# Patient Record
Sex: Male | Born: 1960 | Race: Black or African American | Hispanic: No | Marital: Married | Smoking: Never smoker
Health system: Southern US, Community
[De-identification: ages and names within clinical notes are randomized; demographics above are authoritative.]

## PROBLEM LIST (undated history)

## (undated) DIAGNOSIS — M549 Dorsalgia, unspecified: Secondary | ICD-10-CM

## (undated) DIAGNOSIS — G8929 Other chronic pain: Secondary | ICD-10-CM

## (undated) DIAGNOSIS — I350 Nonrheumatic aortic (valve) stenosis: Secondary | ICD-10-CM

## (undated) DIAGNOSIS — R519 Headache, unspecified: Secondary | ICD-10-CM

## (undated) DIAGNOSIS — E7800 Pure hypercholesterolemia, unspecified: Secondary | ICD-10-CM

## (undated) DIAGNOSIS — I10 Essential (primary) hypertension: Secondary | ICD-10-CM

## (undated) DIAGNOSIS — K219 Gastro-esophageal reflux disease without esophagitis: Secondary | ICD-10-CM

## (undated) DIAGNOSIS — G4730 Sleep apnea, unspecified: Secondary | ICD-10-CM

## (undated) DIAGNOSIS — I509 Heart failure, unspecified: Secondary | ICD-10-CM

## (undated) DIAGNOSIS — I209 Angina pectoris, unspecified: Secondary | ICD-10-CM

## (undated) DIAGNOSIS — R0600 Dyspnea, unspecified: Secondary | ICD-10-CM

## (undated) DIAGNOSIS — Z9289 Personal history of other medical treatment: Secondary | ICD-10-CM

## (undated) DIAGNOSIS — M1990 Unspecified osteoarthritis, unspecified site: Secondary | ICD-10-CM

## (undated) DIAGNOSIS — R51 Headache: Secondary | ICD-10-CM

## (undated) DIAGNOSIS — R0609 Other forms of dyspnea: Secondary | ICD-10-CM

## (undated) HISTORY — PX: TONSILLECTOMY: SUR1361

---

## 2002-04-04 HISTORY — PX: KNEE ARTHROSCOPY: SUR90

## 2009-12-31 ENCOUNTER — Emergency Department (HOSPITAL_COMMUNITY): Admission: EM | Admit: 2009-12-31 | Discharge: 2010-01-01 | Payer: Self-pay | Admitting: Emergency Medicine

## 2010-06-03 HISTORY — PX: MULTIPLE TOOTH EXTRACTIONS: SHX2053

## 2010-06-17 LAB — POCT I-STAT, CHEM 8
BUN: 14 mg/dL (ref 6–23)
Calcium, Ion: 1.23 mmol/L (ref 1.12–1.32)
Chloride: 107 meq/L (ref 96–112)
Creatinine, Ser: 0.8 mg/dL (ref 0.4–1.5)
Glucose, Bld: 99 mg/dL (ref 70–99)
HCT: 46 % (ref 39.0–52.0)
Hemoglobin: 15.6 g/dL (ref 13.0–17.0)
Potassium: 3.3 mEq/L — ABNORMAL LOW (ref 3.5–5.1)
Sodium: 142 meq/L (ref 135–145)
TCO2: 25 mmol/L (ref 0–100)

## 2010-08-03 DIAGNOSIS — G4730 Sleep apnea, unspecified: Secondary | ICD-10-CM

## 2010-08-03 HISTORY — DX: Sleep apnea, unspecified: G47.30

## 2010-09-10 ENCOUNTER — Ambulatory Visit (HOSPITAL_BASED_OUTPATIENT_CLINIC_OR_DEPARTMENT_OTHER): Payer: Medicaid Other | Attending: Internal Medicine

## 2010-09-10 DIAGNOSIS — G4733 Obstructive sleep apnea (adult) (pediatric): Secondary | ICD-10-CM | POA: Insufficient documentation

## 2010-09-11 DIAGNOSIS — G4733 Obstructive sleep apnea (adult) (pediatric): Secondary | ICD-10-CM

## 2010-09-12 NOTE — Procedures (Signed)
[name], [name]                  ACCOUNT NO.:  [id number]  MEDICAL RECORD NO.:  [id number]          PATIENT TYPE:  OUT  LOCATION:  SLEEP CENTER                 FACILITY:  [facility]  PHYSICIAN:  Clinton D. Maple Hudson, MD, FCCP, FACPDATE OF BIRTH:  DATE OF STUDY:  09/10/2010                           NOCTURNAL POLYSOMNOGRAM  REFERRING PHYSICIAN:  Fleet Contras, M.D.  INDICATION FOR STUDY:  Hypersomnia with sleep apnea.  EPWORTH SLEEPINESS SCORE:  9/24, BMI 31, weight 203 pounds, height 68 inches, neck 16.5 inches.  MEDICATIONS:  Charted and reviewed.  SLEEP ARCHITECTURE:  Total sleep time 326.5 minutes with sleep efficiency 89.8%.  Stage I was 5.1%, stage II 73.8%, stage III absent, REM 21.1% of total sleep time.  Sleep latency 32 minutes, REM latency 54.5 minutes, awake after sleep onset 6 minutes, arousal index 5.9.  BEDTIME MEDICATION:  None.  RESPIRATORY DATA:  Apnea/hypopnea index (AHI) 10.8 per hour.  A total of 59 events were scored including one obstructive apneas, 6 central apneas, 52 hypopneas.  Events were not positional.  REM AHI 25.2 per hour.  RDI 12.7 per hour.  There were insufficient numbers of early events to permit application of CPAP titration by split protocol requirements on this study night.  OXYGEN DATA:  Moderately loud snoring with oxygen desaturation to a nadir of 82% and a mean oxygen saturation through the study of 92% on room air.  CARDIAC DATA:  Normal sinus rhythm.  MOVEMENT-PARASOMNIA:  No significant movement disorder.  No bathroom trips.  IMPRESSIONS-RECOMMENDATIONS: 1. Mild obstructive sleep apnea/hypopnea syndrome, apnea/hypopnea     index 10.8 per hour with non-positional events, moderately loud     snoring, and oxygen desaturation to a nadir of 82% with mean of 92%     on room air through the study. 2. There were insufficient early events to meet requirements for     initiation of continuous positive airway pressure     split protocol  titration on the study night.  Consider return for a     dedicated continuous positive airway pressure titration study or     evaluate for alternative management as clinically indicated.     Clinton D. Maple Hudson, MD, [facility], FACP Diplomate, Biomedical engineer of Sleep Medicine Electronically Signed    CDY/MEDQ  D:  09/11/2010 11:48:08  T:  09/12/2010 00:49:12  Job:  [id number]

## 2011-03-05 HISTORY — PX: COLONOSCOPY: SHX174

## 2011-03-05 HISTORY — PX: UPPER GASTROINTESTINAL ENDOSCOPY: SHX188

## 2011-04-05 DIAGNOSIS — Z9289 Personal history of other medical treatment: Secondary | ICD-10-CM

## 2011-04-05 DIAGNOSIS — I350 Nonrheumatic aortic (valve) stenosis: Secondary | ICD-10-CM

## 2011-04-05 HISTORY — DX: Personal history of other medical treatment: Z92.89

## 2011-04-05 HISTORY — DX: Nonrheumatic aortic (valve) stenosis: I35.0

## 2011-08-26 ENCOUNTER — Emergency Department (HOSPITAL_COMMUNITY): Payer: Medicaid Other

## 2011-08-26 ENCOUNTER — Observation Stay (HOSPITAL_COMMUNITY)
Admission: EM | Admit: 2011-08-26 | Discharge: 2011-08-27 | Disposition: A | Discharge status: Home / Self Care | Payer: Medicaid Other | Attending: Family Medicine | Admitting: Family Medicine

## 2011-08-26 ENCOUNTER — Encounter (HOSPITAL_COMMUNITY): Payer: Self-pay | Admitting: Physical Medicine and Rehabilitation

## 2011-08-26 DIAGNOSIS — I10 Essential (primary) hypertension: Secondary | ICD-10-CM | POA: Insufficient documentation

## 2011-08-26 DIAGNOSIS — K219 Gastro-esophageal reflux disease without esophagitis: Secondary | ICD-10-CM

## 2011-08-26 DIAGNOSIS — Z9119 Patient's noncompliance with other medical treatment and regimen: Secondary | ICD-10-CM | POA: Insufficient documentation

## 2011-08-26 DIAGNOSIS — E785 Hyperlipidemia, unspecified: Secondary | ICD-10-CM

## 2011-08-26 DIAGNOSIS — Z91199 Patient's noncompliance with other medical treatment and regimen due to unspecified reason: Secondary | ICD-10-CM | POA: Insufficient documentation

## 2011-08-26 DIAGNOSIS — M109 Gout, unspecified: Secondary | ICD-10-CM | POA: Insufficient documentation

## 2011-08-26 DIAGNOSIS — R0789 Other chest pain: Principal | ICD-10-CM | POA: Insufficient documentation

## 2011-08-26 DIAGNOSIS — R079 Chest pain, unspecified: Secondary | ICD-10-CM | POA: Diagnosis present

## 2011-08-26 HISTORY — DX: Pure hypercholesterolemia, unspecified: E78.00

## 2011-08-26 HISTORY — DX: Essential (primary) hypertension: I10

## 2011-08-26 HISTORY — DX: Dyspnea, unspecified: R06.00

## 2011-08-26 HISTORY — DX: Sleep apnea, unspecified: G47.30

## 2011-08-26 HISTORY — DX: Other forms of dyspnea: R06.09

## 2011-08-26 HISTORY — DX: Angina pectoris, unspecified: I20.9

## 2011-08-26 LAB — POCT I-STAT TROPONIN I: Troponin i, poc: 0 ng/mL (ref 0.00–0.08)

## 2011-08-26 LAB — COMPREHENSIVE METABOLIC PANEL
AST: 27 U/L (ref 0–37)
Albumin: 3.7 g/dL (ref 3.5–5.2)
BUN: 9 mg/dL (ref 6–23)
Calcium: 9.7 mg/dL (ref 8.4–10.5)
Creatinine, Ser: 0.83 mg/dL (ref 0.50–1.35)
Total Bilirubin: 0.4 mg/dL (ref 0.3–1.2)
Total Protein: 7.2 g/dL (ref 6.0–8.3)

## 2011-08-26 LAB — CBC
HCT: 42 % (ref 39.0–52.0)
Hemoglobin: 14.4 g/dL (ref 13.0–17.0)
MCH: 24.7 pg — ABNORMAL LOW (ref 26.0–34.0)
MCV: 72 fL — ABNORMAL LOW (ref 78.0–100.0)
Platelets: 185 10*3/uL (ref 150–400)
RBC: 5.83 MIL/uL — ABNORMAL HIGH (ref 4.22–5.81)
WBC: 6 10*3/uL (ref 4.0–10.5)

## 2011-08-26 MED ORDER — NITROGLYCERIN 0.4 MG SL SUBL
0.4000 mg | SUBLINGUAL_TABLET | SUBLINGUAL | Status: DC | PRN
  Administered 2011-08-26: 0.4 mg via SUBLINGUAL

## 2011-08-26 MED ORDER — MORPHINE SULFATE 4 MG/ML IJ SOLN: 4.0000 mg | Freq: Once | INTRAMUSCULAR | Status: DC

## 2011-08-26 MED ORDER — ASPIRIN 81 MG PO CHEW
324.0000 mg | CHEWABLE_TABLET | Freq: Once | ORAL | Status: AC
  Administered 2011-08-26: 324 mg via ORAL
  Filled 2011-08-26: qty 3
  Filled 2011-08-26: qty 1

## 2011-08-26 NOTE — ED Provider Notes (Signed)
Medical screening examination/treatment/procedure(s) were conducted as a shared visit with non-physician practitioner(s) and myself.  I personally evaluated the patient during the encounter On my exam this patient was feeling better. I saw the ECG and agree with the interpretation. The patient was admitted for further E/M.   Gerhard Munch, MD 08/26/11 [phone]

## 2011-08-26 NOTE — ED Notes (Addendum)
Pt. Reports SOB and chest pain x 1 day. "started at approx 1300. Pt reports N/V x1 day with 2 episodes. Pt. States chest pain feels like "pressure" with sharpness on the left side.  Pain is unchanged with palpation. Pt. Has hxt of hypertension. A.O. X 4 . Pt reports muscle spams "through whole body on Monday".

## 2011-08-26 NOTE — ED Notes (Signed)
Pt presents to department for evaluation of midsternal chest pain radiating to both arms. Onset this afternoon. 8/10 pain at the time, increases with deep breathing. Describes pain as "pressure" sensation. Also states SOB, N/V and diaphoresis. Pt is alert and oriented x4.

## 2011-08-26 NOTE — ED Provider Notes (Signed)
History     CSN: [id number]  Arrival date & time 08/26/11  1620   First MD Initiated Contact with Patient 08/26/11 1713     5:48 PM HPI Reports chest pain that began approximately one hour and 40 minutes ago. States pain woke him up from sleep at 1 PM. Describes pain as a substernal left sided chest pressure with radiation straight to his back. Associated with nausea, vomiting, shortness of breath, diaphoresis. Reports a history of hypertension. Denies history of CAD, smoking, hyperlipidemia, early family history of heart disease, blood clots, recent travel, surgery. Patient is a 51 y.o. male presenting with chest pain. The history is provided by the patient.  Chest Pain The chest pain began 3 - 5 hours ago. Chest pain occurs constantly. The chest pain is unchanged. The severity of the pain is severe. The quality of the pain is described as pressure-like. The pain radiates to the upper back. Primary symptoms include shortness of breath, nausea and vomiting. Pertinent negatives for primary symptoms include no fever, no fatigue, no syncope, no cough, no wheezing, no palpitations, no abdominal pain, no dizziness and no altered mental status.  Associated symptoms include diaphoresis.  Pertinent negatives for associated symptoms include no claudication, no lower extremity edema, no near-syncope and no numbness. He tried nothing for the symptoms. Risk factors include male gender.  His past medical history is significant for hypertension.  Pertinent negatives for past medical history include no arrhythmia, no CHF, no diabetes, no DVT, no hyperlipidemia, no MI and no PE.  Pertinent negatives for family medical history include: no early MI in family.     Past Medical History  Diagnosis Date  . Hypertension     No past surgical history on file.  History reviewed. No pertinent family history.  History  Substance Use Topics  . Smoking status: Never Smoker   . Smokeless tobacco: Not on file  .  Alcohol Use: No      Review of Systems  Constitutional: Positive for diaphoresis. Negative for fever and fatigue.  Respiratory: Positive for shortness of breath. Negative for cough and wheezing.   Cardiovascular: Positive for chest pain. Negative for palpitations, claudication, syncope and near-syncope.  Gastrointestinal: Positive for nausea and vomiting. Negative for abdominal pain.  Neurological: Negative for dizziness and numbness.  Psychiatric/Behavioral: Negative for altered mental status.  All other systems reviewed and are negative.    Allergies  Latex  Home Medications   Current Outpatient Rx  Name Route Sig Dispense Refill  . ALLOPURINOL 300 MG PO TABS Oral Take 300 mg by mouth daily.    Marland Kitchen AMLODIPINE BESYLATE 10 MG PO TABS Oral Take 10 mg by mouth daily.    Marland Kitchen LISINOPRIL-HYDROCHLOROTHIAZIDE 20-25 MG PO TABS Oral Take 1 tablet by mouth daily.    Marland Kitchen OMEPRAZOLE 20 MG PO CPDR Oral Take 40 mg by mouth daily.      BP 160/100  Pulse 72  Temp(Src) 98.1 F (36.7 C) (Oral)  Resp 16  SpO2 97%  Physical Exam  Constitutional: He is oriented to person, place, and time. He appears well-developed and well-nourished.  HENT:  Head: Normocephalic and atraumatic.  Eyes: Conjunctivae are normal. Pupils are equal, round, and reactive to light.  Neck: Normal range of motion. Neck supple.  Cardiovascular: Normal rate, regular rhythm and normal heart sounds.   Pulmonary/Chest: Effort normal and breath sounds normal.  Abdominal: Soft. Bowel sounds are normal.  Neurological: He is alert and oriented to person, place, and time.  Skin: Skin is warm and dry. No rash noted. No erythema. No pallor.  Psychiatric: He has a normal mood and affect. His behavior is normal.    ED Course  Procedures  Results for orders placed during the hospital encounter of 08/26/11  COMPREHENSIVE METABOLIC PANEL      Component Value Range   Sodium 140  135 - 145 (mEq/L)   Potassium 3.7  3.5 - 5.1 (mEq/L)    Chloride 104  96 - 112 (mEq/L)   CO2 24  19 - 32 (mEq/L)   Glucose, Bld 85  70 - 99 (mg/dL)   BUN 9  6 - 23 (mg/dL)   Creatinine, Ser 1.61  0.50 - 1.35 (mg/dL)   Calcium 9.7  8.4 - 09.6 (mg/dL)   Total Protein 7.2  6.0 - 8.3 (g/dL)   Albumin 3.7  3.5 - 5.2 (g/dL)   AST 27  0 - 37 (U/L)   ALT 37  0 - 53 (U/L)   Alkaline Phosphatase 66  39 - 117 (U/L)   Total Bilirubin 0.4  0.3 - 1.2 (mg/dL)   GFR calc non Af Amer >90  >90 (mL/min)   GFR calc Af Amer >90  >90 (mL/min)  CBC      Component Value Range   WBC 6.0  4.0 - 10.5 (K/uL)   RBC 5.83 (*) 4.22 - 5.81 (MIL/uL)   Hemoglobin 14.4  13.0 - 17.0 (g/dL)   HCT 04.5  40.9 - 81.1 (%)   MCV 72.0 (*) 78.0 - 100.0 (fL)   MCH 24.7 (*) 26.0 - 34.0 (pg)   MCHC 34.3  30.0 - 36.0 (g/dL)   RDW 91.4  78.2 - 95.6 (%)   Platelets 185  150 - 400 (K/uL)  POCT I-STAT TROPONIN I      Component Value Range   Troponin i, poc 0.00  0.00 - 0.08 (ng/mL)   Comment 3            Dg Chest 2 View  08/26/2011  *RADIOLOGY REPORT*  Clinical Data: Chest pain, shortness of breath, weakness, dry cough, hypertension  CHEST - 2 VIEW  Comparison: None.  Findings: Normal heart size, mediastinal contours, and pulmonary vascularity. Lungs clear. Bones unremarkable. No pneumothorax.  IMPRESSION: No acute abnormalities.  Original Report Authenticated By: Lollie Marrow, M.D.   Dg Chest 2v Repeat Same Day  08/26/2011  *RADIOLOGY REPORT*  Clinical Data: Chest pain, shortness of breath  CHEST - 2 VIEW SAME DAY  Comparison: 08/26/2011 at 1648 hours  Findings: Lungs are clear. No pleural effusion or pneumothorax.  Cardiomediastinal silhouette is within normal limits.  Visualized osseous structures are within normal limits.  IMPRESSION: Normal chest radiographs.  Original Report Authenticated By: Charline Bills, M.D.    ED ECG REPORT   Date: 08/26/2011  EKG Time: 10:05 PM  Rate: 80   Rhythm: normal sinus rhythm,  there are no previous tracings available for comparison  Axis:  nml  Intervals:none  ST&T Change: early repol    MDM   10:03 PM Patient's pain is now a 0/10. Headache from nitroglycerin has resolved. The patient's chest pain was concerning for cardiac chest pain,  will admit for chest pain observation due to to lack for space in chest pain protocol in the CDU. Discussed with patient and family agree with plan  11:17 PM Spoke with Dr. Joneen Roach, [facility]. She will see the patient for admission of Chest pain.        Thomasene Lot, PA-C 08/26/11  2318 

## 2011-08-26 NOTE — ED Notes (Signed)
Patient is resting comfortably. 

## 2011-08-27 ENCOUNTER — Encounter (HOSPITAL_COMMUNITY): Payer: Self-pay | Admitting: General Practice

## 2011-08-27 DIAGNOSIS — E785 Hyperlipidemia, unspecified: Secondary | ICD-10-CM

## 2011-08-27 DIAGNOSIS — K219 Gastro-esophageal reflux disease without esophagitis: Secondary | ICD-10-CM

## 2011-08-27 DIAGNOSIS — Z9119 Patient's noncompliance with other medical treatment and regimen: Secondary | ICD-10-CM

## 2011-08-27 DIAGNOSIS — M109 Gout, unspecified: Secondary | ICD-10-CM

## 2011-08-27 DIAGNOSIS — Z91199 Patient's noncompliance with other medical treatment and regimen due to unspecified reason: Secondary | ICD-10-CM

## 2011-08-27 DIAGNOSIS — R079 Chest pain, unspecified: Secondary | ICD-10-CM | POA: Diagnosis present

## 2011-08-27 DIAGNOSIS — I10 Essential (primary) hypertension: Secondary | ICD-10-CM

## 2011-08-27 LAB — BASIC METABOLIC PANEL
BUN: 10 mg/dL (ref 6–23)
CO2: 22 mEq/L (ref 19–32)
Chloride: 102 mEq/L (ref 96–112)
Creatinine, Ser: 0.8 mg/dL (ref 0.50–1.35)

## 2011-08-27 LAB — LIPID PANEL
HDL: 29 mg/dL — ABNORMAL LOW (ref >39)
LDL Cholesterol: 114 mg/dL — ABNORMAL HIGH (ref 0–99)
Triglycerides: 241 mg/dL — ABNORMAL HIGH (ref <150)

## 2011-08-27 LAB — CBC
HCT: 41.6 % (ref 39.0–52.0)
HCT: 42.2 % (ref 39.0–52.0)
MCH: 24.2 pg — ABNORMAL LOW (ref 26.0–34.0)
MCHC: 34.9 g/dL (ref 30.0–36.0)
MCHC: 34.1 g/dL (ref 30.0–36.0)
MCV: 71 fL — ABNORMAL LOW (ref 78.0–100.0)
MCV: 70.9 fL — ABNORMAL LOW (ref 78.0–100.0)
RDW: 13.4 % (ref 11.5–15.5)
RDW: 13.5 % (ref 11.5–15.5)

## 2011-08-27 LAB — CREATININE, SERUM: GFR calc non Af Amer: >90 mL/min (ref >90)

## 2011-08-27 LAB — TROPONIN I: Troponin I: <0.3 ng/mL (ref <0.30)

## 2011-08-27 MED ORDER — ONDANSETRON HCL 4 MG/2ML IJ SOLN: 4.0000 mg | Freq: Four times a day (QID) | INTRAMUSCULAR | Status: DC | PRN

## 2011-08-27 MED ORDER — SODIUM CHLORIDE 0.9 % IJ SOLN: 3.0000 mL | INTRAMUSCULAR | Status: DC | PRN

## 2011-08-27 MED ORDER — PANTOPRAZOLE SODIUM 40 MG PO TBEC
40.0000 mg | DELAYED_RELEASE_TABLET | Freq: Every day | ORAL | Status: DC
  Administered 2011-08-27: 40 mg via ORAL

## 2011-08-27 MED ORDER — LISINOPRIL 20 MG PO TABS
20.0000 mg | ORAL_TABLET | Freq: Every day | ORAL | Status: DC
  Administered 2011-08-27: 20 mg via ORAL
  Filled 2011-08-27: qty 1

## 2011-08-27 MED ORDER — ONDANSETRON HCL 4 MG PO TABS: 4.0000 mg | ORAL_TABLET | Freq: Four times a day (QID) | ORAL | Status: DC | PRN

## 2011-08-27 MED ORDER — ALUM & MAG HYDROXIDE-SIMETH 200-200-20 MG/5ML PO SUSP: 30.0000 mL | Freq: Four times a day (QID) | ORAL | Status: DC | PRN

## 2011-08-27 MED ORDER — LISINOPRIL-HYDROCHLOROTHIAZIDE 20-25 MG PO TABS: 1.0000 | ORAL_TABLET | Freq: Every day | ORAL | Status: DC

## 2011-08-27 MED ORDER — ENOXAPARIN SODIUM 40 MG/0.4ML ~~LOC~~ SOLN
40.0000 mg | SUBCUTANEOUS | Status: DC
  Filled 2011-08-27: qty 0.4

## 2011-08-27 MED ORDER — METOPROLOL TARTRATE 1 MG/ML IV SOLN: 5.0000 mg | Freq: Four times a day (QID) | INTRAVENOUS | Status: DC | PRN

## 2011-08-27 MED ORDER — PANTOPRAZOLE SODIUM 40 MG PO TBEC: 40.0000 mg | DELAYED_RELEASE_TABLET | Freq: Every day | ORAL | Status: DC

## 2011-08-27 MED ORDER — ASPIRIN 325 MG PO TABS
325.0000 mg | ORAL_TABLET | Freq: Every day | ORAL | Status: DC
  Administered 2011-08-27: 325 mg via ORAL
  Filled 2011-08-27: qty 1

## 2011-08-27 MED ORDER — CLONIDINE HCL 0.1 MG PO TABS
0.1000 mg | ORAL_TABLET | Freq: Four times a day (QID) | ORAL | Status: DC | PRN
  Filled 2011-08-27: qty 1

## 2011-08-27 MED ORDER — AMLODIPINE BESYLATE 10 MG PO TABS
10.0000 mg | ORAL_TABLET | Freq: Every day | ORAL | Status: DC
  Administered 2011-08-27: 10 mg via ORAL
  Filled 2011-08-27: qty 1

## 2011-08-27 MED ORDER — SODIUM CHLORIDE 0.9 % IJ SOLN
3.0000 mL | Freq: Two times a day (BID) | INTRAMUSCULAR | Status: DC
  Administered 2011-08-27: 3 mL via INTRAVENOUS

## 2011-08-27 MED ORDER — ALLOPURINOL 300 MG PO TABS
300.0000 mg | ORAL_TABLET | Freq: Every day | ORAL | Status: DC
  Administered 2011-08-27: 300 mg via ORAL
  Filled 2011-08-27: qty 1

## 2011-08-27 MED ORDER — CLONIDINE HCL 0.1 MG PO TABS: 0.1000 mg | ORAL_TABLET | Freq: Two times a day (BID) | ORAL | Status: DC

## 2011-08-27 MED ORDER — ACETAMINOPHEN 325 MG PO TABS: 650.0000 mg | ORAL_TABLET | Freq: Four times a day (QID) | ORAL | Status: DC | PRN

## 2011-08-27 MED ORDER — HYDROCODONE-ACETAMINOPHEN 5-325 MG PO TABS: 1.0000 | ORAL_TABLET | ORAL | Status: DC | PRN

## 2011-08-27 MED ORDER — HYDROCHLOROTHIAZIDE 25 MG PO TABS
25.0000 mg | ORAL_TABLET | Freq: Every day | ORAL | Status: DC
  Administered 2011-08-27: 25 mg via ORAL
  Filled 2011-08-27: qty 1

## 2011-08-27 MED ORDER — ACETAMINOPHEN 650 MG RE SUPP: 650.0000 mg | Freq: Four times a day (QID) | RECTAL | Status: DC | PRN

## 2011-08-27 MED ORDER — ASPIRIN 325 MG PO TABS: 325.0000 mg | ORAL_TABLET | Freq: Every day | ORAL | Status: DC

## 2011-08-27 NOTE — Progress Notes (Signed)
[facility] Progress Note  08/27/2011  Subjective: Pt reports he is feeling much better.  He says that he has been having significant acid reflux that is not well controlled  Objective:  Vital signs in last 24 hours: Filed Vitals:   08/27/11 0053 08/27/11 0110 08/27/11 0600 08/27/11 0948  BP:  137/100 162/96 158/107  Pulse:  64 55   Temp: 98.8 F (37.1 C) 98.6 F (37 C) 98.2 F (36.8 C)   TempSrc: Oral     Resp:  14 15   Height:  5\' 9"  (1.753 m)    Weight:  87.6 kg (193 lb 2 oz)    SpO2:  96% 98%    Weight change:  No intake or output data in the 24 hours ending 08/27/11 1117 No results found for this basename: HGBA1C   Lab Results  Component Value Date   LDLCALC 114* 08/27/2011   CREATININE 0.80 08/27/2011    Review of Systems As above, otherwise all reviewed and reported negative  Physical Exam General - awake, no distress, cooperative HEENT - NCAT, MMM Lungs - BBS, CTA CV - normal s1, s2 sounds Abd - soft, nondistended, no masses, nontender Ext - no C/C/E  Lab Results: Results for orders placed during the hospital encounter of 08/26/11 (from the past 24 hour(s))  COMPREHENSIVE METABOLIC PANEL     Status: Normal   Collection Time   08/26/11  5:41 PM      Component Value Range   Sodium 140  135 - 145 (mEq/L)   Potassium 3.7  3.5 - 5.1 (mEq/L)   Chloride 104  96 - 112 (mEq/L)   CO2 24  19 - 32 (mEq/L)   Glucose, Bld 85  70 - 99 (mg/dL)   BUN 9  6 - 23 (mg/dL)   Creatinine, Ser 4.09  0.50 - 1.35 (mg/dL)   Calcium 9.7  8.4 - 81.1 (mg/dL)   Total Protein 7.2  6.0 - 8.3 (g/dL)   Albumin 3.7  3.5 - 5.2 (g/dL)   AST 27  0 - 37 (U/L)   ALT 37  0 - 53 (U/L)   Alkaline Phosphatase 66  39 - 117 (U/L)   Total Bilirubin 0.4  0.3 - 1.2 (mg/dL)   GFR calc non Af Amer >90  >90 (mL/min)   GFR calc Af Amer >90  >90 (mL/min)  CBC     Status: Abnormal   Collection Time   08/26/11  5:41 PM      Component Value Range   WBC 6.0  4.0 - 10.5 (K/uL)   RBC 5.83 (*)  4.22 - 5.81 (MIL/uL)   Hemoglobin 14.4  13.0 - 17.0 (g/dL)   HCT 91.4  78.2 - 95.6 (%)   MCV 72.0 (*) 78.0 - 100.0 (fL)   MCH 24.7 (*) 26.0 - 34.0 (pg)   MCHC 34.3  30.0 - 36.0 (g/dL)   RDW 21.3  08.6 - 57.8 (%)   Platelets 185  150 - 400 (K/uL)  POCT I-STAT TROPONIN I     Status: Normal   Collection Time   08/26/11  8:06 PM      Component Value Range   Troponin i, poc 0.00  0.00 - 0.08 (ng/mL)   Comment 3           POCT I-STAT TROPONIN I     Status: Normal   Collection Time   08/26/11 11:38 PM      Component Value Range   Troponin i, poc 0.00  0.00 - 0.08 (ng/mL)   Comment 3           TROPONIN I     Status: Normal   Collection Time   08/27/11  2:40 AM      Component Value Range   Troponin I <0.30  <0.30 (ng/mL)  CBC     Status: Abnormal   Collection Time   08/27/11  2:40 AM      Component Value Range   WBC 6.4  4.0 - 10.5 (K/uL)   RBC 5.86 (*) 4.22 - 5.81 (MIL/uL)   Hemoglobin 14.5  13.0 - 17.0 (g/dL)   HCT 40.9  81.1 - 91.4 (%)   MCV 71.0 (*) 78.0 - 100.0 (fL)   MCH 24.7 (*) 26.0 - 34.0 (pg)   MCHC 34.9  30.0 - 36.0 (g/dL)   RDW 78.2  95.6 - 21.3 (%)   Platelets 188  150 - 400 (K/uL)  CREATININE, SERUM     Status: Normal   Collection Time   08/27/11  2:40 AM      Component Value Range   Creatinine, Ser 0.83  0.50 - 1.35 (mg/dL)   GFR calc non Af Amer >90  >90 (mL/min)   GFR calc Af Amer >90  >90 (mL/min)  LIPID PANEL     Status: Abnormal   Collection Time   08/27/11  5:35 AM      Component Value Range   Cholesterol 191  0 - 200 (mg/dL)   Triglycerides 086 (*) <150 (mg/dL)   HDL 29 (*) >57 (mg/dL)   Total CHOL/HDL Ratio 6.6     VLDL 48 (*) 0 - 40 (mg/dL)   LDL Cholesterol 846 (*) 0 - 99 (mg/dL)  BASIC METABOLIC PANEL     Status: Normal   Collection Time   08/27/11  5:35 AM      Component Value Range   Sodium 137  135 - 145 (mEq/L)   Potassium 3.9  3.5 - 5.1 (mEq/L)   Chloride 102  96 - 112 (mEq/L)   CO2 22  19 - 32 (mEq/L)   Glucose, Bld 85  70 - 99 (mg/dL)    BUN 10  6 - 23 (mg/dL)   Creatinine, Ser 9.62  0.50 - 1.35 (mg/dL)   Calcium 9.7  8.4 - 95.2 (mg/dL)   GFR calc non Af Amer >90  >90 (mL/min)   GFR calc Af Amer >90  >90 (mL/min)  CBC     Status: Abnormal   Collection Time   08/27/11  5:35 AM      Component Value Range   WBC 6.5  4.0 - 10.5 (K/uL)   RBC 5.95 (*) 4.22 - 5.81 (MIL/uL)   Hemoglobin 14.4  13.0 - 17.0 (g/dL)   HCT 84.1  32.4 - 40.1 (%)   MCV 70.9 (*) 78.0 - 100.0 (fL)   MCH 24.2 (*) 26.0 - 34.0 (pg)   MCHC 34.1  30.0 - 36.0 (g/dL)   RDW 02.7  25.3 - 66.4 (%)   Platelets 184  150 - 400 (K/uL)  TROPONIN I     Status: Normal   Collection Time   08/27/11  8:19 AM      Component Value Range   Troponin I <0.30  <0.30 (ng/mL)    Micro Results: No results found for this or any previous visit (from the past 240 hour(s)).  Medications:  Scheduled Meds:   . allopurinol  300 mg Oral Daily  . amLODipine  10 mg Oral  Daily  . aspirin  324 mg Oral Once  . aspirin  325 mg Oral Daily  . enoxaparin  40 mg Subcutaneous Q24H  . lisinopril  20 mg Oral Daily   And  . hydrochlorothiazide  25 mg Oral Daily  .  morphine injection  4 mg Intravenous Once  . pantoprazole  40 mg Oral Q1200  . sodium chloride  3 mL Intravenous Q12H  . DISCONTD: lisinopril-hydrochlorothiazide  1 tablet Oral Daily   Continuous Infusions:  PRN Meds:.acetaminophen, acetaminophen, alum & mag hydroxide-simeth, cloNIDine, HYDROcodone-acetaminophen, metoprolol, nitroGLYCERIN, ondansetron (ZOFRAN) IV, ondansetron, sodium chloride  Assessment/Plan: Atypical CP  - complete rule out, waiting on last set of enzymes  GERD - continue protonix  HTN - continue meds  Possible DC later today   LOS: 1 day   Jaelen Soth 08/27/2011, 11:17 AM   Cleora Fleet, MD, CDE, FAAFP [facility] [place], [place]  [phone]

## 2011-08-27 NOTE — Discharge Summary (Signed)
Physician Discharge Summary  Patient ID: [name] MRN: [id number] DOB/AGE: 06/19/1960 51 y.o.  Admit date: 08/26/2011 Discharge date: 08/27/2011  Discharge Diagnoses:   *Chest pain  History of noncompliance with medical treatment  Hypertension  Dyslipidemia  GERD (gastroesophageal reflux disease)  Gout  Discharged Condition: good  Hospital Course:  CHEST PAIN - Pt had 3 sets of cardiac enzymes and ruled out for myocardial infarction.   HYPERLIPIDEMIA - pt had not been taking his statin meds regularly, found to have LDL of 114, triglycerides 409, HDL 29 - encouraged to take statin med and follow up with PCP  HTN - suboptimally controlled, added clonidine 0.1 mg po BID, resume home BP meds, follow up with PCP.   GERD - significant symptoms, suboptimally controlled (poor compliance with omeprazole) and pt can't afford nexium which worked much better.. Recommended take omeprazole bid and call and get appointment with [facility] for follow up.  Pt had an EGD 1 year ago but he had been on nexium and symptoms much better controlled at that time.    GOUT - stable  Discharge Exam: Blood pressure 145/95, pulse 72, temperature 98.3 F (36.8 C), temperature source Oral, resp. rate 18, height 5\' 9"  (1.753 m), weight 87.6 kg (193 lb 2 oz), SpO2 97.00%.   Disposition: Home with wife  Discharge Orders    Future Orders Please Complete By Expires   Increase activity slowly        Medication List  As of 08/27/2011  3:00 PM   STOP taking these medications         omeprazole 20 MG capsule         TAKE these medications         allopurinol 300 MG tablet   Commonly known as: ZYLOPRIM   Take 300 mg by mouth daily.      amLODipine 10 MG tablet   Commonly known as: NORVASC   Take 10 mg by mouth daily.      aspirin 325 MG tablet   Take 1 tablet (325 mg total) by mouth daily.      cloNIDine 0.1 MG tablet   Commonly known as: CATAPRES   Take 1 tablet (0.1 mg total) by mouth 2 (two)  times daily.      lisinopril-hydrochlorothiazide 20-25 MG per tablet   Commonly known as: PRINZIDE,ZESTORETIC   Take 1 tablet by mouth daily.      pantoprazole 40 MG tablet   Commonly known as: PROTONIX   Take 1 tablet (40 mg total) by mouth daily at 12 noon.           Follow-up Information    Follow up with AVBUERE,EDWIN A, MD. Schedule an appointment as soon as possible for a visit in 1 week. (HOSPITAL FOLLOW UP  BP CHECK)    Contact information:   [address] [place] [address] [phone]       Follow up with [facility] PLEASE CALL AND MAKE APPOINTMENT. Schedule an appointment as soon as possible for a visit in 2 weeks. (FOLLOW UP ACID REFLUX)         35 mins spent preparing discharge, writing work note, counseling patient.   SignedStandley Dakins MD 08/27/2011, 3:00 PM PAGER [phone]

## 2011-08-27 NOTE — H&P (Signed)
PCP:   Dr. Mitzie Na   Chief Complaint:  Chest pains   HPI: This is a 51 year old gentleman who comes in with complaints of bilateral chest pains which radiated down to the bilateral arms. He reports chest tightness, associated dizziness, lightheadedness, diaphoresis, nausea, vomiting, shortness of breath and palpitations. He reports the pain is worse with movements. He also reports back pain.  Pain in air resolve with morphine. He has a 84-month-old daughter whom he lifts frequently and a very physically strenuous job. He additionally reports a cough present for the last 2 years which he believes is related to his lisinopril, he states when doses is decrease the cough resolves. He has severe GERD and had a recent EGD and colonoscopy both of which were negative. Patient has hypertension and dyslipidemia, he is noncompliant with medication. The patient does have a history of recurrent chest pains, he reports his last stress test and left heart cath was done approximately 2 and half years ago both of which were negative. He had a prior heart cath approximately 6 years ago also negative. The patient does not have a significant family history of coronary artery disease, his uncle that died of congestive heart failure in his 11s. Here in the ER patient's blood pressures is elevated. He does admit to noncompliance with medication. History were provided both by patient and his wife. Patient's history is less reliable and reluctantly given. His wife is more straightforward.   Review of Systems: Positives bolded   anorexia, fever, weight loss,, vision loss, decreased hearing, hoarseness, chest pain, syncope, dyspnea on exertion, peripheral edema, balance deficits, hemoptysis, abdominal pain, melena, hematochezia, severe indigestion/heartburn, hematuria, incontinence, genital sores, muscle weakness, suspicious skin lesions, transient blindness, difficulty walking, depression, unusual weight change, abnormal bleeding,  enlarged lymph nodes, angioedema, and breast masses.  Past Medical History: Past Medical History  Diagnosis Date  . Hypertension   . High cholesterol   . Heart murmur     "leaky valve"  . Angina   . Exertional dyspnea   . Sleep apnea 08/2010    "not required to wear mask"  . Gout     "sometimes flares up even w/Allupurinol"   Past Surgical History  Procedure Date  . Upper gastrointestinal endoscopy 03/2011  . Colonoscopy 03/2011  . Knee arthroscopy 2004    "right; w/ligament repair in kneecap"  . Multiple tooth extractions 06/2010    full mouth  . Tonsillectomy     "I was a kid"    Medications: Prior to Admission medications   Medication Sig Start Date End Date Taking? Authorizing Provider  allopurinol (ZYLOPRIM) 300 MG tablet Take 300 mg by mouth daily.   Yes Historical Provider, MD  amLODipine (NORVASC) 10 MG tablet Take 10 mg by mouth daily.   Yes Historical Provider, MD  lisinopril-hydrochlorothiazide (PRINZIDE,ZESTORETIC) 20-25 MG per tablet Take 1 tablet by mouth daily.   Yes Historical Provider, MD  omeprazole (PRILOSEC) 20 MG capsule Take 40 mg by mouth daily.   Yes Historical Provider, MD    Allergies:   Allergies  Allergen Reactions  . Latex Hives    Social History:  reports that he quit smoking about 35 years ago. He has never used smokeless tobacco. He reports that he does not drink alcohol or use illicit drugs.  Family History: Family History  Problem Relation Age of Onset  . Hypertension      Physical Exam: Filed Vitals:   08/26/11 1634 08/26/11 1735 08/26/11 1857 08/26/11 2239  BP: 172/118  160/100 173/102 178/121  Pulse:  72 70 69  Temp:  98.4 F (36.9 C)    TempSrc:  Oral    Resp:   20 18  SpO2:  97% 99% 100%    General:  Alert and oriented times three, well developed and nourished, no acute distress Eyes: PERRLA, pink conjunctiva, no scleral icterus ENT: Moist oral mucosa, neck supple, no thyromegaly Lungs: clear to ascultation, no  wheeze, no crackles, no use of accessory muscles Cardiovascular: regular rate and rhythm, no regurgitation, no gallops, no murmurs. No carotid bruits, no JVD Abdomen: soft, positive BS, non-tender, non-distended, no organomegaly, not an acute abdomen GU: not examined Neuro: CN II - XII grossly intact, sensation intact Musculoskeletal: strength 5/5 all extremities, no clubbing, cyanosis or edema, no reproducible chest wall pain elicited [but patient is reluctant to answer positively to any questions]  Skin: no rash, no subcutaneous crepitation, no decubitus Psych: appropriate patient   Labs on Admission:   [facility] 08/26/11 1741  NA 140  K 3.7  CL 104  CO2 24  GLUCOSE 85  BUN 9  CREATININE 0.83  CALCIUM 9.7  MG --  PHOS --    Basename 08/26/11 1741  AST 27  ALT 37  ALKPHOS 66  BILITOT 0.4  PROT 7.2  ALBUMIN 3.7   No results found for this basename: LIPASE:2,AMYLASE:2 in the last 72 hours  Basename 08/26/11 1741  WBC 6.0  NEUTROABS --  HGB 14.4  HCT 42.0  MCV 72.0*  PLT 185   No results found for this basename: CKTOTAL:3,CKMB:3,CKMBINDEX:3,TROPONINI:3 in the last 72 hours No components found with this basename: POCBNP:3 No results found for this basename: DDIMER:2 in the last 72 hours No results found for this basename: HGBA1C:2 in the last 72 hours No results found for this basename: CHOL:2,HDL:2,LDLCALC:2,TRIG:2,CHOLHDL:2,LDLDIRECT:2 in the last 72 hours No results found for this basename: TSH,T4TOTAL,FREET3,T3FREE,THYROIDAB in the last 72 hours No results found for this basename: VITAMINB12:2,FOLATE:2,FERRITIN:2,TIBC:2,IRON:2,RETICCTPCT:2 in the last 72 hours  Micro Results: No results found for this or any previous visit (from the past 240 hour(s)).   Radiological Exams on Admission: Dg Chest 2 View  08/26/2011  *RADIOLOGY REPORT*  Clinical Data: Chest pain, shortness of breath, weakness, dry cough, hypertension  CHEST - 2 VIEW  Comparison: None.  Findings:  Normal heart size, mediastinal contours, and pulmonary vascularity. Lungs clear. Bones unremarkable. No pneumothorax.  IMPRESSION: No acute abnormalities.  Original Report Authenticated By: Lollie Marrow, M.D.   Dg Chest 2v Repeat Same Day  08/26/2011  *RADIOLOGY REPORT*  Clinical Data: Chest pain, shortness of breath  CHEST - 2 VIEW SAME DAY  Comparison: 08/26/2011 at 1648 hours  Findings: Lungs are clear. No pleural effusion or pneumothorax.  Cardiomediastinal silhouette is within normal limits.  Visualized osseous structures are within normal limits.  IMPRESSION: Normal chest radiographs.  Original Report Authenticated By: Charline Bills, M.D.    EKG: Normal sinus rhythm, no ST segment changes  Assessment/Plan Present on Admission:  .Chest pain Admit to observation on telemetry Very atypical pain, doubt cardiac We'll cycle cardiac enzymes and obtain lipid panel in the a.m. Discussion had regarding medication noncompliance Aspirin and nitroglycerin ordered Severe GERD  Patient Prilosec as outpatient which is effective. Nexium more effective but not covered by insurance formulary Patient's reason for the recent EGD and colonoscopy which she reports as negative Protonix started Hypertension uncontrolled Resume home medications and when necessary medications started. Gout Dyslipidemia Gout Hypertension As stated in general medication noncompliance. On  medication restarted  Full code DVT prophylaxis T9/Dr. Delene Loll, Nelson Julson 08/27/2011, 12:33 AM

## 2011-08-27 NOTE — Discharge Instructions (Signed)
Chest Pain (Nonspecific) Chest pain has many causes. Your pain could be caused by something serious, such as a heart attack or a blood clot in the lungs. It could also be caused by something less serious, such as a chest bruise or a virus. Follow up with your doctor. More lab tests or other studies may be needed to find the cause of your pain. Most of the time, nonspecific chest pain will improve within 2 to 3 days of rest and mild pain medicine. HOME CARE  For chest bruises, you may put ice on the sore area for 15 to 20 minutes, 3 to 4 times a day. Do this only if it makes you or your child feel better.   Put ice in a plastic bag.   Place a towel between the skin and the bag.   Rest for the next 2 to 3 days.   Go back to work if the pain improves.   See your doctor if the pain lasts longer than 1 to 2 weeks.   Only take medicine as told by your doctor.   Quit smoking if you smoke.  GET HELP RIGHT AWAY IF:   There is more pain or pain that spreads to the arm, neck, jaw, back, or belly (abdomen).   You or your child has shortness of breath.   You or your child coughs more than usual or coughs up blood.   You or your child has very bad back or belly pain, feels sick to his or her stomach (nauseous), or throws up (vomits).   You or your child has very bad weakness.   You or your child passes out (faints).   You or your child has a temperature by mouth above 102 F (38.9 C), not controlled by medicine.  Any of these problems may be serious and may be an emergency. Do not wait to see if the problems will go away. Get medical help right away. Call your local emergency services 911 in [place] Do not drive yourself to the hospital. MAKE SURE YOU:   Understand these instructions.   Will watch this condition.   Will get help right away if you or your child is not doing well or gets worse.  Document Released: 09/07/2007 Document Revised: 03/10/2011 Document Reviewed:  09/07/2007 [facility] Patient Information 2012 [place], [place].  Gastroesophageal Reflux Disease, Adult Gastroesophageal reflux disease (GERD) happens when acid from your stomach flows up into the esophagus. When acid comes in contact with the esophagus, the acid causes soreness (inflammation) in the esophagus. Over time, GERD may create small holes (ulcers) in the lining of the esophagus. CAUSES   Increased body weight. This puts pressure on the stomach, making acid rise from the stomach into the esophagus.   Smoking. This increases acid production in the stomach.   Drinking alcohol. This causes decreased pressure in the lower esophageal sphincter (valve or ring of muscle between the esophagus and stomach), allowing acid from the stomach into the esophagus.   Late evening meals and a full stomach. This increases pressure and acid production in the stomach.   A malformed lower esophageal sphincter.  Sometimes, no cause is found. SYMPTOMS   Burning pain in the lower part of the mid-chest behind the breastbone and in the mid-stomach area. This may occur twice a week or more often.   Trouble swallowing.   Sore throat.   Dry cough.   Asthma-like symptoms including chest tightness, shortness of breath, or wheezing.  DIAGNOSIS  Your caregiver may be able to diagnose GERD based on your symptoms. In some cases, X-rays and other tests may be done to check for complications or to check the condition of your stomach and esophagus. TREATMENT  Your caregiver may recommend over-the-counter or prescription medicines to help decrease acid production. Ask your caregiver before starting or adding any new medicines.  HOME CARE INSTRUCTIONS   Change the factors that you can control. Ask your caregiver for guidance concerning weight loss, quitting smoking, and alcohol consumption.   Avoid foods and drinks that make your symptoms worse, such as:   Caffeine or alcoholic drinks.   Chocolate.    Peppermint or mint flavorings.   Garlic and onions.   Spicy foods.   Citrus fruits, such as oranges, lemons, or limes.   Tomato-based foods such as sauce, chili, salsa, and pizza.   Fried and fatty foods.   Avoid lying down for the 3 hours prior to your bedtime or prior to taking a nap.   Eat small, frequent meals instead of large meals.   Wear loose-fitting clothing. Do not wear anything tight around your waist that causes pressure on your stomach.   Raise the head of your bed 6 to 8 inches with wood blocks to help you sleep. Extra pillows will not help.   Only take over-the-counter or prescription medicines for pain, discomfort, or fever as directed by your caregiver.   Do not take aspirin, ibuprofen, or other nonsteroidal anti-inflammatory drugs (NSAIDs).  SEEK IMMEDIATE MEDICAL CARE IF:   You have pain in your arms, neck, jaw, teeth, or back.   Your pain increases or changes in intensity or duration.   You develop nausea, vomiting, or sweating (diaphoresis).   You develop shortness of breath, or you faint.   Your vomit is green, yellow, black, or looks like coffee grounds or blood.   Your stool is red, bloody, or black.  These symptoms could be signs of other problems, such as heart disease, gastric bleeding, or esophageal bleeding. MAKE SURE YOU:   Understand these instructions.   Will watch your condition.   Will get help right away if you are not doing well or get worse.  Document Released: 12/29/2004 Document Revised: 03/10/2011 Document Reviewed: 10/08/2010 [facility] Patient Information 2012 [place], [place].  Diet for GERD or PUD Nutrition therapy can help ease the discomfort of gastroesophageal reflux disease (GERD) and peptic ulcer disease (PUD).  HOME CARE INSTRUCTIONS   Eat your meals slowly, in a relaxed setting.   Eat 5 to 6 small meals per day.   If a food causes distress, stop eating it for a period of time.  FOODS TO AVOID  Coffee,  regular or decaffeinated.   Cola beverages, regular or low calorie.   Tea, regular or decaffeinated.   Pepper.   Cocoa.   High fat foods, including meats.   Butter, margarine, hydrogenated oil (trans fats).   Peppermint or spearmint (if you have GERD).   Fruits and vegetables if not tolerated.   Alcohol.   Nicotine (smoking or chewing). This is one of the most potent stimulants to acid production in the gastrointestinal tract.   Any food that seems to aggravate your condition.  If you have questions regarding your diet, ask your caregiver or a registered dietitian. TIPS  Lying flat may make symptoms worse. Keep the head of your bed raised 6 to 9 inches (15 to 23 cm) by using a foam wedge or blocks under the legs of the  bed.   Do not lay down until 3 hours after eating a meal.   Daily physical activity may help reduce symptoms.  MAKE SURE YOU:   Understand these instructions.   Will watch your condition.   Will get help right away if you are not doing well or get worse.  Document Released: 03/21/2005 Document Revised: 03/10/2011 Document Reviewed: 02/04/2011 [facility] Patient Information 2012 [place], [place].  RETURN IF SYMPTOMS RECUR, WORSEN OR NEW PROBLEMS DEVELOP.   SCHEDULE APPOINTMENT WITH [facility] FOR FOLLOW UP IN 2 WEEKS  SEE YOUR PRIMARY CARE DOCTOR IN 1 WEEK TO HAVE BP RECHECKED.

## 2011-09-01 NOTE — Progress Notes (Signed)
Utilization Review Completed.[name] T5/30/2013   

## 2011-10-04 ENCOUNTER — Encounter (HOSPITAL_COMMUNITY): Payer: Self-pay | Admitting: Emergency Medicine

## 2011-10-04 ENCOUNTER — Emergency Department (HOSPITAL_COMMUNITY)
Admission: EM | Admit: 2011-10-04 | Discharge: 2011-10-04 | Disposition: A | Discharge status: Home / Self Care | Payer: Self-pay | Attending: Emergency Medicine | Admitting: Emergency Medicine

## 2011-10-04 ENCOUNTER — Emergency Department (HOSPITAL_COMMUNITY): Payer: Self-pay

## 2011-10-04 DIAGNOSIS — E7800 Pure hypercholesterolemia, unspecified: Secondary | ICD-10-CM | POA: Insufficient documentation

## 2011-10-04 DIAGNOSIS — G4730 Sleep apnea, unspecified: Secondary | ICD-10-CM | POA: Insufficient documentation

## 2011-10-04 DIAGNOSIS — M109 Gout, unspecified: Secondary | ICD-10-CM | POA: Insufficient documentation

## 2011-10-04 DIAGNOSIS — Z87891 Personal history of nicotine dependence: Secondary | ICD-10-CM | POA: Insufficient documentation

## 2011-10-04 DIAGNOSIS — I10 Essential (primary) hypertension: Secondary | ICD-10-CM | POA: Insufficient documentation

## 2011-10-04 DIAGNOSIS — S62639A Displaced fracture of distal phalanx of unspecified finger, initial encounter for closed fracture: Secondary | ICD-10-CM | POA: Insufficient documentation

## 2011-10-04 DIAGNOSIS — W230XXA Caught, crushed, jammed, or pinched between moving objects, initial encounter: Secondary | ICD-10-CM | POA: Insufficient documentation

## 2011-10-04 MED ORDER — IBUPROFEN 800 MG PO TABS
800.0000 mg | ORAL_TABLET | Freq: Once | ORAL | Status: AC
  Administered 2011-10-04: 800 mg via ORAL
  Filled 2011-10-04: qty 1

## 2011-10-04 NOTE — ED Provider Notes (Signed)
History     CSN: [id number]  Arrival date & time 10/04/11  4098   First MD Initiated Contact with Patient 10/04/11 1007      Chief Complaint  Patient presents with  . Finger Injury    (Consider location/radiation/quality/duration/timing/severity/associated sxs/prior treatment) HPI  Patient presents to emergency department complaining of left little finger injury at 4:30 this morning when he slammed his finger in a car door. Patient states that initially this concern because he could not move his finger at the distal joint however states that over time he has gotten more movement in his finger. He complains of some mild swelling and pain at the distal joint. Patient taken nothing for pain prior to arrival. He denies break in skin or numbness the finger. He denies any additional injury. Pain is aggravated by movement and touch and improved with keeping the finger still.  Past Medical History  Diagnosis Date  . Hypertension   . High cholesterol   . Heart murmur     "leaky valve"  . Angina   . Exertional dyspnea   . Sleep apnea 08/2010    "not required to wear mask"  . Gout     "sometimes flares up even w/Allupurinol"    Past Surgical History  Procedure Date  . Upper gastrointestinal endoscopy 03/2011  . Colonoscopy 03/2011  . Knee arthroscopy 2004    "right; w/ligament repair in kneecap"  . Multiple tooth extractions 06/2010    full mouth  . Tonsillectomy     "I was a kid"    Family History  Problem Relation Age of Onset  . Hypertension      History  Substance Use Topics  . Smoking status: Former Smoker -- .1 years    Quit date: 04/04/1976  . Smokeless tobacco: Never Used  . Alcohol Use: No      Review of Systems  Musculoskeletal: Positive for joint swelling and arthralgias.  Skin: Negative for color change and wound.  Neurological: Negative for weakness and numbness.    Allergies  Latex  Home Medications   Current Outpatient Rx  Name Route Sig  Dispense Refill  . ALLOPURINOL 300 MG PO TABS Oral Take 300 mg by mouth daily.    Marland Kitchen AMLODIPINE BESYLATE 10 MG PO TABS Oral Take 10 mg by mouth daily.    . ASPIRIN 325 MG PO TABS Oral Take 1 tablet (325 mg total) by mouth daily. 30 tablet 0  . CLONIDINE HCL 0.1 MG PO TABS Oral Take 1 tablet (0.1 mg total) by mouth 2 (two) times daily. 60 tablet 0  . LISINOPRIL-HYDROCHLOROTHIAZIDE 20-25 MG PO TABS Oral Take 1 tablet by mouth daily.    Marland Kitchen PANTOPRAZOLE SODIUM 40 MG PO TBEC Oral Take 1 tablet (40 mg total) by mouth daily at 12 noon. 30 tablet 2    BP 143/91  Pulse 69  Temp 98.5 F (36.9 C) (Oral)  Resp 20  SpO2 97%  Physical Exam  Nursing note and vitals reviewed. Constitutional: He is oriented to person, place, and time. He appears well-developed.  HENT:  Head: Normocephalic and atraumatic.  Eyes: Conjunctivae are normal.  Cardiovascular: Normal rate.   Pulmonary/Chest: Effort normal.  Musculoskeletal: He exhibits edema and tenderness.       Some mild decrease in range of motion of left pinky finger DIP joint with mild tenderness to palpation and mild swelling however no breaking skin. No deformity. Good sensation of entire finger and hand with normal cap refill.  Neurological:  He is alert and oriented to person, place, and time.  Skin: Skin is warm and dry. No rash noted. No erythema. No pallor.    ED Course  Procedures (including critical care time)  PO ibuprofen and ice to finger.   Labs Reviewed - No data to display No results found.   1. Avulsion fracture of distal phalanx of finger       MDM  Small avulsion fracture of finger but finger is neurovascularly intact with no deformity no break in skin. Will buddy tape for pain relief. Gave hand  followup for further evaluation and management of ongoing pain. Spoke at length with patient about ice and need for Tylenol and ibuprofen for pain relief. He voices understanding.         [place], [place] 10/04/11 [phone]

## 2011-10-04 NOTE — ED Notes (Signed)
Patient was given ice pack to place on affected finger.

## 2011-10-04 NOTE — ED Provider Notes (Signed)
Medical screening examination/treatment/procedure(s) were performed by non-physician practitioner and as supervising physician I was immediately available for consultation/collaboration.   Jquan Egelston B. Bernette Mayers, MD 10/04/11 1112

## 2011-10-04 NOTE — Discharge Instructions (Signed)
Ice finger and may buddy tape for pain relief. tylenol and ibuprofen for additional pain relief. Follow up with hand specialist in 1-2 weeks for recheck of ongoing pain.   Finger Fracture Fractures of fingers are breaks in the bones of the fingers. There are many types of fractures. There are different ways of treating these fractures, all of which can be correct. Your caregiver will discuss the best way to treat your fracture. TREATMENT  Finger fractures can be treated with:   Non-reduction - this means the bones are in place. The finger is splinted without changing the positions of the bone pieces. The splint is usually left on for about a week to ten days. This will depend on your fracture and what your caregiver thinks.   Closed reduction - the bones are put back into position without using surgery. The finger is then splinted.   ORIF (open reduction and internal fixation) - the fracture site is opened. Then the bone pieces are fixed into place with pins or some type of hardware. This is seldom required. It depends on the severity of the fracture.  Your caregiver will discuss the type of fracture you have and the treatment that will be best for that problem. If surgery is the treatment of choice, the following is information for you to know and also let your caregiver know about prior to surgery. LET YOUR CAREGIVER KNOW ABOUT:  Allergies   Medications taken including herbs, eye drops, over the counter medications, and creams   Use of steroids (by mouth or creams)   Previous problems with anesthetics or Novocaine   Possibility of pregnancy, if this applies   History of blood clots (thrombophlebitis)   History of bleeding or blood problems   Previous surgery   Other health problems  AFTER THE PROCEDURE After surgery, you will be taken to the recovery area where a nurse will check your progress. Once you're awake, stable, and taking fluids well, barring other problems you will be  allowed to go home. Once home an ice pack applied to your operative site may help with discomfort and keep the swelling down. HOME CARE INSTRUCTIONS   Follow your caregiver's instructions as to activities, exercises, physical therapy, and driving a car.   Use your finger and exercise as directed.   Only take over-the-counter or prescription medicines for pain, discomfort, or fever as directed by your caregiver. Do not take aspirin until your caregiver OK's it, as this can increase bleeding immediately following surgery.   Stop using ibuprofen if it upsets your stomach. Let your caregiver know about it.  SEEK MEDICAL CARE IF:  You have increased bleeding (more than a small spot) from the wound or from beneath your splint.   You develop redness, swelling, or increasing pain in the wound or from beneath your splint.   There is pus coming from the wound or from beneath your splint.   An unexplained oral temperature above 102 F (38.9 C) develops, or as your caregiver suggests.   There is a foul smell coming from the wound or dressing or from beneath your splint.  SEEK IMMEDIATE MEDICAL CARE IF:   You develop a rash.   You have difficulty breathing.   You have any allergic problems.  MAKE SURE YOU:   Understand these instructions.   Will watch your condition.   Will get help right away if you are not doing well or get worse.  Document Released: 07/03/2000 Document Revised: 03/10/2011 Document Reviewed: 11/08/2007  [employer] Patient Information 2012 [place].

## 2011-10-04 NOTE — ED Notes (Signed)
Patient claims he closed his hand in the car door.  Patient claims he has injured the 5th finger on the L hand.  Patient states "i don't think it's broken, i can move it".

## 2011-10-16 ENCOUNTER — Emergency Department (HOSPITAL_COMMUNITY): Payer: No Typology Code available for payment source

## 2011-10-16 ENCOUNTER — Encounter (HOSPITAL_COMMUNITY): Payer: Self-pay | Admitting: Family Medicine

## 2011-10-16 ENCOUNTER — Emergency Department (HOSPITAL_COMMUNITY): DRG: 200 | Payer: No Typology Code available for payment source | Attending: Emergency Medicine

## 2011-10-16 ENCOUNTER — Inpatient Hospital Stay (HOSPITAL_COMMUNITY)
Admission: EM | Admit: 2011-10-16 | Discharge: 2011-10-24 | Disposition: A | Discharge status: Home / Self Care | Payer: No Typology Code available for payment source | Source: Home / Self Care

## 2011-10-16 DIAGNOSIS — S2190XA Unspecified open wound of unspecified part of thorax, initial encounter: Secondary | ICD-10-CM | POA: Diagnosis present

## 2011-10-16 DIAGNOSIS — S21119A Laceration without foreign body of unspecified front wall of thorax without penetration into thoracic cavity, initial encounter: Secondary | ICD-10-CM | POA: Diagnosis present

## 2011-10-16 DIAGNOSIS — S272XXA Traumatic hemopneumothorax, initial encounter: Secondary | ICD-10-CM | POA: Diagnosis present

## 2011-10-16 DIAGNOSIS — Y92009 Unspecified place in unspecified non-institutional (private) residence as the place of occurrence of the external cause: Secondary | ICD-10-CM

## 2011-10-16 DIAGNOSIS — D62 Acute posthemorrhagic anemia: Secondary | ICD-10-CM | POA: Diagnosis present

## 2011-10-16 DIAGNOSIS — I359 Nonrheumatic aortic valve disorder, unspecified: Secondary | ICD-10-CM | POA: Diagnosis present

## 2011-10-16 DIAGNOSIS — S21209A Unspecified open wound of unspecified back wall of thorax without penetration into thoracic cavity, initial encounter: Secondary | ICD-10-CM | POA: Diagnosis present

## 2011-10-16 DIAGNOSIS — M109 Gout, unspecified: Secondary | ICD-10-CM | POA: Diagnosis present

## 2011-10-16 DIAGNOSIS — E876 Hypokalemia: Secondary | ICD-10-CM | POA: Diagnosis present

## 2011-10-16 DIAGNOSIS — S21219A Laceration without foreign body of unspecified back wall of thorax without penetration into thoracic cavity, initial encounter: Secondary | ICD-10-CM | POA: Diagnosis present

## 2011-10-16 DIAGNOSIS — K219 Gastro-esophageal reflux disease without esophagitis: Secondary | ICD-10-CM | POA: Diagnosis present

## 2011-10-16 DIAGNOSIS — S21109A Unspecified open wound of unspecified front wall of thorax without penetration into thoracic cavity, initial encounter: Secondary | ICD-10-CM

## 2011-10-16 LAB — URINALYSIS, MICROSCOPIC ONLY
Leukocytes, UA: NEGATIVE
Nitrite: NEGATIVE
Specific Gravity, Urine: 1.017 (ref 1.005–1.030)
pH: 5 (ref 5.0–8.0)

## 2011-10-16 LAB — POCT I-STAT, CHEM 8
BUN: 10 mg/dL (ref 6–23)
Creatinine, Ser: 1.2 mg/dL (ref 0.50–1.35)
Glucose, Bld: 212 mg/dL — ABNORMAL HIGH (ref 70–99)
HCT: 38 % — ABNORMAL LOW (ref 39.0–52.0)
Potassium: 3 mEq/L — ABNORMAL LOW (ref 3.5–5.1)
TCO2: 17 mmol/L (ref 0–100)

## 2011-10-16 LAB — COMPREHENSIVE METABOLIC PANEL
BUN: 11 mg/dL (ref 6–23)
CO2: 17 mEq/L — ABNORMAL LOW (ref 19–32)
Chloride: 102 mEq/L (ref 96–112)
Creatinine, Ser: 1.08 mg/dL (ref 0.50–1.35)
GFR calc Af Amer: >90 mL/min (ref >90)
GFR calc non Af Amer: 78 mL/min — ABNORMAL LOW (ref >90)
Total Bilirubin: 0.3 mg/dL (ref 0.3–1.2)

## 2011-10-16 LAB — MRSA PCR SCREENING: MRSA by PCR: NEGATIVE

## 2011-10-16 LAB — CBC
Platelets: 166 10*3/uL (ref 150–400)
RBC: 4.73 MIL/uL (ref 4.22–5.81)
WBC: 6.6 10*3/uL (ref 4.0–10.5)

## 2011-10-16 LAB — LACTIC ACID, PLASMA: Lactic Acid, Venous: 8.4 mmol/L — ABNORMAL HIGH (ref 0.5–2.2)

## 2011-10-16 MED ORDER — ALLOPURINOL 300 MG PO TABS
300.0000 mg | ORAL_TABLET | Freq: Every day | ORAL | Status: DC
  Administered 2011-10-16 – 2011-10-24 (×9): 300 mg via ORAL
  Filled 2011-10-16 (×9): qty 1

## 2011-10-16 MED ORDER — CYCLOBENZAPRINE HCL 10 MG PO TABS
10.0000 mg | ORAL_TABLET | Freq: Every day | ORAL | Status: DC | PRN
  Administered 2011-10-16 – 2011-10-24 (×4): 10 mg via ORAL
  Filled 2011-10-16 (×4): qty 1

## 2011-10-16 MED ORDER — ONDANSETRON HCL 4 MG/2ML IJ SOLN: 4.0000 mg | Freq: Four times a day (QID) | INTRAMUSCULAR | Status: DC | PRN

## 2011-10-16 MED ORDER — IOHEXOL 300 MG/ML  SOLN: 100.0000 mL | Freq: Once | INTRAMUSCULAR | Status: DC | PRN

## 2011-10-16 MED ORDER — LIDOCAINE HCL (CARDIAC) 20 MG/ML IV SOLN
INTRAVENOUS | Status: AC
  Filled 2011-10-16: qty 5

## 2011-10-16 MED ORDER — ACETAMINOPHEN 325 MG PO TABS
650.0000 mg | ORAL_TABLET | ORAL | Status: DC | PRN
  Administered 2011-10-18 – 2011-10-22 (×2): 650 mg via ORAL
  Filled 2011-10-16 (×2): qty 2

## 2011-10-16 MED ORDER — HYDROMORPHONE HCL PF 1 MG/ML IJ SOLN
INTRAMUSCULAR | Status: AC
  Filled 2011-10-16: qty 1

## 2011-10-16 MED ORDER — LISINOPRIL 20 MG PO TABS
20.0000 mg | ORAL_TABLET | Freq: Every day | ORAL | Status: DC
  Administered 2011-10-16 – 2011-10-24 (×6): 20 mg via ORAL
  Filled 2011-10-16 (×9): qty 1

## 2011-10-16 MED ORDER — LIDOCAINE-EPINEPHRINE 1 %-1:100000 IJ SOLN
INTRAMUSCULAR | Status: AC
  Filled 2011-10-16: qty 1

## 2011-10-16 MED ORDER — HYDROMORPHONE HCL PF 1 MG/ML IJ SOLN
1.0000 mg | INTRAMUSCULAR | Status: DC | PRN
  Administered 2011-10-16: 1 mg via INTRAVENOUS

## 2011-10-16 MED ORDER — FENTANYL CITRATE 0.05 MG/ML IJ SOLN
INTRAMUSCULAR | Status: AC
  Filled 2011-10-16: qty 2

## 2011-10-16 MED ORDER — HYDROMORPHONE 0.3 MG/ML IV SOLN
INTRAVENOUS | Status: DC
  Administered 2011-10-16: 1.2 mg via INTRAVENOUS
  Administered 2011-10-16: 0.6 mg via INTRAVENOUS
  Administered 2011-10-16: 0.9 mg via INTRAVENOUS
  Administered 2011-10-16: 10:00:00 via INTRAVENOUS
  Administered 2011-10-17: 2.64 mg via INTRAVENOUS
  Administered 2011-10-17: 0.6 mg via INTRAVENOUS
  Administered 2011-10-17: 1.2 mg via INTRAVENOUS
  Administered 2011-10-17: 2.7 mg via INTRAVENOUS
  Administered 2011-10-17: 0.6 mg via INTRAVENOUS
  Administered 2011-10-17: 10:00:00 via INTRAVENOUS
  Administered 2011-10-18: 1.9 mg via INTRAVENOUS
  Administered 2011-10-18: 0.9 mg via INTRAVENOUS
  Administered 2011-10-18: 08:00:00 via INTRAVENOUS
  Filled 2011-10-16 (×3): qty 25

## 2011-10-16 MED ORDER — MIDAZOLAM HCL 2 MG/2ML IJ SOLN
INTRAMUSCULAR | Status: AC
  Administered 2011-10-16: 4 mg
  Filled 2011-10-16: qty 4

## 2011-10-16 MED ORDER — ROCURONIUM BROMIDE 50 MG/5ML IV SOLN
INTRAVENOUS | Status: AC
  Filled 2011-10-16: qty 2

## 2011-10-16 MED ORDER — ETOMIDATE 2 MG/ML IV SOLN
INTRAVENOUS | Status: AC
  Filled 2011-10-16: qty 20

## 2011-10-16 MED ORDER — PANTOPRAZOLE SODIUM 40 MG IV SOLR
40.0000 mg | Freq: Every day | INTRAVENOUS | Status: DC
  Filled 2011-10-16 (×4): qty 40

## 2011-10-16 MED ORDER — AMLODIPINE BESYLATE 10 MG PO TABS
10.0000 mg | ORAL_TABLET | Freq: Every day | ORAL | Status: DC
  Administered 2011-10-16 – 2011-10-24 (×8): 10 mg via ORAL
  Filled 2011-10-16 (×9): qty 1

## 2011-10-16 MED ORDER — OXYCODONE HCL 5 MG PO TABS: 5.0000 mg | ORAL_TABLET | ORAL | Status: DC | PRN

## 2011-10-16 MED ORDER — SODIUM CHLORIDE 0.9 % IJ SOLN: 9.0000 mL | INTRAMUSCULAR | Status: DC | PRN

## 2011-10-16 MED ORDER — HYDROMORPHONE HCL PF 1 MG/ML IJ SOLN: 2.0000 mg | Freq: Once | INTRAMUSCULAR | Status: DC

## 2011-10-16 MED ORDER — FENTANYL CITRATE 0.05 MG/ML IJ SOLN
INTRAMUSCULAR | Status: AC | PRN
  Administered 2011-10-16: 100 ug via INTRAVENOUS

## 2011-10-16 MED ORDER — DIPHENHYDRAMINE HCL 50 MG/ML IJ SOLN
12.5000 mg | Freq: Four times a day (QID) | INTRAMUSCULAR | Status: DC | PRN
  Administered 2011-10-17 – 2011-10-18 (×3): 12.5 mg via INTRAVENOUS
  Filled 2011-10-16 (×4): qty 1

## 2011-10-16 MED ORDER — FENTANYL CITRATE 0.05 MG/ML IJ SOLN
INTRAMUSCULAR | Status: AC
  Administered 2011-10-16: 100 ug
  Filled 2011-10-16: qty 2

## 2011-10-16 MED ORDER — CEFAZOLIN SODIUM-DEXTROSE 2-3 GM-% IV SOLR
2.0000 g | Freq: Once | INTRAVENOUS | Status: AC
  Administered 2011-10-16: 2 g via INTRAVENOUS
  Filled 2011-10-16: qty 50

## 2011-10-16 MED ORDER — DIPHENHYDRAMINE HCL 12.5 MG/5ML PO ELIX
12.5000 mg | ORAL_SOLUTION | Freq: Four times a day (QID) | ORAL | Status: DC | PRN
  Administered 2011-10-18: 12.5 mg via ORAL
  Filled 2011-10-16: qty 5

## 2011-10-16 MED ORDER — DEXTROSE IN LACTATED RINGERS 5 % IV SOLN
INTRAVENOUS | Status: DC
  Administered 2011-10-16: 09:00:00 via INTRAVENOUS
  Administered 2011-10-17: 100 mL/h via INTRAVENOUS

## 2011-10-16 MED ORDER — TETANUS-DIPHTH-ACELL PERTUSSIS 5-2.5-18.5 LF-MCG/0.5 IM SUSP
INTRAMUSCULAR | Status: AC
  Filled 2011-10-16: qty 0.5

## 2011-10-16 MED ORDER — HYDROCHLOROTHIAZIDE 25 MG PO TABS
25.0000 mg | ORAL_TABLET | Freq: Every day | ORAL | Status: DC
  Administered 2011-10-16 – 2011-10-24 (×9): 25 mg via ORAL
  Filled 2011-10-16 (×9): qty 1

## 2011-10-16 MED ORDER — PANTOPRAZOLE SODIUM 40 MG PO TBEC
40.0000 mg | DELAYED_RELEASE_TABLET | Freq: Every day | ORAL | Status: DC
  Administered 2011-10-16 – 2011-10-24 (×9): 40 mg via ORAL
  Filled 2011-10-16 (×7): qty 1

## 2011-10-16 MED ORDER — LISINOPRIL-HYDROCHLOROTHIAZIDE 20-25 MG PO TABS: 1.0000 | ORAL_TABLET | Freq: Every day | ORAL | Status: DC

## 2011-10-16 MED ORDER — TETANUS-DIPHTH-ACELL PERTUSSIS 5-2.5-18.5 LF-MCG/0.5 IM SUSP
0.5000 mL | Freq: Once | INTRAMUSCULAR | Status: AC
  Administered 2011-10-16: 0.5 mL via INTRAMUSCULAR

## 2011-10-16 MED ORDER — SUCCINYLCHOLINE CHLORIDE 20 MG/ML IJ SOLN
INTRAMUSCULAR | Status: AC
  Filled 2011-10-16: qty 10

## 2011-10-16 MED ORDER — ONDANSETRON HCL 4 MG PO TABS
4.0000 mg | ORAL_TABLET | Freq: Four times a day (QID) | ORAL | Status: DC | PRN
  Administered 2011-10-24: 4 mg via ORAL
  Filled 2011-10-16: qty 1

## 2011-10-16 MED ORDER — NALOXONE HCL 0.4 MG/ML IJ SOLN: 0.4000 mg | INTRAMUSCULAR | Status: DC | PRN

## 2011-10-16 MED ORDER — OXYCODONE HCL 5 MG PO TABS: 10.0000 mg | ORAL_TABLET | ORAL | Status: DC | PRN

## 2011-10-16 MED ORDER — IPRATROPIUM-ALBUTEROL 18-103 MCG/ACT IN AERO
2.0000 | INHALATION_SPRAY | Freq: Four times a day (QID) | RESPIRATORY_TRACT | Status: DC | PRN
  Filled 2011-10-16: qty 14.7

## 2011-10-16 NOTE — ED Notes (Signed)
Pt reports having decrease in pain to chest at this time. LEO at bedside

## 2011-10-16 NOTE — ED Notes (Addendum)
Pt family Kayren Eaves Spinks wife given last four digits to MRN for pt XXX status for updates. Pt wife will return for updates.

## 2011-10-16 NOTE — ED Notes (Signed)
Pt sent to CT scanner

## 2011-10-16 NOTE — ED Notes (Signed)
Pt awaken with reports of having bilateral chest pain, plan of care updated with verbal understanding. Pt will be medicated per MAR, will continue to monitor pt, VSS.

## 2011-10-16 NOTE — ED Notes (Signed)
Trauma MD and ER MD at bedside for bilateral chest tube placement, plan of care is updated with pt by MD Janee Morn, pt verbalizes understanding of procedure.
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
## 2011-10-16 NOTE — ED Notes (Signed)
MD.

## 2011-10-16 NOTE — ED Notes (Signed)
Pt returned from CT testing accompanied by RN and tech, placed back on room cardiac monitoring. Pt has no decrease in neuro status observed at this time and bleeding is controlled to wounds at this time.

## 2011-10-16 NOTE — ED Notes (Signed)
Pt

## 2011-10-16 NOTE — ED Notes (Signed)
MD Janee Morn at bedside for increase in secure of left sided chest tube. Pt continues to rest quietly with eyes closed, resp e/u and skin w/d at this time. Will continue to monitor pt.
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
## 2011-10-16 NOTE — Procedures (Signed)
Chest Tube Insertion Under conscious sedation 20 minutes Procedure Note  Indications:  Clinically significant Pneumothorax and Hemothorax  Pre-operative Diagnosis: Pneumothorax and Hemothorax left  Post-operative Diagnosis: Pneumothorax and Hemothorax left  Procedure Details  Emergency consent was obtained for the procedure, including sedation.  Risks of lung perforation, hemorrhage, arrhythmia, and adverse drug reaction were discussed.   After sterile skin prep, using standard technique, a 28 French tube was placed in the left Anterior axillary line nipple level  Findings: Rush of air and some blood  Estimated Blood Loss:  less than 100 mL         Specimens:  None              Complications:  None; patient tolerated the procedure well.         Disposition: ICU - extubated and stable.         Condition: stable  Violeta Gelinas, MD, MPH, FACS Pager: [phone]

## 2011-10-16 NOTE — Progress Notes (Signed)
This visit was in response to ED unit secretary page. Pt's wife and daughter were in waiting room.  I escorted family to conference room, alerted staff where family was located, and provided emotional support to family while they waited on pt's medical report. Bernard Slayden  [phone] oncall pager

## 2011-10-16 NOTE — ED Notes (Signed)
Pt procedure of bilateral chest tubed placed by MD's with connection of bilateral drainage systems and hooked to suction. Pt is resting with eyes closed, no s/s of any pain or distress noted. MD Janee Morn remains at bedside for suturing of left axillary laceration.
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
## 2011-10-17 ENCOUNTER — Inpatient Hospital Stay (HOSPITAL_COMMUNITY): Payer: No Typology Code available for payment source

## 2011-10-17 DIAGNOSIS — S270XXA Traumatic pneumothorax, initial encounter: Secondary | ICD-10-CM

## 2011-10-17 DIAGNOSIS — D62 Acute posthemorrhagic anemia: Secondary | ICD-10-CM

## 2011-10-17 LAB — CBC
Hemoglobin: 9.4 g/dL — ABNORMAL LOW (ref 13.0–17.0)
MCH: 24.2 pg — ABNORMAL LOW (ref 26.0–34.0)
Platelets: 129 10*3/uL — ABNORMAL LOW (ref 150–400)
RBC: 3.88 MIL/uL — ABNORMAL LOW (ref 4.22–5.81)
WBC: 7.3 10*3/uL (ref 4.0–10.5)

## 2011-10-17 LAB — BASIC METABOLIC PANEL
CO2: 27 mEq/L (ref 19–32)
Chloride: 101 mEq/L (ref 96–112)
Glucose, Bld: 113 mg/dL — ABNORMAL HIGH (ref 70–99)
Potassium: 3.1 mEq/L — ABNORMAL LOW (ref 3.5–5.1)
Sodium: 138 mEq/L (ref 135–145)

## 2011-10-17 LAB — HEMOGLOBIN AND HEMATOCRIT, BLOOD
HCT: 31.5 % — ABNORMAL LOW (ref 39.0–52.0)
Hemoglobin: 10.5 g/dL — ABNORMAL LOW (ref 13.0–17.0)

## 2011-10-17 LAB — TYPE AND SCREEN
Antibody Screen: POSITIVE
DAT, IgG: NEGATIVE
PT AG Type: NEGATIVE
Unit division: 0
Unit division: 0

## 2011-10-17 MED ORDER — POTASSIUM CHLORIDE 10 MEQ/100ML IV SOLN
10.0000 meq | INTRAVENOUS | Status: AC
  Administered 2011-10-17 (×3): 10 meq via INTRAVENOUS
  Filled 2011-10-17: qty 300

## 2011-10-17 MED FILL — Hydromorphone HCl Inj 1 MG/ML: INTRAMUSCULAR | Qty: 2 | Status: AC

## 2011-10-17 NOTE — Progress Notes (Signed)
UR complete 
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
## 2011-10-17 NOTE — Plan of Care (Signed)
Problem: Diagnosis - Type of Surgery Goal: General Surgical Patient Education (See Patient Education module for education specifics)  Outcome: Completed/Met Date Met:  10/17/11 Bil. CT placed d/t Bil pthx/ Lt hemothorax

## 2011-10-18 ENCOUNTER — Inpatient Hospital Stay (HOSPITAL_COMMUNITY): Payer: No Typology Code available for payment source

## 2011-10-18 DIAGNOSIS — E876 Hypokalemia: Secondary | ICD-10-CM

## 2011-10-18 LAB — BASIC METABOLIC PANEL
BUN: 7 mg/dL (ref 6–23)
CO2: 31 mEq/L (ref 19–32)
Chloride: 98 mEq/L (ref 96–112)
Creatinine, Ser: 0.88 mg/dL (ref 0.50–1.35)
GFR calc Af Amer: >90 mL/min (ref >90)
Potassium: 3.2 mEq/L — ABNORMAL LOW (ref 3.5–5.1)

## 2011-10-18 LAB — CBC WITH DIFFERENTIAL/PLATELET
Basophils Relative: 0 % (ref 0–1)
HCT: 28 % — ABNORMAL LOW (ref 39.0–52.0)
Hemoglobin: 9.2 g/dL — ABNORMAL LOW (ref 13.0–17.0)
Lymphocytes Relative: 34 % (ref 12–46)
MCHC: 32.9 g/dL (ref 30.0–36.0)
Monocytes Absolute: 0.6 10*3/uL (ref 0.1–1.0)
Monocytes Relative: 9 % (ref 3–12)
Neutro Abs: 3.7 10*3/uL (ref 1.7–7.7)
Neutrophils Relative %: 57 % (ref 43–77)
RBC: 3.86 MIL/uL — ABNORMAL LOW (ref 4.22–5.81)
WBC: 6.5 10*3/uL (ref 4.0–10.5)

## 2011-10-18 MED ORDER — OXYCODONE HCL 5 MG PO TABS
10.0000 mg | ORAL_TABLET | ORAL | Status: DC | PRN
  Administered 2011-10-18 (×2): 10 mg via ORAL
  Administered 2011-10-18: 5 mg via ORAL
  Administered 2011-10-19: 15 mg via ORAL
  Filled 2011-10-18: qty 1
  Filled 2011-10-18: qty 3
  Filled 2011-10-18 (×2): qty 2

## 2011-10-18 MED ORDER — MORPHINE SULFATE 2 MG/ML IJ SOLN
2.0000 mg | INTRAMUSCULAR | Status: DC | PRN
  Administered 2011-10-19: 2 mg via INTRAVENOUS
  Filled 2011-10-18: qty 1

## 2011-10-18 MED ORDER — POTASSIUM CHLORIDE CRYS ER 20 MEQ PO TBCR
40.0000 meq | EXTENDED_RELEASE_TABLET | Freq: Once | ORAL | Status: AC
  Administered 2011-10-18: 40 meq via ORAL
  Filled 2011-10-18: qty 2
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
## 2011-10-19 ENCOUNTER — Inpatient Hospital Stay (HOSPITAL_COMMUNITY): Payer: No Typology Code available for payment source

## 2011-10-19 DIAGNOSIS — K219 Gastro-esophageal reflux disease without esophagitis: Secondary | ICD-10-CM | POA: Insufficient documentation

## 2011-10-19 DIAGNOSIS — I359 Nonrheumatic aortic valve disorder, unspecified: Secondary | ICD-10-CM

## 2011-10-19 DIAGNOSIS — I350 Nonrheumatic aortic (valve) stenosis: Secondary | ICD-10-CM | POA: Insufficient documentation

## 2011-10-19 DIAGNOSIS — S21219A Laceration without foreign body of unspecified back wall of thorax without penetration into thoracic cavity, initial encounter: Secondary | ICD-10-CM | POA: Diagnosis present

## 2011-10-19 DIAGNOSIS — S21119A Laceration without foreign body of unspecified front wall of thorax without penetration into thoracic cavity, initial encounter: Secondary | ICD-10-CM | POA: Diagnosis present

## 2011-10-19 DIAGNOSIS — I10 Essential (primary) hypertension: Secondary | ICD-10-CM | POA: Insufficient documentation

## 2011-10-19 DIAGNOSIS — M109 Gout, unspecified: Secondary | ICD-10-CM | POA: Insufficient documentation

## 2011-10-19 DIAGNOSIS — E785 Hyperlipidemia, unspecified: Secondary | ICD-10-CM | POA: Insufficient documentation

## 2011-10-19 DIAGNOSIS — D62 Acute posthemorrhagic anemia: Secondary | ICD-10-CM | POA: Diagnosis not present

## 2011-10-19 DIAGNOSIS — S272XXA Traumatic hemopneumothorax, initial encounter: Secondary | ICD-10-CM | POA: Diagnosis present

## 2011-10-19 LAB — CBC
HCT: 29 % — ABNORMAL LOW (ref 39.0–52.0)
Hemoglobin: 9.6 g/dL — ABNORMAL LOW (ref 13.0–17.0)
MCV: 72.1 fL — ABNORMAL LOW (ref 78.0–100.0)
WBC: 5.5 10*3/uL (ref 4.0–10.5)

## 2011-10-19 MED ORDER — ENOXAPARIN SODIUM 40 MG/0.4ML ~~LOC~~ SOLN
40.0000 mg | SUBCUTANEOUS | Status: DC
  Administered 2011-10-19 – 2011-10-23 (×5): 40 mg via SUBCUTANEOUS
  Filled 2011-10-19 (×6): qty 0.4

## 2011-10-19 MED ORDER — OXYCODONE HCL 5 MG PO TABS
10.0000 mg | ORAL_TABLET | ORAL | Status: DC | PRN
  Administered 2011-10-19 – 2011-10-21 (×6): 15 mg via ORAL
  Administered 2011-10-22: 20 mg via ORAL
  Administered 2011-10-22: 15 mg via ORAL
  Administered 2011-10-22: 20 mg via ORAL
  Administered 2011-10-23 – 2011-10-24 (×4): 10 mg via ORAL
  Filled 2011-10-19: qty 3
  Filled 2011-10-19 (×3): qty 4
  Filled 2011-10-19: qty 2
  Filled 2011-10-19 (×2): qty 3
  Filled 2011-10-19: qty 4
  Filled 2011-10-19: qty 2
  Filled 2011-10-19 (×3): qty 3
  Filled 2011-10-19: qty 4
  Filled 2011-10-19: qty 3

## 2011-10-19 MED ORDER — POTASSIUM CHLORIDE CRYS ER 20 MEQ PO TBCR
40.0000 meq | EXTENDED_RELEASE_TABLET | Freq: Two times a day (BID) | ORAL | Status: DC
  Administered 2011-10-19 – 2011-10-24 (×10): 40 meq via ORAL
  Filled 2011-10-19 (×13): qty 2

## 2011-10-19 MED ORDER — MORPHINE SULFATE 4 MG/ML IJ SOLN: 4.0000 mg | INTRAMUSCULAR | Status: DC | PRN

## 2011-10-19 MED ORDER — BACITRACIN ZINC 500 UNIT/GM EX OINT
TOPICAL_OINTMENT | Freq: Two times a day (BID) | CUTANEOUS | Status: DC
  Administered 2011-10-19 (×2): via TOPICAL
  Administered 2011-10-20: 1 via TOPICAL
  Administered 2011-10-20 – 2011-10-23 (×6): via TOPICAL
  Filled 2011-10-19: qty 15
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
## 2011-10-20 ENCOUNTER — Inpatient Hospital Stay (HOSPITAL_COMMUNITY): Payer: No Typology Code available for payment source

## 2011-10-20 LAB — BASIC METABOLIC PANEL
BUN: 11 mg/dL (ref 6–23)
CO2: 27 mEq/L (ref 19–32)
Calcium: 9.6 mg/dL (ref 8.4–10.5)
GFR calc non Af Amer: 81 mL/min — ABNORMAL LOW (ref >90)
Glucose, Bld: 100 mg/dL — ABNORMAL HIGH (ref 70–99)
Sodium: 136 mEq/L (ref 135–145)
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
## 2011-10-20 NOTE — Progress Notes (Signed)
Patient a bit discouraged about CT back to suction.  I spoke to him and his visitor. Otherwise progressing well. Patient examined and I agree with the assessment and plan  Violeta Gelinas, MD, MPH, FACS Pager: [phone]  10/20/2011 1:22 PM

## 2011-10-20 NOTE — Progress Notes (Signed)
UR updated.  

## 2011-10-21 ENCOUNTER — Inpatient Hospital Stay (HOSPITAL_COMMUNITY): Payer: No Typology Code available for payment source

## 2011-10-21 LAB — BASIC METABOLIC PANEL
BUN: 12 mg/dL (ref 6–23)
Creatinine, Ser: 0.91 mg/dL (ref 0.50–1.35)
GFR calc Af Amer: >90 mL/min (ref >90)
GFR calc non Af Amer: >90 mL/min (ref >90)
Glucose, Bld: 124 mg/dL — ABNORMAL HIGH (ref 70–99)
Potassium: 3.7 mEq/L (ref 3.5–5.1)

## 2011-10-21 MED ORDER — DIPHENHYDRAMINE HCL 25 MG PO CAPS
25.0000 mg | ORAL_CAPSULE | Freq: Four times a day (QID) | ORAL | Status: DC | PRN
  Administered 2011-10-21 – 2011-10-22 (×3): 25 mg via ORAL
  Filled 2011-10-21 (×3): qty 1
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
## 2011-10-21 NOTE — Progress Notes (Signed)
This patient has been seen and I agree with the findings and treatment plan.  Amada Hallisey O. Levie Wages, III, MD, FACS [phone] (pager) [phone] (direct pager) Trauma Surgeon  

## 2011-10-22 ENCOUNTER — Inpatient Hospital Stay (HOSPITAL_COMMUNITY): Payer: No Typology Code available for payment source
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
## 2011-10-23 ENCOUNTER — Inpatient Hospital Stay (HOSPITAL_COMMUNITY): Payer: No Typology Code available for payment source
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
## 2011-10-24 ENCOUNTER — Inpatient Hospital Stay (HOSPITAL_COMMUNITY): Payer: No Typology Code available for payment source

## 2011-10-24 MED ORDER — OXYCODONE-ACETAMINOPHEN 5-325 MG PO TABS: 1.0000 | ORAL_TABLET | ORAL | Status: AC | PRN

## 2011-10-24 NOTE — Progress Notes (Signed)
CXR okay.  CT removed.  Home later today if CXR stable.  This patient has been seen and I agree with the findings and treatment plan.  Marta Lamas. Gae Bon, MD, FACS [phone] (pager) [phone] (direct pager) Trauma Surgeon
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
## 2011-10-24 NOTE — Discharge Summary (Signed)
Patient sent home with CXR unchanged.  We will see him in clinic soon.  This patient has been seen and I agree with the findings and treatment plan.  Marta Lamas. Gae Bon, MD, FACS [phone] (pager) [phone] (direct pager) Trauma Surgeon

## 2011-10-25 ENCOUNTER — Encounter (HOSPITAL_COMMUNITY): Payer: Self-pay | Admitting: Emergency Medicine

## 2011-10-27 ENCOUNTER — Encounter (INDEPENDENT_AMBULATORY_CARE_PROVIDER_SITE_OTHER): Payer: Self-pay

## 2011-10-27 ENCOUNTER — Ambulatory Visit (INDEPENDENT_AMBULATORY_CARE_PROVIDER_SITE_OTHER): Payer: Self-pay | Admitting: Internal Medicine

## 2011-10-27 VITALS — BP 138/76 | HR 72 | Temp 97.4°F | Resp 16 | Ht 69.0 in | Wt 181.1 lb

## 2011-10-27 DIAGNOSIS — S21209A Unspecified open wound of unspecified back wall of thorax without penetration into thoracic cavity, initial encounter: Secondary | ICD-10-CM

## 2011-10-27 DIAGNOSIS — S270XXA Traumatic pneumothorax, initial encounter: Secondary | ICD-10-CM

## 2011-10-27 DIAGNOSIS — S21219A Laceration without foreign body of unspecified back wall of thorax without penetration into thoracic cavity, initial encounter: Secondary | ICD-10-CM

## 2011-10-27 NOTE — Patient Instructions (Signed)
Neosporin and dry dressing to left chest wound twice daily May shower and use soap and water over wounds.  Dry well and then redress.  May return to work in 2 weeks.
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
## 2011-10-28 MED FILL — Oxycodone HCl Tab 5 MG: ORAL | Qty: 4 | Status: AC

## 2011-10-28 MED FILL — Potassium Chloride Microencapsulated Crys ER Tab 20 mEq: ORAL | Qty: 2 | Status: AC

## 2011-11-27 ENCOUNTER — Encounter (HOSPITAL_COMMUNITY): Payer: Self-pay | Admitting: Emergency Medicine

## 2011-11-27 ENCOUNTER — Emergency Department (HOSPITAL_COMMUNITY)
Admission: EM | Admit: 2011-11-27 | Discharge: 2011-11-27 | Disposition: A | Discharge status: Home / Self Care | Payer: Self-pay | Attending: Emergency Medicine | Admitting: Emergency Medicine

## 2011-11-27 ENCOUNTER — Emergency Department (HOSPITAL_COMMUNITY): Payer: Self-pay

## 2011-11-27 DIAGNOSIS — R079 Chest pain, unspecified: Secondary | ICD-10-CM | POA: Insufficient documentation

## 2011-11-27 DIAGNOSIS — I10 Essential (primary) hypertension: Secondary | ICD-10-CM | POA: Insufficient documentation

## 2011-11-27 DIAGNOSIS — Z79899 Other long term (current) drug therapy: Secondary | ICD-10-CM | POA: Insufficient documentation

## 2011-11-27 DIAGNOSIS — R209 Unspecified disturbances of skin sensation: Secondary | ICD-10-CM | POA: Insufficient documentation

## 2011-11-27 LAB — CBC
HCT: 42.8 % (ref 39.0–52.0)
Hemoglobin: 14.5 g/dL (ref 13.0–17.0)
MCH: 24.3 pg — ABNORMAL LOW (ref 26.0–34.0)
MCHC: 33.9 g/dL (ref 30.0–36.0)
RBC: 5.96 MIL/uL — ABNORMAL HIGH (ref 4.22–5.81)

## 2011-11-27 LAB — POCT I-STAT TROPONIN I: Troponin i, poc: 0.01 ng/mL (ref 0.00–0.08)

## 2011-11-27 LAB — BASIC METABOLIC PANEL
BUN: 7 mg/dL (ref 6–23)
CO2: 25 mEq/L (ref 19–32)
Calcium: 10.1 mg/dL (ref 8.4–10.5)
GFR calc non Af Amer: >90 mL/min (ref >90)
Glucose, Bld: 114 mg/dL — ABNORMAL HIGH (ref 70–99)
Potassium: 3.1 mEq/L — ABNORMAL LOW (ref 3.5–5.1)
Sodium: 138 mEq/L (ref 135–145)

## 2011-11-27 MED ORDER — PANTOPRAZOLE SODIUM 40 MG PO TBEC: 40.0000 mg | DELAYED_RELEASE_TABLET | Freq: Every day | ORAL | Status: DC

## 2011-11-27 MED ORDER — LISINOPRIL 20 MG PO TABS
20.0000 mg | ORAL_TABLET | Freq: Once | ORAL | Status: AC
  Administered 2011-11-27: 20 mg via ORAL
  Filled 2011-11-27 (×2): qty 1

## 2011-11-27 MED ORDER — HYDROCODONE-ACETAMINOPHEN 5-325 MG PO TABS
2.0000 | ORAL_TABLET | Freq: Once | ORAL | Status: AC
  Administered 2011-11-27: 2 via ORAL
  Filled 2011-11-27: qty 2

## 2011-11-27 MED ORDER — GI COCKTAIL ~~LOC~~
30.0000 mL | Freq: Once | ORAL | Status: AC
  Administered 2011-11-27: 30 mL via ORAL
  Filled 2011-11-27: qty 30

## 2011-11-27 MED ORDER — LISINOPRIL-HYDROCHLOROTHIAZIDE 20-12.5 MG PO TABS: 1.0000 | ORAL_TABLET | Freq: Every day | ORAL | Status: DC

## 2011-11-27 MED ORDER — AMLODIPINE BESYLATE 10 MG PO TABS
10.0000 mg | ORAL_TABLET | Freq: Once | ORAL | Status: AC
  Administered 2011-11-27: 10 mg via ORAL
  Filled 2011-11-27: qty 1

## 2011-11-27 MED ORDER — LISINOPRIL-HYDROCHLOROTHIAZIDE 20-25 MG PO TABS: 1.0000 | ORAL_TABLET | Freq: Once | ORAL | Status: DC

## 2011-11-27 MED ORDER — CLONIDINE HCL 0.1 MG PO TABS: 0.1000 mg | ORAL_TABLET | Freq: Two times a day (BID) | ORAL | Status: DC

## 2011-11-27 MED ORDER — AMLODIPINE BESYLATE 10 MG PO TABS: 10.0000 mg | ORAL_TABLET | Freq: Every day | ORAL | Status: DC

## 2011-11-27 MED ORDER — CLONIDINE HCL 0.1 MG PO TABS
0.1000 mg | ORAL_TABLET | Freq: Once | ORAL | Status: AC
  Administered 2011-11-27: 0.1 mg via ORAL
  Filled 2011-11-27: qty 1

## 2011-11-27 MED ORDER — POTASSIUM CHLORIDE CRYS ER 20 MEQ PO TBCR
40.0000 meq | EXTENDED_RELEASE_TABLET | Freq: Once | ORAL | Status: AC
  Administered 2011-11-27: 40 meq via ORAL
  Filled 2011-11-27: qty 2

## 2011-11-27 MED ORDER — HYDROCHLOROTHIAZIDE 25 MG PO TABS
25.0000 mg | ORAL_TABLET | Freq: Once | ORAL | Status: AC
  Administered 2011-11-27: 25 mg via ORAL
  Filled 2011-11-27 (×2): qty 1

## 2011-11-27 MED ORDER — POTASSIUM CHLORIDE CRYS ER 20 MEQ PO TBCR: 20.0000 meq | EXTENDED_RELEASE_TABLET | Freq: Two times a day (BID) | ORAL | Status: DC

## 2011-11-27 MED ORDER — FAMOTIDINE 20 MG PO TABS
20.0000 mg | ORAL_TABLET | Freq: Once | ORAL | Status: AC
  Administered 2011-11-27: 20 mg via ORAL
  Filled 2011-11-27: qty 1

## 2011-11-27 NOTE — ED Notes (Deleted)
Pt c/o mid sternal CP x 2 days worse with palpation; pt sts pain in right mid back and lower back; pt sts some pain in right arm

## 2011-11-27 NOTE — ED Notes (Signed)
Pt c/o increased SOB and right sided CP with radiation down right arm x 2 days

## 2011-11-27 NOTE — ED Provider Notes (Addendum)
History     CSN: [id number]  Arrival date & time 11/27/11  1054   First MD Initiated Contact with Patient 11/27/11 1321      Chief Complaint  Patient presents with  . Chest Pain  . Shortness of Breath    (Consider location/radiation/quality/duration/timing/severity/associated sxs/prior treatment) The history is provided by the patient.  pt c/o mid cp at rest for past 2 weeks, midline, lower sternal area. States symptoms constant x 2 days. Pain present at rest. No change w positional changes. No change w activity, exercise or exertion. No radiation of pain. No associated nv, diaphoresis or sob. Pain is not pleuritic. No leg pain or swelling. No hx dvt or pe. No personal or fam hx cad, reports neg cardiac cath a few years ago [place], [place]). +hx gerd. No cough. No fever/chills. No chest wall injury or strain. Non smoker. No cocaine abuse. Hx htn, states hasnt taken meds yet today.   Past Medical History  Diagnosis Date  . High cholesterol   . Heart murmur     "leaky valve"  . Angina   . Exertional dyspnea   . Sleep apnea 08/2010    "not required to wear mask"  . Gout     "sometimes flares up even w/Allupurinol"  . Hypertension     Past Surgical History  Procedure Date  . Upper gastrointestinal endoscopy 03/2011  . Colonoscopy 03/2011  . Knee arthroscopy 2004    "right; w/ligament repair in kneecap"  . Multiple tooth extractions 06/2010    full mouth  . Tonsillectomy     "I was a kid"  . No past surgeries     Family History  Problem Relation Age of Onset  . Hypertension      History  Substance Use Topics  . Smoking status: Never Smoker   . Smokeless tobacco: Never Used  . Alcohol Use: No      Review of Systems  Constitutional: Negative for fever and chills.  HENT: Negative for neck pain.   Eyes: Negative for redness.  Respiratory: Negative for cough and shortness of breath.   Cardiovascular: Positive for chest pain. Negative for palpitations and leg  swelling.  Gastrointestinal: Negative for vomiting and abdominal pain.  Genitourinary: Negative for flank pain.  Musculoskeletal: Negative for back pain.  Skin: Negative for rash.  Neurological: Negative for headaches.  Hematological: Does not bruise/bleed easily.  Psychiatric/Behavioral: Negative for confusion.    Allergies  Adhesive and Latex  Home Medications   Current Outpatient Rx  Name Route Sig Dispense Refill  . ALLOPURINOL 300 MG PO TABS Oral Take 300 mg by mouth daily.    Marland Kitchen AMLODIPINE BESYLATE 10 MG PO TABS Oral Take 10 mg by mouth daily.    . ASPIRIN 325 MG PO TABS Oral Take 325 mg by mouth daily.    Marland Kitchen CLONIDINE HCL 0.1 MG PO TABS Oral Take 0.1 mg by mouth 2 (two) times daily.    . CYCLOBENZAPRINE HCL 10 MG PO TABS Oral Take 10 mg by mouth daily as needed. Muscle relaxer    . LISINOPRIL-HYDROCHLOROTHIAZIDE 20-25 MG PO TABS Oral Take 1 tablet by mouth daily.    Marland Kitchen OMEPRAZOLE 20 MG PO CPDR Oral Take 20 mg by mouth 2 (two) times daily.      BP 191/139  Pulse 56  Temp 97.4 F (36.3 C) (Oral)  Resp 15  SpO2 99%  Physical Exam  Nursing note and vitals reviewed. Constitutional: He is oriented to person, place, and  time. He appears well-developed and well-nourished. No distress.  HENT:  Head: Atraumatic.  Eyes: Conjunctivae are normal.  Neck: Neck supple. No tracheal deviation present.  Cardiovascular: Normal rate, regular rhythm, normal heart sounds and intact distal pulses.   Pulmonary/Chest: Effort normal and breath sounds normal. No accessory muscle usage. No respiratory distress.  Abdominal: Soft. Bowel sounds are normal. He exhibits no distension. There is no tenderness.  Musculoskeletal: Normal range of motion. He exhibits no edema and no tenderness.  Neurological: He is alert and oriented to person, place, and time.  Skin: Skin is warm and dry.  Psychiatric: He has a normal mood and affect.    ED Course  Procedures (including critical care time)  Results  for orders placed during the hospital encounter of 11/27/11  CBC      Component Value Range   WBC 5.2  4.0 - 10.5 K/uL   RBC 5.96 (*) 4.22 - 5.81 MIL/uL   Hemoglobin 14.5  13.0 - 17.0 g/dL   HCT 47.8  29.5 - 62.1 %   MCV 71.8 (*) 78.0 - 100.0 fL   MCH 24.3 (*) 26.0 - 34.0 pg   MCHC 33.9  30.0 - 36.0 g/dL   RDW 30.8  65.7 - 84.6 %   Platelets 186  150 - 400 K/uL  BASIC METABOLIC PANEL      Component Value Range   Sodium 138  135 - 145 mEq/L   Potassium 3.1 (*) 3.5 - 5.1 mEq/L   Chloride 100  96 - 112 mEq/L   CO2 25  19 - 32 mEq/L   Glucose, Bld 114 (*) 70 - 99 mg/dL   BUN 7  6 - 23 mg/dL   Creatinine, Ser 9.62  0.50 - 1.35 mg/dL   Calcium 95.2  8.4 - 84.1 mg/dL   GFR calc non Af Amer >90  >90 mL/min   GFR calc Af Amer >90  >90 mL/min  POCT I-STAT TROPONIN I      Component Value Range   Troponin i, poc 0.00  0.00 - 0.08 ng/mL   Comment 3           POCT I-STAT TROPONIN I      Component Value Range   Troponin i, poc 0.01  0.00 - 0.08 ng/mL   Comment 3            Dg Chest 2 View  11/27/2011  *RADIOLOGY REPORT*  Clinical Data: Chest pain, right arm numbness  CHEST - 2 VIEW  Comparison: 08/26/2011  Findings: Lungs are clear. No pleural effusion or pneumothorax.  Cardiomediastinal silhouette is within normal limits.  Visualized osseous structures are within normal limits.  IMPRESSION: Normal chest radiographs.   Original Report Authenticated By: Charline Bills, M.D.       MDM  Labs. Ecg.  Cxr.  Pt notes hx 'bad reflux'. Pepcid, gi cocktail, vicodin po.   Date: 11/27/2011  Rate: 77  Rhythm: normal sinus rhythm  QRS Axis: right  Intervals: normal  ST/T Wave abnormalities: normal  Conduction Disutrbances:none  Narrative Interpretation:   Old EKG Reviewed: unchanged   k low, kcl po.    bp high, pt states had been compliant w meds, but hasnt taken any of his bp meds yet today. Will give pt the normal dose of his bp meds.    Pt reports 2 neg/normal cardiac caths in  past ?2-3 yrs ago, and 6-7 yrs ago. Unable to obtain results.  After constant/continual cp for 2 days, trop  x 2 negative.   cxr neg.   Pt reports hx gerd, will continue prilosec, pepcid/maalox prn.   Will refer to pcp f/u, incl for bp and low k. Will also give card f/u re possible stress testing. Gi f/u as well provided.   Reviewed bp meds w pt, and d/c summary from med service 5/13 - verified doses pts bp meds. Pt states has just /ran out' of his bp meds.  States pcp is [facility], has no local cardiologist or gi md.   bp still high but improved post giving todays meds, 170/108 on recheck. No headache. No cp.    Suzi Roots, MD 11/27/11 1610  Suzi Roots, MD 11/27/11 [phone]

## 2013-08-30 ENCOUNTER — Emergency Department (HOSPITAL_COMMUNITY): Payer: No Typology Code available for payment source

## 2013-08-30 ENCOUNTER — Encounter (HOSPITAL_COMMUNITY): Payer: Self-pay | Admitting: Emergency Medicine

## 2013-08-30 ENCOUNTER — Inpatient Hospital Stay (HOSPITAL_COMMUNITY)
Admission: EM | Admit: 2013-08-30 | Discharge: 2013-09-01 | DRG: 305 | Disposition: A | Discharge status: Home / Self Care | Payer: No Typology Code available for payment source | Attending: Internal Medicine | Admitting: Internal Medicine

## 2013-08-30 DIAGNOSIS — I160 Hypertensive urgency: Secondary | ICD-10-CM

## 2013-08-30 DIAGNOSIS — I509 Heart failure, unspecified: Secondary | ICD-10-CM | POA: Diagnosis present

## 2013-08-30 DIAGNOSIS — I169 Hypertensive crisis, unspecified: Secondary | ICD-10-CM | POA: Diagnosis present

## 2013-08-30 DIAGNOSIS — Z91199 Patient's noncompliance with other medical treatment and regimen due to unspecified reason: Secondary | ICD-10-CM

## 2013-08-30 DIAGNOSIS — I359 Nonrheumatic aortic valve disorder, unspecified: Secondary | ICD-10-CM | POA: Diagnosis present

## 2013-08-30 DIAGNOSIS — E785 Hyperlipidemia, unspecified: Secondary | ICD-10-CM | POA: Diagnosis present

## 2013-08-30 DIAGNOSIS — I10 Essential (primary) hypertension: Principal | ICD-10-CM | POA: Diagnosis present

## 2013-08-30 DIAGNOSIS — S272XXA Traumatic hemopneumothorax, initial encounter: Secondary | ICD-10-CM

## 2013-08-30 DIAGNOSIS — Z79899 Other long term (current) drug therapy: Secondary | ICD-10-CM

## 2013-08-30 DIAGNOSIS — Z9119 Patient's noncompliance with other medical treatment and regimen: Secondary | ICD-10-CM

## 2013-08-30 DIAGNOSIS — S21119A Laceration without foreign body of unspecified front wall of thorax without penetration into thoracic cavity, initial encounter: Secondary | ICD-10-CM

## 2013-08-30 DIAGNOSIS — I350 Nonrheumatic aortic (valve) stenosis: Secondary | ICD-10-CM

## 2013-08-30 DIAGNOSIS — G4730 Sleep apnea, unspecified: Secondary | ICD-10-CM | POA: Diagnosis present

## 2013-08-30 DIAGNOSIS — I161 Hypertensive emergency: Secondary | ICD-10-CM | POA: Diagnosis present

## 2013-08-30 DIAGNOSIS — M109 Gout, unspecified: Secondary | ICD-10-CM | POA: Diagnosis present

## 2013-08-30 DIAGNOSIS — D62 Acute posthemorrhagic anemia: Secondary | ICD-10-CM

## 2013-08-30 DIAGNOSIS — I5032 Chronic diastolic (congestive) heart failure: Secondary | ICD-10-CM | POA: Diagnosis present

## 2013-08-30 DIAGNOSIS — K219 Gastro-esophageal reflux disease without esophagitis: Secondary | ICD-10-CM | POA: Diagnosis present

## 2013-08-30 DIAGNOSIS — S21219A Laceration without foreign body of unspecified back wall of thorax without penetration into thoracic cavity, initial encounter: Secondary | ICD-10-CM

## 2013-08-30 DIAGNOSIS — R079 Chest pain, unspecified: Secondary | ICD-10-CM | POA: Diagnosis present

## 2013-08-30 DIAGNOSIS — Z7982 Long term (current) use of aspirin: Secondary | ICD-10-CM

## 2013-08-30 HISTORY — DX: Gastro-esophageal reflux disease without esophagitis: K21.9

## 2013-08-30 LAB — CREATININE, SERUM
CREATININE: 0.94 mg/dL (ref 0.50–1.35)
GFR calc Af Amer: >90 mL/min (ref >90)
GFR calc non Af Amer: >90 mL/min (ref >90)

## 2013-08-30 LAB — CBC
HCT: 41 % (ref 39.0–52.0)
HEMATOCRIT: 43.5 % (ref 39.0–52.0)
Hemoglobin: 14.5 g/dL (ref 13.0–17.0)
Hemoglobin: 13.9 g/dL (ref 13.0–17.0)
MCH: 24.3 pg — AB (ref 26.0–34.0)
MCH: 24.5 pg — AB (ref 26.0–34.0)
MCHC: 33.3 g/dL (ref 30.0–36.0)
MCHC: 33.9 g/dL (ref 30.0–36.0)
MCV: 72.9 fL — AB (ref 78.0–100.0)
MCV: 72.2 fL — ABNORMAL LOW (ref 78.0–100.0)
PLATELETS: 135 10*3/uL — AB (ref 150–400)
Platelets: 150 10*3/uL (ref 150–400)
RBC: 5.97 MIL/uL — ABNORMAL HIGH (ref 4.22–5.81)
RBC: 5.68 MIL/uL (ref 4.22–5.81)
RDW: 13.8 % (ref 11.5–15.5)
RDW: 13.7 % (ref 11.5–15.5)
WBC: 5.4 10*3/uL (ref 4.0–10.5)
WBC: 5.9 10*3/uL (ref 4.0–10.5)

## 2013-08-30 LAB — BASIC METABOLIC PANEL
BUN: 10 mg/dL (ref 6–23)
CALCIUM: 9.6 mg/dL (ref 8.4–10.5)
CO2: 25 mEq/L (ref 19–32)
Chloride: 101 mEq/L (ref 96–112)
Creatinine, Ser: 0.84 mg/dL (ref 0.50–1.35)
GFR calc non Af Amer: >90 mL/min (ref >90)
Glucose, Bld: 107 mg/dL — ABNORMAL HIGH (ref 70–99)
Potassium: 3.5 mEq/L — ABNORMAL LOW (ref 3.7–5.3)
Sodium: 140 mEq/L (ref 137–147)

## 2013-08-30 LAB — RAPID URINE DRUG SCREEN, HOSP PERFORMED
Amphetamines: NOT DETECTED
BARBITURATES: NOT DETECTED
BENZODIAZEPINES: NOT DETECTED
Cocaine: NOT DETECTED
Opiates: NOT DETECTED
TETRAHYDROCANNABINOL: NOT DETECTED

## 2013-08-30 LAB — I-STAT TROPONIN, ED: TROPONIN I, POC: 0.01 ng/mL (ref 0.00–0.08)

## 2013-08-30 LAB — TROPONIN I
Troponin I: <0.3 ng/mL (ref <0.30)
Troponin I: <0.3 ng/mL (ref <0.30)
Troponin I: <0.3 ng/mL (ref <0.30)

## 2013-08-30 LAB — PRO B NATRIURETIC PEPTIDE: Pro B Natriuretic peptide (BNP): 183.7 pg/mL — ABNORMAL HIGH (ref 0–125)

## 2013-08-30 MED ORDER — CLONIDINE HCL 0.2 MG PO TABS
0.2000 mg | ORAL_TABLET | Freq: Three times a day (TID) | ORAL | Status: DC
  Administered 2013-08-30 (×3): 0.2 mg via ORAL
  Filled 2013-08-30 (×7): qty 1

## 2013-08-30 MED ORDER — CLONIDINE HCL 0.2 MG PO TABS
0.2000 mg | ORAL_TABLET | Freq: Once | ORAL | Status: AC
  Administered 2013-08-30: 0.2 mg via ORAL
  Filled 2013-08-30: qty 1

## 2013-08-30 MED ORDER — ALUM & MAG HYDROXIDE-SIMETH 200-200-20 MG/5ML PO SUSP
30.0000 mL | Freq: Four times a day (QID) | ORAL | Status: DC | PRN
Start: 2013-08-30 — End: 2013-09-01

## 2013-08-30 MED ORDER — ONDANSETRON HCL 4 MG PO TABS: 4.0000 mg | ORAL_TABLET | Freq: Four times a day (QID) | ORAL | Status: DC | PRN

## 2013-08-30 MED ORDER — GUAIFENESIN-DM 100-10 MG/5ML PO SYRP
5.0000 mL | ORAL_SOLUTION | ORAL | Status: DC | PRN
Start: 2013-08-30 — End: 2013-09-01

## 2013-08-30 MED ORDER — HYDRALAZINE HCL 50 MG PO TABS: 100.0000 mg | ORAL_TABLET | Freq: Three times a day (TID) | ORAL | Status: DC

## 2013-08-30 MED ORDER — ACETAMINOPHEN 650 MG RE SUPP: 650.0000 mg | Freq: Four times a day (QID) | RECTAL | Status: DC | PRN

## 2013-08-30 MED ORDER — HYDRALAZINE HCL 20 MG/ML IJ SOLN: 10.0000 mg | Freq: Four times a day (QID) | INTRAMUSCULAR | Status: DC | PRN

## 2013-08-30 MED ORDER — ASPIRIN 81 MG PO CHEW
324.0000 mg | CHEWABLE_TABLET | Freq: Once | ORAL | Status: AC
  Administered 2013-08-30: 324 mg via ORAL
  Filled 2013-08-30: qty 4

## 2013-08-30 MED ORDER — POTASSIUM CHLORIDE CRYS ER 20 MEQ PO TBCR
40.0000 meq | EXTENDED_RELEASE_TABLET | Freq: Once | ORAL | Status: AC
  Administered 2013-08-30: 40 meq via ORAL
  Filled 2013-08-30: qty 2

## 2013-08-30 MED ORDER — ACETAMINOPHEN 325 MG PO TABS
650.0000 mg | ORAL_TABLET | Freq: Four times a day (QID) | ORAL | Status: DC | PRN
  Filled 2013-08-30: qty 2

## 2013-08-30 MED ORDER — HYDROCODONE-ACETAMINOPHEN 5-325 MG PO TABS: 1.0000 | ORAL_TABLET | ORAL | Status: DC | PRN

## 2013-08-30 MED ORDER — HEPARIN SODIUM (PORCINE) 5000 UNIT/ML IJ SOLN
5000.0000 [IU] | Freq: Three times a day (TID) | INTRAMUSCULAR | Status: DC
  Administered 2013-08-30: 5000 [IU] via SUBCUTANEOUS
  Filled 2013-08-30: qty 1

## 2013-08-30 MED ORDER — NITROGLYCERIN IN D5W 200-5 MCG/ML-% IV SOLN
5.0000 ug/min | INTRAVENOUS | Status: DC
  Administered 2013-08-30: 5 ug/min via INTRAVENOUS
  Filled 2013-08-30: qty 250

## 2013-08-30 MED ORDER — AMLODIPINE BESYLATE 10 MG PO TABS
10.0000 mg | ORAL_TABLET | Freq: Every day | ORAL | Status: DC
  Administered 2013-08-30: 10 mg via ORAL
  Filled 2013-08-30 (×2): qty 1

## 2013-08-30 MED ORDER — MORPHINE SULFATE 2 MG/ML IJ SOLN: 2.0000 mg | INTRAMUSCULAR | Status: DC | PRN

## 2013-08-30 MED ORDER — HYDRALAZINE HCL 25 MG PO TABS: 25.0000 mg | ORAL_TABLET | Freq: Three times a day (TID) | ORAL | Status: DC

## 2013-08-30 MED ORDER — ALLOPURINOL 300 MG PO TABS
300.0000 mg | ORAL_TABLET | Freq: Every day | ORAL | Status: DC
  Administered 2013-08-30 – 2013-09-01 (×3): 300 mg via ORAL
  Filled 2013-08-30 (×3): qty 1

## 2013-08-30 MED ORDER — HEPARIN SODIUM (PORCINE) 5000 UNIT/ML IJ SOLN
5000.0000 [IU] | Freq: Three times a day (TID) | INTRAMUSCULAR | Status: DC
  Administered 2013-08-30 – 2013-08-31 (×4): 5000 [IU] via SUBCUTANEOUS
  Filled 2013-08-30 (×6): qty 1

## 2013-08-30 MED ORDER — ONDANSETRON HCL 4 MG/2ML IJ SOLN: 4.0000 mg | Freq: Four times a day (QID) | INTRAMUSCULAR | Status: DC | PRN

## 2013-08-30 MED ORDER — POLYETHYLENE GLYCOL 3350 17 G PO PACK
17.0000 g | PACK | Freq: Every day | ORAL | Status: DC | PRN
  Filled 2013-08-30: qty 1

## 2013-08-30 MED ORDER — ASPIRIN 81 MG PO CHEW
81.0000 mg | CHEWABLE_TABLET | Freq: Every day | ORAL | Status: DC
  Administered 2013-08-31 – 2013-09-01 (×2): 81 mg via ORAL
  Filled 2013-08-30 (×2): qty 1

## 2013-08-30 MED ORDER — PANTOPRAZOLE SODIUM 40 MG PO TBEC
40.0000 mg | DELAYED_RELEASE_TABLET | Freq: Every day | ORAL | Status: DC
  Administered 2013-08-30 – 2013-09-01 (×3): 40 mg via ORAL
  Filled 2013-08-30 (×3): qty 1

## 2013-08-30 MED ORDER — MORPHINE SULFATE 4 MG/ML IJ SOLN
4.0000 mg | Freq: Once | INTRAMUSCULAR | Status: AC
  Administered 2013-08-30: 4 mg via INTRAVENOUS
  Filled 2013-08-30: qty 1

## 2013-08-30 MED ORDER — HYDRALAZINE HCL 50 MG PO TABS
50.0000 mg | ORAL_TABLET | Freq: Three times a day (TID) | ORAL | Status: DC
  Administered 2013-08-30 – 2013-09-01 (×7): 50 mg via ORAL
  Filled 2013-08-30 (×9): qty 1

## 2013-08-30 NOTE — ED Notes (Signed)
Admitting MD at bedside.
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
## 2013-08-30 NOTE — ED Notes (Signed)
Patient denies pain and is resting comfortably.  

## 2013-08-30 NOTE — ED Notes (Addendum)
Pt is resting comfortably with family at bedside; no signs of distress; chest pain free. Family at bedside.

## 2013-08-30 NOTE — Progress Notes (Signed)
Report given to receiving RN. Patient in bed watching TV. No verbal complaints and no signs or symptoms of distress or discomfort. 

## 2013-08-30 NOTE — ED Provider Notes (Addendum)
CSN: [id number]     Arrival date & time 08/30/13  0405 History   First MD Initiated Contact with Patient 08/30/13 [phone]     Chief Complaint  Patient presents with  . Chest Pain     (Consider location/radiation/quality/duration/timing/severity/associated sxs/prior Treatment) HPI 53 year old male presents to emergency department from home with complaint of chest pain, headache.  Patient reports chest pain started tonight ago.  Pain is described as a sharp pressure to left side of his chest.  He has shortness of breath and diaphoresis with the pain.  He has felt dizzy with the pain.  Pain slowly resolved.  Pain returned again tonight and has been persistent but intermittent.  Patient noted be hypertensive, has history of same.  He reports he's been out of all his blood pressure medicines for several months.  Patient reports he has dyspnea on exertion, and mild orthopnea.  Patient also complains of headache.  Headache is a band around his eyes to the back of his head.  He denies any weakness numbness difficulties walking or speaking. Past Medical History  Diagnosis Date  . High cholesterol   . Heart murmur     "leaky valve"  . Angina   . Exertional dyspnea   . Sleep apnea 08/2010    "not required to wear mask"  . Gout     "sometimes flares up even w/Allupurinol"  . Hypertension    Past Surgical History  Procedure Laterality Date  . Upper gastrointestinal endoscopy  03/2011  . Colonoscopy  03/2011  . Knee arthroscopy  2004    "right; w/ligament repair in kneecap"  . Multiple tooth extractions  06/2010    full mouth  . Tonsillectomy      "I was a kid"  . No past surgeries     Family History  Problem Relation Age of Onset  . Hypertension     History  Substance Use Topics  . Smoking status: Never Smoker   . Smokeless tobacco: Never Used  . Alcohol Use: No    Review of Systems  See History of Present Illness; otherwise all other systems are reviewed and negative   Allergies   Adhesive and Latex  Home Medications   Prior to Admission medications   Medication Sig Start Date End Date Taking? Authorizing Provider  allopurinol (ZYLOPRIM) 300 MG tablet Take 300 mg by mouth daily.   Yes Historical Provider, MD  amLODipine (NORVASC) 10 MG tablet Take 10 mg by mouth daily.   Yes Historical Provider, MD  aspirin 325 MG tablet Take 325 mg by mouth daily.   Yes Historical Provider, MD  cloNIDine (CATAPRES) 0.1 MG tablet Take 0.1 mg by mouth 2 (two) times daily.   Yes Historical Provider, MD  cyclobenzaprine (FLEXERIL) 10 MG tablet Take 10 mg by mouth daily as needed. Muscle relaxer   Yes Historical Provider, MD  lisinopril-hydrochlorothiazide (PRINZIDE,ZESTORETIC) 20-25 MG per tablet Take 1 tablet by mouth daily.   Yes Historical Provider, MD  omeprazole (PRILOSEC) 20 MG capsule Take 20 mg by mouth 2 (two) times daily.   Yes Historical Provider, MD  potassium chloride SA (K-DUR,KLOR-CON) 20 MEQ tablet Take 1 tablet (20 mEq total) by mouth 2 (two) times daily. One po bid x 3 days, then one po once a day 11/27/11 11/26/12  Suzi Roots, MD   BP 173/121  Pulse 82  Temp(Src) 98.4 F (36.9 C) (Oral)  Resp 28  SpO2 93% Physical Exam  Nursing note and vitals reviewed. Constitutional: He  is oriented to person, place, and time. He appears well-developed and well-nourished. He appears distressed (uncomfortable appearing).  HENT:  Head: Normocephalic and atraumatic.  Right Ear: External ear normal.  Left Ear: External ear normal.  Nose: Nose normal.  Mouth/Throat: Oropharynx is clear and moist.  Eyes: Conjunctivae and EOM are normal. Pupils are equal, round, and reactive to light.  Neck: Normal range of motion. Neck supple. No JVD present. No tracheal deviation present. No thyromegaly present.  Cardiovascular: Normal rate, regular rhythm, normal heart sounds and intact distal pulses.  Exam reveals no gallop and no friction rub.   No murmur heard. Pulmonary/Chest: Effort  normal. No stridor. No respiratory distress. He has no wheezes. He has rales (in bases minimal). He exhibits no tenderness.  Abdominal: Soft. Bowel sounds are normal. He exhibits no distension and no mass. There is no tenderness. There is no rebound and no guarding.  Musculoskeletal: Normal range of motion. He exhibits no edema and no tenderness.  Lymphadenopathy:    He has no cervical adenopathy.  Neurological: He is alert and oriented to person, place, and time. He has normal reflexes. No cranial nerve deficit. He exhibits normal muscle tone. Coordination normal.  Skin: Skin is warm and dry. No rash noted. No erythema. No pallor.  Psychiatric: He has a normal mood and affect. His behavior is normal. Judgment and thought content normal.    ED Course  Procedures (including critical care time) Labs Review Labs Reviewed  CBC - Abnormal; Notable for the following:    RBC 5.97 (*)    MCV 72.9 (*)    MCH 24.3 (*)    All other components within normal limits  BASIC METABOLIC PANEL - Abnormal; Notable for the following:    Potassium 3.5 (*)    Glucose, Bld 107 (*)    All other components within normal limits  PRO B NATRIURETIC PEPTIDE - Abnormal; Notable for the following:    Pro B Natriuretic peptide (BNP) 183.7 (*)    All other components within normal limits  Rosezena Sensor, ED    Imaging Review Dg Chest 2 View  08/30/2013   CLINICAL DATA:  Sharp left-sided chest pain for 2 days.  Dry cough.  EXAM: CHEST  2 VIEW  COMPARISON:  Chest radiograph performed 11/27/2011  FINDINGS: The lungs are well-aerated and clear. There is no evidence of focal opacification, pleural effusion or pneumothorax.  The heart is normal in size; the mediastinal contour is within normal limits. No acute osseous abnormalities are seen.  IMPRESSION: No acute cardiopulmonary process seen.   Electronically Signed   By: Roanna Raider M.D.   On: 08/30/2013 05:24     EKG Interpretation   Date/Time:  Friday Aug 30 2013 04:07:58 EDT Ventricular Rate:  81 PR Interval:  156 QRS Duration: 90 QT Interval:  428 QTC Calculation: 497 R Axis:   -41 Text Interpretation:  Normal sinus rhythm Possible Left atrial enlargement  Left axis deviation Septal infarct , age undetermined Abnormal ECG axis  has changed from prior ekg Confirmed by Elbridge Magowan  MD, Nasreen Goedecke ([id number]) on  08/30/2013 4:36:33 AM      MDM   Final diagnoses:  Hypertensive urgency  Chest pain    53 year old male with hypertensive urgency with chest pain, headache.  Plan for labs, nitroglycerin drip aspirin.  Patient reports he has had negative cath within the last 5 years.  He denies any previous known coronary disease.  No family history.  Patient is not complaint with  his blood pressure medications.  6:25 AM Chest pain is nearly resolved.  Blood pressure only slightly improved with nitro drip.  Plan to d/w the hospitalist for admission to the hospital for further workup of this chest pain and management of his blood pressure.    Olivia Mackielga M Tomiko Schoon, MD 08/30/13 [id number]  Olivia Mackielga M Selin Eisler, MD 08/30/13 [id number]

## 2013-08-30 NOTE — ED Notes (Signed)
Patient arrives from home with complaint of chest pain starting yesterday. Multiple accompanying symptoms as listed. States nothing makes it better or worse.

## 2013-08-30 NOTE — ED Notes (Signed)
Dr. Otter at the bedside.  

## 2013-08-30 NOTE — ED Notes (Signed)
Wife states he came from work and picked her up and came here for cp, headache.

## 2013-08-30 NOTE — H&P (Signed)
Patient Demographics  [name], is a 53 y.o. male  MRN: [id number]   DOB - 1960/12/27  Admit Date - 08/30/2013  Outpatient Primary MD for the patient is Dorrene German, MD   With History of -  Past Medical History  Diagnosis Date  . High cholesterol   . Heart murmur     "leaky valve"  . Angina   . Exertional dyspnea   . Sleep apnea 08/2010    "not required to wear mask"  . Gout     "sometimes flares up even w/Allupurinol"  . Hypertension       Past Surgical History  Procedure Laterality Date  . Upper gastrointestinal endoscopy  03/2011  . Colonoscopy  03/2011  . Knee arthroscopy  2004    "right; w/ligament repair in kneecap"  . Multiple tooth extractions  06/2010    full mouth  . Tonsillectomy      "I was a kid"  . No past surgeries      in for   Chief Complaint  Patient presents with  . Chest Pain     HPI  [name]  is a 53 y.o. male, with history of hypertension, dyslipidemia, gout, chronic diastolic heart failure, mild aortic stenosis noted on echogram 2013, who is noncompliant with his medications and ran out of his medications several months ago, he is noted his blood pressure running high, this morning at work he experienced a 5-10 minute episode of left-sided chest pressure radiating to his left arm, made him little short of breath and sweaty, he then came to the ER where his blood pressure was noted to be 210/110, he was diagnosed with hypertensive crisis and hospitalist team was requested to admit the patient.  In the ER patient has been started on nitro drip with improvement in blood pressure, he is completely symptom free now, he says that he ran out of his medications due to insurance issues however he has history of noncompliance in the past as well, denies any headache, no fever  chills, no cough shortness of breath, no palpitations, he has good exertional capacity and has noticed no chest discomfort with it in the last several months, denies any abdominal pain, no blood in stool or urine or dysuria. No focal weakness .    Review of Systems    In addition to the HPI above,   No Fever-chills, No Headache, No changes with Vision or hearing, No problems swallowing food or Liquids, As above Chest pain, Cough or Shortness of Breath, No Abdominal pain, No Nausea or Vommitting, Bowel movements are regular, No Blood in stool or Urine, No dysuria, No new skin rashes or bruises, No new joints pains-aches,  No new weakness, tingling, numbness in any extremity, No recent weight gain or loss, No polyuria, polydypsia or polyphagia, No significant Mental Stressors.  A full 10 point Review of Systems was done, except as stated above, all other Review of Systems were  negative.   Social History History  Substance Use Topics  . Smoking status: Never Smoker   . Smokeless tobacco: Never Used  . Alcohol Use: No      Family History Family History  Problem Relation Age of Onset  . Hypertension        Prior to Admission medications   Medication Sig Start Date End Date Taking? Authorizing Provider  allopurinol (ZYLOPRIM) 300 MG tablet Take 300 mg by mouth daily.   Yes Historical Provider, MD  amLODipine (NORVASC) 10 MG tablet Take 10 mg by mouth daily.   Yes Historical Provider, MD  aspirin 325 MG tablet Take 325 mg by mouth daily.   Yes Historical Provider, MD  cloNIDine (CATAPRES) 0.1 MG tablet Take 0.1 mg by mouth 2 (two) times daily.   Yes Historical Provider, MD  cyclobenzaprine (FLEXERIL) 10 MG tablet Take 10 mg by mouth daily as needed. Muscle relaxer   Yes Historical Provider, MD  lisinopril-hydrochlorothiazide (PRINZIDE,ZESTORETIC) 20-25 MG per tablet Take 1 tablet by mouth daily.   Yes Historical Provider, MD  omeprazole (PRILOSEC) 20 MG capsule Take 20 mg by  mouth 2 (two) times daily.   Yes Historical Provider, MD  potassium chloride SA (K-DUR,KLOR-CON) 20 MEQ tablet Take 1 tablet (20 mEq total) by mouth 2 (two) times daily. One po bid x 3 days, then one po once a day 11/27/11 11/26/12  Suzi RootsKevin E Steinl, MD    Allergies  Allergen Reactions  . Adhesive [Tape] Other (See Comments)    Makes the skin feel as if it is burning, will also bruise the skin.  . Latex Hives and Itching    Burns skin    Physical Exam  Vitals  Blood pressure 133/92, pulse 70, temperature 98.4 F (36.9 C), temperature source Oral, resp. rate 17, SpO2 93.00%.   1. General middle-aged African American male lying in bed in NAD,    2. Normal affect and insight, Not Suicidal or Homicidal, Awake Alert, Oriented X 3.  3. No F.N deficits, ALL C.Nerves Intact, Strength 5/5 all 4 extremities, Sensation intact all 4 extremities, Plantars down going.  4. Ears and Eyes appear Normal, Conjunctivae clear, PERRLA. Moist Oral Mucosa.  5. Supple Neck, No JVD, No cervical lymphadenopathy appriciated, No Carotid Bruits.  6. Symmetrical Chest wall movement, Good air movement bilaterally, CTAB.  7. RRR, No Gallops, Rubs or Murmurs, No Parasternal Heave.  8. Positive Bowel Sounds, Abdomen Soft, No tenderness, No organomegaly appriciated,No rebound -guarding or rigidity.  9.  No Cyanosis, Normal Skin Turgor, No Skin Rash or Bruise.  10. Good muscle tone,  joints appear normal , no effusions, Normal ROM.  11. No Palpable Lymph Nodes in Neck or Axillae     Data Review  CBC  Recent Labs Lab 08/30/13 0415  WBC 5.4  HGB 14.5  HCT 43.5  PLT 150  MCV 72.9*  MCH 24.3*  MCHC 33.3  RDW 13.8   ------------------------------------------------------------------------------------------------------------------  Chemistries   Recent Labs Lab 08/30/13 0415  NA 140  K 3.5*  CL 101  CO2 25  GLUCOSE 107*  BUN 10  CREATININE 0.84  CALCIUM 9.6    ------------------------------------------------------------------------------------------------------------------ CrCl is unknown because both a height and weight (above a minimum accepted value) are required for this calculation. ------------------------------------------------------------------------------------------------------------------ No results found for this basename: TSH, T4TOTAL, FREET3, T3FREE, THYROIDAB,  in the last 72 hours   Coagulation profile No results found for this basename: INR, PROTIME,  in the last 168 hours ------------------------------------------------------------------------------------------------------------------- No results  found for this basename: DDIMER,  in the last 72 hours -------------------------------------------------------------------------------------------------------------------  Cardiac Enzymes No results found for this basename: CK, CKMB, TROPONINI, MYOGLOBIN,  in the last 168 hours ------------------------------------------------------------------------------------------------------------------ No components found with this basename: POCBNP,    ---------------------------------------------------------------------------------------------------------------  Urinalysis    Component Value Date/Time   COLORURINE YELLOW 10/16/2011 1833   APPEARANCEUR CLEAR 10/16/2011 1833   LABSPEC 1.017 10/16/2011 1833   PHURINE 5.0 10/16/2011 1833   GLUCOSEU NEGATIVE 10/16/2011 1833   HGBUR NEGATIVE 10/16/2011 1833   BILIRUBINUR NEGATIVE 10/16/2011 1833   KETONESUR NEGATIVE 10/16/2011 1833   PROTEINUR NEGATIVE 10/16/2011 1833   UROBILINOGEN 0.2 10/16/2011 1833   NITRITE NEGATIVE 10/16/2011 1833   LEUKOCYTESUR NEGATIVE 10/16/2011 1833    ----------------------------------------------------------------------------------------------------------------  Imaging results:   Dg Chest 2 View  08/30/2013   CLINICAL DATA:  [name].   Dry cough.  EXAM: CHEST  2 VIEW  COMPARISON:  Chest radiograph performed 11/27/2011  FINDINGS: The lungs are well-aerated and clear. There is no evidence of focal opacification, pleural effusion or pneumothorax.  The heart is normal in size; the mediastinal contour is within normal limits. No acute osseous abnormalities are seen.  IMPRESSION: No acute cardiopulmonary process seen.   Electronically Signed   By: Roanna Raider M.D.   On: 08/30/2013 05:24    My personal review of EKG: Rhythm NSR, Rate  81 /min,   no Acute ST changes    Assessment & Plan   1. Hypertensive crisis with chest pain. Chest pain resolved with improvement in blood pressure, we'll start him on Catapres and Norvasc orally in the ER, continue nitroglycerin drip IV, once blood pressure is below 160/100 for an hour nitro drip can be titrated off. Will add as needed IV hydrazine and adjust medications as needed. He has been counseled on compliance.    2. Chest pain likely due to #1 above. Completely chest pain-free now, EKG is nonacute, will cycle troponins and repeat baseline echo gram as he has history of LVH and mild aortic stenosis.    3. Chronic diastolic CHF with mild aortic stenosis. Stable repeat echo gram.    4. History of gout. Commence allopurinol.    5. History of dyslipidemia. Outpatient followup no acute issues. Not on medications.    6. GERD. Continue PPI.    DVT Prophylaxis Heparin   AM Labs Ordered, also please review Full Orders  Family Communication: Admission, patients condition and plan of care including tests being ordered have been discussed with the patient and wife who indicate understanding and agree with the plan and Code Status.  Code Status Full  Likely DC to  Home  Condition Fair  Time spent in minutes : 35    Leroy Sea M.D on 08/30/2013 at 7:54 AM  Between 7am to 7pm - Pager - [phone]  After 7pm go to [email or web address] - password [email or web address]  And look for the  night coverage person covering me after hours  [facility] Office  [phone]   **Disclaimer: This note may have been dictated with voice recognition software. Similar sounding words can inadvertently be transcribed and this note may contain transcription errors which may not have been corrected upon publication of note.**

## 2013-08-31 DIAGNOSIS — I517 Cardiomegaly: Secondary | ICD-10-CM

## 2013-08-31 DIAGNOSIS — I161 Hypertensive emergency: Secondary | ICD-10-CM | POA: Diagnosis present

## 2013-08-31 HISTORY — DX: Hypertensive emergency: I16.1

## 2013-08-31 LAB — BASIC METABOLIC PANEL
BUN: 12 mg/dL (ref 6–23)
CALCIUM: 9.2 mg/dL (ref 8.4–10.5)
CO2: 24 mEq/L (ref 19–32)
Chloride: 103 mEq/L (ref 96–112)
Creatinine, Ser: 0.92 mg/dL (ref 0.50–1.35)
GFR calc Af Amer: >90 mL/min (ref >90)
GFR calc non Af Amer: >90 mL/min (ref >90)
GLUCOSE: 111 mg/dL — AB (ref 70–99)
Potassium: 3.8 mEq/L (ref 3.7–5.3)
Sodium: 138 mEq/L (ref 137–147)

## 2013-08-31 LAB — CBC
HCT: 40.9 % (ref 39.0–52.0)
Hemoglobin: 13.6 g/dL (ref 13.0–17.0)
MCH: 24.3 pg — AB (ref 26.0–34.0)
MCHC: 33.3 g/dL (ref 30.0–36.0)
MCV: 73.2 fL — AB (ref 78.0–100.0)
PLATELETS: 135 10*3/uL — AB (ref 150–400)
RBC: 5.59 MIL/uL (ref 4.22–5.81)
RDW: 13.9 % (ref 11.5–15.5)
WBC: 5.3 10*3/uL (ref 4.0–10.5)

## 2013-08-31 LAB — RETICULOCYTES
RBC.: 5.9 MIL/uL — ABNORMAL HIGH (ref 4.22–5.81)
Retic Count, Absolute: 64.9 10*3/uL (ref 19.0–186.0)
Retic Ct Pct: 1.1 % (ref 0.4–3.1)

## 2013-08-31 LAB — FERRITIN: FERRITIN: 321 ng/mL (ref 22–322)

## 2013-08-31 LAB — VITAMIN B12: Vitamin B-12: 835 pg/mL (ref 211–911)

## 2013-08-31 LAB — IRON AND TIBC
Iron: 73 ug/dL (ref 42–135)
SATURATION RATIOS: 30 % (ref 20–55)
TIBC: 247 ug/dL (ref 215–435)
UIBC: 174 ug/dL (ref 125–400)

## 2013-08-31 LAB — FOLATE: Folate: >20 ng/mL

## 2013-08-31 MED ORDER — LISINOPRIL 20 MG PO TABS
20.0000 mg | ORAL_TABLET | Freq: Every day | ORAL | Status: DC
  Administered 2013-08-31 – 2013-09-01 (×2): 20 mg via ORAL
  Filled 2013-08-31 (×2): qty 1

## 2013-08-31 MED ORDER — AMLODIPINE BESYLATE 10 MG PO TABS
10.0000 mg | ORAL_TABLET | Freq: Every day | ORAL | Status: DC
  Administered 2013-08-31 – 2013-09-01 (×2): 10 mg via ORAL
  Filled 2013-08-31 (×2): qty 1

## 2013-08-31 MED ORDER — LISINOPRIL 5 MG PO TABS: 20.0000 mg | ORAL_TABLET | Freq: Every day | ORAL | Status: DC

## 2013-08-31 MED ORDER — AMLODIPINE BESYLATE 10 MG PO TABS: 10.0000 mg | ORAL_TABLET | Freq: Every day | ORAL | Status: DC

## 2013-08-31 MED ORDER — CLONIDINE HCL 0.1 MG PO TABS
0.1000 mg | ORAL_TABLET | Freq: Two times a day (BID) | ORAL | Status: DC
  Administered 2013-08-31 – 2013-09-01 (×3): 0.1 mg via ORAL
  Filled 2013-08-31 (×4): qty 1

## 2013-08-31 MED ORDER — LISINOPRIL 5 MG PO TABS: 5.0000 mg | ORAL_TABLET | Freq: Every day | ORAL | Status: DC

## 2013-08-31 MED ORDER — HYDROCHLOROTHIAZIDE 12.5 MG PO TABS: 12.5000 mg | ORAL_TABLET | Freq: Every day | ORAL | Status: DC

## 2013-08-31 MED ORDER — CLONIDINE HCL 0.1 MG PO TABS: 0.1000 mg | ORAL_TABLET | Freq: Two times a day (BID) | ORAL | Status: DC

## 2013-08-31 MED ORDER — HYDROCHLOROTHIAZIDE 25 MG PO TABS
25.0000 mg | ORAL_TABLET | Freq: Every day | ORAL | Status: DC
  Administered 2013-08-31 – 2013-09-01 (×2): 25 mg via ORAL
  Filled 2013-08-31 (×2): qty 1

## 2013-08-31 MED ORDER — LISINOPRIL-HYDROCHLOROTHIAZIDE 20-25 MG PO TABS: 1.0000 | ORAL_TABLET | Freq: Every day | ORAL | Status: DC

## 2013-08-31 NOTE — Discharge Summary (Addendum)
Physician Discharge Summary  [name] ZOX:[id number] DOB: 06-09-1960 DOA: 08/30/2013  PCP: Shawn GermanAVBUERE,EDWIN A, MD  Admit date: 08/30/2013 Discharge date: 08/31/2013  Time spent: *35 minutes  Recommendations for Outpatient Follow-up:  1. Follow up with Dr. Claudie Small  Discharge Diagnoses:  Principal Problem:   Hypertensive emergency Active Problems:   Chest pain   History of noncompliance with medical treatment   Hypertension   GERD (gastroesophageal reflux disease)   Gout   Hyperlipidemia   Hypertensive crisis   Discharge Condition: stable  Diet recommendation: heart healthy  Filed Weights   08/30/13 1402 08/31/13 0600  Weight: 97.251 kg (214 lb 6.4 oz) 96.4 kg (212 lb 8.4 oz)    History of present illness:  53 y.o. male, with history of hypertension, dyslipidemia, gout, chronic diastolic heart failure, mild aortic stenosis noted on echogram 2013, who is noncompliant with his medications and ran out of his medications several months ago, he is noted his blood pressure running high, this morning at work he experienced Small 5-10 minute episode of left-sided chest pressure radiating to his left arm, made him little short of breath and sweaty, he then came to the ER where his blood pressure was noted to be 210/110, he was diagnosed with hypertensive crisis and hospitalist team was requested to admit the patient.  In the ER patient has been started on nitro drip with improvement in blood pressure, he is completely symptom free now, he says that he ran out of his medications due to insurance issues however he has history of noncompliance in the past as well, denies any headache, no fever chills, no cough shortness of breath, no palpitations, he has good exertional capacity and has noticed no chest discomfort with it in the last several months, denies any abdominal pain, no blood in stool or urine or dysuria. No focal weakness .      Hospital Course:  Hypertensive emergency with chest pain.   - Chest pain resolved with improvement in blood pressure, started on NTG in ed. - restart all of his home meds. Titrated his NTG off. - counseled about compliance. - follow up with PCP.   Chest pain likely due to #1 above.  - Completely chest pain-free now, EKG is nonacute,  - cradiac markers negative x 3.  Chronic diastolic CHF with mild aortic stenosis.   - follow up with PCP.  History of dyslipidemia.  - Outpatient followup no acute issues. Not on medications.    Procedures:  CXR ECHO 5.30.2015: ejection fraction was in the range of 65% to 70%. Doppler parameters are consistent with abnormal left ventricular relaxation (grade 1 diastolic dysfunction   Consultations:  none  Discharge Exam: Filed Vitals:   08/31/13 0600  BP: 151/102  Pulse: 63  Temp: 97.9 F (36.6 C)  Resp: 20    General: Small&O x3 Cardiovascular: RRR Respiratory: good air movement CTA B/L  Discharge Instructions You were cared for by Small hospitalist during your hospital stay. If you have any questions about your discharge medications or the care you received while you were in the hospital after you are discharged, you can call the unit and asked to speak with the hospitalist on call if the hospitalist that took care of you is not available. Once you are discharged, your primary care physician will handle any further medical issues. Please note that NO REFILLS for any discharge medications will be authorized once you are discharged, as it is imperative that you return to your primary care physician (or  establish Small relationship with Small primary care physician if you do not have one) for your aftercare needs so that they can reassess your need for medications and monitor your lab values.      Discharge Instructions   Diet - low sodium heart healthy    Complete by:  As directed      Increase activity slowly    Complete by:  As directed             Medication List    STOP taking these medications        aspirin 325 MG tablet     lisinopril-hydrochlorothiazide 20-25 MG per tablet  Commonly known as:  PRINZIDE,ZESTORETIC      TAKE these medications       allopurinol 300 MG tablet  Commonly known as:  ZYLOPRIM  Take 300 mg by mouth daily.     amLODipine 10 MG tablet  Commonly known as:  NORVASC  Take 1 tablet (10 mg total) by mouth daily.     cloNIDine 0.1 MG tablet  Commonly known as:  CATAPRES  Take 1 tablet (0.1 mg total) by mouth 2 (two) times daily.     cyclobenzaprine 10 MG tablet  Commonly known as:  FLEXERIL  Take 10 mg by mouth daily as needed. Muscle relaxer     hydrochlorothiazide 12.5 MG tablet  Commonly known as:  HYDRODIURIL  Take 1 tablet (12.5 mg total) by mouth daily.     lisinopril 5 MG tablet  Commonly known as:  PRINIVIL,ZESTRIL  Take 4 tablets (20 mg total) by mouth daily.     omeprazole 20 MG capsule  Commonly known as:  PRILOSEC  Take 20 mg by mouth 2 (two) times daily.     potassium chloride SA 20 MEQ tablet  Commonly known as:  K-DUR,KLOR-CON  Take 1 tablet (20 mEq total) by mouth 2 (two) times daily. One po bid x 3 days, then one po once Small day       Allergies  Allergen Reactions  . Adhesive [Tape] Other (See Comments)    Makes the skin feel as if it is burning, will also bruise the skin.  . Latex Hives and Itching    Burns skin      The results of significant diagnostics from this hospitalization (including imaging, microbiology, ancillary and laboratory) are listed below for reference.    Significant Diagnostic Studies: Dg Chest 2 View  08/30/2013   CLINICAL DATA:  [name].  Dry cough.  EXAM: CHEST  2 VIEW  COMPARISON:  Chest radiograph performed 11/27/2011  FINDINGS: The lungs are well-aerated and clear. There is no evidence of focal opacification, pleural effusion or pneumothorax.  The heart is normal in size; the mediastinal contour is within normal limits. No acute osseous abnormalities are seen.   IMPRESSION: No acute cardiopulmonary process seen.   Electronically Signed   By: Roanna Raider M.D.   On: 08/30/2013 05:24    Microbiology: No results found for this or any previous visit (from the past 240 hour(s)).   Labs: Basic Metabolic Panel:  Recent Labs Lab 08/30/13 0415 08/30/13 1110 08/31/13 0345  NA 140  --  138  K 3.5*  --  3.8  CL 101  --  103  CO2 25  --  24  GLUCOSE 107*  --  111*  BUN 10  --  12  CREATININE 0.84 0.94 0.92  CALCIUM 9.6  --  9.2  Liver Function Tests: No results found for this basename: AST, ALT, ALKPHOS, BILITOT, PROT, ALBUMIN,  in the last 168 hours No results found for this basename: LIPASE, AMYLASE,  in the last 168 hours No results found for this basename: AMMONIA,  in the last 168 hours CBC:  Recent Labs Lab 08/30/13 0415 08/30/13 1110 08/31/13 0345  WBC 5.4 5.9 5.3  HGB 14.5 13.9 13.6  HCT 43.5 41.0 40.9  MCV 72.9* 72.2* 73.2*  PLT 150 135* 135*   Cardiac Enzymes:  Recent Labs Lab 08/30/13 0835 08/30/13 1110 08/30/13 1939  TROPONINI <0.30 <0.30 <0.30   BNP: BNP (last 3 results)  Recent Labs  08/30/13 0415  PROBNP 183.7*   CBG: No results found for this basename: GLUCAP,  in the last 168 hours     Signed:  Marinda Elk  [facility] 08/31/2013, 9:49 AM

## 2013-08-31 NOTE — Progress Notes (Signed)
Echocardiogram 2D Echocardiogram has been performed.  [name] 08/31/2013, 12:46 PM

## 2013-09-01 DIAGNOSIS — I359 Nonrheumatic aortic valve disorder, unspecified: Secondary | ICD-10-CM

## 2013-09-01 NOTE — Progress Notes (Signed)
[facility] PROGRESS NOTE Assessment/Plan: Hypertensive emergency with chest pain.  - Chest pain resolved with improvement in blood pressure, started on NTG in ed.  - restart all of his home meds. Titrated his NTG off.  - counseled about compliance.  - follow up with PCP.   Chest pain likely due to #1 above.  - Completely chest pain-free now, EKG is nonacute,  - cradiac markers negative x 3.   Chronic diastolic CHF with mild aortic stenosis.  - follow up with PCP.   History of dyslipidemia.  - Outpatient followup no acute issues. Not on medications.    Code Status: full Family Communication: none  Disposition Plan: inpatient   Consultants:  none  Procedures:  ECHO: pending  Antibiotics:  none  HPI/Subjective: No complains  Objective: Filed Vitals:   08/31/13 1420 08/31/13 2028 09/01/13 0723 09/01/13 0900  BP: 142/95 147/93 147/95 176/102  Pulse: 66 74 77   Temp: 98.3 F (36.8 C) 98.7 F (37.1 C) 97.7 F (36.5 C)   TempSrc: Oral Oral Oral   Resp: 20 18 18    Height:      Weight:   95.1 kg (209 lb 10.5 oz)   SpO2: 96% 98% 97%     Intake/Output Summary (Last 24 hours) at 09/01/13 0958 Last data filed at 09/01/13 0929  Gross per 24 hour  Intake   1080 ml  Output   2000 ml  Net   -920 ml   Filed Weights   08/30/13 1402 08/31/13 0600 09/01/13 0723  Weight: 97.251 kg (214 lb 6.4 oz) 96.4 kg (212 lb 8.4 oz) 95.1 kg (209 lb 10.5 oz)    Exam:  General: Alert, awake, oriented x3, in no acute distress.  HEENT: No bruits, no goiter.  Heart: Regular rate and rhythm, without murmurs, rubs, gallops.  Lungs: Good air movement, clear Abdomen: Soft, nontender, nondistended, positive bowel sounds.     Data Reviewed: Basic Metabolic Panel:  Recent Labs Lab 08/30/13 0415 08/30/13 1110 08/31/13 0345  NA 140  --  138  K 3.5*  --  3.8  CL 101  --  103  CO2 25  --  24  GLUCOSE 107*  --  111*  BUN 10  --  12  CREATININE 0.84 0.94 0.92    CALCIUM 9.6  --  9.2   Liver Function Tests: No results found for this basename: AST, ALT, ALKPHOS, BILITOT, PROT, ALBUMIN,  in the last 168 hours No results found for this basename: LIPASE, AMYLASE,  in the last 168 hours No results found for this basename: AMMONIA,  in the last 168 hours CBC:  Recent Labs Lab 08/30/13 0415 08/30/13 1110 08/31/13 0345  WBC 5.4 5.9 5.3  HGB 14.5 13.9 13.6  HCT 43.5 41.0 40.9  MCV 72.9* 72.2* 73.2*  PLT 150 135* 135*   Cardiac Enzymes:  Recent Labs Lab 08/30/13 0835 08/30/13 1110 08/30/13 1939  TROPONINI <0.30 <0.30 <0.30   BNP (last 3 results)  Recent Labs  08/30/13 0415  PROBNP 183.7*   CBG: No results found for this basename: GLUCAP,  in the last 168 hours  No results found for this or any previous visit (from the past 240 hour(s)).   Studies: No results found.  Scheduled Meds: . allopurinol  300 mg Oral Daily  . amLODipine  10 mg Oral Daily  . aspirin  81 mg Oral Daily  . cloNIDine  0.1 mg Oral BID  . heparin  5,000 Units Subcutaneous 3  times per day  . hydrALAZINE  50 mg Oral 3 times per day  . lisinopril  20 mg Oral Daily   And  . hydrochlorothiazide  25 mg Oral Daily  . pantoprazole  40 mg Oral Daily   Continuous Infusions:    Marinda Elk  [facility] Pager [phone]. If 8PM-8AM, please contact night-coverage at [email or web address], password [facility] 09/01/2013, 9:58 AM  LOS: 2 days      **Disclaimer: This note may have been dictated with voice recognition software. Similar sounding words can inadvertently be transcribed and this note may contain transcription errors which may not have been corrected upon publication of note.**

## 2013-09-01 NOTE — Discharge Instructions (Signed)
[name] was admitted to the Hospital on 08/30/2013 and Discharged on Discharge Date 09/01/2013 and should be excused from work/school   for 3   days starting 08/30/2013 , may return to work/school without any restrictions.  Call Lambert Keto MD, [facility] [phone] with questions.  Marinda Elk M.D on 09/01/2013,at 10:02 AM  [facility] Office  [phone]

## 2013-09-02 NOTE — Progress Notes (Signed)
Clinical social worker assisted with patient discharge to skilled nursing facility, [facility].  CSW addressed all family questions and concerns. CSW copied chart and added all important documents. CSW also set up patient transportation with [facility]. Clinical Social Worker will sign off for now as social work intervention is no longer needed.  Michell Kader, MSW, LCSWA [phone] 

## 2013-09-24 ENCOUNTER — Ambulatory Visit: Payer: Self-pay

## 2014-02-06 ENCOUNTER — Emergency Department (HOSPITAL_COMMUNITY)
Admission: EM | Admit: 2014-02-06 | Discharge: 2014-02-06 | Disposition: A | Discharge status: Home / Self Care | Payer: Self-pay | Attending: Emergency Medicine | Admitting: Emergency Medicine

## 2014-02-06 ENCOUNTER — Encounter (HOSPITAL_COMMUNITY): Payer: Self-pay | Admitting: *Deleted

## 2014-02-06 ENCOUNTER — Emergency Department (HOSPITAL_COMMUNITY): Payer: Self-pay

## 2014-02-06 DIAGNOSIS — K219 Gastro-esophageal reflux disease without esophagitis: Secondary | ICD-10-CM | POA: Insufficient documentation

## 2014-02-06 DIAGNOSIS — R011 Cardiac murmur, unspecified: Secondary | ICD-10-CM | POA: Insufficient documentation

## 2014-02-06 DIAGNOSIS — R61 Generalized hyperhidrosis: Secondary | ICD-10-CM | POA: Insufficient documentation

## 2014-02-06 DIAGNOSIS — I10 Essential (primary) hypertension: Secondary | ICD-10-CM | POA: Insufficient documentation

## 2014-02-06 DIAGNOSIS — G4730 Sleep apnea, unspecified: Secondary | ICD-10-CM | POA: Insufficient documentation

## 2014-02-06 DIAGNOSIS — Z91040 Latex allergy status: Secondary | ICD-10-CM | POA: Insufficient documentation

## 2014-02-06 DIAGNOSIS — Z79899 Other long term (current) drug therapy: Secondary | ICD-10-CM | POA: Insufficient documentation

## 2014-02-06 DIAGNOSIS — Z8639 Personal history of other endocrine, nutritional and metabolic disease: Secondary | ICD-10-CM | POA: Insufficient documentation

## 2014-02-06 DIAGNOSIS — M109 Gout, unspecified: Secondary | ICD-10-CM | POA: Insufficient documentation

## 2014-02-06 LAB — BASIC METABOLIC PANEL
Anion gap: 14 (ref 5–15)
BUN: 13 mg/dL (ref 6–23)
CHLORIDE: 102 meq/L (ref 96–112)
CO2: 24 meq/L (ref 19–32)
CREATININE: 0.87 mg/dL (ref 0.50–1.35)
Calcium: 9.7 mg/dL (ref 8.4–10.5)
GFR calc Af Amer: >90 mL/min (ref >90)
GFR calc non Af Amer: >90 mL/min (ref >90)
Glucose, Bld: 96 mg/dL (ref 70–99)
POTASSIUM: 3.8 meq/L (ref 3.7–5.3)
Sodium: 140 mEq/L (ref 137–147)

## 2014-02-06 LAB — CBC WITH DIFFERENTIAL/PLATELET
Basophils Absolute: 0.1 10*3/uL (ref 0.0–0.1)
Basophils Relative: 1 % (ref 0–1)
EOS ABS: 0.1 10*3/uL (ref 0.0–0.7)
Eosinophils Relative: 3 % (ref 0–5)
HEMATOCRIT: 44.6 % (ref 39.0–52.0)
Hemoglobin: 15 g/dL (ref 13.0–17.0)
LYMPHS ABS: 2.2 10*3/uL (ref 0.7–4.0)
Lymphocytes Relative: 44 % (ref 12–46)
MCH: 24.4 pg — ABNORMAL LOW (ref 26.0–34.0)
MCHC: 33.6 g/dL (ref 30.0–36.0)
MCV: 72.5 fL — AB (ref 78.0–100.0)
MONO ABS: 0.3 10*3/uL (ref 0.1–1.0)
Monocytes Relative: 7 % (ref 3–12)
NEUTROS ABS: 2.2 10*3/uL (ref 1.7–7.7)
NEUTROS PCT: 45 % (ref 43–77)
Platelets: 152 10*3/uL (ref 150–400)
RBC: 6.15 MIL/uL — AB (ref 4.22–5.81)
RDW: 13.8 % (ref 11.5–15.5)
WBC: 4.9 10*3/uL (ref 4.0–10.5)

## 2014-02-06 LAB — I-STAT TROPONIN, ED: Troponin i, poc: 0 ng/mL (ref 0.00–0.08)

## 2014-02-06 MED ORDER — HYDRALAZINE HCL 20 MG/ML IJ SOLN
5.0000 mg | INTRAMUSCULAR | Status: AC
  Administered 2014-02-06: 5 mg via INTRAVENOUS

## 2014-02-06 MED ORDER — HYDROCHLOROTHIAZIDE 25 MG PO TABS: 25.0000 mg | ORAL_TABLET | Freq: Every day | ORAL | Status: DC

## 2014-02-06 MED ORDER — HYDRALAZINE HCL 20 MG/ML IJ SOLN
10.0000 mg | INTRAMUSCULAR | Status: AC
  Administered 2014-02-06: 10 mg via INTRAVENOUS
  Filled 2014-02-06: qty 1

## 2014-02-06 MED ORDER — HYDROCHLOROTHIAZIDE 25 MG PO TABS
25.0000 mg | ORAL_TABLET | Freq: Every day | ORAL | Status: DC
  Administered 2014-02-06: 25 mg via ORAL
  Filled 2014-02-06: qty 1

## 2014-02-06 NOTE — Discharge Instructions (Signed)
DASH Eating Plan °DASH stands for "Dietary Approaches to Stop Hypertension." The DASH eating plan is a healthy eating plan that has been shown to reduce high blood pressure (hypertension). Additional health benefits may include reducing the risk of type 2 diabetes mellitus, heart disease, and stroke. The DASH eating plan may also help with weight loss. °WHAT DO I NEED TO KNOW ABOUT THE DASH EATING PLAN? °For the DASH eating plan, you will follow these general guidelines: °· Choose foods with a percent daily value for sodium of less than 5% (as listed on the food label). °· Use salt-free seasonings or herbs instead of table salt or sea salt. °· Check with your health care provider or pharmacist before using salt substitutes. °· Eat lower-sodium products, often labeled as "lower sodium" or "no salt added." °· Eat fresh foods. °· Eat more vegetables, fruits, and low-fat dairy products. °· Choose whole grains. Look for the word "whole" as the first word in the ingredient list. °· Choose fish and skinless chicken or turkey more often than red meat. Limit fish, poultry, and meat to 6 oz (170 g) each day. °· Limit sweets, desserts, sugars, and sugary drinks. °· Choose heart-healthy fats. °· Limit cheese to 1 oz (28 g) per day. °· Eat more home-cooked food and less restaurant, buffet, and fast food. °· Limit fried foods. °· Cook foods using methods other than frying. °· Limit canned vegetables. If you do use them, rinse them well to decrease the sodium. °· When eating at a restaurant, ask that your food be prepared with less salt, or no salt if possible. °WHAT FOODS CAN I EAT? °Seek help from a dietitian for individual calorie needs. °Grains °Whole grain or whole wheat bread. Brown rice. Whole grain or whole wheat pasta. Quinoa, bulgur, and whole grain cereals. Low-sodium cereals. Corn or whole wheat flour tortillas. Whole grain cornbread. Whole grain crackers. Low-sodium crackers. °Vegetables °Fresh or frozen vegetables  (raw, steamed, roasted, or grilled). Low-sodium or reduced-sodium tomato and vegetable juices. Low-sodium or reduced-sodium tomato sauce and paste. Low-sodium or reduced-sodium canned vegetables.  °Fruits °All fresh, canned (in natural juice), or frozen fruits. °Meat and Other Protein Products °Ground beef (85% or leaner), grass-fed beef, or beef trimmed of fat. Skinless chicken or turkey. Ground chicken or turkey. Pork trimmed of fat. All fish and seafood. Eggs. Dried beans, peas, or lentils. Unsalted nuts and seeds. Unsalted canned beans. °Dairy °Low-fat dairy products, such as skim or 1% milk, 2% or reduced-fat cheeses, low-fat ricotta or cottage cheese, or plain low-fat yogurt. Low-sodium or reduced-sodium cheeses. °Fats and Oils °Tub margarines without trans fats. Light or reduced-fat mayonnaise and salad dressings (reduced sodium). Avocado. Safflower, olive, or canola oils. Natural peanut or almond butter. °Other °Unsalted popcorn and pretzels. °The items listed above may not be a complete list of recommended foods or beverages. Contact your dietitian for more options. °WHAT FOODS ARE NOT RECOMMENDED? °Grains °White bread. White pasta. White rice. Refined cornbread. Bagels and croissants. Crackers that contain trans fat. °Vegetables °Creamed or fried vegetables. Vegetables in a cheese sauce. Regular canned vegetables. Regular canned tomato sauce and paste. Regular tomato and vegetable juices. °Fruits °Dried fruits. Canned fruit in light or heavy syrup. Fruit juice. °Meat and Other Protein Products °Fatty cuts of meat. Ribs, chicken wings, bacon, sausage, bologna, salami, chitterlings, fatback, hot dogs, bratwurst, and packaged luncheon meats. Salted nuts and seeds. Canned beans with salt. °Dairy °Whole or 2% milk, cream, half-and-half, and cream cheese. Whole-fat or sweetened yogurt. Full-fat   cheeses or blue cheese. Nondairy creamers and whipped toppings. Processed cheese, cheese spreads, or cheese  curds. Condiments Onion and garlic salt, seasoned salt, table salt, and sea salt. Canned and packaged gravies. Worcestershire sauce. Tartar sauce. Barbecue sauce. Teriyaki sauce. Soy sauce, including reduced sodium. Steak sauce. Fish sauce. Oyster sauce. Cocktail sauce. Horseradish. Ketchup and mustard. Meat flavorings and tenderizers. Bouillon cubes. Hot sauce. Tabasco sauce. Marinades. Taco seasonings. Relishes. Fats and Oils Butter, stick margarine, lard, shortening, ghee, and bacon fat. Coconut, palm kernel, or palm oils. Regular salad dressings. Other Pickles and olives. Salted popcorn and pretzels. The items listed above may not be a complete list of foods and beverages to avoid. Contact your dietitian for more information. WHERE CAN I FIND MORE INFORMATION? National Heart, Lung, and Blood Institute: CablePromo.itwww.nhlbi.nih.gov/health/health-topics/topics/dash/ Document Released: 03/10/2011 Document Revised: 08/05/2013 Document Reviewed: 01/23/2013 [facility] Patient Information 2015 [employer] [employer], [place]. This information is not intended to replace advice given to you by your health care provider. Make sure you discuss any questions you have with your health care provider.  You were evaluated today for your high blood pressure in the emergency room. There are no acute or emergent causes for your high blood pressure. You must take your blood pressure medicines as directed. Her blood pressure was reduced during her stay in the ED. Please follow-up with your primary care in order to establish care for further evaluation and management of your blood pressure. If you begin to experience fevers, worsening headaches, changes in vision, chest pain, shortness of breath please return for further evaluation and management

## 2014-02-06 NOTE — ED Provider Notes (Signed)
CSN: [id number]     Arrival date & time 02/06/14  [id number] History   None    Chief Complaint  Patient presents with  . Hypertension     (Consider location/radiation/quality/duration/timing/severity/associated sxs/prior Treatment) HPI [name] is a 53 y.o. male with a history of uncontrolled hypertension, hypercholesterolemia, medication noncompliance comes in for evaluation of high blood pressure and headache. Patient states he has been unable to take any of his medications since January of this year. He reports he is finally able to get insurance through his work and will be able to sign up for benefits on November 9. He reports episodes of night sweats for the past 2 nights, overall weakness and tiredness as well as a headache that started today. Denies fevers, chest pain, shortness of breath, focal numbness or weakness.  Past Medical History  Diagnosis Date  . High cholesterol   . Heart murmur     "leaky valve"  . Angina   . Exertional dyspnea   . Sleep apnea 08/2010    "not required to wear mask"  . Gout     "sometimes flares up even w/Allupurinol"  . Hypertension   . GERD (gastroesophageal reflux disease)    Past Surgical History  Procedure Laterality Date  . Upper gastrointestinal endoscopy  03/2011  . Colonoscopy  03/2011  . Knee arthroscopy  2004    "right; w/ligament repair in kneecap"  . Multiple tooth extractions  06/2010    full mouth  . Tonsillectomy      "I was a kid"   Family History  Problem Relation Age of Onset  . Hypertension     History  Substance Use Topics  . Smoking status: Never Smoker   . Smokeless tobacco: Never Used  . Alcohol Use: No    Review of Systems  Constitutional: Negative for fever.  HENT: Negative for sore throat.   Eyes: Negative for visual disturbance.  Respiratory: Negative for shortness of breath.   Cardiovascular: Negative for chest pain.  Gastrointestinal: Negative for abdominal pain.  Endocrine: Negative for polyuria.   Genitourinary: Negative for dysuria.  Skin: Negative for rash.  Neurological: Positive for weakness and headaches.      Allergies  Adhesive and Latex  Home Medications   Prior to Admission medications   Medication Sig Start Date End Date Taking? Authorizing Provider  allopurinol (ZYLOPRIM) 300 MG tablet Take 300 mg by mouth daily.    Historical Provider, MD  amLODipine (NORVASC) 10 MG tablet Take 1 tablet (10 mg total) by mouth daily. 08/31/13   Marinda ElkAbraham Feliz Ortiz, MD  cloNIDine (CATAPRES) 0.1 MG tablet Take 1 tablet (0.1 mg total) by mouth 2 (two) times daily. 08/31/13   Marinda ElkAbraham Feliz Ortiz, MD  cyclobenzaprine (FLEXERIL) 10 MG tablet Take 10 mg by mouth daily as needed. Muscle relaxer    Historical Provider, MD  hydrochlorothiazide (HYDRODIURIL) 25 MG tablet Take 1 tablet (25 mg total) by mouth daily. 02/06/14   Earle GellBenjamin W Rickeya Manus, PA-C  lisinopril (PRINIVIL,ZESTRIL) 5 MG tablet Take 4 tablets (20 mg total) by mouth daily. 08/31/13   Marinda ElkAbraham Feliz Ortiz, MD  omeprazole (PRILOSEC) 20 MG capsule Take 20 mg by mouth 2 (two) times daily.    Historical Provider, MD  potassium chloride SA (K-DUR,KLOR-CON) 20 MEQ tablet Take 1 tablet (20 mEq total) by mouth 2 (two) times daily. One po bid x 3 days, then one po once a day Patient not taking: Reported on 02/06/2014 11/27/11 11/26/12  Suzi RootsKevin E Steinl, MD  potassium chloride SA (K-DUR,KLOR-CON) 20 MEQ tablet Take 20 mEq by mouth daily.    Historical Provider, MD   BP 157/99 mmHg  Pulse 108  Temp(Src) 98.5 F (36.9 C) (Oral)  Resp 25  Ht 5\' 9"  (1.753 m)  Wt 215 lb (97.523 kg)  BMI 31.74 kg/m2  SpO2 98% Physical Exam  Constitutional: He is oriented to person, place, and time. He appears well-developed and well-nourished.  HENT:  Head: Normocephalic and atraumatic.  Mouth/Throat: Oropharynx is clear and moist.  Eyes: Conjunctivae are normal. Pupils are equal, round, and reactive to light. Right eye exhibits no discharge. Left eye exhibits no  discharge. No scleral icterus.  Neck: Neck supple.  Cardiovascular: Normal rate and regular rhythm.   Murmur heard. II/VI sys murmur.  Pulmonary/Chest: Effort normal and breath sounds normal. No respiratory distress. He has no wheezes. He has no rales.  Abdominal: Soft. There is no tenderness.  Musculoskeletal: He exhibits no tenderness.  Neurological: He is alert and oriented to person, place, and time.  Cranial Nerves II-XII grossly intact. No focal neurodeficits. Patient is able to ambulate all 4 extremities and apparently without any difficulty or ataxia.  Skin: Skin is warm and dry. No rash noted.  Psychiatric: He has a normal mood and affect.  Nursing note and vitals reviewed.   ED Course  Procedures (including critical care time) Labs Review Labs Reviewed  CBC WITH DIFFERENTIAL - Abnormal; Notable for the following:    RBC 6.15 (*)    MCV 72.5 (*)    MCH 24.4 (*)    All other components within normal limits  BASIC METABOLIC PANEL  I-STAT TROPOININ, ED    Imaging Review Dg Chest 2 View  02/06/2014   CLINICAL DATA:  Headache, generalized fatigue, history of hypertension  EXAM: CHEST  2 VIEW  COMPARISON:  Chest x-Hussar of 08/30/2013  FINDINGS: No active infiltrate or effusion is seen. Mediastinal and hilar contours are unremarkable. The heart is within normal limits in size. No bony abnormality is seen.  IMPRESSION: No active cardiopulmonary disease.   Electronically Signed   By: Dwyane DeePaul  Barry M.D.   On: 02/06/2014 11:45     EKG Interpretation None      MDM  Vitals stable -afebrile. BP reduced in ED--MAP decreased by 25% Pt resting comfortably in ED. Reports his HA has resolved and he feels much better and is ready to go home. PE not concerning for other acute or emergent pathology. No evidence of SAH or other ICH, CVA Labwork noncontributory Imaging--CXR shows no acute cardiopulmonary pathology.  Will DC with HCTZ 25mg  and encouraged fu with PCP for further management of  HTN. Discussed f/u with PCP and return precautions, pt very amenable to plan. Requests note for work.  Pt stable, in good condition and is appropriate for discharge. Prior to patient discharge, I discussed and reviewed this case with Dr.Docherty   Final diagnoses:  Night sweats  Poor high blood pressure control        Earle GellBenjamin W Wahak Hotrontkartner, PA-C 02/07/14 [id number]  Toy CookeyMegan Docherty, MD 02/07/14 1440

## 2014-02-06 NOTE — ED Notes (Signed)
Pt in stating he thinks his BP is elevated, has been unable to get his BP medication for a long time, reports headache over the last few days and generalized fatigue, no distress noted

## 2014-02-06 NOTE — ED Notes (Signed)
Pt undressed, on monitor, continuous pulse oximetry and blood pressure cuff 

## 2014-03-18 ENCOUNTER — Emergency Department (HOSPITAL_COMMUNITY): Payer: Self-pay

## 2014-03-18 ENCOUNTER — Encounter (HOSPITAL_COMMUNITY): Payer: Self-pay | Admitting: *Deleted

## 2014-03-18 ENCOUNTER — Emergency Department (HOSPITAL_COMMUNITY)
Admission: EM | Admit: 2014-03-18 | Discharge: 2014-03-19 | Disposition: A | Discharge status: Home / Self Care | Payer: Self-pay | Attending: Emergency Medicine | Admitting: Emergency Medicine

## 2014-03-18 DIAGNOSIS — Z8639 Personal history of other endocrine, nutritional and metabolic disease: Secondary | ICD-10-CM | POA: Insufficient documentation

## 2014-03-18 DIAGNOSIS — M109 Gout, unspecified: Secondary | ICD-10-CM | POA: Insufficient documentation

## 2014-03-18 DIAGNOSIS — Z8669 Personal history of other diseases of the nervous system and sense organs: Secondary | ICD-10-CM | POA: Insufficient documentation

## 2014-03-18 DIAGNOSIS — I10 Essential (primary) hypertension: Secondary | ICD-10-CM | POA: Insufficient documentation

## 2014-03-18 DIAGNOSIS — Z91040 Latex allergy status: Secondary | ICD-10-CM | POA: Insufficient documentation

## 2014-03-18 DIAGNOSIS — M25462 Effusion, left knee: Secondary | ICD-10-CM | POA: Insufficient documentation

## 2014-03-18 DIAGNOSIS — R011 Cardiac murmur, unspecified: Secondary | ICD-10-CM | POA: Insufficient documentation

## 2014-03-18 DIAGNOSIS — R609 Edema, unspecified: Secondary | ICD-10-CM

## 2014-03-18 DIAGNOSIS — K219 Gastro-esophageal reflux disease without esophagitis: Secondary | ICD-10-CM | POA: Insufficient documentation

## 2014-03-18 DIAGNOSIS — I209 Angina pectoris, unspecified: Secondary | ICD-10-CM | POA: Insufficient documentation

## 2014-03-18 MED ORDER — LIDOCAINE-EPINEPHRINE (PF) 2 %-1:200000 IJ SOLN
10.0000 mL | Freq: Once | INTRAMUSCULAR | Status: AC
  Administered 2014-03-19: 10 mL via INTRADERMAL
  Filled 2014-03-18: qty 20

## 2014-03-18 MED ORDER — MORPHINE SULFATE 4 MG/ML IJ SOLN
4.0000 mg | Freq: Once | INTRAMUSCULAR | Status: AC
  Administered 2014-03-19: 4 mg via INTRAVENOUS
  Filled 2014-03-18: qty 1

## 2014-03-18 NOTE — ED Provider Notes (Signed)
TIME SEEN: 11:40 PM  CHIEF COMPLAINT: Left knee swelling, hypertension  HPI: Pt is a 53 y.o. M with history of hypertension, hyperlipidemia, gout to presents to the emergency department with complaints of left knee swelling that he noticed yesterday. He states that he works at a job or he has to stand for long hours but denies any known injury. Reports that he noticed swelling in his knee and pain worse with movement. States the swelling has improved but has noticed that the knee appears warmer than normal. No erythema. He states he did have a subjective fever yesterday but no fever or chills today. He has had gout in his toes but never in his knee. No prior history of problems with his knee. No calf pain or swelling. No numbness or focal weakness.  Patient's significant other at bedside is also concerned about his blood pressure being elevated. Denies headache, vision changes, chest pain, numbness, tingling or focal weakness. He does not have a primary care physician and only recently has acquired benefits through work. States he ran out of his refills for Norvasc, clonidine, hydrochlorothiazide and lisinopril.  ROS: See HPI Constitutional: no fever  Eyes: no drainage  ENT: no runny nose   Cardiovascular:  no chest pain  Resp: no SOB  GI: no vomiting GU: no dysuria Integumentary: no rash  Allergy: no hives  Musculoskeletal: Left knee swelling Neurological: no slurred speech ROS otherwise negative  PAST MEDICAL HISTORY/PAST SURGICAL HISTORY:  Past Medical History  Diagnosis Date  . High cholesterol   . Heart murmur     "leaky valve"  . Angina   . Exertional dyspnea   . Sleep apnea 08/2010    "not required to wear mask"  . Gout     "sometimes flares up even w/Allupurinol"  . Hypertension   . GERD (gastroesophageal reflux disease)     MEDICATIONS:  Prior to Admission medications   Medication Sig Start Date End Date Taking? Authorizing Provider  allopurinol (ZYLOPRIM) 300 MG  tablet Take 300 mg by mouth daily.    Historical Provider, MD  amLODipine (NORVASC) 10 MG tablet Take 1 tablet (10 mg total) by mouth daily. 08/31/13   Charlynne Cousins, MD  cloNIDine (CATAPRES) 0.1 MG tablet Take 1 tablet (0.1 mg total) by mouth 2 (two) times daily. 08/31/13   Charlynne Cousins, MD  cyclobenzaprine (FLEXERIL) 10 MG tablet Take 10 mg by mouth daily as needed. Muscle relaxer    Historical Provider, MD  hydrochlorothiazide (HYDRODIURIL) 25 MG tablet Take 1 tablet (25 mg total) by mouth daily. 02/06/14   Viona Gilmore Cartner, PA-C  lisinopril (PRINIVIL,ZESTRIL) 5 MG tablet Take 4 tablets (20 mg total) by mouth daily. 08/31/13   Charlynne Cousins, MD  omeprazole (PRILOSEC) 20 MG capsule Take 20 mg by mouth 2 (two) times daily.    Historical Provider, MD  potassium chloride SA (K-DUR,KLOR-CON) 20 MEQ tablet Take 1 tablet (20 mEq total) by mouth 2 (two) times daily. One po bid x 3 days, then one po once a day Patient not taking: Reported on 02/06/2014 11/27/11 11/26/12  Mirna Mires, MD  potassium chloride SA (K-DUR,KLOR-CON) 20 MEQ tablet Take 20 mEq by mouth daily.    Historical Provider, MD    ALLERGIES:  Allergies  Allergen Reactions  . Adhesive [Tape] Other (See Comments)    Makes the skin feel as if it is burning, will also bruise the skin.  . Latex Hives and Itching    Burns skin  SOCIAL HISTORY:  History  Substance Use Topics  . Smoking status: Never Smoker   . Smokeless tobacco: Never Used  . Alcohol Use: No    FAMILY HISTORY: Family History  Problem Relation Age of Onset  . Hypertension      EXAM: BP 216/108 mmHg  Pulse 87  Temp(Src) 98 F (36.7 C) (Oral)  Resp 20  Ht _0  (1.753 m)  Wt 214 lb (97.07 kg)  BMI 31.59 kg/m2  SpO2 100% CONSTITUTIONAL: Alert and oriented and responds appropriately to questions. Well-appearing; well-nourished HEAD: Normocephalic EYES: Conjunctivae clear, PERRL ENT: normal nose; no rhinorrhea; moist mucous membranes;  pharynx without lesions noted NECK: Supple, no meningismus, no LAD  CARD: RRR; S1 and S2 appreciated; no murmurs, no clicks, no rubs, no gallops RESP: Normal chest excursion without splinting or tachypnea; breath sounds clear and equal bilaterally; no wheezes, no rhonchi, no rales ABD/GI: Normal bowel sounds; non-distended; soft, non-tender, no rebound, no guarding BACK:  The back appears normal and is non-tender to palpation, there is no CVA tenderness EXT: Patient has a large left knee joint effusion with warmth no erythema, no induration, patient is unable to move the joints because of pain, no calf tenderness or swelling, 2+ DP pulses bilaterally, unable to test ligamentous laxity, no obvious bony injury, no pain at the hip or ankle on the left side, otherwise Normal ROM in all joints; otherwise extremities are non-tender to palpation; no edema; normal capillary refill; no cyanosis    SKIN: Normal color for age and race; warm NEURO: Moves all extremities equally; patient sensation to light touch intact diffusely, cranial nerves II through XII intact PSYCH: The patient's mood and manner are appropriate. Grooming and personal hygiene are appropriate.  MEDICAL DECISION MAKING: Patient here with a left knee effusion with no history of injury. Differential is gout versus septic arthritis. Has a reported history of subjective fevers and does have warmth on exam and a large effusion with significant pain with minimal movement. We'll obtain joint fluid for arthrocentesis. Will consent patient at bedside. X-Hartig shows mild arthritic changes but no other acute bony abnormality. We'll obtain labs including CBC, CRP and sedimentation rate. If pain medicine. As for his hypertension will reorder his home medications. He has no symptoms of headache, neurologic deficits, chest pain. Will obtain creatinine, urinalysis to look for signs of end organ damage.  ED PROGRESS: Patient's labs unremarkable. No leukocytosis.  ESR pending. Urinalysis pending. Patient tolerated arthrocentesis without complications. Gram stain, cell count and crystals pending. Signed out to Dr. Jeneen Rinks who will follow-up on pending labs, urine. Patient's wife has been given prescriptions for his clonidine, hydrochlorothiazide, lisinopril, amlodipine and allopurinol. Have discussed with him importance of close outpatient follow-up for management for his blood pressure.      ARTHOCENTESIS Performed by: Nyra Jabs Consent: Verbal consent obtained. Risks and benefits: risks, benefits and alternatives were discussed Consent given by: patient Required items: required blood products, implants, devices, and special equipment available Patient identity confirmed: verbally with patient Time out: Immediately prior to procedure a "time out" was called to verify the correct patient, procedure, equipment, support staff and site/side marked as required. Indications: Evaluate for septic arthritis  Joint: Left knee Local anesthesia used: 2% lidocaine with epinephrine, 5 ML's  Preparation: Patient was prepped and draped in the usual sterile fashion. Aspirate appearance: Yellow, cloudy, slightly bloody  Aspirate amount: 95 ml Patient tolerance: Patient tolerated the procedure well with no immediate complications.     Cyril Mourning  N Ward, DO 03/19/14 [id number]

## 2014-03-18 NOTE — ED Notes (Signed)
Pt in c/o left knee swelling that he first noted yesterday, swelling has improved at this time, c/o pain with movement

## 2014-03-19 LAB — BASIC METABOLIC PANEL
Anion gap: 15 (ref 5–15)
BUN: 10 mg/dL (ref 6–23)
CO2: 23 mEq/L (ref 19–32)
CREATININE: 0.85 mg/dL (ref 0.50–1.35)
Calcium: 9.6 mg/dL (ref 8.4–10.5)
Chloride: 101 mEq/L (ref 96–112)
GFR calc Af Amer: >90 mL/min (ref >90)
Glucose, Bld: 110 mg/dL — ABNORMAL HIGH (ref 70–99)
Potassium: 3.7 mEq/L (ref 3.7–5.3)
SODIUM: 139 meq/L (ref 137–147)

## 2014-03-19 LAB — URINALYSIS, ROUTINE W REFLEX MICROSCOPIC
BILIRUBIN URINE: NEGATIVE
GLUCOSE, UA: NEGATIVE mg/dL
HGB URINE DIPSTICK: NEGATIVE
Ketones, ur: NEGATIVE mg/dL
Leukocytes, UA: NEGATIVE
Nitrite: NEGATIVE
PH: 5.5 (ref 5.0–8.0)
Protein, ur: 100 mg/dL — AB
SPECIFIC GRAVITY, URINE: 1.02 (ref 1.005–1.030)
Urobilinogen, UA: 0.2 mg/dL (ref 0.0–1.0)

## 2014-03-19 LAB — GRAM STAIN

## 2014-03-19 LAB — CBC WITH DIFFERENTIAL/PLATELET
Basophils Absolute: 0 10*3/uL (ref 0.0–0.1)
Basophils Relative: 0 % (ref 0–1)
EOS ABS: 0.1 10*3/uL (ref 0.0–0.7)
Eosinophils Relative: 1 % (ref 0–5)
HEMATOCRIT: 42.6 % (ref 39.0–52.0)
Hemoglobin: 14.2 g/dL (ref 13.0–17.0)
Lymphocytes Relative: 34 % (ref 12–46)
Lymphs Abs: 2.4 10*3/uL (ref 0.7–4.0)
MCH: 23.5 pg — ABNORMAL LOW (ref 26.0–34.0)
MCHC: 33.3 g/dL (ref 30.0–36.0)
MCV: 70.6 fL — ABNORMAL LOW (ref 78.0–100.0)
Monocytes Absolute: 0.7 10*3/uL (ref 0.1–1.0)
Monocytes Relative: 10 % (ref 3–12)
NEUTROS ABS: 4 10*3/uL (ref 1.7–7.7)
NEUTROS PCT: 55 % (ref 43–77)
Platelets: 138 10*3/uL — ABNORMAL LOW (ref 150–400)
RBC: 6.03 MIL/uL — AB (ref 4.22–5.81)
RDW: 13.3 % (ref 11.5–15.5)
WBC: 7.2 10*3/uL (ref 4.0–10.5)

## 2014-03-19 LAB — SYNOVIAL CELL COUNT + DIFF, W/ CRYSTALS
CRYSTALS FLUID: NONE SEEN
MONOCYTE-MACROPHAGE-SYNOVIAL FLUID: 30 % — AB (ref 50–90)
NEUTROPHIL, SYNOVIAL: 70 % — AB (ref 0–25)
WBC, Synovial: 9143 /mm3 — ABNORMAL HIGH (ref 0–200)

## 2014-03-19 LAB — URINE MICROSCOPIC-ADD ON

## 2014-03-19 LAB — SEDIMENTATION RATE: Sed Rate: 3 mm/hr (ref 0–16)

## 2014-03-19 LAB — C-REACTIVE PROTEIN: CRP: 1.6 mg/dL — ABNORMAL HIGH (ref <0.60)

## 2014-03-19 MED ORDER — CLONIDINE HCL 0.1 MG PO TABS: 0.1000 mg | ORAL_TABLET | Freq: Two times a day (BID) | ORAL | Status: DC

## 2014-03-19 MED ORDER — HYDROMORPHONE HCL 1 MG/ML IJ SOLN
1.0000 mg | Freq: Once | INTRAMUSCULAR | Status: AC
  Administered 2014-03-19: 1 mg via INTRAVENOUS

## 2014-03-19 MED ORDER — OXYCODONE-ACETAMINOPHEN 5-325 MG PO TABS: 2.0000 | ORAL_TABLET | ORAL | Status: DC | PRN

## 2014-03-19 MED ORDER — HYDROMORPHONE HCL 1 MG/ML IJ SOLN
1.0000 mg | Freq: Once | INTRAMUSCULAR | Status: DC
  Filled 2014-03-19: qty 1

## 2014-03-19 MED ORDER — CLONIDINE HCL 0.1 MG PO TABS
0.1000 mg | ORAL_TABLET | Freq: Once | ORAL | Status: AC
  Administered 2014-03-19: 0.1 mg via ORAL
  Filled 2014-03-19: qty 1

## 2014-03-19 MED ORDER — LISINOPRIL 20 MG PO TABS
20.0000 mg | ORAL_TABLET | Freq: Once | ORAL | Status: AC
  Administered 2014-03-19: 20 mg via ORAL
  Filled 2014-03-19: qty 1

## 2014-03-19 MED ORDER — PREDNISONE 20 MG PO TABS: 20.0000 mg | ORAL_TABLET | Freq: Every day | ORAL | Status: DC

## 2014-03-19 MED ORDER — NAPROXEN 500 MG PO TABS
500.0000 mg | ORAL_TABLET | Freq: Two times a day (BID) | ORAL | Status: DC
Start: 2014-03-19 — End: 2015-02-01

## 2014-03-19 MED ORDER — AMLODIPINE BESYLATE 10 MG PO TABS: 10.0000 mg | ORAL_TABLET | Freq: Every day | ORAL | Status: DC

## 2014-03-19 MED ORDER — HYDROCHLOROTHIAZIDE 25 MG PO TABS
25.0000 mg | ORAL_TABLET | Freq: Every day | ORAL | Status: DC
  Administered 2014-03-19: 25 mg via ORAL
  Filled 2014-03-19: qty 1

## 2014-03-19 MED ORDER — HYDROCHLOROTHIAZIDE 25 MG PO TABS: 25.0000 mg | ORAL_TABLET | Freq: Every day | ORAL | Status: DC

## 2014-03-19 MED ORDER — LISINOPRIL 5 MG PO TABS: 20.0000 mg | ORAL_TABLET | Freq: Every day | ORAL | Status: DC

## 2014-03-19 MED ORDER — AMLODIPINE BESYLATE 5 MG PO TABS
10.0000 mg | ORAL_TABLET | Freq: Once | ORAL | Status: AC
  Administered 2014-03-19: 10 mg via ORAL
  Filled 2014-03-19: qty 2

## 2014-03-19 MED ORDER — ALLOPURINOL 300 MG PO TABS: 300.0000 mg | ORAL_TABLET | Freq: Every day | ORAL | Status: DC

## 2014-03-19 MED ORDER — PREDNISONE 20 MG PO TABS
60.0000 mg | ORAL_TABLET | Freq: Once | ORAL | Status: AC
  Administered 2014-03-19: 60 mg via ORAL
  Filled 2014-03-19: qty 3

## 2014-03-19 NOTE — ED Notes (Signed)
Pt informed about Gram Stain results; Per Lab testing had to be out sourced to off site lab; specimen has been sent just awaiting results

## 2014-03-19 NOTE — ED Notes (Signed)
MD at bedside. 

## 2014-03-19 NOTE — Discharge Instructions (Signed)
You may call [facility] to establish a PCP.   [facility], [facility] are all local physician groups that have primary care physician offices in multiple places and [place] in [place] that she may call to see if there except a new patients that have your insurance. We have also provided a resource guide listed below.    Hypertension Hypertension, commonly called high blood pressure, is when the force of blood pumping through your arteries is too strong. Your arteries are the blood vessels that carry blood from your heart throughout your body. A blood pressure reading consists of a higher number over a lower number, such as 110/72. The higher number (systolic) is the pressure inside your arteries when your heart pumps. The lower number (diastolic) is the pressure inside your arteries when your heart relaxes. Ideally you want your blood pressure below 120/80. Hypertension forces your heart to work harder to pump blood. Your arteries may become narrow or stiff. Having hypertension puts you at risk for heart disease, stroke, and other problems.  RISK FACTORS Some risk factors for high blood pressure are controllable. Others are not.  Risk factors you cannot control include:   Race. You may be at higher risk if you are African American.  Age. Risk increases with age.  Gender. Men are at higher risk than women before age 54 years. After age 54, women are at higher risk than men. Risk factors you can control include:  Not getting enough exercise or physical activity.  Being overweight.  Getting too much fat, sugar, calories, or salt in your diet.  Drinking too much alcohol. SIGNS AND SYMPTOMS Hypertension does not usually cause signs or symptoms. Extremely high blood pressure (hypertensive crisis) may cause headache, anxiety, shortness of breath, and nosebleed. DIAGNOSIS  To check if you have hypertension, your health care provider will measure your blood pressure  while you are seated, with your arm held at the level of your heart. It should be measured at least twice using the same arm. Certain conditions can cause a difference in blood pressure between your right and left arms. A blood pressure reading that is higher than normal on one occasion does not mean that you need treatment. If one blood pressure reading is high, ask your health care provider about having it checked again. TREATMENT  Treating high blood pressure includes making lifestyle changes and possibly taking medicine. Living a healthy lifestyle can help lower high blood pressure. You may need to change some of your habits. Lifestyle changes may include:  Following the DASH diet. This diet is high in fruits, vegetables, and whole grains. It is low in salt, red meat, and added sugars.  Getting at least 2 hours of brisk physical activity every week.  Losing weight if necessary.  Not smoking.  Limiting alcoholic beverages.  Learning ways to reduce stress. If lifestyle changes are not enough to get your blood pressure under control, your health care provider may prescribe medicine. You may need to take more than one. Work closely with your health care provider to understand the risks and benefits. HOME CARE INSTRUCTIONS  Have your blood pressure rechecked as directed by your health care provider.   Take medicines only as directed by your health care provider. Follow the directions carefully. Blood pressure medicines must be taken as prescribed. The medicine does not work as well when you skip doses. Skipping doses also puts you at risk for problems.   Do not smoke.  Monitor your blood pressure at home as directed by your health care provider. SEEK MEDICAL CARE IF:   You think you are having a reaction to medicines taken.  You have recurrent headaches or feel dizzy.  You have swelling in your ankles.  You have trouble with your vision. SEEK IMMEDIATE MEDICAL CARE IF:  You  develop a severe headache or confusion.  You have unusual weakness, numbness, or feel faint.  You have severe chest or abdominal pain.  You vomit repeatedly.  You have trouble breathing. MAKE SURE YOU:   Understand these instructions.  Will watch your condition.  Will get help right away if you are not doing well or get worse. Document Released: 03/21/2005 Document Revised: 08/05/2013 Document Reviewed: 01/11/2013 [facility] Patient Information 2015 [employer] [place]. This information is not intended to replace advice given to you by your health care provider. Make sure you discuss any questions you have with your health care provider.      Emergency Department Resource Guide 1) Find a Doctor and Pay Out of Pocket Although you won't have to find out who is covered by your insurance plan, it is a good idea to ask around and get recommendations. You will then need to call the office and see if the doctor you have chosen will accept you as a new patient and what types of options they offer for patients who are self-pay. Some doctors offer discounts or will set up payment plans for their patients who do not have insurance, but you will need to ask so you aren't surprised when you get to your appointment.  2) Contact Your Local Health Department Not all health departments have doctors that can see patients for sick visits, but many do, so it is worth a call to see if yours does. If you don't know where your local health department is, you can check in your phone book. The CDC also has a tool to help you locate your state's health department, and many state websites also have listings of all of their local health departments.  3) Find a Walk-in Clinic If your illness is not likely to be very severe or complicated, you may want to try a walk in clinic. These are popping up all over the country in pharmacies, drugstores, and shopping centers. They're usually staffed by nurse practitioners or  physician assistants that have been trained to treat common illnesses and complaints. They're usually fairly quick and inexpensive. However, if you have serious medical issues or chronic medical problems, these are probably not your best option.  No Primary Care Doctor: - Call Health Connect at  [phone] - they can help you locate a primary care doctor that  accepts your insurance, provides certain services, etc. - Physician Referral Service- [phone]  Chronic Pain Problems: Organization         Address  Phone   Notes  [facility]  [phone] Patients need to be referred by their primary care doctor.   Medication Assistance: Organization         Address  Phone   Notes  [facility] [address] [place], [place] [id number] [phone] --Must be a resident of [facility] -- Must have NO insurance coverage whatsoever (no Medicaid/ Medicare, etc.) -- The pt. MUST have a primary care doctor that directs their care regularly and follows them in the community   [facility]  [phone]   [address]  [phone]  Agencies that provide inexpensive medical care: Organization         Address  Phone   Notes  [place]  [phone]   [facility]    [phone]   [facility] [place], [place] [phone]   [place] [address] [place] [phone]   Planned Parenthood    [phone]   [facility]    [phone]   [place] and [address], [place] Phone:  [phone], Fax:  [phone] Hours of Operation:  9 am - 6 pm, M-F.  Also accepts Medicaid/Medicare and self-pay.  [facility] for [place] [address], [address], [place] Phone: [phone], Fax: [phone]. Hours of Operation:  8:30 am - 5:30 pm, M-F.   Also accepts Medicaid and self-pay.  [facility] [place] [address], [place] Phone: [phone]   [place], [place], [place] [phone], Ext. [phone] Mondays & Thursdays: 7-9 AM.  First 15 patients are seen on a first come, first serve basis.    [place] Providers:  Organization         Address  Phone   Notes  [facility] [address], [place] [phone] Also accepts self-pay patients.  [facility] [address], [place]  [phone]   [place], [address], [place] [phone]   [facility] [address], [place] [phone]   Lucianne Lei [address], [place]   [phone] Only accepts [place] Access [place] patients after they have their name applied to their card.   Self-Pay (no insurance) in [place] [facility]:  Organization         Address  Phone   Notes  Sickle Cell Patients, [facility] Internal Medicine [place] [phone]   [facility] [place] [phone]   [facility] [place]  [place], [place], [place] [phone]   [facility]/Dr. Osei-Bonsu  [address] [place] or [address], [place] [phone] Phone number for both [place] and [place] locations is the same.  [facility] [address], [place] [phone]   [facility] [place] [address] [place] or [address] [phone]   [facility] [address] [place] [phone], phone; [phone], fax Sees patients 1st and 3rd Saturday of every month.  Must not qualify for public or private insurance (i.e. Medicaid, Medicare, [place] Health Choice, Veterans'  Benefits)  Household income should be no more than 200% of the poverty level The clinic cannot treat you if you are pregnant or think you are pregnant  Sexually transmitted diseases are not treated at the clinic.    Dental Care: Organization         Address  Phone  Notes  [facility] of [facility] [phone] Accepts children up to age 60 who are enrolled in [place] or [place]; pregnant women with a Medicaid card; and children who have applied for Medicaid or  [place] Health Choice, but were declined, whose parents can pay a reduced fee at time of service.  [facility] Department of [facility]  [address], [place] [phone] Accepts children up to age 51 who are enrolled in [place] or [place]; pregnant women with a Medicaid card; and children who have applied for Medicaid or [place] Health Choice, but were declined, whose parents can pay a reduced fee at time of service.  [place] Adult Dental Access PROGRAM  [place] [phone] Patients are seen by appointment only. Walk-ins are not accepted. [place] will see patients 26 years of age and older. Monday - Tuesday (8am-5pm) Most Wednesdays (8:30-5pm) $30 per visit, cash only  [facility] Adult Dental Access PROGRAM  [address], [facility] [phone] Patients are seen by appointment only. Walk-ins are not accepted. [place] will see patients 73 years of age and older. One Wednesday Evening (Monthly: Volunteer Based).  $30 per visit, cash only  [place]  [phone] for adults; Children under age 73, call Graduate Pediatric Dentistry at [phone]. Children aged 68-14, please call [phone] to request a pediatric application.  Dental services are provided in all areas of dental care including fillings, crowns and bridges, complete and partial  dentures, implants, gum treatment, root canals, and extractions. Preventive care is also provided. Treatment is provided to both adults and children. Patients are selected via a lottery and there is often a waiting list.   [facility] [place] [address] [place]  [phone] [email or web address]   [facility] [address] [place], [place] [phone], Ext. [phone] Second and Fourth Thursday of each month, opens at 6:30 AM; Clinic ends at 9 AM.  Patients are seen on a first-come first-served basis, and a limited number are seen during each clinic.   [facility]  [address] [place]ard Danker [place], [place] [phone]   Eligibility Requirements You must have lived in [place], [place], or [place] counties for at least the last three months.   You cannot be eligible for state or federal sponsored [employer], including [employer], [place], or [employer].   You generally cannot be eligible for healthcare insurance through your employer.    How to apply: Eligibility screenings are held every Tuesday and Wednesday afternoon from 1:00 pm until 4:00 pm. You do not need an appointment for the interview!  [facility] [address] [place], [place]   [place]  [place] Department  [place]  [phone]    Behavioral Health Resources in the Community: Intensive Outpatient Programs Organization         Address  Phone  Notes  [place]. [address] [place], [place] [phone]   [facility] [address] [place], [place]   ADS: Alcohol & Drug Svcs [address], [place], [place]   [place] [address]  [place], [place] or [phone]   Substance Abuse Resources Organization          Address  Phone  Notes  Alcohol and Drug Services  [phone]   [facility]  [phone]   [facility] [place]  [phone]   Chinita Pester  [phone]   Residential & Outpatient Substance Abuse Program  [phone]  Psychological Services Organization         Address  Phone  Notes  [facility] [place]  [place]  [phone]   [place] [address] [place] or [phone]    Mobile Crisis Teams Organization         Address  Phone  Notes  [facility], Mobile Crisis Care Unit  [phone]   [facility]  [address] [place], [place]   [place] Levels [address] [place] [phone]    Self-Help/Support Groups Organization         Address  Phone             Notes  [place]. of [place] - variety of support groups  [place] Call for more information  Narcotics Anonymous (NA), [facility] [address], [employer] [place]  2 meetings at this location   Special educational needs teacher         Address  Phone  Notes  [facility] Residential Treatment [place],    [place]  [phone]   [facility]  [address], [place] [address]64332, [place], [place]   [place], [place] [phone] Admissions: 8am-3pm M-F  Incentives Substance [place] [address],    [place], [place] [phone]   [facility] [address] [place] [place], [place], [place]   [facility] [address],  [place], [place]   [facility] - Intensive Outpatient [address], [place], [place]   [facility] ([place] [address]  [place], [place] [phone] or [phone]   [facility] Services (RTS) [address] [place], [place] Accepts Medicaid  [facility] [place] [address],  [place] [phone] Substance Abuse/Addiction Treatment   [facility] Organization         Address  Phone  Notes  [facility]  [phone]   Domenic Schwab, PhD [address] [place]lis [place], [place]   [phone] or [phone]   [place], [place] [phone]   [facility] [address] [place], [place] [phone] Insurance/Medicaid/sponsorship through [facility] and Families [address] [place]                                    [place], [place] [phone] [place] [address] [place], [place] [phone]    Dr. Adele Schilder  [phone]   [facility] Dept. 1) [address] [place] 2) [place] 3)  [place] [address], [place] [phone]  [phone]   [place] [phone] or [phone] (After Hours)

## 2014-03-19 NOTE — ED Provider Notes (Signed)
Pt d/w Dr. Elesa MassedWard.  Arthrocentesis results pending to r/o joint. Gram stain negative. 9000 white blood cells, inflammatory range arthritis. No crystals noted. Plan will be symptomatic treatment and treatment for his inflammatory arthritis. Prednisone, naproxen, Percocet. Avoid excessive use. Primary care follow-up. His blood pressures have trended downward and currently 135/82. His knee is down to a 1-2/10 with pain. He is appropriate for discharge.  Rolland PorterMark Mataeo Ingwersen, MD 03/19/14 [phone]

## 2014-03-22 LAB — BODY FLUID CULTURE: CULTURE: NO GROWTH

## 2015-02-01 ENCOUNTER — Emergency Department (HOSPITAL_COMMUNITY): Payer: Self-pay

## 2015-02-01 ENCOUNTER — Encounter (HOSPITAL_COMMUNITY): Payer: Self-pay | Admitting: Emergency Medicine

## 2015-02-01 ENCOUNTER — Emergency Department (HOSPITAL_COMMUNITY)
Admission: EM | Admit: 2015-02-01 | Discharge: 2015-02-02 | Disposition: A | Discharge status: Home / Self Care | Payer: Self-pay | Attending: Emergency Medicine | Admitting: Emergency Medicine

## 2015-02-01 DIAGNOSIS — I209 Angina pectoris, unspecified: Secondary | ICD-10-CM | POA: Insufficient documentation

## 2015-02-01 DIAGNOSIS — Z9114 Patient's other noncompliance with medication regimen: Secondary | ICD-10-CM

## 2015-02-01 DIAGNOSIS — Z9119 Patient's noncompliance with other medical treatment and regimen: Secondary | ICD-10-CM | POA: Insufficient documentation

## 2015-02-01 DIAGNOSIS — R011 Cardiac murmur, unspecified: Secondary | ICD-10-CM | POA: Insufficient documentation

## 2015-02-01 DIAGNOSIS — Z8639 Personal history of other endocrine, nutritional and metabolic disease: Secondary | ICD-10-CM | POA: Insufficient documentation

## 2015-02-01 DIAGNOSIS — R040 Epistaxis: Secondary | ICD-10-CM | POA: Insufficient documentation

## 2015-02-01 DIAGNOSIS — Z8739 Personal history of other diseases of the musculoskeletal system and connective tissue: Secondary | ICD-10-CM | POA: Insufficient documentation

## 2015-02-01 DIAGNOSIS — Z91040 Latex allergy status: Secondary | ICD-10-CM | POA: Insufficient documentation

## 2015-02-01 DIAGNOSIS — K219 Gastro-esophageal reflux disease without esophagitis: Secondary | ICD-10-CM | POA: Insufficient documentation

## 2015-02-01 DIAGNOSIS — I10 Essential (primary) hypertension: Secondary | ICD-10-CM | POA: Insufficient documentation

## 2015-02-01 LAB — BASIC METABOLIC PANEL
ANION GAP: 10 (ref 5–15)
BUN: 12 mg/dL (ref 6–20)
CALCIUM: 10 mg/dL (ref 8.9–10.3)
CO2: 26 mmol/L (ref 22–32)
Chloride: 101 mmol/L (ref 101–111)
Creatinine, Ser: 1.04 mg/dL (ref 0.61–1.24)
GFR calc Af Amer: >60 mL/min (ref >60)
GFR calc non Af Amer: >60 mL/min (ref >60)
Glucose, Bld: 122 mg/dL — ABNORMAL HIGH (ref 65–99)
Potassium: 3.4 mmol/L — ABNORMAL LOW (ref 3.5–5.1)
Sodium: 137 mmol/L (ref 135–145)

## 2015-02-01 LAB — CBC
HCT: 44.2 % (ref 39.0–52.0)
HEMOGLOBIN: 14.9 g/dL (ref 13.0–17.0)
MCH: 24.3 pg — AB (ref 26.0–34.0)
MCHC: 33.7 g/dL (ref 30.0–36.0)
MCV: 72.1 fL — AB (ref 78.0–100.0)
Platelets: 156 10*3/uL (ref 150–400)
RBC: 6.13 MIL/uL — ABNORMAL HIGH (ref 4.22–5.81)
RDW: 13.9 % (ref 11.5–15.5)
WBC: 5.1 10*3/uL (ref 4.0–10.5)

## 2015-02-01 LAB — I-STAT TROPONIN, ED: Troponin i, poc: 0.01 ng/mL (ref 0.00–0.08)

## 2015-02-01 MED ORDER — AMLODIPINE BESYLATE 10 MG PO TABS: 10.0000 mg | ORAL_TABLET | Freq: Every day | ORAL | Status: DC

## 2015-02-01 MED ORDER — AMLODIPINE BESYLATE 5 MG PO TABS
10.0000 mg | ORAL_TABLET | Freq: Every day | ORAL | Status: DC
  Administered 2015-02-01: 10 mg via ORAL
  Filled 2015-02-01: qty 2

## 2015-02-01 MED ORDER — HYDROCHLOROTHIAZIDE 25 MG PO TABS
25.0000 mg | ORAL_TABLET | Freq: Every day | ORAL | Status: DC
  Administered 2015-02-01: 25 mg via ORAL
  Filled 2015-02-01: qty 1

## 2015-02-01 MED ORDER — LABETALOL HCL 5 MG/ML IV SOLN
20.0000 mg | INTRAVENOUS | Status: DC | PRN
  Administered 2015-02-01 (×3): 20 mg via INTRAVENOUS
  Filled 2015-02-01 (×3): qty 4

## 2015-02-01 MED ORDER — HYDROCHLOROTHIAZIDE 25 MG PO TABS: 25.0000 mg | ORAL_TABLET | Freq: Every day | ORAL | Status: DC

## 2015-02-01 MED ORDER — LISINOPRIL 20 MG PO TABS
20.0000 mg | ORAL_TABLET | Freq: Once | ORAL | Status: AC
  Administered 2015-02-01: 20 mg via ORAL
  Filled 2015-02-01: qty 1

## 2015-02-01 MED ORDER — LISINOPRIL 20 MG PO TABS: 20.0000 mg | ORAL_TABLET | Freq: Once | ORAL | Status: DC

## 2015-02-01 NOTE — ED Notes (Signed)
Pt states he woke up tonight to go to work and had "hand sized" amount of blood on pillow from nosebleed.  Also reports pain to R side of chest with sob and dizziness.  Denies nausea and vomiting.

## 2015-02-01 NOTE — ED Notes (Signed)
MD to see and assess patient before RN assessment. 

## 2015-02-01 NOTE — Discharge Instructions (Signed)
Hypertension °Hypertension, commonly called high blood pressure, is when the force of blood pumping through your arteries is too strong. Your arteries are the blood vessels that carry blood from your heart throughout your body. A blood pressure reading consists of a higher number over a lower number, such as 110/72. The higher number (systolic) is the pressure inside your arteries when your heart pumps. The lower number (diastolic) is the pressure inside your arteries when your heart relaxes. Ideally you want your blood pressure below 120/80. °Hypertension forces your heart to work harder to pump blood. Your arteries may become narrow or stiff. Having untreated or uncontrolled hypertension can cause heart attack, stroke, kidney disease, and other problems. °RISK FACTORS °Some risk factors for high blood pressure are controllable. Others are not.  °Risk factors you cannot control include:  °· Race. You may be at higher risk if you are African American. °· Age. Risk increases with age. °· Gender. Men are at higher risk than women before age 45 years. After age 65, women are at higher risk than men. °Risk factors you can control include: °· Not getting enough exercise or physical activity. °· Being overweight. °· Getting too much fat, sugar, calories, or salt in your diet. °· Drinking too much alcohol. °SIGNS AND SYMPTOMS °Hypertension does not usually cause signs or symptoms. Extremely high blood pressure (hypertensive crisis) may cause headache, anxiety, shortness of breath, and nosebleed. °DIAGNOSIS °To check if you have hypertension, your health care provider will measure your blood pressure while you are seated, with your arm held at the level of your heart. It should be measured at least twice using the same arm. Certain conditions can cause a difference in blood pressure between your right and left arms. A blood pressure reading that is higher than normal on one occasion does not mean that you need treatment. If  it is not clear whether you have high blood pressure, you may be asked to return on a different day to have your blood pressure checked again. Or, you may be asked to monitor your blood pressure at home for 1 or more weeks. °TREATMENT °Treating high blood pressure includes making lifestyle changes and possibly taking medicine. Living a healthy lifestyle can help lower high blood pressure. You may need to change some of your habits. °Lifestyle changes may include: °· Following the DASH diet. This diet is high in fruits, vegetables, and whole grains. It is low in salt, red meat, and added sugars. °· Keep your sodium intake below 2,300 mg per day. °· Getting at least 30-45 minutes of aerobic exercise at least 4 times per week. °· Losing weight if necessary. °· Not smoking. °· Limiting alcoholic beverages. °· Learning ways to reduce stress. °Your health care provider may prescribe medicine if lifestyle changes are not enough to get your blood pressure under control, and if one of the following is true: °· You are 18-59 years of age and your systolic blood pressure is above 140. °· You are 60 years of age or older, and your systolic blood pressure is above 150. °· Your diastolic blood pressure is above 90. °· You have diabetes, and your systolic blood pressure is over 140 or your diastolic blood pressure is over 90. °· You have kidney disease and your blood pressure is above 140/90. °· You have heart disease and your blood pressure is above 140/90. °Your personal target blood pressure may vary depending on your medical conditions, your age, and other factors. °HOME CARE INSTRUCTIONS °·   Have your blood pressure rechecked as directed by your health care provider.   °· Take medicines only as directed by your health care provider. Follow the directions carefully. Blood pressure medicines must be taken as prescribed. The medicine does not work as well when you skip doses. Skipping doses also puts you at risk for  problems. °· Do not smoke.   °· Monitor your blood pressure at home as directed by your health care provider.  °SEEK MEDICAL CARE IF:  °· You think you are having a reaction to medicines taken. °· You have recurrent headaches or feel dizzy. °· You have swelling in your ankles. °· You have trouble with your vision. °SEEK IMMEDIATE MEDICAL CARE IF: °· You develop a severe headache or confusion. °· You have unusual weakness, numbness, or feel faint. °· You have severe chest or abdominal pain. °· You vomit repeatedly. °· You have trouble breathing. °MAKE SURE YOU:  °· Understand these instructions. °· Will watch your condition. °· Will get help right away if you are not doing well or get worse. °  °This information is not intended to replace advice given to you by your health care provider. Make sure you discuss any questions you have with your health care provider. °  °Document Released: 03/21/2005 Document Revised: 08/05/2014 Document Reviewed: 01/11/2013 °Elsevier Interactive Patient Education ©2016 Elsevier Inc. ° ° °Emergency Department Resource Guide °1) Find a Doctor and Pay Out of Pocket °Although you won't have to find out who is covered by your insurance plan, it is a good idea to ask around and get recommendations. You will then need to call the office and see if the doctor you have chosen will accept you as a new patient and what types of options they offer for patients who are self-pay. Some doctors offer discounts or will set up payment plans for their patients who do not have insurance, but you will need to ask so you aren't surprised when you get to your appointment. ° °2) Contact Your Local Health Department °Not all health departments have doctors that can see patients for sick visits, but many do, so it is worth a call to see if yours does. If you don't know where your local health department is, you can check in your phone book. The CDC also has a tool to help you locate your state's health  department, and many state websites also have listings of all of their local health departments. ° °3) Find a Walk-in Clinic °If your illness is not likely to be very severe or complicated, you may want to try a walk in clinic. These are popping up all over the country in pharmacies, drugstores, and shopping centers. They're usually staffed by nurse practitioners or physician assistants that have been trained to treat common illnesses and complaints. They're usually fairly quick and inexpensive. However, if you have serious medical issues or chronic medical problems, these are probably not your best option. ° °No Primary Care Doctor: °- Call [facility] at  832-8000 - they can help you locate a primary care doctor that  accepts your insurance, provides certain services, etc. °- Physician Referral Service- 1-800-533-3463 ° °Chronic Pain Problems: °Organization         Address  Phone   Notes  °[facility]  (336) 297-2271 Patients need to be referred by their primary care doctor.  ° °Medication Assistance: °Organization         Address  Phone   Notes  °[place] [facility] [address] °[place], [place] [address] (336) 641-8030 --Must be a resident of [place] °-- Must have NO insurance coverage whatsoever (no Medicaid/ Medicare, etc.) °-- The pt. MUST have a primary care doctor that directs their care regularly and follows them in the community °  °[facility]  (866) 331-1348   °[employer]  (888) 892-1162   ° °Agencies that provide inexpensive medical care: °Organization         Address  Phone   Notes  °[facility]  (336) 832-8035   °[facility]    (336) 832-7272   °[facility] [address] °[place], [place] [address] (336) 832-4777   °[facility] [address], °[place] (336) 271-4999   °[facility]    (336) 373-0678   °[facility]    (336) 272-1050    °[facility] ° [address], [place] Phone:  (336) 832-4444, Fax:  (336) 832-4440 Hours of Operation:  9 am - 6 pm, M-F.  Also accepts Medicaid/Medicare and self-pay.  °[facility] ° [address], [place] Phone: (336) [phone], Fax: (336) [phone]. Hours of Operation:  8:30 am - 5:30 pm, M-F.  Also accepts Medicaid and self-pay.  °[facility] [place] [address], [place] Phone: (336) 878-6027   °[facility] [address], [place], [place] (336)723-1848, Ext. 123 Mondays & Thursdays: 7-9 AM.  First 15 patients are seen on a first come, first serve basis. °  ° °Medicaid-accepting [place] Providers: ° °Organization         Address  Phone   Notes  °[facility] [address], [place] (336) 641-2100 Also accepts self-pay patients.  °[facility] [address], [place] ° (336) 856-9996   °[facility] [address], [place] (336) 288-8857   °[facility] [address] [place] [address], [place] (336) 299-7000   °Veita Bland [address], [place]  ° (336) 373-1557 Only accepts [place] Access Medicaid patients after they have their name applied to their card.  ° °Self-Pay (no insurance) in [place]: ° °Organization         Address  Phone   Notes  °Sickle Cell Patients, [facility] [address], [place] (336) 832-1970   °[facility] [address], [place] (336) 832-4400   ° [facility] ° [address], [place] (336) 992-4800   °[facility]/Dr. Osei-Bonsu ° [address] [place] [address], [place] or [address], [place] (336) [phone] Phone number for both [place] and [place] locations is the same.  °[facility] [address], [place] (336) 299-0000    °[facility] [address] [place] [address], [place] or [address] (336) 852-7530 °(336) 878-2260   °[facility] [address], [place] (336) 350-1642, phone; (336) 294-5005, fax Sees patients 1st and 3rd Saturday of every month.  Must not qualify for public or private insurance (i.e. Medicaid, Medicare, [place] Health Choice, Veterans' Benefits) • Household income should be no more than 200% of the poverty level •The clinic cannot treat you if you are pregnant or think you are pregnant • Sexually transmitted diseases are not treated at   the clinic.  ° ° °Dental Care: °Organization         Address  Phone  Notes  °[facility] [address], [place] (336) 641-6152 Accepts children up to age 21 who are enrolled in Medicaid or [place] Health Choice; pregnant women with a Medicaid card; and children who have applied for Medicaid or [place] Health Choice, but were declined, whose parents can pay a reduced fee at time of service.  °[facility] [place]  [address], [place] (336) 641-7733 Accepts children up to age 21 who are enrolled in Medicaid or [place] Health Choice; pregnant women with a Medicaid card; and children who have applied for Medicaid or [place] Health Choice, but were declined, whose parents can pay a reduced fee at time of service.  °[facility] ° [address], [place] (336) 641-4533 Patients are seen by appointment only. Walk-ins are not accepted. [facility] will see patients 18 years of age and older. °Monday - Tuesday (8am-5pm) °Most Wednesdays (8:30-5pm) °$30 per visit, cash only  °[facility] ° [address], [place] (336) [phone] Patients are seen by appointment only. Walk-ins are not accepted. [facility] will see patients 18 years of age and older. °One Wednesday Evening (Monthly: Volunteer Based).   $30 per visit, cash only  °[facility]  (919) [phone] for adults; Children under age 4, call Graduate Pediatric Dentistry at ([phone]) [phone]. Children aged 4-14, please call ([phone]) [phone] to request a pediatric application. ° Dental services are provided in all areas of dental care including fillings, crowns and bridges, complete and partial dentures, implants, gum treatment, root canals, and extractions. Preventive care is also provided. Treatment is provided to both adults and children. °Patients are selected via a lottery and there is often a waiting list. °  °[facility] [address], °[place] ° (336) 763-8833 [email or web address] °  °[facility] [address], [place], [place] (336)723-1848, Ext. 123 Second and Fourth Thursday of each month, opens at 6:30 AM; Clinic ends at 9 AM.  Patients are seen on a first-come first-served basis, and a limited number are seen during each clinic.  ° °[facility] ° [address], [place], [place] [address] [place] (336) 723-7904   Eligibility Requirements °You must have lived in [place], or [place] counties for at least the last three months. °  You cannot be eligible for state or federal sponsored healthcare insurance, including Veterans Administration, Medicaid, or Medicare. °  You generally cannot be eligible for healthcare insurance through your employer.  °  How to apply: °Eligibility screenings are held every Tuesday and Wednesday afternoon from 1:00 pm until 4:00 pm. You do not need an appointment for the interview!  °[facility] [address], [place], [place] 336-631-2330   °[facility]  336-342-8273   °[facility]  336-703-3100   °[name]  336-570-6415   ° °Behavioral Health Resources in the Community: °Intensive Outpatient Programs °Organization         Address  Phone  Notes  °[facility] [address], [place], [place] 336-878-6098   °[facility] [address], [place], [place] 336-832-9800   °[facility] [address], , [place] ° 336-882-2125   °[facility] [address],  °[place], [place] 1-800-853-5163 or 336-641-4981   °Substance Abuse Resources °Organization         Address  Phone  Notes  °[facility]  336-882-2125   °[facility]  336-784-9470   °[facility]  336-285-9073   °[facility]  336-845-3988   °Residential & Outpatient Substance Abuse Program  1-800-659-3381   °Psychological Services °Organization         Address  Phone  Notes  °[facility]  336- 832-9600   °[facility]  336- 378-7881   °[facility] [address], [place] 1-800-853-5163 or 336-641-4981   ° °Mobile Crisis Teams °Organization         Address  Phone  Notes  °[facility], Mobile Crisis Care Unit  1-877-626-1772   °Assertive °[facility] ° [address] [place], [place] 336-834-9664   °Sharon DeEsch [address] °[place] 336-554-5454   ° °Self-Help/Support Groups °Organization         Address  Phone             Notes  °[facility] - variety of support groups  336- 373-1402 Call for more information  °Narcotics Anonymous (NA), [facility] [address], °[place]  2 meetings at this location  ° °Residential Treatment Programs °Organization         Address  Phone  Notes  °[facility] [address],    °[place]  1-866-801-8205   °[facility] ° [address], [place], [place] 704-293-8524   °[facility] [address], [place] 336-845-3988 Admissions: 8am-3pm M-F  °[facility] [address]    °[place], [place] 336-841-1104   °[facility] [address], [place], [place] 336-379-7146   °[facility] [address]  °[place], [place] 336-285-9073   °[facility] - Intensive Outpatient [address], [place], [place] 336-852-3033   °[facility] [address]  °[place], [place] 1-877-615-2722 or 336-784-9470   °[facility] [address] [place], [place] 336-227-7417 Accepts Medicaid  °[facility] [address]  °[place] 1-800-659-3381 Substance Abuse/Addiction Treatment  ° °[place] Behavioral Health Resources °Organization         Address  Phone  Notes  °[facility]  (888) 581-9988   °Julie Brannon, PhD [address] [place], [place]   (336) 349-5553 or (336) 951-0000   °[facility]   [address] °[place], [place] (336) 349-4454   °[facility] [address], [place], [place] (336) 342-8316 Insurance/Medicaid/sponsorship through [facility]  °[facility] [address]                                    [place], [place] (336) 342-8316 Therapy/tele-psych/case  °[facility] [address]  ° [place],  (336) 349-2233    °Dr. Arfeen  (336) 349-4544   °[facility] [place]  [employer] [place] [facility] 1) [address], [place] °2) [address], [place] °3)  [address], [place] (336) 349-3220 °(336) 342-7768 ° °(336) 342-8140   °[place] [facility] (336) 342-1394 or (336) 342-3537 (After Hours)    ° ° ° °

## 2015-02-01 NOTE — ED Provider Notes (Signed)
CSN: [id number]     Arrival date & time 02/01/15  2114 History   First MD Initiated Contact with Patient 02/01/15 2215     Chief Complaint  Patient presents with  . Chest Pain  . Epistaxis     (Consider location/radiation/quality/duration/timing/severity/associated sxs/prior Treatment) The history is provided by the patient.     [name] is a 54 y.o. male who is here for evaluation of tingling in face, nose bleeding, and mild right-sided chest pain or edema noticed this problem today when he was sleeping and woke from a nap. He has had some mild shortness of breath and dizziness today as well. He is not vomiting. He is supposed to be taking numerous medicines for blood pressure but stopped because he does not have a primary care doctor. There are no other known modifying factors.   Past Medical History  Diagnosis Date  . High cholesterol   . Heart murmur     "leaky valve"  . Angina   . Exertional dyspnea   . Sleep apnea 08/2010    "not required to wear mask"  . Gout     "sometimes flares up even w/Allupurinol"  . Hypertension   . GERD (gastroesophageal reflux disease)    Past Surgical History  Procedure Laterality Date  . Upper gastrointestinal endoscopy  03/2011  . Colonoscopy  03/2011  . Knee arthroscopy  2004    "right; w/ligament repair in kneecap"  . Multiple tooth extractions  06/2010    full mouth  . Tonsillectomy      "I was a kid"   Family History  Problem Relation Age of Onset  . Hypertension     Social History  Substance Use Topics  . Smoking status: Never Smoker   . Smokeless tobacco: Never Used  . Alcohol Use: No    Review of Systems  All other systems reviewed and are negative.     Allergies  Adhesive and Latex  Home Medications   Prior to Admission medications   Medication Sig Start Date End Date Taking? Authorizing Provider  amLODipine (NORVASC) 10 MG tablet Take 1 tablet (10 mg total) by mouth daily. 02/02/15   Mancel Bale, MD   hydrochlorothiazide (HYDRODIURIL) 25 MG tablet Take 1 tablet (25 mg total) by mouth daily. 02/02/15   Mancel Bale, MD  lisinopril (PRINIVIL,ZESTRIL) 20 MG tablet Take 1 tablet (20 mg total) by mouth once. 02/01/15   Mancel Bale, MD   BP 151/100 mmHg  Pulse 72  Temp(Src) 99.2 F (37.3 C) (Oral)  Resp 19  Ht  (1.727 m)  Wt 211 lb (95.709 kg)  BMI 32.09 kg/m2  SpO2 95% Physical Exam  Constitutional: He is oriented to person, place, and time. He appears well-developed and well-nourished.  HENT:  Head: Normocephalic and atraumatic.  Right Ear: External ear normal.  Left Ear: External ear normal.  Eyes: Conjunctivae and EOM are normal. Pupils are equal, round, and reactive to light.  Neck: Normal range of motion and phonation normal. Neck supple.  Cardiovascular: Normal rate and regular rhythm.   2/6 systolic murmur  Pulmonary/Chest: Effort normal and breath sounds normal. He exhibits no bony tenderness.  Abdominal: Soft. There is no tenderness.  Musculoskeletal: Normal range of motion.  Neurological: He is alert and oriented to person, place, and time. No cranial nerve deficit or sensory deficit. He exhibits normal muscle tone. Coordination normal.  Skin: Skin is warm, dry and intact.  Psychiatric: He has a normal mood and affect. His  behavior is normal. Judgment and thought content normal.  Nursing note and vitals reviewed.   ED Course  Procedures (including critical care time)  Medications  labetalol (NORMODYNE,TRANDATE) injection 20 mg (20 mg Intravenous Given 02/01/15 2320)  amLODipine (NORVASC) tablet 10 mg (not administered)  hydrochlorothiazide (HYDRODIURIL) tablet 25 mg (not administered)  lisinopril (PRINIVIL,ZESTRIL) tablet 20 mg (not administered)    Patient Vitals for the past 24 hrs:  BP Temp Temp src Pulse Resp SpO2 Height Weight  02/01/15 2330 151/100 mmHg - - 72 - - - -  02/01/15 2310 154/99 mmHg - - 73 19 - - -  02/01/15 2301 142/93 mmHg - - - - - -  -  02/01/15 2300 142/93 mmHg - - 76 - 95 % - -  02/01/15 2255 161/97 mmHg - - 77 20 95 % - -  02/01/15 2245 (!) 166/103 mmHg - - 75 25 94 % - -  02/01/15 2230 (!) 190/127 mmHg - - 79 23 97 % - -  02/01/15 2222 - - - 82 - 97 % - -  02/01/15 2221 (!) 181/126 mmHg - - - - - - -  02/01/15 2132 (!) 199/127 mmHg 99.2 F (37.3 C) Oral 89 20 99 % 5\' 8"  (1.727 m) 211 lb (95.709 kg)      CRITICAL CARE Performed by: Mancel BaleWENTZ,Mattias Walmsley L Total critical care time: 35 minutes minutes Critical care time was exclusive of separately billable procedures and treating other patients. Critical care was necessary to treat or prevent imminent or life-threatening deterioration. Critical care was time spent personally by me on the following activities: development of treatment plan with patient and/or surrogate as well as nursing, discussions with consultants, evaluation of patient's response to treatment, examination of patient, obtaining history from patient or surrogate, ordering and performing treatments and interventions, ordering and review of laboratory studies, ordering and review of radiographic studies, pulse oximetry and re-evaluation of patient's condition.   Labs Review Labs Reviewed  BASIC METABOLIC PANEL - Abnormal; Notable for the following:    Potassium 3.4 (*)    Glucose, Bld 122 (*)    All other components within normal limits  CBC - Abnormal; Notable for the following:    RBC 6.13 (*)    MCV 72.1 (*)    MCH 24.3 (*)    All other components within normal limits  I-STAT TROPOININ, ED    Imaging Review Dg Chest 2 View  02/01/2015  CLINICAL DATA:  RIGHT chest pain radiating to the RIGHT arm. Nose bleed, shortness of breath, heartburn, tired. EXAM: CHEST  2 VIEW COMPARISON:  Chest radiograph February 06, 2014 FINDINGS: Cardiomediastinal silhouette is normal. Mildly calcified aortic knob. The lungs are clear without pleural effusions or focal consolidations. Trachea projects midline and there is  no pneumothorax. Soft tissue planes and included osseous structures are non-suspicious. IMPRESSION: No acute cardiopulmonary process. Electronically Signed   By: Awilda Metroourtnay  Bloomer M.D.   On: 02/01/2015 23:04   I have personally reviewed and evaluated these images and lab results as part of my medical decision-making.   EKG Interpretation   Date/Time:  Sunday February 01 2015 21:24:44 EDT Ventricular Rate:  92 PR Interval:  154 QRS Duration: 88 QT Interval:  390 QTC Calculation: 482 R Axis:   103 Text Interpretation:  Normal sinus rhythm Biatrial enlargement Rightward  axis Anterior infarct , age undetermined T wave abnormality, consider  inferior ischemia Abnormal ECG Sinus rhythm Biatrial enlargement ST-t wave  abnormality , increased ST elevation in  v1 Abnormal ekg Confirmed by  Gerhard Munch  MD [phone]) on 02/01/2015 9:27:48 PM      MDM   Final diagnoses:  Essential hypertension  Noncompliance with medication regimen    Hypertension with secondary symptoms, improved after treatment. Doubt CVA, ACS or metabolic instability.  Nursing Notes Reviewed/ Care Coordinated Applicable Imaging Reviewed Interpretation of Laboratory Data incorporated into ED treatment  The patient appears reasonably screened and/or stabilized for discharge and I doubt any other medical condition or other [facility] requiring further screening, evaluation, or treatment in the ED at this time prior to discharge.  Plan: Home Medications- Norvasc, Lisinopril, HCTZ; Home Treatments- rest; return here if the recommended treatment, does not improve the symptoms; Recommended follow up- PCP 1-2 weeks     Mancel Bale, MD 02/04/15 1520

## 2015-02-02 NOTE — ED Notes (Signed)
Reviewed discharge instructions and prescription medications with patient.  Patient verbalized understanding r/e medications and follow-up information.  VSS.

## 2015-04-19 ENCOUNTER — Encounter (HOSPITAL_COMMUNITY): Payer: Self-pay | Admitting: Emergency Medicine

## 2015-04-19 ENCOUNTER — Emergency Department (HOSPITAL_COMMUNITY)
Admission: EM | Admit: 2015-04-19 | Discharge: 2015-04-20 | Disposition: A | Discharge status: Home / Self Care | Payer: Self-pay | Attending: Emergency Medicine | Admitting: Emergency Medicine

## 2015-04-19 DIAGNOSIS — R42 Dizziness and giddiness: Secondary | ICD-10-CM | POA: Insufficient documentation

## 2015-04-19 DIAGNOSIS — Z9114 Patient's other noncompliance with medication regimen: Secondary | ICD-10-CM

## 2015-04-19 DIAGNOSIS — I10 Essential (primary) hypertension: Secondary | ICD-10-CM | POA: Insufficient documentation

## 2015-04-19 DIAGNOSIS — Z9119 Patient's noncompliance with other medical treatment and regimen: Secondary | ICD-10-CM | POA: Insufficient documentation

## 2015-04-19 DIAGNOSIS — Z8719 Personal history of other diseases of the digestive system: Secondary | ICD-10-CM | POA: Insufficient documentation

## 2015-04-19 DIAGNOSIS — Z8639 Personal history of other endocrine, nutritional and metabolic disease: Secondary | ICD-10-CM | POA: Insufficient documentation

## 2015-04-19 DIAGNOSIS — Z79899 Other long term (current) drug therapy: Secondary | ICD-10-CM | POA: Insufficient documentation

## 2015-04-19 DIAGNOSIS — R011 Cardiac murmur, unspecified: Secondary | ICD-10-CM | POA: Insufficient documentation

## 2015-04-19 DIAGNOSIS — Z91040 Latex allergy status: Secondary | ICD-10-CM | POA: Insufficient documentation

## 2015-04-19 DIAGNOSIS — Z8739 Personal history of other diseases of the musculoskeletal system and connective tissue: Secondary | ICD-10-CM | POA: Insufficient documentation

## 2015-04-19 DIAGNOSIS — Z8669 Personal history of other diseases of the nervous system and sense organs: Secondary | ICD-10-CM | POA: Insufficient documentation

## 2015-04-19 LAB — DIFFERENTIAL
BASOS PCT: 1 %
Basophils Absolute: 0 10*3/uL (ref 0.0–0.1)
Eosinophils Absolute: 0.1 10*3/uL (ref 0.0–0.7)
Eosinophils Relative: 1 %
Lymphocytes Relative: 47 %
Lymphs Abs: 2.8 10*3/uL (ref 0.7–4.0)
MONO ABS: 0.4 10*3/uL (ref 0.1–1.0)
MONOS PCT: 7 %
NEUTROS ABS: 2.7 10*3/uL (ref 1.7–7.7)
Neutrophils Relative %: 45 %

## 2015-04-19 LAB — I-STAT TROPONIN, ED: Troponin i, poc: 0.03 ng/mL (ref 0.00–0.08)

## 2015-04-19 LAB — PROTIME-INR
INR: 1.04 (ref 0.00–1.49)
Prothrombin Time: 13.8 seconds (ref 11.6–15.2)

## 2015-04-19 LAB — CBC
HEMATOCRIT: 42.3 % (ref 39.0–52.0)
Hemoglobin: 14.2 g/dL (ref 13.0–17.0)
MCH: 24.1 pg — ABNORMAL LOW (ref 26.0–34.0)
MCHC: 33.6 g/dL (ref 30.0–36.0)
MCV: 71.8 fL — AB (ref 78.0–100.0)
Platelets: 173 10*3/uL (ref 150–400)
RBC: 5.89 MIL/uL — ABNORMAL HIGH (ref 4.22–5.81)
RDW: 13.9 % (ref 11.5–15.5)
WBC: 5.9 10*3/uL (ref 4.0–10.5)

## 2015-04-19 LAB — APTT: aPTT: 29 seconds (ref 24–37)

## 2015-04-19 NOTE — ED Notes (Addendum)
C/o headache and dizziness since Friday.  Reports generalized weakness.  Took wife's Oxycodone for pain without relief and vomited 3 hours after taking medication.  Out of BP medication over 1 month.

## 2015-04-20 ENCOUNTER — Encounter (HOSPITAL_COMMUNITY): Payer: Self-pay | Admitting: Radiology

## 2015-04-20 ENCOUNTER — Emergency Department (HOSPITAL_COMMUNITY): Payer: Self-pay

## 2015-04-20 LAB — URINALYSIS, ROUTINE W REFLEX MICROSCOPIC
BILIRUBIN URINE: NEGATIVE
Glucose, UA: NEGATIVE mg/dL
Hgb urine dipstick: NEGATIVE
Ketones, ur: NEGATIVE mg/dL
Leukocytes, UA: NEGATIVE
NITRITE: NEGATIVE
PH: 6.5 (ref 5.0–8.0)
Protein, ur: >300 mg/dL — AB
SPECIFIC GRAVITY, URINE: 1.02 (ref 1.005–1.030)

## 2015-04-20 LAB — COMPREHENSIVE METABOLIC PANEL
ALBUMIN: 3.5 g/dL (ref 3.5–5.0)
ALT: 30 U/L (ref 17–63)
AST: 29 U/L (ref 15–41)
Alkaline Phosphatase: 66 U/L (ref 38–126)
Anion gap: 12 (ref 5–15)
BUN: 13 mg/dL (ref 6–20)
CHLORIDE: 101 mmol/L (ref 101–111)
CO2: 26 mmol/L (ref 22–32)
CREATININE: 0.94 mg/dL (ref 0.61–1.24)
Calcium: 9.5 mg/dL (ref 8.9–10.3)
GFR calc non Af Amer: >60 mL/min (ref >60)
GLUCOSE: 120 mg/dL — AB (ref 65–99)
Potassium: 3.3 mmol/L — ABNORMAL LOW (ref 3.5–5.1)
SODIUM: 139 mmol/L (ref 135–145)
Total Bilirubin: 0.5 mg/dL (ref 0.3–1.2)
Total Protein: 7.1 g/dL (ref 6.5–8.1)

## 2015-04-20 LAB — URINE MICROSCOPIC-ADD ON

## 2015-04-20 MED ORDER — METOPROLOL TARTRATE 25 MG PO TABS
25.0000 mg | ORAL_TABLET | Freq: Once | ORAL | Status: AC
  Administered 2015-04-20: 25 mg via ORAL
  Filled 2015-04-20: qty 1

## 2015-04-20 MED ORDER — AMLODIPINE BESYLATE 10 MG PO TABS: 10.0000 mg | ORAL_TABLET | Freq: Every day | ORAL | Status: DC

## 2015-04-20 MED ORDER — HYDROCHLOROTHIAZIDE 25 MG PO TABS
25.0000 mg | ORAL_TABLET | Freq: Every day | ORAL | Status: DC
  Administered 2015-04-20 (×2): 25 mg via ORAL
  Filled 2015-04-20 (×2): qty 1

## 2015-04-20 MED ORDER — METOCLOPRAMIDE HCL 10 MG PO TABS
10.0000 mg | ORAL_TABLET | Freq: Once | ORAL | Status: AC
  Administered 2015-04-20: 10 mg via ORAL
  Filled 2015-04-20: qty 1

## 2015-04-20 MED ORDER — DIVALPROEX SODIUM 250 MG PO DR TAB
500.0000 mg | DELAYED_RELEASE_TABLET | Freq: Two times a day (BID) | ORAL | Status: DC
  Administered 2015-04-20: 500 mg via ORAL
  Filled 2015-04-20: qty 2

## 2015-04-20 MED ORDER — AMLODIPINE BESYLATE 5 MG PO TABS
5.0000 mg | ORAL_TABLET | Freq: Once | ORAL | Status: AC
  Administered 2015-04-20: 5 mg via ORAL
  Filled 2015-04-20: qty 1

## 2015-04-20 MED ORDER — HYDROCHLOROTHIAZIDE 25 MG PO TABS: 25.0000 mg | ORAL_TABLET | Freq: Every day | ORAL | Status: DC

## 2015-04-20 MED ORDER — KETOROLAC TROMETHAMINE 60 MG/2ML IM SOLN
60.0000 mg | Freq: Once | INTRAMUSCULAR | Status: AC
  Administered 2015-04-20: 60 mg via INTRAMUSCULAR
  Filled 2015-04-20: qty 2

## 2015-04-20 NOTE — Discharge Instructions (Signed)
DASH Eating Plan  DASH stands for "Dietary Approaches to Stop Hypertension." The DASH eating plan is a healthy eating plan that has been shown to reduce high blood pressure (hypertension). Additional health benefits may include reducing the risk of type 2 diabetes mellitus, heart disease, and stroke. The DASH eating plan may also help with weight loss.  WHAT DO I NEED TO KNOW ABOUT THE DASH EATING PLAN?  For the DASH eating plan, you will follow these general guidelines:  · Choose foods with a percent daily value for sodium of less than 5% (as listed on the food label).  · Use salt-free seasonings or herbs instead of table salt or sea salt.  · Check with your health care provider or pharmacist before using salt substitutes.  · Eat lower-sodium products, often labeled as "lower sodium" or "no salt added."  · Eat fresh foods.  · Eat more vegetables, fruits, and low-fat dairy products.  · Choose whole grains. Look for the word "whole" as the first word in the ingredient list.  · Choose fish and skinless chicken or turkey more often than red meat. Limit fish, poultry, and meat to 6 oz (170 g) each day.  · Limit sweets, desserts, sugars, and sugary drinks.  · Choose heart-healthy fats.  · Limit cheese to 1 oz (28 g) per day.  · Eat more home-cooked food and less restaurant, buffet, and fast food.  · Limit fried foods.  · Cook foods using methods other than frying.  · Limit canned vegetables. If you do use them, rinse them well to decrease the sodium.  · When eating at a restaurant, ask that your food be prepared with less salt, or no salt if possible.  WHAT FOODS CAN I EAT?  Seek help from a dietitian for individual calorie needs.  Grains  Whole grain or whole wheat bread. Brown rice. Whole grain or whole wheat pasta. Quinoa, bulgur, and whole grain cereals. Low-sodium cereals. Corn or whole wheat flour tortillas. Whole grain cornbread. Whole grain crackers. Low-sodium crackers.  Vegetables  Fresh or frozen vegetables  (raw, steamed, roasted, or grilled). Low-sodium or reduced-sodium tomato and vegetable juices. Low-sodium or reduced-sodium tomato sauce and paste. Low-sodium or reduced-sodium canned vegetables.   Fruits  All fresh, canned (in natural juice), or frozen fruits.  Meat and Other Protein Products  Ground beef (85% or leaner), grass-fed beef, or beef trimmed of fat. Skinless chicken or turkey. Ground chicken or turkey. Pork trimmed of fat. All fish and seafood. Eggs. Dried beans, peas, or lentils. Unsalted nuts and seeds. Unsalted canned beans.  Dairy  Low-fat dairy products, such as skim or 1% milk, 2% or reduced-fat cheeses, low-fat ricotta or cottage cheese, or plain low-fat yogurt. Low-sodium or reduced-sodium cheeses.  Fats and Oils  Tub margarines without trans fats. Light or reduced-fat mayonnaise and salad dressings (reduced sodium). Avocado. Safflower, olive, or canola oils. Natural peanut or almond butter.  Other  Unsalted popcorn and pretzels.  The items listed above may not be a complete list of recommended foods or beverages. Contact your dietitian for more options.  WHAT FOODS ARE NOT RECOMMENDED?  Grains  White bread. White pasta. White rice. Refined cornbread. Bagels and croissants. Crackers that contain trans fat.  Vegetables  Creamed or fried vegetables. Vegetables in a cheese sauce. Regular canned vegetables. Regular canned tomato sauce and paste. Regular tomato and vegetable juices.  Fruits  Dried fruits. Canned fruit in light or heavy syrup. Fruit juice.  Meat and Other Protein   Products  Fatty cuts of meat. Ribs, chicken wings, bacon, sausage, bologna, salami, chitterlings, fatback, hot dogs, bratwurst, and packaged luncheon meats. Salted nuts and seeds. Canned beans with salt.  Dairy  Whole or 2% milk, cream, half-and-half, and cream cheese. Whole-fat or sweetened yogurt. Full-fat cheeses or blue cheese. Nondairy creamers and whipped toppings. Processed cheese, cheese spreads, or cheese  curds.  Condiments  Onion and garlic salt, seasoned salt, table salt, and sea salt. Canned and packaged gravies. Worcestershire sauce. Tartar sauce. Barbecue sauce. Teriyaki sauce. Soy sauce, including reduced sodium. Steak sauce. Fish sauce. Oyster sauce. Cocktail sauce. Horseradish. Ketchup and mustard. Meat flavorings and tenderizers. Bouillon cubes. Hot sauce. Tabasco sauce. Marinades. Taco seasonings. Relishes.  Fats and Oils  Butter, stick margarine, lard, shortening, ghee, and bacon fat. Coconut, palm kernel, or palm oils. Regular salad dressings.  Other  Pickles and olives. Salted popcorn and pretzels.  The items listed above may not be a complete list of foods and beverages to avoid. Contact your dietitian for more information.  WHERE CAN I FIND MORE INFORMATION?  National Heart, Lung, and [employer] Institute: www.nhlbi.nih.gov/health/health-topics/topics/dash/     This information is not intended to replace advice given to you by your health care provider. Make sure you discuss any questions you have with your health care provider.     Document Released: 03/10/2011 Document Revised: 04/11/2014 Document Reviewed: 01/23/2013  Elsevier Interactive Patient Education ©2016 [employer]vier Inc[employer]

## 2015-04-20 NOTE — ED Provider Notes (Signed)
CSN: [id number]     Arrival date & time 04/19/15  2254 History  By signing my name below, I, [name], attest that this documentation has been prepared under the direction and in the presence of Lanita Stammen, MD. Electronically Signed: Budd PalmerVanessa Small, ED Scribe. 04/20/2015. 1:35 AM.    Chief Complaint  Patient presents with  . Headache  . Dizziness   Patient is a 55 y.o. male presenting with headaches and dizziness. The history is provided by the patient. No language interpreter was used.  Headache Pain location:  Frontal Quality:  Dull Onset quality:  Gradual Duration:  3 days Timing:  Constant Progression:  Waxing and waning Relieved by:  Nothing Ineffective treatments:  Prescription medications Associated symptoms: dizziness and myalgias   Dizziness:    Severity:  Mild   Duration:  3 days   Timing:  Intermittent   Progression:  Waxing and waning Myalgias:    Location:  Generalized   Quality:  Aching   Severity:  Mild   Duration:  3 days Dizziness Associated symptoms: headaches    HPI Comments: Shawn Leydenugene Sigal is a 55 y.o. male with a PMHx of HTN and high cholesterol who presents to the Emergency Department complaining of waxing and waning frontal headache and dizziness onset 3 days ago. He reports associated generalized myalgias. He notes he has taken oxycodone with mild relief. He notes he has been off of his blood pressure medication for the past 2 months due to having lost his medicaid. He notes he has to wait to sign up again for health care in about 3 months. He reports his blood pressure ran high even while on medication.  Past Medical History  Diagnosis Date  . High cholesterol   . Heart murmur     "leaky valve"  . Angina   . Exertional dyspnea   . Sleep apnea 08/2010    "not required to wear mask"  . Gout     "sometimes flares up even w/Allupurinol"  . Hypertension   . GERD (gastroesophageal reflux disease)    Past Surgical History  Procedure Laterality  Date  . Upper gastrointestinal endoscopy  03/2011  . Colonoscopy  03/2011  . Knee arthroscopy  2004    "right; w/ligament repair in kneecap"  . Multiple tooth extractions  06/2010    full mouth  . Tonsillectomy      "I was a kid"   Family History  Problem Relation Age of Onset  . Hypertension     Social History  Substance Use Topics  . Smoking status: Never Smoker   . Smokeless tobacco: Never Used  . Alcohol Use: No    Review of Systems  Musculoskeletal: Positive for myalgias.  Neurological: Positive for dizziness and headaches.  All other systems reviewed and are negative.   Allergies  Adhesive and Latex  Home Medications   Prior to Admission medications   Medication Sig Start Date End Date Taking? Authorizing Provider  amLODipine (NORVASC) 10 MG tablet Take 1 tablet (10 mg total) by mouth daily. 02/02/15   Mancel BaleElliott Wentz, MD  hydrochlorothiazide (HYDRODIURIL) 25 MG tablet Take 1 tablet (25 mg total) by mouth daily. 02/02/15   Mancel BaleElliott Wentz, MD  lisinopril (PRINIVIL,ZESTRIL) 20 MG tablet Take 1 tablet (20 mg total) by mouth once. 02/01/15   Mancel BaleElliott Wentz, MD   BP 195/124 mmHg  Pulse 76  Temp(Src) 98.5 F (36.9 C) (Oral)  Resp 17  Ht 5' 8.5" (1.74 m)  Wt 201 lb 9 oz (  91.428 kg)  BMI 30.20 kg/m2  SpO2 99% Physical Exam  Constitutional: He is oriented to person, place, and time. He appears well-developed and well-nourished.  HENT:  Head: Normocephalic and atraumatic.  Eyes: Conjunctivae and EOM are normal. Pupils are equal, round, and reactive to light. Right eye exhibits no discharge. Left eye exhibits no discharge. No scleral icterus.  Cardiovascular: Normal rate, regular rhythm and normal heart sounds.  Exam reveals no gallop and no friction rub.   No murmur heard. Pulmonary/Chest: Effort normal and breath sounds normal. No respiratory distress. He has no wheezes. He has no rales.  Abdominal: Soft. Bowel sounds are normal. There is no tenderness.  Neurological:  He is alert and oriented to person, place, and time. He has normal reflexes. No cranial nerve deficit. He exhibits normal muscle tone. Coordination normal.  5/5 DTR's lower and upper  Skin: Skin is warm and dry. No rash noted. He is not diaphoretic. No erythema.  Psychiatric: He has a normal mood and affect.  Nursing note and vitals reviewed.   ED Course  Procedures  DIAGNOSTIC STUDIES: Oxygen Saturation is 100% on RA, normal by my interpretation.    COORDINATION OF CARE: 12:24 AM - Discussed plans to wait on diagnostic studies. Pt advised of plan for treatment and pt agrees.  Labs Review Labs Reviewed  CBC - Abnormal; Notable for the following:    RBC 5.89 (*)    MCV 71.8 (*)    MCH 24.1 (*)    All other components within normal limits  COMPREHENSIVE METABOLIC PANEL - Abnormal; Notable for the following:    Potassium 3.3 (*)    Glucose, Bld 120 (*)    All other components within normal limits  URINALYSIS, ROUTINE W REFLEX MICROSCOPIC (NOT AT [facility]) - Abnormal; Notable for the following:    Protein, ur >300 (*)    All other components within normal limits  URINE MICROSCOPIC-ADD ON - Abnormal; Notable for the following:    Squamous Epithelial / LPF 0-5 (*)    Bacteria, UA RARE (*)    All other components within normal limits  PROTIME-INR  APTT  DIFFERENTIAL  Rosezena Sensor, ED    Imaging Review Dg Chest 2 View  04/20/2015  CLINICAL DATA:  Acute onset of generalized chest pain and headache. Initial encounter. EXAM: CHEST  2 VIEW COMPARISON:  Chest radiograph from 02/01/2015 FINDINGS: The lungs are well-aerated. Mild vascular congestion is noted. There is no evidence of focal opacification, pleural effusion or pneumothorax. The heart is normal in size; the mediastinal contour is within normal limits. No acute osseous abnormalities are seen. IMPRESSION: Mild vascular congestion noted.  Lungs remain grossly clear. Electronically Signed   By: Roanna Raider M.D.   On: 04/20/2015  01:46   Ct Head Wo Contrast  04/20/2015  CLINICAL DATA:  Acute onset of headache and dizziness. Generalized weakness. Nausea and vomiting. Initial encounter. EXAM: CT HEAD WITHOUT CONTRAST TECHNIQUE: Contiguous axial images were obtained from the base of the skull through the vertex without intravenous contrast. COMPARISON:  CT of the head performed 01/01/2010 FINDINGS: There is no evidence of acute infarction, mass lesion, or intra- or extra-axial hemorrhage on CT. Scattered periventricular white matter change likely reflects small vessel ischemic microangiopathy. The posterior fossa, including the cerebellum, brainstem and fourth ventricle, is within normal limits. The third and lateral ventricles, and basal ganglia are unremarkable in appearance. The cerebral hemispheres are symmetric in appearance, with normal gray-white differentiation. No mass effect or midline shift is  seen. There is no evidence of fracture; visualized osseous structures are unremarkable in appearance. The orbits are within normal limits. The paranasal sinuses and mastoid air cells are well-aerated. There is partial opacification of the ethmoid air cells. No significant soft tissue abnormalities are seen. IMPRESSION: 1. No acute intracranial pathology seen on CT. 2. Scattered small vessel ischemic microangiopathy. Electronically Signed   By: Roanna Raider M.D.   On: 04/20/2015 01:37   I have personally reviewed and evaluated these images and lab results as part of my medical decision-making.   EKG Interpretation   Date/Time:  Sunday April 19 2015 23:03:36 EST Ventricular Rate:  83 PR Interval:  162 QRS Duration: 90 QT Interval:  442 QTC Calculation: 519 R Axis:   14 Text Interpretation:  Normal sinus rhythm Nonspecific ST abnormality  Prolonged QT Confirmed by [facility]  MD, Rilee Knoll ([id number]) on 04/20/2015  12:09:03 AM      MDM   Final diagnoses:  None    Medications  hydrochlorothiazide (HYDRODIURIL) tablet 25  mg (25 mg Oral Given 04/20/15 0242)  hydrochlorothiazide (HYDRODIURIL) tablet 25 mg (not administered)  divalproex (DEPAKOTE) DR tablet 500 mg (500 mg Oral Given 04/20/15 0242)  amLODipine (NORVASC) tablet 5 mg (5 mg Oral Given 04/20/15 0025)  metoprolol tartrate (LOPRESSOR) tablet 25 mg (25 mg Oral Given 04/20/15 0025)  ketorolac (TORADOL) injection 60 mg (60 mg Intramuscular Given 04/20/15 0243)  metoCLOPramide (REGLAN) tablet 10 mg (10 mg Oral Given 04/20/15 0242)   Sleeping the entirety of the time.  Do not take narcotics for headache.  Restart your medication and follow up with your PMD.  Return for weakness numbness changes in vision or speech sudden onset headache, vomiting chest pain or shortness of breath adhere to a low sodium diet.   I personally performed the services described in this documentation, which was scribed in my presence. The recorded information has been reviewed and is accurate.     Cy Blamer, MD 04/20/15 [phone]

## 2015-04-22 ENCOUNTER — Encounter (HOSPITAL_COMMUNITY): Payer: Self-pay

## 2015-04-22 ENCOUNTER — Observation Stay (HOSPITAL_COMMUNITY)
Admission: EM | Admit: 2015-04-22 | Discharge: 2015-04-23 | Disposition: A | Discharge status: Home / Self Care | Payer: Self-pay | Attending: Family Medicine | Admitting: Family Medicine

## 2015-04-22 DIAGNOSIS — Z9114 Patient's other noncompliance with medication regimen: Secondary | ICD-10-CM | POA: Insufficient documentation

## 2015-04-22 DIAGNOSIS — R519 Headache, unspecified: Secondary | ICD-10-CM

## 2015-04-22 DIAGNOSIS — K219 Gastro-esophageal reflux disease without esophagitis: Secondary | ICD-10-CM | POA: Diagnosis present

## 2015-04-22 DIAGNOSIS — R202 Paresthesia of skin: Secondary | ICD-10-CM

## 2015-04-22 DIAGNOSIS — M109 Gout, unspecified: Secondary | ICD-10-CM | POA: Insufficient documentation

## 2015-04-22 DIAGNOSIS — I4581 Long QT syndrome: Secondary | ICD-10-CM | POA: Insufficient documentation

## 2015-04-22 DIAGNOSIS — K210 Gastro-esophageal reflux disease with esophagitis: Secondary | ICD-10-CM

## 2015-04-22 DIAGNOSIS — I161 Hypertensive emergency: Secondary | ICD-10-CM

## 2015-04-22 DIAGNOSIS — E7800 Pure hypercholesterolemia, unspecified: Secondary | ICD-10-CM | POA: Insufficient documentation

## 2015-04-22 DIAGNOSIS — E876 Hypokalemia: Secondary | ICD-10-CM

## 2015-04-22 DIAGNOSIS — Z79899 Other long term (current) drug therapy: Secondary | ICD-10-CM | POA: Insufficient documentation

## 2015-04-22 DIAGNOSIS — E785 Hyperlipidemia, unspecified: Secondary | ICD-10-CM | POA: Insufficient documentation

## 2015-04-22 DIAGNOSIS — Z23 Encounter for immunization: Secondary | ICD-10-CM | POA: Insufficient documentation

## 2015-04-22 DIAGNOSIS — R9431 Abnormal electrocardiogram [ECG] [EKG]: Secondary | ICD-10-CM

## 2015-04-22 DIAGNOSIS — G4733 Obstructive sleep apnea (adult) (pediatric): Secondary | ICD-10-CM | POA: Insufficient documentation

## 2015-04-22 DIAGNOSIS — R42 Dizziness and giddiness: Secondary | ICD-10-CM | POA: Insufficient documentation

## 2015-04-22 DIAGNOSIS — Z791 Long term (current) use of non-steroidal anti-inflammatories (NSAID): Secondary | ICD-10-CM | POA: Insufficient documentation

## 2015-04-22 DIAGNOSIS — R51 Headache: Secondary | ICD-10-CM | POA: Insufficient documentation

## 2015-04-22 DIAGNOSIS — Z79891 Long term (current) use of opiate analgesic: Secondary | ICD-10-CM | POA: Insufficient documentation

## 2015-04-22 DIAGNOSIS — I160 Hypertensive urgency: Principal | ICD-10-CM | POA: Diagnosis present

## 2015-04-22 LAB — DIFFERENTIAL
Basophils Absolute: 0 10*3/uL (ref 0.0–0.1)
Basophils Relative: 0 %
Eosinophils Absolute: 0.1 10*3/uL (ref 0.0–0.7)
Eosinophils Relative: 2 %
LYMPHS PCT: 42 %
Lymphs Abs: 2.2 10*3/uL (ref 0.7–4.0)
MONO ABS: 0.4 10*3/uL (ref 0.1–1.0)
MONOS PCT: 8 %
NEUTROS ABS: 2.5 10*3/uL (ref 1.7–7.7)
Neutrophils Relative %: 48 %

## 2015-04-22 LAB — CBC
HEMATOCRIT: 41.5 % (ref 39.0–52.0)
HEMOGLOBIN: 14 g/dL (ref 13.0–17.0)
MCH: 24.2 pg — ABNORMAL LOW (ref 26.0–34.0)
MCHC: 33.7 g/dL (ref 30.0–36.0)
MCV: 71.7 fL — AB (ref 78.0–100.0)
Platelets: 166 10*3/uL (ref 150–400)
RBC: 5.79 MIL/uL (ref 4.22–5.81)
RDW: 14 % (ref 11.5–15.5)
WBC: 5.2 10*3/uL (ref 4.0–10.5)

## 2015-04-22 LAB — I-STAT CHEM 8, ED
BUN: 20 mg/dL (ref 6–20)
CALCIUM ION: 1.25 mmol/L — AB (ref 1.12–1.23)
CHLORIDE: 104 mmol/L (ref 101–111)
CREATININE: 1 mg/dL (ref 0.61–1.24)
GLUCOSE: 119 mg/dL — AB (ref 65–99)
HCT: 49 % (ref 39.0–52.0)
Hemoglobin: 16.7 g/dL (ref 13.0–17.0)
Potassium: 3.3 mmol/L — ABNORMAL LOW (ref 3.5–5.1)
Sodium: 143 mmol/L (ref 135–145)
TCO2: 26 mmol/L (ref 0–100)

## 2015-04-22 LAB — COMPREHENSIVE METABOLIC PANEL
ALK PHOS: 57 U/L (ref 38–126)
ALT: 31 U/L (ref 17–63)
AST: 29 U/L (ref 15–41)
Albumin: 3.5 g/dL (ref 3.5–5.0)
Anion gap: 12 (ref 5–15)
BUN: 18 mg/dL (ref 6–20)
CALCIUM: 10.1 mg/dL (ref 8.9–10.3)
CO2: 25 mmol/L (ref 22–32)
CREATININE: 1.03 mg/dL (ref 0.61–1.24)
Chloride: 105 mmol/L (ref 101–111)
Glucose, Bld: 120 mg/dL — ABNORMAL HIGH (ref 65–99)
Potassium: 3.4 mmol/L — ABNORMAL LOW (ref 3.5–5.1)
SODIUM: 142 mmol/L (ref 135–145)
Total Bilirubin: 0.5 mg/dL (ref 0.3–1.2)
Total Protein: 6.8 g/dL (ref 6.5–8.1)

## 2015-04-22 LAB — RAPID URINE DRUG SCREEN, HOSP PERFORMED
Amphetamines: NOT DETECTED
Barbiturates: POSITIVE — AB
Benzodiazepines: NOT DETECTED
COCAINE: NOT DETECTED
OPIATES: NOT DETECTED
TETRAHYDROCANNABINOL: NOT DETECTED

## 2015-04-22 LAB — I-STAT TROPONIN, ED: Troponin i, poc: 0.02 ng/mL (ref 0.00–0.08)

## 2015-04-22 LAB — APTT: aPTT: 30 seconds (ref 24–37)

## 2015-04-22 LAB — PROTIME-INR
INR: 1.07 (ref 0.00–1.49)
Prothrombin Time: 14.1 seconds (ref 11.6–15.2)

## 2015-04-22 MED ORDER — DIPHENHYDRAMINE HCL 50 MG/ML IJ SOLN
25.0000 mg | Freq: Once | INTRAMUSCULAR | Status: AC
  Administered 2015-04-22: 25 mg via INTRAVENOUS
  Filled 2015-04-22: qty 1

## 2015-04-22 MED ORDER — PANTOPRAZOLE SODIUM 40 MG PO TBEC
40.0000 mg | DELAYED_RELEASE_TABLET | Freq: Two times a day (BID) | ORAL | Status: DC
  Administered 2015-04-23: 40 mg via ORAL
  Filled 2015-04-22: qty 1

## 2015-04-22 MED ORDER — POTASSIUM CHLORIDE CRYS ER 20 MEQ PO TBCR
20.0000 meq | EXTENDED_RELEASE_TABLET | Freq: Two times a day (BID) | ORAL | Status: AC
  Administered 2015-04-22 (×2): 20 meq via ORAL
  Filled 2015-04-22 (×2): qty 1

## 2015-04-22 MED ORDER — NICARDIPINE HCL IN NACL 20-0.86 MG/200ML-% IV SOLN: 3.0000 mg/h | Freq: Once | INTRAVENOUS | Status: DC

## 2015-04-22 MED ORDER — AMLODIPINE BESYLATE 10 MG PO TABS
10.0000 mg | ORAL_TABLET | Freq: Every day | ORAL | Status: DC
  Administered 2015-04-23: 10 mg via ORAL
  Filled 2015-04-22: qty 1

## 2015-04-22 MED ORDER — POTASSIUM CHLORIDE CRYS ER 20 MEQ PO TBCR
20.0000 meq | EXTENDED_RELEASE_TABLET | Freq: Every day | ORAL | Status: DC
  Administered 2015-04-23: 20 meq via ORAL
  Filled 2015-04-22: qty 1

## 2015-04-22 MED ORDER — ONDANSETRON HCL 4 MG PO TABS
4.0000 mg | ORAL_TABLET | Freq: Four times a day (QID) | ORAL | Status: DC | PRN
Start: 2015-04-22 — End: 2015-04-23

## 2015-04-22 MED ORDER — ENOXAPARIN SODIUM 40 MG/0.4ML ~~LOC~~ SOLN
40.0000 mg | SUBCUTANEOUS | Status: DC
  Administered 2015-04-22 – 2015-04-23 (×2): 40 mg via SUBCUTANEOUS
  Filled 2015-04-22 (×2): qty 0.4

## 2015-04-22 MED ORDER — HYDRALAZINE HCL 20 MG/ML IJ SOLN
10.0000 mg | INTRAMUSCULAR | Status: DC | PRN
  Administered 2015-04-22 – 2015-04-23 (×2): 10 mg via INTRAVENOUS
  Filled 2015-04-22 (×2): qty 1

## 2015-04-22 MED ORDER — INFLUENZA VAC SPLIT QUAD 0.5 ML IM SUSY
0.5000 mL | PREFILLED_SYRINGE | INTRAMUSCULAR | Status: AC
  Administered 2015-04-23: 0.5 mL via INTRAMUSCULAR
  Filled 2015-04-22: qty 0.5

## 2015-04-22 MED ORDER — HYDROCHLOROTHIAZIDE 25 MG PO TABS
25.0000 mg | ORAL_TABLET | Freq: Every day | ORAL | Status: DC
  Administered 2015-04-22: 25 mg via ORAL
  Filled 2015-04-22 (×2): qty 1

## 2015-04-22 MED ORDER — AMLODIPINE BESYLATE 5 MG PO TABS
10.0000 mg | ORAL_TABLET | Freq: Once | ORAL | Status: AC
  Administered 2015-04-22: 10 mg via ORAL
  Filled 2015-04-22: qty 2

## 2015-04-22 MED ORDER — OXYCODONE HCL 5 MG PO TABS
5.0000 mg | ORAL_TABLET | ORAL | Status: DC | PRN
  Administered 2015-04-22 – 2015-04-23 (×2): 5 mg via ORAL
  Filled 2015-04-22 (×2): qty 1

## 2015-04-22 MED ORDER — SODIUM CHLORIDE 0.9 % IV BOLUS (SEPSIS)
1000.0000 mL | Freq: Once | INTRAVENOUS | Status: AC
  Administered 2015-04-22: 1000 mL via INTRAVENOUS

## 2015-04-22 MED ORDER — ONDANSETRON HCL 4 MG/2ML IJ SOLN: 4.0000 mg | Freq: Four times a day (QID) | INTRAMUSCULAR | Status: DC | PRN

## 2015-04-22 MED ORDER — LISINOPRIL 20 MG PO TABS
20.0000 mg | ORAL_TABLET | Freq: Once | ORAL | Status: AC
Start: 2015-04-23 — End: 2015-04-23
  Administered 2015-04-23: 20 mg via ORAL
  Filled 2015-04-22: qty 1

## 2015-04-22 MED ORDER — ACETAMINOPHEN 325 MG PO TABS
650.0000 mg | ORAL_TABLET | Freq: Four times a day (QID) | ORAL | Status: DC | PRN
  Administered 2015-04-23: 650 mg via ORAL
  Filled 2015-04-22: qty 2

## 2015-04-22 MED ORDER — PANTOPRAZOLE SODIUM 40 MG PO TBEC
40.0000 mg | DELAYED_RELEASE_TABLET | Freq: Every day | ORAL | Status: DC
  Administered 2015-04-22: 40 mg via ORAL
  Filled 2015-04-22: qty 1

## 2015-04-22 MED ORDER — MAGNESIUM SULFATE IN D5W 10-5 MG/ML-% IV SOLN
1.0000 g | Freq: Once | INTRAVENOUS | Status: AC
  Administered 2015-04-22: 1 g via INTRAVENOUS
  Filled 2015-04-22: qty 100

## 2015-04-22 MED ORDER — LISINOPRIL 20 MG PO TABS
20.0000 mg | ORAL_TABLET | Freq: Once | ORAL | Status: AC
  Administered 2015-04-22: 20 mg via ORAL
  Filled 2015-04-22: qty 1

## 2015-04-22 MED ORDER — HYDROCHLOROTHIAZIDE 25 MG PO TABS
25.0000 mg | ORAL_TABLET | Freq: Every day | ORAL | Status: DC
  Administered 2015-04-23: 25 mg via ORAL
  Filled 2015-04-22: qty 1

## 2015-04-22 MED ORDER — PROCHLORPERAZINE EDISYLATE 5 MG/ML IJ SOLN
10.0000 mg | Freq: Four times a day (QID) | INTRAMUSCULAR | Status: DC | PRN
  Administered 2015-04-22: 10 mg via INTRAVENOUS
  Filled 2015-04-22 (×2): qty 2

## 2015-04-22 MED ORDER — ACETAMINOPHEN 650 MG RE SUPP
650.0000 mg | Freq: Four times a day (QID) | RECTAL | Status: DC | PRN
Start: 2015-04-22 — End: 2015-04-23

## 2015-04-22 MED ORDER — HYDRALAZINE HCL 20 MG/ML IJ SOLN
5.0000 mg | Freq: Once | INTRAMUSCULAR | Status: AC
  Administered 2015-04-22: 5 mg via INTRAVENOUS
  Filled 2015-04-22: qty 1

## 2015-04-22 NOTE — ED Notes (Signed)
Pt came in on the 15 th for the same symptoms of high BP, feeling dizzy and still having same symptoms as he did when here on the 15th. Stayed home on Monday. Has been taking the medication that he was told to take.

## 2015-04-22 NOTE — ED Notes (Signed)
Reported bp 169/123 to Dr. Wilkie Aye. MD acknowledges, no new orders.

## 2015-04-22 NOTE — ED Notes (Signed)
Spoke with Dr. Wilkie Aye, no drip needed for bp management at this time.

## 2015-04-22 NOTE — ED Provider Notes (Signed)
CSN: [id number]     Arrival date & time 04/22/15  0211 History  By signing my name below, I, Freida Busman, attest that this documentation has been prepared under the direction and in the presence of Shon Baton, MD . Electronically Signed: Freida Busman, Scribe. 04/22/2015. 3:57 AM.   Chief Complaint  Patient presents with  . Hypertension  . Dizziness    The history is provided by the patient. No language interpreter was used.     HPI Comments:  [name] is a 55 y.o. male with a history of HTN, angina, and HLD, who presents to the Emergency Department complaining of HA with 8/10 pain since ~ 1000 yesterday AM. He reports associated room spinning dizziness. He also notes mild SOB. Pt denies CP. He was seen in the ED for same on 04/19/15. He notes pain improved upon discharge but did not fully resolve. Pt had been non-complaint with BP meds x ~ 3 months. He was discharged with amlodipine 2 days ago, which he has been compliant with. Denies any weakness, numbness, tingling.  Seen 2 days ago treated with migraine cocktail and discharged with blood pressure medications. At that time he had a negative CT scan of the head.  Past Medical History  Diagnosis Date  . High cholesterol   . Heart murmur     "leaky valve"  . Angina   . Exertional dyspnea   . Sleep apnea 08/2010    "not required to wear mask"  . Gout     "sometimes flares up even w/Allupurinol"  . Hypertension   . GERD (gastroesophageal reflux disease)    Past Surgical History  Procedure Laterality Date  . Upper gastrointestinal endoscopy  03/2011  . Colonoscopy  03/2011  . Knee arthroscopy  2004    "right; w/ligament repair in kneecap"  . Multiple tooth extractions  06/2010    full mouth  . Tonsillectomy      "I was a kid"   Family History  Problem Relation Age of Onset  . Hypertension     Social History  Substance Use Topics  . Smoking status: Never Smoker   . Smokeless tobacco: Never Used  . Alcohol Use: No     Review of Systems  Constitutional: Negative for fever.  Respiratory: Positive for shortness of breath.   Cardiovascular: Negative for chest pain.  Gastrointestinal: Negative for vomiting and abdominal pain.  Musculoskeletal: Negative for neck pain.  Neurological: Positive for dizziness, speech difficulty and headaches.  All other systems reviewed and are negative.   Allergies  Adhesive and Latex  Home Medications   Prior to Admission medications   Medication Sig Start Date End Date Taking? Authorizing Provider  amLODipine (NORVASC) 10 MG tablet Take 1 tablet (10 mg total) by mouth daily. Patient not taking: Reported on 04/20/2015 02/02/15   Mancel Bale, MD  amLODipine (NORVASC) 10 MG tablet Take 1 tablet (10 mg total) by mouth daily. 04/20/15   April Palumbo, MD  hydrochlorothiazide (HYDRODIURIL) 25 MG tablet Take 1 tablet (25 mg total) by mouth daily. Patient not taking: Reported on 04/20/2015 02/02/15   Mancel Bale, MD  hydrochlorothiazide (HYDRODIURIL) 25 MG tablet Take 1 tablet (25 mg total) by mouth daily. 04/20/15   April Palumbo, MD  ibuprofen (ADVIL,MOTRIN) 200 MG tablet Take 200 mg by mouth every 6 (six) hours as needed for moderate pain.    Historical Provider, MD  lisinopril (PRINIVIL,ZESTRIL) 20 MG tablet Take 1 tablet (20 mg total) by mouth once. Patient  not taking: Reported on 04/20/2015 02/01/15   Mancel Bale, MD  oxycodone (OXY-IR) 5 MG capsule Take 5 mg by mouth every 4 (four) hours as needed for pain.    Historical Provider, MD   BP 158/127 mmHg  Pulse 74  Temp(Src) 98.3 F (36.8 C) (Oral)  Resp 12  Ht  (1.753 m)  Wt 201 lb 14.4 oz (91.581 kg)  BMI 29.80 kg/m2  SpO2 98% Physical Exam  Constitutional: He is oriented to person, place, and time. He appears well-developed and well-nourished.  Eyes closed during most of history taking  HENT:  Head: Normocephalic and atraumatic.  Mouth/Throat: Oropharynx is clear and moist.  Eyes: Pupils are equal,  round, and reactive to light.  Pupils 4 mm reactive bilaterally  Neck: Normal range of motion. Neck supple.  Cardiovascular: Normal rate, regular rhythm and normal heart sounds.   No murmur heard. Pulmonary/Chest: Effort normal and breath sounds normal. No respiratory distress. He has no wheezes.  Abdominal: Soft. Bowel sounds are normal. There is no tenderness. There is no rebound.  Musculoskeletal: He exhibits no edema.  Neurological: He is alert and oriented to person, place, and time.  Cranial nerves II through XII intact, 5 out of 5 strength in all 4 extremities, no dysmetria to finger-nose-finger  Skin: Skin is warm and dry.  Psychiatric: He has a normal mood and affect.  Nursing note and vitals reviewed.   ED Course  Procedures   DIAGNOSTIC STUDIES:  Oxygen Saturation is 94% on RA, adequate by my interpretation.    COORDINATION OF CARE:  3:36 AM Discussed treatment plan with pt at bedside and pt agreed to plan.  Labs Review Labs Reviewed  CBC - Abnormal; Notable for the following:    MCV 71.7 (*)    MCH 24.2 (*)    All other components within normal limits  COMPREHENSIVE METABOLIC PANEL - Abnormal; Notable for the following:    Potassium 3.4 (*)    Glucose, Bld 120 (*)    All other components within normal limits  I-STAT CHEM 8, ED - Abnormal; Notable for the following:    Potassium 3.3 (*)    Glucose, Bld 119 (*)    Calcium, Ion 1.25 (*)    All other components within normal limits  PROTIME-INR  APTT  DIFFERENTIAL  I-STAT TROPOININ, ED    Imaging Review No results found. I have personally reviewed and evaluated these images and lab results as part of my medical decision-making.   EKG Interpretation   Date/Time:  Wednesday April 22 2015 02:29:10 EST Ventricular Rate:  78 PR Interval:  158 QRS Duration: 90 QT Interval:  440 QTC Calculation: 501 R Axis:   96 Text Interpretation:  Normal sinus rhythm Biatrial enlargement Rightward  axis T wave  abnormality, consider inferior ischemia Prolonged QT Abnormal  ECG No significant change since last tracing Confirmed by POLLINA  MD,  CHRISTOPHER ([id number]) on 04/22/2015 2:29:18 AM      MDM   Final diagnoses:  Hypertensive urgency  Nonintractable headache, unspecified chronicity pattern, unspecified headache type    Patient presents with headache and persistent hypertension. Nonfocal on exam. Initial blood pressure of 185/130. Reports compliance with HCTZ and amlodipine. Normal CT 2 days ago. Suspect patient's blood pressure may be contributing to headache. Basic labwork obtained. Patient given migraine cocktail and magnesium as well as 1 dose of IV hydralazine. Blood pressure improved to the 150s over 100s. He reports some improvement of headache with treatment; however, blood pressure  noted to increase again to the 180s over 120s with worsening headache. Patient given a by mouth dose of amlodipine, HCTZ, and lisinopril. Suspect hypertensive urgency. We'll admit for further management. Discussed with hospitalist.  I personally performed the services described in this documentation, which was scribed in my presence. The recorded information has been reviewed and is accurate.   Shon Baton, MD 04/22/15 [phone]

## 2015-04-22 NOTE — H&P (Signed)
[facility] History and Physical  [name] ZOX:[id number] DOB: 01-19-1961 DOA: 04/22/2015  Referring physician: Emergency Department PCP: Dorrene German, MD   CHIEF COMPLAINT:   Headache, numbness of right face, elevated blood pressure      HPI: [name] is a 55 y.o. male with uncontrolled hypertension. Patient presented to the emergency department 3 days ago with headache and dizziness. He has been off some of his home blood pressure medications for 3 months. Patient was discharged from the emergency department with refills on both Norvasc and Hhydrodiuril.. Patient states he did get these medications filled upon discharge.  Patient returned to the emergency department yesterday for ongoing headache, nausea. He had some numbness of his right face which has since resolved. He had some mild chest discomfort and shortness of breath which have also resolved.  Patient takes his blood pressure home every other day. Since Friday has diastolic blood pressure has been in the 120s. His headache started Friday, it is located across the forhead and behind both eyes. Headache is constant, unrelieved with oxycodone.    ED COURSE:            Labs:   Potassium 3.3, glucose 119, ionized calcium 1.25, hemoglobin 16.7,  troponin 0.02, WBC 5.2  Urinalysis:    Urinalysis at time of ED visit 2 days ago: Clear, rare bacteria, protein greater than 300, negative leukocytes, 6-30 wbc's             CXR:    04/20/15 Mild pulmonary vascular congestion        EKG:    Normal sinus rhythm Biatrial enlargement Rightward axis T wave abnormality, consider inferior ischemia Prolonged QT 440 Abnormal ECG No significant change since last tracing Confirmed by POLLINA MD, CHRISTOPHER [phone]) on 04/22/2015 2:29:18 AM                    Medications  prochlorperazine (COMPAZINE) injection 10 mg (10 mg Intravenous Given 04/22/15 0408)  hydrochlorothiazide (HYDRODIURIL) tablet 25 mg (not administered)    hydrALAZINE (APRESOLINE) injection 5 mg (5 mg Intravenous Given 04/22/15 0408)  diphenhydrAMINE (BENADRYL) injection 25 mg (25 mg Intravenous Given 04/22/15 0408)  sodium chloride 0.9 % bolus 1,000 mL (1,000 mLs Intravenous New Bag/Given 04/22/15 0408)  magnesium sulfate IVPB 1 g 100 mL (0 g Intravenous Stopped 04/22/15 0517)  amLODipine (NORVASC) tablet 10 mg (10 mg Oral Given 04/22/15 0606)  lisinopril (PRINIVIL,ZESTRIL) tablet 20 mg (20 mg Oral Given 04/22/15 0607)    Review of Systems  Constitutional: Negative.   Eyes: Negative.   Respiratory: Positive for shortness of breath.   Cardiovascular: Positive for chest pain.  Gastrointestinal: Positive for heartburn.  Genitourinary: Negative.   Musculoskeletal: Negative.   Skin: Negative.   Neurological: Positive for dizziness, tingling and headaches.  Endo/Heme/Allergies: Negative.   Psychiatric/Behavioral: Negative.     Past Medical History  Diagnosis Date  . High cholesterol   . Heart murmur     "leaky valve"  . Angina   . Exertional dyspnea   . Sleep apnea 08/2010    "not required to wear mask"  . Gout     "sometimes flares up even w/Allupurinol"  . Hypertension   . GERD (gastroesophageal reflux disease)    Past Surgical History  Procedure Laterality Date  . Upper gastrointestinal endoscopy  03/2011  . Colonoscopy  03/2011  . Knee arthroscopy  2004    "right; w/ligament repair in kneecap"  . Multiple tooth extractions  06/2010  full mouth  . Tonsillectomy      "I was a kid"    SOCIAL HISTORY:  reports that he has never smoked. He has never used smokeless tobacco. He reports that he uses illicit drugs (Marijuana). He reports that he does not drink alcohol. Lives: at home with   Assistive devices:   None needed for ambulation.   Allergies  Allergen Reactions  . Adhesive [Tape] Other (See Comments)    Makes the skin feel as if it is burning, will also bruise the skin.  . Latex Hives and Itching    Burns skin     Family History  Problem Relation Age of Onset  . Hypertension      Prior to Admission medications   Medication Sig Start Date End Date Taking? Authorizing Provider  amLODipine (NORVASC) 10 MG tablet Take 1 tablet (10 mg total) by mouth daily. Patient not taking: Reported on 04/20/2015 02/02/15   Mancel Bale, MD  amLODipine (NORVASC) 10 MG tablet Take 1 tablet (10 mg total) by mouth daily. 04/20/15   April Palumbo, MD  hydrochlorothiazide (HYDRODIURIL) 25 MG tablet Take 1 tablet (25 mg total) by mouth daily. Patient not taking: Reported on 04/20/2015 02/02/15   Mancel Bale, MD  hydrochlorothiazide (HYDRODIURIL) 25 MG tablet Take 1 tablet (25 mg total) by mouth daily. 04/20/15   April Palumbo, MD  ibuprofen (ADVIL,MOTRIN) 200 MG tablet Take 200 mg by mouth every 6 (six) hours as needed for moderate pain.    Historical Provider, MD  lisinopril (PRINIVIL,ZESTRIL) 20 MG tablet Take 1 tablet (20 mg total) by mouth once. Patient not taking: Reported on 04/20/2015 02/01/15   Mancel Bale, MD  oxycodone (OXY-IR) 5 MG capsule Take 5 mg by mouth every 4 (four) hours as needed for pain.    Historical Provider, MD   PHYSICAL EXAM: Filed Vitals:   04/22/15 0607 04/22/15 0615 04/22/15 0630 04/22/15 0645  BP: 158/127 170/127 159/123 146/119  Pulse:  75 71 70  Temp:      TempSrc:      Resp:  13    Height:      Weight:      SpO2:  96% 97% 94%    Wt Readings from Last 3 Encounters:  04/22/15 91.581 kg (201 lb 14.4 oz)  04/19/15 91.428 kg (201 lb 9 oz)  02/01/15 95.709 kg (211 lb)    General:  Pleasant black male. Appears calm and comfortable Eyes: PER, normal lids, irises & conjunctiva ENT: grossly normal hearing, lips & tongue Neck: no LAD, no masses Cardiovascular: RRR, no murmurs. No LE edema.  Respiratory: Respirations even and unlabored. Normal respiratory effort. Lungs CTA bilaterally, no wheezes / rales .   Abdomen: soft, non-distended, non-tender, active bowel sounds. No obvious  masses.  Skin: no rash seen on limited exam Musculoskeletal: grossly normal tone BUE/BLE Psychiatric: grossly normal mood and affect, speech fluent and appropriate Neurologic: grossly non-focal.         LABS ON ADMISSION:    Basic Metabolic Panel:  Recent Labs Lab 04/19/15 2324 04/22/15 0223 04/22/15 0234  NA 139 142 143  K 3.3* 3.4* 3.3*  CL 101 105 104  CO2 26 25  --   GLUCOSE 120* 120* 119*  BUN CREATININE 0.94 1.03 1.00  CALCIUM 9.5 10.1  --    Liver Function Tests:  Recent Labs Lab 04/19/15 2324 04/22/15 0223  AST 29 29  ALT 30 31  ALKPHOS 66 57  BILITOT 0.5  0.5  PROT 7.1 6.8  ALBUMIN 3.5 3.5     CBC:  Recent Labs Lab 04/19/15 2324 04/22/15 0223 04/22/15 0234  WBC 5.9 5.2  --   NEUTROABS 2.7 2.5  --   HGB 14.2 14.0 16.7  HCT 42.3 41.5 49.0  MCV 71.8* 71.7*  --   PLT 173 166  --     CREATININE: 1 (04/22/15 0234) Estimated creatinine clearance - 93.4 mL/min  ASSESSMENT / PLAN   Hypertensive urgency, resolving. Associated headache, mild chest discomfort, mild shortness of breath, right facial paresthesia - all resolving. BP max in ED 185/130. Normal troponin -admit to Observation - telemetry -continue home BP meds - am dose given in ED today with improvement in BP -prn Hydralazine -Case Management consult - hasn't been able to get home medications  Hypokalemia, mild  -KCL BID today then daily (on diuretics and K+ low at ED visit 3 days ago) -am BMET -Mg+ given in ED so Mg+ level may not be accurate if checked  Prolonged Q-T interval on ECG - 440 -Replete K+ -monitor on telemetry -am EKG -avoid QT prolonging medications  GERD. Breakthrough pyrosis on daily Prilosec -increase PPI to BID before meals for next 2 weeks -Anti-reflux measures.        CONSULTANTS:  none   Code Status: full code  DVT Prophylaxis: Lovenox Family Communication:  Patient alert, oriented and understands plan of care.   Disposition Plan:  Discharge to home in 24-48 hours   Time spent: 60 minutes Willette Cluster  NP [facility] Pager [phone]

## 2015-04-23 DIAGNOSIS — I160 Hypertensive urgency: Principal | ICD-10-CM

## 2015-04-23 LAB — BASIC METABOLIC PANEL
ANION GAP: 7 (ref 5–15)
BUN: 12 mg/dL (ref 6–20)
CHLORIDE: 106 mmol/L (ref 101–111)
CO2: 26 mmol/L (ref 22–32)
Calcium: 9.4 mg/dL (ref 8.9–10.3)
Creatinine, Ser: 0.91 mg/dL (ref 0.61–1.24)
GFR calc Af Amer: >60 mL/min (ref >60)
GLUCOSE: 107 mg/dL — AB (ref 65–99)
POTASSIUM: 4.7 mmol/L (ref 3.5–5.1)
SODIUM: 139 mmol/L (ref 135–145)

## 2015-04-23 LAB — CBC
HCT: 42 % (ref 39.0–52.0)
HEMOGLOBIN: 14.3 g/dL (ref 13.0–17.0)
MCH: 23.8 pg — ABNORMAL LOW (ref 26.0–34.0)
MCHC: 34 g/dL (ref 30.0–36.0)
MCV: 70 fL — AB (ref 78.0–100.0)
PLATELETS: 162 10*3/uL (ref 150–400)
RBC: 6 MIL/uL — AB (ref 4.22–5.81)
RDW: 13.6 % (ref 11.5–15.5)
WBC: 5.2 10*3/uL (ref 4.0–10.5)

## 2015-04-23 LAB — GLUCOSE, CAPILLARY: GLUCOSE-CAPILLARY: 153 mg/dL — AB (ref 65–99)

## 2015-04-23 MED ORDER — HYDROCHLOROTHIAZIDE 25 MG PO TABS: 25.0000 mg | ORAL_TABLET | Freq: Every day | ORAL | Status: DC

## 2015-04-23 MED ORDER — HYDRALAZINE HCL 20 MG/ML IJ SOLN
10.0000 mg | Freq: Once | INTRAMUSCULAR | Status: AC
  Administered 2015-04-23: 10 mg via INTRAVENOUS
  Filled 2015-04-23: qty 1

## 2015-04-23 MED ORDER — AMLODIPINE BESYLATE 10 MG PO TABS: 10.0000 mg | ORAL_TABLET | Freq: Every day | ORAL | Status: DC

## 2015-04-23 MED ORDER — COLCHICINE 0.6 MG PO TABS: ORAL_TABLET | ORAL | Status: DC

## 2015-04-23 MED ORDER — LISINOPRIL 20 MG PO TABS
20.0000 mg | ORAL_TABLET | Freq: Once | ORAL | Status: DC
Start: 2015-04-23 — End: 2015-07-21

## 2015-04-23 NOTE — Discharge Summary (Signed)
[name], is a 55 y.o. male  DOB Apr 18, 1960  MRN [id number].  Admission date:  04/22/2015  Admitting Physician  Alberteen Sam, MD  Discharge Date:  04/23/2015   Primary MD  No PCP Per Patient  Recommendations for primary care physician for things to follow:  - Patient to follow with: Wellness clinic.   Admission Diagnosis  Hypertensive urgency [I16.0] Nonintractable headache, unspecified chronicity pattern, unspecified headache type [R51]   Discharge Diagnosis  Hypertensive urgency [I16.0] Nonintractable headache, unspecified chronicity pattern, unspecified headache type [R51]    Active Problems:   GERD (gastroesophageal reflux disease)   Hypertensive urgency   Headache   Paresthesia   Prolonged Q-T interval on ECG   Hypokalemia      Past Medical History  Diagnosis Date  . High cholesterol   . Heart murmur     "leaky valve"  . Angina   . Exertional dyspnea   . Gout     "sometimes flares up even w/Allupurinol"  . Hypertension   . GERD (gastroesophageal reflux disease)   . Sleep apnea 08/2010    "not required to wear mask"    Past Surgical History  Procedure Laterality Date  . Upper gastrointestinal endoscopy  03/2011  . Colonoscopy  03/2011  . Knee arthroscopy Right 2004    "w/ligament repair in kneecap"  . Multiple tooth extractions  06/2010    full mouth  . Tonsillectomy      "I was a kid"       History of present illness and  Hospital Course:     Kindly see H&P for history of present illness and admission details, please review complete Labs, Consult reports and Test reports for all details in brief  HPI  from the history and physical done on the day of admission 04/22/2015 [name] is a 55 y.o. male with uncontrolled hypertension. Patient presented to the emergency department 3 days ago with headache and dizziness. He has been off some of his home blood  pressure medications for 3 months. Patient was discharged from the emergency department with refills on both Norvasc and Hhydrodiuril.. Patient states he did get these medications filled upon discharge.  Patient returned to the emergency department yesterday for ongoing headache, nausea. He had some numbness of his right face which has since resolved. He had some mild chest discomfort and shortness of breath which have also resolved. Patient takes his blood pressure home every other day. Since Friday has diastolic blood pressure has been in the 120s. His headache started Friday, it is located across the forhead and behind both eyes. Headache is constant, unrelieved with oxycodone.    Hospital Course   Hypertensive urgency - This is secondary to noncompliance with medication, presents with headache, no chest discomfort, and dyspnea, resume back on his home medication, started on when necessary hydralazine, blood pressure has been acceptable all day today, most recent is 146/85, case management consulted, to arrange for follow-up with: Wellness clinic, and patient was giving Match letter. Discharged on amlodipine  10 mg daily, hydrochlorothiazide 25 mg daily, and lisinopril 20 mg daily. - As well patient given prescription for colchicine to use on an as-needed basis for gout.     Discharge Condition:  stable   Follow UP  Follow-up Information    Schedule an appointment as soon as possible for a visit with [facility].   Contact information:   [address] [place] [address] [phone]      Schedule an appointment as soon as possible for a visit with [facility].   Contact information:   [address] [place] [address] [phone]        Discharge Instructions  and  Discharge Medications     Discharge Instructions    Diet - low sodium heart healthy    Complete by:  As  directed      Discharge instructions    Complete by:  As directed   Follow with [facility]  Get CBC, CMP,  hecked  by Primary MD next visit.    Activity: As tolerated with Full fall precautions use walker/cane & assistance as needed   Disposition Home    Diet: Heart Healthy , low-salt with feeding assistance and aspiration precautions.  For Heart failure patients - Check your Weight same time everyday, if you gain over 2 pounds, or you develop in leg swelling, experience more shortness of breath or chest pain, call your Primary MD immediately. Follow Cardiac Low Salt Diet and 1.5 lit/day fluid restriction.   On your next visit with your primary care physician please Get Medicines reviewed and adjusted.   Please request your Prim.MD to go over all Hospital Tests and Procedure/Radiological results at the follow up, please get all Hospital records sent to your Prim MD by signing hospital release before you go home.   If you experience worsening of your admission symptoms, develop shortness of breath, life threatening emergency, suicidal or homicidal thoughts you must seek medical attention immediately by calling 911 or calling your MD immediately  if symptoms less severe.  You Must read complete instructions/literature along with all the possible adverse reactions/side effects for all the Medicines you take and that have been prescribed to you. Take any new Medicines after you have completely understood and accpet all the possible adverse reactions/side effects.   Do not drive, operating heavy machinery, perform activities at heights, swimming or participation in water activities or provide baby sitting services if your were admitted for syncope or siezures until you have seen by Primary MD or a Neurologist and advised to do so again.  Do not drive when taking Pain medications.    Do not take more than prescribed Pain, Sleep and Anxiety Medications  Special Instructions: If  you have smoked or chewed Tobacco  in the last 2 yrs please stop smoking, stop any regular Alcohol  and or any Recreational drug use.  Wear Seat belts while driving.   Please note  You were cared for by a hospitalist during your hospital stay. If you have any questions about your discharge medications or the care you received while you were in the hospital after you are discharged, you can call the unit and asked to speak with the hospitalist on call if the hospitalist that took care of you is not available. Once you are discharged, your primary care physician will handle any further medical issues. Please note that NO REFILLS for any discharge medications  will be authorized once you are discharged, as it is imperative that you return to your primary care physician (or establish a relationship with a primary care physician if you do not have one) for your aftercare needs so that they can reassess your need for medications and monitor your lab values.     Increase activity slowly    Complete by:  As directed             Medication List    STOP taking these medications        ibuprofen 200 MG tablet  Commonly known as:  ADVIL,MOTRIN      TAKE these medications        amLODipine 10 MG tablet  Commonly known as:  NORVASC  Take 1 tablet (10 mg total) by mouth daily.     colchicine 0.6 MG tablet  Please take once daily as needed for gout pain     hydrochlorothiazide 25 MG tablet  Commonly known as:  HYDRODIURIL  Take 1 tablet (25 mg total) by mouth daily.     lisinopril 20 MG tablet  Commonly known as:  PRINIVIL,ZESTRIL  Take 1 tablet (20 mg total) by mouth once.          Diet and Activity recommendation: See Discharge Instructions above   Consults obtained -  none   Major procedures and Radiology Reports - PLEASE review detailed and final reports for all details, in brief -      Dg Chest 2 View  04/20/2015  CLINICAL DATA:  Acute onset of generalized chest pain and  headache. Initial encounter. EXAM: CHEST  2 VIEW COMPARISON:  Chest radiograph from 02/01/2015 FINDINGS: The lungs are well-aerated. Mild vascular congestion is noted. There is no evidence of focal opacification, pleural effusion or pneumothorax. The heart is normal in size; the mediastinal contour is within normal limits. No acute osseous abnormalities are seen. IMPRESSION: Mild vascular congestion noted.  Lungs remain grossly clear. Electronically Signed   By: Roanna Raider M.D.   On: 04/20/2015 01:46   Ct Head Wo Contrast  04/20/2015  CLINICAL DATA:  Acute onset of headache and dizziness. Generalized weakness. Nausea and vomiting. Initial encounter. EXAM: CT HEAD WITHOUT CONTRAST TECHNIQUE: Contiguous axial images were obtained from the base of the skull through the vertex without intravenous contrast. COMPARISON:  CT of the head performed 01/01/2010 FINDINGS: There is no evidence of acute infarction, mass lesion, or intra- or extra-axial hemorrhage on CT. Scattered periventricular white matter change likely reflects small vessel ischemic microangiopathy. The posterior fossa, including the cerebellum, brainstem and fourth ventricle, is within normal limits. The third and lateral ventricles, and basal ganglia are unremarkable in appearance. The cerebral hemispheres are symmetric in appearance, with normal gray-white differentiation. No mass effect or midline shift is seen. There is no evidence of fracture; visualized osseous structures are unremarkable in appearance. The orbits are within normal limits. The paranasal sinuses and mastoid air cells are well-aerated. There is partial opacification of the ethmoid air cells. No significant soft tissue abnormalities are seen. IMPRESSION: 1. No acute intracranial pathology seen on CT. 2. Scattered small vessel ischemic microangiopathy. Electronically Signed   By: Roanna Raider M.D.   On: 04/20/2015 01:37    Micro Results     No results found for this or any  previous visit (from the past 240 hour(s)).     Today   Subjective:   Trenten Watchman today denies any further headache, chest discomfort or dyspnea , reports  mild left ankle pain .Marland Kitchen   Objective:   Blood pressure 146/85, pulse 94, temperature 98.5 F (36.9 C), temperature source Oral, resp. rate 20, height  (1.753 m), weight 91.581 kg (201 lb 14.4 oz), SpO2 95 %.   Intake/Output Summary (Last 24 hours) at 04/23/15 1205 Last data filed at 04/23/15 0603  Gross per 24 hour  Intake      0 ml  Output    975 ml  Net   -975 ml    Exam Awake Alert, Oriented x 3, No new F.N deficits, Normal affect [place].AT,PERRAL Supple Neck,No JVD, No cervical lymphadenopathy appriciated.  Symmetrical Chest wall movement, Good air movement bilaterally, CTAB RRR,No Gallops,Rubs or new Murmurs, No Parasternal Heave +ve B.Sounds, Abd Soft, Non tender, No organomegaly appriciated, No rebound -guarding or rigidity. No Cyanosis, Clubbing or edema, No new Rash or bruise  Data Review   CBC w Diff: Lab Results  Component Value Date   WBC 5.2 04/23/2015   HGB 14.3 04/23/2015   HCT 42.0 04/23/2015   PLT 162 04/23/2015   LYMPHOPCT 42 04/22/2015   MONOPCT 8 04/22/2015   EOSPCT 2 04/22/2015   BASOPCT 0 04/22/2015    CMP: Lab Results  Component Value Date   NA 139 04/23/2015   K 4.7 04/23/2015   CL 106 04/23/2015   CO2 26 04/23/2015   BUN 12 04/23/2015   CREATININE 0.91 04/23/2015   PROT 6.8 04/22/2015   ALBUMIN 3.5 04/22/2015   BILITOT 0.5 04/22/2015   ALKPHOS 57 04/22/2015   AST 29 04/22/2015   ALT 31 04/22/2015  .   Total Time in preparing paper work, data evaluation and todays exam - 25 minutes  ELGERGAWY, DAWOOD M.D on 04/23/2015 at 12:05 PM  [facility]   Office  [phone]

## 2015-04-23 NOTE — Progress Notes (Signed)
Pt a/o, c/o generalized pain, PRN oxycodone given as ordered, pts BP 149/102 PRN Hydralazine 10 mg IVP given as ordered, pt resting comfortably

## 2015-04-23 NOTE — Discharge Instructions (Signed)
Follow with [facility]  Get CBC, CMP,  hecked  by Primary MD next visit.    Activity: As tolerated with Full fall precautions use walker/cane & assistance as needed   Disposition Home    Diet: Heart Healthy , low-salt with feeding assistance and aspiration precautions.  For Heart failure patients - Check your Weight same time everyday, if you gain over 2 pounds, or you develop in leg swelling, experience more shortness of breath or chest pain, call your Primary MD immediately. Follow Cardiac Low Salt Diet and 1.5 lit/day fluid restriction.   On your next visit with your primary care physician please Get Medicines reviewed and adjusted.   Please request your Prim.MD to go over all Hospital Tests and Procedure/Radiological results at the follow up, please get all Hospital records sent to your Prim MD by signing hospital release before you go home.   If you experience worsening of your admission symptoms, develop shortness of breath, life threatening emergency, suicidal or homicidal thoughts you must seek medical attention immediately by calling 911 or calling your MD immediately  if symptoms less severe.  You Must read complete instructions/literature along with all the possible adverse reactions/side effects for all the Medicines you take and that have been prescribed to you. Take any new Medicines after you have completely understood and accpet all the possible adverse reactions/side effects.   Do not drive, operating heavy machinery, perform activities at heights, swimming or participation in water activities or provide baby sitting services if your were admitted for syncope or siezures until you have seen by Primary MD or a Neurologist and advised to do so again.  Do not drive when taking Pain medications.    Do not take more than prescribed Pain, Sleep and Anxiety Medications  Special Instructions: If you have smoked or chewed Tobacco  in the last 2 yrs please stop  smoking, stop any regular Alcohol  and or any Recreational drug use.  Wear Seat belts while driving.   Please note  You were cared for by a hospitalist during your hospital stay. If you have any questions about your discharge medications or the care you received while you were in the hospital after you are discharged, you can call the unit and asked to speak with the hospitalist on call if the hospitalist that took care of you is not available. Once you are discharged, your primary care physician will handle any further medical issues. Please note that NO REFILLS for any discharge medications will be authorized once you are discharged, as it is imperative that you return to your primary care physician (or establish a relationship with a primary care physician if you do not have one) for your aftercare needs so that they can reassess your need for medications and monitor your lab values.

## 2015-04-23 NOTE — Progress Notes (Signed)
pts BP @ 549 169/107 PRN 10 mg IV Hydralazine given, pt c/o blurred vision, on call MD notified and ordered 1 x 10 mg IVP Hydralazine, BP @ 649 170/112, rapid response nurse called d/t BP trending up and c/o blurred vision, will continue to monitor

## 2015-04-23 NOTE — Care Management Note (Signed)
Case Management Note  Patient Details  Name: Laron Boorman MRN: [id number] Date of Birth: [birth year]  Subjective/Objective:                    Action/Plan:  MATCH letter given . $3 per prescription . Patient voices understanding Expected Discharge Date:                  Expected Discharge Plan:  Home/Self Care  In-House Referral:     Discharge planning Services  MATCH Program, Medication Assistance, Indigent Health Clinic  Post Acute Care Choice:    Choice offered to:     DME Arranged:    DME Agency:     HH Arranged:    HH Agency:     Status of Service:  Completed, signed off  Medicare Important Message Given:    Date Medicare IM Given:    Medicare IM give by:    Date Additional Medicare IM Given:    Additional Medicare Important Message give by:     If discussed at Long Length of Stay Meetings, dates discussed:    Additional Comments:  Kingsley Plan, RN 04/23/2015, 12:15 PM

## 2015-04-23 NOTE — Care Management Note (Signed)
Case Management Note  Patient Details  Name: [name] MRN: [id number] Date of Birth: 1961/03/29  Subjective/Objective:                    Action/Plan:  Can provide patient with MATCH letter on day of discharge  Expected Discharge Date:                  Expected Discharge Plan:  Home/Self Care  In-House Referral:     Discharge planning Services  MATCH Program, Medication Assistance, Indigent Health Clinic  Post Acute Care Choice:    Choice offered to:     DME Arranged:    DME Agency:     HH Arranged:    HH Agency:     Status of Service:  In process, will continue to follow  Medicare Important Message Given:    Date Medicare IM Given:    Medicare IM give by:    Date Additional Medicare IM Given:    Additional Medicare Important Message give by:     If discussed at Long Length of Stay Meetings, dates discussed:    Additional Comments:  Kingsley Plan, RN 04/23/2015, 10:53 AM

## 2015-04-23 NOTE — Progress Notes (Signed)
Discharge home. Home diacharge instruction given.

## 2015-07-18 ENCOUNTER — Emergency Department (HOSPITAL_COMMUNITY): Payer: Self-pay

## 2015-07-18 ENCOUNTER — Encounter (HOSPITAL_COMMUNITY): Payer: Self-pay

## 2015-07-18 ENCOUNTER — Inpatient Hospital Stay (HOSPITAL_COMMUNITY)
Admission: EM | Admit: 2015-07-18 | Discharge: 2015-07-22 | DRG: 554 | Disposition: A | Discharge status: Home / Self Care | Payer: Self-pay | Attending: Internal Medicine | Admitting: Internal Medicine

## 2015-07-18 DIAGNOSIS — R0902 Hypoxemia: Secondary | ICD-10-CM | POA: Diagnosis present

## 2015-07-18 DIAGNOSIS — M109 Gout, unspecified: Secondary | ICD-10-CM | POA: Diagnosis present

## 2015-07-18 DIAGNOSIS — Z91040 Latex allergy status: Secondary | ICD-10-CM

## 2015-07-18 DIAGNOSIS — E876 Hypokalemia: Secondary | ICD-10-CM | POA: Diagnosis present

## 2015-07-18 DIAGNOSIS — I119 Hypertensive heart disease without heart failure: Secondary | ICD-10-CM | POA: Diagnosis present

## 2015-07-18 DIAGNOSIS — M25461 Effusion, right knee: Secondary | ICD-10-CM | POA: Insufficient documentation

## 2015-07-18 DIAGNOSIS — R509 Fever, unspecified: Secondary | ICD-10-CM | POA: Diagnosis present

## 2015-07-18 DIAGNOSIS — I350 Nonrheumatic aortic (valve) stenosis: Secondary | ICD-10-CM | POA: Diagnosis present

## 2015-07-18 DIAGNOSIS — Z91048 Other nonmedicinal substance allergy status: Secondary | ICD-10-CM

## 2015-07-18 DIAGNOSIS — M25562 Pain in left knee: Secondary | ICD-10-CM

## 2015-07-18 DIAGNOSIS — Z6831 Body mass index (BMI) 31.0-31.9, adult: Secondary | ICD-10-CM

## 2015-07-18 DIAGNOSIS — R0602 Shortness of breath: Secondary | ICD-10-CM

## 2015-07-18 DIAGNOSIS — R6510 Systemic inflammatory response syndrome (SIRS) of non-infectious origin without acute organ dysfunction: Secondary | ICD-10-CM | POA: Diagnosis present

## 2015-07-18 DIAGNOSIS — E875 Hyperkalemia: Secondary | ICD-10-CM | POA: Diagnosis present

## 2015-07-18 DIAGNOSIS — E785 Hyperlipidemia, unspecified: Secondary | ICD-10-CM | POA: Diagnosis present

## 2015-07-18 DIAGNOSIS — Z8249 Family history of ischemic heart disease and other diseases of the circulatory system: Secondary | ICD-10-CM

## 2015-07-18 DIAGNOSIS — I160 Hypertensive urgency: Secondary | ICD-10-CM | POA: Diagnosis present

## 2015-07-18 DIAGNOSIS — M10062 Idiopathic gout, left knee: Principal | ICD-10-CM | POA: Diagnosis present

## 2015-07-18 DIAGNOSIS — K219 Gastro-esophageal reflux disease without esophagitis: Secondary | ICD-10-CM | POA: Diagnosis present

## 2015-07-18 DIAGNOSIS — Z87891 Personal history of nicotine dependence: Secondary | ICD-10-CM

## 2015-07-18 DIAGNOSIS — E871 Hypo-osmolality and hyponatremia: Secondary | ICD-10-CM | POA: Diagnosis present

## 2015-07-18 DIAGNOSIS — M25561 Pain in right knee: Secondary | ICD-10-CM

## 2015-07-18 DIAGNOSIS — E669 Obesity, unspecified: Secondary | ICD-10-CM | POA: Diagnosis present

## 2015-07-18 DIAGNOSIS — E7800 Pure hypercholesterolemia, unspecified: Secondary | ICD-10-CM | POA: Diagnosis present

## 2015-07-18 DIAGNOSIS — E861 Hypovolemia: Secondary | ICD-10-CM | POA: Diagnosis present

## 2015-07-18 DIAGNOSIS — A419 Sepsis, unspecified organism: Secondary | ICD-10-CM

## 2015-07-18 HISTORY — DX: Nonrheumatic aortic (valve) stenosis: I35.0

## 2015-07-18 LAB — COMPREHENSIVE METABOLIC PANEL
ALK PHOS: 46 U/L (ref 38–126)
ALT: 28 U/L (ref 17–63)
AST: 25 U/L (ref 15–41)
Albumin: 3.6 g/dL (ref 3.5–5.0)
Anion gap: 11 (ref 5–15)
BUN: 13 mg/dL (ref 6–20)
CALCIUM: 9.1 mg/dL (ref 8.9–10.3)
CO2: 22 mmol/L (ref 22–32)
CREATININE: 1.09 mg/dL (ref 0.61–1.24)
Chloride: 101 mmol/L (ref 101–111)
Glucose, Bld: 106 mg/dL — ABNORMAL HIGH (ref 65–99)
Potassium: 3 mmol/L — ABNORMAL LOW (ref 3.5–5.1)
Sodium: 134 mmol/L — ABNORMAL LOW (ref 135–145)
Total Bilirubin: 0.7 mg/dL (ref 0.3–1.2)
Total Protein: 7.3 g/dL (ref 6.5–8.1)

## 2015-07-18 LAB — CBC WITH DIFFERENTIAL/PLATELET
BASOS PCT: 0 %
Basophils Absolute: 0 10*3/uL (ref 0.0–0.1)
EOS ABS: 0 10*3/uL (ref 0.0–0.7)
EOS PCT: 0 %
HCT: 40.4 % (ref 39.0–52.0)
HEMOGLOBIN: 13.4 g/dL (ref 13.0–17.0)
LYMPHS ABS: 2.2 10*3/uL (ref 0.7–4.0)
Lymphocytes Relative: 26 %
MCH: 23.6 pg — AB (ref 26.0–34.0)
MCHC: 33.2 g/dL (ref 30.0–36.0)
MCV: 71 fL — ABNORMAL LOW (ref 78.0–100.0)
MONOS PCT: 11 %
Monocytes Absolute: 1 10*3/uL (ref 0.1–1.0)
NEUTROS PCT: 62 %
Neutro Abs: 5.3 10*3/uL (ref 1.7–7.7)
PLATELETS: 144 10*3/uL — AB (ref 150–400)
RBC: 5.69 MIL/uL (ref 4.22–5.81)
RDW: 13.4 % (ref 11.5–15.5)
WBC: 8.5 10*3/uL (ref 4.0–10.5)

## 2015-07-18 LAB — SYNOVIAL CELL COUNT + DIFF, W/ CRYSTALS
Eosinophils-Synovial: 0 % (ref 0–1)
LYMPHOCYTES-SYNOVIAL FLD: 1 % (ref 0–20)
MONOCYTE-MACROPHAGE-SYNOVIAL FLUID: 4 % — AB (ref 50–90)
NEUTROPHIL, SYNOVIAL: 95 % — AB (ref 0–25)
WBC, SYNOVIAL: 4675 /mm3 — AB (ref 0–200)

## 2015-07-18 LAB — I-STAT TROPONIN, ED: TROPONIN I, POC: 0.06 ng/mL (ref 0.00–0.08)

## 2015-07-18 LAB — URINALYSIS, ROUTINE W REFLEX MICROSCOPIC
BILIRUBIN URINE: NEGATIVE
Glucose, UA: NEGATIVE mg/dL
HGB URINE DIPSTICK: NEGATIVE
Ketones, ur: NEGATIVE mg/dL
Leukocytes, UA: NEGATIVE
NITRITE: NEGATIVE
PROTEIN: 30 mg/dL — AB
SPECIFIC GRAVITY, URINE: 1.009 (ref 1.005–1.030)
pH: 6 (ref 5.0–8.0)

## 2015-07-18 LAB — URINE MICROSCOPIC-ADD ON

## 2015-07-18 LAB — I-STAT CG4 LACTIC ACID, ED: Lactic Acid, Venous: 1.13 mmol/L (ref 0.5–2.0)

## 2015-07-18 MED ORDER — POTASSIUM CHLORIDE 10 MEQ/100ML IV SOLN
10.0000 meq | INTRAVENOUS | Status: AC
  Administered 2015-07-19 (×3): 10 meq via INTRAVENOUS
  Filled 2015-07-18 (×3): qty 100

## 2015-07-18 MED ORDER — VANCOMYCIN HCL IN DEXTROSE 1-5 GM/200ML-% IV SOLN
1000.0000 mg | Freq: Once | INTRAVENOUS | Status: AC
  Administered 2015-07-18: 1000 mg via INTRAVENOUS
  Filled 2015-07-18: qty 200

## 2015-07-18 MED ORDER — IOPAMIDOL (ISOVUE-300) INJECTION 61%
INTRAVENOUS | Status: AC
  Administered 2015-07-18: 100 mL
  Filled 2015-07-18: qty 100

## 2015-07-18 MED ORDER — SODIUM CHLORIDE 0.9 % IV BOLUS (SEPSIS)
1000.0000 mL | INTRAVENOUS | Status: DC
  Administered 2015-07-18 (×2): 1000 mL via INTRAVENOUS

## 2015-07-18 MED ORDER — MORPHINE SULFATE (PF) 4 MG/ML IV SOLN
4.0000 mg | Freq: Once | INTRAVENOUS | Status: AC
  Administered 2015-07-18: 4 mg via INTRAVENOUS
  Filled 2015-07-18: qty 1

## 2015-07-18 MED ORDER — VANCOMYCIN HCL IN DEXTROSE 1-5 GM/200ML-% IV SOLN
1000.0000 mg | Freq: Three times a day (TID) | INTRAVENOUS | Status: DC
  Administered 2015-07-19 – 2015-07-20 (×4): 1000 mg via INTRAVENOUS
  Filled 2015-07-18 (×6): qty 200

## 2015-07-18 MED ORDER — LABETALOL HCL 5 MG/ML IV SOLN
10.0000 mg | Freq: Once | INTRAVENOUS | Status: AC
  Administered 2015-07-18: 10 mg via INTRAVENOUS
  Filled 2015-07-18: qty 4

## 2015-07-18 MED ORDER — PIPERACILLIN-TAZOBACTAM 3.375 G IVPB 30 MIN
3.3750 g | Freq: Once | INTRAVENOUS | Status: AC
  Administered 2015-07-18: 3.375 g via INTRAVENOUS
  Filled 2015-07-18: qty 50

## 2015-07-18 MED ORDER — PIPERACILLIN-TAZOBACTAM 3.375 G IVPB
3.3750 g | Freq: Three times a day (TID) | INTRAVENOUS | Status: DC
  Administered 2015-07-19 – 2015-07-20 (×4): 3.375 g via INTRAVENOUS
  Filled 2015-07-18 (×6): qty 50

## 2015-07-18 MED ORDER — LIDOCAINE HCL (PF) 1 % IJ SOLN
10.0000 mL | Freq: Once | INTRAMUSCULAR | Status: AC
  Administered 2015-07-18: 10 mL via INTRADERMAL
  Filled 2015-07-18: qty 10

## 2015-07-18 NOTE — ED Provider Notes (Signed)
CSN: [id number]     Arrival date & time 07/18/15  2027 History   First MD Initiated Contact with Patient 07/18/15 2047     Chief Complaint  Patient presents with  . Fever  . Headache  . Leg Swelling     (Consider location/radiation/quality/duration/timing/severity/associated sxs/prior Treatment) The history is provided by the patient and medical records. No language interpreter was used.     [name] is a 55 y.o. male  with a hx of HTN, high cholesterol, heart murmur, exertional dyspnea, gout, GERD presents to the Emergency Department complaining of gradual, persistent, progressively worsening right knee pain onset 2 days ago. with development of fever sometime in the last 24 hours.  Pt reports left knee pain and swelling onset Tuesday (4 days ago) which resolved.  Associated symptoms include generalized weakness, fever to 102 at home 1 hour PTA, generalized headache, generalized abd pain.  Pt denies recent travel, hx of gonorrhea, penile discharge, IVDU.  No treatments PTA.  No aggravating or alleviating factors. Pt denies neck stiffness, chest pain, SOB, vomiting, diarrhea, syncope, dysuria.      Past Medical History  Diagnosis Date  . High cholesterol   . Heart murmur     "leaky valve"  . Angina   . Exertional dyspnea   . Gout     "sometimes flares up even w/Allupurinol"  . Hypertension   . GERD (gastroesophageal reflux disease)   . Sleep apnea 08/2010    "not required to wear mask"   Past Surgical History  Procedure Laterality Date  . Upper gastrointestinal endoscopy  03/2011  . Colonoscopy  03/2011  . Knee arthroscopy Right 2004    "w/ligament repair in kneecap"  . Multiple tooth extractions  06/2010    full mouth  . Tonsillectomy      "I was a kid"   Family History  Problem Relation Age of Onset  . Hypertension     Social History  Substance Use Topics  . Smoking status: Current Every Day Smoker -- .1 years    Types: Cigarettes    Last Attempt to Quit:  04/04/1976  . Smokeless tobacco: Never Used  . Alcohol Use: No    Review of Systems  Constitutional: Positive for fever. Negative for diaphoresis, appetite change, fatigue and unexpected weight change.  HENT: Negative for mouth sores.   Eyes: Negative for visual disturbance.  Respiratory: Negative for cough, chest tightness, shortness of breath and wheezing.   Cardiovascular: Negative for chest pain.  Gastrointestinal: Positive for nausea and abdominal pain. Negative for vomiting, diarrhea and constipation.  Endocrine: Negative for polydipsia, polyphagia and polyuria.  Genitourinary: Negative for dysuria, urgency, frequency and hematuria.  Musculoskeletal: Positive for joint swelling and arthralgias. Negative for back pain and neck stiffness.  Skin: Negative for rash and wound.  Allergic/Immunologic: Negative for immunocompromised state.  Neurological: Positive for headaches. Negative for syncope and light-headedness.  Hematological: Does not bruise/bleed easily.  Psychiatric/Behavioral: Negative for sleep disturbance. The patient is not nervous/anxious.       Allergies  Adhesive and Latex  Home Medications   Prior to Admission medications   Medication Sig Start Date End Date Taking? Authorizing Provider  amLODipine (NORVASC) 10 MG tablet Take 1 tablet (10 mg total) by mouth daily. 04/23/15  Yes Starleen Arms, MD  colchicine 0.6 MG tablet Please take once daily as needed for gout pain 04/23/15  Yes Leana Roe Elgergawy, MD  lisinopril (PRINIVIL,ZESTRIL) 20 MG tablet Take 1 tablet (20 mg total) by  mouth once. 04/23/15  Yes Dawood S Elgergawy, MD   BP 184/112 mmHg  Pulse 96  Temp(Src) 102.4 F (39.1 C) (Rectal)  Resp 17  Ht  (1.753 m)  Wt 92.987 kg  BMI 30.26 kg/m2  SpO2 90% Physical Exam  Constitutional: He appears well-developed and well-nourished. He appears lethargic. No distress.  Awake, lethargic, ill appearing   HENT:  Head: Normocephalic and atraumatic.   Mouth/Throat: Oropharynx is clear and moist. No oropharyngeal exudate.  Eyes: Conjunctivae are normal. No scleral icterus.  Neck: Normal range of motion. Neck supple.  Cardiovascular: Regular rhythm and intact distal pulses.  Tachycardia present.   Murmur heard. Pulses:      Radial pulses are 2+ on the right side, and 2+ on the left side.       Dorsalis pedis pulses are 2+ on the right side, and 2+ on the left side.  Pulmonary/Chest: Effort normal and breath sounds normal. No accessory muscle usage. No respiratory distress. He has no decreased breath sounds. He has no wheezes. He has no rhonchi. He has no rales.  Equal chest expansion  Abdominal: Soft. Bowel sounds are normal. He exhibits no mass. There is generalized tenderness. There is guarding. There is no rebound and no CVA tenderness. Hernia confirmed negative in the right inguinal area and confirmed negative in the left inguinal area.  Genitourinary: Testes normal. Right testis shows no mass, no swelling and no tenderness. Left testis shows no mass, no swelling and no tenderness. Uncircumcised.  No TTP of the perineum  Musculoskeletal: He exhibits no edema.       Right hip: He exhibits decreased range of motion (moderate) and tenderness (mild).       Right knee: He exhibits decreased range of motion ( almost no movement), swelling, effusion and erythema ( HOT to touch). Tenderness (exquisite throughout ) found.       Left knee: He exhibits decreased range of motion (moderate flexion and full extension), swelling and effusion. He exhibits no ecchymosis and no erythema. Tenderness ( mild) found.  Engorgement of the veins of the lower extremities noted bilaterally with edema, no pitting  Lymphadenopathy:       Right: No inguinal adenopathy present.       Left: No inguinal adenopathy present.  Neurological: He appears lethargic. No cranial nerve deficit or sensory deficit. GCS eye subscore is 3. GCS verbal subscore is 5. GCS motor subscore  is 6.  Speech is clear and goal oriented Moves extremities without ataxia  Skin: Skin is warm and dry. He is not diaphoretic.  Skin is hot and dry Several small abrasions to the lower shins without surrounding erythema or induration  Psychiatric: He has a normal mood and affect.  Nursing note and vitals reviewed.   ED Course  .Joint Aspiration/Arthrocentesis Date/Time: 07/18/2015 10:26 PM Performed by: Dierdre Forth Authorized by: Dierdre Forth Consent: Verbal consent obtained. Written consent not obtained. Risks and benefits: risks, benefits and alternatives were discussed Consent given by: patient and spouse Patient understanding: patient states understanding of the procedure being performed Patient consent: the patient's understanding of the procedure matches consent given Procedure consent: procedure consent matches procedure scheduled Relevant documents: relevant documents present and verified Site marked: the operative site was marked Required items: required blood products, implants, devices, and special equipment available Patient identity confirmed: verbally with patient and arm band Time out: Immediately prior to procedure a "time out" was called to verify the correct patient, procedure, equipment, support staff and site/side  marked as required. Indications: joint swelling,  pain,  possible septic joint and diagnostic evaluation  Body area: knee Joint: right knee Local anesthesia used: yes Local anesthetic: lidocaine 1% without epinephrine Anesthetic total: 4 ml Patient sedated: no Preparation: Patient was prepped and draped in the usual sterile fashion. Needle gauge: 18 G Ultrasound guidance: no Approach: lateral Aspirate: cloudy and yellow Aspirate amount: 135 mL Patient tolerance: Patient tolerated the procedure well with no immediate complications   (including critical care time) Labs Review Labs Reviewed  COMPREHENSIVE METABOLIC PANEL -  Abnormal; Notable for the following:    Sodium 134 (*)    Potassium 3.0 (*)    Glucose, Bld 106 (*)    All other components within normal limits  CBC WITH DIFFERENTIAL/PLATELET - Abnormal; Notable for the following:    MCV 71.0 (*)    MCH 23.6 (*)    Platelets 144 (*)    All other components within normal limits  URINALYSIS, ROUTINE W REFLEX MICROSCOPIC (NOT [facility]) - Abnormal; Notable for the following:    Protein, ur 30 (*)    All other components within normal limits  SYNOVIAL CELL COUNT + DIFF, W/ CRYSTALS - Abnormal; Notable for the following:    Appearance-Synovial TURBID (*)    WBC, Synovial 4675 (*)    Neutrophil, Synovial 95 (*)    Monocyte-Macrophage-Synovial Fluid 4 (*)    All other components within normal limits  URINE MICROSCOPIC-ADD ON - Abnormal; Notable for the following:    Squamous Epithelial / LPF 0-5 (*)    Bacteria, UA RARE (*)    All other components within normal limits  GRAM STAIN  CULTURE, BLOOD (ROUTINE X 2)  CULTURE, BLOOD (ROUTINE X 2)  URINE CULTURE  TSH  T4, FREE  INFLUENZA PANEL BY PCR (TYPE A & B, H1N1)  I-STAT CG4 LACTIC ACID, ED  I-STAT TROPOININ, ED  I-STAT CG4 LACTIC ACID, ED    Imaging Review Ct Abdomen Pelvis W Contrast  07/19/2015  CLINICAL DATA:  Fever and abdominal tightness. EXAM: CT ABDOMEN AND PELVIS WITH CONTRAST TECHNIQUE: Multidetector CT imaging of the abdomen and pelvis was performed using the standard protocol following bolus administration of intravenous contrast. CONTRAST:  80mL ISOVUE-300 IOPAMIDOL (ISOVUE-300) INJECTION 61% COMPARISON:  None. FINDINGS: Lower chest:  No significant abnormality Hepatobiliary: There are normal appearances of the liver, gallbladder and bile ducts. Pancreas: Normal Spleen: Normal Adrenals/Urinary Tract: The adrenals and kidneys are normal in appearance. There is no urinary calculus evident. There is no hydronephrosis or ureteral dilatation. Collecting systems and ureters appear unremarkable.  Stomach/Bowel: There are normal appearances of the stomach, small bowel and colon. The appendix is normal. Vascular/Lymphatic: The abdominal aorta is normal in caliber with mild atherosclerotic calcification. There are multiple nonspecific para-aortic nodes. These only measure up to a short axis of about 12 mm, but they are generous in number. Reproductive: Unremarkable Other: No acute inflammatory changes are evident in the abdomen or pelvis. There is no ascites. Musculoskeletal: No significant abnormality IMPRESSION: No acute findings are evident in the abdomen or pelvis. Nonspecific mildly prominent periaortic nodes are present. Electronically Signed   By: Ellery Plunkaniel R Mitchell M.D.   On: 07/19/2015 00:20   Dg Chest Port 1 View  07/18/2015  CLINICAL DATA:  Acute onset of fever and generalized abdominal pain. Initial encounter. EXAM: PORTABLE CHEST 1 VIEW COMPARISON:  Chest radiograph performed 04/20/2015 FINDINGS: The lungs are well-aerated and clear. There is no evidence of focal opacification, pleural effusion or pneumothorax. The  cardiomediastinal silhouette is within normal limits. No acute osseous abnormalities are seen. IMPRESSION: No acute cardiopulmonary process seen. Electronically Signed   By: Roanna Raider M.D.   On: 07/18/2015 23:08   I have personally reviewed and evaluated these images and lab results as part of my medical decision-making.   EKG Interpretation   Date/Time:  Saturday July 18 2015 21:28:40 EDT Ventricular Rate:  100 PR Interval:  165 QRS Duration: 96 QT Interval:  370 QTC Calculation: 477 R Axis:   10 Text Interpretation:  Sinus tachycardia LAE, consider biatrial enlargement  Left ventricular hypertrophy No significant change since last tracing  Confirmed by [facility] MD, ERIN ([id number]) on 07/18/2015 11:47:11 PM      CRITICAL CARE Performed by: Dierdre Forth Total critical care time: 60 minutes Critical care time was exclusive of separately billable  procedures and treating other patients. Critical care was necessary to treat or prevent imminent or life-threatening deterioration. Critical care was time spent personally by me on the following activities: development of treatment plan with patient and/or surrogate as well as nursing, discussions with consultants, evaluation of patient's response to treatment, examination of patient, obtaining history from patient or surrogate, ordering and performing treatments and interventions, ordering and review of laboratory studies, ordering and review of radiographic studies, pulse oximetry and re-evaluation of patient's condition.   MDM   Final diagnoses:  Fever, unspecified fever cause  Sepsis, due to unspecified organism [facility])   [name] presents with sepsis. Patient with fever, tachycardia, tachypnea, hypertension and hypoxia.  Unknown source. Concern for septic joint. Joint aspiration performed with cloudy fluid. Gram stain shows abundant white blood cells and cell count shows approximately 4700 white blood cells with monosodium urate crystals.  Labs show hypokalemia at 3.0. Pt also with abdominal pain.  12:46 AM CT scan of the abdomen without acute abnormality. Unknown source of patient's sepsis.  Continue broad-spectrum antibiotics and admit for further evaluation.  1:14 AM Dr. Maryfrances Bunnell will admit to medsurg.    BP 184/112 mmHg  Pulse 96  Temp(Src) 102.4 F (39.1 C) (Rectal)  Resp 17  Ht 5\' 9"  (1.753 m)  Wt 92.987 kg  BMI 30.26 kg/m2  SpO2 90%   The patient was discussed with and seen by Dr. Dalene Seltzer who agrees with the treatment plan.   Dahlia Client Aime Carreras, PA-C 07/19/15 6045  Alvira Monday, MD 07/20/15 1432

## 2015-07-18 NOTE — ED Notes (Signed)
Aspiration tray at bedside. 

## 2015-07-18 NOTE — ED Notes (Signed)
Hannah PA at bedside

## 2015-07-18 NOTE — Progress Notes (Signed)
Pharmacy Antibiotic Note  [name] is a 55 y.o. male admitted on 07/18/2015 with sepsis.  Pharmacy has been consulted for vancomycin and zosyn dosing.  Tmax 102.4. Baseline labs ordered. Will follow up and adjust antibiotics accordingly. Plan: Zosyn 3.375g IV q8 hours Vancomycin 1g q8 Follow up cx data and renal function  Height: 5\' 9"  (175.3 cm) Weight: 205 lb (92.987 kg) IBW/kg (Calculated) : 70.7  Temp (24hrs), Avg:102.4 F (39.1 C), Min:102.4 F (39.1 C), Max:102.4 F (39.1 C)  No results for input(s): WBC, CREATININE, LATICACIDVEN, VANCOTROUGH, VANCOPEAK, VANCORANDOM, GENTTROUGH, GENTPEAK, GENTRANDOM, TOBRATROUGH, TOBRAPEAK, TOBRARND, AMIKACINPEAK, AMIKACINTROU, AMIKACIN in the last 168 hours.  CrCl cannot be calculated (Patient has no serum creatinine result on file.).    Allergies  Allergen Reactions  . Adhesive [Tape] Other (See Comments)    Makes the skin feel as if it is burning, will also bruise the skin.  . Latex Hives and Itching    Burns skin    Antimicrobials this admission: Vanc 4/15>> Zosyn 4/15>>  Dose adjustments this admission:   Microbiology results:  Thank you for allowing pharmacy to be a part of this patient's care.  [name] PharmD., BCPS Clinical Pharmacist Pager [phone] 07/18/2015 9:50 PM

## 2015-07-18 NOTE — ED Notes (Signed)
Pt arrived via [facility] c/o fever (home 102.32F), headache, abdominal tightness and BLE pain and swelling x3 days.  CBG 117.

## 2015-07-19 ENCOUNTER — Inpatient Hospital Stay (HOSPITAL_COMMUNITY): Payer: Self-pay

## 2015-07-19 ENCOUNTER — Encounter (HOSPITAL_COMMUNITY): Payer: Self-pay | Admitting: Family Medicine

## 2015-07-19 DIAGNOSIS — M10061 Idiopathic gout, right knee: Secondary | ICD-10-CM

## 2015-07-19 DIAGNOSIS — R509 Fever, unspecified: Secondary | ICD-10-CM | POA: Insufficient documentation

## 2015-07-19 DIAGNOSIS — M25561 Pain in right knee: Secondary | ICD-10-CM | POA: Insufficient documentation

## 2015-07-19 DIAGNOSIS — M1000 Idiopathic gout, unspecified site: Secondary | ICD-10-CM

## 2015-07-19 DIAGNOSIS — E876 Hypokalemia: Secondary | ICD-10-CM

## 2015-07-19 DIAGNOSIS — I160 Hypertensive urgency: Secondary | ICD-10-CM

## 2015-07-19 DIAGNOSIS — M25461 Effusion, right knee: Secondary | ICD-10-CM | POA: Insufficient documentation

## 2015-07-19 LAB — CBC
HEMATOCRIT: 37.5 % — AB (ref 39.0–52.0)
Hemoglobin: 12.6 g/dL — ABNORMAL LOW (ref 13.0–17.0)
MCH: 24.2 pg — ABNORMAL LOW (ref 26.0–34.0)
MCHC: 33.6 g/dL (ref 30.0–36.0)
MCV: 72.1 fL — AB (ref 78.0–100.0)
PLATELETS: 118 10*3/uL — AB (ref 150–400)
RBC: 5.2 MIL/uL (ref 4.22–5.81)
RDW: 13.8 % (ref 11.5–15.5)
WBC: 7.7 10*3/uL (ref 4.0–10.5)

## 2015-07-19 LAB — URIC ACID: Uric Acid, Serum: 6.4 mg/dL (ref 4.4–7.6)

## 2015-07-19 LAB — BASIC METABOLIC PANEL
Anion gap: 10 (ref 5–15)
BUN: 9 mg/dL (ref 6–20)
CALCIUM: 7.9 mg/dL — AB (ref 8.9–10.3)
CO2: 20 mmol/L — AB (ref 22–32)
CREATININE: 1 mg/dL (ref 0.61–1.24)
Chloride: 110 mmol/L (ref 101–111)
GFR calc Af Amer: >60 mL/min (ref >60)
GLUCOSE: 116 mg/dL — AB (ref 65–99)
Potassium: 3.3 mmol/L — ABNORMAL LOW (ref 3.5–5.1)
Sodium: 140 mmol/L (ref 135–145)

## 2015-07-19 LAB — INFLUENZA PANEL BY PCR (TYPE A & B)
H1N1FLUPCR: NOT DETECTED
INFLAPCR: NEGATIVE
INFLBPCR: NEGATIVE

## 2015-07-19 LAB — MAGNESIUM: Magnesium: 1.5 mg/dL — ABNORMAL LOW (ref 1.7–2.4)

## 2015-07-19 LAB — TSH: TSH: 0.506 u[IU]/mL (ref 0.350–4.500)

## 2015-07-19 LAB — T4, FREE: FREE T4: 0.75 ng/dL (ref 0.61–1.12)

## 2015-07-19 LAB — GRAM STAIN: Special Requests: NORMAL

## 2015-07-19 MED ORDER — METHYLPREDNISOLONE ACETATE 40 MG/ML IJ SUSP
80.0000 mg | Freq: Once | INTRAMUSCULAR | Status: DC
  Filled 2015-07-19 (×2): qty 2

## 2015-07-19 MED ORDER — COLCHICINE 0.6 MG PO TABS
0.6000 mg | ORAL_TABLET | Freq: Two times a day (BID) | ORAL | Status: DC
  Administered 2015-07-19 – 2015-07-21 (×6): 0.6 mg via ORAL
  Filled 2015-07-19 (×6): qty 1

## 2015-07-19 MED ORDER — LIDOCAINE HCL (PF) 1 % IJ SOLN
3.0000 mL | Freq: Once | INTRAMUSCULAR | Status: AC
  Administered 2015-07-19: 3 mL via INTRADERMAL
  Filled 2015-07-19: qty 30

## 2015-07-19 MED ORDER — AMLODIPINE BESYLATE 10 MG PO TABS
10.0000 mg | ORAL_TABLET | Freq: Every day | ORAL | Status: DC
  Administered 2015-07-19 – 2015-07-21 (×3): 10 mg via ORAL
  Filled 2015-07-19 (×3): qty 1

## 2015-07-19 MED ORDER — ENOXAPARIN SODIUM 40 MG/0.4ML ~~LOC~~ SOLN
40.0000 mg | SUBCUTANEOUS | Status: DC
  Filled 2015-07-19 (×3): qty 0.4

## 2015-07-19 MED ORDER — BUPIVACAINE HCL 0.5 % IJ SOLN
50.0000 mL | Freq: Once | INTRAMUSCULAR | Status: AC
  Administered 2015-07-19: 50 mL
  Filled 2015-07-19: qty 50

## 2015-07-19 MED ORDER — METHYLPREDNISOLONE SODIUM SUCC 125 MG IJ SOLR
60.0000 mg | INTRAMUSCULAR | Status: DC
  Administered 2015-07-19 – 2015-07-21 (×3): 60 mg via INTRAVENOUS
  Filled 2015-07-19 (×3): qty 2

## 2015-07-19 MED ORDER — CARVEDILOL 6.25 MG PO TABS
6.2500 mg | ORAL_TABLET | Freq: Two times a day (BID) | ORAL | Status: DC
  Administered 2015-07-19 – 2015-07-20 (×2): 6.25 mg via ORAL
  Filled 2015-07-19 (×2): qty 1

## 2015-07-19 MED ORDER — ACETAMINOPHEN 325 MG PO TABS
650.0000 mg | ORAL_TABLET | Freq: Four times a day (QID) | ORAL | Status: DC | PRN
  Administered 2015-07-19 – 2015-07-21 (×2): 650 mg via ORAL
  Filled 2015-07-19 (×2): qty 2

## 2015-07-19 MED ORDER — POTASSIUM CHLORIDE CRYS ER 20 MEQ PO TBCR
40.0000 meq | EXTENDED_RELEASE_TABLET | Freq: Once | ORAL | Status: AC
  Administered 2015-07-19: 40 meq via ORAL
  Filled 2015-07-19: qty 2

## 2015-07-19 MED ORDER — FENTANYL CITRATE (PF) 100 MCG/2ML IJ SOLN
25.0000 ug | Freq: Once | INTRAMUSCULAR | Status: AC
  Administered 2015-07-19: 25 ug via INTRAVENOUS
  Filled 2015-07-19: qty 2

## 2015-07-19 MED ORDER — MAGNESIUM SULFATE 2 GM/50ML IV SOLN
2.0000 g | Freq: Once | INTRAVENOUS | Status: AC
  Administered 2015-07-19: 2 g via INTRAVENOUS
  Filled 2015-07-19: qty 50

## 2015-07-19 MED ORDER — POTASSIUM CHLORIDE IN NACL 20-0.9 MEQ/L-% IV SOLN
INTRAVENOUS | Status: DC
  Administered 2015-07-19: 04:00:00 via INTRAVENOUS
  Filled 2015-07-19 (×2): qty 1000

## 2015-07-19 MED ORDER — ACETAMINOPHEN 650 MG RE SUPP: 650.0000 mg | Freq: Four times a day (QID) | RECTAL | Status: DC | PRN

## 2015-07-19 MED ORDER — ACETAMINOPHEN 325 MG PO TABS
650.0000 mg | ORAL_TABLET | Freq: Once | ORAL | Status: AC
  Administered 2015-07-19: 650 mg via ORAL
  Filled 2015-07-19: qty 2

## 2015-07-19 MED ORDER — NAPROXEN 250 MG PO TABS
250.0000 mg | ORAL_TABLET | Freq: Two times a day (BID) | ORAL | Status: DC
  Administered 2015-07-19: 250 mg via ORAL
  Filled 2015-07-19: qty 1

## 2015-07-19 MED ORDER — ENOXAPARIN SODIUM 40 MG/0.4ML ~~LOC~~ SOLN
40.0000 mg | SUBCUTANEOUS | Status: DC
  Administered 2015-07-19: 40 mg via SUBCUTANEOUS
  Filled 2015-07-19: qty 0.4

## 2015-07-19 MED ORDER — HYDRALAZINE HCL 20 MG/ML IJ SOLN
10.0000 mg | Freq: Four times a day (QID) | INTRAMUSCULAR | Status: DC | PRN
Start: 2015-07-19 — End: 2015-07-19
  Administered 2015-07-19: 10 mg via INTRAVENOUS
  Filled 2015-07-19: qty 1

## 2015-07-19 MED ORDER — INDOMETHACIN 25 MG PO CAPS
50.0000 mg | ORAL_CAPSULE | Freq: Three times a day (TID) | ORAL | Status: DC
  Administered 2015-07-19 – 2015-07-20 (×2): 50 mg via ORAL
  Filled 2015-07-19 (×2): qty 2

## 2015-07-19 MED ORDER — METHYLPREDNISOLONE ACETATE 40 MG/ML IJ SUSP
80.0000 mg | Freq: Once | INTRAMUSCULAR | Status: AC
  Administered 2015-07-19: 80 mg via INTRA_ARTICULAR
  Filled 2015-07-19: qty 2

## 2015-07-19 MED ORDER — HYDRALAZINE HCL 20 MG/ML IJ SOLN
10.0000 mg | Freq: Four times a day (QID) | INTRAMUSCULAR | Status: DC | PRN
  Administered 2015-07-20: 10 mg via INTRAVENOUS
  Filled 2015-07-19: qty 1

## 2015-07-19 MED ORDER — MORPHINE SULFATE (PF) 2 MG/ML IV SOLN
2.0000 mg | Freq: Once | INTRAVENOUS | Status: AC
  Administered 2015-07-19: 4 mg via INTRAVENOUS
  Filled 2015-07-19: qty 2

## 2015-07-19 MED ORDER — LISINOPRIL 20 MG PO TABS
20.0000 mg | ORAL_TABLET | Freq: Once | ORAL | Status: AC
  Administered 2015-07-19: 20 mg via ORAL
  Filled 2015-07-19: qty 1

## 2015-07-19 MED ORDER — SENNOSIDES-DOCUSATE SODIUM 8.6-50 MG PO TABS: 1.0000 | ORAL_TABLET | Freq: Every evening | ORAL | Status: DC | PRN

## 2015-07-19 MED ORDER — NAPROXEN 250 MG PO TABS
500.0000 mg | ORAL_TABLET | Freq: Once | ORAL | Status: AC
  Administered 2015-07-19: 500 mg via ORAL
  Filled 2015-07-19: qty 2

## 2015-07-19 NOTE — H&P (Signed)
History and Physical  Patient Name: [name]     ZOX:[id number]    DOB: February 18, 1961    DOA: 07/18/2015 Referring provider: Oliver Barrehanna Muthersbaugh, PA-C PCP: No PCP Per Patient  Outpatient specialists: None Patient coming from: Home  Chief Complaint: Fever and migratory knee pain  HPI: [name] is a 55 y.o. male with a past medical history significant for HTN poorly controlled and gout who presents with fever, malaise and 1 week migratory knee pain.  The patient was in his usual state of health until Tuesday when he had left knee swelling and pain, but kept him home from work. By Wednesday this was improved and he was able to work, but on Thursday he woke up again with knee pain and swelling this time on the right side.  Since Thursday, right knee pain and swelling have persisted, and pain is now worse and he is not able to walk. It is also accompanied by fever, flushing, weakness, headache, mild cough. He has had no sick contacts. No sputum, dyspnea, rigors, dysuria.  In the ED, he was febrile to 100 2.79F, tachycardic, tachypneic, hypertensive and saturating well on ambient air.  Na 134, K 3.0, Cr 1.1, WBC 8.5K, Hgb 13, lactate normal.  UA clear.  The right knee was tapped, had only 4000 cells primarily PMN, as well as MSU crystals.  Flu swab and blood cultures were obtained, vanc and Zosyn were administered, and TRH were asked to evaluate for admission.     Review of Systems:  Pt complains of knee pain/swelling, fever, headache, myalgias, malaise, abdominal discomfort/upset. Pt denies any neck pain, photophobia, confusion, cough, sputum, dyspnea, dysuria.  All other systems negative except as just noted or noted in the history of present illness.    Past Medical History  Diagnosis Date  . High cholesterol   . Heart murmur     "leaky valve"  . Angina   . Exertional dyspnea   . Gout     "sometimes flares up even w/Allupurinol"  . Hypertension   . GERD (gastroesophageal reflux  disease)   . Sleep apnea 08/2010    "not required to wear mask"    Past Surgical History  Procedure Laterality Date  . Upper gastrointestinal endoscopy  03/2011  . Colonoscopy  03/2011  . Knee arthroscopy Right 2004    "w/ligament repair in kneecap"  . Multiple tooth extractions  06/2010    full mouth  . Tonsillectomy      "I was a kid"    Social History: Patient lives with his wife, who has breast cancer, recently recurred.  He is a remote former smoker.  He has three small children at home.  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    Allergies  Allergen Reactions  . Adhesive [Tape] Other (See Comments)    Makes the skin feel as if it is burning, will also bruise the skin.  . Latex Hives and Itching    Burns skin    Family history: family history includes Asthma in his daughter.  Prior to Admission medications   Medication Sig Start Date End Date Taking? Authorizing Provider  amLODipine (NORVASC) 10 MG tablet Take 1 tablet (10 mg total) by mouth daily. 04/23/15  Yes Starleen Armsawood S Elgergawy, MD  colchicine 0.6 MG tablet Please take once daily as needed for gout pain 04/23/15  Yes Starleen Armsawood S Elgergawy, MD  lisinopril (PRINIVIL,ZESTRIL) 20 MG tablet Take 1 tablet (20 mg total) by mouth once. 04/23/15  Yes Dawood S  Elgergawy, MD       Physical Exam: BP 168/111 mmHg  Pulse 94  Temp(Src) 102.4 F (39.1 C) (Rectal)  Resp 20  Ht  (1.753 m)  Wt 92.987 kg (205 lb)  BMI 30.26 kg/m2  SpO2 91% General appearance: Well-developed, adult male, alert but ill appearing and in mild distress from fever.   Eyes: Anicteric, conjunctiva pink, lids and lashes normal.     ENT: No nasal deformity, discharge, or epistaxis.  OP moist without lesions.   Lymph: No cervical or supraclavicular lymphadenopathy. Skin: Warm and moist.  No suspicious rashes or lesions.  No redness over legs. Cardiac: Tachycardic, nl S1-S2, 2/6 SEM.  Capillary refill is brisk.  JVP normal.  No LE edema.  Radial and DP  pulses 2+ and symmetric. Respiratory: Normal respiratory rate and rhythm.  CTAB without rales or wheezes. Abdomen: Abdomen soft without rigidity.  No TTP. No ascites, distension.   MSK: No effusions appreciated.  L knee tender upper outer with flexion, but flexes to 80 degrees.  R knee limited by pain, but flexes >30 degrees.   Neuro: Sensorium intact and responding to questions, attention normal.  Speech is fluent.  Moves all extremities equally and with normal coordination.   Cranial nerves normal. Psych: Behavior appropriate.  Affect normal.  No evidence of aural or visual hallucinations or delusions.       Labs on Admission:  I have personally reviewed the following studies: The metabolic panel shows mild hyponatremia and hypokalemia. TSH normal. Lactate normal. Troponin negative. Urinalysis completely clear. Blood cultures pending Flu pending. The complete blood count shows no leukocytosis, anemia, thrombocytopenia.   Radiological Exams on Admission: Personally reviewed: Ct Abdomen Pelvis W Contrast  07/19/2015  CLINICAL DATA:  Fever and abdominal tightness. EXAM: CT ABDOMEN AND PELVIS WITH CONTRAST TECHNIQUE: Multidetector CT imaging of the abdomen and pelvis was performed using the standard protocol following bolus administration of intravenous contrast. CONTRAST:  80mL ISOVUE-300 IOPAMIDOL (ISOVUE-300) INJECTION 61% COMPARISON:  None. FINDINGS: Lower chest:  No significant abnormality Hepatobiliary: There are normal appearances of the liver, gallbladder and bile ducts. Pancreas: Normal Spleen: Normal Adrenals/Urinary Tract: The adrenals and kidneys are normal in appearance. There is no urinary calculus evident. There is no hydronephrosis or ureteral dilatation. Collecting systems and ureters appear unremarkable. Stomach/Bowel: There are normal appearances of the stomach, small bowel and colon. The appendix is normal. Vascular/Lymphatic: The abdominal aorta is normal in caliber with  mild atherosclerotic calcification. There are multiple nonspecific para-aortic nodes. These only measure up to a short axis of about 12 mm, but they are generous in number. Reproductive: Unremarkable Other: No acute inflammatory changes are evident in the abdomen or pelvis. There is no ascites. Musculoskeletal: No significant abnormality IMPRESSION: No acute findings are evident in the abdomen or pelvis. Nonspecific mildly prominent periaortic nodes are present. Electronically Signed   By: Ellery Plunk M.D.   On: 07/19/2015 00:20   Dg Chest Port 1 View  07/18/2015  CLINICAL DATA:  Acute onset of fever and generalized abdominal pain. Initial encounter. EXAM: PORTABLE CHEST 1 VIEW COMPARISON:  Chest radiograph performed 04/20/2015 FINDINGS: The lungs are well-aerated and clear. There is no evidence of focal opacification, pleural effusion or pneumothorax. The cardiomediastinal silhouette is within normal limits. No acute osseous abnormalities are seen. IMPRESSION: No acute cardiopulmonary process seen. Electronically Signed   By: Roanna Raider M.D.   On: 07/18/2015 23:08    EKG: Independently reviewed. Rate 100, QTC 477, LV strain pattern,  no change from previous.  Echocardiogram 2015: EF 65%, grade I dia dysfunction, no valve disease.    Assessment/Plan 1. Fever:  Meets SIRS criteria.  CODE Sepsis called in ED, but no obvious source of infection and no evidence of end organ damage, sepsis doubted.  SIRS exclusively from gout flare?  Influenza possible.  Septic joint doubted. -Continue vancomycin and piperacillin-tazobactam for now, de-escalate if able -Follow blood cultures -Joint culture added -Has hx of AS, if cultures positive, add TTE   2. Gout flare:  Good renal function.  Joint aspirate with ~4K cells, neutrophilia; MSU crystals present.  Able to bend joints -Naproxen 500 mg now then 250 BID  3. Hypertensive urgency:  Labetalol given once in ER. -Continue home amlodipine and  lisinopril  4. Hypokalemia:  -Check magnesium -Fluids with K  5. Hyponatremia:  Likely hypovolemic -Trend BMP     DVT prophylaxis: Lovenox  Code Status: FULL  Family Communication: Wife at bedside  Disposition Plan: Anticipate treat gout, follow culture data.  Continue antibiotics but de-escalate if able. Consults called: None  Admission status: I recommend admission to med surg, inpatietn status.  Clinical condition: stable at present.   Patient seen at 0145 on 07/19/2015    Earl Lites [facility] Pager [phone]

## 2015-07-19 NOTE — ED Notes (Signed)
[facility]

## 2015-07-19 NOTE — Consult Note (Signed)
Reason for Consult:right knee pain Gout Referring Physician: [facility]  [name] is an 55 y.o. male.  HPI: 23 yoaam admitted last light for fever and swollen right knee.  Has a history of gout and two knee arthroscopies done on that knee in [place].  Knee aspirated last night in the ER.  No infection.  Positive for Monosodium Urate crystals.  Today patient has recurrent painful effusion.  Past Medical History  Diagnosis Date  . High cholesterol   . Heart murmur     "leaky valve"  . Angina   . Exertional dyspnea   . Gout     "sometimes flares up even w/Allupurinol"  . Hypertension   . GERD (gastroesophageal reflux disease)   . Sleep apnea 08/2010    "not required to wear mask"    Past Surgical History  Procedure Laterality Date  . Upper gastrointestinal endoscopy  03/2011  . Colonoscopy  03/2011  . Knee arthroscopy Right 2004    "w/ligament repair in kneecap"  . Multiple tooth extractions  06/2010    full mouth  . Tonsillectomy      "I was a kid"    Family History  Problem Relation Age of Onset  . Hypertension    . Asthma Daughter     Social History:  reports that he quit smoking about 39 years ago. His smoking use included Cigarettes. He quit after .1 years of use. He has never used smokeless tobacco. He reports that he uses illicit drugs (Marijuana). He reports that he does not drink alcohol.  Allergies:  Allergies  Allergen Reactions  . Adhesive [Tape] Other (See Comments)    Makes the skin feel as if it is burning, will also bruise the skin.  . Latex Hives and Itching    Burns skin    Medications:  Prior to Admission:  Prescriptions prior to admission  Medication Sig Dispense Refill Last Dose  . amLODipine (NORVASC) 10 MG tablet Take 1 tablet (10 mg total) by mouth daily. 30 tablet 3 07/17/2015 at Unknown time  . colchicine 0.6 MG tablet Please take once daily as needed for gout pain 30 tablet 0 last month  . lisinopril (PRINIVIL,ZESTRIL) 20  MG tablet Take 1 tablet (20 mg total) by mouth once. 30 tablet 3 07/17/2015 at Unknown time    Results for orders placed or performed during the hospital encounter of 07/18/15 (from the past 48 hour(s))  Comprehensive metabolic panel     Status: Abnormal   Collection Time: 07/18/15  9:50 PM  Result Value Ref Range   Sodium 134 (L) 135 - 145 mmol/L   Potassium 3.0 (L) 3.5 - 5.1 mmol/L   Chloride 101 101 - 111 mmol/L   CO2 22 22 - 32 mmol/L   Glucose, Bld 106 (H) 65 - 99 mg/dL   BUN 13 6 - 20 mg/dL   Creatinine, Ser 1.09 0.61 - 1.24 mg/dL   Calcium 9.1 8.9 - 10.3 mg/dL   Total Protein 7.3 6.5 - 8.1 g/dL   Albumin 3.6 3.5 - 5.0 g/dL   AST 25 15 - 41 U/L   ALT 28 17 - 63 U/L   Alkaline Phosphatase 46 38 - 126 U/L   Total Bilirubin 0.7 0.3 - 1.2 mg/dL   GFR calc non Af Amer >60 >60 mL/min   GFR calc Af Amer >60 >60 mL/min    Comment: (NOTE) The eGFR has been calculated using the CKD EPI equation. This calculation has not been validated  in all clinical situations. eGFR's persistently <60 mL/min signify possible Chronic Kidney Disease.    Anion gap 11 5 - 15  CBC WITH DIFFERENTIAL     Status: Abnormal   Collection Time: 07/18/15  9:50 PM  Result Value Ref Range   WBC 8.5 4.0 - 10.5 K/uL   RBC 5.69 4.22 - 5.81 MIL/uL   Hemoglobin 13.4 13.0 - 17.0 g/dL   HCT 40.4 39.0 - 52.0 %   MCV 71.0 (L) 78.0 - 100.0 fL   MCH 23.6 (L) 26.0 - 34.0 pg   MCHC 33.2 30.0 - 36.0 g/dL   RDW 13.4 11.5 - 15.5 %   Platelets 144 (L) 150 - 400 K/uL   Neutrophils Relative % 62 %   Neutro Abs 5.3 1.7 - 7.7 K/uL   Lymphocytes Relative 26 %   Lymphs Abs 2.2 0.7 - 4.0 K/uL   Monocytes Relative 11 %   Monocytes Absolute 1.0 0.1 - 1.0 K/uL   Eosinophils Relative 0 %   Eosinophils Absolute 0.0 0.0 - 0.7 K/uL   Basophils Relative 0 %   Basophils Absolute 0.0 0.0 - 0.1 K/uL  TSH     Status: None   Collection Time: 07/18/15  9:50 PM  Result Value Ref Range   TSH 0.506 0.350 - 4.500 uIU/mL  T4, free      Status: None   Collection Time: 07/18/15  9:50 PM  Result Value Ref Range   Free T4 0.75 0.61 - 1.12 ng/dL  I-Stat CG4 Lactic Acid, ED  (not at  [facility])     Status: None   Collection Time: 07/18/15 10:08 PM  Result Value Ref Range   Lactic Acid, Venous 1.13 0.5 - 2.0 mmol/L  I-stat troponin, ED (not at [facility])     Status: None   Collection Time: 07/18/15 10:18 PM  Result Value Ref Range   Troponin i, poc 0.06 0.00 - 0.08 ng/mL   Comment 3            Comment: Due to the release kinetics of cTnI, a negative result within the first hours of the onset of symptoms does not rule out myocardial infarction with certainty. If myocardial infarction is still suspected, repeat the test at appropriate intervals.   Gram stain     Status: None   Collection Time: 07/18/15 10:19 PM  Result Value Ref Range   Specimen Description FLUID RIGHT KNEE    Special Requests Normal    Gram Stain      ABUNDANT WBC PRESENT,BOTH PMN AND MONONUCLEAR NO ORGANISMS SEEN    Report Status 07/19/2015 FINAL   Synovial cell count + diff, w/ crystals     Status: Abnormal   Collection Time: 07/18/15 10:19 PM  Result Value Ref Range   Color, Synovial YELLOW YELLOW   Appearance-Synovial TURBID (A) CLEAR   Crystals, Fluid INTRACELLULAR MONOSODIUM URATE CRYSTALS     Comment: EXTRACELLULAR MONOSODIUM URATE CRYSTALS   WBC, Synovial 4675 (H) 0 - 200 /cu mm   Neutrophil, Synovial 95 (H) 0 - 25 %   Lymphocytes-Synovial Fld 1 0 - 20 %   Monocyte-Macrophage-Synovial Fluid 4 (L) 50 - 90 %   Eosinophils-Synovial 0 0 - 1 %  Culture, body fluid-bottle     Status: None (Preliminary result)   Collection Time: 07/18/15 10:19 PM  Result Value Ref Range   Specimen Description FLUID SYNOVIAL RIGHT KNEE    Special Requests BOTTLES DRAWN AEROBIC ONLY 10CC    Culture PENDING  Report Status PENDING   Urinalysis, Routine w reflex microscopic (not at [name])     Status: Abnormal   Collection Time: 07/18/15 10:28 PM  Result Value Ref  Range   Color, Urine YELLOW YELLOW   APPearance CLEAR CLEAR   Specific Gravity, Urine 1.009 1.005 - 1.030   pH 6.0 5.0 - 8.0   Glucose, UA NEGATIVE NEGATIVE mg/dL   Hgb urine dipstick NEGATIVE NEGATIVE   Bilirubin Urine NEGATIVE NEGATIVE   Ketones, ur NEGATIVE NEGATIVE mg/dL   Protein, ur 30 (A) NEGATIVE mg/dL   Nitrite NEGATIVE NEGATIVE   Leukocytes, UA NEGATIVE NEGATIVE  Urine microscopic-add on     Status: Abnormal   Collection Time: 07/18/15 10:28 PM  Result Value Ref Range   Squamous Epithelial / LPF 0-5 (A) NONE SEEN   WBC, UA 0-5 0 - 5 WBC/hpf   RBC / HPF 0-5 0 - 5 RBC/hpf   Bacteria, UA RARE (A) NONE SEEN  Influenza panel by PCR (type A & B, H1N1)     Status: None   Collection Time: 07/18/15 11:49 PM  Result Value Ref Range   Influenza A By PCR NEGATIVE NEGATIVE   Influenza B By PCR NEGATIVE NEGATIVE   H1N1 flu by pcr NOT DETECTED NOT DETECTED    Comment:        The Xpert Flu assay (FDA approved for nasal aspirates or washes and nasopharyngeal swab specimens), is intended as an aid in the diagnosis of influenza and should not be used as a sole basis for treatment.   CBC     Status: Abnormal   Collection Time: 07/19/15  3:10 AM  Result Value Ref Range   WBC 7.7 4.0 - 10.5 K/uL   RBC 5.20 4.22 - 5.81 MIL/uL   Hemoglobin 12.6 (L) 13.0 - 17.0 g/dL   HCT 37.5 (L) 39.0 - 52.0 %   MCV 72.1 (L) 78.0 - 100.0 fL   MCH 24.2 (L) 26.0 - 34.0 pg   MCHC 33.6 30.0 - 36.0 g/dL   RDW 13.8 11.5 - 15.5 %   Platelets 118 (L) 150 - 400 K/uL    Comment: PLATELET COUNT CONFIRMED BY SMEAR  Basic metabolic panel     Status: Abnormal   Collection Time: 07/19/15  3:10 AM  Result Value Ref Range   Sodium 140 135 - 145 mmol/L   Potassium 3.3 (L) 3.5 - 5.1 mmol/L   Chloride 110 101 - 111 mmol/L   CO2 20 (L) 22 - 32 mmol/L   Glucose, Bld 116 (H) 65 - 99 mg/dL   BUN 9 6 - 20 mg/dL   Creatinine, Ser 1.00 0.61 - 1.24 mg/dL   Calcium 7.9 (L) 8.9 - 10.3 mg/dL   GFR calc non Af Amer >60  >60 mL/min   GFR calc Af Amer >60 >60 mL/min    Comment: (NOTE) The eGFR has been calculated using the CKD EPI equation. This calculation has not been validated in all clinical situations. eGFR's persistently <60 mL/min signify possible Chronic Kidney Disease.    Anion gap 10 5 - 15  Magnesium     Status: Abnormal   Collection Time: 07/19/15  3:10 AM  Result Value Ref Range   Magnesium 1.5 (L) 1.7 - 2.4 mg/dL  Uric acid     Status: None   Collection Time: 07/19/15  8:24 AM  Result Value Ref Range   Uric Acid, Serum 6.4 4.4 - 7.6 mg/dL    Ct Abdomen Pelvis W Contrast  07/19/2015  CLINICAL DATA:  Fever and abdominal tightness. EXAM: CT ABDOMEN AND PELVIS WITH CONTRAST TECHNIQUE: Multidetector CT imaging of the abdomen and pelvis was performed using the standard protocol following bolus administration of intravenous contrast. CONTRAST:  18m ISOVUE-300 IOPAMIDOL (ISOVUE-300) INJECTION 61% COMPARISON:  None. FINDINGS: Lower chest:  No significant abnormality Hepatobiliary: There are normal appearances of the liver, gallbladder and bile ducts. Pancreas: Normal Spleen: Normal Adrenals/Urinary Tract: The adrenals and kidneys are normal in appearance. There is no urinary calculus evident. There is no hydronephrosis or ureteral dilatation. Collecting systems and ureters appear unremarkable. Stomach/Bowel: There are normal appearances of the stomach, small bowel and colon. The appendix is normal. Vascular/Lymphatic: The abdominal aorta is normal in caliber with mild atherosclerotic calcification. There are multiple nonspecific para-aortic nodes. These only measure up to a short axis of about 12 mm, but they are generous in number. Reproductive: Unremarkable Other: No acute inflammatory changes are evident in the abdomen or pelvis. There is no ascites. Musculoskeletal: No significant abnormality IMPRESSION: No acute findings are evident in the abdomen or pelvis. Nonspecific mildly prominent periaortic nodes  are present. Electronically Signed   By: DAndreas NewportM.D.   On: 07/19/2015 00:20   Dg Chest Port 1 View  07/18/2015  CLINICAL DATA:  Acute onset of fever and generalized abdominal pain. Initial encounter. EXAM: PORTABLE CHEST 1 VIEW COMPARISON:  Chest radiograph performed 04/20/2015 FINDINGS: The lungs are well-aerated and clear. There is no evidence of focal opacification, pleural effusion or pneumothorax. The cardiomediastinal silhouette is within normal limits. No acute osseous abnormalities are seen. IMPRESSION: No acute cardiopulmonary process seen. Electronically Signed   By: JGarald BaldingM.D.   On: 07/18/2015 23:08   Dg Knee Complete 4 Views Right  07/19/2015  CLINICAL DATA:  Pain and swelling in the right knee. No reported injury. History of gout. EXAM: RIGHT KNEE - COMPLETE 4+ VIEW COMPARISON:  None. FINDINGS: There is a moderate to large suprapatellar right knee joint effusion. There is apparent intra-articular gas within the suprapatellar right knee joint space. No fracture, malalignment, cortical erosions or suspicious focal osseous lesions. Moderate superior right patellar enthesophyte. 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 IMPRESSION: 1. Moderate to large suprapatellar right knee joint effusion. Apparent intra-articular gas in the suprapatellar right knee joint. Septic arthritis cannot be excluded. 2. No cortical erosions. 3. Minimal patellofemoral compartment osteoarthritis. These results will be called to the ordering clinician or representative by the Radiologist Assistant, and communication documented in the PACS or zVision Dashboard. Electronically Signed   By: JIlona SorrelM.D.   On: 07/19/2015 11:42    Review of Systems  Constitutional: Positive for fever, chills and malaise/fatigue. Negative for weight loss.  HENT: Negative.   Eyes: Negative.   Respiratory: Negative.   Cardiovascular: Negative.   Gastrointestinal: Negative.   Genitourinary: Positive  for dysuria.  Musculoskeletal: Positive for joint pain.       Right knee pain and swelling  Skin: Negative.   Neurological: Positive for dizziness.  Endo/Heme/Allergies: Negative.   Psychiatric/Behavioral: Negative.    Blood pressure 136/92, pulse 94, temperature 101 F (38.3 C), temperature source Oral, resp. rate 19, height '5\' 9"'  (1.753 m), weight 95.528 kg (210 lb 9.6 oz), SpO2 95 %. Physical Exam  Constitutional: He is oriented to person, place, and time. He appears well-developed and well-nourished.  HENT:  Head: Normocephalic and atraumatic.  Mouth/Throat: Oropharynx is clear and moist.  Eyes: Pupils are equal, round, and reactive to light.  Cardiovascular: Normal  rate.   Respiratory: Effort normal.  Musculoskeletal:  Right knee 3+ effusion and warm.   Painful range of motion.  2+ DP pulse.  Normal sensation.  Neurological: He is alert and oriented to person, place, and time.  Skin: Skin is warm and dry.  Psychiatric: He has a normal mood and affect.    Assessment/Plan: Principal Problem:   Fever Active Problems:   Gout   Hypertensive urgency   Hypokalemia   Pyrexia   Right knee pain  Today under sterile conditions patient's right knee was prepped with betadine, anesthetized with 3 cc of 1% xylocaine and aspirated of 80 cc of thick cloudy serous fluid consistent with gout.  He was injected with 6 cc of 0.5%marcaine and 80 mg of DepoMedrol.  This patient tolerated the procedure well and was improved after the procedure.  I would recommend Colchicine 0.6 mg bid and indomethacin 50 mg TID until symptoms resolve.  Shawn Stailey A. Levester Fresh, PA-C Physician Assistant for Shawn Lynch, MD [facility] [phone]  07/19/2015, 1:41 PM  [name] 07/19/2015, 1:26 PM

## 2015-07-19 NOTE — Progress Notes (Signed)
PROGRESS NOTE                                                                                                                                                                                                             Patient Demographics:    [name], is a 55 y.o. male, DOB - 1960/07/27, ZOX:[id number]  Admit date - 07/18/2015   Admitting Physician Alberteen Sam, MD  Outpatient Primary MD for the patient is No PCP Per Patient  LOS - 0  Outpatient Specialists: None  Chief Complaint  Patient presents with  . Fever  . Headache  . 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 Place him on IV Solu-Medrol, colchicine, pain control, requested orthopedics Dr. Eulah Pont to evaluate him as he might benefit from intra-articular steroid shot especially to his right knee.   Will check right knee x-Montijo and uric acid levels. Of note patient was supposed to be on allopurinol which he is noncompliant for several months.  2. Hypertensive urgency. On Norvasc and his inhibitor which will be continued, will add Coreg scheduled along with hydralazine as needed for better control. Continue pain control as well.  3. Hypokalemia and hypomagnesemia. Both replaced.  4. Noncompliance with allopurinol. Counseled to be compliant. I'm unsure if she's taking his blood pressure medications regularly  as well.    Code Status : Full  Family Communication  : None present  Disposition Plan  : Keep inpatient  Barriers For Discharge : Right knee pain and swelling  Consults  : Orthopedics Dr. Eulah Pont  Procedures  :   CT abdomen and pelvis unremarkable Right joint aspiration showing fluids with 5000 WBCs and positive intracellular sodium monourate crystals  DVT Prophylaxis  :  Lovenox    Lab Results  Component Value Date   PLT 118* 07/19/2015    Antibiotics  :    Anti-infectives    Start     Dose/Rate Route Frequency Ordered Stop   07/19/15 0600  vancomycin (VANCOCIN) IVPB 1000 mg/200 mL premix     1,000 mg 200 mL/hr over 60 Minutes Intravenous Every 8 hours 07/18/15 2149     07/19/15 0600  piperacillin-tazobactam (ZOSYN) IVPB 3.375 g     3.375 g 12.5 mL/hr over 240 Minutes Intravenous 3 times per day 07/18/15 2332     07/18/15 2145  piperacillin-tazobactam (ZOSYN) IVPB 3.375 g     3.375 g 100 mL/hr over 30 Minutes Intravenous  Once 07/18/15 2141 07/18/15 2256   07/18/15 2145  vancomycin (VANCOCIN) IVPB 1000 mg/200 mL premix     1,000 mg 200 mL/hr over 60 Minutes Intravenous  Once 07/18/15 2141 07/19/15 0046        Objective:   Filed Vitals:   07/19/15 0130 07/19/15 0145 07/19/15 0200 07/19/15 0248  BP: 169/113 170/114 168/111 166/97  Pulse: 93 97 94 94  Temp:    101 F (38.3 C)  TempSrc:    Oral  Resp:    19  Height:    5\' 9"  (1.753 m)  Weight:    95.528 kg (210 lb 9.6 oz)  SpO2:  92% 91% 95%    Wt Readings from Last 3 Encounters:  07/19/15 95.528 kg (210 lb 9.6 oz)  04/22/15 91.581 kg (201 lb 14.4 oz)  04/19/15 91.428 kg (201 lb 9 oz)     Intake/Output Summary (Last 24 hours) at 07/19/15 1020 Last data filed at 07/19/15 0700  Gross per 24 hour  Intake    770 ml  Output      0 ml  Net    770 ml     Physical Exam  Awake Alert, Oriented X 3, No new F.N deficits, Normal affect .AT,PERRAL Supple Neck,No JVD, No cervical lymphadenopathy  appriciated.  Symmetrical Chest wall movement, Good air movement bilaterally, CTAB RRR,No Gallops,Rubs or new Murmurs, No Parasternal Heave +ve B.Sounds, Abd Soft, No tenderness, No organomegaly appriciated, No rebound - guarding or rigidity. No Cyanosis, Clubbing or edema, No new Rash or bruise R knee has large effusion with tenderness but not much erythema or warmth, left knee has minimal effusion and minimal tenderness.    Data Review:    CBC  Recent Labs Lab 07/18/15 2150 07/19/15 0310  WBC 8.5 7.7  HGB 13.4 12.6*  HCT 40.4 37.5*  PLT 144* 118*  MCV 71.0* 72.1*  MCH 23.6* 24.2*  MCHC 33.2 33.6  RDW 13.4 13.8  LYMPHSABS 2.2  --   MONOABS 1.0  --   EOSABS 0.0  --   BASOSABS 0.0  --     Chemistries   Recent Labs Lab 07/18/15 2150 07/19/15 0310  NA 134* 140  K 3.0* 3.3*  CL 101 110  CO2 22 20*  GLUCOSE 106* 116*  BUN 13 9  CREATININE 1.09 1.00  CALCIUM 9.1 7.9*  MG  --  1.5*  AST 25  --   ALT 28  --   ALKPHOS 46  --   BILITOT 0.7  --    ------------------------------------------------------------------------------------------------------------------ No results for input(s): CHOL, HDL, LDLCALC, TRIG, CHOLHDL, LDLDIRECT in the last 72 hours.  No results found for: HGBA1C ------------------------------------------------------------------------------------------------------------------  Recent Labs  07/18/15 2150  TSH 0.506   ------------------------------------------------------------------------------------------------------------------ No results for input(s): VITAMINB12, FOLATE, FERRITIN,  TIBC, IRON, RETICCTPCT in the last 72 hours.  Coagulation profile No results for input(s): INR, PROTIME in the last 168 hours.  No results for input(s): DDIMER in the last 72 hours.  Cardiac Enzymes No results for input(s): CKMB, TROPONINI, MYOGLOBIN in the last 168 hours.  Invalid input(s):  CK ------------------------------------------------------------------------------------------------------------------ No results found for: BNP  Inpatient Medications  Scheduled Meds: . amLODipine  10 mg Oral Daily  . colchicine  0.6 mg Oral BID  . enoxaparin (LOVENOX) injection  40 mg Subcutaneous Q24H  . methylPREDNISolone (SOLU-MEDROL) injection  60 mg Intravenous Q24H  . naproxen  250 mg Oral BID WC  . piperacillin-tazobactam (ZOSYN)  IV  3.375 g Intravenous 3 times per day  . vancomycin  1,000 mg Intravenous Q8H   Continuous Infusions: . 0.9 % NaCl with KCl 20 mEq / L 150 mL/hr at 07/19/15 0332   PRN Meds:.acetaminophen **OR** acetaminophen, hydrALAZINE, senna-docusate  Micro Results Recent Results (from the past 240 hour(s))  Gram stain     Status: None   Collection Time: 07/18/15 10:19 PM  Result Value Ref Range Status   Specimen Description FLUID RIGHT KNEE  Final   Special Requests Normal  Final   Gram Stain   Final    ABUNDANT WBC PRESENT,BOTH PMN AND MONONUCLEAR NO ORGANISMS SEEN    Report Status 07/19/2015 FINAL  Final  Culture, body fluid-bottle     Status: None (Preliminary result)   Collection Time: 07/18/15 10:19 PM  Result Value Ref Range Status   Specimen Description FLUID SYNOVIAL RIGHT KNEE  Final   Special Requests BOTTLES DRAWN AEROBIC ONLY 10CC  Final   Culture PENDING  Incomplete   Report Status PENDING  Incomplete    Radiology Reports Ct Abdomen Pelvis W Contrast  07/19/2015  CLINICAL DATA:  Fever and abdominal tightness. EXAM: CT ABDOMEN AND PELVIS WITH CONTRAST TECHNIQUE: Multidetector CT imaging of the abdomen and pelvis was performed using the standard protocol following bolus administration of intravenous contrast. CONTRAST:  80mL ISOVUE-300 IOPAMIDOL (ISOVUE-300) INJECTION 61% COMPARISON:  None. FINDINGS: Lower chest:  No significant abnormality Hepatobiliary: There are normal appearances of the liver, gallbladder and bile ducts. Pancreas:  Normal Spleen: Normal Adrenals/Urinary Tract: The adrenals and kidneys are normal in appearance. There is no urinary calculus evident. There is no hydronephrosis or ureteral dilatation. Collecting systems and ureters appear unremarkable. Stomach/Bowel: There are normal appearances of the stomach, small bowel and colon. The appendix is normal. Vascular/Lymphatic: The abdominal aorta is normal in caliber with mild atherosclerotic calcification. There are multiple nonspecific para-aortic nodes. These only measure up to a short axis of about 12 mm, but they are generous in number. Reproductive: Unremarkable Other: No acute inflammatory changes are evident in the abdomen or pelvis. There is no ascites. Musculoskeletal: No significant abnormality IMPRESSION: No acute findings are evident in the abdomen or pelvis. Nonspecific mildly prominent periaortic nodes are present. Electronically Signed   By: Ellery Plunkaniel R Mitchell M.D.   On: 07/19/2015 00:20   Dg Chest Port 1 View  07/18/2015  CLINICAL DATA:  Acute onset of fever and generalized abdominal pain. Initial encounter. EXAM: PORTABLE CHEST 1 VIEW COMPARISON:  Chest radiograph performed 04/20/2015 FINDINGS: The lungs are well-aerated and clear. There is no evidence of focal opacification, pleural effusion or pneumothorax. The cardiomediastinal silhouette is within normal limits. No acute osseous abnormalities are seen. IMPRESSION: No acute cardiopulmonary process seen. Electronically Signed   By: Roanna RaiderJeffery  Chang M.D.   On: 07/18/2015 23:08    Time  Spent in minutes  25   Susa Raring K M.D on 07/19/2015 at 10:20 AM  Between 7am to 7pm - Pager - [phone]  After 7pm go to [email or web address] - password [facility]  [facility] -  Office  [phone]

## 2015-07-19 NOTE — Progress Notes (Signed)
Utilization review completed.  

## 2015-07-20 ENCOUNTER — Inpatient Hospital Stay (HOSPITAL_COMMUNITY): Payer: Self-pay

## 2015-07-20 DIAGNOSIS — I509 Heart failure, unspecified: Secondary | ICD-10-CM

## 2015-07-20 LAB — TROPONIN I
TROPONIN I: 0.03 ng/mL (ref <0.031)
TROPONIN I: 0.1 ng/mL — AB (ref <0.031)

## 2015-07-20 LAB — BASIC METABOLIC PANEL
ANION GAP: 10 (ref 5–15)
BUN: 14 mg/dL (ref 6–20)
CALCIUM: 8.9 mg/dL (ref 8.9–10.3)
CO2: 19 mmol/L — AB (ref 22–32)
CREATININE: 1.02 mg/dL (ref 0.61–1.24)
Chloride: 111 mmol/L (ref 101–111)
Glucose, Bld: 117 mg/dL — ABNORMAL HIGH (ref 65–99)
Potassium: 5.6 mmol/L — ABNORMAL HIGH (ref 3.5–5.1)
Sodium: 140 mmol/L (ref 135–145)

## 2015-07-20 LAB — ECHOCARDIOGRAM COMPLETE
HEIGHTINCHES: 69 in
Weight: 3369.6 oz

## 2015-07-20 LAB — CBC
HEMATOCRIT: 39.4 % (ref 39.0–52.0)
Hemoglobin: 13.2 g/dL (ref 13.0–17.0)
MCH: 23.6 pg — ABNORMAL LOW (ref 26.0–34.0)
MCHC: 33.5 g/dL (ref 30.0–36.0)
MCV: 70.4 fL — ABNORMAL LOW (ref 78.0–100.0)
PLATELETS: 147 10*3/uL — AB (ref 150–400)
RBC: 5.6 MIL/uL (ref 4.22–5.81)
RDW: 13.5 % (ref 11.5–15.5)
WBC: 10.1 10*3/uL (ref 4.0–10.5)

## 2015-07-20 LAB — URINE CULTURE
Culture: NO GROWTH
SPECIAL REQUESTS: NORMAL

## 2015-07-20 LAB — MAGNESIUM: MAGNESIUM: 2.5 mg/dL — AB (ref 1.7–2.4)

## 2015-07-20 MED ORDER — HYDRALAZINE HCL 20 MG/ML IJ SOLN
10.0000 mg | Freq: Once | INTRAMUSCULAR | Status: AC
  Administered 2015-07-20: 10 mg via INTRAVENOUS
  Filled 2015-07-20: qty 1

## 2015-07-20 MED ORDER — HYDRALAZINE HCL 20 MG/ML IJ SOLN
10.0000 mg | Freq: Four times a day (QID) | INTRAMUSCULAR | Status: DC | PRN
  Administered 2015-07-20: 10 mg via INTRAVENOUS
  Filled 2015-07-20: qty 1

## 2015-07-20 MED ORDER — NITROGLYCERIN 0.4 MG SL SUBL
0.4000 mg | SUBLINGUAL_TABLET | SUBLINGUAL | Status: DC | PRN
  Administered 2015-07-20: 0.4 mg via SUBLINGUAL

## 2015-07-20 MED ORDER — TRAMADOL HCL 50 MG PO TABS
50.0000 mg | ORAL_TABLET | Freq: Once | ORAL | Status: AC
  Administered 2015-07-20: 50 mg via ORAL
  Filled 2015-07-20: qty 1

## 2015-07-20 MED ORDER — SODIUM CHLORIDE 0.9 % IV BOLUS (SEPSIS): 500.0000 mL | Freq: Once | INTRAVENOUS | Status: DC

## 2015-07-20 MED ORDER — ALBUTEROL SULFATE (2.5 MG/3ML) 0.083% IN NEBU
INHALATION_SOLUTION | RESPIRATORY_TRACT | Status: AC
  Administered 2015-07-20: 2.5 mg
  Filled 2015-07-20: qty 3

## 2015-07-20 MED ORDER — NITROGLYCERIN 0.4 MG SL SUBL
SUBLINGUAL_TABLET | SUBLINGUAL | Status: AC
  Filled 2015-07-20: qty 1

## 2015-07-20 MED ORDER — FUROSEMIDE 10 MG/ML IJ SOLN
20.0000 mg | Freq: Once | INTRAMUSCULAR | Status: AC
  Administered 2015-07-20: 20 mg via INTRAVENOUS
  Filled 2015-07-20: qty 2

## 2015-07-20 MED ORDER — SODIUM POLYSTYRENE SULFONATE 15 GM/60ML PO SUSP
15.0000 g | Freq: Once | ORAL | Status: AC
  Administered 2015-07-20: 15 g via ORAL
  Filled 2015-07-20: qty 60

## 2015-07-20 MED ORDER — LORATADINE 10 MG PO TABS
10.0000 mg | ORAL_TABLET | Freq: Every day | ORAL | Status: DC
  Administered 2015-07-20 – 2015-07-21 (×2): 10 mg via ORAL
  Filled 2015-07-20 (×2): qty 1

## 2015-07-20 MED ORDER — CARVEDILOL 6.25 MG PO TABS
6.2500 mg | ORAL_TABLET | Freq: Once | ORAL | Status: AC
  Administered 2015-07-20: 6.25 mg via ORAL
  Filled 2015-07-20: qty 1

## 2015-07-20 MED ORDER — CARVEDILOL 12.5 MG PO TABS
12.5000 mg | ORAL_TABLET | Freq: Two times a day (BID) | ORAL | Status: DC
  Administered 2015-07-20 – 2015-07-21 (×2): 12.5 mg via ORAL
  Filled 2015-07-20 (×2): qty 1

## 2015-07-20 MED ORDER — HYDRALAZINE HCL 50 MG PO TABS
100.0000 mg | ORAL_TABLET | Freq: Three times a day (TID) | ORAL | Status: DC
  Administered 2015-07-20 – 2015-07-22 (×8): 100 mg via ORAL
  Filled 2015-07-20 (×8): qty 2

## 2015-07-20 MED ORDER — HYDRALAZINE HCL 20 MG/ML IJ SOLN: 20.0000 mg | Freq: Four times a day (QID) | INTRAMUSCULAR | Status: DC | PRN

## 2015-07-20 MED ORDER — ASPIRIN 81 MG PO CHEW
81.0000 mg | CHEWABLE_TABLET | Freq: Every day | ORAL | Status: DC
  Administered 2015-07-20 – 2015-07-21 (×2): 81 mg via ORAL
  Filled 2015-07-20 (×2): qty 1

## 2015-07-20 NOTE — Significant Event (Signed)
Rapid Response Event Note    Called by floor RN for pt with sudden onset SOB and hypertension 229/127. Marland Kitchen.  Pt had received hydralazine 10 mg at 0549.  Overview: Time Called: 0630 Arrival Time: 0632 Event Type: Respiratory, Cardiac, Other (Comment) (hypertension)  Initial Focused Assessment:  Upon arrival to floor pt in mild respiratory distress, sitting up in bed complaining he is  Unable to take a deep breath. EW noted in RUL and diminished Breath sounds in bilateral bases  L >R.  RR 22 with O2 sats 97% on room air.    C/o midsternal CP, nonradiating and constant, rated 4/10. BP 195/125  HR 114 .  Pt also c/o HA pounding, and constant rated 10/10.   Interventions:  Albuterol 2.5 mg given by Luisa HartPatrick, RT   With improvement in aeration,    Stat CXR , Placed on 4l [place]., 12 lead EKG. ST, no acute findings, Apresoline 10 mg per Lenny Pastelom Callahan, NP, Ntg sl x 1 with relief of CP, Ultram 50 mg po for HA   Handoff report  to Glen LynJosephine, Charity fundraiserN.  Will continue to monitor Event Summary: Name of Physician Notified: Larose Kellsom Calllahan, NP at [phone]    at    Outcome: Stayed in room and stabalized     Micaila Ziemba, [place] K

## 2015-07-20 NOTE — Progress Notes (Signed)
PROGRESS NOTE                                                                                                                                                                                                             Patient Demographics:    [name], is a 55 y.o. male, DOB - 1960-07-28, GEX:[id number]  Admit date - 07/18/2015   Admitting Physician Alberteen Samhristopher P Danford, MD  Outpatient Primary MD for the patient is No PCP Per Patient  LOS - 1  Outpatient Specialists: None  Chief Complaint  Patient presents with  . Fever  . Headache  . Leg Swelling       Brief Narrative     Subjective:    Shawn LeydenEugene Zuidema today has, No headache, No chest pain, No abdominal pain - No Nausea, No new weakness tingling or numbness, No Cough - SOB. Feels 100% better and symptom-free.   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 Renaye Rakersim Murphy orthopedics and received her right knee intra-articular steroid injection on 07/19/2015, he is much improved and close to his baseline. Antibiotics will be stopped. Fevers likely due to systemic gout. Uric acid levels were stable due to consumption and falsely low, right knee x-Arcos on acute.  2. Hypertensive urgency. Placed on Norvasc, Coreg and hydralazine scheduled with as needed IV hydralazine monitor blood pressure. Stop NSAIDs.  3. Hyperkalemia. Kayexalate and monitor.  4. Noncompliance with allopurinol. Counseled to be compliant. I'm unsure if she's taking his blood pressure medications regularly as well.  5. Chest pain morning of 07/20/2015. Likely due to poorly controlled blood pressure.  EKG nonacute with nonspecific changes, cycle troponin, blood pressure medications adjusted, completely pain-free, check baseline echogram to evaluate wall motion and EF. Aspirin added as well.    Code Status : Full  Family Communication  : None present  Disposition Plan  : Keep inpatient  Barriers For Discharge : Right knee pain and swelling  Consults  : Orthopedics Dr. Eulah PontMurphy  Procedures  :   CT abdomen and pelvis unremarkable Right joint aspiration showing fluids with 5000 WBCs and  positive intracellular sodium monourate crystals  DVT Prophylaxis  :  Lovenox    Lab Results  Component Value Date   PLT 147* 07/20/2015    Antibiotics  :    Anti-infectives    Start     Dose/Rate Route Frequency Ordered Stop   07/19/15 0600  vancomycin (VANCOCIN) IVPB 1000 mg/200 mL premix  Status:  Discontinued     1,000 mg 200 mL/hr over 60 Minutes Intravenous Every 8 hours 07/18/15 2149 07/20/15 0854   07/19/15 0600  piperacillin-tazobactam (ZOSYN) IVPB 3.375 g  Status:  Discontinued     3.375 g 12.5 mL/hr over 240 Minutes Intravenous 3 times per day 07/18/15 2332 07/20/15 0854   07/18/15 2145  piperacillin-tazobactam (ZOSYN) IVPB 3.375 g     3.375 g 100 mL/hr over 30 Minutes Intravenous  Once 07/18/15 2141 07/18/15 2256   07/18/15 2145  vancomycin (VANCOCIN) IVPB 1000 mg/200 mL premix     1,000 mg 200 mL/hr over 60 Minutes Intravenous  Once 07/18/15 2141 07/19/15 0046        Objective:   Filed Vitals:   07/20/15 0654 07/20/15 0701 07/20/15 0705 07/20/15 0709  BP: 176/92 159/91 163/85 167/90  Pulse: 104 108 103 109  Temp:      TempSrc:      Resp:      Height:      Weight:      SpO2:  99%      Wt Readings from Last 3 Encounters:  07/19/15 95.528 kg (210 lb 9.6 oz)  04/22/15 91.581 kg (201 lb 14.4 oz)  04/19/15 91.428 kg (201 lb 9 oz)     Intake/Output Summary (Last 24 hours) at 07/20/15 0932 Last data filed at 07/20/15 0300  Gross per 24 hour  Intake   2695 ml    Output    450 ml  Net   2245 ml     Physical Exam  Awake Alert, Oriented X 3, No new F.N deficits, Normal affect [employer].AT,PERRAL Supple Neck,No JVD, No cervical lymphadenopathy appriciated.  Symmetrical Chest wall movement, Good air movement bilaterally, CTAB RRR,No Gallops,Rubs or new Murmurs, No Parasternal Heave +ve B.Sounds, Abd Soft, No tenderness, No organomegaly appriciated, No rebound - guarding or rigidity. No Cyanosis, Clubbing or edema, No new Rash or bruise R knee with small effusion with much improved tenderness, good range of motion, no erythema or warmth, left knee with minimal effusion.       Data Review:    CBC  Recent Labs Lab 07/18/15 2150 07/19/15 0310 07/20/15 0312  WBC 8.5 7.7 10.1  HGB 13.4 12.6* 13.2  HCT 40.4 37.5* 39.4  PLT 144* 118* 147*  MCV 71.0* 72.1* 70.4*  MCH 23.6* 24.2* 23.6*  MCHC 33.2 33.6 33.5  RDW 13.4 13.8 13.5  LYMPHSABS 2.2  --   --   MONOABS 1.0  --   --   EOSABS 0.0  --   --   BASOSABS 0.0  --   --     Chemistries   Recent Labs Lab 07/18/15 2150 07/19/15 0310 07/20/15 0312  NA 134* 140 140  K 3.0* 3.3* 5.6*  CL 101 110 111  CO2 22 20* 19*  GLUCOSE 106* 116* 117*  BUN CREATININE 1.09 1.00 1.02  CALCIUM 9.1 7.9* 8.9  MG  --  1.5* 2.5*  AST 25  --   --   ALT 28  --   --   ALKPHOS 46  --   --  BILITOT 0.7  --   --    ------------------------------------------------------------------------------------------------------------------ No results for input(s): CHOL, HDL, LDLCALC, TRIG, CHOLHDL, LDLDIRECT in the last 72 hours.  No results found for: HGBA1C ------------------------------------------------------------------------------------------------------------------  Recent Labs  07/18/15 2150  TSH 0.506   ------------------------------------------------------------------------------------------------------------------ No results for input(s): VITAMINB12, FOLATE, FERRITIN, TIBC, IRON, RETICCTPCT in  the last 72 hours.  Coagulation profile No results for input(s): INR, PROTIME in the last 168 hours.  No results for input(s): DDIMER in the last 72 hours.  Cardiac Enzymes No results for input(s): CKMB, TROPONINI, MYOGLOBIN in the last 168 hours.  Invalid input(s): CK ------------------------------------------------------------------------------------------------------------------ No results found for: BNP  Inpatient Medications  Scheduled Meds: . amLODipine  10 mg Oral Daily  . aspirin  81 mg Oral Daily  . carvedilol  6.25 mg Oral BID WC  . colchicine  0.6 mg Oral BID  . enoxaparin (LOVENOX) injection  40 mg Subcutaneous Q24H  . hydrALAZINE  100 mg Oral 3 times per day  . methylPREDNISolone (SOLU-MEDROL) injection  60 mg Intravenous Q24H  . sodium chloride  500 mL Intravenous Once  . sodium polystyrene  15 g Oral Once   Continuous Infusions:   PRN Meds:.acetaminophen **OR** [DISCONTINUED] acetaminophen, hydrALAZINE, nitroGLYCERIN, senna-docusate  Micro Results Recent Results (from the past 240 hour(s))  Blood Culture (routine x 2)     Status: None (Preliminary result)   Collection Time: 07/18/15  9:50 PM  Result Value Ref Range Status   Specimen Description BLOOD RIGHT ARM  Final   Special Requests BOTTLES DRAWN AEROBIC AND ANAEROBIC 5CC  Final   Culture NO GROWTH < 24 HOURS  Final   Report Status PENDING  Incomplete  Gram stain     Status: None   Collection Time: 07/18/15 10:19 PM  Result Value Ref Range Status   Specimen Description FLUID RIGHT KNEE  Final   Special Requests Normal  Final   Gram Stain   Final    ABUNDANT WBC PRESENT,BOTH PMN AND MONONUCLEAR NO ORGANISMS SEEN    Report Status 07/19/2015 FINAL  Final  Culture, body fluid-bottle     Status: None (Preliminary result)   Collection Time: 07/18/15 10:19 PM  Result Value Ref Range Status   Specimen Description FLUID SYNOVIAL RIGHT KNEE  Final   Special Requests BOTTLES DRAWN AEROBIC ONLY 10CC  Final     Culture PENDING  Incomplete   Report Status PENDING  Incomplete  Urine culture     Status: None (Preliminary result)   Collection Time: 07/18/15 10:28 PM  Result Value Ref Range Status   Specimen Description URINE, CLEAN CATCH  Final   Special Requests Normal  Final   Culture NO GROWTH < 24 HOURS  Final   Report Status PENDING  Incomplete  Blood Culture (routine x 2)     Status: None (Preliminary result)   Collection Time: 07/18/15 10:51 PM  Result Value Ref Range Status   Specimen Description BLOOD RIGHT HAND  Final   Special Requests BOTTLES DRAWN AEROBIC AND ANAEROBIC 5CC  Final   Culture NO GROWTH < 24 HOURS  Final   Report Status PENDING  Incomplete    Radiology Reports Ct Abdomen Pelvis W Contrast  07/19/2015  CLINICAL DATA:  Fever and abdominal tightness. EXAM: CT ABDOMEN AND PELVIS WITH CONTRAST TECHNIQUE: Multidetector CT imaging of the abdomen and pelvis was performed using the standard protocol following bolus administration of intravenous contrast. CONTRAST:  80mL ISOVUE-300 IOPAMIDOL (ISOVUE-300) INJECTION 61% COMPARISON:  None. FINDINGS: Lower chest:  No significant abnormality Hepatobiliary: There are normal appearances of the liver, gallbladder and bile ducts. Pancreas: Normal Spleen: Normal Adrenals/Urinary Tract: The adrenals and kidneys are normal in appearance. There is no urinary calculus evident. There is no hydronephrosis or ureteral dilatation. Collecting systems and ureters appear unremarkable. Stomach/Bowel: There are normal appearances of the stomach, small bowel and colon. The appendix is normal. Vascular/Lymphatic: The abdominal aorta is normal in caliber with mild atherosclerotic calcification. There are multiple nonspecific para-aortic nodes. These only measure up to a short axis of about 12 mm, but they are generous in number. Reproductive: Unremarkable Other: No acute inflammatory changes are evident in the abdomen or pelvis. There is no ascites.  Musculoskeletal: No significant abnormality IMPRESSION: No acute findings are evident in the abdomen or pelvis. Nonspecific mildly prominent periaortic nodes are present. Electronically Signed   By: Ellery Plunk M.D.   On: 07/19/2015 00:20   Dg Chest Port 1 View  07/20/2015  CLINICAL DATA:  Shortness of breath. EXAM: PORTABLE CHEST 1 VIEW COMPARISON:  07/18/2015 and 04/20/2015 FINDINGS: There is suggestion of new cardiomegaly while this may be accentuated by the AP portable technique. There is slight distention of the azygos vein in the pulmonary vascularity is slightly more prominent than on the prior exam but within normal limits. No infiltrates or effusions.  Bones are normal. IMPRESSION: Slight pulmonary vascular congestion. Possible new slight cardiomegaly. Electronically Signed   By: Francene Boyers M.D.   On: 07/20/2015 07:17   Dg Chest Port 1 View  07/18/2015  CLINICAL DATA:  Acute onset of fever and generalized abdominal pain. Initial encounter. EXAM: PORTABLE CHEST 1 VIEW COMPARISON:  Chest radiograph performed 04/20/2015 FINDINGS: The lungs are well-aerated and clear. There is no evidence of focal opacification, pleural effusion or pneumothorax. The cardiomediastinal silhouette is within normal limits. No acute osseous abnormalities are seen. IMPRESSION: No acute cardiopulmonary process seen. Electronically Signed   By: Roanna Raider M.D.   On: 07/18/2015 23:08   Dg Knee Complete 4 Views Right  07/19/2015  CLINICAL DATA:  Pain and swelling in the right knee. No reported injury. History of gout. EXAM: RIGHT KNEE - COMPLETE 4+ VIEW COMPARISON:  None. FINDINGS: There is a moderate to large suprapatellar right knee joint effusion. There is apparent intra-articular gas within the suprapatellar right knee joint space. No fracture, malalignment, cortical erosions or suspicious focal osseous lesions. Moderate superior right patellar enthesophyte. 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 IMPRESSION: 1. Moderate to large suprapatellar right knee joint effusion. Apparent intra-articular gas in the suprapatellar right knee joint. Septic arthritis cannot be excluded. 2. No cortical erosions. 3. Minimal patellofemoral compartment osteoarthritis. These results will be called to the ordering clinician or representative by the Radiologist Assistant, and communication documented in the PACS or zVision Dashboard. Electronically Signed   By: Delbert Phenix M.D.   On: 07/19/2015 11:42    Time Spent in minutes  25   Ana Woodroof K M.D on 07/20/2015 at 9:32 AM  Between 7am to 7pm - Pager - [phone]  After 7pm go to [email or web address] - password [facility]  [facility] -  Office  [phone]

## 2015-07-20 NOTE — Progress Notes (Signed)
  Echocardiogram 2D Echocardiogram has been performed.  [name], [name] 07/20/2015, 2:10 PM

## 2015-07-20 NOTE — Progress Notes (Signed)
Pt c/o SOB. Denies CP. VS @0627  was BP=229/127, HR=118 and Sat 97% at RA. Elevated pt's HOB. Teach slow deep breathing with no relief. [place] at 4 L. Made Lenny Pastelom Callahan RN aware. Called RR RN.

## 2015-07-20 NOTE — Progress Notes (Signed)
Stopped NS bolus and all IV fluids per MD order after telling him about rapid response event earlier this AM.

## 2015-07-20 NOTE — Progress Notes (Signed)
Pt reported relief of chest pain. Nitro was given x 1. Given Tramadol 50 mg x 1 for headache. BP=159/91. HR down to 109. Reported to [employer] RN. Will continue to monitor pt.

## 2015-07-21 ENCOUNTER — Other Ambulatory Visit: Payer: Self-pay

## 2015-07-21 ENCOUNTER — Encounter (HOSPITAL_COMMUNITY): Payer: Self-pay | Admitting: Physician Assistant

## 2015-07-21 LAB — POTASSIUM: Potassium: 3.7 mmol/L (ref 3.5–5.1)

## 2015-07-21 LAB — TROPONIN I: TROPONIN I: 0.1 ng/mL — AB (ref <0.031)

## 2015-07-21 MED ORDER — ACETAMINOPHEN 325 MG PO TABS: 650.0000 mg | ORAL_TABLET | Freq: Four times a day (QID) | ORAL | Status: DC | PRN

## 2015-07-21 MED ORDER — PANTOPRAZOLE SODIUM 40 MG PO TBEC: 40.0000 mg | DELAYED_RELEASE_TABLET | Freq: Every day | ORAL | Status: DC

## 2015-07-21 MED ORDER — METHYLPREDNISOLONE 4 MG PO TBPK: ORAL_TABLET | ORAL | Status: DC

## 2015-07-21 MED ORDER — AMLODIPINE BESYLATE 10 MG PO TABS: 10.0000 mg | ORAL_TABLET | Freq: Every day | ORAL | Status: DC

## 2015-07-21 MED ORDER — ASPIRIN 81 MG PO CHEW: 81.0000 mg | CHEWABLE_TABLET | Freq: Every day | ORAL | Status: DC

## 2015-07-21 MED ORDER — CARVEDILOL 12.5 MG PO TABS: 12.5000 mg | ORAL_TABLET | Freq: Two times a day (BID) | ORAL | Status: DC

## 2015-07-21 MED ORDER — CARVEDILOL 25 MG PO TABS
25.0000 mg | ORAL_TABLET | Freq: Two times a day (BID) | ORAL | Status: DC
  Administered 2015-07-21 – 2015-07-22 (×2): 25 mg via ORAL
  Filled 2015-07-21 (×2): qty 1

## 2015-07-21 MED ORDER — LISINOPRIL 20 MG PO TABS: 20.0000 mg | ORAL_TABLET | Freq: Once | ORAL | Status: DC

## 2015-07-21 MED ORDER — SODIUM CHLORIDE 0.9 % IV SOLN: INTRAVENOUS | Status: DC

## 2015-07-21 MED ORDER — CARVEDILOL 12.5 MG PO TABS
12.5000 mg | ORAL_TABLET | Freq: Once | ORAL | Status: AC
  Administered 2015-07-21: 12.5 mg via ORAL
  Filled 2015-07-21: qty 1

## 2015-07-21 MED ORDER — COLCHICINE 0.6 MG PO TABS: ORAL_TABLET | ORAL | Status: DC

## 2015-07-21 MED ORDER — HYDRALAZINE HCL 50 MG PO TABS: 50.0000 mg | ORAL_TABLET | Freq: Three times a day (TID) | ORAL | Status: DC

## 2015-07-21 NOTE — Progress Notes (Signed)
PROGRESS NOTE                                                                                                                                                                                                             Patient Demographics:    [name], is a 55 y.o. male, DOB - September 04, 1960, ZOX:[id number]  Admit date - 07/18/2015   Admitting Physician Alberteen Sam, MD  Outpatient Primary MD for the patient is No PCP Per Patient  LOS - 2  Outpatient Specialists: None  Chief Complaint  Patient presents with  . Fever  . Headache  . Leg Swelling       Brief Narrative     Subjective:    [name] today has, No headache, No chest pain, No abdominal pain - No Nausea, No new weakness tingling or numbness, No Cough - SOB. Feels 100% better and symptom-free.   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 Renaye Rakers orthopedics and received her right knee intra-articular steroid injection on 07/19/2015, he is much improved and close to his baseline. All systemic symptoms and joint pains have resolved with steroids and colchicine, antibiotics were stopped on 07/20/2015 morning patient remains afebrile. Fevers likely due to systemic gout. Uric acid levels were stable due to consumption and falsely low, right knee x-Boeh on acute.  2. Hypertensive urgency. Placed on Norvasc, Coreg and hydralazine scheduled with as needed IV hydralazine monitor blood pressure. Stop NSAIDs.  3. Hyperkalemia. Kayexalate and monitor.  4. Noncompliance with allopurinol. Counseled to be compliant. I'm unsure if she's taking his blood  pressure medications regularly as well.  5. Chest pain morning of 07/20/2015. Likely due to poorly controlled blood pressure. EKG nonacute with nonspecific changes, Pollen and non-ACS pattern, blood pressure medications adjusted, completely pain-free, blood pressure stable with new regimen, chest pain-free.  6. Chronic Diastolic dysfunction due to uncontrolled hypertension EF 60%. Stable.  7. Mild-to-moderate aortic stenosis with a mobile mass noted on echogram, cardiology called may require TEE. Blood cultures negative, no risks for Endocarditis, has never done IV drug use or any  history of sepsis or bacteremia, should've this is myxoma, we'll defer management to cardiology.   Code Status : Full  Family Communication  : None present  Disposition Plan  : Keep inpatient  Barriers For Discharge : Right knee pain and swelling  Consults  : Orthopedics Dr. Eulah Pont  Procedures  :   CT abdomen and pelvis unremarkable.  Right joint aspiration showing fluids with 5000 WBCs and positive intracellular sodium monourate crystals  TTE  - Left ventricle: The cavity size was normal. Wall thickness was increased in a pattern of moderate LVH. Systolic function was normal. The estimated ejection fraction was in the range of 55% to 60%. - Aortic valve: There was moderate stenosis. There was trivial regurgitation. Valve area (VTI): 1.79 cm^2. Valve area (Vmax): 1.5 cm^2. Valve area (Vmean): 1.55 cm^2. 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 There was mild regurgitation. - Left atrium: The atrium was moderately to severely dilated. - Atrial septum: No defect or patent foramen ovale was identified. - Pulmonary arteries: PA peak pressure: 50 mm Hg (S).   DVT Prophylaxis  :  Lovenox    Lab Results  Component  Value Date   PLT 147* 07/20/2015    Antibiotics  :    Anti-infectives    Start     Dose/Rate Route Frequency Ordered Stop   07/19/15 0600  vancomycin (VANCOCIN) IVPB 1000 mg/200 mL premix  Status:  Discontinued     1,000 mg 200 mL/hr over 60 Minutes Intravenous Every 8 hours 07/18/15 2149 07/20/15 0854   07/19/15 0600  piperacillin-tazobactam (ZOSYN) IVPB 3.375 g  Status:  Discontinued     3.375 g 12.5 mL/hr over 240 Minutes Intravenous 3 times per day 07/18/15 2332 07/20/15 0854   07/18/15 2145  piperacillin-tazobactam (ZOSYN) IVPB 3.375 g     3.375 g 100 mL/hr over 30 Minutes Intravenous  Once 07/18/15 2141 07/18/15 2256   07/18/15 2145  vancomycin (VANCOCIN) IVPB 1000 mg/200 mL premix     1,000 mg 200 mL/hr over 60 Minutes Intravenous  Once 07/18/15 2141 07/19/15 0046        Objective:   Filed Vitals:   07/20/15 2209 07/21/15 0548 07/21/15 0623 07/21/15 0846  BP: 150/105 155/94 155/94 145/72  Pulse: 93 92  91  Temp: 98 F (36.7 C) 99.2 F (37.3 C)    TempSrc: Oral Oral    Resp: 19 18    Height:      Weight:      SpO2: 100% 97%      Wt Readings from Last 3 Encounters:  07/19/15 95.528 kg (210 lb 9.6 oz)  04/22/15 91.581 kg (201 lb 14.4 oz)  04/19/15 91.428 kg (201 lb 9 oz)     Intake/Output Summary (Last 24 hours) at 07/21/15 1421 Last data filed at 07/21/15 0500  Gross per 24 hour  Intake      0 ml  Output    900 ml  Net   -900 ml     Physical Exam  Awake Alert, Oriented X 3, No new F.N deficits, Normal affect [place].AT,PERRAL Supple Neck,No JVD, No cervical lymphadenopathy appriciated.  Symmetrical Chest wall movement, Good air movement bilaterally, CTAB RRR,No Gallops,Rubs or new Murmurs, No Parasternal Heave +ve B.Sounds, Abd Soft, No tenderness, No organomegaly appriciated, No rebound - guarding or rigidity. No Cyanosis, Clubbing  or edema, No new Rash or bruise R knee with small effusion with much improved tenderness, good range of motion, no  erythema or warmth, left knee with minimal effusion.       Data Review:    CBC  Recent Labs Lab 07/18/15 2150 07/19/15 0310 07/20/15 0312  WBC 8.5 7.7 10.1  HGB 13.4 12.6* 13.2  HCT 40.4 37.5* 39.4  PLT 144* 118* 147*  MCV 71.0* 72.1* 70.4*  MCH 23.6* 24.2* 23.6*  MCHC 33.2 33.6 33.5  RDW 13.4 13.8 13.5  LYMPHSABS 2.2  --   --   MONOABS 1.0  --   --   EOSABS 0.0  --   --   BASOSABS 0.0  --   --     Chemistries   Recent Labs Lab 07/18/15 2150 07/19/15 0310 07/20/15 0312 07/21/15 0535  NA 134* 140 140  --   K 3.0* 3.3* 5.6* 3.7  CL 101 110 111  --   CO2 22 20* 19*  --   GLUCOSE 106* 116* 117*  --   BUN --   CREATININE 1.09 1.00 1.02  --   CALCIUM 9.1 7.9* 8.9  --   MG  --  1.5* 2.5*  --   AST 25  --   --   --   ALT 28  --   --   --   ALKPHOS 46  --   --   --   BILITOT 0.7  --   --   --    ------------------------------------------------------------------------------------------------------------------ No results for input(s): CHOL, HDL, LDLCALC, TRIG, CHOLHDL, LDLDIRECT in the last 72 hours.  No results found for: HGBA1C ------------------------------------------------------------------------------------------------------------------  Recent Labs  07/18/15 2150  TSH 0.506   ------------------------------------------------------------------------------------------------------------------ No results for input(s): VITAMINB12, FOLATE, FERRITIN, TIBC, IRON, RETICCTPCT in the last 72 hours.  Coagulation profile No results for input(s): INR, PROTIME in the last 168 hours.  No results for input(s): DDIMER in the last 72 hours.  Cardiac Enzymes  Recent Labs Lab 07/20/15 0904 07/20/15 1534 07/21/15 1032  TROPONINI 0.03 0.10* 0.10*   ------------------------------------------------------------------------------------------------------------------ No results found for: BNP  Inpatient Medications  Scheduled Meds: . amLODipine  10 mg Oral  Daily  . aspirin  81 mg Oral Daily  . carvedilol  12.5 mg Oral Once  . carvedilol  25 mg Oral BID WC  . colchicine  0.6 mg Oral BID  . enoxaparin (LOVENOX) injection  40 mg Subcutaneous Q24H  . hydrALAZINE  100 mg Oral 3 times per day  . loratadine  10 mg Oral Daily  . methylPREDNISolone (SOLU-MEDROL) injection  60 mg Intravenous Q24H  . sodium chloride  500 mL Intravenous Once   Continuous Infusions:   PRN Meds:.acetaminophen **OR** [DISCONTINUED] acetaminophen, hydrALAZINE, nitroGLYCERIN, senna-docusate  Micro Results Recent Results (from the past 240 hour(s))  Blood Culture (routine x 2)     Status: None (Preliminary result)   Collection Time: 07/18/15  9:50 PM  Result Value Ref Range Status   Specimen Description BLOOD RIGHT ARM  Final   Special Requests BOTTLES DRAWN AEROBIC AND ANAEROBIC 5CC  Final   Culture NO GROWTH 3 DAYS  Final   Report Status PENDING  Incomplete  Gram stain     Status: None   Collection Time: 07/18/15 10:19 PM  Result Value Ref Range Status   Specimen Description FLUID RIGHT KNEE  Final   Special Requests Normal  Final   Gram Stain   Final  ABUNDANT WBC PRESENT,BOTH PMN AND MONONUCLEAR NO ORGANISMS SEEN    Report Status 07/19/2015 FINAL  Final  Culture, body fluid-bottle     Status: None (Preliminary result)   Collection Time: 07/18/15 10:19 PM  Result Value Ref Range Status   Specimen Description FLUID SYNOVIAL RIGHT KNEE  Final   Special Requests BOTTLES DRAWN AEROBIC ONLY 10CC  Final   Culture NO GROWTH 2 DAYS  Final   Report Status PENDING  Incomplete  Urine culture     Status: None   Collection Time: 07/18/15 10:28 PM  Result Value Ref Range Status   Specimen Description URINE, CLEAN CATCH  Final   Special Requests Normal  Final   Culture NO GROWTH 2 DAYS  Final   Report Status 07/20/2015 FINAL  Final  Blood Culture (routine x 2)     Status: None (Preliminary result)   Collection Time: 07/18/15 10:51 PM  Result Value Ref Range  Status   Specimen Description BLOOD RIGHT HAND  Final   Special Requests BOTTLES DRAWN AEROBIC AND ANAEROBIC 5CC  Final   Culture NO GROWTH 3 DAYS  Final   Report Status PENDING  Incomplete    Radiology Reports Ct Abdomen Pelvis W Contrast  07/19/2015  CLINICAL DATA:  Fever and abdominal tightness. EXAM: CT ABDOMEN AND PELVIS WITH CONTRAST TECHNIQUE: Multidetector CT imaging of the abdomen and pelvis was performed using the standard protocol following bolus administration of intravenous contrast. CONTRAST:  80mL ISOVUE-300 IOPAMIDOL (ISOVUE-300) INJECTION 61% COMPARISON:  None. FINDINGS: Lower chest:  No significant abnormality Hepatobiliary: There are normal appearances of the liver, gallbladder and bile ducts. Pancreas: Normal Spleen: Normal Adrenals/Urinary Tract: The adrenals and kidneys are normal in appearance. There is no urinary calculus evident. There is no hydronephrosis or ureteral dilatation. Collecting systems and ureters appear unremarkable. Stomach/Bowel: There are normal appearances of the stomach, small bowel and colon. The appendix is normal. Vascular/Lymphatic: The abdominal aorta is normal in caliber with mild atherosclerotic calcification. There are multiple nonspecific para-aortic nodes. These only measure up to a short axis of about 12 mm, but they are generous in number. Reproductive: Unremarkable Other: No acute inflammatory changes are evident in the abdomen or pelvis. There is no ascites. Musculoskeletal: No significant abnormality IMPRESSION: No acute findings are evident in the abdomen or pelvis. Nonspecific mildly prominent periaortic nodes are present. Electronically Signed   By: Ellery Plunkaniel R Mitchell M.D.   On: 07/19/2015 00:20   Dg Chest Port 1 View  07/20/2015  CLINICAL DATA:  Shortness of breath. EXAM: PORTABLE CHEST 1 VIEW COMPARISON:  07/18/2015 and 04/20/2015 FINDINGS: There is suggestion of new cardiomegaly while this may be accentuated by the AP portable technique.  There is slight distention of the azygos vein in the pulmonary vascularity is slightly more prominent than on the prior exam but within normal limits. No infiltrates or effusions.  Bones are normal. IMPRESSION: Slight pulmonary vascular congestion. Possible new slight cardiomegaly. Electronically Signed   By: Francene BoyersJames  Maxwell M.D.   On: 07/20/2015 07:17   Dg Chest Port 1 View  07/18/2015  CLINICAL DATA:  Acute onset of fever and generalized abdominal pain. Initial encounter. EXAM: PORTABLE CHEST 1 VIEW COMPARISON:  Chest radiograph performed 04/20/2015 FINDINGS: The lungs are well-aerated and clear. There is no evidence of focal opacification, pleural effusion or pneumothorax. The cardiomediastinal silhouette is within normal limits. No acute osseous abnormalities are seen. IMPRESSION: No acute cardiopulmonary process seen. Electronically Signed   By: Roanna RaiderJeffery  Chang M.D.   On:  07/18/2015 23:08   Dg Knee Complete 4 Views Right  07/19/2015  CLINICAL DATA:  Pain and swelling in the right knee. No reported injury. History of gout. EXAM: RIGHT KNEE - COMPLETE 4+ VIEW COMPARISON:  None. FINDINGS: There is a moderate to large suprapatellar right knee joint effusion. There is apparent intra-articular gas within the suprapatellar right knee joint space. No fracture, malalignment, cortical erosions or suspicious focal osseous lesions. Moderate superior right patellar enthesophyte. 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 IMPRESSION: 1. Moderate to large suprapatellar right knee joint effusion. Apparent intra-articular gas in the suprapatellar right knee joint. Septic arthritis cannot be excluded. 2. No cortical erosions. 3. Minimal patellofemoral compartment osteoarthritis. These results will be called to the ordering clinician or representative by the Radiologist Assistant, and communication documented in the PACS or zVision Dashboard. Electronically Signed   By: Delbert Phenix M.D.   On: 07/19/2015 11:42      Time Spent in minutes  25   Jaxxson Cavanah K M.D on 07/21/2015 at 2:21 PM  Between 7am to 7pm - Pager - [phone]  After 7pm go to [email or web address] - password [email or web address]  [facility] -  Office  [phone]

## 2015-07-21 NOTE — Discharge Instructions (Signed)
Follow with Primary MD in 7 days  ° °Get CBC, CMP, 2 view Chest X Grimm checked  by Primary MD next visit.  ° ° °Activity: As tolerated with Full fall precautions use walker/cane & assistance as needed ° ° °Disposition Home   ° ° °Diet: Heart Healthy  ° °For Heart failure patients - Check your Weight same time everyday, if you gain over 2 pounds, or you develop in leg swelling, experience more shortness of breath or chest pain, call your Primary MD immediately. Follow Cardiac Low Salt Diet and 1.5 lit/day fluid restriction. ° ° °On your next visit with your primary care physician please Get Medicines reviewed and adjusted. ° ° °Please request your Prim.MD to go over all Hospital Tests and Procedure/Radiological results at the follow up, please get all Hospital records sent to your Prim MD by signing hospital release before you go home. ° ° °If you experience worsening of your admission symptoms, develop shortness of breath, life threatening emergency, suicidal or homicidal thoughts you must seek medical attention immediately by calling 911 or calling your MD immediately  if symptoms less severe. ° °You Must read complete instructions/literature along with all the possible adverse reactions/side effects for all the Medicines you take and that have been prescribed to you. Take any new Medicines after you have completely understood and accpet all the possible adverse reactions/side effects.  ° °Do not drive, operating heavy machinery, perform activities at heights, swimming or participation in water activities or provide baby sitting services if your were admitted for syncope or siezures until you have seen by Primary MD or a Neurologist and advised to do so again. ° °Do not drive when taking Pain medications.  ° ° °Do not take more than prescribed Pain, Sleep and Anxiety Medications ° °Special Instructions: If you have smoked or chewed Tobacco  in the last 2 yrs please stop smoking, stop any regular Alcohol  and or any  Recreational drug use. ° °Wear Seat belts while driving. ° ° °Please note ° °You were cared for by a hospitalist during your hospital stay. If you have any questions about your discharge medications or the care you received while you were in the hospital after you are discharged, you can call the unit and asked to speak with the hospitalist on call if the hospitalist that took care of you is not available. Once you are discharged, your primary care physician will handle any further medical issues. Please note that NO REFILLS for any discharge medications will be authorized once you are discharged, as it is imperative that you return to your primary care physician (or establish a relationship with a primary care physician if you do not have one) for your aftercare needs so that they can reassess your need for medications and monitor your lab values. ° °

## 2015-07-21 NOTE — Consult Note (Addendum)
    CARDIOLOGY CONSULT NOTE   Patient ID: [name] MRN: [id number] DOB/AGE: 56/03/1961 54 y.o.  Admit date: 07/18/2015  Primary Physician   No PCP Per Patient Primary Cardiologist   new, Dr. Krisy Dix Reason for Consultation   Abnormal echo, moderate AS  HPI:[name] is a 54 y.o. year old male with a history of HTN, AS, GERD, HLD, history of noncompliance with meds (he is doing better now).  [name] had cardiology evaluation in [place] about 15 years ago. He has not seen cardiology in [place]. 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 The job a strenuous. He will get short of breath when he is working, but the dyspnea on exertion has not changed recently. He never gets chest pain when he is exerting himself.  He occasionally gets chest pain during the day. He feels it is related to reflux as it gets better when he takes Prilosec or takes Tums. He describes it as an ache in the center of his chest.  He has occasional presyncope. He feels that this generally happens towards the middle of the day, before lunch. It doesn't happen every week and resolves quickly.  He never gets palpitations. He has never passed out.   He came to the hospital this time for painful ambulation and swelling that was in his left knee and then in his right knee. He has been seen by orthopedics because of his right knee effusion, which was drained. He is feeling some better.   Past Medical History  Diagnosis Date  . High cholesterol   . Aortic stenosis 2013    mild in 2013  . Angina   . Exertional dyspnea   . Gout     "sometimes flares up even w/Allupurinol"  . Hypertension   . GERD (gastroesophageal reflux disease)   . Sleep apnea 08/2010    "not required to wear mask"     Past Surgical History  Procedure Laterality Date  . Upper gastrointestinal endoscopy  03/2011  . Colonoscopy  03/2011  . Knee arthroscopy Right 2004    "w/ligament repair in  kneecap"  . Multiple tooth extractions  06/2010    full mouth  . Tonsillectomy           Allergies  Allergen Reactions  . Adhesive [Tape] Other (See Comments)    Makes the skin feel as if it is burning, will also bruise the skin.  . Latex Hives and Itching    Burns skin    I have reviewed the patient's current medications . amLODipine  10 mg Oral Daily  . aspirin  81 mg Oral Daily  . carvedilol  12.5 mg Oral BID WC  . colchicine  0.6 mg Oral BID  . enoxaparin (LOVENOX) injection  40 mg Subcutaneous Q24H  . hydrALAZINE  100 mg Oral 3 times per day  . loratadine  10 mg Oral Daily  . methylPREDNISolone (SOLU-MEDROL) injection  60 mg Intravenous Q24H  . sodium chloride  500 mL Intravenous Once     acetaminophen **OR** [DISCONTINUED] acetaminophen, hydrALAZINE, nitroGLYCERIN, senna-docusate  Prior to Admission medications   Medication Sig Start Date End Date Taking? Authorizing Provider  acetaminophen (TYLENOL) 325 MG tablet Take 2 tablets (650 mg total) by mouth every 6 (six) hours as needed for moderate pain. 07/21/15   Prashant K Singh, MD  amLODipine (NORVASC) 10 MG tablet Take 1 tablet (10 mg total) by mouth daily. 07/21/15     Prashant K Singh, MD  aspirin 81 MG chewable tablet Chew 1 tablet (81 mg total) by mouth daily. 07/21/15   Prashant K Singh, MD  carvedilol (COREG) 12.5 MG tablet Take 1 tablet (12.5 mg total) by mouth 2 (two) times daily with a meal. 07/21/15   Prashant K Singh, MD  colchicine 0.6 MG tablet Please take once daily as needed for gout pain 07/21/15   Prashant K Singh, MD  hydrALAZINE (APRESOLINE) 50 MG tablet Take 1 tablet (50 mg total) by mouth every 8 (eight) hours. 07/21/15   Prashant K Singh, MD  lisinopril (PRINIVIL,ZESTRIL) 20 MG tablet Take 1 tablet (20 mg total) by mouth once. 07/21/15   Prashant K Singh, MD  methylPREDNISolone (MEDROL DOSEPAK) 4 MG TBPK tablet follow package directions 07/21/15   Prashant K Singh, MD  pantoprazole (PROTONIX) 40 MG tablet  Take 1 tablet (40 mg total) by mouth daily. 11/27/11 11/26/12  Kevin Steinl, MD  pantoprazole (PROTONIX) 40 MG tablet Take 1 tablet (40 mg total) by mouth daily. 07/21/15   Prashant K Singh, MD     Social History   Social History  . Marital Status: Married    Spouse Name: N/A  . Number of Children: N/A  . Years of Education: N/A   Occupational History  . Not on file.   Social History Main Topics  . Smoking status: Former Smoker -- .1 years    Types: Cigarettes    Quit date: 04/04/1976  . Smokeless tobacco: Never Used  . Alcohol Use: No  . Drug Use: Yes    Special: Marijuana     Comment: 04/22/2015 "none for 20-30 years now"  . Sexual Activity:    Partners: Female   Other Topics Concern  . Not on file   Social History Narrative   ** Merged History Encounter **        No family status information on file.   Family History  Problem Relation Age of Onset  . Hypertension    . Asthma Daughter      ROS:  Full 14 point review of systems complete and found to be negative unless listed above.  Physical Exam: Blood pressure 145/72, pulse 91, temperature 99.2 F (37.3 C), temperature source Oral, resp. rate 18, height 5' 9" (1.753 m), weight 210 lb 9.6 oz (95.528 kg), SpO2 97 %.  General: Well developed, well nourished, male in no acute distress Head: Eyes PERRLA, No xanthomas.   Normocephalic and atraumatic, oropharynx without edema or exudate. Dentition: Poor Lungs: Clear bilaterally Heart: HRRR S1 S2, no rub/gallop, 2/6 murmur. pulses are 2+ all 4 extrem.   Neck: No carotid bruits. No lymphadenopathy.  JVD not elevated. Abdomen: Bowel sounds present, abdomen soft and non-tender without masses or hernias noted. Msk:  No spine or cva tenderness. No weakness, no joint deformities or effusions. Extremities: No clubbing or cyanosis. No lower extremity edema. Minimal knee effusion Neuro: Alert and oriented X 3. No focal deficits noted. Psych:  Good affect, responds  appropriately Skin: No rashes or lesions noted.  Labs:   Lab Results  Component Value Date   WBC 10.1 07/20/2015   HGB 13.2 07/20/2015   HCT 39.4 07/20/2015   MCV 70.4* 07/20/2015   PLT 147* 07/20/2015    Recent Labs Lab 07/18/15 2150  07/20/15 0312 07/21/15 0535  NA 134*  < > 140  --   K 3.0*  < > 5.6* 3.7  CL 101  < > 111  --   CO2   22  < > 19*  --   BUN 13  < > 14  --   CREATININE 1.09  < > 1.02  --   CALCIUM 9.1  < > 8.9  --   PROT 7.3  --   --   --   BILITOT 0.7  --   --   --   ALKPHOS 46  --   --   --   ALT 28  --   --   --   AST 25  --   --   --   GLUCOSE 106*  < > 117*  --   ALBUMIN 3.6  --   --   --   < > = values in this interval not displayed. MAGNESIUM  Date Value Ref Range Status  07/20/2015 2.5* 1.7 - 2.4 mg/dL Final    Recent Labs  07/20/15 0904 07/20/15 1534 07/21/15 1032  TROPONINI 0.03 0.10* 0.10*    Recent Labs  07/18/15 2218  TROPIPOC 0.06   TSH  Date/Time Value Ref Range Status  07/18/2015 09:50 PM 0.506 0.350 - 4.500 uIU/mL Final     Echo: 07/20/2015 - Left ventricle: The cavity size was normal. Wall thickness was  increased in a pattern of moderate LVH. Systolic function was  normal. The estimated ejection fraction was in the range of 55%  to 60%. - Aortic valve: There was moderate stenosis. There was trivial  regurgitation. Valve area (VTI): 1.79 cm^2. Valve area (Vmax):  1.5 cm^2. Valve area (Vmean): 1.55 cm^2.     Mean gradient (S): 23 mm Hg. Peak gradient (S): 45 mm Hg. - Mitral valve: Mobile calcified density near intervalvular fibrosa  on atrial side of anterior mitral leaflet This coupled with dense  calcification of aortic valve ( cannot r/o vegetation) would  consider f/u TEE to r/o vegetations SBE. There was mild  regurgitation. - Left atrium: The atrium was moderately to severely dilated. - Atrial septum: No defect or patent foramen ovale was identified. - Pulmonary arteries: PA peak pressure: 50 mm Hg  (S).  Echo 2015 - Left ventricle: The cavity size was normal. Wall thickness was increased in a pattern of severe LVH. Systolic function was vigorous. The estimated ejection fraction was in the range of 65% to 70%. Doppler parameters are consistent with abnormal left ventricular relaxation (grade 1 diastolic dysfunction). - Aortic valve: AV is difficult to see well It is thickened, calcified with restricted motion Peak and mean gradients through the valve are 26 and 15 mm Hg respectively consistent with mildAS. - Left atrium: The atrium was moderately dilated.  ECG:  07/20/2015 Sinus rhythm, no significant ST depression or elevation, diffuse T-wave flattening  Radiology:  Dg Chest Port 1 View 07/20/2015  CLINICAL DATA:  Shortness of breath. EXAM: PORTABLE CHEST 1 VIEW COMPARISON:  07/18/2015 and 04/20/2015 FINDINGS: There is suggestion of new cardiomegaly while this may be accentuated by the AP portable technique. There is slight distention of the azygos vein in the pulmonary vascularity is slightly more prominent than on the prior exam but within normal limits. No infiltrates or effusions.  Bones are normal. IMPRESSION: Slight pulmonary vascular congestion. Possible new slight cardiomegaly. Electronically Signed   By: James  Maxwell M.D.   On: 07/20/2015 07:17    ASSESSMENT AND PLAN:   The patient was seen today by Dr Daryon Remmert, the patient evaluated and the data reviewed.   1. Aortic stenosis/mitral valve abnormality: - The AS has worsened since 2015, mitral valve abnormality   is new.  - Mean/peak gradients have changed from 15/26 up to 23/45. - His chest pain is most likely from GI issues, but he is also having some dyspnea on exertion and occasionally presyncope.  - There does not seem to have been a sudden change, but he feels the symptoms are worse than they were a couple years ago. - M.D. advise if TEE, surgical referral or ischemic evaluation is needed at this time -  Otherwise ready for d/c, unless cardiology inpatient eval needed.  2. Hypertension, hypertensive urgency on admission - BP on admission was 224/146 - Per patient, his fiance is making sure he is compliant with his medications - Heart rate is generally elevated, will increase beta blocker from 12.5 mg twice a day up to 25 mg twice a day. - Discuss additional med changes with M.D.  Otherwise, per Internal Medicine Principal Problem:   Fever Active Problems:   Gout   Hypertensive urgency   Hypokalemia   Pyrexia   Right knee pain   Signed: Barrett, Rhonda, PA-C 07/21/2015 12:25 PM Beeper [phone]  Co-Sign MD The patient has been seen in conjunction with Rhonda Barrett, PA-C. All aspects of care have been considered and discussed. The patient has been personally interviewed, examined, and all clinical data has been reviewed.   The patient was admitted with articular complaints in both lower extremities.  An echocardiogram was performed as part of workup for fever in this patient with history of aortic stenosis. She presented with no cardiopulmonary complaints.  Exam reveals an obese AA male. BP is elevated. 3/6 systolic murmur is heard. No diastolic murmur is heard.  Echocardiography demonstrated moderate aortic stenosis and a mobile mass in the left atrium. Myxoma and/or vegetation needs to be excluded.  Elevated troponin is of uncertain significance and likely demand related in this patient with hypertension, LVH, and fever.   Overall, I am concerned by the appearance of the mobile mass in this setting (looks like vegetation) and feel that he needs further w/u with TEE prior to DC given h/o poor healthcare follow-up. My concern is for endocarditis, embolic stroke and potential clinical worsening prior to OP TEE. 

## 2015-07-21 NOTE — Care Management Note (Signed)
Case Management Note  Patient Details  Name: Pamalee Leydenugene Backhaus MRN: [id number] Date of Birth: 12-26-1960  Subjective/Objective:                    Action/Plan:  Consulted for MATCH letter. Patient just received MATCH letter ( medication assistance ) in Jan 2017 , eligible once per calender year .  Explained to patient over rode , and [facility] . Patient understands he will NOT receive another MATCH letter until one year from now.  [facility]ave [facility] on [facility] .  Patient states he will call for follow up.  Expected Discharge Date:                  Expected Discharge Plan:  Home/Self Care  In-House Referral:  Clinical Social Work  Discharge planning Services  CM Consult, Indigent Health Clinic, MATCH Program, Medication Assistance  Post Acute Care Choice:    Choice offered to:  Patient  DME Arranged:    DME Agency:     HH Arranged:    HH Agency:     Status of Service:  Completed, signed off  Medicare Important Message Given:    Date Medicare IM Given:    Medicare IM give by:    Date Additional Medicare IM Given:    Additional Medicare Important Message give by:     If discussed at Long Length of Stay Meetings, dates discussed:    Additional Comments:  Kingsley PlanWile, Braeson Rupe Marie, RN 07/21/2015, 12:18 PM

## 2015-07-22 ENCOUNTER — Encounter (HOSPITAL_COMMUNITY): Payer: Self-pay | Admitting: *Deleted

## 2015-07-22 ENCOUNTER — Encounter (HOSPITAL_COMMUNITY)
Admission: EM | Disposition: A | Discharge status: Home / Self Care | Payer: Self-pay | Source: Home / Self Care | Attending: Internal Medicine

## 2015-07-22 ENCOUNTER — Inpatient Hospital Stay (HOSPITAL_COMMUNITY): Payer: MEDICAID

## 2015-07-22 DIAGNOSIS — I350 Nonrheumatic aortic (valve) stenosis: Secondary | ICD-10-CM

## 2015-07-22 HISTORY — PX: TEE WITHOUT CARDIOVERSION: SHX5443

## 2015-07-22 LAB — BASIC METABOLIC PANEL
Anion gap: 12 (ref 5–15)
BUN: 19 mg/dL (ref 6–20)
CO2: 21 mmol/L — ABNORMAL LOW (ref 22–32)
CREATININE: 0.95 mg/dL (ref 0.61–1.24)
Calcium: 9.3 mg/dL (ref 8.9–10.3)
Chloride: 111 mmol/L (ref 101–111)
Glucose, Bld: 106 mg/dL — ABNORMAL HIGH (ref 65–99)
Potassium: 3.9 mmol/L (ref 3.5–5.1)
SODIUM: 144 mmol/L (ref 135–145)

## 2015-07-22 LAB — CBC
HCT: 39.4 % (ref 39.0–52.0)
HEMOGLOBIN: 13.3 g/dL (ref 13.0–17.0)
MCH: 23.8 pg — AB (ref 26.0–34.0)
MCHC: 33.8 g/dL (ref 30.0–36.0)
MCV: 70.6 fL — ABNORMAL LOW (ref 78.0–100.0)
PLATELETS: 194 10*3/uL (ref 150–400)
RBC: 5.58 MIL/uL (ref 4.22–5.81)
RDW: 13.6 % (ref 11.5–15.5)
WBC: 10.1 10*3/uL (ref 4.0–10.5)

## 2015-07-22 SURGERY — ECHOCARDIOGRAM, TRANSESOPHAGEAL
Anesthesia: Moderate Sedation

## 2015-07-22 MED ORDER — HYDROCHLOROTHIAZIDE 25 MG PO TABS: 25.0000 mg | ORAL_TABLET | Freq: Every day | ORAL | Status: DC

## 2015-07-22 MED ORDER — MIDAZOLAM HCL 5 MG/ML IJ SOLN
INTRAMUSCULAR | Status: AC
  Filled 2015-07-22: qty 2

## 2015-07-22 MED ORDER — MIDAZOLAM HCL 10 MG/2ML IJ SOLN
INTRAMUSCULAR | Status: DC | PRN
  Administered 2015-07-22: 1 mg via INTRAVENOUS
  Administered 2015-07-22: 3 mg via INTRAVENOUS

## 2015-07-22 MED ORDER — HYDRALAZINE HCL 20 MG/ML IJ SOLN
INTRAMUSCULAR | Status: AC
  Filled 2015-07-22: qty 1

## 2015-07-22 MED ORDER — FENTANYL CITRATE (PF) 100 MCG/2ML IJ SOLN
INTRAMUSCULAR | Status: AC
  Filled 2015-07-22: qty 2

## 2015-07-22 MED ORDER — BUTAMBEN-TETRACAINE-BENZOCAINE 2-2-14 % EX AERO
INHALATION_SPRAY | CUTANEOUS | Status: DC | PRN
  Administered 2015-07-22: 2 via TOPICAL

## 2015-07-22 MED ORDER — HYDRALAZINE HCL 20 MG/ML IJ SOLN
INTRAMUSCULAR | Status: DC | PRN
  Administered 2015-07-22 (×2): 10 mg via INTRAVENOUS

## 2015-07-22 MED ORDER — FENTANYL CITRATE (PF) 100 MCG/2ML IJ SOLN
INTRAMUSCULAR | Status: DC | PRN
  Administered 2015-07-22: 50 ug via INTRAVENOUS
  Administered 2015-07-22: 25 ug via INTRAVENOUS

## 2015-07-22 NOTE — Interval H&P Note (Signed)
History and Physical Interval Note:  07/22/2015 10:50 AM  [name]  has presented today for surgery, with the diagnosis of look at valves  The various methods of treatment have been discussed with the patient and family. After consideration of risks, benefits and other options for treatment, the patient has consented to  Procedure(s): TRANSESOPHAGEAL ECHOCARDIOGRAM (TEE) (N/A) as a surgical intervention .  The patient's history has been reviewed, patient examined, no change in status, stable for surgery.  I have reviewed the patient's chart and labs.  Questions were answered to the patient's satisfaction.     Charlton HawsPeter Nishan

## 2015-07-22 NOTE — CV Procedure (Signed)
SEE full note in [employer] Mild to moderate AS Mild MR Severe LVH EF 65% No SBE/Vegetation.   During this procedure the patient is administered a total of Versed 5 mg and Fentanyl 75 mg to achieve and maintain moderate conscious sedation.  The patient's heart rate, blood pressure, and oxygen saturation are monitored continuously during the procedure. The period of conscious sedation is 30 minutes, of which I was present face-to-face 100% of this time.  Severe HTN received 20 mg iv hydralazine  Shawn HawsPeter Lundyn Small

## 2015-07-22 NOTE — Progress Notes (Signed)
Pt discharged home and provided with taxi voucher by Child psychotherapistsocial worker.discharge education provided with no concerns voiced. Pt condition stable

## 2015-07-22 NOTE — H&P (View-Only) (Signed)
CARDIOLOGY CONSULT NOTE   Patient ID: [name] MRN: [id number] DOB/AGE: 09/27/1960 55 y.o.  Admit date: 07/18/2015  Primary Physician   No PCP Per Patient Primary Cardiologist   new, Dr. Katrinka Blazing Reason for Consultation   Abnormal echo, moderate AS  [name] is a 55 y.o. year old male with a history of HTN, AS, GERD, HLD, history of noncompliance with meds (he is doing better now).  [name] had cardiology evaluation in [place] about 15 years ago. He has not seen cardiology in [place]. 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 The job a strenuous. He will get short of breath when he is working, but the dyspnea on exertion has not changed recently. He never gets chest pain when he is exerting himself.  He occasionally gets chest pain during the day. He feels it is related to reflux as it gets better when he takes Prilosec or takes Tums. He describes it as an ache in the center of his chest.  He has occasional presyncope. He feels that this generally happens towards the middle of the day, before lunch. It doesn't happen every week and resolves quickly.  He never gets palpitations. He has never passed out.   He came to the hospital this time for painful ambulation and swelling that was in his left knee and then in his right knee. He has been seen by orthopedics because of his right knee effusion, which was drained. He is feeling some better.   Past Medical History  Diagnosis Date  . High cholesterol   . Aortic stenosis 2013    mild in 2013  . Angina   . Exertional dyspnea   . Gout     "sometimes flares up even w/Allupurinol"  . Hypertension   . GERD (gastroesophageal reflux disease)   . Sleep apnea 08/2010    "not required to wear mask"     Past Surgical History  Procedure Laterality Date  . Upper gastrointestinal endoscopy  03/2011  . Colonoscopy  03/2011  . Knee arthroscopy Right 2004    "w/ligament repair in  kneecap"  . Multiple tooth extractions  06/2010    full mouth  . Tonsillectomy           Allergies  Allergen Reactions  . Adhesive [Tape] Other (See Comments)    Makes the skin feel as if it is burning, will also bruise the skin.  . Latex Hives and Itching    Burns skin    I have reviewed the patient's current medications . amLODipine  10 mg Oral Daily  . aspirin  81 mg Oral Daily  . carvedilol  12.5 mg Oral BID WC  . colchicine  0.6 mg Oral BID  . enoxaparin (LOVENOX) injection  40 mg Subcutaneous Q24H  . hydrALAZINE  100 mg Oral 3 times per day  . loratadine  10 mg Oral Daily  . methylPREDNISolone (SOLU-MEDROL) injection  60 mg Intravenous Q24H  . sodium chloride  500 mL Intravenous Once     acetaminophen **OR** [DISCONTINUED] acetaminophen, hydrALAZINE, nitroGLYCERIN, senna-docusate  Prior to Admission medications   Medication Sig Start Date End Date Taking? Authorizing Provider  acetaminophen (TYLENOL) 325 MG tablet Take 2 tablets (650 mg total) by mouth every 6 (six) hours as needed for moderate pain. 07/21/15   Leroy Sea, MD  amLODipine (NORVASC) 10 MG tablet Take 1 tablet (10 mg total) by mouth daily. 07/21/15  Leroy Sea, MD  aspirin 81 MG chewable tablet Chew 1 tablet (81 mg total) by mouth daily. 07/21/15   Leroy Sea, MD  carvedilol (COREG) 12.5 MG tablet Take 1 tablet (12.5 mg total) by mouth 2 (two) times daily with a meal. 07/21/15   Leroy Sea, MD  colchicine 0.6 MG tablet Please take once daily as needed for gout pain 07/21/15   Leroy Sea, MD  hydrALAZINE (APRESOLINE) 50 MG tablet Take 1 tablet (50 mg total) by mouth every 8 (eight) hours. 07/21/15   Leroy Sea, MD  lisinopril (PRINIVIL,ZESTRIL) 20 MG tablet Take 1 tablet (20 mg total) by mouth once. 07/21/15   Leroy Sea, MD  methylPREDNISolone (MEDROL DOSEPAK) 4 MG TBPK tablet follow package directions 07/21/15   Leroy Sea, MD  pantoprazole (PROTONIX) 40 MG tablet  Take 1 tablet (40 mg total) by mouth daily. 11/27/11 11/26/12  Cathren Laine, MD  pantoprazole (PROTONIX) 40 MG tablet Take 1 tablet (40 mg total) by mouth daily. 07/21/15   Leroy Sea, MD     Social History   Social History  . Marital Status: Married    Spouse Name: N/A  . Number of Children: N/A  . Years of Education: N/A   Occupational History  . Not on file.   Social History Main Topics  . Smoking status: Former Smoker -- .1 years    Types: Cigarettes    Quit date: 04/04/1976  . Smokeless tobacco: Never Used  . Alcohol Use: No  . Drug Use: Yes    Special: Marijuana     Comment: 04/22/2015 "none for 20-30 years now"  . Sexual Activity:    Partners: Female   Other Topics Concern  . Not on file   Social History Narrative   ** Merged History Encounter **        No family status information on file.   Family History  Problem Relation Age of Onset  . Hypertension    . Asthma Daughter      ROS:  Full 14 point review of systems complete and found to be negative unless listed above.  Physical Exam: Blood pressure 145/72, pulse 91, temperature 99.2 F (37.3 C), temperature source Oral, resp. rate 18, height  (1.753 m), weight 210 lb 9.6 oz (95.528 kg), SpO2 97 %.  General: Well developed, well nourished, male in no acute distress Head: Eyes PERRLA, No xanthomas.   Normocephalic and atraumatic, oropharynx without edema or exudate. Dentition: Poor Lungs: Clear bilaterally Heart: HRRR S1 S2, no rub/gallop, 2/6 murmur. pulses are 2+ all 4 extrem.   Neck: No carotid bruits. No lymphadenopathy.  JVD not elevated. Abdomen: Bowel sounds present, abdomen soft and non-tender without masses or hernias noted. Msk:  No spine or cva tenderness. No weakness, no joint deformities or effusions. Extremities: No clubbing or cyanosis. No lower extremity edema. Minimal knee effusion Neuro: Alert and oriented X 3. No focal deficits noted. Psych:  Good affect, responds  appropriately Skin: No rashes or lesions noted.  Labs:   Lab Results  Component Value Date   WBC 10.1 07/20/2015   HGB 13.2 07/20/2015   HCT 39.4 07/20/2015   MCV 70.4* 07/20/2015   PLT 147* 07/20/2015    Recent Labs Lab 07/18/15 2150  07/20/15 0312 07/21/15 0535  NA 134*  < > 140  --   K 3.0*  < > 5.6* 3.7  CL 101  < > 111  --   CO2  22  < > 19*  --   BUN 13  < > 14  --   CREATININE 1.09  < > 1.02  --   CALCIUM 9.1  < > 8.9  --   PROT 7.3  --   --   --   BILITOT 0.7  --   --   --   ALKPHOS 46  --   --   --   ALT 28  --   --   --   AST 25  --   --   --   GLUCOSE 106*  < > 117*  --   ALBUMIN 3.6  --   --   --   < > = values in this interval not displayed. MAGNESIUM  Date Value Ref Range Status  07/20/2015 2.5* 1.7 - 2.4 mg/dL Final    Recent Labs  16/10/96 0904 07/20/15 1534 07/21/15 1032  TROPONINI 0.03 0.10* 0.10*    Recent Labs  07/18/15 2218  TROPIPOC 0.06   TSH  Date/Time Value Ref Range Status  07/18/2015 09:50 PM 0.506 0.350 - 4.500 uIU/mL Final     Echo: 07/20/2015 - Left ventricle: The cavity size was normal. Wall thickness was  increased in a pattern of moderate LVH. Systolic function was  normal. The estimated ejection fraction was in the range of 55%  to 60%. - Aortic valve: There was moderate stenosis. There was trivial  regurgitation. Valve area (VTI): 1.79 cm^2. Valve area (Vmax):  1.5 cm^2. Valve area (Vmean): 1.55 cm^2.     Mean gradient (S): 23 mm Hg. Peak gradient (S): 45 mm Hg. - Mitral valve: Mobile calcified density near intervalvular fibrosa  on atrial side of anterior mitral leaflet This coupled with dense  calcification of aortic valve ( cannot r/o vegetation) would  consider f/u TEE to r/o vegetations SBE. There was mild  regurgitation. - Left atrium: The atrium was moderately to severely dilated. - Atrial septum: No defect or patent foramen ovale was identified. - Pulmonary arteries: PA peak pressure: 50 mm Hg  (S).  Echo 2015 - Left ventricle: The cavity size was normal. Wall thickness was increased in a pattern of severe LVH. Systolic function was vigorous. The estimated ejection fraction was in the range of 65% to 70%. Doppler parameters are consistent with abnormal left ventricular relaxation (grade 1 diastolic dysfunction). - Aortic valve: AV is difficult to see well It is thickened, calcified with restricted motion Peak and mean gradients through the valve are 26 and 15 mm Hg respectively consistent with mildAS. - Left atrium: The atrium was moderately dilated.  ECG:  07/20/2015 Sinus rhythm, no significant ST depression or elevation, diffuse T-wave flattening  Radiology:  Dg Chest Port 1 View 07/20/2015  CLINICAL DATA:  Shortness of breath. EXAM: PORTABLE CHEST 1 VIEW COMPARISON:  07/18/2015 and 04/20/2015 FINDINGS: There is suggestion of new cardiomegaly while this may be accentuated by the AP portable technique. There is slight distention of the azygos vein in the pulmonary vascularity is slightly more prominent than on the prior exam but within normal limits. No infiltrates or effusions.  Bones are normal. IMPRESSION: Slight pulmonary vascular congestion. Possible new slight cardiomegaly. Electronically Signed   By: Francene Boyers M.D.   On: 07/20/2015 07:17    ASSESSMENT AND PLAN:   The patient was seen today by Dr Katrinka Blazing, the patient evaluated and the data reviewed.   1. Aortic stenosis/mitral valve abnormality: - The AS has worsened since 2015, mitral valve abnormality  is new.  - Mean/peak gradients have changed from 15/26 up to 23/45. - His chest pain is most likely from GI issues, but he is also having some dyspnea on exertion and occasionally presyncope.  - There does not seem to have been a sudden change, but he feels the symptoms are worse than they were a couple years ago. - M.D. advise if TEE, surgical referral or ischemic evaluation is needed at this time -  Otherwise ready for d/c, unless cardiology inpatient eval needed.  2. Hypertension, hypertensive urgency on admission - BP on admission was 224/146 - Per patient, his fiance is making sure he is compliant with his medications - Heart rate is generally elevated, will increase beta blocker from 12.5 mg twice a day up to 25 mg twice a day. - Discuss additional med changes with M.D.  Otherwise, per Internal Medicine Principal Problem:   Fever Active Problems:   Gout   Hypertensive urgency   Hypokalemia   Pyrexia   Right knee pain   Signed: Leanna BattlesBarrett, Rhonda, PA-C 07/21/2015 12:25 PM Beeper [phone]  Co-Sign MD The patient has been seen in conjunction with Theodore Demarkhonda Barrett, PA-C. All aspects of care have been considered and discussed. The patient has been personally interviewed, examined, and all clinical data has been reviewed.   The patient was admitted with articular complaints in both lower extremities.  An echocardiogram was performed as part of workup for fever in this patient with history of aortic stenosis. She presented with no cardiopulmonary complaints.  Exam reveals an obese AA male. BP is elevated. 3/6 systolic murmur is heard. No diastolic murmur is heard.  Echocardiography demonstrated moderate aortic stenosis and a mobile mass in the left atrium. Myxoma and/or vegetation needs to be excluded.  Elevated troponin is of uncertain significance and likely demand related in this patient with hypertension, LVH, and fever.   Overall, I am concerned by the appearance of the mobile mass in this setting (looks like vegetation) and feel that he needs further w/u with TEE prior to DC given h/o poor healthcare follow-up. My concern is for endocarditis, embolic stroke and potential clinical worsening prior to OP TEE.

## 2015-07-22 NOTE — Plan of Care (Signed)
     [name] was admitted to the Hospital on 07/18/2015 and Discharged  07/22/2015 and should be excused from work/school   for 5 days starting 07/18/2015 , may return to work/school without any restrictions.  Call Shawn RaringPrashant Singh MD, [facility]  [phone] with questions.  Leroy SeaSINGH,PRASHANT K M.D on 07/22/2015,at 11:15 AM  [facility]   Office  [phone]

## 2015-07-22 NOTE — Progress Notes (Signed)
Echocardiogram Echocardiogram Transesophageal has been performed.  [name], [name] M 07/22/2015, 11:47 AM

## 2015-07-22 NOTE — Progress Notes (Addendum)
   The patient will have transesophageal echo performed today to exclude vegetation on the mitral valve apparatus.  If vegetation is confirmed,may need to consider marantic endocarditis and/or culture negative infective endocarditis. Will likely need an ID consult to sort out.  If no vegetation identified,discharge would be appropriate.  He needs better blood pressure control. Diuretic therapy should be instituted. It is understood that this may aggravate gout.

## 2015-07-22 NOTE — Discharge Summary (Signed)
[name], is a 55 y.o. male  DOB Feb 24, 1961  MRN [id number].  Admission date:  07/18/2015  Admitting Physician  Alberteen Samhristopher P Danford, MD  Discharge Date:  07/22/2015   Primary MD  No PCP Per Patient  Recommendations for primary care physician for things to follow:   Check CBC, BMP and blood pressure within a week  Needs one-time outpatient cardiology follow-up, monitor uric acid levels closely.   Admission Diagnosis  Sepsis, due to unspecified organism (HCC) [A41.9] Fever, unspecified fever cause [R50.9]   Discharge Diagnosis  Sepsis, due to unspecified organism (HCC) [A41.9] Fever, unspecified fever cause [R50.9]    Principal Problem:   Fever Active Problems:   Gout   Hypertensive urgency   Hypokalemia   Pyrexia   Right knee pain      Past Medical History  Diagnosis Date  . High cholesterol   . Aortic stenosis 2013    mild in 2013  . Angina   . Exertional dyspnea   . Gout     "sometimes flares up even w/Allupurinol"  . Hypertension   . GERD (gastroesophageal reflux disease)   . Sleep apnea 08/2010    "not required to wear mask"    Past Surgical History  Procedure Laterality Date  . Upper gastrointestinal endoscopy  03/2011  . Colonoscopy  03/2011  . Knee arthroscopy Right 2004    "w/ligament repair in kneecap"  . Multiple tooth extractions  06/2010    full mouth  . Tonsillectomy              HPI  from the history and physical done on the day of admission:    [name] is a 55 y.o. male with a past medical history significant for HTN poorly controlled and gout who presents with fever, malaise and 1 week migratory knee pain.  The patient was in his usual state of health until Tuesday when he had left knee swelling and pain, but kept him home from work. By Wednesday this was  improved and he was able to work, but on Thursday he woke up again with knee pain and swelling this time on the right side.  Since Thursday, right knee pain and swelling have persisted, and pain is now worse and he is not able to walk. It is also accompanied by fever, flushing, weakness, headache, mild cough. He has had no sick contacts. No sputum, dyspnea, rigors, dysuria.  In the ED, he was febrile to 100 2.4F, tachycardic, tachypneic, hypertensive and saturating well on ambient air. Na 134, K 3.0, Cr 1.1, WBC 8.5K, Hgb 13, lactate normal. UA clear. The right knee was tapped, had only 4000 cells primarily PMN, as well as MSU crystals. Flu swab and blood cultures were obtained, vanc and Zosyn were administered, and TRH were asked to evaluate for admission.      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 Renaye Rakers orthopedics and received her right knee intra-articular steroid injection on 07/19/2015, he is much improved and close to his baseline. All systemic symptoms and joint pains have resolved with steroids and colchicine, antibiotics were stopped on 07/20/2015 morning patient remains afebrile. Fevers likely due to systemic gout. Uric acid levels were stable due to consumption and falsely low, right knee x-Cottam on acute. He be placed on colchicine and a Medrol Dosepak. Follow with PCP. At some point if uric acid levels are elevated allopurinol can be initiated but will defer to PCP.  2. Hypertensive urgency. Placed on Norvasc, Coreg, ACE inhibitor, HCTZ and hydralazine. BP stable continue to monitor in the outpatient setting.  3. Hyperkalemia. Resolved after Kayexalate.  4. Noncompliance with allopurinol. Counseled to be compliant. I'm unsure if she's  taking his blood pressure medications regularly as well.  5. Chest pain morning of 07/20/2015. Likely due to poorly controlled blood pressure. EKG nonacute with nonspecific changes, Pollen and non-ACS pattern, blood pressure medications adjusted, completely pain-free, blood pressure stable with new regimen, chest pain-free.  6. Chronic Diastolic dysfunction due to uncontrolled hypertension EF 60%. Stable.  7. Mild-to-moderate aortic stenosis with a ? mobile mass noted on echogram, cardiology called may require TEE. Blood cultures negative, no risks for Endocarditis, has never done IV drug use or any history of sepsis or bacteremia, seen by cardiology underwent EEG which was unremarkable proving that the suspected mass was likely an artifact, I discussed this with TEE performing physician Dr. Eden Emms. Kindly follow final report which is pending. Recommend one-time outpatient cardiology follow-up for mild to moderate AS.    Follow UP  Follow-up Information    Follow up with [facility]. Schedule an appointment as soon as possible for a visit in 1 week.   Contact information:   [address] [place] [address] [phone]      Follow up with Lesleigh Noe, MD In 1 week.   Specialty:  Cardiology   Contact information:   [address] [place] [address] [phone]        Consults obtained - Cardiology  Discharge Condition: Stable  Diet and Activity recommendation: See Discharge Instructions below  Discharge Instructions         Discharge Instructions    Diet - low sodium heart healthy    Complete by:  As directed      Discharge instructions    Complete by:  As directed   Follow with Primary MD in 7 days   Get CBC, CMP, 2 view Chest X [place] checked  by Primary MD next visit.    Activity: As tolerated with Full fall precautions use walker/cane & assistance as needed   Disposition Home      Diet:   Heart Healthy .  For Heart failure patients - Check your Weight same time everyday, if you gain over 2 pounds, or you develop in leg swelling, experience more shortness of breath or chest pain, call your Primary MD immediately. Follow Cardiac Low Salt Diet and 1.5 lit/day fluid restriction.   On your next visit with your primary care physician please Get Medicines reviewed and adjusted.   Please request your Prim.MD to go over all Hospital Tests and Procedure/Radiological results at the follow up, please get all Hospital records sent to your Prim MD by signing hospital release  before you go home.   If you experience worsening of your admission symptoms, develop shortness of breath, life threatening emergency, suicidal or homicidal thoughts you must seek medical attention immediately by calling 911 or calling your MD immediately  if symptoms less severe.  You Must read complete instructions/literature along with all the possible adverse reactions/side effects for all the Medicines you take and that have been prescribed to you. Take any new Medicines after you have completely understood and accpet all the possible adverse reactions/side effects.   Do not drive, operating heavy machinery, perform activities at heights, swimming or participation in water activities or provide baby sitting services if your were admitted for syncope or siezures until you have seen by Primary MD or a Neurologist and advised to do so again.  Do not drive when taking Pain medications.    Do not take more than prescribed Pain, Sleep and Anxiety Medications  Special Instructions: If you have smoked or chewed Tobacco  in the last 2 yrs please stop smoking, stop any regular Alcohol  and or any Recreational drug use.  Wear Seat belts while driving.   Please note  You were cared for by a hospitalist during your hospital stay. If you have any questions about your discharge medications or the care you  received while you were in the hospital after you are discharged, you can call the unit and asked to speak with the hospitalist on call if the hospitalist that took care of you is not available. Once you are discharged, your primary care physician will handle any further medical issues. Please note that NO REFILLS for any discharge medications will be authorized once you are discharged, as it is imperative that you return to your primary care physician (or establish a relationship with a primary care physician if you do not have one) for your aftercare needs so that they can reassess your need for medications and monitor your lab values.     Increase activity slowly    Complete by:  As directed              Discharge Medications       Medication List    TAKE these medications        acetaminophen 325 MG tablet  Commonly known as:  TYLENOL  Take 2 tablets (650 mg total) by mouth every 6 (six) hours as needed for moderate pain.     amLODipine 10 MG tablet  Commonly known as:  NORVASC  Take 1 tablet (10 mg total) by mouth daily.     aspirin 81 MG chewable tablet  Chew 1 tablet (81 mg total) by mouth daily.     carvedilol 12.5 MG tablet  Commonly known as:  COREG  Take 1 tablet (12.5 mg total) by mouth 2 (two) times daily with a meal.     colchicine 0.6 MG tablet  Please take once daily as needed for gout pain     hydrALAZINE 50 MG tablet  Commonly known as:  APRESOLINE  Take 1 tablet (50 mg total) by mouth every 8 (eight) hours.     hydrochlorothiazide 25 MG tablet  Commonly known as:  HYDRODIURIL  Take 1 tablet (25 mg total) by mouth daily.     lisinopril 20 MG tablet  Commonly known as:  PRINIVIL,ZESTRIL  Take 1 tablet (20 mg total) by mouth once.     methylPREDNISolone 4 MG Tbpk tablet  Commonly known as:  MEDROL DOSEPAK  follow package directions  pantoprazole 40 MG tablet  Commonly known as:  PROTONIX  Take 1 tablet (40 mg total) by mouth daily.         Major procedures and Radiology Reports - PLEASE review detailed and final reports for all details, in brief -   TEE  - DW Dr Eden Emms, No mass or vegetation. No acute findings. GYN follow final report which is pending.  CT abdomen and pelvis unremarkable.  Right joint aspiration showing fluids with 5000 WBCs and positive intracellular sodium monourate crystals  TTE  - Left ventricle: The cavity size was normal. Wall thickness was increased in a pattern of moderate LVH. Systolic function was normal. The estimated ejection fraction was in the range of 55% to 60%. - Aortic valve: There was moderate stenosis. There was trivial regurgitation. Valve area (VTI): 1.79 cm^2. Valve area (Vmax): 1.5 cm^2. Valve area (Vmean): 1.55 cm^2. 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 There was mild regurgitation. - Left atrium: The atrium was moderately to severely dilated. - Atrial septum: No defect or patent foramen ovale was identified. - Pulmonary arteries: PA peak pressure: 50 mm Hg (S).     Ct Abdomen Pelvis W Contrast  07/19/2015  CLINICAL DATA:  Fever and abdominal tightness. EXAM: CT ABDOMEN AND PELVIS WITH CONTRAST TECHNIQUE: Multidetector CT imaging of the abdomen and pelvis was performed using the standard protocol following bolus administration of intravenous contrast. CONTRAST:  80mL ISOVUE-300 IOPAMIDOL (ISOVUE-300) INJECTION 61% COMPARISON:  None. FINDINGS: Lower chest:  No significant abnormality Hepatobiliary: There are normal appearances of the liver, gallbladder and bile ducts. Pancreas: Normal Spleen: Normal Adrenals/Urinary Tract: The adrenals and kidneys are normal in appearance. There is no urinary calculus evident. There is no hydronephrosis or ureteral dilatation. Collecting systems and  ureters appear unremarkable. Stomach/Bowel: There are normal appearances of the stomach, small bowel and colon. The appendix is normal. Vascular/Lymphatic: The abdominal aorta is normal in caliber with mild atherosclerotic calcification. There are multiple nonspecific para-aortic nodes. These only measure up to a short axis of about 12 mm, but they are generous in number. Reproductive: Unremarkable Other: No acute inflammatory changes are evident in the abdomen or pelvis. There is no ascites. Musculoskeletal: No significant abnormality IMPRESSION: No acute findings are evident in the abdomen or pelvis. Nonspecific mildly prominent periaortic nodes are present. Electronically Signed   By: Ellery Plunk M.D.   On: 07/19/2015 00:20   Dg Chest Port 1 View  07/20/2015  CLINICAL DATA:  Shortness of breath. EXAM: PORTABLE CHEST 1 VIEW COMPARISON:  07/18/2015 and 04/20/2015 FINDINGS: There is suggestion of new cardiomegaly while this may be accentuated by the AP portable technique. There is slight distention of the azygos vein in the pulmonary vascularity is slightly more prominent than on the prior exam but within normal limits. No infiltrates or effusions.  Bones are normal. IMPRESSION: Slight pulmonary vascular congestion. Possible new slight cardiomegaly. Electronically Signed   By: Francene Boyers M.D.   On: 07/20/2015 07:17   Dg Chest Port 1 View  07/18/2015  CLINICAL DATA:  Acute onset of fever and generalized abdominal pain. Initial encounter. EXAM: PORTABLE CHEST 1 VIEW COMPARISON:  Chest radiograph performed 04/20/2015 FINDINGS: The lungs are well-aerated and clear. There is no evidence of focal opacification, pleural effusion or pneumothorax. The cardiomediastinal silhouette is within normal limits. No acute  osseous abnormalities are seen. IMPRESSION: No acute cardiopulmonary process seen. Electronically Signed   By: Roanna Raider M.D.   On: 07/18/2015 23:08   Dg Knee Complete 4 Views  Right  07/19/2015  CLINICAL DATA:  Pain and swelling in the right knee. No reported injury. History of gout. EXAM: RIGHT KNEE - COMPLETE 4+ VIEW COMPARISON:  None. FINDINGS: There is a moderate to large suprapatellar right knee joint effusion. There is apparent intra-articular gas within the suprapatellar right knee joint space. No fracture, malalignment, cortical erosions or suspicious focal osseous lesions. Moderate superior right patellar enthesophyte. 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 IMPRESSION: 1. Moderate to large suprapatellar right knee joint effusion. Apparent intra-articular gas in the suprapatellar right knee joint. Septic arthritis cannot be excluded. 2. No cortical erosions. 3. Minimal patellofemoral compartment osteoarthritis. These results will be called to the ordering clinician or representative by the Radiologist Assistant, and communication documented in the PACS or zVision Dashboard. Electronically Signed   By: Delbert Phenix M.D.   On: 07/19/2015 11:42    Micro Results      Recent Results (from the past 240 hour(s))  Blood Culture (routine x 2)     Status: None (Preliminary result)   Collection Time: 07/18/15  9:50 PM  Result Value Ref Range Status   Specimen Description BLOOD RIGHT ARM  Final   Special Requests BOTTLES DRAWN AEROBIC AND ANAEROBIC 5CC  Final   Culture NO GROWTH 3 DAYS  Final   Report Status PENDING  Incomplete  Gram stain     Status: None   Collection Time: 07/18/15 10:19 PM  Result Value Ref Range Status   Specimen Description FLUID RIGHT KNEE  Final   Special Requests Normal  Final   Gram Stain   Final    ABUNDANT WBC PRESENT,BOTH PMN AND MONONUCLEAR NO ORGANISMS SEEN    Report Status 07/19/2015 FINAL  Final  Culture, body fluid-bottle     Status: None (Preliminary result)   Collection Time: 07/18/15 10:19 PM  Result Value Ref Range Status   Specimen Description FLUID SYNOVIAL RIGHT KNEE  Final   Special Requests BOTTLES  DRAWN AEROBIC ONLY 10CC  Final   Culture NO GROWTH 2 DAYS  Final   Report Status PENDING  Incomplete  Urine culture     Status: None   Collection Time: 07/18/15 10:28 PM  Result Value Ref Range Status   Specimen Description URINE, CLEAN CATCH  Final   Special Requests Normal  Final   Culture NO GROWTH 2 DAYS  Final   Report Status 07/20/2015 FINAL  Final  Blood Culture (routine x 2)     Status: None (Preliminary result)   Collection Time: 07/18/15 10:51 PM  Result Value Ref Range Status   Specimen Description BLOOD RIGHT HAND  Final   Special Requests BOTTLES DRAWN AEROBIC AND ANAEROBIC 5CC  Final   Culture NO GROWTH 3 DAYS  Final   Report Status PENDING  Incomplete       Today   Subjective    [name] today has no headache,no chest abdominal pain,no new weakness tingling or numbness, feels much better wants to go home today.    Objective   Blood pressure 138/78, pulse 80, temperature 98.1 F (36.7 C), temperature source Oral, resp. rate 24, height 5\' 9"  (1.753 m), weight 95.528 kg (210 lb 9.6 oz), SpO2 94 %.   Intake/Output Summary (Last 24 hours) at 07/22/15 1158 Last data filed at 07/22/15 0900  Gross per 24  hour  Intake    480 ml  Output      0 ml  Net    480 ml    Exam Awake Alert, Oriented x 3, No new F.N deficits, Normal affect [place].AT,PERRAL Supple Neck,No JVD, No cervical lymphadenopathy appriciated.  Symmetrical Chest wall movement, Good air movement bilaterally, CTAB RRR,No Gallops,Rubs or new Murmurs, No Parasternal Heave +ve B.Sounds, Abd Soft, Non tender, No organomegaly appriciated, No rebound -guarding or rigidity. No Cyanosis, Clubbing or edema, No new Rash or bruise   Data Review   CBC w Diff:  Lab Results  Component Value Date   WBC 10.1 07/22/2015   HGB 13.3 07/22/2015   HCT 39.4 07/22/2015   PLT 194 07/22/2015   LYMPHOPCT 26 07/18/2015   MONOPCT 11 07/18/2015   EOSPCT 0 07/18/2015   BASOPCT 0 07/18/2015    CMP:  Lab Results   Component Value Date   NA 144 07/22/2015   K 3.9 07/22/2015   CL 111 07/22/2015   CO2 21* 07/22/2015   BUN 19 07/22/2015   CREATININE 0.95 07/22/2015   PROT 7.3 07/18/2015   ALBUMIN 3.6 07/18/2015   BILITOT 0.7 07/18/2015   ALKPHOS 46 07/18/2015   AST 25 07/18/2015   ALT 28 07/18/2015  .   Total Time in preparing paper work, data evaluation and todays exam - 35 minutes  Leroy Sea M.D on 07/22/2015 at 11:58 AM  [facility]   Office  [phone]

## 2015-07-23 ENCOUNTER — Encounter (HOSPITAL_COMMUNITY): Payer: Self-pay | Admitting: Cardiovascular Disease

## 2015-07-23 LAB — CULTURE, BLOOD (ROUTINE X 2)
CULTURE: NO GROWTH
CULTURE: NO GROWTH

## 2015-07-24 LAB — CULTURE, BODY FLUID-BOTTLE

## 2015-07-24 LAB — CULTURE, BODY FLUID W GRAM STAIN -BOTTLE: Culture: NO GROWTH

## 2015-07-26 LAB — CULTURE, BLOOD (ROUTINE X 2)
CULTURE: NO GROWTH
CULTURE: NO GROWTH

## 2015-08-18 ENCOUNTER — Encounter (HOSPITAL_COMMUNITY): Payer: Self-pay | Admitting: Emergency Medicine

## 2015-08-18 ENCOUNTER — Emergency Department (HOSPITAL_COMMUNITY)
Admission: EM | Admit: 2015-08-18 | Discharge: 2015-08-18 | Disposition: A | Discharge status: Home / Self Care | Payer: Self-pay | Attending: Emergency Medicine | Admitting: Emergency Medicine

## 2015-08-18 DIAGNOSIS — M109 Gout, unspecified: Secondary | ICD-10-CM

## 2015-08-18 DIAGNOSIS — Z8669 Personal history of other diseases of the nervous system and sense organs: Secondary | ICD-10-CM | POA: Insufficient documentation

## 2015-08-18 DIAGNOSIS — K219 Gastro-esophageal reflux disease without esophagitis: Secondary | ICD-10-CM | POA: Insufficient documentation

## 2015-08-18 DIAGNOSIS — Z8639 Personal history of other endocrine, nutritional and metabolic disease: Secondary | ICD-10-CM | POA: Insufficient documentation

## 2015-08-18 DIAGNOSIS — Z7982 Long term (current) use of aspirin: Secondary | ICD-10-CM | POA: Insufficient documentation

## 2015-08-18 DIAGNOSIS — Z79899 Other long term (current) drug therapy: Secondary | ICD-10-CM | POA: Insufficient documentation

## 2015-08-18 DIAGNOSIS — I209 Angina pectoris, unspecified: Secondary | ICD-10-CM | POA: Insufficient documentation

## 2015-08-18 DIAGNOSIS — M10061 Idiopathic gout, right knee: Secondary | ICD-10-CM | POA: Insufficient documentation

## 2015-08-18 DIAGNOSIS — I10 Essential (primary) hypertension: Secondary | ICD-10-CM | POA: Insufficient documentation

## 2015-08-18 DIAGNOSIS — Z91040 Latex allergy status: Secondary | ICD-10-CM | POA: Insufficient documentation

## 2015-08-18 DIAGNOSIS — Z87891 Personal history of nicotine dependence: Secondary | ICD-10-CM | POA: Insufficient documentation

## 2015-08-18 MED ORDER — OXYCODONE-ACETAMINOPHEN 5-325 MG PO TABS: 1.0000 | ORAL_TABLET | Freq: Four times a day (QID) | ORAL | Status: DC | PRN

## 2015-08-18 MED ORDER — HYDROMORPHONE HCL 1 MG/ML IJ SOLN
1.0000 mg | Freq: Once | INTRAMUSCULAR | Status: AC
  Administered 2015-08-18: 1 mg via INTRAMUSCULAR
  Filled 2015-08-18: qty 1

## 2015-08-18 MED ORDER — COLCHICINE 0.6 MG PO TABS
1.2000 mg | ORAL_TABLET | Freq: Once | ORAL | Status: AC
  Administered 2015-08-18: 1.2 mg via ORAL
  Filled 2015-08-18: qty 2

## 2015-08-18 MED ORDER — METHYLPREDNISOLONE 4 MG PO TBPK: ORAL_TABLET | ORAL | Status: DC

## 2015-08-18 MED ORDER — COLCHICINE 0.6 MG PO TABS: 0.6000 mg | ORAL_TABLET | Freq: Every day | ORAL | Status: DC

## 2015-08-18 NOTE — ED Provider Notes (Signed)
CSN: [id number]     Arrival date & time 08/18/15  1320 History  By signing my name below, I, Placido Sou, attest that this documentation has been prepared under the direction and in the presence of [employer], PA-C. Electronically Signed: Placido Sou, ED Scribe. 08/18/2015. 2:02 PM.   Chief Complaint  Patient presents with  . Knee Pain   The history is provided by the patient and the spouse. No language interpreter was used.    HPI Comments: [name] is a 55 y.o. male with a PMHx of gout who presents to the Emergency Department by ambulance complaining of worsening, moderate, right knee pain x 2 days. Pt was hospitalized in April of 2017 for sepsis due to an unspecified organism and had the affected knee tapped at that time, which showed Gout.  He is currently taking tylenol with his last dose this morning and denies any relief. He was taking colchicine until he ran out yesterday but states that he was experiencing minor gout flare ups while taking the medication. He denies recent fever.  Temp is 99.3 F upon arrival in the ED. Pt denies a hx of smoking or ETOH consumption but does confirm intermittent consumption of red meat. He had hamburger meat and steak last week.  He denies n/v. Denies any numbness or tingling of lower extremity.   Past Medical History  Diagnosis Date  . High cholesterol   . Aortic stenosis 2013    mild in 2013  . Angina   . Exertional dyspnea   . Gout     "sometimes flares up even w/Allupurinol"  . Hypertension   . GERD (gastroesophageal reflux disease)   . Sleep apnea 08/2010    "not required to wear mask"   Past Surgical History  Procedure Laterality Date  . Upper gastrointestinal endoscopy  03/2011  . Colonoscopy  03/2011  . Knee arthroscopy Right 2004    "w/ligament repair in kneecap"  . Multiple tooth extractions  06/2010    full mouth  . Tonsillectomy         . Tee without cardioversion N/A 07/22/2015    Procedure: TRANSESOPHAGEAL  ECHOCARDIOGRAM (TEE);  Surgeon: Wendall Stade, MD;  Location: [facility] ENDOSCOPY;  Service: Cardiovascular;  Laterality: N/A;   Family History  Problem Relation Age of Onset  . Hypertension    . Asthma Daughter    Social History  Substance Use Topics  . Smoking status: Former Smoker -- .1 years    Types: Cigarettes    Quit date: 04/04/1976  . Smokeless tobacco: Never Used  . Alcohol Use: No    Review of Systems  Gastrointestinal: Negative for nausea and vomiting.  Musculoskeletal: Positive for joint swelling and arthralgias.  Skin: Positive for color change. Negative for wound.    Allergies  Adhesive and Latex  Home Medications   Prior to Admission medications   Medication Sig Start Date End Date Taking? Authorizing Provider  acetaminophen (TYLENOL) 325 MG tablet Take 2 tablets (650 mg total) by mouth every 6 (six) hours as needed for moderate pain. 07/21/15   Leroy Sea, MD  amLODipine (NORVASC) 10 MG tablet Take 1 tablet (10 mg total) by mouth daily. 07/21/15   Leroy Sea, MD  aspirin 81 MG chewable tablet Chew 1 tablet (81 mg total) by mouth daily. 07/21/15   Leroy Sea, MD  carvedilol (COREG) 12.5 MG tablet Take 1 tablet (12.5 mg total) by mouth 2 (two) times daily with a meal. 07/21/15  Leroy SeaPrashant K Singh, MD  colchicine 0.6 MG tablet Please take once daily as needed for gout pain 07/21/15   Leroy SeaPrashant K Singh, MD  hydrALAZINE (APRESOLINE) 50 MG tablet Take 1 tablet (50 mg total) by mouth every 8 (eight) hours. 07/21/15   Leroy SeaPrashant K Singh, MD  hydrochlorothiazide (HYDRODIURIL) 25 MG tablet Take 1 tablet (25 mg total) by mouth daily. 07/22/15   Leroy SeaPrashant K Singh, MD  lisinopril (PRINIVIL,ZESTRIL) 20 MG tablet Take 1 tablet (20 mg total) by mouth once. 07/21/15   Leroy SeaPrashant K Singh, MD  methylPREDNISolone (MEDROL DOSEPAK) 4 MG TBPK tablet follow package directions 07/21/15   Leroy SeaPrashant K Singh, MD  pantoprazole (PROTONIX) 40 MG tablet Take 1 tablet (40 mg total) by mouth daily.  07/21/15   Leroy SeaPrashant K Singh, MD   BP 167/110 mmHg  Pulse 94  Temp(Src) 99.3 F (37.4 C)  Resp 18  SpO2 97%   Physical Exam  Constitutional: He is oriented to person, place, and time. He appears well-developed and well-nourished.  HENT:  Head: Normocephalic and atraumatic.  Eyes: EOM are normal.  Neck: Normal range of motion.  Cardiovascular: Normal rate, regular rhythm and normal heart sounds.  Exam reveals no gallop and no friction rub.   No murmur heard. Pulses:      Dorsalis pedis pulses are 2+ on the right side.  Pulmonary/Chest: Effort normal and breath sounds normal. No respiratory distress. He has no wheezes. He has no rales. He exhibits no tenderness.  Abdominal: Soft.  Musculoskeletal: Normal range of motion. He exhibits edema.  Right knee is erythematous, tender and warm to the touch.  Neurological: He is alert and oriented to person, place, and time.  Sensation intact to the toes of the right foot  Skin: Skin is warm and dry. There is erythema.  Psychiatric: He has a normal mood and affect.  Nursing note and vitals reviewed.  ED Course  Procedures  DIAGNOSTIC STUDIES: Oxygen Saturation is 97% on RA, normal by my interpretation.    COORDINATION OF CARE: 1:59 PM Discussed next steps with pt. He verbalized understanding and is agreeable with the plan.   2:42 PM Pt reevaluated following Dilaudid injection. He states his pain has improved but still reports pain with ROM of the right knee. He has FROM of his right knee. Rechecked his temperature which is 99.0 F orally.    Labs Review Labs Reviewed - No data to display  Imaging Review No results found. I have personally reviewed and evaluated these images and lab results as part of my medical decision-making.   EKG Interpretation None      MDM   Final diagnoses:  None  Patient with a history of Gout presents today with erythema, swelling, and warmth of the right knee.  He reports that he recently ate red meat  and that he is no longer on the Colchicine.  Symptoms most consistent with Gout.  He is afebrile with good ROM.  Therefore, doubt septic joint.  Feel that the patient is stable for discharge.  Return precautions given.    I personally performed the services described in this documentation, which was scribed in my presence. The recorded information has been reviewed and is accurate.    Santiago GladHeather Addison Whidbee, PA-C 08/19/15 2145  Leta BaptistEmily Roe Nguyen, MD 08/30/15 [phone]

## 2015-08-18 NOTE — ED Notes (Signed)
Pt c/o "gout" x 2-3 days.Hx of gout. Right knee red and swollen.

## 2015-09-07 ENCOUNTER — Emergency Department (HOSPITAL_COMMUNITY)
Admission: EM | Admit: 2015-09-07 | Discharge: 2015-09-08 | Disposition: A | Discharge status: Home / Self Care | Payer: Self-pay | Attending: Emergency Medicine | Admitting: Emergency Medicine

## 2015-09-07 ENCOUNTER — Encounter (HOSPITAL_COMMUNITY): Payer: Self-pay | Admitting: Emergency Medicine

## 2015-09-07 ENCOUNTER — Emergency Department (HOSPITAL_COMMUNITY)
Admission: EM | Admit: 2015-09-07 | Discharge: 2015-09-07 | Disposition: A | Discharge status: Home / Self Care | Payer: Self-pay | Attending: Emergency Medicine | Admitting: Emergency Medicine

## 2015-09-07 DIAGNOSIS — I10 Essential (primary) hypertension: Secondary | ICD-10-CM | POA: Insufficient documentation

## 2015-09-07 DIAGNOSIS — Z87891 Personal history of nicotine dependence: Secondary | ICD-10-CM | POA: Insufficient documentation

## 2015-09-07 DIAGNOSIS — K219 Gastro-esophageal reflux disease without esophagitis: Secondary | ICD-10-CM | POA: Insufficient documentation

## 2015-09-07 DIAGNOSIS — R011 Cardiac murmur, unspecified: Secondary | ICD-10-CM | POA: Insufficient documentation

## 2015-09-07 DIAGNOSIS — Z79899 Other long term (current) drug therapy: Secondary | ICD-10-CM | POA: Insufficient documentation

## 2015-09-07 DIAGNOSIS — M10062 Idiopathic gout, left knee: Secondary | ICD-10-CM | POA: Insufficient documentation

## 2015-09-07 DIAGNOSIS — Z91040 Latex allergy status: Secondary | ICD-10-CM | POA: Insufficient documentation

## 2015-09-07 DIAGNOSIS — Z7982 Long term (current) use of aspirin: Secondary | ICD-10-CM | POA: Insufficient documentation

## 2015-09-07 DIAGNOSIS — Z8669 Personal history of other diseases of the nervous system and sense organs: Secondary | ICD-10-CM | POA: Insufficient documentation

## 2015-09-07 DIAGNOSIS — I209 Angina pectoris, unspecified: Secondary | ICD-10-CM | POA: Insufficient documentation

## 2015-09-07 DIAGNOSIS — Z8639 Personal history of other endocrine, nutritional and metabolic disease: Secondary | ICD-10-CM | POA: Insufficient documentation

## 2015-09-07 DIAGNOSIS — M109 Gout, unspecified: Secondary | ICD-10-CM

## 2015-09-07 MED ORDER — HYDROCODONE-ACETAMINOPHEN 5-325 MG PO TABS: 1.0000 | ORAL_TABLET | Freq: Four times a day (QID) | ORAL | Status: DC | PRN

## 2015-09-07 MED ORDER — COLCHICINE 0.6 MG PO CAPS: 0.6000 mg | ORAL_CAPSULE | Freq: Every day | ORAL | Status: DC

## 2015-09-07 MED ORDER — PREDNISONE 20 MG PO TABS: ORAL_TABLET | ORAL | Status: DC

## 2015-09-07 NOTE — ED Provider Notes (Signed)
CSN: [id number]     Arrival date & time 09/07/15  0750 History   First MD Initiated Contact with Patient 09/07/15 0757     Chief Complaint  Patient presents with  . Gout     (Consider location/radiation/quality/duration/timing/severity/associated sxs/prior Treatment) HPI Patient noted pain in his left calf on Friday, by Saturday the left knee started hurting and it has progressively worsened. His knee started swelling and feeling warm around Sunday afternoon. He denies erythema.   The gout pain normally progresses like this. Subjective fevers.   He's been wearing CopperFits to help with the pain. He was taking ASA, Tyelenol, and his wife's gabapentin. No recent trauma to the left knee.   Of note, the patient was admitted in April 2017 being surgical criteria found to have left knee gout from the joint analysis. He was placed on systemic steroids along with colchicine at that time. He was also seen in the ED on 08/18/2015 due to worsening right knee pain and swelling. At that time he was prescribed a Medrol Dosepak, colchicine, and Percocet. He doesn't recall picking up any of these medications.   Past Medical History  Diagnosis Date  . High cholesterol   . Aortic stenosis 2013    mild in 2013  . Angina   . Exertional dyspnea   . Gout     "sometimes flares up even w/Allupurinol"  . Hypertension   . GERD (gastroesophageal reflux disease)   . Sleep apnea 08/2010    "not required to wear mask"   Past Surgical History  Procedure Laterality Date  . Upper gastrointestinal endoscopy  03/2011  . Colonoscopy  03/2011  . Knee arthroscopy Right 2004    "w/ligament repair in kneecap"  . Multiple tooth extractions  06/2010    full mouth  . Tonsillectomy         . Tee without cardioversion N/A 07/22/2015    Procedure: TRANSESOPHAGEAL ECHOCARDIOGRAM (TEE);  Surgeon: Wendall Stade, MD;  Location: [facility] ENDOSCOPY;  Service: Cardiovascular;  Laterality: N/A;   Family History  Problem Relation  Age of Onset  . Hypertension    . Asthma Daughter    Social History  Substance Use Topics  . Smoking status: Former Smoker -- .1 years    Types: Cigarettes    Quit date: 04/04/1976  . Smokeless tobacco: Never Used  . Alcohol Use: No    Review of Systems  Constitutional: Negative for fever, chills, diaphoresis and appetite change.  HENT: Negative for ear discharge and sinus pressure.   Eyes: Negative for photophobia and redness.  Respiratory: Negative for chest tightness and shortness of breath.   Cardiovascular: Negative for chest pain, palpitations and leg swelling.  Gastrointestinal: Negative for nausea, vomiting, abdominal pain and abdominal distention.  Endocrine: Negative for polydipsia.  Genitourinary: Negative for dysuria, flank pain and decreased urine volume.  Musculoskeletal: Positive for joint swelling, arthralgias and gait problem. Negative for myalgias and back pain.  Skin: Negative for rash.  Allergic/Immunologic: Negative for immunocompromised state.  Neurological: Negative for weakness, numbness and headaches.  Psychiatric/Behavioral: The patient is not nervous/anxious.       Allergies  Adhesive and Latex  Home Medications   Prior to Admission medications   Medication Sig Start Date End Date Taking? Authorizing Provider  acetaminophen (TYLENOL) 325 MG tablet Take 2 tablets (650 mg total) by mouth every 6 (six) hours as needed for moderate pain. 07/21/15   Leroy Sea, MD  amLODipine (NORVASC) 10 MG tablet Take 1 tablet (  10 mg total) by mouth daily. 07/21/15   Leroy SeaPrashant K Singh, MD  aspirin 81 MG chewable tablet Chew 1 tablet (81 mg total) by mouth daily. 07/21/15   Leroy SeaPrashant K Singh, MD  carvedilol (COREG) 12.5 MG tablet Take 1 tablet (12.5 mg total) by mouth 2 (two) times daily with a meal. 07/21/15   Leroy SeaPrashant K Singh, MD  Colchicine 0.6 MG CAPS Take 0.6 mg by mouth daily. 09/07/15   Joanna Puffrystal S Dorsey, MD  hydrALAZINE (APRESOLINE) 50 MG tablet Take 1 tablet (50  mg total) by mouth every 8 (eight) hours. 07/21/15   Leroy SeaPrashant K Singh, MD  hydrochlorothiazide (HYDRODIURIL) 25 MG tablet Take 1 tablet (25 mg total) by mouth daily. 07/22/15   Leroy SeaPrashant K Singh, MD  HYDROcodone-acetaminophen (NORCO) 5-325 MG tablet Take 1 tablet by mouth every 6 (six) hours as needed for moderate pain. 09/07/15   Joanna Puffrystal S Dorsey, MD  lisinopril (PRINIVIL,ZESTRIL) 20 MG tablet Take 1 tablet (20 mg total) by mouth once. 07/21/15   Leroy SeaPrashant K Singh, MD  oxyCODONE-acetaminophen (PERCOCET/ROXICET) 5-325 MG tablet Take 1-2 tablets by mouth every 6 (six) hours as needed for severe pain. 08/18/15   Heather Laisure, PA-C  pantoprazole (PROTONIX) 40 MG tablet Take 1 tablet (40 mg total) by mouth daily. 07/21/15   Leroy SeaPrashant K Singh, MD  predniSONE (DELTASONE) 20 MG tablet Take 60mg  x 2 days, 40mg  x 2 days, 20mg  x 3 days 09/07/15   Joanna Puffrystal S Dorsey, MD   BP 180/109 mmHg  Pulse 72  Temp(Src) 98.8 F (37.1 C)  SpO2 97% Physical Exam  Constitutional: He is oriented to person, place, and time. He appears well-developed. No distress.  HENT:  Head: Normocephalic.  Mouth/Throat: No oropharyngeal exudate.  Eyes: Conjunctivae are normal. Pupils are equal, round, and reactive to light. Right eye exhibits no discharge. Left eye exhibits no discharge. No scleral icterus.  Neck: Normal range of motion. Neck supple.  Cardiovascular: Normal rate.  Exam reveals no gallop and no friction rub.   Murmur heard. 2/6 systolic murmur  Pulmonary/Chest: Effort normal and breath sounds normal. No respiratory distress. He has no wheezes. He has no rales.  Abdominal: Soft. Bowel sounds are normal. He exhibits no distension. There is no tenderness. There is no rebound and no guarding.  Musculoskeletal:  Decreased flexion in both knees, more prominent on the left than the right. Significant effusion noted on the left without erythema or warmth.   Lymphadenopathy:    He has no cervical adenopathy.  Neurological: He is  alert and oriented to person, place, and time. No cranial nerve deficit.  Skin: Skin is warm. No rash noted. He is not diaphoretic.  Psychiatric: He has a normal mood and affect.    ED Course  Procedures (including critical care time) Labs Review Labs Reviewed - No data to display  Imaging Review No results found. I have personally reviewed and evaluated these images and lab results as part of my medical decision-making.   EKG Interpretation None      MDM   Final diagnoses:  Acute gout of left knee, unspecified cause    This is a 55 year old gentleman with past medical history of gout presenting with a left knee swelling and pain for the last 3 days.  His presentation is consistent with his previous gout flares. Unfortunately he was unable to afford any medications during his last ED visit, however his right knee swelling warmth, and erythema resolved spontaneously.  No no fevers, chills, or other systemic  symptoms concerning for a septic joint. Prescribed colchicine 0.6 mg daily and provided a prescription coupon. Also prescribed a steroid taper in hopes that it would be cheaper than it Medrol Dosepak. Prescribed Norco every 6 hours PRN pain, quantity 6. Discussed return precautions. Patient currently does not have a PCP due to lack of insurance. Discussed attempting to establish with community health and wellness.   Joanna Puff, MD [facility] Family Medicine Resident  09/07/2015, 9:20 AM     Joanna Puff, MD 09/07/15 1610  Lyndal Pulley, MD 09/08/15 [phone]

## 2015-09-07 NOTE — ED Notes (Signed)
EDP aware of patient BP 

## 2015-09-07 NOTE — ED Notes (Signed)
Pt brought in by [facility] with c/o left knee pain  Pt was seen at [facility] earlier today for same and was given a script that he did not get filled  Pt states the pain got worse so he decided to come here  Pt states they did nothing at [facility] other than look at his leg and give him a script and did not give him anything for pain while he was there

## 2015-09-07 NOTE — Discharge Instructions (Signed)
Your exam is consistent with a gout flare. I have prescribed 3 medications for you: The first is a prednisone taper, this will be very helpful in helping with her gout flare. The second is the colchicine that you take daily, this will be the fastest acting medication to help with your flare and there is a coupon available to help you pay for this medication. The last is Norco which is solely used for pain. It will not help the gout go away, but it will help with pain.  Gout Gout is an inflammatory arthritis caused by a buildup of uric acid crystals in the joints. Uric acid is a chemical that is normally present in the blood. When the level of uric acid in the blood is too high it can form crystals that deposit in your joints and tissues. This causes joint redness, soreness, and swelling (inflammation). Repeat attacks are common. Over time, uric acid crystals can form into masses (tophi) near a joint, destroying bone and causing disfigurement. Gout is treatable and often preventable. CAUSES  The disease begins with elevated levels of uric acid in the blood. Uric acid is produced by your body when it breaks down a naturally found substance called purines. Certain foods you eat, such as meats and fish, contain high amounts of purines. Causes of an elevated uric acid level include:  Being passed down from parent to child (heredity).  Diseases that cause increased uric acid production (such as obesity, psoriasis, and certain cancers).  Excessive alcohol use.  Diet, especially diets rich in meat and seafood.  Medicines, including certain cancer-fighting medicines (chemotherapy), water pills (diuretics), and aspirin.  Chronic kidney disease. The kidneys are no longer able to remove uric acid well.  Problems with metabolism. Conditions strongly associated with gout include:  Obesity.  High blood pressure.  High cholesterol.  Diabetes. Not everyone with elevated uric acid levels gets gout. It is  not understood why some people get gout and others do not. Surgery, joint injury, and eating too much of certain foods are some of the factors that can lead to gout attacks. SYMPTOMS   An attack of gout comes on quickly. It causes intense pain with redness, swelling, and warmth in a joint.  Fever can occur.  Often, only one joint is involved. Certain joints are more commonly involved:  Base of the big toe.  Knee.  Ankle.  Wrist.  Finger. Without treatment, an attack usually goes away in a few days to weeks. Between attacks, you usually will not have symptoms, which is different from many other forms of arthritis. DIAGNOSIS  Your caregiver will suspect gout based on your symptoms and exam. In some cases, tests may be recommended. The tests may include:  Blood tests.  Urine tests.  X-rays.  Joint fluid exam. This exam requires a needle to remove fluid from the joint (arthrocentesis). Using a microscope, gout is confirmed when uric acid crystals are seen in the joint fluid. TREATMENT  There are two phases to gout treatment: treating the sudden onset (acute) attack and preventing attacks (prophylaxis).  Treatment of an Acute Attack.  Medicines are used. These include anti-inflammatory medicines or steroid medicines.  An injection of steroid medicine into the affected joint is sometimes necessary.  The painful joint is rested. Movement can worsen the arthritis.  You may use warm or cold treatments on painful joints, depending which works best for you.  Treatment to Prevent Attacks.  If you suffer from frequent gout attacks, your caregiver  may advise preventive medicine. These medicines are started after the acute attack subsides. These medicines either help your kidneys eliminate uric acid from your body or decrease your uric acid production. You may need to stay on these medicines for a very long time.  The early phase of treatment with preventive medicine can be associated  with an increase in acute gout attacks. For this reason, during the first few months of treatment, your caregiver may also advise you to take medicines usually used for acute gout treatment. Be sure you understand your caregiver's directions. Your caregiver may make several adjustments to your medicine dose before these medicines are effective.  Discuss dietary treatment with your caregiver or dietitian. Alcohol and drinks high in sugar and fructose and foods such as meat, poultry, and seafood can increase uric acid levels. Your caregiver or dietitian can advise you on drinks and foods that should be limited. HOME CARE INSTRUCTIONS   Do not take aspirin to relieve pain. This raises uric acid levels.  Only take over-the-counter or prescription medicines for pain, discomfort, or fever as directed by your caregiver.  Rest the joint as much as possible. When in bed, keep sheets and blankets off painful areas.  Keep the affected joint raised (elevated).  Apply warm or cold treatments to painful joints. Use of warm or cold treatments depends on which works best for you.  Use crutches if the painful joint is in your leg.  Drink enough fluids to keep your urine clear or pale yellow. This helps your body get rid of uric acid. Limit alcohol, sugary drinks, and fructose drinks.  Follow your dietary instructions. Pay careful attention to the amount of protein you eat. Your daily diet should emphasize fruits, vegetables, whole grains, and fat-free or low-fat milk products. Discuss the use of coffee, vitamin C, and cherries with your caregiver or dietitian. These may be helpful in lowering uric acid levels.  Maintain a healthy body weight. SEEK MEDICAL CARE IF:   You develop diarrhea, vomiting, or any side effects from medicines.  You do not feel better in 24 hours, or you are getting worse. SEEK IMMEDIATE MEDICAL CARE IF:   Your joint becomes suddenly more tender, and you have chills or a  fever. MAKE SURE YOU:   Understand these instructions.  Will watch your condition.  Will get help right away if you are not doing well or get worse.   This information is not intended to replace advice given to you by your health care provider. Make sure you discuss any questions you have with your health care provider.   Document Released: 03/18/2000 Document Revised: 04/11/2014 Document Reviewed: 11/02/2011 Elsevier Interactive Patient Education [employer] Elsevier Inc.

## 2015-09-07 NOTE — ED Notes (Signed)
Patient comes from home states yesterday the gout in left leg started "acting up" patient states was prescribe medication but states he could not afford so he has been doing self remedies. Patient has limited ROM states painful to bend knee. Patient rates pain 10/10

## 2015-09-08 LAB — SYNOVIAL CELL COUNT + DIFF, W/ CRYSTALS
EOSINOPHILS-SYNOVIAL: 0 % (ref 0–1)
LYMPHOCYTES-SYNOVIAL FLD: 2 % (ref 0–20)
MONOCYTE-MACROPHAGE-SYNOVIAL FLUID: 5 % — AB (ref 50–90)
NEUTROPHIL, SYNOVIAL: 93 % — AB (ref 0–25)
WBC, SYNOVIAL: 19120 /mm3 — AB (ref 0–200)

## 2015-09-08 LAB — CBC WITH DIFFERENTIAL/PLATELET
BASOS PCT: 0 %
Basophils Absolute: 0 10*3/uL (ref 0.0–0.1)
EOS ABS: 0 10*3/uL (ref 0.0–0.7)
EOS PCT: 0 %
HCT: 34 % — ABNORMAL LOW (ref 39.0–52.0)
HEMOGLOBIN: 11.6 g/dL — AB (ref 13.0–17.0)
LYMPHS PCT: 21 %
Lymphs Abs: 2 10*3/uL (ref 0.7–4.0)
MCH: 23.4 pg — AB (ref 26.0–34.0)
MCHC: 34.1 g/dL (ref 30.0–36.0)
MCV: 68.5 fL — AB (ref 78.0–100.0)
MONO ABS: 0.9 10*3/uL (ref 0.1–1.0)
Monocytes Relative: 10 %
NEUTROS ABS: 6.4 10*3/uL (ref 1.7–7.7)
NEUTROS PCT: 69 %
Platelets: 149 10*3/uL — ABNORMAL LOW (ref 150–400)
RBC: 4.96 MIL/uL (ref 4.22–5.81)
RDW: 14.5 % (ref 11.5–15.5)
WBC: 9.3 10*3/uL (ref 4.0–10.5)

## 2015-09-08 LAB — BASIC METABOLIC PANEL
Anion gap: 9 (ref 5–15)
BUN: 10 mg/dL (ref 6–20)
CHLORIDE: 103 mmol/L (ref 101–111)
CO2: 24 mmol/L (ref 22–32)
CREATININE: 0.76 mg/dL (ref 0.61–1.24)
Calcium: 8.7 mg/dL — ABNORMAL LOW (ref 8.9–10.3)
GFR calc Af Amer: >60 mL/min (ref >60)
GFR calc non Af Amer: >60 mL/min (ref >60)
GLUCOSE: 109 mg/dL — AB (ref 65–99)
POTASSIUM: 2.8 mmol/L — AB (ref 3.5–5.1)
SODIUM: 136 mmol/L (ref 135–145)

## 2015-09-08 MED ORDER — FENTANYL CITRATE (PF) 100 MCG/2ML IJ SOLN
100.0000 ug | Freq: Once | INTRAMUSCULAR | Status: AC
  Administered 2015-09-08: 100 ug via INTRAVENOUS
  Filled 2015-09-08: qty 2

## 2015-09-08 MED ORDER — METHYLPREDNISOLONE SODIUM SUCC 125 MG IJ SOLR: 125.0000 mg | Freq: Once | INTRAMUSCULAR | Status: DC

## 2015-09-08 MED ORDER — POTASSIUM CHLORIDE CRYS ER 20 MEQ PO TBCR
40.0000 meq | EXTENDED_RELEASE_TABLET | Freq: Once | ORAL | Status: AC
  Administered 2015-09-08: 40 meq via ORAL
  Filled 2015-09-08: qty 2

## 2015-09-08 MED ORDER — METHYLPREDNISOLONE SODIUM SUCC 125 MG IJ SOLR
125.0000 mg | Freq: Once | INTRAMUSCULAR | Status: AC
  Administered 2015-09-08: 125 mg via INTRAVENOUS
  Filled 2015-09-08: qty 2

## 2015-09-08 MED ORDER — LIDOCAINE-EPINEPHRINE (PF) 1 %-1:200000 IJ SOLN
INTRAMUSCULAR | Status: AC
  Administered 2015-09-08: 30 mL
  Filled 2015-09-08: qty 30

## 2015-09-08 MED ORDER — LIDOCAINE-EPINEPHRINE (PF) 2 %-1:200000 IJ SOLN
20.0000 mL | Freq: Once | INTRAMUSCULAR | Status: AC
  Administered 2015-09-08: 20 mL via INTRADERMAL

## 2015-09-08 NOTE — ED Provider Notes (Signed)
CSN: [id number]     Arrival date & time 09/07/15  2249 History  By signing my name below, I, [name], attest that this documentation has been prepared under the direction and in the presence of Paula LibraJohn Saroya Riccobono, MD. Electronically Signed: Evon Slackerrance Branch, ED Scribe. 09/08/2015. 2:50 AM.      Chief Complaint  Patient presents with  . Knee Pain   Patient is a 55 y.o. male presenting with knee pain. The history is provided by the patient and a relative. No language interpreter was used.  Knee Pain  HPI Comments: [name] is a 55 y.o. male brought in by ambulance, who presents to the Emergency Department complaining of left knee pain and swelling onset 3 days prior. Family member reports fever to 102 yesterday. Pt states that he has a Hx of gout in left knee and the pain is similar. He rates it as severe, worse with movement or palpation. There is associated effusion. Pt was [facility] for similar symptoms but did not get his prescriptions filled. He called an ambulance because his symptoms acutely worsened and he is unable to bear weight on left leg.  Past Medical History  Diagnosis Date  . High cholesterol   . Aortic stenosis 2013    mild in 2013  . Angina   . Exertional dyspnea   . Gout     "sometimes flares up even w/Allupurinol"  . Hypertension   . GERD (gastroesophageal reflux disease)   . Sleep apnea 08/2010    "not required to wear mask"   Past Surgical History  Procedure Laterality Date  . Upper gastrointestinal endoscopy  03/2011  . Colonoscopy  03/2011  . Knee arthroscopy Right 2004    "w/ligament repair in kneecap"  . Multiple tooth extractions  06/2010    full mouth  . Tonsillectomy         . Tee without cardioversion N/A 07/22/2015    Procedure: TRANSESOPHAGEAL ECHOCARDIOGRAM (TEE);  Surgeon: Wendall StadePeter C Nishan, MD;  Location: [facility] ENDOSCOPY;  Service: Cardiovascular;  Laterality: N/A;   Family History  Problem Relation Age of Onset  . Hypertension    .  Asthma Daughter    Social History  Substance Use Topics  . Smoking status: Former Smoker -- .1 years    Types: Cigarettes    Quit date: 04/04/1976  . Smokeless tobacco: Never Used  . Alcohol Use: No    Review of Systems A complete 10 system review of systems was obtained and all systems are negative except as noted in the HPI and PMH.    Allergies  Adhesive and Latex  Home Medications   Prior to Admission medications   Medication Sig Start Date End Date Taking? Authorizing Provider  acetaminophen (TYLENOL) 325 MG tablet Take 2 tablets (650 mg total) by mouth every 6 (six) hours as needed for moderate pain. 07/21/15  Yes Leroy SeaPrashant K Singh, MD  amLODipine (NORVASC) 10 MG tablet Take 1 tablet (10 mg total) by mouth daily. 07/21/15  Yes Leroy SeaPrashant K Singh, MD  aspirin 81 MG chewable tablet Chew 1 tablet (81 mg total) by mouth daily. 07/21/15  Yes Leroy SeaPrashant K Singh, MD  hydrochlorothiazide (HYDRODIURIL) 25 MG tablet Take 1 tablet (25 mg total) by mouth daily. 07/22/15  Yes Leroy SeaPrashant K Singh, MD  lisinopril (PRINIVIL,ZESTRIL) 20 MG tablet Take 1 tablet (20 mg total) by mouth once. 07/21/15  Yes Leroy SeaPrashant K Singh, MD  omeprazole (PRILOSEC) 40 MG capsule Take 40 mg by mouth daily.  Yes Historical Provider, MD  carvedilol (COREG) 12.5 MG tablet Take 1 tablet (12.5 mg total) by mouth 2 (two) times daily with a meal. 07/21/15   Leroy Sea, MD  Colchicine 0.6 MG CAPS Take 0.6 mg by mouth daily. Patient not taking: Reported on 09/08/2015 09/07/15   Joanna Puff, MD  hydrALAZINE (APRESOLINE) 50 MG tablet Take 1 tablet (50 mg total) by mouth every 8 (eight) hours. 07/21/15   Leroy Sea, MD  HYDROcodone-acetaminophen (NORCO) 5-325 MG tablet Take 1 tablet by mouth every 6 (six) hours as needed for moderate pain. Patient not taking: Reported on 09/08/2015 09/07/15   Joanna Puff, MD  oxyCODONE-acetaminophen (PERCOCET/ROXICET) 5-325 MG tablet Take 1-2 tablets by mouth every 6 (six) hours as needed for  severe pain. 08/18/15   Heather Laisure, PA-C  pantoprazole (PROTONIX) 40 MG tablet Take 1 tablet (40 mg total) by mouth daily. 07/21/15   Leroy Sea, MD  predniSONE (DELTASONE) 20 MG tablet Take 60mg  x 2 days, 40mg  x 2 days, 20mg  x 3 days 09/07/15   Joanna Puff, MD   BP 179/107 mmHg  Pulse 85  Temp(Src) 99.5 F (37.5 C) (Oral)  Resp 19  Ht 5\' 9"  (1.753 m)  Wt 210 lb (95.255 kg)  BMI 31.00 kg/m2  SpO2 95%   Physical Exam  General: Well-developed, well-nourished male in no acute distress; appearance consistent with age of record HENT: normocephalic; atraumatic Eyes: pupils equal, round and reactive to light; extraocular muscles intact Neck: supple Heart: regular rate and rhythm Lungs: clear to auscultation bilaterally Abdomen: soft; nondistended; nontender; no masses or hepatosplenomegaly; bowel sounds present Extremities: No deformity; full range of motionExcept left knee; pulses normal; left knee tender with palpable effusion and warmth but no erythema Neurologic: Awake, alert and oriented; motor function intact in all extremities and symmetric; no facial droop Skin: Warm and dry Psychiatric: Normal mood and affect    ED Course  Procedures (including critical care time)  KNEE ASPIRATION After informed verbal consent was obtained the patient's left knee was prepped and draped in the usual sterile fashion. The skin overlying the medial aspect of the patella was anesthetized with approximately 3 milliliters of 1% lidocaine with epinephrine. An 18-gauge needle was then used and are the synovial space and approximately 30 milliliters of cloudy, straw-colored synovial fluid were obtained. The fluid was sent to the laboratory for analysis. The patient tolerated this well and there were no immediate complications.  MDM  Nursing notes and vitals signs, including pulse oximetry, reviewed.  Summary of this visit's results, reviewed by myself:  Labs:  Results for orders placed or  performed during the hospital encounter of 09/07/15 (from the past 24 hour(s))  CBC with Differential/Platelet     Status: Abnormal   Collection Time: 09/08/15  3:10 AM  Result Value Ref Range   WBC 9.3 4.0 - 10.5 K/uL   RBC 4.96 4.22 - 5.81 MIL/uL   Hemoglobin 11.6 (L) 13.0 - 17.0 g/dL   HCT 45.4 (L) 09.8 - 11.9 %   MCV 68.5 (L) 78.0 - 100.0 fL   MCH 23.4 (L) 26.0 - 34.0 pg   MCHC 34.1 30.0 - 36.0 g/dL   RDW 14.7 82.9 - 56.2 %   Platelets 149 (L) 150 - 400 K/uL   Neutrophils Relative % 69 %   Lymphocytes Relative 21 %   Monocytes Relative 10 %   Eosinophils Relative 0 %   Basophils Relative 0 %   Neutro  Abs 6.4 1.7 - 7.7 K/uL   Lymphs Abs 2.0 0.7 - 4.0 K/uL   Monocytes Absolute 0.9 0.1 - 1.0 K/uL   Eosinophils Absolute 0.0 0.0 - 0.7 K/uL   Basophils Absolute 0.0 0.0 - 0.1 K/uL   RBC Morphology TARGET CELLS   Basic metabolic panel     Status: Abnormal   Collection Time: 09/08/15  3:10 AM  Result Value Ref Range   Sodium 136 135 - 145 mmol/L   Potassium 2.8 (L) 3.5 - 5.1 mmol/L   Chloride 103 101 - 111 mmol/L   CO2 24 22 - 32 mmol/L   Glucose, Bld 109 (H) 65 - 99 mg/dL   BUN 10 6 - 20 mg/dL   Creatinine, Ser 1.61 0.61 - 1.24 mg/dL   Calcium 8.7 (L) 8.9 - 10.3 mg/dL   GFR calc non Af Amer >60 >60 mL/min   GFR calc Af Amer >60 >60 mL/min   Anion gap 9 5 - 15  Synovial cell count + diff, w/ crystals     Status: Abnormal   Collection Time: 09/08/15  3:27 AM  Result Value Ref Range   Color, Synovial YELLOW YELLOW   Appearance-Synovial CLOUDY (A) CLEAR   Crystals, Fluid EXTRACELLULAR MONOSODIUM URATE CRYSTALS    WBC, Synovial 09604 (H) 0 - 200 /cu mm   Neutrophil, Synovial 93 (H) 0 - 25 %   Lymphocytes-Synovial Fld 2 0 - 20 %   Monocyte-Macrophage-Synovial Fluid 5 (L) 50 - 90 %   Eosinophils-Synovial 0 0 - 1 %  Body fluid culture     Status: None (Preliminary result)   Collection Time: 09/08/15  3:27 AM  Result Value Ref Range   Specimen Description SYNOVIAL LEFT KNEE     Special Requests NONE    Gram Stain      CYTOSPIN WBC PRESENT,BOTH PMN AND MONONUCLEAR NO ORGANISMS SEEN Gram Stain Report Called to,Read Back By and Verified With: L, ADKINS RN AT [phone] ON 06.06.17 BY SHUEA    Culture PENDING    Report Status PENDING    4:38 AM Synovial fluid findings consistent with acute gouty arthritis. No evidence of a septic joint at this time based on lab work.  Final diagnoses:  Acute idiopathic gout of left knee   I personally performed the services described in this documentation, which was scribed in my presence. The recorded information has been reviewed and is accurate.     Paula Libra, MD 09/08/15 [phone]

## 2015-09-08 NOTE — Discharge Instructions (Signed)

## 2015-09-11 LAB — BODY FLUID CULTURE: Culture: NO GROWTH

## 2017-04-25 ENCOUNTER — Emergency Department (HOSPITAL_COMMUNITY): Payer: Self-pay

## 2017-04-25 ENCOUNTER — Emergency Department (HOSPITAL_COMMUNITY)
Admission: EM | Admit: 2017-04-25 | Discharge: 2017-04-26 | Disposition: A | Discharge status: Home / Self Care | Payer: Self-pay | Attending: Emergency Medicine | Admitting: Emergency Medicine

## 2017-04-25 ENCOUNTER — Encounter (HOSPITAL_COMMUNITY): Payer: Self-pay | Admitting: Emergency Medicine

## 2017-04-25 DIAGNOSIS — R509 Fever, unspecified: Secondary | ICD-10-CM | POA: Insufficient documentation

## 2017-04-25 DIAGNOSIS — R2242 Localized swelling, mass and lump, left lower limb: Secondary | ICD-10-CM | POA: Insufficient documentation

## 2017-04-25 DIAGNOSIS — Z91040 Latex allergy status: Secondary | ICD-10-CM | POA: Insufficient documentation

## 2017-04-25 DIAGNOSIS — Z79899 Other long term (current) drug therapy: Secondary | ICD-10-CM | POA: Insufficient documentation

## 2017-04-25 DIAGNOSIS — Z7982 Long term (current) use of aspirin: Secondary | ICD-10-CM | POA: Insufficient documentation

## 2017-04-25 DIAGNOSIS — I10 Essential (primary) hypertension: Secondary | ICD-10-CM | POA: Insufficient documentation

## 2017-04-25 DIAGNOSIS — Z87891 Personal history of nicotine dependence: Secondary | ICD-10-CM | POA: Insufficient documentation

## 2017-04-25 DIAGNOSIS — R079 Chest pain, unspecified: Secondary | ICD-10-CM | POA: Insufficient documentation

## 2017-04-25 DIAGNOSIS — M109 Gout, unspecified: Secondary | ICD-10-CM | POA: Insufficient documentation

## 2017-04-25 LAB — I-STAT TROPONIN, ED: Troponin i, poc: 0.03 ng/mL (ref 0.00–0.08)

## 2017-04-25 LAB — CBC WITH DIFFERENTIAL/PLATELET
Basophils Absolute: 0 10*3/uL (ref 0.0–0.1)
Basophils Relative: 0 %
EOS ABS: 0 10*3/uL (ref 0.0–0.7)
EOS PCT: 0 %
HEMATOCRIT: 40.8 % (ref 39.0–52.0)
Hemoglobin: 14.1 g/dL (ref 13.0–17.0)
LYMPHS ABS: 1.4 10*3/uL (ref 0.7–4.0)
LYMPHS PCT: 13 %
MCH: 24.5 pg — ABNORMAL LOW (ref 26.0–34.0)
MCHC: 34.6 g/dL (ref 30.0–36.0)
MCV: 70.8 fL — AB (ref 78.0–100.0)
MONO ABS: 1.3 10*3/uL — AB (ref 0.1–1.0)
Monocytes Relative: 12 %
Neutro Abs: 7.8 10*3/uL — ABNORMAL HIGH (ref 1.7–7.7)
Neutrophils Relative %: 75 %
PLATELETS: 131 10*3/uL — AB (ref 150–400)
RBC: 5.76 MIL/uL (ref 4.22–5.81)
RDW: 13.9 % (ref 11.5–15.5)
WBC: 10.4 10*3/uL (ref 4.0–10.5)

## 2017-04-25 LAB — URINALYSIS, ROUTINE W REFLEX MICROSCOPIC
Bilirubin Urine: NEGATIVE
Glucose, UA: NEGATIVE mg/dL
Hgb urine dipstick: NEGATIVE
KETONES UR: NEGATIVE mg/dL
Leukocytes, UA: NEGATIVE
NITRITE: NEGATIVE
Specific Gravity, Urine: 1.023 (ref 1.005–1.030)
pH: 5 (ref 5.0–8.0)

## 2017-04-25 LAB — BASIC METABOLIC PANEL
ANION GAP: 8 (ref 5–15)
BUN: 15 mg/dL (ref 6–20)
CALCIUM: 9.1 mg/dL (ref 8.9–10.3)
CO2: 24 mmol/L (ref 22–32)
Chloride: 105 mmol/L (ref 101–111)
Creatinine, Ser: 1.02 mg/dL (ref 0.61–1.24)
GLUCOSE: 110 mg/dL — AB (ref 65–99)
Potassium: 3.3 mmol/L — ABNORMAL LOW (ref 3.5–5.1)
Sodium: 137 mmol/L (ref 135–145)

## 2017-04-25 LAB — CBG MONITORING, ED: Glucose-Capillary: 116 mg/dL — ABNORMAL HIGH (ref 65–99)

## 2017-04-25 MED ORDER — LIDOCAINE HCL (PF) 1 % IJ SOLN
10.0000 mL | Freq: Once | INTRAMUSCULAR | Status: AC
  Administered 2017-04-25: 10 mL via INTRADERMAL
  Filled 2017-04-25: qty 10

## 2017-04-25 MED ORDER — MORPHINE SULFATE (PF) 4 MG/ML IV SOLN
4.0000 mg | Freq: Once | INTRAVENOUS | Status: AC
  Administered 2017-04-25: 4 mg via INTRAVENOUS
  Filled 2017-04-25: qty 1

## 2017-04-25 MED ORDER — ACETAMINOPHEN 500 MG PO TABS
1000.0000 mg | ORAL_TABLET | Freq: Once | ORAL | Status: AC
  Administered 2017-04-25: 1000 mg via ORAL
  Filled 2017-04-25: qty 2

## 2017-04-25 MED ORDER — AMLODIPINE BESYLATE 5 MG PO TABS: 5.0000 mg | ORAL_TABLET | Freq: Once | ORAL | Status: DC

## 2017-04-25 MED ORDER — AMLODIPINE BESYLATE 5 MG PO TABS
10.0000 mg | ORAL_TABLET | Freq: Once | ORAL | Status: AC
Start: 2017-04-25 — End: 2017-04-25
  Administered 2017-04-25: 10 mg via ORAL
  Filled 2017-04-25: qty 2

## 2017-04-25 NOTE — ED Notes (Signed)
Bed: WA06 Expected date:  Expected time:  Means of arrival:  Comments: triage 

## 2017-04-25 NOTE — ED Notes (Signed)
Unable to collect labs at this time patient is going to xray 

## 2017-04-25 NOTE — ED Triage Notes (Signed)
Pt comes from home via EMS with complaints of gout pain in his left leg since Saturday and right sided chest pain since about 2 hours ago. A&O x4.  Vitals BP 196/114, HR 96 in route. CBG 107.

## 2017-04-25 NOTE — ED Notes (Signed)
Patient transported to [facility] 

## 2017-04-25 NOTE — ED Provider Notes (Signed)
Assumed care from PA Layden at shift change.  See prior notes for full H&P.  Briefly, 57 y.o. M here with left knee pain, redness, swelling.  Began having chest pain here, also noted to be hypertensive.  Work up thus far reassuring.  EKG with non-specific t-wave changes.  Joint fluid analysis has been performed as patient with fever here up to 101F.  Plan:  Delta trop, joint fluid analysis follow-up.  If reassuring, can be discharged home with treatment for gout.  Results for orders placed or performed during the hospital encounter of 04/25/17  Body fluid culture  Result Value Ref Range   Specimen Description KNEE LEFT    Special Requests Normal    Gram Stain      WBC PRESENT,BOTH PMN AND MONONUCLEAR NO ORGANISMS SEEN CYTOSPIN SMEAR Gram Stain Report Called to,Read Back By and Verified With: P DOWE RN [id number] 04/26/17 A NAVARRO    Culture PENDING    Report Status PENDING   Basic metabolic panel  Result Value Ref Range   Sodium 137 135 - 145 mmol/L   Potassium 3.3 (L) 3.5 - 5.1 mmol/L   Chloride 105 101 - 111 mmol/L   CO2 24 22 - 32 mmol/L   Glucose, Bld 110 (H) 65 - 99 mg/dL   BUN 15 6 - 20 mg/dL   Creatinine, Ser 1.61 0.61 - 1.24 mg/dL   Calcium 9.1 8.9 - 09.6 mg/dL   GFR calc non Af Amer >60 >60 mL/min   GFR calc Af Amer >60 >60 mL/min   Anion gap 8 5 - 15  CBC with Differential  Result Value Ref Range   WBC 10.4 4.0 - 10.5 K/uL   RBC 5.76 4.22 - 5.81 MIL/uL   Hemoglobin 14.1 13.0 - 17.0 g/dL   HCT 04.5 40.9 - 81.1 %   MCV 70.8 (L) 78.0 - 100.0 fL   MCH 24.5 (L) 26.0 - 34.0 pg   MCHC 34.6 30.0 - 36.0 g/dL   RDW 91.4 78.2 - 95.6 %   Platelets 131 (L) 150 - 400 K/uL   Neutrophils Relative % 75 %   Neutro Abs 7.8 (H) 1.7 - 7.7 K/uL   Lymphocytes Relative 13 %   Lymphs Abs 1.4 0.7 - 4.0 K/uL   Monocytes Relative 12 %   Monocytes Absolute 1.3 (H) 0.1 - 1.0 K/uL   Eosinophils Relative 0 %   Eosinophils Absolute 0.0 0.0 - 0.7 K/uL   Basophils Relative 0 %   Basophils  Absolute 0.0 0.0 - 0.1 K/uL   Smear Review MORPHOLOGY UNREMARKABLE   Cell count + diff,  w/ cryst-synvl fld  Result Value Ref Range   Color, Synovial YELLOW YELLOW   Appearance-Synovial TURBID (A) CLEAR   Crystals, Fluid INTRACELLULAR MONOSODIUM URATE CRYSTALS    WBC, Synovial 40,111 (H) 0 - 200 /cu mm   Neutrophil, Synovial 84 (H) 0 - 25 %   Lymphocytes-Synovial Fld 6 0 - 20 %   Monocyte-Macrophage-Synovial Fluid 10 (L) 50 - 90 %  Urinalysis, Routine w reflex microscopic  Result Value Ref Range   Color, Urine YELLOW YELLOW   APPearance CLEAR CLEAR   Specific Gravity, Urine 1.023 1.005 - 1.030   pH 5.0 5.0 - 8.0   Glucose, UA NEGATIVE NEGATIVE mg/dL   Hgb urine dipstick NEGATIVE NEGATIVE   Bilirubin Urine NEGATIVE NEGATIVE   Ketones, ur NEGATIVE NEGATIVE mg/dL   Protein, ur >=213 (A) NEGATIVE mg/dL   Nitrite NEGATIVE NEGATIVE   Leukocytes, UA NEGATIVE  NEGATIVE   RBC / HPF 0-5 0 - 5 RBC/hpf   WBC, UA 0-5 0 - 5 WBC/hpf   Bacteria, UA RARE (A) NONE SEEN   Squamous Epithelial / LPF 0-5 (A) NONE SEEN   Mucus PRESENT    Hyaline Casts, UA PRESENT   I-stat troponin, ED  Result Value Ref Range   Troponin i, poc 0.03 0.00 - 0.08 ng/mL   Comment 3          CBG monitoring, ED  Result Value Ref Range   Glucose-Capillary 116 (H) 65 - 99 mg/dL  I-stat troponin, ED  Result Value Ref Range   Troponin i, poc 0.03 0.00 - 0.08 ng/mL   Comment 3           Dg Chest 2 View  Result Date: 04/25/2017 CLINICAL DATA:  Chest pain EXAM: CHEST  2 VIEW COMPARISON:  07/20/2015 FINDINGS: The heart size and mediastinal contours are within normal limits. Both lungs are clear. The visualized skeletal structures are unremarkable. IMPRESSION: No active cardiopulmonary disease. Electronically Signed   By: Signa Kellaylor  Stroud M.D.   On: 04/25/2017 19:45   Dg Knee Complete 4 Views Left  Result Date: 04/25/2017 CLINICAL DATA:  Left knee pain x2 days.  History of gout EXAM: LEFT KNEE - COMPLETE 4+ VIEW COMPARISON:   03/18/2014. FINDINGS: Large suprapatellar joint effusion. Stable mild femorotibial joint space narrowing with spurring off the lateral tibial spine. No fracture. No intra-articular loose bodies nor erosive change. IMPRESSION: Large suprapatellar joint effusion similar to 2015. Chronic mild joint space narrowing of the femorotibial compartment with spurring of the lateral tibial spine. No acute osseous abnormality. Electronically Signed   By: Tollie Ethavid  Kwon M.D.   On: 04/25/2017 20:46    2:18 AM Delta troponin remains negative.  Patient BP has decreased nicely with oral medications.  Remains without neurologic deficits.  Synovial fluid with < 50,000 WBC without organism, urate crystals noted.  Presentation most consistent with acute gout flare.  Lower suspicion for septic joint.  Will treat as gout flare.  Will have him follow-up with PCP for this as well as recheck of BP.  Discussed plan with patient, he acknowledged understanding and agreed with plan of care.  Return precautions given for new or worsening symptoms.   Garlon HatchetSanders, Baley Lorimer M, PA-C 04/26/17 0319    Garlon HatchetSanders, Colbe Viviano M, PA-C 04/26/17 [place] [id number]    Long, Joshua G, MD 04/26/17 1017
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
## 2017-04-26 LAB — SYNOVIAL CELL COUNT + DIFF, W/ CRYSTALS
LYMPHOCYTES-SYNOVIAL FLD: 6 % (ref 0–20)
Monocyte-Macrophage-Synovial Fluid: 10 % — ABNORMAL LOW (ref 50–90)
NEUTROPHIL, SYNOVIAL: 84 % — AB (ref 0–25)
WBC, SYNOVIAL: 40111 /mm3 — AB (ref 0–200)

## 2017-04-26 LAB — I-STAT TROPONIN, ED: Troponin i, poc: 0.03 ng/mL (ref 0.00–0.08)

## 2017-04-26 MED ORDER — PREDNISONE 20 MG PO TABS: ORAL_TABLET | ORAL | 0 refills | Status: DC

## 2017-04-26 MED ORDER — COLCHICINE 0.6 MG PO TABS: 0.6000 mg | ORAL_TABLET | Freq: Every day | ORAL | 0 refills | Status: DC

## 2017-04-26 MED ORDER — HYDROCODONE-ACETAMINOPHEN 5-325 MG PO TABS: 1.0000 | ORAL_TABLET | ORAL | 0 refills | Status: DC | PRN

## 2017-04-26 NOTE — Discharge Instructions (Addendum)
Your fluid analysis did not show any signs of infection, it showed crystals consistent with your gout.  Take Colchicine as directed.  Make sure you are taking your blood pressure medication. Follow-up with your doctor regarding reevaluation of your blood pressure.   Return to the Emergency Department for any worsening chest pain, difficulty breathing, increased swelling of the leg of any other worsening or concerning symptoms.

## 2017-04-27 LAB — GLUCOSE, BODY FLUID OTHER: Glucose, Body Fluid Other: <2 mg/dL

## 2017-04-29 LAB — BODY FLUID CULTURE
Culture: NO GROWTH
Special Requests: NORMAL

## 2017-07-24 ENCOUNTER — Encounter (HOSPITAL_COMMUNITY): Payer: Self-pay | Admitting: Emergency Medicine

## 2017-07-24 ENCOUNTER — Emergency Department (HOSPITAL_COMMUNITY): Payer: Medicaid Other

## 2017-07-24 ENCOUNTER — Inpatient Hospital Stay (HOSPITAL_COMMUNITY)
Admission: EM | Admit: 2017-07-24 | Discharge: 2017-07-28 | DRG: 292 | Disposition: A | Discharge status: Home / Self Care | Payer: Medicaid Other | Attending: Internal Medicine | Admitting: Internal Medicine

## 2017-07-24 DIAGNOSIS — I160 Hypertensive urgency: Secondary | ICD-10-CM | POA: Diagnosis present

## 2017-07-24 DIAGNOSIS — Z87891 Personal history of nicotine dependence: Secondary | ICD-10-CM

## 2017-07-24 DIAGNOSIS — G4733 Obstructive sleep apnea (adult) (pediatric): Secondary | ICD-10-CM | POA: Diagnosis present

## 2017-07-24 DIAGNOSIS — Z7982 Long term (current) use of aspirin: Secondary | ICD-10-CM

## 2017-07-24 DIAGNOSIS — R0602 Shortness of breath: Secondary | ICD-10-CM

## 2017-07-24 DIAGNOSIS — Z79899 Other long term (current) drug therapy: Secondary | ICD-10-CM

## 2017-07-24 DIAGNOSIS — R748 Abnormal levels of other serum enzymes: Secondary | ICD-10-CM

## 2017-07-24 DIAGNOSIS — I350 Nonrheumatic aortic (valve) stenosis: Secondary | ICD-10-CM | POA: Diagnosis present

## 2017-07-24 DIAGNOSIS — I5031 Acute diastolic (congestive) heart failure: Secondary | ICD-10-CM | POA: Insufficient documentation

## 2017-07-24 DIAGNOSIS — I5023 Acute on chronic systolic (congestive) heart failure: Secondary | ICD-10-CM | POA: Insufficient documentation

## 2017-07-24 DIAGNOSIS — I509 Heart failure, unspecified: Secondary | ICD-10-CM

## 2017-07-24 DIAGNOSIS — I5032 Chronic diastolic (congestive) heart failure: Secondary | ICD-10-CM | POA: Insufficient documentation

## 2017-07-24 DIAGNOSIS — M109 Gout, unspecified: Secondary | ICD-10-CM | POA: Diagnosis present

## 2017-07-24 DIAGNOSIS — E876 Hypokalemia: Secondary | ICD-10-CM | POA: Diagnosis present

## 2017-07-24 DIAGNOSIS — K219 Gastro-esophageal reflux disease without esophagitis: Secondary | ICD-10-CM | POA: Diagnosis present

## 2017-07-24 DIAGNOSIS — E7800 Pure hypercholesterolemia, unspecified: Secondary | ICD-10-CM | POA: Diagnosis present

## 2017-07-24 DIAGNOSIS — I5033 Acute on chronic diastolic (congestive) heart failure: Secondary | ICD-10-CM | POA: Diagnosis present

## 2017-07-24 DIAGNOSIS — L03119 Cellulitis of unspecified part of limb: Secondary | ICD-10-CM | POA: Diagnosis present

## 2017-07-24 DIAGNOSIS — R079 Chest pain, unspecified: Secondary | ICD-10-CM | POA: Diagnosis present

## 2017-07-24 DIAGNOSIS — L03115 Cellulitis of right lower limb: Secondary | ICD-10-CM | POA: Diagnosis present

## 2017-07-24 DIAGNOSIS — I110 Hypertensive heart disease with heart failure: Principal | ICD-10-CM | POA: Diagnosis present

## 2017-07-24 HISTORY — DX: Unspecified osteoarthritis, unspecified site: M19.90

## 2017-07-24 HISTORY — DX: Dorsalgia, unspecified: M54.9

## 2017-07-24 HISTORY — DX: Headache: R51

## 2017-07-24 HISTORY — DX: Other chronic pain: G89.29

## 2017-07-24 HISTORY — DX: Headache, unspecified: R51.9

## 2017-07-24 HISTORY — DX: Personal history of other medical treatment: Z92.89

## 2017-07-24 HISTORY — DX: Heart failure, unspecified: I50.9

## 2017-07-24 LAB — BASIC METABOLIC PANEL
ANION GAP: 10 (ref 5–15)
BUN: 14 mg/dL (ref 6–20)
CALCIUM: 8.7 mg/dL — AB (ref 8.9–10.3)
CO2: 23 mmol/L (ref 22–32)
Chloride: 101 mmol/L (ref 101–111)
Creatinine, Ser: 1.16 mg/dL (ref 0.61–1.24)
Glucose, Bld: 127 mg/dL — ABNORMAL HIGH (ref 65–99)
Potassium: 3 mmol/L — ABNORMAL LOW (ref 3.5–5.1)
SODIUM: 134 mmol/L — AB (ref 135–145)

## 2017-07-24 LAB — I-STAT TROPONIN, ED: TROPONIN I, POC: 0.04 ng/mL (ref 0.00–0.08)

## 2017-07-24 LAB — CBC
HEMATOCRIT: 41.2 % (ref 39.0–52.0)
HEMOGLOBIN: 13.8 g/dL (ref 13.0–17.0)
MCH: 24 pg — ABNORMAL LOW (ref 26.0–34.0)
MCHC: 33.5 g/dL (ref 30.0–36.0)
MCV: 71.5 fL — ABNORMAL LOW (ref 78.0–100.0)
Platelets: 140 10*3/uL — ABNORMAL LOW (ref 150–400)
RBC: 5.76 MIL/uL (ref 4.22–5.81)
RDW: 13.3 % (ref 11.5–15.5)
WBC: 9.9 10*3/uL (ref 4.0–10.5)

## 2017-07-24 LAB — BRAIN NATRIURETIC PEPTIDE: B Natriuretic Peptide: 384.9 pg/mL — ABNORMAL HIGH (ref 0.0–100.0)

## 2017-07-24 LAB — TROPONIN I: TROPONIN I: 0.06 ng/mL — AB (ref <0.03)

## 2017-07-24 LAB — I-STAT CG4 LACTIC ACID, ED: Lactic Acid, Venous: 1.22 mmol/L (ref 0.5–1.9)

## 2017-07-24 MED ORDER — MORPHINE SULFATE (PF) 4 MG/ML IV SOLN
4.0000 mg | Freq: Once | INTRAVENOUS | Status: AC
  Administered 2017-07-24: 4 mg via INTRAVENOUS
  Filled 2017-07-24: qty 1

## 2017-07-24 MED ORDER — CEFAZOLIN SODIUM-DEXTROSE 2-4 GM/100ML-% IV SOLN
2.0000 g | Freq: Once | INTRAVENOUS | Status: AC
  Administered 2017-07-25: 2 g via INTRAVENOUS
  Filled 2017-07-24: qty 100

## 2017-07-24 MED ORDER — FUROSEMIDE 10 MG/ML IJ SOLN
40.0000 mg | Freq: Once | INTRAMUSCULAR | Status: AC
  Administered 2017-07-24: 40 mg via INTRAVENOUS
  Filled 2017-07-24: qty 4

## 2017-07-24 MED ORDER — LABETALOL HCL 5 MG/ML IV SOLN
15.0000 mg | Freq: Once | INTRAVENOUS | Status: AC
Start: 2017-07-24 — End: 2017-07-24
  Administered 2017-07-24: 15 mg via INTRAVENOUS
  Filled 2017-07-24: qty 4

## 2017-07-24 MED ORDER — POTASSIUM CHLORIDE CRYS ER 20 MEQ PO TBCR
40.0000 meq | EXTENDED_RELEASE_TABLET | Freq: Once | ORAL | Status: AC
Start: 2017-07-24 — End: 2017-07-24
  Administered 2017-07-24: 40 meq via ORAL
  Filled 2017-07-24: qty 2

## 2017-07-24 MED ORDER — TETANUS-DIPHTH-ACELL PERTUSSIS 5-2.5-18.5 LF-MCG/0.5 IM SUSP
0.5000 mL | Freq: Once | INTRAMUSCULAR | Status: AC
  Administered 2017-07-24: 0.5 mL via INTRAMUSCULAR
  Filled 2017-07-24: qty 0.5

## 2017-07-24 MED ORDER — NITROGLYCERIN 0.4 MG SL SUBL
0.4000 mg | SUBLINGUAL_TABLET | SUBLINGUAL | Status: DC | PRN
  Administered 2017-07-24: 0.4 mg via SUBLINGUAL
  Filled 2017-07-24: qty 1
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
## 2017-07-24 NOTE — ED Notes (Signed)
Patient transported to [facility] 

## 2017-07-24 NOTE — ED Notes (Signed)
ED Provider at bedside. 
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
## 2017-07-24 NOTE — ED Notes (Signed)
Pt refusing second nitro d/t headache.

## 2017-07-25 ENCOUNTER — Other Ambulatory Visit: Payer: Self-pay

## 2017-07-25 ENCOUNTER — Inpatient Hospital Stay (HOSPITAL_COMMUNITY): Payer: Self-pay

## 2017-07-25 ENCOUNTER — Inpatient Hospital Stay (HOSPITAL_COMMUNITY): Payer: Medicaid Other

## 2017-07-25 ENCOUNTER — Encounter (HOSPITAL_COMMUNITY): Payer: Self-pay | Admitting: General Practice

## 2017-07-25 DIAGNOSIS — I5033 Acute on chronic diastolic (congestive) heart failure: Secondary | ICD-10-CM | POA: Insufficient documentation

## 2017-07-25 DIAGNOSIS — I5031 Acute diastolic (congestive) heart failure: Secondary | ICD-10-CM | POA: Insufficient documentation

## 2017-07-25 DIAGNOSIS — Z87891 Personal history of nicotine dependence: Secondary | ICD-10-CM | POA: Diagnosis not present

## 2017-07-25 DIAGNOSIS — I110 Hypertensive heart disease with heart failure: Secondary | ICD-10-CM | POA: Diagnosis not present

## 2017-07-25 DIAGNOSIS — E876 Hypokalemia: Secondary | ICD-10-CM | POA: Diagnosis present

## 2017-07-25 DIAGNOSIS — L03119 Cellulitis of unspecified part of limb: Secondary | ICD-10-CM | POA: Diagnosis present

## 2017-07-25 DIAGNOSIS — M109 Gout, unspecified: Secondary | ICD-10-CM | POA: Diagnosis present

## 2017-07-25 DIAGNOSIS — I509 Heart failure, unspecified: Secondary | ICD-10-CM

## 2017-07-25 DIAGNOSIS — L03115 Cellulitis of right lower limb: Secondary | ICD-10-CM | POA: Diagnosis present

## 2017-07-25 DIAGNOSIS — Z7982 Long term (current) use of aspirin: Secondary | ICD-10-CM | POA: Diagnosis not present

## 2017-07-25 DIAGNOSIS — I361 Nonrheumatic tricuspid (valve) insufficiency: Secondary | ICD-10-CM

## 2017-07-25 DIAGNOSIS — G4733 Obstructive sleep apnea (adult) (pediatric): Secondary | ICD-10-CM | POA: Diagnosis present

## 2017-07-25 DIAGNOSIS — I160 Hypertensive urgency: Secondary | ICD-10-CM | POA: Diagnosis not present

## 2017-07-25 DIAGNOSIS — I350 Nonrheumatic aortic (valve) stenosis: Secondary | ICD-10-CM | POA: Diagnosis present

## 2017-07-25 DIAGNOSIS — K219 Gastro-esophageal reflux disease without esophagitis: Secondary | ICD-10-CM | POA: Diagnosis present

## 2017-07-25 DIAGNOSIS — I5023 Acute on chronic systolic (congestive) heart failure: Secondary | ICD-10-CM | POA: Insufficient documentation

## 2017-07-25 DIAGNOSIS — I5032 Chronic diastolic (congestive) heart failure: Secondary | ICD-10-CM | POA: Insufficient documentation

## 2017-07-25 DIAGNOSIS — Z79899 Other long term (current) drug therapy: Secondary | ICD-10-CM | POA: Diagnosis not present

## 2017-07-25 DIAGNOSIS — E7800 Pure hypercholesterolemia, unspecified: Secondary | ICD-10-CM | POA: Diagnosis present

## 2017-07-25 HISTORY — DX: Heart failure, unspecified: I50.9

## 2017-07-25 LAB — CBC WITH DIFFERENTIAL/PLATELET
BASOS ABS: 0 10*3/uL (ref 0.0–0.1)
Basophils Relative: 0 %
EOS PCT: 1 %
Eosinophils Absolute: 0.1 10*3/uL (ref 0.0–0.7)
HCT: 41.3 % (ref 39.0–52.0)
Hemoglobin: 13.9 g/dL (ref 13.0–17.0)
Lymphocytes Relative: 33 %
Lymphs Abs: 3.2 10*3/uL (ref 0.7–4.0)
MCH: 24 pg — ABNORMAL LOW (ref 26.0–34.0)
MCHC: 33.7 g/dL (ref 30.0–36.0)
MCV: 71.3 fL — AB (ref 78.0–100.0)
MONO ABS: 1 10*3/uL (ref 0.1–1.0)
Monocytes Relative: 10 %
NEUTROS PCT: 56 %
Neutro Abs: 5.4 10*3/uL (ref 1.7–7.7)
Platelets: 130 10*3/uL — ABNORMAL LOW (ref 150–400)
RBC: 5.79 MIL/uL (ref 4.22–5.81)
RDW: 13.4 % (ref 11.5–15.5)
WBC: 9.7 10*3/uL (ref 4.0–10.5)

## 2017-07-25 LAB — D-DIMER, QUANTITATIVE (NOT AT ARMC): D DIMER QUANT: 1.18 ug{FEU}/mL — AB (ref 0.00–0.50)

## 2017-07-25 LAB — TSH: TSH: 3.638 u[IU]/mL (ref 0.350–4.500)

## 2017-07-25 LAB — BASIC METABOLIC PANEL
ANION GAP: 12 (ref 5–15)
BUN: 13 mg/dL (ref 6–20)
CHLORIDE: 100 mmol/L — AB (ref 101–111)
CO2: 24 mmol/L (ref 22–32)
Calcium: 8.7 mg/dL — ABNORMAL LOW (ref 8.9–10.3)
Creatinine, Ser: 1.24 mg/dL (ref 0.61–1.24)
GFR calc Af Amer: >60 mL/min (ref >60)
GFR calc non Af Amer: >60 mL/min (ref >60)
GLUCOSE: 98 mg/dL (ref 65–99)
POTASSIUM: 3.4 mmol/L — AB (ref 3.5–5.1)
Sodium: 136 mmol/L (ref 135–145)

## 2017-07-25 LAB — MAGNESIUM: Magnesium: 1.4 mg/dL — ABNORMAL LOW (ref 1.7–2.4)

## 2017-07-25 LAB — ECHOCARDIOGRAM COMPLETE
Height: 69 in
WEIGHTICAEL: 3424 [oz_av]

## 2017-07-25 LAB — HIV ANTIBODY (ROUTINE TESTING W REFLEX): HIV Screen 4th Generation wRfx: NONREACTIVE

## 2017-07-25 LAB — TROPONIN I: TROPONIN I: 0.05 ng/mL — AB (ref <0.03)

## 2017-07-25 MED ORDER — CEFAZOLIN SODIUM-DEXTROSE 1-4 GM/50ML-% IV SOLN
1.0000 g | Freq: Three times a day (TID) | INTRAVENOUS | Status: DC
  Administered 2017-07-25: 1 g via INTRAVENOUS
  Filled 2017-07-25 (×2): qty 50

## 2017-07-25 MED ORDER — SODIUM CHLORIDE 0.9 % IV SOLN: 250.0000 mL | INTRAVENOUS | Status: DC | PRN

## 2017-07-25 MED ORDER — ENOXAPARIN SODIUM 100 MG/ML ~~LOC~~ SOLN
100.0000 mg | SUBCUTANEOUS | Status: AC
  Administered 2017-07-25: 100 mg via SUBCUTANEOUS
  Filled 2017-07-25: qty 1

## 2017-07-25 MED ORDER — PANTOPRAZOLE SODIUM 40 MG PO TBEC
40.0000 mg | DELAYED_RELEASE_TABLET | Freq: Every day | ORAL | Status: DC
  Administered 2017-07-25 – 2017-07-28 (×4): 40 mg via ORAL
  Filled 2017-07-25 (×4): qty 1

## 2017-07-25 MED ORDER — POTASSIUM CHLORIDE CRYS ER 20 MEQ PO TBCR
40.0000 meq | EXTENDED_RELEASE_TABLET | Freq: Once | ORAL | Status: AC
  Administered 2017-07-25: 40 meq via ORAL
  Filled 2017-07-25: qty 2

## 2017-07-25 MED ORDER — COLCHICINE 0.6 MG PO TABS: 0.6000 mg | ORAL_TABLET | Freq: Every day | ORAL | Status: DC | PRN

## 2017-07-25 MED ORDER — CLONIDINE HCL 0.2 MG PO TABS
0.2000 mg | ORAL_TABLET | Freq: Two times a day (BID) | ORAL | Status: DC
  Administered 2017-07-25 – 2017-07-28 (×8): 0.2 mg via ORAL
  Filled 2017-07-25 (×8): qty 1

## 2017-07-25 MED ORDER — FUROSEMIDE 10 MG/ML IJ SOLN
40.0000 mg | Freq: Two times a day (BID) | INTRAMUSCULAR | Status: DC
  Administered 2017-07-25 – 2017-07-26 (×4): 40 mg via INTRAVENOUS
  Filled 2017-07-25 (×3): qty 4

## 2017-07-25 MED ORDER — IOPAMIDOL (ISOVUE-370) INJECTION 76%
100.0000 mL | Freq: Once | INTRAVENOUS | Status: AC | PRN
  Administered 2017-07-25: 100 mL via INTRAVENOUS

## 2017-07-25 MED ORDER — POTASSIUM CHLORIDE CRYS ER 20 MEQ PO TBCR
20.0000 meq | EXTENDED_RELEASE_TABLET | ORAL | Status: AC
  Administered 2017-07-25: 20 meq via ORAL
  Filled 2017-07-25: qty 1

## 2017-07-25 MED ORDER — IOPAMIDOL (ISOVUE-370) INJECTION 76%
INTRAVENOUS | Status: AC
  Filled 2017-07-25: qty 100

## 2017-07-25 MED ORDER — ENOXAPARIN SODIUM 100 MG/ML ~~LOC~~ SOLN
100.0000 mg | Freq: Two times a day (BID) | SUBCUTANEOUS | Status: DC
  Administered 2017-07-25 – 2017-07-26 (×2): 100 mg via SUBCUTANEOUS
  Filled 2017-07-25 (×3): qty 1

## 2017-07-25 MED ORDER — ACETAMINOPHEN 325 MG PO TABS
650.0000 mg | ORAL_TABLET | ORAL | Status: DC | PRN
  Administered 2017-07-25 (×2): 650 mg via ORAL
  Filled 2017-07-25: qty 2

## 2017-07-25 MED ORDER — MORPHINE SULFATE (PF) 4 MG/ML IV SOLN: 2.0000 mg | INTRAVENOUS | Status: DC | PRN

## 2017-07-25 MED ORDER — LISINOPRIL 20 MG PO TABS
20.0000 mg | ORAL_TABLET | Freq: Every day | ORAL | Status: DC
  Administered 2017-07-25 – 2017-07-27 (×3): 20 mg via ORAL
  Filled 2017-07-25 (×4): qty 1

## 2017-07-25 MED ORDER — GUAIFENESIN-DM 100-10 MG/5ML PO SYRP
5.0000 mL | ORAL_SOLUTION | ORAL | Status: DC | PRN
  Administered 2017-07-25 – 2017-07-26 (×3): 5 mL via ORAL
  Filled 2017-07-25 (×3): qty 5

## 2017-07-25 MED ORDER — SODIUM CHLORIDE 0.9% FLUSH
3.0000 mL | INTRAVENOUS | Status: DC | PRN
  Administered 2017-07-27: 3 mL via INTRAVENOUS
  Filled 2017-07-25: qty 3

## 2017-07-25 MED ORDER — AMLODIPINE BESYLATE 10 MG PO TABS
10.0000 mg | ORAL_TABLET | Freq: Every day | ORAL | Status: DC
  Administered 2017-07-25 – 2017-07-28 (×4): 10 mg via ORAL
  Filled 2017-07-25 (×4): qty 1

## 2017-07-25 MED ORDER — SODIUM CHLORIDE 0.9% FLUSH
3.0000 mL | Freq: Two times a day (BID) | INTRAVENOUS | Status: DC
  Administered 2017-07-25 – 2017-07-27 (×6): 3 mL via INTRAVENOUS

## 2017-07-25 MED ORDER — HYDRALAZINE HCL 20 MG/ML IJ SOLN
10.0000 mg | INTRAMUSCULAR | Status: DC | PRN
  Administered 2017-07-27: 10 mg via INTRAVENOUS
  Filled 2017-07-25 (×2): qty 1

## 2017-07-25 MED ORDER — ASPIRIN 81 MG PO CHEW
81.0000 mg | CHEWABLE_TABLET | Freq: Every day | ORAL | Status: DC
  Administered 2017-07-25 – 2017-07-28 (×4): 81 mg via ORAL
  Filled 2017-07-25 (×4): qty 1

## 2017-07-25 MED ORDER — MAGNESIUM SULFATE 2 GM/50ML IV SOLN
2.0000 g | Freq: Once | INTRAVENOUS | Status: AC
  Administered 2017-07-25: 2 g via INTRAVENOUS
  Filled 2017-07-25: qty 50

## 2017-07-25 MED ORDER — ENOXAPARIN SODIUM 40 MG/0.4ML ~~LOC~~ SOLN: 40.0000 mg | SUBCUTANEOUS | Status: DC

## 2017-07-25 NOTE — Progress Notes (Signed)
PROGRESS NOTE    [name]  GNF:[id number] DOB: 23-Aug-1960 DOA: 07/24/2017 PCP: Massie MaroonHollis, Lachina M, FNP     Brief Narrative:  [name] is a 57 y.o. male with medical history significant of HTN, HLD, aortic stenosis, and gout who presents with complaints of chest pain.  Symptoms initially started approximately 5 days ago, when 4 tables fell onto his right leg while he was at work.  Since that time he been less mobile.  Patient reports developing a "weird cough" that he gets when he tries to lay down.  He does not notice the cough as much when sitting up.  Patient's significant other notes that he has had increased warmth and that his left leg is swollen twice the size of the right leg.  He has been limping for the last 2 days.  Since that time he is pain also complaining of dull central chest pressure that radiated to his neck and down his legs. In route with EMS patient complained of 8 out of 10 chest pressure radiating to his neck and reportedly was given 324 mg of aspirin, nitroglycerin, and 4 mg of Zofran for nausea symptoms.   He was admitted for further workup and evaluation of chest pain, suspected CHF.  Assessment & Plan:   Principal Problem:   CHF (congestive heart failure) (HCC) Active Problems:   Chest pain   Hypertensive urgency   Hypokalemia   Cellulitis of right leg  Suspected CHF exacerbation -Echocardiogram pending -Continue Lasix 40mg  IV BID  -Strict I/Os, daily weight   Chest pain -D dimer elevated, CTA chest negative for PE  -Troponin 0.04 --> 0.06 --> 0.05  -EKG reviewed independently, normal sinus rhythm with T wave inversion in lateral leads, similar in appearance to previous EKG in 2017 and 2019  -Echocardiogram pending  Hypertensive urgency -BP 185/118 on admission, now improved  -Continue amlodipine, clonidine, lisinopril  -Holding HCTZ as he has been started on lasix    Doubt cellulitis -Patient has scrapes of right shin due to trauma sustained when  table fell on his leg. His leg does not appear erythematous, he has no other signs or symptoms of infection -Stop cefazolin and monitor  Hx gout -Continue colchicine  Hypokalemia -Replace, trend  Hypomagnesemia -Replace, trend   GERD -Continue protonix    DVT prophylaxis: Lovenox Code Status: Full Family Communication: At bedside Disposition Plan: Pending echocardiogram, clinical improvement    Consultants:   None  Procedures:   None  Antimicrobials:  Anti-infectives (From admission, onward)   Start     Dose/Rate Route Frequency Ordered Stop   07/25/17 0600  ceFAZolin (ANCEF) IVPB 1 g/50 mL premix  Status:  Discontinued     1 g 100 mL/hr over 30 Minutes Intravenous Every 8 hours 07/25/17 0218 07/25/17 0936   07/24/17 2215  ceFAZolin (ANCEF) IVPB 2g/100 mL premix     2 g 200 mL/hr over 30 Minutes Intravenous  Once 07/24/17 2206 07/25/17 0031       Subjective: Patient admits to worsening shortness of breath with exertion.  He admits to dull central chest pain that has now resolved.  This has been ongoing since last Thursday.  He cannot pinpoint any exacerbating or alleviating factors.  He is also been having some cough.  Admits to some nausea without vomiting.  No abdominal pain.  Objective: Vitals:   07/25/17 0945 07/25/17 1000 07/25/17 1100 07/25/17 1130  BP:  (!) 152/99 (!) 143/108   Pulse: 73 71 92 70  Resp:  19 (!) 30  (!) 25  Temp:      TempSrc:      SpO2: 100% 96% 100% 96%  Weight:      Height:        Intake/Output Summary (Last 24 hours) at 07/25/2017 1310 Last data filed at 07/25/2017 1133 Gross per 24 hour  Intake 203 ml  Output 1710 ml  Net -1507 ml   Filed Weights   07/24/17 1909  Weight: 97.1 kg (214 lb)    Examination:  General exam: Appears calm and comfortable  Respiratory system: Clear to auscultation. Respiratory effort normal. Cardiovascular system: S1 & S2 heard, RRR. No JVD, murmurs, rubs, gallops or clicks. +1 pedal  edema. Gastrointestinal system: Abdomen is nondistended, soft and nontender. No organomegaly or masses felt. Normal bowel sounds heard. Central nervous system: Alert and oriented. No focal neurological deficits. Extremities: Symmetric 5 x 5 power. Skin: +Scrape of right shin, without drainage or erythema  Psychiatry: Judgement and insight appear normal. Mood & affect appropriate.   Data Reviewed: I have personally reviewed following labs and imaging studies  CBC: Recent Labs  Lab 07/24/17 1921 07/25/17 0209  WBC 9.9 9.7  NEUTROABS  --  5.4  HGB 13.8 13.9  HCT 41.2 41.3  MCV 71.5* 71.3*  PLT 140* 130*   Basic Metabolic Panel: Recent Labs  Lab 07/24/17 1921 07/25/17 0044 07/25/17 0209  NA 134*  --  136  K 3.0*  --  3.4*  CL 101  --  100*  CO2 23  --  24  GLUCOSE 127*  --  98  BUN 14  --  13  CREATININE 1.16  --  1.24  CALCIUM 8.7*  --  8.7*  MG  --  1.4*  --    GFR: Estimated Creatinine Clearance: 75.6 mL/min (by C-G formula based on SCr of 1.24 mg/dL). Liver Function Tests: No results for input(s): AST, ALT, ALKPHOS, BILITOT, PROT, ALBUMIN in the last 168 hours. No results for input(s): LIPASE, AMYLASE in the last 168 hours. No results for input(s): AMMONIA in the last 168 hours. Coagulation Profile: No results for input(s): INR, PROTIME in the last 168 hours. Cardiac Enzymes: Recent Labs  Lab 07/24/17 2004 07/25/17 0044  TROPONINI 0.06* 0.05*   BNP (last 3 results) No results for input(s): PROBNP in the last 8760 hours. HbA1C: No results for input(s): HGBA1C in the last 72 hours. CBG: No results for input(s): GLUCAP in the last 168 hours. Lipid Profile: No results for input(s): CHOL, HDL, LDLCALC, TRIG, CHOLHDL, LDLDIRECT in the last 72 hours. Thyroid Function Tests: Recent Labs    07/25/17 0101  TSH 3.638   Anemia Panel: No results for input(s): VITAMINB12, FOLATE, FERRITIN, TIBC, IRON, RETICCTPCT in the last 72 hours. Sepsis Labs: Recent Labs   Lab 07/24/17 1929  LATICACIDVEN 1.22    No results found for this or any previous visit (from the past 240 hour(s)).     Radiology Studies: Dg Chest 2 View  Result Date: 07/24/2017 CLINICAL DATA:  Chest pain. EXAM: CHEST - 2 VIEW COMPARISON:  Chest x-[place] dated April 25, 2017. FINDINGS: Stable mild cardiomegaly. Normal pulmonary vascularity. No focal consolidation, pleural effusion, or pneumothorax. No acute osseous abnormality. IMPRESSION: No active cardiopulmonary disease. Electronically Signed   By: Obie Dredge M.D.   On: 07/24/2017 20:29   Ct Angio Chest Pe W Or Wo Contrast  Result Date: 07/25/2017 CLINICAL DATA:  Acute onset of generalized chest pressure, radiating to the neck. Shortness of  breath and nausea. Bilateral lower extremity swelling. EXAM: CT ANGIOGRAPHY CHEST WITH CONTRAST TECHNIQUE: Multidetector CT imaging of the chest was performed using the standard protocol during bolus administration of intravenous contrast. Multiplanar CT image reconstructions and MIPs were obtained to evaluate the vascular anatomy. CONTRAST:  ISOVUE-370 IOPAMIDOL (ISOVUE-370) INJECTION 76% COMPARISON:  Chest radiograph performed 07/24/2017 FINDINGS: Cardiovascular:  There is no evidence of pulmonary embolus. The heart is borderline normal in size. Scattered coronary artery calcifications are seen. Mild calcification is noted along the aortic arch and proximal left subclavian artery. Calcification is also noted at the aortic valve. Mediastinum/Nodes: The mediastinum is otherwise unremarkable. No mediastinal lymphadenopathy is seen. No pericardial effusion is identified. The visualized portions of the thyroid gland are unremarkable. No axillary lymphadenopathy is appreciated. Lungs/Pleura: Minimal bilateral atelectasis is noted. The lungs are otherwise clear. No pleural effusion or pneumothorax is seen. No masses are identified. Upper Abdomen: The visualized portions of the liver and spleen are  unremarkable. There is reflux of contrast into the hepatic veins and IVC. The visualized portions of the pancreas, adrenal glands and kidneys are within normal limits. Musculoskeletal: No acute osseous abnormalities are identified. The visualized musculature is unremarkable in appearance. Review of the MIP images confirms the above findings. IMPRESSION: 1. No evidence of pulmonary embolus. 2. Minimal bilateral atelectasis noted.  Lungs otherwise clear. 3. Reflux of contrast noted into the hepatic veins and IVC. 4. Scattered coronary artery calcifications seen. Calcification at the aortic valve. Electronically Signed   By: Roanna Raider M.D.   On: 07/25/2017 05:34      Scheduled Meds: . amLODipine  10 mg Oral Daily  . aspirin  81 mg Oral Daily  . cloNIDine  0.2 mg Oral BID  . enoxaparin (LOVENOX) injection  100 mg Subcutaneous Q12H  . furosemide  40 mg Intravenous BID  . iopamidol      . lisinopril  20 mg Oral Daily  . pantoprazole  40 mg Oral Daily  . sodium chloride flush  3 mL Intravenous Q12H   Continuous Infusions: . sodium chloride       LOS: 0 days    Time spent: 35 minutes   Noralee Stain, DO [facility] [email or web address] Password [email or web address] 07/25/2017, 1:10 PM

## 2017-07-25 NOTE — Progress Notes (Signed)
ANTICOAGULATION CONSULT NOTE - Initial Consult  Pharmacy Consult for Lovenox Indication: r/o VTE  Allergies  Allergen Reactions  . Adhesive [Tape] Other (See Comments)    Makes the skin feel as if it is burning, will also bruise the skin.  . Latex Hives and Itching    Burns skin    Patient Measurements: Height: 5\' 9"  (175.3 cm) Weight: 214 lb (97.1 kg) IBW/kg (Calculated) : 70.7  Vital Signs: Temp: 99.8 F (37.7 C) (04/22 2300) Temp Source: Oral (04/22 2300) BP: 145/101 (04/23 0215) Pulse Rate: 76 (04/23 0215)  Labs: Recent Labs    07/24/17 1921 07/24/17 2004 07/25/17 0044 07/25/17 0209  HGB 13.8  --   --  13.9  HCT 41.2  --   --  41.3  PLT 140*  --   --  130*  CREATININE 1.16  --   --  1.24  TROPONINI  --  0.06* 0.05*  --     Estimated Creatinine Clearance: 75.6 mL/min (by C-G formula based on SCr of 1.24 mg/dL).   Medical History: Past Medical History:  Diagnosis Date  . Angina   . Aortic stenosis 2013   mild in 2013  . Exertional dyspnea   . GERD (gastroesophageal reflux disease)   . Gout    "sometimes flares up even w/Allupurinol"  . High cholesterol   . Hypertension   . Sleep apnea 08/2010   "not required to wear mask"     Assessment: 56yo male c/o CP associated w/ cough and swollen LLE, admitted for suspected CHF exacerbation and cellulitis but w/ elevated D-dimer cannot r/o VTE, to start LMWH.  Goal of Therapy:  Anti-Xa level 0.6-1 units/ml 4hrs after LMWH dose given Monitor platelets by anticoagulation protocol: Yes   Plan:  Lovenox 100mg  SQ Q12H and monitor CBC; f/u imaging.  Vernard GamblesVeronda Ravin Denardo, PharmD, BCPS  07/25/2017,4:16 AM

## 2017-07-25 NOTE — ED Notes (Signed)
Attempted report x 2 

## 2017-07-25 NOTE — ED Notes (Signed)
Called main lab. Added on D-dimer from previous blood collection.

## 2017-07-25 NOTE — Progress Notes (Signed)
  Echocardiogram 2D Echocardiogram has been performed.  Janalyn HarderWest, Karion Cudd R 07/25/2017, 11:57 AM

## 2017-07-25 NOTE — ED Notes (Signed)
Patient transported to CT 

## 2017-07-25 NOTE — ED Notes (Addendum)
Admitting MD Smith at bedside.  

## 2017-07-26 ENCOUNTER — Inpatient Hospital Stay (HOSPITAL_COMMUNITY): Payer: Medicaid Other

## 2017-07-26 DIAGNOSIS — I5031 Acute diastolic (congestive) heart failure: Secondary | ICD-10-CM

## 2017-07-26 DIAGNOSIS — R609 Edema, unspecified: Secondary | ICD-10-CM

## 2017-07-26 LAB — BASIC METABOLIC PANEL
ANION GAP: 10 (ref 5–15)
BUN: 18 mg/dL (ref 6–20)
CHLORIDE: 102 mmol/L (ref 101–111)
CO2: 25 mmol/L (ref 22–32)
Calcium: 8.8 mg/dL — ABNORMAL LOW (ref 8.9–10.3)
Creatinine, Ser: 1.27 mg/dL — ABNORMAL HIGH (ref 0.61–1.24)
GFR calc non Af Amer: >60 mL/min (ref >60)
Glucose, Bld: 115 mg/dL — ABNORMAL HIGH (ref 65–99)
Potassium: 3.5 mmol/L (ref 3.5–5.1)
SODIUM: 137 mmol/L (ref 135–145)

## 2017-07-26 LAB — CBC
HEMATOCRIT: 38.7 % — AB (ref 39.0–52.0)
Hemoglobin: 12.8 g/dL — ABNORMAL LOW (ref 13.0–17.0)
MCH: 23.5 pg — ABNORMAL LOW (ref 26.0–34.0)
MCHC: 33.1 g/dL (ref 30.0–36.0)
MCV: 71 fL — AB (ref 78.0–100.0)
Platelets: 129 10*3/uL — ABNORMAL LOW (ref 150–400)
RBC: 5.45 MIL/uL (ref 4.22–5.81)
RDW: 13.3 % (ref 11.5–15.5)
WBC: 5.4 10*3/uL (ref 4.0–10.5)

## 2017-07-26 LAB — MAGNESIUM: Magnesium: 1.9 mg/dL (ref 1.7–2.4)

## 2017-07-26 MED ORDER — ENOXAPARIN SODIUM 40 MG/0.4ML ~~LOC~~ SOLN
40.0000 mg | SUBCUTANEOUS | Status: DC
  Administered 2017-07-27: 40 mg via SUBCUTANEOUS
  Filled 2017-07-26: qty 0.4

## 2017-07-26 MED ORDER — POTASSIUM CHLORIDE CRYS ER 20 MEQ PO TBCR
40.0000 meq | EXTENDED_RELEASE_TABLET | Freq: Once | ORAL | Status: AC
  Administered 2017-07-26: 40 meq via ORAL
  Filled 2017-07-26: qty 2

## 2017-07-26 MED ORDER — ACETAMINOPHEN 325 MG PO TABS
650.0000 mg | ORAL_TABLET | ORAL | Status: DC | PRN
  Administered 2017-07-26: 650 mg via ORAL
  Filled 2017-07-26: qty 2

## 2017-07-26 NOTE — Progress Notes (Signed)
Bilateral lower extremity venous duplex completed. Preliminary results. Right - There is no evidence of a DVT, superficial thrombosis, or Baker's cyst. Left - There is no evidence of a DVT or superficial thrombosis. There is an area of cystic like structure with mixed echoes in the popliteal fossa consistent with a possible Baker's cyst. Toma DeitersVirginia Tonianne Fine, RVS 07/25/2017, 10:02

## 2017-07-26 NOTE — Plan of Care (Signed)
  Problem: Activity: Goal: Capacity to carry out activities will improve Outcome: Progressing   Problem: Education: Goal: Ability to verbalize understanding of medication therapies will improve Outcome: Progressing   Problem: Cardiac: Goal: Ability to achieve and maintain adequate cardiopulmonary perfusion will improve Outcome: Progressing

## 2017-07-26 NOTE — Progress Notes (Signed)
PROGRESS NOTE    [name]  ZOX:[id number] DOB: [birth year] DOA: 07/24/2017 PCP: Massie Maroon, FNP     Brief Narrative:  [name] is a 57 y.o. male with medical history significant of HTN, HLD, aortic stenosis, and gout who presents with complaints of chest pain.  Symptoms initially started approximately 5 days ago, when 4 tables fell onto his right leg while he was at work.  Since that time he been less mobile.  Patient reports developing a "weird cough" that he gets when he tries to lay down.  He does not notice the cough as much when sitting up.  Patient's significant other notes that he has had increased warmth and that his left leg is swollen twice the size of the right leg.  He has been limping for the last 2 days.  Since that time he is pain also complaining of dull central chest pressure that radiated to his neck and down his legs. In route with EMS patient complained of 8 out of 10 chest pressure radiating to his neck and reportedly was given 324 mg of aspirin, nitroglycerin, and 4 mg of Zofran for nausea symptoms.   He was admitted for further workup and evaluation of chest pain, suspected CHF.  Assessment & Plan:   Principal Problem:   CHF (congestive heart failure) (HCC) Active Problems:   Chest pain   Hypertensive urgency   Hypokalemia   Cellulitis of right leg  Acute diastolic CHF exacerbation -Echocardiogram showed severe LVH, EF 50-55%, restrictive physiology, no wall motion abnormalities  -Continue Lasix 40mg  IV BID  -Strict I/Os, daily weight  -Net -1.7L   Chest pain -D dimer elevated, CTA chest negative for PE  -Troponin 0.04 --> 0.06 --> 0.05  -EKG reviewed independently, normal sinus rhythm with T wave inversion in lateral leads, similar in appearance to previous EKG in 2017 and 2019  -Echocardiogram without wall motion abnormalities  Hypertensive urgency -BP 185/118 on admission, now improved  -Continue amlodipine, clonidine, lisinopril  -Holding HCTZ  as he has been started on lasix   -BP stable   Doubt cellulitis -Patient has scrapes of right shin due to trauma sustained when table fell on his leg. His leg does not appear erythematous, wound appears to be healing well without drainage -Stop cefazolin and monitor  Fever -CXR reviewed independently, without acute pulmonary process, +cardiomegaly. Await final radiology report.  -Obtain blood culture -Monitor off antibiotics   Hx gout -Continue colchicine  GERD -Continue protonix    DVT prophylaxis: Lovenox Code Status: Full Family Communication: At bedside Disposition Plan: Pending diuresis and clinical improvement, home 24-48 hours    Consultants:   None  Procedures:   None  Antimicrobials:  Anti-infectives (From admission, onward)   Start     Dose/Rate Route Frequency Ordered Stop   07/25/17 0600  ceFAZolin (ANCEF) IVPB 1 g/50 mL premix  Status:  Discontinued     1 g 100 mL/hr over 30 Minutes Intravenous Every 8 hours 07/25/17 0218 07/25/17 0936   07/24/17 2215  ceFAZolin (ANCEF) IVPB 2g/100 mL premix     2 g 200 mL/hr over 30 Minutes Intravenous  Once 07/24/17 2206 07/25/17 0031       Subjective: States he felt terrible this morning with fever, sweats, and soaked the bedsheets. Had some left shoulder pain as well. Currently, feeling much better. Denies chest pain.   Objective: Vitals:   07/26/17 0140 07/26/17 0430 07/26/17 0757 07/26/17 1000  BP: 137/89 (!) 135/98 (!) 160/116  Pulse: 68 76 78   Resp:   18   Temp: 99.1 F (37.3 C) 99.5 F (37.5 C) (!) 100.6 F (38.1 C) 97.7 F (36.5 C)  TempSrc: Oral Oral Oral Axillary  SpO2: 96% 98% 96%   Weight: 87.7 kg (193 lb 6.4 oz)     Height:        Intake/Output Summary (Last 24 hours) at 07/26/2017 1043 Last data filed at 07/26/2017 0900 Gross per 24 hour  Intake 480 ml  Output 1100 ml  Net -620 ml   Filed Weights   07/24/17 1909 07/26/17 0140  Weight: 97.1 kg (214 lb) 87.7 kg (193 lb 6.4 oz)     Examination: General exam: Appears calm and comfortable  Respiratory system: Clear to auscultation. Respiratory effort normal. Cardiovascular system: S1 & S2 heard, RRR. No JVD, murmurs, rubs, gallops or clicks. Trace pedal edema. Gastrointestinal system: Abdomen is nondistended, soft and nontender. No organomegaly or masses felt. Normal bowel sounds heard. Central nervous system: Alert and oriented. No focal neurological deficits. Extremities: Symmetric 5 x 5 power. Skin: +Scrapes right shin without erythema  Psychiatry: Judgement and insight appear normal. Mood & affect appropriate.    Data Reviewed: I have personally reviewed following labs and imaging studies  CBC: Recent Labs  Lab 07/24/17 1921 07/25/17 0209 07/26/17 0449  WBC 9.9 9.7 5.4  NEUTROABS  --  5.4  --   HGB 13.8 13.9 12.8*  HCT 41.2 41.3 38.7*  MCV 71.5* 71.3* 71.0*  PLT 140* 130* 129*   Basic Metabolic Panel: Recent Labs  Lab 07/24/17 1921 07/25/17 0044 07/25/17 0209 07/26/17 0449  NA 134*  --  136 137  K 3.0*  --  3.4* 3.5  CL 101  --  100* 102  CO2 23  --  24 25  GLUCOSE 127*  --  98 115*  BUN 14  --  13 18  CREATININE 1.16  --  1.24 1.27*  CALCIUM 8.7*  --  8.7* 8.8*  MG  --  1.4*  --  1.9   GFR: Estimated Creatinine Clearance: 70.3 mL/min (A) (by C-G formula based on SCr of 1.27 mg/dL (H)). Liver Function Tests: No results for input(s): AST, ALT, ALKPHOS, BILITOT, PROT, ALBUMIN in the last 168 hours. No results for input(s): LIPASE, AMYLASE in the last 168 hours. No results for input(s): AMMONIA in the last 168 hours. Coagulation Profile: No results for input(s): INR, PROTIME in the last 168 hours. Cardiac Enzymes: Recent Labs  Lab 07/24/17 2004 07/25/17 0044  TROPONINI 0.06* 0.05*   BNP (last 3 results) No results for input(s): PROBNP in the last 8760 hours. HbA1C: No results for input(s): HGBA1C in the last 72 hours. CBG: No results for input(s): GLUCAP in the last 168  hours. Lipid Profile: No results for input(s): CHOL, HDL, LDLCALC, TRIG, CHOLHDL, LDLDIRECT in the last 72 hours. Thyroid Function Tests: Recent Labs    07/25/17 0101  TSH 3.638   Anemia Panel: No results for input(s): VITAMINB12, FOLATE, FERRITIN, TIBC, IRON, RETICCTPCT in the last 72 hours. Sepsis Labs: Recent Labs  Lab 07/24/17 1929  LATICACIDVEN 1.22    No results found for this or any previous visit (from the past 240 hour(s)).     Radiology Studies: Dg Chest 2 View  Result Date: 07/24/2017 CLINICAL DATA:  Chest pain. EXAM: CHEST - 2 VIEW COMPARISON:  Chest x-[place] dated April 25, 2017. FINDINGS: Stable mild cardiomegaly. Normal pulmonary vascularity. No focal consolidation, pleural effusion, or pneumothorax. No  acute osseous abnormality. IMPRESSION: No active cardiopulmonary disease. Electronically Signed   By: Obie Dredge M.D.   On: 07/24/2017 20:29   Ct Angio Chest Pe W Or Wo Contrast  Result Date: 07/25/2017 CLINICAL DATA:  Acute onset of generalized chest pressure, radiating to the neck. Shortness of breath and nausea. Bilateral lower extremity swelling. EXAM: CT ANGIOGRAPHY CHEST WITH CONTRAST TECHNIQUE: Multidetector CT imaging of the chest was performed using the standard protocol during bolus administration of intravenous contrast. Multiplanar CT image reconstructions and MIPs were obtained to evaluate the vascular anatomy. CONTRAST:  ISOVUE-370 IOPAMIDOL (ISOVUE-370) INJECTION 76% COMPARISON:  Chest radiograph performed 07/24/2017 FINDINGS: Cardiovascular:  There is no evidence of pulmonary embolus. The heart is borderline normal in size. Scattered coronary artery calcifications are seen. Mild calcification is noted along the aortic arch and proximal left subclavian artery. Calcification is also noted at the aortic valve. Mediastinum/Nodes: The mediastinum is otherwise unremarkable. No mediastinal lymphadenopathy is seen. No pericardial effusion is identified.  The visualized portions of the thyroid gland are unremarkable. No axillary lymphadenopathy is appreciated. Lungs/Pleura: Minimal bilateral atelectasis is noted. The lungs are otherwise clear. No pleural effusion or pneumothorax is seen. No masses are identified. Upper Abdomen: The visualized portions of the liver and spleen are unremarkable. There is reflux of contrast into the hepatic veins and IVC. The visualized portions of the pancreas, adrenal glands and kidneys are within normal limits. Musculoskeletal: No acute osseous abnormalities are identified. The visualized musculature is unremarkable in appearance. Review of the MIP images confirms the above findings. IMPRESSION: 1. No evidence of pulmonary embolus. 2. Minimal bilateral atelectasis noted.  Lungs otherwise clear. 3. Reflux of contrast noted into the hepatic veins and IVC. 4. Scattered coronary artery calcifications seen. Calcification at the aortic valve. Electronically Signed   By: Roanna Raider M.D.   On: 07/25/2017 05:34      Scheduled Meds: . amLODipine  10 mg Oral Daily  . aspirin  81 mg Oral Daily  . cloNIDine  0.2 mg Oral BID  . enoxaparin (LOVENOX) injection  100 mg Subcutaneous Q12H  . furosemide  40 mg Intravenous BID  . lisinopril  20 mg Oral Daily  . pantoprazole  40 mg Oral Daily  . sodium chloride flush  3 mL Intravenous Q12H   Continuous Infusions: . sodium chloride       LOS: 1 day    Time spent: 25 minutes   Noralee Stain, DO [facility] [email or web address] Password [email or web address] 07/26/2017, 10:43 AM

## 2017-07-26 NOTE — Progress Notes (Signed)
Pt complaining of dizziness and sweaty, temp of 100.6, tylenol 650 mg given, MD page, continue to monitor for improvement with tylenol.

## 2017-07-27 ENCOUNTER — Encounter (HOSPITAL_COMMUNITY): Payer: Self-pay | Admitting: *Deleted

## 2017-07-27 LAB — BASIC METABOLIC PANEL
Anion gap: 10 (ref 5–15)
BUN: 21 mg/dL — AB (ref 6–20)
CHLORIDE: 102 mmol/L (ref 101–111)
CO2: 26 mmol/L (ref 22–32)
CREATININE: 1.27 mg/dL — AB (ref 0.61–1.24)
Calcium: 9.1 mg/dL (ref 8.9–10.3)
GFR calc Af Amer: >60 mL/min (ref >60)
GFR calc non Af Amer: >60 mL/min (ref >60)
Glucose, Bld: 111 mg/dL — ABNORMAL HIGH (ref 65–99)
POTASSIUM: 4 mmol/L (ref 3.5–5.1)
SODIUM: 138 mmol/L (ref 135–145)

## 2017-07-27 LAB — CBC
HEMATOCRIT: 40.2 % (ref 39.0–52.0)
Hemoglobin: 13.6 g/dL (ref 13.0–17.0)
MCH: 24 pg — ABNORMAL LOW (ref 26.0–34.0)
MCHC: 33.8 g/dL (ref 30.0–36.0)
MCV: 71 fL — AB (ref 78.0–100.0)
Platelets: 143 10*3/uL — ABNORMAL LOW (ref 150–400)
RBC: 5.66 MIL/uL (ref 4.22–5.81)
RDW: 13.9 % (ref 11.5–15.5)
WBC: 3.2 10*3/uL — AB (ref 4.0–10.5)

## 2017-07-27 LAB — MAGNESIUM: Magnesium: 1.7 mg/dL (ref 1.7–2.4)

## 2017-07-27 MED ORDER — CARVEDILOL 3.125 MG PO TABS
3.1250 mg | ORAL_TABLET | Freq: Two times a day (BID) | ORAL | Status: DC
  Administered 2017-07-27 (×2): 3.125 mg via ORAL
  Filled 2017-07-27 (×2): qty 1

## 2017-07-27 MED ORDER — BENZONATATE 100 MG PO CAPS
100.0000 mg | ORAL_CAPSULE | Freq: Three times a day (TID) | ORAL | Status: DC | PRN
  Administered 2017-07-27 (×2): 100 mg via ORAL
  Filled 2017-07-27 (×2): qty 1

## 2017-07-27 MED ORDER — CARVEDILOL 3.125 MG PO TABS
3.1250 mg | ORAL_TABLET | Freq: Two times a day (BID) | ORAL | Status: DC
  Administered 2017-07-28: 3.125 mg via ORAL
  Filled 2017-07-27: qty 1

## 2017-07-27 MED ORDER — FUROSEMIDE 10 MG/ML IJ SOLN
60.0000 mg | Freq: Two times a day (BID) | INTRAMUSCULAR | Status: DC
  Administered 2017-07-27 – 2017-07-28 (×3): 60 mg via INTRAVENOUS
  Filled 2017-07-27 (×3): qty 6

## 2017-07-27 MED ORDER — ALBUTEROL SULFATE (2.5 MG/3ML) 0.083% IN NEBU
2.5000 mg | INHALATION_SOLUTION | RESPIRATORY_TRACT | Status: DC | PRN
  Administered 2017-07-27: 2.5 mg via RESPIRATORY_TRACT
  Filled 2017-07-27: qty 3

## 2017-07-27 NOTE — Progress Notes (Signed)
PROGRESS NOTE    [name]  GNF:[id number] DOB: 10-03-1960 DOA: 07/24/2017 PCP: Massie MaroonHollis, Lachina M, FNP     Brief Narrative:  Shawn Leydenugene Langdon is a 57 y.o. male with medical history significant of HTN, HLD, aortic stenosis, and gout who presents with complaints of chest pain.  Symptoms initially started approximately 5 days ago, when 4 tables fell onto his right leg while he was at work.  Since that time he been less mobile.  Patient reports developing a "weird cough" that he gets when he tries to lay down.  He does not notice the cough as much when sitting up.  Patient's significant other notes that he has had increased warmth and that his left leg is swollen twice the size of the right leg.  He has been limping for the last 2 days.  Since that time he is pain also complaining of dull central chest pressure that radiated to his neck and down his legs. In route with EMS patient complained of 8 out of 10 chest pressure radiating to his neck and reportedly was given 324 mg of aspirin, nitroglycerin, and 4 mg of Zofran for nausea symptoms.   He was admitted for further workup and evaluation of chest pain, suspected CHF.  Assessment & Plan:   Principal Problem:   Acute diastolic CHF (congestive heart failure) (HCC) Active Problems:   Chest pain   Hypertensive urgency   Hypokalemia   Cellulitis of right leg  Acute diastolic CHF exacerbation -Echocardiogram showed severe LVH, EF 50-55%, restrictive physiology, no wall motion abnormalities  -Strict I/Os, daily weight  -Net -1.8L  -Increase Lasix 60mg  IV BID   Chest pain -D dimer elevated, CTA chest negative for PE  -Troponin 0.04 --> 0.06 --> 0.05  -EKG reviewed independently, normal sinus rhythm with T wave inversion in lateral leads, similar in appearance to previous EKG in 2017 and 2019  -Echocardiogram without wall motion abnormalities -Denies chest pain today   Hypertensive urgency -BP 185/118 on admission, now improved  -Continue  amlodipine, clonidine, lisinopril, coreg  -Holding HCTZ as he has been started on lasix   -BP stable today   Doubt cellulitis -Patient has scrapes of right shin due to trauma sustained when table fell on his leg. His leg does not appear erythematous, wound appears to be healing well without drainage -Stop cefazolin and monitor  Fever -CXR without active cardiopulmonary disease  -Blood culture pending -No further fevers overnight  -Monitor off antibiotics   Hx gout -Continue colchicine  GERD -Continue protonix  OSA -States he wakes up in the middle of the night gasping for air. He states he had a sleep study > 10 years ago and was diagnosed with sleep apnea but does not wear CPAP mask. Encouraged him to obtain new sleep study    DVT prophylaxis: Lovenox Code Status: Full Family Communication: Wife at bedside Disposition Plan: Pending diuresis and clinical improvement, home 24-48 hours    Consultants:   None  Procedures:   None  Antimicrobials:  Anti-infectives (From admission, onward)   Start     Dose/Rate Route Frequency Ordered Stop   07/25/17 0600  ceFAZolin (ANCEF) IVPB 1 g/50 mL premix  Status:  Discontinued     1 g 100 mL/hr over 30 Minutes Intravenous Every 8 hours 07/25/17 0218 07/25/17 0936   07/24/17 2215  ceFAZolin (ANCEF) IVPB 2g/100 mL premix     2 g 200 mL/hr over 30 Minutes Intravenous  Once 07/24/17 2206 07/25/17 0031  Subjective: No new complaints other than dry cough. Talked about ambulating in the hallway    Objective: Vitals:   07/26/17 1951 07/27/17 0024 07/27/17 0117 07/27/17 0521  BP: (!) 138/92 (!) 169/115 (!) 146/107 (!) 156/108  Pulse: 69 71 70 69  Resp: 18 18  18   Temp: 99.1 F (37.3 C) 98.5 F (36.9 C)  98.5 F (36.9 C)  TempSrc: Oral Oral  Oral  SpO2: 98% 100% 100% 100%  Weight:    87.9 kg (193 lb 11.2 oz)  Height:        Intake/Output Summary (Last 24 hours) at 07/27/2017 1103 Last data filed at 07/27/2017  0800 Gross per 24 hour  Intake 963 ml  Output 1100 ml  Net -137 ml   Filed Weights   07/24/17 1909 07/26/17 0140 07/27/17 0521  Weight: 97.1 kg (214 lb) 87.7 kg (193 lb 6.4 oz) 87.9 kg (193 lb 11.2 oz)    Examination: General exam: Appears calm and comfortable  Respiratory system: Clear to auscultation. Respiratory effort normal. Cardiovascular system: S1 & S2 heard, RRR. No JVD, murmurs, rubs, gallops or clicks. No pedal edema, improved  Gastrointestinal system: Abdomen is nondistended, soft and nontender. No organomegaly or masses felt. Normal bowel sounds heard. Central nervous system: Alert and oriented. No focal neurological deficits. Extremities: Symmetric 5 x 5 power. Skin: +scrapes right shin without acute infection  Psychiatry: Judgement and insight appear normal. Mood & affect appropriate.     Data Reviewed: I have personally reviewed following labs and imaging studies  CBC: Recent Labs  Lab 07/24/17 1921 07/25/17 0209 07/26/17 0449 07/27/17 0352  WBC 9.9 9.7 5.4 3.2*  NEUTROABS  --  5.4  --   --   HGB 13.8 13.9 12.8* 13.6  HCT 41.2 41.3 38.7* 40.2  MCV 71.5* 71.3* 71.0* 71.0*  PLT 140* 130* 129* 143*   Basic Metabolic Panel: Recent Labs  Lab 07/24/17 1921 07/25/17 0044 07/25/17 0209 07/26/17 0449 07/27/17 0352  NA 134*  --  136 137 138  K 3.0*  --  3.4* 3.5 4.0  CL 101  --  100* 102 102  CO2 23  --  24 25 26   GLUCOSE 127*  --  98 115* 111*  BUN 14  --  13 18 21*  CREATININE 1.16  --  1.24 1.27* 1.27*  CALCIUM 8.7*  --  8.7* 8.8* 9.1  MG  --  1.4*  --  1.9 1.7   GFR: Estimated Creatinine Clearance: 70.4 mL/min (A) (by C-G formula based on SCr of 1.27 mg/dL (H)). Liver Function Tests: No results for input(s): AST, ALT, ALKPHOS, BILITOT, PROT, ALBUMIN in the last 168 hours. No results for input(s): LIPASE, AMYLASE in the last 168 hours. No results for input(s): AMMONIA in the last 168 hours. Coagulation Profile: No results for input(s): INR,  PROTIME in the last 168 hours. Cardiac Enzymes: Recent Labs  Lab 07/24/17 2004 07/25/17 0044  TROPONINI 0.06* 0.05*   BNP (last 3 results) No results for input(s): PROBNP in the last 8760 hours. HbA1C: No results for input(s): HGBA1C in the last 72 hours. CBG: No results for input(s): GLUCAP in the last 168 hours. Lipid Profile: No results for input(s): CHOL, HDL, LDLCALC, TRIG, CHOLHDL, LDLDIRECT in the last 72 hours. Thyroid Function Tests: Recent Labs    07/25/17 0101  TSH 3.638   Anemia Panel: No results for input(s): VITAMINB12, FOLATE, FERRITIN, TIBC, IRON, RETICCTPCT in the last 72 hours. Sepsis Labs: Recent Labs  Lab  07/24/17 1929  LATICACIDVEN 1.22    No results found for this or any previous visit (from the past 240 hour(s)).     Radiology Studies: Dg Chest 2 View  Result Date: 07/26/2017 CLINICAL DATA:  Shortness of breath, chest pain. EXAM: CHEST - 2 VIEW COMPARISON:  Radiographs of July 24, 2017 FINDINGS: Stable cardiomegaly. No pneumothorax or pleural effusion is noted. Both lungs are clear. The visualized skeletal structures are unremarkable. IMPRESSION: No active cardiopulmonary disease. Electronically Signed   By: Lupita Raider, M.D.   On: 07/26/2017 11:24      Scheduled Meds: . amLODipine  10 mg Oral Daily  . aspirin  81 mg Oral Daily  . carvedilol  3.125 mg Oral BID WC  . cloNIDine  0.2 mg Oral BID  . enoxaparin (LOVENOX) injection  40 mg Subcutaneous Q24H  . furosemide  60 mg Intravenous BID  . lisinopril  20 mg Oral Daily  . pantoprazole  40 mg Oral Daily  . sodium chloride flush  3 mL Intravenous Q12H   Continuous Infusions: . sodium chloride       LOS: 2 days    Time spent: 25 minutes   Noralee Stain, DO [facility] [email or web address] Password [facility] 07/27/2017, 11:03 AM

## 2017-07-27 NOTE — Progress Notes (Signed)
SATURATION QUALIFICATIONS: (This note is used to comply with regulatory documentation for home oxygen)  Patient Saturations on Room Air at Rest = 95   Patient Saturations on Room Air while Ambulating = 96   Patient Saturations on0 Liters of oxygen while Ambulating = NA Please briefly explain why patient needs home oxygen: no need at this time, tolerated well

## 2017-07-27 NOTE — Progress Notes (Signed)
Pt continues with frequent coughing and orthopnea. Pt sat upright. VS stable, but pt continues with HTN. 2L [place] applied. Lungs clear. Robitussin currently ineffective. MD paged. Will continue to monitor.

## 2017-07-27 NOTE — Care Management Note (Signed)
Case Management Note  Patient Details  Name: [name] MRN: [id number] Date of Birth: July 29, 1960  Subjective/Objective:   CHF                Action/Plan: Patient lives at home with his spouse and children ( ages 627, 234 and 3); works at [employer]; no [employer] insurance, no PCP; patient is [facility]able to [facility]; CM talked to patient about considering obtaining private medical insurance through his job; patient is independent of all of his ADL's; CM will continue to follow for progression of care.  Expected Discharge Date:    possibly 07/29/2017              Expected Discharge Plan:  Home/Self Care  In-House Referral:   Financial Counselor     Status of Service:  In process, will continue to follow  Reola MosherChandler, Tashari Schoenfelder L, RN,MHA,BSN [phone] 07/27/2017, 2:43 PM

## 2017-07-28 LAB — BASIC METABOLIC PANEL
ANION GAP: 13 (ref 5–15)
BUN: 26 mg/dL — AB (ref 6–20)
CALCIUM: 9.3 mg/dL (ref 8.9–10.3)
CO2: 24 mmol/L (ref 22–32)
Chloride: 100 mmol/L — ABNORMAL LOW (ref 101–111)
Creatinine, Ser: 1.41 mg/dL — ABNORMAL HIGH (ref 0.61–1.24)
GFR calc Af Amer: >60 mL/min (ref >60)
GFR, EST NON AFRICAN AMERICAN: 54 mL/min — AB (ref >60)
GLUCOSE: 124 mg/dL — AB (ref 65–99)
Potassium: 3.2 mmol/L — ABNORMAL LOW (ref 3.5–5.1)
Sodium: 137 mmol/L (ref 135–145)

## 2017-07-28 LAB — CBC
HCT: 42.6 % (ref 39.0–52.0)
HEMOGLOBIN: 14.4 g/dL (ref 13.0–17.0)
MCH: 23.8 pg — AB (ref 26.0–34.0)
MCHC: 33.8 g/dL (ref 30.0–36.0)
MCV: 70.5 fL — ABNORMAL LOW (ref 78.0–100.0)
Platelets: 168 10*3/uL (ref 150–400)
RBC: 6.04 MIL/uL — ABNORMAL HIGH (ref 4.22–5.81)
RDW: 13.4 % (ref 11.5–15.5)
WBC: 4 10*3/uL (ref 4.0–10.5)

## 2017-07-28 LAB — MAGNESIUM: MAGNESIUM: 1.7 mg/dL (ref 1.7–2.4)

## 2017-07-28 MED ORDER — FUROSEMIDE 20 MG PO TABS: 60.0000 mg | ORAL_TABLET | Freq: Two times a day (BID) | ORAL | 0 refills | Status: DC

## 2017-07-28 MED ORDER — DEXTROMETHORPHAN HBR 7.5 MG/5ML PO SYRP: 7.5000 mg | ORAL_SOLUTION | Freq: Four times a day (QID) | ORAL | 0 refills | Status: DC | PRN

## 2017-07-28 MED ORDER — POTASSIUM CHLORIDE ER 10 MEQ PO TBCR: 10.0000 meq | EXTENDED_RELEASE_TABLET | Freq: Two times a day (BID) | ORAL | 0 refills | Status: DC

## 2017-07-28 MED ORDER — CLONIDINE HCL 0.2 MG PO TABS: 0.2000 mg | ORAL_TABLET | Freq: Two times a day (BID) | ORAL | 0 refills | Status: DC

## 2017-07-28 MED ORDER — AMLODIPINE BESYLATE 10 MG PO TABS: 10.0000 mg | ORAL_TABLET | Freq: Every day | ORAL | 0 refills | Status: DC

## 2017-07-28 MED ORDER — CARVEDILOL 3.125 MG PO TABS: 3.1250 mg | ORAL_TABLET | Freq: Two times a day (BID) | ORAL | 0 refills | Status: DC

## 2017-07-28 MED ORDER — POTASSIUM CHLORIDE CRYS ER 20 MEQ PO TBCR
40.0000 meq | EXTENDED_RELEASE_TABLET | ORAL | Status: DC
  Administered 2017-07-28: 40 meq via ORAL
  Filled 2017-07-28: qty 2

## 2017-07-28 MED ORDER — LISINOPRIL 20 MG PO TABS: 20.0000 mg | ORAL_TABLET | Freq: Every day | ORAL | 0 refills | Status: DC

## 2017-07-28 NOTE — Discharge Summary (Signed)
Physician Discharge Summary  [name] ZOX:[id number] DOB: 1960-09-09 DOA: 07/24/2017  PCP: Massie Maroon, FNP  Admit date: 07/24/2017 Discharge date: 07/28/2017  Admitted From: Home Disposition:  Home  Recommendations for Outpatient Follow-up:  1. Follow up with PCP in 1 week, encouraged to establish with PCP. Given information to call [facility] and [facility].  2. Please obtain BMP in 1 week to recheck Cr and potassium (hypokalemia replaced prior to discharge)  3. Please follow up on the following pending results: final blood culture result 4. Recommend outpatient sleep study to evaluate and treat suspected sleep apnea   Discharge Condition: Stable CODE STATUS: Full  Diet recommendation: Heart healthy  Brief/Interim Summary: [name] a 57 y.o.malewith medical history significant ofHTN, HLD, aortic stenosis,andgoutwho presents with complaints of chest pain. Symptoms initially started approximately 5days ago,when 4tables fell onto his right leg while he was at work. Since that time he been less mobile. Patient reports developing a "weird cough"that he gets when he tries to lay down. He does not notice the cough as much when sitting up. Patient's significant other notes that he has had increased warmth and that his leftleg is swollen twice the size of the rightleg.He has been limping for the last 2 days. Since that time he is pain also complaining of dull central chest pressure that radiated to his neck and down his legs. In route with EMS patient complained of 8 out of 10 chest pressure radiating to his neck and reportedly was given 324 mg of aspirin, nitroglycerin, and 4 mg of Zofran for nausea symptoms.  He was admitted for further workup and evaluation of chest pain, suspected CHF.  Troponin was trended which remained flat and not consistent with ACS. Echocardiogram revealed severe LVH, EF 50-55% with restrictive physiology and no wall motion  abnormalities. He was treated with IV lasix which was titrated. He was weaned off oxygen to room air. Due to low grade fever and night sweats, blood cultures were obtained which remained negative at day of discharge. He has now remained afebrile with no source of infection found.   Discharge Diagnoses:  Principal Problem:   Acute diastolic CHF (congestive heart failure) (HCC) Active Problems:   Chest pain   Hypertensive urgency   Hypokalemia   Cellulitis of right leg  Acute diastolic CHF exacerbation -Echocardiogram showed severe LVH, EF 50-55%, restrictive physiology, no wall motion abnormalities  -Strict I/Os, daily weight  -Improved in respiratory status and peripheral edema  -Lasix 60mg  PO BID on discharge   Chest pain -D dimer elevated, CTA chest negative for PE  -Troponin 0.04 --> 0.06 --> 0.05  -EKG reviewed independently, normal sinus rhythm with T wave inversion in lateral leads, similar in appearance to previous EKG in 2017 and 2019  -Echocardiogram without wall motion abnormalities -Denies chest pain today   Hypertensive urgency -BP 185/118 on admission, now improved  -Continue amlodipine, clonidine, lisinopril, coreg, lasix (stop HCTZ)  -BP improved    Doubt cellulitis -Patient has scrapes of right shin due to trauma sustained when table fell on his leg. His leg does not appear erythematous, wound appears to be healing well without drainage -Stop cefazolin and monitor  Fever -CXR without active cardiopulmonary disease  -Blood culture negative to date -No further fevers   -Monitor off antibiotics. No sign of infection.   Hx gout -Continue colchicine  GERD -Continue protonix  OSA -States he wakes up in the middle of the night gasping for air. He states he  had a sleep study > 10 years ago and was diagnosed with sleep apnea but does not wear CPAP mask. Encouraged him to obtain new sleep study    Discharge Instructions  Discharge Instructions     (HEART FAILURE PATIENTS) Call MD:  Anytime you have any of the following symptoms: 1) 3 pound weight gain in 24 hours or 5 pounds in 1 week 2) shortness of breath, with or without a dry hacking cough 3) swelling in the hands, feet or stomach 4) if you have to sleep on extra pillows at night in order to breathe.   Complete by:  As directed    Call MD for:  difficulty breathing, headache or visual disturbances   Complete by:  As directed    Call MD for:  extreme fatigue   Complete by:  As directed    Call MD for:  hives   Complete by:  As directed    Call MD for:  persistant dizziness or light-headedness   Complete by:  As directed    Call MD for:  persistant nausea and vomiting   Complete by:  As directed    Call MD for:  severe uncontrolled pain   Complete by:  As directed    Call MD for:  temperature >100.4   Complete by:  As directed    Diet - low sodium heart healthy   Complete by:  As directed    Discharge instructions   Complete by:  As directed    You were cared for by a hospitalist during your hospital stay. If you have any questions about your discharge medications or the care you received while you were in the hospital after you are discharged, you can call the unit and asked to speak with the hospitalist on call if the hospitalist that took care of you is not available. Once you are discharged, your primary care physician will handle any further medical issues. Please note that NO REFILLS for any discharge medications will be authorized once you are discharged, as it is imperative that you return to your primary care physician (or establish a relationship with a primary care physician if you do not have one) for your aftercare needs so that they can reassess your need for medications and monitor your lab values.   Increase activity slowly   Complete by:  As directed      Allergies as of 07/28/2017      Reactions   Adhesive [tape] Other (See Comments)   Makes the skin feel as if  it is burning, will also bruise the skin.   Latex Hives, Itching   Burns skin      Medication List    STOP taking these medications   hydrochlorothiazide 25 MG tablet Commonly known as:  HYDRODIURIL   HYDROcodone-acetaminophen 5-325 MG tablet Commonly known as:  NORCO/VICODIN     TAKE these medications   acetaminophen 325 MG tablet Commonly known as:  TYLENOL Take 2 tablets (650 mg total) by mouth every 6 (six) hours as needed for moderate pain.   amLODipine 10 MG tablet Commonly known as:  NORVASC Take 1 tablet (10 mg total) by mouth daily.   aspirin 81 MG chewable tablet Chew 1 tablet (81 mg total) by mouth daily.   carvedilol 3.125 MG tablet Commonly known as:  COREG Take 1 tablet (3.125 mg total) by mouth 2 (two) times daily with a meal.   cloNIDine 0.2 MG tablet Commonly known as:  CATAPRES Take 1  tablet (0.2 mg total) by mouth 2 (two) times daily.   colchicine 0.6 MG tablet Take 1 tablet (0.6 mg total) by mouth daily. What changed:    when to take this  reasons to take this   dextromethorphan 7.5 MG/5ML Syrp Take 5 mLs (7.5 mg total) by mouth every 6 (six) hours as needed (cough).   furosemide 20 MG tablet Commonly known as:  LASIX Take 3 tablets (60 mg total) by mouth 2 (two) times daily.   lisinopril 20 MG tablet Commonly known as:  PRINIVIL,ZESTRIL Take 1 tablet (20 mg total) by mouth daily.   omeprazole 40 MG capsule Commonly known as:  PRILOSEC Take 40 mg by mouth daily.   potassium chloride 10 MEQ tablet Commonly known as:  K-DUR Take 1 tablet (10 mEq total) by mouth 2 (two) times daily. Take with Lasix (furosemide)      Follow-up Information    [facility]. Call today.   Why:  Make appointment to establish with PCP  Contact information: [address] [place] [address] [phone]         Allergies  Allergen Reactions  . Adhesive [Tape] Other (See Comments)    Makes the  skin feel as if it is burning, will also bruise the skin.  . Latex Hives and Itching    Burns skin    Consultations:  None   Procedures/Studies: Dg Chest 2 View  Result Date: 07/26/2017 CLINICAL DATA:  Shortness of breath, chest pain. EXAM: CHEST - 2 VIEW COMPARISON:  Radiographs of July 24, 2017 FINDINGS: Stable cardiomegaly. No pneumothorax or pleural effusion is noted. Both lungs are clear. The visualized skeletal structures are unremarkable. IMPRESSION: No active cardiopulmonary disease. Electronically Signed   By: Lupita Raider, M.D.   On: 07/26/2017 11:24   Dg Chest 2 View  Result Date: 07/24/2017 CLINICAL DATA:  Chest pain. EXAM: CHEST - 2 VIEW COMPARISON:  Chest x-[place]oral dated April 25, 2017. FINDINGS: Stable mild cardiomegaly. Normal pulmonary vascularity. No focal consolidation, pleural effusion, or pneumothorax. No acute osseous abnormality. IMPRESSION: No active cardiopulmonary disease. Electronically Signed   By: Obie Dredge M.D.   On: 07/24/2017 20:29   Ct Angio Chest Pe W Or Wo Contrast  Result Date: 07/25/2017 CLINICAL DATA:  Acute onset of generalized chest pressure, radiating to the neck. Shortness of breath and nausea. Bilateral lower extremity swelling. EXAM: CT ANGIOGRAPHY CHEST WITH CONTRAST TECHNIQUE: Multidetector CT imaging of the chest was performed using the standard protocol during bolus administration of intravenous contrast. Multiplanar CT image reconstructions and MIPs were obtained to evaluate the vascular anatomy. CONTRAST:  ISOVUE-370 IOPAMIDOL (ISOVUE-370) INJECTION 76% COMPARISON:  Chest radiograph performed 07/24/2017 FINDINGS: Cardiovascular:  There is no evidence of pulmonary embolus. The heart is borderline normal in size. Scattered coronary artery calcifications are seen. Mild calcification is noted along the aortic arch and proximal left subclavian artery. Calcification is also noted at the aortic valve. Mediastinum/Nodes: The mediastinum is  otherwise unremarkable. No mediastinal lymphadenopathy is seen. No pericardial effusion is identified. The visualized portions of the thyroid gland are unremarkable. No axillary lymphadenopathy is appreciated. Lungs/Pleura: Minimal bilateral atelectasis is noted. The lungs are otherwise clear. No pleural effusion or pneumothorax is seen. No masses are identified. Upper Abdomen: The visualized portions of the liver and spleen are unremarkable. There is reflux of contrast into the hepatic veins and IVC. The visualized portions of the pancreas, adrenal glands and kidneys are within normal limits.  Musculoskeletal: No acute osseous abnormalities are identified. The visualized musculature is unremarkable in appearance. Review of the MIP images confirms the above findings. IMPRESSION: 1. No evidence of pulmonary embolus. 2. Minimal bilateral atelectasis noted.  Lungs otherwise clear. 3. Reflux of contrast noted into the hepatic veins and IVC. 4. Scattered coronary artery calcifications seen. Calcification at the aortic valve. Electronically Signed   By: Roanna Raider M.D.   On: 07/25/2017 05:34    Echo 4/23 Study Conclusions  - Left ventricle: The cavity size was normal. Wall thickness was   increased in a pattern of severe LVH. Systolic function was   normal. The estimated ejection fraction was in the range of 50%   to 55%. Wall motion was normal; there were no regional wall   motion abnormalities. Doppler parameters are consistent with   restrictive physiology, indicative of decreased left ventricular   diastolic compliance and/or increased left atrial pressure. - Aortic valve: Valve mobility was restricted. There was moderate   stenosis. There was trivial regurgitation. Valve area (VTI): 1.13   cm^2. Valve area (Vmax): 1.06 cm^2. Valve area (Vmean): 1.14   cm^2. - Mitral valve: Calcified annulus. Mildly thickened leaflets .   There was mild regurgitation. - Left atrium: The atrium was severely  dilated. - Right atrium: The atrium was mildly dilated.  Impressions:  - Normal LV systolic function; restrictive filling; severe LVH;   heavily calcified aortic valve with moderate AS by doppler (mean   gradient 22 mmHg); mild MR; severe LAE; mild RAE.    Discharge Exam: Vitals:   07/28/17 0024 07/28/17 0445  BP: (!) 148/87 (!) 152/97  Pulse: (!) 58 63  Resp: 18 18  Temp: 98.7 F (37.1 C) 99.1 F (37.3 C)  SpO2: 98% 98%    General: Pt is alert, awake, not in acute distress Cardiovascular: RRR, S1/S2 +, no rubs, no gallops Respiratory: CTA bilaterally, no wheezing, no rhonchi Abdominal: Soft, NT, ND, bowel sounds + Extremities: no edema, no cyanosis    The results of significant diagnostics from this hospitalization (including imaging, microbiology, ancillary and laboratory) are listed below for reference.     Microbiology: Recent Results (from the past 240 hour(s))  Culture, blood (routine x 2)     Status: None (Preliminary result)   Collection Time: 07/26/17 11:11 AM  Result Value Ref Range Status   Specimen Description BLOOD LEFT HAND  Final   Special Requests   Final    BOTTLES DRAWN AEROBIC AND ANAEROBIC Blood Culture adequate volume   Culture   Final    NO GROWTH 1 DAY Performed at [facility], [address] [place], [place] [address]    Report Status PENDING  Incomplete  Culture, blood (routine x 2)     Status: None (Preliminary result)   Collection Time: 07/26/17 11:12 AM  Result Value Ref Range Status   Specimen Description BLOOD LEFT ARM  Final   Special Requests   Final    BOTTLES DRAWN AEROBIC AND ANAEROBIC Blood Culture adequate volume   Culture   Final    NO GROWTH 1 DAY Performed at [facility], [address], [place], [place] [address]    Report Status PENDING  Incomplete     Labs: BNP (last 3 results) Recent Labs    07/24/17 1950  BNP 384.9*   Basic Metabolic Panel: Recent Labs  Lab 07/24/17 1921 07/25/17 0044  07/25/17 0209 07/26/17 0449 07/27/17 0352 07/28/17 0515  NA 134*  --  136 137 138 137  K 3.0*  --  3.4* 3.5 4.0 3.2*  CL 101  --  100* 102 102 100*  CO2 23  --  24 25 26 24   GLUCOSE 127*  --  98 115* 111* 124*  BUN 14  --  13 18 21* 26*  CREATININE 1.16  --  1.24 1.27* 1.27* 1.41*  CALCIUM 8.7*  --  8.7* 8.8* 9.1 9.3  MG  --  1.4*  --  1.9 1.7 1.7   Liver Function Tests: No results for input(s): AST, ALT, ALKPHOS, BILITOT, PROT, ALBUMIN in the last 168 hours. No results for input(s): LIPASE, AMYLASE in the last 168 hours. No results for input(s): AMMONIA in the last 168 hours. CBC: Recent Labs  Lab 07/24/17 1921 07/25/17 0209 07/26/17 0449 07/27/17 0352 07/28/17 0515  WBC 9.9 9.7 5.4 3.2* 4.0  NEUTROABS  --  5.4  --   --   --   HGB 13.8 13.9 12.8* 13.6 14.4  HCT 41.2 41.3 38.7* 40.2 42.6  MCV 71.5* 71.3* 71.0* 71.0* 70.5*  PLT 140* 130* 129* 143* 168   Cardiac Enzymes: Recent Labs  Lab 07/24/17 2004 07/25/17 0044  TROPONINI 0.06* 0.05*   BNP: Invalid input(s): POCBNP CBG: No results for input(s): GLUCAP in the last 168 hours. D-Dimer No results for input(s): DDIMER in the last 72 hours. Hgb A1c No results for input(s): HGBA1C in the last 72 hours. Lipid Profile No results for input(s): CHOL, HDL, LDLCALC, TRIG, CHOLHDL, LDLDIRECT in the last 72 hours. Thyroid function studies No results for input(s): TSH, T4TOTAL, T3FREE, THYROIDAB in the last 72 hours.  Invalid input(s): FREET3 Anemia work up No results for input(s): VITAMINB12, FOLATE, FERRITIN, TIBC, IRON, RETICCTPCT in the last 72 hours. Urinalysis    Component Value Date/Time   COLORURINE YELLOW 04/25/2017 2245   APPEARANCEUR CLEAR 04/25/2017 2245   LABSPEC 1.023 04/25/2017 2245   PHURINE 5.0 04/25/2017 2245   GLUCOSEU NEGATIVE 04/25/2017 2245   HGBUR NEGATIVE 04/25/2017 2245   BILIRUBINUR NEGATIVE 04/25/2017 2245   KETONESUR NEGATIVE 04/25/2017 2245   PROTEINUR >=300 (A) 04/25/2017 2245    UROBILINOGEN 0.2 03/18/2014 2345   NITRITE NEGATIVE 04/25/2017 2245   LEUKOCYTESUR NEGATIVE 04/25/2017 2245   Sepsis Labs Invalid input(s): PROCALCITONIN,  WBC,  LACTICIDVEN Microbiology Recent Results (from the past 240 hour(s))  Culture, blood (routine x 2)     Status: None (Preliminary result)   Collection Time: 07/26/17 11:11 AM  Result Value Ref Range Status   Specimen Description BLOOD LEFT HAND  Final   Special Requests   Final    BOTTLES DRAWN AEROBIC AND ANAEROBIC Blood Culture adequate volume   Culture   Final    NO GROWTH 1 DAY Performed at [facility], [address], [place], [place] [address]    Report Status PENDING  Incomplete  Culture, blood (routine x 2)     Status: None (Preliminary result)   Collection Time: 07/26/17 11:12 AM  Result Value Ref Range Status   Specimen Description BLOOD LEFT ARM  Final   Special Requests   Final    BOTTLES DRAWN AEROBIC AND ANAEROBIC Blood Culture adequate volume   Culture   Final    NO GROWTH 1 DAY Performed at [name], [address], [place], [place] [address]    Report Status PENDING  Incomplete     Patient was seen and examined on the day of discharge and was found to be  in stable condition. Time coordinating discharge: 35 minutes including assessment and coordination of care, as well as examination of the patient.   SIGNED:  Noralee StainJennifer Dameion Briles, DO [facility] Pager [phone]  If 7PM-7AM, please contact night-coverage [email or web address] Password [email or web address] 07/28/2017, 9:48 AM

## 2017-07-28 NOTE — Progress Notes (Signed)
Given discharge instructions

## 2017-07-31 LAB — CULTURE, BLOOD (ROUTINE X 2)
CULTURE: NO GROWTH
Culture: NO GROWTH
SPECIAL REQUESTS: ADEQUATE
SPECIAL REQUESTS: ADEQUATE

## 2017-08-09 ENCOUNTER — Inpatient Hospital Stay: Payer: Self-pay

## 2017-08-09 ENCOUNTER — Ambulatory Visit: Payer: Self-pay | Attending: Internal Medicine | Admitting: Physician Assistant

## 2017-08-09 VITALS — BP 210/129 | HR 73 | Temp 99.5°F | Resp 16 | Ht 69.0 in | Wt 195.2 lb

## 2017-08-09 DIAGNOSIS — M549 Dorsalgia, unspecified: Secondary | ICD-10-CM | POA: Insufficient documentation

## 2017-08-09 DIAGNOSIS — G8929 Other chronic pain: Secondary | ICD-10-CM | POA: Insufficient documentation

## 2017-08-09 DIAGNOSIS — Z7982 Long term (current) use of aspirin: Secondary | ICD-10-CM | POA: Insufficient documentation

## 2017-08-09 DIAGNOSIS — Z91199 Patient's noncompliance with other medical treatment and regimen due to unspecified reason: Secondary | ICD-10-CM

## 2017-08-09 DIAGNOSIS — I350 Nonrheumatic aortic (valve) stenosis: Secondary | ICD-10-CM

## 2017-08-09 DIAGNOSIS — Z09 Encounter for follow-up examination after completed treatment for conditions other than malignant neoplasm: Secondary | ICD-10-CM

## 2017-08-09 DIAGNOSIS — I10 Essential (primary) hypertension: Secondary | ICD-10-CM

## 2017-08-09 DIAGNOSIS — Z79899 Other long term (current) drug therapy: Secondary | ICD-10-CM | POA: Insufficient documentation

## 2017-08-09 DIAGNOSIS — I110 Hypertensive heart disease with heart failure: Secondary | ICD-10-CM | POA: Insufficient documentation

## 2017-08-09 DIAGNOSIS — I5031 Acute diastolic (congestive) heart failure: Secondary | ICD-10-CM

## 2017-08-09 DIAGNOSIS — I161 Hypertensive emergency: Secondary | ICD-10-CM

## 2017-08-09 DIAGNOSIS — Z9119 Patient's noncompliance with other medical treatment and regimen: Secondary | ICD-10-CM | POA: Insufficient documentation

## 2017-08-09 DIAGNOSIS — K219 Gastro-esophageal reflux disease without esophagitis: Secondary | ICD-10-CM | POA: Insufficient documentation

## 2017-08-09 DIAGNOSIS — G4730 Sleep apnea, unspecified: Secondary | ICD-10-CM

## 2017-08-09 DIAGNOSIS — I5033 Acute on chronic diastolic (congestive) heart failure: Secondary | ICD-10-CM | POA: Insufficient documentation

## 2017-08-09 MED ORDER — LISINOPRIL 20 MG PO TABS: 20.0000 mg | ORAL_TABLET | Freq: Every day | ORAL | 3 refills | Status: DC

## 2017-08-09 MED ORDER — POTASSIUM CHLORIDE ER 10 MEQ PO TBCR: 10.0000 meq | EXTENDED_RELEASE_TABLET | Freq: Two times a day (BID) | ORAL | 3 refills | Status: DC

## 2017-08-09 MED ORDER — CLONIDINE HCL 0.2 MG PO TABS
0.2000 mg | ORAL_TABLET | Freq: Once | ORAL | Status: AC
  Administered 2017-08-09: 0.2 mg via ORAL

## 2017-08-09 MED ORDER — CARVEDILOL 3.125 MG PO TABS: 3.1250 mg | ORAL_TABLET | Freq: Two times a day (BID) | ORAL | 3 refills | Status: DC

## 2017-08-09 MED ORDER — FUROSEMIDE 20 MG PO TABS: 60.0000 mg | ORAL_TABLET | Freq: Two times a day (BID) | ORAL | 0 refills | Status: DC

## 2017-08-09 MED ORDER — CLONIDINE HCL 0.2 MG PO TABS: 0.2000 mg | ORAL_TABLET | Freq: Two times a day (BID) | ORAL | 3 refills | Status: DC

## 2017-08-09 MED ORDER — POTASSIUM CHLORIDE ER 10 MEQ PO TBCR: 10.0000 meq | EXTENDED_RELEASE_TABLET | Freq: Two times a day (BID) | ORAL | 0 refills | Status: DC

## 2017-08-09 MED ORDER — ASPIRIN 81 MG PO CHEW
81.0000 mg | CHEWABLE_TABLET | Freq: Every day | ORAL | 3 refills | Status: DC
Start: 2017-08-09 — End: 2019-11-17

## 2017-08-09 MED ORDER — AMLODIPINE BESYLATE 10 MG PO TABS
10.0000 mg | ORAL_TABLET | Freq: Every day | ORAL | 3 refills | Status: DC
Start: 2017-08-09 — End: 2017-12-12

## 2017-08-09 MED FILL — cloNIDine HCL 0.2 MG TABS: 0.2 | 30 days supply | Qty: 60 | Fill #0

## 2017-08-09 MED FILL — LISINOPRIL 20 MG TAB: 20 | 30 days supply | Qty: 30 | Fill #0

## 2017-08-09 MED FILL — CARVEDILOL 3.125 MG TABLET: 3.125 | 30 days supply | Qty: 60 | Fill #0

## 2017-08-09 MED FILL — POTASSIUM CL ER 10 MEQ TAB: 10 | 30 days supply | Qty: 60 | Fill #0

## 2017-08-09 MED FILL — FUROSEMIDE 20 MG TABLET: 20 | 30 days supply | Qty: 180 | Fill #0

## 2017-08-09 MED FILL — AMLODIPINE BESYLATE 10 MG T: 10 | 30 days supply | Qty: 30 | Fill #0
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
## 2017-08-09 NOTE — Progress Notes (Signed)
Pt. Stated he have a dry cough, shortness of breath, and tightness in chest.

## 2017-08-10 ENCOUNTER — Other Ambulatory Visit: Payer: Self-pay | Admitting: Physician Assistant

## 2017-08-10 DIAGNOSIS — G4730 Sleep apnea, unspecified: Secondary | ICD-10-CM

## 2017-08-10 LAB — COMPREHENSIVE METABOLIC PANEL
ALT: 15 IU/L (ref 0–44)
AST: 20 IU/L (ref 0–40)
Albumin/Globulin Ratio: 1 — ABNORMAL LOW (ref 1.2–2.2)
Albumin: 3.7 g/dL (ref 3.5–5.5)
Alkaline Phosphatase: 72 IU/L (ref 39–117)
BILIRUBIN TOTAL: 0.6 mg/dL (ref 0.0–1.2)
BUN/Creatinine Ratio: 13 (ref 9–20)
BUN: 14 mg/dL (ref 6–24)
CALCIUM: 9.5 mg/dL (ref 8.7–10.2)
CHLORIDE: 104 mmol/L (ref 96–106)
CO2: 22 mmol/L (ref 20–29)
Creatinine, Ser: 1.09 mg/dL (ref 0.76–1.27)
GFR, EST AFRICAN AMERICAN: 87 mL/min/{1.73_m2} (ref >59)
GFR, EST NON AFRICAN AMERICAN: 75 mL/min/{1.73_m2} (ref >59)
GLUCOSE: 97 mg/dL (ref 65–99)
Globulin, Total: 3.7 g/dL (ref 1.5–4.5)
Potassium: 3.6 mmol/L (ref 3.5–5.2)
Sodium: 142 mmol/L (ref 134–144)
TOTAL PROTEIN: 7.4 g/dL (ref 6.0–8.5)

## 2017-08-11 ENCOUNTER — Telehealth: Payer: Self-pay

## 2017-08-11 NOTE — Telephone Encounter (Signed)
CMA called patient to inform on lab results.  Pt. Understood.

## 2017-08-11 NOTE — Telephone Encounter (Signed)
-----   Message from Anders Simmonds, [place] sent at 08/10/2017  9:26 AM EDT ----- Your labs have improved since being in the hospital.  Take ALL medications as prescribed.  Follow up as planned.  Go to the emergency dept/call 911 if you have any concerning problems/signs/symptoms as we discussed.  Thanks, Georgian Co, PA-C

## 2017-08-14 ENCOUNTER — Ambulatory Visit: Payer: Self-pay | Attending: Family Medicine | Admitting: Family Medicine

## 2017-08-14 ENCOUNTER — Encounter: Payer: Self-pay | Admitting: Family Medicine

## 2017-08-14 VITALS — BP 155/87 | HR 65 | Temp 98.4°F | Ht 69.0 in | Wt 195.2 lb

## 2017-08-14 DIAGNOSIS — Z79899 Other long term (current) drug therapy: Secondary | ICD-10-CM | POA: Insufficient documentation

## 2017-08-14 DIAGNOSIS — Z7982 Long term (current) use of aspirin: Secondary | ICD-10-CM | POA: Insufficient documentation

## 2017-08-14 DIAGNOSIS — I350 Nonrheumatic aortic (valve) stenosis: Secondary | ICD-10-CM | POA: Insufficient documentation

## 2017-08-14 DIAGNOSIS — Z888 Allergy status to other drugs, medicaments and biological substances status: Secondary | ICD-10-CM | POA: Insufficient documentation

## 2017-08-14 DIAGNOSIS — M109 Gout, unspecified: Secondary | ICD-10-CM | POA: Insufficient documentation

## 2017-08-14 DIAGNOSIS — I5031 Acute diastolic (congestive) heart failure: Secondary | ICD-10-CM

## 2017-08-14 DIAGNOSIS — I110 Hypertensive heart disease with heart failure: Secondary | ICD-10-CM | POA: Insufficient documentation

## 2017-08-14 DIAGNOSIS — E7800 Pure hypercholesterolemia, unspecified: Secondary | ICD-10-CM | POA: Insufficient documentation

## 2017-08-14 DIAGNOSIS — M25462 Effusion, left knee: Secondary | ICD-10-CM | POA: Insufficient documentation

## 2017-08-14 DIAGNOSIS — K219 Gastro-esophageal reflux disease without esophagitis: Secondary | ICD-10-CM | POA: Insufficient documentation

## 2017-08-14 DIAGNOSIS — I5032 Chronic diastolic (congestive) heart failure: Secondary | ICD-10-CM | POA: Insufficient documentation

## 2017-08-14 DIAGNOSIS — I10 Essential (primary) hypertension: Secondary | ICD-10-CM

## 2017-08-14 MED ORDER — FUROSEMIDE 40 MG PO TABS: 80.0000 mg | ORAL_TABLET | Freq: Two times a day (BID) | ORAL | 1 refills | Status: DC

## 2017-08-14 MED ORDER — KETOROLAC TROMETHAMINE 60 MG/2ML IM SOLN
60.0000 mg | Freq: Once | INTRAMUSCULAR | Status: AC
  Administered 2017-08-14: 60 mg via INTRAMUSCULAR
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
## 2017-08-14 NOTE — Patient Instructions (Signed)
Knee Effusion Knee effusion means that you have extra fluid in your knee. This can cause pain. Your knee may be more difficult to bend and move. Follow these instructions at home:  Use crutches as told by your doctor.  Wear a knee brace as told by your doctor.  Apply ice to the swollen area: ? Put ice in a plastic bag. ? Place a towel between your skin and the bag. ? Leave the ice on for 20 minutes, 2-3 times per day.  Keep your knee raised (elevated) when you are sitting or lying down.  Take medicines only as told by your doctor.  Do any rehabilitation or strengthening exercises as told by your doctor.  Rest your knee as told by your doctor. You may start doing your normal activities again when your doctor says it is okay.  Keep all follow-up visits as told by your doctor. This is important. Contact a doctor if:  You continue to have pain in your knee. Get help right away if:  You have increased swelling or redness of your knee.  You have severe pain in your knee.  You have a fever. This information is not intended to replace advice given to you by your health care provider. Make sure you discuss any questions you have with your health care provider. Document Released: 04/23/2010 Document Revised: 08/27/2015 Document Reviewed: 11/04/2013 Elsevier Interactive Patient Education  2018 [employer]  

## 2017-08-15 ENCOUNTER — Ambulatory Visit: Payer: Self-pay | Admitting: Family Medicine

## 2017-08-30 ENCOUNTER — Telehealth: Payer: Self-pay | Admitting: Family Medicine

## 2017-08-30 NOTE — Telephone Encounter (Signed)
Patient called and requested for a note for hi to go back to work. Patient stated that he attempted to go back to work today, and he was told that he would need a clearance note. Patient was informed he may need to be seen in order to do so. Please fu at your earliest convenience.

## 2017-08-30 NOTE — Telephone Encounter (Signed)
Patient was taken out of work from 5/8-5/13 by angela.  Please follow up.

## 2017-08-31 NOTE — Telephone Encounter (Signed)
Based on my documentation at his last office visit he is unable to return to work.  He has cardiac symptoms with severe shortness of breath for which he was referred to cardiology and I had to increase his Lasix.  Please follow-up with Arna Medici to expedite this referral that was placed currently on this month by Marylene Land.  He also had large left knee effusion for which I had referred him to the ED to have this drained.

## 2017-08-31 NOTE — Telephone Encounter (Signed)
Patient was called and states that he is the only one to provide for his family and needs to go back to work. Patient does not see cardiologist until July, Patient want to know if he can be cleared to work with light duty.

## 2017-09-01 NOTE — Telephone Encounter (Signed)
He needs an office visit with me prior to my clearing him.

## 2017-09-05 NOTE — Telephone Encounter (Signed)
patient has office visit on 09/19/17 at 9:10

## 2017-09-10 ENCOUNTER — Ambulatory Visit (HOSPITAL_BASED_OUTPATIENT_CLINIC_OR_DEPARTMENT_OTHER): Payer: Self-pay | Attending: Physician Assistant

## 2017-09-19 ENCOUNTER — Ambulatory Visit: Payer: Self-pay | Admitting: Family Medicine

## 2017-09-26 ENCOUNTER — Ambulatory Visit: Payer: Self-pay | Admitting: Physician Assistant

## 2017-09-29 ENCOUNTER — Encounter: Payer: Self-pay | Admitting: Cardiology

## 2017-10-03 ENCOUNTER — Ambulatory Visit: Payer: Self-pay | Admitting: Family Medicine

## 2017-10-09 ENCOUNTER — Ambulatory Visit: Payer: Self-pay | Admitting: Cardiology

## 2017-11-20 ENCOUNTER — Encounter (HOSPITAL_COMMUNITY): Payer: Self-pay

## 2017-11-20 ENCOUNTER — Emergency Department (HOSPITAL_COMMUNITY)
Admission: EM | Admit: 2017-11-20 | Discharge: 2017-11-20 | Disposition: A | Discharge status: Home / Self Care | Payer: Self-pay | Attending: Emergency Medicine | Admitting: Emergency Medicine

## 2017-11-20 ENCOUNTER — Emergency Department (HOSPITAL_COMMUNITY): Payer: Self-pay

## 2017-11-20 DIAGNOSIS — I5030 Unspecified diastolic (congestive) heart failure: Secondary | ICD-10-CM | POA: Insufficient documentation

## 2017-11-20 DIAGNOSIS — L03116 Cellulitis of left lower limb: Secondary | ICD-10-CM | POA: Insufficient documentation

## 2017-11-20 DIAGNOSIS — Z7982 Long term (current) use of aspirin: Secondary | ICD-10-CM | POA: Insufficient documentation

## 2017-11-20 DIAGNOSIS — L0291 Cutaneous abscess, unspecified: Secondary | ICD-10-CM

## 2017-11-20 DIAGNOSIS — R52 Pain, unspecified: Secondary | ICD-10-CM

## 2017-11-20 DIAGNOSIS — I110 Hypertensive heart disease with heart failure: Secondary | ICD-10-CM | POA: Insufficient documentation

## 2017-11-20 DIAGNOSIS — Z87891 Personal history of nicotine dependence: Secondary | ICD-10-CM | POA: Insufficient documentation

## 2017-11-20 DIAGNOSIS — Z79899 Other long term (current) drug therapy: Secondary | ICD-10-CM | POA: Insufficient documentation

## 2017-11-20 LAB — CBC WITH DIFFERENTIAL/PLATELET
Abs Immature Granulocytes: 0 10*3/uL (ref 0.0–0.1)
Basophils Absolute: 0 10*3/uL (ref 0.0–0.1)
Basophils Relative: 1 %
Eosinophils Absolute: 0.1 10*3/uL (ref 0.0–0.7)
Eosinophils Relative: 1 %
HCT: 39.6 % (ref 39.0–52.0)
Hemoglobin: 12.6 g/dL — ABNORMAL LOW (ref 13.0–17.0)
IMMATURE GRANULOCYTES: 0 %
Lymphocytes Relative: 30 %
Lymphs Abs: 1.8 10*3/uL (ref 0.7–4.0)
MCH: 23.2 pg — ABNORMAL LOW (ref 26.0–34.0)
MCHC: 31.8 g/dL (ref 30.0–36.0)
MCV: 72.8 fL — AB (ref 78.0–100.0)
MONOS PCT: 10 %
Monocytes Absolute: 0.6 10*3/uL (ref 0.1–1.0)
NEUTROS PCT: 58 %
Neutro Abs: 3.5 10*3/uL (ref 1.7–7.7)
Platelets: 167 10*3/uL (ref 150–400)
RBC: 5.44 MIL/uL (ref 4.22–5.81)
RDW: 14.5 % (ref 11.5–15.5)
WBC: 6 10*3/uL (ref 4.0–10.5)

## 2017-11-20 LAB — COMPREHENSIVE METABOLIC PANEL
ALT: 17 U/L (ref 0–44)
AST: 20 U/L (ref 15–41)
Albumin: 3.6 g/dL (ref 3.5–5.0)
Alkaline Phosphatase: 58 U/L (ref 38–126)
Anion gap: 9 (ref 5–15)
BILIRUBIN TOTAL: 0.8 mg/dL (ref 0.3–1.2)
BUN: 11 mg/dL (ref 6–20)
CALCIUM: 9 mg/dL (ref 8.9–10.3)
CO2: 24 mmol/L (ref 22–32)
CREATININE: 1.14 mg/dL (ref 0.61–1.24)
Chloride: 106 mmol/L (ref 98–111)
Glucose, Bld: 104 mg/dL — ABNORMAL HIGH (ref 70–99)
Potassium: 3.7 mmol/L (ref 3.5–5.1)
Sodium: 139 mmol/L (ref 135–145)
Total Protein: 6.9 g/dL (ref 6.5–8.1)

## 2017-11-20 LAB — URINALYSIS, ROUTINE W REFLEX MICROSCOPIC
Bacteria, UA: NONE SEEN
Bilirubin Urine: NEGATIVE
GLUCOSE, UA: NEGATIVE mg/dL
Hgb urine dipstick: NEGATIVE
KETONES UR: NEGATIVE mg/dL
LEUKOCYTES UA: NEGATIVE
Nitrite: NEGATIVE
Protein, ur: 100 mg/dL — AB
Specific Gravity, Urine: 1.013 (ref 1.005–1.030)
pH: 6 (ref 5.0–8.0)

## 2017-11-20 MED ORDER — DOXYCYCLINE HYCLATE 100 MG PO CAPS: 100.0000 mg | ORAL_CAPSULE | Freq: Two times a day (BID) | ORAL | 0 refills | Status: DC

## 2017-11-20 MED ORDER — FENTANYL CITRATE (PF) 100 MCG/2ML IJ SOLN
50.0000 ug | Freq: Once | INTRAMUSCULAR | Status: AC
  Administered 2017-11-20: 50 ug via INTRAMUSCULAR
  Filled 2017-11-20: qty 2

## 2017-11-20 MED ORDER — DOXYCYCLINE HYCLATE 100 MG PO CAPS: 100.0000 mg | ORAL_CAPSULE | Freq: Two times a day (BID) | ORAL | 0 refills | Status: AC

## 2017-11-20 MED ORDER — LIDOCAINE HCL (PF) 1 % IJ SOLN
10.0000 mL | Freq: Once | INTRAMUSCULAR | Status: AC
  Administered 2017-11-20: 10 mL via INTRADERMAL
  Filled 2017-11-20: qty 10

## 2017-11-20 MED ORDER — DOXYCYCLINE HYCLATE 100 MG PO TABS
100.0000 mg | ORAL_TABLET | Freq: Once | ORAL | Status: AC
  Administered 2017-11-20: 100 mg via ORAL
  Filled 2017-11-20: qty 1
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
## 2017-11-20 NOTE — ED Provider Notes (Signed)
Patient placed in Quick Look pathway, seen and evaluated   Chief Complaint: Wound, leg swelling  HPI:   57 year old male presents with progressively worsening pain, redness, swelling after a box dropped on his leg on Thursday. He is unsure of fevers. No hx of diabetes.  ROS: +wound, leg swelling  Physical Exam:   Gen: No distress  Neuro: Awake and Alert  Skin: Warm    Focused Exam: Left leg: 2+ pitting edema from the foot to the knee. Nickel sized wound over the medial calf which appear macerated and possible purulent drainage. Multiple old, healed wounds over the shin. Diffuse erythema surrounding the wound. Onychomycosis of the toenails.     Right leg: 1+ pitting edema of the leg with multiple healed wounds over anterior shin  Initiation of care has begun. The patient has been counseled on the process, plan, and necessity for staying for the completion/evaluation, and the remainder of the medical screening examination    Bethel BornGekas, Preesha Benjamin Marie, PA-C 11/20/17 1341    Gwyneth SproutPlunkett, Whitney, MD 11/20/17 2101

## 2017-11-20 NOTE — Discharge Instructions (Signed)
You can take Tylenol or Ibuprofen as directed for pain. You can alternate Tylenol and Ibuprofen every 4 hours. If you take Tylenol at 1pm, then you can take Ibuprofen at 5pm. Then you can take Tylenol again at 9pm.   Take antibiotics as directed. Please take all of your antibiotics until finished.  Keep the wound clean and dry.   As we discussed, we have scheduled you for an Ultrasound tomorrow morning. Please come to the main hospital to have the ultrasound performed.   Follow-up with your primary care doctor in the next 24-48 hours.  Return the emergency department for fevers, worsening pain, worsening redness or swelling of the leg, chest pain, difficulty breathing or any other worsening or concerning symptoms.

## 2017-11-20 NOTE — ED Triage Notes (Signed)
Pt presents for evaluation of L lower leg pain. States Thursday a box fell and hit leg. States initially had bruise that became a blister and popped. Pt has redness and swelling to lower leg.

## 2017-11-20 NOTE — ED Provider Notes (Signed)
Medical screening examination/treatment/procedure(s) were conducted as a shared visit with non-physician practitioner(s) and myself.  I personally evaluated the patient during the encounter.  Clinical Impression:   Final diagnoses:  Cellulitis of left lower extremity  Abscess   Presents with wound to the LLE with drainage - purulence and US at bedside shows some abscess - I and D performed successfully - pt palced on abx - stable for d/c - US in the AM.       Eber HongMiller, Durant Scibilia, MD 11/22/17 1043

## 2017-11-21 ENCOUNTER — Ambulatory Visit (HOSPITAL_COMMUNITY)
Admission: RE | Admit: 2017-11-21 | Discharge: 2017-11-21 | Disposition: A | Discharge status: Home / Self Care | Payer: Self-pay | Source: Ambulatory Visit | Attending: Emergency Medicine | Admitting: Emergency Medicine

## 2017-11-21 DIAGNOSIS — R600 Localized edema: Secondary | ICD-10-CM | POA: Insufficient documentation

## 2017-11-21 DIAGNOSIS — R609 Edema, unspecified: Secondary | ICD-10-CM

## 2017-11-21 DIAGNOSIS — R590 Localized enlarged lymph nodes: Secondary | ICD-10-CM | POA: Insufficient documentation

## 2017-11-21 NOTE — Progress Notes (Signed)
Left lower extremity venous duplex has been completed. Negative for DVT.  11/21/17 9:18 AM Olen CordialGreg Shemiah Rosch RVT

## 2017-12-08 ENCOUNTER — Other Ambulatory Visit: Payer: Self-pay | Admitting: Family Medicine

## 2017-12-08 ENCOUNTER — Other Ambulatory Visit: Payer: Self-pay | Admitting: Physician Assistant

## 2017-12-08 DIAGNOSIS — I5031 Acute diastolic (congestive) heart failure: Secondary | ICD-10-CM

## 2017-12-08 MED ORDER — FUROSEMIDE 40 MG PO TABS: 80.0000 mg | ORAL_TABLET | Freq: Two times a day (BID) | ORAL | 1 refills | Status: DC

## 2017-12-08 MED ORDER — POTASSIUM CHLORIDE ER 10 MEQ PO TBCR: 10.0000 meq | EXTENDED_RELEASE_TABLET | Freq: Two times a day (BID) | ORAL | 0 refills | Status: DC

## 2017-12-12 ENCOUNTER — Emergency Department (HOSPITAL_COMMUNITY)
Admission: EM | Admit: 2017-12-12 | Discharge: 2017-12-12 | Disposition: A | Discharge status: Home / Self Care | Payer: Self-pay | Attending: Emergency Medicine | Admitting: Emergency Medicine

## 2017-12-12 ENCOUNTER — Other Ambulatory Visit: Payer: Self-pay

## 2017-12-12 ENCOUNTER — Emergency Department (HOSPITAL_COMMUNITY): Payer: Self-pay

## 2017-12-12 ENCOUNTER — Encounter (HOSPITAL_COMMUNITY): Payer: Self-pay | Admitting: *Deleted

## 2017-12-12 DIAGNOSIS — I110 Hypertensive heart disease with heart failure: Secondary | ICD-10-CM | POA: Insufficient documentation

## 2017-12-12 DIAGNOSIS — Z7982 Long term (current) use of aspirin: Secondary | ICD-10-CM | POA: Insufficient documentation

## 2017-12-12 DIAGNOSIS — R059 Cough, unspecified: Secondary | ICD-10-CM

## 2017-12-12 DIAGNOSIS — R0789 Other chest pain: Secondary | ICD-10-CM | POA: Insufficient documentation

## 2017-12-12 DIAGNOSIS — R0602 Shortness of breath: Secondary | ICD-10-CM | POA: Insufficient documentation

## 2017-12-12 DIAGNOSIS — I5030 Unspecified diastolic (congestive) heart failure: Secondary | ICD-10-CM | POA: Insufficient documentation

## 2017-12-12 DIAGNOSIS — R05 Cough: Secondary | ICD-10-CM | POA: Insufficient documentation

## 2017-12-12 DIAGNOSIS — Z91040 Latex allergy status: Secondary | ICD-10-CM | POA: Insufficient documentation

## 2017-12-12 DIAGNOSIS — I5031 Acute diastolic (congestive) heart failure: Secondary | ICD-10-CM

## 2017-12-12 DIAGNOSIS — Z87891 Personal history of nicotine dependence: Secondary | ICD-10-CM | POA: Insufficient documentation

## 2017-12-12 DIAGNOSIS — Z79899 Other long term (current) drug therapy: Secondary | ICD-10-CM | POA: Insufficient documentation

## 2017-12-12 LAB — CBC
HEMATOCRIT: 39 % (ref 39.0–52.0)
HEMOGLOBIN: 12.4 g/dL — AB (ref 13.0–17.0)
MCH: 23.3 pg — ABNORMAL LOW (ref 26.0–34.0)
MCHC: 31.8 g/dL (ref 30.0–36.0)
MCV: 73.2 fL — ABNORMAL LOW (ref 78.0–100.0)
Platelets: 132 10*3/uL — ABNORMAL LOW (ref 150–400)
RBC: 5.33 MIL/uL (ref 4.22–5.81)
RDW: 14.1 % (ref 11.5–15.5)
WBC: 4.5 10*3/uL (ref 4.0–10.5)

## 2017-12-12 LAB — BASIC METABOLIC PANEL
ANION GAP: 7 (ref 5–15)
BUN: 19 mg/dL (ref 6–20)
CO2: 24 mmol/L (ref 22–32)
Calcium: 9.4 mg/dL (ref 8.9–10.3)
Chloride: 108 mmol/L (ref 98–111)
Creatinine, Ser: 1.21 mg/dL (ref 0.61–1.24)
GFR calc Af Amer: >60 mL/min (ref >60)
Glucose, Bld: 112 mg/dL — ABNORMAL HIGH (ref 70–99)
POTASSIUM: 3.6 mmol/L (ref 3.5–5.1)
SODIUM: 139 mmol/L (ref 135–145)

## 2017-12-12 LAB — I-STAT TROPONIN, ED
Troponin i, poc: 0.04 ng/mL (ref 0.00–0.08)
Troponin i, poc: 0.05 ng/mL (ref 0.00–0.08)

## 2017-12-12 LAB — D-DIMER, QUANTITATIVE: D-Dimer, Quant: 0.85 ug/mL-FEU — ABNORMAL HIGH (ref 0.00–0.50)

## 2017-12-12 LAB — BRAIN NATRIURETIC PEPTIDE: B NATRIURETIC PEPTIDE 5: 343.8 pg/mL — AB (ref 0.0–100.0)

## 2017-12-12 MED ORDER — BENZONATATE 100 MG PO CAPS: 100.0000 mg | ORAL_CAPSULE | Freq: Three times a day (TID) | ORAL | 0 refills | Status: DC

## 2017-12-12 MED ORDER — CLONIDINE HCL 0.2 MG PO TABS: 0.2000 mg | ORAL_TABLET | Freq: Two times a day (BID) | ORAL | 0 refills | Status: DC

## 2017-12-12 MED ORDER — POTASSIUM CHLORIDE CRYS ER 20 MEQ PO TBCR
20.0000 meq | EXTENDED_RELEASE_TABLET | Freq: Once | ORAL | Status: AC
  Administered 2017-12-12: 20 meq via ORAL
  Filled 2017-12-12: qty 1

## 2017-12-12 MED ORDER — IOPAMIDOL (ISOVUE-370) INJECTION 76%
100.0000 mL | Freq: Once | INTRAVENOUS | Status: AC | PRN
  Administered 2017-12-12: 100 mL via INTRAVENOUS

## 2017-12-12 MED ORDER — FUROSEMIDE 40 MG PO TABS: 80.0000 mg | ORAL_TABLET | Freq: Two times a day (BID) | ORAL | 0 refills | Status: DC

## 2017-12-12 MED ORDER — POTASSIUM CHLORIDE ER 10 MEQ PO TBCR: 10.0000 meq | EXTENDED_RELEASE_TABLET | Freq: Two times a day (BID) | ORAL | 0 refills | Status: DC

## 2017-12-12 MED ORDER — ACETAMINOPHEN 325 MG PO TABS
650.0000 mg | ORAL_TABLET | Freq: Once | ORAL | Status: AC
  Administered 2017-12-12: 650 mg via ORAL
  Filled 2017-12-12: qty 2

## 2017-12-12 MED ORDER — LISINOPRIL 20 MG PO TABS: 20.0000 mg | ORAL_TABLET | Freq: Every day | ORAL | 0 refills | Status: DC

## 2017-12-12 MED ORDER — CARVEDILOL 3.125 MG PO TABS: 3.1250 mg | ORAL_TABLET | Freq: Two times a day (BID) | ORAL | 0 refills | Status: DC

## 2017-12-12 MED ORDER — AMLODIPINE BESYLATE 10 MG PO TABS: 10.0000 mg | ORAL_TABLET | Freq: Every day | ORAL | 0 refills | Status: DC

## 2017-12-12 NOTE — ED Notes (Signed)
ED Provider at bedside. 

## 2017-12-12 NOTE — ED Triage Notes (Signed)
Pt in c/o chest pain and shortness of breath that started yesterday, had episode a few days ago that resolved on its own but started back so he came in for evaluation, reports nausea at times as well, no distress noted

## 2017-12-12 NOTE — ED Notes (Signed)
Pt ambulated in hall without any complaint of pain. Pt complains of slight dizziness upon standing. O2 stayed above 97 the whole time.
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
## 2017-12-12 NOTE — ED Notes (Signed)
Patient verbalizes understanding of discharge instructions. Opportunity for questioning and answers were provided. Armband removed by staff, pt discharged from ED ambulatory.   

## 2018-06-05 ENCOUNTER — Emergency Department (HOSPITAL_BASED_OUTPATIENT_CLINIC_OR_DEPARTMENT_OTHER): Payer: PRIVATE HEALTH INSURANCE

## 2018-06-05 ENCOUNTER — Emergency Department (HOSPITAL_COMMUNITY): Payer: PRIVATE HEALTH INSURANCE

## 2018-06-05 ENCOUNTER — Encounter (HOSPITAL_COMMUNITY): Payer: Self-pay

## 2018-06-05 ENCOUNTER — Other Ambulatory Visit: Payer: Self-pay

## 2018-06-05 ENCOUNTER — Observation Stay (HOSPITAL_COMMUNITY)
Admission: EM | Admit: 2018-06-05 | Discharge: 2018-06-08 | Disposition: A | Discharge status: Home / Self Care | Payer: PRIVATE HEALTH INSURANCE | Attending: Internal Medicine | Admitting: Internal Medicine

## 2018-06-05 DIAGNOSIS — Z7982 Long term (current) use of aspirin: Secondary | ICD-10-CM | POA: Diagnosis not present

## 2018-06-05 DIAGNOSIS — Z8249 Family history of ischemic heart disease and other diseases of the circulatory system: Secondary | ICD-10-CM | POA: Insufficient documentation

## 2018-06-05 DIAGNOSIS — I2510 Atherosclerotic heart disease of native coronary artery without angina pectoris: Secondary | ICD-10-CM | POA: Insufficient documentation

## 2018-06-05 DIAGNOSIS — I352 Nonrheumatic aortic (valve) stenosis with insufficiency: Secondary | ICD-10-CM | POA: Insufficient documentation

## 2018-06-05 DIAGNOSIS — G4730 Sleep apnea, unspecified: Secondary | ICD-10-CM | POA: Insufficient documentation

## 2018-06-05 DIAGNOSIS — I110 Hypertensive heart disease with heart failure: Secondary | ICD-10-CM | POA: Insufficient documentation

## 2018-06-05 DIAGNOSIS — M109 Gout, unspecified: Secondary | ICD-10-CM | POA: Insufficient documentation

## 2018-06-05 DIAGNOSIS — E7800 Pure hypercholesterolemia, unspecified: Secondary | ICD-10-CM | POA: Insufficient documentation

## 2018-06-05 DIAGNOSIS — G8929 Other chronic pain: Secondary | ICD-10-CM | POA: Diagnosis not present

## 2018-06-05 DIAGNOSIS — Z79899 Other long term (current) drug therapy: Secondary | ICD-10-CM | POA: Diagnosis not present

## 2018-06-05 DIAGNOSIS — R0602 Shortness of breath: Secondary | ICD-10-CM | POA: Diagnosis present

## 2018-06-05 DIAGNOSIS — Z87891 Personal history of nicotine dependence: Secondary | ICD-10-CM | POA: Insufficient documentation

## 2018-06-05 DIAGNOSIS — R05 Cough: Secondary | ICD-10-CM

## 2018-06-05 DIAGNOSIS — J069 Acute upper respiratory infection, unspecified: Secondary | ICD-10-CM | POA: Insufficient documentation

## 2018-06-05 DIAGNOSIS — E785 Hyperlipidemia, unspecified: Secondary | ICD-10-CM | POA: Insufficient documentation

## 2018-06-05 DIAGNOSIS — Z9119 Patient's noncompliance with other medical treatment and regimen: Secondary | ICD-10-CM | POA: Diagnosis not present

## 2018-06-05 DIAGNOSIS — M549 Dorsalgia, unspecified: Secondary | ICD-10-CM | POA: Diagnosis not present

## 2018-06-05 DIAGNOSIS — E876 Hypokalemia: Secondary | ICD-10-CM | POA: Diagnosis not present

## 2018-06-05 DIAGNOSIS — I160 Hypertensive urgency: Principal | ICD-10-CM | POA: Insufficient documentation

## 2018-06-05 DIAGNOSIS — R52 Pain, unspecified: Secondary | ICD-10-CM

## 2018-06-05 DIAGNOSIS — N489 Disorder of penis, unspecified: Secondary | ICD-10-CM

## 2018-06-05 DIAGNOSIS — I5033 Acute on chronic diastolic (congestive) heart failure: Secondary | ICD-10-CM | POA: Diagnosis not present

## 2018-06-05 DIAGNOSIS — M1990 Unspecified osteoarthritis, unspecified site: Secondary | ICD-10-CM | POA: Diagnosis not present

## 2018-06-05 DIAGNOSIS — M7989 Other specified soft tissue disorders: Secondary | ICD-10-CM

## 2018-06-05 DIAGNOSIS — K219 Gastro-esophageal reflux disease without esophagitis: Secondary | ICD-10-CM | POA: Diagnosis not present

## 2018-06-05 DIAGNOSIS — R059 Cough, unspecified: Secondary | ICD-10-CM

## 2018-06-05 DIAGNOSIS — Z91199 Patient's noncompliance with other medical treatment and regimen due to unspecified reason: Secondary | ICD-10-CM

## 2018-06-05 LAB — I-STAT TROPONIN, ED
Troponin i, poc: 0.05 ng/mL (ref 0.00–0.08)
Troponin i, poc: 0.05 ng/mL (ref 0.00–0.08)

## 2018-06-05 LAB — CBC
HCT: 42.4 % (ref 39.0–52.0)
Hemoglobin: 13.3 g/dL (ref 13.0–17.0)
MCH: 23.1 pg — ABNORMAL LOW (ref 26.0–34.0)
MCHC: 31.4 g/dL (ref 30.0–36.0)
MCV: 73.6 fL — ABNORMAL LOW (ref 80.0–100.0)
Platelets: 137 10*3/uL — ABNORMAL LOW (ref 150–400)
RBC: 5.76 MIL/uL (ref 4.22–5.81)
RDW: 14.3 % (ref 11.5–15.5)
WBC: 5.3 10*3/uL (ref 4.0–10.5)
nRBC: 0 % (ref 0.0–0.2)

## 2018-06-05 LAB — BASIC METABOLIC PANEL
Anion gap: 9 (ref 5–15)
BUN: 16 mg/dL (ref 6–20)
CO2: 24 mmol/L (ref 22–32)
Calcium: 9.3 mg/dL (ref 8.9–10.3)
Chloride: 107 mmol/L (ref 98–111)
Creatinine, Ser: 1.3 mg/dL — ABNORMAL HIGH (ref 0.61–1.24)
GFR calc Af Amer: >60 mL/min (ref >60)
GFR calc non Af Amer: >60 mL/min (ref >60)
Glucose, Bld: 121 mg/dL — ABNORMAL HIGH (ref 70–99)
Potassium: 3.5 mmol/L (ref 3.5–5.1)
SODIUM: 140 mmol/L (ref 135–145)

## 2018-06-05 LAB — BRAIN NATRIURETIC PEPTIDE: B Natriuretic Peptide: 345 pg/mL — ABNORMAL HIGH (ref 0.0–100.0)

## 2018-06-05 MED ORDER — AMLODIPINE BESYLATE 5 MG PO TABS
10.0000 mg | ORAL_TABLET | Freq: Once | ORAL | Status: AC
  Administered 2018-06-05: 10 mg via ORAL
  Filled 2018-06-05: qty 2

## 2018-06-05 MED ORDER — SODIUM CHLORIDE 0.9% FLUSH
3.0000 mL | Freq: Two times a day (BID) | INTRAVENOUS | Status: DC
  Administered 2018-06-06 – 2018-06-07 (×4): 3 mL via INTRAVENOUS

## 2018-06-05 MED ORDER — SODIUM CHLORIDE 0.9% FLUSH: 3.0000 mL | INTRAVENOUS | Status: DC | PRN

## 2018-06-05 MED ORDER — FUROSEMIDE 80 MG PO TABS
80.0000 mg | ORAL_TABLET | Freq: Two times a day (BID) | ORAL | Status: DC
  Administered 2018-06-05 – 2018-06-07 (×4): 80 mg via ORAL
  Filled 2018-06-05 (×4): qty 1

## 2018-06-05 MED ORDER — LISINOPRIL 20 MG PO TABS
20.0000 mg | ORAL_TABLET | Freq: Once | ORAL | Status: AC
  Administered 2018-06-05: 20 mg via ORAL
  Filled 2018-06-05: qty 1

## 2018-06-05 MED ORDER — CARVEDILOL 3.125 MG PO TABS
3.1250 mg | ORAL_TABLET | Freq: Two times a day (BID) | ORAL | Status: DC
  Administered 2018-06-06 – 2018-06-08 (×5): 3.125 mg via ORAL
  Filled 2018-06-05 (×5): qty 1

## 2018-06-05 MED ORDER — ALBUTEROL SULFATE (2.5 MG/3ML) 0.083% IN NEBU: 2.5000 mg | INHALATION_SOLUTION | RESPIRATORY_TRACT | Status: DC | PRN

## 2018-06-05 MED ORDER — ALBUTEROL SULFATE (2.5 MG/3ML) 0.083% IN NEBU
2.5000 mg | INHALATION_SOLUTION | Freq: Four times a day (QID) | RESPIRATORY_TRACT | Status: DC
  Administered 2018-06-05: 2.5 mg via RESPIRATORY_TRACT
  Filled 2018-06-05: qty 3

## 2018-06-05 MED ORDER — FUROSEMIDE 80 MG PO TABS: 80.0000 mg | ORAL_TABLET | Freq: Two times a day (BID) | ORAL | Status: DC

## 2018-06-05 MED ORDER — PANTOPRAZOLE SODIUM 40 MG PO TBEC
40.0000 mg | DELAYED_RELEASE_TABLET | Freq: Every day | ORAL | Status: DC
  Administered 2018-06-05 – 2018-06-08 (×4): 40 mg via ORAL
  Filled 2018-06-05 (×5): qty 1

## 2018-06-05 MED ORDER — ALBUTEROL SULFATE (2.5 MG/3ML) 0.083% IN NEBU
2.5000 mg | INHALATION_SOLUTION | Freq: Two times a day (BID) | RESPIRATORY_TRACT | Status: DC
  Administered 2018-06-06: 2.5 mg via RESPIRATORY_TRACT
  Filled 2018-06-05: qty 3

## 2018-06-05 MED ORDER — CLONIDINE HCL 0.2 MG PO TABS
0.2000 mg | ORAL_TABLET | Freq: Once | ORAL | Status: AC
  Administered 2018-06-05: 0.2 mg via ORAL
  Filled 2018-06-05: qty 1

## 2018-06-05 MED ORDER — AMLODIPINE BESYLATE 10 MG PO TABS
10.0000 mg | ORAL_TABLET | Freq: Every day | ORAL | Status: DC
  Administered 2018-06-05 – 2018-06-08 (×4): 10 mg via ORAL
  Filled 2018-06-05 (×3): qty 1
  Filled 2018-06-05: qty 2

## 2018-06-05 MED ORDER — HYDRALAZINE HCL 20 MG/ML IJ SOLN
10.0000 mg | INTRAMUSCULAR | Status: DC | PRN
  Administered 2018-06-05 – 2018-06-06 (×2): 10 mg via INTRAVENOUS
  Filled 2018-06-05 (×3): qty 1

## 2018-06-05 MED ORDER — CLONIDINE HCL 0.2 MG PO TABS
0.2000 mg | ORAL_TABLET | Freq: Three times a day (TID) | ORAL | Status: DC
  Administered 2018-06-05 – 2018-06-08 (×8): 0.2 mg via ORAL
  Filled 2018-06-05 (×8): qty 1

## 2018-06-05 MED ORDER — ACETAMINOPHEN 650 MG RE SUPP: 650.0000 mg | Freq: Four times a day (QID) | RECTAL | Status: DC | PRN

## 2018-06-05 MED ORDER — ENOXAPARIN SODIUM 40 MG/0.4ML ~~LOC~~ SOLN
40.0000 mg | SUBCUTANEOUS | Status: DC
  Administered 2018-06-05 – 2018-06-07 (×3): 40 mg via SUBCUTANEOUS
  Filled 2018-06-05 (×3): qty 0.4

## 2018-06-05 MED ORDER — IOPAMIDOL (ISOVUE-370) INJECTION 76%
INTRAVENOUS | Status: AC
  Administered 2018-06-05: 17:00:00
  Filled 2018-06-05: qty 100

## 2018-06-05 MED ORDER — ACETAMINOPHEN 325 MG PO TABS: 650.0000 mg | ORAL_TABLET | Freq: Four times a day (QID) | ORAL | Status: DC | PRN

## 2018-06-05 MED ORDER — SODIUM CHLORIDE 0.9 % IV SOLN: 250.0000 mL | INTRAVENOUS | Status: DC | PRN

## 2018-06-05 MED ORDER — ZOLPIDEM TARTRATE 5 MG PO TABS: 5.0000 mg | ORAL_TABLET | Freq: Every evening | ORAL | Status: DC | PRN

## 2018-06-05 MED ORDER — ACETAMINOPHEN 325 MG PO TABS
650.0000 mg | ORAL_TABLET | Freq: Four times a day (QID) | ORAL | Status: DC | PRN
  Administered 2018-06-06: 650 mg via ORAL
  Filled 2018-06-05: qty 2

## 2018-06-05 MED ORDER — SODIUM CHLORIDE 0.9% FLUSH: 3.0000 mL | Freq: Once | INTRAVENOUS | Status: DC

## 2018-06-05 MED ORDER — LISINOPRIL 20 MG PO TABS
20.0000 mg | ORAL_TABLET | Freq: Every day | ORAL | Status: DC
  Administered 2018-06-05 – 2018-06-08 (×4): 20 mg via ORAL
  Filled 2018-06-05 (×4): qty 1

## 2018-06-05 MED ORDER — ASPIRIN 81 MG PO CHEW
81.0000 mg | CHEWABLE_TABLET | Freq: Every day | ORAL | Status: DC
  Administered 2018-06-05 – 2018-06-08 (×4): 81 mg via ORAL
  Filled 2018-06-05 (×4): qty 1

## 2018-06-05 MED ORDER — BENZONATATE 100 MG PO CAPS
100.0000 mg | ORAL_CAPSULE | Freq: Three times a day (TID) | ORAL | Status: DC
  Administered 2018-06-05 – 2018-06-08 (×9): 100 mg via ORAL
  Filled 2018-06-05 (×9): qty 1
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
## 2018-06-05 NOTE — ED Notes (Signed)
Patient transported to vascular. 

## 2018-06-05 NOTE — ED Provider Notes (Signed)
  Physical Exam  BP (!) 187/137 (BP Location: Left Arm)   Pulse 79   Temp 98.6 F (37 C) (Oral)   Resp 17   Ht 5\' 9"  (1.753 m)   Wt 81.6 kg   SpO2 98%   BMI 26.58 kg/m   Physical Exam Vitals signs and nursing note reviewed.  Constitutional:      Appearance: Normal appearance.  HENT:     Head: Normocephalic.  Eyes:     Conjunctiva/sclera: Conjunctivae normal.  Pulmonary:     Effort: Pulmonary effort is normal.  Skin:    General: Skin is dry.  Neurological:     Mental Status: He is alert.  Psychiatric:        Mood and Affect: Mood normal.     ED Course/Procedures   Clinical Course as of Jun 04 2144  Tue Jun 05, 2018  1303 WBCs are within normal limits.  WBC: 5.3 [AH]  1304 Mild changes of acute bronchitis and/or asthma without focal airspace pneumonia. Stable cardiomegaly without evidence of pulmonary edema.    DG Chest 2 View [AH]  1453 No evidence of DVT noted on ultrasound.  VAS [place] LOWER EXTREMITY VENOUS (DVT) (ONLY [facility] & [facility] 7a-7p) [AH]  1537 Patient continues to endorse shortness of breath. Will order CTA.    [AH]    Clinical Course User Index [AH] Leretha Dykes, PA-C    Procedures  MDM  Patient passed to me on shift handoff. Patient sleeping comfortably on stretcher at this time. Pending CTA. Patient continues to be hypertensive. This is not new and he has long standing issues with uncontrolled BP, on three antihypertensives which he is not compliant with. He was given a dose of all of his home medications and BP improved some.   Patient work-up essentially negative and patient resting on stretcher.  However, his blood pressure remains 181/120 3:07 different blood pressure medications.  I consult to the hospitalist for admission for hypertensive urgency.     Jeral Pinch 06/05/18 2146    Virgina Norfolk, DO 06/06/18 0119

## 2018-06-05 NOTE — ED Notes (Signed)
Delay in blood work and vital signs due to patient still at vascular ultrasound.

## 2018-06-05 NOTE — ED Triage Notes (Signed)
Pt reports URI symptoms for several days. Hx of HTN but has been out of meds. Pt alert and oriented but reports dizziness and headache. BP 224/150 in triage.

## 2018-06-05 NOTE — Progress Notes (Signed)
Left lower extremity venous duplex completed. Refer to "CV Proc" under chart review to view preliminary results.  06/05/2018 2:48 PM Gertie Fey, MHA, RVT, RDCS, RDMS
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
## 2018-06-06 DIAGNOSIS — N489 Disorder of penis, unspecified: Secondary | ICD-10-CM

## 2018-06-06 LAB — RESPIRATORY PANEL BY PCR

## 2018-06-06 LAB — BASIC METABOLIC PANEL
Anion gap: 10 (ref 5–15)
BUN: 15 mg/dL (ref 6–20)
CHLORIDE: 104 mmol/L (ref 98–111)
CO2: 27 mmol/L (ref 22–32)
Calcium: 9 mg/dL (ref 8.9–10.3)
Creatinine, Ser: 1.35 mg/dL — ABNORMAL HIGH (ref 0.61–1.24)
GFR calc non Af Amer: 57 mL/min — ABNORMAL LOW (ref >60)
Glucose, Bld: 99 mg/dL (ref 70–99)
Potassium: 3.1 mmol/L — ABNORMAL LOW (ref 3.5–5.1)
Sodium: 141 mmol/L (ref 135–145)

## 2018-06-06 LAB — RAPID URINE DRUG SCREEN, HOSP PERFORMED
AMPHETAMINES: NOT DETECTED
BENZODIAZEPINES: NOT DETECTED
Barbiturates: NOT DETECTED
Cocaine: NOT DETECTED
Opiates: NOT DETECTED
Tetrahydrocannabinol: NOT DETECTED

## 2018-06-06 LAB — GLUCOSE, CAPILLARY: Glucose-Capillary: 110 mg/dL — ABNORMAL HIGH (ref 70–99)

## 2018-06-06 MED ORDER — MAGNESIUM SULFATE 2 GM/50ML IV SOLN
2.0000 g | Freq: Once | INTRAVENOUS | Status: AC
  Administered 2018-06-06: 2 g via INTRAVENOUS
  Filled 2018-06-06: qty 50

## 2018-06-06 MED ORDER — POTASSIUM CHLORIDE CRYS ER 20 MEQ PO TBCR
40.0000 meq | EXTENDED_RELEASE_TABLET | Freq: Once | ORAL | Status: AC
  Administered 2018-06-06: 40 meq via ORAL
  Filled 2018-06-06: qty 2

## 2018-06-06 MED ORDER — TRAMADOL HCL 50 MG PO TABS
50.0000 mg | ORAL_TABLET | Freq: Once | ORAL | Status: AC
  Administered 2018-06-06: 50 mg via ORAL
  Filled 2018-06-06: qty 1

## 2018-06-06 MED ORDER — POTASSIUM CHLORIDE CRYS ER 20 MEQ PO TBCR
40.0000 meq | EXTENDED_RELEASE_TABLET | Freq: Every day | ORAL | Status: DC
  Administered 2018-06-06 – 2018-06-08 (×3): 40 meq via ORAL
  Filled 2018-06-06 (×3): qty 2

## 2018-06-06 NOTE — Progress Notes (Signed)
Patient reports bright yellow urine with odor. No other symptoms reported. Urine assessed via toilet. Urine light yellow with no odor. Will continue to monitor

## 2018-06-06 NOTE — Care Management Note (Signed)
Case Management Note  Patient Details  Name: [name] MRN: [id number] Date of Birth: 04-29-1960  Subjective/Objective:  58 yo male present with URI and HTN. PMH: hypertension , hyperlipidemia and mild diastolic congestive heart failure and noncompliance.             Action/Plan: CM met with patient to discuss needs. Patient lives at home, independent with ADLs. Patient has no established PCP; insurance: [employer]. CM offered assisting patient with establishing a PCP with patient agreeable. Hospital f/u appointment arranged at: [place] on 06/21/18 @ 1330; AVS updated. No further needs from CM.   Expected Discharge Date:                  Expected Discharge Plan:  Home/Self Care  In-House Referral:  NA  Discharge planning Services  CM Consult, Follow-up appt scheduled  Post Acute Care Choice:  NA Choice offered to:  NA  DME Arranged:  N/A DME Agency:  NA  HH Arranged:  NA HH Agency:  NA  Status of Service:  Completed, signed off  If discussed at [place] of Stay Meetings, dates discussed:    Additional Comments:  Midge Minium RN, BSN, NCM-BC, ACM-RN [phone] 06/06/2018, 2:23 PM

## 2018-06-06 NOTE — Progress Notes (Signed)
Progress Note    Shawn Leydenugene Kendall  ZOX:[id number] DOB: Sep 05, 1960  DOA: 06/05/2018 PCP: Patient, No Pcp Per    Brief Narrative:     Medical records reviewed and are as summarized below:  [name] is an 58 y.o. male with past medical history significant for hypertension , hyperlipidemia and mild diastolic congestive heart failure and noncompliance presenting today with 3 weeks history of dry cough, lower extremity swelling.  Patient denies any history of fever chills and congestion.  Assessment/Plan:   Active Problems:   History of noncompliance with medical treatment   Hypertensive urgency   Penile lesion  Penile lesion -painless so ? Syphilis -check RPR -can treat with PCN -also check HIV  Hypertensive urgency due to noncompliance Resume home medications -adjust medications as able  Mild diastolic congestive heart failure Clinically compensated Resume Lasix  Hypokalemia -replete -check Mg in AM  Family Communication/Anticipated D/C date and plan/Code Status   DVT prophylaxis: Lovenox ordered. Code Status: Full Code.  Family Communication:  Disposition Plan:    Medical Consultants:    None.     Subjective:   Worried about lesion on his penis-- appeared 3 days ago  Objective:    Vitals:   06/06/18 0300 06/06/18 0737 06/06/18 0739 06/06/18 1203  BP: (!) 149/108  (!) 154/98 (!) 146/107  Pulse: 65  69 60  Resp: 18  19 17   Temp: 98 F (36.7 C)   98.1 F (36.7 C)  TempSrc: Oral  Oral Oral  SpO2: 96% 100% 99% 97%  Weight:      Height:       No intake or output data in the 24 hours ending 06/06/18 1434 Filed Weights   06/05/18 1258  Weight: 81.6 kg    Exam: In bed, poor eye contact Ulcer on the left shaft of his penis-- painless rrr No wheezing, no increased work of breathing A+Ox3  Data Reviewed:   I have personally reviewed following labs and imaging studies:  Labs: Labs show the following:   Basic Metabolic Panel: Recent Labs    Lab 06/05/18 1013 06/06/18 0552  NA 140 141  K 3.5 3.1*  CL 107 104  CO2 24 27  GLUCOSE 121* 99  BUN 16 15  CREATININE 1.30* 1.35*  CALCIUM 9.3 9.0   GFR Estimated Creatinine Clearance: 59.6 mL/min (A) (by C-G formula based on SCr of 1.35 mg/dL (H)). Liver Function Tests: No results for input(s): AST, ALT, ALKPHOS, BILITOT, PROT, ALBUMIN in the last 168 hours. No results for input(s): LIPASE, AMYLASE in the last 168 hours. No results for input(s): AMMONIA in the last 168 hours. Coagulation profile No results for input(s): INR, PROTIME in the last 168 hours.  CBC: Recent Labs  Lab 06/05/18 1013  WBC 5.3  HGB 13.3  HCT 42.4  MCV 73.6*  PLT 137*   Cardiac Enzymes: No results for input(s): CKTOTAL, CKMB, CKMBINDEX, TROPONINI in the last 168 hours. BNP (last 3 results) No results for input(s): PROBNP in the last 8760 hours. CBG: Recent Labs  Lab 06/06/18 1118  GLUCAP 110*   D-Dimer: No results for input(s): DDIMER in the last 72 hours. Hgb A1c: No results for input(s): HGBA1C in the last 72 hours. Lipid Profile: No results for input(s): CHOL, HDL, LDLCALC, TRIG, CHOLHDL, LDLDIRECT in the last 72 hours. Thyroid function studies: No results for input(s): TSH, T4TOTAL, T3FREE, THYROIDAB in the last 72 hours.  Invalid input(s): FREET3 Anemia work up: No results for input(s): VITAMINB12, FOLATE,  FERRITIN, TIBC, IRON, RETICCTPCT in the last 72 hours. Sepsis Labs: Recent Labs  Lab 06/05/18 1013  WBC 5.3    Microbiology Recent Results (from the past 240 hour(s))  Respiratory Panel by PCR     Status: None   Collection Time: 06/05/18 11:00 PM  Result Value Ref Range Status   Adenovirus NOT DETECTED NOT DETECTED Final   Coronavirus 229E NOT DETECTED NOT DETECTED Final    Comment: (NOTE) The Coronavirus on the Respiratory Panel, DOES NOT test for the novel  Coronavirus (2019 nCoV)    Coronavirus HKU1 NOT DETECTED NOT DETECTED Final   Coronavirus NL63 NOT  DETECTED NOT DETECTED Final   Coronavirus OC43 NOT DETECTED NOT DETECTED Final   Metapneumovirus NOT DETECTED NOT DETECTED Final   Rhinovirus / Enterovirus NOT DETECTED NOT DETECTED Final   Influenza A NOT DETECTED NOT DETECTED Final   Influenza B NOT DETECTED NOT DETECTED Final   Parainfluenza Virus 1 NOT DETECTED NOT DETECTED Final   Parainfluenza Virus 2 NOT DETECTED NOT DETECTED Final   Parainfluenza Virus 3 NOT DETECTED NOT DETECTED Final   Parainfluenza Virus 4 NOT DETECTED NOT DETECTED Final   Respiratory Syncytial Virus NOT DETECTED NOT DETECTED Final   Bordetella pertussis NOT DETECTED NOT DETECTED Final   Chlamydophila pneumoniae NOT DETECTED NOT DETECTED Final   Mycoplasma pneumoniae NOT DETECTED NOT DETECTED Final    Comment: Performed at [facility], [address], [place], [place] [address]    Procedures and diagnostic studies:  Dg Chest 2 View  Result Date: 06/05/2018 CLINICAL DATA:  Three-week history of unspecified UPPER respiratory symptoms, dizziness and headache. Current history of hypertension but the patient has not had his medications recently. EXAM: CHEST - 2 VIEW COMPARISON:  CTA chest 12/12/2017, 07/25/2017. Chest x-rays 12/12/2017 and earlier. FINDINGS: Cardiac silhouette moderately enlarged, unchanged. Thoracic aorta tortuous and minimally atherosclerotic, unchanged. Hilar and mediastinal contours otherwise unremarkable. Mildly prominent bronchovascular markings diffusely and mild central peribronchial thickening, more so than on prior examinations. Lungs otherwise clear. No localized airspace consolidation. No pleural effusions. No pneumothorax. Normal pulmonary vascularity. Visualized bony thorax intact. IMPRESSION: 1. Mild changes of acute bronchitis and/or asthma without focal airspace pneumonia. 2. Stable cardiomegaly without evidence of pulmonary edema. Electronically Signed   By: Hulan Saas M.D.   On: 06/05/2018 10:35   Ct Angio Chest Pe W/cm  &/or Wo Cm  Result Date: 06/05/2018 CLINICAL DATA:  Shortness of breath EXAM: CT ANGIOGRAPHY CHEST WITH CONTRAST TECHNIQUE: Multidetector CT imaging of the chest was performed using the standard protocol during bolus administration of intravenous contrast. Multiplanar CT image reconstructions and MIPs were obtained to evaluate the vascular anatomy. CONTRAST:  <See Chart> ISOVUE-370 IOPAMIDOL (ISOVUE-370) INJECTION 76% COMPARISON:  Chest radiograph, 06/05/2018, CT chest, 12/12/2017 FINDINGS: Cardiovascular: Satisfactory opacification of the pulmonary arteries to the segmental level. No evidence of pulmonary embolism. Cardiomegaly. Scattered coronary artery calcifications. Aortic valve calcifications. No pericardial effusion. Mediastinum/Nodes: No enlarged mediastinal, hilar, or axillary lymph nodes. Thyroid gland, trachea, and esophagus demonstrate no significant findings. Lungs/Pleura: Mild diffuse bronchial wall thickening. Minimal paraseptal emphysema. No pleural effusion or pneumothorax. Upper Abdomen: No acute abnormality. Musculoskeletal: No chest wall abnormality. No acute or significant osseous findings. Review of the MIP images confirms the above findings. IMPRESSION: 1.  Negative examination for pulmonary embolism. 2. Mild, diffuse bronchial wall thickening, consistent with nonspecific infectious or inflammatory bronchitis, generally similar to prior CT. 3. Cardiomegaly. Coronary artery disease. Aortic valve calcifications. Electronically Signed   By: Trinna Post  Jayme Cloud M.D.   On: 06/05/2018 16:54   Vas [place] Lower Extremity Venous (dvt) (only Mc & Wl 7a-7p)  Result Date: 06/05/2018  Lower Venous Study Indications: Pain, and Swelling.  Performing Technologist: Gertie Fey MHA, RDMS, RVT, RDCS  Examination Guidelines: A complete evaluation includes B-mode imaging, spectral Doppler, color Doppler, and power Doppler as needed of all accessible portions of each vessel. Bilateral testing is considered an  integral part of a complete examination. Limited examinations for reoccurring indications may be performed as noted.  Right Venous Findings: +---+---------------+---------+-----------+----------+---------+    CompressibilityPhasicitySpontaneityPropertiesSummary   +---+---------------+---------+-----------+----------+---------+ CFVFull           No       Yes                  Pulsatile +---+---------------+---------+-----------+----------+---------+  Left Venous Findings: +---------+---------------+---------+-----------+----------+---------+          CompressibilityPhasicitySpontaneityPropertiesSummary   +---------+---------------+---------+-----------+----------+---------+ CFV      Full           No       Yes                  Pulsatile +---------+---------------+---------+-----------+----------+---------+ SFJ      Full                                                   +---------+---------------+---------+-----------+----------+---------+ FV Prox  Full                                                   +---------+---------------+---------+-----------+----------+---------+ FV Mid   Full                                                   +---------+---------------+---------+-----------+----------+---------+ FV DistalFull                                                   +---------+---------------+---------+-----------+----------+---------+ PFV      Full                                                   +---------+---------------+---------+-----------+----------+---------+ POP      Full           No       Yes                  Pulsatile +---------+---------------+---------+-----------+----------+---------+ PTV      Full                    Yes                            +---------+---------------+---------+-----------+----------+---------+ PERO     Full  Yes                             +---------+---------------+---------+-----------+----------+---------+    Summary: Right: No evidence of common femoral vein obstruction. Left: There is no evidence of deep vein thrombosis in the lower extremity. No cystic structure found in the popliteal fossa. Pulsatile venous flow is suggestive of possibly elevated right sided heart pressure.  *See table(s) above for measurements and observations. Electronically signed by Waverly Ferrari MD on 06/05/2018 at 3:53:35 PM.    Final     Medications:   . albuterol  2.5 mg Nebulization BID  . amLODipine  10 mg Oral Daily  . aspirin  81 mg Oral Daily  . benzonatate  100 mg Oral Q8H  . carvedilol  3.125 mg Oral BID WC  . cloNIDine  0.2 mg Oral TID  . enoxaparin (LOVENOX) injection  40 mg Subcutaneous Q24H  . furosemide  80 mg Oral Q12H  . lisinopril  20 mg Oral Daily  . pantoprazole  40 mg Oral Daily  . sodium chloride flush  3 mL Intravenous Once  . sodium chloride flush  3 mL Intravenous Q12H   Continuous Infusions: . sodium chloride       LOS: 0 days   Joseph Art  [facility]   How to contact the [facility] Attending or Consulting provider 7A - 7P or covering provider during after hours 7P -7A, for this patient?  1. Check the care team in [facility] and look for a) attending/consulting TRH provider listed and b) the [facility] team listed 2. Log into [email or web address] and use [place]'s universal password to access. If you do not have the password, please contact the hospital operator. 3. Locate the [facility] provider you are looking for under [facility] and page to a number that you can be directly reached. 4. If you still have difficulty reaching the provider, please page the [facility] (Director on Call) for the Hospitalists listed on amion for assistance.  06/06/2018, 2:34 PM

## 2018-06-06 NOTE — Progress Notes (Signed)
Pt very worried about sore on penis.  Says it is draining and smelly.

## 2018-06-07 ENCOUNTER — Observation Stay (HOSPITAL_BASED_OUTPATIENT_CLINIC_OR_DEPARTMENT_OTHER): Payer: PRIVATE HEALTH INSURANCE

## 2018-06-07 DIAGNOSIS — I340 Nonrheumatic mitral (valve) insufficiency: Secondary | ICD-10-CM

## 2018-06-07 DIAGNOSIS — I361 Nonrheumatic tricuspid (valve) insufficiency: Secondary | ICD-10-CM

## 2018-06-07 DIAGNOSIS — Z9119 Patient's noncompliance with other medical treatment and regimen: Secondary | ICD-10-CM

## 2018-06-07 LAB — CBC
HCT: 42.2 % (ref 39.0–52.0)
Hemoglobin: 13.8 g/dL (ref 13.0–17.0)
MCH: 23.5 pg — ABNORMAL LOW (ref 26.0–34.0)
MCHC: 32.7 g/dL (ref 30.0–36.0)
MCV: 72 fL — ABNORMAL LOW (ref 80.0–100.0)
Platelets: 146 10*3/uL — ABNORMAL LOW (ref 150–400)
RBC: 5.86 MIL/uL — AB (ref 4.22–5.81)
RDW: 14 % (ref 11.5–15.5)
WBC: 5.4 10*3/uL (ref 4.0–10.5)
nRBC: 0 % (ref 0.0–0.2)

## 2018-06-07 LAB — BASIC METABOLIC PANEL
Anion gap: 12 (ref 5–15)
BUN: 17 mg/dL (ref 6–20)
CO2: 24 mmol/L (ref 22–32)
Calcium: 9.1 mg/dL (ref 8.9–10.3)
Chloride: 102 mmol/L (ref 98–111)
Creatinine, Ser: 1.49 mg/dL — ABNORMAL HIGH (ref 0.61–1.24)
GFR calc Af Amer: 59 mL/min — ABNORMAL LOW (ref >60)
GFR calc non Af Amer: 51 mL/min — ABNORMAL LOW (ref >60)
Glucose, Bld: 130 mg/dL — ABNORMAL HIGH (ref 70–99)
POTASSIUM: 3.4 mmol/L — AB (ref 3.5–5.1)
Sodium: 138 mmol/L (ref 135–145)

## 2018-06-07 LAB — ECHOCARDIOGRAM COMPLETE
Height: 69 in
Weight: 2880 oz

## 2018-06-07 LAB — MAGNESIUM: MAGNESIUM: 2 mg/dL (ref 1.7–2.4)

## 2018-06-07 LAB — RPR: RPR Ser Ql: NONREACTIVE

## 2018-06-07 MED ORDER — FUROSEMIDE 40 MG PO TABS
40.0000 mg | ORAL_TABLET | Freq: Two times a day (BID) | ORAL | Status: DC
  Administered 2018-06-07 – 2018-06-08 (×2): 40 mg via ORAL
  Filled 2018-06-07 (×2): qty 1

## 2018-06-07 MED ORDER — HYDRALAZINE HCL 25 MG PO TABS
25.0000 mg | ORAL_TABLET | Freq: Three times a day (TID) | ORAL | Status: DC
  Administered 2018-06-07 – 2018-06-08 (×3): 25 mg via ORAL
  Filled 2018-06-07 (×3): qty 1

## 2018-06-07 NOTE — Progress Notes (Signed)
Progress Note    [name]  ZOX:[id number] DOB: February 10, 1961  DOA: 06/05/2018 PCP: Patient, No Pcp Per    Brief Narrative:     Medical records reviewed and are as summarized below:  [name] is an 58 y.o. male with past medical history significant for hypertension , hyperlipidemia and mild diastolic congestive heart failure and noncompliance presenting today with 3 weeks history of dry cough, lower extremity swelling.  Patient denies any history of fever chills and congestion.  Assessment/Plan:   Active Problems:   History of noncompliance with medical treatment   Hypertensive urgency   Penile lesion  Penile lesion -painless so ? Syphilis -RPR negative -uriprobe for G/G pending -HIV pending  Dyspnea with exertion -check echo re: AS?  Hypertensive urgency due to noncompliance Resume home medications -adjust medications as able  Mild diastolic congestive heart failure Clinically compensated Resume Lasix at a lower dose  Renal insufficiency -monitor to determine baseline  Hypokalemia -repleted -Mg ok  Family Communication/Anticipated D/C date and plan/Code Status   DVT prophylaxis: Lovenox ordered. Code Status: Full Code.  Family Communication:  Disposition Plan: needs echo and perhaps cardiology consult   Medical Consultants:    None.     Subjective:   SOB worse laying down and with exertion  Objective:    Vitals:   06/06/18 2339 06/07/18 0414 06/07/18 1134 06/07/18 1228  BP: 127/89 (!) 130/96 (!) 164/101 (!) 165/110  Pulse: 61 (!) 56 64   Resp: 18 18 (!) 23   Temp: (!) 97.5 F (36.4 C) (!) 97.5 F (36.4 C) (!) 96.8 F (36 C)   TempSrc: Oral Oral Oral   SpO2: 98% 96% 94%   Weight:      Height:        Intake/Output Summary (Last 24 hours) at 06/07/2018 1431 Last data filed at 06/07/2018 0419 Gross per 24 hour  Intake 50 ml  Output 700 ml  Net -650 ml   Filed Weights   06/05/18 1258  Weight: 81.6 kg    Exam: Sitting in  chair Lungs clear A+Ox3 Rrr, + murmur No LE edema  Data Reviewed:   I have personally reviewed following labs and imaging studies:  Labs: Labs show the following:   Basic Metabolic Panel: Recent Labs  Lab 06/05/18 1013 06/06/18 0552 06/07/18 0457  NA 140 141 138  K 3.5 3.1* 3.4*  CL 107 104 102  CO2 GLUCOSE 121* 99 130*  BUN CREATININE 1.30* 1.35* 1.49*  CALCIUM 9.3 9.0 9.1  MG  --   --  2.0   GFR Estimated Creatinine Clearance: 54 mL/min (A) (by C-G formula based on SCr of 1.49 mg/dL (H)). Liver Function Tests: No results for input(s): AST, ALT, ALKPHOS, BILITOT, PROT, ALBUMIN in the last 168 hours. No results for input(s): LIPASE, AMYLASE in the last 168 hours. No results for input(s): AMMONIA in the last 168 hours. Coagulation profile No results for input(s): INR, PROTIME in the last 168 hours.  CBC: Recent Labs  Lab 06/05/18 1013 06/07/18 0457  WBC 5.3 5.4  HGB 13.3 13.8  HCT 42.4 42.2  MCV 73.6* 72.0*  PLT 137* 146*   Cardiac Enzymes: No results for input(s): CKTOTAL, CKMB, CKMBINDEX, TROPONINI in the last 168 hours. BNP (last 3 results) No results for input(s): PROBNP in the last 8760 hours. CBG: Recent Labs  Lab 06/06/18 1118  GLUCAP 110*   D-Dimer: No results for input(s): DDIMER in the  last 72 hours. Hgb A1c: No results for input(s): HGBA1C in the last 72 hours. Lipid Profile: No results for input(s): CHOL, HDL, LDLCALC, TRIG, CHOLHDL, LDLDIRECT in the last 72 hours. Thyroid function studies: No results for input(s): TSH, T4TOTAL, T3FREE, THYROIDAB in the last 72 hours.  Invalid input(s): FREET3 Anemia work up: No results for input(s): VITAMINB12, FOLATE, FERRITIN, TIBC, IRON, RETICCTPCT in the last 72 hours. Sepsis Labs: Recent Labs  Lab 06/05/18 1013 06/07/18 0457  WBC 5.3 5.4    Microbiology Recent Results (from the past 240 hour(s))  Respiratory Panel by PCR     Status: None   Collection Time: 06/05/18  11:00 PM  Result Value Ref Range Status   Adenovirus NOT DETECTED NOT DETECTED Final   Coronavirus 229E NOT DETECTED NOT DETECTED Final    Comment: (NOTE) The Coronavirus on the Respiratory Panel, DOES NOT test for the novel  Coronavirus (2019 nCoV)    Coronavirus HKU1 NOT DETECTED NOT DETECTED Final   Coronavirus NL63 NOT DETECTED NOT DETECTED Final   Coronavirus OC43 NOT DETECTED NOT DETECTED Final   Metapneumovirus NOT DETECTED NOT DETECTED Final   Rhinovirus / Enterovirus NOT DETECTED NOT DETECTED Final   Influenza A NOT DETECTED NOT DETECTED Final   Influenza B NOT DETECTED NOT DETECTED Final   Parainfluenza Virus 1 NOT DETECTED NOT DETECTED Final   Parainfluenza Virus 2 NOT DETECTED NOT DETECTED Final   Parainfluenza Virus 3 NOT DETECTED NOT DETECTED Final   Parainfluenza Virus 4 NOT DETECTED NOT DETECTED Final   Respiratory Syncytial Virus NOT DETECTED NOT DETECTED Final   Bordetella pertussis NOT DETECTED NOT DETECTED Final   Chlamydophila pneumoniae NOT DETECTED NOT DETECTED Final   Mycoplasma pneumoniae NOT DETECTED NOT DETECTED Final    Comment: Performed at [facility], [address] [place], [place] [address]    Procedures and diagnostic studies:  Ct Angio Chest Pe W/cm &/or Wo Cm  Result Date: 06/05/2018 CLINICAL DATA:  Shortness of breath EXAM: CT ANGIOGRAPHY CHEST WITH CONTRAST TECHNIQUE: Multidetector CT imaging of the chest was performed using the standard protocol during bolus administration of intravenous contrast. Multiplanar CT image reconstructions and MIPs were obtained to evaluate the vascular anatomy. CONTRAST:  <See Chart> ISOVUE-370 IOPAMIDOL (ISOVUE-370) INJECTION 76% COMPARISON:  Chest radiograph, 06/05/2018, CT chest, 12/12/2017 FINDINGS: Cardiovascular: Satisfactory opacification of the pulmonary arteries to the segmental level. No evidence of pulmonary embolism. Cardiomegaly. Scattered coronary artery calcifications. Aortic valve calcifications.  No pericardial effusion. Mediastinum/Nodes: No enlarged mediastinal, hilar, or axillary lymph nodes. Thyroid gland, trachea, and esophagus demonstrate no significant findings. Lungs/Pleura: Mild diffuse bronchial wall thickening. Minimal paraseptal emphysema. No pleural effusion or pneumothorax. Upper Abdomen: No acute abnormality. Musculoskeletal: No chest wall abnormality. No acute or significant osseous findings. Review of the MIP images confirms the above findings. IMPRESSION: 1.  Negative examination for pulmonary embolism. 2. Mild, diffuse bronchial wall thickening, consistent with nonspecific infectious or inflammatory bronchitis, generally similar to prior CT. 3. Cardiomegaly. Coronary artery disease. Aortic valve calcifications. Electronically Signed   By: Lauralyn Primes M.D.   On: 06/05/2018 16:54   Vas [place] Lower Extremity Venous (dvt) (only Mc & Wl 7a-7p)  Result Date: 06/05/2018  Lower Venous Study Indications: Pain, and Swelling.  Performing Technologist: Gertie Fey MHA, RDMS, RVT, RDCS  Examination Guidelines: A complete evaluation includes B-mode imaging, spectral Doppler, color Doppler, and power Doppler as needed of all accessible portions of each vessel. Bilateral testing is considered an integral part of a complete  examination. Limited examinations for reoccurring indications may be performed as noted.  Right Venous Findings: +---+---------------+---------+-----------+----------+---------+    CompressibilityPhasicitySpontaneityPropertiesSummary   +---+---------------+---------+-----------+----------+---------+ CFVFull           No       Yes                  Pulsatile +---+---------------+---------+-----------+----------+---------+  Left Venous Findings: +---------+---------------+---------+-----------+----------+---------+          CompressibilityPhasicitySpontaneityPropertiesSummary   +---------+---------------+---------+-----------+----------+---------+ CFV       Full           No       Yes                  Pulsatile +---------+---------------+---------+-----------+----------+---------+ SFJ      Full                                                   +---------+---------------+---------+-----------+----------+---------+ FV Prox  Full                                                   +---------+---------------+---------+-----------+----------+---------+ FV Mid   Full                                                   +---------+---------------+---------+-----------+----------+---------+ FV DistalFull                                                   +---------+---------------+---------+-----------+----------+---------+ PFV      Full                                                   +---------+---------------+---------+-----------+----------+---------+ POP      Full           No       Yes                  Pulsatile +---------+---------------+---------+-----------+----------+---------+ PTV      Full                    Yes                            +---------+---------------+---------+-----------+----------+---------+ PERO     Full                    Yes                            +---------+---------------+---------+-----------+----------+---------+    Summary: Right: No evidence of common femoral vein obstruction. Left: There is no evidence of deep vein thrombosis in the lower extremity. No cystic structure found in the popliteal fossa. Pulsatile venous flow is suggestive of possibly elevated right sided  heart pressure.  *See table(s) above for measurements and observations. Electronically signed by Waverly Ferrarihristopher Dickson MD on 06/05/2018 at 3:53:35 PM.    Final     Medications:   . amLODipine  10 mg Oral Daily  . aspirin  81 mg Oral Daily  . benzonatate  100 mg Oral Q8H  . carvedilol  3.125 mg Oral BID WC  . cloNIDine  0.2 mg Oral TID  . enoxaparin (LOVENOX) injection  40 mg Subcutaneous Q24H  . furosemide  40  mg Oral Q12H  . hydrALAZINE  25 mg Oral Q8H  . lisinopril  20 mg Oral Daily  . pantoprazole  40 mg Oral Daily  . potassium chloride  40 mEq Oral Daily  . sodium chloride flush  3 mL Intravenous Once  . sodium chloride flush  3 mL Intravenous Q12H   Continuous Infusions: . sodium chloride       LOS: 0 days   Joseph ArtJessica U   [facility]   How to contact the [facility] Attending or Consulting provider 7A - 7P or covering provider during after hours 7P -7A, for this patient?  1. Check the care team in [facility] and look for a) attending/consulting TR[facility] provider listed and b) the [facility] team listed 2. Log into [email or web address] and use [place]'s universal password to access. If you do not have the password, please contact the hospital operator. 3. Locate the [facility] provider you are looking for under [facility] and page to a number that you can be directly reached. 4. If you still have difficulty reaching the provider, please page the [facility] (Director on Call) for the Hospitalists listed on amion for assistance.  06/07/2018, 2:31 PM
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
## 2018-06-07 NOTE — Progress Notes (Signed)
CRITICAL VALUE ALERT  Critical Value:  bp 164/101  Date & Time Notied:  06/07/2018 12:04  Provider Notified: Dr. Benjamine Mola

## 2018-06-07 NOTE — Progress Notes (Signed)
  Echocardiogram 2D Echocardiogram has been performed.  Leta Jungling M 06/07/2018, 3:55 PM

## 2018-06-08 DIAGNOSIS — R0602 Shortness of breath: Secondary | ICD-10-CM | POA: Diagnosis not present

## 2018-06-08 DIAGNOSIS — N489 Disorder of penis, unspecified: Secondary | ICD-10-CM | POA: Diagnosis not present

## 2018-06-08 DIAGNOSIS — I160 Hypertensive urgency: Secondary | ICD-10-CM | POA: Diagnosis not present

## 2018-06-08 LAB — GC/CHLAMYDIA PROBE AMP (~~LOC~~) NOT AT ARMC
Chlamydia: NEGATIVE
Neisseria Gonorrhea: NEGATIVE

## 2018-06-08 MED ORDER — CARVEDILOL 3.125 MG PO TABS: 3.1250 mg | ORAL_TABLET | Freq: Two times a day (BID) | ORAL | 0 refills | Status: DC

## 2018-06-08 MED ORDER — LISINOPRIL 20 MG PO TABS: 20.0000 mg | ORAL_TABLET | Freq: Every day | ORAL | 0 refills | Status: DC

## 2018-06-08 MED ORDER — FUROSEMIDE 40 MG PO TABS: 40.0000 mg | ORAL_TABLET | Freq: Two times a day (BID) | ORAL | 0 refills | Status: DC

## 2018-06-08 MED ORDER — HYDRALAZINE HCL 25 MG PO TABS: 25.0000 mg | ORAL_TABLET | Freq: Three times a day (TID) | ORAL | 0 refills | Status: DC

## 2018-06-08 MED ORDER — PANTOPRAZOLE SODIUM 40 MG PO TBEC: 40.0000 mg | DELAYED_RELEASE_TABLET | Freq: Every day | ORAL | 0 refills | Status: DC

## 2018-06-08 MED ORDER — CLONIDINE HCL 0.2 MG PO TABS: 0.2000 mg | ORAL_TABLET | Freq: Three times a day (TID) | ORAL | 0 refills | Status: DC

## 2018-06-08 MED ORDER — POTASSIUM CHLORIDE ER 10 MEQ PO TBCR: 10.0000 meq | EXTENDED_RELEASE_TABLET | Freq: Two times a day (BID) | ORAL | 0 refills | Status: DC

## 2018-06-08 MED ORDER — AMLODIPINE BESYLATE 10 MG PO TABS: 10.0000 mg | ORAL_TABLET | Freq: Every day | ORAL | 0 refills | Status: DC
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
## 2018-06-08 NOTE — Progress Notes (Signed)
Patient given discharge AVS and instructed on diet/exercise changes. Questions answered, piv removed. Discharge via ambulation with staff.

## 2018-06-09 LAB — HIV 1/2 AB DIFFERENTIATION
HIV 1 Ab: NEGATIVE
HIV 2 Ab: NEGATIVE
Note: NEGATIVE

## 2018-06-09 LAB — RNA QUALITATIVE: HIV 1 RNA Qualitative: 1

## 2018-06-09 LAB — HIV ANTIBODY (ROUTINE TESTING W REFLEX): HIV Screen 4th Generation wRfx: REACTIVE — AB

## 2018-06-21 ENCOUNTER — Inpatient Hospital Stay (INDEPENDENT_AMBULATORY_CARE_PROVIDER_SITE_OTHER): Payer: PRIVATE HEALTH INSURANCE | Admitting: Primary Care

## 2018-06-23 ENCOUNTER — Encounter (INDEPENDENT_AMBULATORY_CARE_PROVIDER_SITE_OTHER): Payer: Self-pay | Admitting: Primary Care

## 2018-07-19 ENCOUNTER — Encounter: Payer: Self-pay | Admitting: Primary Care

## 2018-07-19 ENCOUNTER — Ambulatory Visit (INDEPENDENT_AMBULATORY_CARE_PROVIDER_SITE_OTHER): Payer: PRIVATE HEALTH INSURANCE | Admitting: Primary Care

## 2018-07-19 ENCOUNTER — Ambulatory Visit: Payer: PRIVATE HEALTH INSURANCE | Attending: Primary Care | Admitting: Primary Care

## 2018-07-19 ENCOUNTER — Other Ambulatory Visit: Payer: Self-pay

## 2018-07-19 VITALS — BP 101/84

## 2018-07-19 DIAGNOSIS — I10 Essential (primary) hypertension: Secondary | ICD-10-CM | POA: Diagnosis not present

## 2018-07-19 DIAGNOSIS — K2100 Gastro-esophageal reflux disease with esophagitis, without bleeding: Secondary | ICD-10-CM

## 2018-07-19 DIAGNOSIS — R51 Headache: Secondary | ICD-10-CM

## 2018-07-19 DIAGNOSIS — R0602 Shortness of breath: Secondary | ICD-10-CM

## 2018-07-19 DIAGNOSIS — Z7689 Persons encountering health services in other specified circumstances: Secondary | ICD-10-CM

## 2018-07-19 DIAGNOSIS — R05 Cough: Secondary | ICD-10-CM | POA: Diagnosis not present

## 2018-07-19 DIAGNOSIS — E876 Hypokalemia: Secondary | ICD-10-CM

## 2018-07-19 DIAGNOSIS — Z760 Encounter for issue of repeat prescription: Secondary | ICD-10-CM

## 2018-07-19 MED ORDER — CLONIDINE HCL 0.2 MG PO TABS: 0.2000 mg | ORAL_TABLET | Freq: Three times a day (TID) | ORAL | 3 refills | Status: DC

## 2018-07-19 MED ORDER — PANTOPRAZOLE SODIUM 40 MG PO TBEC: 40.0000 mg | DELAYED_RELEASE_TABLET | Freq: Every day | ORAL | 2 refills | Status: DC

## 2018-07-19 MED ORDER — AMLODIPINE BESYLATE 10 MG PO TABS: 10.0000 mg | ORAL_TABLET | Freq: Every day | ORAL | 3 refills | Status: DC

## 2018-07-19 MED ORDER — LISINOPRIL 20 MG PO TABS: 20.0000 mg | ORAL_TABLET | Freq: Every day | ORAL | 3 refills | Status: DC

## 2018-07-19 MED ORDER — FUROSEMIDE 40 MG PO TABS: 40.0000 mg | ORAL_TABLET | Freq: Two times a day (BID) | ORAL | 3 refills | Status: DC

## 2018-07-19 MED ORDER — HYDRALAZINE HCL 25 MG PO TABS: 25.0000 mg | ORAL_TABLET | Freq: Three times a day (TID) | ORAL | 3 refills | Status: DC

## 2018-07-19 MED ORDER — CARVEDILOL 3.125 MG PO TABS: 3.1250 mg | ORAL_TABLET | Freq: Two times a day (BID) | ORAL | 3 refills | Status: DC

## 2018-07-19 MED ORDER — POTASSIUM CHLORIDE ER 10 MEQ PO TBCR: 10.0000 meq | EXTENDED_RELEASE_TABLET | Freq: Two times a day (BID) | ORAL | 3 refills | Status: DC

## 2018-07-19 MED ORDER — ALLOPURINOL 100 MG PO TABS: 100.0000 mg | ORAL_TABLET | Freq: Every day | ORAL | 6 refills | Status: DC
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
## 2018-07-19 NOTE — Progress Notes (Signed)
Patient verified DOB Patient has taken medication today. Patient has not eaten today. Patient complains of cough with lisinopril. Patient has not taken them in 2 weeks

## 2018-08-16 ENCOUNTER — Other Ambulatory Visit: Payer: Self-pay

## 2018-08-16 ENCOUNTER — Ambulatory Visit: Payer: Self-pay | Attending: Family Medicine

## 2018-08-16 DIAGNOSIS — I10 Essential (primary) hypertension: Secondary | ICD-10-CM

## 2018-08-16 DIAGNOSIS — E876 Hypokalemia: Secondary | ICD-10-CM

## 2018-08-17 LAB — COMPREHENSIVE METABOLIC PANEL
ALT: 30 IU/L (ref 0–44)
AST: 24 IU/L (ref 0–40)
Albumin/Globulin Ratio: 1.2 (ref 1.2–2.2)
Albumin: 4.1 g/dL (ref 3.8–4.9)
Alkaline Phosphatase: 62 IU/L (ref 39–117)
BUN/Creatinine Ratio: 12 (ref 9–20)
BUN: 16 mg/dL (ref 6–24)
Bilirubin Total: 0.4 mg/dL (ref 0.0–1.2)
CO2: 21 mmol/L (ref 20–29)
Calcium: 9.7 mg/dL (ref 8.7–10.2)
Chloride: 102 mmol/L (ref 96–106)
Creatinine, Ser: 1.34 mg/dL — ABNORMAL HIGH (ref 0.76–1.27)
GFR calc Af Amer: 67 mL/min/{1.73_m2} (ref >59)
GFR calc non Af Amer: 58 mL/min/{1.73_m2} — ABNORMAL LOW (ref >59)
Globulin, Total: 3.3 g/dL (ref 1.5–4.5)
Glucose: 109 mg/dL — ABNORMAL HIGH (ref 65–99)
Potassium: 4 mmol/L (ref 3.5–5.2)
Sodium: 141 mmol/L (ref 134–144)
Total Protein: 7.4 g/dL (ref 6.0–8.5)

## 2019-08-21 ENCOUNTER — Other Ambulatory Visit: Payer: Self-pay | Admitting: Primary Care

## 2019-08-22 NOTE — Telephone Encounter (Signed)
Sent to PCP ?

## 2019-11-16 ENCOUNTER — Encounter (HOSPITAL_COMMUNITY): Payer: Self-pay | Admitting: Emergency Medicine

## 2019-11-16 ENCOUNTER — Other Ambulatory Visit: Payer: Self-pay

## 2019-11-16 ENCOUNTER — Emergency Department (HOSPITAL_COMMUNITY)
Admission: EM | Admit: 2019-11-16 | Discharge: 2019-11-17 | Disposition: A | Discharge status: Home / Self Care | Payer: Self-pay | Attending: Emergency Medicine | Admitting: Emergency Medicine

## 2019-11-16 DIAGNOSIS — Z91040 Latex allergy status: Secondary | ICD-10-CM | POA: Insufficient documentation

## 2019-11-16 DIAGNOSIS — Z7982 Long term (current) use of aspirin: Secondary | ICD-10-CM | POA: Insufficient documentation

## 2019-11-16 DIAGNOSIS — R195 Other fecal abnormalities: Secondary | ICD-10-CM

## 2019-11-16 DIAGNOSIS — I110 Hypertensive heart disease with heart failure: Secondary | ICD-10-CM | POA: Insufficient documentation

## 2019-11-16 DIAGNOSIS — M7989 Other specified soft tissue disorders: Secondary | ICD-10-CM

## 2019-11-16 DIAGNOSIS — R2243 Localized swelling, mass and lump, lower limb, bilateral: Secondary | ICD-10-CM | POA: Insufficient documentation

## 2019-11-16 DIAGNOSIS — R6883 Chills (without fever): Secondary | ICD-10-CM | POA: Insufficient documentation

## 2019-11-16 DIAGNOSIS — Z79899 Other long term (current) drug therapy: Secondary | ICD-10-CM | POA: Insufficient documentation

## 2019-11-16 DIAGNOSIS — K921 Melena: Secondary | ICD-10-CM | POA: Insufficient documentation

## 2019-11-16 DIAGNOSIS — I5031 Acute diastolic (congestive) heart failure: Secondary | ICD-10-CM | POA: Insufficient documentation

## 2019-11-16 DIAGNOSIS — D649 Anemia, unspecified: Secondary | ICD-10-CM | POA: Insufficient documentation

## 2019-11-16 LAB — CBC WITH DIFFERENTIAL/PLATELET
Abs Immature Granulocytes: 0 10*3/uL (ref 0.00–0.07)
Basophils Absolute: 0 10*3/uL (ref 0.0–0.1)
Basophils Relative: 0 %
Eosinophils Absolute: 0.3 10*3/uL (ref 0.0–0.5)
Eosinophils Relative: 2 %
HCT: 28.6 % — ABNORMAL LOW (ref 39.0–52.0)
Hemoglobin: 9.4 g/dL — ABNORMAL LOW (ref 13.0–17.0)
Lymphocytes Relative: 12 %
Lymphs Abs: 1.8 10*3/uL (ref 0.7–4.0)
MCH: 21.3 pg — ABNORMAL LOW (ref 26.0–34.0)
MCHC: 32.9 g/dL (ref 30.0–36.0)
MCV: 64.7 fL — ABNORMAL LOW (ref 80.0–100.0)
Monocytes Absolute: 0.1 10*3/uL (ref 0.1–1.0)
Monocytes Relative: 1 %
Neutro Abs: 12.4 10*3/uL — ABNORMAL HIGH (ref 1.7–7.7)
Neutrophils Relative %: 85 %
Platelets: 267 10*3/uL (ref 150–400)
RBC: 4.42 MIL/uL (ref 4.22–5.81)
RDW: 16.6 % — ABNORMAL HIGH (ref 11.5–15.5)
WBC: 14.6 10*3/uL — ABNORMAL HIGH (ref 4.0–10.5)
nRBC: 0 % (ref 0.0–0.2)
nRBC: 0 /100 WBC

## 2019-11-16 LAB — COMPREHENSIVE METABOLIC PANEL
ALT: 13 U/L (ref 0–44)
AST: 24 U/L (ref 15–41)
Albumin: 2.2 g/dL — ABNORMAL LOW (ref 3.5–5.0)
Alkaline Phosphatase: 119 U/L (ref 38–126)
Anion gap: 11 (ref 5–15)
BUN: 30 mg/dL — ABNORMAL HIGH (ref 6–20)
CO2: 21 mmol/L — ABNORMAL LOW (ref 22–32)
Calcium: 9.5 mg/dL (ref 8.9–10.3)
Chloride: 105 mmol/L (ref 98–111)
Creatinine, Ser: 1.82 mg/dL — ABNORMAL HIGH (ref 0.61–1.24)
GFR calc Af Amer: 46 mL/min — ABNORMAL LOW (ref >60)
GFR calc non Af Amer: 40 mL/min — ABNORMAL LOW (ref >60)
Glucose, Bld: 95 mg/dL (ref 70–99)
Potassium: 4.8 mmol/L (ref 3.5–5.1)
Sodium: 137 mmol/L (ref 135–145)
Total Bilirubin: 1.1 mg/dL (ref 0.3–1.2)
Total Protein: 7.6 g/dL (ref 6.5–8.1)

## 2019-11-16 LAB — LACTIC ACID, PLASMA: Lactic Acid, Venous: 1 mmol/L (ref 0.5–1.9)

## 2019-11-16 NOTE — ED Triage Notes (Signed)
Pt reports ? Insect bite to L upper leg x 3 months that has gotten worse and states it has continued to spread with weeping wounds to bilateral legs. Reports fever and chills.

## 2019-11-17 ENCOUNTER — Emergency Department (HOSPITAL_COMMUNITY): Payer: Self-pay

## 2019-11-17 LAB — MRSA PCR SCREENING: MRSA by PCR: NEGATIVE

## 2019-11-17 LAB — POC OCCULT BLOOD, ED: Fecal Occult Bld: POSITIVE — AB

## 2019-11-17 MED ORDER — DOXYCYCLINE HYCLATE 100 MG PO CAPS: 100.0000 mg | ORAL_CAPSULE | Freq: Two times a day (BID) | ORAL | 0 refills | Status: AC

## 2019-11-17 MED ORDER — ACETAMINOPHEN 325 MG PO TABS
650.0000 mg | ORAL_TABLET | Freq: Once | ORAL | Status: AC
  Administered 2019-11-17: 650 mg via ORAL
  Filled 2019-11-17: qty 2

## 2019-11-17 MED ORDER — AMLODIPINE BESYLATE 10 MG PO TABS: 10.0000 mg | ORAL_TABLET | Freq: Every day | ORAL | 2 refills | Status: DC

## 2019-11-17 MED ORDER — CLONIDINE HCL 0.2 MG PO TABS: 0.2000 mg | ORAL_TABLET | Freq: Three times a day (TID) | ORAL | 2 refills | Status: DC

## 2019-11-17 MED ORDER — CEFAZOLIN SODIUM-DEXTROSE 1-4 GM/50ML-% IV SOLN
1.0000 g | Freq: Once | INTRAVENOUS | Status: AC
  Administered 2019-11-17: 1 g via INTRAVENOUS
  Filled 2019-11-17: qty 50

## 2019-11-17 MED ORDER — LISINOPRIL 20 MG PO TABS
20.0000 mg | ORAL_TABLET | Freq: Once | ORAL | Status: AC
  Administered 2019-11-17: 20 mg via ORAL
  Filled 2019-11-17: qty 1

## 2019-11-17 MED ORDER — LACTATED RINGERS IV BOLUS
250.0000 mL | Freq: Once | INTRAVENOUS | Status: AC
  Administered 2019-11-17: 250 mL via INTRAVENOUS

## 2019-11-17 MED ORDER — CEPHALEXIN 500 MG PO CAPS
500.0000 mg | ORAL_CAPSULE | Freq: Two times a day (BID) | ORAL | 0 refills | Status: DC
Start: 2019-11-17 — End: 2019-11-17

## 2019-11-17 MED ORDER — LACTATED RINGERS IV BOLUS: 500.0000 mL | Freq: Once | INTRAVENOUS | Status: DC

## 2019-11-17 MED ORDER — CLONIDINE HCL 0.2 MG PO TABS
0.2000 mg | ORAL_TABLET | Freq: Once | ORAL | Status: AC
  Administered 2019-11-17: 0.2 mg via ORAL
  Filled 2019-11-17: qty 1

## 2019-11-17 MED ORDER — ASPIRIN 81 MG PO CHEW: 81.0000 mg | CHEWABLE_TABLET | Freq: Every day | ORAL | 3 refills | Status: DC

## 2019-11-17 MED ORDER — CEPHALEXIN 500 MG PO CAPS
500.0000 mg | ORAL_CAPSULE | Freq: Four times a day (QID) | ORAL | 0 refills | Status: DC
Start: 2019-11-17 — End: 2019-11-17

## 2019-11-17 MED ORDER — CARVEDILOL 3.125 MG PO TABS: 3.1250 mg | ORAL_TABLET | Freq: Two times a day (BID) | ORAL | 2 refills | Status: DC

## 2019-11-17 MED ORDER — LISINOPRIL 20 MG PO TABS: 20.0000 mg | ORAL_TABLET | Freq: Every day | ORAL | 2 refills | Status: DC

## 2019-11-17 MED ORDER — AMLODIPINE BESYLATE 5 MG PO TABS
5.0000 mg | ORAL_TABLET | Freq: Once | ORAL | Status: AC
  Administered 2019-11-17: 5 mg via ORAL
  Filled 2019-11-17: qty 1

## 2019-11-17 NOTE — ED Notes (Signed)
Patient verbalizes understanding of discharge instructions. Opportunity for questioning and answers were provided. Armband removed by staff, pt discharged from ED via wheelchair.  
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
## 2019-11-17 NOTE — Discharge Instructions (Addendum)
There are multiple things you will need to follow-up on and get further evaluation.  First, you will need to establish care with a primary care provider.  This will need to be done as soon as possible. They will need to manage your high blood pressure.  You have evidence of decreased kidney function.  This will also need to be managed and further evaluated by a primary care provider.  You have a drop in your hemoglobin, AKA anemia.  You also had evidence of microscopic blood in your stool.  This will need to be further assessed and managed by a gastroenterologist.  Call to make an appointment.  Your lower extremity swelling and pain may be due to difficulty with blood flow through the veins.  This will need to be further assessed by a vein specialist.  Call to make an appointment on this as well.  Due to the wounds on your legs, you should follow up with the wound care clinic.  Please take all of your antibiotics until finished!   You may develop abdominal discomfort or diarrhea from the antibiotic.  You may help offset this with probiotics which you can buy or get in yogurt. Do not eat or take the probiotics until 2 hours after your antibiotic.   Return to the emergency department for persistent high fever along with leg swelling, shortness of breath, chest pain, uncontrolled vomiting, inability to urinate, or any other major concerns.

## 2019-11-17 NOTE — Care Management (Signed)
Asked to consult for patient to refer to PCP. Patient states he does to a MD for BP medication. Patient does not know who MD is. BP continues to be high. Needs Internal Medicine MD with kidney issues and ongoing BP control problems. Will refer to internal medicine. Will follow up with patient as he is extremely sleepy . Does state he gets around alright at home. , does not need any assistance at this time. Marland Kitchen

## 2019-11-22 LAB — CULTURE, BLOOD (ROUTINE X 2)
Culture: NO GROWTH
Culture: NO GROWTH

## 2020-01-27 ENCOUNTER — Other Ambulatory Visit: Payer: Self-pay

## 2020-01-27 DIAGNOSIS — Z20822 Contact with and (suspected) exposure to covid-19: Secondary | ICD-10-CM

## 2020-01-28 LAB — SARS-COV-2, NAA 2 DAY TAT

## 2020-01-28 LAB — NOVEL CORONAVIRUS, NAA: SARS-CoV-2, NAA: NOT DETECTED

## 2020-01-30 ENCOUNTER — Encounter (HOSPITAL_COMMUNITY): Payer: Self-pay | Admitting: Family Medicine

## 2020-01-30 ENCOUNTER — Encounter (HOSPITAL_COMMUNITY): Payer: Self-pay | Admitting: Emergency Medicine

## 2020-01-30 ENCOUNTER — Emergency Department (HOSPITAL_COMMUNITY): Payer: Medicaid Other

## 2020-01-30 ENCOUNTER — Emergency Department (HOSPITAL_COMMUNITY)
Admission: EM | Admit: 2020-01-30 | Discharge: 2020-01-30 | Disposition: A | Discharge status: Home / Self Care | Payer: Medicaid Other | Attending: Emergency Medicine | Admitting: Emergency Medicine

## 2020-01-30 ENCOUNTER — Ambulatory Visit: Payer: Self-pay | Admitting: *Deleted

## 2020-01-30 ENCOUNTER — Other Ambulatory Visit: Payer: Self-pay

## 2020-01-30 ENCOUNTER — Ambulatory Visit (HOSPITAL_COMMUNITY)
Admission: EM | Admit: 2020-01-30 | Discharge: 2020-01-30 | Disposition: A | Discharge status: Home / Self Care | Payer: Self-pay

## 2020-01-30 DIAGNOSIS — M79605 Pain in left leg: Secondary | ICD-10-CM | POA: Insufficient documentation

## 2020-01-30 DIAGNOSIS — I110 Hypertensive heart disease with heart failure: Secondary | ICD-10-CM | POA: Insufficient documentation

## 2020-01-30 DIAGNOSIS — R079 Chest pain, unspecified: Secondary | ICD-10-CM | POA: Insufficient documentation

## 2020-01-30 DIAGNOSIS — M79604 Pain in right leg: Secondary | ICD-10-CM | POA: Diagnosis not present

## 2020-01-30 DIAGNOSIS — Z91040 Latex allergy status: Secondary | ICD-10-CM | POA: Insufficient documentation

## 2020-01-30 DIAGNOSIS — R600 Localized edema: Secondary | ICD-10-CM | POA: Insufficient documentation

## 2020-01-30 DIAGNOSIS — Z79899 Other long term (current) drug therapy: Secondary | ICD-10-CM | POA: Diagnosis not present

## 2020-01-30 DIAGNOSIS — Z7982 Long term (current) use of aspirin: Secondary | ICD-10-CM | POA: Insufficient documentation

## 2020-01-30 DIAGNOSIS — R0602 Shortness of breath: Secondary | ICD-10-CM | POA: Diagnosis not present

## 2020-01-30 DIAGNOSIS — I5031 Acute diastolic (congestive) heart failure: Secondary | ICD-10-CM | POA: Diagnosis not present

## 2020-01-30 LAB — RAPID URINE DRUG SCREEN, HOSP PERFORMED
Amphetamines: NOT DETECTED
Barbiturates: NOT DETECTED
Benzodiazepines: NOT DETECTED
Cocaine: NOT DETECTED
Opiates: NOT DETECTED
Tetrahydrocannabinol: NOT DETECTED

## 2020-01-30 LAB — BASIC METABOLIC PANEL
Anion gap: 10 (ref 5–15)
BUN: 21 mg/dL — ABNORMAL HIGH (ref 6–20)
CO2: 23 mmol/L (ref 22–32)
Calcium: 9.2 mg/dL (ref 8.9–10.3)
Chloride: 106 mmol/L (ref 98–111)
Creatinine, Ser: 1.34 mg/dL — ABNORMAL HIGH (ref 0.61–1.24)
GFR, Estimated: >60 mL/min (ref >60)
Glucose, Bld: 107 mg/dL — ABNORMAL HIGH (ref 70–99)
Potassium: 4 mmol/L (ref 3.5–5.1)
Sodium: 139 mmol/L (ref 135–145)

## 2020-01-30 LAB — CBC
HCT: 38.7 % — ABNORMAL LOW (ref 39.0–52.0)
Hemoglobin: 11.7 g/dL — ABNORMAL LOW (ref 13.0–17.0)
MCH: 22.3 pg — ABNORMAL LOW (ref 26.0–34.0)
MCHC: 30.2 g/dL (ref 30.0–36.0)
MCV: 73.7 fL — ABNORMAL LOW (ref 80.0–100.0)
Platelets: 234 10*3/uL (ref 150–400)
RBC: 5.25 MIL/uL (ref 4.22–5.81)
RDW: 16.9 % — ABNORMAL HIGH (ref 11.5–15.5)
WBC: 5 10*3/uL (ref 4.0–10.5)
nRBC: 0 % (ref 0.0–0.2)

## 2020-01-30 LAB — BRAIN NATRIURETIC PEPTIDE: B Natriuretic Peptide: 162.8 pg/mL — ABNORMAL HIGH (ref 0.0–100.0)

## 2020-01-30 LAB — TROPONIN I (HIGH SENSITIVITY)
Troponin I (High Sensitivity): 38 ng/L — ABNORMAL HIGH (ref <18)
Troponin I (High Sensitivity): 35 ng/L — ABNORMAL HIGH (ref <18)

## 2020-01-30 MED ORDER — LABETALOL HCL 5 MG/ML IV SOLN
10.0000 mg | Freq: Once | INTRAVENOUS | Status: AC
  Administered 2020-01-30: 10 mg via INTRAVENOUS
  Filled 2020-01-30: qty 4

## 2020-01-30 MED ORDER — CLONIDINE HCL 0.2 MG PO TABS
0.2000 mg | ORAL_TABLET | Freq: Once | ORAL | Status: AC
  Administered 2020-01-30: 0.2 mg via ORAL
  Filled 2020-01-30: qty 1

## 2020-01-30 NOTE — Telephone Encounter (Signed)
Pt called with complaints of cellulitis which started in August; he was seen in the ED on 11/16/19 and was started on antibiotics; thept says he has not been able to make a follow up appt because he had 2 deaths in his family (mother and sister); the pt states the skin has been peeling off his legs since 01/27/20, and his legs are leaking fluid; he says his legs are turning "white/raw"; the pt says he is having pain "like pens and needles rated 4 out of 10" which started last month he denies fever; recommendations made per nurse triage protocol; he verbalized understanding but would like to be seen in the office; there is no availabilty within the timeframe per protocol; he states he will try to go to Urgent Care; he would also like for the office to contact him if an appt becomes available; the pt can be contacted at [phone]; will route to office for notification. Reason for Disposition . SEVERE leg swelling (e.g., swelling extends above knee, entire leg is swollen, weeping fluid)  Answer Assessment - Initial Assessment Questions 1. ONSET: "When did the swelling start?" (e.g., minutes, hours, days)     Months; seen in ED 11/16/19 2. LOCATION: "What part of the leg is swollen?"  "Are both legs swollen or just one leg?"    Calf to feet on both legs 3. SEVERITY: "How bad is the swelling?" (e.g., localized; mild, moderate, severe)  - Localized - small area of swelling localized to one leg  - MILD pedal edema - swelling limited to foot and ankle, pitting edema < 1/4 inch (6 mm) deep, rest and elevation eliminate most or all swelling  - MODERATE edema - swelling of lower leg to knee, pitting edema > 1/4 inch (6 mm) deep, rest and elevation only partially reduce swelling  - SEVERE edema - swelling extends above knee, facial or hand swelling present     moderate, "area is hard" 4. REDNESS: "Does the swelling look red or infected?"     yes 5. PAIN: "Is the swelling painful to touch?" If Yes, ask: "How  painful is it?"   (Scale 1-10; mild, moderate or severe)     4 out of 10 6. FEVER: "Do you have a fever?" If Yes, ask: "What is it, how was it measured, and when did it start?"      no 7. CAUSE: "What do you think is causing the leg swelling?"    Cellulitis 8. MEDICAL HISTORY: "Do you have a history of heart failure, kidney disease, liver failure, or cancer?"     CHF 9. RECURRENT SYMPTOM: "Have you had leg swelling before?" If Yes, ask: "When was the last time?" "What happened that time?"     Seen in ED 11/16/19 10. OTHER SYMPTOMS: "Do you have any other symptoms?" (e.g., chest pain, difficulty breathing)      no 11. PREGNANCY: "Is there any chance you are pregnant?" "When was your last menstrual period?"      n/a  Protocols used: LEG SWELLING AND EDEMA-A-AH

## 2020-01-30 NOTE — Telephone Encounter (Signed)
Attempt to call patient to schedule an appt for f/u at this time since he had an OV in the UC. LVM to return call.

## 2020-01-30 NOTE — ED Notes (Signed)
Pt discharged. Reviewed d/c paperwork All questions/concerns addressed at this time.

## 2020-01-30 NOTE — Telephone Encounter (Signed)
Several attempt made to call patient at listed numbers. Goes to [employer].  LVM informing patient to return call.   Would like to infom patient of appt options: 1. Go to  Primary Care at [facility] for the [facility] appointments. This is the soonest appointment we have.  2. schedule with Dr.  Laural Benes tomorrow at 10:50  3. Schedule with Dr. Cato Mulligan on 02/05/2020.
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
## 2020-01-30 NOTE — Discharge Instructions (Addendum)
Follow-up with the wound care center as we discussed.  I have included their information.  Call the office number to schedule an accessible appointment.  You can tylenol and ibuprofen for your pain. Take as directed on the bottles.  You can also elevate your legs to help with swelling.  Return to the emergency department if symptoms worsen.

## 2020-01-30 NOTE — ED Triage Notes (Signed)
Pt complaining increase sob, leg infection and pain for the past few weeks.
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
## 2020-02-11 ENCOUNTER — Encounter (HOSPITAL_COMMUNITY): Payer: Self-pay | Admitting: Emergency Medicine

## 2020-02-11 ENCOUNTER — Other Ambulatory Visit: Payer: Self-pay | Admitting: Critical Care Medicine

## 2020-02-11 ENCOUNTER — Ambulatory Visit: Payer: Medicaid Other | Attending: Critical Care Medicine | Admitting: Critical Care Medicine

## 2020-02-11 ENCOUNTER — Encounter: Payer: Self-pay | Admitting: Critical Care Medicine

## 2020-02-11 ENCOUNTER — Emergency Department (HOSPITAL_COMMUNITY)
Admission: EM | Admit: 2020-02-11 | Discharge: 2020-02-11 | Disposition: A | Discharge status: Home / Self Care | Payer: Medicaid Other | Attending: Emergency Medicine | Admitting: Emergency Medicine

## 2020-02-11 ENCOUNTER — Other Ambulatory Visit: Payer: Self-pay

## 2020-02-11 VITALS — BP 230/139 | HR 92 | Wt 205.4 lb

## 2020-02-11 DIAGNOSIS — I5031 Acute diastolic (congestive) heart failure: Secondary | ICD-10-CM | POA: Diagnosis not present

## 2020-02-11 DIAGNOSIS — I110 Hypertensive heart disease with heart failure: Secondary | ICD-10-CM | POA: Diagnosis not present

## 2020-02-11 DIAGNOSIS — S81809S Unspecified open wound, unspecified lower leg, sequela: Secondary | ICD-10-CM | POA: Diagnosis not present

## 2020-02-11 DIAGNOSIS — Z91040 Latex allergy status: Secondary | ICD-10-CM | POA: Insufficient documentation

## 2020-02-11 DIAGNOSIS — Z79899 Other long term (current) drug therapy: Secondary | ICD-10-CM | POA: Diagnosis not present

## 2020-02-11 DIAGNOSIS — I10 Essential (primary) hypertension: Secondary | ICD-10-CM

## 2020-02-11 DIAGNOSIS — E7841 Elevated Lipoprotein(a): Secondary | ICD-10-CM

## 2020-02-11 DIAGNOSIS — R600 Localized edema: Secondary | ICD-10-CM

## 2020-02-11 DIAGNOSIS — I160 Hypertensive urgency: Secondary | ICD-10-CM | POA: Diagnosis not present

## 2020-02-11 DIAGNOSIS — I161 Hypertensive emergency: Secondary | ICD-10-CM

## 2020-02-11 DIAGNOSIS — X58XXXA Exposure to other specified factors, initial encounter: Secondary | ICD-10-CM | POA: Insufficient documentation

## 2020-02-11 DIAGNOSIS — Z7982 Long term (current) use of aspirin: Secondary | ICD-10-CM | POA: Diagnosis not present

## 2020-02-11 DIAGNOSIS — S81809A Unspecified open wound, unspecified lower leg, initial encounter: Secondary | ICD-10-CM | POA: Diagnosis not present

## 2020-02-11 DIAGNOSIS — I350 Nonrheumatic aortic (valve) stenosis: Secondary | ICD-10-CM | POA: Diagnosis not present

## 2020-02-11 HISTORY — DX: Localized edema: R60.0

## 2020-02-11 LAB — BASIC METABOLIC PANEL
Anion gap: 11 (ref 5–15)
BUN: 18 mg/dL (ref 6–20)
CO2: 23 mmol/L (ref 22–32)
Calcium: 9.2 mg/dL (ref 8.9–10.3)
Chloride: 104 mmol/L (ref 98–111)
Creatinine, Ser: 1.21 mg/dL (ref 0.61–1.24)
GFR, Estimated: >60 mL/min (ref >60)
Glucose, Bld: 102 mg/dL — ABNORMAL HIGH (ref 70–99)
Potassium: 3.7 mmol/L (ref 3.5–5.1)
Sodium: 138 mmol/L (ref 135–145)

## 2020-02-11 LAB — CBC
HCT: 37.9 % — ABNORMAL LOW (ref 39.0–52.0)
Hemoglobin: 11.4 g/dL — ABNORMAL LOW (ref 13.0–17.0)
MCH: 21.8 pg — ABNORMAL LOW (ref 26.0–34.0)
MCHC: 30.1 g/dL (ref 30.0–36.0)
MCV: 72.6 fL — ABNORMAL LOW (ref 80.0–100.0)
Platelets: 250 10*3/uL (ref 150–400)
RBC: 5.22 MIL/uL (ref 4.22–5.81)
RDW: 16.3 % — ABNORMAL HIGH (ref 11.5–15.5)
WBC: 5 10*3/uL (ref 4.0–10.5)
nRBC: 0 % (ref 0.0–0.2)

## 2020-02-11 MED ORDER — CARVEDILOL 12.5 MG PO TABS: 12.5000 mg | ORAL_TABLET | Freq: Two times a day (BID) | ORAL | 2 refills | Status: DC

## 2020-02-11 MED ORDER — CLONIDINE HCL 0.2 MG PO TABS: 0.2000 mg | ORAL_TABLET | Freq: Three times a day (TID) | ORAL | 2 refills | Status: DC

## 2020-02-11 MED ORDER — ALLOPURINOL 100 MG PO TABS
100.0000 mg | ORAL_TABLET | Freq: Every day | ORAL | 6 refills | Status: DC
Start: 2020-02-11 — End: 2020-02-11

## 2020-02-11 MED ORDER — HYDRALAZINE HCL 50 MG PO TABS: 50.0000 mg | ORAL_TABLET | Freq: Three times a day (TID) | ORAL | 2 refills | Status: DC

## 2020-02-11 MED ORDER — POTASSIUM CHLORIDE ER 10 MEQ PO TBCR
10.0000 meq | EXTENDED_RELEASE_TABLET | Freq: Two times a day (BID) | ORAL | 3 refills | Status: DC
Start: 2020-02-11 — End: 2020-04-01

## 2020-02-11 MED ORDER — ASPIRIN 81 MG PO CHEW
81.0000 mg | CHEWABLE_TABLET | Freq: Every day | ORAL | 3 refills | Status: DC
Start: 2020-02-11 — End: 2021-06-10

## 2020-02-11 MED ORDER — CLONIDINE HCL 0.1 MG PO TABS
0.2000 mg | ORAL_TABLET | Freq: Once | ORAL | Status: AC
  Administered 2020-02-11: 0.2 mg via ORAL

## 2020-02-11 MED ORDER — CLONIDINE HCL 0.1 MG PO TABS: 0.2000 mg | ORAL_TABLET | Freq: Once | ORAL | Status: DC

## 2020-02-11 MED ORDER — PANTOPRAZOLE SODIUM 40 MG PO TBEC
40.0000 mg | DELAYED_RELEASE_TABLET | Freq: Every day | ORAL | 2 refills | Status: DC
Start: 2020-02-11 — End: 2020-02-11

## 2020-02-11 MED ORDER — AMLODIPINE BESYLATE 10 MG PO TABS: 10.0000 mg | ORAL_TABLET | Freq: Every day | ORAL | 2 refills | Status: DC

## 2020-02-11 MED ORDER — FUROSEMIDE 40 MG PO TABS
40.0000 mg | ORAL_TABLET | Freq: Two times a day (BID) | ORAL | 3 refills | Status: DC
Start: 2020-02-11 — End: 2020-04-01

## 2020-02-11 MED ORDER — CLONIDINE HCL 0.2 MG PO TABS
0.2000 mg | ORAL_TABLET | Freq: Once | ORAL | Status: AC
  Administered 2020-02-11: 0.2 mg via ORAL

## 2020-02-11 MED FILL — FUROSEMIDE 40 MG TAB: 40 | 30 days supply | Qty: 60 | Fill #0

## 2020-02-11 MED FILL — POTASSIUM CHLORIDE ER 10 ME: 10 | 30 days supply | Qty: 60 | Fill #0

## 2020-02-11 MED FILL — hydrALAZINE HCL 50 MG TABS: 50 | 30 days supply | Qty: 90 | Fill #0

## 2020-02-11 MED FILL — CARVEDILOL 12.5 MG TABLET: 12.5 | 30 days supply | Qty: 60 | Fill #0

## 2020-02-11 MED FILL — ALLOPURINOL 100 MG TABLET: 100 | 30 days supply | Qty: 30 | Fill #0

## 2020-02-11 MED FILL — PANTOPRAZOLE SOD DR 40 MG T: 40 | 30 days supply | Qty: 30 | Fill #0

## 2020-02-11 MED FILL — AMLODIPINE BESYLATE 10 MG T: 10 | 30 days supply | Qty: 30 | Fill #0

## 2020-02-11 MED FILL — cloNIDine HCL 0.2 MG TABS: 0.2 | 30 days supply | Qty: 90 | Fill #0

## 2020-02-11 NOTE — Assessment & Plan Note (Signed)
We applied nonstick gauze dressing with triple antibiotic ointment Kerlix and Ace wraps and will refer to the wound care center no antibiotics appear to be necessary at this time systemically

## 2020-02-11 NOTE — Discharge Instructions (Addendum)
Make sure you follow-up with wound care regarding the leg wounds, as soon as possible.  Call them in the morning for follow-up appointment.  Take all of your blood pressure medicine as usual.  See your medical doctor for blood pressure check in a week or 2.  Return here, if needed.

## 2020-02-11 NOTE — Assessment & Plan Note (Signed)
Patient's had repeated episodes of hypertensive emergencies today is yet another I did send the patient over to the emergency room for further treatment and I did refill all of his blood pressure medicines to include amlodipine 10 mg daily hydralazine 50 mg three times daily Coreg 12-1/2 mg twice daily furosemide 40 mg twice daily and clonidine 0.2 mg three times daily.  Note from those medications the Coreg and the hydralazine doses are increased  The patient may be admitted and further adjustments may be required if he is discharged in the emergency room I will   see him again in a week

## 2020-02-11 NOTE — Assessment & Plan Note (Addendum)
Acute diastolic heart failure exacerbated by hypertensive crisis blood pressure 240/140 with evidence of mild pulmonary edema on exam  Note in the emergency room chest x-[place] did not show evidence of edema on October 28  Despite two doses of 0.2 mg clonidine the blood pressure would not fall and we referred the patient over to the emergency department across the street for further treatments  At some point I would like for the patient to see cardiology a referral was made

## 2020-02-11 NOTE — Patient Instructions (Signed)
Labs today include metabolic panel  You were given clonidine to reduce your blood pressure  All of your medications were resumed and sent to our pharmacy please pick these up and begin these medications  Referral to wound care clinic was made  Return to Dr. Delford Field 1 week for recheck  Please watch the salt intake in your diet as outlined below  Referral back to the heart clinic was also made   Heart Failure Eating Plan Heart failure, also called congestive heart failure, occurs when your heart does not pump blood well enough to meet your body's needs for oxygen-rich blood. Heart failure is a long-term (chronic) condition. Living with heart failure can be challenging. However, following your health care provider's instructions about a healthy lifestyle and working with a diet and nutrition specialist (dietitian) to choose the right foods may help to improve your symptoms. What are tips for following this plan? Reading food labels  Check food labels for the amount of sodium per serving. Choose foods that have less than 140 mg (milligrams) of sodium in each serving.  Check food labels for the number of calories per serving. This is important if you need to limit your daily calorie intake to lose weight.  Check food labels for the serving size. If you eat more than one serving, you will be eating more sodium and calories than what is listed on the label.  Look for foods that are labeled as "sodium-free," "very low sodium," or "low sodium." ? Foods labeled as "reduced sodium" or "lightly salted" may still have more sodium than what is recommended for you. Cooking  Avoid adding salt when cooking. Ask your health care provider or dietitian before using salt substitutes.  Season food with salt-free seasonings, spices, or herbs. Check the label of seasoning mixes to make sure they do not contain salt.  Cook with heart-healthy oils, such as olive, canola, soybean, or sunflower oil.  Do not  fry foods. Cook foods using low-fat methods, such as baking, boiling, grilling, and broiling.  Limit unhealthy fats when cooking by: ? Removing the skin from poultry, such as chicken. ? Removing all visible fats from meats. ? Skimming the fat off from stews, soups, and gravies before serving them. Meal planning   Limit your intake of: ? Processed, canned, or pre-packaged foods. ? Foods that are high in trans fat, such as fried foods. ? Sweets, desserts, sugary drinks, and other foods with added sugar. ? Full-fat dairy products, such as whole milk.  Eat a balanced diet that includes: ? 4-5 servings of fruit each day and 4-5 servings of vegetables each day. At each meal, try to fill half of your plate with fruits and vegetables. ? Up to 6-8 servings of whole grains each day. ? Up to 2 servings of lean meat, poultry, or fish each day. One serving of meat is equal to 3 oz. This is about the same size as a deck of cards. ? 2 servings of low-fat dairy each day. ? Heart-healthy fats. Healthy fats called omega-3 fatty acids are found in foods such as flaxseed and cold-water fish like sardines, salmon, and mackerel.  Aim to eat 25-35 g (grams) of fiber a day. Foods that are high in fiber include apples, broccoli, carrots, beans, peas, and whole grains.  Do not add salt or condiments that contain salt (such as soy sauce) to foods before eating.  When eating at a restaurant, ask that your food be prepared with less salt or no  salt, if possible.  Try to eat 2 or more vegetarian meals each week.  Eat more home-cooked food and eat less restaurant, buffet, and fast food. General information  Do not eat more than 2,300 mg of salt (sodium) a day. The amount of sodium that is recommended for you may be lower, depending on your condition.  Maintain a healthy body weight as directed. Ask your health care provider what a healthy weight is for you. ? Check your weight every day. ? Work with your  health care provider and dietitian to make a plan that is right for you to lose weight or maintain your current weight.  Limit how much fluid you drink. Ask your health care provider or dietitian how much fluid you can have each day.  Limit or avoid alcohol as told by your health care provider or dietitian. Recommended foods The items listed may not be a complete list. Talk with your dietitian about what dietary choices are best for you. Fruits All fresh, frozen, and canned fruits. Dried fruits, such as raisins, prunes, and cranberries. Vegetables All fresh vegetables. Vegetables that are frozen without sauce or added salt. Low-sodium or sodium-free canned vegetables. Grains Bread with less than 80 mg of sodium per slice. Whole-wheat pasta, quinoa, and brown rice. Oats and oatmeal. Barley. Millet. Grits and cream of wheat. Whole-grain and whole-wheat cold cereal. Meats and other protein foods Lean cuts of meat. Skinless chicken and [place]. Fish with high omega-3 fatty acids, such as salmon, sardines, and other cold-water fishes. Eggs. Dried beans, peas, and edamame. Unsalted nuts and nut butters. Dairy Low-fat or nonfat (skim) milk and dried milk. Rice milk, soy milk, and almond milk. Low-fat or nonfat yogurt. Small amounts of reduced-sodium block cheese. Low-sodium cottage cheese. Fats and oils Olive, canola, soybean, flaxseed, or sunflower oil. Avocado. Sweets and desserts Apple sauce. Granola bars. Sugar-free pudding and gelatin. Frozen fruit bars. Seasoning and other foods Fresh and dried herbs. Lemon or lime juice. Vinegar. Low-sodium ketchup. Salt-free marinades, salad dressings, sauces, and seasonings. The items listed above may not be a complete list of foods and beverages you can eat. Contact a dietitian for more information. Foods to avoid The items listed may not be a complete list. Talk with your dietitian about what dietary choices are best for you. Fruits Fruits that are  dried with sodium-containing preservatives. Vegetables Canned vegetables. Frozen vegetables with sauce or seasonings. Creamed vegetables. [place] fries. Onion rings. Pickled vegetables and sauerkraut. Grains Bread with more than 80 mg of sodium per slice. Hot or cold cereal with more than 140 mg sodium per serving. Salted pretzels and crackers. Pre-packaged breadcrumbs. Bagels, croissants, and biscuits. Meats and other protein foods Ribs and chicken wings. Bacon, ham, pepperoni, bologna, salami, and packaged luncheon meats. Hot dogs, bratwurst, and sausage. Canned meat. Smoked meat and fish. Salted nuts and seeds. Dairy Whole milk, half-and-half, and cream. Buttermilk. Processed cheese, cheese spreads, and cheese curds. Regular cottage cheese. Feta cheese. Shredded cheese. String cheese. Fats and oils Butter, lard, shortening, ghee, and bacon fat. Canned and packaged gravies. Seasoning and other foods Onion salt, garlic salt, table salt, and sea salt. Marinades. Regular salad dressings. Relishes, pickles, and olives. Meat flavorings and tenderizers, and bouillon cubes. Horseradish, ketchup, and mustard. Worcestershire sauce. Teriyaki sauce, soy sauce (including reduced sodium). Hot sauce and Tabasco sauce. Steak sauce, fish sauce, oyster sauce, and cocktail sauce. Taco seasonings. Barbecue sauce. Tartar sauce. The items listed above may not be a complete list of foods  and beverages you should avoid. Contact a dietitian for more information. Summary  A heart failure eating plan includes changes that limit your intake of sodium and unhealthy fat, and it may help you lose weight or maintain a healthy weight. Your health care provider may also recommend limiting how much fluid you drink.  Most people with heart failure should eat no more than 2,300 mg of salt (sodium) a day. The amount of sodium that is recommended for you may be lower, depending on your condition.  Contact your health care provider  or dietitian before making any major changes to your diet. This information is not intended to replace advice given to you by your health care provider. Make sure you discuss any questions you have with your health care provider. Document Revised: 05/17/2018 Document Reviewed: 08/05/2016 Elsevier Patient Education  2020 [employer].

## 2020-02-11 NOTE — ED Triage Notes (Signed)
Pt arrives to ED from community health clinic with complaints of uncontrolled hypertension. Pt given clonidine by MD w/o lying BP. Pt currently with cellulitis and edema of both legs is reason for community health clinic visit.
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
## 2020-02-11 NOTE — Assessment & Plan Note (Signed)
Hold off on lipid therapy

## 2020-02-17 ENCOUNTER — Telehealth: Payer: Self-pay | Admitting: *Deleted

## 2020-02-17 NOTE — Telephone Encounter (Signed)
MA attempted to reach patient on both lines with no success. Please confirmed 3:30 appointment on 11/15 and screen for URI symptoms.

## 2020-02-18 ENCOUNTER — Other Ambulatory Visit: Payer: Self-pay | Admitting: Critical Care Medicine

## 2020-02-18 ENCOUNTER — Ambulatory Visit: Payer: Medicaid Other | Attending: Critical Care Medicine | Admitting: Critical Care Medicine

## 2020-02-18 ENCOUNTER — Telehealth: Payer: Self-pay

## 2020-02-18 ENCOUNTER — Other Ambulatory Visit: Payer: Self-pay

## 2020-02-18 ENCOUNTER — Encounter: Payer: Self-pay | Admitting: Critical Care Medicine

## 2020-02-18 VITALS — BP 150/95 | HR 84 | Wt 208.0 lb

## 2020-02-18 DIAGNOSIS — R600 Localized edema: Secondary | ICD-10-CM | POA: Diagnosis not present

## 2020-02-18 DIAGNOSIS — L03119 Cellulitis of unspecified part of limb: Secondary | ICD-10-CM | POA: Diagnosis not present

## 2020-02-18 DIAGNOSIS — I10 Essential (primary) hypertension: Secondary | ICD-10-CM

## 2020-02-18 DIAGNOSIS — I5032 Chronic diastolic (congestive) heart failure: Secondary | ICD-10-CM

## 2020-02-18 MED ORDER — DOXYCYCLINE HYCLATE 100 MG PO TABS: 100.0000 mg | ORAL_TABLET | Freq: Two times a day (BID) | ORAL | 0 refills | Status: DC

## 2020-02-18 MED ORDER — CARVEDILOL 25 MG PO TABS: 25.0000 mg | ORAL_TABLET | Freq: Two times a day (BID) | ORAL | 2 refills | Status: DC

## 2020-02-18 MED ORDER — ATORVASTATIN CALCIUM 20 MG PO TABS: 20.0000 mg | ORAL_TABLET | Freq: Every day | ORAL | 3 refills | Status: DC

## 2020-02-18 MED FILL — DOXYCYCLINE HYCLATE 100 MG: 100 | 10 days supply | Qty: 20 | Fill #0

## 2020-02-18 MED FILL — ?ATORVASTATIN 20 MG TABLET: 20 | 30 days supply | Qty: 30 | Fill #0

## 2020-02-18 MED FILL — ?CARVEDILOL 25 MG TABLET: 25 | 30 days supply | Qty: 60 | Fill #0

## 2020-02-18 NOTE — Patient Instructions (Signed)
Increase Coreg to 25 mg twice daily, you can take 2 tablets twice daily of your original prescription until the new bottle is filled and then you will take 1 tablet twice daily  No change in other medications at this time  Begin doxycycline 1 tablet twice daily which is an antibiotic  Come in for a nurse visit 3 times weekly for wound checks of your lower extremities and dressing changes until we get you into the wound care center  Dressing supplies were sent home to change daily  Do not shower at this time please just do upper body bathing sitting on the side of the bathtub  Please try to minimize her activity outside of the home for now keep your legs elevated  If you run a fever or have increased pain in the lower extremities come to the emergency room immediately  Return to see Dr. Delford Field in 1 week this will be aligned with one of the nurse visits for dressing change  We are trying to get you moved up and scheduled with the wound care center sooner

## 2020-02-18 NOTE — Assessment & Plan Note (Signed)
Uncontrolled hypertension we will plan to increase Coreg to 25 mg twice daily and continue amlodipine and hydralazine as prescribed

## 2020-02-18 NOTE — Assessment & Plan Note (Addendum)
Again will refer to cardiology and continue furosemide Increase beta-blocker and attempt to control blood pressure

## 2020-02-18 NOTE — Telephone Encounter (Cosign Needed)
Call placed to [facility] to check on status of referral. Spoke to [facility] and explained the need for the patient to be seen as soon as possible. She said that she tried to reach the patient last week but the numbers were not working so the referral was cancelled. Informed her that the patient was in the clinic and this CM would confirm his phone number.  She scheduled him for an appointment 02/24/2020 @ 1315.  She said that they need to call to confirm the appointment with him and if he does not answer, they will cancel the appointment.    Met with the patient when he was in the clinic and explained above information. He confirmed his phone number # [phone]. Epic updated. Jania updated.  The phone numbers in Epic were not correct.   He said that he always has his phone with him and will confirm the appointment when they call.  This CM spoke with Cheryle Horsfall, [place] about medications that patient needs to pick up today: carvedilol, doxycycline and atorvastatin. He does not have any money and has already used his one time free fill.  Claiborne Billings completed that application for [facility] with him and he will be able to get those medications free of charge through [facility].  He was instructed to wait in the clinic for them to be filled this afternoon.  He said that he has completed the application for [facility] [employer] and [employer] and needs to schedule an appointment with the [facility].   Arranged an appt for 02/26/2020.
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
## 2020-02-18 NOTE — Assessment & Plan Note (Signed)
Bilateral lower extremity edema with open wounds and early cellulitis the wounds are continue to drain  We will redress with nonstick gauze and kerlex and wrap with Ace wraps and we have called to see if we get this patient in sooner to the wound care clinic  He would benefit from an Unna boot to both lower extremities  We will administer a 10-day course of doxycycline 100 mg twice daily for early cellulitis  We will obtain metabolic profile and blood counts at this visit

## 2020-02-19 ENCOUNTER — Other Ambulatory Visit: Payer: Self-pay | Admitting: Critical Care Medicine

## 2020-02-19 DIAGNOSIS — N183 Chronic kidney disease, stage 3 unspecified: Secondary | ICD-10-CM | POA: Insufficient documentation

## 2020-02-19 DIAGNOSIS — N1831 Chronic kidney disease, stage 3a: Secondary | ICD-10-CM | POA: Insufficient documentation

## 2020-02-19 DIAGNOSIS — N1832 Chronic kidney disease, stage 3b: Secondary | ICD-10-CM | POA: Insufficient documentation

## 2020-02-19 LAB — CBC WITH DIFFERENTIAL/PLATELET
Basophils Absolute: 0 10*3/uL (ref 0.0–0.2)
Basos: 1 %
EOS (ABSOLUTE): 0.2 10*3/uL (ref 0.0–0.4)
Eos: 4 %
Hematocrit: 36.5 % — ABNORMAL LOW (ref 37.5–51.0)
Hemoglobin: 11.3 g/dL — ABNORMAL LOW (ref 13.0–17.7)
Immature Grans (Abs): 0 10*3/uL (ref 0.0–0.1)
Immature Granulocytes: 0 %
Lymphocytes Absolute: 1.8 10*3/uL (ref 0.7–3.1)
Lymphs: 38 %
MCH: 22.3 pg — ABNORMAL LOW (ref 26.6–33.0)
MCHC: 31 g/dL — ABNORMAL LOW (ref 31.5–35.7)
MCV: 72 fL — ABNORMAL LOW (ref 79–97)
Monocytes Absolute: 0.6 10*3/uL (ref 0.1–0.9)
Monocytes: 13 %
Neutrophils Absolute: 2.1 10*3/uL (ref 1.4–7.0)
Neutrophils: 44 %
Platelets: 267 10*3/uL (ref 150–450)
RBC: 5.06 x10E6/uL (ref 4.14–5.80)
RDW: 16 % — ABNORMAL HIGH (ref 11.6–15.4)
WBC: 4.8 10*3/uL (ref 3.4–10.8)

## 2020-02-19 LAB — BASIC METABOLIC PANEL
BUN/Creatinine Ratio: 14 (ref 10–24)
BUN: 26 mg/dL (ref 8–27)
CO2: 22 mmol/L (ref 20–29)
Calcium: 9.5 mg/dL (ref 8.6–10.2)
Chloride: 103 mmol/L (ref 96–106)
Creatinine, Ser: 1.88 mg/dL — ABNORMAL HIGH (ref 0.76–1.27)
GFR calc Af Amer: 44 mL/min/{1.73_m2} — ABNORMAL LOW (ref >59)
GFR calc non Af Amer: 38 mL/min/{1.73_m2} — ABNORMAL LOW (ref >59)
Glucose: 110 mg/dL — ABNORMAL HIGH (ref 65–99)
Potassium: 4.1 mmol/L (ref 3.5–5.2)
Sodium: 141 mmol/L (ref 134–144)

## 2020-02-24 ENCOUNTER — Other Ambulatory Visit: Payer: Self-pay

## 2020-02-24 ENCOUNTER — Encounter (HOSPITAL_BASED_OUTPATIENT_CLINIC_OR_DEPARTMENT_OTHER): Payer: Medicaid Other | Attending: Physician Assistant | Admitting: Physician Assistant

## 2020-02-24 DIAGNOSIS — I890 Lymphedema, not elsewhere classified: Secondary | ICD-10-CM | POA: Insufficient documentation

## 2020-02-24 DIAGNOSIS — I5042 Chronic combined systolic (congestive) and diastolic (congestive) heart failure: Secondary | ICD-10-CM | POA: Diagnosis not present

## 2020-02-24 DIAGNOSIS — L97812 Non-pressure chronic ulcer of other part of right lower leg with fat layer exposed: Secondary | ICD-10-CM | POA: Insufficient documentation

## 2020-02-24 DIAGNOSIS — I10 Essential (primary) hypertension: Secondary | ICD-10-CM | POA: Insufficient documentation

## 2020-02-24 DIAGNOSIS — L97822 Non-pressure chronic ulcer of other part of left lower leg with fat layer exposed: Secondary | ICD-10-CM | POA: Insufficient documentation

## 2020-02-24 DIAGNOSIS — I130 Hypertensive heart and chronic kidney disease with heart failure and stage 1 through stage 4 chronic kidney disease, or unspecified chronic kidney disease: Secondary | ICD-10-CM | POA: Insufficient documentation

## 2020-02-24 DIAGNOSIS — I872 Venous insufficiency (chronic) (peripheral): Secondary | ICD-10-CM | POA: Diagnosis not present

## 2020-02-24 DIAGNOSIS — N183 Chronic kidney disease, stage 3 unspecified: Secondary | ICD-10-CM | POA: Diagnosis not present

## 2020-02-25 NOTE — Progress Notes (Signed)
[name], [name] ([id number]) Visit Report for 02/24/2020 Abuse/Suicide Risk Screen Details Patient Name: Date of Service: [name], [name] 02/24/2020 1:15 PM Medical Record Number: [id number] Patient Account Number: [id number] Date of Birth/Sex: Treating RN: 02/24/1961 (59 y.o. Judie Petit) Yevonne Pax Primary Care Marla Pouliot: PA Zenovia Jordan, NO Other Clinician: Referring Aariana Shankland: Treating Naila Elizondo/Extender: Meribeth Mattes, PA TRICK Weeks in Treatment: 0 Abuse/Suicide Risk Screen Items Answer ABUSE RISK SCREEN: Has anyone close to you tried to hurt or harm you recentlyo No Do you feel uncomfortable with anyone in your familyo No Has anyone forced you do things that you didnt want to doo No Electronic Signature(s) Signed: 02/25/2020 5:18:12 PM By: Yevonne Pax RN Entered By: Yevonne Pax on 02/24/2020 14:41:22 -------------------------------------------------------------------------------- Activities of Daily Living Details Patient Name: Date of Service: [name], [name] 02/24/2020 1:15 PM Medical Record Number: [id number] Patient Account Number: [id number] Date of Birth/Sex: Treating RN: 1960-08-01 (59 y.o. Judie Petit) Yevonne Pax Primary Care Demetric Parslow: PA Zenovia Jordan, NO Other Clinician: Referring Cash Duce: Treating Tonji Elliff/Extender: Meribeth Mattes, PA TRICK Weeks in Treatment: 0 Activities of Daily Living Items Answer Activities of Daily Living (Please select one for each item) Drive Automobile Completely Able T Medications ake Completely Able Use T elephone Completely Able Care for Appearance Completely Able Use T oilet Completely Able Bath / Shower Completely Able Dress Self Completely Able Feed Self Completely Able Walk Completely Able Get In / Out Bed Completely Able Housework Completely Able Prepare Meals Completely Able Handle Money Completely Able Shop for Self Completely Able Electronic Signature(s) Signed: 02/25/2020 5:18:12 PM By: Yevonne Pax RN Entered By: Yevonne Pax on  02/24/2020 14:41:48 -------------------------------------------------------------------------------- Education Screening Details Patient Name: Date of Service: [name], [name] 02/24/2020 1:15 PM Medical Record Number: [id number] Patient Account Number: [id number] Date of Birth/Sex: Treating RN: 02/16/1961 (59 y.o. Judie Petit) Yevonne Pax Primary Care Lashana Spang: PA Zenovia Jordan, NO Other Clinician: Referring Nameer Summer: Treating Hines Kloss/Extender: Meribeth Mattes, PA TRICK Weeks in Treatment: 0 Primary Learner Assessed: Patient Learning Preferences/Education Level/Primary Language Learning Preference: Explanation Highest Education Level: High School Preferred Language: English Cognitive Barrier Language Barrier: No Translator Needed: No Memory Deficit: No Emotional Barrier: No Cultural/Religious Beliefs Affecting Medical Care: No Physical Barrier Impaired Vision: Yes Glasses Impaired Hearing: No Decreased Hand dexterity: No Knowledge/Comprehension Knowledge Level: Medium Comprehension Level: Medium Ability to understand written instructions: Medium Ability to understand verbal instructions: Medium Motivation Anxiety Level: Anxious Cooperation: Cooperative Education Importance: Acknowledges Need Interest in Health Problems: Asks Questions Perception: Coherent Willingness to Engage in Self-Management Medium Activities: Readiness to Engage in Self-Management Medium Activities: Electronic Signature(s) Signed: 02/25/2020 5:18:12 PM By: Yevonne Pax RN Entered By: Yevonne Pax on 02/24/2020 14:42:18 -------------------------------------------------------------------------------- Fall Risk Assessment Details Patient Name: Date of Service: [name], [name] 02/24/2020 1:15 PM Medical Record Number: [id number] Patient Account Number: [id number] Date of Birth/Sex: Treating RN: February 14, 1961 (59 y.o. Judie Petit) Yevonne Pax Primary Care Nkenge Sonntag: PA Zenovia Jordan, NO Other Clinician: Referring  Jhovani Griswold: Treating Ronneisha Jett/Extender: Meribeth Mattes, PA TRICK Weeks in Treatment: 0 Fall Risk Assessment Items Have you had 2 or more falls in the last 12 monthso 0 No Have you had any fall that resulted in injury in the last 12 monthso 0 No FALLS RISK SCREEN History of falling - immediate or within 3 months 0 No Secondary diagnosis (Do you have 2 or more medical diagnoseso) 0 No Ambulatory aid None/bed rest/wheelchair/nurse 0 No Crutches/cane/walker 0 No Furniture 0 No Intravenous therapy Access/Saline/Heparin Lock 0 No Gait/Transferring Normal/ bed rest/ wheelchair 0 No Weak (short  steps with or without shuffle, stooped but able to lift head while walking, may seek 0 No support from furniture) Impaired (short steps with shuffle, may have difficulty arising from chair, head down, impaired 0 No balance) Mental Status Oriented to own ability 0 No Electronic Signature(s) Signed: 02/25/2020 5:18:12 PM By: Yevonne Pax RN Entered By: Yevonne Pax on 02/24/2020 14:42:23 -------------------------------------------------------------------------------- Foot Assessment Details Patient Name: Date of Service: [name], [name] 02/24/2020 1:15 PM Medical Record Number: [id number] Patient Account Number: [id number] Date of Birth/Sex: Treating RN: [birth year] (59 y.o. Judie Petit) Yevonne Pax Primary Care Dnaiel Voller: PA Zenovia Jordan, NO Other Clinician: Referring Tyree Vandruff: Treating Luisa Louk/Extender: Meribeth Mattes, PA TRICK Weeks in Treatment: 0 Foot Assessment Items Site Locations + = Sensation present, - = Sensation absent, C = Callus, U = Ulcer R = Redness, W = Warmth, M = Maceration, PU = Pre-ulcerative lesion F = Fissure, S = Swelling, D = Dryness Assessment Right: Left: Other Deformity: No No Prior Foot Ulcer: No No Prior Amputation: No No Charcot Joint: No No Ambulatory Status: Ambulatory Without Help Gait: Steady Electronic Signature(s) Signed: 02/25/2020 5:18:12 PM By: Yevonne Pax RN Entered By: Yevonne Pax on 02/24/2020 14:43:11 -------------------------------------------------------------------------------- Nutrition Risk Screening Details Patient Name: Date of Service: [name], [name] 02/24/2020 1:15 PM Medical Record Number: [id number] Patient Account Number: [id number] Date of Birth/Sex: Treating RN: 1960-04-24 (59 y.o. Judie Petit) Yevonne Pax Primary Care Marshel Golubski: PA Zenovia Jordan, NO Other Clinician: Referring Kyerra Vargo: Treating Rayyan Burley/Extender: Meribeth Mattes, PA TRICK Weeks in Treatment: 0 Height (in): 69 Weight (lbs): 228 Body Mass Index (BMI): 33.7 Nutrition Risk Screening Items Score Screening NUTRITION RISK SCREEN: I have an illness or condition that made me change the kind and/or amount of food I eat 0 No I eat fewer than two meals per day 0 No I eat few fruits and vegetables, or milk products 0 No I have three or more drinks of beer, liquor or wine almost every day 0 No I have tooth or mouth problems that make it hard for me to eat 0 No I don't always have enough money to buy the food I need 0 No I eat alone most of the time 0 No I take three or more different prescribed or over-the-counter drugs a day 1 Yes Without wanting to, I have lost or gained 10 pounds in the last six months 0 No I am not always physically able to shop, cook and/or feed myself 0 No Nutrition Protocols Good Risk Protocol 0 No interventions needed Moderate Risk Protocol High Risk Proctocol Risk Level: Good Risk Score: 1 Electronic Signature(s) Signed: 02/25/2020 5:18:12 PM By: Yevonne Pax RN Entered By: Yevonne Pax on 02/24/2020 14:42:41

## 2020-02-25 NOTE — Progress Notes (Signed)
Shawn LeydenRAY, Lamont ([id number]) Visit Report for 02/24/2020 Allergy List Details Patient Name: Date of Service: [name], [place] 02/24/2020 1:15 PM Medical Record Number: [id number] Patient Account Number: [id number] Date of Birth/Sex: Treating RN: Jan 21, 1961 (59 Small.o. Judie PetitM) Yevonne PaxEpps, Carrie Primary Care [name]: PA Zenovia JordanIENT, NO Other Clinician: Referring Zulma Court: Treating Wren Pryce/Extender: Meribeth MattesStone III, Hoyt WRIGHT, PA TRICK Weeks in Treatment: 0 Allergies Active Allergies latex Allergy Notes Electronic Signature(s) Signed: 02/25/2020 5:18:12 PM By: Yevonne PaxEpps, Carrie RN Entered By: Yevonne PaxEpps, Carrie on 02/24/2020 14:18:38 -------------------------------------------------------------------------------- Arrival Information Details Patient Name: Date of Service: [name], [name] 02/24/2020 1:15 PM Medical Record Number: [id number] Patient Account Number: [id number] Date of Birth/Sex: Treating RN: Jan 21, 1961 (59 Small.o. Judie PetitM) Yevonne PaxEpps, Carrie Primary Care Ulysess Witz: PA Zenovia JordanIENT, NO Other Clinician: Referring Avion Kutzer: Treating Dalores Weger/Extender: Meribeth MattesStone III, Hoyt WRIGHT, PA TRICK Weeks in Treatment: 0 Visit Information Patient Arrived: Ambulatory Arrival Time: 14:08 Accompanied By: self Transfer Assistance: None Patient Identification Verified: Yes Secondary Verification Process Completed: Yes Patient Requires Transmission-Based Precautions: No Patient Has Alerts: Yes Patient Alerts: ABI non compressible bil. Electronic Signature(s) Signed: 02/24/2020 5:46:54 PM By: Zandra AbtsLynch, Shatara RN, BSN Entered By: Zandra AbtsLynch, Shatara on 02/24/2020 15:28:42 -------------------------------------------------------------------------------- Clinic Level of Care Assessment Details Patient Name: Date of Service: [name], [name] 02/24/2020 1:15 PM Medical Record Number: [id number] Patient Account Number: [id number] Date of Birth/Sex: Treating RN: Jan 21, 1961 (59 Small.o. Elizebeth KollerM) Lynch, Shatara Primary Care Melton Walls: PA Zenovia JordanIENT, NO Other Clinician: Referring  Makyi Ledo: Treating Jerzee Jerome/Extender: Meribeth MattesStone III, Hoyt WRIGHT, PA TRICK Weeks in Treatment: 0 Clinic Level of Care Assessment Items TOOL 1 Quantity Score X- 1 0 Use when EandM and Procedure is performed on INITIAL visit ASSESSMENTS - Nursing Assessment / Reassessment X- 1 20 General Physical Exam (combine w/ comprehensive assessment (listed just below) when performed on new pt. evals) X- 1 25 Comprehensive Assessment (HX, ROS, Risk Assessments, Wounds Hx, etc.) ASSESSMENTS - Wound and Skin Assessment / Reassessment []  - 0 Dermatologic / Skin Assessment (not related to wound area) ASSESSMENTS - Ostomy and/or Continence Assessment and Care []  - 0 Incontinence Assessment and Management []  - 0 Ostomy Care Assessment and Management (repouching, etc.) PROCESS - Coordination of Care X - Simple Patient / Family Education for ongoing care 1 15 []  - 0 Complex (extensive) Patient / Family Education for ongoing care X- 1 10 Staff obtains ChiropractorConsents, Records, T Results / Process Orders est []  - 0 Staff telephones HHA, Nursing Homes / Clarify orders / etc []  - 0 Routine Transfer to another Facility (non-emergent condition) []  - 0 Routine Hospital Admission (non-emergent condition) X- 1 15 New Admissions / Manufacturing engineernsurance Authorizations / Ordering NPWT Apligraf, etc. , []  - 0 Emergency Hospital Admission (emergent condition) PROCESS - Special Needs []  - 0 Pediatric / Minor Patient Management []  - 0 Isolation Patient Management []  - 0 Hearing / Language / Visual special needs []  - 0 Assessment of Community assistance (transportation, D/C planning, etc.) []  - 0 Additional assistance / Altered mentation []  - 0 Support Surface(s) Assessment (bed, cushion, seat, etc.) INTERVENTIONS - Miscellaneous []  - 0 External ear exam []  - 0 Patient Transfer (multiple staff / Nurse, adultHoyer Lift / Similar devices) []  - 0 Simple Staple / Suture removal (25 or less) []  - 0 Complex Staple / Suture removal (26  or more) []  - 0 Hypo/Hyperglycemic Management (do not check if billed separately) X- 1 15 Ankle / Brachial Index (ABI) - do not check if billed separately Has the patient been seen at the hospital within the last three years: Yes Total  Score: 100 Level Of Care: New/Established - Level 3 Electronic Signature(s) Signed: 02/24/2020 5:46:54 PM By: Zandra Abts RN, BSN Entered By: Zandra Abts on 02/24/2020 17:40:45 -------------------------------------------------------------------------------- Compression Therapy Details Patient Name: Date of Service: [name], [name] 02/24/2020 1:15 PM Medical Record Number: [id number] Patient Account Number: [id number] Date of Birth/Sex: Treating RN: 1960-12-02 (23 Small.o. [name] Primary Care Nikan Ellingson: PA Zenovia Jordan, NO Other Clinician: Referring Emeterio Balke: Treating Marceline Napierala/Extender: Meribeth Mattes, PA TRICK Weeks in Treatment: 0 Compression Therapy Performed for Wound Assessment: Wound #1 Left,Anterior Lower Leg Performed By: Clinician Zandra Abts, RN Compression Type: Three Layer Post Procedure Diagnosis Same as Pre-procedure Electronic Signature(s) Signed: 02/24/2020 5:46:54 PM By: Zandra Abts RN, BSN Entered By: Zandra Abts on 02/24/2020 15:36:12 -------------------------------------------------------------------------------- Compression Therapy Details Patient Name: Date of Service: [name], [name] 02/24/2020 1:15 PM Medical Record Number: [id number] Patient Account Number: [id number] Date of Birth/Sex: Treating RN: 1960/11/14 (46 Small.o. [name] Primary Care Shannen Flansburg: PA Zenovia Jordan, NO Other Clinician: Referring Deloss Amico: Treating Jaelyn Bourgoin/Extender: Meribeth Mattes, PA TRICK Weeks in Treatment: 0 Compression Therapy Performed for Wound Assessment: Wound #2 Right,Circumferential Lower Leg Performed By: Clinician Zandra Abts, RN Compression Type: Three Layer Post Procedure Diagnosis Same as  Pre-procedure Electronic Signature(s) Signed: 02/24/2020 5:46:54 PM By: Zandra Abts RN, BSN Entered By: Zandra Abts on 02/24/2020 15:36:12 -------------------------------------------------------------------------------- Encounter Discharge Information Details Patient Name: Date of Service: [name], [name] 02/24/2020 1:15 PM Medical Record Number: [id number] Patient Account Number: [id number] Date of Birth/Sex: Treating RN: 1960/05/16 (36 Small.o. [name] Primary Care Lareen Mullings: PA Zenovia Jordan, NO Other Clinician: Referring Orissa Arreaga: Treating Maday Guarino/Extender: Meribeth Mattes, PA TRICK Weeks in Treatment: 0 Encounter Discharge Information Items Discharge Condition: Stable Ambulatory Status: Ambulatory Discharge Destination: Home Transportation: Private Auto Accompanied By: self Schedule Follow-up Appointment: Yes Clinical Summary of Care: Patient Declined Electronic Signature(s) Signed: 02/24/2020 5:18:41 PM By: Fonnie Mu RN Entered By: Fonnie Mu on 02/24/2020 15:54:08 -------------------------------------------------------------------------------- Lower Extremity Assessment Details Patient Name: Date of Service: [name], [name] 02/24/2020 1:15 PM Medical Record Number: [id number] Patient Account Number: [id number] Date of Birth/Sex: Treating RN: [birth year] (59 Small.o. Judie Petit) Yevonne Pax Primary Care Henretter Piekarski: PA Zenovia Jordan, NO Other Clinician: Referring Davie Sagona: Treating Reika Callanan/Extender: Meribeth Mattes, PA TRICK Weeks in Treatment: 0 Edema Assessment Assessed: [Left: No] [Right: No] E[Left: dema] [Right: :] Calf Left: Right: Point of Measurement: 44 cm From Medial Instep 39 cm 38.5 cm Ankle Left: Right: Point of Measurement: 12 cm From Medial Instep 25 cm 26 cm Notes non compressable Electronic Signature(s) Signed: 02/25/2020 5:18:12 PM By: Yevonne Pax RN Entered By: Yevonne Pax on 02/24/2020  14:50:46 -------------------------------------------------------------------------------- Multi-Disciplinary Care Plan Details Patient Name: Date of Service: [name], [name] 02/24/2020 1:15 PM Medical Record Number: [id number] Patient Account Number: [id number] Date of Birth/Sex: Treating RN: [birth year] (59 Small.o. [name] Primary Care Kieren Ricci: PA Zenovia Jordan, NO Other Clinician: Referring Juliet Vasbinder: Treating Lashonne Shull/Extender: Meribeth Mattes, PA TRICK Weeks in Treatment: 0 Active Inactive Venous Leg Ulcer Nursing Diagnoses: Actual venous Insuffiency (use after diagnosis is confirmed) Knowledge deficit related to disease process and management Goals: Patient will maintain optimal edema control Date Initiated: 02/24/2020 Target Resolution Date: 03/27/2020 Goal Status: Active Patient/caregiver will verbalize understanding of disease process and disease management Date Initiated: 02/24/2020 Target Resolution Date: 03/27/2020 Goal Status: Active Interventions: Assess peripheral edema status every visit. Compression as ordered Provide education on venous insufficiency Notes: Wound/Skin Impairment Nursing Diagnoses: Impaired tissue integrity Knowledge deficit related to ulceration/compromised skin integrity Goals:  Patient/caregiver will verbalize understanding of skin care regimen Date Initiated: 02/24/2020 Target Resolution Date: 03/27/2020 Goal Status: Active Interventions: Assess patient/caregiver ability to obtain necessary supplies Assess patient/caregiver ability to perform ulcer/skin care regimen upon admission and as needed Assess ulceration(s) every visit Provide education on ulcer and skin care Notes: Electronic Signature(s) Signed: 02/24/2020 5:46:54 PM By: Zandra Abts RN, BSN Entered By: Zandra Abts on 02/24/2020 17:40:10 -------------------------------------------------------------------------------- Pain Assessment Details Patient Name: Date  of Service: [name], [name] 02/24/2020 1:15 PM Medical Record Number: [id number] Patient Account Number: [id number] Date of Birth/Sex: Treating RN: [birth year] (59 Small.o. Judie Petit) Yevonne Pax Primary Care Angellynn Kimberlin: PA Zenovia Jordan, NO Other Clinician: Referring Dezmond Downie: Treating Shaneca Orne/Extender: Meribeth Mattes, PA TRICK Weeks in Treatment: 0 Active Problems Location of Pain Severity and Description of Pain Patient Has Paino No Site Locations Pain Management and Medication Current Pain Management: Electronic Signature(s) Signed: 02/25/2020 5:18:12 PM By: Yevonne Pax RN Entered By: Yevonne Pax on 02/24/2020 14:53:58 -------------------------------------------------------------------------------- Patient/Caregiver Education Details Patient Name: Date of Service: Shawn Medico 11/22/2021andnbsp1:15 PM Medical Record Number: [id number] Patient Account Number: [id number] Date of Birth/Gender: Treating RN: 1960-09-11 (24 Small.o. [name] Primary Care Physician: PA Zenovia Jordan, NO Other Clinician: Referring Physician: Treating Physician/Extender: Meribeth Mattes, PA TRICK Weeks in Treatment: 0 Education Assessment Education Provided To: Patient Education Topics Provided Venous: Methods: Explain/Verbal Responses: State content correctly Wound/Skin Impairment: Methods: Explain/Verbal Responses: State content correctly Electronic Signature(s) Signed: 02/24/2020 5:46:54 PM By: Zandra Abts RN, BSN Entered By: Zandra Abts on 02/24/2020 17:40:20 -------------------------------------------------------------------------------- Wound Assessment Details Patient Name: Date of Service: [name], [name] 02/24/2020 1:15 PM Medical Record Number: [id number] Patient Account Number: [id number] Date of Birth/Sex: Treating RN: [birth year] (59 Small.o. [name] Primary Care Dondrell Loudermilk: PA TIENT, NO Other Clinician: Referring Clarrissa Shimkus: Treating Mallisa Alameda/Extender: Meribeth Mattes, PA TRICK Weeks in Treatment: 0 Wound Status Wound Number: 1 Primary Etiology: Lymphedema Wound Location: Left Lower Leg Wound Status: Open Wounding Event: Gradually Appeared Comorbid History: Congestive Heart Failure, Hypertension Date Acquired: 01/03/2020 Weeks Of Treatment: 0 Clustered Wound: No Wound Measurements Length: (cm) 2.5 Width: (cm) 6.2 Depth: (cm) 0.1 Area: (cm) 12.174 Volume: (cm) 1.217 % Reduction in Area: 95.3% % Reduction in Volume: 95.3% Epithelialization: None Tunneling: No Undermining: No Wound Description Classification: Full Thickness Without Exposed Support Structures Exudate Amount: Medium Exudate Type: Serous Exudate Color: amber Foul Odor After Cleansing: No Slough/Fibrino Yes Wound Bed Granulation Amount: Small (1-33%) Exposed Structure Granulation Quality: Pink Fascia Exposed: No Necrotic Amount: Large (67-100%) Fat Layer (Subcutaneous Tissue) Exposed: Yes Necrotic Quality: Adherent Slough Tendon Exposed: No Muscle Exposed: No Joint Exposed: No Bone Exposed: No Electronic Signature(s) Signed: 02/24/2020 5:46:54 PM By: Zandra Abts RN, BSN Entered By: Zandra Abts on 02/24/2020 15:31:00 -------------------------------------------------------------------------------- Wound Assessment Details Patient Name: Date of Service: [name], [name] 02/24/2020 1:15 PM Medical Record Number: [id number] Patient Account Number: [id number] Date of Birth/Sex: Treating RN: 08/17/1960 (37 Small.o. [name] Primary Care Chareese Sergent: PA TIENT, NO Other Clinician: Referring Lumi Winslett: Treating Reymundo Winship/Extender: Meribeth Mattes, PA TRICK Weeks in Treatment: 0 Wound Status Wound Number: 2 Primary Etiology: Lymphedema Wound Location: Right Lower Leg Wound Status: Open Wounding Event: Gradually Appeared Comorbid History: Congestive Heart Failure, Hypertension Date Acquired: 01/03/2020 Weeks Of Treatment: 0 Clustered Wound:  No Wound Measurements Length: (cm) 9.9 Width: (cm) 36 Depth: (cm) 0.1 Area: (cm) 279.916 Volume: (cm) 27.992 % Reduction in Area: -2199.3% % Reduction in Volume: -2200.1% Epithelialization: None Tunneling: No Undermining: No Wound Description  Classification: Full Thickness Without Exposed Support Structures Exudate Amount: Medium Exudate Type: Serosanguineous Exudate Color: red, brown Foul Odor After Cleansing: No Slough/Fibrino Yes Wound Bed Granulation Amount: Small (1-33%) Exposed Structure Granulation Quality: Pink Fascia Exposed: No Necrotic Amount: Large (67-100%) Fat Layer (Subcutaneous Tissue) Exposed: Yes Necrotic Quality: Adherent Slough Tendon Exposed: No Muscle Exposed: No Joint Exposed: No Bone Exposed: No Electronic Signature(s) Signed: 02/24/2020 5:46:54 PM By: Zandra Abts RN, BSN Entered By: Zandra Abts on 02/24/2020 15:31:01 -------------------------------------------------------------------------------- Vitals Details Patient Name: Date of Service: [name], [name] 02/24/2020 1:15 PM Medical Record Number: [id number] Patient Account Number: [id number] Date of Birth/Sex: Treating RN: Nov 24, 1960 (59 Small.o. Judie Petit) Yevonne Pax Primary Care Nary Sneed: PA Zenovia Jordan, NO Other Clinician: Referring Jaman Aro: Treating Susanna Benge/Extender: Meribeth Mattes, PA TRICK Weeks in Treatment: 0 Vital Signs Time Taken: 14:16 Temperature (F): 98.5 Height (in): 69 Pulse (bpm): 73 Source: Stated Respiratory Rate (breaths/min): 20 Weight (lbs): 228 Blood Pressure (mmHg): 157/96 Source: Stated Reference Range: 80 - 120 mg / dl Body Mass Index (BMI): 33.7 Electronic Signature(s) Signed: 02/25/2020 5:18:12 PM By: Yevonne Pax RN Entered By: Yevonne Pax on 02/24/2020 14:18:00

## 2020-02-26 ENCOUNTER — Ambulatory Visit: Payer: Self-pay

## 2020-02-26 NOTE — Progress Notes (Signed)
ARTHER, HEISLER ([id number]) Visit Report for 02/24/2020 Chief Complaint Document Details Patient Name: Date of Service: [name], [name] 02/24/2020 1:15 PM Medical Record Number: [id number] Patient Account Number: [id number] Date of Birth/Sex: Treating RN: 1960/08/15 (59 y.o. [name] Primary Care Provider: PA Zenovia Jordan, NO Other Clinician: Referring Provider: Treating Provider/Extender: Meribeth Mattes, PA TRICK Weeks in Treatment: 0 Information Obtained from: Patient Chief Complaint Bilateral Leg Ulcers Electronic Signature(s) Signed: 02/24/2020 3:22:37 PM By: Lenda Kelp PA-C Entered By: Lenda Kelp on 02/24/2020 15:22:37 -------------------------------------------------------------------------------- HPI Details Patient Name: Date of Service: Shawn, Small 02/24/2020 1:15 PM Medical Record Number: [id number] Patient Account Number: [id number] Date of Birth/Sex: Treating RN: [birth year] (59 y.o. [name] Primary Care Provider: PA Zenovia Jordan, NO Other Clinician: Referring Provider: Treating Provider/Extender: Meribeth Mattes, PA TRICK Weeks in Treatment: 0 History of Present Illness HPI Description: 02/24/2020 on evaluation today patient presents with obvious chronic lymphedema of the bilateral lower extremities. Fortunately there is no signs of active infection at this time which is great news. With that being said the patient does have a recent treatment with doxycycline 100 mg for 10 days which he has completed. He does have a history of chronic kidney disease stage III as documented in his chart. He also has chronic venous insufficiency with lower extremity lymphedema noted. He has congestive heart failure and hypertension. He tells me currently that he was unaware of the kidney disease before he mentioned this today. He also tells me that his primary care provider had told him to quit taking showers based on what was going on with his legs at this time.  Fortunately there is no signs of active infection at this time which is great news. No fevers, chills, nausea, vomiting, or diarrhea. Electronic Signature(s) Signed: 02/24/2020 3:37:41 PM By: Lenda Kelp PA-C Entered By: Lenda Kelp on 02/24/2020 15:37:41 -------------------------------------------------------------------------------- Physical Exam Details Patient Name: Date of Service: Shawn, Small 02/24/2020 1:15 PM Medical Record Number: [id number] Patient Account Number: [id number] Date of Birth/Sex: Treating RN: 1960-05-19 (59 y.o. [name] Primary Care Provider: PA Zenovia Jordan, NO Other Clinician: Referring Provider: Treating Provider/Extender: Meribeth Mattes, PA TRICK Weeks in Treatment: 0 Constitutional patient is hypertensive.. pulse regular and within target range for patient.Marland Kitchen respirations regular, non-labored and within target range for patient.Marland Kitchen temperature within target range for patient.. Well-nourished and well-hydrated in no acute distress. Eyes conjunctiva clear no eyelid edema noted. pupils equal round and reactive to light and accommodation. Ears, Nose, Mouth, and Throat no gross abnormality of ear auricles or external auditory canals. normal hearing noted during conversation. mucus membranes moist. Respiratory normal breathing without difficulty. Cardiovascular 2+ dorsalis pedis/posterior tibialis pulses. 2+ pitting edema of the bilateral lower extremities. Musculoskeletal normal gait and posture. no significant deformity or arthritic changes, no loss or range of motion, no clubbing. Psychiatric this patient is able to make decisions and demonstrates good insight into disease process. Alert and Oriented x 3. pleasant and cooperative. Notes Upon inspection patient's wound bed actually showed signs of good epithelization at this time. There does not appear to be any evidence of active infection he mainly has more drainage on the posterior aspect of  the right leg left leg is very minimal at this point. There is no signs of active infection at this time which is great news. No fevers, chills, nausea, vomiting, or diarrhea. Electronic Signature(s) Signed: 02/24/2020 3:38:42 PM By: Lenda Kelp PA-C Entered By: Lenda Kelp  on 02/24/2020 15:38:41 -------------------------------------------------------------------------------- Physician Orders Details Patient Name: Date of Service: Shawn, Small 02/24/2020 1:15 PM Medical Record Number: [id number] Patient Account Number: [id number] Date of Birth/Sex: Treating RN: 07-28-1960 (59 y.o. [name] Primary Care Provider: PA TIENT, NO Other Clinician: Referring Provider: Treating Provider/Extender: Meribeth Mattes, PA TRICK Weeks in Treatment: 0 Verbal / Phone Orders: No Diagnosis Coding ICD-10 Coding Code Description I89.0 Lymphedema, not elsewhere classified I87.2 Venous insufficiency (chronic) (peripheral) L97.822 Non-pressure chronic ulcer of other part of left lower leg with fat layer exposed L97.812 Non-pressure chronic ulcer of other part of right lower leg with fat layer exposed N18.30 Chronic kidney disease, stage 3 unspecified I50.42 Chronic combined systolic (congestive) and diastolic (congestive) heart failure I10 Essential (primary) hypertension Follow-up Appointments ppointment in 1 week. - Tuesday Return A Dressing Change Frequency Do not change entire dressing for one week. - both legs Skin Barriers/Peri-Wound Care Moisturizing lotion TCA Cream or Ointment - liberally, mixed with lotion Wound Cleansing May shower with protection. - use cast protector Primary Wound Dressing Wound #1 Left,Anterior Lower Leg Other: - Triamcinolone Wound #2 Right,Circumferential Lower Leg Calcium Alginate with Silver Secondary Dressing Wound #1 Left,Anterior Lower Leg ABD pad Wound #2 Right,Circumferential Lower Leg ABD pad Edema Control 3 Layer Compression  System - Bilateral Avoid standing for long periods of time Elevate legs to the level of the heart or above for 30 minutes daily and/or when sitting, a frequency of: - throughout the day Exercise regularly Electronic Signature(s) Signed: 02/24/2020 5:46:54 PM By: Zandra Abts RN, BSN Signed: 02/26/2020 5:04:05 PM By: Lenda Kelp PA-C Entered By: Zandra Abts on 02/24/2020 15:35:40 -------------------------------------------------------------------------------- Problem List Details Patient Name: Date of Service: JAYDRIEN, WASSENAAR 02/24/2020 1:15 PM Medical Record Number: [id number] Patient Account Number: [id number] Date of Birth/Sex: Treating RN: [birth year] (59 y.o. [name] Primary Care Provider: PA TIENT, NO Other Clinician: Referring Provider: Treating Provider/Extender: Meribeth Mattes, PA TRICK Weeks in Treatment: 0 Active Problems ICD-10 Encounter Code Description Active Date MDM Diagnosis I89.0 Lymphedema, not elsewhere classified 02/24/2020 No Yes I87.2 Venous insufficiency (chronic) (peripheral) 02/24/2020 No Yes L97.822 Non-pressure chronic ulcer of other part of left lower leg with fat layer exposed11/22/2021 No Yes L97.812 Non-pressure chronic ulcer of other part of right lower leg with fat layer 02/24/2020 No Yes exposed N18.30 Chronic kidney disease, stage 3 unspecified 02/24/2020 No Yes I50.42 Chronic combined systolic (congestive) and diastolic (congestive) heart failure 02/24/2020 No Yes I10 Essential (primary) hypertension 02/24/2020 No Yes Inactive Problems Resolved Problems Electronic Signature(s) Signed: 02/24/2020 3:22:19 PM By: Lenda Kelp PA-C Entered By: Lenda Kelp on 02/24/2020 15:22:18 -------------------------------------------------------------------------------- Progress Note Details Patient Name: Date of Service: JAVARES, KAUFHOLD 02/24/2020 1:15 PM Medical Record Number: [id number] Patient Account Number: [id number] Date  of Birth/Sex: Treating RN: Aug 31, 1960 (59 y.o. [name] Primary Care Provider: PA Zenovia Jordan, NO Other Clinician: Referring Provider: Treating Provider/Extender: Meribeth Mattes, PA TRICK Weeks in Treatment: 0 Subjective Chief Complaint Information obtained from Patient Bilateral Leg Ulcers History of Present Illness (HPI) 02/24/2020 on evaluation today patient presents with obvious chronic lymphedema of the bilateral lower extremities. Fortunately there is no signs of active infection at this time which is great news. With that being said the patient does have a recent treatment with doxycycline 100 mg for 10 days which he has completed. He does have a history of chronic kidney disease stage III as documented in his chart. He also has chronic venous insufficiency  with lower extremity lymphedema noted. He has congestive heart failure and hypertension. He tells me currently that he was unaware of the kidney disease before he mentioned this today. He also tells me that his primary care provider had told him to quit taking showers based on what was going on with his legs at this time. Fortunately there is no signs of active infection at this time which is great news. No fevers, chills, nausea, vomiting, or diarrhea. Patient History Information obtained from Patient. Allergies latex Family History Heart Disease - Mother, Hypertension - Mother, Kidney Disease - Mother, No family history of Cancer, Diabetes, Hereditary Spherocytosis, Lung Disease, Seizures, Stroke, Thyroid Problems, Tuberculosis. Social History Never smoker, Marital Status - Married, Alcohol Use - Never, Drug Use - No History, Caffeine Use - Never. Medical History Eyes Denies history of Cataracts, Glaucoma, Optic Neuritis Ear/Nose/Mouth/Throat Denies history of Chronic sinus problems/congestion, Middle ear problems Hematologic/Lymphatic Denies history of Anemia, Hemophilia, Human Immunodeficiency Virus,  Lymphedema, Sickle Cell Disease Respiratory Denies history of Aspiration, Asthma, Chronic Obstructive Pulmonary Disease (COPD), Pneumothorax, Sleep Apnea, Tuberculosis Cardiovascular Patient has history of Congestive Heart Failure, Hypertension Denies history of Angina, Arrhythmia, Coronary Artery Disease, Deep Vein Thrombosis, Hypotension, Myocardial Infarction, Peripheral Arterial Disease, Peripheral Venous Disease, Phlebitis, Vasculitis Gastrointestinal Denies history of Cirrhosis , Colitis, Crohnoos, Hepatitis A, Hepatitis B, Hepatitis C Endocrine Denies history of Type I Diabetes, Type II Diabetes Genitourinary Denies history of End Stage Renal Disease Immunological Denies history of Lupus Erythematosus, Raynaudoos, Scleroderma Integumentary (Skin) Denies history of History of Burn Musculoskeletal Denies history of Gout, Rheumatoid Arthritis, Osteoarthritis, Osteomyelitis Neurologic Denies history of Dementia, Neuropathy, Quadriplegia, Paraplegia, Seizure Disorder Oncologic Denies history of Received Chemotherapy, Received Radiation Psychiatric Denies history of Anorexia/bulimia, Confinement Anxiety Review of Systems (ROS) Constitutional Symptoms (General Health) Denies complaints or symptoms of Fatigue, Fever, Chills, Marked Weight Change. Eyes Complains or has symptoms of Glasses / Contacts. Denies complaints or symptoms of Dry Eyes, Vision Changes. Ear/Nose/Mouth/Throat Denies complaints or symptoms of Chronic sinus problems or rhinitis. Respiratory Denies complaints or symptoms of Chronic or frequent coughs, Shortness of Breath. Cardiovascular Denies complaints or symptoms of Chest pain. Gastrointestinal Denies complaints or symptoms of Frequent diarrhea, Nausea, Vomiting. Endocrine Denies complaints or symptoms of Heat/cold intolerance. Genitourinary Denies complaints or symptoms of Frequent urination. Integumentary (Skin) Denies complaints or symptoms of  Wounds. Musculoskeletal Denies complaints or symptoms of Muscle Pain, Muscle Weakness. Neurologic Denies complaints or symptoms of Numbness/parasthesias. Psychiatric Denies complaints or symptoms of Claustrophobia, Suicidal. Objective Constitutional patient is hypertensive.. pulse regular and within target range for patient.Marland Kitchen respirations regular, non-labored and within target range for patient.Marland Kitchen temperature within target range for patient.. Well-nourished and well-hydrated in no acute distress. Vitals Time Taken: 2:16 PM, Height: 69 in, Source: Stated, Weight: 228 lbs, Source: Stated, BMI: 33.7, Temperature: 98.5 F, Pulse: 73 bpm, Respiratory Rate: 20 breaths/min, Blood Pressure: 157/96 mmHg. Eyes conjunctiva clear no eyelid edema noted. pupils equal round and reactive to light and accommodation. Ears, Nose, Mouth, and Throat no gross abnormality of ear auricles or external auditory canals. normal hearing noted during conversation. mucus membranes moist. Respiratory normal breathing without difficulty. Cardiovascular 2+ dorsalis pedis/posterior tibialis pulses. 2+ pitting edema of the bilateral lower extremities. Musculoskeletal normal gait and posture. no significant deformity or arthritic changes, no loss or range of motion, no clubbing. Psychiatric this patient is able to make decisions and demonstrates good insight into disease process. Alert and Oriented x 3. pleasant and cooperative. General Notes: Upon inspection patient's wound bed  actually showed signs of good epithelization at this time. There does not appear to be any evidence of active infection he mainly has more drainage on the posterior aspect of the right leg left leg is very minimal at this point. There is no signs of active infection at this time which is great news. No fevers, chills, nausea, vomiting, or diarrhea. Integumentary (Hair, Skin) Wound #1 status is Open. Original cause of wound was Gradually Appeared.  The wound is located on the Left,Anterior Lower Leg. The wound measures 2.5cm length x 6.2cm width x 0.1cm depth; 12.174cm^2 area and 1.217cm^3 volume. There is Fat Layer (Subcutaneous Tissue) exposed. There is no tunneling or undermining noted. There is a medium amount of serous drainage noted. There is small (1-33%) pink granulation within the wound bed. There is a large (67- 100%) amount of necrotic tissue within the wound bed including Adherent Slough. Wound #2 status is Open. Original cause of wound was Gradually Appeared. The wound is located on the Right,Circumferential Lower Leg. The wound measures 9.9cm length x 36cm width x 0.1cm depth; 279.916cm^2 area and 27.992cm^3 volume. There is Fat Layer (Subcutaneous Tissue) exposed. There is no tunneling or undermining noted. There is a medium amount of serosanguineous drainage noted. There is small (1-33%) pink granulation within the wound bed. There is a large (67-100%) amount of necrotic tissue within the wound bed including Adherent Slough. Assessment Active Problems ICD-10 Lymphedema, not elsewhere classified Venous insufficiency (chronic) (peripheral) Non-pressure chronic ulcer of other part of left lower leg with fat layer exposed Non-pressure chronic ulcer of other part of right lower leg with fat layer exposed Chronic kidney disease, stage 3 unspecified Chronic combined systolic (congestive) and diastolic (congestive) heart failure Essential (primary) hypertension Procedures Wound #1 Pre-procedure diagnosis of Wound #1 is a Lymphedema located on the Left,Anterior Lower Leg . There was a Three Layer Compression Therapy Procedure by Zandra Abts, RN. Post procedure Diagnosis Wound #1: Same as Pre-Procedure Wound #2 Pre-procedure diagnosis of Wound #2 is a Lymphedema located on the Right,Circumferential Lower Leg . There was a Three Layer Compression Therapy Procedure by Zandra Abts, RN. Post procedure Diagnosis Wound #2: Same  as Pre-Procedure Plan Follow-up Appointments: Return Appointment in 1 week. - Tuesday Dressing Change Frequency: Do not change entire dressing for one week. - both legs Skin Barriers/Peri-Wound Care: Moisturizing lotion TCA Cream or Ointment - liberally, mixed with lotion Wound Cleansing: May shower with protection. - use cast protector Primary Wound Dressing: Wound #1 Left,Anterior Lower Leg: Other: - Triamcinolone Wound #2 Right,Circumferential Lower Leg: Calcium Alginate with Silver Secondary Dressing: Wound #1 Left,Anterior Lower Leg: ABD pad Wound #2 Right,Circumferential Lower Leg: ABD pad Edema Control: 3 Layer Compression System - Bilateral Avoid standing for long periods of time Elevate legs to the level of the heart or above for 30 minutes daily and/or when sitting, a frequency of: - throughout the day Exercise regularly 1. I would recommend at this time that we have the patient continue to try to elevate his legs much as possible I think this is important. 2. I am also can recommend initiation of a three layer compression wrap which I think would be a good idea for him at this point try to get some of the edema under better control. 3. I am also going to suggest that we use a silver alginate dressing on the posterior aspect of right leg otherwise wounds ABD pad on the other areas that may be slightly weeping that is minimal at this  point. We will see patient back for reevaluation in 1 week here in the clinic. If anything worsens or changes patient will contact our office for additional recommendations. Electronic Signature(s) Signed: 02/24/2020 3:39:47 PM By: Lenda Kelp PA-C Entered By: Lenda Kelp on 02/24/2020 15:39:46 -------------------------------------------------------------------------------- HxROS Details Patient Name: Date of Service: ROLLA, KEDZIERSKI 02/24/2020 1:15 PM Medical Record Number: [id number] Patient Account Number: [id number] Date of  Birth/Sex: Treating RN: 1960/12/16 (59 y.o. Judie Petit) Yevonne Pax Primary Care Provider: PA Zenovia Jordan, NO Other Clinician: Referring Provider: Treating Provider/Extender: Meribeth Mattes, PA TRICK Weeks in Treatment: 0 Information Obtained From Patient Constitutional Symptoms (General Health) Complaints and Symptoms: Negative for: Fatigue; Fever; Chills; Marked Weight Change Eyes Complaints and Symptoms: Positive for: Glasses / Contacts Negative for: Dry Eyes; Vision Changes Medical History: Negative for: Cataracts; Glaucoma; Optic Neuritis Ear/Nose/Mouth/Throat Complaints and Symptoms: Negative for: Chronic sinus problems or rhinitis Medical History: Negative for: Chronic sinus problems/congestion; Middle ear problems Respiratory Complaints and Symptoms: Negative for: Chronic or frequent coughs; Shortness of Breath Medical History: Negative for: Aspiration; Asthma; Chronic Obstructive Pulmonary Disease (COPD); Pneumothorax; Sleep Apnea; Tuberculosis Cardiovascular Complaints and Symptoms: Negative for: Chest pain Medical History: Positive for: Congestive Heart Failure; Hypertension Negative for: Angina; Arrhythmia; Coronary Artery Disease; Deep Vein Thrombosis; Hypotension; Myocardial Infarction; Peripheral Arterial Disease; Peripheral Venous Disease; Phlebitis; Vasculitis Gastrointestinal Complaints and Symptoms: Negative for: Frequent diarrhea; Nausea; Vomiting Medical History: Negative for: Cirrhosis ; Colitis; Crohns; Hepatitis A; Hepatitis B; Hepatitis C Endocrine Complaints and Symptoms: Negative for: Heat/cold intolerance Medical History: Negative for: Type I Diabetes; Type II Diabetes Genitourinary Complaints and Symptoms: Negative for: Frequent urination Medical History: Negative for: End Stage Renal Disease Integumentary (Skin) Complaints and Symptoms: Negative for: Wounds Medical History: Negative for: History of Burn Musculoskeletal Complaints and  Symptoms: Negative for: Muscle Pain; Muscle Weakness Medical History: Negative for: Gout; Rheumatoid Arthritis; Osteoarthritis; Osteomyelitis Neurologic Complaints and Symptoms: Negative for: Numbness/parasthesias Medical History: Negative for: Dementia; Neuropathy; Quadriplegia; Paraplegia; Seizure Disorder Psychiatric Complaints and Symptoms: Negative for: Claustrophobia; Suicidal Medical History: Negative for: Anorexia/bulimia; Confinement Anxiety Hematologic/Lymphatic Medical History: Negative for: Anemia; Hemophilia; Human Immunodeficiency Virus; Lymphedema; Sickle Cell Disease Immunological Medical History: Negative for: Lupus Erythematosus; Raynauds; Scleroderma Oncologic Medical History: Negative for: Received Chemotherapy; Received Radiation Immunizations Pneumococcal Vaccine: Received Pneumococcal Vaccination: No Implantable Devices None Family and Social History Cancer: No; Diabetes: No; Heart Disease: Yes - Mother; Hereditary Spherocytosis: No; Hypertension: Yes - Mother; Kidney Disease: Yes - Mother; Lung Disease: No; Seizures: No; Stroke: No; Thyroid Problems: No; Tuberculosis: No; Never smoker; Marital Status - Married; Alcohol Use: Never; Drug Use: No History; Caffeine Use: Never; Financial Concerns: No; Food, Clothing or Shelter Needs: No; Support System Lacking: No; Transportation Concerns: No Electronic Signature(s) Signed: 02/25/2020 5:18:12 PM By: Yevonne Pax RN Signed: 02/26/2020 5:04:05 PM By: Lenda Kelp PA-C Entered By: Yevonne Pax on 02/24/2020 14:41:12 -------------------------------------------------------------------------------- SuperBill Details Patient Name: Date of Service: ELMO, RIO 02/24/2020 Medical Record Number: [id number] Patient Account Number: [id number] Date of Birth/Sex: Treating RN: 1961/04/04 (59 y.o. [name] Primary Care Provider: PA TIENT, NO Other Clinician: Referring Provider: Treating Provider/Extender:  Meribeth Mattes, PA TRICK Weeks in Treatment: 0 Diagnosis Coding ICD-10 Codes Code Description I89.0 Lymphedema, not elsewhere classified I87.2 Venous insufficiency (chronic) (peripheral) L97.822 Non-pressure chronic ulcer of other part of left lower leg with fat layer exposed L97.812 Non-pressure chronic ulcer of other part of right lower leg with fat layer exposed N18.30 Chronic kidney disease, stage 3 unspecified  I50.42 Chronic combined systolic (congestive) and diastolic (congestive) heart failure I10 Essential (primary) hypertension Facility Procedures CPT4: Code [id number] Description: 13 - WOUND CARE VISIT-LEV 3 EST PT Modifier: 25 Quantity: 1 CPT4: [id number] Description: 81 BILATERAL: Application of multi-layer venous compression system; leg (below knee), including ankle and t. Modifier: Quantity: 1 Physician Procedures : CPT4 Code Description Modifier [id number] WC PHYS LEVEL 3 NEW PT ICD-10 Diagnosis Description I89.0 Lymphedema, not elsewhere classified I87.2 Venous insufficiency (chronic) (peripheral) L97.822 Non-pressure chronic ulcer of other part of left lower leg  with fat layer exposed L97.812 Non-pressure chronic ulcer of other part of right lower leg with fat layer exposed Quantity: 1 Electronic Signature(s) Signed: 02/24/2020 5:46:54 PM By: Zandra AbtsLynch, Shatara RN, BSN Signed: 02/26/2020 5:04:05 PM By: Lenda KelpStone III, Jakeim Sedore PA-C Previous Signature: 02/24/2020 3:40:01 PM Version By: Lenda KelpStone III, Tekla Malachowski PA-C Entered By: Zandra AbtsLynch, Shatara on 02/24/2020 17:41:08

## 2020-03-02 ENCOUNTER — Telehealth: Payer: Self-pay

## 2020-03-02 NOTE — Telephone Encounter (Signed)
Copied from CRM [id number]. Topic: Appointment Scheduling - Scheduling Inquiry for Clinic >> Feb 26, 2020  1:59 PM Randol Kern wrote: [phone] Patient needs to reschedule, preferably 03/03/2020 the same time as his current appt, please advise. He is in pain and actually needs to reschedule, he has the paperwork he just cannot come in today.

## 2020-03-03 ENCOUNTER — Ambulatory Visit: Payer: Medicaid Other | Attending: Critical Care Medicine | Admitting: Critical Care Medicine

## 2020-03-03 ENCOUNTER — Other Ambulatory Visit: Payer: Self-pay

## 2020-03-03 ENCOUNTER — Encounter: Payer: Self-pay | Admitting: Critical Care Medicine

## 2020-03-03 VITALS — BP 165/97 | HR 80 | Temp 98.3°F | Wt 208.0 lb

## 2020-03-03 DIAGNOSIS — I5032 Chronic diastolic (congestive) heart failure: Secondary | ICD-10-CM

## 2020-03-03 DIAGNOSIS — E7841 Elevated Lipoprotein(a): Secondary | ICD-10-CM

## 2020-03-03 DIAGNOSIS — Z1211 Encounter for screening for malignant neoplasm of colon: Secondary | ICD-10-CM | POA: Diagnosis not present

## 2020-03-03 DIAGNOSIS — R600 Localized edema: Secondary | ICD-10-CM | POA: Diagnosis not present

## 2020-03-03 DIAGNOSIS — N1832 Chronic kidney disease, stage 3b: Secondary | ICD-10-CM

## 2020-03-03 DIAGNOSIS — I10 Essential (primary) hypertension: Secondary | ICD-10-CM

## 2020-03-03 NOTE — Patient Instructions (Signed)
No change in medications  When you see the kidney doctor inquire about any additional changes in blood pressure medicines they may recommend  Pickup a stool kit to screen you for colon cancer  Return Dr. Joya Gaskins 1 month  Keep your wound care clinic appointments

## 2020-03-03 NOTE — Assessment & Plan Note (Signed)
Patient recommended to be more compliant with blood pressure medicines and will discuss further management of blood pressure when he sees nephrology in a few weeks

## 2020-03-03 NOTE — Progress Notes (Signed)
Pt feeling good about wound care  Right arm edema last week went down 2 days ago
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
## 2020-03-03 NOTE — Assessment & Plan Note (Signed)
Follow-up per nephrology

## 2020-03-03 NOTE — Assessment & Plan Note (Signed)
Bilateral lower extremity edema with open wounds improved with dressing applied by wound care center  [facility] current course of antibiotics and follow-up with wound care center weekly

## 2020-03-03 NOTE — Assessment & Plan Note (Signed)
Diastolic heart failure stable at this time

## 2020-03-04 ENCOUNTER — Encounter (HOSPITAL_BASED_OUTPATIENT_CLINIC_OR_DEPARTMENT_OTHER): Payer: Medicaid Other | Attending: Physician Assistant | Admitting: Physician Assistant

## 2020-03-04 DIAGNOSIS — I5042 Chronic combined systolic (congestive) and diastolic (congestive) heart failure: Secondary | ICD-10-CM | POA: Insufficient documentation

## 2020-03-04 DIAGNOSIS — I872 Venous insufficiency (chronic) (peripheral): Secondary | ICD-10-CM | POA: Insufficient documentation

## 2020-03-04 DIAGNOSIS — I130 Hypertensive heart and chronic kidney disease with heart failure and stage 1 through stage 4 chronic kidney disease, or unspecified chronic kidney disease: Secondary | ICD-10-CM | POA: Insufficient documentation

## 2020-03-04 DIAGNOSIS — I890 Lymphedema, not elsewhere classified: Secondary | ICD-10-CM | POA: Diagnosis present

## 2020-03-04 DIAGNOSIS — L97812 Non-pressure chronic ulcer of other part of right lower leg with fat layer exposed: Secondary | ICD-10-CM | POA: Diagnosis not present

## 2020-03-04 DIAGNOSIS — L97822 Non-pressure chronic ulcer of other part of left lower leg with fat layer exposed: Secondary | ICD-10-CM | POA: Diagnosis not present

## 2020-03-04 DIAGNOSIS — N183 Chronic kidney disease, stage 3 unspecified: Secondary | ICD-10-CM | POA: Insufficient documentation

## 2020-03-04 NOTE — Progress Notes (Addendum)
HOLMAN, BONSIGNORE ([id number]) Visit Report for 03/04/2020 Chief Complaint Document Details Patient Name: Date of Service: [name], [name] 03/04/2020 1:00 PM Medical Record Number: [id number] Patient Account Number: [id number] Date of Birth/Sex: Treating RN: March 02, 1961 (59 Small.o. [name] Primary Care Provider: Delford Field, [place] TRICK Other Clinician: Referring Provider: Treating Provider/Extender: Meribeth Mattes, PA TRICK Weeks in Treatment: 1 Information Obtained from: Patient Chief Complaint Bilateral Leg Ulcers Electronic Signature(s) Signed: 03/04/2020 1:25:46 PM By: Lenda Kelp PA-C Entered By: Lenda Kelp on 03/04/2020 13:25:45 -------------------------------------------------------------------------------- HPI Details Patient Name: Date of Service: [name], [name] 03/04/2020 1:00 PM Medical Record Number: [id number] Patient Account Number: [id number] Date of Birth/Sex: Treating RN: 10-29-1960 (35 Small.o. [name] Primary Care Provider: Delford Field, [place] TRICK Other Clinician: Referring Provider: Treating Provider/Extender: Meribeth Mattes, PA TRICK Weeks in Treatment: 1 History of Present Illness HPI Description: 02/24/2020 on evaluation today patient presents with obvious chronic lymphedema of the bilateral lower extremities. Fortunately there is no signs of active infection at this time which is great news. With that being said the patient does have a recent treatment with doxycycline 100 mg for 10 days which he has completed. He does have a history of chronic kidney disease stage III as documented in his chart. He also has chronic venous insufficiency with lower extremity lymphedema noted. He has congestive heart failure and hypertension. He tells me currently that he was unaware of the kidney disease before he mentioned this today. He also tells me that his primary care provider had told him to quit taking showers based on what was going on with his legs at this  time. Fortunately there is no signs of active infection at this time which is great news. No fevers, chills, nausea, vomiting, or diarrhea. 03/04/2020 on evaluation today patient actually appears to be doing much better in regard to his legs. There is really not much draining a lot of the dry skin is starting to loosen up amount of time to get as much of this off today as possible Electronic Signature(s) Signed: 03/04/2020 1:57:26 PM By: Lenda Kelp PA-C Entered By: Lenda Kelp on 03/04/2020 13:57:26 -------------------------------------------------------------------------------- Physical Exam Details Patient Name: Date of Service: [name], [name] 03/04/2020 1:00 PM Medical Record Number: [id number] Patient Account Number: [id number] Date of Birth/Sex: Treating RN: July 09, 1960 (70 Small.o. [name] Primary Care Provider: Delford Field, [place] TRICK Other Clinician: Referring Provider: Treating Provider/Extender: Meribeth Mattes, PA TRICK Weeks in Treatment: 1 Constitutional Well-nourished and well-hydrated in no acute distress. Respiratory normal breathing without difficulty. Psychiatric this patient is able to make decisions and demonstrates good insight into disease process. Alert and Oriented x 3. pleasant and cooperative. Notes Using one of the cardboard measuring rollers I did actually work on the patient's legs to remove as much as possible of the dry scaly skin we were actually able to remove I would say at least 2/3-3/4 of the amount of dry skin covering him and underneath there appear to be good skin really no open wounds. Overall I feel like he is doing much better I do believe the triamcinolone and lotion is loosen things up here. Hopefully will be able to get the rest off next week. Electronic Signature(s) Signed: 03/04/2020 1:57:59 PM By: Lenda Kelp PA-C Entered By: Lenda Kelp on 03/04/2020  13:57:58 -------------------------------------------------------------------------------- Physician Orders Details Patient Name: Date of Service: [name], [name] 03/04/2020 1:00 PM Medical Record Number: [id number] Patient Account Number: [id number] Date of  Birth/Sex: Treating RN: November 30, 1960 (59 Small.o. [name] Primary Care Provider: Delford Field, [place] TRICK Other Clinician: Referring Provider: Treating Provider/Extender: Meribeth Mattes, PA TRICK Weeks in Treatment: 1 Verbal / Phone Orders: No Diagnosis Coding ICD-10 Coding Code Description I89.0 Lymphedema, not elsewhere classified I87.2 Venous insufficiency (chronic) (peripheral) L97.822 Non-pressure chronic ulcer of other part of left lower leg with fat layer exposed L97.812 Non-pressure chronic ulcer of other part of right lower leg with fat layer exposed N18.30 Chronic kidney disease, stage 3 unspecified I50.42 Chronic combined systolic (congestive) and diastolic (congestive) heart failure I10 Essential (primary) hypertension Follow-up Appointments Return Appointment in 1 week. Dressing Change Frequency Do not change entire dressing for one week. - both legs Skin Barriers/Peri-Wound Care Moisturizing lotion TCA Cream or Ointment - liberally, mixed with lotion Wound Cleansing May shower with protection. - use cast protector Primary Wound Dressing Wound #1 Left,Anterior Lower Leg Other: - Triamcinolone Wound #2 Right,Circumferential Lower Leg Calcium Alginate with Silver Secondary Dressing Wound #1 Left,Anterior Lower Leg ABD pad Wound #2 Right,Circumferential Lower Leg ABD pad Edema Control 3 Layer Compression System - Bilateral Avoid standing for long periods of time Elevate legs to the level of the heart or above for 30 minutes daily and/or when sitting, a frequency of: - throughout the day Exercise regularly Electronic Signature(s) Signed: 03/04/2020 4:34:22 PM By: Lenda Kelp PA-C Signed: 03/04/2020  5:21:24 PM By: [name] RN, BSN Entered By: [name] on 03/04/2020 13:48:46 -------------------------------------------------------------------------------- Problem List Details Patient Name: Date of Service: [name], [name] 03/04/2020 1:00 PM Medical Record Number: [id number] Patient Account Number: [id number] Date of Birth/Sex: Treating RN: Feb 10, 1961 (59 Small.o. [name] Primary Care Provider: Delford Field, [place] TRICK Other Clinician: Referring Provider: Treating Provider/Extender: Meribeth Mattes, PA TRICK Weeks in Treatment: 1 Active Problems ICD-10 Encounter Code Description Active Date MDM Diagnosis I89.0 Lymphedema, not elsewhere classified 02/24/2020 No Yes I87.2 Venous insufficiency (chronic) (peripheral) 02/24/2020 No Yes L97.822 Non-pressure chronic ulcer of other part of left lower leg with fat layer exposed11/22/2021 No Yes L97.812 Non-pressure chronic ulcer of other part of right lower leg with fat layer 02/24/2020 No Yes exposed N18.30 Chronic kidney disease, stage 3 unspecified 02/24/2020 No Yes I50.42 Chronic combined systolic (congestive) and diastolic (congestive) heart failure 02/24/2020 No Yes I10 Essential (primary) hypertension 02/24/2020 No Yes Inactive Problems Resolved Problems Electronic Signature(s) Signed: 03/04/2020 1:25:41 PM By: Lenda Kelp PA-C Entered By: Lenda Kelp on 03/04/2020 13:25:40 -------------------------------------------------------------------------------- Progress Note Details Patient Name: Date of Service: [name], [name] 03/04/2020 1:00 PM Medical Record Number: [id number] Patient Account Number: [id number] Date of Birth/Sex: Treating RN: [birth year] (65 Small.o. [name] Primary Care Provider: Delford Field, [place] TRICK Other Clinician: Referring Provider: Treating Provider/Extender: Meribeth Mattes, PA TRICK Weeks in Treatment: 1 Subjective Chief Complaint Information obtained from  Patient Bilateral Leg Ulcers History of Present Illness (HPI) 02/24/2020 on evaluation today patient presents with obvious chronic lymphedema of the bilateral lower extremities. Fortunately there is no signs of active infection at this time which is great news. With that being said the patient does have a recent treatment with doxycycline 100 mg for 10 days which he has completed. He does have a history of chronic kidney disease stage III as documented in his chart. He also has chronic venous insufficiency with lower extremity lymphedema noted. He has congestive heart failure and hypertension. He tells me currently that he was unaware of the kidney disease before he mentioned this today. He also  tells me that his primary care provider had told him to quit taking showers based on what was going on with his legs at this time. Fortunately there is no signs of active infection at this time which is great news. No fevers, chills, nausea, vomiting, or diarrhea. 03/04/2020 on evaluation today patient actually appears to be doing much better in regard to his legs. There is really not much draining a lot of the dry skin is starting to loosen up amount of time to get as much of this off today as possible Objective Constitutional Well-nourished and well-hydrated in no acute distress. Vitals Time Taken: 1:21 PM, Height: 69 in, Weight: 228 lbs, BMI: 33.7, Temperature: 98.3 F, Pulse: 69 bpm, Respiratory Rate: 16 breaths/min, Blood Pressure: 129/79 mmHg. Respiratory normal breathing without difficulty. Psychiatric this patient is able to make decisions and demonstrates good insight into disease process. Alert and Oriented x 3. pleasant and cooperative. General Notes: Using one of the cardboard measuring rollers I did actually work on the patient's legs to remove as much as possible of the dry scaly skin we were actually able to remove I would say at least 2/3-3/4 of the amount of dry skin covering him and  underneath there appear to be good skin really no open wounds. Overall I feel like he is doing much better I do believe the triamcinolone and lotion is loosen things up here. Hopefully will be able to get the rest off next week. Integumentary (Hair, Skin) Wound #1 status is Open. Original cause of wound was Gradually Appeared. The wound is located on the Left,Anterior Lower Leg. The wound measures 1.5cm length x 2cm width x 0.1cm depth; 2.356cm^2 area and 0.236cm^3 volume. There is Fat Layer (Subcutaneous Tissue) exposed. There is no tunneling or undermining noted. There is a medium amount of serous drainage noted. The wound margin is distinct with the outline attached to the wound base. There is large (67-100%) pink granulation within the wound bed. There is no necrotic tissue within the wound bed. Wound #2 status is Open. Original cause of wound was Gradually Appeared. The wound is located on the Right,Circumferential Lower Leg. The wound measures 11cm length x 10cm width x 0.1cm depth; 86.394cm^2 area and 8.639cm^3 volume. There is Fat Layer (Subcutaneous Tissue) exposed. There is no tunneling or undermining noted. There is a medium amount of serosanguineous drainage noted. The wound margin is distinct with the outline attached to the wound base. There is large (67-100%) pink granulation within the wound bed. There is no necrotic tissue within the wound bed. Assessment Active Problems ICD-10 Lymphedema, not elsewhere classified Venous insufficiency (chronic) (peripheral) Non-pressure chronic ulcer of other part of left lower leg with fat layer exposed Non-pressure chronic ulcer of other part of right lower leg with fat layer exposed Chronic kidney disease, stage 3 unspecified Chronic combined systolic (congestive) and diastolic (congestive) heart failure Essential (primary) hypertension Procedures Wound #1 Pre-procedure diagnosis of Wound #1 is a Lymphedema located on the Left,Anterior  Lower Leg . There was a Compression Therapy Procedure by Yevonne Pax, RN. Post procedure Diagnosis Wound #1: Same as Pre-Procedure Wound #2 Pre-procedure diagnosis of Wound #2 is a Lymphedema located on the Right,Circumferential Lower Leg . There was a Compression Therapy Procedure by Yevonne Pax, RN. Post procedure Diagnosis Wound #2: Same as Pre-Procedure Plan Follow-up Appointments: Return Appointment in 1 week. Dressing Change Frequency: Do not change entire dressing for one week. - both legs Skin Barriers/Peri-Wound Care: Moisturizing lotion TCA Cream or Ointment -  liberally, mixed with lotion Wound Cleansing: May shower with protection. - use cast protector Primary Wound Dressing: Wound #1 Left,Anterior Lower Leg: Other: - Triamcinolone Wound #2 Right,Circumferential Lower Leg: Calcium Alginate with Silver Secondary Dressing: Wound #1 Left,Anterior Lower Leg: ABD pad Wound #2 Right,Circumferential Lower Leg: ABD pad Edema Control: 3 Layer Compression System - Bilateral Avoid standing for long periods of time Elevate legs to the level of the heart or above for 30 minutes daily and/or when sitting, a frequency of: - throughout the day Exercise regularly 1. I would recommend currently that we will continue with the wound care measures with the wraps as well as the lotion and triamcinolone I think this will be good job. 2. I recommend he continue to elevate his legs much as possible try to keep edema under good control. We will see patient back for reevaluation in 1 week here in the clinic. If anything worsens or changes patient will contact our office for additional recommendations. Electronic Signature(s) Signed: 03/04/2020 1:58:15 PM By: Lenda KelpStone III, Carleena Mires PA-C Entered By: Lenda KelpStone III, Madelina Sanda on 03/04/2020 13:58:15 -------------------------------------------------------------------------------- SuperBill Details Patient Name: Date of Service: [name], [name] 03/04/2020 Medical  Record Number: [id number] Patient Account Number: [id number] Date of Birth/Sex: Treating RN: [birth year] (59 Small.o. Shawn SchoonerM) Boehlein, Linda Primary Care Provider: Delford FieldWRIGHT, [place] TRICK Other Clinician: Referring Provider: Treating Provider/Extender: Meribeth MattesStone III, Batoul Limes WRIGHT, PA TRICK Weeks in Treatment: 1 Diagnosis Coding ICD-10 Codes Code Description I89.0 Lymphedema, not elsewhere classified I87.2 Venous insufficiency (chronic) (peripheral) L97.822 Non-pressure chronic ulcer of other part of left lower leg with fat layer exposed L97.812 Non-pressure chronic ulcer of other part of right lower leg with fat layer exposed N18.30 Chronic kidney disease, stage 3 unspecified I50.42 Chronic combined systolic (congestive) and diastolic (congestive) heart failure I10 Essential (primary) hypertension Facility Procedures CPT4: Code [id number] Description: 81 BILATERAL: Application of multi-layer venous compression system; leg (below knee), including ankle and t. Modifier: Quantity: 1 Physician Procedures : CPT4 Code Description Modifier [id number] - WC PHYS LEVEL 3 - EST PT ICD-10 Diagnosis Description I89.0 Lymphedema, not elsewhere classified I87.2 Venous insufficiency (chronic) (peripheral) L97.822 Non-pressure chronic ulcer of other part of left  lower leg with fat layer exposed L97.812 Non-pressure chronic ulcer of other part of right lower leg with fat layer exposed Quantity: 1 Electronic Signature(s) Signed: 03/04/2020 1:58:32 PM By: Lenda KelpStone III, Curtistine Pettitt PA-C Entered By: Lenda KelpStone III, Tsion Inghram on 03/04/2020 13:58:31

## 2020-03-04 NOTE — Progress Notes (Signed)
Shawn, Small ([id number]) Visit Report for 03/04/2020 Arrival Information Details Patient Name: Date of Service: [name], [name] 03/04/2020 1:00 PM Medical Record Number: [id number] Patient Account Number: [id number] Date of Birth/Sex: Treating RN: Jul 29, 1960 (59 y.o. [name] Primary Care Khylon Davies: Delford Field, [place] TRICK Other Clinician: Referring Kearsten Ginther: Treating Arvin Abello/Extender: Meribeth Mattes, PA TRICK Weeks in Treatment: 1 Visit Information History Since Last Visit Added or deleted any medications: No Patient Arrived: Ambulatory Any new allergies or adverse reactions: No Arrival Time: 13:20 Had a fall or experienced change in No Accompanied By: self activities of daily living that may affect Transfer Assistance: None risk of falls: Patient Identification Verified: Yes Signs or symptoms of abuse/neglect since last visito No Secondary Verification Process Completed: Yes Hospitalized since last visit: No Patient Requires Transmission-Based No Implantable device outside of the clinic excluding No Precautions: cellular tissue based products placed in the center Patient Has Alerts: Yes since last visit: Patient Alerts: ABI non compressible bil. Has Dressing in Place as Prescribed: Yes Has Compression in Place as Prescribed: Yes Pain Present Now: No Electronic Signature(s) Signed: 03/04/2020 5:34:45 PM By: Shawn Stall Entered By: Shawn Stall on 03/04/2020 13:33:53 -------------------------------------------------------------------------------- Compression Therapy Details Patient Name: Date of Service: Shawn, Small 03/04/2020 1:00 PM Medical Record Number: [id number] Patient Account Number: [id number] Date of Birth/Sex: Treating RN: December 31, 1960 (59 y.o. [name] Primary Care Orah Sonnen: Delford Field, [place] TRICK Other Clinician: Referring Ayianna Darnold: Treating Brittinee Risk/Extender: Meribeth Mattes, PA TRICK Weeks in Treatment: 1 Compression Therapy Performed for  Wound Assessment: Wound #1 Left,Anterior Lower Leg Performed By: Clinician Yevonne Pax, RN Post Procedure Diagnosis Same as Pre-procedure Electronic Signature(s) Signed: 03/04/2020 5:21:24 PM By: [name] RN, BSN Entered By: [name] on 03/04/2020 13:45:36 -------------------------------------------------------------------------------- Compression Therapy Details Patient Name: Date of Service: Small, Shawn 03/04/2020 1:00 PM Medical Record Number: [id number] Patient Account Number: [id number] Date of Birth/Sex: Treating RN: 04-23-1960 (59 y.o. [name] Primary Care Shirley Decamp: Delford Field, [place] TRICK Other Clinician: Referring Gerron Guidotti: Treating Shaleta Ruacho/Extender: Meribeth Mattes, PA TRICK Weeks in Treatment: 1 Compression Therapy Performed for Wound Assessment: Wound #2 Right,Circumferential Lower Leg Performed By: Clinician Yevonne Pax, RN Post Procedure Diagnosis Same as Pre-procedure Electronic Signature(s) Signed: 03/04/2020 5:21:24 PM By: [name] RN, BSN Entered By: [name] on 03/04/2020 13:45:36 -------------------------------------------------------------------------------- Encounter Discharge Information Details Patient Name: Date of Service: Small, Shawn 03/04/2020 1:00 PM Medical Record Number: [id number] Patient Account Number: [id number] Date of Birth/Sex: Treating RN: 30-Apr-1960 (59 y.o. Melonie [place] Primary Care Dealie Koelzer: Delford Field, [place] TRICK Other Clinician: Referring Seif Teichert: Treating Hitoshi Werts/Extender: Meribeth Mattes, PA TRICK Weeks in Treatment: 1 Encounter Discharge Information Items Discharge Condition: Stable Ambulatory Status: Ambulatory Discharge Destination: Home Transportation: Private Auto Accompanied By: self Schedule Follow-up Appointment: Yes Clinical Summary of Care: Patient Declined Electronic Signature(s) Signed: 03/04/2020 4:46:53 PM By: Yevonne Pax RN Entered By: Yevonne Pax on 03/04/2020  14:12:03 -------------------------------------------------------------------------------- Lower Extremity Assessment Details Patient Name: Date of Service: Small, Shawn 03/04/2020 1:00 PM Medical Record Number: [id number] Patient Account Number: [id number] Date of Birth/Sex: Treating RN: 03-29-1961 (59 y.o. [name] Primary Care Deejay Koppelman: Delford Field, [place] TRICK Other Clinician: Referring Baltazar Pekala: Treating Sarina Robleto/Extender: Meribeth Mattes, PA TRICK Weeks in Treatment: 1 Edema Assessment Assessed: [Left: Yes] [Right: Yes] Edema: [Left: Yes] [Right: Yes] Calf Left: Right: Point of Measurement: 44 cm From Medial Instep 35.5 cm 35 cm Ankle Left: Right: Point of Measurement: 12 cm From Medial Instep 24.5 cm 25 cm  Vascular Assessment Pulses: Dorsalis Pedis Palpable: [Left:Yes] [Right:Yes] Electronic Signature(s) Signed: 03/04/2020 5:34:45 PM By: Shawn Stall Entered By: Shawn Stall on 03/04/2020 13:35:00 -------------------------------------------------------------------------------- Multi-Disciplinary Care Plan Details Patient Name: Date of Service: Small, Shawn 03/04/2020 1:00 PM Medical Record Number: [id number] Patient Account Number: [id number] Date of Birth/Sex: Treating RN: [birth year] (59 y.o. [name] Primary Care Bryceson Grape: Delford Field, [place] TRICK Other Clinician: Referring Wyvonne Carda: Treating Brick Ketcher/Extender: Meribeth Mattes, PA TRICK Weeks in Treatment: 1 Active Inactive Venous Leg Ulcer Nursing Diagnoses: Actual venous Insuffiency (use after diagnosis is confirmed) Knowledge deficit related to disease process and management Goals: Patient will maintain optimal edema control Date Initiated: 02/24/2020 Target Resolution Date: 03/27/2020 Goal Status: Active Patient/caregiver will verbalize understanding of disease process and disease management Date Initiated: 02/24/2020 Target Resolution Date: 03/27/2020 Goal Status:  Active Interventions: Assess peripheral edema status every visit. Compression as ordered Provide education on venous insufficiency Notes: Wound/Skin Impairment Nursing Diagnoses: Impaired tissue integrity Knowledge deficit related to ulceration/compromised skin integrity Goals: Patient/caregiver will verbalize understanding of skin care regimen Date Initiated: 02/24/2020 Target Resolution Date: 03/27/2020 Goal Status: Active Interventions: Assess patient/caregiver ability to obtain necessary supplies Assess patient/caregiver ability to perform ulcer/skin care regimen upon admission and as needed Assess ulceration(s) every visit Provide education on ulcer and skin care Notes: Electronic Signature(s) Signed: 03/04/2020 5:21:24 PM By: [name] RN, BSN Entered By: [name] on 03/04/2020 13:44:41 -------------------------------------------------------------------------------- Pain Assessment Details Patient Name: Date of Service: PAXON, PROPES 03/04/2020 1:00 PM Medical Record Number: [id number] Patient Account Number: [id number] Date of Birth/Sex: Treating RN: [birth year] (59 y.o. [name] Primary Care Sigmond Patalano: Delford Field, [place] TRICK Other Clinician: Referring Dorothea Yow: Treating Anaija Wissink/Extender: Meribeth Mattes, PA TRICK Weeks in Treatment: 1 Active Problems Location of Pain Severity and Description of Pain Patient Has Paino No Site Locations Rate the pain. Current Pain Level: 0 Pain Management and Medication Current Pain Management: Medication: No Cold Application: No Rest: No Massage: No Activity: No T.E.N.S.: No Heat Application: No Leg drop or elevation: No Is the Current Pain Management Adequate: Adequate How does your wound impact your activities of daily livingo Sleep: No Bathing: No Appetite: No Relationship With Others: No Bladder Continence: No Emotions: No Bowel Continence: No Work: No Toileting: No Drive: No Dressing:  No Hobbies: No Electronic Signature(s) Signed: 03/04/2020 5:34:45 PM By: Shawn Stall Entered By: Shawn Stall on 03/04/2020 13:34:24 -------------------------------------------------------------------------------- Patient/Caregiver Education Details Patient Name: Date of Service: Bobbe Medico 12/1/2021andnbsp1:00 PM Medical Record Number: [id number] Patient Account Number: [id number] Date of Birth/Gender: Treating RN: 1960/06/13 (59 y.o. [name] Primary Care Physician: Delford Field, [place] TRICK Other Clinician: Referring Physician: Treating Physician/Extender: Meribeth Mattes, PA TRICK Weeks in Treatment: 1 Education Assessment Education Provided To: Patient Education Topics Provided Venous: Methods: Explain/Verbal Responses: Reinforcements needed, State content correctly Wound/Skin Impairment: Methods: Explain/Verbal Responses: Reinforcements needed, State content correctly Electronic Signature(s) Signed: 03/04/2020 5:21:24 PM By: [name] RN, BSN Entered By: [name] on 03/04/2020 13:45:05 -------------------------------------------------------------------------------- Wound Assessment Details Patient Name: Date of Service: [facility], [facility] 03/04/2020 1:00 PM Medical Record Number: [id number] Patient Account Number: [id number] Date of Birth/Sex: Treating RN: 1960-06-25 (59 y.o. [name] Primary Care Treyshaun Keatts: Delford Field, [place] TRICK Other Clinician: Referring Demarquis Osley: Treating Yvonne Stopher/Extender: Meribeth Mattes, PA TRICK Weeks in Treatment: 1 Wound Status Wound Number: 1 Primary Etiology: Lymphedema Wound Location: Left, Anterior Lower Leg Wound Status: Open Wounding Event: Gradually Appeared Comorbid History: Congestive Heart Failure, Hypertension Date Acquired: 01/03/2020 Weeks Of  Treatment: 1 Clustered Wound: No Wound Measurements Length: (cm) 1.5 Width: (cm) 2 Depth: (cm) 0.1 Area: (cm) 2.356 Volume: (cm) 0.236 % Reduction  in Area: 80.6% % Reduction in Volume: 80.6% Epithelialization: Large (67-100%) Tunneling: No Undermining: No Wound Description Classification: Full Thickness Without Exposed Support Structures Wound Margin: Distinct, outline attached Exudate Amount: Medium Exudate Type: Serous Exudate Color: amber Wound Bed Granulation Amount: Large (67-100%) Granulation Quality: Pink Necrotic Amount: None Present (0%) Foul Odor After Cleansing: No Slough/Fibrino No Exposed Structure Fascia Exposed: No Fat Layer (Subcutaneous Tissue) Exposed: Yes Tendon Exposed: No Muscle Exposed: No Joint Exposed: No Bone Exposed: No Treatment Notes Wound #1 (Left, Anterior Lower Leg) 1. Cleanse With Wound Cleanser Soap and water 2. Periwound Care TCA Cream 3. Primary Dressing Applied Calcium Alginate Ag 4. Secondary Dressing Dry Gauze 6. Support Layer Applied 3 layer compression wrap Notes netting Electronic Signature(s) Signed: 03/04/2020 5:34:45 PM By: Shawn Stall Entered By: Shawn Stall on 03/04/2020 13:36:50 -------------------------------------------------------------------------------- Wound Assessment Details Patient Name: Date of Service: DORSIE, BURICH 03/04/2020 1:00 PM Medical Record Number: [id number] Patient Account Number: [id number] Date of Birth/Sex: Treating RN: 1961/03/28 (58 y.o. [name] Primary Care Neema Fluegge: Delford Field, [place] TRICK Other Clinician: Referring Nariya Neumeyer: Treating Letzy Gullickson/Extender: Meribeth Mattes, PA TRICK Weeks in Treatment: 1 Wound Status Wound Number: 2 Primary Etiology: Lymphedema Wound Location: Right, Circumferential Lower Leg Wound Status: Open Wounding Event: Gradually Appeared Comorbid History: Congestive Heart Failure, Hypertension Date Acquired: 01/03/2020 Weeks Of Treatment: 1 Clustered Wound: No Wound Measurements Length: (cm) 11 Width: (cm) 10 Depth: (cm) 0.1 Area: (cm) 86.394 Volume: (cm) 8.639 % Reduction in Area:  69.1% % Reduction in Volume: 69.1% Epithelialization: Medium (34-66%) Tunneling: No Undermining: No Wound Description Classification: Full Thickness Without Exposed Support Structures Wound Margin: Distinct, outline attached Exudate Amount: Medium Exudate Type: Serosanguineous Exudate Color: red, brown Foul Odor After Cleansing: No Slough/Fibrino No Wound Bed Granulation Amount: Large (67-100%) Exposed Structure Granulation Quality: Pink Fascia Exposed: No Necrotic Amount: None Present (0%) Fat Layer (Subcutaneous Tissue) Exposed: Yes Tendon Exposed: No Muscle Exposed: No Joint Exposed: No Bone Exposed: No Treatment Notes Wound #2 (Right, Circumferential Lower Leg) 1. Cleanse With Wound Cleanser Soap and water 2. Periwound Care TCA Cream 3. Primary Dressing Applied Calcium Alginate Ag 4. Secondary Dressing Dry Gauze 6. Support Layer Applied 3 layer compression wrap Notes netting Electronic Signature(s) Signed: 03/04/2020 5:34:45 PM By: Shawn Stall Entered By: Shawn Stall on 03/04/2020 13:37:08 -------------------------------------------------------------------------------- Vitals Details Patient Name: Date of Service: ROLLA, KEDZIERSKI 03/04/2020 1:00 PM Medical Record Number: [id number] Patient Account Number: [id number] Date of Birth/Sex: Treating RN: [birth year] (59 y.o. [name] Primary Care Olegario Emberson: Delford Field, [place] TRICK Other Clinician: Referring Cincere Deprey: Treating Ardene Remley/Extender: Meribeth Mattes, PA TRICK Weeks in Treatment: 1 Vital Signs Time Taken: 13:21 Temperature (F): 98.3 Height (in): 69 Pulse (bpm): 69 Weight (lbs): 228 Respiratory Rate (breaths/min): 16 Body Mass Index (BMI): 33.7 Blood Pressure (mmHg): 129/79 Reference Range: 80 - 120 mg / dl Electronic Signature(s) Signed: 03/04/2020 5:34:45 PM By: Shawn Stall Entered By: Shawn Stall on 03/04/2020 13:34:15

## 2020-03-05 LAB — FECAL OCCULT BLOOD, IMMUNOCHEMICAL: Fecal Occult Bld: NEGATIVE
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
## 2020-03-06 ENCOUNTER — Encounter: Payer: Self-pay | Admitting: Cardiology

## 2020-03-06 ENCOUNTER — Ambulatory Visit (INDEPENDENT_AMBULATORY_CARE_PROVIDER_SITE_OTHER): Payer: Medicaid Other | Admitting: Cardiology

## 2020-03-06 ENCOUNTER — Other Ambulatory Visit: Payer: Self-pay

## 2020-03-06 VITALS — BP 150/92 | HR 63 | Ht 68.0 in | Wt 201.0 lb

## 2020-03-06 DIAGNOSIS — E785 Hyperlipidemia, unspecified: Secondary | ICD-10-CM

## 2020-03-06 DIAGNOSIS — Z79899 Other long term (current) drug therapy: Secondary | ICD-10-CM

## 2020-03-06 DIAGNOSIS — R400 Somnolence: Secondary | ICD-10-CM | POA: Diagnosis not present

## 2020-03-06 DIAGNOSIS — I350 Nonrheumatic aortic (valve) stenosis: Secondary | ICD-10-CM

## 2020-03-06 DIAGNOSIS — I10 Essential (primary) hypertension: Secondary | ICD-10-CM

## 2020-03-06 NOTE — Patient Instructions (Signed)
Medication Instructions:  Continue current medications  *If you need a refill on your cardiac medications before your next appointment, please call your pharmacy*   Lab Work: CMP, TSH, Fasting Lipids, Aldosterone + Renin, Urinalysis  If you have labs (blood work) drawn today and your tests are completely normal, you will receive your results only by:  MyChart Message (if you have MyChart) OR  A paper copy in the mail If you have any lab test that is abnormal or we need to change your treatment, we will call you to review the results.   Testing/Procedures: Your physician has requested that you have an echocardiogram. Echocardiography is a painless test that uses sound waves to create images of your heart. It provides your doctor with information about the size and shape of your heart and how well your hearts chambers and valves are working. This procedure takes approximately one hour. There are no restrictions for this procedure.  Your physician has requested that you have a renal artery duplex. During this test, an ultrasound is used to evaluate blood flow to the kidneys. Allow one hour for this exam. Do not eat after midnight the day before and avoid carbonated beverages. Take your medications as you usually do.  Your physician has recommended that you have a sleep study. This test records several body functions during sleep, including: brain activity, eye movement, oxygen and carbon dioxide blood levels, heart rate and rhythm, breathing rate and rhythm, the flow of air through your mouth and nose, snoring, body muscle movements, and chest and belly movement.   Follow-Up: At [facility], you and your health needs are our priority.  As part of our continuing mission to provide you with exceptional heart care, we have created designated Provider Care Teams.  These Care Teams include your primary Cardiologist (physician) and Advanced Practice Providers (APPs -  Physician Assistants and  Nurse Practitioners) who all work together to provide you with the care you need, when you need it.  We recommend signing up for the patient portal called "MyChart".  Sign up information is provided on this After Visit Summary.  MyChart is used to connect with patients for Virtual Visits (Telemedicine).  Patients are able to view lab/test results, encounter notes, upcoming appointments, etc.  Non-urgent messages can be sent to your provider as well.   To learn more about what you can do with MyChart, go to [email or web address].    Your next appointment:   2 month(s)  The format for your next appointment:   In Person  Provider:   You may see Epifanio Lesches, MD or one of the following Advanced Practice Providers on your designated Care Team:    Theodore Demark, PA-C  Joni Reining, DNP, ANP    Other Instructions Your physician recommends that you schedule a follow-up appointment in: 2 Weeks with Pharmacy HTN clinic  Keep daily blood pressure check for the next 2 weeks

## 2020-03-10 ENCOUNTER — Other Ambulatory Visit: Payer: Self-pay

## 2020-03-10 ENCOUNTER — Ambulatory Visit: Payer: Self-pay | Attending: Family Medicine

## 2020-03-11 ENCOUNTER — Encounter (HOSPITAL_BASED_OUTPATIENT_CLINIC_OR_DEPARTMENT_OTHER): Payer: Medicaid Other | Admitting: Physician Assistant

## 2020-03-11 DIAGNOSIS — I890 Lymphedema, not elsewhere classified: Secondary | ICD-10-CM | POA: Diagnosis not present

## 2020-03-11 NOTE — Progress Notes (Addendum)
[name] ([id number]) Visit Report for 03/11/2020 Chief Complaint Document Details Patient Name: Date of Service: [name], [name] 03/11/2020 12:45 PM Medical Record Number: [id number] Patient Account Number: [id number] Date of Birth/Sex: Treating RN: 12-22-1960 (59 y.o. [name] Primary Care Provider: Delford Field, [place] TRICK Other Clinician: Referring Provider: Treating Provider/Extender: Meribeth Mattes, PA TRICK Weeks in Treatment: 2 Information Obtained from: Patient Chief Complaint Bilateral Leg Ulcers Electronic Signature(s) Signed: 03/11/2020 12:29:15 PM By: Lenda Kelp PA-C Entered By: Lenda Kelp on 03/11/2020 12:29:15 -------------------------------------------------------------------------------- HPI Details Patient Name: Date of Service: [name] 03/11/2020 12:45 PM Medical Record Number: [id number] Patient Account Number: [id number] Date of Birth/Sex: Treating RN: 14-Feb-1961 (59 y.o. [name] Primary Care Provider: Delford Field, [place] TRICK Other Clinician: Referring Provider: Treating Provider/Extender: Meribeth Mattes, PA TRICK Weeks in Treatment: 2 History of Present Illness HPI Description: 02/24/2020 on evaluation today patient presents with obvious chronic lymphedema of the bilateral lower extremities. Fortunately there is no signs of active infection at this time which is great news. With that being said the patient does have a recent treatment with doxycycline 100 mg for 10 days which he has completed. He does have a history of chronic kidney disease stage III as documented in his chart. He also has chronic venous insufficiency with lower extremity lymphedema noted. He has congestive heart failure and hypertension. He tells me currently that he was unaware of the kidney disease before he mentioned this today. He also tells me that his primary care provider had told him to quit taking showers based on what was going on with his legs at this  time. Fortunately there is no signs of active infection at this time which is great news. No fevers, chills, nausea, vomiting, or diarrhea. 03/04/2020 on evaluation today patient actually appears to be doing much better in regard to his legs. There is really not much draining a lot of the dry skin is starting to loosen up amount of time to get as much of this off today as possible 03/11/2020 upon evaluation today patient appears to be doing well currently in regard to his legs. He still has a lot of dry skin some which I was able to get off today but overall I think that the biggest thing he needs right now is to be able to wash his legs in the shower to loosen some of this up which I think will then come off much more effectively and quickly without as much of an issue. Fortunately there is no evidence of active infection at this time. No fevers, chills, nausea, vomiting, or diarrhea. Electronic Signature(s) Signed: 03/11/2020 1:40:09 PM By: Lenda Kelp PA-C Entered By: Lenda Kelp on 03/11/2020 13:40:09 -------------------------------------------------------------------------------- Physical Exam Details Patient Name: Date of Service: [name] 03/11/2020 12:45 PM Medical Record Number: [id number] Patient Account Number: [id number] Date of Birth/Sex: Treating RN: 06/10/1960 (59 y.o. [name] Primary Care Provider: Delford Field, [place] TRICK Other Clinician: Referring Provider: Treating Provider/Extender: Meribeth Mattes, PA TRICK Weeks in Treatment: 2 Constitutional Well-nourished and well-hydrated in no acute distress. Respiratory normal breathing without difficulty. Psychiatric this patient is able to make decisions and demonstrates good insight into disease process. Alert and Oriented x 3. pleasant and cooperative. Notes Upon inspection patient's wound bed actually showed signs of good epithelization at this time. There does not appear to be any evidence of open wounds  which is great news. He does have a lot of dry  skin but again this is not something that currently I am too concerned about other than the fact that I think when he is able to start washing he will be able to get this off much more effectively and quickly. Electronic Signature(s) Signed: 03/11/2020 1:41:56 PM By: Lenda KelpStone III, Hosteen Kienast PA-C Entered By: Lenda KelpStone III, Shamar Kracke on 03/11/2020 13:41:56 -------------------------------------------------------------------------------- Physician Orders Details Patient Name: Date of Service: [name] 03/11/2020 12:45 PM Medical Record Number: [id number] Patient Account Number: [id number] Date of Birth/Sex: Treating RN: May 31, 1960 (59 y.o. Shawn SchoonerM) Boehlein, Linda Primary Care Provider: Delford FieldWRIGHT, [place] TRICK Other Clinician: Referring Provider: Treating Provider/Extender: Meribeth MattesStone III, Sherrika Weakland WRIGHT, PA TRICK Weeks in Treatment: 2 Verbal / Phone Orders: No Diagnosis Coding ICD-10 Coding Code Description I89.0 Lymphedema, not elsewhere classified I87.2 Venous insufficiency (chronic) (peripheral) L97.822 Non-pressure chronic ulcer of other part of left lower leg with fat layer exposed L97.812 Non-pressure chronic ulcer of other part of right lower leg with fat layer exposed N18.30 Chronic kidney disease, stage 3 unspecified I50.42 Chronic combined systolic (congestive) and diastolic (congestive) heart failure I10 Essential (primary) hypertension [facility] Bathing/ Shower/ Hygiene May shower and wash wound with soap and water. - use wash cloth to gently scrub legs in the shower Edema Control - Lymphedema / SCD / Other Bilateral Lower Extremities Elevate legs to the level of the heart or above for 30 minutes daily and/or when sitting, a frequency of: - throughout the day Avoid standing for long periods of time. Exercise regularly Moisturize legs daily. - every evening after removing stockings Compression stocking or  Garment 20-30 mm/Hg pressure to: - apply in the morning and take off at bedtime Electronic Signature(s) Signed: 03/11/2020 4:01:55 PM By: Lenda KelpStone III, Kanon Colunga PA-C Signed: 03/11/2020 6:12:31 PM By: Zenaida DeedBoehlein, Linda RN, BSN Entered By: Zenaida DeedBoehlein, Linda on 03/11/2020 13:38:31 -------------------------------------------------------------------------------- Problem List Details Patient Name: Date of Service: Shawn MedicoRA Y, Leland 03/11/2020 12:45 PM Medical Record Number: [id number] Patient Account Number: [id number] Date of Birth/Sex: Treating RN: May 31, 1960 (59 y.o. Shawn SchoonerM) Boehlein, Linda Primary Care Provider: Delford FieldWRIGHT, [place] TRICK Other Clinician: Referring Provider: Treating Provider/Extender: Meribeth MattesStone III, Jayli Fogleman WRIGHT, PA TRICK Weeks in Treatment: 2 Active Problems ICD-10 Encounter Code Description Active Date MDM Diagnosis I89.0 Lymphedema, not elsewhere classified 02/24/2020 No Yes I87.2 Venous insufficiency (chronic) (peripheral) 02/24/2020 No Yes L97.822 Non-pressure chronic ulcer of other part of left lower leg with fat layer exposed11/22/2021 No Yes L97.812 Non-pressure chronic ulcer of other part of right lower leg with fat layer 02/24/2020 No Yes exposed N18.30 Chronic kidney disease, stage 3 unspecified 02/24/2020 No Yes I50.42 Chronic combined systolic (congestive) and diastolic (congestive) heart failure 02/24/2020 No Yes I10 Essential (primary) hypertension 02/24/2020 No Yes Inactive Problems Resolved Problems Electronic Signature(s) Signed: 03/11/2020 12:29:09 PM By: Lenda KelpStone III, Bentleigh Stankus PA-C Entered By: Lenda KelpStone III, Senan Urey on 03/11/2020 12:29:09 -------------------------------------------------------------------------------- Progress Note Details Patient Name: Date of Service: Shawn MedicoRA Y, Fisher 03/11/2020 12:45 PM Medical Record Number: [id number] Patient Account Number: [id number] Date of Birth/Sex: Treating RN: May 31, 1960 (59 y.o. Shawn SchoonerM) Boehlein, Linda Primary Care Provider: Delford FieldWRIGHT, [place] TRICK Other  Clinician: Referring Provider: Treating Provider/Extender: Meribeth MattesStone III, Cambry Spampinato WRIGHT, PA TRICK Weeks in Treatment: 2 Subjective Chief Complaint Information obtained from Patient Bilateral Leg Ulcers History of Present Illness (HPI) 02/24/2020 on evaluation today patient presents with obvious chronic lymphedema of the bilateral lower extremities. Fortunately there is no signs of active infection at this time which is great news. With that being said the patient does have a  recent treatment with doxycycline 100 mg for 10 days which he has completed. He does have a history of chronic kidney disease stage III as documented in his chart. He also has chronic venous insufficiency with lower extremity lymphedema noted. He has congestive heart failure and hypertension. He tells me currently that he was unaware of the kidney disease before he mentioned this today. He also tells me that his primary care provider had told him to quit taking showers based on what was going on with his legs at this time. Fortunately there is no signs of active infection at this time which is great news. No fevers, chills, nausea, vomiting, or diarrhea. 03/04/2020 on evaluation today patient actually appears to be doing much better in regard to his legs. There is really not much draining a lot of the dry skin is starting to loosen up amount of time to get as much of this off today as possible 03/11/2020 upon evaluation today patient appears to be doing well currently in regard to his legs. He still has a lot of dry skin some which I was able to get off today but overall I think that the biggest thing he needs right now is to be able to wash his legs in the shower to loosen some of this up which I think will then come off much more effectively and quickly without as much of an issue. Fortunately there is no evidence of active infection at this time. No fevers, chills, nausea, vomiting, or  diarrhea. Objective Constitutional Well-nourished and well-hydrated in no acute distress. Vitals Time Taken: 12:58 PM, Height: 69 in, Weight: 228 lbs, BMI: 33.7, Temperature: 97.7 F, Pulse: 66 bpm, Respiratory Rate: 18 breaths/min, Blood Pressure: 164/97 mmHg. Respiratory normal breathing without difficulty. Psychiatric this patient is able to make decisions and demonstrates good insight into disease process. Alert and Oriented x 3. pleasant and cooperative. General Notes: Upon inspection patient's wound bed actually showed signs of good epithelization at this time. There does not appear to be any evidence of open wounds which is great news. He does have a lot of dry skin but again this is not something that currently I am too concerned about other than the fact that I think when he is able to start washing he will be able to get this off much more effectively and quickly. Integumentary (Hair, Skin) Wound #1 status is Open. Original cause of wound was Gradually Appeared. The wound is located on the Left,Anterior Lower Leg. The wound measures 0cm length x 0cm width x 0cm depth; 0cm^2 area and 0cm^3 volume. There is no tunneling or undermining noted. There is a none present amount of drainage noted. The wound margin is indistinct and nonvisible. There is no granulation within the wound bed. There is no necrotic tissue within the wound bed. Wound #2 status is Open. Original cause of wound was Gradually Appeared. The wound is located on the Right,Circumferential Lower Leg. The wound measures 0cm length x 0cm width x 0cm depth; 0cm^2 area and 0cm^3 volume. There is no tunneling or undermining noted. There is a none present amount of drainage noted. The wound margin is indistinct and nonvisible. There is no granulation within the wound bed. There is no necrotic tissue within the wound bed. Assessment Active Problems ICD-10 Lymphedema, not elsewhere classified Venous insufficiency (chronic)  (peripheral) Non-pressure chronic ulcer of other part of left lower leg with fat layer exposed Non-pressure chronic ulcer of other part of right lower leg with fat layer  exposed Chronic kidney disease, stage 3 unspecified Chronic combined systolic (congestive) and diastolic (congestive) heart failure Essential (primary) hypertension Plan [facility]charge From [facility] Services: Discharge from Wound Care Center Bathing/ Shower/ Hygiene: May shower and wash wound with soap and water. - use wash cloth to gently scrub legs in the shower Edema Control - Lymphedema / SCD / Other: Elevate legs to the level of the heart or above for 30 minutes daily and/or when sitting, a frequency of: - throughout the day Avoid standing for long periods of time. Exercise regularly Moisturize legs daily. - every evening after removing stockings Compression stocking or Garment 20-30 mm/Hg pressure to: - apply in the morning and take off at bedtime 1. I would recommend currently that we have the patient go ahead and discontinue wound care services at this point as he does appear to be healed there are no open wounds. Does have a lot of dry skin and he does need compression ongoing but not something that he can work on at home. 2. I am also can recommend currently that we have the patient continue to use a good lotion on his legs at bedtime. He should wear the compression socks during the day take them off at night shower then put lotion on I would recommend you start in particular. 3. I am also can recommend he continue to monitor for any signs of open wounds or worsening if anything occurs he should contact the office and let us know as soon as possible. We will see the patient back for follow-up visit as needed. Electronic Signature(s) Signed: 03/11/2020 1:42:41 PM By: Lenda Kelp PA-C Entered By: Lenda Kelp on 03/11/2020  13:42:40 -------------------------------------------------------------------------------- SuperBill Details Patient Name: Date of Service: LAVONTA, TILLIS 03/11/2020 Medical Record Number: [id number] Patient Account Number: [id number] Date of Birth/Sex: Treating RN: 1960/05/05 (59 y.o. [name] Primary Care Provider: Delford Field, [place] TRICK Other Clinician: Referring Provider: Treating Provider/Extender: Meribeth Mattes, PA TRICK Weeks in Treatment: 2 Diagnosis Coding ICD-10 Codes Code Description I89.0 Lymphedema, not elsewhere classified I87.2 Venous insufficiency (chronic) (peripheral) L97.822 Non-pressure chronic ulcer of other part of left lower leg with fat layer exposed L97.812 Non-pressure chronic ulcer of other part of right lower leg with fat layer exposed N18.30 Chronic kidney disease, stage 3 unspecified I50.42 Chronic combined systolic (congestive) and diastolic (congestive) heart failure I10 Essential (primary) hypertension Facility Procedures CPT4 Code: [id number] Description: 99213 - WOUND CARE VISIT-LEV 3 EST PT Modifier: Quantity: 1 Physician Procedures : CPT4 Code Description Modifier [id number] - WC PHYS LEVEL 3 - EST PT 1 ICD-10 Diagnosis Description I89.0 Lymphedema, not elsewhere classified I87.2 Venous insufficiency (chronic) (peripheral) L97.822 Non-pressure chronic ulcer of other part of left  lower leg with fat layer exposed L97.812 Non-pressure chronic ulcer of other part of right lower leg with fat layer exposed Quantity: Electronic Signature(s) Signed: 03/11/2020 1:43:17 PM By: Lenda Kelp PA-C Entered By: Lenda Kelp on 03/11/2020 13:43:17

## 2020-03-12 ENCOUNTER — Other Ambulatory Visit: Payer: Self-pay

## 2020-03-12 ENCOUNTER — Ambulatory Visit (HOSPITAL_COMMUNITY)
Admission: RE | Admit: 2020-03-12 | Discharge: 2020-03-12 | Disposition: A | Discharge status: Home / Self Care | Payer: Medicaid Other | Source: Ambulatory Visit | Attending: Cardiology | Admitting: Cardiology

## 2020-03-12 DIAGNOSIS — I10 Essential (primary) hypertension: Secondary | ICD-10-CM | POA: Diagnosis present

## 2020-03-12 NOTE — Progress Notes (Signed)
[name] ([id number]) Visit Report for 03/11/2020 Arrival Information Details Patient Name: Date of Service: [name], [name] 03/11/2020 12:45 PM Medical Record Number: [id number] Patient Account Number: [id number] Date of Birth/Sex: Treating RN: 1960/05/31 (59 y.o. [name] Primary Care Elijah Michaelis: Delford Field, [place] TRICK Other Clinician: Referring Avie Checo: Treating Jeananne Bedwell/Extender: Meribeth Mattes, PA TRICK Weeks in Treatment: 2 Visit Information History Since Last Visit Added or deleted any medications: No Patient Arrived: Ambulatory Any new allergies or adverse reactions: No Arrival Time: 12:57 Had a fall or experienced change in No Accompanied By: alone activities of daily living that may affect Transfer Assistance: None risk of falls: Patient Identification Verified: Yes Signs or symptoms of abuse/neglect since last visito No Secondary Verification Process Completed: Yes Hospitalized since last visit: No Patient Requires Transmission-Based No Implantable device outside of the clinic excluding No Precautions: cellular tissue based products placed in the center Patient Has Alerts: Yes since last visit: Patient Alerts: ABI non compressible bil. Has Dressing in Place as Prescribed: Yes Has Compression in Place as Prescribed: Yes Pain Present Now: No Electronic Signature(s) Signed: 03/12/2020 4:05:55 PM By: Zandra Abts RN, BSN Entered By: Zandra Abts on 03/11/2020 12:57:55 -------------------------------------------------------------------------------- Clinic Level of Care Assessment Details Patient Name: Date of Service: [name], [name] 03/11/2020 12:45 PM Medical Record Number: [id number] Patient Account Number: [id number] Date of Birth/Sex: Treating RN: [birth year] (59 y.o. [name] Primary Care Viviane Semidey: Delford Field, [place] TRICK Other Clinician: Referring Ansleigh Safer: Treating Gleen Ripberger/Extender: Meribeth Mattes, PA TRICK Weeks in Treatment: 2 Clinic  Level of Care Assessment Items TOOL 4 Quantity Score []  - 0 Use when only an EandM is performed on FOLLOW-UP visit ASSESSMENTS - Nursing Assessment / Reassessment X- 1 10 Reassessment of Co-morbidities (includes updates in patient status) X- 1 5 Reassessment of Adherence to Treatment Plan ASSESSMENTS - Wound and Skin A ssessment / Reassessment []  - 0 Simple Wound Assessment / Reassessment - one wound X- 2 5 Complex Wound Assessment / Reassessment - multiple wounds X- 1 10 Dermatologic / Skin Assessment (not related to wound area) ASSESSMENTS - Focused Assessment []  - 0 Circumferential Edema Measurements - multi extremities []  - 0 Nutritional Assessment / Counseling / Intervention X- 1 5 Lower Extremity Assessment (monofilament, tuning fork, pulses) []  - 0 Peripheral Arterial Disease Assessment (using hand held doppler) ASSESSMENTS - Ostomy and/or Continence Assessment and Care []  - 0 Incontinence Assessment and Management []  - 0 Ostomy Care Assessment and Management (repouching, etc.) PROCESS - Coordination of Care X - Simple Patient / Family Education for ongoing care 1 15 []  - 0 Complex (extensive) Patient / Family Education for ongoing care X- 1 10 Staff obtains , Records, T Results / Process Orders est []  - 0 Staff telephones HHA, Nursing Homes / Clarify orders / etc []  - 0 Routine Transfer to another Facility (non-emergent condition) []  - 0 Routine Hospital Admission (non-emergent condition) []  - 0 New Admissions / / Ordering NPWT Apligraf, etc. , []  - 0 Emergency Hospital Admission (emergent condition) X- 1 10 Simple Discharge Coordination []  - 0 Complex (extensive) Discharge Coordination PROCESS - Special Needs []  - 0 Pediatric / Minor Patient Management []  - 0 Isolation Patient Management []  - 0 Hearing / Language / Visual special needs []  - 0 Assessment of Community assistance (transportation, D/C planning,  etc.) []  - 0 Additional assistance / Altered mentation []  - 0 Support Surface(s) Assessment (bed, cushion, seat, etc.) INTERVENTIONS - Wound Cleansing / Measurement []  - 0 Simple  Wound Cleansing - one wound X- 2 5 Complex Wound Cleansing - multiple wounds X- 1 5 Wound Imaging (photographs - any number of wounds) []  - 0 Wound Tracing (instead of photographs) []  - 0 Simple Wound Measurement - one wound []  - 0 Complex Wound Measurement - multiple wounds INTERVENTIONS - Wound Dressings []  - 0 Small Wound Dressing one or multiple wounds []  - 0 Medium Wound Dressing one or multiple wounds []  - 0 Large Wound Dressing one or multiple wounds []  - 0 Application of Medications - topical []  - 0 Application of Medications - injection INTERVENTIONS - Miscellaneous []  - 0 External ear exam []  - 0 Specimen Collection (cultures, biopsies, blood, body fluids, etc.) []  - 0 Specimen(s) / Culture(s) sent or taken to Lab for analysis []  - 0 Patient Transfer (multiple staff / / Similar devices) []  - 0 Simple Staple / Suture removal (25 or less) []  - 0 Complex Staple / Suture removal (26 or more) []  - 0 Hypo / Hyperglycemic Management (close monitor of Blood Glucose) []  - 0 Ankle / Brachial Index (ABI) - do not check if billed separately X- 1 5 Vital Signs Has the patient been seen at the hospital within the last three years: Yes Total Score: 95 Level Of Care: New/Established - Level 3 Electronic Signature(s) Signed: 03/11/2020 6:12:31 PM By: RN, BSN Entered By: on 03/11/2020 13:36:03 -------------------------------------------------------------------------------- Encounter Discharge Information Details Patient Name: Date of Service: [name] 03/11/2020 12:45 PM Medical Record Number: Patient Account Number: Date of Birth/Sex: Treating RN: [birth year] (59 y.o. ) Nurse, adult Primary Care Huxton Glaus: , TRICK  Other Clinician: Referring Shakita Keir: Treating Kamyah Wilhelmsen/Extender: , PA TRICK Weeks in Treatment: 2 Encounter Discharge Information Items Discharge Condition: Stable Ambulatory Status: Ambulatory Discharge Destination: Home Transportation: Private Auto Accompanied By: self Schedule Follow-up Appointment: Yes Clinical Summary of Care: Patient Declined Electronic Signature(s) Signed: 03/11/2020 5:41:55 PM By: 14/11/2019 RN Entered By: [name] on 03/11/2020 13:48:46 -------------------------------------------------------------------------------- Lower Extremity Assessment Details Patient Name: Date of Service: [name], [name] 03/11/2020 12:45 PM Medical Record Number: 14/11/2019 Patient Account Number: [id number] Date of Birth/Sex: Treating RN: November 16, 1960 (60 y.o. 05-27-1983 Primary Care Augie Vane: Judie Petit, Yevonne Pax TRICK Other Clinician: Referring Jahaad Penado: Treating Jonathen Rathman/Extender: Delford Field, PA TRICK Weeks in Treatment: 2 Edema Assessment Assessed: [Left: No] [Right: No] Edema: [Left: Yes] [Right: Yes] Calf Left: Right: Point of Measurement: 44 cm From Medial Instep 32 cm 33 cm Ankle Left: Right: Point of Measurement: 12 cm From Medial Instep 23.4 cm 23 cm Knee To Floor Left: Right: From Medial Instep 42 cm 42 cm Vascular Assessment Pulses: Dorsalis Pedis Palpable: [Left:Yes] [Right:Yes] Electronic Signature(s) Signed: 03/12/2020 4:05:55 PM By: Meribeth Mattes RN, BSN Entered By: 14/11/2019 on 03/11/2020 13:14:46 -------------------------------------------------------------------------------- Multi-Disciplinary Care Plan Details Patient Name: Date of Service: BRENNDEN, MASTEN 03/11/2020 12:45 PM Medical Record Number: [name] Patient Account Number: 14/11/2019 Date of Birth/Sex: Treating RN: 1960/06/01 (59 y.o. 01/04/1961 Primary Care Benjimen Kelley: 66, [name] TRICK Other Clinician: Referring Delonte Musich: Treating  Samreet Edenfield/Extender: Delford Field, PA TRICK Weeks in Treatment: 2 Active Inactive Electronic Signature(s) Signed: 03/11/2020 6:12:31 PM By: Meribeth Mattes RN, BSN Entered By: 14/12/2019 on 03/11/2020 13:35:16 -------------------------------------------------------------------------------- Pain Assessment Details Patient Name: Date of Service: CINSERE, [name] 03/11/2020 12:45 PM Medical Record Number: [name] Patient Account Number: 14/11/2019 Date of Birth/Sex: Treating RN: 02/03/1961 (59 y.o. [id number] Primary Care Antwon Rochin: 01/04/1961,  PA TRICK Other Clinician: Referring Locklyn Henriquez: Treating Kazaria Gaertner/Extender: Meribeth MattesStone III, Hoyt WRIGHT, PA TRICK Weeks in Treatment: 2 Active Problems Location of Pain Severity and Description of Pain Patient Has Paino No Site Locations Pain Management and Medication Current Pain Management: Electronic Signature(s) Signed: 03/12/2020 4:05:55 PM By: Zandra AbtsLynch, Shatara RN, BSN Entered By: Zandra AbtsLynch, Shatara on 03/11/2020 12:58:10 -------------------------------------------------------------------------------- Patient/Caregiver Education Details Patient Name: Date of Service: Shawn MedicoRA Y, Octave 12/8/2021andnbsp12:45 PM Medical Record Number: [id number] Patient Account Number: [id number] Date of Birth/Gender: Treating RN: Jun 28, 1960 (59 y.o. Shawn SchoonerM) Boehlein, Linda Primary Care Physician: Delford FieldWRIGHT, [place]PA TRICK Other Clinician: Referring Physician: Treating Physician/Extender: Meribeth MattesStone III, Hoyt WRIGHT, PA TRICK Weeks in Treatment: 2 Education Assessment Education Provided To: Patient Education Topics Provided Venous: Methods: Explain/Verbal Responses: Reinforcements needed, State content correctly Wound/Skin Impairment: Methods: Explain/Verbal Responses: Reinforcements needed, State content correctly Electronic Signature(s) Signed: 03/11/2020 6:12:31 PM By: Zenaida DeedBoehlein, Linda RN, BSN Entered By: Zenaida DeedBoehlein, Linda on 03/11/2020  13:34:17 -------------------------------------------------------------------------------- Wound Assessment Details Patient Name: Date of Service: Shawn MedicoRA Y, Marlee 03/11/2020 12:45 PM Medical Record Number: [id number] Patient Account Number: [id number] Date of Birth/Sex: Treating RN: Jun 28, 1960 (59 y.o. Elizebeth KollerM) Lynch, Shatara Primary Care Jahan Friedlander: Delford FieldWRIGHT, [place]PA TRICK Other Clinician: Referring Malea Swilling: Treating Yolani Vo/Extender: Meribeth MattesStone III, Hoyt WRIGHT, PA TRICK Weeks in Treatment: 2 Wound Status Wound Number: 1 Primary Etiology: Lymphedema Wound Location: Left, Anterior Lower Leg Wound Status: Open Wounding Event: Gradually Appeared Comorbid History: Congestive Heart Failure, Hypertension Date Acquired: 01/03/2020 Weeks Of Treatment: 2 Clustered Wound: No Wound Measurements Length: (cm) Width: (cm) Depth: (cm) Area: (cm) Volume: (cm) 0 % Reduction in Area: 100% 0 % Reduction in Volume: 100% 0 Epithelialization: Large (67-100%) 0 Tunneling: No 0 Undermining: No Wound Description Classification: Full Thickness Without Exposed Support Structures Wound Margin: Indistinct, nonvisible Exudate Amount: None Present Foul Odor After Cleansing: No Slough/Fibrino No Wound Bed Granulation Amount: None Present (0%) Exposed Structure Necrotic Amount: None Present (0%) Fascia Exposed: No Fat Layer (Subcutaneous Tissue) Exposed: No Tendon Exposed: No Muscle Exposed: No Joint Exposed: No Bone Exposed: No Electronic Signature(s) Signed: 03/12/2020 4:05:55 PM By: Zandra AbtsLynch, Shatara RN, BSN Entered By: Zandra AbtsLynch, Shatara on 03/11/2020 13:13:31 -------------------------------------------------------------------------------- Wound Assessment Details Patient Name: Date of Service: Shawn MedicoRA Y, [place] 03/11/2020 12:45 PM Medical Record Number: [id number] Patient Account Number: [id number] Date of Birth/Sex: Treating RN: Jun 28, 1960 (59 y.o. Elizebeth KollerM) Lynch, Shatara Primary Care Ettamae Barkett: Delford FieldWRIGHT, [place]PA TRICK Other  Clinician: Referring Jaydan Meidinger: Treating Kamira Mellette/Extender: Meribeth MattesStone III, Hoyt WRIGHT, PA TRICK Weeks in Treatment: 2 Wound Status Wound Number: 2 Primary Etiology: Lymphedema Wound Location: Right, Circumferential Lower Leg Wound Status: Open Wounding Event: Gradually Appeared Comorbid History: Congestive Heart Failure, Hypertension Date Acquired: 01/03/2020 Weeks Of Treatment: 2 Clustered Wound: No Wound Measurements Length: (cm) Width: (cm) Depth: (cm) Area: (cm) Volume: (cm) 0 % Reduction in Area: 100% 0 % Reduction in Volume: 100% 0 Epithelialization: Large (67-100%) 0 Tunneling: No 0 Undermining: No Wound Description Classification: Full Thickness Without Exposed Support Structures Wound Margin: Indistinct, nonvisible Exudate Amount: None Present Foul Odor After Cleansing: No Slough/Fibrino No Wound Bed Granulation Amount: None Present (0%) Exposed Structure Necrotic Amount: None Present (0%) Fascia Exposed: No Fat Layer (Subcutaneous Tissue) Exposed: No Tendon Exposed: No Muscle Exposed: No Joint Exposed: No Bone Exposed: No Electronic Signature(s) Signed: 03/12/2020 4:05:55 PM By: Zandra AbtsLynch, Shatara RN, BSN Entered By: Zandra AbtsLynch, Shatara on 03/11/2020 13:13:48 -------------------------------------------------------------------------------- Vitals Details Patient Name: Date of Service: Shawn MedicoRA Y, Silverio 03/11/2020 12:45 PM Medical Record Number: [id number] Patient Account Number: [id number] Date of Birth/Sex: Treating RN: Jun 28, 1960 (59 y.o.  [name] Primary Care Loy Mccartt: Delford Field, [place] TRICK Other Clinician: Referring Madden Garron: Treating Davon Folta/Extender: Meribeth Mattes, PA TRICK Weeks in Treatment: 2 Vital Signs Time Taken: 12:58 Temperature (F): 97.7 Height (in): 69 Pulse (bpm): 66 Weight (lbs): 228 Respiratory Rate (breaths/min): 18 Body Mass Index (BMI): 33.7 Blood Pressure (mmHg): 164/97 Reference Range: 80 - 120 mg / dl Electronic  Signature(s) Signed: 03/12/2020 4:05:55 PM By: Zandra Abts RN, BSN Entered By: Zandra Abts on 03/11/2020 12:58:32

## 2020-03-16 ENCOUNTER — Ambulatory Visit: Payer: Self-pay | Admitting: Critical Care Medicine

## 2020-03-16 LAB — COMPREHENSIVE METABOLIC PANEL
ALT: 8 IU/L (ref 0–44)
AST: 12 IU/L (ref 0–40)
Albumin/Globulin Ratio: 0.9 — ABNORMAL LOW (ref 1.2–2.2)
Albumin: 3.7 g/dL — ABNORMAL LOW (ref 3.8–4.9)
Alkaline Phosphatase: 75 IU/L (ref 44–121)
BUN/Creatinine Ratio: 13 (ref 10–24)
BUN: 16 mg/dL (ref 8–27)
Bilirubin Total: 0.5 mg/dL (ref 0.0–1.2)
CO2: 21 mmol/L (ref 20–29)
Calcium: 9.6 mg/dL (ref 8.6–10.2)
Chloride: 101 mmol/L (ref 96–106)
Creatinine, Ser: 1.24 mg/dL (ref 0.76–1.27)
GFR calc Af Amer: 73 mL/min/{1.73_m2} (ref >59)
GFR calc non Af Amer: 63 mL/min/{1.73_m2} (ref >59)
Globulin, Total: 4.1 g/dL (ref 1.5–4.5)
Glucose: 97 mg/dL (ref 65–99)
Potassium: 3.9 mmol/L (ref 3.5–5.2)
Sodium: 138 mmol/L (ref 134–144)
Total Protein: 7.8 g/dL (ref 6.0–8.5)

## 2020-03-16 LAB — ALDOSTERONE + RENIN ACTIVITY W/ RATIO
ALDOS/RENIN RATIO: >31.1 — ABNORMAL HIGH (ref 0.0–30.0)
ALDOSTERONE: 5.2 ng/dL (ref 0.0–30.0)
Renin: <0.167 ng/mL/hr — ABNORMAL LOW (ref 0.167–5.380)

## 2020-03-16 LAB — LIPID PANEL
Chol/HDL Ratio: 4.2 ratio (ref 0.0–5.0)
Cholesterol, Total: 135 mg/dL (ref 100–199)
HDL: 32 mg/dL — ABNORMAL LOW (ref >39)
LDL Chol Calc (NIH): 88 mg/dL (ref 0–99)
Triglycerides: 73 mg/dL (ref 0–149)
VLDL Cholesterol Cal: 15 mg/dL (ref 5–40)

## 2020-03-16 LAB — URINALYSIS
Bilirubin, UA: NEGATIVE
Glucose, UA: NEGATIVE
Ketones, UA: NEGATIVE
Leukocytes,UA: NEGATIVE
Nitrite, UA: NEGATIVE
RBC, UA: NEGATIVE
Specific Gravity, UA: 1.018 (ref 1.005–1.030)
Urobilinogen, Ur: 0.2 mg/dL (ref 0.2–1.0)
pH, UA: 5.5 (ref 5.0–7.5)

## 2020-03-16 LAB — TSH: TSH: 1.37 u[IU]/mL (ref 0.450–4.500)

## 2020-03-17 ENCOUNTER — Other Ambulatory Visit (HOSPITAL_COMMUNITY): Payer: Self-pay

## 2020-03-17 ENCOUNTER — Telehealth: Payer: Self-pay | Admitting: Cardiology

## 2020-03-17 NOTE — Telephone Encounter (Signed)
New message:     Patient fell yesterday and think he might have passed out and his wife stated he missed one of his medication.  Patient also states that his right arm be swelling and then it will go down.

## 2020-03-17 NOTE — Telephone Encounter (Signed)
Left message to call back  Advised if has another syncopal episode to proceed to ER to be evaluated.

## 2020-03-19 ENCOUNTER — Other Ambulatory Visit: Payer: Self-pay

## 2020-03-19 ENCOUNTER — Ambulatory Visit (INDEPENDENT_AMBULATORY_CARE_PROVIDER_SITE_OTHER): Payer: Medicaid Other | Admitting: Pharmacist Clinician (PhC)/ Clinical Pharmacy Specialist

## 2020-03-19 DIAGNOSIS — I10 Essential (primary) hypertension: Secondary | ICD-10-CM
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
## 2020-03-19 NOTE — Patient Instructions (Signed)
Return for a a follow up appointment January 4 at 10 am  Check your blood pressure at home daily and keep record of the readings.  Please go to the [facility] (on [facility] hospital grounds) to have your hand x-rayed  Take your BP meds as follows:  Continue with your current medications  Bring all of your meds, your BP cuff and your record of home blood pressures to your next appointment.  Exercise as you're able, try to walk approximately 30 minutes per day.  Keep salt intake to a minimum, especially watch canned and prepared boxed foods.  Eat more fresh fruits and vegetables and fewer canned items.  Avoid eating in fast food restaurants.    HOW TO TAKE YOUR BLOOD PRESSURE: . Rest 5 minutes before taking your blood pressure. .  Don't smoke or drink caffeinated beverages for at least 30 minutes before. . Take your blood pressure before (not after) you eat. . Sit comfortably with your back supported and both feet on the floor (don't cross your legs). . Elevate your arm to heart level on a table or a desk. . Use the proper sized cuff. It should fit smoothly and snugly around your bare upper arm. There should be enough room to slip a fingertip under the cuff. The bottom edge of the cuff should be 1 inch above the crease of the elbow. . Ideally, take 3 measurements at one sitting and record the average.

## 2020-03-20 ENCOUNTER — Telehealth: Payer: Self-pay | Admitting: Cardiology

## 2020-03-20 ENCOUNTER — Encounter: Payer: Self-pay | Admitting: Pharmacist Clinician (PhC)/ Clinical Pharmacy Specialist

## 2020-03-20 NOTE — Assessment & Plan Note (Signed)
Patient with essential hypertension, very well controlled today.  Had another PharmD double check his pressure reading, but was confirmed at 100/62.  Asked patient to continue with current medications and keep track of home BP readings for the next 3-4 weeks.  He should bring all of his medications, his home cuff and list of home readings.  Also suggested that he could bring his wife if that would help.   Patient was strongly encouraged to go to [facility] or another, and get hand/arm x-rayed.

## 2020-03-20 NOTE — Telephone Encounter (Signed)
Patient is a self pay, so no pre-auth needed for split night.  Waiting on sleep lab to call back to schedule.

## 2020-03-23 NOTE — Telephone Encounter (Signed)
Called patient and told him he is scheduled for Tuesday, Feb. 1 at 8 pm, to expect the info packet and gave him the sleep lab number.

## 2020-03-24 NOTE — Telephone Encounter (Signed)
Spoke with patient. He says he has had no further issues with syncope and states he is feeling well this morning. Advised to be evaluated if he has any further issues. The patient verbalizes understanding and agreement with plan.

## 2020-04-01 ENCOUNTER — Other Ambulatory Visit: Payer: Self-pay | Admitting: *Deleted

## 2020-04-01 MED ORDER — AMLODIPINE BESYLATE 10 MG PO TABS: 10.0000 mg | ORAL_TABLET | Freq: Every day | ORAL | 3 refills | Status: DC

## 2020-04-01 MED ORDER — CLONIDINE HCL 0.2 MG PO TABS: 0.2000 mg | ORAL_TABLET | Freq: Three times a day (TID) | ORAL | 3 refills | Status: DC

## 2020-04-01 MED ORDER — CARVEDILOL 25 MG PO TABS: 25.0000 mg | ORAL_TABLET | Freq: Two times a day (BID) | ORAL | 3 refills | Status: DC

## 2020-04-01 MED ORDER — FUROSEMIDE 40 MG PO TABS: 40.0000 mg | ORAL_TABLET | Freq: Two times a day (BID) | ORAL | 3 refills | Status: DC

## 2020-04-01 MED ORDER — POTASSIUM CHLORIDE ER 10 MEQ PO TBCR: 10.0000 meq | EXTENDED_RELEASE_TABLET | Freq: Two times a day (BID) | ORAL | 3 refills | Status: DC

## 2020-04-01 MED ORDER — HYDRALAZINE HCL 50 MG PO TABS: 50.0000 mg | ORAL_TABLET | Freq: Three times a day (TID) | ORAL | 3 refills | Status: DC

## 2020-04-01 MED ORDER — ATORVASTATIN CALCIUM 20 MG PO TABS: 20.0000 mg | ORAL_TABLET | Freq: Every day | ORAL | 3 refills | Status: DC

## 2020-04-07 ENCOUNTER — Ambulatory Visit (INDEPENDENT_AMBULATORY_CARE_PROVIDER_SITE_OTHER): Payer: Medicaid Other | Admitting: Pharmacist

## 2020-04-07 ENCOUNTER — Other Ambulatory Visit: Payer: Self-pay

## 2020-04-07 VITALS — BP 230/134 | HR 66 | Wt 203.4 lb

## 2020-04-07 DIAGNOSIS — I10 Essential (primary) hypertension: Secondary | ICD-10-CM | POA: Diagnosis not present

## 2020-04-07 NOTE — Patient Instructions (Addendum)
It was good meeting you today!  We would like your blood pressure to be less than 130/80  Continue your amlodipine 10mg  daily Continue your furosemide 40mg  daily Continue your hydralazine 50mg  three times a day Continue your carvedilol 25 mg twice a day.  You can take two 12.5mg  tablets twice a day until they are finished. Continue your clonidine 0.2mg  three times a day  Please go to [facility] and refill your medications   Your myChart username is [email or web address].  Your password is [email or web address].   , PharmD, BCACP, CDCES, CPP [facility] [address], [place], [facility] [address] Phone: [phone]; Fax: [phone] 04/07/2020 10:37 AM
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
## 2020-04-07 NOTE — Progress Notes (Signed)
Heart and Vascular Care Navigation  04/07/2020  [name] 07-26-1960 [id number]  Reason for Referral:  Self-Pay (medicaid pending), financial concerns                                                                                                   Assessment:                                     CSW spoke with pt during his visit with PharmD today at [facility]. Introduced self, role, reason for visit. Pt shares that he applied for Medicaid but was told it would take about 90 days (which I could unfortunately confirm). He also has been working with [name] at [facility] where he sees [name] for PCP. Per chart pt medications are covered at [facility] pharmacy through 11/22. CSW provided my contact card and let pt know should something not make sense or a cost arise that he is confused about to give me a call.  Pt has been staying with his wife and mother in law at her house. House costs were in his mother in law's name and his and now since she has gone to a nursing home the costs are all now in his name and he is concerned he is not going to be able to manage those this month. His rent is roughly $895 and he is not sure what total costs of utilities will be this month. I told him to gather all of his costs and that I would call him before the end of the week to see what programs and referrals he may be eligible for to assist with these costs.   CSW received permission to speak with pt/pt wife [name] [phone]), he also gives permission for me to reach out to [facility] and [facility] for Housing and [employer] Resources.   HRT/VAS Care Coordination    Patients Home Cardiology Office [facility]   Outpatient Care Team Social Worker   Social Worker Name: Shawn Graves, LCSW, [facility]   Living arrangements for the past 2 months Single Family Home   Lives with: Parents; Spouse   Patient Current Insurance Coverage [facility] Assistance; Self-Pay   Patient Has Concern With Paying Medical Bills Yes    Patient Concerns With Medical Bills Medicaid pending   Medical Bill Referrals: Advice worker   Does Patient Have Prescription Coverage? Yes   Patient Prescription Assistance Programs Blue Card   Blue Card Medications medications all covered through [facility] pharmacy   Home Assistive Devices/Equipment None   DME Agency NA   [facility] Agency NA      Social History:  SDOH Screenings   Alcohol Screen: Not on file  Depression (PHQ2-9): Low Risk   . PHQ-2 Score: 0  Financial Resource Strain: High Risk  . Difficulty of Paying Living Expenses: Very hard  Food Insecurity: Not on file  Housing: Medium Risk  . Last Housing Risk Score: 1  Physical Activity: Not on file  Social Connections: Not on file  Stress: Not on file  Tobacco Use: Low Risk   . Smoking Tobacco Use: Never Smoker  . Smokeless Tobacco Use: Never Used  Transportation Needs: No Transportation Needs  . Lack of Transportation (Medical): No  . Lack of Transportation (Non-Medical): No    SDOH Interventions: Financial Resources:  Corporate treasurer Interventions: Chiropodist (Comment) [employer] [facility] for Housing and [employer] Studies)   Food Insecurity:   not asked  Housing Insecurity:  Housing Interventions: Other (Comment) [facility] for Housing and Community Studies)  Transportation:   Transportation Interventions: Intervention Not Press photographer    Other Care Navigation Interventions:     Provided Pharmacy assistance resources [employer] Card   Follow-up plan:   CSW will f/u with [name] at [facility] and verify if pt has any outstanding applications/assistance that I can help him with completing. I also will reach back out to pt before the end of the week and connect him with [facility] for Housing and [employer]. Pt given my card for any additional questions or concerns.

## 2020-04-10 ENCOUNTER — Telehealth: Payer: Self-pay | Admitting: Licensed Clinical Social Worker

## 2020-04-10 NOTE — Telephone Encounter (Signed)
CSW called pt this morning for general f/u and to see if he had been able to connect with [facility] for Housing and Community Studies (referral made 1/5). No answer at [phone]. CSW left HIPAA compliant message for pt and requested a call back. CSW also reached out to [place] w/ financial counseling at [facility] to determine which applications are in process and if there is anything else that we can assist with at this time.   Octavio Graves, MSW, LCSW [facility] Heart/Vascular Care Navigation  [phone]

## 2020-04-13 ENCOUNTER — Telehealth: Payer: Self-pay | Admitting: Licensed Clinical Social Worker

## 2020-04-13 NOTE — Telephone Encounter (Signed)
LCSW f/u also w/ pt in regards to [facility] for Housing and [employer] Resources. Pt states he doesn't think they have f/u with him in regards to rental assistance. I provided him with their number and encouraged him to f/u with them since I made a referral at the end of last week. He shares he will do so today. He will get back with me by Thursday if he has yet to hear from them- let him know generally it takes about 24 hrs before they return referral calls. LCSW remains available for ongoing patient assistance.   Octavio Graves, MSW, LCSW [facility] Heart/Vascular Care Navigation  [phone]

## 2020-04-13 NOTE — Telephone Encounter (Signed)
CSW spoke with pt this morning via telephone. Re-introduced self, role, reason for call. Let pt know that I had been able to confirm that pt has an active Medicaid application pending as of 03/05/2020. Pt should still begin working on CAFA and gather those documents to have on file in case his Medicaid application should be denied. Pt states understanding. He is okay with me sending those documents via mail and he will start them and arrange a time to meet with me in the clinic to make sure the documents are completed. Since I did not ask last time we spoke I asked pt if he was having any current challenges around obtaining or affording food- pt denies any such concerns at this time. CSW placed CAFA application, my card, and [id number] form in an envelope and mailed to pt. Care Navigation remains available as needed moving forward.   Octavio Graves, MSW, LCSW [facility] Heart/Vascular Care Navigation  [phone]

## 2020-04-16 ENCOUNTER — Ambulatory Visit (HOSPITAL_COMMUNITY): Payer: Medicaid Other | Attending: Cardiology

## 2020-04-16 ENCOUNTER — Other Ambulatory Visit: Payer: Self-pay

## 2020-04-16 DIAGNOSIS — I350 Nonrheumatic aortic (valve) stenosis: Secondary | ICD-10-CM

## 2020-04-16 LAB — ECHOCARDIOGRAM COMPLETE
AR max vel: 1.08 cm2
AV Area VTI: 1.12 cm2
AV Area mean vel: 0.99 cm2
AV Mean grad: 37.5 mmHg
AV Peak grad: 64.8 mmHg
Ao pk vel: 4.03 m/s
Area-P 1/2: 4.6 cm2
P 1/2 time: 407 msec
S' Lateral: 3.2 cm

## 2020-04-16 MED ORDER — PERFLUTREN LIPID MICROSPHERE
1.0000 mL | INTRAVENOUS | Status: AC | PRN
  Administered 2020-04-16: 1 mL via INTRAVENOUS

## 2020-04-21 ENCOUNTER — Telehealth: Payer: Self-pay | Admitting: Licensed Clinical Social Worker

## 2020-04-21 ENCOUNTER — Ambulatory Visit: Payer: Self-pay | Admitting: Critical Care Medicine

## 2020-04-21 NOTE — Telephone Encounter (Signed)
Followed up with pt regarding CAFA application that I had mailed.  Await response from pt to determine if it has been received yet.   Octavio Graves, MSW, LCSW [facility] Heart/Vascular Care Navigation  [phone]
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
## 2020-04-27 ENCOUNTER — Telehealth: Payer: Self-pay | Admitting: Licensed Clinical Social Worker

## 2020-04-27 NOTE — Telephone Encounter (Signed)
CSW spoke with pt via telephone at [phone]. Re-introduced self, role, reason for call. Pt states that he has still not received the CAFA application I had mailed. He is okay with me resending it and I reconfirmed his address. Pt will call me when he has received them. He has spoken with [facility] for Housing and [employer]. He states he hasnt heard again since he completed his application for rental/utility assistance. I encouraged him to call if he has any additional questions and [employer] may be able to see the outstanding application status. Pt states understanding.  CSW will continue to follow as needed.   Octavio Graves, MSW, LCSW [facility] Health Heart/Vascular Care Navigation  [phone]

## 2020-04-29 ENCOUNTER — Other Ambulatory Visit: Payer: Self-pay

## 2020-04-29 ENCOUNTER — Encounter: Payer: Self-pay | Admitting: Cardiology

## 2020-04-29 ENCOUNTER — Ambulatory Visit (INDEPENDENT_AMBULATORY_CARE_PROVIDER_SITE_OTHER): Payer: Medicaid Other | Admitting: Cardiology

## 2020-04-29 ENCOUNTER — Telehealth: Payer: Self-pay | Admitting: Licensed Clinical Social Worker

## 2020-04-29 VITALS — BP 134/88 | HR 54 | Ht 68.0 in | Wt 202.6 lb

## 2020-04-29 DIAGNOSIS — Z79899 Other long term (current) drug therapy: Secondary | ICD-10-CM

## 2020-04-29 DIAGNOSIS — I5032 Chronic diastolic (congestive) heart failure: Secondary | ICD-10-CM | POA: Diagnosis not present

## 2020-04-29 DIAGNOSIS — R9431 Abnormal electrocardiogram [ECG] [EKG]: Secondary | ICD-10-CM

## 2020-04-29 DIAGNOSIS — I350 Nonrheumatic aortic (valve) stenosis: Secondary | ICD-10-CM | POA: Diagnosis not present

## 2020-04-29 DIAGNOSIS — I10 Essential (primary) hypertension: Secondary | ICD-10-CM

## 2020-04-29 DIAGNOSIS — R400 Somnolence: Secondary | ICD-10-CM

## 2020-04-29 DIAGNOSIS — E785 Hyperlipidemia, unspecified: Secondary | ICD-10-CM

## 2020-04-29 DIAGNOSIS — K551 Chronic vascular disorders of intestine: Secondary | ICD-10-CM

## 2020-04-29 NOTE — Telephone Encounter (Signed)
Pt called this Clinical research associate from [phone]. He is speaking with his landlord and needed the phone number again for [facility] for Housing and [employer]. I provided Stefanie Ledwell's team number. Let pt know I remain available to assist as needed.   Octavio Graves, MSW, LCSW [facility] Health Heart/Vascular Care Navigation  [phone]

## 2020-04-29 NOTE — Patient Instructions (Signed)
Medication Instructions:  Your Physician recommend you continue on your current medication as directed.    *If you need a refill on your cardiac medications before your next appointment, please call your pharmacy*   Lab Work: Your physician recommends lab work today (BMP, Mg).  If you have labs (blood work) drawn today and your tests are completely normal, you will receive your results only by: Marland Kitchen MyChart Message (if you have MyChart) OR . A paper copy in the mail If you have any lab test that is abnormal or we need to change your treatment, we will call you to review the results.   Testing/Procedures: Your physician has requested that you have an echocardiogram in 6 months. Echocardiography is a painless test that uses sound waves to create images of your heart. It provides your doctor with information about the size and shape of your heart and how well your heart's chambers and valves are working. This procedure takes approximately one hour. There are no restrictions for this procedure. [address]   Follow-Up: At [facility], you and your health needs are our priority.  As part of our continuing mission to provide you with exceptional heart care, we have created designated Provider Care Teams.  These Care Teams include your primary Cardiologist (physician) and Advanced Practice Providers (APPs -  Physician Assistants and Nurse Practitioners) who all work together to provide you with the care you need, when you need it.  We recommend signing up for the patient portal called "MyChart".  Sign up information is provided on this After Visit Summary.  MyChart is used to connect with patients for Virtual Visits (Telemedicine).  Patients are able to view lab/test results, encounter notes, upcoming appointments, etc.  Non-urgent messages can be sent to your provider as well.   To learn more about what you can do with MyChart, go to [email or web address].    Your next  appointment:   3 month(s)  The format for your next appointment:   In Person  Provider:   Epifanio Lesches, MD

## 2020-04-29 NOTE — Telephone Encounter (Signed)
Pt and pt significant other called this Clinical research associate again, they explained that landlord had come by and spoken with them since they are nearing on being behind two months of rent.  LCSW inquired if pt had contacted [facility] for Housing and Community Studies regarding rental application assistance and eviction mediation- he shared he has not at this time- I again urged him to contact them as previously advised by this Clinical research associate and provided him and his significant other with the numbers.  Pt confirmed he would do so.  CSW will f/u again this week to ensure he has done such/gotten in touch with them.    Octavio Graves, MSW, LCSW [facility] Health Heart/Vascular Care Navigation  [phone]

## 2020-04-30 LAB — BASIC METABOLIC PANEL
BUN/Creatinine Ratio: 18 (ref 10–24)
BUN: 23 mg/dL (ref 8–27)
CO2: 21 mmol/L (ref 20–29)
Calcium: 9.2 mg/dL (ref 8.6–10.2)
Chloride: 107 mmol/L — ABNORMAL HIGH (ref 96–106)
Creatinine, Ser: 1.29 mg/dL — ABNORMAL HIGH (ref 0.76–1.27)
GFR calc Af Amer: 69 mL/min/{1.73_m2} (ref >59)
GFR calc non Af Amer: 60 mL/min/{1.73_m2} (ref >59)
Glucose: 97 mg/dL (ref 65–99)
Potassium: 3.8 mmol/L (ref 3.5–5.2)
Sodium: 141 mmol/L (ref 134–144)

## 2020-04-30 LAB — MAGNESIUM: Magnesium: 1.8 mg/dL (ref 1.6–2.3)

## 2020-05-01 ENCOUNTER — Telehealth: Payer: Self-pay | Admitting: Licensed Clinical Social Worker

## 2020-05-01 NOTE — Telephone Encounter (Signed)
LCSW called pt and confirmed that he has been able to get in touch with Stefanie at the [facility] for Housing and [employer]. He and his family have completed the paperwork for rental assistance. Their landlord has also spoken with Stefanie and her team.  Pt still has not received CAFA, it has been mailed twice, I resent it on 1/24. Shared if he doesn't receive it by next Wednesday he should give me a call and I can arrange for pick up here at the clinic. Pt in agreement. Remain available moving forward for any assistance.   [name], MSW, LCSW [facility] Health Heart/Vascular Care Navigation  [phone]

## 2020-05-05 ENCOUNTER — Telehealth: Payer: Self-pay | Admitting: Licensed Clinical Social Worker

## 2020-05-05 ENCOUNTER — Other Ambulatory Visit: Payer: Self-pay

## 2020-05-05 ENCOUNTER — Ambulatory Visit (HOSPITAL_BASED_OUTPATIENT_CLINIC_OR_DEPARTMENT_OTHER): Payer: Medicaid Other | Attending: Cardiology | Admitting: Cardiovascular Disease

## 2020-05-05 VITALS — Ht 69.0 in | Wt 203.0 lb

## 2020-05-05 DIAGNOSIS — G4733 Obstructive sleep apnea (adult) (pediatric): Secondary | ICD-10-CM | POA: Insufficient documentation

## 2020-05-05 DIAGNOSIS — R400 Somnolence: Secondary | ICD-10-CM | POA: Diagnosis present

## 2020-05-05 DIAGNOSIS — I10 Essential (primary) hypertension: Secondary | ICD-10-CM

## 2020-05-05 NOTE — Telephone Encounter (Signed)
LCSW reached out to pt via telephone at [phone]. Pt confirms that he has received CAFA forms, he will review and understands that he needs to collect several documents noted on application before completing and sending it in. Pt encouraged to call this writer before sending in application completely.   Octavio Graves, MSW, LCSW [facility] Health Heart/Vascular Care Navigation  [phone]

## 2020-05-05 NOTE — Telephone Encounter (Signed)
Pt called this writer back- pt inquring about where to send application as he was "going to complete and send it today." I reminded pt that he needs to gather all documents requested prior to submitting the documents. While on the phone pt was able to locate the sheet indicating documents needed. We went section by section through requested documents and noted which ones pertain to pt. He will work on gathering those and I will f/u with him tomorrow to schedule a time to review paperwork prior to scheduling a time to review with Mikle Bosworth at [facility]. Pt understands that given that he has a Medicaid/disability application pending they will not be able to process CAFA until there is a determination.   Octavio Graves, MSW, LCSW [facility] Health Heart/Vascular Care Navigation  [phone]

## 2020-05-14 ENCOUNTER — Telehealth: Payer: Self-pay | Admitting: Licensed Clinical Social Worker

## 2020-05-14 NOTE — Telephone Encounter (Signed)
LCSW reached out to pt via telephone at [phone].  Pt confirmed he has started his CAFA forms and gathered documents.  He does need another 4506-T, I have placed that in the mail and explained what it needs.  Pt understands that until he hears a Medicaid determination that they will not be able to process CAFA. Pt also has been keeping in touch with the [facility] for Housing and Community Studies regarding rental/utility assistance.   I will f/u again with pt as able regarding CAFA, I recommended he call and schedule an appointment with Mikle Bosworth, financial counselor at [facility], to review documents and identify if there is is anything missing, pt understands.   [name], MSW, LCSW [facility] Health Heart/Vascular Care Navigation  [phone]

## 2020-05-17 NOTE — Procedures (Signed)
    Patient Name: [name], [name] Date: 05/05/2020 Gender: Male D.O.B: 02-Mar-1961 Age (years): 59 Referring Provider: Epifanio Lesches Height (inches): 69 Interpreting Physician: Armanda Magic MD, ABSM Weight (lbs): 203 RPSGT: Armen Pickup BMI: 30 MRN: [id number] Neck Size: 16.00  CLINICAL INFORMATION Sleep Study Type: NPSG  Indication for sleep study: Hypertension  Epworth Sleepiness Score: 6  SLEEP STUDY TECHNIQUE As per the AASM Manual for the Scoring of Sleep and Associated Events v2.3 (April 2016) with a hypopnea requiring 4% desaturations.  The channels recorded and monitored were frontal, central and occipital EEG, electrooculogram (EOG), submentalis EMG (chin), nasal and oral airflow, thoracic and abdominal wall motion, anterior tibialis EMG, snore microphone, electrocardiogram, and pulse oximetry.  MEDICATIONS Medications self-administered by patient taken the night of the study : N/A  SLEEP ARCHITECTURE The study was initiated at 12:07:27 AM and ended at 6:14:06 AM.  Sleep onset time was 1.5 minutes and the sleep efficiency was 63.1%. The total sleep time was 231.5 minutes.  Stage REM latency was 104.5 minutes.  The patient spent 6.0% of the night in stage N1 sleep, 78.6% in stage N2 sleep, 0.0% in stage N3 and 15.3% in REM.  Alpha intrusion was absent.  Supine sleep was 100.00%.  RESPIRATORY PARAMETERS The overall apnea/hypopnea index (AHI) was 72.1 per hour. There were 16 total apneas, including 9 obstructive, 7 central and 0 mixed apneas. There were 262 hypopneas and 16 RERAs.  The AHI during Stage REM sleep was 57.5 per hour.  AHI while supine was 72.1 per hour.  The mean oxygen saturation was 92.3%. The minimum SpO2 during sleep was 83.0%.  soft snoring was noted during this study.  CARDIAC DATA The 2 lead EKG demonstrated sinus rhythm. The mean heart rate was 62.0 beats per minute. Other EKG findings include: None.  LEG MOVEMENT DATA The  total PLMS were 0 with a resulting PLMS index of 0.0. Associated arousal with leg movement index was 0.0 .  IMPRESSIONS - Severe obstructive sleep apnea occurred during this study (AHI = 72.1/h). - No significant central sleep apnea occurred during this study (CAI = 1.8/h). - Mild oxygen desaturation was noted during this study (Min O2 = 83.0%). - The patient snored with soft snoring volume. - No cardiac abnormalities were noted during this study. - Clinically significant periodic limb movements did not occur during sleep. No significant associated arousals.  DIAGNOSIS - Obstructive Sleep Apnea (G47.33)  RECOMMENDATIONS - Therapeutic CPAP titration to determine optimal pressure required to alleviate sleep disordered breathing. - Positional therapy avoiding supine position during sleep. - Avoid alcohol, sedatives and other CNS depressants that may worsen sleep apnea and disrupt normal sleep architecture. - Sleep hygiene should be reviewed to assess factors that may improve sleep quality. - Weight management and regular exercise should be initiated or continued if appropriate.  [Electronically signed] 05/17/2020 09:31 AM  Armanda Magic MD, ABSM Diplomate, American Board of Sleep Medicine

## 2020-05-19 ENCOUNTER — Ambulatory Visit: Payer: Self-pay | Admitting: Cardiology

## 2020-05-29 ENCOUNTER — Telehealth: Payer: Self-pay | Admitting: *Deleted

## 2020-05-29 DIAGNOSIS — R400 Somnolence: Secondary | ICD-10-CM

## 2020-05-29 NOTE — Telephone Encounter (Signed)
Informed patient of sleep study results and patient understanding was verbalized. Patient understands his sleep study showed they have sleep apnea and recommend CPAP titration. Please set up titration in the sleep lab.   Pt is aware of his results.  Titration sent to sleep pool.              

## 2020-05-29 NOTE — Telephone Encounter (Signed)
-----   Message from Quintella Reichert, MD sent at 05/17/2020  9:35 AM EST ----- Please let patient know that they have sleep apnea and recommend CPAP titration. Please set up titration in the sleep lab.

## 2020-06-02 ENCOUNTER — Ambulatory Visit: Payer: Self-pay | Admitting: *Deleted

## 2020-06-02 ENCOUNTER — Telehealth: Payer: Self-pay | Admitting: *Deleted

## 2020-06-02 ENCOUNTER — Other Ambulatory Visit: Payer: Self-pay

## 2020-06-02 ENCOUNTER — Ambulatory Visit: Payer: Medicaid Other | Admitting: Physician Assistant

## 2020-06-02 VITALS — BP 137/80 | HR 69 | Temp 98.7°F | Resp 18 | Ht 70.0 in | Wt 200.0 lb

## 2020-06-02 DIAGNOSIS — R600 Localized edema: Secondary | ICD-10-CM | POA: Diagnosis not present

## 2020-06-02 DIAGNOSIS — B86 Scabies: Secondary | ICD-10-CM

## 2020-06-02 MED ORDER — PERMETHRIN 5 % EX CREA
1.0000 | TOPICAL_CREAM | Freq: Once | CUTANEOUS | 0 refills | Status: AC
Start: 2020-06-02 — End: 2020-06-02

## 2020-06-02 MED ORDER — HYDROXYZINE HCL 10 MG PO TABS: 10.0000 mg | ORAL_TABLET | Freq: Three times a day (TID) | ORAL | 0 refills | Status: DC | PRN

## 2020-06-02 NOTE — Patient Instructions (Addendum)
You will use permethrin to help your rash and you can use hydroxyzine every 8 hours to help with itching .   Please let us know if there is anything else we can do for you  Roney Jaffe, PA-C Physician Assistant [facility] [email or web address]    Scabies, Adult  Scabies is a skin condition that happens when very small insects called mites get under the skin (infestation). This causes a rash and severe itchiness. Scabies is contagious, which means it can spread from person to person. If you get scabies, it is common for others in your household to get scabies too. With proper treatment, symptoms usually go away in 2-4 weeks. Scabies usually does not cause lasting problems. What are the causes? This condition is caused by tiny mites (Sarcoptes scabiei, or human itch mites) that can only be seen with a microscope. The mites get into the top layer of skin and lay eggs. Scabies can spread from person to person through:  Close contact with a person who has scabies.  Sharing or having contact with infested items, such as towels, bedding, or clothing. What increases the risk? The following factors may make you more likely to develop this condition:  Living in a nursing home or other extended care facility.  Having sexual contact with a partner who has scabies.  Caring for others who are at increased risk for scabies. What are the signs or symptoms? Symptoms of this condition include:  Severe itchiness. This is often worse at night.  A rash that includes tiny red bumps or blisters. The rash commonly occurs on the hands, wrists, elbows, armpits, chest, waist, groin, or buttocks. The bumps may form a line (burrow) in some areas.  Skin irritation. This can include scaly patches or sores. How is this diagnosed? This condition may be diagnosed based on:  A physical exam of the skin.  A skin test. Your health care provider may take  a sample of your affected skin (skin scraping) and have it examined under a microscope for signs of mites. How is this treated? This condition may be treated with:  Medicated cream or lotion that kills the mites. This is spread on the entire body and left on for several hours. Usually, one treatment with medicated cream or lotion is enough to kill all the mites. In severe cases, the treatment may need to be repeated.  Medicated cream that relieves itching.  Medicines taken by mouth (orally) that: ? Relieve itching. ? Reduce the swelling and redness. ? Kill the mites. This treatment may be done in severe cases. Follow these instructions at home: Medicines  Take or apply over-the-counter and prescription medicines only as told by your health care provider.  Apply medicated cream or lotion as told by your health care provider.  Do not wash off the medicated cream or lotion until the necessary amount of time has passed. Skin care  Avoid scratching the affected areas of your skin.  Keep your fingernails closely trimmed to reduce injury from scratching.  Take cool baths or apply cool washcloths to your skin to help reduce itching. General instructions  Clean all items that you had contact with during the 3 days before diagnosis. This includes bedding, clothing, towels, and furniture. Do this on the same day that you start treatment. ? Dry-clean items, or use hot water to wash items. Dry items on the hot dry cycle. ? Place items that cannot be washed into closed, airtight plastic bags for at  least 3 days. The mites cannot live for more than 3 days away from human skin. ? Vacuum furniture and mattresses that you use.  Make sure that other people who may have been infested are examined by a health care provider. These include members of your household and anyone who may have had contact with infested items.  Keep all follow-up visits. This is important. Where to find more  information  Centers for Disease Control and Prevention: [employer]Ex[employer].com.br Contact a health care provider if:  You have itching that does not go away after 4 weeks of treatment.  You continue to develop new bumps or burrows.  You have redness, swelling, or pain in your rash area after treatment.  You have fluid, blood, or pus coming from your rash. Summary  Scabies is a skin condition that causes a rash and severe itchiness.  This condition is caused by tiny mites that get into the top layer of the skin and lay eggs.  Scabies can spread from person to person.  Follow treatments as recommended by your health care provider.  Clean all items that you recently had contact with. This information is not intended to replace advice given to you by your health care provider. Make sure you discuss any questions you have with your health care provider. Document Revised: 07/19/2019 Document Reviewed: 07/19/2019 Elsevier Patient Education  2021 [employer].

## 2020-06-02 NOTE — Telephone Encounter (Signed)
Staff message sent to [place] ok to schedule CPAP titration. [facility] [place] # [id number]. Valid dates 06/22/20 to 08/21/20.

## 2020-06-02 NOTE — Telephone Encounter (Signed)
Summary: rash   Patient is experiencing a rash on their hands that has been unable to be cured by OTC ointments/medications   Patient shares that rash is appearing in small patches on their hands, legs and back.   Patient has been experiencing symptoms for almost a week   Please contact to advise      Call to patient- patient states he has developed a rash- unknown cause- all over his body. Patient states the itching is severe and the changes and treatments he has tried are no helping. Call to office= call transferred to office for appointment scheduling. Reason for Disposition . SEVERE itching (i.e., interferes with sleep, normal activities or school)  Answer Assessment - Initial Assessment Questions 1. APPEARANCE of RASH: "Describe the rash." (e.g., spots, blisters, raised areas, skin peeling, scaly)     Tiny bumps- started with legs- patient was treated for cellulitis- left skin rough 2. SIZE: "How big are the spots?" (e.g., tip of pen, eraser, coin; inches, centimeters)     Tiny red bumps 3. LOCATION: "Where is the rash located?"     Chest, legs, back, arm, behind ears 4. COLOR: "What color is the rash?" (Note: It is difficult to assess rash color in people with darker-colored skin. When this situation occurs, simply ask the caller to describe what they see.)     red 5. ONSET: "When did the rash begin?"     At least 1 week 6. FEVER: "Do you have a fever?" If Yes, ask: "What is your temperature, how was it measured, and when did it start?"     no 7. ITCHING: "Does the rash itch?" If Yes, ask: "How bad is the itch?" (Scale 1-10; or mild, moderate, severe)     severe 8. CAUSE: "What do you think is causing the rash?"     unsure 9. MEDICATION FACTORS: "Have you started any new medications within the last 2 weeks?" (e.g., antibiotics)      no 10. OTHER SYMPTOMS: "Do you have any other symptoms?" (e.g., dizziness, headache, sore throat, joint pain)       Foot swelling 11. PREGNANCY:  "Is there any chance you are pregnant?" "When was your last menstrual period?"       n/a  Protocols used: RASH OR REDNESS - [facility]

## 2020-06-02 NOTE — Progress Notes (Signed)
Patient has taken medication today and patient has eaten today. Patient denies pain at this time. Patient reports fine bumps presenting across his chest and left arm. Patient reports right leg cellulitis is not healing as well as the left leg.
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
## 2020-06-05 ENCOUNTER — Other Ambulatory Visit: Payer: Self-pay

## 2020-06-05 ENCOUNTER — Encounter (HOSPITAL_BASED_OUTPATIENT_CLINIC_OR_DEPARTMENT_OTHER): Payer: Medicaid Other | Attending: Physician Assistant | Admitting: Physician Assistant

## 2020-06-05 ENCOUNTER — Telehealth: Payer: Self-pay | Admitting: Licensed Clinical Social Worker

## 2020-06-05 DIAGNOSIS — L97822 Non-pressure chronic ulcer of other part of left lower leg with fat layer exposed: Secondary | ICD-10-CM | POA: Diagnosis not present

## 2020-06-05 DIAGNOSIS — I890 Lymphedema, not elsewhere classified: Secondary | ICD-10-CM | POA: Insufficient documentation

## 2020-06-05 DIAGNOSIS — N183 Chronic kidney disease, stage 3 unspecified: Secondary | ICD-10-CM | POA: Diagnosis not present

## 2020-06-05 DIAGNOSIS — I872 Venous insufficiency (chronic) (peripheral): Secondary | ICD-10-CM | POA: Insufficient documentation

## 2020-06-05 DIAGNOSIS — I5042 Chronic combined systolic (congestive) and diastolic (congestive) heart failure: Secondary | ICD-10-CM | POA: Diagnosis not present

## 2020-06-05 DIAGNOSIS — I130 Hypertensive heart and chronic kidney disease with heart failure and stage 1 through stage 4 chronic kidney disease, or unspecified chronic kidney disease: Secondary | ICD-10-CM | POA: Diagnosis not present

## 2020-06-05 DIAGNOSIS — L97812 Non-pressure chronic ulcer of other part of right lower leg with fat layer exposed: Secondary | ICD-10-CM | POA: Diagnosis not present

## 2020-06-05 NOTE — Telephone Encounter (Signed)
Quick check in call to patient today to check on how things are going. Pt has been approved for Medicaid and applied for disability and spoke with caseworker yesterday 3/3. Unfortunately he is having challenges w/ his legs again and is currently at the wound care center. He also was treated for scabies earlier this week. He states otherwise things are going alright. He is okay with me f/u with him next week. He has my number for any additional questions/concerns at this time.   Octavio Graves, MSW, LCSW [facility]Vascular Care Navigation  [phone]

## 2020-06-05 NOTE — Progress Notes (Addendum)
HAR[place]OR, VANOVER ([id number]) Visit Report for 06/05/2020 Allergy List Details Patient Name: Date of Service: [name], [name] 06/05/2020 12:30 PM Medical Record Number: [id number] Patient Account Number: [id number] Date of Birth/Sex: Treating RN: 23-Feb-1961 (60 y.o. [name], [name] Primary Care [name]: [name] Other Clinician: Referring [name]: Treating [name]/Extender: [name] in Treatment: 0 Allergies Active Allergies latex Allergy Notes Electronic Signature(s) Signed: 06/08/2020 5:34:21 PM By: Fonnie Mu RN Entered By: Fonnie Mu on 06/05/2020 12:51:52 -------------------------------------------------------------------------------- Arrival Information Details Patient Name: Date of Service: [name], [name] 06/05/2020 12:30 PM Medical Record Number: [id number] Patient Account Number: [id number] Date of Birth/Sex: Treating RN: [birth year] (60 y.o. [name] Primary Care Itsel Opfer: [name] Other Clinician: Referring Marsa Matteo: Treating [name]/Extender: [name] in Treatment: 0 Visit Information Patient Arrived: Ambulatory Arrival Time: 12:35 Accompanied By: self Transfer Assistance: None Patient Identification Verified: Yes Secondary Verification Process Completed: Yes History Since Last Visit Added or deleted any medications: No Any new allergies or adverse reactions: No Had a fall or experienced change in activities of daily living that may affect risk of falls: No Signs or symptoms of abuse/neglect since last visito No Hospitalized since last visit: No Implantable device outside of the clinic excluding cellular tissue based products placed in the center since last visit: No Has Dressing in Place as Prescribed: Yes Electronic Signature(s) Signed: 06/05/2020 12:45:34 PM By: [name] Entered By: [name] on 06/05/2020  12:36:48 -------------------------------------------------------------------------------- Compression Therapy Details Patient Name: Date of Service: [name], [name] 06/05/2020 12:30 PM Medical Record Number: [id number] Patient Account Number: [id number] Date of Birth/Sex: Treating RN: 1960/06/20 (60 y.o. [name] Primary Care [name]: [name] Other Clinician: Referring Alandria Butkiewicz: Treating [name]/Extender: [name] in Treatment: 0 Compression Therapy Performed for Wound Assessment: Wound #3 Right,Posterior Lower Leg Performed By: Clinician Shawn Stall, RN Compression Type: Three Layer Post Procedure Diagnosis Same as Pre-procedure Electronic Signature(s) Signed: 06/05/2020 5:05:02 PM By: Shawn Deed RN, BSN Entered By: [name] on 06/05/2020 13:38:50 -------------------------------------------------------------------------------- Compression Therapy Details Patient Name: Date of Service: [name], [name] 06/05/2020 12:30 PM Medical Record Number: [id number] Patient Account Number: [id number] Date of Birth/Sex: Treating RN: Aug 20, 1960 (60 y.o. [name] Primary Care [name]: [name] Other Clinician: Referring [name]: Treating [name]/Extender: [name] in Treatment: 0 Compression Therapy Performed for Wound Assessment: Wound #4 Left,Posterior Lower Leg Performed By: Clinician Shawn Stall, RN Compression Type: Three Layer Post Procedure Diagnosis Same as Pre-procedure Electronic Signature(s) Signed: 06/05/2020 5:05:02 PM By: Shawn Deed RN, BSN Entered By: [name] on 06/05/2020 13:38:50 -------------------------------------------------------------------------------- Encounter Discharge Information Details Patient Name: Date of Service: [name], [name] 06/05/2020 12:30 PM Medical Record Number: [id number] Patient Account Number: [id number] Date of Birth/Sex: Treating  RN: 21-Oct-1960 (60 y.o. [name] Primary Care Lyndal Reggio: [name] Other Clinician: Referring [name]: Treating [name]/Extender: [name] in Treatment: 0 Encounter Discharge Information Items Post Procedure Vitals Discharge Condition: Stable Temperature (F): 98.1 Ambulatory Status: Ambulatory Pulse (bpm): 69 Discharge Destination: Home Respiratory Rate (breaths/min): 18 Transportation: Private Auto Blood Pressure (mmHg): 165/89 Accompanied By: self Schedule Follow-up Appointment: Yes Clinical Summary of Care: Electronic Signature(s) Signed: 06/05/2020 5:05:25 PM By: Shawn Stall Entered By: Shawn Stall on 06/05/2020 14:38:10 -------------------------------------------------------------------------------- Lower Extremity Assessment Details Patient Name: Date of Service: [name], [name] 06/05/2020 12:30 PM Medical Record Number: [id number] Patient Account Number: [id number] Date of Birth/Sex: Treating RN: May 11, 1960 (60 y.o. [name],  [name] Primary Care [name]: [name] Other Clinician: Referring Anai Lipson: Treating [name]/Extender: [name] in Treatment: 0 Edema Assessment Assessed: [name]: Yes] [name]: Yes] Edema: [Left: Yes] [Right: Yes] Calf Left: Right: Point of Measurement: 37 cm From Medial Instep 39 cm 40 cm Ankle Left: Right: Point of Measurement: 8 cm From Medial Instep 26 cm 26 cm Knee To Floor Left: Right: From Medial Instep 44 cm 44 cm Vascular Assessment Pulses: Dorsalis Pedis Palpable: [Left:Yes] [Right:Yes] Electronic Signature(s) Signed: 06/08/2020 5:34:21 PM By: Fonnie Mu RN Entered By: Fonnie Mu on 06/05/2020 13:01:24 -------------------------------------------------------------------------------- Multi-Disciplinary Care Plan Details Patient Name: Date of Service: [name], [name] 06/05/2020 12:30 PM Medical Record Number: [id number] Patient Account  Number: [id number] Date of Birth/Sex: Treating RN: 01-Aug-1960 (59 y.o. [name] Primary Care Oather Muilenburg: [name] Other Clinician: Referring [name]: Treating [name]/Extender: [name] in Treatment: 0 Multidisciplinary Care Plan reviewed with physician Active Inactive Venous Leg Ulcer Nursing Diagnoses: Knowledge deficit related to disease process and management Potential for venous Insuffiency (use before diagnosis confirmed) Goals: Patient will maintain optimal edema control Date Initiated: 06/05/2020 Target Resolution Date: 07/03/2020 Goal Status: Active Interventions: Assess peripheral edema status every visit. Compression as ordered Provide education on venous insufficiency Treatment Activities: Therapeutic compression applied : 06/05/2020 Notes: Wound/Skin Impairment Nursing Diagnoses: Impaired tissue integrity Knowledge deficit related to ulceration/compromised skin integrity Goals: Patient/caregiver will verbalize understanding of skin care regimen Date Initiated: 06/05/2020 Target Resolution Date: 07/03/2020 Goal Status: Active Ulcer/skin breakdown will have a volume reduction of 30% by week 4 Date Initiated: 06/05/2020 Target Resolution Date: 07/03/2020 Goal Status: Active Interventions: Assess patient/caregiver ability to obtain necessary supplies Assess patient/caregiver ability to perform ulcer/skin care regimen upon admission and as needed Assess ulceration(s) every visit Provide education on ulcer and skin care Treatment Activities: Skin care regimen initiated : 06/05/2020 Topical wound management initiated : 06/05/2020 Notes: Electronic Signature(s) Signed: 06/05/2020 5:05:02 PM By: Shawn Deed RN, BSN Entered By: [name] on 06/05/2020 13:24:17 -------------------------------------------------------------------------------- Pain Assessment Details Patient Name: Date of Service: [name], [name] 06/05/2020 12:30  PM Medical Record Number: [id number] Patient Account Number: [id number] Date of Birth/Sex: Treating RN: 08-14-1960 (60 y.o. [name], [name] Primary Care Retaj Hilbun: [name] Other Clinician: Referring Dedra Matsuo: Treating Auguste Tebbetts/Extender: [name] in Treatment: 0 Active Problems Location of Pain Severity and Description of Pain Patient Has Paino Yes Site Locations Site Locations Pain Location: Pain in Ulcers With Dressing Change: Yes Duration of the Pain. Constant / Intermittento Intermittent Rate the pain. Current Pain Level: 5 Worst Pain Level: 10 Least Pain Level: 0 Tolerable Pain Level: 5 Character of Pain Describe the Pain: Aching Pain Management and Medication Current Pain Management: Medication: Yes Cold Application: No Rest: Yes Massage: No Activity: No T.E.N.S.: No Heat Application: No Leg drop or elevation: No Is the Current Pain Management Adequate: Adequate How does your wound impact your activities of daily livingo Sleep: No Bathing: No Appetite: No Relationship With Others: No Bladder Continence: No Emotions: No Bowel Continence: No Work: No Toileting: No Drive: No Dressing: No Hobbies: No Electronic Signature(s) Signed: 06/08/2020 5:34:21 PM By: Fonnie Mu RN Entered By: Fonnie Mu on 06/05/2020 12:57:56 -------------------------------------------------------------------------------- Wound Assessment Details Patient Name: Date of Service: [name], [name] 06/05/2020 12:30 PM Medical Record Number: [id number] Patient Account Number: [id number] Date of Birth/Sex: Treating RN: 09-Aug-1960 (60 y.o. [name], [name] Primary Care Shemiah Rosch: [name] Other Clinician: Referring Elmore Hyslop: Treating Bayley Hurn/Extender: Meribeth Mattes,  Octavio Manns in Treatment: 0 Wound Status Wound Number: 3 Primary Etiology: Lymphedema Wound Location: Right, Posterior Lower Leg Wound Status:  Open Wounding Event: Gradually Appeared Comorbid History: Congestive Heart Failure, Hypertension Date Acquired: 05/18/2020 Weeks Of Treatment: 0 Clustered Wound: No Photos Wound Measurements Length: (cm) 0.4 Width: (cm) 0.4 Depth: (cm) 0.1 Area: (cm) 0.126 Volume: (cm) 0.013 % Reduction in Area: 0% % Reduction in Volume: 0% Epithelialization: None Tunneling: No Undermining: No Wound Description Classification: Full Thickness Without Exposed Support Structu Wound Margin: Distinct, outline attached Exudate Amount: Medium Exudate Type: Serous Exudate Color: amber res Foul Odor After Cleansing: No Slough/Fibrino No Wound Bed Granulation Amount: Large (67-100%) Exposed Structure Granulation Quality: Red, Pink Fascia Exposed: No Necrotic Amount: None Present (0%) Fat Layer (Subcutaneous Tissue) Exposed: Yes Tendon Exposed: No Muscle Exposed: No Joint Exposed: No Bone Exposed: No Treatment Notes Wound #3 (Lower Leg) Wound Laterality: Right, Posterior Cleanser Peri-Wound Care Triamcinolone 15 (g) Discharge Instruction: Use triamcinolone 15 (g) as directed Sween Lotion (Moisturizing lotion) Discharge Instruction: Apply moisturizing lotion as directed Topical Primary Dressing Secondary Dressing Secured With Compression Wrap ThreePress (3 layer compression wrap) Discharge Instruction: Apply three layer compression as directed. Compression Stockings Add-Ons Notes BLE wounds 3 layer compression wraps. Electronic Signature(s) Signed: 06/09/2020 11:11:03 AM By: [name] Signed: 06/10/2020 5:36:09 PM By: Fonnie Mu RN Previous Signature: 06/08/2020 5:34:21 PM Version By: Fonnie Mu RN Entered By: [name] on 06/09/2020 10:28:20 -------------------------------------------------------------------------------- Wound Assessment Details Patient Name: Date of Service: [name], [name] 06/05/2020 12:30 PM Medical Record Number: [id number] Patient Account  Number: [id number] Date of Birth/Sex: Treating RN: 01/09/1961 (59 y.o. [name], [name] Primary Care [place]: [name] Other Clinician: Referring Kai Calico: Treating Carolynn Tuley/Extender: [name] in Treatment: 0 Wound Status Wound Number: 4 Primary Etiology: Lymphedema Wound Location: Left, Posterior Lower Leg Wound Status: Open Wounding Event: Gradually Appeared Comorbid History: Congestive Heart Failure, Hypertension Date Acquired: 05/18/2020 Weeks Of Treatment: 0 Clustered Wound: No Photos Wound Measurements Length: (cm) 0.2 Width: (cm) 0.2 Depth: (cm) 0.1 Area: (cm) 0.031 Volume: (cm) 0.003 % Reduction in Area: 0% % Reduction in Volume: 0% Epithelialization: None Wound Description Classification: Full Thickness Without Exposed Support Structures Wound Margin: Distinct, outline attached Exudate Amount: Medium Exudate Type: Serous Exudate Color: amber Foul Odor After Cleansing: No Slough/Fibrino No Wound Bed Granulation Amount: Large (67-100%) Exposed Structure Granulation Quality: Red, Pink Fascia Exposed: No Fat Layer (Subcutaneous Tissue) Exposed: Yes Tendon Exposed: No Muscle Exposed: No Joint Exposed: No Bone Exposed: No Treatment Notes Wound #4 (Lower Leg) Wound Laterality: Left, Posterior Cleanser Peri-Wound Care Topical Primary Dressing Secondary Dressing Secured With Compression Wrap Compression Stockings Add-Ons Notes BLE wounds 3 layer compression wraps. Electronic Signature(s) Signed: 06/09/2020 11:11:03 AM By: [name] Signed: 06/10/2020 5:36:09 PM By: Fonnie Mu RN Previous Signature: 06/08/2020 5:34:21 PM Version By: Fonnie Mu RN Entered By: [name] on 06/09/2020 10:28:39 -------------------------------------------------------------------------------- Vitals Details Patient Name: Date of Service: [name], [name] 06/05/2020 12:30 PM Medical Record Number: [id number] Patient  Account Number: [id number] Date of Birth/Sex: Treating RN: 1960/12/17 (59 y.o. [name] Primary Care Selene Peltzer: [name] Other Clinician: Referring Odaliz Mcqueary: Treating Ryenn Howeth/Extender: [name] in Treatment: 0 Vital Signs Time Taken: 12:36 Temperature (F): 98.1 Pulse (bpm): 69 Respiratory Rate (breaths/min): 18 Blood Pressure (mmHg): 165/89 Reference Range: 80 - 120 mg / dl Electronic Signature(s) Signed: 06/05/2020 12:45:34 PM By: [name] Entered By: [name] on 06/05/2020 12:37:08

## 2020-06-08 NOTE — Progress Notes (Signed)
KERRIGAN, GLENDENING ([id number]) Visit Report for 06/05/2020 Abuse/Suicide Risk Screen Details Patient Name: Date of Service: [name], [name] 06/05/2020 12:30 PM Medical Record Number: [id number] Patient Account Number: [id number] Date of Birth/Sex: Treating RN: Jul 13, 1960 (60 y.o. [name], [name] Primary Care Bethanny Toelle: [name] Other Clinician: Referring Shakiyah Cirilo: Treating Kolbee Stallman/Extender: [name] in Treatment: 0 Abuse/Suicide Risk Screen Items Answer ABUSE RISK SCREEN: Has anyone close to you tried to hurt or harm you recentlyo No Do you feel uncomfortable with anyone in your familyo No Has anyone forced you do things that you didnt want to doo No Electronic Signature(s) Signed: 06/08/2020 5:34:21 PM By: Fonnie Mu RN Entered By: Fonnie Mu on 06/05/2020 12:54:56 -------------------------------------------------------------------------------- Activities of Daily Living Details Patient Name: Date of Service: [name], [name] 06/05/2020 12:30 PM Medical Record Number: [id number] Patient Account Number: [id number] Date of Birth/Sex: Treating RN: [birth year] (60 y.o. [name], [name] Primary Care Kelcy Laible: [name] Other Clinician: Referring Morgane Joerger: Treating Ka Bench/Extender: [name] in Treatment: 0 Activities of Daily Living Items Answer Activities of Daily Living (Please select one for each item) Drive Automobile Completely Able T Medications ake Completely Able Use T elephone Completely Able Care for Appearance Completely Able Use T oilet Completely Able Bath / Shower Completely Able Dress Self Completely Able Feed Self Completely Able Walk Completely Able Get In / Out Bed Completely Able Housework Completely Able Prepare Meals Completely Able Handle Money Completely Able Shop for Self Completely Able Electronic Signature(s) Signed: 06/08/2020 5:34:21 PM By: Fonnie Mu RN Entered By:  Fonnie Mu on 06/05/2020 12:55:23 -------------------------------------------------------------------------------- Education Screening Details Patient Name: Date of Service: [name], [name] 06/05/2020 12:30 PM Medical Record Number: [id number] Patient Account Number: [id number] Date of Birth/Sex: Treating RN: 05/19/1960 (60 y.o. [name], [name] Primary Care Buena Boehm: [name] Other Clinician: Referring Jafar Poffenberger: Treating Reyna Lorenzi/Extender: [name] in Treatment: 0 Primary Learner Assessed: Patient Learning Preferences/Education Level/Primary Language Learning Preference: Explanation, Demonstration, Communication Board, Printed Material Highest Education Level: College or Above Preferred Language: English Cognitive Barrier Language Barrier: No Translator Needed: No Memory Deficit: No Emotional Barrier: No Cultural/Religious Beliefs Affecting Medical Care: No Physical Barrier Impaired Vision: Yes Glasses Impaired Hearing: No Decreased Hand dexterity: No Knowledge/Comprehension Knowledge Level: High Comprehension Level: High Ability to understand written instructions: High Ability to understand verbal instructions: High Motivation Anxiety Level: Calm Cooperation: Cooperative Education Importance: Denies Need Interest in Health Problems: Asks Questions Perception: Coherent Willingness to Engage in Self-Management High Activities: Readiness to Engage in Self-Management High Activities: Electronic Signature(s) Signed: 06/08/2020 5:34:21 PM By: Fonnie Mu RN Entered By: Fonnie Mu on 06/05/2020 12:55:58 -------------------------------------------------------------------------------- Fall Risk Assessment Details Patient Name: Date of Service: MAVRICK, [name] 06/05/2020 12:30 PM Medical Record Number: [id number] Patient Account Number: [id number] Date of Birth/Sex: Treating RN: Jan 20, 1961 (59 y.o. [name],  [name] Primary Care Jennet Scroggin: [name] Other Clinician: Referring Voris Tigert: Treating Markiesha Delia/Extender: [name] in Treatment: 0 Fall Risk Assessment Items Have you had 2 or more falls in the last 12 monthso 0 No Have you had any fall that resulted in injury in the last 12 monthso 0 No FALLS RISK SCREEN History of falling - immediate or within 3 months 0 No Secondary diagnosis (Do you have 2 or more medical diagnoseso) 0 No Ambulatory aid None/bed rest/wheelchair/nurse 0 No Crutches/cane/walker 0 No Furniture 0 No Intravenous therapy Access/Saline/Heparin Lock 0 No Gait/Transferring Normal/ bed rest/ wheelchair 0 No Weak (short steps with  or without shuffle, stooped but able to lift head while walking, may seek 0 No support from furniture) Impaired (short steps with shuffle, may have difficulty arising from chair, head down, impaired 0 No balance) Mental Status Oriented to own ability 0 No Electronic Signature(s) Signed: 06/08/2020 5:34:21 PM By: Fonnie Mu RN Entered By: Fonnie Mu on 06/05/2020 12:56:05 -------------------------------------------------------------------------------- Foot Assessment Details Patient Name: Date of Service: [name], [name] 06/05/2020 12:30 PM Medical Record Number: [id number] Patient Account Number: [id number] Date of Birth/Sex: Treating RN: 1960-11-12 (60 y.o. [name], [name] Primary Care Zaida Reiland: [name] Other Clinician: Referring Jarquis Walker: Treating Kashton Mcartor/Extender: [name] in Treatment: 0 Foot Assessment Items Site Locations + = Sensation present, - = Sensation absent, C = Callus, U = Ulcer R = Redness, W = Warmth, M = Maceration, PU = Pre-ulcerative lesion F = Fissure, S = Swelling, D = Dryness Assessment Right: Left: Other Deformity: No No Prior Foot Ulcer: No No Prior Amputation: No No Charcot Joint: No No Ambulatory Status: Ambulatory  Without Help Gait: Steady Electronic Signature(s) Signed: 06/08/2020 5:34:21 PM By: Fonnie Mu RN Entered By: Fonnie Mu on 06/05/2020 12:57:04 -------------------------------------------------------------------------------- Nutrition Risk Screening Details Patient Name: Date of Service: [name], [name] 06/05/2020 12:30 PM Medical Record Number: [id number] Patient Account Number: [id number] Date of Birth/Sex: Treating RN: [birth year] (60 y.o. Lucious Groves Primary Care Teneisha Gignac: [name] Other Clinician: Referring Lajuan Kovaleski: Treating Gaby Harney/Extender: [name] in Treatment: 0 Height (in): Weight (lbs): Body Mass Index (BMI): Nutrition Risk Screening Items Score Screening NUTRITION RISK SCREEN: I have an illness or condition that made me change the kind and/or amount of food I eat 0 No I eat fewer than two meals per day 0 No I eat few fruits and vegetables, or milk products 0 No I have three or more drinks of beer, liquor or wine almost every day 0 No I have tooth or mouth problems that make it hard for me to eat 0 No I don't always have enough money to buy the food I need 0 No I eat alone most of the time 0 No I take three or more different prescribed or over-the-counter drugs a day 0 No Without wanting to, I have lost or gained 10 pounds in the last six months 0 No I am not always physically able to shop, cook and/or feed myself 0 No Nutrition Protocols Good Risk Protocol 0 No interventions needed Moderate Risk Protocol High Risk Proctocol Risk Level: Good Risk Score: 0 Electronic Signature(s) Signed: 06/08/2020 5:34:21 PM By: Fonnie Mu RN Entered By: Fonnie Mu on 06/05/2020 12:56:12

## 2020-06-08 NOTE — Progress Notes (Signed)
DAJOHN, ELLENDER ([id number]) Visit Report for 06/05/2020 Chief Complaint Document Details Patient Name: Date of Service: [name], [name] 06/05/2020 12:30 PM Medical Record Number: [id number] Patient Account Number: [id number] Date of Birth/Sex: Treating RN: 1960-05-17 (60 Small.o. [name] Primary Care Provider: [name] Other Clinician: Referring Provider: Treating Provider/Extender: [name] in Treatment: 0 Information Obtained from: Patient Chief Complaint Bilateral Leg Ulcers Electronic Signature(s) Signed: 06/05/2020 1:33:26 PM By: Lenda Kelp PA-C Entered By: Lenda Kelp on 06/05/2020 13:33:26 -------------------------------------------------------------------------------- Debridement Details Patient Name: Date of Service: [name], [name] 06/05/2020 12:30 PM Medical Record Number: [id number] Patient Account Number: [id number] Date of Birth/Sex: Treating RN: 06-29-1960 (81 Small.o. [name] Primary Care Provider: [name] Other Clinician: Referring Provider: Treating Provider/Extender: [name] in Treatment: 0 Debridement Performed for Assessment: Wound #3 Right,Posterior Lower Leg Performed By: Clinician Fonnie Mu, RN Debridement Type: Chemical/Enzymatic/Mechanical Agent Used: saline and gauze Level of Consciousness (Pre-procedure): Awake and Alert Pre-procedure Verification/Time Out No Taken: Bleeding: None Response to Treatment: Procedure was tolerated well Level of Consciousness (Post- Awake and Alert procedure): Post Debridement Measurements of Total Wound Length: (cm) 0.4 Width: (cm) 0.4 Depth: (cm) 0.1 Volume: (cm) 0.013 Character of Wound/Ulcer Post Debridement: Improved Post Procedure Diagnosis Same as Pre-procedure Electronic Signature(s) Signed: 06/05/2020 4:38:50 PM By: Lenda Kelp PA-C Signed: 06/05/2020 5:05:02 PM By: [name] RN, BSN Entered By: [name]  on 06/05/2020 13:39:44 -------------------------------------------------------------------------------- Debridement Details Patient Name: Date of Service: [name], [name] 06/05/2020 12:30 PM Medical Record Number: [id number] Patient Account Number: [id number] Date of Birth/Sex: Treating RN: 01-10-1961 (59 Small.o. [name] Primary Care Provider: [name] Other Clinician: Referring Provider: Treating Provider/Extender: [name] in Treatment: 0 Debridement Performed for Assessment: Wound #4 Left,Posterior Lower Leg Performed By: Clinician Fonnie Mu, RN Debridement Type: Chemical/Enzymatic/Mechanical Agent Used: saline and gauze Level of Consciousness (Pre-procedure): Awake and Alert Pre-procedure Verification/Time Out No Taken: Bleeding: None Response to Treatment: Procedure was tolerated well Level of Consciousness (Post- Awake and Alert procedure): Post Debridement Measurements of Total Wound Length: (cm) 0.2 Width: (cm) 0.2 Depth: (cm) 0.1 Volume: (cm) 0.003 Character of Wound/Ulcer Post Debridement: Improved Post Procedure Diagnosis Same as Pre-procedure Electronic Signature(s) Signed: 06/05/2020 4:38:50 PM By: Lenda Kelp PA-C Signed: 06/05/2020 5:05:02 PM By: [name] RN, BSN Entered By: [name] on 06/05/2020 13:40:10 -------------------------------------------------------------------------------- HPI Details Patient Name: Date of Service: [name], [name] 06/05/2020 12:30 PM Medical Record Number: [id number] Patient Account Number: [id number] Date of Birth/Sex: Treating RN: [birth year] (59 Small.o. [name] Primary Care Provider: [name] Other Clinician: Referring Provider: Treating Provider/Extender: [name] in Treatment: 0 History of Present Illness HPI Description: 02/24/2020 on evaluation today patient presents with obvious chronic lymphedema of the bilateral lower  extremities. Fortunately there is no signs of active infection at this time which is great news. With that being said the patient does have a recent treatment with doxycycline 100 mg for 10 days which he has completed. He does have a history of chronic kidney disease stage III as documented in his chart. He also has chronic venous insufficiency with lower extremity lymphedema noted. He has congestive heart failure and hypertension. He tells me currently that he was unaware of the kidney disease before he mentioned this today. He also tells me that his primary care provider had told him to quit taking showers based on what was going on with  his legs at this time. Fortunately there is no signs of active infection at this time which is great news. No fevers, chills, nausea, vomiting, or diarrhea. 03/04/2020 on evaluation today patient actually appears to be doing much better in regard to his legs. There is really not much draining a lot of the dry skin is starting to loosen up amount of time to get as much of this off today as possible 03/11/2020 upon evaluation today patient appears to be doing well currently in regard to his legs. He still has a lot of dry skin some which I was able to get off today but overall I think that the biggest thing he needs right now is to be able to wash his legs in the shower to loosen some of this up which I think will then come off much more effectively and quickly without as much of an issue. Fortunately there is no evidence of active infection at this time. No fevers, chills, nausea, vomiting, or diarrhea. Readmission: 06/05/2020 upon evaluation today patient appears to be doing worse in regard to his bilateral lower extremities. Unfortunately he has had some issues here with his leg started to swell again he tells me he was not able to wear the compression stockings that he got from elastic therapy. He states when he called the leg he said that the measurements did not  seem right and subsequently just sent him something she thought would work. Either way he tells me they were very tight and very uncomfortable he just was not able to stick with it. Fortunately there is no signs of active infection at this time. No fevers, chills, nausea, vomiting, or diarrhea. Electronic Signature(s) Signed: 06/05/2020 1:43:18 PM By: Lenda Kelp PA-C Entered By: Lenda Kelp on 06/05/2020 13:43:18 -------------------------------------------------------------------------------- Physical Exam Details Patient Name: Date of Service: [name], [name] 06/05/2020 12:30 PM Medical Record Number: [id number] Patient Account Number: [id number] Date of Birth/Sex: Treating RN: [birth year] (39 Small.o. [name] Primary Care Provider: [name] Other Clinician: Referring Provider: Treating Provider/Extender: [name] in Treatment: 0 Constitutional Well-nourished and well-hydrated in no acute distress. Respiratory normal breathing without difficulty. Psychiatric this patient is able to make decisions and demonstrates good insight into disease process. Alert and Oriented x 3. pleasant and cooperative. Notes Upon inspection patient's wound bed actually showed signs of good granulation epithelization at this point. There does not appear to be any evidence of infection which is great news and overall I am extremely pleased with where things stand. No fevers, chills, nausea, vomiting, or diarrhea. Electronic Signature(s) Signed: 06/05/2020 1:44:18 PM By: Lenda Kelp PA-C Entered By: Lenda Kelp on 06/05/2020 13:44:18 -------------------------------------------------------------------------------- Physician Orders Details Patient Name: Date of Service: [name], [name] 06/05/2020 12:30 PM Medical Record Number: [id number] Patient Account Number: [id number] Date of Birth/Sex: Treating RN: 1961-02-05 (30 Small.o. [name] Primary Care Provider:  [name] Other Clinician: Referring Provider: Treating Provider/Extender: [name] in Treatment: 0 Verbal / Phone Orders: No Diagnosis Coding ICD-10 Coding Code Description I89.0 Lymphedema, not elsewhere classified I87.2 Venous insufficiency (chronic) (peripheral) L97.822 Non-pressure chronic ulcer of other part of left lower leg with fat layer exposed L97.812 Non-pressure chronic ulcer of other part of right lower leg with fat layer exposed N18.30 Chronic kidney disease, stage 3 unspecified I50.42 Chronic combined systolic (congestive) and diastolic (congestive) heart failure I10 Essential (primary) hypertension Follow-up Appointments Return Appointment in 1 week. Bathing/ [employer]  May shower with protection but do not get wound dressing(s) wet. - cast protectors to keep dressings dry in the shower Edema Control - Lymphedema / SCD / Other Bilateral Lower Extremities Elevate legs to the level of the heart or above for 30 minutes daily and/or when sitting, a frequency of: Avoid standing for long periods of time. Exercise regularly Wound Treatment Wound #3 - Lower Leg Wound Laterality: Right, Posterior Peri-Wound Care: Triamcinolone 15 (g) 1 x Per Week/30 Days Discharge Instructions: Use triamcinolone 15 (g) as directed Peri-Wound Care: Sween Lotion (Moisturizing lotion) 1 x Per Week/30 Days Discharge Instructions: Apply moisturizing lotion as directed Compression Wrap: ThreePress (3 layer compression wrap) 1 x Per Week/30 Days Discharge Instructions: Apply three layer compression as directed. Electronic Signature(s) Signed: 06/05/2020 4:38:50 PM By: Lenda Kelp PA-C Signed: 06/05/2020 5:05:02 PM By: [name] RN, BSN Entered By: [name] on 06/05/2020 13:44:08 -------------------------------------------------------------------------------- Problem List Details Patient Name: Date of Service: [name], [name] 06/05/2020 12:30  PM Medical Record Number: [id number] Patient Account Number: [id number] Date of Birth/Sex: Treating RN: 1960/09/12 (59 Small.o. 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/07/2020 No Yes L97.812 Non-pressure chronic ulcer of other part of right lower leg with fat layer 06/05/2020 No Yes exposed N18.30 Chronic kidney disease, stage 3 unspecified 06/05/2020 No Yes I50.42 Chronic combined systolic (congestive) and diastolic (congestive) heart failure 06/05/2020 No Yes I10 Essential (primary) hypertension 06/05/2020 No Yes Inactive Problems Resolved Problems Electronic Signature(s) Signed: 06/05/2020 1:33:18 PM By: Lenda Kelp PA-C Entered By: Lenda Kelp on 06/05/2020 13:33:17 -------------------------------------------------------------------------------- Progress Note Details Patient Name: Date of Service: [name], [name] 06/05/2020 12:30 PM Medical Record Number: [id number] Patient Account Number: [id number] Date of Birth/Sex: Treating RN: 1961-02-24 (31 Small.o. [name] Primary Care Provider: [name] Other Clinician: Referring Provider: Treating Provider/Extender: [name] in Treatment: 0 Subjective Chief Complaint Information obtained from Patient Bilateral Leg Ulcers History of Present Illness (HPI) 02/24/2020 on evaluation today patient presents with obvious chronic lymphedema of the bilateral lower extremities. Fortunately there is no signs of active infection at this time which is great news. With that being said the  patient does have a recent treatment with doxycycline 100 mg for 10 days which he has completed. He does have a history of chronic kidney disease stage III as documented in his chart. He also has chronic venous insufficiency with lower extremity lymphedema noted. He has congestive heart failure and hypertension. He tells me currently that he was unaware of the kidney disease before he mentioned this today. He also tells me that his primary care provider had told him to quit taking showers based on what was going on with his legs at this time. Fortunately there is no signs of active infection at this time which is great news. No fevers, chills, nausea, vomiting, or diarrhea. 03/04/2020 on evaluation today patient actually appears to be doing much better in regard to his legs. There is really not much draining a lot of the dry skin is starting to loosen up amount of time to get as much of this off today as possible 03/11/2020 upon evaluation today patient appears  to be doing well currently in regard to his legs. He still has a lot of dry skin some which I was able to get off today but overall I think that the biggest thing he needs right now is to be able to wash his legs in the shower to loosen some of this up which I think will then come off much more effectively and quickly without as much of an issue. Fortunately there is no evidence of active infection at this time. No fevers, chills, nausea, vomiting, or diarrhea. Readmission: 06/05/2020 upon evaluation today patient appears to be doing worse in regard to his bilateral lower extremities. Unfortunately he has had some issues here with his leg started to swell again he tells me he was not able to wear the compression stockings that he got from elastic therapy. He states when he called the leg he said that the measurements did not seem right and subsequently just sent him something she thought would work. Either way he tells me they were very tight and  very uncomfortable he just was not able to stick with it. Fortunately there is no signs of active infection at this time. No fevers, chills, nausea, vomiting, or diarrhea. Patient History Information obtained from Patient. Allergies latex Family History Heart Disease - Mother, Hypertension - Mother, Kidney Disease - Mother, No family history of Cancer, Diabetes, Hereditary Spherocytosis, Lung Disease, Seizures, Stroke, Thyroid Problems, Tuberculosis. Social History Never smoker, Marital Status - Married, Alcohol Use - Never, Drug Use - No History, Caffeine Use - Never. Medical History Eyes Denies history of Cataracts, Glaucoma, Optic Neuritis Ear/Nose/Mouth/Throat Denies history of Chronic sinus problems/congestion, Middle ear problems Hematologic/Lymphatic Denies history of Anemia, Hemophilia, Human Immunodeficiency Virus, Lymphedema, Sickle Cell Disease Respiratory Denies history of Aspiration, Asthma, Chronic Obstructive Pulmonary Disease (COPD), Pneumothorax, Sleep Apnea, Tuberculosis Cardiovascular Patient has history of Congestive Heart Failure, Hypertension Denies history of Angina, Arrhythmia, Coronary Artery Disease, Deep Vein Thrombosis, Hypotension, Myocardial Infarction, Peripheral Arterial Disease, Peripheral Venous Disease, Phlebitis, Vasculitis Gastrointestinal Denies history of Cirrhosis , Colitis, Crohnoos, Hepatitis A, Hepatitis B, Hepatitis C Endocrine Denies history of Type I Diabetes, Type II Diabetes Genitourinary Denies history of End Stage Renal Disease Immunological Denies history of Lupus Erythematosus, Raynaudoos, Scleroderma Integumentary (Skin) Denies history of History of Burn Musculoskeletal Denies history of Gout, Rheumatoid Arthritis, Osteoarthritis, Osteomyelitis Neurologic Denies history of Dementia, Neuropathy, Quadriplegia, Paraplegia, Seizure Disorder Oncologic Denies history of Received Chemotherapy, Received  Radiation Psychiatric Denies history of Anorexia/bulimia, Confinement Anxiety Review of Systems (ROS) Constitutional Symptoms (General Health) Denies complaints or symptoms of Fatigue, Fever, Chills, Marked Weight Change. Eyes Denies complaints or symptoms of Dry Eyes, Vision Changes, Glasses / Contacts. Ear/Nose/Mouth/Throat Denies complaints or symptoms of Chronic sinus problems or rhinitis. Respiratory Denies complaints or symptoms of Chronic or frequent coughs, Shortness of Breath. Cardiovascular Denies complaints or symptoms of Chest pain. Gastrointestinal Denies complaints or symptoms of Frequent diarrhea, Nausea, Vomiting. Endocrine Denies complaints or symptoms of Heat/cold intolerance. Genitourinary Denies complaints or symptoms of Frequent urination. Integumentary (Skin) Denies complaints or symptoms of Wounds. Musculoskeletal Denies complaints or symptoms of Muscle Pain, Muscle Weakness. Neurologic Denies complaints or symptoms of Numbness/parasthesias. Psychiatric Denies complaints or symptoms of Claustrophobia, Suicidal. Objective Constitutional Well-nourished and well-hydrated in no acute distress. Vitals Time Taken: 12:36 PM, Temperature: 98.1 F, Pulse: 69 bpm, Respiratory Rate: 18 breaths/min, Blood Pressure: 165/89 mmHg. Respiratory normal breathing without difficulty. Psychiatric this patient is able to make decisions and demonstrates good insight into disease process. Alert and  Oriented x 3. pleasant and cooperative. General Notes: Upon inspection patient's wound bed actually showed signs of good granulation epithelization at this point. There does not appear to be any evidence of infection which is great news and overall I am extremely pleased with where things stand. No fevers, chills, nausea, vomiting, or diarrhea. Integumentary (Hair, Skin) Wound #3 status is Open. Original cause of wound was Gradually Appeared. The date acquired was: 05/18/2020. The  wound is located on the Right,Posterior Lower Leg. The wound measures 0.4cm length x 0.4cm width x 0.1cm depth; 0.126cm^2 area and 0.013cm^3 volume. There is Fat Layer (Subcutaneous Tissue) exposed. There is no tunneling or undermining noted. There is a medium amount of serous drainage noted. The wound margin is distinct with the outline attached to the wound base. There is large (67-100%) red, pink granulation within the wound bed. There is no necrotic tissue within the wound bed. Wound #4 status is Open. Original cause of wound was Gradually Appeared. The date acquired was: 05/18/2020. The wound is located on the Left,Posterior Lower Leg. The wound measures 0.2cm length x 0.2cm width x 0.1cm depth; 0.031cm^2 area and 0.003cm^3 volume. There is Fat Layer (Subcutaneous Tissue) exposed. There is a medium amount of serous drainage noted. The wound margin is distinct with the outline attached to the wound base. There is large (67- 100%) red, pink granulation within the wound bed. Assessment Active Problems ICD-10 Lymphedema, not elsewhere classified Venous insufficiency (chronic) (peripheral) Non-pressure chronic ulcer of other part of left lower leg with fat layer exposed Non-pressure chronic ulcer of other part of right lower leg with fat layer exposed Chronic kidney disease, stage 3 unspecified Chronic combined systolic (congestive) and diastolic (congestive) heart failure Essential (primary) hypertension Procedures Wound #3 Pre-procedure diagnosis of Wound #3 is a Lymphedema located on the Right,Posterior Lower Leg . There was a Chemical/Enzymatic/Mechanical debridement performed by Fonnie Mu, RN.. Other agent used was saline and gauze. There was no bleeding. The procedure was tolerated well. Post Debridement Measurements: 0.4cm length x 0.4cm width x 0.1cm depth; 0.013cm^3 volume. Character of Wound/Ulcer Post Debridement is improved. Post procedure Diagnosis Wound #3: Same as  Pre-Procedure Pre-procedure diagnosis of Wound #3 is a Lymphedema located on the Right,Posterior Lower Leg . There was a Three Layer Compression Therapy Procedure by Shawn Stall, RN. Post procedure Diagnosis Wound #3: Same as Pre-Procedure Wound #4 Pre-procedure diagnosis of Wound #4 is a Lymphedema located on the Left,Posterior Lower Leg . There was a Chemical/Enzymatic/Mechanical debridement performed by Fonnie Mu, RN.. Other agent used was saline and gauze. There was no bleeding. The procedure was tolerated well. Post Debridement Measurements: 0.2cm length x 0.2cm width x 0.1cm depth; 0.003cm^3 volume. Character of Wound/Ulcer Post Debridement is improved. Post procedure Diagnosis Wound #4: Same as Pre-Procedure Pre-procedure diagnosis of Wound #4 is a Lymphedema located on the Left,Posterior Lower Leg . There was a Three Layer Compression Therapy Procedure by Shawn Stall, RN. Post procedure Diagnosis Wound #4: Same as Pre-Procedure Plan Follow-up Appointments: Return Appointment in 1 week. Bathing/ Shower/ Hygiene: May shower with protection but do not get wound dressing(s) wet. - cast protectors to keep dressings dry in the shower Edema Control - Lymphedema / SCD / Other: Elevate legs to the level of the heart or above for 30 minutes daily and/or when sitting, a frequency of: Avoid standing for long periods of time. Exercise regularly WOUND #3: - Lower Leg Wound Laterality: Right, Posterior Peri-Wound Care: Triamcinolone 15 (g) 1 x Per Week/30 Days Discharge  Instructions: Use triamcinolone 15 (g) as directed Peri-Wound Care: Sween Lotion (Moisturizing lotion) 1 x Per Week/30 Days Discharge Instructions: Apply moisturizing lotion as directed Com pression Wrap: ThreePress (3 layer compression wrap) 1 x Per Week/30 Days Discharge Instructions: Apply three layer compression as directed. 1. I would recommend currently that we actually go ahead and have the patient wrapped  today using triamcinolone and moisturizing lotion of her bilateral lower extremities. Subsequently we will also initiate a 3 layer compression wrap. 2. Also can see about ordering a Velcro compression wraps I think this can be beneficial for him. 3. I am also can recommend the patient should try to elevate his legs as much as possible. 4. I did explain that if he does not wear compression of some sort he is getting continue to have significant issues when it comes to issues with swelling and lower extremity edema as well as wounds. Obviously if he does not want this to happen he definitely needs to work to ensure he has compression on every day. We will see patient back for reevaluation in 1 week here in the clinic. If anything worsens or changes patient will contact our office for additional recommendations. Electronic Signature(s) Signed: 06/05/2020 1:45:41 PM By: Lenda Kelp PA-C Entered By: Lenda Kelp on 06/05/2020 13:45:41 -------------------------------------------------------------------------------- HxROS Details Patient Name: Date of Service: [name], [name] 06/05/2020 12:30 PM Medical Record Number: [id number] Patient Account Number: [id number] Date of Birth/Sex: Treating RN: [birth year] (32 Small.o. [name], [name] Primary Care Provider: [name] Other Clinician: Referring Provider: Treating Provider/Extender: [name] in Treatment: 0 Information Obtained From Patient Constitutional Symptoms (General Health) Complaints and Symptoms: Negative for: Fatigue; Fever; Chills; Marked Weight Change Eyes Complaints and Symptoms: Negative for: Dry Eyes; Vision Changes; Glasses / Contacts Medical History: Negative for: Cataracts; Glaucoma; Optic Neuritis Ear/Nose/Mouth/Throat Complaints and Symptoms: Negative for: Chronic sinus problems or rhinitis Medical History: Negative for: Chronic sinus problems/congestion; Middle ear  problems Respiratory Complaints and Symptoms: Negative for: Chronic or frequent coughs; Shortness of Breath Medical History: Negative for: Aspiration; Asthma; Chronic Obstructive Pulmonary Disease (COPD); Pneumothorax; Sleep Apnea; Tuberculosis Cardiovascular Complaints and Symptoms: Negative for: Chest pain Medical History: Positive for: Congestive Heart Failure; Hypertension Negative for: Angina; Arrhythmia; Coronary Artery Disease; Deep Vein Thrombosis; Hypotension; Myocardial Infarction; Peripheral Arterial Disease; Peripheral Venous Disease; Phlebitis; Vasculitis Gastrointestinal Complaints and Symptoms: Negative for: Frequent diarrhea; Nausea; Vomiting Medical History: Negative for: Cirrhosis ; Colitis; Crohns; Hepatitis A; Hepatitis B; Hepatitis C Endocrine Complaints and Symptoms: Negative for: Heat/cold intolerance Medical History: Negative for: Type I Diabetes; Type II Diabetes Genitourinary Complaints and Symptoms: Negative for: Frequent urination Medical History: Negative for: End Stage Renal Disease Integumentary (Skin) Complaints and Symptoms: Negative for: Wounds Medical History: Negative for: History of Burn Musculoskeletal Complaints and Symptoms: Negative for: Muscle Pain; Muscle Weakness Medical History: Negative for: Gout; Rheumatoid Arthritis; Osteoarthritis; Osteomyelitis Neurologic Complaints and Symptoms: Negative for: Numbness/parasthesias Medical History: Negative for: Dementia; Neuropathy; Quadriplegia; Paraplegia; Seizure Disorder Psychiatric Complaints and Symptoms: Negative for: Claustrophobia; Suicidal Medical History: Negative for: Anorexia/bulimia; Confinement Anxiety Hematologic/Lymphatic Medical History: Negative for: Anemia; Hemophilia; Human Immunodeficiency Virus; Lymphedema; Sickle Cell Disease Immunological Medical History: Negative for: Lupus Erythematosus; Raynauds; Scleroderma Oncologic Medical History: Negative for:  Received Chemotherapy; Received Radiation Immunizations Pneumococcal Vaccine: Received Pneumococcal Vaccination: No Implantable Devices None Family and Social History Cancer: No; Diabetes: No; Heart Disease: Yes - Mother; Hereditary Spherocytosis: No; Hypertension: Yes - Mother; Kidney Disease: Yes - Mother; Lung Disease: No; Seizures:  No; Stroke: No; Thyroid Problems: No; Tuberculosis: No; Never smoker; Marital Status - Married; Alcohol Use: Never; Drug Use: No History; Caffeine Use: Never; Financial Concerns: No; Food, Clothing or Shelter Needs: No; Support System Lacking: No; Transportation Concerns: No Electronic Signature(s) Signed: 06/05/2020 4:38:50 PM By: Lenda KelpStone III, Lujean Ebright PA-C Signed: 06/08/2020 5:34:21 PM By: Fonnie MuBreedlove, [name] RN Entered By: Fonnie MuBreedlove, [name] on 06/05/2020 12:54:16 -------------------------------------------------------------------------------- SuperBill Details Patient Name: Date of Service: [name], [name] 06/05/2020 Medical Record Number: [id number] Patient Account Number: [id number] Date of Birth/Sex: Treating RN: [birth year] (59 Small.o. Damaris SchoonerM) Boehlein, Linda Primary Care Provider: Shan LevansWright, Patrick Other Clinician: Referring Provider: Treating Provider/Extender: Zara ChessStone III, Camani Sesay Wright, Patrick Weeks in Treatment: 0 Diagnosis Coding ICD-10 Codes Code Description I89.0 Lymphedema, not elsewhere classified I87.2 Venous insufficiency (chronic) (peripheral) L97.822 Non-pressure chronic ulcer of other part of left lower leg with fat layer exposed L97.812 Non-pressure chronic ulcer of other part of right lower leg with fat layer exposed N18.30 Chronic kidney disease, stage 3 unspecified I50.42 Chronic combined systolic (congestive) and diastolic (congestive) heart failure I10 Essential (primary) hypertension Facility Procedures CPT4: Code [id number] Description: 13 - WOUND CARE VISIT-LEV 3 EST PT Modifier: 25 Quantity: 1 CPT4: [id number] foo Description: 81  BILATERAL: Application of multi-layer venous compression system; leg (below knee), including ankle and t. Modifier: Quantity: 1 Physician Procedures : CPT4 Code Description Modifier [id number] - WC PHYS LEVEL 3 - EST PT ICD-10 Diagnosis Description I89.0 Lymphedema, not elsewhere classified I87.2 Venous insufficiency (chronic) (peripheral) L97.822 Non-pressure chronic ulcer of other part of left  lower leg with fat layer exposed L97.812 Non-pressure chronic ulcer of other part of right lower leg with fat layer exposed Quantity: 1 Electronic Signature(s) Signed: 06/05/2020 1:46:36 PM By: Lenda KelpStone III, Natalia Wittmeyer PA-C Entered By: Lenda KelpStone III, Lundynn Cohoon on 06/05/2020 13:46:36

## 2020-06-09 ENCOUNTER — Ambulatory Visit: Payer: Self-pay | Admitting: Critical Care Medicine
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
## 2020-06-10 NOTE — Addendum Note (Signed)
Addended by: Reesa Chew on: 06/10/2020 01:07 PM   Modules accepted: Orders

## 2020-06-12 ENCOUNTER — Encounter (HOSPITAL_BASED_OUTPATIENT_CLINIC_OR_DEPARTMENT_OTHER): Payer: Medicaid Other | Admitting: Physician Assistant

## 2020-06-12 ENCOUNTER — Other Ambulatory Visit: Payer: Self-pay

## 2020-06-12 DIAGNOSIS — I890 Lymphedema, not elsewhere classified: Secondary | ICD-10-CM | POA: Diagnosis not present

## 2020-06-12 NOTE — Progress Notes (Addendum)
[name] ([id number]) Visit Report for 06/12/2020 Chief Complaint Document Details Patient Name: Date of Service: [name], [name] 06/12/2020 1:15 PM Medical Record Number: [id number] Patient Account Number: [id number] Date of Birth/Sex: Treating RN: 21-Jan-1961 (60 y.o. [name] Primary Care Provider: [name] Other Clinician: Referring Provider: Treating Provider/Extender: [name] in Treatment: 1 Information Obtained from: Patient Chief Complaint Bilateral Leg Ulcers Electronic Signature(s) Signed: 06/12/2020 1:07:41 PM By: Lenda Kelp PA-C Entered By: Lenda Kelp on 06/12/2020 13:07:41 -------------------------------------------------------------------------------- HPI Details Patient Name: Date of Service: [name] 06/12/2020 1:15 PM Medical Record Number: [id number] Patient Account Number: [id number] Date of Birth/Sex: Treating RN: 1961/02/04 (60 y.o. [name] Primary Care Provider: [name] Other Clinician: Referring Provider: Treating Provider/Extender: [name] in Treatment: 1 History of Present Illness HPI Description: 02/24/2020 on evaluation today patient presents with obvious chronic lymphedema of the bilateral lower extremities. Fortunately there is no signs of active infection at this time which is great news. With that being said the patient does have a recent treatment with doxycycline 100 mg for 10 days which he has completed. He does have a history of chronic kidney disease stage III as documented in his chart. He also has chronic venous insufficiency with lower extremity lymphedema noted. He has congestive heart failure and hypertension. He tells me currently that he was unaware of the kidney disease before he mentioned this today. He also tells me that his primary care provider had told him to quit taking showers based on what was going on with his legs at this time.  Fortunately there is no signs of active infection at this time which is great news. No fevers, chills, nausea, vomiting, or diarrhea. 03/04/2020 on evaluation today patient actually appears to be doing much better in regard to his legs. There is really not much draining a lot of the dry skin is starting to loosen up amount of time to get as much of this off today as possible 03/11/2020 upon evaluation today patient appears to be doing well currently in regard to his legs. He still has a lot of dry skin some which I was able to get off today but overall I think that the biggest thing he needs right now is to be able to wash his legs in the shower to loosen some of this up which I think will then come off much more effectively and quickly without as much of an issue. Fortunately there is no evidence of active infection at this time. No fevers, chills, nausea, vomiting, or diarrhea. Readmission: 06/05/2020 upon evaluation today patient appears to be doing worse in regard to his bilateral lower extremities. Unfortunately he has had some issues here with his leg started to swell again he tells me he was not able to wear the compression stockings that he got from elastic therapy. He states when he called the leg he said that the measurements did not seem right and subsequently just sent him something she thought would work. Either way he tells me they were very tight and very uncomfortable he just was not able to stick with it. Fortunately there is no signs of active infection at this time. No fevers, chills, nausea, vomiting, or diarrhea. 06/12/2020 on evaluation today patient actually appears to be doing well in regard to his legs. He has a lot of dry skin but nothing that appears to be open at this point. He did not get the  Velcro compression wraps but does have the pull up compression stockings which if he wears those can definitely be sufficient here. Electronic Signature(s) Signed: 06/12/2020 2:19:41 PM  By: Lenda Kelp PA-C Signed: 06/12/2020 2:19:41 PM By: Lenda Kelp PA-C Entered By: Lenda Kelp on 06/12/2020 14:19:41 -------------------------------------------------------------------------------- Physical Exam Details Patient Name: Date of Service: [name] 06/12/2020 1:15 PM Medical Record Number: [id number] Patient Account Number: [id number] Date of Birth/Sex: Treating RN: [birth year] (60 y.o. [name] Primary Care Provider: [name] Other Clinician: Referring Provider: Treating Provider/Extender: [name] in Treatment: 1 Constitutional Well-nourished and well-hydrated in no acute distress. Respiratory normal breathing without difficulty. Psychiatric this patient is able to make decisions and demonstrates good insight into disease process. Alert and Oriented x 3. pleasant and cooperative. Notes Patient's wound bed actually showed signs of everything being completely epithelialized I do not see any signs of anything open currently which is great news and overall I am extremely pleased in that regard. With that being said I do believe that the patient is making great progress as far as keeping edema under control with the wraps now the question is if he can keep it that way with the compression socks. Electronic Signature(s) Signed: 06/12/2020 2:20:05 PM By: Lenda Kelp PA-C Entered By: Lenda Kelp on 06/12/2020 14:20:05 -------------------------------------------------------------------------------- Physician Orders Details Patient Name: Date of Service: [name] 06/12/2020 1:15 PM Medical Record Number: [id number] Patient Account Number: [id number] Date of Birth/Sex: Treating RN: 1960-07-03 (60 y.o. [name] Primary Care Provider: [name] Other Clinician: Referring Provider: Treating Provider/Extender: [name] in Treatment: 1 Verbal / Phone Orders:  No Diagnosis Coding ICD-10 Coding Code Description I89.0 Lymphedema, not elsewhere classified I87.2 Venous insufficiency (chronic) (peripheral) L97.822 Non-pressure chronic ulcer of other part of left lower leg with fat layer exposed L97.812 Non-pressure chronic ulcer of other part of right lower leg with fat layer exposed N18.30 Chronic kidney disease, stage 3 unspecified I50.42 Chronic combined systolic (congestive) and diastolic (congestive) heart failure I10 Essential (primary) hypertension Discharge From [facility] Services Discharge from Wound Care Center Bathing/ Shower/ Hygiene May shower and wash wound with soap and water. Edema Control - Lymphedema / SCD / Other Bilateral Lower Extremities Elevate legs to the level of the heart or above for 30 minutes daily and/or when sitting, a frequency of: Avoid standing for long periods of time. Exercise regularly Moisturize legs daily. - both legs Compression stocking or Garment 20-30 mm/Hg pressure to: - either compression stockings or juxtalite compression garments daily. apply first thing in the morning and remove at night Electronic Signature(s) Signed: 06/12/2020 6:00:02 PM By: [name] RN, BSN Signed: 06/12/2020 7:21:59 PM By: Lenda Kelp PA-C Entered By: [name] on 06/12/2020 14:16:38 -------------------------------------------------------------------------------- Problem List Details Patient Name: Date of Service: AERON, DONAGHEY 06/12/2020 1:15 PM Medical Record Number: [id number] Patient Account Number: [id number] Date of Birth/Sex: Treating RN: 07/15/1960 (59 y.o. 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/07/2020 No Yes L97.812 Non-pressure chronic ulcer of other part of right lower leg with fat layer 06/05/2020 No Yes exposed N18.30 Chronic kidney  disease, stage 3 unspecified 06/05/2020 No Yes I50.42 Chronic combined systolic (congestive) and diastolic (congestive) heart failure 06/05/2020 No Yes I10 Essential (primary) hypertension 06/05/2020 No Yes Inactive Problems Resolved Problems Electronic Signature(s) Signed: 06/12/2020 1:07:36 PM By: Lenda KelpStone III, Avrohom Mckelvin PA-C Entered By: Lenda KelpStone III, Taydon Nasworthy on 06/12/2020 13:07:36 -------------------------------------------------------------------------------- Progress Note Details Patient Name: Date of Service: Bobbe MedicoRA Y, Pius 06/12/2020 1:15 PM Medical Record Number: [id number] Patient Account Number: [id number] Date of Birth/Sex: Treating RN: [birth year] (60 y.o. Damaris SchoonerM) Boehlein, Linda Primary Care Provider: Shan LevansWright, Patrick Other Clinician: Referring Provider: Treating Provider/Extender: Zara ChessStone III, Helane Briceno Wright, Patrick Weeks in Treatment: 1 Subjective Chief Complaint Information obtained from Patient Bilateral Leg Ulcers History of Present Illness (HPI) 02/24/2020 on evaluation today patient presents with obvious chronic lymphedema of the bilateral lower extremities. Fortunately there is no signs of active infection at this time which is great news. With that being said the patient does have a recent treatment with doxycycline 100 mg for 10 days which he has completed. He does have a history of chronic kidney disease stage III as documented in his chart. He also has chronic venous insufficiency with lower extremity lymphedema noted. He has congestive heart failure and hypertension. He tells me currently that he was unaware of the kidney disease before he mentioned this today. He also tells me that his primary care provider had told him to  quit taking showers based on what was going on with his legs at this time. Fortunately there is no signs of active infection at this time which is great news. No fevers, chills, nausea, vomiting, or diarrhea. 03/04/2020 on evaluation today patient actually appears to be doing much better in regard to his legs. There is really not much draining a lot of the dry skin is starting to loosen up amount of time to get as much of this off today as possible 03/11/2020 upon evaluation today patient appears to be doing well currently in regard to his legs. He still has a lot of dry skin some which I was able to get off today but overall I think that the biggest thing he needs right now is to be able to wash his legs in the shower to loosen some of this up which I think will then come off much more effectively and quickly without as much of an issue. Fortunately there is no evidence of active infection at this time. No fevers, chills, nausea, vomiting, or diarrhea. Readmission: 06/05/2020 upon evaluation today patient appears to be doing worse in regard to his bilateral lower extremities. Unfortunately he has had some issues here with his leg started to swell again he tells me he was not able to wear the compression stockings that he got from elastic therapy. He states when he called the leg he said that the measurements did not seem right and subsequently just sent him something she thought would work. Either way he tells me they were very tight and very uncomfortable he just was not able to stick with it. Fortunately there is no signs of active infection at this time. No fevers, chills, nausea, vomiting, or diarrhea. 06/12/2020 on evaluation today patient actually appears to be doing well in regard to his legs. He has a lot of dry skin but nothing that appears to be open at this point. He did not get the Velcro compression wraps but does have the pull up compression stockings which if he wears those can definitely  be sufficient here. Objective Constitutional Well-nourished and well-hydrated in no  acute distress. Vitals Time Taken: 1:35 PM, Temperature: 98.3 F, Pulse: 65 bpm, Respiratory Rate: 18 breaths/min, Blood Pressure: 145/89 mmHg. Respiratory normal breathing without difficulty. Psychiatric this patient is able to make decisions and demonstrates good insight into disease process. Alert and Oriented x 3. pleasant and cooperative. General Notes: Patient's wound bed actually showed signs of everything being completely epithelialized I do not see any signs of anything open currently which is great news and overall I am extremely pleased in that regard. With that being said I do believe that the patient is making great progress as far as keeping edema under control with the wraps now the question is if he can keep it that way with the compression socks. Integumentary (Hair, Skin) Wound #3 status is Open. Original cause of wound was Gradually Appeared. The date acquired was: 05/18/2020. The wound has been in treatment 1 weeks. The wound is located on the Right,Posterior Lower Leg. The wound measures 0cm length x 0cm width x 0cm depth; 0cm^2 area and 0cm^3 volume. Wound #4 status is Open. Original cause of wound was Gradually Appeared. The date acquired was: 05/18/2020. The wound has been in treatment 1 weeks. The wound is located on the Left,Posterior Lower Leg. The wound measures 0cm length x 0cm width x 0cm depth; 0cm^2 area and 0cm^3 volume. Assessment Active Problems ICD-10 Lymphedema, not elsewhere classified Venous insufficiency (chronic) (peripheral) Non-pressure chronic ulcer of other part of left lower leg with fat layer exposed Non-pressure chronic ulcer of other part of right lower leg with fat layer exposed Chronic kidney disease, stage 3 unspecified Chronic combined systolic (congestive) and diastolic (congestive) heart failure Essential (primary) hypertension Plan Discharge From [facility]  Services: Discharge from Wound Care Center Bathing/ Shower/ Hygiene: May shower and wash wound with soap and water. Edema Control - Lymphedema / SCD / Other: Elevate legs to the level of the heart or above for 30 minutes daily and/or when sitting, a frequency of: Avoid standing for long periods of time. Exercise regularly Moisturize legs daily. - both legs Compression stocking or Garment 20-30 mm/Hg pressure to: - either compression stockings or juxtalite compression garments daily. apply first thing in the morning and remove at night 1. I would recommend that we go ahead and continue with the recommendation for compression he did not get the Velcro compression wraps therefore he will be using his compression stockings which is really fine. I told him that he does need to have these on every day however and that needs to be routine for him. If not his legs will swell and he will wind up with things reopening again which I know is not what he wants to see happen. 2. For now I am also can recommend that he is to wash his legs each day can get some of the dry skin all future issues his hands no washcloths or otherwise to scrub. Following this he should at nighttime put lotion on during the day no lotion should be used that does not damage his stockings. We will see him back for a follow-up visit as needed. Electronic Signature(s) Signed: 06/12/2020 2:20:54 PM By: Lenda Kelp PA-C Entered By: Lenda Kelp on 06/12/2020 14:20:54 -------------------------------------------------------------------------------- SuperBill Details Patient Name: Date of Service: PRAYAN, ULIN 06/12/2020 Medical Record Number: [id number] Patient Account Number: [id number] Date of Birth/Sex: Treating RN: [birth year] (60 y.o. [name] Primary Care Provider: [name] Other Clinician: Referring Provider: Treating Provider/Extender: [name] in Treatment: 1  Diagnosis  Coding ICD-10 Codes Code Description I89.0 Lymphedema, not elsewhere classified I87.2 Venous insufficiency (chronic) (peripheral) L97.822 Non-pressure chronic ulcer of other part of left lower leg with fat layer exposed L97.812 Non-pressure chronic ulcer of other part of right lower leg with fat layer exposed N18.30 Chronic kidney disease, stage 3 unspecified I50.42 Chronic combined systolic (congestive) and diastolic (congestive) heart failure I10 Essential (primary) hypertension Facility Procedures CPT4 Code: [id number] Description: 99213 - WOUND CARE VISIT-LEV 3 EST PT Modifier: Quantity: 1 Physician Procedures : CPT4 Code Description Modifier [id number] - WC PHYS LEVEL 3 - EST PT ICD-10 Diagnosis Description I89.0 Lymphedema, not elsewhere classified I87.2 Venous insufficiency (chronic) (peripheral) L97.822 Non-pressure chronic ulcer of other part of left  lower leg with fat layer exposed L97.812 Non-pressure chronic ulcer of other part of right lower leg with fat layer exposed Quantity: 1 Electronic Signature(s) Signed: 06/12/2020 2:22:11 PM By: Lenda Kelp PA-C Entered By: Lenda Kelp on 06/12/2020 14:22:11

## 2020-06-15 NOTE — Progress Notes (Signed)
ZAHMIR, LALLA ([id number]) Visit Report for 06/12/2020 Arrival Information Details Patient Name: Date of Service: [name], [name] 06/12/2020 1:15 PM Medical Record Number: [id number] Patient Account Number: [id number] Date of Birth/Sex: Treating RN: [birth year] (60 y.o. [name] Primary Care Shina Wass: [name] Other Clinician: Referring Damico Partin: Treating Somtochukwu Woollard/Extender: [name] in Treatment: 1 Visit Information History Since Last Visit Added or deleted any medications: No Patient Arrived: Ambulatory Any new allergies or adverse reactions: No Arrival Time: 13:34 Had a fall or experienced change in No Accompanied By: self activities of daily living that may affect Transfer Assistance: None risk of falls: Patient Identification Verified: Yes Signs or symptoms of abuse/neglect since last visito No Secondary Verification Process Completed: Yes Hospitalized since last visit: No Implantable device outside of the clinic excluding No cellular tissue based products placed in the center since last visit: Has Dressing in Place as Prescribed: Yes Pain Present Now: No Electronic Signature(s) Signed: 06/15/2020 7:46:34 AM By: [name] Entered By: [name] on 06/12/2020 13:35:04 -------------------------------------------------------------------------------- Clinic Level of Care Assessment Details Patient Name: Date of Service: [name], [name] 06/12/2020 1:15 PM Medical Record Number: [id number] Patient Account Number: [id number] Date of Birth/Sex: Treating RN: Aug 29, 1960 (60 y.o. [name] Primary Care Novaleigh Kohlman: [name] Other Clinician: Referring Drayke Grabel: Treating Reizel Calzada/Extender: [name] in Treatment: 1 Clinic Level of Care Assessment Items TOOL 4 Quantity Score []  - 0 Use when only an EandM is performed on FOLLOW-UP visit ASSESSMENTS - Nursing Assessment / Reassessment X- 1  10 Reassessment of Co-morbidities (includes updates in patient status) X- 1 5 Reassessment of Adherence to Treatment Plan ASSESSMENTS - Wound and Skin A ssessment / Reassessment []  - 0 Simple Wound Assessment / Reassessment - one wound X- 2 5 Complex Wound Assessment / Reassessment - multiple wounds []  - 0 Dermatologic / Skin Assessment (not related to wound area) ASSESSMENTS - Focused Assessment []  - 0 Circumferential Edema Measurements - multi extremities []  - 0 Nutritional Assessment / Counseling / Intervention X- 1 5 Lower Extremity Assessment (monofilament, tuning fork, pulses) []  - 0 Peripheral Arterial Disease Assessment (using hand held doppler) ASSESSMENTS - Ostomy and/or Continence Assessment and Care []  - 0 Incontinence Assessment and Management []  - 0 Ostomy Care Assessment and Management (repouching, etc.) PROCESS - Coordination of Care X - Simple Patient / Family Education for ongoing care 1 15 []  - 0 Complex (extensive) Patient / Family Education for ongoing care X- 1 10 Staff obtains , Records, T Results / Process Orders est []  - 0 Staff telephones HHA, Nursing Homes / Clarify orders / etc []  - 0 Routine Transfer to another Facility (non-emergent condition) []  - 0 Routine Hospital Admission (non-emergent condition) []  - 0 New Admissions / / Ordering NPWT Apligraf, etc. , []  - 0 Emergency Hospital Admission (emergent condition) X- 1 10 Simple Discharge Coordination []  - 0 Complex (extensive) Discharge Coordination PROCESS - Special Needs []  - 0 Pediatric / Minor Patient Management []  - 0 Isolation Patient Management []  - 0 Hearing / Language / Visual special needs []  - 0 Assessment of Community assistance (transportation, D/C planning, etc.) []  - 0 Additional assistance / Altered mentation []  - 0 Support Surface(s) Assessment (bed, cushion, seat, etc.) INTERVENTIONS - Wound Cleansing / Measurement []  -  0 Simple Wound Cleansing - one wound X- 2 5 Complex Wound Cleansing - multiple wounds X- 1 5 Wound Imaging (photographs - any number of wounds) []  - 0  Wound Tracing (instead of photographs) []  - 0 Simple Wound Measurement - one wound []  - 0 Complex Wound Measurement - multiple wounds INTERVENTIONS - Wound Dressings []  - 0 Small Wound Dressing one or multiple wounds []  - 0 Medium Wound Dressing one or multiple wounds []  - 0 Large Wound Dressing one or multiple wounds X- 1 5 Application of Medications - topical []  - 0 Application of Medications - injection INTERVENTIONS - Miscellaneous []  - 0 External ear exam []  - 0 Specimen Collection (cultures, biopsies, blood, body fluids, etc.) []  - 0 Specimen(s) / Culture(s) sent or taken to Lab for analysis []  - 0 Patient Transfer (multiple staff / / Similar devices) []  - 0 Simple Staple / Suture removal (25 or less) []  - 0 Complex Staple / Suture removal (26 or more) []  - 0 Hypo / Hyperglycemic Management (close monitor of Blood Glucose) []  - 0 Ankle / Brachial Index (ABI) - do not check if billed separately X- 1 5 Vital Signs Has the patient been seen at the hospital within the last three years: Yes Total Score: 90 Level Of Care: New/Established - Level 3 Electronic Signature(s) Signed: 06/12/2020 6:00:02 PM By: RN, BSN Entered By: on 06/12/2020 14:17:45 -------------------------------------------------------------------------------- Encounter Discharge Information Details Patient Name: Date of Service: [name], [name] 06/12/2020 1:15 PM Medical Record Number: Patient Account Number: Date of Birth/Sex: Treating RN: 27-Mar-1961 (59 y.o. Nurse, adult Primary Care Danitza Schoenfeldt: Other Clinician: Referring Latressa Harries: Treating Jeter Tomey/Extender: in Treatment: 1 Encounter Discharge Information Items Discharge  Condition: Stable Ambulatory Status: Ambulatory Discharge Destination: Home Transportation: Private Auto Accompanied By: self Schedule Follow-up Appointment: No Clinical Summary of Care: Notes educated patient on how to apply stockings. Electronic Signature(s) Signed: 06/12/2020 5:29:13 PM By: Entered By: 08/12/2020 on 06/12/2020 14:35:41 -------------------------------------------------------------------------------- Lower Extremity Assessment Details Patient Name: Date of Service: [name], [name] 06/12/2020 1:15 PM Medical Record Number: Bobbe Medico Patient Account Number: 08/12/2020 Date of Birth/Sex: Treating RN: 1960-08-06 (60 y.o. 01/04/1961, [name] Primary Care Kyle Stansell: [name] Other Clinician: Referring Chukwudi Ewen: Treating Mykell Genao/Extender: [name] in Treatment: 1 Edema Assessment Assessed: [name]: Yes] 08/12/2020: Yes] Edema: [Left: Yes] [Right: Yes] Calf Left: Right: Point of Measurement: 37 cm From Medial Instep 39 cm 40 cm Ankle Left: Right: Point of Measurement: 8 cm From Medial Instep 26 cm 26 cm Vascular Assessment Pulses: Dorsalis Pedis Palpable: [Left:Yes] [Right:Yes] Posterior Tibial Palpable: [Left:Yes] [Right:Yes] Electronic Signature(s) Signed: 06/12/2020 5:22:03 PM By: Shawn Stall RN Entered By: 08/12/2020 on 06/12/2020 13:51:07 -------------------------------------------------------------------------------- Multi-Disciplinary Care Plan Details Patient Name: Date of Service: [name], [name] 06/12/2020 1:15 PM Medical Record Number: [id number] Patient Account Number: 01/04/1961 Date of Birth/Sex: Treating RN: 1961/03/03 (59 y.o. [name] Primary Care Ryelan Kazee: [name] Other Clinician: Referring Frances Ambrosino: Treating Raisha Brabender/Extender: [name] in Treatment: 1 Multidisciplinary Care Plan reviewed with physician Active Inactive Electronic  Signature(s) Signed: 06/12/2020 6:00:02 PM By: [name] RN, BSN Previous Signature: 06/12/2020 1:24:02 PM Version By: Fonnie Mu Entered By: Fonnie Mu on 06/12/2020 14:16:52 -------------------------------------------------------------------------------- Pain Assessment Details Patient Name: Date of Service: [name], [name] 06/12/2020 1:15 PM Medical Record Number: [id number] Patient Account Number: [id number] Date of Birth/Sex: Treating RN: 24-Jun-1960 (59 y.o. [name] Primary Care Therma Lasure: [name] Other Clinician: Referring Kaniesha Barile: Treating Jillian Pianka/Extender: [name] in Treatment: 1 Active Problems Location of Pain Severity  and Description of Pain Patient Has Paino No Site Locations Pain Management and Medication Current Pain Management: Electronic Signature(s) Signed: 06/12/2020 6:00:02 PM By: [name] RN, BSN Signed: 06/15/2020 7:46:34 AM By: [name] Entered By: [name] on 06/12/2020 13:35:26 -------------------------------------------------------------------------------- Patient/Caregiver Education Details Patient Name: Date of Service: [name], [name] 3/11/2022andnbsp1:15 PM Medical Record Number: [id number] Patient Account Number: [id number] Date of Birth/Gender: Treating RN: [birth year] (60 y.o. [name] Primary Care Physician: [name] Other Clinician: Referring Physician: Treating Physician/Extender: [name] in Treatment: 1 Education Assessment Education Provided To: Patient Education Topics Provided Venous: Methods: Demonstration, Explain/Verbal, Printed Responses: State content correctly Wound/Skin Impairment: Methods: Explain/Verbal, Printed Responses: State content correctly Electronic Signature(s) Signed: 06/12/2020 2:11:13 PM By: Antonieta Iba Entered By: Antonieta Iba on 06/12/2020  13:24:36 -------------------------------------------------------------------------------- Wound Assessment Details Patient Name: Date of Service: [name], [name] 06/12/2020 1:15 PM Medical Record Number: [id number] Patient Account Number: [id number] Date of Birth/Sex: Treating RN: 10/13/1960 (59 y.o. [name], [name] Primary Care Nikita Humble: [name] Other Clinician: Referring Soo Steelman: Treating Kavi Almquist/Extender: [name] in Treatment: 1 Wound Status Wound Number: 3 Primary Etiology: Lymphedema Wound Location: Right, Posterior Lower Leg Wound Status: Open Wounding Event: Gradually Appeared Date Acquired: 05/18/2020 Weeks Of Treatment: 1 Clustered Wound: No Wound Measurements Length: (cm) Width: (cm) Depth: (cm) Area: (cm) Volume: (cm) 0 % Reduction in Area: 100% 0 % Reduction in Volume: 100% 0 0 0 Wound Description Classification: Full Thickness Without Exposed Support Structur es Electronic Signature(s) Signed: 06/12/2020 5:22:03 PM By: Fonnie Mu RN Entered By: Fonnie Mu on 06/12/2020 13:51:15 -------------------------------------------------------------------------------- Wound Assessment Details Patient Name: Date of Service: [name], [name] 06/12/2020 1:15 PM Medical Record Number: [id number] Patient Account Number: [id number] Date of Birth/Sex: Treating RN: 01/05/1961 (59 y.o. [name], [name] Primary Care Kaian Fahs: [name] Other Clinician: Referring Eiman Maret: Treating Ohanna Gassert/Extender: [name] in Treatment: 1 Wound Status Wound Number: 4 Primary Etiology: Lymphedema Wound Location: Left, Posterior Lower Leg Wound Status: Open Wounding Event: Gradually Appeared Date Acquired: 05/18/2020 Weeks Of Treatment: 1 Clustered Wound: No Wound Measurements Length: (cm) Width: (cm) Depth: (cm) Area: (cm) Volume: (cm) 0 % Reduction in Area: 100% 0 % Reduction in  Volume: 100% 0 0 0 Wound Description Classification: Full Thickness Without Exposed Support Structur es Electronic Signature(s) Signed: 06/12/2020 5:22:03 PM By: Fonnie Mu RN Entered By: Fonnie Mu on 06/12/2020 13:51:15 -------------------------------------------------------------------------------- Vitals Details Patient Name: Date of Service: [name], [name] 06/12/2020 1:15 PM Medical Record Number: [id number] Patient Account Number: [id number] Date of Birth/Sex: Treating RN: 07/03/1960 (59 y.o. [name] Primary Care Takoda Siedlecki: [name] Other Clinician: Referring Chidi Shirer: Treating Kasson [name]/Extender: [name] in Treatment: 1 Vital Signs Time Taken: 13:35 Temperature (F): 98.3 Pulse (bpm): 65 Respiratory Rate (breaths/min): 18 Blood Pressure (mmHg): 145/89 Reference Range: 80 - 120 mg / dl Electronic Signature(s) Signed: 06/15/2020 7:46:34 AM By: [name] Entered By: [name] on 06/12/2020 13:35:21

## 2020-06-17 NOTE — Telephone Encounter (Signed)
Patient is scheduled for CPAP Titration on 08/03/20. Patient understands his titration study will be done at [facility] sleep lab. Patient understands he will receive a letter in a week or so detailing appointment, date, time, and location. Patient understands to call if he does not receive the letter  in a timely manner. Patient agrees with treatment and thanked me for call.

## 2020-07-10 ENCOUNTER — Encounter (HOSPITAL_BASED_OUTPATIENT_CLINIC_OR_DEPARTMENT_OTHER): Payer: Medicaid Other | Admitting: Internal Medicine
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
## 2020-08-03 ENCOUNTER — Ambulatory Visit (HOSPITAL_BASED_OUTPATIENT_CLINIC_OR_DEPARTMENT_OTHER): Payer: Medicaid Other | Attending: Cardiology | Admitting: Cardiology

## 2020-08-03 ENCOUNTER — Ambulatory Visit (INDEPENDENT_AMBULATORY_CARE_PROVIDER_SITE_OTHER): Payer: Medicaid Other | Admitting: Cardiology

## 2020-08-03 ENCOUNTER — Encounter: Payer: Self-pay | Admitting: Cardiology

## 2020-08-03 ENCOUNTER — Other Ambulatory Visit: Payer: Self-pay

## 2020-08-03 VITALS — BP 152/90 | HR 70 | Ht 69.0 in | Wt 204.6 lb

## 2020-08-03 VITALS — Ht 69.0 in | Wt 204.0 lb

## 2020-08-03 DIAGNOSIS — E785 Hyperlipidemia, unspecified: Secondary | ICD-10-CM

## 2020-08-03 DIAGNOSIS — G4733 Obstructive sleep apnea (adult) (pediatric): Secondary | ICD-10-CM

## 2020-08-03 DIAGNOSIS — R9431 Abnormal electrocardiogram [ECG] [EKG]: Secondary | ICD-10-CM

## 2020-08-03 DIAGNOSIS — R400 Somnolence: Secondary | ICD-10-CM | POA: Diagnosis not present

## 2020-08-03 DIAGNOSIS — I10 Essential (primary) hypertension: Secondary | ICD-10-CM

## 2020-08-03 DIAGNOSIS — I350 Nonrheumatic aortic (valve) stenosis: Secondary | ICD-10-CM

## 2020-08-03 DIAGNOSIS — I5032 Chronic diastolic (congestive) heart failure: Secondary | ICD-10-CM | POA: Diagnosis not present

## 2020-08-03 LAB — BASIC METABOLIC PANEL
BUN/Creatinine Ratio: 14 (ref 10–24)
BUN: 19 mg/dL (ref 8–27)
CO2: 24 mmol/L (ref 20–29)
Calcium: 9.6 mg/dL (ref 8.6–10.2)
Chloride: 102 mmol/L (ref 96–106)
Creatinine, Ser: 1.38 mg/dL — ABNORMAL HIGH (ref 0.76–1.27)
Glucose: 135 mg/dL — ABNORMAL HIGH (ref 65–99)
Potassium: 3.5 mmol/L (ref 3.5–5.2)
Sodium: 141 mmol/L (ref 134–144)
eGFR: 59 mL/min/{1.73_m2} — ABNORMAL LOW (ref >59)

## 2020-08-03 LAB — MAGNESIUM: Magnesium: 1.8 mg/dL (ref 1.6–2.3)

## 2020-08-03 NOTE — Patient Instructions (Addendum)
Medication Instructions:  Your physician recommends that you continue on your current medications as directed. Please refer to the Current Medication list given to you today.  *If you need a refill on your cardiac medications before your next appointment, please call your pharmacy*   Lab Work: BMET, Mag today  If you have labs (blood work) drawn today and your tests are completely normal, you will receive your results only by: Marland Kitchen MyChart Message (if you have MyChart) OR . A paper copy in the mail If you have any lab test that is abnormal or we need to change your treatment, we will call you to review the results.  Follow-Up: At [facility], you and your health needs are our priority.  As part of our continuing mission to provide you with exceptional heart care, we have created designated Provider Care Teams.  These Care Teams include your primary Cardiologist (physician) and Advanced Practice Providers (APPs -  Physician Assistants and Nurse Practitioners) who all work together to provide you with the care you need, when you need it.  We recommend signing up for the patient portal called "MyChart".  Sign up information is provided on this After Visit Summary.  MyChart is used to connect with patients for Virtual Visits (Telemedicine).  Patients are able to view lab/test results, encounter notes, upcoming appointments, etc.  Non-urgent messages can be sent to your provider as well.   To learn more about what you can do with MyChart, go to [email or web address].    Your next appointment:   July/August after echocardiogram with Dr. Bjorn Pippin  2 weeks with pharmacist (BP management)   Other Instructions Please check your blood pressure at home daily, write it down.  Bring blood pressure log and blood pressure cuff to appointment with pharmacist.
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
## 2020-08-04 ENCOUNTER — Other Ambulatory Visit (HOSPITAL_BASED_OUTPATIENT_CLINIC_OR_DEPARTMENT_OTHER): Payer: Self-pay

## 2020-08-04 ENCOUNTER — Other Ambulatory Visit: Payer: Self-pay | Admitting: *Deleted

## 2020-08-04 DIAGNOSIS — I5032 Chronic diastolic (congestive) heart failure: Secondary | ICD-10-CM

## 2020-08-04 DIAGNOSIS — Z79899 Other long term (current) drug therapy: Secondary | ICD-10-CM

## 2020-08-04 DIAGNOSIS — R400 Somnolence: Secondary | ICD-10-CM

## 2020-08-04 MED ORDER — POTASSIUM CHLORIDE ER 20 MEQ PO TBCR: 20.0000 meq | EXTENDED_RELEASE_TABLET | Freq: Two times a day (BID) | ORAL | 3 refills | Status: DC

## 2020-08-06 NOTE — Procedures (Signed)
   Patient Name: [name], [name] Date: 08/03/2020 Gender: Male D.O.B: 04/19/1960 Age (years): 59 Referring Provider: Epifanio Lesches Height (inches): 69 Interpreting Physician: Armanda Magic MD, ABSM Weight (lbs): 204 RPSGT: Cherylann Parr BMI: 30 MRN: [id number] Neck Size: 16.00  CLINICAL INFORMATION The patient is referred for a CPAP titration to treat sleep apnea.  SLEEP STUDY TECHNIQUE As per the AASM Manual for the Scoring of Sleep and Associated Events v2.3 (April 2016) with a hypopnea requiring 4% desaturations.  The channels recorded and monitored were frontal, central and occipital EEG, electrooculogram (EOG), submentalis EMG (chin), nasal and oral airflow, thoracic and abdominal wall motion, anterior tibialis EMG, snore microphone, electrocardiogram, and pulse oximetry. Continuous positive airway pressure (CPAP) was initiated at the beginning of the study and titrated to treat sleep-disordered breathing.  MEDICATIONS Medications self-administered by patient taken the night of the study : N/A  TECHNICIAN COMMENTS Comments added by technician: SIMPLUS FULL FACE MASK MEDIUM Comments added by scorer: N/A  RESPIRATORY PARAMETERS Optimal PAP Pressure (cm): 13  AHI at Optimal Pressure (/hr):1.6 Overall Minimal O2 (%):79.0  Supine % at Optimal Pressure (%):100 Minimal O2 at Optimal Pressure (%): 89.0   SLEEP ARCHITECTURE The study was initiated at 10:10:19 PM and ended at 5:04:50 AM.  Sleep onset time was 2.2 minutes and the sleep efficiency was 94.0%. The total sleep time was 389.5 minutes.  The patient spent 2.3% of the night in stage N1 sleep, 79.3% in stage N2 sleep, 0.0% in stage N3 and 18.4% in REM.Stage REM latency was 9.5 minutes  Wake after sleep onset was 22.8. Alpha intrusion was absent. Supine sleep was 100.00%.  CARDIAC DATA The 2 lead EKG demonstrated sinus rhythm. The mean heart rate was 70.5 beats per minute. Other EKG findings include: None.  LEG  MOVEMENT DATA The total Periodic Limb Movements of Sleep (PLMS) were 0. The PLMS index was 0.0. A PLMS index of <15 is considered normal in adults.  IMPRESSIONS - The optimal PAP pressure was 13 cm of water. - Central sleep apnea was not noted during this titration (CAI = 2.3/h). - Severe oxygen desaturations were observed during this titration (min O2 = 79.0%). - No snoring was audible during this study. - No cardiac abnormalities were observed during this study. - Clinically significant periodic limb movements were not noted during this study. Arousals associated with PLMs were rare.  DIAGNOSIS - Obstructive Sleep Apnea (G47.33)  RECOMMENDATIONS - Trial of CPAP therapy on 13 cm H2O with a Medium size Fisher&Paykel Full Face Mask Simplus mask and heated humidification. - Avoid alcohol, sedatives and other CNS depressants that may worsen sleep apnea and disrupt normal sleep architecture. - Sleep hygiene should be reviewed to assess factors that may improve sleep quality. - Weight management and regular exercise should be initiated or continued. - Return to Sleep Center for re-evaluation after 6 weeks of therapy  [Electronically signed] 08/06/2020 03:13 PM  Armanda Magic MD, ABSM Diplomate, American Board of Sleep Medicine

## 2020-08-07 NOTE — Progress Notes (Addendum)
Result of sleep study seen  Dr Mayford Knife sent the cpap orders

## 2020-08-08 ENCOUNTER — Telehealth: Payer: Self-pay | Admitting: *Deleted

## 2020-08-08 NOTE — Telephone Encounter (Signed)
The patient has been notified of the result and verbalized understanding.  All questions (if any) were answered. Patient understands his sleep study showed they had a successful PAP titration and let DME know that orders are in EPIC. Please set up 4 week OV with me.   Upon patient request DME selection is [facility]. Patient understands he will be contacted by [facility] to set up his cpap. Patient understands to call if [facility] does not contact him with new setup in a timely manner. Patient understands they will be called once confirmation has been received from [facility] that they have received their new machine to schedule 10 week follow up appointment.   [facility] notified of new cpap order  Please add to airview Patient was grateful for the call and thanked me

## 2020-08-08 NOTE — Telephone Encounter (Signed)
-----   Message from Quintella Reichert, MD sent at 08/06/2020  3:15 PM EDT ----- Please let patient know that they had a successful PAP titration and let DME know that orders are in EPIC.  Please set up 6 week OV with me.

## 2020-08-12 ENCOUNTER — Encounter: Payer: Self-pay | Admitting: *Deleted

## 2020-08-19 ENCOUNTER — Ambulatory Visit (INDEPENDENT_AMBULATORY_CARE_PROVIDER_SITE_OTHER): Payer: Medicaid Other | Admitting: Pharmacist

## 2020-08-19 ENCOUNTER — Other Ambulatory Visit: Payer: Self-pay

## 2020-08-19 VITALS — BP 128/78 | HR 74

## 2020-08-19 DIAGNOSIS — I10 Essential (primary) hypertension: Secondary | ICD-10-CM | POA: Diagnosis not present

## 2020-08-19 LAB — BASIC METABOLIC PANEL
BUN/Creatinine Ratio: 13 (ref 10–24)
BUN: 17 mg/dL (ref 8–27)
CO2: 21 mmol/L (ref 20–29)
Calcium: 9.6 mg/dL (ref 8.6–10.2)
Chloride: 102 mmol/L (ref 96–106)
Creatinine, Ser: 1.31 mg/dL — ABNORMAL HIGH (ref 0.76–1.27)
Glucose: 107 mg/dL — ABNORMAL HIGH (ref 65–99)
Potassium: 3.8 mmol/L (ref 3.5–5.2)
Sodium: 140 mmol/L (ref 134–144)
eGFR: 62 mL/min/{1.73_m2} (ref >59)

## 2020-08-19 NOTE — Telephone Encounter (Signed)
Pt in office today and states he hasn't been called by [facility] to set up his CPAP yet - forwarding to [facility] for follow up.

## 2020-08-19 NOTE — Telephone Encounter (Signed)
Reached out to patient per dpr on his wife's phone number because his phone was unavailable. I spoke to the patient and provided him the phone number to his dme [facility] to check on his status. Pt was agreeable treatment.
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
## 2020-08-19 NOTE — Patient Instructions (Addendum)
It was nice to meet you today  Your blood pressure goal is < 130/71mmHg  Move your second carvedilol to 10pm with your other night medications  Continue taking your medications and monitor your blood pressure at home

## 2020-08-25 ENCOUNTER — Encounter: Payer: Self-pay | Admitting: *Deleted
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
## 2020-10-27 ENCOUNTER — Ambulatory Visit (HOSPITAL_COMMUNITY): Payer: Medicaid Other | Attending: Internal Medicine

## 2020-10-27 ENCOUNTER — Other Ambulatory Visit: Payer: Self-pay

## 2020-10-27 DIAGNOSIS — I350 Nonrheumatic aortic (valve) stenosis: Secondary | ICD-10-CM | POA: Diagnosis not present

## 2020-10-27 LAB — ECHOCARDIOGRAM COMPLETE
AR max vel: 0.97 cm2
AV Area VTI: 1.1 cm2
AV Area mean vel: 0.93 cm2
AV Mean grad: 33 mmHg
AV Peak grad: 56.3 mmHg
Ao pk vel: 3.75 m/s
Area-P 1/2: 4.63 cm2
P 1/2 time: 305 msec
S' Lateral: 2.8 cm

## 2020-10-28 ENCOUNTER — Encounter: Payer: Self-pay | Admitting: Cardiology

## 2020-10-28 ENCOUNTER — Ambulatory Visit: Payer: Medicaid Other | Admitting: Cardiology

## 2020-10-28 VITALS — BP 170/98 | HR 73 | Resp 20 | Ht 69.0 in | Wt 214.4 lb

## 2020-10-28 DIAGNOSIS — I350 Nonrheumatic aortic (valve) stenosis: Secondary | ICD-10-CM | POA: Diagnosis not present

## 2020-10-28 DIAGNOSIS — I10 Essential (primary) hypertension: Secondary | ICD-10-CM

## 2020-10-28 DIAGNOSIS — G4733 Obstructive sleep apnea (adult) (pediatric): Secondary | ICD-10-CM

## 2020-10-28 DIAGNOSIS — I5032 Chronic diastolic (congestive) heart failure: Secondary | ICD-10-CM | POA: Diagnosis not present

## 2020-10-28 DIAGNOSIS — E785 Hyperlipidemia, unspecified: Secondary | ICD-10-CM | POA: Diagnosis not present

## 2020-10-28 LAB — BASIC METABOLIC PANEL
BUN/Creatinine Ratio: 19 (ref 10–24)
BUN: 25 mg/dL (ref 8–27)
CO2: 22 mmol/L (ref 20–29)
Calcium: 9.9 mg/dL (ref 8.6–10.2)
Chloride: 102 mmol/L (ref 96–106)
Creatinine, Ser: 1.32 mg/dL — ABNORMAL HIGH (ref 0.76–1.27)
Glucose: 110 mg/dL — ABNORMAL HIGH (ref 65–99)
Potassium: 3.8 mmol/L (ref 3.5–5.2)
Sodium: 141 mmol/L (ref 134–144)
eGFR: 62 mL/min/{1.73_m2} (ref >59)

## 2020-10-28 LAB — MAGNESIUM: Magnesium: 1.8 mg/dL (ref 1.6–2.3)

## 2020-10-28 NOTE — Patient Instructions (Signed)
Medication Instructions:  Your physician recommends that you continue on your current medications as directed. Please refer to the Current Medication list given to you today.  *If you need a refill on your cardiac medications before your next appointment, please call your pharmacy*   Lab Work: BMET, Mag today  If you have labs (blood work) drawn today and your tests are completely normal, you will receive your results only by: MyChart Message (if you have MyChart) OR A paper copy in the mail If you have any lab test that is abnormal or we need to change your treatment, we will call you to review the results.  Follow-Up: At [facility], you and your health needs are our priority.  As part of our continuing mission to provide you with exceptional heart care, we have created designated Provider Care Teams.  These Care Teams include your primary Cardiologist (physician) and Advanced Practice Providers (APPs -  Physician Assistants and Nurse Practitioners) who all work together to provide you with the care you need, when you need it.  We recommend signing up for the patient portal called "MyChart".  Sign up information is provided on this After Visit Summary.  MyChart is used to connect with patients for Virtual Visits (Telemedicine).  Patients are able to view lab/test results, encounter notes, upcoming appointments, etc.  Non-urgent messages can be sent to your provider as well.   To learn more about what you can do with MyChart, go to [email or web address].    Your next appointment:   2 weeks with pharmacist (blood pressure follow up)  --bring all medications with you  --bring blood pressure cuff and blood pressure log   3 months with PA/NP 6 months with Dr. Bjorn Pippin   Other Instructions Please check your blood pressure at home twice daily, write it down.  Bring blood pressure log to next appointment with pharmacist

## 2020-10-29 ENCOUNTER — Encounter: Payer: Self-pay | Admitting: *Deleted

## 2020-11-05 DIAGNOSIS — M1990 Unspecified osteoarthritis, unspecified site: Secondary | ICD-10-CM | POA: Insufficient documentation

## 2020-11-05 DIAGNOSIS — G4733 Obstructive sleep apnea (adult) (pediatric): Secondary | ICD-10-CM | POA: Insufficient documentation

## 2020-11-05 DIAGNOSIS — M549 Dorsalgia, unspecified: Secondary | ICD-10-CM | POA: Insufficient documentation

## 2020-11-05 DIAGNOSIS — Z872 Personal history of diseases of the skin and subcutaneous tissue: Secondary | ICD-10-CM | POA: Insufficient documentation

## 2020-11-05 DIAGNOSIS — G8929 Other chronic pain: Secondary | ICD-10-CM | POA: Insufficient documentation

## 2020-11-12 ENCOUNTER — Ambulatory Visit: Payer: Medicaid Other | Admitting: Pharmacist Clinician (PhC)/ Clinical Pharmacy Specialist

## 2020-11-12 ENCOUNTER — Other Ambulatory Visit: Payer: Self-pay

## 2020-11-12 DIAGNOSIS — I10 Essential (primary) hypertension: Secondary | ICD-10-CM

## 2020-11-12 MED ORDER — OLMESARTAN MEDOXOMIL 20 MG PO TABS: 20.0000 mg | ORAL_TABLET | Freq: Every day | ORAL | 6 refills | Status: DC

## 2020-11-12 NOTE — Patient Instructions (Signed)
Return for a a follow up appointment Sept 8 at 10 am  Go to the lab at next appointment to check kidney function  Check your blood pressure at home daily and keep record of the readings.  Take your BP meds as follows:  Start olmesartan 20 mg once daily  Cut clonidine tablets in half and take 1/2 tablet three times daily  Continue with all other medications  To ask about your CPAP, you can call [facility] at [phone]  Bring all of your meds, your BP cuff and your record of home blood pressures to your next appointment.  Exercise as you're able, try to walk approximately 30 minutes per day.  Keep salt intake to a minimum, especially watch canned and prepared boxed foods.  Eat more fresh fruits and vegetables and fewer canned items.  Avoid eating in fast food restaurants.    HOW TO TAKE YOUR BLOOD PRESSURE: Rest 5 minutes before taking your blood pressure.  Don't smoke or drink caffeinated beverages for at least 30 minutes before. Take your blood pressure before (not after) you eat. Sit comfortably with your back supported and both feet on the floor (don't cross your legs). Elevate your arm to heart level on a table or a desk. Use the proper sized cuff. It should fit smoothly and snugly around your bare upper arm. There should be enough room to slip a fingertip under the cuff. The bottom edge of the cuff should be 1 inch above the crease of the elbow. Ideally, take 3 measurements at one sitting and record the average.
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
## 2020-11-13 ENCOUNTER — Encounter: Payer: Self-pay | Admitting: Pharmacist Clinician (PhC)/ Clinical Pharmacy Specialist

## 2020-11-13 NOTE — Assessment & Plan Note (Signed)
Patient with resistant hypertension, still not at goal despite multiple medications.  I suspect some of the issue is compliance mixed with a complex regimen of qd, bid and tid dosing.  He has never been on an ARB, and although he has a low renin, there is still some benefit to use of these medications.  Would like to get him off clonidine and eventually get tid hydralazine backed off if possible.  He will do much better with a simpler regimen and smaller pill burden.  Today will have him start olmesartan 20 mg daily and cut the clonidine to 0.1 mg tid.  Because he has plenty of the 0.2 mg tablets, he was given a pill cutter and asked to just cut his tabs in half.  Although he is listed as CKD stage 3, his most recent GFR was 63.  We can also consider adding chlorthalidone or spironolactone as better once daily options as we wean him off the clonidine.  Will need to carefully monitor renal function as we go, but he will be better in the long run if we can keep his pressure at or close to goal.  Will see him back in 4 weeks for follow up.

## 2020-12-10 ENCOUNTER — Ambulatory Visit: Payer: Medicaid Other

## 2020-12-15 ENCOUNTER — Ambulatory Visit: Payer: Medicaid Other | Admitting: Pharmacist

## 2020-12-15 ENCOUNTER — Other Ambulatory Visit: Payer: Self-pay

## 2020-12-15 VITALS — BP 140/86 | HR 70 | Resp 16 | Ht 69.0 in | Wt 217.2 lb

## 2020-12-15 DIAGNOSIS — E7841 Elevated Lipoprotein(a): Secondary | ICD-10-CM

## 2020-12-15 DIAGNOSIS — B86 Scabies: Secondary | ICD-10-CM

## 2020-12-15 DIAGNOSIS — I5032 Chronic diastolic (congestive) heart failure: Secondary | ICD-10-CM | POA: Diagnosis not present

## 2020-12-15 MED ORDER — OLMESARTAN MEDOXOMIL 40 MG PO TABS: 40.0000 mg | ORAL_TABLET | Freq: Every day | ORAL | 1 refills | Status: DC
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
## 2020-12-15 NOTE — Patient Instructions (Addendum)
It was nice meeting you today!  We would like your blood pressure to be less than 130/80  Continue amlodipine 10mg  daily, clonidine 0.1mg  three times a day, furosemide 40mg  twice a day, hydralazine 50mg  three times a day, and carvedilol 25mg  twice a day  We are going to increase your olmesartan to 40mg  once a day  We will also check your lab work today  Try to increase your exercise up to 30 minutes a day at least 5 days a week  Keep reducing your salt intake  , PharmD, BCACP, CDCES, CPP [facility] [address] [place], [place] Phone: [phone]; Fax: [phone] 12/15/2020 1:49 PM

## 2020-12-16 LAB — BASIC METABOLIC PANEL
BUN/Creatinine Ratio: 14 (ref 10–24)
BUN: 18 mg/dL (ref 8–27)
CO2: 24 mmol/L (ref 20–29)
Calcium: 9.8 mg/dL (ref 8.6–10.2)
Chloride: 103 mmol/L (ref 96–106)
Creatinine, Ser: 1.33 mg/dL — ABNORMAL HIGH (ref 0.76–1.27)
Glucose: 101 mg/dL — ABNORMAL HIGH (ref 65–99)
Potassium: 4 mmol/L (ref 3.5–5.2)
Sodium: 142 mmol/L (ref 134–144)
eGFR: 61 mL/min/{1.73_m2} (ref >59)

## 2021-02-01 ENCOUNTER — Encounter: Payer: Self-pay | Admitting: Physician Assistant

## 2021-02-01 ENCOUNTER — Ambulatory Visit: Payer: Medicaid Other | Admitting: Physician Assistant

## 2021-02-01 ENCOUNTER — Other Ambulatory Visit: Payer: Self-pay

## 2021-02-01 VITALS — BP 122/88 | HR 60 | Ht 69.0 in | Wt 219.4 lb

## 2021-02-01 DIAGNOSIS — K551 Chronic vascular disorders of intestine: Secondary | ICD-10-CM | POA: Diagnosis not present

## 2021-02-01 DIAGNOSIS — I5032 Chronic diastolic (congestive) heart failure: Secondary | ICD-10-CM | POA: Diagnosis not present

## 2021-02-01 DIAGNOSIS — I350 Nonrheumatic aortic (valve) stenosis: Secondary | ICD-10-CM

## 2021-02-01 DIAGNOSIS — I10 Essential (primary) hypertension: Secondary | ICD-10-CM | POA: Diagnosis not present

## 2021-02-01 DIAGNOSIS — E785 Hyperlipidemia, unspecified: Secondary | ICD-10-CM

## 2021-02-01 NOTE — Patient Instructions (Signed)
Medication Instructions:  The current medical regimen is effective;  continue present plan and medications as directed. Please refer to the Current Medication list given to you today.  *If you need a refill on your cardiac medications before your next appointment, please call your pharmacy*  Lab Work: NONE  Testing/Procedures: Echocardiogram (IN [place], BEFORE SCHEDULED APPOINTMENT WITH DR [facility])- Your physician has requested that you have an echocardiogram. Echocardiography is a painless test that uses sound waves to create images of your heart. It provides your doctor with information about the size and shape of your heart and how well your heart's chambers and valves are working. This procedure takes approximately one hour. There are no restrictions for this procedure. This will be performed at our [facility] location - [address], [address].  Follow-Up: Your next appointment:  KEEP SCHEDULED APPOINTMENT  In Person with Epifanio Lesches, MD   At [facility], you and your health needs are our priority.  As part of our continuing mission to provide you with exceptional heart care, we have created designated Provider Care Teams.  These Care Teams include your primary Cardiologist (physician) and Advanced Practice Providers (APPs -  Physician Assistants and Nurse Practitioners) who all work together to provide you with the care you need, when you need it.
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
## 2021-02-03 ENCOUNTER — Encounter: Payer: Self-pay | Admitting: Physician Assistant

## 2021-03-18 ENCOUNTER — Other Ambulatory Visit: Payer: Self-pay

## 2021-03-18 ENCOUNTER — Ambulatory Visit (HOSPITAL_COMMUNITY): Payer: Medicaid Other | Attending: Cardiology

## 2021-03-18 DIAGNOSIS — I350 Nonrheumatic aortic (valve) stenosis: Secondary | ICD-10-CM | POA: Diagnosis present

## 2021-03-18 LAB — ECHOCARDIOGRAM COMPLETE
AR max vel: 0.95 cm2
AV Area VTI: 1.05 cm2
AV Area mean vel: 1.03 cm2
AV Mean grad: 33.3 mmHg
AV Peak grad: 63.9 mmHg
Ao pk vel: 4 m/s
Area-P 1/2: 4.03 cm2
MV M vel: 6.36 m/s
MV Peak grad: 161.8 mmHg
P 1/2 time: 695 msec
S' Lateral: 2.5 cm
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
## 2021-04-07 ENCOUNTER — Encounter: Payer: Self-pay | Admitting: Cardiology

## 2021-04-07 ENCOUNTER — Other Ambulatory Visit: Payer: Self-pay

## 2021-04-07 ENCOUNTER — Ambulatory Visit: Payer: Medicaid Other | Admitting: Cardiology

## 2021-04-07 VITALS — BP 122/78 | HR 63 | Ht 69.0 in | Wt 215.0 lb

## 2021-04-07 DIAGNOSIS — I5032 Chronic diastolic (congestive) heart failure: Secondary | ICD-10-CM | POA: Diagnosis not present

## 2021-04-07 DIAGNOSIS — I350 Nonrheumatic aortic (valve) stenosis: Secondary | ICD-10-CM | POA: Diagnosis not present

## 2021-04-07 DIAGNOSIS — I10 Essential (primary) hypertension: Secondary | ICD-10-CM

## 2021-04-07 DIAGNOSIS — I517 Cardiomegaly: Secondary | ICD-10-CM | POA: Diagnosis not present

## 2021-04-07 DIAGNOSIS — I4891 Unspecified atrial fibrillation: Secondary | ICD-10-CM

## 2021-04-07 MED ORDER — APIXABAN 5 MG PO TABS: 5.0000 mg | ORAL_TABLET | Freq: Two times a day (BID) | ORAL | 3 refills | Status: DC

## 2021-04-07 NOTE — Patient Instructions (Addendum)
Medication Instructions:  START Eliquis 5 mg twice daily   *If you need a refill on your cardiac medications before your next appointment, please call your pharmacy*   Lab Work: CBc, BMET, MAG today   If you have labs (blood work) drawn today and your tests are completely normal, you will receive your results only by: MyChart Message (if you have MyChart) OR A paper copy in the mail If you have any lab test that is abnormal or we need to change your treatment, we will call you to review the results.   Testing/Procedures: CARDIAC MRI    Follow-Up: At [facility], you and your health needs are our priority.  As part of our continuing mission to provide you with exceptional heart care, we have created designated Provider Care Teams.  These Care Teams include your primary Cardiologist (physician) and Advanced Practice Providers (APPs -  Physician Assistants and Nurse Practitioners) who all work together to provide you with the care you need, when you need it.  We recommend signing up for the patient portal called "MyChart".  Sign up information is provided on this After Visit Summary.  MyChart is used to connect with patients for Virtual Visits (Telemedicine).  Patients are able to view lab/test results, encounter notes, upcoming appointments, etc.  Non-urgent messages can be sent to your provider as well.   To learn more about what you can do with MyChart, go to [email or web address].    Your next appointment:   March 9th at 10:20 AM   The format for your next appointment:   In Person  Provider:   Little Ishikawa, MD     Other Instructions Referral to AFIB CLINIC - 3 weeks (they will contact you for an appointment) Referral to HEART VALVE CLINIC (they will contact you for an appointment)

## 2021-04-08 LAB — BASIC METABOLIC PANEL
BUN/Creatinine Ratio: 17 (ref 10–24)
BUN: 25 mg/dL (ref 8–27)
CO2: 23 mmol/L (ref 20–29)
Calcium: 9.5 mg/dL (ref 8.6–10.2)
Chloride: 104 mmol/L (ref 96–106)
Creatinine, Ser: 1.46 mg/dL — ABNORMAL HIGH (ref 0.76–1.27)
Glucose: 107 mg/dL — ABNORMAL HIGH (ref 70–99)
Potassium: 4.1 mmol/L (ref 3.5–5.2)
Sodium: 140 mmol/L (ref 134–144)
eGFR: 54 mL/min/{1.73_m2} — ABNORMAL LOW (ref >59)

## 2021-04-08 LAB — CBC
Hematocrit: 44.7 % (ref 37.5–51.0)
Hemoglobin: 13.1 g/dL (ref 13.0–17.7)
MCH: 22.3 pg — ABNORMAL LOW (ref 26.6–33.0)
MCHC: 29.3 g/dL — ABNORMAL LOW (ref 31.5–35.7)
MCV: 76 fL — ABNORMAL LOW (ref 79–97)
Platelets: 200 10*3/uL (ref 150–450)
RBC: 5.87 x10E6/uL — ABNORMAL HIGH (ref 4.14–5.80)
RDW: 15.9 % — ABNORMAL HIGH (ref 11.6–15.4)
WBC: 4.6 10*3/uL (ref 3.4–10.8)

## 2021-04-08 LAB — MAGNESIUM: Magnesium: 2 mg/dL (ref 1.6–2.3)

## 2021-04-12 ENCOUNTER — Encounter: Payer: Self-pay | Admitting: *Deleted

## 2021-04-28 ENCOUNTER — Ambulatory Visit (HOSPITAL_COMMUNITY): Payer: Medicaid Other | Admitting: Physician Assistant

## 2021-04-30 ENCOUNTER — Telehealth (HOSPITAL_COMMUNITY): Payer: Self-pay | Admitting: Emergency Medicine

## 2021-04-30 NOTE — Telephone Encounter (Signed)
Reaching out to patient to offer assistance regarding upcoming cardiac imaging study; pt verbalizes understanding of appt date/time, parking situation and where to check in, pre-test NPO status and medications ordered, and verified current allergies; name and call back number provided for further questions should they arise Rockwell Alexandria RN Navigator Cardiac Imaging [facility] [phone] office [phone] cell   Denies claustro Denies metal implants Denies iv issues Arrival 1030

## 2021-05-03 ENCOUNTER — Other Ambulatory Visit: Payer: Self-pay

## 2021-05-03 ENCOUNTER — Ambulatory Visit (HOSPITAL_COMMUNITY)
Admission: RE | Admit: 2021-05-03 | Discharge: 2021-05-03 | Disposition: A | Discharge status: Home / Self Care | Payer: Medicaid Other | Source: Ambulatory Visit | Attending: Cardiology | Admitting: Cardiology

## 2021-05-03 DIAGNOSIS — I517 Cardiomegaly: Secondary | ICD-10-CM

## 2021-05-03 MED ORDER — GADOBUTROL 1 MMOL/ML IV SOLN
10.0000 mL | Freq: Once | INTRAVENOUS | Status: AC | PRN
  Administered 2021-05-03: 10 mL via INTRAVENOUS

## 2021-05-05 ENCOUNTER — Ambulatory Visit (HOSPITAL_COMMUNITY)
Admission: RE | Admit: 2021-05-05 | Discharge: 2021-05-05 | Disposition: A | Discharge status: Home / Self Care | Payer: Medicare Other | Source: Ambulatory Visit | Attending: Physician Assistant | Admitting: Physician Assistant

## 2021-05-05 ENCOUNTER — Other Ambulatory Visit: Payer: Self-pay

## 2021-05-05 ENCOUNTER — Encounter (HOSPITAL_COMMUNITY): Payer: Self-pay | Admitting: Physician Assistant

## 2021-05-05 VITALS — BP 162/110 | HR 75 | Ht 69.0 in | Wt 213.6 lb

## 2021-05-05 DIAGNOSIS — Z6831 Body mass index (BMI) 31.0-31.9, adult: Secondary | ICD-10-CM | POA: Diagnosis not present

## 2021-05-05 DIAGNOSIS — E785 Hyperlipidemia, unspecified: Secondary | ICD-10-CM | POA: Insufficient documentation

## 2021-05-05 DIAGNOSIS — E669 Obesity, unspecified: Secondary | ICD-10-CM | POA: Diagnosis not present

## 2021-05-05 DIAGNOSIS — I110 Hypertensive heart disease with heart failure: Secondary | ICD-10-CM | POA: Insufficient documentation

## 2021-05-05 DIAGNOSIS — I350 Nonrheumatic aortic (valve) stenosis: Secondary | ICD-10-CM | POA: Diagnosis not present

## 2021-05-05 DIAGNOSIS — Z7901 Long term (current) use of anticoagulants: Secondary | ICD-10-CM | POA: Insufficient documentation

## 2021-05-05 DIAGNOSIS — D6869 Other thrombophilia: Secondary | ICD-10-CM | POA: Insufficient documentation

## 2021-05-05 DIAGNOSIS — Z79899 Other long term (current) drug therapy: Secondary | ICD-10-CM | POA: Insufficient documentation

## 2021-05-05 DIAGNOSIS — I4819 Other persistent atrial fibrillation: Secondary | ICD-10-CM | POA: Diagnosis present

## 2021-05-05 DIAGNOSIS — I5032 Chronic diastolic (congestive) heart failure: Secondary | ICD-10-CM | POA: Insufficient documentation

## 2021-05-05 DIAGNOSIS — G4733 Obstructive sleep apnea (adult) (pediatric): Secondary | ICD-10-CM | POA: Insufficient documentation
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
## 2021-05-06 ENCOUNTER — Encounter: Payer: Self-pay | Admitting: *Deleted

## 2021-05-06 ENCOUNTER — Encounter: Payer: Self-pay | Admitting: Cardiovascular Disease

## 2021-05-06 ENCOUNTER — Ambulatory Visit (INDEPENDENT_AMBULATORY_CARE_PROVIDER_SITE_OTHER): Payer: Medicare Other | Admitting: Cardiovascular Disease

## 2021-05-06 VITALS — BP 124/70 | HR 73 | Ht 69.0 in | Wt 219.0 lb

## 2021-05-06 DIAGNOSIS — I350 Nonrheumatic aortic (valve) stenosis: Secondary | ICD-10-CM

## 2021-05-06 DIAGNOSIS — Z01818 Encounter for other preprocedural examination: Secondary | ICD-10-CM

## 2021-05-06 DIAGNOSIS — Z01812 Encounter for preprocedural laboratory examination: Secondary | ICD-10-CM

## 2021-05-06 NOTE — Patient Instructions (Signed)
Medication Instructions:  No changes today *If you need a refill on your cardiac medications before your next appointment, please call your pharmacy*   Lab Work: none   Testing/Procedures: Please call to arrange cardiac catheterization.    Follow-Up: Per Structural Heart Valve Team   Dates provided for possible cardiac catheterization.  Please call with a date that works in your schedule and we will arrange the procedure.

## 2021-05-06 NOTE — Progress Notes (Signed)
Patient called back and has requested cath date of 05/27/21. Arranged for 10:30 am.  Pt will return for lab work tomorrow am and pick up instruction letter.  I have also sent to him in MyChart.  Reviewed holding apixaban 2 days prior.

## 2021-05-06 NOTE — Progress Notes (Addendum)
Pre Surgical Assessment: 5 M Walk Test  65M=16.68ft  5 Meter Walk Test- trial 1: 10.35 seconds 5 Meter Walk Test- trial 2: 7.67 seconds 5 Meter Walk Test- trial 3: 6.95 seconds 5 Meter Walk Test Average: 8.32 seconds    STS Risk calculator:   Procedure Type: Isolated AVR Perioperative Outcome Estimate % Operative Mortality 1.89% Morbidity & Mortality 12.5% Stroke 0.726% Renal Failure 4.49% Reoperation 3.5% Prolonged Ventilation 8.17% Deep Sternal Wound Infection 0.12% [place] [facility] Stay (>14 days) 6.82% Short Hospital Stay (<6 days)* 30.5%

## 2021-05-07 ENCOUNTER — Other Ambulatory Visit: Payer: Medicaid Other | Admitting: *Deleted

## 2021-05-07 ENCOUNTER — Other Ambulatory Visit: Payer: Self-pay

## 2021-05-07 DIAGNOSIS — I350 Nonrheumatic aortic (valve) stenosis: Secondary | ICD-10-CM

## 2021-05-07 DIAGNOSIS — Z01812 Encounter for preprocedural laboratory examination: Secondary | ICD-10-CM

## 2021-05-07 LAB — BASIC METABOLIC PANEL
BUN/Creatinine Ratio: 16 (ref 10–24)
BUN: 23 mg/dL (ref 8–27)
CO2: 20 mmol/L (ref 20–29)
Calcium: 9.3 mg/dL (ref 8.6–10.2)
Chloride: 104 mmol/L (ref 96–106)
Creatinine, Ser: 1.44 mg/dL — ABNORMAL HIGH (ref 0.76–1.27)
Glucose: 107 mg/dL — ABNORMAL HIGH (ref 70–99)
Potassium: 4.1 mmol/L (ref 3.5–5.2)
Sodium: 138 mmol/L (ref 134–144)
eGFR: 55 mL/min/{1.73_m2} — ABNORMAL LOW (ref >59)

## 2021-05-07 LAB — CBC
Hematocrit: 39.6 % (ref 37.5–51.0)
Hemoglobin: 12.8 g/dL — ABNORMAL LOW (ref 13.0–17.7)
MCH: 22.4 pg — ABNORMAL LOW (ref 26.6–33.0)
MCHC: 32.3 g/dL (ref 31.5–35.7)
MCV: 69 fL — ABNORMAL LOW (ref 79–97)
Platelets: 280 10*3/uL (ref 150–450)
RBC: 5.72 x10E6/uL (ref 4.14–5.80)
RDW: 15.5 % — ABNORMAL HIGH (ref 11.6–15.4)
WBC: 4.6 10*3/uL (ref 3.4–10.8)
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
## 2021-05-17 ENCOUNTER — Telehealth (INDEPENDENT_AMBULATORY_CARE_PROVIDER_SITE_OTHER): Payer: Medicare Other | Admitting: Cardiology

## 2021-05-17 ENCOUNTER — Other Ambulatory Visit: Payer: Self-pay

## 2021-05-17 ENCOUNTER — Other Ambulatory Visit: Payer: Self-pay | Admitting: Critical Care Medicine

## 2021-05-17 ENCOUNTER — Encounter: Payer: Self-pay | Admitting: Cardiology

## 2021-05-17 VITALS — BP 126/82 | Ht 69.0 in | Wt 213.0 lb

## 2021-05-17 DIAGNOSIS — I10 Essential (primary) hypertension: Secondary | ICD-10-CM | POA: Diagnosis not present

## 2021-05-17 DIAGNOSIS — G4733 Obstructive sleep apnea (adult) (pediatric): Secondary | ICD-10-CM | POA: Diagnosis not present

## 2021-05-17 NOTE — Patient Instructions (Addendum)
Medication Instructions:  Your physician recommends that you continue on your current medications as directed. Please refer to the Current Medication list given to you today.  *If you need a refill on your cardiac medications before your next appointment, please call your pharmacy*  Follow-Up: At [facility], you and your health needs are our priority.  As part of our continuing mission to provide you with exceptional heart care, we have created designated Provider Care Teams.  These Care Teams include your primary Cardiologist (physician) and Advanced Practice Providers (APPs -  Physician Assistants and Nurse Practitioners) who all work together to provide you with the care you need, when you need it.   Your next appointment:   6 week(s)  The format for your next appointment:   In Person  Provider:   Armanda Magic, MD

## 2021-05-17 NOTE — Telephone Encounter (Signed)
Medication Refill - Medication: allopurinol (ZYLOPRIM) 100 MG tablet  Has the patient contacted their pharmacy? Yes.   But it was sent to another pharmacy   Preferred Pharmacy (with phone number or street name): [facility] [id number] - [place], [place]  Has the patient been seen for an appointment in the last year OR does the patient have an upcoming appointment? Yes.   He does now.  Advised pt he needs appt for his med follow ups.  Agent: Please be advised that RX refills may take up to 3 business days. We ask that you follow-up with your pharmacy.

## 2021-05-18 NOTE — Telephone Encounter (Signed)
Requested medication (s) are due for refill today - Rx expired  Requested medication (s) are on the active medication list -yes  Future visit scheduled -yes  Last refill: 02/11/20 #30 6 RF  Notes to clinic: Request RF: expired Rx  Requested Prescriptions  Pending Prescriptions Disp Refills   allopurinol (ZYLOPRIM) 100 MG tablet 30 tablet 6    Sig: TAKE 1 TABLET (100 MG TOTAL) BY MOUTH DAILY.     Endocrinology:  Gout Agents - allopurinol Failed - 05/17/2021  2:50 PM      Failed - Uric Acid in normal range and within 360 days    Uric Acid, Serum  Date Value Ref Range Status  07/19/2015 6.4 4.4 - 7.6 mg/dL Final          Failed - Cr in normal range and within 360 days    Creatinine, Ser  Date Value Ref Range Status  05/07/2021 1.44 (H) 0.76 - 1.27 mg/dL Final          Failed - Valid encounter within last 12 months    Recent Outpatient Visits           1 year ago Chronic diastolic heart failure ([place])   [place] [name], MD   1 year ago Bilateral lower extremity edema, with open wounds   [place] [name], MD   1 year ago Hypertensive urgency   [place] [name], MD   2 years ago Encounter to establish care   [place], Milford Cage, NP   3 years ago Essential hypertension   [place], Charlane Ferretti, MD       Future Appointments             In 3 weeks Donato Heinz, MD [place], [place]   In 1 month Turner, Eber Hong, MD [place], [facility]   In 1 month [name], MD [place]            Failed - CBC within normal limits and completed in the last 12 months    WBC  Date Value Ref Range Status  05/07/2021 4.6 3.4 - 10.8 x10E3/uL Final  02/11/2020 5.0 4.0 - 10.5 K/uL Final   RBC   Date Value Ref Range Status  05/07/2021 5.72 4.14 - 5.80 x10E6/uL Final  02/11/2020 5.22 4.22 - 5.81 MIL/uL Final   Hemoglobin  Date Value Ref Range Status  05/07/2021 12.8 (L) 13.0 - 17.7 g/dL Final   Hematocrit  Date Value Ref Range Status  05/07/2021 39.6 37.5 - 51.0 % Final   MCHC  Date Value Ref Range Status  05/07/2021 32.3 31.5 - 35.7 g/dL Final  02/11/2020 30.1 30.0 - 36.0 g/dL Final   [facility]  Date Value Ref Range Status  05/07/2021 22.4 (L) 26.6 - 33.0 pg Final  02/11/2020 21.8 (L) 26.0 - 34.0 pg Final   MCV  Date Value Ref Range Status  05/07/2021 69 (L) 79 - 97 fL Final   No results found for: PLTCOUNTKUC, LABPLAT, POCPLA RDW  Date Value Ref Range Status  05/07/2021 15.5 (H) 11.6 - 15.4 % Final            Requested Prescriptions  Pending Prescriptions Disp Refills   allopurinol (ZYLOPRIM) 100 MG tablet 30 tablet 6    Sig: TAKE 1  TABLET (100 MG TOTAL) BY MOUTH DAILY.     Endocrinology:  Gout Agents - allopurinol Failed - 05/17/2021  2:50 PM      Failed - Uric Acid in normal range and within 360 days    Uric Acid, Serum  Date Value Ref Range Status  07/19/2015 6.4 4.4 - 7.6 mg/dL Final          Failed - Cr in normal range and within 360 days    Creatinine, Ser  Date Value Ref Range Status  05/07/2021 1.44 (H) 0.76 - 1.27 mg/dL Final          Failed - Valid encounter within last 12 months    Recent Outpatient Visits           1 year ago Chronic diastolic heart failure ([place])   [place] [name], MD   1 year ago Bilateral lower extremity edema, with open wounds   [place] [name], MD   1 year ago Hypertensive urgency   [place] [name], MD   2 years ago Encounter to establish care   [place], Milford Cage, NP   3 years ago Essential hypertension   [place], Charlane Ferretti, MD       Future Appointments             In 3 weeks Donato Heinz, MD [place], [place]   In 1 month Turner, Eber Hong, MD [place], [facility]   In 1 month [name], MD [place]            Failed - CBC within normal limits and completed in the last 12 months    WBC  Date Value Ref Range Status  05/07/2021 4.6 3.4 - 10.8 x10E3/uL Final  02/11/2020 5.0 4.0 - 10.5 K/uL Final   RBC  Date Value Ref Range Status  05/07/2021 5.72 4.14 - 5.80 x10E6/uL Final  02/11/2020 5.22 4.22 - 5.81 MIL/uL Final   Hemoglobin  Date Value Ref Range Status  05/07/2021 12.8 (L) 13.0 - 17.7 g/dL Final   Hematocrit  Date Value Ref Range Status  05/07/2021 39.6 37.5 - 51.0 % Final   MCHC  Date Value Ref Range Status  05/07/2021 32.3 31.5 - 35.7 g/dL Final  02/11/2020 30.1 30.0 - 36.0 g/dL Final   [facility]  Date Value Ref Range Status  05/07/2021 22.4 (L) 26.6 - 33.0 pg Final  02/11/2020 21.8 (L) 26.0 - 34.0 pg Final   MCV  Date Value Ref Range Status  05/07/2021 69 (L) 79 - 97 fL Final   No results found for: PLTCOUNTKUC, LABPLAT, POCPLA RDW  Date Value Ref Range Status  05/07/2021 15.5 (H) 11.6 - 15.4 % Final

## 2021-05-19 ENCOUNTER — Telehealth: Payer: Self-pay | Admitting: *Deleted

## 2021-05-19 DIAGNOSIS — G4733 Obstructive sleep apnea (adult) (pediatric): Secondary | ICD-10-CM

## 2021-05-19 NOTE — Telephone Encounter (Signed)
Order placed to [facility] via fax °

## 2021-05-19 NOTE — Telephone Encounter (Signed)
-----   Message from Theresia Majors, RN sent at 05/17/2021 10:47 AM EST ----- Per Dr. Mayford Knife: change him to auto CPAP from 4 to 20 cm H2O. Please order supplies to change out cushion every 4-6 weeks and need filters Thanks!

## 2021-05-21 ENCOUNTER — Other Ambulatory Visit: Payer: Self-pay | Admitting: Cardiology

## 2021-05-21 ENCOUNTER — Other Ambulatory Visit: Payer: Self-pay | Admitting: *Deleted

## 2021-05-21 MED ORDER — FUROSEMIDE 40 MG PO TABS: 40.0000 mg | ORAL_TABLET | Freq: Two times a day (BID) | ORAL | 3 refills | Status: DC

## 2021-05-21 MED ORDER — ATORVASTATIN CALCIUM 20 MG PO TABS: 20.0000 mg | ORAL_TABLET | Freq: Every day | ORAL | 3 refills | Status: DC

## 2021-05-21 MED ORDER — CLONIDINE HCL 0.2 MG PO TABS: 0.1000 mg | ORAL_TABLET | Freq: Three times a day (TID) | ORAL | 3 refills | Status: DC

## 2021-05-25 ENCOUNTER — Telehealth: Payer: Self-pay | Admitting: Cardiovascular Disease

## 2021-05-25 NOTE — Telephone Encounter (Signed)
Reviewed cath instructions with the patient.  All questions/concerns addressed.

## 2021-05-25 NOTE — Telephone Encounter (Signed)
Patient is calling stating that he misplaced his procedure instructions and is unsure if he is able to access the instructions given via mychart for his upcoming procedure on 02/23. Patient states he has began holding Eliquis as of day. Is requesting a callback to go over instructions.

## 2021-05-26 ENCOUNTER — Telehealth: Payer: Self-pay | Admitting: *Deleted

## 2021-05-26 ENCOUNTER — Encounter: Payer: Self-pay | Admitting: *Deleted

## 2021-05-26 NOTE — Telephone Encounter (Addendum)
Cardiac catheterization scheduled at [facility] for: Thursday May 27, 2021 10:30 AM [facility] Main Entrance A [facility]) at: 8:30 AM   Diet-no solid food after midnight prior to cath, clear liquids until 5 AM day of procedure.  Medication instructions for procedure: -Hold:  Eliquis-none 05/25/21 until post procedure  Lasix/KCl-PM prior/AM of procedure-per protocol GFR 55  Olmesartan-day before and day of procedure-per protocol GFR 55 -Usual morning medications can be takendure. pre-cath with sips of water including aspirin 81 mg.    Must have responsible adult to drive home post procedure and be with patient first 24 hours after arriving home.  [facility] does allow one visitor to wait in the waiting room during the time you are there.   Reviewed procedure instructions with patient.

## 2021-05-26 NOTE — Telephone Encounter (Signed)
p 

## 2021-05-27 ENCOUNTER — Encounter (HOSPITAL_COMMUNITY): Payer: Self-pay | Admitting: Cardiovascular Disease

## 2021-05-27 ENCOUNTER — Ambulatory Visit (HOSPITAL_COMMUNITY)
Admission: RE | Disposition: A | Discharge status: Home / Self Care | Payer: Medicaid Other | Source: Home / Self Care | Attending: Cardiovascular Disease

## 2021-05-27 ENCOUNTER — Ambulatory Visit (HOSPITAL_COMMUNITY)
Admission: RE | Admit: 2021-05-27 | Discharge: 2021-05-27 | Disposition: A | Discharge status: Home / Self Care | Payer: Medicare Other | Attending: Cardiovascular Disease | Admitting: Cardiovascular Disease

## 2021-05-27 ENCOUNTER — Other Ambulatory Visit: Payer: Self-pay

## 2021-05-27 DIAGNOSIS — Z7901 Long term (current) use of anticoagulants: Secondary | ICD-10-CM | POA: Diagnosis not present

## 2021-05-27 DIAGNOSIS — I4891 Unspecified atrial fibrillation: Secondary | ICD-10-CM | POA: Diagnosis not present

## 2021-05-27 DIAGNOSIS — I350 Nonrheumatic aortic (valve) stenosis: Secondary | ICD-10-CM | POA: Diagnosis not present

## 2021-05-27 DIAGNOSIS — M109 Gout, unspecified: Secondary | ICD-10-CM | POA: Diagnosis not present

## 2021-05-27 DIAGNOSIS — I110 Hypertensive heart disease with heart failure: Secondary | ICD-10-CM | POA: Diagnosis not present

## 2021-05-27 DIAGNOSIS — I2510 Atherosclerotic heart disease of native coronary artery without angina pectoris: Secondary | ICD-10-CM | POA: Insufficient documentation

## 2021-05-27 DIAGNOSIS — G4730 Sleep apnea, unspecified: Secondary | ICD-10-CM | POA: Diagnosis not present

## 2021-05-27 DIAGNOSIS — I5032 Chronic diastolic (congestive) heart failure: Secondary | ICD-10-CM | POA: Diagnosis not present

## 2021-05-27 DIAGNOSIS — K219 Gastro-esophageal reflux disease without esophagitis: Secondary | ICD-10-CM | POA: Insufficient documentation

## 2021-05-27 HISTORY — PX: RIGHT/LEFT HEART CATH AND CORONARY ANGIOGRAPHY: CATH118266

## 2021-05-27 LAB — POCT I-STAT 7, (LYTES, BLD GAS, ICA,H+H)
Acid-base deficit: 1 mmol/L (ref 0.0–2.0)
Bicarbonate: 24.5 mmol/L (ref 20.0–28.0)
Calcium, Ion: 1.26 mmol/L (ref 1.15–1.40)
HCT: 36 % — ABNORMAL LOW (ref 39.0–52.0)
Hemoglobin: 12.2 g/dL — ABNORMAL LOW (ref 13.0–17.0)
O2 Saturation: 99 %
Potassium: 3.8 mmol/L (ref 3.5–5.1)
Sodium: 143 mmol/L (ref 135–145)
TCO2: 26 mmol/L (ref 22–32)
pCO2 arterial: 43 mmHg (ref 32–48)
pH, Arterial: 7.364 (ref 7.35–7.45)
pO2, Arterial: 142 mmHg — ABNORMAL HIGH (ref 83–108)

## 2021-05-27 LAB — POCT I-STAT EG7
Acid-Base Excess: 0 mmol/L (ref 0.0–2.0)
Bicarbonate: 26.2 mmol/L (ref 20.0–28.0)
Calcium, Ion: 1.26 mmol/L (ref 1.15–1.40)
HCT: 36 % — ABNORMAL LOW (ref 39.0–52.0)
Hemoglobin: 12.2 g/dL — ABNORMAL LOW (ref 13.0–17.0)
O2 Saturation: 76 %
Potassium: 3.8 mmol/L (ref 3.5–5.1)
Sodium: 144 mmol/L (ref 135–145)
TCO2: 28 mmol/L (ref 22–32)
pCO2, Ven: 46.8 mmHg (ref 44–60)
pH, Ven: 7.356 (ref 7.25–7.43)
pO2, Ven: 43 mmHg (ref 32–45)

## 2021-05-27 SURGERY — RIGHT/LEFT HEART CATH AND CORONARY ANGIOGRAPHY
Anesthesia: LOCAL

## 2021-05-27 MED ORDER — SODIUM CHLORIDE 0.9 % IV SOLN: 250.0000 mL | INTRAVENOUS | Status: DC | PRN

## 2021-05-27 MED ORDER — HEPARIN SODIUM (PORCINE) 1000 UNIT/ML IJ SOLN
INTRAMUSCULAR | Status: DC | PRN
  Administered 2021-05-27: 5000 [IU] via INTRAVENOUS

## 2021-05-27 MED ORDER — HEPARIN (PORCINE) IN NACL 1000-0.9 UT/500ML-% IV SOLN
INTRAVENOUS | Status: AC
Start: 2021-05-27 — End: ?
  Filled 2021-05-27: qty 1000

## 2021-05-27 MED ORDER — VERAPAMIL HCL 2.5 MG/ML IV SOLN
INTRAVENOUS | Status: AC
  Filled 2021-05-27: qty 2

## 2021-05-27 MED ORDER — MIDAZOLAM HCL 2 MG/2ML IJ SOLN
INTRAMUSCULAR | Status: DC | PRN
  Administered 2021-05-27: 2 mg via INTRAVENOUS

## 2021-05-27 MED ORDER — FENTANYL CITRATE (PF) 100 MCG/2ML IJ SOLN
INTRAMUSCULAR | Status: AC
  Filled 2021-05-27: qty 2

## 2021-05-27 MED ORDER — ASPIRIN 81 MG PO CHEW: 81.0000 mg | CHEWABLE_TABLET | ORAL | Status: DC

## 2021-05-27 MED ORDER — FENTANYL CITRATE (PF) 100 MCG/2ML IJ SOLN
INTRAMUSCULAR | Status: DC | PRN
  Administered 2021-05-27: 50 ug via INTRAVENOUS

## 2021-05-27 MED ORDER — HEPARIN (PORCINE) IN NACL 1000-0.9 UT/500ML-% IV SOLN
INTRAVENOUS | Status: DC | PRN
  Administered 2021-05-27 (×2): 500 mL

## 2021-05-27 MED ORDER — LIDOCAINE HCL (PF) 1 % IJ SOLN
INTRAMUSCULAR | Status: AC
  Filled 2021-05-27: qty 30

## 2021-05-27 MED ORDER — VERAPAMIL HCL 2.5 MG/ML IV SOLN: INTRAVENOUS | Status: DC | PRN

## 2021-05-27 MED ORDER — SODIUM CHLORIDE 0.9 % WEIGHT BASED INFUSION: 1.0000 mL/kg/h | INTRAVENOUS | Status: DC

## 2021-05-27 MED ORDER — SODIUM CHLORIDE 0.9 % WEIGHT BASED INFUSION
3.0000 mL/kg/h | INTRAVENOUS | Status: AC
  Administered 2021-05-27: 3 mL/kg/h via INTRAVENOUS

## 2021-05-27 MED ORDER — HEPARIN SODIUM (PORCINE) 1000 UNIT/ML IJ SOLN
INTRAMUSCULAR | Status: AC
  Filled 2021-05-27: qty 10

## 2021-05-27 MED ORDER — MIDAZOLAM HCL 2 MG/2ML IJ SOLN
INTRAMUSCULAR | Status: AC
  Filled 2021-05-27: qty 2

## 2021-05-27 MED ORDER — SODIUM CHLORIDE 0.9% FLUSH: 3.0000 mL | INTRAVENOUS | Status: DC | PRN

## 2021-05-27 MED ORDER — SODIUM CHLORIDE 0.9% FLUSH: 3.0000 mL | Freq: Two times a day (BID) | INTRAVENOUS | Status: DC

## 2021-05-27 MED ORDER — IOHEXOL 350 MG/ML SOLN
INTRAVENOUS | Status: DC | PRN
  Administered 2021-05-27: 50 mL

## 2021-05-27 SURGICAL SUPPLY — 12 items

## 2021-05-27 NOTE — Interval H&P Note (Signed)
History and Physical Interval Note:  05/27/2021 8:56 AM  [name]  has presented today for surgery, with the diagnosis of severe aortic stenosis.  The various methods of treatment have been discussed with the patient and family. After consideration of risks, benefits and other options for treatment, the patient has consented to  Procedure(s): RIGHT/LEFT HEART CATH AND CORONARY ANGIOGRAPHY (N/A) as a surgical intervention.  The patient's history has been reviewed, patient examined, no change in status, stable for surgery.  I have reviewed the patient's chart and labs.  Questions were answered to the patient's satisfaction.    Cath Lab Visit (complete for each Cath Lab visit)  Clinical Evaluation Leading to the Procedure:   ACS: No.  Non-ACS:    Anginal Classification: CCS II  Anti-ischemic medical therapy: Maximal Therapy (2 or more classes of medications)  Non-Invasive Test Results: No non-invasive testing performed  Prior CABG: No previous CABG        Verne Carrow

## 2021-05-27 NOTE — Discharge Instructions (Signed)
Resume Eliquis tomorrow if no bleeding from right arm cath sites ?

## 2021-05-28 MED FILL — Lidocaine HCl Local Preservative Free (PF) Inj 1%: INTRAMUSCULAR | Qty: 30 | Status: AC
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
## 2021-06-07 ENCOUNTER — Encounter: Payer: Medicaid Other | Admitting: Surgery

## 2021-06-10 ENCOUNTER — Ambulatory Visit (INDEPENDENT_AMBULATORY_CARE_PROVIDER_SITE_OTHER): Payer: Medicare Other | Admitting: Cardiology

## 2021-06-10 ENCOUNTER — Encounter: Payer: Medicaid Other | Admitting: Surgery

## 2021-06-10 ENCOUNTER — Encounter: Payer: Self-pay | Admitting: Cardiology

## 2021-06-10 ENCOUNTER — Other Ambulatory Visit: Payer: Self-pay

## 2021-06-10 VITALS — BP 116/80 | HR 53 | Ht 69.0 in | Wt 216.8 lb

## 2021-06-10 DIAGNOSIS — I5032 Chronic diastolic (congestive) heart failure: Secondary | ICD-10-CM | POA: Diagnosis not present

## 2021-06-10 DIAGNOSIS — I10 Essential (primary) hypertension: Secondary | ICD-10-CM | POA: Diagnosis not present

## 2021-06-10 DIAGNOSIS — I4819 Other persistent atrial fibrillation: Secondary | ICD-10-CM

## 2021-06-10 DIAGNOSIS — I350 Nonrheumatic aortic (valve) stenosis: Secondary | ICD-10-CM

## 2021-06-10 DIAGNOSIS — E785 Hyperlipidemia, unspecified: Secondary | ICD-10-CM

## 2021-06-10 LAB — LIPID PANEL
Chol/HDL Ratio: 4.7 ratio (ref 0.0–5.0)
Cholesterol, Total: 104 mg/dL (ref 100–199)
HDL: 22 mg/dL — ABNORMAL LOW (ref >39)
LDL Chol Calc (NIH): 61 mg/dL (ref 0–99)
Triglycerides: 114 mg/dL (ref 0–149)
VLDL Cholesterol Cal: 21 mg/dL (ref 5–40)

## 2021-06-10 MED ORDER — OLMESARTAN MEDOXOMIL 20 MG PO TABS: 20.0000 mg | ORAL_TABLET | Freq: Every day | ORAL | 3 refills | Status: DC

## 2021-06-10 MED ORDER — ATORVASTATIN CALCIUM 20 MG PO TABS: 20.0000 mg | ORAL_TABLET | Freq: Every day | ORAL | 3 refills | Status: DC

## 2021-06-10 MED ORDER — FUROSEMIDE 40 MG PO TABS: 40.0000 mg | ORAL_TABLET | Freq: Two times a day (BID) | ORAL | 3 refills | Status: DC

## 2021-06-10 MED ORDER — APIXABAN 5 MG PO TABS: 5.0000 mg | ORAL_TABLET | Freq: Two times a day (BID) | ORAL | 3 refills | Status: DC

## 2021-06-10 MED ORDER — AMLODIPINE BESYLATE 10 MG PO TABS: 10.0000 mg | ORAL_TABLET | Freq: Every day | ORAL | 3 refills | Status: DC

## 2021-06-10 MED ORDER — HYDRALAZINE HCL 50 MG PO TABS: 50.0000 mg | ORAL_TABLET | Freq: Three times a day (TID) | ORAL | 3 refills | Status: DC

## 2021-06-10 MED ORDER — CLONIDINE HCL 0.2 MG PO TABS: 0.1000 mg | ORAL_TABLET | Freq: Three times a day (TID) | ORAL | 3 refills | Status: DC

## 2021-06-10 NOTE — Patient Instructions (Addendum)
Medication Instructions:  ?STOP aspirin ? ?*If you need a refill on your cardiac medications before your next appointment, please call your pharmacy* ? ? ?Lab Work: ?BMET, Mag, Lipid today ? ?If you have labs (blood work) drawn today and your tests are completely normal, you will receive your results only by: ?MyChart Message (if you have MyChart) OR ?A paper copy in the mail ?If you have any lab test that is abnormal or we need to change your treatment, we will call you to review the results. ? ?Follow-Up: ?At [facility], you and your health needs are our priority.  As part of our continuing mission to provide you with exceptional heart care, we have created designated Provider Care Teams.  These Care Teams include your primary Cardiologist (physician) and Advanced Practice Providers (APPs -  Physician Assistants and Nurse Practitioners) who all work together to provide you with the care you need, when you need it. ? ?We recommend signing up for the patient portal called "MyChart".  Sign up information is provided on this After Visit Summary.  MyChart is used to connect with patients for Virtual Visits (Telemedicine).  Patients are able to view lab/test results, encounter notes, upcoming appointments, etc.  Non-urgent messages can be sent to your provider as well.   ?To learn more about what you can do with MyChart, go to [email or web address].   ? ?Your next appointment:   ?3 month(s) ? ?The format for your next appointment:   ?In Person ? ?Provider:   ?Little Ishikawa, MD   ? ? ?

## 2021-06-11 ENCOUNTER — Encounter: Payer: Medicaid Other | Admitting: Surgery

## 2021-06-11 ENCOUNTER — Institutional Professional Consult (permissible substitution) (INDEPENDENT_AMBULATORY_CARE_PROVIDER_SITE_OTHER): Payer: Medicare Other | Admitting: Surgery

## 2021-06-11 ENCOUNTER — Encounter: Payer: Self-pay | Admitting: Surgery

## 2021-06-11 VITALS — BP 135/86 | HR 80 | Resp 20 | Ht 69.0 in | Wt 216.0 lb

## 2021-06-11 DIAGNOSIS — I4891 Unspecified atrial fibrillation: Secondary | ICD-10-CM

## 2021-06-11 DIAGNOSIS — I350 Nonrheumatic aortic (valve) stenosis: Secondary | ICD-10-CM

## 2021-06-11 DIAGNOSIS — I2510 Atherosclerotic heart disease of native coronary artery without angina pectoris: Secondary | ICD-10-CM
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
## 2021-06-14 ENCOUNTER — Encounter: Payer: Self-pay | Admitting: *Deleted

## 2021-06-14 ENCOUNTER — Other Ambulatory Visit: Payer: Self-pay | Admitting: *Deleted

## 2021-06-14 DIAGNOSIS — I4891 Unspecified atrial fibrillation: Secondary | ICD-10-CM

## 2021-06-14 DIAGNOSIS — I350 Nonrheumatic aortic (valve) stenosis: Secondary | ICD-10-CM

## 2021-06-14 DIAGNOSIS — R739 Hyperglycemia, unspecified: Secondary | ICD-10-CM

## 2021-06-15 ENCOUNTER — Other Ambulatory Visit: Payer: Self-pay

## 2021-06-15 MED ORDER — CLONIDINE HCL 0.2 MG PO TABS: 0.1000 mg | ORAL_TABLET | Freq: Three times a day (TID) | ORAL | 3 refills | Status: DC

## 2021-06-15 MED ORDER — AMLODIPINE BESYLATE 10 MG PO TABS: 10.0000 mg | ORAL_TABLET | Freq: Every day | ORAL | 3 refills | Status: DC

## 2021-06-15 MED ORDER — ATORVASTATIN CALCIUM 20 MG PO TABS: 20.0000 mg | ORAL_TABLET | Freq: Every day | ORAL | 3 refills | Status: DC

## 2021-06-15 MED ORDER — FUROSEMIDE 40 MG PO TABS: 40.0000 mg | ORAL_TABLET | Freq: Two times a day (BID) | ORAL | 3 refills | Status: DC

## 2021-06-15 MED ORDER — CARVEDILOL 25 MG PO TABS: 25.0000 mg | ORAL_TABLET | Freq: Two times a day (BID) | ORAL | 3 refills | Status: DC

## 2021-06-15 MED ORDER — OLMESARTAN MEDOXOMIL 20 MG PO TABS
20.0000 mg | ORAL_TABLET | Freq: Every day | ORAL | 3 refills | Status: DC
Start: 2021-06-15 — End: 2021-06-17

## 2021-06-15 MED ORDER — HYDRALAZINE HCL 50 MG PO TABS: 50.0000 mg | ORAL_TABLET | Freq: Three times a day (TID) | ORAL | 3 refills | Status: DC

## 2021-06-16 ENCOUNTER — Ambulatory Visit: Payer: Self-pay

## 2021-06-16 NOTE — Telephone Encounter (Signed)
?  Chief Complaint: Leg swelling ?Symptoms: Leg swelling and weeping, numbness ?Frequency: Since last weds ?Pertinent Negatives: Patient denies Fever ?Disposition: [] ED /[x] Urgent Care (no appt availability in office) / [] Appointment(In office/virtual)/ []  [facility]/ [] Home Care/ [] Refused Recommended Disposition /[] [facility]/ []  Follow-up with PCP ?Additional Notes: Pt does not wish to go to UC or [facility]. Pt would like return call from Dr. Joya Gaskins. Pt is scheduled for surgery 07/05/2021 and is out of all of his medication. Pt has had cellulitis previously and feels this is happening again. ? ? ? ? ?Reason for Disposition ? SEVERE leg swelling (e.g., swelling extends above knee, entire leg is swollen, weeping fluid) ? ?Answer Assessment - Initial Assessment Questions ?1. ONSET: "When did the swelling start?" (e.g., minutes, hours, days) ?    Weds last week ?2. LOCATION: "What part of the leg is swollen?"  "Are both legs swollen or just one leg?" ?    Right leg ?3. SEVERITY: "How bad is the swelling?" (e.g., localized; mild, moderate, severe) ? - Localized - small area of swelling localized to one leg ? - MILD pedal edema - swelling limited to foot and ankle, pitting edema < 1/4 inch (6 mm) deep, rest and elevation eliminate most or all swelling ? - MODERATE edema - swelling of lower leg to knee, pitting edema > 1/4 inch (6 mm) deep, rest and elevation only partially reduce swelling ? - SEVERE edema - swelling extends above knee, facial or hand swelling present  ?    severe ?4. REDNESS: "Does the swelling look red or infected?" ?    No  - leaking fluid ?5. PAIN: "Is the swelling painful to touch?" If Yes, ask: "How painful is it?"   (Scale 1-10; mild, moderate or severe) ?    yes ?6. FEVER: "Do you have a fever?" If Yes, ask: "What is it, how was it measured, and when did it start?"  ?    no ?7. CAUSE: "What do you think is causing the leg swelling?" ?    cellulitis ?8. MEDICAL HISTORY: "Do  you have a history of heart failure, kidney disease, liver failure, or cancer?" ?    Heart failure ?9. RECURRENT SYMPTOM: "Have you had leg swelling before?" If Yes, ask: "When was the last time?" "What happened that time?" ?    yes ?10. OTHER SYMPTOMS: "Do you have any other symptoms?" (e.g., chest pain, difficulty breathing) ?      no ?11. PREGNANCY: "Is there any chance you are pregnant?" "When was your last menstrual period?" ?      na ? ?Protocols used: Leg Swelling and Edema-A-AH ? ?

## 2021-06-17 MED ORDER — CLONIDINE HCL 0.2 MG PO TABS: 0.1000 mg | ORAL_TABLET | Freq: Three times a day (TID) | ORAL | 3 refills | Status: DC

## 2021-06-17 MED ORDER — CARVEDILOL 25 MG PO TABS: 25.0000 mg | ORAL_TABLET | Freq: Two times a day (BID) | ORAL | 3 refills | Status: DC

## 2021-06-17 MED ORDER — AMLODIPINE BESYLATE 10 MG PO TABS: 10.0000 mg | ORAL_TABLET | Freq: Every day | ORAL | 3 refills | Status: DC

## 2021-06-17 MED ORDER — OLMESARTAN MEDOXOMIL 20 MG PO TABS: 20.0000 mg | ORAL_TABLET | Freq: Every day | ORAL | 3 refills | Status: DC

## 2021-06-17 MED ORDER — HYDRALAZINE HCL 50 MG PO TABS: 50.0000 mg | ORAL_TABLET | Freq: Three times a day (TID) | ORAL | 3 refills | Status: DC

## 2021-06-17 MED ORDER — APIXABAN 5 MG PO TABS: 5.0000 mg | ORAL_TABLET | Freq: Two times a day (BID) | ORAL | 3 refills | Status: DC

## 2021-06-17 MED ORDER — ALLOPURINOL 100 MG PO TABS: ORAL_TABLET | Freq: Every day | ORAL | 6 refills | Status: DC

## 2021-06-17 MED ORDER — POTASSIUM CHLORIDE ER 20 MEQ PO TBCR: 20.0000 meq | EXTENDED_RELEASE_TABLET | Freq: Two times a day (BID) | ORAL | 3 refills | Status: DC

## 2021-06-17 MED ORDER — FUROSEMIDE 40 MG PO TABS: 40.0000 mg | ORAL_TABLET | Freq: Two times a day (BID) | ORAL | 3 refills | Status: DC

## 2021-06-17 MED ORDER — ATORVASTATIN CALCIUM 20 MG PO TABS: 20.0000 mg | ORAL_TABLET | Freq: Every day | ORAL | 3 refills | Status: DC

## 2021-06-17 NOTE — Telephone Encounter (Signed)
I spoke to this patient and he needs refills on all his medications these were sent to the mail order pharmacy by cardiology however the patient wishes to get these locally I sent them over to the [facility] he knows this ? ?The patient states he cannot keep his appoint with me March 30 and he is having open heart surgery April 3 ? ?The issue is he is out of all his medications this is why he is swelling ? ?There is no evidence of cellulitis ? ?The patient did not want to come in for a visit ? ?Please rebook this patient with me after he gets out of the hospital I would say towards the end of April will be sufficient ?  ?

## 2021-06-17 NOTE — Addendum Note (Signed)
Addended by: [name] E on: 06/17/2021 11:55 AM ? ? Modules accepted: Orders ? ?

## 2021-06-17 NOTE — Telephone Encounter (Signed)
Called pt and his appt was rescheduled  ?

## 2021-06-18 ENCOUNTER — Emergency Department (HOSPITAL_BASED_OUTPATIENT_CLINIC_OR_DEPARTMENT_OTHER)
Admit: 2021-06-18 | Discharge: 2021-06-18 | Disposition: A | Discharge status: Home / Self Care | Payer: Medicare Other | Attending: Emergency Medicine | Admitting: Emergency Medicine

## 2021-06-18 ENCOUNTER — Other Ambulatory Visit: Payer: Self-pay

## 2021-06-18 ENCOUNTER — Emergency Department (HOSPITAL_COMMUNITY): Payer: Medicare Other

## 2021-06-18 ENCOUNTER — Emergency Department (HOSPITAL_COMMUNITY)
Admission: EM | Admit: 2021-06-18 | Discharge: 2021-06-18 | Disposition: A | Discharge status: Home / Self Care | Payer: Medicare Other | Attending: Emergency Medicine | Admitting: Emergency Medicine

## 2021-06-18 ENCOUNTER — Encounter (HOSPITAL_COMMUNITY): Payer: Self-pay

## 2021-06-18 ENCOUNTER — Encounter: Payer: Self-pay | Admitting: *Deleted

## 2021-06-18 DIAGNOSIS — R600 Localized edema: Secondary | ICD-10-CM | POA: Diagnosis not present

## 2021-06-18 DIAGNOSIS — Z91040 Latex allergy status: Secondary | ICD-10-CM | POA: Insufficient documentation

## 2021-06-18 DIAGNOSIS — M7989 Other specified soft tissue disorders: Secondary | ICD-10-CM

## 2021-06-18 DIAGNOSIS — R609 Edema, unspecified: Secondary | ICD-10-CM

## 2021-06-18 DIAGNOSIS — M79604 Pain in right leg: Secondary | ICD-10-CM

## 2021-06-18 DIAGNOSIS — Z79899 Other long term (current) drug therapy: Secondary | ICD-10-CM | POA: Diagnosis not present

## 2021-06-18 DIAGNOSIS — I10 Essential (primary) hypertension: Secondary | ICD-10-CM | POA: Diagnosis not present

## 2021-06-18 DIAGNOSIS — R011 Cardiac murmur, unspecified: Secondary | ICD-10-CM | POA: Insufficient documentation

## 2021-06-18 LAB — COMPREHENSIVE METABOLIC PANEL
ALT: 16 U/L (ref 0–44)
AST: 19 U/L (ref 15–41)
Albumin: 3.3 g/dL — ABNORMAL LOW (ref 3.5–5.0)
Alkaline Phosphatase: 74 U/L (ref 38–126)
Anion gap: 8 (ref 5–15)
BUN: 20 mg/dL (ref 8–23)
CO2: 26 mmol/L (ref 22–32)
Calcium: 9.4 mg/dL (ref 8.9–10.3)
Chloride: 106 mmol/L (ref 98–111)
Creatinine, Ser: 1.34 mg/dL — ABNORMAL HIGH (ref 0.61–1.24)
GFR, Estimated: >60 mL/min (ref >60)
Glucose, Bld: 98 mg/dL (ref 70–99)
Potassium: 4.1 mmol/L (ref 3.5–5.1)
Sodium: 140 mmol/L (ref 135–145)
Total Bilirubin: 0.6 mg/dL (ref 0.3–1.2)
Total Protein: 7.9 g/dL (ref 6.5–8.1)

## 2021-06-18 LAB — CBC WITH DIFFERENTIAL/PLATELET
Abs Immature Granulocytes: 0.03 10*3/uL (ref 0.00–0.07)
Basophils Absolute: 0 10*3/uL (ref 0.0–0.1)
Basophils Relative: 1 %
Eosinophils Absolute: 0.1 10*3/uL (ref 0.0–0.5)
Eosinophils Relative: 2 %
HCT: 33.9 % — ABNORMAL LOW (ref 39.0–52.0)
Hemoglobin: 10.9 g/dL — ABNORMAL LOW (ref 13.0–17.0)
Immature Granulocytes: 1 %
Lymphocytes Relative: 34 %
Lymphs Abs: 1.4 10*3/uL (ref 0.7–4.0)
MCH: 22.7 pg — ABNORMAL LOW (ref 26.0–34.0)
MCHC: 32.2 g/dL (ref 30.0–36.0)
MCV: 70.5 fL — ABNORMAL LOW (ref 80.0–100.0)
Monocytes Absolute: 0.5 10*3/uL (ref 0.1–1.0)
Monocytes Relative: 13 %
Neutro Abs: 2 10*3/uL (ref 1.7–7.7)
Neutrophils Relative %: 49 %
Platelets: 237 10*3/uL (ref 150–400)
RBC: 4.81 MIL/uL (ref 4.22–5.81)
RDW: 16.3 % — ABNORMAL HIGH (ref 11.5–15.5)
WBC: 4.1 10*3/uL (ref 4.0–10.5)
nRBC: 0 % (ref 0.0–0.2)

## 2021-06-18 LAB — TROPONIN I (HIGH SENSITIVITY)
Troponin I (High Sensitivity): 22 ng/L — ABNORMAL HIGH (ref <18)
Troponin I (High Sensitivity): 22 ng/L — ABNORMAL HIGH (ref <18)

## 2021-06-18 MED ORDER — ACETAMINOPHEN 500 MG PO TABS
1000.0000 mg | ORAL_TABLET | Freq: Once | ORAL | Status: DC
  Filled 2021-06-18: qty 2

## 2021-06-18 NOTE — ED Triage Notes (Addendum)
Pt reports right leg swelling with weeping that he noticed on Monday. Reports he had a heart cath two weeks ago. Denies CP/SOB. Reports he is scheduled to have a defibrillator placed on April 3. ?

## 2021-06-18 NOTE — ED Provider Notes (Signed)
?[facility] ?Provider Note ? ? ?CSN: [id number] ?Arrival date & time: 06/18/21  1539 ? ?  ? ?History ? ?Chief Complaint  ?Patient presents with  ? Leg Swelling  ? ? ?[name] is a 61 y.o. male. ? ?HPI ? ?This patient is a 61 year old male, he has a history of lower extremity swelling which seems to be chronic.  In fact the patient is known to have congestive heart failure with an ejection fraction of 70 to 75%, he has severe aortic stenosis, the swelling in his legs is quite chronic, he sees Dr. Joya Gaskins, he currently takes amlodipine as well as Lasix, currently taking 40 mg twice daily.  He is on Benicar, hydralazine, clonidine, carvedilol and Eliquis. ? ?He reports that he has chronic swelling of his bilateral lower extremities, this seems to have gotten a little bit worse recently right greater than left.  He has been weeping with clear liquid from his right leg, he has not been wearing his compression stockings for some time and then recently started wearing them again because his leg started to get more swollen.  He states he is intermittently short of breath but not at this time. ? ?On arrival a DVT study was ordered ?Home Medications ?Prior to Admission medications   ?Medication Sig Start Date End Date Taking? Authorizing Provider  ?acetaminophen (TYLENOL) 500 MG tablet Take 500-1,000 mg by mouth every 6 (six) hours as needed (pain.).    [provider]  ?allopurinol (ZYLOPRIM) 100 MG tablet TAKE 1 TABLET (100 MG TOTAL) BY MOUTH DAILY. 06/17/21 06/17/22  [name], MD  ?amLODipine (NORVASC) 10 MG tablet Take 1 tablet (10 mg total) by mouth daily. 06/17/21 06/12/22  [name], MD  ?apixaban (ELIQUIS) 5 MG TABS tablet Take 1 tablet (5 mg total) by mouth 2 (two) times daily. 06/17/21   [name], MD  ?atorvastatin (LIPITOR) 20 MG tablet Take 1 tablet (20 mg total) by mouth daily. 06/17/21   [name], MD  ?carvedilol (COREG) 25 MG tablet  Take 1 tablet (25 mg total) by mouth 2 (two) times daily with a meal. 06/17/21 09/15/21  [name], MD  ?cloNIDine (CATAPRES) 0.2 MG tablet Take 0.5 tablets (0.1 mg total) by mouth 3 (three) times daily. 06/17/21   [name], MD  ?furosemide (LASIX) 40 MG tablet Take 1 tablet (40 mg total) by mouth 2 (two) times daily. 06/17/21   [name], MD  ?hydrALAZINE (APRESOLINE) 50 MG tablet Take 1 tablet (50 mg total) by mouth 3 (three) times daily. 06/17/21   [name], MD  ?olmesartan (BENICAR) 20 MG tablet Take 1 tablet (20 mg total) by mouth daily. 06/17/21   [name], MD  ?Potassium Chloride ER 20 MEQ TBCR Take 20 mEq by mouth 2 (two) times daily. Take with Lasix (furosemide) 06/17/21   [name], MD  ?   ? ?Allergies    ?Adhesive [tape] and Latex   ? ?Review of Systems   ?Review of Systems  ?All other systems reviewed and are negative. ? ?Physical Exam ?Updated Vital Signs ?BP (!) 161/115   Pulse 80   Temp 98.5 ?F (36.9 ?C) (Oral)   Resp (!) 21   Ht 1.753 m (5\' 9" )   Wt 97.5 kg   SpO2 98%   BMI 31.75 kg/m?  ?Physical Exam ?Vitals and nursing note reviewed.  ?Constitutional:   ?   General: He is not in  acute distress. ?   Appearance: He is well-developed.  ?HENT:  ?   Head: Normocephalic and atraumatic.  ?   Mouth/Throat:  ?   Pharynx: No oropharyngeal exudate.  ?Eyes:  ?   General: No scleral icterus.    ?   Right eye: No discharge.     ?   Left eye: No discharge.  ?   Conjunctiva/sclera: Conjunctivae normal.  ?   Pupils: Pupils are equal, round, and reactive to light.  ?Neck:  ?   Thyroid: No thyromegaly.  ?   Vascular: No JVD.  ?Cardiovascular:  ?   Rate and Rhythm: Normal rate and regular rhythm.  ?   Heart sounds: Murmur heard.  ?  No friction rub. No gallop.  ?Pulmonary:  ?   Effort: Pulmonary effort is normal. No respiratory distress.  ?   Breath sounds: Normal breath sounds. No wheezing or rales.  ?Abdominal:  ?   General: Bowel sounds are normal. There is no  distension.  ?   Palpations: Abdomen is soft. There is no mass.  ?   Tenderness: There is no abdominal tenderness.  ?Musculoskeletal:     ?   General: No tenderness. Normal range of motion.  ?   Cervical back: Normal range of motion and neck supple.  ?   Right lower leg: Edema present.  ?   Left lower leg: Edema present.  ?   Comments: Bilateral lower extremity pitting edema right greater than left, there is some asymmetry.  There is some clear effluent that is oozing from some of the skin tears, there is no erythema induration or redness to suggest cellulitis  ?Lymphadenopathy:  ?   Cervical: No cervical adenopathy.  ?Skin: ?   General: Skin is warm and dry.  ?   Findings: No erythema or rash.  ?Neurological:  ?   Mental Status: He is alert.  ?   Coordination: Coordination normal.  ?Psychiatric:     ?   Behavior: Behavior normal.  ? ? ?ED Results / Procedures / Treatments   ?Labs ?(all labs ordered are listed, but only abnormal results are displayed) ?Labs Reviewed  ?COMPREHENSIVE METABOLIC PANEL - Abnormal; Notable for the following components:  ?    Result Value  ? Creatinine, Ser 1.34 (*)   ? Albumin 3.3 (*)   ? All other components within normal limits  ?CBC WITH DIFFERENTIAL/PLATELET - Abnormal; Notable for the following components:  ? Hemoglobin 10.9 (*)   ? HCT 33.9 (*)   ? MCV 70.5 (*)   ? MCH 22.7 (*)   ? RDW 16.3 (*)   ? All other components within normal limits  ?TROPONIN I (HIGH SENSITIVITY) - Abnormal; Notable for the following components:  ? Troponin I (High Sensitivity) 22 (*)   ? All other components within normal limits  ?TROPONIN I (HIGH SENSITIVITY) - Abnormal; Notable for the following components:  ? Troponin I (High Sensitivity) 22 (*)   ? All other components within normal limits  ? ? ?EKG ?None ? ?Radiology ?DG Chest 2 View ? ?Result Date: 06/18/2021 ?CLINICAL DATA:  Chest pain. EXAM: CHEST - 2 VIEW COMPARISON:  January 30, 2020. FINDINGS: No consolidation. No visible pleural effusions or  pneumothorax. Cardiomediastinal silhouette is mildly enlarged. No evidence of acute osseous abnormality. IMPRESSION: 1. No evidence of acute pulmonary abnormality. 2. Mild cardiomegaly. Electronically Signed   By: Margaretha Sheffield M.D.   On: 06/18/2021 17:14  ? ?VAS [place] LOWER EXTREMITY VENOUS (DVT) (7a-7p) ? ?  Result Date: 06/18/2021 ? Lower Venous DVT Study Patient Name:  TAURIN GUILFOYLE  Date of Exam:   06/18/2021 Medical Rec #: [id number]   Accession #:    [id number] Date of Birth: [birth year]   Patient Gender: M Patient Age:   58 years Exam Location:  [facility] Procedure:      VAS [place] LOWER EXTREMITY VENOUS (DVT) Referring Phys: Benjamine Mola HAMMOND --------------------------------------------------------------------------------  Indications: Right lower extremity edema and pain.  Limitations: Body habitus and poor ultrasound/tissue interface. Comparison Study: 07/26/2017- negative bilateral lower extremity venous duplex Performing Technologist: Maudry Mayhew MHA, RDMS, RVT, RDCS  Examination Guidelines: A complete evaluation includes B-mode imaging, spectral Doppler, color Doppler, and power Doppler as needed of all accessible portions of each vessel. Bilateral testing is considered an integral part of a complete examination. Limited examinations for reoccurring indications may be performed as noted. The reflux portion of the exam is performed with the patient in reverse Trendelenburg.  +---------+---------------+---------+-----------+----------+--------------+ RIGHT    CompressibilityPhasicitySpontaneityPropertiesThrombus Aging +---------+---------------+---------+-----------+----------+--------------+ CFV      Full           Yes      Yes                                 +---------+---------------+---------+-----------+----------+--------------+ SFJ      Full                                                        +---------+---------------+---------+-----------+----------+--------------+ FV  Prox  Full                                                        +---------+---------------+---------+-----------+----------+--------------+ FV Mid   Full                                                        +---------+---------------

## 2021-06-18 NOTE — Discharge Instructions (Addendum)
When you follow-up with your doctor I would like for you to talk with them about your blood pressure medicine amlodipine which may cause some of your swelling ? ?Until then I would like you to increase the dose of your Lasix to take 40 mg by mouth 3 times a day instead of twice a day. ? ?Please continue to wear your compression stockings and return to the ER for severe or worsening symptoms. ?

## 2021-06-18 NOTE — ED Provider Triage Note (Signed)
Emergency Medicine Provider Triage Evaluation Note ? ?[name] , a 61 y.o. male  was evaluated in triage.  Pt complains of right leg pain and swelling.  He states this has been ongoing for about the past 17 days.  He had a heart cath and did not have any swelling however this started about a week after.  He states that his right leg has been painful and weeping.  He did not mention it during his cardiology visit since then.  He states his right leg is very painful. ? ?He has been taking care of his 58-year-old who has cancer and because of this has not been taking care of himself well. ? ?Physical Exam  ?BP (!) 135/104 (BP Location: Right Arm)   Pulse 100   Temp 98.5 ?F (36.9 ?C) (Oral)   Resp 18   Ht 5\' 9"  (1.753 m)   Wt 97.5 kg   SpO2 96%   BMI 31.75 kg/m?  ?Gen:   Awake, no distress   ?Resp:  Normal effort  ?MSK:   Moves extremities without difficulty  ?Other:  Obvious edema in the right leg compared to left.  Patient has jeans on limiting exam in the triage setting, however there is weeping on the lower leg.  I am able to Doppler DP and PT pulse on the right leg. ? ?Medical Decision Making  ?Medically screening exam initiated at 4:33 PM.  Appropriate orders placed.  was informed that the remainder of the evaluation will be completed by another provider, this initial triage assessment does not replace that evaluation, and the importance of remaining in the ED until their evaluation is complete. ? ? ?  ?Shawn Leyden, PA-C ?06/18/21 1643 ? ?

## 2021-06-18 NOTE — ED Notes (Signed)
Pt is now reporting CP. PA informed and EKG in process. ?

## 2021-06-18 NOTE — Progress Notes (Signed)
Right lower extremity venous duplex completed. ?Refer to "CV Proc" under chart review to view preliminary results. ? ?Patient encounter- ?At the end of the exam, patient reported a fleeting feeling of left-sided chest pain that would "start sharp, then turn dull." Patient rated this pain as 4/10. Exam was then completed and patient returned to wheelchair for transport back to emergency department. During transport, patient became short of breath, unable to complete sentences, unable to rate pain level, but did state that it felt worse. Patient was handed off to [place], EMT, and patient was promptly returned to triage for evaluation. Lyndel Safe, PA-C notified of patient encounter. ? ?06/18/2021 6:11 PM ?Eula Fried., MHA, RVT, RDCS, RDMS   ?

## 2021-06-18 NOTE — ED Notes (Signed)
Pt's legs were wrapped with kerlix and coban from below the knee to the ankle to act as compression stockings. Pt expressed some relief. Discharge instructions, medications and follow up care reviewed and explained. Pt verbalized understanding. ?

## 2021-06-21 ENCOUNTER — Telehealth: Payer: Self-pay | Admitting: Critical Care Medicine

## 2021-06-21 ENCOUNTER — Other Ambulatory Visit: Payer: Self-pay

## 2021-06-21 MED ORDER — APIXABAN 5 MG PO TABS: 5.0000 mg | ORAL_TABLET | Freq: Two times a day (BID) | ORAL | 1 refills | Status: DC

## 2021-06-21 NOTE — Telephone Encounter (Signed)
Patient was seen in the ER on 06/18/21. He was told to follow up with his PCP within two days. There are no available appointments. Patient is scheduled for 07/29/21. He stated he cant wait that long. He needs and urgent appointment due to the swelling in his legs. Would like a call back ASAP ?

## 2021-06-21 NOTE — Telephone Encounter (Signed)
Will route this message to our front staff to see if there are any sooner appointments or other options such as a different clinic or mobile clinic visit.  ?

## 2021-06-21 NOTE — Telephone Encounter (Signed)
Appt. scheduled on Wed. 06/23/2021 with Dr. Delford Field.  Patient aware.  ?

## 2021-06-23 ENCOUNTER — Encounter: Payer: Self-pay | Admitting: Critical Care Medicine

## 2021-06-23 ENCOUNTER — Ambulatory Visit: Payer: Medicare Other | Attending: Critical Care Medicine | Admitting: Critical Care Medicine

## 2021-06-23 ENCOUNTER — Other Ambulatory Visit: Payer: Self-pay

## 2021-06-23 VITALS — BP 124/81 | HR 74 | Temp 98.3°F | Resp 18 | Ht 69.0 in | Wt 224.0 lb

## 2021-06-23 DIAGNOSIS — I350 Nonrheumatic aortic (valve) stenosis: Secondary | ICD-10-CM | POA: Diagnosis not present

## 2021-06-23 DIAGNOSIS — I10 Essential (primary) hypertension: Secondary | ICD-10-CM | POA: Diagnosis not present

## 2021-06-23 DIAGNOSIS — R600 Localized edema: Secondary | ICD-10-CM | POA: Insufficient documentation

## 2021-06-23 DIAGNOSIS — N183 Chronic kidney disease, stage 3 unspecified: Secondary | ICD-10-CM | POA: Diagnosis not present

## 2021-06-23 DIAGNOSIS — Z79899 Other long term (current) drug therapy: Secondary | ICD-10-CM | POA: Insufficient documentation

## 2021-06-23 DIAGNOSIS — K551 Chronic vascular disorders of intestine: Secondary | ICD-10-CM | POA: Diagnosis not present

## 2021-06-23 DIAGNOSIS — I4819 Other persistent atrial fibrillation: Secondary | ICD-10-CM | POA: Insufficient documentation

## 2021-06-23 DIAGNOSIS — N1832 Chronic kidney disease, stage 3b: Secondary | ICD-10-CM

## 2021-06-23 DIAGNOSIS — I2510 Atherosclerotic heart disease of native coronary artery without angina pectoris: Secondary | ICD-10-CM | POA: Insufficient documentation

## 2021-06-23 DIAGNOSIS — G4733 Obstructive sleep apnea (adult) (pediatric): Secondary | ICD-10-CM | POA: Insufficient documentation

## 2021-06-23 DIAGNOSIS — I5032 Chronic diastolic (congestive) heart failure: Secondary | ICD-10-CM | POA: Insufficient documentation

## 2021-06-23 DIAGNOSIS — Z9989 Dependence on other enabling machines and devices: Secondary | ICD-10-CM | POA: Diagnosis not present

## 2021-06-23 DIAGNOSIS — E7841 Elevated Lipoprotein(a): Secondary | ICD-10-CM

## 2021-06-23 DIAGNOSIS — K219 Gastro-esophageal reflux disease without esophagitis: Secondary | ICD-10-CM | POA: Insufficient documentation

## 2021-06-23 DIAGNOSIS — I130 Hypertensive heart and chronic kidney disease with heart failure and stage 1 through stage 4 chronic kidney disease, or unspecified chronic kidney disease: Secondary | ICD-10-CM | POA: Insufficient documentation

## 2021-06-23 DIAGNOSIS — E785 Hyperlipidemia, unspecified: Secondary | ICD-10-CM | POA: Insufficient documentation

## 2021-06-23 DIAGNOSIS — M79606 Pain in leg, unspecified: Secondary | ICD-10-CM | POA: Insufficient documentation

## 2021-06-23 LAB — MAGNESIUM: Magnesium: 2 mg/dL (ref 1.6–2.3)

## 2021-06-23 LAB — BASIC METABOLIC PANEL
BUN/Creatinine Ratio: 24 (ref 10–24)
BUN: 37 mg/dL — ABNORMAL HIGH (ref 8–27)
Calcium: 9.5 mg/dL (ref 8.6–10.2)
Chloride: 105 mmol/L (ref 96–106)
Creatinine, Ser: 1.53 mg/dL — ABNORMAL HIGH (ref 0.76–1.27)
Glucose: 114 mg/dL — ABNORMAL HIGH (ref 70–99)
Potassium: 4.8 mmol/L (ref 3.5–5.2)
Sodium: 139 mmol/L (ref 134–144)
eGFR: 51 mL/min/{1.73_m2} — ABNORMAL LOW (ref >59)

## 2021-06-23 NOTE — Assessment & Plan Note (Signed)
Open wounds have resolved no active cellulitis ?

## 2021-06-23 NOTE — Assessment & Plan Note (Signed)
Stable renal disease ?

## 2021-06-23 NOTE — Assessment & Plan Note (Signed)
Severe aortic stenosis patient is cleared medically to pursue aortic valve replacement which is scheduled April 3 ? ?There is no evidence of active cellulitis in lower extremities ?

## 2021-06-23 NOTE — Progress Notes (Signed)
? ?Subjective:  ? ? Patient ID: Garritt Manny, male    DOB: 1960/10/12, 61 y.o.   MRN: [id number] ? ?02/11/20 ?Here to est PCP 61 y.o.M HTN, AS mild, diastolic CHF, GERD ? ?This patient is referred from the emergency room for post ED follow-up where this patient was seen on October 28 with lower extremity cellulitis.. ? ?The patient presented with leg pain chest pain and dyspnea.  Vital signs did show elevated blood pressures patient was in no acute distress his shortness of breath was chronic evaluation showed elevated troponins EKG was unremarkable creatinine was elevated BNP elevated 162 urine drug screen was negative the patient was sent home to follow-up with primary care for symptom recheck he appears today for this.  On arrival blood pressure was 234/140. ? ?Despite two doses of 0.2 mg clonidine the patient's blood pressure did not fall and was at 230/139 on discharge. ? ?I called the emergency room and indicated he would need to come over for further treatment and evaluation. ? ?The patient does have drainage in the lower extremities.  He states lisinopril causes a cough.  He is not drinking alcohol is not smoking. ? ? ?02/18/2020 ?This patient returns for a 1 week follow-up when he was seen for hypertension.  At that visit he was 230/140 and we gave him 2 doses of 0.2 mg clonidine.  He subsequently had to be sent to the emergency room as his blood pressure was not going down.  Eventually it did go down without additional medication.  Today on arrival he is 150/95.  He does state he has been having some shortness of breath with exertion and some dizziness.  He also complains of continued drainage from his lower extremities which are wrapping all the way around the wound now the calf up to the knees.  He has been placing the dressings as prescribed.  He has been taking showers however.  He is yet to achieve a wound care visit.  He also does not have a cardiology visit as of yet. ? ?03/03/2020 ?This patient is seen  in return follow-up blood pressure is still not yet at goal on arrival he is at 165/97.  Patient maintains all blood pressure medications however he occasionally skips his hydralazine doses.  He has been to the wound care center and has bilateral dressings now on lower extremities and his wounds are markedly improved also note there is no evidence of cellulitis on the wounds as well.  He has a few days left of his antibiotics.  He has weekly visits to the wound center planned.  He also has an upcoming nephrology appointment ?The patient denies any shortness of breath or chest pain at this time ? ?06/23/21 ?Patient returns to the clinic after an absence since November 2021 ?This patient's been followed closely by cardiology and nephrology and management of his blood pressure.  When I first saw him he was 240/140 but now he is markedly better on multiple medications.  He comes in today because he wants his legs checked before he undergoes surgery in 2 weeks to have his aortic valve replaced.  He has been diagnosed with severe aortic stenosis he does have coronary artery disease but no interventions were needed on this.  Heart function has been preserved.  He does have renal insufficiency some chronic kidney disease stage III however renal function has improved.  He also has sleep apnea and the use of a CPAP device has markedly improved his blood pressure  control. ? ?Patient's been seen by cardiology and thoracic surgery.  These visits occurred earlier in the month and then he went to the emergency room on March 17 because of significant edema right lower extremity.  He had a DVT study that was negative at that visit.  He states since taking his furosemide as recommended by the emergency room his edema has decreased in his legs.  He wanted his legs checked today to make sure he did not have active cellulitis which she has had in the past.  There was a question whether amlodipine was contributing to his edema but he has  been on amlodipine for at least 6 years ? ? ?Has AVR scheduled 07/05/21 ? ?06/11/21 TCTS: ?The patient is a 61 year old gentleman with history of difficult to control hypertension, hyperlipidemia, chronic diastolic congestive heart failure, OSA on CPAP, recently diagnosed atrial fibrillation, gout, GERD, and aortic stenosis.  A 2D echo in January 2022 showed a severely calcified and thickened aortic valve with a mean gradient of 37.5 mmHg.  A follow-up last July showed a mean gradient of 33 mmHg.  His most recent echo on 03/18/2021 showed a mean gradient of 33.3 mmHg with peak gradient 64 mmHg.  Valve area by VTI was 1.05 cm?.  Dimensionless index was 0.3.  Stroke-volume index was 33.  Left ventricular ejection fraction was 70 to 75% with severe asymmetric left ventricular hypertrophy.  Left and right atrial sizes were severely dilated.  There was mild mitral regurgitation.  He underwent a cardiac MR on 05/03/2021 showing severe LVH with no evidence of amyloidosis.  Cardiac catheterization on 05/27/2021 showed mild nonobstructive disease in the proximal and mid LAD.  There was a 90% distal LAD stenosis near the apex.  There was a 90% second marginal stenosis.  Left circumflex was a large vessel with moderate distal stenosis.  The right coronary artery was a large dominant vessel with mild proximal stenosis.  The PDA and posterolateral branches had moderate diffuse disease. ?  ?He was diagnosed with atrial fibrillation during his echocardiogram in December 2022 and remained in atrial fibrillation when he was seen in cardiology clinic in January.  He was started on Eliquis and continued on Coreg for rate control. ?  ?The patient is here today by himself.  He reports having exertional shortness of breath but denies any fatigue.  His shortness of breath typically occurs with walking any significant distance.  He denies any chest pain or pressure.  He denies dizziness and syncope.  He has had some peripheral edema.  His main  complaint today is swelling in his right knee which he injured in the past and had to have surgery on it.  This has been limiting his ability to ambulate and he is planning on going to urgent care to have it evaluated.  He has had some fluid drawn off over the past. ? ?  ?Impression: ?  ?This 61 year old gentleman has stage D, severe, symptomatic aortic stenosis with New York Heart Association class II symptoms of exertional shortness of breath and lower extremity edema consistent with chronic diastolic congestive heart failure.  His echocardiogram shows a severely calcified and thickened aortic valve with a mean gradient of 33.3 mmHg and a peak gradient of 64 mmHg.  Stroke-volume index is low at 33.  He has severe LVH likely due to uncontrolled hypertension and severe aortic stenosis.  He also has recently diagnosed atrial fibrillation and has been managed on Eliquis and Coreg.  Given his relatively  young age I think that open surgical aortic valve replacement would be the best option for treating him.  I think he would benefit from a biatrial maze procedure at the same time.  I discussed the alternative of transcatheter aortic valve replacement with him including my reasons for not recommending that procedure in his particular case including his young age, severe asymmetric LVH and atrial fibrillation.  He seems understand and agrees with the plan for open surgery.  I discussed the pros and cons of mechanical and bioprosthetic valves.  I think a bioprosthetic valve is probably the best option in his case.  He understands that he will still need to be on anticoagulation postoperatively with his history of atrial fibrillation until we are sure that he is maintaining sinus rhythm long-term. I discussed the operative procedure with the patient including alternatives, benefits and risks; including but not limited to bleeding, blood transfusion, infection, stroke, myocardial infarction, graft failure, heart block  requiring a permanent pacemaker, organ dysfunction, and death.  [name] understands and agrees to proceed.   ?  ?Based on the above evaluation by thoracic cardiac surgery he is scheduled April 3 for aor

## 2021-06-23 NOTE — Assessment & Plan Note (Signed)
Marked improvement in blood pressure control thanks to cardiology intervention ? ?No change in medications ?

## 2021-06-23 NOTE — Patient Instructions (Signed)
No change in medications ? ?Stop your eliquis / apixaban on March 29 ? ?Return Dr Delford Field end of may/first of June  ?

## 2021-06-23 NOTE — Assessment & Plan Note (Signed)
At goal on current program ?

## 2021-06-23 NOTE — Assessment & Plan Note (Signed)
Rate currently is controlled ?

## 2021-06-23 NOTE — Assessment & Plan Note (Signed)
Not have a CPAP machine doing well on same ?

## 2021-06-23 NOTE — Progress Notes (Signed)
Patient has eaten and taken medication today ?Patient reports pain in the R leg from swelling. Patient reports bilateral swelling with R leg being the worse. ?Patient mentioned amlodipine causing the swelling and wanting to discuss. ?

## 2021-06-30 ENCOUNTER — Encounter: Payer: Medicaid Other | Admitting: Cardiology

## 2021-06-30 ENCOUNTER — Other Ambulatory Visit: Payer: Self-pay

## 2021-06-30 ENCOUNTER — Other Ambulatory Visit: Payer: Self-pay | Admitting: *Deleted

## 2021-06-30 ENCOUNTER — Telehealth: Payer: Self-pay | Admitting: *Deleted

## 2021-06-30 NOTE — Progress Notes (Signed)
This encounter was created in error - please disregard.

## 2021-06-30 NOTE — Telephone Encounter (Signed)
Patient called with questions about his Pre-op appts and surgery on Monday 4/3 which I answered but then his wife got on the phone with some concerns and let me know that he had stop taking his Benicar and Lipitor (unknown reasons), his legs were very swollen with wounds and drainage.  He had not been checking his B/P at home so I asked him to do that and daily weights.  His weight today was 214lbs and B/P was 220/136.  She also sent in pictures of his legs and both look swollen with small open wounds/drainage.  I showed Dr. Laneta Simmers and updated him and he thinks it is best to cancel his AVR surgery on Monday 4/3 so he can get tuned up.  I reached out to Dr. Bjorn Pippin who agreed patient needed to go to the ER for eval.  After speaking back with the patient and his wife, I advised him to go to [facility] ER for b/p management and HF tune up but he says he is already better from taking his meds.  I told him it would take more than one dose to fix which he understands but is refusing to go to the ER at this time.  He said he will call me later with another update and to let me know if he will go to the ER or not.  I have updated Drs. Bartle and [employer].   ? ?At this time, patients AVR/MAZE surgery is canceled on Monday 4/3 until patient is doing better.  He understands and has no further questions. ?

## 2021-07-01 ENCOUNTER — Other Ambulatory Visit (HOSPITAL_COMMUNITY): Payer: Medicare Other

## 2021-07-01 ENCOUNTER — Ambulatory Visit: Payer: Medicaid Other | Admitting: Critical Care Medicine

## 2021-07-01 ENCOUNTER — Ambulatory Visit (HOSPITAL_COMMUNITY): Payer: Medicare Other

## 2021-07-02 ENCOUNTER — Other Ambulatory Visit (HOSPITAL_COMMUNITY): Payer: Medicare Other

## 2021-07-02 ENCOUNTER — Ambulatory Visit (HOSPITAL_COMMUNITY): Payer: Medicare Other

## 2021-07-03 DIAGNOSIS — G4733 Obstructive sleep apnea (adult) (pediatric): Secondary | ICD-10-CM | POA: Diagnosis not present

## 2021-07-05 ENCOUNTER — Inpatient Hospital Stay: Admit: 2021-07-05 | Payer: Medicare Other | Admitting: Surgery

## 2021-07-05 SURGERY — REPLACEMENT, AORTIC VALVE, OPEN
Anesthesia: General | Site: Chest

## 2021-07-07 ENCOUNTER — Ambulatory Visit (HOSPITAL_COMMUNITY): Payer: Medicaid Other | Admitting: Physician Assistant

## 2021-07-29 ENCOUNTER — Ambulatory Visit: Payer: Medicaid Other | Admitting: Critical Care Medicine

## 2021-08-22 NOTE — Progress Notes (Signed)
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 03/18/2021/12:33:42 PM         Final     Physicians   Panel Physicians Referring Physician Case Authorizing Physician Burnell Blanks, MD (Primary)       Procedures   RIGHT/LEFT HEART CATH AND CORONARY ANGIOGRAPHY   Conclusion       Ost RCA to Prox RCA lesion is 40% stenosed.   Prox RCA to Mid RCA lesion is 20% stenosed.   RPDA lesion is 30% stenosed.   RPAV lesion is 50% stenosed.   Dist Cx lesion is 50% stenosed.   2nd Diag lesion is 90% stenosed.   Prox LAD to Mid LAD lesion is 30% stenosed.   Dist LAD lesion is 90% stenosed.   1. The LAD is a large caliber vessel that courses to the apex. There is mild non-obstructive disease in the proximal and mid LAD. The apical LAD has diffuse severe stenosis. The first Diagonal is a moderate caliber vessel with mild non-obstructive disease. The second Diagonal branch is a moderate caliber vessel with a focal severe  stenosis 2. The Circumflex is a large caliber vessel with moderate distal stenosis 3. The RCA is a large dominant artery with mild eccentric proximal stenosis. The PDA and posterolateral arteries have moderate diffuse disease.  4. Severe aortic stenosis by echo. The aortic valve was not crossed today.    RA 14 RV 42/12/19 PA 44/22 (mean 27) PCWP 19 AO 103/64   Will continue workup for AVR. Given his severe LVH and young age, he may be better treated with surgical AVR than TAVR. Will review with our valve team and make arrangements for CT scans if felt necessary before being seen by our  CT surgeon.    Indications   Severe aortic stenosis [I35.0 (ICD-10-CM)]   Procedural Details   Technical Details Indication: 61 yo male with severe aortic stenosis, workup for TAVR vs surgical AVR  Procedure: The risks, benefits, complications, treatment options, and expected outcomes were discussed with the patient. The patient and/or family concurred with the proposed plan, giving informed consent. The patient was brought to the cath lab after IV hydration was given. The patient was sedated with Versed and Fentanyl. The IV catheter in the right wrist was changed for a 5 French sheath. Right heart catheterization performed with a balloon tipped catheter. The right wrist was prepped and draped in a sterile fashion. 1% lidocaine was used for local anesthesia. Using the modified Seldinger access technique, a 5 French sheath was placed in the right radial artery. 3 mg Verapamil was given through the sheath. Weight based IV heparin was given. Standard diagnostic catheters were used to perform selective coronary angiography. I did not cross the aortic valve. All catheter exchanges were performed over an exchange length guidewire.   The sheath was removed from the right radial artery and a Terumo hemostasis band was applied at the arteriotomy site on the right wrist.      Fluoro time: 3.1 (min) DAP: 20080  (mGycm2) Cumulative Air Kerma: 741 (mGy) Complications   Complications documented before study signed (05/27/2021 28:78 AM)    No complications were associated with this study. Documented by Jamie Kato - 05/27/2021  9:57 AM    Coronary Findings   Diagnostic Dominance: Right Left Anterior Descending Vessel is large. Prox LAD to Mid LAD lesion is 30% stenosed. Dist LAD lesion is 90% stenosed.  Second Diagonal Branch Vessel is moderate in size. 2nd Diag lesion is 90% stenosed.  Left Circumflex Vessel is large. Dist Cx lesion is 50% stenosed.  Right Coronary Artery Vessel is large. Ost RCA to Prox RCA lesion is 40% stenosed. The lesion is eccentric. Prox RCA to Mid RCA lesion is 20% stenosed.  Right Posterior Descending Artery RPDA lesion is 30% stenosed.  Right Posterior Atrioventricular Artery RPAV lesion is 50% stenosed.  Intervention   No interventions have been documented.   Coronary Diagrams   Diagnostic Dominance: Right Intervention   Implants      No implant documentation for this case.   Syngo Images    Show images for CARDIAC CATHETERIZATION Images on Long Term Storage    Show images for [place], [place] to Procedure Log    Procedure Log  Hemo Data   Flowsheet Row Most Recent Value Fick Cardiac Output 7.06 L/min Fick Cardiac Output Index 3.32 (L/min)/BSA RA A Wave 15 mmHg RA V Wave 16 mmHg RA Mean 15 mmHg RV Systolic Pressure 42 mmHg RV Diastolic Pressure 10 mmHg RV EDP 17 mmHg PA Systolic Pressure 42 mmHg PA Diastolic Pressure 22 mmHg PA Mean 31 mmHg PW A Wave 19 mmHg PW V Wave 25 mmHg PW Mean 20 mmHg AO Systolic Pressure 676 mmHg AO Diastolic Pressure 74 mmHg AO Mean 84 mmHg QP/QS 1 TPVR Index 9.34 HRUI TSVR Index 24.09 HRUI PVR SVR Ratio 0.17 TPVR/TSVR Ratio 0.39       Assessment & Plan:  I personally reviewed all images and lab data in the [facility] system as well as any outside material available during this office  visit and agree with the  radiology impressions.   HTN (hypertension) Not well controlled secondary to increased volume status  Patient will not be on amlodipine  further because of leg edema  Continue current blood pressure medicines as prescribed with the exception of increasing Benicar to 40 mg daily  Persistent atrial fibrillation (HCC) Rate currently controlled on carvedilol  Severe aortic stenosis Surgery was canceled for aortic valve replacement.  We need to be careful about over treating blood pressure and dropping the pressure level.  OSA (obstructive sleep apnea) Patient currently does not have a CPAP machine apparently was malfunctioning was picked up by [facility] and a new machine never returned.  His sleep physician is Golden Hurter with cardiology.  I have asked my clinical case manager to contact the homecare company to see if we can get his CPAP machine reissued  Gout Continue with allopurinol daily  Stage 3b chronic kidney disease ([place]) Reassess renal function  History of chronic skin ulcer Has breakdown again in the lower extremities will refer back to the wound care center  Chronic diastolic heart failure ([place]) Lungs sound fairly clear will increase Benicar and get patient back in with cardiology as soon as possible  Secondary hypercoagulable state ([place]) Assess CBC   Diagnoses and all orders for this visit:  Chronic diastolic heart failure ([place]) -     Comprehensive metabolic panel -     CBC with Differential/Platelet -     Ambulatory referral to Cardiology  Severe aortic stenosis -     Comprehensive metabolic panel -     Ambulatory referral to Cardiology  Persistent atrial fibrillation ([place]) -     CBC with Differential/Platelet -     Ambulatory referral to Cardiology  Elevated lipoprotein(a)  Stage 3b chronic kidney disease ([place]) -     Comprehensive metabolic panel -     Lipid panel  History of chronic skin ulcer  Venous stasis ulcer of  calf limited to breakdown of skin with varicose veins, unspecified laterality (HCC) -     CBC with Differential/Platelet -     AMB referral to wound care center  Primary hypertension  OSA (obstructive sleep apnea)  Idiopathic gout, unspecified chronicity, unspecified site  Secondary hypercoagulable state ([place])  Other orders -     olmesartan (BENICAR) 40 MG tablet; Take 1 tablet (40 mg total) by mouth daily. -     allopurinol (ZYLOPRIM) 100 MG tablet; TAKE 1 TABLET (100 MG TOTAL) BY MOUTH DAILY. -     apixaban (ELIQUIS) 5 MG TABS tablet; Take 1 tablet (5 mg total) by mouth 2 (two) times daily. -     atorvastatin (LIPITOR) 20 MG tablet; Take 1 tablet (20 mg total) by mouth daily. -     carvedilol (COREG) 25 MG tablet; Take 1 tablet (25 mg total) by mouth 2 (two) times daily with a meal. -     cloNIDine (CATAPRES) 0.2 MG tablet; Take 1 tablet (0.2 mg total) by mouth 3 (three) times daily. -     furosemide (LASIX) 40 MG tablet; Take 1 tablet (40 mg total) by mouth 3 (three) times daily. -     hydrALAZINE (APRESOLINE) 50 MG tablet; Take 1 tablet (50 mg total) by mouth 3 (three) times daily. -     Potassium Chloride ER 20 MEQ TBCR; Take 20 mEq by mouth 2 (two) times daily. Take with Lasix (furosemide)  38 minutes spent assessing patient performing history and physical complex decision making is high  Review of Systems  Constitutional:  Negative for fatigue and fever.  HENT:  Positive for postnasal drip and rhinorrhea.   Eyes:  Negative for visual  disturbance.  Respiratory:  Negative for cough, choking, shortness of breath, wheezing and stridor.   Cardiovascular:  Positive for leg swelling. Negative for chest pain and palpitations.       Legs hurt and burning Leg swollen  Gastrointestinal:  Negative for abdominal pain, blood in stool, constipation, diarrhea, nausea, rectal pain and vomiting.  Genitourinary:  Negative for enuresis.  Musculoskeletal: Negative.   Skin:  Negative for wound.   Neurological:  Negative for syncope, weakness, light-headedness and headaches.  Psychiatric/Behavioral:  Positive for sleep disturbance. Negative for self-injury.

## 2021-08-23 ENCOUNTER — Telehealth: Payer: Self-pay | Admitting: Critical Care Medicine

## 2021-08-23 ENCOUNTER — Telehealth: Payer: Self-pay

## 2021-08-23 ENCOUNTER — Encounter: Payer: Self-pay | Admitting: Critical Care Medicine

## 2021-08-23 ENCOUNTER — Ambulatory Visit: Payer: Medicare Other | Attending: Critical Care Medicine | Admitting: Critical Care Medicine

## 2021-08-23 VITALS — BP 133/99 | HR 71 | Wt 210.4 lb

## 2021-08-23 DIAGNOSIS — G4733 Obstructive sleep apnea (adult) (pediatric): Secondary | ICD-10-CM

## 2021-08-23 DIAGNOSIS — L97201 Non-pressure chronic ulcer of unspecified calf limited to breakdown of skin: Secondary | ICD-10-CM | POA: Diagnosis not present

## 2021-08-23 DIAGNOSIS — I83002 Varicose veins of unspecified lower extremity with ulcer of calf: Secondary | ICD-10-CM | POA: Diagnosis not present

## 2021-08-23 DIAGNOSIS — N1832 Chronic kidney disease, stage 3b: Secondary | ICD-10-CM | POA: Diagnosis not present

## 2021-08-23 DIAGNOSIS — I350 Nonrheumatic aortic (valve) stenosis: Secondary | ICD-10-CM

## 2021-08-23 DIAGNOSIS — M1000 Idiopathic gout, unspecified site: Secondary | ICD-10-CM

## 2021-08-23 DIAGNOSIS — E7841 Elevated Lipoprotein(a): Secondary | ICD-10-CM

## 2021-08-23 DIAGNOSIS — I4819 Other persistent atrial fibrillation: Secondary | ICD-10-CM

## 2021-08-23 DIAGNOSIS — D6869 Other thrombophilia: Secondary | ICD-10-CM | POA: Diagnosis not present

## 2021-08-23 DIAGNOSIS — I10 Essential (primary) hypertension: Secondary | ICD-10-CM | POA: Diagnosis not present

## 2021-08-23 DIAGNOSIS — I5032 Chronic diastolic (congestive) heart failure: Secondary | ICD-10-CM

## 2021-08-23 DIAGNOSIS — Z872 Personal history of diseases of the skin and subcutaneous tissue: Secondary | ICD-10-CM

## 2021-08-23 MED ORDER — ALLOPURINOL 100 MG PO TABS: ORAL_TABLET | Freq: Every day | ORAL | 6 refills | Status: DC

## 2021-08-23 MED ORDER — OLMESARTAN MEDOXOMIL 40 MG PO TABS: 40.0000 mg | ORAL_TABLET | Freq: Every day | ORAL | 2 refills | Status: DC

## 2021-08-23 MED ORDER — ATORVASTATIN CALCIUM 20 MG PO TABS: 20.0000 mg | ORAL_TABLET | Freq: Every day | ORAL | 3 refills | Status: DC

## 2021-08-23 MED ORDER — FUROSEMIDE 40 MG PO TABS: 40.0000 mg | ORAL_TABLET | Freq: Three times a day (TID) | ORAL | 2 refills | Status: DC

## 2021-08-23 MED ORDER — APIXABAN 5 MG PO TABS: 5.0000 mg | ORAL_TABLET | Freq: Two times a day (BID) | ORAL | 1 refills | Status: DC

## 2021-08-23 MED ORDER — POTASSIUM CHLORIDE ER 20 MEQ PO TBCR
20.0000 meq | EXTENDED_RELEASE_TABLET | Freq: Two times a day (BID) | ORAL | 3 refills | Status: DC
Start: 2021-08-23 — End: 2021-10-27

## 2021-08-23 MED ORDER — CARVEDILOL 25 MG PO TABS: 25.0000 mg | ORAL_TABLET | Freq: Two times a day (BID) | ORAL | 3 refills | Status: DC

## 2021-08-23 MED ORDER — HYDRALAZINE HCL 50 MG PO TABS: 50.0000 mg | ORAL_TABLET | Freq: Three times a day (TID) | ORAL | 3 refills | Status: DC

## 2021-08-23 MED ORDER — CLONIDINE HCL 0.2 MG PO TABS: 0.2000 mg | ORAL_TABLET | Freq: Three times a day (TID) | ORAL | 2 refills | Status: DC

## 2021-08-23 NOTE — Assessment & Plan Note (Signed)
Lungs sound fairly clear will increase Benicar and get patient back in with cardiology as soon as possible

## 2021-08-23 NOTE — Telephone Encounter (Signed)
Opal Sidles I need you to urgently figure out why this patient's not getting his CPAP machine.  The patient had a CPAP machine that was defective that was picked up by [facility] but he is yet to receive a replacement.  He has severe heart failure and aortic stenosis and needs his CPAP machine as soon as possible.  Golden Hurter is his sleep medicine doctor at cardiology

## 2021-08-23 NOTE — Assessment & Plan Note (Signed)
Reassess renal function 

## 2021-08-23 NOTE — Telephone Encounter (Signed)
I received a call from Sharon/ [facility]. She said that the patient's machine was not taken from him because of a defect, he was non compliant with use and the insurance company would not pay for it. She said that no one from [facility] called him about a defective machine and/or replacing his machine.   He was set up with the CPAP machine 02/2021 and the 1st 90 days he had the machine the downloads showed 1% compliance with use.   The download for the last 90 days showed he used the machine 5 of 90 days for an average of 32 minutes.   For his insurance to cover the cost of the machine, he must use it a minimum of 4 hours/ night and not miss a night and that would be 70 % compliant.   Jasmine December said that if he is to receive another CPAP machine, he must see Dr Mayford Knife and he will need to have another sleep study and it must be in person.

## 2021-08-23 NOTE — Assessment & Plan Note (Signed)
Continue with allopurinol daily

## 2021-08-23 NOTE — Assessment & Plan Note (Signed)
Has breakdown again in the lower extremities will refer back to the wound care center

## 2021-08-23 NOTE — Assessment & Plan Note (Signed)
Assess CBC 

## 2021-08-23 NOTE — Patient Instructions (Signed)
Refills on all medicines sent to your [facility]  Increase Benicar olmesartan to 2 of the 20 mg tablets daily and the new refill will be a single 40 mg tablet to take daily  Stay on your furosemide clonidine hydralazine as prescribed  Stay on potassium supplements as prescribed  Urgent referral to wound care center was made and also to cardiology  Our nurse case manager Erskine Squibb will check into the status of your CPAP machine  Complete screening labs will be obtained today  Return to see Dr. Delford Field in 3 weeks face-to-face

## 2021-08-23 NOTE — Assessment & Plan Note (Signed)
Rate currently controlled on carvedilol

## 2021-08-23 NOTE — Assessment & Plan Note (Signed)
Surgery was canceled for aortic valve replacement.  We need to be careful about over treating blood pressure and dropping the pressure level.

## 2021-08-23 NOTE — Telephone Encounter (Signed)
I called [facility] [phone] and spoke to Amy regarding the CPAP.Marland Kitchen She said that she will need to have the supervisor call me back because she can't determine from the notes the reason why he doesn't have a CPAP machine.  There is a question about compliance as well as insurance coverage

## 2021-08-23 NOTE — Telephone Encounter (Signed)
Excellent information , thank you Erskine Squibb

## 2021-08-23 NOTE — Assessment & Plan Note (Signed)
Not well controlled secondary to increased volume status  Patient will not be on amlodipine further because of leg edema  Continue current blood pressure medicines as prescribed with the exception of increasing Benicar to 40 mg daily

## 2021-08-23 NOTE — Assessment & Plan Note (Signed)
Patient currently does not have a CPAP machine apparently was malfunctioning was picked up by [facility] and a new machine never returned.  His sleep physician is Carolanne Grumbling with cardiology.  I have asked my clinical case manager to contact the homecare company to see if we can get his CPAP machine reissued

## 2021-08-24 ENCOUNTER — Other Ambulatory Visit: Payer: Self-pay | Admitting: Critical Care Medicine

## 2021-08-24 DIAGNOSIS — G4733 Obstructive sleep apnea (adult) (pediatric): Secondary | ICD-10-CM

## 2021-08-24 LAB — LIPID PANEL
Chol/HDL Ratio: 3.9 ratio (ref 0.0–5.0)
Cholesterol, Total: 94 mg/dL — ABNORMAL LOW (ref 100–199)
HDL: 24 mg/dL — ABNORMAL LOW (ref >39)
LDL Chol Calc (NIH): 46 mg/dL (ref 0–99)
Triglycerides: 138 mg/dL (ref 0–149)
VLDL Cholesterol Cal: 24 mg/dL (ref 5–40)

## 2021-08-24 LAB — CBC WITH DIFFERENTIAL/PLATELET
Basophils Absolute: 0 10*3/uL (ref 0.0–0.2)
Basos: 0 %
EOS (ABSOLUTE): 0.1 10*3/uL (ref 0.0–0.4)
Eos: 2 %
Hematocrit: 38.6 % (ref 37.5–51.0)
Hemoglobin: 11.4 g/dL — ABNORMAL LOW (ref 13.0–17.7)
Immature Grans (Abs): 0 10*3/uL (ref 0.0–0.1)
Immature Granulocytes: 1 %
Lymphocytes Absolute: 1.5 10*3/uL (ref 0.7–3.1)
Lymphs: 18 %
MCH: 22.4 pg — ABNORMAL LOW (ref 26.6–33.0)
MCHC: 29.5 g/dL — ABNORMAL LOW (ref 31.5–35.7)
MCV: 76 fL — ABNORMAL LOW (ref 79–97)
Monocytes Absolute: 0.6 10*3/uL (ref 0.1–0.9)
Monocytes: 8 %
Neutrophils Absolute: 5.8 10*3/uL (ref 1.4–7.0)
Neutrophils: 71 %
Platelets: 254 10*3/uL (ref 150–450)
RBC: 5.08 x10E6/uL (ref 4.14–5.80)
RDW: 17.1 % — ABNORMAL HIGH (ref 11.6–15.4)
WBC: 8.1 10*3/uL (ref 3.4–10.8)

## 2021-08-24 LAB — COMPREHENSIVE METABOLIC PANEL
ALT: 13 IU/L (ref 0–44)
AST: 18 IU/L (ref 0–40)
Albumin/Globulin Ratio: 0.8 — ABNORMAL LOW (ref 1.2–2.2)
Albumin: 4 g/dL (ref 3.8–4.8)
Alkaline Phosphatase: 95 IU/L (ref 44–121)
BUN/Creatinine Ratio: 13 (ref 10–24)
BUN: 19 mg/dL (ref 8–27)
Bilirubin Total: 0.5 mg/dL (ref 0.0–1.2)
CO2: 23 mmol/L (ref 20–29)
Calcium: 9.9 mg/dL (ref 8.6–10.2)
Chloride: 100 mmol/L (ref 96–106)
Creatinine, Ser: 1.44 mg/dL — ABNORMAL HIGH (ref 0.76–1.27)
Globulin, Total: 4.8 g/dL — ABNORMAL HIGH (ref 1.5–4.5)
Glucose: 99 mg/dL (ref 70–99)
Potassium: 4.7 mmol/L (ref 3.5–5.2)
Sodium: 138 mmol/L (ref 134–144)
Total Protein: 8.8 g/dL — ABNORMAL HIGH (ref 6.0–8.5)
eGFR: 55 mL/min/{1.73_m2} — ABNORMAL LOW (ref >59)
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
## 2021-08-25 ENCOUNTER — Encounter (HOSPITAL_BASED_OUTPATIENT_CLINIC_OR_DEPARTMENT_OTHER): Payer: Medicare Other | Attending: General Surgery | Admitting: Physician Assistant

## 2021-08-25 DIAGNOSIS — I5042 Chronic combined systolic (congestive) and diastolic (congestive) heart failure: Secondary | ICD-10-CM | POA: Diagnosis not present

## 2021-08-25 DIAGNOSIS — L97812 Non-pressure chronic ulcer of other part of right lower leg with fat layer exposed: Secondary | ICD-10-CM | POA: Insufficient documentation

## 2021-08-25 DIAGNOSIS — I87333 Chronic venous hypertension (idiopathic) with ulcer and inflammation of bilateral lower extremity: Secondary | ICD-10-CM | POA: Diagnosis not present

## 2021-08-25 DIAGNOSIS — N183 Chronic kidney disease, stage 3 unspecified: Secondary | ICD-10-CM | POA: Diagnosis not present

## 2021-08-25 DIAGNOSIS — I890 Lymphedema, not elsewhere classified: Secondary | ICD-10-CM | POA: Insufficient documentation

## 2021-08-25 DIAGNOSIS — I10 Essential (primary) hypertension: Secondary | ICD-10-CM | POA: Diagnosis not present

## 2021-08-25 DIAGNOSIS — L97822 Non-pressure chronic ulcer of other part of left lower leg with fat layer exposed: Secondary | ICD-10-CM | POA: Diagnosis not present

## 2021-08-25 DIAGNOSIS — I130 Hypertensive heart and chronic kidney disease with heart failure and stage 1 through stage 4 chronic kidney disease, or unspecified chronic kidney disease: Secondary | ICD-10-CM | POA: Diagnosis not present

## 2021-08-25 DIAGNOSIS — I872 Venous insufficiency (chronic) (peripheral): Secondary | ICD-10-CM | POA: Insufficient documentation
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
## 2021-08-25 NOTE — Progress Notes (Signed)
JAVALE, ATHANS ([id number]) Visit Report for 08/25/2021 Chief Complaint Document Details Patient Name: Date of Service: [name], [name] 08/25/2021 8:30 A M Medical Record Number: [id number] Patient Account Number: [id number] Date of Birth/Sex: Treating RN: [birth year] (61 y.o. [name] Other Clinician: Referring Provider: Treating Provider/Extender: [name] in Treatment: Small Information Obtained from: Patient Chief Complaint Bilateral Leg Ulcers Electronic Signature(s) Signed: 08/25/2021 9:35:32 AM By: Shawn Keeler Small Entered By: [name] on 08/25/2021 09:35:32 -------------------------------------------------------------------------------- HPI Details Patient Name: Date of Service: [name], [name] 08/25/2021 8:30 A M Medical Record Number: [id number] Patient Account Number: [id number] Date of Birth/Sex: Treating RN: [name] History of Present Illness HPI Description: 02/24/2020 on evaluation today patient presents with obvious chronic lymphedema of the bilateral lower extremities. Fortunately there is no signs of active infection at this time which is great news. With that being said the patient does have a recent treatment with doxycycline 100 mg for 10 days which he has completed. He does have a history of chronic kidney disease stage III as documented in his chart. He also has chronic venous insufficiency with lower extremity lymphedema noted. He has congestive heart failure and hypertension. He tells me currently that he was unaware of the kidney disease before he mentioned this today. He also tells me that his primary care provider had told him to quit taking showers based on what was going on with his legs at this  time. Fortunately there is no signs of active infection at this time which is great news. No fevers, chills, nausea, vomiting, or diarrhea. 03/04/2020 on evaluation today patient actually appears to be doing much better in regard to his legs. There is really not much draining a lot of the dry skin is starting to loosen up amount of time to get as much of this off today as possible 03/11/2020 upon evaluation today patient appears to be doing well currently in regard to his legs. He still has a lot of dry skin some which I was able to get off today but overall I think that the biggest thing he needs right now is to be able to wash his legs in the shower to loosen some of this up which I think will then come off much more effectively and quickly without as much of an issue. Fortunately there is no evidence of active infection at this time. No fevers, chills, nausea, vomiting, or diarrhea. Readmission: 06/05/2020 upon evaluation today patient appears to be doing worse in regard to his bilateral lower extremities. Unfortunately he has had some issues here with his leg started to swell again he tells me he was not able to wear the compression stockings that he got from elastic therapy. He states when he called the leg he said that the measurements did not seem right and subsequently just sent him something she thought would work. Either way he tells me they were very tight and very uncomfortable he just was not able to stick with it. Fortunately there is no signs of active infection at this time. No fevers, chills, nausea, vomiting, or diarrhea. 06/12/2020 on evaluation today patient actually appears to be doing well in regard to his legs. He has a lot of dry skin but nothing that appears to be open at this point. He did not  get the Velcro compression wraps but does have the pull up compression stockings which if he wears those can definitely be sufficient here. Readmission: 08-25-2021 upon evaluation today  patient appears to be doing well with regard to his legs all things considered. With that being said I do not see anything that actively looks like it is open or draining but I definitely see spots where he had blistering and obvious signs of drainage. Subsequently he is supposed to be having open heart surgery to replace a valve that sounds like but they are not able to do that simply due to the fact that right now he is having more issues currently with his legs and they will not do anything until that is resolved. Fortunately I do not see any evidence of active infection locally or systemically which is great news. No fevers, chills, nausea, vomiting, or diarrhea. The patient does have a history noted previously of lymphedema, chronic venous hypertension, chronic kidney disease stage III, congestive heart failure, and hypertension. Electronic Signature(s) Signed: 08/25/2021 4:47:18 PM By: [name] Entered By: [name] on 08/25/2021 16:47:18 -------------------------------------------------------------------------------- Physical Exam Details Patient Name: Date of Service: [name], [name] 08/25/2021 8:30 A M Medical Record Number: [id number] Patient Account Number: [id number] Date of Birth/Sex: Treating RN: January 12, 1961 (61 y.o. [name] Primary Care Provider: Shan Small Other Clinician: Referring Provider: Treating Provider/Extender: [name] in Treatment: Small Constitutional patient is hypertensive.. pulse regular and within target range for patient.Marland Kitchen respirations regular, non-labored and within target range for patient.Marland Kitchen temperature within target range for patient.. Well-nourished and well-hydrated in no acute distress. Eyes conjunctiva clear no eyelid edema noted. pupils equal round and reactive to light and accommodation. Ears, Nose, Mouth, and Throat no gross abnormality of ear auricles or external auditory canals. normal  hearing noted during conversation. mucus membranes moist. Respiratory normal breathing without difficulty. Cardiovascular Absent posterior tibial and dorsalis pedis pulses bilateral lower extremities. Musculoskeletal normal gait and posture. no significant deformity or arthritic changes, no loss or range of motion, no clubbing. Psychiatric this patient is able to make decisions and demonstrates good insight into disease process. Alert and Oriented x 3. pleasant and cooperative. Notes Upon inspection patient's wound actually showed signs of for the most part being pretty much closed although there is definite evidence that he has had some issues here. Again I do think the biggest thing is going to be to get some of his edema under control and try to get the skin quality back in good control as well. He can then start to wear his compression socks and he should be good to go as far as his wounds are concerned and any ongoing issues. With that being said the biggest issue that I have is that he does not have good blood flow into the lower extremities. I do think he needs an arterial study of the left leg in particular showed signs of poor arterial flow. Electronic Signature(s) Signed: 08/25/2021 4:57:43 PM By: [name] Entered By: [name] on 08/25/2021 16:57:43 -------------------------------------------------------------------------------- Physician Orders Details Patient Name: Date of Service: [name], [name] 08/25/2021 8:30 A M Medical Record Number: [id number] Patient Account Number: [id number] Date of Birth/Sex: Treating RN: [birth year] (61 y.o. [name], [name] Primary Care Provider: Shan Small Other Clinician: Referring Provider: Treating Provider/Extender: [name] in Treatment: Small Verbal / Phone Orders: No Diagnosis Coding ICD-10 Coding Code Description I89.Small Lymphedema, not elsewhere  classified I87.333 Chronic venous  hypertension (idiopathic) with ulcer and inflammation of bilateral lower extremity L97.822 Non-pressure chronic ulcer of other part of left lower leg with fat layer exposed L97.812 Non-pressure chronic ulcer of other part of right lower leg with fat layer exposed N18.30 Chronic kidney disease, stage 3 unspecified I50.42 Chronic combined systolic (congestive) and diastolic (congestive) heart failure I10 Essential (primary) hypertension Follow-up Appointments ppointment in 1 week. - w/ Toney Rakes Room # 9 Return A Edema Control - Lymphedema / SCD / Other Elevate legs to the level of the heart or above for 30 minutes daily and/or when sitting, a frequency of: Avoid standing for long periods of time. Wound Treatment Wound #5 - Lower Leg Wound Laterality: Right, Circumferential Cleanser: Soap and Water 1 x Per Week Discharge Instructions: May shower and wash wound with dial antibacterial soap and water prior to dressing change. Peri-Wound Care: Triamcinolone 15 (g) 1 x Per Week Discharge Instructions: Use triamcinolone 15 (g) as directed Peri-Wound Care: Sween Lotion (Moisturizing lotion) 1 x Per Week Discharge Instructions: Apply moisturizing lotion as directed Secondary Dressing: ABD Pad, 5x9 1 x Per Week Discharge Instructions: Apply over primary dressing as directed. Compression Wrap: Kerlix Roll 4.5x3.1 (in/yd) 1 x Per Week Discharge Instructions: Apply Kerlix and Coban compression as directed. Compression Wrap: Coban Self-Adherent Wrap 4x5 (in/yd) 1 x Per Week Discharge Instructions: Apply over Kerlix as directed. Wound #6 - Lower Leg Wound Laterality: Left, Circumferential Cleanser: Soap and Water 1 x Per Week Discharge Instructions: May shower and wash wound with dial antibacterial soap and water prior to dressing change. Peri-Wound Care: Triamcinolone 15 (g) 1 x Per Week Discharge Instructions: Use triamcinolone 15 (g) as directed Peri-Wound Care: Sween Lotion (Moisturizing  lotion) 1 x Per Week Discharge Instructions: Apply moisturizing lotion as directed Secondary Dressing: ABD Pad, 5x9 1 x Per Week Discharge Instructions: Apply over primary dressing as directed. Compression Wrap: Kerlix Roll 4.5x3.1 (in/yd) 1 x Per Week Discharge Instructions: Apply Kerlix and Coban compression as directed. Compression Wrap: Coban Self-Adherent Wrap 4x5 (in/yd) 1 x Per Week Discharge Instructions: Apply over Kerlix as directed. Consults Vascular - for abnormal ABI's on left leg Services and Therapies nkle Brachial Index (ABI) - for abnormal ABI's on left leg A Toe pressures (TBI) Electronic Signature(s) Signed: 08/25/2021 3:02:16 PM By: Rhae Hammock RN Signed: 08/25/2021 5:33:59 PM By: Shawn Keeler Small Entered By: Rhae Hammock on 08/25/2021 09:44:56 -------------------------------------------------------------------------------- Problem List Details Patient Name: Date of Service: [name], [name] 08/25/2021 8:30 A M Medical Record Number: [id number] Patient Account Number: [id number] Date of Birth/Sex: Treating RN: [birth year] (61 y.o. [name] Other Clinician: Referring Provider: Treating Provider/Extender: [name] in Treatment: Small Active Problems ICD-10 Encounter Code Description Active Date MDM Diagnosis I89.Small Lymphedema, not elsewhere classified 08/25/2021 No Yes I87.333 Chronic venous hypertension (idiopathic) with ulcer and inflammation of 08/25/2021 No Yes bilateral lower extremity L97.822 Non-pressure chronic ulcer of other part of left lower leg with fat layer exposed5/24/2023 No Yes L97.812 Non-pressure chronic ulcer of other part of right lower leg with fat layer 08/25/2021 No Yes exposed N18.30 Chronic kidney disease, stage 3 unspecified 08/25/2021 No Yes I50.42 Chronic combined systolic (congestive) and diastolic (congestive) heart failure 08/25/2021 No Yes I10  Essential (primary) hypertension 08/25/2021 No Yes Inactive Problems Resolved Problems Electronic Signature(s) Signed: 08/25/2021 9:35:22 AM By: Shawn Keeler Small Entered By: [name] on 08/25/2021 09:35:22 -------------------------------------------------------------------------------- Progress Note Details Patient  Name: Date of Service: [name], [name] 08/25/2021 8:30 A M Medical Record Number: [id number] Patient Account Number: [id number] Date of Birth/Sex: Treating RN: 05-01-1960 (61 y.o. [name] Other Clinician: Referring Provider: Treating Provider/Extender: [name] in Treatment: Small Subjective Chief Complaint Information obtained from Patient Bilateral Leg Ulcers History of Present Illness (HPI) 02/24/2020 on evaluation today patient presents with obvious chronic lymphedema of the bilateral lower extremities. Fortunately there is no signs of active infection at this time which is great news. With that being said the patient does have a recent treatment with doxycycline 100 mg for 10 days which he has completed. He does have a history of chronic kidney disease stage III as documented in his chart. He also has chronic venous insufficiency with lower extremity lymphedema noted. He has congestive heart failure and hypertension. He tells me currently that he was unaware of the kidney disease before he mentioned this today. He also tells me that his primary care provider had told him to quit taking showers based on what was going on with his legs at this time. Fortunately there is no signs of active infection at this time which is great news. No fevers, chills, nausea, vomiting, or diarrhea. 03/04/2020 on evaluation today patient actually appears to be doing much better in regard to his legs. There is really not much draining a lot of the dry skin is starting to loosen up amount of time to get as much of  this off today as possible 03/11/2020 upon evaluation today patient appears to be doing well currently in regard to his legs. He still has a lot of dry skin some which I was able to get off today but overall I think that the biggest thing he needs right now is to be able to wash his legs in the shower to loosen some of this up which I think will then come off much more effectively and quickly without as much of an issue. Fortunately there is no evidence of active infection at this time. No fevers, chills, nausea, vomiting, or diarrhea. Readmission: 06/05/2020 upon evaluation today patient appears to be doing worse in regard to his bilateral lower extremities. Unfortunately he has had some issues here with his leg started to swell again he tells me he was not able to wear the compression stockings that he got from elastic therapy. He states when he called the leg he said that the measurements did not seem right and subsequently just sent him something she thought would work. Either way he tells me they were very tight and very uncomfortable he just was not able to stick with it. Fortunately there is no signs of active infection at this time. No fevers, chills, nausea, vomiting, or diarrhea. 06/12/2020 on evaluation today patient actually appears to be doing well in regard to his legs. He has a lot of dry skin but nothing that appears to be open at this point. He did not get the Velcro compression wraps but does have the pull up compression stockings which if he wears those can definitely be sufficient here. Readmission: 08-25-2021 upon evaluation today patient appears to be doing well with regard to his legs all things considered. With that being said I do not see anything that actively looks like it is open or draining but I definitely see spots where he had blistering and obvious signs of drainage. Subsequently he is supposed to be having open heart surgery to  replace a valve that sounds like but they  are not able to do that simply due to the fact that right now he is having more issues currently with his legs and they will not do anything until that is resolved. Fortunately I do not see any evidence of active infection locally or systemically which is great news. No fevers, chills, nausea, vomiting, or diarrhea. The patient does have a history noted previously of lymphedema, chronic venous hypertension, chronic kidney disease stage III, congestive heart failure, and hypertension. Patient History Information obtained from Patient. Allergies latex Family History Heart Disease - Mother, Hypertension - Mother, Kidney Disease - Mother, No family history of Cancer, Diabetes, Hereditary Spherocytosis, Lung Disease, Seizures, Stroke, Thyroid Problems, Tuberculosis. Social History Never smoker, Marital Status - Married, Alcohol Use - Never, Drug Use - No History, Caffeine Use - Never. Medical History Eyes Denies history of Cataracts, Glaucoma, Optic Neuritis Ear/Nose/Mouth/Throat Denies history of Chronic sinus problems/congestion, Middle ear problems Hematologic/Lymphatic Denies history of Anemia, Hemophilia, Human Immunodeficiency Virus, Lymphedema, Sickle Cell Disease Respiratory Denies history of Aspiration, Asthma, Chronic Obstructive Pulmonary Disease (COPD), Pneumothorax, Sleep Apnea, Tuberculosis Cardiovascular Patient has history of Congestive Heart Failure, Hypertension Denies history of Angina, Arrhythmia, Coronary Artery Disease, Deep Vein Thrombosis, Hypotension, Myocardial Infarction, Peripheral Arterial Disease, Peripheral Venous Disease, Phlebitis, Vasculitis Gastrointestinal Denies history of Cirrhosis , Colitis, Crohnoos, Hepatitis A, Hepatitis B, Hepatitis C Endocrine Denies history of Type I Diabetes, Type II Diabetes Genitourinary Denies history of End Stage Renal Disease Immunological Denies history of Lupus Erythematosus, Raynaudoos, Scleroderma Integumentary  (Skin) Denies history of History of Burn Musculoskeletal Denies history of Gout, Rheumatoid Arthritis, Osteoarthritis, Osteomyelitis Neurologic Denies history of Dementia, Neuropathy, Quadriplegia, Paraplegia, Seizure Disorder Oncologic Denies history of Received Chemotherapy, Received Radiation Psychiatric Denies history of Anorexia/bulimia, Confinement Anxiety Objective Constitutional patient is hypertensive.. pulse regular and within target range for patient.Marland Kitchen respirations regular, non-labored and within target range for patient.Marland Kitchen temperature within target range for patient.. Well-nourished and well-hydrated in no acute distress. Vitals Time Taken: 8:24 AM, Height: 69 in, Source: Stated, Weight: 215 lbs, Source: Stated, BMI: 31.7, Temperature: 98.1 F, Pulse: 82 bpm, Respiratory Rate: 17 breaths/min, Blood Pressure: 154/109 mmHg. Eyes conjunctiva clear no eyelid edema noted. pupils equal round and reactive to light and accommodation. Ears, Nose, Mouth, and Throat no gross abnormality of ear auricles or external auditory canals. normal hearing noted during conversation. mucus membranes moist. Respiratory normal breathing without difficulty. Cardiovascular Absent posterior tibial and dorsalis pedis pulses bilateral lower extremities. Musculoskeletal normal gait and posture. no significant deformity or arthritic changes, no loss or range of motion, no clubbing. Psychiatric this patient is able to make decisions and demonstrates good insight into disease process. Alert and Oriented x 3. pleasant and cooperative. General Notes: Upon inspection patient's wound actually showed signs of for the most part being pretty much closed although there is definite evidence that he has had some issues here. Again I do think the biggest thing is going to be to get some of his edema under control and try to get the skin quality back in good control as well. He can then start to wear his compression  socks and he should be good to go as far as his wounds are concerned and any ongoing issues. With that being said the biggest issue that I have is that he does not have good blood flow into the lower extremities. I do think he needs an arterial study of the left leg in particular showed signs  of poor arterial flow. Integumentary (Hair, Skin) Wound #5 status is Open. Original cause of wound was Gradually Appeared. The date acquired was: 07/03/2021. The wound is located on the Right,Circumferential Lower Leg. The wound measures 31cm length x 38cm width x Small.1cm depth; 925.199cm^2 area and 92.52cm^3 volume. There is no tunneling or undermining noted. There is a medium amount of serosanguineous drainage noted. The wound margin is distinct with the outline attached to the wound base. There is large (67-100%) red, pink granulation within the wound bed. There is a small (1-33%) amount of necrotic tissue within the wound bed including Adherent Slough. Wound #6 status is Open. Original cause of wound was Gradually Appeared. The date acquired was: 07/03/2021. The wound is located on the Left,Circumferential Lower Leg. The wound measures 31cm length x 32cm width x Small.1cm depth; 779.115cm^2 area and 77.911cm^3 volume. There is no tunneling or undermining noted. There is a medium amount of serosanguineous drainage noted. The wound margin is distinct with the outline attached to the wound base. There is large (67-100%) red, pink granulation within the wound bed. There is a small (1-33%) amount of necrotic tissue within the wound bed including Adherent Slough. Assessment Active Problems ICD-10 Lymphedema, not elsewhere classified Chronic venous hypertension (idiopathic) with ulcer and inflammation of bilateral lower extremity Non-pressure chronic ulcer of other part of left lower leg with fat layer exposed Non-pressure chronic ulcer of other part of right lower leg with fat layer exposed Chronic kidney disease, stage  3 unspecified Chronic combined systolic (congestive) and diastolic (congestive) heart failure Essential (primary) hypertension Plan Follow-up Appointments: Return Appointment in 1 week. - w/ Toney Rakes Room # 9 Edema Control - Lymphedema / SCD / Other: Elevate legs to the level of the heart or above for 30 minutes daily and/or when sitting, a frequency of: Avoid standing for long periods of time. Services and Therapies ordered were: Ankle Brachial Index (ABI) - for abnormal ABI's on left leg, T pressures (TBI) oe Consults ordered were: Vascular - for abnormal ABI's on left leg WOUND #5: - Lower Leg Wound Laterality: Right, Circumferential Cleanser: Soap and Water 1 x Per Week/ Discharge Instructions: May shower and wash wound with dial antibacterial soap and water prior to dressing change. Peri-Wound Care: Triamcinolone 15 (g) 1 x Per Week/ Discharge Instructions: Use triamcinolone 15 (g) as directed Peri-Wound Care: Sween Lotion (Moisturizing lotion) 1 x Per Week/ Discharge Instructions: Apply moisturizing lotion as directed Secondary Dressing: ABD Pad, 5x9 1 x Per Week/ Discharge Instructions: Apply over primary dressing as directed. Com pression Wrap: Kerlix Roll 4.5x3.1 (in/yd) 1 x Per Week/ Discharge Instructions: Apply Kerlix and Coban compression as directed. Com pression Wrap: Coban Self-Adherent Wrap 4x5 (in/yd) 1 x Per Week/ Discharge Instructions: Apply over Kerlix as directed. WOUND #6: - Lower Leg Wound Laterality: Left, Circumferential Cleanser: Soap and Water 1 x Per Week/ Discharge Instructions: May shower and wash wound with dial antibacterial soap and water prior to dressing change. Peri-Wound Care: Triamcinolone 15 (g) 1 x Per Week/ Discharge Instructions: Use triamcinolone 15 (g) as directed Peri-Wound Care: Sween Lotion (Moisturizing lotion) 1 x Per Week/ Discharge Instructions: Apply moisturizing lotion as directed Secondary Dressing: ABD Pad, 5x9 1 x Per  Week/ Discharge Instructions: Apply over primary dressing as directed. Com pression Wrap: Kerlix Roll 4.5x3.1 (in/yd) 1 x Per Week/ Discharge Instructions: Apply Kerlix and Coban compression as directed. Com pression Wrap: Coban Self-Adherent Wrap 4x5 (in/yd) 1 x Per Week/ Discharge Instructions: Apply over Kerlix as  directed. 1. I would recommend currently that we go ahead and continue with the recommendation for compression although we will just have to do something fairly light in order to avoid causing any issues from compression to heavily applied when he does not have good arterial flow. 2. We are going to make a referral for the patient to have arterial studies. I think that this is going to be his best option currently. 3. I am also going to suggest currently if indeed he has any issues with arterial flow they can go ahead and see about what needs to be done from an intervention standpoint patient is in agreement with this plan. We will see patient back for reevaluation in 1 week here in the clinic. If anything worsens or changes patient will contact our office for additional recommendations. Electronic Signature(s) Signed: 08/25/2021 5:25:47 PM By: Shawn Keeler Small Entered By: [name] on 08/25/2021 17:25:46 -------------------------------------------------------------------------------- HxROS Details Patient Name: Date of Service: [name], [name] 08/25/2021 8:30 A M Medical Record Number: [id number] Patient Account Number: [id number] Date of Birth/Sex: Treating RN: 1960-05-26 (61 y.o. [name] Other Clinician: Referring Provider: Treating Provider/Extender: [name] in Treatment: Small Information Obtained From Patient Eyes Medical History: Negative for: Cataracts; Glaucoma; Optic Neuritis Ear/Nose/Mouth/Throat Medical History: Negative for: Chronic sinus problems/congestion; Middle ear  problems Hematologic/Lymphatic Medical History: Negative for: Anemia; Hemophilia; Human Immunodeficiency Virus; Lymphedema; Sickle Cell Disease Respiratory Medical History: Negative for: Aspiration; Asthma; Chronic Obstructive Pulmonary Disease (COPD); Pneumothorax; Sleep Apnea; Tuberculosis Cardiovascular Medical History: Positive for: Congestive Heart Failure; Hypertension Negative for: Angina; Arrhythmia; Coronary Artery Disease; Deep Vein Thrombosis; Hypotension; Myocardial Infarction; Peripheral Arterial Disease; Peripheral Venous Disease; Phlebitis; Vasculitis Gastrointestinal Medical History: Negative for: Cirrhosis ; Colitis; Crohns; Hepatitis A; Hepatitis B; Hepatitis C Endocrine Medical History: Negative for: Type I Diabetes; Type II Diabetes Genitourinary Medical History: Negative for: End Stage Renal Disease Immunological Medical History: Negative for: Lupus Erythematosus; Raynauds; Scleroderma Integumentary (Skin) Medical History: Negative for: History of Burn Musculoskeletal Medical History: Negative for: Gout; Rheumatoid Arthritis; Osteoarthritis; Osteomyelitis Neurologic Medical History: Negative for: Dementia; Neuropathy; Quadriplegia; Paraplegia; Seizure Disorder Oncologic Medical History: Negative for: Received Chemotherapy; Received Radiation Psychiatric Medical History: Negative for: Anorexia/bulimia; Confinement Anxiety Immunizations Pneumococcal Vaccine: Received Pneumococcal Vaccination: No Implantable Devices None Family and Social History Cancer: No; Diabetes: No; Heart Disease: Yes - Mother; Hereditary Spherocytosis: No; Hypertension: Yes - Mother; Kidney Disease: Yes - Mother; Lung Disease: No; Seizures: No; Stroke: No; Thyroid Problems: No; Tuberculosis: No; Never smoker; Marital Status - Married; Alcohol Use: Never; Drug Use: No History; Caffeine Use: Never; Financial Concerns: No; Food, Clothing or Shelter Needs: No; Support System  Lacking: No; Transportation Concerns: No Electronic Signature(s) Signed: 08/25/2021 3:02:16 PM By: Rhae Hammock RN Signed: 08/25/2021 5:33:59 PM By: Shawn Keeler Small Entered By: Rhae Hammock on 08/25/2021 08:04:02 -------------------------------------------------------------------------------- SuperBill Details Patient Name: Date of Service: [name], [name] 08/25/2021 Medical Record Number: [id number] Patient Account Number: [id number] Date of Birth/Sex: Treating RN: [birth year] (61 y.o. [name] Other Clinician: Referring Provider: Treating Provider/Extender: [name] in Treatment: Small Diagnosis Coding ICD-10 Codes Code Description I89.Small Lymphedema, not elsewhere classified I87.333 Chronic venous hypertension (idiopathic) with ulcer and inflammation of bilateral lower extremity L97.822 Non-pressure chronic ulcer of other part of left lower leg with fat layer exposed L97.812 Non-pressure chronic ulcer of other part of right lower leg with fat layer  exposed N18.30 Chronic kidney disease, stage 3 unspecified I50.42 Chronic combined systolic (congestive) and diastolic (congestive) heart failure I10 Essential (primary) hypertension Facility Procedures CPT4 Code: [id number] Description: [phone] - WOUND CARE VISIT-LEV 5 EST PT Modifier: Quantity: 1 Physician Procedures : CPT4 Code Description Modifier [id number] - WC PHYS LEVEL 4 - EST PT ICD-10 Diagnosis Description I89.Small Lymphedema, not elsewhere classified I87.333 Chronic venous hypertension (idiopathic) with ulcer and inflammation of bilateral lower extremity  L97.822 Non-pressure chronic ulcer of other part of left lower leg with fat layer exposed L97.812 Non-pressure chronic ulcer of other part of right lower leg with fat layer exposed Quantity: 1 Electronic Signature(s) Signed: 08/25/2021 5:26:22 PM By: Shawn Keeler Small Previous Signature:  08/25/2021 3:02:16 PM Version By: Rhae Hammock RN Entered By: [name] on 08/25/2021 17:26:22

## 2021-08-25 NOTE — Progress Notes (Signed)
SAMAAD, HASHEM ([id number]) Visit Report for 08/25/2021 Allergy List Details Patient Name: Date of Service: [name], [name] 08/25/2021 8:30 A M Medical Record Number: [id number] Patient Account Number: [id number] Date of Birth/Sex: Treating RN: May 27, 1960 (61 y.o. [name], [name] Primary Care Grethel Zenk: [name] Other Clinician: Referring Danya Spearman: Treating Merrisa Skorupski/Extender: [name] in Treatment: 0 Allergies Active Allergies latex Allergy Notes Electronic Signature(s) Signed: 08/25/2021 3:02:16 PM By: Fonnie Mu RN Entered By: Fonnie Mu on 08/25/2021 08:03:56 -------------------------------------------------------------------------------- Arrival Information Details Patient Name: Date of Service: [name], [name] 08/25/2021 8:30 A M Medical Record Number: [id number] Patient Account Number: [id number] Date of Birth/Sex: Treating RN: 07-Aug-1960 (61 y.o. [name], [name] Primary Care Othell Jaime: [name] Other Clinician: Referring Aima Mcwhirt: Treating Aysel Gilchrest/Extender: [name] in Treatment: 0 Visit Information Patient Arrived: Ambulatory Arrival Time: 08:23 Accompanied By: [name] Transfer Assistance: None Patient Identification Verified: Yes Secondary Verification Process Completed: Yes Patient Requires Transmission-Based Precautions: No Patient Has Alerts: No History Since Last Visit Added or deleted any medications: No Any new allergies or adverse reactions: No Had a fall or experienced change in activities of daily living that may affect risk of falls: No Signs or symptoms of abuse/neglect since last visito No Hospitalized since last visit: No Implantable device outside of the clinic excluding cellular tissue based products placed in the center since last visit: No Has Dressing in Place as Prescribed: Yes Electronic Signature(s) Signed: 08/25/2021 3:02:16 PM By: Fonnie Mu RN Entered By:  Fonnie Mu on 08/25/2021 08:23:52 -------------------------------------------------------------------------------- Clinic Level of Care Assessment Details Patient Name: Date of Service: JHOVANI, GRISWOLD 08/25/2021 8:30 A M Medical Record Number: [id number] Patient Account Number: [id number] Date of Birth/Sex: Treating RN: 04-Aug-1960 (61 y.o. [name], [name] Primary Care Kunta Hilleary: [name] Other Clinician: Referring Zoeya Gramajo: Treating Marsheila Alejo/Extender: [name] in Treatment: 0 Clinic Level of Care Assessment Items TOOL 4 Quantity Score X- 1 0 Use when only an EandM is performed on FOLLOW-UP visit ASSESSMENTS - Nursing Assessment / Reassessment X- 1 10 Reassessment of Co-morbidities (includes updates in patient status) X- 1 5 Reassessment of Adherence to Treatment Plan ASSESSMENTS - Wound and Skin A ssessment / Reassessment []  - 0 Simple Wound Assessment / Reassessment - one wound X- 2 5 Complex Wound Assessment / Reassessment - multiple wounds []  - 0 Dermatologic / Skin Assessment (not related to wound area) ASSESSMENTS - Focused Assessment X- 2 5 Circumferential Edema Measurements - multi extremities []  - 0 Nutritional Assessment / Counseling / Intervention []  - 0 Lower Extremity Assessment (monofilament, tuning fork, pulses) []  - 0 Peripheral Arterial Disease Assessment (using hand held doppler) ASSESSMENTS - Ostomy and/or Continence Assessment and Care []  - 0 Incontinence Assessment and Management []  - 0 Ostomy Care Assessment and Management (repouching, etc.) PROCESS - Coordination of Care []  - 0 Simple Patient / Family Education for ongoing care X- 1 20 Complex (extensive) Patient / Family Education for ongoing care X- 1 10 Staff obtains , Records, T Results / Process Orders est X- 1 10 Staff telephones HHA, Nursing Homes / Clarify orders / etc []  - 0 Routine Transfer to another Facility (non-emergent  condition) []  - 0 Routine Hospital Admission (non-emergent condition) X- 1 15 New Admissions / / Ordering NPWT Apligraf, etc. , []  - 0 Emergency Hospital Admission (emergent condition) []  - 0 Simple Discharge Coordination X- 1 15 Complex (extensive) Discharge Coordination PROCESS - Special Needs []  - 0 Pediatric / Minor  Patient Management  - 0 Isolation Patient Management  - 0 Hearing / Language / Visual special needs  - 0 Assessment of Community assistance (transportation, D/C planning, etc.)  - 0 Additional assistance / Altered mentation  - 0 Support Surface(s) Assessment (bed, cushion, seat, etc.) INTERVENTIONS - Wound Cleansing / Measurement  - 0 Simple Wound Cleansing - one wound X- 2 5 Complex Wound Cleansing - multiple wounds X- 1 5 Wound Imaging (photographs - any number of wounds)  - 0 Wound Tracing (instead of photographs)  - 0 Simple Wound Measurement - one wound X- 1 5 Complex Wound Measurement - multiple wounds INTERVENTIONS - Wound Dressings  - 0 Small Wound Dressing one or multiple wounds X- 2 15 Medium Wound Dressing one or multiple wounds  - 0 Large Wound Dressing one or multiple wounds  - 0 Application of Medications - topical  - 0 Application of Medications - injection INTERVENTIONS - Miscellaneous  - 0 External ear exam  - 0 Specimen Collection (cultures, biopsies, blood, body fluids, etc.)  - 0 Specimen(s) / Culture(s) sent or taken to Lab for analysis  - 0 Patient Transfer (multiple staff / Nurse, adult / Similar devices)  - 0 Simple Staple / Suture removal (25 or less)  - 0 Complex Staple / Suture removal (26 or more)  - 0 Hypo / Hyperglycemic Management (close monitor of Blood Glucose) X- 1 15 Ankle / Brachial Index (ABI) - do not check if billed separately X- 1 5 Vital Signs Has the patient been seen at the hospital within the last three years: Yes Total Score:  175 Level Of Care: New/Established - Level 5 Electronic Signature(s) Signed: 08/25/2021 3:02:16 PM By: Fonnie Mu RN Entered By: Fonnie Mu on 08/25/2021 09:47:57 -------------------------------------------------------------------------------- Encounter Discharge Information Details Patient Name: Date of Service: [name], [name] 08/25/2021 8:30 A M Medical Record Number: [id number] Patient Account Number: [id number] Date of Birth/Sex: Treating RN: [birth year] (61 y.o. [name], [name] Primary Care Tyna Huertas: [name] Other Clinician: Referring Anjolaoluwa Siguenza: Treating Aleecia Tapia/Extender: [name] in Treatment: 0 Encounter Discharge Information Items Discharge Condition: Stable Ambulatory Status: Ambulatory Discharge Destination: Home Transportation: Private Auto Accompanied By: [name] Schedule Follow-up Appointment: Yes Clinical Summary of Care: Patient Declined Electronic Signature(s) Signed: 08/25/2021 3:02:16 PM By: Fonnie Mu RN Entered By: Fonnie Mu on 08/25/2021 09:48:33 -------------------------------------------------------------------------------- Lower Extremity Assessment Details Patient Name: Date of Service: [name], [name] 08/25/2021 8:30 A M Medical Record Number: [id number] Patient Account Number: [id number] Date of Birth/Sex: Treating RN: 26-Aug-1960 (61 y.o. [name], [name] Primary Care Bobi Daudelin: [name] Other Clinician: Referring Zannah Melucci: Treating Jamarius Saha/Extender: [name] in Treatment: 0 Edema Assessment Assessed: [name]: Yes] [name]: Yes] Edema: [Left: Yes] [Right: Yes] Calf Left: Right: Point of Measurement: From Medial Instep 38 cm 37 cm Ankle Left: Right: Point of Measurement: From Medial Instep 27 cm 27 cm Vascular Assessment Pulses: Dorsalis Pedis Palpable: [Left:Yes] [Right:Yes] Posterior Tibial Palpable: [Left:Yes] [Right:Yes] Blood  Pressure: Brachial: [Left:159] [Right:159] Ankle: [Left:Dorsalis Pedis: 100 0.63] [Right:Dorsalis Pedis: 180 1.13] Electronic Signature(s) Signed: 08/25/2021 3:02:16 PM By: Fonnie Mu RN Entered By: Fonnie Mu on 08/25/2021 09:40:38 -------------------------------------------------------------------------------- Multi-Disciplinary Care Plan Details Patient Name: Date of Service: [name], [name] 08/25/2021 8:30 A M Medical Record Number: [id number] Patient Account Number: [id number] Date of Birth/Sex: Treating RN: [birth year] (61 y.o. [name], [name] Primary Care Devontay Celaya: [name] Other Clinician: Referring Valda Christenson: Treating Davied Nocito/Extender: [name] in Treatment: 0 Active Inactive Orientation  to the Wound Care Program Nursing Diagnoses: Knowledge deficit related to the wound healing center program Goals: Patient/caregiver will verbalize understanding of the Wound Healing Center Program Date Initiated: 08/25/2021 Target Resolution Date: 09/08/2021 Goal Status: Active Interventions: Provide education on orientation to the wound center Notes: Wound/Skin Impairment Nursing Diagnoses: Impaired tissue integrity Knowledge deficit related to ulceration/compromised skin integrity Goals: Patient will have a decrease in wound volume by X% from date: (specify in notes) Date Initiated: 08/25/2021 Target Resolution Date: 09/08/2021 Goal Status: Active Patient/caregiver will verbalize understanding of skin care regimen Date Initiated: 08/25/2021 Target Resolution Date: 09/08/2021 Goal Status: Active Ulcer/skin breakdown will have a volume reduction of 30% by week 4 Date Initiated: 08/25/2021 Target Resolution Date: 09/08/2021 Goal Status: Active Interventions: Assess patient/caregiver ability to obtain necessary supplies Assess patient/caregiver ability to perform ulcer/skin care regimen upon admission and as needed Assess ulceration(s)  every visit Notes: Electronic Signature(s) Signed: 08/25/2021 3:02:16 PM By: Fonnie Mu RN Entered By: Fonnie Mu on 08/25/2021 09:41:49 -------------------------------------------------------------------------------- Pain Assessment Details Patient Name: Date of Service: [name], [name] 08/25/2021 8:30 A M Medical Record Number: [id number] Patient Account Number: [id number] Date of Birth/Sex: Treating RN: [birth year] (61 y.o. [name], [name] Primary Care Malon Branton: [name] Other Clinician: Referring Anvitha Hutmacher: Treating Kyara Boxer/Extender: [name] in Treatment: 0 Active Problems Location of Pain Severity and Description of Pain Patient Has Paino Yes Site Locations Pain Location: Pain Location: Pain in Ulcers With Dressing Change: Yes Duration of the Pain. Constant / Intermittento Constant Rate the pain. Current Pain Level: 9 Worst Pain Level: 10 Least Pain Level: 0 Tolerable Pain Level: 9 Character of Pain Describe the Pain: Aching Pain Management and Medication Current Pain Management: Medication: Yes Cold Application: No Rest: No Massage: No Activity: No T.E.N.S.: No Heat Application: No Leg drop or elevation: No Is the Current Pain Management Adequate: Adequate How does your wound impact your activities of daily livingo Sleep: No Bathing: No Appetite: No Relationship With Others: No Bladder Continence: No Emotions: No Bowel Continence: No Work: No Toileting: No Drive: No Dressing: No Hobbies: No Electronic Signature(s) Signed: 08/25/2021 3:02:16 PM By: Fonnie Mu RN Entered By: Fonnie Mu on 08/25/2021 08:27:51 -------------------------------------------------------------------------------- Patient/Caregiver Education Details Patient Name: Date of Service: [name] 5/24/2023andnbsp8:30 A M Medical Record Number: [id number] Patient Account Number: [id number] Date of  Birth/Gender: Treating RN: Nov 07, 1960 (61 y.o. Lucious Groves Primary Care Physician: [name] Other Clinician: Referring Physician: Treating Physician/Extender: [name] in Treatment: 0 Education Assessment Education Provided To: Patient Education Topics Provided Welcome T [facility]: o Methods: Explain/Verbal Responses: Reinforcements needed, State content correctly Electronic Signature(s) Signed: 08/25/2021 3:02:16 PM By: Fonnie Mu RN Entered By: Fonnie Mu on 08/25/2021 09:42:04 -------------------------------------------------------------------------------- Wound Assessment Details Patient Name: Date of Service: [name], [name] 08/25/2021 8:30 A M Medical Record Number: [id number] Patient Account Number: [id number] Date of Birth/Sex: Treating RN: March 27, 1961 (61 y.o. [name], [name] Primary Care Emmy Keng: [name] Other Clinician: Referring Karinda Cabriales: Treating Eldene Plocher/Extender: [name] in Treatment: 0 Wound Status Wound Number: 5 Primary Etiology: Venous Leg Ulcer Wound Location: Right, Circumferential Lower Leg Wound Status: Open Wounding Event: Gradually Appeared Comorbid History: Congestive Heart Failure, Hypertension Date Acquired: 07/03/2021 Weeks Of Treatment: 0 Clustered Wound: No Photos Wound Measurements Length: (cm) 31 Width: (cm) 38 Depth: (cm) 0.1 Area: (cm) 925.199 Volume: (cm) 92.52 % Reduction in Area: 0% % Reduction in Volume: 0% Epithelialization: None Tunneling: No Undermining:  No Wound Description Classification: Full Thickness With Exposed Support Structures Wound Margin: Distinct, outline attached Exudate Amount: Medium Exudate Type: Serosanguineous Exudate Color: red, brown Foul Odor After Cleansing: No Slough/Fibrino Yes Wound Bed Granulation Amount: Large (67-100%) Exposed Structure Granulation Quality: Red, Pink Fascia  Exposed: No Necrotic Amount: Small (1-33%) Fat Layer (Subcutaneous Tissue) Exposed: No Necrotic Quality: Adherent Slough Tendon Exposed: No Muscle Exposed: No Joint Exposed: No Bone Exposed: No Treatment Notes Wound #5 (Lower Leg) Wound Laterality: Right, Circumferential Cleanser Soap and Water Discharge Instruction: May shower and wash wound with dial antibacterial soap and water prior to dressing change. Peri-Wound Care Triamcinolone 15 (g) Discharge Instruction: Use triamcinolone 15 (g) as directed Sween Lotion (Moisturizing lotion) Discharge Instruction: Apply moisturizing lotion as directed Topical Primary Dressing Secondary Dressing ABD Pad, 5x9 Discharge Instruction: Apply over primary dressing as directed. Secured With Compression Wrap Kerlix Roll 4.5x3.1 (in/yd) Discharge Instruction: Apply Kerlix and Coban compression as directed. Coban [name]-Adherent Wrap 4x5 (in/yd) Discharge Instruction: Apply over Kerlix as directed. Compression Stockings Add-Ons Electronic Signature(s) Signed: 08/25/2021 2:56:41 PM By: Shawn Stall RN, BSN Signed: 08/25/2021 3:02:16 PM By: Fonnie Mu RN Entered By: Shawn Stall on 08/25/2021 08:55:06 -------------------------------------------------------------------------------- Wound Assessment Details Patient Name: Date of Service: [name], [name] 08/25/2021 8:30 A M Medical Record Number: [id number] Patient Account Number: [id number] Date of Birth/Sex: Treating RN: 29-Jan-1961 (61 y.o. [name], [name] Primary Care Wessie Shanks: [name] Other Clinician: Referring Ashwika Freels: Treating Terrace Fontanilla/Extender: [name] in Treatment: 0 Wound Status Wound Number: 6 Primary Etiology: Venous Leg Ulcer Wound Location: Left, Circumferential Lower Leg Wound Status: Open Wounding Event: Gradually Appeared Comorbid History: Congestive Heart Failure, Hypertension Date Acquired: 07/03/2021 Weeks Of Treatment:  0 Clustered Wound: No Photos Wound Measurements Length: (cm) 31 Width: (cm) 32 Depth: (cm) 0.1 Area: (cm) 779.115 Volume: (cm) 77.911 Wound Description Classification: Full Thickness With Exposed Support Struct Wound Margin: Distinct, outline attached Exudate Amount: Medium Exudate Type: Serosanguineous Exudate Color: red, brown ures Foul Odor After Cleansing: Slough/Fibrino % Reduction in Area: 0% % Reduction in Volume: 0% Epithelialization: None Tunneling: No Undermining: No No Yes Wound Bed Granulation Amount: Large (67-100%) Exposed Structure Granulation Quality: Red, Pink Fascia Exposed: No Necrotic Amount: Small (1-33%) Fat Layer (Subcutaneous Tissue) Exposed: No Necrotic Quality: Adherent Slough Tendon Exposed: No Muscle Exposed: No Joint Exposed: No Bone Exposed: No Treatment Notes Wound #6 (Lower Leg) Wound Laterality: Left, Circumferential Cleanser Soap and Water Discharge Instruction: May shower and wash wound with dial antibacterial soap and water prior to dressing change. Peri-Wound Care Triamcinolone 15 (g) Discharge Instruction: Use triamcinolone 15 (g) as directed Sween Lotion (Moisturizing lotion) Discharge Instruction: Apply moisturizing lotion as directed Topical Primary Dressing Secondary Dressing ABD Pad, 5x9 Discharge Instruction: Apply over primary dressing as directed. Secured With Compression Wrap Kerlix Roll 4.5x3.1 (in/yd) Discharge Instruction: Apply Kerlix and Coban compression as directed. Coban [name]-Adherent Wrap 4x5 (in/yd) Discharge Instruction: Apply over Kerlix as directed. Compression Stockings Add-Ons Electronic Signature(s) Signed: 08/25/2021 2:56:41 PM By: Shawn Stall RN, BSN Signed: 08/25/2021 3:02:16 PM By: Fonnie Mu RN Entered By: Shawn Stall on 08/25/2021 08:55:20 -------------------------------------------------------------------------------- Vitals Details Patient Name: Date of Service: [name], [name] 08/25/2021 8:30 A M Medical Record Number: [id number] Patient Account Number: [id number] Date of Birth/Sex: Treating RN: 1960-07-19 (61 y.o. [name], [name] Primary Care Trip Cavanagh: [name] Other Clinician: Referring Rhaelyn Giron: Treating Barton Want/Extender: [name] in Treatment: 0 Vital Signs Time Taken: 08:24 Temperature (F): 98.1 Height (in): 69  Pulse (bpm): 82 Source: Stated Respiratory Rate (breaths/min): 17 Weight (lbs): 215 Blood Pressure (mmHg): 154/109 Source: Stated Reference Range: 80 - 120 mg / dl Body Mass Index (BMI): 31.7 Electronic Signature(s) Signed: 08/25/2021 3:02:16 PM By: Fonnie MuBreedlove, [name] RN Entered By: Fonnie MuBreedlove, [name] on 08/25/2021 08:25:56

## 2021-09-01 ENCOUNTER — Encounter: Payer: Self-pay | Admitting: Physician Assistant

## 2021-09-01 ENCOUNTER — Ambulatory Visit (INDEPENDENT_AMBULATORY_CARE_PROVIDER_SITE_OTHER): Payer: Medicare Other | Admitting: Physician Assistant

## 2021-09-01 ENCOUNTER — Encounter (HOSPITAL_BASED_OUTPATIENT_CLINIC_OR_DEPARTMENT_OTHER): Payer: Medicare Other | Admitting: Physician Assistant

## 2021-09-01 VITALS — BP 160/90 | HR 95 | Ht 69.0 in | Wt 201.6 lb

## 2021-09-01 DIAGNOSIS — I890 Lymphedema, not elsewhere classified: Secondary | ICD-10-CM | POA: Diagnosis not present

## 2021-09-01 DIAGNOSIS — I10 Essential (primary) hypertension: Secondary | ICD-10-CM | POA: Diagnosis not present

## 2021-09-01 DIAGNOSIS — L239 Allergic contact dermatitis, unspecified cause: Secondary | ICD-10-CM

## 2021-09-01 DIAGNOSIS — I4819 Other persistent atrial fibrillation: Secondary | ICD-10-CM

## 2021-09-01 DIAGNOSIS — I130 Hypertensive heart and chronic kidney disease with heart failure and stage 1 through stage 4 chronic kidney disease, or unspecified chronic kidney disease: Secondary | ICD-10-CM | POA: Diagnosis not present

## 2021-09-01 DIAGNOSIS — N183 Chronic kidney disease, stage 3 unspecified: Secondary | ICD-10-CM | POA: Diagnosis not present

## 2021-09-01 DIAGNOSIS — L97812 Non-pressure chronic ulcer of other part of right lower leg with fat layer exposed: Secondary | ICD-10-CM | POA: Diagnosis not present

## 2021-09-01 DIAGNOSIS — L97822 Non-pressure chronic ulcer of other part of left lower leg with fat layer exposed: Secondary | ICD-10-CM | POA: Diagnosis not present

## 2021-09-01 DIAGNOSIS — I350 Nonrheumatic aortic (valve) stenosis: Secondary | ICD-10-CM | POA: Diagnosis not present

## 2021-09-01 DIAGNOSIS — Z7901 Long term (current) use of anticoagulants: Secondary | ICD-10-CM

## 2021-09-01 DIAGNOSIS — I87333 Chronic venous hypertension (idiopathic) with ulcer and inflammation of bilateral lower extremity: Secondary | ICD-10-CM | POA: Diagnosis not present

## 2021-09-01 DIAGNOSIS — I5032 Chronic diastolic (congestive) heart failure: Secondary | ICD-10-CM | POA: Diagnosis not present

## 2021-09-01 DIAGNOSIS — I5042 Chronic combined systolic (congestive) and diastolic (congestive) heart failure: Secondary | ICD-10-CM | POA: Diagnosis not present

## 2021-09-01 DIAGNOSIS — G4733 Obstructive sleep apnea (adult) (pediatric): Secondary | ICD-10-CM | POA: Diagnosis not present

## 2021-09-01 DIAGNOSIS — I872 Venous insufficiency (chronic) (peripheral): Secondary | ICD-10-CM | POA: Diagnosis not present

## 2021-09-01 MED ORDER — FUROSEMIDE 40 MG PO TABS
40.0000 mg | ORAL_TABLET | Freq: Three times a day (TID) | ORAL | 3 refills | Status: DC
Start: 2021-09-01 — End: 2022-01-13

## 2021-09-01 MED ORDER — CLONIDINE HCL 0.2 MG PO TABS: 0.2000 mg | ORAL_TABLET | Freq: Three times a day (TID) | ORAL | 3 refills | Status: DC

## 2021-09-01 MED ORDER — APIXABAN 5 MG PO TABS: 5.0000 mg | ORAL_TABLET | Freq: Two times a day (BID) | ORAL | 3 refills | Status: DC

## 2021-09-01 NOTE — Progress Notes (Addendum)
[name], [name] ([id number]) Visit Report for 09/01/2021 Chief Complaint Document Details Patient Name: Date of Service: [name], [name] 09/01/2021 10:45 A M Medical Record Number: [id number] Patient Account Number: [id number] Date of Birth/Sex: Treating RN: [birth year] (61 Small.o. [name], [name] Primary Care Provider: [name] Other Clinician: Referring Provider: Treating Provider/Extender: [name] in Treatment: 1 Information Obtained from: Patient Chief Complaint Bilateral Leg Ulcers Electronic Signature(s) Signed: 09/01/2021 10:46:28 AM By: [name] PA-C Previous Signature: 09/01/2021 10:31:21 AM Version By: [name] PA-C Entered By: [name] on 09/01/2021 10:46:28 -------------------------------------------------------------------------------- HPI Details Patient Name: Date of Service: [name], [name] 09/01/2021 10:45 A M Medical Record Number: [id number] Patient Account Number: [id number] Date of Birth/Sex: Treating RN: December 03, 1960 (29 Small.o. [name], [name] Primary Care Provider: [name] Other Clinician: Referring Provider: Treating Provider/Extender: [name] in Treatment: 1 History of Present Illness HPI Description: 02/24/2020 on evaluation today patient presents with obvious chronic lymphedema of the bilateral lower extremities. Fortunately there is no signs of active infection at this time which is great news. With that being said the patient does have a recent treatment with doxycycline 100 mg for 10 days which he has completed. He does have a history of chronic kidney disease stage III as documented in his chart. He also has chronic venous insufficiency with lower extremity lymphedema noted. He has congestive heart failure and hypertension. He tells me currently that he was unaware of the kidney disease before he mentioned this today. He also tells me that his primary care provider had  told him to quit taking showers based on what was going on with his legs at this time. Fortunately there is no signs of active infection at this time which is great news. No fevers, chills, nausea, vomiting, or diarrhea. 03/04/2020 on evaluation today patient actually appears to be doing much better in regard to his legs. There is really not much draining a lot of the dry skin is starting to loosen up amount of time to get as much of this off today as possible 03/11/2020 upon evaluation today patient appears to be doing well currently in regard to his legs. He still has a lot of dry skin some which I was able to get off today but overall I think that the biggest thing he needs right now is to be able to wash his legs in the shower to loosen some of this up which I think will then come off much more effectively and quickly without as much of an issue. Fortunately there is no evidence of active infection at this time. No fevers, chills, nausea, vomiting, or diarrhea. Readmission: 06/05/2020 upon evaluation today patient appears to be doing worse in regard to his bilateral lower extremities. Unfortunately he has had some issues here with his leg started to swell again he tells me he was not able to wear the compression stockings that he got from elastic therapy. He states when he called the leg he said that the measurements did not seem right and subsequently just sent him something she thought would work. Either way he tells me they were very tight and very uncomfortable he just was not able to stick with it. Fortunately there is no signs of active infection at this time. No fevers, chills, nausea, vomiting, or diarrhea. 06/12/2020 on evaluation today patient actually appears to be doing well in regard to his legs. He has a lot of dry skin but nothing  that appears to be open at this point. He did not get the Velcro compression wraps but does have the pull up compression stockings which if he wears those can  definitely be sufficient here. Readmission: 08-25-2021 upon evaluation today patient appears to be doing well with regard to his legs all things considered. With that being said I do not see anything that actively looks like it is open or draining but I definitely see spots where he had blistering and obvious signs of drainage. Subsequently he is supposed to be having open heart surgery to replace a valve that sounds like but they are not able to do that simply due to the fact that right now he is having more issues currently with his legs and they will not do anything until that is resolved. Fortunately I do not see any evidence of active infection locally or systemically which is great news. No fevers, chills, nausea, vomiting, or diarrhea. The patient does have a history noted previously of lymphedema, chronic venous hypertension, chronic kidney disease stage III, congestive heart failure, and hypertension. 09-01-2021 upon evaluation today patient actually appears to be healed I do not see anything open at this time which is great news. Fortunately he is doing excellent and I think they were on the right track. With that being said there were a couple areas where he was so dry that the wrap actually stuck to his leg and this has caused some issues here with a couple small openings remaining but I think by next week these will be sealed up. Overall I think that he needs some lotion twice a day and Ace wraps are probably to be the best way to go to help with compression for now. He does have the vascular appointment upcoming in a couple of days. Electronic Signature(s) Signed: 09/01/2021 11:56:14 AM By: Lenda KelpStone III, Rathana Viveros PA-C Entered By: Lenda KelpStone III, Francois Elk on 09/01/2021 11:56:13 -------------------------------------------------------------------------------- Physical Exam Details Patient Name: Date of Service: [name], [name] 09/01/2021 10:45 A M Medical Record Number: [id number] Patient Account Number:  [id number] Date of Birth/Sex: Treating RN: 1960/10/09 (61 Small.o. Charlean MerlM) Breedlove, [name] Primary Care Provider: Shan LevansWright, Patrick Other Clinician: Referring Provider: Treating Provider/Extender: Zara ChessStone III, Voris Tigert Wright, Patrick Weeks in Treatment: 1 Constitutional Well-nourished and well-hydrated in no acute distress. Respiratory normal breathing without difficulty. Psychiatric this patient is able to make decisions and demonstrates good insight into disease process. Alert and Oriented x 3. pleasant and cooperative. Notes Upon inspection patient's wound bed actually showed signs of good granulation in the physician at this point. Fortunately I do not see any evidence of infection locally or systemically which is great news and overall extremely pleased with where we stand today. Electronic Signature(s) Signed: 09/01/2021 11:56:32 AM By: Lenda KelpStone III, Peniel Hass PA-C Entered By: Lenda KelpStone III, Nitesh Pitstick on 09/01/2021 11:56:32 -------------------------------------------------------------------------------- Physician Orders Details Patient Name: Date of Service: [name], Nickalos 09/01/2021 10:45 A M Medical Record Number: [id number] Patient Account Number: [id number] Date of Birth/Sex: Treating RN: 1960/10/09 (61 Small.o. Charlean MerlM) Breedlove, [name] Primary Care Provider: Shan LevansWright, Patrick Other Clinician: Referring Provider: Treating Provider/Extender: Zara ChessStone III, Alferd Obryant Wright, Patrick Weeks in Treatment: 1 Verbal / Phone Orders: No Diagnosis Coding ICD-10 Coding Code Description I89.0 Lymphedema, not elsewhere classified I87.333 Chronic venous hypertension (idiopathic) with ulcer and inflammation of bilateral lower extremity L97.822 Non-pressure chronic ulcer of other part of left lower leg with fat layer exposed L97.812 Non-pressure chronic ulcer of other part of right lower leg with fat layer exposed N18.30 Chronic kidney disease,  stage 3 unspecified I50.42 Chronic combined systolic (congestive) and diastolic (congestive)  heart failure I10 Essential (primary) hypertension Follow-up Appointments ppointment in 1 week. - w/ Toney Rakes Room # 9 Return A Edema Control - Lymphedema / SCD / Other Elevate legs to the level of the heart or above for 30 minutes daily and/or when sitting, a frequency of: Avoid standing for long periods of time. Wound Treatment Wound #5 - Lower Leg Wound Laterality: Right, Circumferential Cleanser: Soap and Water 1 x Per Week Discharge Instructions: May shower and wash wound with dial antibacterial soap and water prior to dressing change. Peri-Wound Care: Sween Lotion (Moisturizing lotion) 1 x Per Week Discharge Instructions: Apply moisturizing lotion as directed. You can get Eucerin from [facility]. Secured With: Elastic Bandage 4 inch (ACE bandage) 1 x Per Week Discharge Instructions: Secure with ACE bandage from bottom of toes to just below the bend of your knee for compression. Wound #6 - Lower Leg Wound Laterality: Left, Circumferential Cleanser: Soap and Water 1 x Per Week Discharge Instructions: May shower and wash wound with dial antibacterial soap and water prior to dressing change. Peri-Wound Care: Sween Lotion (Moisturizing lotion) 1 x Per Week Discharge Instructions: Apply moisturizing lotion as directed. You can get Eucerin from [facility]. Secured With: Elastic Bandage 4 inch (ACE bandage) 1 x Per Week Discharge Instructions: Secure with ACE bandage from bottom of toes to just below the bend of your knee for compression. Scientist, research (life sciences)) Signed: 09/01/2021 3:17:18 PM By: Rhae Hammock RN Signed: 09/01/2021 4:11:22 PM By: [name] PA-C Entered By: Rhae Hammock on 09/01/2021 10:38:50 Prescription 09/01/2021 -------------------------------------------------------------------------------- Noralyn Pick PA Patient Name: Provider: 12/18/1960 [id number] Date of Birth: NPI#Jerilynn Mages [id number] Sex: DEA #: [id number] Phone #:  License #: [place] Patient Address: [place] [address] [place], [place] [address] [place], [place] [address] [phone] Allergies latex Provider's Orders Podiatry Hand Signature: Date(s): Electronic Signature(s) Signed: 09/01/2021 3:17:18 PM By: Rhae Hammock RN Signed: 09/01/2021 4:11:22 PM By: [name] PA-C Entered By: Rhae Hammock on 09/01/2021 10:38:51 -------------------------------------------------------------------------------- Problem List Details Patient Name: Date of Service: [name], [name] 09/01/2021 10:45 A M Medical Record Number: [id number] Patient Account Number: [id number] Date of Birth/Sex: Treating RN: [birth year] (31 Small.o. [name], [name] Primary Care Provider: [name] Other Clinician: Referring Provider: Treating Provider/Extender: [name] in Treatment: 1 Active Problems ICD-10 Encounter Code Description Active Date MDM Diagnosis I89.0 Lymphedema, not elsewhere classified 08/25/2021 No Yes I87.333 Chronic venous hypertension (idiopathic) with ulcer and inflammation of 08/25/2021 No Yes bilateral lower extremity L97.822 Non-pressure chronic ulcer of other part of left lower leg with fat layer exposed5/24/2023 No Yes L97.812 Non-pressure chronic ulcer of other part of right lower leg with fat layer 08/25/2021 No Yes exposed N18.30 Chronic kidney disease, stage 3 unspecified 08/25/2021 No Yes I50.42 Chronic combined systolic (congestive) and diastolic (congestive) heart failure 08/25/2021 No Yes I10 Essential (primary) hypertension 08/25/2021 No Yes Inactive Problems Resolved Problems Electronic Signature(s) Signed: 09/01/2021 10:31:16 AM By: [name] PA-C Entered By: [name] on 09/01/2021 10:31:16 -------------------------------------------------------------------------------- Progress Note Details Patient Name: Date of  Service: [name], [name] 09/01/2021 10:45 A M Medical Record Number: [id number] Patient Account Number: [id number] Date of Birth/Sex: Treating RN: 1961-03-17 (36 Small.o. [name], [name] Primary Care Provider: [name] Other Clinician: Referring Provider: Treating Provider/Extender: [name] in Treatment: 1  Subjective Chief Complaint Information obtained from Patient Bilateral Leg Ulcers History of Present Illness (HPI) 02/24/2020 on evaluation today patient presents with obvious chronic lymphedema of the bilateral lower extremities. Fortunately there is no signs of active infection at this time which is great news. With that being said the patient does have a recent treatment with doxycycline 100 mg for 10 days which he has completed. He does have a history of chronic kidney disease stage III as documented in his chart. He also has chronic venous insufficiency with lower extremity lymphedema noted. He has congestive heart failure and hypertension. He tells me currently that he was unaware of the kidney disease before he mentioned this today. He also tells me that his primary care provider had told him to quit taking showers based on what was going on with his legs at this time. Fortunately there is no signs of active infection at this time which is great news. No fevers, chills, nausea, vomiting, or diarrhea. 03/04/2020 on evaluation today patient actually appears to be doing much better in regard to his legs. There is really not much draining a lot of the dry skin is starting to loosen up amount of time to get as much of this off today as possible 03/11/2020 upon evaluation today patient appears to be doing well currently in regard to his legs. He still has a lot of dry skin some which I was able to get off today but overall I think that the biggest thing he needs right now is to be able to wash his legs in the shower to loosen some of this up which I think will  then come off much more effectively and quickly without as much of an issue. Fortunately there is no evidence of active infection at this time. No fevers, chills, nausea, vomiting, or diarrhea. Readmission: 06/05/2020 upon evaluation today patient appears to be doing worse in regard to his bilateral lower extremities. Unfortunately he has had some issues here with his leg started to swell again he tells me he was not able to wear the compression stockings that he got from elastic therapy. He states when he called the leg he said that the measurements did not seem right and subsequently just sent him something she thought would work. Either way he tells me they were very tight and very uncomfortable he just was not able to stick with it. Fortunately there is no signs of active infection at this time. No fevers, chills, nausea, vomiting, or diarrhea. 06/12/2020 on evaluation today patient actually appears to be doing well in regard to his legs. He has a lot of dry skin but nothing that appears to be open at this point. He did not get the Velcro compression wraps but does have the pull up compression stockings which if he wears those can definitely be sufficient here. Readmission: 08-25-2021 upon evaluation today patient appears to be doing well with regard to his legs all things considered. With that being said I do not see anything that actively looks like it is open or draining but I definitely see spots where he had blistering and obvious signs of drainage. Subsequently he is supposed to be having open heart surgery to replace a valve that sounds like but they are not able to do that simply due to the fact that right now he is having more issues currently with his legs and they will not do anything until that is resolved. Fortunately I do not see any evidence of active  infection locally or systemically which is great news. No fevers, chills, nausea, vomiting, or diarrhea. The patient does have a  history noted previously of lymphedema, chronic venous hypertension, chronic kidney disease stage III, congestive heart failure, and hypertension. 09-01-2021 upon evaluation today patient actually appears to be healed I do not see anything open at this time which is great news. Fortunately he is doing excellent and I think they were on the right track. With that being said there were a couple areas where he was so dry that the wrap actually stuck to his leg and this has caused some issues here with a couple small openings remaining but I think by next week these will be sealed up. Overall I think that he needs some lotion twice a day and Ace wraps are probably to be the best way to go to help with compression for now. He does have the vascular appointment upcoming in a couple of days. Objective Constitutional Well-nourished and well-hydrated in no acute distress. Vitals Time Taken: 10:20 AM, Height: 69 in, Weight: 215 lbs, BMI: 31.7, Temperature: 98.7 F, Pulse: 75 bpm, Respiratory Rate: 17 breaths/min, Blood Pressure: 180/96 mmHg. Respiratory normal breathing without difficulty. Psychiatric this patient is able to make decisions and demonstrates good insight into disease process. Alert and Oriented x 3. pleasant and cooperative. General Notes: Upon inspection patient's wound bed actually showed signs of good granulation in the physician at this point. Fortunately I do not see any evidence of infection locally or systemically which is great news and overall extremely pleased with where we stand today. Integumentary (Hair, Skin) Wound #5 status is Open. Original cause of wound was Gradually Appeared. The date acquired was: 07/03/2021. The wound has been in treatment 1 weeks. The wound is located on the Right,Circumferential Lower Leg. The wound measures 0cm length x 0cm width x 0cm depth; 0cm^2 area and 0cm^3 volume. There is a medium amount of serosanguineous drainage noted. The wound margin is  distinct with the outline attached to the wound base. There is large (67-100%) red, pink granulation within the wound bed. There is a small (1-33%) amount of necrotic tissue within the wound bed including Adherent Slough. Wound #6 status is Open. Original cause of wound was Gradually Appeared. The date acquired was: 07/03/2021. The wound has been in treatment 1 weeks. The wound is located on the Left,Circumferential Lower Leg. The wound measures 0cm length x 0cm width x 0cm depth; 0cm^2 area and 0cm^3 volume. There is a medium amount of serosanguineous drainage noted. The wound margin is distinct with the outline attached to the wound base. There is large (67-100%) red, pink granulation within the wound bed. There is a small (1-33%) amount of necrotic tissue within the wound bed including Adherent Slough. Assessment Active Problems ICD-10 Lymphedema, not elsewhere classified Chronic venous hypertension (idiopathic) with ulcer and inflammation of bilateral lower extremity Non-pressure chronic ulcer of other part of left lower leg with fat layer exposed Non-pressure chronic ulcer of other part of right lower leg with fat layer exposed Chronic kidney disease, stage 3 unspecified Chronic combined systolic (congestive) and diastolic (congestive) heart failure Essential (primary) hypertension Plan Follow-up Appointments: Return Appointment in 1 week. - w/ Toney Rakes Room # 9 Edema Control - Lymphedema / SCD / Other: Elevate legs to the level of the heart or above for 30 minutes daily and/or when sitting, a frequency of: Avoid standing for long periods of time. Consults ordered were: Podiatry WOUND #5: - Lower Leg Wound  Laterality: Right, Circumferential Cleanser: Soap and Water 1 x Per Week/ Discharge Instructions: May shower and wash wound with dial antibacterial soap and water prior to dressing change. Peri-Wound Care: Sween Lotion (Moisturizing lotion) 1 x Per Week/ Discharge  Instructions: Apply moisturizing lotion as directed. You can get Eucerin from [facility]. Secured With: Elastic Bandage 4 inch (ACE bandage) 1 x Per Week/ Discharge Instructions: Secure with ACE bandage from bottom of toes to just below the bend of your knee for compression. WOUND #6: - Lower Leg Wound Laterality: Left, Circumferential Cleanser: Soap and Water 1 x Per Week/ Discharge Instructions: May shower and wash wound with dial antibacterial soap and water prior to dressing change. Peri-Wound Care: Sween Lotion (Moisturizing lotion) 1 x Per Week/ Discharge Instructions: Apply moisturizing lotion as directed. You can get Eucerin from [facility]. Secured With: Elastic Bandage 4 inch (ACE bandage) 1 x Per Week/ Discharge Instructions: Secure with ACE bandage from bottom of toes to just below the bend of your knee for compression. 1. I would recommend him going to continue with the wound care measures as before and the patient is in agreement with plan this includes the use of the Ace wraps bilaterally that should help with compression. 2. Most of the recommend lotion to be applied bilaterally which I think will also be beneficial to help with some of the dry skin. 3. The patient does have a vascular appointment this week for arterial study with ABI and TBI once we get that result he will likely be ready for discharge next week with recommendations for what to do from a compression standpoint. We will see patient back for reevaluation in 1 week here in the clinic. If anything worsens or changes patient will contact our office for additional recommendations. Electronic Signature(s) Signed: 09/01/2021 11:57:24 AM By: [name] PA-C Entered By: [name] on 09/01/2021 11:57:24 -------------------------------------------------------------------------------- SuperBill Details Patient Name: Date of Service: [name], [name] 09/01/2021 Medical Record Number: [id number] Patient Account Number:  [id number] Date of Birth/Sex: Treating RN: 08/24/1960 (89 Small.o. [name], [name] Primary Care Provider: [name] Other Clinician: Referring Provider: Treating Provider/Extender: [name] in Treatment: 1 Diagnosis Coding ICD-10 Codes Code Description I89.0 Lymphedema, not elsewhere classified I87.333 Chronic venous hypertension (idiopathic) with ulcer and inflammation of bilateral lower extremity L97.822 Non-pressure chronic ulcer of other part of left lower leg with fat layer exposed L97.812 Non-pressure chronic ulcer of other part of right lower leg with fat layer exposed N18.30 Chronic kidney disease, stage 3 unspecified I50.42 Chronic combined systolic (congestive) and diastolic (congestive) heart failure I10 Essential (primary) hypertension Facility Procedures CPT4 Code: [id number] Description: 99214 - WOUND CARE VISIT-LEV 4 EST PT Modifier: Quantity: 1 Physician Procedures : CPT4 Code Description Modifier [id number] - WC PHYS LEVEL 3 - EST PT ICD-10 Diagnosis Description I89.0 Lymphedema, not elsewhere classified I87.333 Chronic venous hypertension (idiopathic) with ulcer and inflammation of bilateral lower extremity  L97.822 Non-pressure chronic ulcer of other part of left lower leg with fat layer exposed L97.812 Non-pressure chronic ulcer of other part of right lower leg with fat layer exposed Quantity: 1 Electronic Signature(s) Signed: 09/01/2021 1:06:57 PM By: [name] PA-C Entered By: [name] on 09/01/2021 13:06:57

## 2021-09-01 NOTE — Patient Instructions (Signed)
Medication Instructions:  Your physician recommends that you continue on your current medications as directed. Please refer to the Current Medication list given to you today.  *If you need a refill on your cardiac medications before your next appointment, please call your pharmacy*  Lab Work: NONE ordered at this time of appointment   If you have labs (blood work) drawn today and your tests are completely normal, you will receive your results only by: MyChart Message (if you have MyChart) OR A paper copy in the mail If you have any lab test that is abnormal or we need to change your treatment, we will call you to review the results.  Testing/Procedures: NONE ordered at this time of appointment   Follow-Up: At [facility], you and your health needs are our priority.  As part of our continuing mission to provide you with exceptional heart care, we have created designated Provider Care Teams.  These Care Teams include your primary Cardiologist (physician) and Advanced Practice Providers (APPs -  Physician Assistants and Nurse Practitioners) who all work together to provide you with the care you need, when you need it.     Your next appointment:   3 month(s)  The format for your next appointment:   In Person  Provider:   Little Ishikawa, MD     Other Instructions Monitor blood pressure at home daily 2 hours after morning medications. If blood pressure is consistently greater than 130/80 PLEASE give our office a call. Goal is to be 130/80 or less.  May use over the counter Eucerin Cream, VaniCream and Hydrocortisone cream for the rash. If rash has not improved office a call for Dermatology referral    Important Information About Sugar

## 2021-09-01 NOTE — Progress Notes (Signed)
Pamalee LeydenRAY, Tayvian ([id number]) Visit Report for 09/01/2021 Arrival Information Details Patient Name: Date of Service: Bobbe MedicoRA Y, Itay 09/01/2021 10:45 A M Medical Record Number: [id number] Patient Account Number: [id number] Date of Birth/Sex: Treating RN: [birth year] (61 y.o. Charlean MerlM) Breedlove, [name] Primary Care Andra Heslin: Shan LevansWright, Patrick Other Clinician: Referring Marshawn Normoyle: Treating Adalyn Pennock/Extender: Zara ChessStone III, Hoyt Wright, Patrick Weeks in Treatment: 1 Visit Information History Since Last Visit Added or deleted any medications: No Patient Arrived: Gilmer MorCane Any new allergies or adverse reactions: No Arrival Time: 10:19 Had a fall or experienced change in No Accompanied By: self activities of daily living that may affect Transfer Assistance: Manual risk of falls: Patient Identification Verified: Yes Signs or symptoms of abuse/neglect since last visito No Secondary Verification Process Completed: Yes Hospitalized since last visit: No Patient Requires Transmission-Based Precautions: No Implantable device outside of the clinic excluding No Patient Has Alerts: No cellular tissue based products placed in the center since last visit: Has Dressing in Place as Prescribed: Yes Pain Present Now: Yes Electronic Signature(s) Signed: 09/01/2021 3:17:18 PM By: Fonnie MuBreedlove, [name] RN Entered By: Fonnie MuBreedlove, [name] on 09/01/2021 10:20:07 -------------------------------------------------------------------------------- Clinic Level of Care Assessment Details Patient Name: Date of Service: Bobbe MedicoRA Y, Semir 09/01/2021 10:45 A M Medical Record Number: [id number] Patient Account Number: [id number] Date of Birth/Sex: Treating RN: [birth year] (61 y.o. Charlean MerlM) Breedlove, [name] Primary Care Arie Powell: Shan LevansWright, Patrick Other Clinician: Referring Laval Cafaro: Treating Janthony Holleman/Extender: Zara ChessStone III, Hoyt Wright, Patrick Weeks in Treatment: 1 Clinic Level of Care Assessment Items TOOL 4 Quantity Score X- 1 0 Use when only an EandM is  performed on FOLLOW-UP visit ASSESSMENTS - Nursing Assessment / Reassessment X- 1 10 Reassessment of Co-morbidities (includes updates in patient status) X- 1 5 Reassessment of Adherence to Treatment Plan ASSESSMENTS - Wound and Skin A ssessment / Reassessment []  - 0 Simple Wound Assessment / Reassessment - one wound X- 2 5 Complex Wound Assessment / Reassessment - multiple wounds []  - 0 Dermatologic / Skin Assessment (not related to wound area) ASSESSMENTS - Focused Assessment X- 2 5 Circumferential Edema Measurements - multi extremities []  - 0 Nutritional Assessment / Counseling / Intervention []  - 0 Lower Extremity Assessment (monofilament, tuning fork, pulses) []  - 0 Peripheral Arterial Disease Assessment (using hand held doppler) ASSESSMENTS - Ostomy and/or Continence Assessment and Care []  - 0 Incontinence Assessment and Management []  - 0 Ostomy Care Assessment and Management (repouching, etc.) PROCESS - Coordination of Care []  - 0 Simple Patient / Family Education for ongoing care X- 1 20 Complex (extensive) Patient / Family Education for ongoing care X- 1 10 Staff obtains ChiropractorConsents, Records, T Results / Process Orders est []  - 0 Staff telephones HHA, Nursing Homes / Clarify orders / etc []  - 0 Routine Transfer to another Facility (non-emergent condition) []  - 0 Routine Hospital Admission (non-emergent condition) []  - 0 New Admissions / Manufacturing engineernsurance Authorizations / Ordering NPWT Apligraf, etc. , []  - 0 Emergency Hospital Admission (emergent condition) X- 1 10 Simple Discharge Coordination []  - 0 Complex (extensive) Discharge Coordination PROCESS - Special Needs []  - 0 Pediatric / Minor Patient Management []  - 0 Isolation Patient Management []  - 0 Hearing / Language / Visual special needs []  - 0 Assessment of Community assistance (transportation, D/C planning, etc.) []  - 0 Additional assistance / Altered mentation []  - 0 Support Surface(s) Assessment  (bed, cushion, seat, etc.) INTERVENTIONS - Wound Cleansing / Measurement []  - 0 Simple Wound Cleansing - one wound X- 2 5 Complex Wound Cleansing - multiple wounds X- 1 5  Wound Imaging (photographs - any number of wounds) []  - 0 Wound Tracing (instead of photographs) []  - 0 Simple Wound Measurement - one wound X- 2 5 Complex Wound Measurement - multiple wounds INTERVENTIONS - Wound Dressings []  - 0 Small Wound Dressing one or multiple wounds X- 2 15 Medium Wound Dressing one or multiple wounds []  - 0 Large Wound Dressing one or multiple wounds X- 1 5 Application of Medications - topical []  - 0 Application of Medications - injection INTERVENTIONS - Miscellaneous []  - 0 External ear exam []  - 0 Specimen Collection (cultures, biopsies, blood, body fluids, etc.) []  - 0 Specimen(s) / Culture(s) sent or taken to Lab for analysis []  - 0 Patient Transfer (multiple staff / / Similar devices) []  - 0 Simple Staple / Suture removal (25 or less) []  - 0 Complex Staple / Suture removal (26 or more) []  - 0 Hypo / Hyperglycemic Management (close monitor of Blood Glucose) []  - 0 Ankle / Brachial Index (ABI) - do not check if billed separately X- 1 5 Vital Signs Has the patient been seen at the hospital within the last three years: Yes Total Score: 140 Level Of Care: New/Established - Level 4 Electronic Signature(s) Signed: 09/01/2021 3:17:18 PM By: RN Entered By: on 09/01/2021 10:39:55 -------------------------------------------------------------------------------- Encounter Discharge Information Details Patient Name: Date of Service: ALDEAN, PIPE 09/01/2021 10:45 A M Medical Record Number: Patient Account Number: Date of Birth/Sex: Treating RN: October 26, 1960 (61 y.o. , [name] Primary Care Linzy Laury: Other Clinician: Referring Neeta Storey: Treating Sophia Sperry/Extender: in Treatment: 1 Encounter Discharge Information Items Discharge Condition: Stable Ambulatory Status: Cane Discharge Destination: Home Transportation: Private Auto Accompanied By: self Schedule Follow-up Appointment: Yes Clinical Summary of Care: Patient Declined Electronic Signature(s) Signed: 09/01/2021 3:17:18 PM By: Fonnie Mu RN Entered By: Fonnie Mu on 09/01/2021 10:40:39 -------------------------------------------------------------------------------- Lower Extremity Assessment Details Patient Name: Date of Service: DEMETRI, GOSHERT 09/01/2021 10:45 A M Medical Record Number: [id number] Patient Account Number: [id number] Date of Birth/Sex: Treating RN: July 29, 1960 (61 y.o. [name], [name] Primary Care Rhondalyn Clingan: [name] Other Clinician: Referring Ascher Schroepfer: Treating Azarius Lambson/Extender: [name] in Treatment: 1 Edema Assessment Assessed: 09/03/2021: Yes] Fonnie Mu: Yes] Edema: [Left: Yes] [Right: Yes] Calf Left: Right: Point of Measurement: From Medial Instep 38 cm 37 cm Ankle Left: Right: Point of Measurement: From Medial Instep 27 cm 27 cm Vascular Assessment Pulses: Dorsalis Pedis Palpable: [Left:Yes] Posterior Tibial Palpable: [Left:Yes Yes] Electronic Signature(s) Signed: 09/01/2021 3:17:18 PM By: 09/03/2021 RN Entered By: Bobbe Medico on 09/01/2021 10:30:09 -------------------------------------------------------------------------------- Multi-Disciplinary Care Plan Details Patient Name: Date of Service: CAYCE, QUEZADA 09/01/2021 10:45 A M Medical Record Number: 01/04/1961 Patient Account Number: [id number] Date of Birth/Sex: Treating RN: Jan 24, 1961 (61 y.o. [name], [name] Primary Care Queena Monrreal: [name] Other Clinician: Referring Montel Vanderhoof: Treating Rayonna Heldman/Extender: [name] in Treatment: 1 Active Inactive Electronic Signature(s) Signed: 09/01/2021  3:17:18 PM By: Fonnie Mu RN Entered By: Fonnie Mu on 09/01/2021 10:39:07 -------------------------------------------------------------------------------- Pain Assessment Details Patient Name: Date of Service: MATTHIEU, LOFTUS 09/01/2021 10:45 A M Medical Record Number: [id number] Patient Account Number: [id number] Date of Birth/Sex: Treating RN: 1960-09-26 (61 y.o. [name], [name] Primary Care Voshon Petro: [name] Other Clinician: Referring Ethelmae Ringel: Treating Laconya Clere/Extender: [name] in Treatment: 1 Active Problems Location of Pain Severity and Description of Pain Patient Has Paino Yes Site Locations Pain Location:  Pain Location: Pain in Ulcers With Dressing Change: Yes Duration of the Pain. Constant / Intermittento Intermittent Rate the pain. Current Pain Level: 10 Worst Pain Level: 10 Least Pain Level: 0 Tolerable Pain Level: 10 Character of Pain Describe the Pain: Aching Pain Management and Medication Current Pain Management: Medication: No Cold Application: No Rest: No Massage: No Activity: No T.E.N.S.: No Heat Application: No Leg drop or elevation: No Is the Current Pain Management Adequate: Adequate How does your wound impact your activities of daily livingo Sleep: No Bathing: No Appetite: No Relationship With Others: No Bladder Continence: No Emotions: No Bowel Continence: No Work: No Toileting: No Drive: No Dressing: No Hobbies: No Electronic Signature(s) Signed: 09/01/2021 3:17:18 PM By: Fonnie Mu RN Entered By: Fonnie Mu on 09/01/2021 10:20:44 -------------------------------------------------------------------------------- Patient/Caregiver Education Details Patient Name: Date of Service: Bobbe Medico 5/31/2023andnbsp10:45 A M Medical Record Number: [id number] Patient Account Number: [id number] Date of Birth/Gender: Treating RN: 12-10-1960 (61 y.o. [name],  [name] Primary Care Physician: [name] Other Clinician: Referring Physician: Treating Physician/Extender: [name] in Treatment: 1 Education Assessment Education Provided To: Patient Education Topics Provided Wound/Skin Impairment: Methods: Explain/Verbal Responses: Reinforcements needed, State content correctly Electronic Signature(s) Signed: 09/01/2021 3:17:18 PM By: Fonnie Mu RN Entered By: Fonnie Mu on 09/01/2021 10:39:17 -------------------------------------------------------------------------------- Wound Assessment Details Patient Name: Date of Service: DELAND, SLOCUMB 09/01/2021 10:45 A M Medical Record Number: [id number] Patient Account Number: [id number] Date of Birth/Sex: Treating RN: 01-25-1961 (61 y.o. [name], [name] Primary Care Harsh Trulock: [name] Other Clinician: Referring Sharlet Notaro: Treating Syrina Wake/Extender: [name] in Treatment: 1 Wound Status Wound Number: 5 Primary Etiology: Venous Leg Ulcer Wound Location: Right, Circumferential Lower Leg Wound Status: Open Wounding Event: Gradually Appeared Comorbid History: Congestive Heart Failure, Hypertension Date Acquired: 07/03/2021 Weeks Of Treatment: 1 Clustered Wound: No Photos Wound Measurements Length: (cm) Width: (cm) Depth: (cm) Area: (cm) Volume: (cm) 0 % Reduction in Area: 100% 0 % Reduction in Volume: 100% 0 Epithelialization: None 0 0 Wound Description Classification: Full Thickness With Exposed Support Structures Wound Margin: Distinct, outline attached Exudate Amount: Medium Exudate Type: Serosanguineous Exudate Color: red, brown Foul Odor After Cleansing: No Slough/Fibrino Yes Wound Bed Granulation Amount: Large (67-100%) Exposed Structure Granulation Quality: Red, Pink Fascia Exposed: No Necrotic Amount: Small (1-33%) Fat Layer (Subcutaneous Tissue) Exposed: No Necrotic Quality:  Adherent Slough Tendon Exposed: No Muscle Exposed: No Joint Exposed: No Bone Exposed: No Treatment Notes Wound #5 (Lower Leg) Wound Laterality: Right, Circumferential Cleanser Soap and Water Discharge Instruction: May shower and wash wound with dial antibacterial soap and water prior to dressing change. Peri-Wound Care Sween Lotion (Moisturizing lotion) Discharge Instruction: Apply moisturizing lotion as directed. You can get Eucerin from [facility]. Topical Primary Dressing Secondary Dressing Secured With Elastic Bandage 4 inch (ACE bandage) Discharge Instruction: Secure with ACE bandage from bottom of toes to just below the bend of your knee for compression. Compression Wrap Compression Stockings Add-Ons Electronic Signature(s) Signed: 09/01/2021 3:17:18 PM By: Fonnie Mu RN Entered By: Fonnie Mu on 09/01/2021 10:32:50 -------------------------------------------------------------------------------- Wound Assessment Details Patient Name: Date of Service: JAYLAN, HINOJOSA 09/01/2021 10:45 A M Medical Record Number: [id number] Patient Account Number: [id number] Date of Birth/Sex: Treating RN: 12-18-1960 (61 y.o. [name], [name] Primary Care Daneen Volcy: [name] Other Clinician: Referring Aayush Gelpi: Treating Oluwadara Gorman/Extender: [name] in Treatment: 1 Wound Status Wound Number: 6 Primary Etiology: Venous Leg Ulcer Wound Location: Left, Circumferential Lower Leg Wound Status: Open  Wounding Event: Gradually Appeared Comorbid History: Congestive Heart Failure, Hypertension Date Acquired: 07/03/2021 Weeks Of Treatment: 1 Clustered Wound: No Photos Wound Measurements Length: (cm) Width: (cm) Depth: (cm) Area: (cm) Volume: (cm) 0 % Reduction in Area: 100% 0 % Reduction in Volume: 100% 0 Epithelialization: None 0 0 Wound Description Classification: Full Thickness With Exposed Support Structures Wound Margin: Distinct,  outline attached Exudate Amount: Medium Exudate Type: Serosanguineous Exudate Color: red, brown Foul Odor After Cleansing: No Slough/Fibrino Yes Wound Bed Granulation Amount: Large (67-100%) Exposed Structure Granulation Quality: Red, Pink Fascia Exposed: No Necrotic Amount: Small (1-33%) Fat Layer (Subcutaneous Tissue) Exposed: No Necrotic Quality: Adherent Slough Tendon Exposed: No Muscle Exposed: No Joint Exposed: No Bone Exposed: No Treatment Notes Wound #6 (Lower Leg) Wound Laterality: Left, Circumferential Cleanser Soap and Water Discharge Instruction: May shower and wash wound with dial antibacterial soap and water prior to dressing change. Peri-Wound Care Sween Lotion (Moisturizing lotion) Discharge Instruction: Apply moisturizing lotion as directed. You can get Eucerin from [facility]. Topical Primary Dressing Secondary Dressing Secured With Elastic Bandage 4 inch (ACE bandage) Discharge Instruction: Secure with ACE bandage from bottom of toes to just below the bend of your knee for compression. Compression Wrap Compression Stockings Add-Ons Electronic Signature(s) Signed: 09/01/2021 3:17:18 PM By: Fonnie Mu RN Entered By: Fonnie Mu on 09/01/2021 10:33:05 -------------------------------------------------------------------------------- Vitals Details Patient Name: Date of Service: SAAGAR, TORTORELLA 09/01/2021 10:45 A M Medical Record Number: [id number] Patient Account Number: [id number] Date of Birth/Sex: Treating RN: [birth year] (61 y.o. [name], [name] Primary Care Enrico Eaddy: [name] Other Clinician: Referring Alexyss Balzarini: Treating Donita Newland/Extender: [name] in Treatment: 1 Vital Signs Time Taken: 10:20 Temperature (F): 98.7 Height (in): 69 Pulse (bpm): 75 Weight (lbs): 215 Respiratory Rate (breaths/min): 17 Body Mass Index (BMI): 31.7 Blood Pressure (mmHg): 180/96 Reference Range: 80 - 120 mg /  dl Electronic Signature(s) Signed: 09/01/2021 3:17:18 PM By: Fonnie Mu RN Entered By: Fonnie Mu on 09/01/2021 10:20:21

## 2021-09-02 ENCOUNTER — Ambulatory Visit (INDEPENDENT_AMBULATORY_CARE_PROVIDER_SITE_OTHER): Payer: Medicare Other | Admitting: Cardiology

## 2021-09-02 ENCOUNTER — Other Ambulatory Visit (HOSPITAL_COMMUNITY): Payer: Self-pay | Admitting: Physician Assistant

## 2021-09-02 ENCOUNTER — Encounter: Payer: Self-pay | Admitting: Cardiology

## 2021-09-02 ENCOUNTER — Ambulatory Visit (INDEPENDENT_AMBULATORY_CARE_PROVIDER_SITE_OTHER)
Admission: RE | Admit: 2021-09-02 | Discharge: 2021-09-02 | Disposition: A | Discharge status: Home / Self Care | Payer: Medicare Other | Source: Ambulatory Visit | Attending: Physician Assistant | Admitting: Physician Assistant

## 2021-09-02 ENCOUNTER — Ambulatory Visit (HOSPITAL_COMMUNITY)
Admission: RE | Admit: 2021-09-02 | Discharge: 2021-09-02 | Disposition: A | Discharge status: Home / Self Care | Payer: Medicare Other | Source: Ambulatory Visit | Attending: Physician Assistant | Admitting: Physician Assistant

## 2021-09-02 VITALS — BP 136/88 | HR 95 | Ht 69.0 in | Wt 203.0 lb

## 2021-09-02 DIAGNOSIS — I10 Essential (primary) hypertension: Secondary | ICD-10-CM

## 2021-09-02 DIAGNOSIS — G4733 Obstructive sleep apnea (adult) (pediatric): Secondary | ICD-10-CM

## 2021-09-02 DIAGNOSIS — I739 Peripheral vascular disease, unspecified: Secondary | ICD-10-CM
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
## 2021-09-02 NOTE — Addendum Note (Signed)
Addended by: Theresia Majors on: 09/02/2021 10:11 AM   Modules accepted: Orders

## 2021-09-02 NOTE — Patient Instructions (Signed)
Medication Instructions:  Your physician recommends that you continue on your current medications as directed. Please refer to the Current Medication list given to you today.  *If you need a refill on your cardiac medications before your next appointment, please call your pharmacy*   Testing/Procedures: Your provider has recommended that you have a CPAP titration. We will be in contact with you to get this scheduled once we receive authorization from your insurance.   Follow-Up: At [facility], you and your health needs are our priority.  As part of our continuing mission to provide you with exceptional heart care, we have created designated Provider Care Teams.  These Care Teams include your primary Cardiologist (physician) and Advanced Practice Providers (APPs -  Physician Assistants and Nurse Practitioners) who all work together to provide you with the care you need, when you need it.  Follow up with Dr. Mayford Knife after CPAP titration   Important Information About Sugar

## 2021-09-08 ENCOUNTER — Encounter (HOSPITAL_BASED_OUTPATIENT_CLINIC_OR_DEPARTMENT_OTHER): Payer: Medicare Other | Attending: Internal Medicine | Admitting: Internal Medicine

## 2021-09-08 DIAGNOSIS — I87333 Chronic venous hypertension (idiopathic) with ulcer and inflammation of bilateral lower extremity: Secondary | ICD-10-CM | POA: Insufficient documentation

## 2021-09-08 DIAGNOSIS — I130 Hypertensive heart and chronic kidney disease with heart failure and stage 1 through stage 4 chronic kidney disease, or unspecified chronic kidney disease: Secondary | ICD-10-CM | POA: Insufficient documentation

## 2021-09-08 DIAGNOSIS — L97812 Non-pressure chronic ulcer of other part of right lower leg with fat layer exposed: Secondary | ICD-10-CM | POA: Insufficient documentation

## 2021-09-08 DIAGNOSIS — I890 Lymphedema, not elsewhere classified: Secondary | ICD-10-CM | POA: Insufficient documentation

## 2021-09-08 DIAGNOSIS — L97822 Non-pressure chronic ulcer of other part of left lower leg with fat layer exposed: Secondary | ICD-10-CM | POA: Diagnosis not present

## 2021-09-08 DIAGNOSIS — N183 Chronic kidney disease, stage 3 unspecified: Secondary | ICD-10-CM | POA: Diagnosis not present

## 2021-09-08 DIAGNOSIS — I70201 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, right leg: Secondary | ICD-10-CM | POA: Diagnosis not present

## 2021-09-08 DIAGNOSIS — I5042 Chronic combined systolic (congestive) and diastolic (congestive) heart failure: Secondary | ICD-10-CM | POA: Diagnosis not present

## 2021-09-08 DIAGNOSIS — I70248 Atherosclerosis of native arteries of left leg with ulceration of other part of lower left leg: Secondary | ICD-10-CM | POA: Diagnosis not present

## 2021-09-09 ENCOUNTER — Encounter (HOSPITAL_BASED_OUTPATIENT_CLINIC_OR_DEPARTMENT_OTHER): Payer: Medicare Other | Admitting: Critical Care Medicine

## 2021-09-09 DIAGNOSIS — L03119 Cellulitis of unspecified part of limb: Secondary | ICD-10-CM

## 2021-09-09 MED ORDER — GABAPENTIN 400 MG PO CAPS: 400.0000 mg | ORAL_CAPSULE | Freq: Three times a day (TID) | ORAL | 2 refills | Status: DC

## 2021-09-09 NOTE — Progress Notes (Signed)
[name], [name] ([id number]) Visit Report for 09/08/2021 HPI Details Patient Name: Date of Service: [name], [name] 09/08/2021 1:45 PM Medical Record Number: [id number] Patient Account Number: [id number] Date of Birth/Sex: Treating RN: [birth year] (61 y.o. [name], [name] Primary Care Provider: [name] Other Clinician: Referring Provider: Treating Provider/Extender: Maxwell Marion in Treatment: 2 History of Present Illness HPI Description: 02/24/2020 on evaluation today patient presents with obvious chronic lymphedema of the bilateral lower extremities. Fortunately there is no signs of active infection at this time which is great news. With that being said the patient does have a recent treatment with doxycycline 100 mg for 10 days which he has completed. He does have a history of chronic kidney disease stage III as documented in his chart. He also has chronic venous insufficiency with lower extremity lymphedema noted. He has congestive heart failure and hypertension. He tells me currently that he was unaware of the kidney disease before he mentioned this today. He also tells me that his primary care provider had told him to quit taking showers based on what was going on with his legs at this time. Fortunately there is no signs of active infection at this time which is great news. No fevers, chills, nausea, vomiting, or diarrhea. 03/04/2020 on evaluation today patient actually appears to be doing much better in regard to his legs. There is really not much draining a lot of the dry skin is starting to loosen up amount of time to get as much of this off today as possible 03/11/2020 upon evaluation today patient appears to be doing well currently in regard to his legs. He still has a lot of dry skin some which I was able to get off today but overall I think that the biggest thing he needs right now is to be able to wash his legs in the shower to loosen some of this up which I  think will then come off much more effectively and quickly without as much of an issue. Fortunately there is no evidence of active infection at this time. No fevers, chills, nausea, vomiting, or diarrhea. Readmission: 06/05/2020 upon evaluation today patient appears to be doing worse in regard to his bilateral lower extremities. Unfortunately he has had some issues here with his leg started to swell again he tells me he was not able to wear the compression stockings that he got from elastic therapy. He states when he called the leg he said that the measurements did not seem right and subsequently just sent him something she thought would work. Either way he tells me they were very tight and very uncomfortable he just was not able to stick with it. Fortunately there is no signs of active infection at this time. No fevers, chills, nausea, vomiting, or diarrhea. 06/12/2020 on evaluation today patient actually appears to be doing well in regard to his legs. He has a lot of dry skin but nothing that appears to be open at this point. He did not get the Velcro compression wraps but does have the pull up compression stockings which if he wears those can definitely be sufficient here. Readmission: 08-25-2021 upon evaluation today patient appears to be doing well with regard to his legs all things considered. With that being said I do not see anything that actively looks like it is open or draining but I definitely see spots where he had blistering and obvious signs of drainage. Subsequently he is supposed to be having open heart surgery to replace  a valve that sounds like but they are not able to do that simply due to the fact that right now he is having more issues currently with his legs and they will not do anything until that is resolved. Fortunately I do not see any evidence of active infection locally or systemically which is great news. No fevers, chills, nausea, vomiting, or diarrhea. The patient does  have a history noted previously of lymphedema, chronic venous hypertension, chronic kidney disease stage III, congestive heart failure, and hypertension. 09-01-2021 upon evaluation today patient actually appears to be healed I do not see anything open at this time which is great news. Fortunately he is doing excellent and I think they were on the right track. With that being said there were a couple areas where he was so dry that the wrap actually stuck to his leg and this has caused some issues here with a couple small openings remaining but I think by next week these will be sealed up. Overall I think that he needs some lotion twice a day and Ace wraps are probably to be the best way to go to help with compression for now. He does have the vascular appointment upcoming in a couple of days. 6/7; patient comes in today with quite a deterioration from last week. Coupled with this is that he had his arterial studies which were really not very good on the right his ABI was 0.92 and on the left 1.09 but TBI was 0.52 on the right and 0.36 on the left he had monophasic waveforms almost diffusely on the right with an estimated 50 to 74% stenosis of the distal SFA on the left he had similar findings. 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 We are working on getting him a vascular evaluation./Consultation Also looking through his chart he has a lot of other medical problems including severe aortic stenosis and he has been referred to cardiac surgery. He has atrial fibrillation on Eliquis Electronic Signature(s) Signed: 09/08/2021 5:09:19 PM By: Baltazar Najjar MD Entered By: Baltazar Najjar on 09/08/2021 14:36:54 -------------------------------------------------------------------------------- Physical Exam Details Patient Name: Date of Service: [name], [name] 09/08/2021 1:45 PM Medical Record Number: [id number] Patient Account Number: [id number] Date of Birth/Sex: Treating RN: 12/27/1960 (61 y.o. [name], [name] Primary Care Provider: [name] Other Clinician: Referring Provider: Treating Provider/Extender: Michelle Piper in Treatment: 2 Constitutional Sitting or standing Blood Pressure is within target range for patient.. Pulse regular and within target range for patient.Marland Kitchen Respirations regular, non-labored and within target range.. Temperature is normal and within the target range for the patient.Marland Kitchen Appears in no distress. Cardiovascular 3-6 systolic ejection murmur. JVP is not elevated no obvious signs of congestive heart failure.. Popliteal pulses are not palpable. Pedal pulses absent bilaterally.Marland Kitchen Nonpitting edema. What looks to be severe chronic venous insufficiency. Notes Wound exam; the patient has several superficial denuded areas mostly on the left anterior tibial area which are leaking fluid also smaller areas on the right. None of these has any depth. Weeping edema fluid Electronic Signature(s) Signed: 09/08/2021 5:09:19 PM By: Baltazar Najjar MD Entered By: Baltazar Najjar on 09/08/2021 14:41:28 -------------------------------------------------------------------------------- Physician Orders Details Patient Name: Date of Service: [name], [name] 09/08/2021 1:45 PM Medical Record Number: [id number] Patient Account Number: [id number] Date of Birth/Sex: Treating RN: September 08, 1960 (61 y.o. [name] Primary Care  Provider: [name] Other Clinician: Referring Provider: Treating Provider/Extender: Maxwell Marion in Treatment: 2 Verbal / Phone Orders: No Diagnosis Coding ICD-10 Coding Code Description I89.0 Lymphedema, not elsewhere classified I87.333 Chronic venous hypertension (idiopathic) with ulcer and inflammation of bilateral lower extremity L97.822  Non-pressure chronic ulcer of other part of left lower leg with fat layer exposed L97.812 Non-pressure chronic ulcer of other part of right lower leg with fat layer exposed N18.30 Chronic kidney disease, stage 3 unspecified I50.42 Chronic combined systolic (congestive) and diastolic (congestive) heart failure I10 Essential (primary) hypertension 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 - 09/15/2021 @ 1000 w/ Toney Rakes Room # 9 Return A Nurse Visit: - 09/13/2021 Monday @ 1000 Room # 9 Bathing/ Shower/ Hygiene May shower with protection but do not get wound dressing(s) wet. Edema Control - Lymphedema / SCD / Other Elevate legs to the level of the heart or above for 30 minutes daily and/or when sitting, a frequency of: Avoid standing for long periods of time. Wound Treatment Wound #5 - Lower Leg Wound Laterality: Right, Circumferential Cleanser: Soap and Water Discharge Instructions: May shower and wash wound with dial antibacterial soap and water prior to dressing change. Peri-Wound Care: Triamcinolone 15 (g) Discharge Instructions: Use triamcinolone 15 (g) as directed Peri-Wound Care: Zinc Oxide Ointment 30g tube Discharge Instructions: Apply Zinc Oxide to periwound with each dressing change Peri-Wound Care: Sween Lotion (Moisturizing lotion) Discharge Instructions: Apply moisturizing lotion as directed Prim Dressing: KerraCel Ag Gelling Fiber Dressing, 4x5 in (silver alginate) ary Discharge Instructions: Apply silver alginate to wound bed as instructed Secondary Dressing: ABD Pad, 5x9 Discharge Instructions: Apply over primary dressing as directed. Compression Wrap: Kerlix Roll 4.5x3.1 (in/yd) Discharge Instructions: Apply Kerlix and Coban compression as directed. Compression Wrap: Coban  Self-Adherent Wrap 4x5 (in/yd) Discharge Instructions: Apply over Kerlix as directed. Wound #6 - Lower Leg Wound Laterality: Left, Circumferential Cleanser: Soap and Water Discharge Instructions: May shower and wash wound with dial antibacterial soap and water prior to dressing change. Peri-Wound Care: Triamcinolone 15 (g) Discharge Instructions: Use triamcinolone 15 (g) as directed Peri-Wound Care: Zinc Oxide Ointment 30g tube Discharge Instructions: Apply Zinc Oxide to periwound with each dressing change Peri-Wound Care: Sween Lotion (Moisturizing lotion) Discharge Instructions: Apply moisturizing lotion as directed Prim Dressing: KerraCel Ag Gelling Fiber Dressing, 4x5 in (silver alginate) ary Discharge Instructions: Apply silver alginate to wound bed as instructed Secondary Dressing: ABD Pad, 5x9 Discharge Instructions: Apply over primary dressing as directed. Compression Wrap: Kerlix Roll 4.5x3.1 (in/yd) Discharge Instructions: Apply Kerlix and Coban compression as directed. Compression Wrap: Coban Self-Adherent Wrap 4x5 (in/yd) Discharge Instructions: Apply over Kerlix as directed. Consults Vascular - STAT CONSULT D/T ABNORMAL TBI'S AND ARTERIAL DUPLEX TESTS. VASCULAR CONSULT WAS ORDERED 08/25/2021 AND PT. WAS NEVER CALLED TO SCHEDULE APPT ****THIS IS URGENT!!!**** . Electronic Signature(s) Signed: 09/08/2021 5:09:19 PM By: Linton Ham MD Signed: 09/09/2021 4:55:17 PM By: Rhae Hammock RN Entered By: Rhae Hammock on 09/08/2021 14:39:59 Prescription 09/08/2021 -------------------------------------------------------------------------------- Shawn Baas MD Patient Name: Provider: March 25, 1961 [id number] Date of Birth: NPI#: [id number] [id number] Sex: DEA #: [id number] Phone #: License #: [place] Patient Address: [place] [address] [place], [place] [address] [place], [place]  [phone] Allergies latex Provider's Orders Vascular - STAT CONSULT D/T  ABNORMAL TBI'S AND ARTERIAL DUPLEX TESTS. VASCULAR CONSULT WAS ORDERED 08/25/2021 AND PT. WAS NEVER CALLED TO SCHEDULE APPT ****THIS IS URGENT!!!**** . Hand Signature: Date(s): Electronic Signature(s) Signed: 09/08/2021 5:09:19 PM By: Baltazar Najjar MD Signed: 09/09/2021 4:55:17 PM By: Fonnie Mu RN Entered By: Fonnie Mu on 09/08/2021 14:39:59 -------------------------------------------------------------------------------- Problem List Details Patient Name: Date of Service: [name], [name] 09/08/2021 1:45 PM Medical Record Number: [id number] Patient Account Number: [id number] Date of Birth/Sex: Treating RN: [birth year] (61 y.o. [name], [name] Primary Care Provider: [name] Other Clinician: Referring Provider: Treating Provider/Extender: Michelle Piper in Treatment: 2 Active Problems ICD-10 Encounter Code Description Active Date MDM Diagnosis I89.0 Lymphedema, not elsewhere classified 08/25/2021 No Yes I87.333 Chronic venous hypertension (idiopathic) with ulcer and inflammation of 08/25/2021 No Yes bilateral lower extremity L97.822 Non-pressure chronic ulcer of other part of left lower leg with fat layer exposed5/24/2023 No Yes L97.812 Non-pressure chronic ulcer of other part of right lower leg with fat layer 08/25/2021 No Yes exposed N18.30 Chronic kidney disease, stage 3 unspecified 08/25/2021 No Yes I50.42 Chronic combined systolic (congestive) and diastolic (congestive) heart failure 08/25/2021 No Yes I10 Essential (primary) hypertension 08/25/2021 No Yes 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, right leg 09/08/2021 No Yes I70.248 Atherosclerosis of native arteries of left leg with ulceration of other part of 09/08/2021 No Yes lower leg Inactive Problems Resolved Problems Electronic Signature(s) Signed: 09/08/2021 5:09:19 PM By:  Baltazar Najjar MD Entered By: Baltazar Najjar on 09/08/2021 14:34:29 -------------------------------------------------------------------------------- Progress Note Details Patient Name: Date of Service: [name], [name] 09/08/2021 1:45 PM Medical Record Number: [id number] Patient Account Number: [id number] Date of Birth/Sex: Treating RN: 1960-11-15 (61 y.o. [name], [name] Primary Care Provider: [name] Other Clinician: Referring Provider: Treating Provider/Extender: Michelle Piper in Treatment: 2 Subjective History of Present Illness (HPI) 02/24/2020 on evaluation today patient presents with obvious chronic lymphedema of the bilateral lower extremities. Fortunately there is no signs of active infection at this time which is great news. With that being said the patient does have a recent treatment with doxycycline 100 mg for 10 days which he has completed. He does have a history of chronic kidney disease stage III as documented in his chart. He also has chronic venous insufficiency with lower extremity lymphedema noted. He has congestive heart failure and hypertension. He tells me currently that he was unaware of the kidney disease before he mentioned this today. He also tells me that his primary care provider had told him to quit taking showers based on what was going on with his legs at this time. Fortunately there is no signs of active infection at this time which is great news. No fevers, chills, nausea, vomiting, or diarrhea. 03/04/2020 on evaluation today patient actually appears to be doing much better in regard to his legs. There is really not much draining a lot of the dry skin is starting to loosen up amount of time to get as much of this off today as possible 03/11/2020 upon evaluation today patient appears to be doing well currently in regard to his legs. He still has a lot of dry skin some which I was able to get off today but overall I think that the  biggest thing he needs right now is to be able to wash his legs in the shower to loosen some of this up which I think will then come off much more effectively and quickly without as much of an issue. Fortunately there is  no evidence of active infection at this time. No fevers, chills, nausea, vomiting, or diarrhea. Readmission: 06/05/2020 upon evaluation today patient appears to be doing worse in regard to his bilateral lower extremities. Unfortunately he has had some issues here with his leg started to swell again he tells me he was not able to wear the compression stockings that he got from elastic therapy. He states when he called the leg he said that the measurements did not seem right and subsequently just sent him something she thought would work. Either way he tells me they were very tight and very uncomfortable he just was not able to stick with it. Fortunately there is no signs of active infection at this time. No fevers, chills, nausea, vomiting, or diarrhea. 06/12/2020 on evaluation today patient actually appears to be doing well in regard to his legs. He has a lot of dry skin but nothing that appears to be open at this point. He did not get the Velcro compression wraps but does have the pull up compression stockings which if he wears those can definitely be sufficient here. Readmission: 08-25-2021 upon evaluation today patient appears to be doing well with regard to his legs all things considered. With that being said I do not see anything that actively looks like it is open or draining but I definitely see spots where he had blistering and obvious signs of drainage. Subsequently he is supposed to be having open heart surgery to replace a valve that sounds like but they are not able to do that simply due to the fact that right now he is having more issues currently with his legs and they will not do anything until that is resolved. Fortunately I do not see any evidence of active infection  locally or systemically which is great news. No fevers, chills, nausea, vomiting, or diarrhea. The patient does have a history noted previously of lymphedema, chronic venous hypertension, chronic kidney disease stage III, congestive heart failure, and hypertension. 09-01-2021 upon evaluation today patient actually appears to be healed I do not see anything open at this time which is great news. Fortunately he is doing excellent and I think they were on the right track. With that being said there were a couple areas where he was so dry that the wrap actually stuck to his leg and this has caused some issues here with a couple small openings remaining but I think by next week these will be sealed up. Overall I think that he needs some lotion twice a day and Ace wraps are probably to be the best way to go to help with compression for now. He does have the vascular appointment upcoming in a couple of days. 6/7; patient comes in today with quite a deterioration from last week. Coupled with this is that he had his arterial studies which were really not very good on the right his ABI was 0.92 and on the left 1.09 but TBI was 0.52 on the right and 0.36 on the left he had monophasic waveforms almost diffusely on the right with an estimated 50 to 74% stenosis of the distal SFA on the left he had similar findings. 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 We are working on getting him a vascular evaluation./Consultation Also looking through his chart he has a lot of other medical problems including severe aortic  stenosis and he has been referred to cardiac surgery. He has atrial fibrillation on Eliquis Objective Constitutional Sitting or standing Blood Pressure is within target range for patient.. Pulse regular and within target range for patient.Marland Kitchen  Respirations regular, non-labored and within target range.. Temperature is normal and within the target range for the patient.Marland Kitchen Appears in no distress. Vitals Time Taken: 2:06 PM, Height: 69 in, Weight: 215 lbs, BMI: 31.7, Temperature: 98.7 F, Pulse: 74 bpm, Respiratory Rate: 17 breaths/min, Blood Pressure: 134/74 mmHg. Cardiovascular 3-6 systolic ejection murmur. JVP is not elevated no obvious signs of congestive heart failure.. Popliteal pulses are not palpable. Pedal pulses absent bilaterally.Marland Kitchen Nonpitting edema. What looks to be severe chronic venous insufficiency. General Notes: Wound exam; the patient has several superficial denuded areas mostly on the left anterior tibial area which are leaking fluid also smaller areas on the right. None of these has any depth. Weeping edema fluid Integumentary (Hair, Skin) Wound #5 status is Open. Original cause of wound was Gradually Appeared. The date acquired was: 07/03/2021. The wound has been in treatment 2 weeks. The wound is located on the Right,Circumferential Lower Leg. The wound measures 31cm length x 38cm width x 0.1cm depth; 925.199cm^2 area and 92.52cm^3 volume. There is a medium amount of serosanguineous drainage noted. The wound margin is distinct with the outline attached to the wound base. There is large (67-100%) red, pink granulation within the wound bed. There is a small (1-33%) amount of necrotic tissue within the wound bed including Adherent Slough. Wound #6 status is Open. Original cause of wound was Gradually Appeared. The date acquired was: 07/03/2021. The wound has been in treatment 2 weeks. The wound is located on the Left,Circumferential Lower Leg. The wound measures 31cm length x 38cm width x 0.1cm depth; 925.199cm^2 area and 92.52cm^3 volume. There is a medium amount of serosanguineous drainage noted. The wound margin is distinct with the outline attached to the wound base. There is large (67-100%) red, pink granulation within the  wound bed. There is a small (1-33%) amount of necrotic tissue within the wound bed including Adherent Slough. 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 - 09/15/2021 @ 1000 w/ Toney Rakes Room # 9 Nurse Visit: - 09/13/2021 Monday @ 1000 Room # 9 Bathing/ Shower/ Hygiene: May shower with protection but do not get wound dressing(s) wet. Edema Control - Lymphedema / SCD / Other: Elevate legs to the level of the heart or above for 30 minutes daily and/or when sitting, a frequency of: Avoid standing for long periods of time. Consults ordered were: Vascular - STAT CONSULT D/T ABNORMAL TBI'S AND ARTERIAL DUPLEX TESTS. VASCULAR CONSULT WAS ORDERED 08/25/2021 AND PT WAS . NEVER CALLED TO SCHEDULE APPT ****THIS IS URGENT!!!**** . WOUND #5: - Lower Leg Wound Laterality: Right, Circumferential Cleanser: Soap and Water Discharge Instructions: May shower and wash wound with dial antibacterial soap and water prior to dressing change. Peri-Wound Care: Triamcinolone 15 (g) Discharge Instructions: Use triamcinolone 15 (  g) as directed Peri-Wound Care: Zinc Oxide Ointment 30g tube Discharge Instructions: Apply Zinc Oxide to periwound with each dressing change Peri-Wound Care: Sween Lotion (Moisturizing lotion) Discharge Instructions: Apply  moisturizing lotion as directed Prim Dressing: KerraCel Ag Gelling Fiber Dressing, 4x5 in (silver alginate) ary Discharge Instructions: Apply silver alginate to wound bed as instructed Secondary Dressing: ABD Pad, 5x9 Discharge Instructions: Apply over primary dressing as directed. Com pression Wrap: Kerlix Roll 4.5x3.1 (in/yd) Discharge Instructions: Apply Kerlix and Coban compression as directed. Com pression Wrap: Coban Self-Adherent Wrap 4x5 (in/yd) Discharge Instructions: Apply over Kerlix as directed. WOUND #6: - Lower Leg Wound Laterality: Left, Circumferential Cleanser: Soap and Water Discharge Instructions: May shower and wash wound with dial antibacterial soap and water prior to dressing change. Peri-Wound Care: Triamcinolone 15 (g) Discharge Instructions: Use triamcinolone 15 (g) as directed Peri-Wound Care: Zinc Oxide Ointment 30g tube Discharge Instructions: Apply Zinc Oxide to periwound with each dressing change Peri-Wound Care: Sween Lotion (Moisturizing lotion) Discharge Instructions: Apply moisturizing lotion as directed Prim Dressing: KerraCel Ag Gelling Fiber Dressing, 4x5 in (silver alginate) ary Discharge Instructions: Apply silver alginate to wound bed as instructed Secondary Dressing: ABD Pad, 5x9 Discharge Instructions: Apply over primary dressing as directed. Com pression Wrap: Kerlix Roll 4.5x3.1 (in/yd) Discharge Instructions: Apply Kerlix and Coban compression as directed. Com pression Wrap: Coban Self-Adherent Wrap 4x5 (in/yd) Discharge Instructions: Apply over Kerlix as directed. 1. We put silver alginate to the weeping areas ABDs and put him in a kerlix Coban wrap. I think he should be able to tolerate this. 2. Significant PAD. He is a nondiabetic we have tried to refer him to vein and vascular for review. Ideally he would be a candidate for an angiogram however there is a lot of other medical issues here including severe aortic stenosis, on Eliquis for  atrial fibrillation etc.Although the history is less than ideal I think he probably has some degree of claudication 3. Hopefully we can get the areas under control which are breaking down currently especially on the L left anterior lower leg. After the test last week he had to do his own wraps which may have contributed to things. Electronic Signature(s) Signed: 09/08/2021 5:09:19 PM By: Linton Ham MD Entered By: Linton Ham on 09/08/2021 14:43:16 -------------------------------------------------------------------------------- SuperBill Details Patient Name: Date of Service: ATANACIO, DUFFORD 09/08/2021 Medical Record Number: [id number] Patient Account Number: [id number] Date of Birth/Sex: Treating RN: 05-15-1960 (61 y.o. 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 N18.30 Chronic kidney disease, stage 3 unspecified I50.42 Chronic combined systolic (congestive) and diastolic (congestive) heart failure I10 Essential (primary) hypertension 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, 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 Facility Procedures CPT4 Code: [id number] Description: 5 - WOUND CARE VISIT-LEV 5 EST PT Modifier: 1 Quantity: Physician Procedures : CPT4 Code Description Modifier [id number] - WC PHYS LEVEL 4 - EST PT ICD-10 Diagnosis Description I89.0 Lymphedema, not elsewhere  classified I87.333 Chronic venous hypertension (idiopathic) with 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

## 2021-09-09 NOTE — Progress Notes (Signed)
[name] ([id number]) Visit Report for 09/08/2021 Arrival Information Details Patient Name: Date of Service: [name], [name] 09/08/2021 1:45 PM Medical Record Number: [id number] Patient Account Number: [id number] Date of Birth/Sex: Treating RN: [birth year] (61 y.o. [name], [name] Primary Care Paulette Lynch: [name] Other Clinician: Referring Navina Wohlers: Treating Rhaya Coale/Extender: Maxwell Marion in Treatment: 2 Visit Information History Since Last Visit Added or deleted any medications: No Patient Arrived: [name] Any new allergies or adverse reactions: No Arrival Time: 13:50 Had Shawn fall or experienced change in No Accompanied By: self activities of daily living that may affect Transfer Assistance: Manual risk of falls: Patient Identification Verified: Yes Signs or symptoms of abuse/neglect since last visito No Secondary Verification Process Completed: Yes Hospitalized since last visit: No Patient Requires Transmission-Based Precautions: No Implantable device outside of the clinic excluding No Patient Has Alerts: No cellular tissue based products placed in the center since last visit: Has Dressing in Place as Prescribed: Yes Has Compression in Place as Prescribed: Yes Pain Present Now: Yes Electronic Signature(s) Signed: 09/09/2021 4:55:17 PM By: Rhae Hammock RN Entered By: Rhae Hammock on 09/08/2021 13:53:10 -------------------------------------------------------------------------------- Clinic Level of Care Assessment Details Patient Name: Date of Service: [name], [name] 09/08/2021 1:45 PM Medical Record Number: [id number] Patient Account Number: [id number] Date of Birth/Sex: Treating RN: 07/12/1960 (61 y.o. [name], [name] Primary Care Dannon Nguyenthi: [name] Other Clinician: Referring Jhonatan Lomeli: Treating Krystal Delduca/Extender: Maxwell Marion in Treatment: 2 Clinic Level of Care Assessment Items TOOL 4 Quantity  Score X- 1 0 Use when only an EandM is performed on FOLLOW-UP visit ASSESSMENTS - Nursing Assessment / Reassessment X- 1 10 Reassessment of Co-morbidities (includes updates in patient status) X- 1 5 Reassessment of Adherence to Treatment Plan ASSESSMENTS - Wound and Skin Shawn ssessment / Reassessment []  - 0 Simple Wound Assessment / Reassessment - one wound X- 2 5 Complex Wound Assessment / Reassessment - multiple wounds []  - 0 Dermatologic / Skin Assessment (not related to wound area) ASSESSMENTS - Focused Assessment X- 2 5 Circumferential Edema Measurements - multi extremities []  - 0 Nutritional Assessment / Counseling / Intervention []  - 0 Lower Extremity Assessment (monofilament, tuning fork, pulses) []  - 0 Peripheral Arterial Disease Assessment (using hand held doppler) ASSESSMENTS - Ostomy and/or Continence Assessment and Care []  - 0 Incontinence Assessment and Management []  - 0 Ostomy Care Assessment and Management (repouching, etc.) PROCESS - Coordination of Care []  - 0 Simple Patient / Family Education for ongoing care X- 1 20 Complex (extensive) Patient / Family Education for ongoing care X- 1 10 Staff obtains Programmer, systems, Records, T Results / Process Orders est []  - 0 Staff telephones HHA, Nursing Homes / Clarify orders / etc []  - 0 Routine Transfer to another Facility (non-emergent condition) []  - 0 Routine Hospital Admission (non-emergent condition) []  - 0 New Admissions / Biomedical engineer / Ordering NPWT Apligraf, etc. , []  - 0 Emergency Hospital Admission (emergent condition) []  - 0 Simple Discharge Coordination X- 1 15 Complex (extensive) Discharge Coordination PROCESS - Special Needs []  - 0 Pediatric / Minor Patient Management []  - 0 Isolation Patient Management []  - 0 Hearing / Language / Visual special needs []  - 0 Assessment of Community assistance (transportation, D/C planning, etc.) []  - 0 Additional assistance / Altered  mentation []  - 0 Support Surface(s) Assessment (bed, cushion, seat, etc.) INTERVENTIONS - Wound Cleansing / Measurement []  - 0 Simple Wound Cleansing - one wound X- 2 5 Complex Wound Cleansing - multiple wounds  X- 1 5 Wound Imaging (photographs - any number of wounds) []  - 0 Wound Tracing (instead of photographs) []  - 0 Simple Wound Measurement - one wound X- 2 5 Complex Wound Measurement - multiple wounds INTERVENTIONS - Wound Dressings []  - 0 Small Wound Dressing one or multiple wounds X- 2 15 Medium Wound Dressing one or multiple wounds []  - 0 Large Wound Dressing one or multiple wounds X- 1 5 Application of Medications - topical []  - 0 Application of Medications - injection INTERVENTIONS - Miscellaneous []  - 0 External ear exam []  - 0 Specimen Collection (cultures, biopsies, blood, body fluids, etc.) []  - 0 Specimen(s) / Culture(s) sent or taken to Lab for analysis []  - 0 Patient Transfer (multiple staff / Civil Service fast streamer / Similar devices) []  - 0 Simple Staple / Suture removal (25 or less) []  - 0 Complex Staple / Suture removal (26 or more) []  - 0 Hypo / Hyperglycemic Management (close monitor of Blood Glucose) []  - 0 Ankle / Brachial Index (ABI) - do not check if billed separately X- 1 5 Vital Signs Has the patient been seen at the hospital within the last three years: Yes Total Score: 145 Level Of Care: New/Established - Level 4 Electronic Signature(s) Signed: 09/09/2021 4:55:17 PM By: Rhae Hammock RN Entered By: Rhae Hammock on 09/08/2021 14:59:50 -------------------------------------------------------------------------------- Encounter Discharge Information Details Patient Name: Date of Service: [name], [name] 09/08/2021 1:45 PM Medical Record Number: [id number] Patient Account Number: [id number] Date of Birth/Sex: Treating RN: 10/01/1960 (61 y.o. [name], [name] Primary Care Aaliyah Gavel: [name] Other Clinician: Referring  Naraya Stoneberg: Treating Emon Lance/Extender: Maxwell Marion in Treatment: 2 Encounter Discharge Information Items Discharge Condition: Stable Ambulatory Status: Ambulatory Discharge Destination: Home Transportation: Private Auto Accompanied By: self Schedule Follow-up Appointment: Yes Clinical Summary of Care: Patient Declined Electronic Signature(s) Signed: 09/09/2021 4:55:17 PM By: Rhae Hammock RN Entered By: Rhae Hammock on 09/08/2021 15:02:55 -------------------------------------------------------------------------------- Lower Extremity Assessment Details Patient Name: Date of Service: [name], [name] 09/08/2021 1:45 PM Medical Record Number: [id number] Patient Account Number: [id number] Date of Birth/Sex: Treating RN: 1961/01/18 (61 y.o. [name], [name] Primary Care Murry Diaz: [name] Other Clinician: Referring Shaniqua Guillot: Treating Andreka Stucki/Extender: Maxwell Marion in Treatment: 2 Edema Assessment Assessed: Shirlyn Goltz: Yes] Patrice Paradise: Yes] Edema: [Left: Yes] [Right: Yes] Calf Left: Right: Point of Measurement: From Medial Instep 38 cm 37 cm Ankle Left: Right: Point of Measurement: From Medial Instep 27 cm 27 cm Vascular Assessment Pulses: Dorsalis Pedis Palpable: [Left:Yes] [Right:Yes] Posterior Tibial Palpable: [Left:Yes] [Right:Yes] Electronic Signature(s) Signed: 09/09/2021 4:55:17 PM By: Rhae Hammock RN Entered By: Rhae Hammock on 09/08/2021 13:54:20 -------------------------------------------------------------------------------- Multi Wound Chart Details Patient Name: Date of Service: [name], [name] 09/08/2021 1:45 PM Medical Record Number: [id number] Patient Account Number: [id number] Date of Birth/Sex: Treating RN: 07/28/1960 (61 y.o. [name], [name] Primary Care Ferlin Fairhurst: [name] Other Clinician: Referring Teryl Gubler: Treating Zyler Hyson/Extender: Maxwell Marion in  Treatment: 2 Vital Signs Height(in): 84 Pulse(bpm): 74 Weight(lbs): 215 Blood Pressure(mmHg): 134/74 Body Mass Index(BMI): 31.7 Temperature(F): 98.7 Respiratory Rate(breaths/min): 17 Photos: [N/Shawn:N/Shawn] Right, Circumferential Lower Leg Left, Circumferential Lower Leg N/Shawn Wound Location: Gradually Appeared Gradually Appeared N/Shawn Wounding Event: Venous Leg Ulcer Venous Leg Ulcer N/Shawn Primary Etiology: Congestive Heart Failure, Congestive Heart Failure, N/Shawn Comorbid History: Hypertension Hypertension 07/03/2021 07/03/2021 N/Shawn Date Acquired: 2 2 N/Shawn Weeks of Treatment: Open Open N/Shawn Wound Status: No No N/Shawn Wound Recurrence: 31x38x0.1 31x38x0.1 N/Shawn Measurements L x W x D (cm) 925.199 925.199 N/Shawn  Shawn (cm) : rea 92.52 92.52 N/Shawn Volume (cm) : 0.00% -18.70% N/Shawn % Reduction in Area: 0.00% -18.80% N/Shawn % Reduction in Volume: Full Thickness With Exposed Support Full Thickness With Exposed Support N/Shawn Classification: Structures Structures Medium Medium N/Shawn Exudate Amount: Serosanguineous Serosanguineous N/Shawn Exudate Type: red, brown red, brown N/Shawn Exudate Color: Distinct, outline attached Distinct, outline attached N/Shawn Wound Margin: Large (67-100%) Large (67-100%) N/Shawn Granulation Amount: Red, Pink Red, Pink N/Shawn Granulation Quality: Small (1-33%) Small (1-33%) N/Shawn Necrotic Amount: Fascia: No Fascia: No N/Shawn Exposed Structures: Fat Layer (Subcutaneous Tissue): No Fat Layer (Subcutaneous Tissue): No Tendon: No Tendon: No Muscle: No Muscle: No Joint: No Joint: No Bone: No Bone: No None None N/Shawn Epithelialization: Treatment Notes Electronic Signature(s) Signed: 09/08/2021 5:09:19 PM By: Linton Ham MD Signed: 09/09/2021 4:55:17 PM By: Rhae Hammock RN Entered By: Linton Ham on 09/08/2021 14:34:37 -------------------------------------------------------------------------------- Multi-Disciplinary Care Plan Details Patient Name: Date of Service: [name], [name]  09/08/2021 1:45 PM Medical Record Number: [id number] Patient Account Number: [id number] Date of Birth/Sex: Treating RN: [birth year] (61 y.o. [name], [name] Primary Care Emmette Katt: [name] Other Clinician: Referring Mallery Harshman: Treating Ayleah Hofmeister/Extender: Maxwell Marion in Treatment: 2 Active Inactive Electronic Signature(s) Signed: 09/09/2021 4:55:17 PM By: Rhae Hammock RN Entered By: Rhae Hammock on 09/08/2021 14:56:02 -------------------------------------------------------------------------------- Pain Assessment Details Patient Name: Date of Service: [name], [name] 09/08/2021 1:45 PM Medical Record Number: [id number] Patient Account Number: [id number] Date of Birth/Sex: Treating RN: 22-Dec-1960 (61 y.o. [name], [name] Primary Care Deanza Upperman: [name] Other Clinician: Referring Tracey Hermance: Treating Ellen Mayol/Extender: Maxwell Marion in Treatment: 2 Active Problems Location of Pain Severity and Description of Pain Patient Has Paino Yes Site Locations Pain Location: Generalized Pain, Pain in Ulcers With Dressing Change: Yes Duration of the Pain. Constant / Intermittento Constant Rate the pain. Current Pain Level: 8 Worst Pain Level: 10 Least Pain Level: 0 Tolerable Pain Level: 8 Character of Pain Describe the Pain: Aching Pain Management and Medication Current Pain Management: Medication: No Cold Application: No Rest: No Massage: No Activity: No T.E.N.S.: No Heat Application: No Leg drop or elevation: No Is the Current Pain Management Adequate: Adequate How does your wound impact your activities of daily livingo Sleep: No Bathing: No Appetite: No Relationship With Others: No Bladder Continence: No Emotions: No Bowel Continence: No Work: No Toileting: No Drive: No Dressing: No Hobbies: No Electronic Signature(s) Signed: 09/09/2021 4:55:17 PM By: Rhae Hammock RN Entered By:  Rhae Hammock on 09/08/2021 13:53:59 -------------------------------------------------------------------------------- Patient/Caregiver Education Details Patient Name: Date of Service: Rolla Small 6/7/2023andnbsp1:45 PM Medical Record Number: [id number] Patient Account Number: [id number] Date of Birth/Gender: Treating RN: 1961-03-26 (61 y.o. Erie Noe Primary Care Physician: [name] Other Clinician: Referring Physician: Treating Physician/Extender: Maxwell Marion in Treatment: 2 Education Assessment Education Provided To: Patient Education Topics Provided Wound/Skin Impairment: Methods: Explain/Verbal Responses: Reinforcements needed, State content correctly Electronic Signature(s) Signed: 09/09/2021 4:55:17 PM By: Rhae Hammock RN Entered By: Rhae Hammock on 09/08/2021 14:56:34 -------------------------------------------------------------------------------- Wound Assessment Details Patient Name: Date of Service: [name], [name] 09/08/2021 1:45 PM Medical Record Number: [id number] Patient Account Number: [id number] Date of Birth/Sex: Treating RN: Aug 28, 1960 (61 y.o. [name], [name] Primary Care Jakyron Fabro: [name] Other Clinician: Referring Kenith Trickel: Treating Tarren Velardi/Extender: Maxwell Marion in Treatment: 2 Wound Status Wound Number: 5 Primary Etiology: Venous Leg Ulcer Wound Location: Right, Circumferential Lower Leg Wound Status: Open Wounding Event: Gradually Appeared Comorbid History: Congestive Heart Failure, Hypertension Date Acquired:  07/03/2021 Weeks Of Treatment: 2 Clustered Wound: No Photos Wound Measurements Length: (cm) 31 Width: (cm) 38 Depth: (cm) 0.1 Area: (cm) 925.199 Volume: (cm) 92.52 % Reduction in Area: 0% % Reduction in Volume: 0% Epithelialization: None Wound Description Classification: Full Thickness With Exposed Support Structures Wound Margin:  Distinct, outline attached Exudate Amount: Medium Exudate Type: Serosanguineous Exudate Color: red, brown Foul Odor After Cleansing: No Slough/Fibrino Yes Wound Bed Granulation Amount: Large (67-100%) Exposed Structure Granulation Quality: Red, Pink Fascia Exposed: No Necrotic Amount: Small (1-33%) Fat Layer (Subcutaneous Tissue) Exposed: No Necrotic Quality: Adherent Slough Tendon Exposed: No Muscle Exposed: No Joint Exposed: No Bone Exposed: No Treatment Notes Wound #5 (Lower Leg) Wound Laterality: Right, Circumferential Cleanser Soap and Water Discharge Instruction: May shower and wash wound with dial antibacterial soap and water prior to dressing change. Peri-Wound Care Triamcinolone 15 (g) Discharge Instruction: Use triamcinolone 15 (g) as directed Zinc Oxide Ointment 30g tube Discharge Instruction: Apply Zinc Oxide to periwound with each dressing change Sween Lotion (Moisturizing lotion) Discharge Instruction: Apply moisturizing lotion as directed Topical Primary Dressing KerraCel Ag Gelling Fiber Dressing, 4x5 in (silver alginate) Discharge Instruction: Apply silver alginate to wound bed as instructed Secondary Dressing ABD Pad, 5x9 Discharge Instruction: Apply over primary dressing as directed. Secured With Compression Wrap Kerlix Shawn 4.5x3.1 (in/yd) Discharge Instruction: Apply Kerlix and Coban compression as directed. Coban Self-Adherent Wrap 4x5 (in/yd) Discharge Instruction: Apply over Kerlix as directed. Compression Stockings Add-Ons Electronic Signature(s) Signed: 09/09/2021 4:55:17 PM By: Rhae Hammock RN Entered By: Rhae Hammock on 09/08/2021 14:09:37 -------------------------------------------------------------------------------- Wound Assessment Details Patient Name: Date of Service: [name], [name] 09/08/2021 1:45 PM Medical Record Number: [id number] Patient Account Number: [id number] Date of Birth/Sex: Treating RN: 1960-04-10 (61 y.o. [name], [name] Primary Care Jase Reep: [name] Other Clinician: Referring Brick Ketcher: Treating Pollyann Roa/Extender: Maxwell Marion in Treatment: 2 Wound Status Wound Number: 6 Primary Etiology: Venous Leg Ulcer Wound Location: Left, Circumferential Lower Leg Wound Status: Open Wounding Event: Gradually Appeared Comorbid History: Congestive Heart Failure, Hypertension Date Acquired: 07/03/2021 Weeks Of Treatment: 2 Clustered Wound: No Photos Wound Measurements Length: (cm) 31 Width: (cm) 38 Depth: (cm) 0.1 Area: (cm) 925.199 Volume: (cm) 92.52 % Reduction in Area: -18.7% % Reduction in Volume: -18.8% Epithelialization: None Wound Description Classification: Full Thickness With Exposed Support Structures Wound Margin: Distinct, outline attached Exudate Amount: Medium Exudate Type: Serosanguineous Exudate Color: red, brown Foul Odor After Cleansing: No Slough/Fibrino Yes Wound Bed Granulation Amount: Large (67-100%) Exposed Structure Granulation Quality: Red, Pink Fascia Exposed: No Necrotic Amount: Small (1-33%) Fat Layer (Subcutaneous Tissue) Exposed: No Necrotic Quality: Adherent Slough Tendon Exposed: No Muscle Exposed: No Joint Exposed: No Bone Exposed: No Treatment Notes Wound #6 (Lower Leg) Wound Laterality: Left, Circumferential Cleanser Soap and Water Discharge Instruction: May shower and wash wound with dial antibacterial soap and water prior to dressing change. Peri-Wound Care Triamcinolone 15 (g) Discharge Instruction: Use triamcinolone 15 (g) as directed Zinc Oxide Ointment 30g tube Discharge Instruction: Apply Zinc Oxide to periwound with each dressing change Sween Lotion (Moisturizing lotion) Discharge Instruction: Apply moisturizing lotion as directed Topical Primary Dressing KerraCel Ag Gelling Fiber Dressing, 4x5 in (silver alginate) Discharge Instruction: Apply silver alginate to wound bed as  instructed Secondary Dressing ABD Pad, 5x9 Discharge Instruction: Apply over primary dressing as directed. Secured With Compression Wrap Kerlix Shawn 4.5x3.1 (in/yd) Discharge Instruction: Apply Kerlix and Coban compression as directed. Coban Self-Adherent Wrap 4x5 (in/yd) Discharge Instruction: Apply over Kerlix as directed. Compression Stockings Add-Ons  Electronic Signature(s) Signed: 09/09/2021 4:55:17 PM By: Rhae Hammock RN Entered By: Rhae Hammock on 09/08/2021 14:09:53 -------------------------------------------------------------------------------- Vitals Details Patient Name: Date of Service: MOSTYN, BUETER 09/08/2021 1:45 PM Medical Record Number: [id number] Patient Account Number: [id number] Date of Birth/Sex: Treating RN: December 01, 1960 (61 y.o. [name], [name] Primary Care Mela Perham: [name] Other Clinician: Referring Cebert Dettmann: Treating Zeph Riebel/Extender: Maxwell Marion in Treatment: 2 Vital Signs Time Taken: 14:06 Temperature (F): 98.7 Height (in): 69 Pulse (bpm): 74 Weight (lbs): 215 Respiratory Rate (breaths/min): 17 Body Mass Index (BMI): 31.7 Blood Pressure (mmHg): 134/74 Reference Range: 80 - 120 mg / dl Electronic Signature(s) Signed: 09/09/2021 4:55:17 PM By: Rhae Hammock RN Entered By: Rhae Hammock on 09/08/2021 14:07:07

## 2021-09-09 NOTE — Telephone Encounter (Signed)
Please see the MyChart message reply(ies) for my assessment and plan.  ?  ?This patient gave consent for this Medical Advice Message and is aware that it may result in a bill to their insurance company, as well as the possibility of receiving a bill for a co-payment or deductible. They are an established patient, but are not seeking medical advice exclusively about a problem treated during an in person or video visit in the last seven days. I did not recommend an in person or video visit within seven days of my reply.  ?  ?I spent a total of 8 minutes cumulative time within 7 days through MyChart messaging. ? ?Doil Kamara, MD   ?

## 2021-09-10 ENCOUNTER — Telehealth: Payer: Self-pay | Admitting: Critical Care Medicine

## 2021-09-10 NOTE — Telephone Encounter (Signed)
Pt states "I'm in a lot of pain and I was told by a specialist that I can use 400 mg of gabapentin three times a day so I was sending you a message to see if you can prescribe that for pain because my nerves and everything is throwed off I really don't feel good my legs are really sore and from what I heard together will help me and it works I thank you so much Dr Saralyn Pilar I hope that you have a wonderful day and a wonderful weekend"  Please contact pt with an update. Thank you.

## 2021-09-10 NOTE — Telephone Encounter (Signed)
Call pt and tell him I sent this already to his [facility] on [address]

## 2021-09-13 ENCOUNTER — Encounter (HOSPITAL_BASED_OUTPATIENT_CLINIC_OR_DEPARTMENT_OTHER): Payer: Medicare Other | Admitting: Internal Medicine

## 2021-09-13 DIAGNOSIS — I890 Lymphedema, not elsewhere classified: Secondary | ICD-10-CM | POA: Diagnosis not present

## 2021-09-13 DIAGNOSIS — I5042 Chronic combined systolic (congestive) and diastolic (congestive) heart failure: Secondary | ICD-10-CM | POA: Diagnosis not present

## 2021-09-13 DIAGNOSIS — I87333 Chronic venous hypertension (idiopathic) with ulcer and inflammation of bilateral lower extremity: Secondary | ICD-10-CM | POA: Diagnosis not present

## 2021-09-13 DIAGNOSIS — L97812 Non-pressure chronic ulcer of other part of right lower leg with fat layer exposed: Secondary | ICD-10-CM | POA: Diagnosis not present

## 2021-09-13 DIAGNOSIS — L97822 Non-pressure chronic ulcer of other part of left lower leg with fat layer exposed: Secondary | ICD-10-CM | POA: Diagnosis not present

## 2021-09-13 DIAGNOSIS — I130 Hypertensive heart and chronic kidney disease with heart failure and stage 1 through stage 4 chronic kidney disease, or unspecified chronic kidney disease: Secondary | ICD-10-CM | POA: Diagnosis not present

## 2021-09-13 DIAGNOSIS — I70201 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, right leg: Secondary | ICD-10-CM | POA: Diagnosis not present

## 2021-09-13 DIAGNOSIS — I70248 Atherosclerosis of native arteries of left leg with ulceration of other part of lower left leg: Secondary | ICD-10-CM | POA: Diagnosis not present

## 2021-09-13 DIAGNOSIS — N183 Chronic kidney disease, stage 3 unspecified: Secondary | ICD-10-CM | POA: Diagnosis not present

## 2021-09-13 NOTE — Telephone Encounter (Signed)
Called pt and he is aware. 

## 2021-09-14 ENCOUNTER — Ambulatory Visit (INDEPENDENT_AMBULATORY_CARE_PROVIDER_SITE_OTHER): Payer: Medicare Other | Admitting: Podiatry

## 2021-09-14 ENCOUNTER — Encounter: Payer: Self-pay | Admitting: Podiatry

## 2021-09-14 DIAGNOSIS — D689 Coagulation defect, unspecified: Secondary | ICD-10-CM | POA: Diagnosis not present

## 2021-09-14 DIAGNOSIS — D6869 Other thrombophilia: Secondary | ICD-10-CM

## 2021-09-14 DIAGNOSIS — B351 Tinea unguium: Secondary | ICD-10-CM | POA: Diagnosis not present

## 2021-09-14 DIAGNOSIS — M79674 Pain in right toe(s): Secondary | ICD-10-CM | POA: Diagnosis not present

## 2021-09-14 DIAGNOSIS — M79675 Pain in left toe(s): Secondary | ICD-10-CM
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
## 2021-09-15 ENCOUNTER — Encounter (HOSPITAL_BASED_OUTPATIENT_CLINIC_OR_DEPARTMENT_OTHER): Payer: Medicare Other | Admitting: Physician Assistant

## 2021-09-15 DIAGNOSIS — I130 Hypertensive heart and chronic kidney disease with heart failure and stage 1 through stage 4 chronic kidney disease, or unspecified chronic kidney disease: Secondary | ICD-10-CM | POA: Diagnosis not present

## 2021-09-15 DIAGNOSIS — L97812 Non-pressure chronic ulcer of other part of right lower leg with fat layer exposed: Secondary | ICD-10-CM | POA: Diagnosis not present

## 2021-09-15 DIAGNOSIS — I890 Lymphedema, not elsewhere classified: Secondary | ICD-10-CM | POA: Diagnosis not present

## 2021-09-15 DIAGNOSIS — I70248 Atherosclerosis of native arteries of left leg with ulceration of other part of lower left leg: Secondary | ICD-10-CM | POA: Diagnosis not present

## 2021-09-15 DIAGNOSIS — I87333 Chronic venous hypertension (idiopathic) with ulcer and inflammation of bilateral lower extremity: Secondary | ICD-10-CM | POA: Diagnosis not present

## 2021-09-15 DIAGNOSIS — I5042 Chronic combined systolic (congestive) and diastolic (congestive) heart failure: Secondary | ICD-10-CM | POA: Diagnosis not present

## 2021-09-15 DIAGNOSIS — L97822 Non-pressure chronic ulcer of other part of left lower leg with fat layer exposed: Secondary | ICD-10-CM | POA: Diagnosis not present

## 2021-09-15 DIAGNOSIS — I70201 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, right leg: Secondary | ICD-10-CM | POA: Diagnosis not present

## 2021-09-15 DIAGNOSIS — N183 Chronic kidney disease, stage 3 unspecified: Secondary | ICD-10-CM | POA: Diagnosis not present

## 2021-09-15 NOTE — Progress Notes (Addendum)
GIORDAN, GILBERTSON ([id number]) Visit Report for 09/15/2021 Chief Complaint Document Details Patient Name: Date of Service: [name], [name] 09/15/2021 10:00 [place] Record Number: [id number] Patient Account Number: [id number] Date of Birth/Sex: Treating RN: 1961-03-22 (61 y.o. [name], [name] Primary Care Provider: [name] Other Clinician: Referring Provider: Treating Provider/Extender: [name] in Treatment: 3 Information Obtained from: Patient Chief Complaint Bilateral Leg Ulcers Electronic Signature(s) Signed: 09/15/2021 10:33:29 AM By: [name] PA-C Previous Signature: 09/15/2021 10:28:14 AM Version By: [name] PA-C Entered By: [name] on 09/15/2021 10:33:29 -------------------------------------------------------------------------------- HPI Details Patient Name: Date of Service: [name], [name] 09/15/2021 10:00 [place] Record Number: [id number] Patient Account Number: [id number] Date of Birth/Sex: Treating RN: 01/16/1961 (61 y.o. [name], [name] Primary Care Provider: [name] Other Clinician: Referring Provider: Treating Provider/Extender: [name] in Treatment: 3 History of Present Illness HPI Description: 02/24/2020 on evaluation today patient presents with obvious chronic lymphedema of the bilateral lower extremities. Fortunately there is no signs of active infection at this time which is great news. With that being said the patient does have a recent treatment with doxycycline 100 mg for 10 days which he has completed. He does have a history of chronic kidney disease stage III as documented in his chart. He also has chronic venous insufficiency with lower extremity lymphedema noted. He has congestive heart failure and hypertension. He tells me currently that he was unaware of the kidney disease before he mentioned this today. He also tells me that his primary care provider had  told him to quit taking showers based on what was going on with his legs at this time. Fortunately there is no signs of active infection at this time which is great news. No fevers, chills, nausea, vomiting, or diarrhea. 03/04/2020 on evaluation today patient actually appears to be doing much better in regard to his legs. There is really not much draining a lot of the dry skin is starting to loosen up amount of time to get as much of this off today as possible 03/11/2020 upon evaluation today patient appears to be doing well currently in regard to his legs. He still has a lot of dry skin some which I was able to get off today but overall I think that the biggest thing he needs right now is to be able to wash his legs in the shower to loosen some of this up which I think will then come off much more effectively and quickly without as much of an issue. Fortunately there is no evidence of active infection at this time. No fevers, chills, nausea, vomiting, or diarrhea. Readmission: 06/05/2020 upon evaluation today patient appears to be doing worse in regard to his bilateral lower extremities. Unfortunately he has had some issues here with his leg started to swell again he tells me he was not able to wear the compression stockings that he got from elastic therapy. He states when he called the leg he said that the measurements did not seem right and subsequently just sent him something she thought would work. Either way he tells me they were very tight and very uncomfortable he just was not able to stick with it. Fortunately there is no signs of active infection at this time. No fevers, chills, nausea, vomiting, or diarrhea. 06/12/2020 on evaluation today patient actually appears to be doing well in regard to his legs. He has a lot of dry skin but nothing  that appears to be open at this point. He did not get the Velcro compression wraps but does have the pull up compression stockings which if he wears those can  definitely be sufficient here. Readmission: 08-25-2021 upon evaluation today patient appears to be doing well with regard to his legs all things considered. With that being said I do not see anything that actively looks like it is open or draining but I definitely see spots where he had blistering and obvious signs of drainage. Subsequently he is supposed to be having open heart surgery to replace a valve that sounds like but they are not able to do that simply due to the fact that right now he is having more issues currently with his legs and they will not do anything until that is resolved. Fortunately I do not see any evidence of active infection locally or systemically which is great news. No fevers, chills, nausea, vomiting, or diarrhea. The patient does have a history noted previously of lymphedema, chronic venous hypertension, chronic kidney disease stage III, congestive heart failure, and hypertension. 09-01-2021 upon evaluation today patient actually appears to be healed I do not see anything open at this time which is great news. Fortunately he is doing excellent and I think they were on the right track. With that being said there were a couple areas where he was so dry that the wrap actually stuck to his leg and this has caused some issues here with a couple small openings remaining but I think by next week these will be sealed up. Overall I think that he needs some lotion twice a day and Ace wraps are probably to be the best way to go to help with compression for now. He does have the vascular appointment upcoming in a couple of days. 6/7; patient comes in today with quite a deterioration from last week. Coupled with this is that he had his arterial studies which were really not very good on the right his ABI was 0.92 and on the left 1.09 but TBI was 0.52 on the right and 0.36 on the left he had monophasic waveforms almost diffusely on the right with an estimated 50 to 74% stenosis of the  distal SFA on the left he had similar findings. 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 We are working on getting him a vascular evaluation./Consultation Also looking through his chart he has a lot of other medical problems including severe aortic stenosis and he has been referred to cardiac surgery. He has atrial fibrillation on Eliquis 09-15-2021 upon evaluation today patient appears to be doing better than it sounds like he was last week. Fortunately I do not see any evidence of active infection locally or systemically which is great news. No fevers, chills, nausea, vomiting, or diarrhea. He does have an appointment with vascular tomorrow. Electronic Signature(s) Signed: 09/15/2021 10:56:41 AM By: [name] PA-C Entered By: [name] on 09/15/2021 10:56:40 -------------------------------------------------------------------------------- Physical Exam Details Patient Name: Date of Service: [name], [name] 09/15/2021 10:00 [place] Medical Record Number: [id number] Patient Account Number: [id number] Date of Birth/Sex: Treating RN: 01-08-1961 (61 y.o. [name], [name] Primary Care Provider: [name] Other Clinician: Referring Provider: Treating Provider/Extender: [name] in Treatment: 3 Constitutional Well-nourished and well-hydrated in no acute distress. Respiratory normal breathing without difficulty. Psychiatric this  patient is able to make decisions and demonstrates good insight into disease process. Alert and Oriented x 3. pleasant and cooperative. Notes Upon inspection patient's wound bed actually showed signs of good granulation and epithelization at this point. Fortunately I do not see any evidence of infection locally or systemically which is great and overall I am extremely  pleased with where we stand today. Electronic Signature(s) Signed: 09/15/2021 10:56:54 AM By: [name] PA-C Entered By: [name] on 09/15/2021 10:56:54 -------------------------------------------------------------------------------- Physician Orders Details Patient Name: Date of Service: [name], [name] 09/15/2021 10:00 [place] Record Number: [id number] Patient Account Number: [id number] Date of Birth/Sex: Treating RN: 1960-11-07 (61 y.o. [name], [name] Primary Care Provider: [name] Other Clinician: Referring Provider: Treating Provider/Extender: [name] in Treatment: 3 Verbal / Phone Orders: No Diagnosis Coding ICD-10 Coding Code Description I89.0 Lymphedema, not elsewhere classified I87.333 Chronic venous hypertension (idiopathic) with ulcer and inflammation of bilateral lower extremity L97.822 Non-pressure chronic ulcer of other part of left lower leg with fat layer exposed L97.812 Non-pressure chronic ulcer of other part of right lower leg with fat layer exposed N18.30 Chronic kidney disease, stage 3 unspecified I50.42 Chronic combined systolic (congestive) and diastolic (congestive) heart failure I10 Essential (primary) hypertension 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 - Wed. 09/22/21 @ 1000 w/ [place], [place] # 9 Return A Nurse Visit: - 09/16/21 Thurs. @ 1230 w/ [place] Room # 9 ****PT TO CALL and CANCEL APPT IF VVS LEAVES WRAPS ON**** . . 09/20/21 Wed. @ [place] # 7 Bathing/ Shower/ Hygiene May shower with protection but do not get wound dressing(s) wet. Edema Control - Lymphedema / SCD / Other Elevate legs to the level of the heart or above for 30 minutes daily and/or when sitting, a frequency of: Avoid  standing for long periods of time. Wound Treatment Wound #5 - Lower Leg Wound Laterality: Right, Circumferential Cleanser: Soap and Water 2 x Per Week/7 Days Discharge Instructions: May shower and wash wound with dial antibacterial soap and water prior to dressing change. Peri-Wound Care: Triamcinolone 15 (g) 2 x Per Week/7 Days Discharge Instructions: Use triamcinolone 15 (g) as directed Peri-Wound Care: Zinc Oxide Ointment 30g tube 2 x Per Week/7 Days Discharge Instructions: Apply Zinc Oxide to periwound with each dressing change Peri-Wound Care: Sween Lotion (Moisturizing lotion) 2 x Per Week/7 Days Discharge Instructions: Apply moisturizing lotion as directed Prim Dressing: KerraCel Ag Gelling Fiber Dressing, 4x5 in (silver alginate) 2 x Per Week/7 Days ary Discharge Instructions: Apply silver alginate to wound bed as instructed Secondary Dressing: ABD Pad, 5x9 2 x Per Week/7 Days Discharge Instructions: Apply over primary dressing as directed. Compression Wrap: Kerlix Roll 4.5x3.1 (in/yd) 2 x Per Week/7 Days Discharge Instructions: Apply Kerlix and Coban compression as directed. Compression Wrap: Coban Self-Adherent Wrap 4x5 (in/yd) 2 x Per Week/7 Days Discharge Instructions: Apply over Kerlix as directed. Wound #6 - Lower Leg Wound Laterality: Left, Circumferential Cleanser: Soap and Water Discharge Instructions: May shower and wash wound with dial antibacterial soap and water prior to dressing change. Peri-Wound Care: Triamcinolone 15 (g) Discharge Instructions: Use triamcinolone 15 (g) as directed Peri-Wound Care: Zinc Oxide Ointment 30g tube Discharge Instructions: Apply Zinc Oxide to periwound with each dressing change Peri-Wound Care: Sween Lotion (Moisturizing lotion) Discharge Instructions:  Apply moisturizing lotion as directed Prim Dressing: KerraCel Ag Gelling Fiber Dressing, 4x5 in (silver alginate) ary Discharge Instructions: Apply silver alginate to wound bed as  instructed Secondary Dressing: ABD Pad, 5x9 Discharge Instructions: Apply over primary dressing as directed. Compression Wrap: Kerlix Roll 4.5x3.1 (in/yd) Discharge Instructions: Apply Kerlix and Coban compression as directed. Compression Wrap: Coban Self-Adherent Wrap 4x5 (in/yd) Discharge Instructions: Apply over Kerlix as directed. Electronic Signature(s) Signed: 09/15/2021 5:11:12 PM By: [name] PA-C Signed: 09/16/2021 5:29:07 PM By: Rhae Hammock RN Entered By: Rhae Hammock on 09/15/2021 11:20:27 -------------------------------------------------------------------------------- Problem List Details Patient Name: Date of Service: [name], [name] 09/15/2021 10:00 [place] Record Number: [id number] Patient Account Number: [id number] Date of Birth/Sex: Treating RN: Jan 23, 1961 (61 y.o. [name], [name] Primary Care Provider: [name] Other Clinician: Referring Provider: Treating Provider/Extender: [name] in Treatment: 3 Active Problems ICD-10 Encounter Code Description Active Date MDM Diagnosis I89.0 Lymphedema, not elsewhere classified 08/25/2021 No Yes I87.333 Chronic venous hypertension (idiopathic) with ulcer and inflammation of 08/25/2021 No Yes bilateral lower extremity L97.822 Non-pressure chronic ulcer of other part of left lower leg with fat layer exposed5/24/2023 No Yes L97.812 Non-pressure chronic ulcer of other part of right lower leg with fat layer 08/25/2021 No Yes exposed N18.30 Chronic kidney disease, stage 3 unspecified 08/25/2021 No Yes I50.42 Chronic combined systolic (congestive) and diastolic (congestive) heart failure 08/25/2021 No Yes I10 Essential (primary) hypertension 08/25/2021 No Yes 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, right leg 09/08/2021 No Yes I70.248 Atherosclerosis of native arteries of left leg with ulceration of other part of 09/08/2021 No Yes lower leg Inactive  Problems Resolved Problems Electronic Signature(s) Signed: 09/15/2021 10:28:04 AM By: [name] PA-C Entered By: [name] on 09/15/2021 10:28:04 -------------------------------------------------------------------------------- Progress Note Details Patient Name: Date of Service: [name], [name] 09/15/2021 10:00 A M Medical Record Number: [id number] Patient Account Number: [id number] Date of Birth/Sex: Treating RN: 02-20-1961 (61 y.o. [name], [name] Primary Care Provider: [name] Other Clinician: Referring Provider: Treating Provider/Extender: [name] in Treatment: 3 Subjective Chief Complaint Information obtained from Patient Bilateral Leg Ulcers History of Present Illness (HPI) 02/24/2020 on evaluation today patient presents with obvious chronic lymphedema of the bilateral lower extremities. Fortunately there is no signs of active infection at this time which is great news. With that being said the patient does have a recent treatment with doxycycline 100 mg for 10 days which he has completed. He does have a history of chronic kidney disease stage III as documented in his chart. He also has chronic venous insufficiency with lower extremity lymphedema noted. He has congestive heart failure and hypertension. He tells me currently that he was unaware of the kidney disease before he mentioned this today. He also tells me that his primary care provider had told him to quit taking showers based on what was going on with his legs at this time. Fortunately there is no signs of active infection at this time which is great news. No fevers, chills, nausea, vomiting, or diarrhea. 03/04/2020 on evaluation today patient actually appears to be doing much better in regard to his legs. There is really not much draining a lot of the dry skin is starting to loosen up amount of time to get as much of this off today as possible 03/11/2020 upon evaluation today  patient appears to be doing well currently in regard to his legs. He still has a lot of dry  skin some which I was able to get off today but overall I think that the biggest thing he needs right now is to be able to wash his legs in the shower to loosen some of this up which I think will then come off much more effectively and quickly without as much of an issue. Fortunately there is no evidence of active infection at this time. No fevers, chills, nausea, vomiting, or diarrhea. Readmission: 06/05/2020 upon evaluation today patient appears to be doing worse in regard to his bilateral lower extremities. Unfortunately he has had some issues here with his leg started to swell again he tells me he was not able to wear the compression stockings that he got from elastic therapy. He states when he called the leg he said that the measurements did not seem right and subsequently just sent him something she thought would work. Either way he tells me they were very tight and very uncomfortable he just was not able to stick with it. Fortunately there is no signs of active infection at this time. No fevers, chills, nausea, vomiting, or diarrhea. 06/12/2020 on evaluation today patient actually appears to be doing well in regard to his legs. He has a lot of dry skin but nothing that appears to be open at this point. He did not get the Velcro compression wraps but does have the pull up compression stockings which if he wears those can definitely be sufficient here. Readmission: 08-25-2021 upon evaluation today patient appears to be doing well with regard to his legs all things considered. With that being said I do not see anything that actively looks like it is open or draining but I definitely see spots where he had blistering and obvious signs of drainage. Subsequently he is supposed to be having open heart surgery to replace a valve that sounds like but they are not able to do that simply due to the fact that right now  he is having more issues currently with his legs and they will not do anything until that is resolved. Fortunately I do not see any evidence of active infection locally or systemically which is great news. No fevers, chills, nausea, vomiting, or diarrhea. The patient does have a history noted previously of lymphedema, chronic venous hypertension, chronic kidney disease stage III, congestive heart failure, and hypertension. 09-01-2021 upon evaluation today patient actually appears to be healed I do not see anything open at this time which is great news. Fortunately he is doing excellent and I think they were on the right track. With that being said there were a couple areas where he was so dry that the wrap actually stuck to his leg and this has caused some issues here with a couple small openings remaining but I think by next week these will be sealed up. Overall I think that he needs some lotion twice a day and Ace wraps are probably to be the best way to go to help with compression for now. He does have the vascular appointment upcoming in a couple of days. 6/7; patient comes in today with quite a deterioration from last week. Coupled with this is that he had his arterial studies which were really not very good on the right his ABI was 0.92 and on the left 1.09 but TBI was 0.52 on the right and 0.36 on the left he had monophasic waveforms almost diffusely on the right with an estimated 50 to 74% stenosis of the distal SFA on the left he  had similar findings. 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 We are working on getting him a vascular evaluation./Consultation Also looking through his chart he has a lot of other medical problems including severe aortic stenosis and he has been referred to cardiac surgery. He has atrial fibrillation on  Eliquis 09-15-2021 upon evaluation today patient appears to be doing better than it sounds like he was last week. Fortunately I do not see any evidence of active infection locally or systemically which is great news. No fevers, chills, nausea, vomiting, or diarrhea. He does have an appointment with vascular tomorrow. Objective Constitutional Well-nourished and well-hydrated in no acute distress. Vitals Time Taken: 10:43 AM, Height: 69 in, Weight: 215 lbs, BMI: 31.7, Temperature: 98.7 F, Pulse: 74 bpm, Respiratory Rate: 17 breaths/min, Blood Pressure: 148/95 mmHg. Respiratory normal breathing without difficulty. Psychiatric this patient is able to make decisions and demonstrates good insight into disease process. Alert and Oriented x 3. pleasant and cooperative. General Notes: Upon inspection patient's wound bed actually showed signs of good granulation and epithelization at this point. Fortunately I do not see any evidence of infection locally or systemically which is great and overall I am extremely pleased with where we stand today. Integumentary (Hair, Skin) Wound #5 status is Open. Original cause of wound was Gradually Appeared. The date acquired was: 07/03/2021. The wound has been in treatment 3 weeks. The wound is located on the Right,Circumferential Lower Leg. The wound measures 31cm length x 38cm width x 0.1cm depth; 925.199cm^2 area and 92.52cm^3 volume. There is Fat Layer (Subcutaneous Tissue) exposed. There is no tunneling or undermining noted. There is a large amount of serosanguineous drainage noted. Foul odor after cleansing was noted. The wound margin is distinct with the outline attached to the wound base. There is medium (34-66%) red, pink granulation within the wound bed. There is a medium (34-66%) amount of necrotic tissue within the wound bed including Adherent Slough. Wound #6 status is Open. Original cause of wound was Gradually Appeared. The date acquired was: 07/03/2021. The  wound has been in treatment 3 weeks. The wound is located on the Left,Circumferential Lower Leg. The wound measures 31cm length x 38cm width x 0.1cm depth; 925.199cm^2 area and 92.52cm^3 volume. There is no tunneling or undermining noted. There is a large amount of serosanguineous drainage noted. Foul odor after cleansing was noted. The wound margin is distinct with the outline attached to the wound base. There is medium (34-66%) red, pink granulation within the wound bed. There is a medium (34-66%) amount of necrotic tissue within the wound bed including Adherent Slough. 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 I would recommend that we going to continue with the wound care measures as before and the patient is in agreement with the plan. This includes the use of the silver alginate dressing to any open areas. 2. We  will continue to cover this with the ABD pad and then the Curlex and Coban wraps. 3. He is going to be seeing vascular tomorrow and we will see what the plan is going forward likely is good to be an angiogram and then intervention as needed. We will see patient back for reevaluation in 1 week here in the clinic. If anything worsens or changes patient will contact our office for additional recommendations. Electronic Signature(s) Signed: 09/15/2021 10:57:28 AM By: [name] PA-C Entered By: [name] on 09/15/2021 10:57:28 -------------------------------------------------------------------------------- SuperBill Details Patient Name: Date of Service: [name], [name] 09/15/2021 Medical Record Number: [id number] Patient Account Number: [id number] Date of Birth/Sex: Treating RN: 02/27/1961 (61 y.o. 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 Essential (primary) hypertension 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, 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 Facility Procedures CPT4 Code: [id number] Description: 99214 - WOUND CARE VISIT-LEV 4 EST PT Modifier: Quantity: 1 Physician Procedures : CPT4 Code Description Modifier [id number] - WC PHYS LEVEL 3 - EST PT ICD-10 Diagnosis Description I89.0 Lymphedema, not elsewhere classified 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

## 2021-09-16 ENCOUNTER — Encounter: Payer: Self-pay | Admitting: Cardiology

## 2021-09-16 ENCOUNTER — Ambulatory Visit (INDEPENDENT_AMBULATORY_CARE_PROVIDER_SITE_OTHER): Payer: Medicare Other | Admitting: Vascular Surgery

## 2021-09-16 ENCOUNTER — Ambulatory Visit (INDEPENDENT_AMBULATORY_CARE_PROVIDER_SITE_OTHER): Payer: Medicare Other | Admitting: Cardiology

## 2021-09-16 ENCOUNTER — Encounter: Payer: Self-pay | Admitting: Vascular Surgery

## 2021-09-16 ENCOUNTER — Ambulatory Visit (INDEPENDENT_AMBULATORY_CARE_PROVIDER_SITE_OTHER): Payer: Medicare Other

## 2021-09-16 ENCOUNTER — Encounter (HOSPITAL_BASED_OUTPATIENT_CLINIC_OR_DEPARTMENT_OTHER): Payer: Medicare Other | Admitting: Internal Medicine

## 2021-09-16 VITALS — BP 123/76 | HR 83 | Temp 98.0°F | Resp 20 | Ht 69.0 in | Wt 209.5 lb

## 2021-09-16 VITALS — BP 120/90 | HR 84 | Ht 69.0 in | Wt 209.0 lb

## 2021-09-16 DIAGNOSIS — I5032 Chronic diastolic (congestive) heart failure: Secondary | ICD-10-CM | POA: Diagnosis not present

## 2021-09-16 DIAGNOSIS — I4819 Other persistent atrial fibrillation: Secondary | ICD-10-CM

## 2021-09-16 DIAGNOSIS — L97909 Non-pressure chronic ulcer of unspecified part of unspecified lower leg with unspecified severity: Secondary | ICD-10-CM | POA: Diagnosis not present

## 2021-09-16 DIAGNOSIS — I70299 Other atherosclerosis of native arteries of extremities, unspecified extremity: Secondary | ICD-10-CM

## 2021-09-16 DIAGNOSIS — I350 Nonrheumatic aortic (valve) stenosis: Secondary | ICD-10-CM

## 2021-09-16 DIAGNOSIS — I872 Venous insufficiency (chronic) (peripheral): Secondary | ICD-10-CM | POA: Diagnosis not present

## 2021-09-16 DIAGNOSIS — I10 Essential (primary) hypertension: Secondary | ICD-10-CM

## 2021-09-16 DIAGNOSIS — E785 Hyperlipidemia, unspecified: Secondary | ICD-10-CM

## 2021-09-16 DIAGNOSIS — I739 Peripheral vascular disease, unspecified: Secondary | ICD-10-CM

## 2021-09-16 NOTE — Progress Notes (Signed)
[name], [name] ([id number]) Visit Report for 09/13/2021 Arrival Information Details Patient Name: Date of Service: KYCE, GING 09/13/2021 10:00 A M Medical Record Number: [id number] Patient Account Number: [id number] Date of Birth/Sex: Treating RN: 1960/04/19 (61 y.o. [name], [name] Primary Care Kwanza Cancelliere: [name] Other Clinician: Referring Miriana Gaertner: Treating Eastin Swing/Extender: Everardo Beals in Treatment: 2 Visit Information History Since Last Visit Added or deleted any medications: No Patient Arrived: Ambulatory Any new allergies or adverse reactions: No Arrival Time: 10:02 Had a fall or experienced change in No Accompanied By: self activities of daily living that may affect Transfer Assistance: Manual risk of falls: Patient Identification Verified: Yes Signs or symptoms of abuse/neglect since last visito No Secondary Verification Process Completed: Yes Hospitalized since last visit: No Patient Requires Transmission-Based Precautions: No Implantable device outside of the clinic excluding No Patient Has Alerts: No cellular tissue based products placed in the center since last visit: Has Dressing in Place as Prescribed: Yes Pain Present Now: No Electronic Signature(s) Signed: 09/16/2021 5:29:07 PM By: Fonnie Mu RN Entered By: Fonnie Mu on 09/13/2021 10:03:00 -------------------------------------------------------------------------------- Clinic Level of Care Assessment Details Patient Name: Date of Service: MORDCHE, [name] 09/13/2021 10:00 A M Medical Record Number: [id number] Patient Account Number: [id number] Date of Birth/Sex: Treating RN: May 02, 1960 (61 y.o. [name], [name] Primary Care Joshalyn Ancheta: [name] Other Clinician: Referring [name]: Treating [name]/Extender: Everardo Beals in Treatment: 2 Clinic Level of Care Assessment Items TOOL 4 Quantity Score X- 1 0 Use when only an EandM  is performed on FOLLOW-UP visit ASSESSMENTS - Nursing Assessment / Reassessment X- 1 10 Reassessment of Co-morbidities (includes updates in patient status) X- 1 5 Reassessment of Adherence to Treatment Plan ASSESSMENTS - Wound and Skin A ssessment / Reassessment []  - 0 Simple Wound Assessment / Reassessment - one wound X- 2 5 Complex Wound Assessment / Reassessment - multiple wounds []  - 0 Dermatologic / Skin Assessment (not related to wound area) ASSESSMENTS - Focused Assessment []  - 0 Circumferential Edema Measurements - multi extremities []  - 0 Nutritional Assessment / Counseling / Intervention []  - 0 Lower Extremity Assessment (monofilament, tuning fork, pulses) []  - 0 Peripheral Arterial Disease Assessment (using hand held doppler) ASSESSMENTS - Ostomy and/or Continence Assessment and Care []  - 0 Incontinence Assessment and Management []  - 0 Ostomy Care Assessment and Management (repouching, etc.) PROCESS - Coordination of Care []  - 0 Simple Patient / Family Education for ongoing care X- 1 20 Complex (extensive) Patient / Family Education for ongoing care X- 1 10 Staff obtains , Records, T Results / Process Orders est []  - 0 Staff telephones HHA, Nursing Homes / Clarify orders / etc []  - 0 Routine Transfer to another Facility (non-emergent condition) []  - 0 Routine Hospital Admission (non-emergent condition) []  - 0 New Admissions / / Ordering NPWT Apligraf, etc. , []  - 0 Emergency Hospital Admission (emergent condition) []  - 0 Simple Discharge Coordination X- 1 15 Complex (extensive) Discharge Coordination PROCESS - Special Needs []  - 0 Pediatric / Minor Patient Management []  - 0 Isolation Patient Management []  - 0 Hearing / Language / Visual special needs []  - 0 Assessment of Community assistance (transportation, D/C planning, etc.) []  - 0 Additional assistance / Altered mentation []  - 0 Support Surface(s) Assessment  (bed, cushion, seat, etc.) INTERVENTIONS - Wound Cleansing / Measurement []  - 0 Simple Wound Cleansing - one wound X- 2 5 Complex Wound Cleansing - multiple wounds []  - 0 Wound Imaging (  photographs - any number of wounds) []  - 0 Wound Tracing (instead of photographs) []  - 0 Simple Wound Measurement - one wound X- 2 5 Complex Wound Measurement - multiple wounds INTERVENTIONS - Wound Dressings []  - 0 Small Wound Dressing one or multiple wounds X- 2 15 Medium Wound Dressing one or multiple wounds []  - 0 Large Wound Dressing one or multiple wounds []  - 0 Application of Medications - topical []  - 0 Application of Medications - injection INTERVENTIONS - Miscellaneous []  - 0 External ear exam []  - 0 Specimen Collection (cultures, biopsies, blood, body fluids, etc.) []  - 0 Specimen(s) / Culture(s) sent or taken to Lab for analysis []  - 0 Patient Transfer (multiple staff / / Similar devices) []  - 0 Simple Staple / Suture removal (25 or less) []  - 0 Complex Staple / Suture removal (26 or more) []  - 0 Hypo / Hyperglycemic Management (close monitor of Blood Glucose) []  - 0 Ankle / Brachial Index (ABI) - do not check if billed separately X- 1 5 Vital Signs Has the patient been seen at the hospital within the last three years: Yes Total Score: 125 Level Of Care: New/Established - Level 4 Electronic Signature(s) Signed: 09/16/2021 5:29:07 PM By: RN Entered By: on 09/13/2021 10:04:30 -------------------------------------------------------------------------------- Encounter Discharge Information Details Patient Name: Date of Service: Shawn, Small 09/13/2021 10:00 A M Medical Record Number: Patient Account Number: Date of Birth/Sex: Treating RN: 1961/02/22 (61 y.o. , [name] Primary Care Devynne Sturdivant: Other Clinician: Referring Kayl Stogdill: Treating Sidni Fusco/Extender: in Treatment: 2 Encounter Discharge Information Items Discharge Condition: Stable Ambulatory Status: Ambulatory Discharge Destination: Home Transportation: Private Auto Accompanied By: self Schedule Follow-up Appointment: Yes Clinical Summary of Care: Patient Declined Electronic Signature(s) Signed: 09/16/2021 5:29:07 PM By: 09/18/2021 RN Entered By: Fonnie Mu on 09/13/2021 10:03:52 -------------------------------------------------------------------------------- Patient/Caregiver Education Details Patient Name: Date of Service: 11/13/2021 6/12/2023andnbsp10:00 A M Medical Record Number: 11/13/2021 Patient Account Number: [id number] Date of Birth/Gender: Treating RN: 1960-05-10 (61 y.o. 76, [name] Primary Care Physician: [name] Other Clinician: Referring Physician: Treating Physician/Extender: [name] in Treatment: 2 Education Assessment Education Provided To: Patient Education Topics Provided Wound/Skin Impairment: Methods: Explain/Verbal Responses: State content correctly Electronic Signature(s) Signed: 09/16/2021 5:29:07 PM By: 09/18/2021 RN Entered By: Fonnie Mu on 09/13/2021 10:03:41 -------------------------------------------------------------------------------- Wound Assessment Details Patient Name: Date of Service: ARLYN, BUERKLE 09/13/2021 10:00 A M Medical Record Number: 11/13/2021 Patient Account Number: [id number] Date of Birth/Sex: Treating RN: 10-Nov-1960 (61 y.o. 76, [name] Primary Care Naomia Lenderman: [name] Other Clinician: Referring Deuce Paternoster: Treating Caden Fatica/Extender: [name] in Treatment: 2 Wound Status Wound Number: 5 Primary Etiology: Venous Leg Ulcer Wound Location: Right, Circumferential Lower Leg Wound Status: Open Wounding Event: Gradually Appeared Date Acquired: 07/03/2021 Weeks Of  Treatment: 2 Clustered Wound: No Wound Measurements Length: (cm) 31 Width: (cm) 38 Depth: (cm) 0.1 Area: (cm) 925.199 Volume: (cm) 92.52 % Reduction in Area: 0% % Reduction in Volume: 0% Wound Description Classification: Full Thickness With Exposed Support Structure Exudate Amount: Medium Exudate Type: Serosanguineous Exudate Color: red, brown s Electronic Signature(s) Signed: 09/16/2021 5:29:07 PM By: Fonnie Mu RN Entered By: Fonnie Mu on 09/13/2021 10:03:25 -------------------------------------------------------------------------------- Wound Assessment Details Patient Name: Date of Service: CANDICE, LUNNEY 09/13/2021 10:00 A M Medical Record Number: [id number] Patient Account Number: [id number] Date of Birth/Sex: Treating RN: [birth year] (61 y.o. [name], [name] Primary  Care Ifeanyi Mickelson: [name] Other Clinician: Referring Karson Chicas: Treating Janne Faulk/Extender: Everardo Beals in Treatment: 2 Wound Status Wound Number: 6 Primary Etiology: Venous Leg Ulcer Wound Location: Left, Circumferential Lower Leg Wound Status: Open Wounding Event: Gradually Appeared Date Acquired: 07/03/2021 Weeks Of Treatment: 2 Clustered Wound: No Wound Measurements Length: (cm) 31 Width: (cm) 38 Depth: (cm) 0.1 Area: (cm) 925.199 Volume: (cm) 92.52 % Reduction in Area: -18.7% % Reduction in Volume: -18.8% Wound Description Classification: Full Thickness With Exposed Support Structure Exudate Amount: Medium Exudate Type: Serosanguineous Exudate Color: red, brown s Electronic Signature(s) Signed: 09/16/2021 5:29:07 PM By: Fonnie Mu RN Entered By: Fonnie Mu on 09/13/2021 10:03:25 -------------------------------------------------------------------------------- Vitals Details Patient Name: Date of Service: MERVILLE, HIJAZI 09/13/2021 10:00 A M Medical Record Number: [id number] Patient Account Number: [id number] Date of  Birth/Sex: Treating RN: 03/17/1961 (61 y.o. [name], [name] Primary Care Hamdi Kley: [name] Other Clinician: Referring Om Lizotte: Treating Jos Cygan/Extender: Everardo Beals in Treatment: 2 Vital Signs Time Taken: 10:00 Temperature (F): 98.7 Height (in): 69 Pulse (bpm): 74 Weight (lbs): 215 Respiratory Rate (breaths/min): 17 Body Mass Index (BMI): 31.7 Blood Pressure (mmHg): 144/74 Reference Range: 80 - 120 mg / dl Electronic Signature(s) Signed: 09/16/2021 5:29:07 PM By: Fonnie Mu RN Entered By: Fonnie Mu on 09/13/2021 10:03:14
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
## 2021-09-16 NOTE — Patient Instructions (Addendum)
Medication Instructions:  Your physician recommends that you continue on your current medications as directed. Please refer to the Current Medication list given to you today.  *If you need a refill on your cardiac medications before your next appointment, please call your pharmacy*   Lab Work: BMET, Mag, BNP today   If you have labs (blood work) drawn today and your tests are completely normal, you will receive your results only by: MyChart Message (if you have MyChart) OR A paper copy in the mail If you have any lab test that is abnormal or we need to change your treatment, we will call you to review the results.   Procedures:  ZIO XT- Long Term Monitor Instructions   Your physician has requested you wear a ZIO patch monitor for _3_ days.  This is a single patch monitor.   [employer] supplies one patch monitor per enrollment. Additional stickers are not available. Please do not apply patch if you will be having a Nuclear Stress Test, Echocardiogram, Cardiac CT, MRI, or Chest Xray during the period you would be wearing the monitor. The patch cannot be worn during these tests. You cannot remove and re-apply the ZIO XT patch monitor.  Your ZIO patch monitor will be sent Fed Ex from [employer] directly to your home address. It may take 3-5 days to receive your monitor after you have been enrolled.  Once you have received your monitor, please review the enclosed instructions. Your monitor has already been registered assigning a specific monitor serial # to you.  Billing and Patient Assistance Program Information   We have supplied [employer] with any of your insurance information on file for billing purposes. [employer] offers a sliding scale Patient Assistance Program for patients that do not have insurance, or whose insurance does not completely cover the cost of the ZIO monitor.   You must apply for the Patient Assistance Program to qualify for this discounted rate.     To apply, please  call [employer] at [phone], select option 4, then select option 2, and ask to apply for Patient Assistance Program.  Meredeth Ide will ask your household income, and how many people are in your household.  They will quote your out-of-pocket cost based on that information.  [employer] will also be able to set up a 44-month, interest-free payment plan if needed.  Applying the monitor   Shave hair from upper left chest.  Hold abrader disc by orange tab. Rub abrader in 40 strokes over the upper left chest as indicated in your monitor instructions.  Clean area with 4 enclosed alcohol pads. Let dry.  Apply patch as indicated in monitor instructions. Patch will be placed under collarbone on left side of chest with arrow pointing upward.  Rub patch adhesive wings for 2 minutes. Remove white label marked "1". Remove the white label marked "2". Rub patch adhesive wings for 2 additional minutes.  While looking in a mirror, press and release button in center of patch. A small green light will flash 3-4 times. This will be your only indicator that the monitor has been turned on. ?  Do not shower for the first 24 hours. You may shower after the first 24 hours.  Press the button if you feel a symptom. You will hear a small click. Record Date, Time and Symptom in the Patient Logbook.  When you are ready to remove the patch, follow instructions on the last 2 pages of the Patient Logbook. Stick patch monitor onto the last page  of Patient Logbook.  Place Patient Logbook in the blue and white box.  Use locking tab on box and tape box closed securely.  The blue and white box has prepaid postage on it. Please place it in the mailbox as soon as possible. Your physician should have your test results approximately 7 days after the monitor has been mailed back to [facility].  Call [facility] Customer Care at [phone] if you have questions regarding your ZIO XT patch monitor. Call them immediately if you see an orange  light blinking on your monitor.  If your monitor falls off in less than 4 days, contact our Monitor department at [phone]. ?If your monitor becomes loose or falls off after 4 days call [employer] at [phone] for suggestions on securing your monitor.?  Follow-Up: At [facility], you and your health needs are our priority.  As part of our continuing mission to provide you with exceptional heart care, we have created designated Provider Care Teams.  These Care Teams include your primary Cardiologist (physician) and Advanced Practice Providers (APPs -  Physician Assistants and Nurse Practitioners) who all work together to provide you with the care you need, when you need it.  We recommend signing up for the patient portal called "MyChart".  Sign up information is provided on this After Visit Summary.  MyChart is used to connect with patients for Virtual Visits (Telemedicine).  Patients are able to view lab/test results, encounter notes, upcoming appointments, etc.  Non-urgent messages can be sent to your provider as well.   To learn more about what you can do with MyChart, go to [email or web address].    Your next appointment:   6 week(s)  The format for your next appointment:   In Person  Provider:   Little Ishikawa, MD {    Important Information About Sugar

## 2021-09-16 NOTE — Progress Notes (Signed)
STRATTON, VILLWOCK ([id number]) Visit Report for 09/13/2021 SuperBill Details Patient Name: Date of Service: [name], [name] 09/13/2021 Medical Record Number: [id number] Patient Account Number: [id number] Date of Birth/Sex: Treating RN: [birth year] (61 y.o. [name], [name] Primary Care Provider: [name] Other Clinician: Referring Provider: Treating Provider/Extender: Everardo Beals in Treatment: 2 Diagnosis Coding ICD-10 Codes Code Description I89.0 Lymphedema, not elsewhere classified I87.333 Chronic venous hypertension (idiopathic) with ulcer and inflammation of bilateral lower extremity L97.822 Non-pressure chronic ulcer of other part of left lower leg with fat layer exposed L97.812 Non-pressure chronic ulcer of other part of right lower leg with fat layer exposed N18.30 Chronic kidney disease, stage 3 unspecified I50.42 Chronic combined systolic (congestive) and diastolic (congestive) heart failure I10 Essential (primary) hypertension 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, 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 Facility Procedures CPT4 Code Description Modifier Quantity [id number] - WOUND CARE VISIT-LEV 4 EST PT 1 Electronic Signature(s) Signed: 09/13/2021 10:05:17 AM By: Geralyn Corwin DO Signed: 09/16/2021 5:29:07 PM By: Fonnie Mu RN Entered By: Fonnie Mu on 09/13/2021 10:04:36

## 2021-09-16 NOTE — Progress Notes (Unsigned)
Enrolled patient for a 3 day Zio XT monitor to be mailed to patients home  

## 2021-09-16 NOTE — Progress Notes (Signed)
NIKAI, QUEST ([id number]) Visit Report for 09/15/2021 Arrival Information Details Patient Name: Date of Service: [name], [name] 09/15/2021 10:00 A M Medical Record Number: [id number] Patient Account Number: [id number] Date of Birth/Sex: Treating RN: 09-May-1960 (61 y.o. [name], [name] Primary Care Naythen Heikkila: [name] Other Clinician: Referring Dao Mearns: Treating Lamyra Malcolm/Extender: [name] in Treatment: 3 Visit Information History Since Last Visit Added or deleted any medications: No Patient Arrived: Gilmer Mor Any new allergies or adverse reactions: No Arrival Time: 10:19 Had a fall or experienced change in No Accompanied By: self activities of daily living that may affect Transfer Assistance: None risk of falls: Patient Identification Verified: Yes Signs or symptoms of abuse/neglect since last visito No Secondary Verification Process Completed: Yes Hospitalized since last visit: No Patient Requires Transmission-Based Precautions: No Implantable device outside of the clinic excluding No Patient Has Alerts: No cellular tissue based products placed in the center since last visit: Has Dressing in Place as Prescribed: Yes Has Compression in Place as Prescribed: Yes Pain Present Now: No Electronic Signature(s) Signed: 09/16/2021 5:29:07 PM By: Fonnie Mu RN Entered By: Fonnie Mu on 09/15/2021 10:43:10 -------------------------------------------------------------------------------- Clinic Level of Care Assessment Details Patient Name: Date of Service: [name], [name] 09/15/2021 10:00 A M Medical Record Number: [id number] Patient Account Number: [id number] Date of Birth/Sex: Treating RN: Oct 12, 1960 (61 y.o. [name], [name] Primary Care Kendrell Lottman: [name] Other Clinician: Referring Sally-Anne Wamble: Treating Keyona Emrich/Extender: [name] in Treatment: 3 Clinic Level of Care Assessment Items TOOL 4 Quantity  Score X- 1 0 Use when only an EandM is performed on FOLLOW-UP visit ASSESSMENTS - Nursing Assessment / Reassessment X- 1 10 Reassessment of Co-morbidities (includes updates in patient status) X- 1 5 Reassessment of Adherence to Treatment Plan ASSESSMENTS - Wound and Skin A ssessment / Reassessment []  - 0 Simple Wound Assessment / Reassessment - one wound X- 2 5 Complex Wound Assessment / Reassessment - multiple wounds []  - 0 Dermatologic / Skin Assessment (not related to wound area) ASSESSMENTS - Focused Assessment X- 1 5 Circumferential Edema Measurements - multi extremities []  - 0 Nutritional Assessment / Counseling / Intervention []  - 0 Lower Extremity Assessment (monofilament, tuning fork, pulses) []  - 0 Peripheral Arterial Disease Assessment (using hand held doppler) ASSESSMENTS - Ostomy and/or Continence Assessment and Care []  - 0 Incontinence Assessment and Management []  - 0 Ostomy Care Assessment and Management (repouching, etc.) PROCESS - Coordination of Care []  - 0 Simple Patient / Family Education for ongoing care X- 1 20 Complex (extensive) Patient / Family Education for ongoing care X- 1 10 Staff obtains , Records, T Results / Process Orders est X- 1 10 Staff telephones HHA, Nursing Homes / Clarify orders / etc []  - 0 Routine Transfer to another Facility (non-emergent condition) []  - 0 Routine Hospital Admission (non-emergent condition) []  - 0 New Admissions / / Ordering NPWT Apligraf, etc. , []  - 0 Emergency Hospital Admission (emergent condition) []  - 0 Simple Discharge Coordination X- 1 15 Complex (extensive) Discharge Coordination PROCESS - Special Needs []  - 0 Pediatric / Minor Patient Management []  - 0 Isolation Patient Management []  - 0 Hearing / Language / Visual special needs []  - 0 Assessment of Community assistance (transportation, D/C planning, etc.) []  - 0 Additional assistance / Altered  mentation []  - 0 Support Surface(s) Assessment (bed, cushion, seat, etc.) INTERVENTIONS - Wound Cleansing / Measurement []  - 0 Simple Wound Cleansing - one wound X- 2 5 Complex Wound  Cleansing - multiple wounds X- 1 5 Wound Imaging (photographs - any number of wounds) []  - 0 Wound Tracing (instead of photographs) []  - 0 Simple Wound Measurement - one wound X- 2 5 Complex Wound Measurement - multiple wounds INTERVENTIONS - Wound Dressings []  - 0 Small Wound Dressing one or multiple wounds []  - 0 Medium Wound Dressing one or multiple wounds X- 2 20 Large Wound Dressing one or multiple wounds []  - 0 Application of Medications - topical []  - 0 Application of Medications - injection INTERVENTIONS - Miscellaneous []  - 0 External ear exam []  - 0 Specimen Collection (cultures, biopsies, blood, body fluids, etc.) []  - 0 Specimen(s) / Culture(s) sent or taken to Lab for analysis []  - 0 Patient Transfer (multiple staff / / Similar devices) []  - 0 Simple Staple / Suture removal (25 or less) []  - 0 Complex Staple / Suture removal (26 or more) []  - 0 Hypo / Hyperglycemic Management (close monitor of Blood Glucose) []  - 0 Ankle / Brachial Index (ABI) - do not check if billed separately X- 1 5 Vital Signs Has the patient been seen at the hospital within the last three years: Yes Total Score: 155 Level Of Care: New/Established - Level 4 Electronic Signature(s) Signed: 09/16/2021 5:29:07 PM By: RN Entered By: on 09/15/2021 11:22:58 -------------------------------------------------------------------------------- Encounter Discharge Information Details Patient Name: Date of Service: [name], [name] 09/15/2021 10:00 A M Medical Record Number: Patient Account Number: Date of Birth/Sex: Treating RN: 1960/04/17 (61 y.o. , [name] Primary Care Kashmir Leedy: Other Clinician: Referring  Chauntae Hults: Treating Mersadie Kavanaugh/Extender: in Treatment: 3 Encounter Discharge Information Items Discharge Condition: Stable Ambulatory Status: Ambulatory Discharge Destination: Home Transportation: Private Auto Accompanied By: self Schedule Follow-up Appointment: Yes Clinical Summary of Care: Patient Declined Electronic Signature(s) Signed: 09/16/2021 5:29:07 PM By: 09/18/2021 RN Entered By: Fonnie Mu on 09/15/2021 11:23:48 -------------------------------------------------------------------------------- Lower Extremity Assessment Details Patient Name: Date of Service: [name], [name] 09/15/2021 10:00 A M Medical Record Number: 09/17/2021 Patient Account Number: [id number] Date of Birth/Sex: Treating RN: 1960-11-16 (61 y.o. 76, [name] Primary Care Patton Rabinovich: [name] Other Clinician: Referring Amanii Snethen: Treating Rain Wilhide/Extender: [name] in Treatment: 3 Edema Assessment Assessed: [name]: Yes] 09/18/2021: Yes] Edema: [Left: Yes] [Right: Yes] Calf Left: Right: Point of Measurement: From Medial Instep 38 cm 37 cm Ankle Left: Right: Point of Measurement: From Medial Instep 27 cm 27 cm Vascular Assessment Pulses: Dorsalis Pedis Palpable: [Left:Yes] [Right:Yes] Posterior Tibial Palpable: [Left:Yes] [Right:Yes] Electronic Signature(s) Signed: 09/16/2021 5:29:07 PM By: Fonnie Mu RN Entered By: 09/17/2021 on 09/15/2021 10:44:33 -------------------------------------------------------------------------------- Multi-Disciplinary Care Plan Details Patient Name: Date of Service: [name], [name] 09/15/2021 10:00 A M Medical Record Number: [id number] Patient Account Number: 01/04/1961 Date of Birth/Sex: Treating RN: Aug 16, 1960 (61 y.o. [name], [name] Primary Care Barnet Benavides: [name] Other Clinician: Referring Claudy Abdallah: Treating Ruford Dudzinski/Extender: [name] in Treatment: 3 Active Inactive Electronic Signature(s) Signed: 09/16/2021 5:29:07 PM By: 09/18/2021 RN Entered By: Fonnie Mu on 09/15/2021 11:05:18 -------------------------------------------------------------------------------- Pain Assessment Details Patient Name: Date of Service: [name], [name] 09/15/2021 10:00 A M Medical Record Number: 09/17/2021 Patient Account Number: [id number] Date of Birth/Sex: Treating RN: 05/16/1960 (61 y.o. 76, [name] Primary Care Geniya Fulgham: [name] Other Clinician: Referring Calvert Charland: Treating Tiana Sivertson/Extender: [name] in Treatment: 3 Active Problems Location of Pain Severity and Description of Pain  Patient Has Paino No Site Locations Pain Management and Medication Current Pain Management: Electronic Signature(s) Signed: 09/16/2021 5:29:07 PM By: Fonnie Mu RN Entered By: Fonnie Mu on 09/15/2021 10:44:22 -------------------------------------------------------------------------------- Patient/Caregiver Education Details Patient Name: Date of Service: Bobbe Medico 6/14/2023andnbsp10:00 A M Medical Record Number: [id number] Patient Account Number: [id number] Date of Birth/Gender: Treating RN: [birth year] (61 y.o. [name], [name] Primary Care Physician: [name] Other Clinician: Referring Physician: Treating Physician/Extender: [name] in Treatment: 3 Education Assessment Education Provided To: Patient Education Topics Provided Wound/Skin Impairment: Methods: Explain/Verbal Responses: State content correctly Electronic Signature(s) Signed: 09/16/2021 5:29:07 PM By: Fonnie Mu RN Entered By: Fonnie Mu on 09/15/2021 11:21:50 -------------------------------------------------------------------------------- Wound Assessment Details Patient Name: Date of Service: VAIDEN, ADAMES 09/15/2021 10:00 A  M Medical Record Number: [id number] Patient Account Number: [id number] Date of Birth/Sex: Treating RN: [birth year] (61 y.o. [name], [name] Primary Care Clyda Smyth: [name] Other Clinician: Referring Shawnee Gambone: Treating Taneya Conkel/Extender: [name] in Treatment: 3 Wound Status Wound Number: 5 Primary Etiology: Venous Leg Ulcer Wound Location: Right, Circumferential Lower Leg Wound Status: Open Wounding Event: Gradually Appeared Comorbid History: Congestive Heart Failure, Hypertension Date Acquired: 07/03/2021 Weeks Of Treatment: 3 Clustered Wound: No Photos Wound Measurements Length: (cm) 31 Width: (cm) 38 Depth: (cm) 0.1 Area: (cm) 925.199 Volume: (cm) 92.52 % Reduction in Area: 0% % Reduction in Volume: 0% Epithelialization: Small (1-33%) Tunneling: No Undermining: No Wound Description Classification: Full Thickness With Exposed Support Structures Wound Margin: Distinct, outline attached Exudate Amount: Large Exudate Type: Serosanguineous Exudate Color: red, brown Foul Odor After Cleansing: Yes Due to Product Use: No Slough/Fibrino Yes Wound Bed Granulation Amount: Medium (34-66%) Exposed Structure Granulation Quality: Red, Pink Fascia Exposed: No Necrotic Amount: Medium (34-66%) Fat Layer (Subcutaneous Tissue) Exposed: Yes Necrotic Quality: Adherent Slough Tendon Exposed: No Muscle Exposed: No Joint Exposed: No Bone Exposed: No Treatment Notes Wound #5 (Lower Leg) Wound Laterality: Right, Circumferential Cleanser Soap and Water Discharge Instruction: May shower and wash wound with dial antibacterial soap and water prior to dressing change. Peri-Wound Care Triamcinolone 15 (g) Discharge Instruction: Use triamcinolone 15 (g) as directed Zinc Oxide Ointment 30g tube Discharge Instruction: Apply Zinc Oxide to periwound with each dressing change Sween Lotion (Moisturizing lotion) Discharge Instruction: Apply moisturizing  lotion as directed Topical Primary Dressing KerraCel Ag Gelling Fiber Dressing, 4x5 in (silver alginate) Discharge Instruction: Apply silver alginate to wound bed as instructed Secondary Dressing ABD Pad, 5x9 Discharge Instruction: Apply over primary dressing as directed. Secured With Compression Wrap Kerlix Roll 4.5x3.1 (in/yd) Discharge Instruction: Apply Kerlix and Coban compression as directed. Coban Self-Adherent Wrap 4x5 (in/yd) Discharge Instruction: Apply over Kerlix as directed. Compression Stockings Add-Ons Electronic Signature(s) Signed: 09/16/2021 5:29:07 PM By: Fonnie Mu RN Entered By: Fonnie Mu on 09/15/2021 10:45:06 -------------------------------------------------------------------------------- Wound Assessment Details Patient Name: Date of Service: [name], [name] 09/15/2021 10:00 A M Medical Record Number: [id number] Patient Account Number: [id number] Date of Birth/Sex: Treating RN: 1960-07-12 (61 y.o. [name], [name] Primary Care Kahlen Morais: [name] Other Clinician: Referring Sanaya Gwilliam: Treating Dixon Luczak/Extender: [name] in Treatment: 3 Wound Status Wound Number: 6 Primary Etiology: Venous Leg Ulcer Wound Location: Left, Circumferential Lower Leg Wound Status: Open Wounding Event: Gradually Appeared Comorbid History: Congestive Heart Failure, Hypertension Date Acquired: 07/03/2021 Weeks Of Treatment: 3 Clustered Wound: No Photos Wound Measurements Length: (cm) 31 Width: (cm) 38 Depth: (cm) 0.1 Area: (cm) 925.199 Volume: (cm) 92.52 % Reduction in Area: -18.7% % Reduction  in Volume: -18.8% Epithelialization: Small (1-33%) Tunneling: No Undermining: No Wound Description Classification: Full Thickness With Exposed Support Structures Wound Margin: Distinct, outline attached Exudate Amount: Large Exudate Type: Serosanguineous Exudate Color: red, brown Foul Odor After Cleansing: Yes Due to  Product Use: No Slough/Fibrino Yes Wound Bed Granulation Amount: Medium (34-66%) Exposed Structure Granulation Quality: Red, Pink Fascia Exposed: No Necrotic Amount: Medium (34-66%) Fat Layer (Subcutaneous Tissue) Exposed: No Necrotic Quality: Adherent Slough Tendon Exposed: No Muscle Exposed: No Joint Exposed: No Bone Exposed: No Treatment Notes Wound #6 (Lower Leg) Wound Laterality: Left, Circumferential Cleanser Soap and Water Discharge Instruction: May shower and wash wound with dial antibacterial soap and water prior to dressing change. Peri-Wound Care Triamcinolone 15 (g) Discharge Instruction: Use triamcinolone 15 (g) as directed Zinc Oxide Ointment 30g tube Discharge Instruction: Apply Zinc Oxide to periwound with each dressing change Sween Lotion (Moisturizing lotion) Discharge Instruction: Apply moisturizing lotion as directed Topical Primary Dressing KerraCel Ag Gelling Fiber Dressing, 4x5 in (silver alginate) Discharge Instruction: Apply silver alginate to wound bed as instructed Secondary Dressing ABD Pad, 5x9 Discharge Instruction: Apply over primary dressing as directed. Secured With Compression Wrap Kerlix Roll 4.5x3.1 (in/yd) Discharge Instruction: Apply Kerlix and Coban compression as directed. Coban Self-Adherent Wrap 4x5 (in/yd) Discharge Instruction: Apply over Kerlix as directed. Compression Stockings Add-Ons Electronic Signature(s) Signed: 09/16/2021 5:29:07 PM By: Fonnie Mu RN Entered By: Fonnie Mu on 09/15/2021 10:45:39 -------------------------------------------------------------------------------- Vitals Details Patient Name: Date of Service: [name], [name] 09/15/2021 10:00 A M Medical Record Number: [id number] Patient Account Number: [id number] Date of Birth/Sex: Treating RN: Nov 17, 1960 (61 y.o. [name], [name] Primary Care Monzerrat Wellen: [name] Other Clinician: Referring Jash Wahlen: Treating Luisantonio Adinolfi/Extender: [name] in Treatment: 3 Vital Signs Time Taken: 10:43 Temperature (F): 98.7 Height (in): 69 Pulse (bpm): 74 Weight (lbs): 215 Respiratory Rate (breaths/min): 17 Body Mass Index (BMI): 31.7 Blood Pressure (mmHg): 148/95 Reference Range: 80 - 120 mg / dl Electronic Signature(s) Signed: 09/16/2021 5:29:07 PM By: Fonnie Mu RN Entered By: Fonnie Mu on 09/15/2021 10:43:49

## 2021-09-17 LAB — BASIC METABOLIC PANEL
BUN/Creatinine Ratio: 19 (ref 10–24)
BUN: 26 mg/dL (ref 8–27)
CO2: 21 mmol/L (ref 20–29)
Calcium: 9.3 mg/dL (ref 8.6–10.2)
Chloride: 104 mmol/L (ref 96–106)
Creatinine, Ser: 1.36 mg/dL — ABNORMAL HIGH (ref 0.76–1.27)
Glucose: 120 mg/dL — ABNORMAL HIGH (ref 70–99)
Potassium: 4 mmol/L (ref 3.5–5.2)
Sodium: 141 mmol/L (ref 134–144)
eGFR: 59 mL/min/{1.73_m2} — ABNORMAL LOW (ref >59)

## 2021-09-17 LAB — MAGNESIUM: Magnesium: 1.8 mg/dL (ref 1.6–2.3)

## 2021-09-17 LAB — BRAIN NATRIURETIC PEPTIDE: BNP: 153.7 pg/mL — ABNORMAL HIGH (ref 0.0–100.0)

## 2021-09-18 DIAGNOSIS — I4819 Other persistent atrial fibrillation: Secondary | ICD-10-CM | POA: Diagnosis not present

## 2021-09-20 ENCOUNTER — Telehealth: Payer: Self-pay | Admitting: *Deleted

## 2021-09-20 ENCOUNTER — Other Ambulatory Visit: Payer: Self-pay | Admitting: *Deleted

## 2021-09-20 ENCOUNTER — Encounter (HOSPITAL_BASED_OUTPATIENT_CLINIC_OR_DEPARTMENT_OTHER): Payer: Medicare Other | Admitting: Internal Medicine

## 2021-09-20 ENCOUNTER — Ambulatory Visit: Payer: Medicare Other | Attending: Critical Care Medicine | Admitting: Critical Care Medicine

## 2021-09-20 ENCOUNTER — Encounter: Payer: Self-pay | Admitting: Critical Care Medicine

## 2021-09-20 ENCOUNTER — Other Ambulatory Visit: Payer: Self-pay

## 2021-09-20 VITALS — BP 140/82 | HR 86 | Wt 213.8 lb

## 2021-09-20 DIAGNOSIS — N1832 Chronic kidney disease, stage 3b: Secondary | ICD-10-CM | POA: Diagnosis not present

## 2021-09-20 DIAGNOSIS — I87333 Chronic venous hypertension (idiopathic) with ulcer and inflammation of bilateral lower extremity: Secondary | ICD-10-CM | POA: Diagnosis not present

## 2021-09-20 DIAGNOSIS — S81801D Unspecified open wound, right lower leg, subsequent encounter: Secondary | ICD-10-CM | POA: Diagnosis not present

## 2021-09-20 DIAGNOSIS — M1000 Idiopathic gout, unspecified site: Secondary | ICD-10-CM | POA: Diagnosis not present

## 2021-09-20 DIAGNOSIS — I130 Hypertensive heart and chronic kidney disease with heart failure and stage 1 through stage 4 chronic kidney disease, or unspecified chronic kidney disease: Secondary | ICD-10-CM | POA: Diagnosis not present

## 2021-09-20 DIAGNOSIS — I70201 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, right leg: Secondary | ICD-10-CM | POA: Diagnosis not present

## 2021-09-20 DIAGNOSIS — I70299 Other atherosclerosis of native arteries of extremities, unspecified extremity: Secondary | ICD-10-CM | POA: Insufficient documentation

## 2021-09-20 DIAGNOSIS — I70248 Atherosclerosis of native arteries of left leg with ulceration of other part of lower left leg: Secondary | ICD-10-CM | POA: Diagnosis not present

## 2021-09-20 DIAGNOSIS — I890 Lymphedema, not elsewhere classified: Secondary | ICD-10-CM | POA: Diagnosis not present

## 2021-09-20 DIAGNOSIS — E7841 Elevated Lipoprotein(a): Secondary | ICD-10-CM | POA: Diagnosis not present

## 2021-09-20 DIAGNOSIS — I739 Peripheral vascular disease, unspecified: Secondary | ICD-10-CM | POA: Diagnosis not present

## 2021-09-20 DIAGNOSIS — I10 Essential (primary) hypertension: Secondary | ICD-10-CM

## 2021-09-20 DIAGNOSIS — S81802D Unspecified open wound, left lower leg, subsequent encounter: Secondary | ICD-10-CM | POA: Diagnosis not present

## 2021-09-20 DIAGNOSIS — I350 Nonrheumatic aortic (valve) stenosis: Secondary | ICD-10-CM | POA: Diagnosis not present

## 2021-09-20 DIAGNOSIS — I5042 Chronic combined systolic (congestive) and diastolic (congestive) heart failure: Secondary | ICD-10-CM | POA: Diagnosis not present

## 2021-09-20 DIAGNOSIS — L97909 Non-pressure chronic ulcer of unspecified part of unspecified lower leg with unspecified severity: Secondary | ICD-10-CM

## 2021-09-20 DIAGNOSIS — S81801A Unspecified open wound, right lower leg, initial encounter: Secondary | ICD-10-CM | POA: Insufficient documentation

## 2021-09-20 DIAGNOSIS — G4733 Obstructive sleep apnea (adult) (pediatric): Secondary | ICD-10-CM | POA: Diagnosis not present

## 2021-09-20 DIAGNOSIS — L03119 Cellulitis of unspecified part of limb: Secondary | ICD-10-CM | POA: Diagnosis not present

## 2021-09-20 DIAGNOSIS — S81809A Unspecified open wound, unspecified lower leg, initial encounter: Secondary | ICD-10-CM | POA: Insufficient documentation

## 2021-09-20 DIAGNOSIS — L97822 Non-pressure chronic ulcer of other part of left lower leg with fat layer exposed: Secondary | ICD-10-CM | POA: Diagnosis not present

## 2021-09-20 DIAGNOSIS — Z1211 Encounter for screening for malignant neoplasm of colon: Secondary | ICD-10-CM

## 2021-09-20 DIAGNOSIS — N183 Chronic kidney disease, stage 3 unspecified: Secondary | ICD-10-CM | POA: Diagnosis not present

## 2021-09-20 DIAGNOSIS — L97812 Non-pressure chronic ulcer of other part of right lower leg with fat layer exposed: Secondary | ICD-10-CM | POA: Diagnosis not present

## 2021-09-20 MED ORDER — MAGNESIUM OXIDE 400 MG PO TABS: 400.0000 mg | ORAL_TABLET | Freq: Every day | ORAL | 3 refills | Status: DC

## 2021-09-20 MED ORDER — OLMESARTAN MEDOXOMIL 40 MG PO TABS: 40.0000 mg | ORAL_TABLET | Freq: Every day | ORAL | 2 refills | Status: DC

## 2021-09-20 MED ORDER — ALLOPURINOL 100 MG PO TABS: ORAL_TABLET | Freq: Every day | ORAL | 6 refills | Status: DC

## 2021-09-20 MED ORDER — ATORVASTATIN CALCIUM 40 MG PO TABS: 40.0000 mg | ORAL_TABLET | Freq: Every day | ORAL | 2 refills | Status: DC

## 2021-09-20 NOTE — Telephone Encounter (Signed)
Prior Authorization for TITRATION sent to [facility] via web portal. Tracking Number. [id number]   Prior Authorization is not required for the requested services EXPECTED FROM DATE*  10/02/2021 EXPECTED TO DATE*  11/02/2021

## 2021-09-20 NOTE — Assessment & Plan Note (Signed)
Refill allopurinol 

## 2021-09-20 NOTE — Assessment & Plan Note (Signed)
Wounds in the lower extremities but no evidence of infection

## 2021-09-20 NOTE — Assessment & Plan Note (Signed)
Per vascular surgery he has peripheral artery disease in the lower extremities awaiting aortogram in the meantime we will increase atorvastatin dosing to 40 mg daily and continue aspirin 81 mg daily

## 2021-09-20 NOTE — Assessment & Plan Note (Signed)
Continue to monitor was stable at last check June 7

## 2021-09-20 NOTE — Progress Notes (Signed)
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 06/18/2021    4:46 PM 05/27/2021   10:18 AM  CBC  WBC 3.4 - 10.8 x10E3/uL 8.1  4.1    Hemoglobin 13.0 - 17.7 g/dL 11.4  10.9  12.2   Hematocrit 37.5 - 51.0 % 38.6  33.9  36.0   Platelets 150 - 450 x10E3/uL 254  237        Latest Ref Rng & Units 09/16/2021    3:27 PM 08/23/2021    9:30 AM 06/18/2021    4:46 PM  BMP  Glucose 70 - 99 mg/dL 120  99  98   BUN 8 - 27 mg/dL $Remove'26  19  20   'WFIExJf$ Creatinine 0.76 - 1.27 mg/dL 1.36  1.44  1.34   BUN/Creat Ratio 10 - $Re'24 19  13    'kno$ Sodium 134 - 144 mmol/L 141  138  140   Potassium 3.5 - 5.2 mmol/L 4.0  4.7  4.1   Chloride 96 - 106 mmol/L 104  100  106   CO2 20 - 29 mmol/L $RemoveB'21  23  26   'HFByWQxS$ Calcium 8.6 - 10.2 mg/dL 9.3  9.9  9.4       Latest Ref Rng & Units 08/23/2021    9:30 AM 06/18/2021    4:46 PM 03/06/2020    3:51 PM  Hepatic Function  Total Protein 6.0 - 8.5 g/dL 8.8  7.9  7.8   Albumin 3.8 - 4.8 g/dL 4.0  3.3  3.7   AST 0 - 40 IU/L $Remov'18  19  12   'ygtYEc$ ALT 0 - 44 IU/L $Remov'13  16  8   'Vjzikw$ Alk Phosphatase 44 - 121 IU/L 95  74  75   Total Bilirubin 0.0 - 1.2 mg/dL 0.5  0.6  0.5      ECHOCARDIOGRAM REPORT         Patient Name:   [name]    Date of Exam:  03/18/2021  Medical Rec #:  [id number]     Height:       69.0 in  Accession #:    [id number]    Weight:       219.4 lb  Date of Birth:  [birth year]     BSA:          2.149 m  Patient Age:    52 years      BP:           122/88 mmHg  Patient Gender: M  HR:           69 bpm.  Exam Location:  [address]   Procedure: 2D Echo, 3D Echo, Cardiac Doppler and Color Doppler   Indications:    I35.0 Aortic Stenosis     History:        Patient has prior history of Echocardiogram examinations,  most                  recent 10/27/2020. CHF, Aortic Valve Disease,                  Signs/Symptoms:Dyspnea; Risk Factors:Hypertension,  Dyslipidemia                  and Sleep Apnea. Aortic Stenosis (prior Mean gradient  54mmHG).     Sonographer:    Deliah Boston RDCS  Referring Phys: Burnett Harry Mariusz Jubb   IMPRESSIONS     1. Pt appears to be in atrial fibrillation during the study; normal LV  function; severe LVH more prominent in the septum; suggest cardiac MRI to  R/O hypertrophic cardiomyopathy or amyloid; severe AS (peak velocity 4  m/s; mean gradient 33 mmHg; AVA 0.9-1   cm2).   2. Left ventricular ejection fraction, by estimation, is 70 to 75%. The  left ventricle has hyperdynamic function. The left ventricle has no  regional wall motion abnormalities. There is severe asymmetric left  ventricular hypertrophy. Left ventricular  diastolic function could not be evaluated.   3. Right ventricular systolic function is normal. The right ventricular  size is normal. There is moderately elevated pulmonary artery systolic  pressure.   4. Left atrial size was severely dilated.   5. Right atrial size was severely dilated.   6. The mitral valve is normal in structure. Mild mitral valve  regurgitation. No evidence of mitral stenosis.   7. The aortic valve has an indeterminant number of cusps. Aortic valve  regurgitation is mild. Severe aortic valve stenosis.   8. Aortic dilatation noted.  There is borderline dilatation of the aortic  root and of the ascending aorta, measuring 38 mm.   9. The inferior vena cava is normal in size with greater than 50%  respiratory variability, suggesting right atrial pressure of 3 mmHg.   FINDINGS   Left Ventricle: Left ventricular ejection fraction, by estimation, is 70  to 75%. The left ventricle has hyperdynamic function. The left ventricle  has no regional wall motion abnormalities. The left ventricular internal  cavity size was normal in size.  There is severe asymmetric left ventricular hypertrophy. Left ventricular  diastolic function could not be evaluated due to atrial fibrillation. Left  ventricular diastolic function could not be evaluated.   Right Ventricle: The right ventricular size is normal. Right ventricular  systolic function is normal. There is moderately elevated pulmonary artery  systolic pressure. The tricuspid regurgitant velocity is 3.36 m/s, and  with an assumed right atrial  pressure of 3 mmHg, the estimated right ventricular systolic pressure is  79.8 mmHg.   Left Atrium: Left atrial size was severely dilated.   Right Atrium: Right atrial size was severely dilated.   Pericardium: There is no evidence of pericardial effusion.   Mitral Valve: The mitral valve is normal in structure. Mild mitral annular  calcification. Mild mitral valve regurgitation. No evidence of mitral  valve stenosis.   Tricuspid Valve: The tricuspid valve is normal in structure. Tricuspid  valve regurgitation is mild . No evidence of tricuspid stenosis.   Aortic Valve:  The aortic valve has an indeterminant number of cusps.  Aortic valve regurgitation is mild. Aortic regurgitation PHT measures 695  msec. Severe aortic stenosis is present. Aortic valve mean gradient  measures 33.3 mmHg. Aortic valve peak  gradient measures 63.9 mmHg. Aortic valve area, by VTI measures 1.05 cm.   Pulmonic Valve: The pulmonic valve was normal in  structure. Pulmonic valve  regurgitation is not visualized. No evidence of pulmonic stenosis.   Aorta: Aortic dilatation noted. There is borderline dilatation of the  aortic root and of the ascending aorta, measuring 38 mm.   Venous: The inferior vena cava is normal in size with greater than 50%  respiratory variability, suggesting right atrial pressure of 3 mmHg.   IAS/Shunts: No atrial level shunt detected by color flow Doppler.   Additional Comments: Pt appears to be in atrial fibrillation during the  study; normal LV function; severe LVH more prominent in the septum;  suggest cardiac MRI to R/O hypertrophic cardiomyopathy or amyloid; severe  AS (peak velocity 4 m/s; mean gradient  33 mmHg; AVA 0.9-1 cm2).      LEFT VENTRICLE  PLAX 2D  LVIDd:         4.00 cm   Diastology  LVIDs:         2.50 cm   LV e' medial:    9.57 cm/s  LV PW:         1.30 cm   LV E/e' medial:  10.8  LV IVS:        2.50 cm   LV e' lateral:   11.20 cm/s  LVOT diam:     2.10 cm   LV E/e' lateral: 9.2  LV SV:         72  LV SV Index:   33  LVOT Area:     3.46 cm                              3D Volume EF:                           3D EF:        56 %                           LV EDV:       143 ml                           LV ESV:       63 ml                           LV SV:        80 ml   RIGHT VENTRICLE  RV S prime:     13.80 cm/s  TAPSE (M-mode): 2.1 cm   LEFT ATRIUM              Index        RIGHT ATRIUM           Index  LA diam:        5.90 cm  2.75 cm/m   RA Area:     38.30 cm  LA Vol (A2C):   109.0 ml 50.72 ml/m  RA Volume:   156.00 ml 72.59 ml/m  LA Vol (A4C):  148.0 ml 68.87 ml/m  LA Biplane Vol: 134.0 ml 62.36 ml/m   AORTIC VALVE  AV Area (Vmax):    0.95 cm  AV Area (Vmean):   1.03 cm  AV Area (VTI):     1.05 cm  AV Vmax:           399.67 cm/s  AV Vmean:          239.000 cm/s  AV VTI:            0.687 m  AV Peak Grad:      63.9 mmHg  AV Mean Grad:      33.3 mmHg  LVOT Vmax:          109.64 cm/s  LVOT Vmean:        71.180 cm/s  LVOT VTI:          0.207 m  LVOT/AV VTI ratio: 0.30  AI PHT:            695 msec     AORTA  Ao Root diam: 3.80 cm  Ao Asc diam:  3.80 cm   MITRAL VALVE                TRICUSPID VALVE  MV Area (PHT): cm          TR Peak grad:   45.2 mmHg  MV Decel Time: 188 msec     TR Vmax:        336.00 cm/s  MR Peak grad: 161.8 mmHg  MR Mean grad: 102.0 mmHg    SHUNTS  MR Vmax:      636.00 cm/s   Systemic VTI:  0.21 m  MR Vmean:     475.0 cm/s    Systemic Diam: 2.10 cm  MV E velocity: 103.50 cm/s  MV A velocity: 37.60 cm/s  MV E/A ratio:  2.75   Kirk Ruths MD  Electronically signed by Kirk Ruths MD  Signature Date/Time: 03/18/2021/12:33:42 PM         Final     Physicians   Panel Physicians Referring Physician Case Authorizing Physician Burnell Blanks, MD (Primary)       Procedures   RIGHT/LEFT HEART CATH AND CORONARY ANGIOGRAPHY   Conclusion       Ost RCA to Prox RCA lesion is 40% stenosed.   Prox RCA to Mid RCA lesion is 20% stenosed.   RPDA lesion is 30% stenosed.   RPAV lesion is 50% stenosed.   Dist Cx lesion is 50% stenosed.   2nd Diag lesion is 90% stenosed.   Prox LAD to Mid LAD lesion is 30% stenosed.   Dist LAD lesion is 90% stenosed.   1. The LAD is a large caliber vessel that courses to the apex. There is mild non-obstructive disease in the proximal and mid LAD. The apical LAD has diffuse severe stenosis. The first Diagonal is a moderate caliber vessel with mild non-obstructive disease. The second Diagonal branch is a moderate caliber vessel with a focal severe stenosis 2. The Circumflex is a large caliber vessel with moderate distal stenosis 3. The RCA is a large dominant artery with mild eccentric proximal stenosis. The PDA and posterolateral arteries have moderate diffuse disease.  4. Severe aortic stenosis by echo. The aortic valve was not crossed today.    RA 14 RV 42/12/19 PA 44/22  (mean 27) PCWP 19 AO 103/64   Will continue workup for AVR. Given his severe LVH and young age, he may be better treated with  surgical AVR than TAVR. Will review with our valve team and make arrangements for CT scans if felt necessary before being seen by our CT surgeon.    Indications   Severe aortic stenosis [I35.0 (ICD-10-CM)]   Procedural Details   Technical Details Indication: 61 yo male with severe aortic stenosis, workup for TAVR vs surgical AVR  Procedure: The risks, benefits, complications, treatment options, and expected outcomes were discussed with the patient. The patient and/or family concurred with the proposed plan, giving informed consent. The patient was brought to the cath lab after IV hydration was given. The patient was sedated with Versed and Fentanyl. The IV catheter in the right wrist was changed for a 5 French sheath. Right heart catheterization performed with a balloon tipped catheter. The right wrist was prepped and draped in a sterile fashion. 1% lidocaine was used for local anesthesia. Using the modified Seldinger access technique, a 5 French sheath was placed in the right radial artery. 3 mg Verapamil was given through the sheath. Weight based IV heparin was given. Standard diagnostic catheters were used to perform selective coronary angiography. I did not cross the aortic valve. All catheter exchanges were performed over an exchange length guidewire.   The sheath was removed from the right radial artery and a Terumo hemostasis band was applied at the arteriotomy site on the right wrist.      Fluoro time: 3.1 (min) DAP: 20080 (mGycm2) Cumulative Air Kerma: 841 (mGy) Complications   Complications documented before study signed (05/27/2021 66:06 AM)    No complications were associated with this study. Documented by Jamie Kato - 05/27/2021  9:57 AM    Coronary Findings   Diagnostic Dominance: Right Left Anterior Descending Vessel is large. Prox LAD  to Mid LAD lesion is 30% stenosed. Dist LAD lesion is 90% stenosed.  Second Diagonal Branch Vessel is moderate in size. 2nd Diag lesion is 90% stenosed.  Left Circumflex Vessel is large. Dist Cx lesion is 50% stenosed.  Right Coronary Artery Vessel is large. Ost RCA to Prox RCA lesion is 40% stenosed. The lesion is eccentric. Prox RCA to Mid RCA lesion is 20% stenosed.  Right Posterior Descending Artery RPDA lesion is 30% stenosed.  Right Posterior Atrioventricular Artery RPAV lesion is 50% stenosed.  Intervention   No interventions have been documented.   Coronary Diagrams   Diagnostic Dominance: Right Intervention   Implants      No implant documentation for this case.   Syngo Images    Show images for CARDIAC CATHETERIZATION Images on Long Term Storage    Show images for Vishaal, [place]roll[place] to Procedure Log    Procedure Log  Hemo Data   Flowsheet Row Most Recent Value Fick Cardiac Output 7.06 L/min Fick Cardiac Output Index 3.32 (L/min)/BSA RA A Wave 15 mmHg RA V Wave 16 mmHg RA Mean 15 mmHg RV Systolic Pressure 42 mmHg RV Diastolic Pressure 10 mmHg RV EDP 17 mmHg PA Systolic Pressure 42 mmHg PA Diastolic Pressure 22 mmHg PA Mean 31 mmHg PW A Wave 19 mmHg PW V Wave 25 mmHg PW Mean 20 mmHg AO Systolic Pressure 301 mmHg AO Diastolic Pressure 74 mmHg AO Mean 84 mmHg QP/QS 1 TPVR Index 9.34 HRUI TSVR Index 24.09 HRUI PVR SVR Ratio 0.17 TPVR/TSVR Ratio 0.39       Assessment & Plan:  I personally reviewed all images and lab data in the [facility] system as well as any outside material available during this office visit and agree with  the  radiology impressions.   Severe aortic stenosis Patient ultimately will need aortic valve replacement awaiting other medical conditions to stabilize first  Peripheral artery disease ([place]) Per vascular surgery he has peripheral artery disease in the lower extremities awaiting aortogram in the meantime we will  increase atorvastatin dosing to 40 mg daily and continue aspirin 81 mg daily  HTN (hypertension) At goal no change in medicines he is to be on the Benicar 40 mg a day  OSA (obstructive sleep apnea) We will message sleep physician to see where we are getting the CPAP titration performed  Stage 3b chronic kidney disease ([place]) Continue to monitor was stable at last check June 7  Hyperlipemia Need to get to goal of less than 70 LDL increase atorvastatin to 40 mg daily  Gout Refill allopurinol  Cellulitis of lower extremity Wounds in the lower extremities but no evidence of infection  Open wound of lower extremity bilateral Care per wound service   Kendyl was seen today for medication refill.  Diagnoses and all orders for this visit:  Colon cancer screening -     Fecal occult blood, imunochemical  Severe aortic stenosis  Peripheral artery disease (HCC)  Primary hypertension  OSA (obstructive sleep apnea)  Stage 3b chronic kidney disease (HCC)  Elevated lipoprotein(a)  Idiopathic gout, unspecified chronicity, unspecified site  Cellulitis of lower extremity, unspecified laterality  Open wound of both lower extremities, subsequent encounter  Other orders -     allopurinol (ZYLOPRIM) 100 MG tablet; TAKE 1 TABLET (100 MG TOTAL) BY MOUTH DAILY. -     atorvastatin (LIPITOR) 40 MG tablet; Take 1 tablet (40 mg total) by mouth daily. -     olmesartan (BENICAR) 40 MG tablet; Take 1 tablet (40 mg total) by mouth daily.  38 minutes spent assessing patient performing history and physical complex decision making is high

## 2021-09-20 NOTE — Patient Instructions (Signed)
Colon cancer screening kit will be issued to the lab today pick this up on your way out  Increase atorvastatin to 40 mg daily and increase olmesartan to 40 mg daily new prescription sent to your pharmacy  Keep your follow-up appointments with wound care vascular surgery and cardiology  We are checking on the status of your sleep study  Return to see Dr. Joya Gaskins 2 months

## 2021-09-20 NOTE — Assessment & Plan Note (Signed)
Need to get to goal of less than 70 LDL increase atorvastatin to 40 mg daily

## 2021-09-20 NOTE — Assessment & Plan Note (Signed)
Patient ultimately will need aortic valve replacement awaiting other medical conditions to stabilize first

## 2021-09-20 NOTE — Progress Notes (Signed)
IVORY, BAIL ([id number]) Visit Report for 09/20/2021 Arrival Information Details Patient Name: Date of Service: [name], [name] 09/20/2021 2:30 PM Medical Record Number: [id number] Patient Account Number: [id number] Date of Birth/Sex: Treating RN: [birth year] (61 y.o. Lytle Michaels Primary Care Deana Krock: [name] Other Clinician: Referring Tylerjames Hoglund: Treating Kaycee Mcgaugh/Extender: Everardo Beals in Treatment: 3 Visit Information History Since Last Visit Added or deleted any medications: No Patient Arrived: [place] Mor Any new allergies or adverse reactions: No Arrival Time: 15:02 Had a fall or experienced change in No Transfer Assistance: None activities of daily living that may affect Patient Identification Verified: Yes risk of falls: Secondary Verification Process Completed: Yes Signs or symptoms of abuse/neglect since last visito No Patient Requires Transmission-Based Precautions: No Hospitalized since last visit: No Patient Has Alerts: No Implantable device outside of the clinic excluding No cellular tissue based products placed in the center since last visit: Has Dressing in Place as Prescribed: Yes Has Compression in Place as Prescribed: Yes Pain Present Now: No Electronic Signature(s) Signed: 09/20/2021 5:16:31 PM By: Antonieta Iba Entered By: Antonieta Iba on 09/20/2021 15:02:53 -------------------------------------------------------------------------------- Clinic Level of Care Assessment Details Patient Name: Date of Service: [name], [name] 09/20/2021 2:30 PM Medical Record Number: [id number] Patient Account Number: [id number] Date of Birth/Sex: Treating RN: 1960-10-13 (61 y.o. Lytle Michaels Primary Care Nayeli Calvert: [name] Other Clinician: Referring Tarisa Paola: Treating Samia Kukla/Extender: Everardo Beals in Treatment: 3 Clinic Level of Care Assessment Items TOOL 4 Quantity Score X- 1 0 Use when only an EandM is  performed on FOLLOW-UP visit ASSESSMENTS - Nursing Assessment / Reassessment X- 1 10 Reassessment of Co-morbidities (includes updates in patient status) X- 1 5 Reassessment of Adherence to Treatment Plan ASSESSMENTS - Wound and Skin A ssessment / Reassessment []  - 0 Simple Wound Assessment / Reassessment - one wound X- 2 5 Complex Wound Assessment / Reassessment - multiple wounds []  - 0 Dermatologic / Skin Assessment (not related to wound area) ASSESSMENTS - Focused Assessment []  - 0 Circumferential Edema Measurements - multi extremities []  - 0 Nutritional Assessment / Counseling / Intervention []  - 0 Lower Extremity Assessment (monofilament, tuning fork, pulses) []  - 0 Peripheral Arterial Disease Assessment (using hand held doppler) ASSESSMENTS - Ostomy and/or Continence Assessment and Care []  - 0 Incontinence Assessment and Management []  - 0 Ostomy Care Assessment and Management (repouching, etc.) PROCESS - Coordination of Care []  - 0 Simple Patient / Family Education for ongoing care X- 1 20 Complex (extensive) Patient / Family Education for ongoing care []  - 0 Staff obtains , Records, T Results / Process Orders est []  - 0 Staff telephones HHA, Nursing Homes / Clarify orders / etc []  - 0 Routine Transfer to another Facility (non-emergent condition) []  - 0 Routine Hospital Admission (non-emergent condition) []  - 0 New Admissions / / Ordering NPWT Apligraf, etc. , []  - 0 Emergency Hospital Admission (emergent condition) []  - 0 Simple Discharge Coordination []  - 0 Complex (extensive) Discharge Coordination PROCESS - Special Needs []  - 0 Pediatric / Minor Patient Management []  - 0 Isolation Patient Management []  - 0 Hearing / Language / Visual special needs []  - 0 Assessment of Community assistance (transportation, D/C planning, etc.) []  - 0 Additional assistance / Altered mentation []  - 0 Support Surface(s) Assessment  (bed, cushion, seat, etc.) INTERVENTIONS - Wound Cleansing / Measurement []  - 0 Simple Wound Cleansing - one wound X- 2 5 Complex Wound Cleansing - multiple wounds []  - 0 Wound  Imaging (photographs - any number of wounds) []  - 0 Wound Tracing (instead of photographs) []  - 0 Simple Wound Measurement - one wound X- 2 5 Complex Wound Measurement - multiple wounds INTERVENTIONS - Wound Dressings []  - 0 Small Wound Dressing one or multiple wounds []  - 0 Medium Wound Dressing one or multiple wounds X- 2 20 Large Wound Dressing one or multiple wounds []  - 0 Application of Medications - topical []  - 0 Application of Medications - injection INTERVENTIONS - Miscellaneous []  - 0 External ear exam []  - 0 Specimen Collection (cultures, biopsies, blood, body fluids, etc.) []  - 0 Specimen(s) / Culture(s) sent or taken to Lab for analysis []  - 0 Patient Transfer (multiple staff / / Similar devices) []  - 0 Simple Staple / Suture removal (25 or less) []  - 0 Complex Staple / Suture removal (26 or more) []  - 0 Hypo / Hyperglycemic Management (close monitor of Blood Glucose) []  - 0 Ankle / Brachial Index (ABI) - do not check if billed separately X- 1 5 Vital Signs Has the patient been seen at the hospital within the last three years: Yes Total Score: 110 Level Of Care: New/Established - Level 3 Electronic Signature(s) Signed: 09/20/2021 5:16:31 PM By: Entered By: on 09/20/2021 15:47:43 -------------------------------------------------------------------------------- Encounter Discharge Information Details Patient Name: Date of Service: [name], [name] 09/20/2021 2:30 PM Medical Record Number: Patient Account Number: Date of Birth/Sex: Treating RN: 1961/01/29 (61 y.o. Primary Care Geovanni Rahming: Other Clinician: Referring Jose Alleyne: Treating Ashten Sarnowski/Extender: in Treatment: 3 Encounter Discharge Information Items Discharge Condition: Stable Ambulatory Status: Cane Discharge Destination: Home Transportation: Private Auto Schedule Follow-up Appointment: Yes Clinical Summary of Care: Provided on 09/20/2021 Form Type Recipient Paper Patient Patient Electronic Signature(s) Signed: 09/20/2021 5:16:31 PM By: Antonieta Iba Entered By: Antonieta Iba on 09/20/2021 15:47:03 -------------------------------------------------------------------------------- Patient/Caregiver Education Details Patient Name: Date of Service: [name] 6/19/2023andnbsp2:30 PM Medical Record Number: [id number] Patient Account Number: [id number] Date of Birth/Gender: Treating RN: Dec 11, 1960 (61 y.o. Lytle Michaels Primary Care Physician: [name] Other Clinician: Referring Physician: Treating Physician/Extender: Everardo Beals in Treatment: 3 Education Assessment Education Provided To: Patient Education Topics Provided Venous: Methods: Explain/Verbal Responses: State content correctly Wound/Skin Impairment: Methods: Explain/Verbal Responses: State content correctly Electronic Signature(s) Signed: 09/20/2021 5:16:31 PM By: 09/22/2021 Entered By: Antonieta Iba on 09/20/2021 15:46:42 -------------------------------------------------------------------------------- Wound Assessment Details Patient Name: Date of Service: [name], [name] 09/20/2021 2:30 PM Medical Record Number: 09/22/2021 Patient Account Number: [id number] Date of Birth/Sex: Treating RN: Nov 17, 1960 (61 y.o. 76 Primary Care Samba Cumba: Lytle Michaels Other Clinician: Referring Onyekachi Gathright: Treating Catha Ontko/Extender: [name] in Treatment: 3 Wound Status Wound Number: 5 Primary Etiology: Venous Leg Ulcer Wound Location: Right, Circumferential Lower Leg Wound Status: Open Wounding Event: Gradually  Appeared Comorbid History: Congestive Heart Failure, Hypertension Date Acquired: 07/03/2021 Weeks Of Treatment: 3 Clustered Wound: No Wound Measurements Length: (cm) 31 Width: (cm) 38 Depth: (cm) 0.1 Area: (cm) 925.199 Volume: (cm) 92.52 % Reduction in Area: 0% % Reduction in Volume: 0% Epithelialization: Small (1-33%) Tunneling: No Undermining: No Wound Description Classification: Full Thickness With Exposed Support Structures Wound Margin: Distinct, outline attached Exudate Amount: Large Exudate Type: Serosanguineous Exudate Color: red, brown Foul Odor After Cleansing: Yes Due to Product Use: No Slough/Fibrino Yes Wound Bed Granulation Amount: Medium (34-66%) Exposed Structure Granulation Quality: Red, Pink Fascia Exposed: No Necrotic Amount: Medium (  34-66%) Fat Layer (Subcutaneous Tissue) Exposed: Yes Necrotic Quality: Adherent Slough Tendon Exposed: No Muscle Exposed: No Joint Exposed: No Bone Exposed: No Assessment Notes maceration noted Treatment Notes Wound #5 (Lower Leg) Wound Laterality: Right, Circumferential Cleanser Soap and Water Discharge Instruction: May shower and wash wound with dial antibacterial soap and water prior to dressing change. Peri-Wound Care Triamcinolone 15 (g) Discharge Instruction: Use triamcinolone 15 (g) as directed Zinc Oxide Ointment 30g tube Discharge Instruction: Apply Zinc Oxide to periwound with each dressing change Sween Lotion (Moisturizing lotion) Discharge Instruction: Apply moisturizing lotion as directed Topical Primary Dressing KerraCel Ag Gelling Fiber Dressing, 4x5 in (silver alginate) Discharge Instruction: Apply silver alginate to wound bed as instructed Secondary Dressing ABD Pad, 5x9 Discharge Instruction: Apply over primary dressing as directed. Secured With Compression Wrap Kerlix Roll 4.5x3.1 (in/yd) Discharge Instruction: Apply Kerlix and Coban compression as directed. Coban Self-Adherent Wrap 4x5  (in/yd) Discharge Instruction: Apply over Kerlix as directed. Compression Stockings Add-Ons Electronic Signature(s) Signed: 09/20/2021 5:16:31 PM By: Antonieta Iba Entered By: Antonieta Iba on 09/20/2021 15:45:43 -------------------------------------------------------------------------------- Wound Assessment Details Patient Name: Date of Service: [name], [name] 09/20/2021 2:30 PM Medical Record Number: [id number] Patient Account Number: [id number] Date of Birth/Sex: Treating RN: July 19, 1960 (61 y.o. Lytle Michaels Primary Care Kagen Kunath: [name] Other Clinician: Referring Aerielle Stoklosa: Treating Lawonda Pretlow/Extender: Everardo Beals in Treatment: 3 Wound Status Wound Number: 6 Primary Etiology: Venous Leg Ulcer Wound Location: Left, Circumferential Lower Leg Wound Status: Open Wounding Event: Gradually Appeared Comorbid History: Congestive Heart Failure, Hypertension Date Acquired: 07/03/2021 Weeks Of Treatment: 3 Clustered Wound: No Wound Measurements Length: (cm) 31 Width: (cm) 38 Depth: (cm) 0.1 Area: (cm) 925.199 Volume: (cm) 92.52 % Reduction in Area: -18.7% % Reduction in Volume: -18.8% Epithelialization: Small (1-33%) Tunneling: No Undermining: No Wound Description Classification: Full Thickness With Exposed Support Structures Wound Margin: Distinct, outline attached Exudate Amount: Large Exudate Type: Serosanguineous Exudate Color: red, brown Foul Odor After Cleansing: Yes Due to Product Use: No Slough/Fibrino Yes Wound Bed Granulation Amount: Medium (34-66%) Exposed Structure Granulation Quality: Red, Pink Fascia Exposed: No Necrotic Amount: Medium (34-66%) Fat Layer (Subcutaneous Tissue) Exposed: No Necrotic Quality: Adherent Slough Tendon Exposed: No Muscle Exposed: No Joint Exposed: No Bone Exposed: No Assessment Notes maceration noted Treatment Notes Wound #6 (Lower Leg) Wound Laterality: Left,  Circumferential Cleanser Soap and Water Discharge Instruction: May shower and wash wound with dial antibacterial soap and water prior to dressing change. Peri-Wound Care Triamcinolone 15 (g) Discharge Instruction: Use triamcinolone 15 (g) as directed Zinc Oxide Ointment 30g tube Discharge Instruction: Apply Zinc Oxide to periwound with each dressing change Sween Lotion (Moisturizing lotion) Discharge Instruction: Apply moisturizing lotion as directed Topical Primary Dressing KerraCel Ag Gelling Fiber Dressing, 4x5 in (silver alginate) Discharge Instruction: Apply silver alginate to wound bed as instructed Secondary Dressing ABD Pad, 5x9 Discharge Instruction: Apply over primary dressing as directed. Secured With Compression Wrap Kerlix Roll 4.5x3.1 (in/yd) Discharge Instruction: Apply Kerlix and Coban compression as directed. Coban Self-Adherent Wrap 4x5 (in/yd) Discharge Instruction: Apply over Kerlix as directed. Compression Stockings Add-Ons Electronic Signature(s) Signed: 09/20/2021 5:16:31 PM By: Antonieta Iba Entered By: Antonieta Iba on 09/20/2021 15:46:12 -------------------------------------------------------------------------------- Vitals Details Patient Name: Date of Service: [name], [name] 09/20/2021 2:30 PM Medical Record Number: [id number] Patient Account Number: [id number] Date of Birth/Sex: Treating RN: Oct 02, 1960 (61 y.o. Lytle Michaels Primary Care Genevia Bouldin: [name] Other Clinician: Referring Jesse Hirst: Treating Ekta Dancer/Extender: Everardo Beals in Treatment: 3 Vital Signs Time Taken:  15:03 Temperature (F): 98.5 Height (in): 69 Pulse (bpm): 79 Weight (lbs): 215 Respiratory Rate (breaths/min): 20 Body Mass Index (BMI): 31.7 Blood Pressure (mmHg): 166/98 Reference Range: 80 - 120 mg / dl Electronic Signature(s) Signed: 09/20/2021 5:16:31 PM By: Antonieta Iba Entered By: Antonieta Iba on 09/20/2021 15:05:01

## 2021-09-20 NOTE — Progress Notes (Signed)
[name], SHI ([id number]) Visit Report for 09/20/2021 SuperBill Details Patient Name: Date of Service: [name], [name] 09/20/2021 Medical Record Number: [id number] Patient Account Number: [id number] Date of Birth/Sex: Treating RN: [birth year] (61 y.o. Lytle Michaels Primary Care Provider: [name] Other Clinician: Referring Provider: Treating Provider/Extender: Everardo Beals in Treatment: 3 Diagnosis Coding ICD-10 Codes Code Description I89.0 Lymphedema, not elsewhere classified I87.333 Chronic venous hypertension (idiopathic) with ulcer and inflammation of bilateral lower extremity L97.822 Non-pressure chronic ulcer of other part of left lower leg with fat layer exposed L97.812 Non-pressure chronic ulcer of other part of right lower leg with fat layer exposed N18.30 Chronic kidney disease, stage 3 unspecified I50.42 Chronic combined systolic (congestive) and diastolic (congestive) heart failure I10 Essential (primary) hypertension 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

## 2021-09-20 NOTE — Assessment & Plan Note (Signed)
Care per wound service

## 2021-09-20 NOTE — Assessment & Plan Note (Signed)
We will message sleep physician to see where we are getting the CPAP titration performed

## 2021-09-20 NOTE — Assessment & Plan Note (Addendum)
At goal no change in medicines he is to be on the Benicar 40 mg a day

## 2021-09-22 ENCOUNTER — Encounter (HOSPITAL_BASED_OUTPATIENT_CLINIC_OR_DEPARTMENT_OTHER): Payer: Medicare Other | Admitting: Physician Assistant

## 2021-09-22 DIAGNOSIS — I5042 Chronic combined systolic (congestive) and diastolic (congestive) heart failure: Secondary | ICD-10-CM | POA: Diagnosis not present

## 2021-09-22 DIAGNOSIS — I70248 Atherosclerosis of native arteries of left leg with ulceration of other part of lower left leg: Secondary | ICD-10-CM | POA: Diagnosis not present

## 2021-09-22 DIAGNOSIS — I890 Lymphedema, not elsewhere classified: Secondary | ICD-10-CM | POA: Diagnosis not present

## 2021-09-22 DIAGNOSIS — L97822 Non-pressure chronic ulcer of other part of left lower leg with fat layer exposed: Secondary | ICD-10-CM | POA: Diagnosis not present

## 2021-09-22 DIAGNOSIS — I87333 Chronic venous hypertension (idiopathic) with ulcer and inflammation of bilateral lower extremity: Secondary | ICD-10-CM | POA: Diagnosis not present

## 2021-09-22 DIAGNOSIS — I70201 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, right leg: Secondary | ICD-10-CM | POA: Diagnosis not present

## 2021-09-22 DIAGNOSIS — L97812 Non-pressure chronic ulcer of other part of right lower leg with fat layer exposed: Secondary | ICD-10-CM | POA: Diagnosis not present

## 2021-09-22 DIAGNOSIS — I130 Hypertensive heart and chronic kidney disease with heart failure and stage 1 through stage 4 chronic kidney disease, or unspecified chronic kidney disease: Secondary | ICD-10-CM | POA: Diagnosis not present

## 2021-09-22 DIAGNOSIS — N183 Chronic kidney disease, stage 3 unspecified: Secondary | ICD-10-CM | POA: Diagnosis not present

## 2021-09-22 NOTE — Progress Notes (Signed)
[name], [name] ([id number]) Visit Report for 09/22/2021 Chief Complaint Document Details Patient Name: Date of Service: [name] 09/22/2021 10:00 A M Medical Record Number: [id number] Patient Account Number: [id number] Date of Birth/Sex: Treating RN: 1960-12-22 (61 y.o. [name] Primary Care Provider: Shan Small Other Clinician: Referring Provider: Treating Provider/Extender: [name] in Treatment: 4 Information Obtained from: Patient Chief Complaint Bilateral Leg Ulcers Electronic Signature(s) Signed: 09/22/2021 11:08:38 AM By: Lenda Kelp PA-C Entered By: Lenda Kelp on 09/22/2021 11:08:38 -------------------------------------------------------------------------------- HPI Details Patient Name: Date of Service: [name], [name] 09/22/2021 10:00 A M Medical Record Number: [id number] Patient Account Number: [id number] Date of Birth/Sex: Treating RN: [birth year] (61 y.o. [name] Primary Care Provider: Shan Small Other Clinician: Referring Provider: Treating Provider/Extender: [name] in Treatment: 4 History of Present Illness HPI Description: 02/24/2020 on evaluation today patient presents with obvious chronic lymphedema of the bilateral lower extremities. Fortunately there is no signs of active infection at this time which is great news. With that being said the patient does have a recent treatment with doxycycline 100 mg for 10 days which he has completed. He does have a history of chronic kidney disease stage III as documented in his chart. He also has chronic venous insufficiency with lower extremity lymphedema noted. He has congestive heart failure and hypertension. He tells me currently that he was unaware of the kidney disease before he mentioned this today. He also tells me that his primary care provider had told him to quit taking showers based on what was going on with his legs  at this time. Fortunately there is no signs of active infection at this time which is great news. No fevers, chills, nausea, vomiting, or diarrhea. 03/04/2020 on evaluation today patient actually appears to be doing much better in regard to his legs. There is really not much draining a lot of the dry skin is starting to loosen up amount of time to get as much of this off today as possible 03/11/2020 upon evaluation today patient appears to be doing well currently in regard to his legs. He still has a lot of dry skin some which I was able to get off today but overall I think that the biggest thing he needs right now is to be able to wash his legs in the shower to loosen some of this up which I think will then come off much more effectively and quickly without as much of an issue. Fortunately there is no evidence of active infection at this time. No fevers, chills, nausea, vomiting, or diarrhea. Readmission: 06/05/2020 upon evaluation today patient appears to be doing worse in regard to his bilateral lower extremities. Unfortunately he has had some issues here with his leg started to swell again he tells me he was not able to wear the compression stockings that he got from elastic therapy. He states when he called the leg he said that the measurements did not seem right and subsequently just sent him something she thought would work. Either way he tells me they were very tight and very uncomfortable he just was not able to stick with it. Fortunately there is no signs of active infection at this time. No fevers, chills, nausea, vomiting, or diarrhea. 06/12/2020 on evaluation today patient actually appears to be doing well in regard to his legs. He has a lot of dry skin but nothing that appears to be open at this point. He did not  get the Velcro compression wraps but does have the pull up compression stockings which if he wears those can definitely be sufficient here. Readmission: 08-25-2021 upon evaluation  today patient appears to be doing well with regard to his legs all things considered. With that being said I do not see anything that actively looks like it is open or draining but I definitely see spots where he had blistering and obvious signs of drainage. Subsequently he is supposed to be having open heart surgery to replace a valve that sounds like but they are not able to do that simply due to the fact that right now he is having more issues currently with his legs and they will not do anything until that is resolved. Fortunately I do not see any evidence of active infection locally or systemically which is great news. No fevers, chills, nausea, vomiting, or diarrhea. The patient does have a history noted previously of lymphedema, chronic venous hypertension, chronic kidney disease stage III, congestive heart failure, and hypertension. 09-01-2021 upon evaluation today patient actually appears to be healed I do not see anything open at this time which is great news. Fortunately he is doing excellent and I think they were on the right track. With that being said there were a couple areas where he was so dry that the wrap actually stuck to his leg and this has caused some issues here with a couple small openings remaining but I think by next week these will be sealed up. Overall I think that he needs some lotion twice a day and Ace wraps are probably to be the best way to go to help with compression for now. He does have the vascular appointment upcoming in a couple of days. 6/7; patient comes in today with quite a deterioration from last week. Coupled with this is that he had his arterial studies which were really not very good on the right his ABI was 0.92 and on the left 1.09 but TBI was 0.52 on the right and 0.36 on the left he had monophasic waveforms almost diffusely on the right with an estimated 50 to 74% stenosis of the distal SFA on the left he had similar findings. 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 We are working on getting him a vascular evaluation./Consultation Also looking through his chart he has a lot of other medical problems including severe aortic stenosis and he has been referred to cardiac surgery. He has atrial fibrillation on Eliquis 09-15-2021 upon evaluation today patient appears to be doing better than it sounds like he was last week. Fortunately I do not see any evidence of active infection locally or systemically which is great news. No fevers, chills, nausea, vomiting, or diarrhea. He does have an appointment with vascular tomorrow. 09-22-2021 upon evaluation today patient appears to be doing well with regard to his legs all things considered. I did review his note and it appears that he has significant peripheral vascular disease bilaterally in the lower extremities. Subsequently this seems to be even a femoral and iliac arteries. Subsequently I do believe that the arteriogram is probably going to be a good way to go as far as trying to improve his blood flow as noted in the record from Dr. Deitra Mayo on 09-16-2021. Electronic Signature(s) Signed: 09/22/2021 11:09:53 AM By: [name] PA-C Entered By: Joaquim Lai  IIIMargarita Grizzle on 09/22/2021 11:09:53 -------------------------------------------------------------------------------- Physical Exam Details Patient Name: Date of Service: [name], [name] 09/22/2021 10:00 A M Medical Record Number: [id number] Patient Account Number: [id number] Date of Birth/Sex: Treating RN: 06/11/1960 (61 y.o. [name], Small Primary Care Provider: [name] Other Clinician: Referring Provider: Treating Provider/Extender: [name] in Treatment: 4 Constitutional Well-nourished and well-hydrated in no acute  distress. Respiratory normal breathing without difficulty. Psychiatric this patient is able to make decisions and demonstrates good insight into disease process. Alert and Oriented x 3. pleasant and cooperative. Notes Patient's legs are actually doing somewhat better with a significant increase in epithelial tissue and healing there is less weeping although he still has quite a bit circumferentially bilaterally. Overall I do not see any signs of active infection locally or systemically which is great news. Electronic Signature(s) Signed: 09/22/2021 11:10:15 AM By: [name] PA-C Entered By: [name] on 09/22/2021 11:10:15 -------------------------------------------------------------------------------- Physician Orders Details Patient Name: Date of Service: ILYAN, CORIELL 09/22/2021 10:00 [place] Record Number: [id number] Patient Account Number: [id number] Date of Birth/Sex: Treating RN: 1960/12/22 (61 y.o. [name], Small Primary Care Provider: [name] Other Clinician: Referring Provider: Treating Provider/Extender: [name] in Treatment: 4 Verbal / Phone Orders: No Diagnosis Coding Follow-up Appointments ppointment in 1 week. - Wed. 09/29/21 @ 1000 w/ [facility], [place] # 9 Return A Nurse Visit: - Monday 09/27/2021 @ 0815 w/ Tammi Klippel Room # 8 Bathing/ Shower/ Hygiene May shower with protection but do not get wound dressing(s) wet. Edema Control - Lymphedema / SCD / Other Elevate legs to the level of the heart or above for 30 minutes daily and/or when sitting, a frequency of: Avoid standing for long periods of time. Wound Treatment Wound #5 - Lower Leg Wound Laterality: Right, Circumferential Cleanser: Soap and Water 2 x Per Week/7 Days Discharge Instructions: May shower and wash wound with dial antibacterial soap and water prior to dressing change. Peri-Wound Care: Triamcinolone 15 (g) 2 x Per Week/7  Days Discharge Instructions: Use triamcinolone 15 (g) as directed Peri-Wound Care: Zinc Oxide Ointment 30g tube 2 x Per Week/7 Days Discharge Instructions: Apply Zinc Oxide to periwound with each dressing change Peri-Wound Care: Sween Lotion (Moisturizing lotion) 2 x Per Week/7 Days Discharge Instructions: Apply moisturizing lotion as directed Prim Dressing: KerraCel Ag Gelling Fiber Dressing, 4x5 in (silver alginate) 2 x Per Week/7 Days ary Discharge Instructions: Apply silver alginate to wound bed as instructed Secondary Dressing: ABD Pad, 5x9 2 x Per Week/7 Days Discharge Instructions: Apply over primary dressing as directed. Compression Wrap: Kerlix Roll 4.5x3.1 (in/yd) 2 x Per Week/7 Days Discharge Instructions: Apply Kerlix and Coban compression as directed. Compression Wrap: Coban Self-Adherent Wrap 4x5 (in/yd) 2 x Per Week/7 Days Discharge Instructions: Apply over Kerlix as directed. Wound #6 - Lower Leg Wound Laterality: Left, Circumferential Cleanser: Soap and Water Discharge Instructions: May shower and wash wound with dial antibacterial soap and water prior to dressing change. Peri-Wound Care: Triamcinolone 15 (g) Discharge Instructions: Use triamcinolone 15 (g) as directed Peri-Wound Care: Zinc Oxide Ointment 30g tube Discharge Instructions: Apply Zinc Oxide to periwound with each dressing change Peri-Wound Care: Sween Lotion (Moisturizing lotion) Discharge Instructions: Apply moisturizing lotion as directed Prim Dressing: KerraCel Ag Gelling Fiber Dressing, 4x5 in (silver alginate) ary Discharge Instructions: Apply silver alginate to wound bed as instructed Secondary Dressing: ABD Pad, 5x9 Discharge Instructions: Apply over primary dressing as directed. Compression Wrap:  Kerlix Roll 4.5x3.1 (in/yd) Discharge Instructions: Apply Kerlix and Coban compression as directed. Compression Wrap: Coban Self-Adherent Wrap 4x5 (in/yd) Discharge Instructions: Apply over Kerlix as  directed. Electronic Signature(s) Signed: 09/22/2021 4:06:51 PM By: Rhae Hammock RN Signed: 09/22/2021 5:09:34 PM By: [name] PA-C Entered By: Rhae Hammock on 09/22/2021 10:57:59 -------------------------------------------------------------------------------- Problem List Details Patient Name: Date of Service: [name], [name] 09/22/2021 10:00 [place] Record Number: [id number] Patient Account Number: [id number] Date of Birth/Sex: Treating RN: [birth year] (61 y.o. [name], Small Primary Care Provider: [name] Other Clinician: Referring Provider: Treating Provider/Extender: [name] in Treatment: 4 Active Problems ICD-10 Encounter Code Description Active Date MDM Diagnosis I89.0 Lymphedema, not elsewhere classified 08/25/2021 No Yes I87.333 Chronic venous hypertension (idiopathic) with ulcer and inflammation of 08/25/2021 No Yes bilateral lower extremity L97.822 Non-pressure chronic ulcer of other part of left lower leg with fat layer exposed5/24/2023 No Yes L97.812 Non-pressure chronic ulcer of other part of right lower leg with fat layer 08/25/2021 No Yes exposed N18.30 Chronic kidney disease, stage 3 unspecified 08/25/2021 No Yes I50.42 Chronic combined systolic (congestive) and diastolic (congestive) heart failure 08/25/2021 No Yes I10 Essential (primary) hypertension 08/25/2021 No Yes 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, right leg 09/08/2021 No Yes I70.248 Atherosclerosis of native arteries of left leg with ulceration of other part of 09/08/2021 No Yes lower leg Inactive Problems Resolved Problems Electronic Signature(s) Signed: 09/22/2021 10:37:20 AM By: [name] PA-C Entered By: [name] on 09/22/2021 10:37:20 -------------------------------------------------------------------------------- Progress Note Details Patient Name: Date of Service: [name], [name] 09/22/2021 10:00 A  M Medical Record Number: [id number] Patient Account Number: [id number] Date of Birth/Sex: Treating RN: September 05, 1960 (61 y.o. [name], Small Primary Care Provider: [name] Other Clinician: Referring Provider: Treating Provider/Extender: [name] in Treatment: 4 Subjective Chief Complaint Information obtained from Patient Bilateral Leg Ulcers History of Present Illness (HPI) 02/24/2020 on evaluation today patient presents with obvious chronic lymphedema of the bilateral lower extremities. Fortunately there is no signs of active infection at this time which is great news. With that being said the patient does have a recent treatment with doxycycline 100 mg for 10 days which he has completed. He does have a history of chronic kidney disease stage III as documented in his chart. He also has chronic venous insufficiency with lower extremity lymphedema noted. He has congestive heart failure and hypertension. He tells me currently that he was unaware of the kidney disease before he mentioned this today. He also tells me that his primary care provider had told him to quit taking showers based on what was going on with his legs at this time. Fortunately there is no signs of active infection at this time which is great news. No fevers, chills, nausea, vomiting, or diarrhea. 03/04/2020 on evaluation today patient actually appears to be doing much better in regard to his legs. There is really not much draining a lot of the dry skin is starting to loosen up amount of time to get as much of this off today as possible 03/11/2020 upon evaluation today patient appears to be doing well currently in regard to his legs. He still has a lot of dry skin some which I was able to get off today but overall I think that the biggest thing he needs right now is to be able to wash his legs in the shower to loosen some of this up which I think will  then come off much more effectively and  quickly without as much of an issue. Fortunately there is no evidence of active infection at this time. No fevers, chills, nausea, vomiting, or diarrhea. Readmission: 06/05/2020 upon evaluation today patient appears to be doing worse in regard to his bilateral lower extremities. Unfortunately he has had some issues here with his leg started to swell again he tells me he was not able to wear the compression stockings that he got from elastic therapy. He states when he called the leg he said that the measurements did not seem right and subsequently just sent him something she thought would work. Either way he tells me they were very tight and very uncomfortable he just was not able to stick with it. Fortunately there is no signs of active infection at this time. No fevers, chills, nausea, vomiting, or diarrhea. 06/12/2020 on evaluation today patient actually appears to be doing well in regard to his legs. He has a lot of dry skin but nothing that appears to be open at this point. He did not get the Velcro compression wraps but does have the pull up compression stockings which if he wears those can definitely be sufficient here. Readmission: 08-25-2021 upon evaluation today patient appears to be doing well with regard to his legs all things considered. With that being said I do not see anything that actively looks like it is open or draining but I definitely see spots where he had blistering and obvious signs of drainage. Subsequently he is supposed to be having open heart surgery to replace a valve that sounds like but they are not able to do that simply due to the fact that right now he is having more issues currently with his legs and they will not do anything until that is resolved. Fortunately I do not see any evidence of active infection locally or systemically which is great news. No fevers, chills, nausea, vomiting, or diarrhea. The patient does have a history noted previously of lymphedema, chronic  venous hypertension, chronic kidney disease stage III, congestive heart failure, and hypertension. 09-01-2021 upon evaluation today patient actually appears to be healed I do not see anything open at this time which is great news. Fortunately he is doing excellent and I think they were on the right track. With that being said there were a couple areas where he was so dry that the wrap actually stuck to his leg and this has caused some issues here with a couple small openings remaining but I think by next week these will be sealed up. Overall I think that he needs some lotion twice a day and Ace wraps are probably to be the best way to go to help with compression for now. He does have the vascular appointment upcoming in a couple of days. 6/7; patient comes in today with quite a deterioration from last week. Coupled with this is that he had his arterial studies which were really not very good on the right his ABI was 0.92 and on the left 1.09 but TBI was 0.52 on the right and 0.36 on the left he had monophasic waveforms almost diffusely on the right with an estimated 50 to 74% stenosis of the distal SFA on the left he had similar findings. 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 We are working on getting him a  vascular evaluation./Consultation Also looking through his chart he has a lot of other medical problems including severe aortic stenosis and he has been referred to cardiac surgery. He has atrial fibrillation on Eliquis 09-15-2021 upon evaluation today patient appears to be doing better than it sounds like he was last week. Fortunately I do not see any evidence of active infection locally or systemically which is great news. No fevers, chills, nausea, vomiting, or diarrhea. He does have an appointment with vascular tomorrow. 09-22-2021 upon  evaluation today patient appears to be doing well with regard to his legs all things considered. I did review his note and it appears that he has significant peripheral vascular disease bilaterally in the lower extremities. Subsequently this seems to be even a femoral and iliac arteries. Subsequently I do believe that the arteriogram is probably going to be a good way to go as far as trying to improve his blood flow as noted in the record from Dr. Deitra Mayo on 09-16-2021. Objective Constitutional Well-nourished and well-hydrated in no acute distress. Vitals Time Taken: 10:03 AM, Height: 69 in, Weight: 215 lbs, BMI: 31.7, Temperature: 97.9 F, Pulse: 89 bpm, Respiratory Rate: 19 breaths/min, Blood Pressure: 168/102 mmHg. Respiratory normal breathing without difficulty. Psychiatric this patient is able to make decisions and demonstrates good insight into disease process. Alert and Oriented x 3. pleasant and cooperative. General Notes: Patient's legs are actually doing somewhat better with a significant increase in epithelial tissue and healing there is less weeping although he still has quite a bit circumferentially bilaterally. Overall I do not see any signs of active infection locally or systemically which is great news. Integumentary (Hair, Skin) Wound #5 status is Open. Original cause of wound was Gradually Appeared. The date acquired was: 07/03/2021. The wound has been in treatment 4 weeks. The wound is located on the Right,Circumferential Lower Leg. The wound measures 36cm length x 36cm width x 0.1cm depth; 1017.876cm^2 area and 101.788cm^3 volume. There is Fat Layer (Subcutaneous Tissue) exposed. There is no tunneling or undermining noted. There is a large amount of serosanguineous drainage noted. Foul odor after cleansing was noted. The wound margin is distinct with the outline attached to the wound base. There is medium (34-66%) red, pink granulation within the wound bed. There is  a medium (34-66%) amount of necrotic tissue within the wound bed including Adherent Slough. Wound #6 status is Open. Original cause of wound was Gradually Appeared. The date acquired was: 07/03/2021. The wound has been in treatment 4 weeks. The wound is located on the Left,Circumferential Lower Leg. The wound measures 32.5cm length x 37cm width x 0.1cm depth; 944.441cm^2 area and 94.444cm^3 volume. There is no tunneling or undermining noted. There is a large amount of serosanguineous drainage noted. Foul odor after cleansing was noted. The wound margin is distinct with the outline attached to the wound base. There is medium (34-66%) red, pink granulation within the wound bed. There is a medium (34-66%) amount of necrotic tissue within the wound bed including Adherent Slough. 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 - Wed. 09/29/21 @ 1000 w/ [facility], [place] # 9 Nurse Visit: - Monday 09/27/2021 @ 0815 w/ Tammi Klippel Room # 8 Bathing/ Shower/ Hygiene: May shower with protection but do not get wound dressing(s) wet. Edema Control - Lymphedema / SCD / Other: Elevate legs to the level of the heart or above for 30 minutes daily and/or when sitting, a frequency of: Avoid standing for long periods of time. WOUND #5: - Lower Leg  Wound Laterality: Right, Circumferential Cleanser: Soap and Water 2 x Per Week/7 Days Discharge Instructions: May shower and wash wound with dial antibacterial soap and water prior to dressing change. Peri-Wound Care: Triamcinolone 15 (g) 2 x Per Week/7 Days Discharge Instructions: Use triamcinolone 15 (g) as directed Peri-Wound Care: Zinc Oxide Ointment 30g tube 2 x Per Week/7 Days Discharge Instructions: Apply Zinc Oxide to periwound with each dressing change Peri-Wound Care: Sween Lotion (Moisturizing lotion) 2 x Per Week/7 Days Discharge Instructions: Apply moisturizing lotion as directed Prim Dressing: KerraCel Ag Gelling Fiber Dressing, 4x5 in (silver alginate) 2 x Per Week/7 Days ary Discharge Instructions: Apply silver alginate to wound bed as instructed Secondary Dressing: ABD Pad, 5x9 2 x Per Week/7 Days Discharge Instructions: Apply over primary dressing as directed. Com pression Wrap: Kerlix Roll 4.5x3.1 (in/yd) 2 x Per Week/7 Days Discharge Instructions: Apply Kerlix and Coban compression as directed. Com pression Wrap: Coban Self-Adherent Wrap 4x5 (in/yd) 2 x Per Week/7 Days Discharge Instructions: Apply over Kerlix as directed. WOUND #6: - Lower Leg Wound Laterality: Left, Circumferential Cleanser: Soap and Water Discharge Instructions: May shower and wash wound with dial antibacterial soap and water prior to dressing change. Peri-Wound Care: Triamcinolone 15 (g) Discharge Instructions: Use triamcinolone 15 (g) as directed Peri-Wound Care: Zinc Oxide Ointment 30g tube Discharge Instructions: Apply Zinc Oxide to periwound with each dressing change Peri-Wound Care: Sween Lotion (Moisturizing lotion) Discharge Instructions: Apply moisturizing lotion as directed Prim Dressing: KerraCel Ag Gelling Fiber Dressing, 4x5 in (silver alginate) ary Discharge Instructions: Apply silver alginate to wound bed as instructed Secondary Dressing: ABD Pad, 5x9 Discharge Instructions: Apply  over primary dressing as directed. Com pression Wrap: Kerlix Roll 4.5x3.1 (in/yd) Discharge Instructions: Apply Kerlix and Coban compression as directed. Com pression Wrap: Coban Self-Adherent Wrap 4x5 (in/yd) Discharge Instructions: Apply over Kerlix as directed. 1. I would recommend that we go ahead and continue with the wound care measures as before and the patient is in agreement with plan this includes the use of the triamcinolone to the leg followed by silver alginate to any open/draining areas. 2. I am also can recommend that we have the patient continue with the Curlex and Coban wrap which she is tolerating without complication. 3. I am also can recommend that the patient continue to monitor for any signs of worsening or infection obviously if anything changes he should let me know. We will see patient back for reevaluation in 1 week here in the clinic. If anything worsens or changes patient will contact our office for additional recommendations. Electronic Signature(s) Signed: 09/22/2021 11:10:57 AM By: [name] PA-C Entered By: [name] on 09/22/2021 11:10:57 -------------------------------------------------------------------------------- SuperBill Details Patient Name: Date of Service: [name], [name] 09/22/2021 Medical Record Number: [id number] Patient Account Number: [id number] Date of Birth/Sex: Treating RN: 1960/06/16 (61 y.o. 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 L97.812 Non-pressure chronic ulcer of other part of right lower leg with fat  layer exposed N18.30 Chronic kidney disease, stage 3 unspecified I50.42 Chronic combined systolic (congestive) and diastolic (congestive) heart failure I10 Essential (primary) hypertension 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

## 2021-09-27 ENCOUNTER — Encounter (HOSPITAL_BASED_OUTPATIENT_CLINIC_OR_DEPARTMENT_OTHER): Payer: Medicare Other | Admitting: Internal Medicine

## 2021-09-27 DIAGNOSIS — I70201 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, right leg: Secondary | ICD-10-CM | POA: Diagnosis not present

## 2021-09-27 DIAGNOSIS — L97822 Non-pressure chronic ulcer of other part of left lower leg with fat layer exposed: Secondary | ICD-10-CM | POA: Diagnosis not present

## 2021-09-27 DIAGNOSIS — N183 Chronic kidney disease, stage 3 unspecified: Secondary | ICD-10-CM | POA: Diagnosis not present

## 2021-09-27 DIAGNOSIS — I70248 Atherosclerosis of native arteries of left leg with ulceration of other part of lower left leg: Secondary | ICD-10-CM | POA: Diagnosis not present

## 2021-09-27 DIAGNOSIS — I5042 Chronic combined systolic (congestive) and diastolic (congestive) heart failure: Secondary | ICD-10-CM | POA: Diagnosis not present

## 2021-09-27 DIAGNOSIS — I4819 Other persistent atrial fibrillation: Secondary | ICD-10-CM | POA: Diagnosis not present

## 2021-09-27 DIAGNOSIS — L97812 Non-pressure chronic ulcer of other part of right lower leg with fat layer exposed: Secondary | ICD-10-CM | POA: Diagnosis not present

## 2021-09-27 DIAGNOSIS — I890 Lymphedema, not elsewhere classified: Secondary | ICD-10-CM | POA: Diagnosis not present

## 2021-09-27 DIAGNOSIS — I87333 Chronic venous hypertension (idiopathic) with ulcer and inflammation of bilateral lower extremity: Secondary | ICD-10-CM | POA: Diagnosis not present

## 2021-09-27 DIAGNOSIS — I130 Hypertensive heart and chronic kidney disease with heart failure and stage 1 through stage 4 chronic kidney disease, or unspecified chronic kidney disease: Secondary | ICD-10-CM | POA: Diagnosis not present

## 2021-09-29 ENCOUNTER — Encounter (HOSPITAL_BASED_OUTPATIENT_CLINIC_OR_DEPARTMENT_OTHER): Payer: Medicare Other | Admitting: Physician Assistant

## 2021-09-29 DIAGNOSIS — I70201 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, right leg: Secondary | ICD-10-CM | POA: Diagnosis not present

## 2021-09-29 DIAGNOSIS — L97812 Non-pressure chronic ulcer of other part of right lower leg with fat layer exposed: Secondary | ICD-10-CM | POA: Diagnosis not present

## 2021-09-29 DIAGNOSIS — I5042 Chronic combined systolic (congestive) and diastolic (congestive) heart failure: Secondary | ICD-10-CM | POA: Diagnosis not present

## 2021-09-29 DIAGNOSIS — L97822 Non-pressure chronic ulcer of other part of left lower leg with fat layer exposed: Secondary | ICD-10-CM | POA: Diagnosis not present

## 2021-09-29 DIAGNOSIS — N183 Chronic kidney disease, stage 3 unspecified: Secondary | ICD-10-CM | POA: Diagnosis not present

## 2021-09-29 DIAGNOSIS — I130 Hypertensive heart and chronic kidney disease with heart failure and stage 1 through stage 4 chronic kidney disease, or unspecified chronic kidney disease: Secondary | ICD-10-CM | POA: Diagnosis not present

## 2021-09-29 DIAGNOSIS — I70248 Atherosclerosis of native arteries of left leg with ulceration of other part of lower left leg: Secondary | ICD-10-CM | POA: Diagnosis not present

## 2021-09-29 DIAGNOSIS — I87333 Chronic venous hypertension (idiopathic) with ulcer and inflammation of bilateral lower extremity: Secondary | ICD-10-CM | POA: Diagnosis not present

## 2021-09-29 DIAGNOSIS — I890 Lymphedema, not elsewhere classified: Secondary | ICD-10-CM | POA: Diagnosis not present

## 2021-09-29 NOTE — Progress Notes (Signed)
[name], [name] ([id number]) Visit Report for 09/29/2021 Chief Complaint Document Details Patient Name: Date of Service: [name], [name] 09/29/2021 10:00 A M Medical Record Number: [id number] Patient Account Number: [id number] Date of Birth/Sex: Treating RN: 11-Sep-1960 (61 y.o. [name], [name] Primary Care Provider: Shan Small Other Clinician: Referring Provider: Treating Provider/Extender: [name] in Treatment: 5 Information Obtained from: Patient Chief Complaint Bilateral Leg Ulcers Electronic Signature(s) Signed: 09/29/2021 9:24:50 AM By: Lenda Kelp PA-C Entered By: Lenda Kelp on 09/29/2021 09:24:50 -------------------------------------------------------------------------------- Problem List Details Patient Name: Date of Service: [name], [name] 09/29/2021 10:00 A M Medical Record Number: [id number] Patient Account Number: [id number] Date of Birth/Sex: Treating RN: 01-20-1961 (62 y.o. [name], [name] Primary Care Provider: Shan Small Other Clinician: Referring Provider: Treating Provider/Extender: [name] in Treatment: 5 Active Problems ICD-10 Encounter Code Description Active Date MDM Diagnosis I89.0 Lymphedema, not elsewhere classified 08/25/2021 No Yes I87.333 Chronic venous hypertension (idiopathic) with ulcer and inflammation of 08/25/2021 No Yes bilateral lower extremity L97.822 Non-pressure chronic ulcer of other part of left lower leg with fat layer exposed5/24/2023 No Yes L97.812 Non-pressure chronic ulcer of other part of right lower leg with fat layer 08/25/2021 No Yes exposed N18.30 Chronic kidney disease, stage 3 unspecified 08/25/2021 No Yes I50.42 Chronic combined systolic (congestive) and diastolic (congestive) heart failure 08/25/2021 No Yes I10 Essential (primary) hypertension 08/25/2021 No Yes 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

## 2021-09-30 ENCOUNTER — Ambulatory Visit (INDEPENDENT_AMBULATORY_CARE_PROVIDER_SITE_OTHER): Payer: Medicare Other | Admitting: *Deleted

## 2021-09-30 DIAGNOSIS — Z0000 Encounter for general adult medical examination without abnormal findings: Secondary | ICD-10-CM

## 2021-09-30 MED ORDER — OMEPRAZOLE 40 MG PO CPDR: 40.0000 mg | DELAYED_RELEASE_CAPSULE | Freq: Every day | ORAL | 3 refills | Status: DC

## 2021-09-30 MED ORDER — ALLOPURINOL 100 MG PO TABS: ORAL_TABLET | Freq: Every day | ORAL | 6 refills | Status: DC

## 2021-09-30 MED ORDER — GABAPENTIN 400 MG PO CAPS: 800.0000 mg | ORAL_CAPSULE | Freq: Three times a day (TID) | ORAL | 2 refills | Status: DC
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
## 2021-10-01 ENCOUNTER — Telehealth: Payer: Self-pay

## 2021-10-01 ENCOUNTER — Other Ambulatory Visit: Payer: Self-pay

## 2021-10-01 ENCOUNTER — Encounter (HOSPITAL_COMMUNITY)
Admission: RE | Disposition: A | Discharge status: Home / Self Care | Payer: Self-pay | Source: Home / Self Care | Attending: Vascular Surgery

## 2021-10-01 ENCOUNTER — Ambulatory Visit (HOSPITAL_COMMUNITY)
Admission: RE | Admit: 2021-10-01 | Discharge: 2021-10-01 | Disposition: A | Discharge status: Home / Self Care | Payer: Medicare Other | Attending: Vascular Surgery | Admitting: Vascular Surgery

## 2021-10-01 DIAGNOSIS — Z539 Procedure and treatment not carried out, unspecified reason: Secondary | ICD-10-CM | POA: Diagnosis not present

## 2021-10-01 DIAGNOSIS — I70299 Other atherosclerosis of native arteries of extremities, unspecified extremity: Secondary | ICD-10-CM

## 2021-10-01 LAB — POCT I-STAT, CHEM 8
BUN: 31 mg/dL — ABNORMAL HIGH (ref 8–23)
Calcium, Ion: 1.25 mmol/L (ref 1.15–1.40)
Chloride: 105 mmol/L (ref 98–111)
Creatinine, Ser: 1.9 mg/dL — ABNORMAL HIGH (ref 0.61–1.24)
Glucose, Bld: 108 mg/dL — ABNORMAL HIGH (ref 70–99)
HCT: 35 % — ABNORMAL LOW (ref 39.0–52.0)
Hemoglobin: 11.9 g/dL — ABNORMAL LOW (ref 13.0–17.0)
Potassium: 3.8 mmol/L (ref 3.5–5.1)
Sodium: 141 mmol/L (ref 135–145)
TCO2: 25 mmol/L (ref 22–32)

## 2021-10-01 SURGERY — ABDOMINAL AORTOGRAM W/LOWER EXTREMITY
Anesthesia: LOCAL

## 2021-10-01 MED ORDER — SODIUM CHLORIDE 0.9 % IV SOLN: INTRAVENOUS | Status: DC

## 2021-10-01 NOTE — Telephone Encounter (Signed)
Received a call from patient who was to have an aortogram with Dr. Edilia Bo on today, stated his procedure was cancelled because he wasn't given information to stop Eliquis. Reminded patient that we spoke on June 19 and I reviewed instructions with him verbally and I sent him a letter with instructions via MyChart as he also requested. He denied seeing the letter. Rescheduled patient for July 21. Reviewed instructions- Stop Eliquis on July 18. Patient verbalized understanding. Will mail patient instructions letter.

## 2021-10-01 NOTE — Progress Notes (Signed)
Patient reports that he did not receive any instructions from Dr. Adele Dan office. Took Eliquis 09/30/21 at 1800. Procedure for today 10/01/21 cancelled due to this. Patient verbalized understanding. Instructions given to hold Eliquis 2 days prior to procedure but resume until resheduled. Patient verbalized understanding.

## 2021-10-06 ENCOUNTER — Encounter (HOSPITAL_BASED_OUTPATIENT_CLINIC_OR_DEPARTMENT_OTHER): Payer: Medicare Other | Attending: Physician Assistant | Admitting: Physician Assistant

## 2021-10-06 DIAGNOSIS — L97812 Non-pressure chronic ulcer of other part of right lower leg with fat layer exposed: Secondary | ICD-10-CM | POA: Insufficient documentation

## 2021-10-06 DIAGNOSIS — I130 Hypertensive heart and chronic kidney disease with heart failure and stage 1 through stage 4 chronic kidney disease, or unspecified chronic kidney disease: Secondary | ICD-10-CM | POA: Insufficient documentation

## 2021-10-06 DIAGNOSIS — N183 Chronic kidney disease, stage 3 unspecified: Secondary | ICD-10-CM | POA: Diagnosis not present

## 2021-10-06 DIAGNOSIS — L97822 Non-pressure chronic ulcer of other part of left lower leg with fat layer exposed: Secondary | ICD-10-CM | POA: Insufficient documentation

## 2021-10-06 DIAGNOSIS — Z1211 Encounter for screening for malignant neoplasm of colon: Secondary | ICD-10-CM | POA: Diagnosis not present

## 2021-10-06 DIAGNOSIS — I87333 Chronic venous hypertension (idiopathic) with ulcer and inflammation of bilateral lower extremity: Secondary | ICD-10-CM | POA: Insufficient documentation

## 2021-10-06 DIAGNOSIS — I890 Lymphedema, not elsewhere classified: Secondary | ICD-10-CM | POA: Diagnosis not present

## 2021-10-06 DIAGNOSIS — I872 Venous insufficiency (chronic) (peripheral): Secondary | ICD-10-CM | POA: Diagnosis not present

## 2021-10-06 DIAGNOSIS — I350 Nonrheumatic aortic (valve) stenosis: Secondary | ICD-10-CM | POA: Insufficient documentation

## 2021-10-06 DIAGNOSIS — I70248 Atherosclerosis of native arteries of left leg with ulceration of other part of lower left leg: Secondary | ICD-10-CM | POA: Diagnosis not present

## 2021-10-06 DIAGNOSIS — I5042 Chronic combined systolic (congestive) and diastolic (congestive) heart failure: Secondary | ICD-10-CM | POA: Insufficient documentation

## 2021-10-06 NOTE — Progress Notes (Addendum)
[name], [name] ([id number]) Visit Report for 10/06/2021 Chief Complaint Document Details Patient Name: Date of Service: RIC, CRAFTS 10/06/2021 10:00 Ruth Number: [id number] Patient Account Number: [id number] Date of Birth/Sex: Treating RN: October 06, 1960 (61 y.o. [name], [name] Primary Care Provider: [name] Other Clinician: Referring Provider: Treating Provider/Extender: [name] in Treatment: 6 Information Obtained from: Patient Chief Complaint Bilateral Leg Ulcers Electronic Signature(s) Signed: 10/06/2021 10:07:03 AM By: [name] PA-C Entered By: [name] on 10/06/2021 10:07:03 -------------------------------------------------------------------------------- HPI Details Patient Name: Date of Service: [name], AERNI 10/06/2021 10:00 McDonald Number: [id number] Patient Account Number: [id number] Date of Birth/Sex: Treating RN: 09-01-1960 (61 y.o. [name], [name] Primary Care Provider: [name] Other Clinician: Referring Provider: Treating Provider/Extender: [name] in Treatment: 6 History of Present Illness HPI Description: 02/24/2020 on evaluation today patient presents with obvious chronic lymphedema of the bilateral lower extremities. Fortunately there is no signs of active infection at this time which is great news. With that being said the patient does have a recent treatment with doxycycline 100 mg for 10 days which he has completed. He does have a history of chronic kidney disease stage III as documented in his chart. He also has chronic venous insufficiency with lower extremity lymphedema noted. He has congestive heart failure and hypertension. He tells me currently that he was unaware of the kidney disease before he mentioned this today. He also tells me that his primary care provider had told him to quit taking showers based on what was going on with his legs at this  time. Fortunately there is no signs of active infection at this time which is great news. No fevers, chills, nausea, vomiting, or diarrhea. 03/04/2020 on evaluation today patient actually appears to be doing much better in regard to his legs. There is really not much draining a lot of the dry skin is starting to loosen up amount of time to get as much of this off today as possible 03/11/2020 upon evaluation today patient appears to be doing well currently in regard to his legs. He still has a lot of dry skin some which I was able to get off today but overall I think that the biggest thing he needs right now is to be able to wash his legs in the shower to loosen some of this up which I think will then come off much more effectively and quickly without as much of an issue. Fortunately there is no evidence of active infection at this time. No fevers, chills, nausea, vomiting, or diarrhea. Readmission: 06/05/2020 upon evaluation today patient appears to be doing worse in regard to his bilateral lower extremities. Unfortunately he has had some issues here with his leg started to swell again he tells me he was not able to wear the compression stockings that he got from elastic therapy. He states when he called the leg he said that the measurements did not seem right and subsequently just sent him something she thought would work. Either way he tells me they were very tight and very uncomfortable he just was not able to stick with it. Fortunately there is no signs of active infection at this time. No fevers, chills, nausea, vomiting, or diarrhea. 06/12/2020 on evaluation today patient actually appears to be doing well in regard to his legs. He has a lot of dry skin but nothing that appears to be open at this point. He did not  get the Velcro compression wraps but does have the pull up compression stockings which if he wears those can definitely be sufficient here. Readmission: 08-25-2021 upon evaluation today  patient appears to be doing well with regard to his legs all things considered. With that being said I do not see anything that actively looks like it is open or draining but I definitely see spots where he had blistering and obvious signs of drainage. Subsequently he is supposed to be having open heart surgery to replace a valve that sounds like but they are not able to do that simply due to the fact that right now he is having more issues currently with his legs and they will not do anything until that is resolved. Fortunately I do not see any evidence of active infection locally or systemically which is great news. No fevers, chills, nausea, vomiting, or diarrhea. The patient does have a history noted previously of lymphedema, chronic venous hypertension, chronic kidney disease stage III, congestive heart failure, and hypertension. 09-01-2021 upon evaluation today patient actually appears to be healed I do not see anything open at this time which is great news. Fortunately he is doing excellent and I think they were on the right track. With that being said there were a couple areas where he was so dry that the wrap actually stuck to his leg and this has caused some issues here with a couple small openings remaining but I think by next week these will be sealed up. Overall I think that he needs some lotion twice a day and Ace wraps are probably to be the best way to go to help with compression for now. He does have the vascular appointment upcoming in a couple of days. 6/7; patient comes in today with quite a deterioration from last week. Coupled with this is that he had his arterial studies which were really not very good on the right his ABI was 0.92 and on the left 1.09 but TBI was 0.52 on the right and 0.36 on the left he had monophasic waveforms almost diffusely on the right with an estimated 50 to 74% stenosis of the distal SFA on the left he had similar findings. 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 We are working on getting him a vascular evaluation./Consultation Also looking through his chart he has a lot of other medical problems including severe aortic stenosis and he has been referred to cardiac surgery. He has atrial fibrillation on Eliquis 09-15-2021 upon evaluation today patient appears to be doing better than it sounds like he was last week. Fortunately I do not see any evidence of active infection locally or systemically which is great news. No fevers, chills, nausea, vomiting, or diarrhea. He does have an appointment with vascular tomorrow. 09-22-2021 upon evaluation today patient appears to be doing well with regard to his legs all things considered. I did review his note and it appears that he has significant peripheral vascular disease bilaterally in the lower extremities. Subsequently this seems to be even a femoral and iliac arteries. Subsequently I do believe that the arteriogram is probably going to be a good way to go as far as trying to improve his blood flow as noted in the record from Dr. Deitra Mayo on 09-16-2021. 09-29-2021 upon evaluation today patient appears to be doing decently well today in regard  to his wounds on the legs. Things are actually showing signs of good improvement and I am very pleased in that regard. I do not see any signs of infection at this time which is great news and in fact everything is pretty dry today. He does have his arteriogram with Dr. Durwin Nora on Friday. With that being said once that is complete I really think things will be significantly improved and hopefully he should be able to not only feel better but also continue with his own compression socks. 10-06-2021 upon evaluation today patient appears to be doing well with regard to his legs in fact there is very  little open at this point. Fortunately I do not see any signs of active infection which is great news. No fevers, chills, nausea, vomiting, or diarrhea. Electronic Signature(s) Signed: 10/06/2021 11:28:42 AM By: Lenda Kelp PA-C Entered By: Lenda Kelp on 10/06/2021 11:28:41 -------------------------------------------------------------------------------- Physical Exam Details Patient Name: Date of Service: [name], LAGE 10/06/2021 10:00 A M Medical Record Number: [id number] Patient Account Number: [id number] Date of Birth/Sex: Treating RN: [birth year] (61 y.o. [name], [name] Primary Care Provider: [name] Other Clinician: Referring Provider: Treating Provider/Extender: [name] in Treatment: 6 Constitutional Well-nourished and well-hydrated in no acute distress. Respiratory normal breathing without difficulty. Psychiatric this patient is able to make decisions and demonstrates good insight into disease process. Alert and Oriented x 3. pleasant and cooperative. Notes Patient's wound bed actually showed signs of good granulation and epithelization at this point. Fortunately I do not see any evidence of active infection at this time which is great news and overall I am extremely pleased with where we are at this point. Unfortunately he was not able to get his procedure done last week which should have taken place due to the fact that they had not told him to stop his blood thinners. Is now scheduled for July 21. Electronic Signature(s) Signed: 10/06/2021 11:29:06 AM By: Lenda Kelp PA-C Entered By: Lenda Kelp on 10/06/2021 11:29:06 -------------------------------------------------------------------------------- Physician Orders Details Patient Name: Date of Service: CORTLANDT, CAPUANO 10/06/2021 10:00 A M Medical Record Number: [id number] Patient Account Number: [id number] Date of Birth/Sex: Treating RN: Aug 04, 1960 (60 y.o. [name],  [name] Primary Care Provider: [name] Other Clinician: Referring Provider: Treating Provider/Extender: [name] in Treatment: 6 Verbal / Phone Orders: No Diagnosis Coding ICD-10 Coding Code Description I89.0 Lymphedema, not elsewhere classified I87.333 Chronic venous hypertension (idiopathic) with ulcer and inflammation of bilateral lower extremity L97.822 Non-pressure chronic ulcer of other part of left lower leg with fat layer exposed L97.812 Non-pressure chronic ulcer of other part of right lower leg with fat layer exposed N18.30 Chronic kidney disease, stage 3 unspecified I50.42 Chronic combined systolic (congestive) and diastolic (congestive) heart failure I10 Essential (primary) hypertension 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 - Wed. 10/13/21 @ 0915 w/ Allen Derry, Bonita Quin Room # 9 Return A Other: - Arteriography 10/22/21 w/ Dr. Edilia Bo Bathing/ Shower/ Hygiene May shower with protection but do not get wound dressing(s) wet. Edema Control - Lymphedema / SCD / Other Elevate legs to the level of the heart or above for 30 minutes daily and/or when sitting, a frequency of: Avoid standing for long periods of time. Wound Treatment Wound #5 - Lower Leg Wound Laterality: Right, Circumferential Cleanser:  Soap and Water 2 x Per Week/7 Days Discharge Instructions: May shower and wash wound with dial antibacterial soap and water prior to dressing change. Peri-Wound Care: Triamcinolone 15 (g) 2 x Per Week/7 Days Discharge Instructions: Use triamcinolone 15 (g) as directed Peri-Wound Care: Zinc Oxide Ointment 30g tube 2 x Per Week/7 Days Discharge Instructions: Apply Zinc Oxide to periwound with each dressing change Peri-Wound Care: Sween Lotion (Moisturizing  lotion) 2 x Per Week/7 Days Discharge Instructions: Apply moisturizing lotion as directed Prim Dressing: KerraCel Ag Gelling Fiber Dressing, 4x5 in (silver alginate) 2 x Per Week/7 Days ary Discharge Instructions: Apply silver alginate to wound bed as instructed Secondary Dressing: ABD Pad, 5x9 2 x Per Week/7 Days Discharge Instructions: Apply over primary dressing as directed. Compression Wrap: Kerlix Roll 4.5x3.1 (in/yd) 2 x Per Week/7 Days Discharge Instructions: Apply Kerlix and Coban compression as directed. Compression Wrap: Coban Self-Adherent Wrap 4x5 (in/yd) 2 x Per Week/7 Days Discharge Instructions: Apply over Kerlix as directed. Wound #6 - Lower Leg Wound Laterality: Left, Circumferential Cleanser: Soap and Water Discharge Instructions: May shower and wash wound with dial antibacterial soap and water prior to dressing change. Peri-Wound Care: Triamcinolone 15 (g) Discharge Instructions: Use triamcinolone 15 (g) as directed Peri-Wound Care: Zinc Oxide Ointment 30g tube Discharge Instructions: Apply Zinc Oxide to periwound with each dressing change Peri-Wound Care: Sween Lotion (Moisturizing lotion) Discharge Instructions: Apply moisturizing lotion as directed Prim Dressing: KerraCel Ag Gelling Fiber Dressing, 4x5 in (silver alginate) ary Discharge Instructions: Apply silver alginate to wound bed as instructed Secondary Dressing: ABD Pad, 5x9 Discharge Instructions: Apply over primary dressing as directed. Compression Wrap: Kerlix Roll 4.5x3.1 (in/yd) Discharge Instructions: Apply Kerlix and Coban compression as directed. Compression Wrap: Coban Self-Adherent Wrap 4x5 (in/yd) Discharge Instructions: Apply over Kerlix as directed. Electronic Signature(s) Signed: 10/06/2021 4:22:37 PM By: Lenda Kelp PA-C Signed: 10/06/2021 4:25:42 PM By: Fonnie Mu RN Entered By: Fonnie Mu on 10/06/2021  10:47:39 -------------------------------------------------------------------------------- Problem List Details Patient Name: Date of Service: [name], WITHERSPOON 10/06/2021 10:00 A M Medical Record Number: [id number] Patient Account Number: [id number] Date of Birth/Sex: Treating RN: 1960/10/31 (61 y.o. [name], [name] Primary Care Provider: [name] Other Clinician: Referring Provider: Treating Provider/Extender: [name] in Treatment: 6 Active Problems ICD-10 Encounter Code Description Active Date MDM Diagnosis I89.0 Lymphedema, not elsewhere classified 08/25/2021 No Yes I87.333 Chronic venous hypertension (idiopathic) with ulcer and inflammation of 08/25/2021 No Yes bilateral lower extremity L97.822 Non-pressure chronic ulcer of other part of left lower leg with fat layer exposed5/24/2023 No Yes L97.812 Non-pressure chronic ulcer of other part of right lower leg with fat layer 08/25/2021 No Yes exposed N18.30 Chronic kidney disease, stage 3 unspecified 08/25/2021 No Yes I50.42 Chronic combined systolic (congestive) and diastolic (congestive) heart failure 08/25/2021 No Yes I10 Essential (primary) hypertension 08/25/2021 No Yes 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 [name], [name] Primary Care Provider: [name] Other  Clinician: Referring Provider: Treating Provider/Extender: [name] in Treatment: 6 Subjective  Chief Complaint Information obtained from Patient Bilateral Leg Ulcers History of Present Illness (HPI) 02/24/2020 on evaluation today patient presents with obvious chronic lymphedema of the bilateral lower extremities. Fortunately there is no signs of active infection at this time which is great news. With that being said the patient does have a recent treatment with doxycycline 100 mg for 10 days which he has completed. He does have a history of chronic kidney disease stage III as documented in his chart. He also has chronic venous insufficiency with lower extremity lymphedema noted. He has congestive heart failure and hypertension. He tells me currently that he was unaware of the kidney disease before he mentioned this today. He also tells me that his primary care provider had told him to quit taking showers based on what was going on with his legs at this time. Fortunately there is no signs of active infection at this time which is great news. No fevers, chills, nausea, vomiting, or diarrhea. 03/04/2020 on evaluation today patient actually appears to be doing much better in regard to his legs. There is really not much draining a lot of the dry skin is starting to loosen up amount of time to get as much of this off today as possible 03/11/2020 upon evaluation today patient appears to be doing well currently in regard to his legs. He still has a lot of dry skin some which I was able to get off today but overall I think that the biggest thing he needs right now is to be able to wash his legs in the shower to loosen some of this up which I think will then come off much more effectively and quickly without as much of an issue. Fortunately there is no evidence of active infection at this time. No fevers, chills, nausea, vomiting, or diarrhea. Readmission: 06/05/2020 upon  evaluation today patient appears to be doing worse in regard to his bilateral lower extremities. Unfortunately he has had some issues here with his leg started to swell again he tells me he was not able to wear the compression stockings that he got from elastic therapy. He states when he called the leg he said that the measurements did not seem right and subsequently just sent him something she thought would work. Either way he tells me they were very tight and very uncomfortable he just was not able to stick with it. Fortunately there is no signs of active infection at this time. No fevers, chills, nausea, vomiting, or diarrhea. 06/12/2020 on evaluation today patient actually appears to be doing well in regard to his legs. He has a lot of dry skin but nothing that appears to be open at this point. He did not get the Velcro compression wraps but does have the pull up compression stockings which if he wears those can definitely be sufficient here. Readmission: 08-25-2021 upon evaluation today patient appears to be doing well with regard to his legs all things considered. With that being said I do not see anything that actively looks like it is open or draining but I definitely see spots where he had blistering and obvious signs of drainage. Subsequently he is supposed to be having open heart surgery to replace a valve that sounds like but they are not able to do that simply due to the fact that right now he is having more issues currently with his legs and they will not do anything until that is resolved. Fortunately I do not see any evidence of active infection  locally or systemically which is great news. No fevers, chills, nausea, vomiting, or diarrhea. The patient does have a history noted previously of lymphedema, chronic venous hypertension, chronic kidney disease stage III, congestive heart failure, and hypertension. 09-01-2021 upon evaluation today patient actually appears to be healed I do not see  anything open at this time which is great news. Fortunately he is doing excellent and I think they were on the right track. With that being said there were a couple areas where he was so dry that the wrap actually stuck to his leg and this has caused some issues here with a couple small openings remaining but I think by next week these will be sealed up. Overall I think that he needs some lotion twice a day and Ace wraps are probably to be the best way to go to help with compression for now. He does have the vascular appointment upcoming in a couple of days. 6/7; patient comes in today with quite a deterioration from last week. Coupled with this is that he had his arterial studies which were really not very good on the right his ABI was 0.92 and on the left 1.09 but TBI was 0.52 on the right and 0.36 on the left he had monophasic waveforms almost diffusely on the right with an estimated 50 to 74% stenosis of the distal SFA on the left he had similar findings. 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 We are working on getting him a vascular evaluation./Consultation Also looking through his chart he has a lot of other medical problems including severe aortic stenosis and he has been referred to cardiac surgery. He has atrial fibrillation on Eliquis 09-15-2021 upon evaluation today patient appears to be doing better than it sounds like he was last week. Fortunately I do not see any evidence of active infection locally or systemically which is great news. No fevers, chills, nausea, vomiting, or diarrhea. He does have an appointment with vascular tomorrow. 09-22-2021 upon evaluation today patient appears to be doing well with regard to his legs all things considered. I did review his note and it appears that he has significant peripheral vascular  disease bilaterally in the lower extremities. Subsequently this seems to be even a femoral and iliac arteries. Subsequently I do believe that the arteriogram is probably going to be a good way to go as far as trying to improve his blood flow as noted in the record from Dr. Deitra Mayo on 09-16-2021. 09-29-2021 upon evaluation today patient appears to be doing decently well today in regard to his wounds on the legs. Things are actually showing signs of good improvement and I am very pleased in that regard. I do not see any signs of infection at this time which is great news and in fact everything is pretty dry today. He does have his arteriogram with Dr. Doren Custard on Friday. With that being said once that is complete I really think things will be significantly improved and hopefully he should be able to not only feel better but also continue with his own compression socks. 10-06-2021 upon evaluation today patient appears to be doing well with regard to his legs in fact there is very little open at this point. Fortunately I do not see any signs of active infection which is great news. No fevers, chills, nausea, vomiting, or diarrhea.  Objective Constitutional Well-nourished and well-hydrated in no acute distress. Vitals Time Taken: 10:36 AM, Height: 69 in, Weight: 215 lbs, BMI: 31.7, Temperature: 98.2 F, Pulse: 83 bpm, Respiratory Rate: 18 breaths/min, Blood Pressure: 137/100 mmHg. Respiratory normal breathing without difficulty. Psychiatric this patient is able to make decisions and demonstrates good insight into disease process. Alert and Oriented x 3. pleasant and cooperative. General Notes: Patient's wound bed actually showed signs of good granulation and epithelization at this point. Fortunately I do not see any evidence of active infection at this time which is great news and overall I am extremely pleased with where we are at this point. Unfortunately he was not able to get his  procedure done last week which should have taken place due to the fact that they had not told him to stop his blood thinners. Is now scheduled for July 21. Integumentary (Hair, Skin) Wound #5 status is Open. Original cause of wound was Gradually Appeared. The date acquired was: 07/03/2021. The wound has been in treatment 6 weeks. The wound is located on the Right,Circumferential Lower Leg. The wound measures 36cm length x 36cm width x 0.1cm depth; 1017.876cm^2 area and 101.788cm^3 volume. There is a large amount of serosanguineous drainage noted. The wound margin is distinct with the outline attached to the wound base. There is small (1-33%) red, pink granulation within the wound bed. There is a large (67-100%) amount of necrotic tissue within the wound bed including Adherent Slough. Wound #6 status is Open. Original cause of wound was Gradually Appeared. The date acquired was: 07/03/2021. The wound has been in treatment 6 weeks. The wound is located on the Left,Circumferential Lower Leg. The wound measures 32.5cm length x 37cm width x 0.1cm depth; 944.441cm^2 area and 94.444cm^3 volume. There is no tunneling or undermining noted. There is a large amount of serosanguineous drainage noted. The wound margin is distinct with the outline attached to the wound base. There is small (1-33%) red granulation within the wound bed. There is a large (67-100%) amount of necrotic tissue within the wound bed including Eschar and Adherent Slough. 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 - Wed. 10/13/21 @ 0915 w/ Allen DerryHoyt Stone, Bonita QuinPAand Lauran Room # 9 Other: - Arteriography 10/22/21 w/ Dr. Lily Peerickson Bathing/ Shower/ Hygiene: May shower with protection but do not get wound dressing(s) wet. Edema Control - Lymphedema / SCD / Other: Elevate legs to the level of the heart or above for 30 minutes daily and/or when sitting, a frequency of: Avoid standing for long periods of time. WOUND #5: - Lower Leg Wound Laterality: Right, Circumferential Cleanser: Soap and Water 2 x Per Week/7 Days Discharge Instructions: May shower and wash wound with dial antibacterial soap and water prior to dressing change. Peri-Wound Care: Triamcinolone 15 (g) 2 x Per Week/7 Days Discharge Instructions: Use triamcinolone 15 (g) as directed Peri-Wound Care: Zinc Oxide Ointment 30g tube 2 x Per Week/7 Days Discharge Instructions: Apply Zinc Oxide to periwound with each  dressing change Peri-Wound Care: Sween Lotion (Moisturizing lotion) 2 x Per Week/7 Days Discharge Instructions: Apply moisturizing lotion as directed Prim Dressing: KerraCel Ag Gelling Fiber Dressing, 4x5 in (silver alginate) 2 x Per Week/7 Days ary Discharge Instructions: Apply silver alginate to wound bed as instructed Secondary Dressing: ABD Pad, 5x9 2 x Per Week/7 Days Discharge Instructions: Apply over primary dressing as directed. Com pression Wrap: Kerlix Roll 4.5x3.1 (in/yd) 2 x Per Week/7 Days Discharge Instructions: Apply Kerlix and Coban compression as directed. Com pression Wrap: Coban Self-Adherent Wrap 4x5 (in/yd) 2 x Per Week/7 Days Discharge Instructions: Apply over Kerlix as directed. WOUND #6: - Lower Leg Wound Laterality: Left, Circumferential Cleanser: Soap and Water Discharge Instructions:  May shower and wash wound with dial antibacterial soap and water prior to dressing change. Peri-Wound Care: Triamcinolone 15 (g) Discharge Instructions: Use triamcinolone 15 (g) as directed Peri-Wound Care: Zinc Oxide Ointment 30g tube Discharge Instructions: Apply Zinc Oxide to periwound with each dressing change Peri-Wound Care: Sween Lotion (Moisturizing lotion) Discharge Instructions: Apply moisturizing lotion as directed Prim Dressing: KerraCel Ag Gelling Fiber Dressing, 4x5 in (silver alginate) ary Discharge Instructions: Apply silver alginate to wound bed as instructed Secondary Dressing: ABD Pad, 5x9 Discharge Instructions: Apply over primary dressing as directed. Com pression Wrap: Kerlix Roll 4.5x3.1 (in/yd) Discharge Instructions: Apply Kerlix and Coban compression as directed. Com pression Wrap: Coban Self-Adherent Wrap 4x5 (in/yd) Discharge Instructions: Apply over Kerlix as directed. 1. Would recommend currently that we going continue with the wound care measures as before and the patient is in agreement with plan this includes the use of the zinc to protect the skin and the silver alginate over the open areas of which she only has 1 on each leg now. 2. I am also can recommend that we continue with the Coban and Kerlix wraps which I do feel like you are doing decently well for him at this point. We will see patient back for reevaluation in 1 week here in the clinic. If anything worsens or changes patient will contact our office for additional recommendations. Electronic Signature(s) Signed: 10/06/2021 11:29:35 AM By: [name] PA-C Entered By: [name] on 10/06/2021 11:29:35 -------------------------------------------------------------------------------- SuperBill Details Patient Name: Date of Service: [name], FAUCI 10/06/2021 Medical Record Number: [id number] Patient Account Number: [id number] Date of Birth/Sex: Treating RN: 1960/04/24 (61 y.o. 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 L97.822 Non-pressure chronic ulcer of other part of left lower leg with fat layer exposed L97.812 Non-pressure chronic ulcer of other part of right lower leg with fat layer exposed N18.30 Chronic kidney disease, stage 3 unspecified I50.42 Chronic combined systolic (congestive) and diastolic (congestive) heart failure I10 Essential (primary) hypertension 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, 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 Facility Procedures CPT4 Code: [id number] Description: 99214 - WOUND CARE VISIT-LEV 4 EST PT Modifier: Quantity: 1 Physician Procedures : CPT4 Code Description Modifier [id number] - WC PHYS LEVEL 3 - EST PT ICD-10 Diagnosis Description I89.0 Lymphedema, not elsewhere classified I87.333 Chronic venous hypertension (idiopathic) with 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

## 2021-10-06 NOTE — Progress Notes (Signed)
[name], [name] ([id number]) Visit Report for 10/06/2021 Arrival Information Details Patient Name: Date of Service: [name], [name] 10/06/2021 10:00 A M Medical Record Number: [id number] Patient Account Number: [id number] Date of Birth/Sex: Treating RN: 1961-02-20 (61 y.o. [name], [name] Primary Care Garyson Stelly: [name] Other Clinician: Referring Shlomo Seres: Treating Chosen Garron/Extender: [name] in Treatment: 6 Visit Information History Since Last Visit Added or deleted any medications: No Patient Arrived: Ambulatory Any new allergies or adverse reactions: No Arrival Time: 10:33 Had a fall or experienced change in No Accompanied By: self activities of daily living that may affect Transfer Assistance: None risk of falls: Patient Identification Verified: Yes Signs or symptoms of abuse/neglect since last visito No Secondary Verification Process Completed: Yes Hospitalized since last visit: No Patient Requires Transmission-Based Precautions: No Has Dressing in Place as Prescribed: Yes Patient Has Alerts: No Has Compression in Place as Prescribed: Yes Pain Present Now: No Electronic Signature(s) Signed: 10/06/2021 4:25:42 PM By: Fonnie Mu RN Entered By: Fonnie Mu on 10/06/2021 10:36:38 -------------------------------------------------------------------------------- Clinic Level of Care Assessment Details Patient Name: Date of Service: [name], [name] 10/06/2021 10:00 A M Medical Record Number: [id number] Patient Account Number: [id number] Date of Birth/Sex: Treating RN: 1960-04-14 (61 y.o. [name], [name] Primary Care Cherlynn Popiel: [name] Other Clinician: Referring Ieisha Gao: Treating Deano Tomaszewski/Extender: [name] in Treatment: 6 Clinic Level of Care Assessment Items TOOL 4 Quantity Score X- 1 0 Use when only an EandM is performed on FOLLOW-UP visit ASSESSMENTS - Nursing Assessment / Reassessment X- 1  10 Reassessment of Co-morbidities (includes updates in patient status) X- 1 5 Reassessment of Adherence to Treatment Plan ASSESSMENTS - Wound and Skin A ssessment / Reassessment []  - 0 Simple Wound Assessment / Reassessment - one wound X- 2 5 Complex Wound Assessment / Reassessment - multiple wounds []  - 0 Dermatologic / Skin Assessment (not related to wound area) ASSESSMENTS - Focused Assessment X- 2 5 Circumferential Edema Measurements - multi extremities []  - 0 Nutritional Assessment / Counseling / Intervention []  - 0 Lower Extremity Assessment (monofilament, tuning fork, pulses) []  - 0 Peripheral Arterial Disease Assessment (using hand held doppler) ASSESSMENTS - Ostomy and/or Continence Assessment and Care []  - 0 Incontinence Assessment and Management []  - 0 Ostomy Care Assessment and Management (repouching, etc.) PROCESS - Coordination of Care []  - 0 Simple Patient / Family Education for ongoing care X- 1 20 Complex (extensive) Patient / Family Education for ongoing care X- 1 10 Staff obtains , Records, T Results / Process Orders est []  - 0 Staff telephones HHA, Nursing Homes / Clarify orders / etc []  - 0 Routine Transfer to another Facility (non-emergent condition) []  - 0 Routine Hospital Admission (non-emergent condition) []  - 0 New Admissions / / Ordering NPWT Apligraf, etc. , []  - 0 Emergency Hospital Admission (emergent condition) []  - 0 Simple Discharge Coordination X- 1 15 Complex (extensive) Discharge Coordination PROCESS - Special Needs []  - 0 Pediatric / Minor Patient Management []  - 0 Isolation Patient Management []  - 0 Hearing / Language / Visual special needs []  - 0 Assessment of Community assistance (transportation, D/C planning, etc.) []  - 0 Additional assistance / Altered mentation []  - 0 Support Surface(s) Assessment (bed, cushion, seat, etc.) INTERVENTIONS - Wound Cleansing / Measurement []  -  0 Simple Wound Cleansing - one wound X- 2 5 Complex Wound Cleansing - multiple wounds X- 1 5 Wound Imaging (photographs - any number of wounds) []  - 0 Wound  Tracing (instead of photographs) []  - 0 Simple Wound Measurement - one wound X- 2 5 Complex Wound Measurement - multiple wounds INTERVENTIONS - Wound Dressings []  - 0 Small Wound Dressing one or multiple wounds X- 2 15 Medium Wound Dressing one or multiple wounds []  - 0 Large Wound Dressing one or multiple wounds X- 1 5 Application of Medications - topical []  - 0 Application of Medications - injection INTERVENTIONS - Miscellaneous []  - 0 External ear exam []  - 0 Specimen Collection (cultures, biopsies, blood, body fluids, etc.) []  - 0 Specimen(s) / Culture(s) sent or taken to Lab for analysis []  - 0 Patient Transfer (multiple staff / / Similar devices) []  - 0 Simple Staple / Suture removal (25 or less) []  - 0 Complex Staple / Suture removal (26 or more) []  - 0 Hypo / Hyperglycemic Management (close monitor of Blood Glucose) []  - 0 Ankle / Brachial Index (ABI) - do not check if billed separately X- 1 5 Vital Signs Has the patient been seen at the hospital within the last three years: Yes Total Score: 145 Level Of Care: New/Established - Level 4 Electronic Signature(s) Signed: 10/06/2021 4:25:42 PM By: RN Entered By: on 10/06/2021 10:33:45 -------------------------------------------------------------------------------- Encounter Discharge Information Details Patient Name: Date of Service: [name], [name] 10/06/2021 10:00 A M Medical Record Number: Patient Account Number: Nurse, adult Date of Birth/Sex: Treating RN: 01/03/1961 (61 y.o. , [name] Primary Care Graciana Sessa: Other Clinician: Referring Doil Kamara: Treating Nakiya Rallis/Extender: 12/07/2021 in Treatment: 6 Encounter Discharge Information  Items Discharge Condition: Stable Ambulatory Status: Cane Discharge Destination: Home Transportation: Private Auto Accompanied By: self Schedule Follow-up Appointment: Yes Clinical Summary of Care: Patient Declined Electronic Signature(s) Signed: 10/06/2021 4:25:42 PM By: Fonnie Mu RN Entered By: 12/07/2021 on 10/06/2021 10:48:40 -------------------------------------------------------------------------------- Lower Extremity Assessment Details Patient Name: Date of Service: [name], [name] 10/06/2021 10:00 A M Medical Record Number: [id number] Patient Account Number: 01/04/1961 Date of Birth/Sex: Treating RN: 03-08-1961 (61 y.o. [name], [name] Primary Care Tolbert Matheson: [name] Other Clinician: Referring Shawndell Schillaci: Treating Shiza Thelen/Extender: 12/07/2021 in Treatment: 6 Edema Assessment Assessed: Fonnie Mu: No] Fonnie Mu: No] Edema: [Left: Yes] [Right: Yes] Calf Left: Right: Point of Measurement: From Medial Instep 36.5 cm 37.5 cm Ankle Left: Right: Point of Measurement: From Medial Instep 24 cm 25.5 cm Vascular Assessment Pulses: Dorsalis Pedis Palpable: [Left:Yes] [Right:Yes] Posterior Tibial Palpable: [Left:Yes] [Right:Yes] Electronic Signature(s) Signed: 10/06/2021 4:25:42 PM By: Shawn Medico RN Entered By: 12/07/2021 on 10/06/2021 10:42:15 -------------------------------------------------------------------------------- Multi-Disciplinary Care Plan Details Patient Name: Date of Service: [name], [name] 10/06/2021 10:00 A M Medical Record Number: [id number] Patient Account Number: [name] Date of Birth/Sex: Treating RN: [birth year] (61 y.o. [name], [name] Primary Care Eidan Muellner: [name] Other Clinician: Referring Shanen Norris: Treating Evelyn Aguinaldo/Extender: 12/07/2021 in Treatment: 6 Active Inactive Electronic Signature(s) Signed: 10/06/2021 4:25:42 PM By: Fonnie Mu  RN Entered By: 12/07/2021 on 10/06/2021 10:32:47 -------------------------------------------------------------------------------- Pain Assessment Details Patient Name: Date of Service: [name], [name] 10/06/2021 10:00 A M Medical Record Number: [id number] Patient Account Number: 01/04/1961 Date of Birth/Sex: Treating RN: 1960/11/24 (61 y.o. [name], [name] Primary Care Curtiss Mahmood: [name] Other Clinician: Referring Marilyne Haseley: Treating Demetrios Byron/Extender: 12/07/2021 in Treatment: 6 Active Problems Location of Pain Severity and Description of Pain Patient Has Paino No Site Locations Pain Management and Medication Current Pain Management: Electronic Signature(s) Signed: 10/06/2021 4:25:42 PM  By: Fonnie Mu RN Entered By: Fonnie Mu on 10/06/2021 10:37:15 -------------------------------------------------------------------------------- Patient/Caregiver Education Details Patient Name: Date of Service: [name] 7/5/2023andnbsp10:00 A M Medical Record Number: [id number] Patient Account Number: [id number] Date of Birth/Gender: Treating RN: 1960-12-05 (61 y.o. [name], [name] Primary Care Physician: [name] Other Clinician: Referring Physician: Treating Physician/Extender: [name] in Treatment: 6 Education Assessment Education Provided To: Patient Education Topics Provided Wound/Skin Impairment: Methods: Explain/Verbal Responses: State content correctly Electronic Signature(s) Signed: 10/06/2021 4:25:42 PM By: Fonnie Mu RN Entered By: Fonnie Mu on 10/06/2021 10:32:58 -------------------------------------------------------------------------------- Wound Assessment Details Patient Name: Date of Service: [name], [name] 10/06/2021 10:00 A M Medical Record Number: [id number] Patient Account Number: [id number] Date of Birth/Sex: Treating RN: 16-Sep-1960 (61 y.o. [name], [name] Primary Care Cozetta Seif: [name] Other Clinician: Referring Verita Kuroda: Treating Massiel Stipp/Extender: [name] in Treatment: 6 Wound Status Wound Number: 5 Primary Etiology: Venous Leg Ulcer Wound Location: Right, Circumferential Lower Leg Wound Status: Open Wounding Event: Gradually Appeared Comorbid History: Congestive Heart Failure, Hypertension Date Acquired: 07/03/2021 Weeks Of Treatment: 6 Clustered Wound: No Photos Wound Measurements Length: (cm) 36 Width: (cm) 36 Depth: (cm) 0.1 Area: (cm) 1017.876 Volume: (cm) 101.788 % Reduction in Area: -10% % Reduction in Volume: -10% Epithelialization: None Wound Description Classification: Full Thickness With Exposed Support Struct Wound Margin: Distinct, outline attached Exudate Amount: Large Exudate Type: Serosanguineous Exudate Color: red, brown ures Foul Odor After Cleansing: No Slough/Fibrino Yes Wound Bed Granulation Amount: Small (1-33%) Exposed Structure Granulation Quality: Red, Pink Fascia Exposed: No Necrotic Amount: Large (67-100%) Fat Layer (Subcutaneous Tissue) Exposed: No Necrotic Quality: Adherent Slough Tendon Exposed: No Muscle Exposed: No Joint Exposed: No Bone Exposed: No Treatment Notes Wound #5 (Lower Leg) Wound Laterality: Right, Circumferential Cleanser Soap and Water Discharge Instruction: May shower and wash wound with dial antibacterial soap and water prior to dressing change. Peri-Wound Care Triamcinolone 15 (g) Discharge Instruction: Use triamcinolone 15 (g) as directed Zinc Oxide Ointment 30g tube Discharge Instruction: Apply Zinc Oxide to periwound with each dressing change Sween Lotion (Moisturizing lotion) Discharge Instruction: Apply moisturizing lotion as directed Topical Primary Dressing KerraCel Ag Gelling Fiber Dressing, 4x5 in (silver alginate) Discharge Instruction: Apply silver alginate to wound bed as  instructed Secondary Dressing ABD Pad, 5x9 Discharge Instruction: Apply over primary dressing as directed. Secured With Compression Wrap Kerlix Roll 4.5x3.1 (in/yd) Discharge Instruction: Apply Kerlix and Coban compression as directed. Coban Self-Adherent Wrap 4x5 (in/yd) Discharge Instruction: Apply over Kerlix as directed. Compression Stockings Add-Ons Electronic Signature(s) Signed: 10/06/2021 4:25:42 PM By: Fonnie Mu RN Entered By: Fonnie Mu on 10/06/2021 10:43:39 -------------------------------------------------------------------------------- Wound Assessment Details Patient Name: Date of Service: [name], [name] 10/06/2021 10:00 A M Medical Record Number: [id number] Patient Account Number: [id number] Date of Birth/Sex: Treating RN: [birth year] (61 y.o. [name], [name] Primary Care Marcile Fuquay: [name] Other Clinician: Referring Shuronda Santino: Treating Aliani Caccavale/Extender: [name] in Treatment: 6 Wound Status Wound Number: 6 Primary Etiology: Venous Leg Ulcer Wound Location: Left, Circumferential Lower Leg Wound Status: Open Wounding Event: Gradually Appeared Comorbid History: Congestive Heart Failure, Hypertension Date Acquired: 07/03/2021 Weeks Of Treatment: 6 Clustered Wound: No Photos Wound Measurements Length: (cm) 32.5 Width: (cm) 37 Depth: (cm) 0.1 Area: (cm) 944.441 Volume: (cm) 94.444 % Reduction in Area: -21.2% % Reduction in Volume: -21.2% Epithelialization: None Tunneling: No Undermining: No Wound Description Classification: Full Thickness With Exposed Support Struct Wound Margin: Distinct, outline attached Exudate Amount: Large Exudate Type:  Serosanguineous Exudate Color: red, brown ures Wound Bed Granulation Amount: Small (1-33%) Exposed Structure Granulation Quality: Red Fascia Exposed: No Necrotic Amount: Large (67-100%) Fat Layer (Subcutaneous Tissue) Exposed: No Necrotic Quality: Eschar,  Adherent Slough Tendon Exposed: No Muscle Exposed: No Joint Exposed: No Bone Exposed: No Treatment Notes Wound #6 (Lower Leg) Wound Laterality: Left, Circumferential Cleanser Soap and Water Discharge Instruction: May shower and wash wound with dial antibacterial soap and water prior to dressing change. Peri-Wound Care Triamcinolone 15 (g) Discharge Instruction: Use triamcinolone 15 (g) as directed Zinc Oxide Ointment 30g tube Discharge Instruction: Apply Zinc Oxide to periwound with each dressing change Sween Lotion (Moisturizing lotion) Discharge Instruction: Apply moisturizing lotion as directed Topical Primary Dressing KerraCel Ag Gelling Fiber Dressing, 4x5 in (silver alginate) Discharge Instruction: Apply silver alginate to wound bed as instructed Secondary Dressing ABD Pad, 5x9 Discharge Instruction: Apply over primary dressing as directed. Secured With Compression Wrap Kerlix Roll 4.5x3.1 (in/yd) Discharge Instruction: Apply Kerlix and Coban compression as directed. Coban Self-Adherent Wrap 4x5 (in/yd) Discharge Instruction: Apply over Kerlix as directed. Compression Stockings Add-Ons Electronic Signature(s) Signed: 10/06/2021 4:25:42 PM By: Fonnie Mu RN Entered By: Fonnie Mu on 10/06/2021 10:43:55 -------------------------------------------------------------------------------- Vitals Details Patient Name: Date of Service: [name], [name] 10/06/2021 10:00 A M Medical Record Number: [id number] Patient Account Number: [id number] Date of Birth/Sex: Treating RN: 18-Jul-1960 (61 y.o. [name], [name] Primary Care Kaveh Kissinger: [name] Other Clinician: Referring Atasha Colebank: Treating Yanina Knupp/Extender: [name] in Treatment: 6 Vital Signs Time Taken: 10:36 Temperature (F): 98.2 Height (in): 69 Pulse (bpm): 83 Weight (lbs): 215 Respiratory Rate (breaths/min): 18 Body Mass Index (BMI): 31.7 Blood Pressure (mmHg):  137/100 Reference Range: 80 - 120 mg / dl Electronic Signature(s) Signed: 10/06/2021 4:25:42 PM By: Fonnie Mu RN Entered By: Fonnie Mu on 10/06/2021 10:37:08

## 2021-10-09 LAB — FECAL OCCULT BLOOD, IMMUNOCHEMICAL: Fecal Occult Bld: NEGATIVE

## 2021-10-13 ENCOUNTER — Encounter (HOSPITAL_BASED_OUTPATIENT_CLINIC_OR_DEPARTMENT_OTHER): Payer: Medicare Other | Admitting: Physician Assistant

## 2021-10-13 DIAGNOSIS — N183 Chronic kidney disease, stage 3 unspecified: Secondary | ICD-10-CM | POA: Diagnosis not present

## 2021-10-13 DIAGNOSIS — I87333 Chronic venous hypertension (idiopathic) with ulcer and inflammation of bilateral lower extremity: Secondary | ICD-10-CM | POA: Diagnosis not present

## 2021-10-13 DIAGNOSIS — L97812 Non-pressure chronic ulcer of other part of right lower leg with fat layer exposed: Secondary | ICD-10-CM | POA: Diagnosis not present

## 2021-10-13 DIAGNOSIS — I70248 Atherosclerosis of native arteries of left leg with ulceration of other part of lower left leg: Secondary | ICD-10-CM | POA: Diagnosis not present

## 2021-10-13 DIAGNOSIS — I130 Hypertensive heart and chronic kidney disease with heart failure and stage 1 through stage 4 chronic kidney disease, or unspecified chronic kidney disease: Secondary | ICD-10-CM | POA: Diagnosis not present

## 2021-10-13 DIAGNOSIS — L97822 Non-pressure chronic ulcer of other part of left lower leg with fat layer exposed: Secondary | ICD-10-CM | POA: Diagnosis not present

## 2021-10-13 DIAGNOSIS — I872 Venous insufficiency (chronic) (peripheral): Secondary | ICD-10-CM | POA: Diagnosis not present

## 2021-10-13 DIAGNOSIS — I5042 Chronic combined systolic (congestive) and diastolic (congestive) heart failure: Secondary | ICD-10-CM | POA: Diagnosis not present

## 2021-10-13 DIAGNOSIS — I890 Lymphedema, not elsewhere classified: Secondary | ICD-10-CM | POA: Diagnosis not present

## 2021-10-13 DIAGNOSIS — I350 Nonrheumatic aortic (valve) stenosis: Secondary | ICD-10-CM | POA: Diagnosis not present

## 2021-10-13 NOTE — Progress Notes (Addendum)
RENOLD, SUITE ([id number]) Visit Report for 10/13/2021 Chief Complaint Document Details Patient Name: Date of Service: SHAHZAIB, PRICHARD 10/13/2021 9:15 A M Medical Record Number: [id number] Patient Account Number: [id number] Date of Birth/Sex: Treating RN: 08/08/1960 (61 y.o. [name], [name] Primary Care Provider: [name] Other Clinician: Referring Provider: Treating Provider/Extender: [name] in Treatment: 7 Information Obtained from: Patient Chief Complaint Bilateral Leg Ulcers Electronic Signature(s) Signed: 10/13/2021 9:01:27 AM By: [name] PA-C Entered By: [name] on 10/13/2021 09:01:27 -------------------------------------------------------------------------------- HPI Details Patient Name: Date of Service: KEYAUN, MATTA 10/13/2021 9:15 A M Medical Record Number: [id number] Patient Account Number: [id number] Date of Birth/Sex: Treating RN: Apr 26, 1960 (61 y.o. [name], [name] Primary Care Provider: [name] Other Clinician: Referring Provider: Treating Provider/Extender: [name] in Treatment: 7 History of Present Illness HPI Description: 02/24/2020 on evaluation today patient presents with obvious chronic lymphedema of the bilateral lower extremities. Fortunately there is no signs of active infection at this time which is great news. With that being said the patient does have a recent treatment with doxycycline 100 mg for 10 days which he has completed. He does have a history of chronic kidney disease stage III as documented in his chart. He also has chronic venous insufficiency with lower extremity lymphedema noted. He has congestive heart failure and hypertension. He tells me currently that he was unaware of the kidney disease before he mentioned this today. He also tells me that his primary care provider had told him to quit taking showers based on what was going on with his legs at this  time. Fortunately there is no signs of active infection at this time which is great news. No fevers, chills, nausea, vomiting, or diarrhea. 03/04/2020 on evaluation today patient actually appears to be doing much better in regard to his legs. There is really not much draining a lot of the dry skin is starting to loosen up amount of time to get as much of this off today as possible 03/11/2020 upon evaluation today patient appears to be doing well currently in regard to his legs. He still has a lot of dry skin some which I was able to get off today but overall I think that the biggest thing he needs right now is to be able to wash his legs in the shower to loosen some of this up which I think will then come off much more effectively and quickly without as much of an issue. Fortunately there is no evidence of active infection at this time. No fevers, chills, nausea, vomiting, or diarrhea. Readmission: 06/05/2020 upon evaluation today patient appears to be doing worse in regard to his bilateral lower extremities. Unfortunately he has had some issues here with his leg started to swell again he tells me he was not able to wear the compression stockings that he got from elastic therapy. He states when he called the leg he said that the measurements did not seem right and subsequently just sent him something she thought would work. Either way he tells me they were very tight and very uncomfortable he just was not able to stick with it. Fortunately there is no signs of active infection at this time. No fevers, chills, nausea, vomiting, or diarrhea. 06/12/2020 on evaluation today patient actually appears to be doing well in regard to his legs. He has a lot of dry skin but nothing that appears to be open at this point. He did not  get the Velcro compression wraps but does have the pull up compression stockings which if he wears those can definitely be sufficient here. Readmission: 08-25-2021 upon evaluation today  patient appears to be doing well with regard to his legs all things considered. With that being said I do not see anything that actively looks like it is open or draining but I definitely see spots where he had blistering and obvious signs of drainage. Subsequently he is supposed to be having open heart surgery to replace a valve that sounds like but they are not able to do that simply due to the fact that right now he is having more issues currently with his legs and they will not do anything until that is resolved. Fortunately I do not see any evidence of active infection locally or systemically which is great news. No fevers, chills, nausea, vomiting, or diarrhea. The patient does have a history noted previously of lymphedema, chronic venous hypertension, chronic kidney disease stage III, congestive heart failure, and hypertension. 09-01-2021 upon evaluation today patient actually appears to be healed I do not see anything open at this time which is great news. Fortunately he is doing excellent and I think they were on the right track. With that being said there were a couple areas where he was so dry that the wrap actually stuck to his leg and this has caused some issues here with a couple small openings remaining but I think by next week these will be sealed up. Overall I think that he needs some lotion twice a day and Ace wraps are probably to be the best way to go to help with compression for now. He does have the vascular appointment upcoming in a couple of days. 6/7; patient comes in today with quite a deterioration from last week. Coupled with this is that he had his arterial studies which were really not very good on the right his ABI was 0.92 and on the left 1.09 but TBI was 0.52 on the right and 0.36 on the left he had monophasic waveforms almost diffusely on the right with an estimated 50 to 74% stenosis of the distal SFA on the left he had similar findings. 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 We are working on getting him a vascular evaluation./Consultation Also looking through his chart he has a lot of other medical problems including severe aortic stenosis and he has been referred to cardiac surgery. He has atrial fibrillation on Eliquis 09-15-2021 upon evaluation today patient appears to be doing better than it sounds like he was last week. Fortunately I do not see any evidence of active infection locally or systemically which is great news. No fevers, chills, nausea, vomiting, or diarrhea. He does have an appointment with vascular tomorrow. 09-22-2021 upon evaluation today patient appears to be doing well with regard to his legs all things considered. I did review his note and it appears that he has significant peripheral vascular disease bilaterally in the lower extremities. Subsequently this seems to be even a femoral and iliac arteries. Subsequently I do believe that the arteriogram is probably going to be a good way to go as far as trying to improve his blood flow as noted in the record from Dr. Deitra Mayo on 09-16-2021. 09-29-2021 upon evaluation today patient appears to be doing decently well today in regard  to his wounds on the legs. Things are actually showing signs of good improvement and I am very pleased in that regard. I do not see any signs of infection at this time which is great news and in fact everything is pretty dry today. He does have his arteriogram with Dr. Durwin Nora on Friday. With that being said once that is complete I really think things will be significantly improved and hopefully he should be able to not only feel better but also continue with his own compression socks. 10-06-2021 upon evaluation today patient appears to be doing well with regard to his legs in fact there is very  little open at this point. Fortunately I do not see any signs of active infection which is great news. No fevers, chills, nausea, vomiting, or diarrhea. 10-13-2021 upon evaluation today patient's legs actually appear to be doing decently well. I am really seeing not much in the way of drainage at this point. Fortunately I think that he is on the right track and I believe that we are very close to complete resolution and discharge and at this point I do think that he is tolerating the compression wraps without complication. He has his appointment with vascular coming up on the 21st of this month which is just a week and a half away. Electronic Signature(s) Signed: 10/13/2021 10:06:56 AM By: Lenda Kelp PA-C Entered By: Lenda Kelp on 10/13/2021 10:06:56 -------------------------------------------------------------------------------- Physical Exam Details Patient Name: Date of Service: CHANZE, TEAGLE 10/13/2021 9:15 A M Medical Record Number: [id number] Patient Account Number: [id number] Date of Birth/Sex: Treating RN: Dec 13, 1960 (61 y.o. [name], [name] Primary Care Provider: [name] Other Clinician: Referring Provider: Treating Provider/Extender: [name] in Treatment: 7 Constitutional Well-nourished and well-hydrated in no acute distress. Respiratory normal breathing without difficulty. Psychiatric this patient is able to make decisions and demonstrates good insight into disease process. Alert and Oriented x 3. pleasant and cooperative. Notes Upon inspection patient's wound bed actually showed signs of good granulation and epithelization at this point. Fortunately I do not see any signs of active infection locally or systemically which is great news and overall I am extremely pleased with where things stand at this time. Electronic Signature(s) Signed: 10/13/2021 10:07:14 AM By: Lenda Kelp PA-C Entered By: Lenda Kelp on 10/13/2021  10:07:14 -------------------------------------------------------------------------------- Physician Orders Details Patient Name: Date of Service: RUXIN, RANSOME 10/13/2021 9:15 A M Medical Record Number: [id number] Patient Account Number: [id number] Date of Birth/Sex: Treating RN: 1960-06-11 (61 y.o. [name], [name] Primary Care Provider: [name] Other Clinician: Referring Provider: Treating Provider/Extender: [name] in Treatment: 7 Verbal / Phone Orders: No Diagnosis Coding ICD-10 Coding Code Description I89.0 Lymphedema, not elsewhere classified I87.333 Chronic venous hypertension (idiopathic) with ulcer and inflammation of bilateral lower extremity L97.822 Non-pressure chronic ulcer of other part of left lower leg with fat layer exposed L97.812 Non-pressure chronic ulcer of other part of right lower leg with fat layer exposed N18.30 Chronic kidney disease, stage 3 unspecified I50.42 Chronic combined systolic (congestive) and diastolic (congestive) heart failure I10 Essential (primary) hypertension 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 - Wed. 10/20/21 @1045  w/ , [facility] Room # 9 Return A Other: - Arteriography 10/22/21 w/ Dr. 10/24/21 Bathing/ Shower/ Hygiene May shower with protection but do  not get wound dressing(s) wet. Edema Control - Lymphedema / SCD / Other Elevate legs to the level of the heart or above for 30 minutes daily and/or when sitting, a frequency of: Avoid standing for long periods of time. Wound Treatment Wound #5 - Lower Leg Wound Laterality: Right, Circumferential Cleanser: Soap and Water 2 x Per Week/7 Days Discharge Instructions: May shower and wash wound with dial antibacterial soap and water prior to dressing  change. Peri-Wound Care: Triamcinolone 15 (g) 2 x Per Week/7 Days Discharge Instructions: Use triamcinolone 15 (g) as directed Peri-Wound Care: Zinc Oxide Ointment 30g tube 2 x Per Week/7 Days Discharge Instructions: Apply Zinc Oxide to periwound with each dressing change Peri-Wound Care: Sween Lotion (Moisturizing lotion) 2 x Per Week/7 Days Discharge Instructions: Apply moisturizing lotion as directed Prim Dressing: KerraCel Ag Gelling Fiber Dressing, 4x5 in (silver alginate) 2 x Per Week/7 Days ary Discharge Instructions: Apply silver alginate to wound bed as instructed Secondary Dressing: ABD Pad, 5x9 2 x Per Week/7 Days Discharge Instructions: Apply over primary dressing as directed. Compression Wrap: Kerlix Roll 4.5x3.1 (in/yd) 2 x Per Week/7 Days Discharge Instructions: Apply Kerlix and Coban compression as directed. Compression Wrap: Coban Self-Adherent Wrap 4x5 (in/yd) 2 x Per Week/7 Days Discharge Instructions: Apply over Kerlix as directed. Wound #6 - Lower Leg Wound Laterality: Left, Circumferential Cleanser: Soap and Water Discharge Instructions: May shower and wash wound with dial antibacterial soap and water prior to dressing change. Peri-Wound Care: Triamcinolone 15 (g) Discharge Instructions: Use triamcinolone 15 (g) as directed Peri-Wound Care: Zinc Oxide Ointment 30g tube Discharge Instructions: Apply Zinc Oxide to periwound with each dressing change Peri-Wound Care: Sween Lotion (Moisturizing lotion) Discharge Instructions: Apply moisturizing lotion as directed Prim Dressing: KerraCel Ag Gelling Fiber Dressing, 4x5 in (silver alginate) ary Discharge Instructions: Apply silver alginate to wound bed as instructed Secondary Dressing: ABD Pad, 5x9 Discharge Instructions: Apply over primary dressing as directed. Compression Wrap: Kerlix Roll 4.5x3.1 (in/yd) Discharge Instructions: Apply Kerlix and Coban compression as directed. Compression Wrap: Coban Self-Adherent Wrap  4x5 (in/yd) Discharge Instructions: Apply over Kerlix as directed. Electronic Signature(s) Signed: 10/13/2021 4:33:23 PM By: [name] PA-C Signed: 10/13/2021 4:57:29 PM By: Rhae Hammock RN Entered By: Rhae Hammock on 10/13/2021 09:44:04 -------------------------------------------------------------------------------- Problem List Details Patient Name: Date of Service: DALANTE, LEBSACK 10/13/2021 9:15 A M Medical Record Number: [id number] Patient Account Number: [id number] Date of Birth/Sex: Treating RN: [birth year] (61 y.o. [name], [name] Primary Care Provider: [name] Other Clinician: Referring Provider: Treating Provider/Extender: [name] in Treatment: 7 Active Problems ICD-10 Encounter Code Description Active Date MDM Diagnosis I89.0 Lymphedema, not elsewhere classified 08/25/2021 No Yes I87.333 Chronic venous hypertension (idiopathic) with ulcer and inflammation of 08/25/2021 No Yes bilateral lower extremity L97.822 Non-pressure chronic ulcer of other part of left lower leg with fat layer exposed5/24/2023 No Yes L97.812 Non-pressure chronic ulcer of other part of right lower leg with fat layer 08/25/2021 No Yes exposed N18.30 Chronic kidney disease, stage 3 unspecified 08/25/2021 No Yes I50.42 Chronic combined systolic (congestive) and diastolic (congestive) heart failure 08/25/2021 No Yes I10 Essential (primary) hypertension 08/25/2021 No Yes 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, right leg 09/08/2021 No Yes I70.248 Atherosclerosis of native arteries of left leg with ulceration of other part of 09/08/2021 No Yes lower leg Inactive Problems Resolved Problems Electronic Signature(s) Signed: 10/13/2021 9:01:16 AM By: [name] PA-C Entered By: [name] on 10/13/2021 09:01:16 -------------------------------------------------------------------------------- Progress Note Details Patient  Name: Date of Service: JAARON, GUNNELLS 10/13/2021 9:15 A M Medical Record Number: [id number] Patient Account Number: [id number] Date of Birth/Sex: Treating RN: February 07, 1961 (61 y.o. [name], [name] Primary Care Provider: [name] Other Clinician: Referring Provider: Treating Provider/Extender: [name] in Treatment: 7 Subjective Chief Complaint Information obtained from Patient Bilateral Leg Ulcers History of Present Illness (HPI) 02/24/2020 on evaluation today patient presents with obvious chronic lymphedema of the bilateral lower extremities. Fortunately there is no signs of active infection at this time which is great news. With that being said the patient does have a recent treatment with doxycycline 100 mg for 10 days which he has completed. He does have a history of chronic kidney disease stage III as documented in his chart. He also has chronic venous insufficiency with lower extremity lymphedema noted. He has congestive heart failure and hypertension. He tells me currently that he was unaware of the kidney disease before he mentioned this today. He also tells me that his primary care provider had told him to quit taking showers based on what was going on with his legs at this time. Fortunately there is no signs of active infection at this time which is great news. No fevers, chills, nausea, vomiting, or diarrhea. 03/04/2020 on evaluation today patient actually appears to be doing much better in regard to his legs. There is really not much draining a lot of the dry skin is starting to loosen up amount of time to get as much of this off today as possible 03/11/2020 upon evaluation today patient appears to be doing well currently in regard to his legs. He still has a lot of dry skin some which I was able to get off today but overall I think that the biggest thing he needs right now is to be able to wash his legs in the shower to loosen some of this up which  I think will then come off much more effectively and quickly without as much of an issue. Fortunately there is no evidence of active infection at this time. No fevers, chills, nausea, vomiting, or diarrhea. Readmission: 06/05/2020 upon evaluation today patient appears to be doing worse in regard to his bilateral lower extremities. Unfortunately he has had some issues here with his leg started to swell again he tells me he was not able to wear the compression stockings that he got from elastic therapy. He states when he called the leg he said that the measurements did not seem right and subsequently just sent him something she thought would work. Either way he tells me they were very tight and very uncomfortable he just was not able to stick with it. Fortunately there is no signs of active infection at this time. No fevers, chills, nausea, vomiting, or diarrhea. 06/12/2020 on evaluation today patient actually appears to be doing well in regard to his legs. He has a lot of dry skin but nothing that appears to be open at this point. He did not get the Velcro compression wraps but does have the pull up compression stockings which if he wears those can definitely be sufficient here. Readmission: 08-25-2021 upon evaluation today patient appears to be doing well with regard to his legs all things considered. With that being said I do not see anything that actively looks like it is open or draining but I definitely see spots where he had blistering and obvious signs of drainage. Subsequently he is supposed to be having open heart surgery to  replace a valve that sounds like but they are not able to do that simply due to the fact that right now he is having more issues currently with his legs and they will not do anything until that is resolved. Fortunately I do not see any evidence of active infection locally or systemically which is great news. No fevers, chills, nausea, vomiting, or diarrhea. The patient does  have a history noted previously of lymphedema, chronic venous hypertension, chronic kidney disease stage III, congestive heart failure, and hypertension. 09-01-2021 upon evaluation today patient actually appears to be healed I do not see anything open at this time which is great news. Fortunately he is doing excellent and I think they were on the right track. With that being said there were a couple areas where he was so dry that the wrap actually stuck to his leg and this has caused some issues here with a couple small openings remaining but I think by next week these will be sealed up. Overall I think that he needs some lotion twice a day and Ace wraps are probably to be the best way to go to help with compression for now. He does have the vascular appointment upcoming in a couple of days. 6/7; patient comes in today with quite a deterioration from last week. Coupled with this is that he had his arterial studies which were really not very good on the right his ABI was 0.92 and on the left 1.09 but TBI was 0.52 on the right and 0.36 on the left he had monophasic waveforms almost diffusely on the right with an estimated 50 to 74% stenosis of the distal SFA on the left he had similar findings. 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 We are working on getting him a vascular evaluation./Consultation Also looking through his chart he has a lot of other medical problems including severe aortic stenosis and he has been referred to cardiac surgery. He has atrial fibrillation on Eliquis 09-15-2021 upon evaluation today patient appears to be doing better than it sounds like he was last week. Fortunately I do not see any evidence of active infection locally or systemically which is great news. No fevers, chills, nausea, vomiting, or diarrhea. He does have  an appointment with vascular tomorrow. 09-22-2021 upon evaluation today patient appears to be doing well with regard to his legs all things considered. I did review his note and it appears that he has significant peripheral vascular disease bilaterally in the lower extremities. Subsequently this seems to be even a femoral and iliac arteries. Subsequently I do believe that the arteriogram is probably going to be a good way to go as far as trying to improve his blood flow as noted in the record from Dr. Waverly Ferrari on 09-16-2021. 09-29-2021 upon evaluation today patient appears to be doing decently well today in regard to his wounds on the legs. Things are actually showing signs of good improvement and I am very pleased in that regard. I do not see any signs of infection at this time which is great news and in fact everything is pretty dry today. He does have his arteriogram with Dr. Durwin Nora on Friday. With that being said once that is complete I really think things will be significantly improved and hopefully he should be able to not only feel better but also continue  with his own compression socks. 10-06-2021 upon evaluation today patient appears to be doing well with regard to his legs in fact there is very little open at this point. Fortunately I do not see any signs of active infection which is great news. No fevers, chills, nausea, vomiting, or diarrhea. 10-13-2021 upon evaluation today patient's legs actually appear to be doing decently well. I am really seeing not much in the way of drainage at this point. Fortunately I think that he is on the right track and I believe that we are very close to complete resolution and discharge and at this point I do think that he is tolerating the compression wraps without complication. He has his appointment with vascular coming up on the 21st of this month which is just a week and a half away. Objective Constitutional Well-nourished and well-hydrated in no  acute distress. Vitals Time Taken: 9:09 AM, Height: 69 in, Weight: 215 lbs, BMI: 31.7, Temperature: 98.5 F, Respiratory Rate: 17 breaths/min, Blood Pressure: 227/127 mmHg. General Notes: re-checked manual bp and it was 240/127; pt. states he feels fine. PA Jeri Cos made aware. Respiratory normal breathing without difficulty. Psychiatric this patient is able to make decisions and demonstrates good insight into disease process. Alert and Oriented x 3. pleasant and cooperative. General Notes: Upon inspection patient's wound bed actually showed signs of good granulation and epithelization at this point. Fortunately I do not see any signs of active infection locally or systemically which is great news and overall I am extremely pleased with where things stand at this time. Integumentary (Hair, Skin) Wound #5 status is Open. Original cause of wound was Gradually Appeared. The date acquired was: 07/03/2021. The wound has been in treatment 7 weeks. The wound is located on the Right,Circumferential Lower Leg. The wound measures 22cm length x 25cm width x 0.1cm depth; 431.969cm^2 area and 43.197cm^3 volume. There is no tunneling or undermining noted. There is a large amount of serosanguineous drainage noted. The wound margin is distinct with the outline attached to the wound base. There is medium (34-66%) red, pink granulation within the wound bed. There is a medium (34-66%) amount of necrotic tissue within the wound bed including Adherent Slough. Wound #6 status is Open. Original cause of wound was Gradually Appeared. The date acquired was: 07/03/2021. The wound has been in treatment 7 weeks. The wound is located on the Left,Circumferential Lower Leg. The wound measures 20cm length x 25cm width x 0.1cm depth; 392.699cm^2 area and 39.27cm^3 volume. There is no tunneling or undermining noted. There is a large amount of serosanguineous drainage noted. The wound margin is distinct with the outline attached to  the wound base. There is small (1-33%) red granulation within the wound bed. There is a large (67-100%) amount of necrotic tissue within the wound bed including Eschar and Adherent Slough. 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 - Wed. 10/20/21 @1045  w/ Jeri Cos, Hiram Comber Room # 9 Other: - Arteriography 10/22/21  w/ Dr. Scot Dock Bathing/ Shower/ Hygiene: May shower with protection but do not get wound dressing(s) wet. Edema Control - Lymphedema / SCD / Other: Elevate legs to the level of the heart or above for 30 minutes daily and/or when sitting, a frequency of: Avoid standing for long periods of time. WOUND #5: - Lower Leg Wound Laterality: Right, Circumferential Cleanser: Soap and Water 2 x Per Week/7 Days Discharge Instructions: May shower and wash wound with dial antibacterial soap and water prior to dressing change. Peri-Wound Care: Triamcinolone 15 (g) 2 x Per Week/7 Days Discharge Instructions: Use triamcinolone 15 (g) as directed Peri-Wound Care: Zinc Oxide Ointment 30g tube 2 x Per Week/7 Days Discharge Instructions: Apply Zinc Oxide to periwound with each dressing change Peri-Wound Care: Sween Lotion (Moisturizing lotion) 2 x Per Week/7 Days Discharge Instructions: Apply moisturizing lotion as directed Prim  Dressing: KerraCel Ag Gelling Fiber Dressing, 4x5 in (silver alginate) 2 x Per Week/7 Days ary Discharge Instructions: Apply silver alginate to wound bed as instructed Secondary Dressing: ABD Pad, 5x9 2 x Per Week/7 Days Discharge Instructions: Apply over primary dressing as directed. Com pression Wrap: Kerlix Roll 4.5x3.1 (in/yd) 2 x Per Week/7 Days Discharge Instructions: Apply Kerlix and Coban compression as directed. Com pression Wrap: Coban Self-Adherent Wrap 4x5 (in/yd) 2 x Per Week/7 Days Discharge Instructions: Apply over Kerlix as directed. WOUND #6: - Lower Leg Wound Laterality: Left, Circumferential Cleanser: Soap and Water Discharge Instructions: May shower and wash wound with dial antibacterial soap and water prior to dressing change. Peri-Wound Care: Triamcinolone 15 (g) Discharge Instructions: Use triamcinolone 15 (g) as directed Peri-Wound Care: Zinc Oxide Ointment 30g tube Discharge Instructions: Apply Zinc Oxide to periwound with each dressing change Peri-Wound Care: Sween Lotion (Moisturizing lotion) Discharge Instructions: Apply moisturizing lotion as directed Prim Dressing: KerraCel Ag Gelling Fiber Dressing, 4x5 in (silver alginate) ary Discharge Instructions: Apply silver alginate to wound bed as instructed Secondary Dressing: ABD Pad, 5x9 Discharge Instructions: Apply over primary dressing as directed. Com pression Wrap: Kerlix Roll 4.5x3.1 (in/yd) Discharge Instructions: Apply Kerlix and Coban compression as directed. Com pression Wrap: Coban Self-Adherent Wrap 4x5 (in/yd) Discharge Instructions: Apply over Kerlix as directed. 1. Would recommend currently that we go ahead and continue with the wound care measures as before and the patient is in agreement with plan. This includes the use of the ABD pads along with silver alginate to any open areas. 2. I would recommend that we continue with the Curlex followed by Coban to secure in place to help with edema  control. 3. The patient does have compression stockings and as soon as everything heals he can get back into those which is good news. 4. The patient's blood pressure was elevated today and I had a discussion with him about contacting his primary care provider in order to discuss options for treatment. I think he needs to try to get this under control if it is this high when he goes for his procedure on Friday the 21st they will send him home. It is also not good for his blood pressure to be as high as it was today. He was around 227/127 which is definitely not healthy. When we rechecked this manually it was 240/127. Nonetheless the patient tells me that he is going to contact his primary care provider and discuss treatment options he is on clonidine currently. He does tell me that he has been moving and there is a lot of stress associated which I completely understand this still  seems to be an exorbitant blood pressure reading even for that situation. We will see patient back for reevaluation in 1 week here in the clinic. If anything worsens or changes patient will contact our office for additional recommendations. Electronic Signature(s) Signed: 10/13/2021 10:08:54 AM By: [name] PA-C Entered By: [name] on 10/13/2021 10:08:54 -------------------------------------------------------------------------------- SuperBill Details Patient Name: Date of Service: VENCIL, LIBERTI 10/13/2021 Medical Record Number: [id number] Patient Account Number: [id number] Date of Birth/Sex: Treating RN: 1960/07/29 (61 y.o. [name], [place] Primary Care Provider: [name] Other Clinician: Referring Provider: Treating Provider/Extender: [name] in Treatment: 7 Diagnosis Coding ICD-10 Codes Code Description I89.0 Lymphedema, not elsewhere classified I87.333 Chronic venous hypertension (idiopathic) with ulcer and inflammation of bilateral lower  extremity L97.822 Non-pressure chronic ulcer of other part of left lower leg with fat layer exposed L97.812 Non-pressure chronic ulcer of other part of right lower leg with fat layer exposed N18.30 Chronic kidney disease, stage 3 unspecified I50.42 Chronic combined systolic (congestive) and diastolic (congestive) heart failure I10 Essential (primary) hypertension 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, 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 Facility Procedures CPT4 Code: [id number] Description: 99214 - WOUND CARE VISIT-LEV 4 EST PT Modifier: Quantity: 1 Physician Procedures : CPT4 Code Description Modifier [id number] - WC PHYS LEVEL 4 - EST PT ICD-10 Diagnosis Description I89.0 Lymphedema, not elsewhere classified I87.333 Chronic venous hypertension (idiopathic) 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

## 2021-10-14 NOTE — Progress Notes (Signed)
TOPRAK, MCCLEES ([id number]) Visit Report for 10/13/2021 Arrival Information Details Patient Name: Date of Service: [name], [name] 10/13/2021 9:15 A M Medical Record Number: [id number] Patient Account Number: [id number] Date of Birth/Sex: Treating RN: 16-Sep-1960 (61 y.o. [name], [name] Primary Care Dervin Vore: [name] Other Clinician: Referring Jillian Warth: Treating Valeree Leidy/Extender: [name] in Treatment: 7 Visit Information History Since Last Visit Added or deleted any medications: No Patient Arrived: Ambulatory Any new allergies or adverse reactions: No Arrival Time: 09:08 Had a fall or experienced change in No Accompanied By: self activities of daily living that may affect Transfer Assistance: None risk of falls: Patient Identification Verified: Yes Signs or symptoms of abuse/neglect since last visito No Secondary Verification Process Completed: Yes Hospitalized since last visit: No Patient Requires Transmission-Based Precautions: No Implantable device outside of the clinic excluding No Patient Has Alerts: No cellular tissue based products placed in the center since last visit: Has Dressing in Place as Prescribed: Yes Has Compression in Place as Prescribed: Yes Pain Present Now: No Electronic Signature(s) Signed: 10/14/2021 4:37:01 PM By: Erenest Blank Entered By: Erenest Blank on 10/13/2021 09:09:25 -------------------------------------------------------------------------------- Clinic Level of Care Assessment Details Patient Name: Date of Service: [name], [name] 10/13/2021 9:15 A M Medical Record Number: [id number] Patient Account Number: [id number] Date of Birth/Sex: Treating RN: 1960/07/24 (61 y.o. [name], [name] Primary Care Lokelani Lutes: [name] Other Clinician: Referring Shemekia Patane: Treating Rik Wadel/Extender: [name] in Treatment: 7 Clinic Level of Care Assessment Items TOOL 4 Quantity  Score X- 1 0 Use when only an EandM is performed on FOLLOW-UP visit ASSESSMENTS - Nursing Assessment / Reassessment X- 1 10 Reassessment of Co-morbidities (includes updates in patient status) X- 1 5 Reassessment of Adherence to Treatment Plan ASSESSMENTS - Wound and Skin A ssessment / Reassessment []  - 0 Simple Wound Assessment / Reassessment - one wound X- 2 5 Complex Wound Assessment / Reassessment - multiple wounds []  - 0 Dermatologic / Skin Assessment (not related to wound area) ASSESSMENTS - Focused Assessment X- 2 5 Circumferential Edema Measurements - multi extremities []  - 0 Nutritional Assessment / Counseling / Intervention []  - 0 Lower Extremity Assessment (monofilament, tuning fork, pulses) []  - 0 Peripheral Arterial Disease Assessment (using hand held doppler) ASSESSMENTS - Ostomy and/or Continence Assessment and Care []  - 0 Incontinence Assessment and Management []  - 0 Ostomy Care Assessment and Management (repouching, etc.) PROCESS - Coordination of Care []  - 0 Simple Patient / Family Education for ongoing care X- 1 20 Complex (extensive) Patient / Family Education for ongoing care X- 1 10 Staff obtains Programmer, systems, Records, T Results / Process Orders est []  - 0 Staff telephones HHA, Nursing Homes / Clarify orders / etc []  - 0 Routine Transfer to another Facility (non-emergent condition) []  - 0 Routine Hospital Admission (non-emergent condition) []  - 0 New Admissions / Biomedical engineer / Ordering NPWT Apligraf, etc. , []  - 0 Emergency Hospital Admission (emergent condition) []  - 0 Simple Discharge Coordination X- 1 15 Complex (extensive) Discharge Coordination PROCESS - Special Needs []  - 0 Pediatric / Minor Patient Management []  - 0 Isolation Patient Management []  - 0 Hearing / Language / Visual special needs []  - 0 Assessment of Community assistance (transportation, D/C planning, etc.) []  - 0 Additional assistance / Altered  mentation []  - 0 Support Surface(s) Assessment (bed, cushion, seat, etc.) INTERVENTIONS - Wound Cleansing / Measurement []  - 0 Simple Wound Cleansing - one wound X- 2 5 Complex Wound Cleansing -  multiple wounds X- 1 5 Wound Imaging (photographs - any number of wounds) []  - 0 Wound Tracing (instead of photographs) []  - 0 Simple Wound Measurement - one wound X- 2 5 Complex Wound Measurement - multiple wounds INTERVENTIONS - Wound Dressings []  - 0 Small Wound Dressing one or multiple wounds X- 2 15 Medium Wound Dressing one or multiple wounds []  - 0 Large Wound Dressing one or multiple wounds []  - 0 Application of Medications - topical X- 1 10 Application of Medications - injection INTERVENTIONS - Miscellaneous []  - 0 External ear exam []  - 0 Specimen Collection (cultures, biopsies, blood, body fluids, etc.) []  - 0 Specimen(s) / Culture(s) sent or taken to Lab for analysis []  - 0 Patient Transfer (multiple staff / Civil Service fast streamer / Similar devices) []  - 0 Simple Staple / Suture removal (25 or less) []  - 0 Complex Staple / Suture removal (26 or more) []  - 0 Hypo / Hyperglycemic Management (close monitor of Blood Glucose) []  - 0 Ankle / Brachial Index (ABI) - do not check if billed separately X- 1 5 Vital Signs Has the patient been seen at the hospital within the last three years: Yes Total Score: 150 Level Of Care: New/Established - Level 4 Electronic Signature(s) Signed: 10/14/2021 4:37:01 PM By: Erenest Blank Entered By: Erenest Blank on 10/13/2021 09:49:36 -------------------------------------------------------------------------------- Encounter Discharge Information Details Patient Name: Date of Service: [name], [name] 10/13/2021 9:15 A M Medical Record Number: [id number] Patient Account Number: [id number] Date of Birth/Sex: Treating RN: 04-19-1960 (61 y.o. [name], [name] Primary Care Aimie Wagman: [name] Other Clinician: Referring Lafe Clerk: Treating  Joslynne Klatt/Extender: [name] in Treatment: 7 Encounter Discharge Information Items Discharge Condition: Stable Ambulatory Status: Ambulatory Discharge Destination: Home Transportation: Private Auto Accompanied By: self Schedule Follow-up Appointment: Yes Clinical Summary of Care: Patient Declined Electronic Signature(s) Signed: 10/13/2021 4:57:29 PM By: Rhae Hammock RN Entered By: Rhae Hammock on 10/13/2021 10:08:19 -------------------------------------------------------------------------------- Lower Extremity Assessment Details Patient Name: Date of Service: [name], [name] 10/13/2021 9:15 A M Medical Record Number: [id number] Patient Account Number: [id number] Date of Birth/Sex: Treating RN: 14-Mar-1961 (61 y.o. [name], [name] Primary Care Reid Regas: [name] Other Clinician: Referring Brandalynn Ofallon: Treating Helem Reesor/Extender: [name] in Treatment: 7 Edema Assessment Assessed: Shirlyn Goltz: No] Patrice Paradise: No] Edema: [Left: Yes] [Right: Yes] Calf Left: Right: Point of Measurement: From Medial Instep 36.5 cm 37.5 cm Ankle Left: Right: Point of Measurement: From Medial Instep 24 cm 25.5 cm Vascular Assessment Pulses: Dorsalis Pedis Palpable: [Left:Yes] [Right:Yes] Posterior Tibial Palpable: [Left:Yes] [Right:Yes] Electronic Signature(s) Signed: 10/13/2021 4:57:29 PM By: Rhae Hammock RN Signed: 10/14/2021 4:37:01 PM By: Erenest Blank Entered By: Erenest Blank on 10/13/2021 09:27:06 -------------------------------------------------------------------------------- Multi-Disciplinary Care Plan Details Patient Name: Date of Service: [name], [name] 10/13/2021 9:15 A M Medical Record Number: [id number] Patient Account Number: [id number] Date of Birth/Sex: Treating RN: October 16, 1960 (61 y.o. [name], [name] Primary Care Gerlean Cid: [name] Other Clinician: Referring Cheng Dec: Treating Rey Dansby/Extender:  [name] in Treatment: 7 Active Inactive Electronic Signature(s) Signed: 10/13/2021 4:57:29 PM By: Rhae Hammock RN Entered By: Rhae Hammock on 10/13/2021 09:37:15 -------------------------------------------------------------------------------- Pain Assessment Details Patient Name: Date of Service: [name], [name] 10/13/2021 9:15 A M Medical Record Number: [id number] Patient Account Number: [id number] Date of Birth/Sex: Treating RN: 08/03/1960 (61 y.o. Erie Noe Primary Care Jamicia Haaland: [name] Other Clinician: Referring Eldora Napp: Treating Jaqualyn Juday/Extender: [name] in Treatment: 7 Active Problems Location of Pain Severity  and Description of Pain Patient Has Paino No Site Locations Pain Management and Medication Current Pain Management: Electronic Signature(s) Signed: 10/13/2021 4:57:29 PM By: Fonnie Mu RN Signed: 10/14/2021 4:37:01 PM By: Thayer Dallas Entered By: Thayer Dallas on 10/13/2021 09:24:57 -------------------------------------------------------------------------------- Patient/Caregiver Education Details Patient Name: Date of Service: [name] 7/12/2023andnbsp9:15 A M Medical Record Number: [id number] Patient Account Number: [id number] Date of Birth/Gender: Treating RN: Sep 08, 1960 (61 y.o. [name], [name] Primary Care Physician: [name] Other Clinician: Referring Physician: Treating Physician/Extender: [name] in Treatment: 7 Education Assessment Education Provided To: Patient Education Topics Provided Wound/Skin Impairment: Methods: Explain/Verbal Responses: Reinforcements needed, State content correctly Electronic Signature(s) Signed: 10/13/2021 4:57:29 PM By: Fonnie Mu RN Entered By: Fonnie Mu on 10/13/2021 09:37:32 -------------------------------------------------------------------------------- Wound  Assessment Details Patient Name: Date of Service: [name], [name] 10/13/2021 9:15 A M Medical Record Number: [id number] Patient Account Number: [id number] Date of Birth/Sex: Treating RN: 1961-02-24 (61 y.o. [name], [name] Primary Care Sandie Swayze: [name] Other Clinician: Referring Hakeem Frazzini: Treating Elsia Lasota/Extender: [name] in Treatment: 7 Wound Status Wound Number: 5 Primary Etiology: Venous Leg Ulcer Wound Location: Right, Circumferential Lower Leg Wound Status: Open Wounding Event: Gradually Appeared Comorbid History: Congestive Heart Failure, Hypertension Date Acquired: 07/03/2021 Weeks Of Treatment: 7 Clustered Wound: No Photos Wound Measurements Length: (cm) 22 Width: (cm) 25 Depth: (cm) 0.1 Area: (cm) 431.969 Volume: (cm) 43.197 % Reduction in Area: 53.3% % Reduction in Volume: 53.3% Epithelialization: None Tunneling: No Undermining: No Wound Description Classification: Full Thickness With Exposed Support Structures Wound Margin: Distinct, outline attached Exudate Amount: Large Exudate Type: Serosanguineous Exudate Color: red, brown Foul Odor After Cleansing: No Slough/Fibrino Yes Wound Bed Granulation Amount: Medium (34-66%) Exposed Structure Granulation Quality: Red, Pink Fascia Exposed: No Necrotic Amount: Medium (34-66%) Fat Layer (Subcutaneous Tissue) Exposed: No Necrotic Quality: Adherent Slough Tendon Exposed: No Muscle Exposed: No Joint Exposed: No Bone Exposed: No Treatment Notes Wound #5 (Lower Leg) Wound Laterality: Right, Circumferential Cleanser Soap and Water Discharge Instruction: May shower and wash wound with dial antibacterial soap and water prior to dressing change. Peri-Wound Care Triamcinolone 15 (g) Discharge Instruction: Use triamcinolone 15 (g) as directed Zinc Oxide Ointment 30g tube Discharge Instruction: Apply Zinc Oxide to periwound with each dressing change Sween Lotion  (Moisturizing lotion) Discharge Instruction: Apply moisturizing lotion as directed Topical Primary Dressing KerraCel Ag Gelling Fiber Dressing, 4x5 in (silver alginate) Discharge Instruction: Apply silver alginate to wound bed as instructed Secondary Dressing ABD Pad, 5x9 Discharge Instruction: Apply over primary dressing as directed. Secured With Compression Wrap Kerlix Roll 4.5x3.1 (in/yd) Discharge Instruction: Apply Kerlix and Coban compression as directed. Coban Self-Adherent Wrap 4x5 (in/yd) Discharge Instruction: Apply over Kerlix as directed. Compression Stockings Add-Ons Electronic Signature(s) Signed: 10/13/2021 4:57:29 PM By: Fonnie Mu RN Entered By: Fonnie Mu on 10/13/2021 09:33:45 -------------------------------------------------------------------------------- Wound Assessment Details Patient Name: Date of Service: [name], [name] 10/13/2021 9:15 A M Medical Record Number: [id number] Patient Account Number: [id number] Date of Birth/Sex: Treating RN: 01-10-1961 (61 y.o. [name], [name] Primary Care Jun Osment: [name] Other Clinician: Referring Ryler Laskowski: Treating Purvis Sidle/Extender: [name] in Treatment: 7 Wound Status Wound Number: 6 Primary Etiology: Venous Leg Ulcer Wound Location: Left, Circumferential Lower Leg Wound Status: Open Wounding Event: Gradually Appeared Comorbid History: Congestive Heart Failure, Hypertension Date Acquired: 07/03/2021 Weeks Of Treatment: 7 Clustered Wound: No Photos Wound Measurements Length: (cm) 20 Width: (cm) 25 Depth: (cm) 0.1 Area: (cm) 392.699 Volume: (cm) 39.27 %  Reduction in Area: 49.6% % Reduction in Volume: 49.6% Epithelialization: None Tunneling: No Undermining: No Wound Description Classification: Full Thickness With Exposed Support Struct Wound Margin: Distinct, outline attached Exudate Amount: Large Exudate Type: Serosanguineous Exudate Color: red,  brown ures Wound Bed Granulation Amount: Small (1-33%) Exposed Structure Granulation Quality: Red Fascia Exposed: No Necrotic Amount: Large (67-100%) Fat Layer (Subcutaneous Tissue) Exposed: No Necrotic Quality: Eschar, Adherent Slough Tendon Exposed: No Muscle Exposed: No Joint Exposed: No Bone Exposed: No Treatment Notes Wound #6 (Lower Leg) Wound Laterality: Left, Circumferential Cleanser Soap and Water Discharge Instruction: May shower and wash wound with dial antibacterial soap and water prior to dressing change. Peri-Wound Care Triamcinolone 15 (g) Discharge Instruction: Use triamcinolone 15 (g) as directed Zinc Oxide Ointment 30g tube Discharge Instruction: Apply Zinc Oxide to periwound with each dressing change Sween Lotion (Moisturizing lotion) Discharge Instruction: Apply moisturizing lotion as directed Topical Primary Dressing KerraCel Ag Gelling Fiber Dressing, 4x5 in (silver alginate) Discharge Instruction: Apply silver alginate to wound bed as instructed Secondary Dressing ABD Pad, 5x9 Discharge Instruction: Apply over primary dressing as directed. Secured With Compression Wrap Kerlix Roll 4.5x3.1 (in/yd) Discharge Instruction: Apply Kerlix and Coban compression as directed. Coban Self-Adherent Wrap 4x5 (in/yd) Discharge Instruction: Apply over Kerlix as directed. Compression Stockings Add-Ons Electronic Signature(s) Signed: 10/13/2021 4:57:29 PM By: Fonnie Mu RN Entered By: Fonnie Mu on 10/13/2021 09:34:36 -------------------------------------------------------------------------------- Vitals Details Patient Name: Date of Service: [name], [name] 10/13/2021 9:15 A M Medical Record Number: [id number] Patient Account Number: [id number] Date of Birth/Sex: Treating RN: 1960-12-30 (61 y.o. [name], [name] Primary Care Juliann Olesky: [name] Other Clinician: Referring Winefred Hillesheim: Treating Connie Hilgert/Extender: [name] in Treatment: 7 Vital Signs Time Taken: 09:09 Temperature (F): 98.5 Height (in): 69 Respiratory Rate (breaths/min): 17 Weight (lbs): 215 Blood Pressure (mmHg): 227/127 Body Mass Index (BMI): 31.7 Reference Range: 80 - 120 mg / dl Notes re-checked manual bp and it was 240/127; pt. states he feels fine. PA Allen Derry made aware. Electronic Signature(s) Signed: 10/14/2021 4:37:01 PM By: Thayer Dallas Entered By: Thayer Dallas on 10/13/2021 09:24:47

## 2021-10-18 ENCOUNTER — Ambulatory Visit (HOSPITAL_BASED_OUTPATIENT_CLINIC_OR_DEPARTMENT_OTHER): Payer: Medicare Other | Attending: Cardiology | Admitting: Cardiology

## 2021-10-18 DIAGNOSIS — G4733 Obstructive sleep apnea (adult) (pediatric): Secondary | ICD-10-CM | POA: Insufficient documentation

## 2021-10-18 DIAGNOSIS — I10 Essential (primary) hypertension: Secondary | ICD-10-CM | POA: Insufficient documentation

## 2021-10-19 NOTE — Procedures (Signed)
   Patient Name: Shawn, Small Date: 10/18/2021 Gender: Male D.O.B: May 30, 1960 Age (years): 1 Referring Provider: Armanda Magic MD, ABSM Height (inches): 69 Interpreting Physician: Armanda Magic MD, ABSM Weight (lbs): 213 RPSGT: Ulyess Mort BMI: 31 MRN: [id number] Neck Size: 16.00  CLINICAL INFORMATION The patient is referred for a BiPAP titration to treat sleep apnea.  SLEEP STUDY TECHNIQUE As per the AASM Manual for the Scoring of Sleep and Associated Events v2.3 (April 2016) with a hypopnea requiring 4% desaturations.  The channels recorded and monitored were frontal, central and occipital EEG, electrooculogram (EOG), submentalis EMG (chin), nasal and oral airflow, thoracic and abdominal wall motion, anterior tibialis EMG, snore microphone, electrocardiogram, and pulse oximetry. Bilevel positive airway pressure (BPAP) was initiated at the beginning of the study and titrated to treat sleep-disordered breathing.  MEDICATIONS Medications self-administered by patient taken the night of the study : N/A  RESPIRATORY PARAMETERS Optimal IPAP Pressure (cm): 25  AHI at Optimal Pressure (/hr) 0 Optimal EPAP Pressure (cm):21  Overall Minimal O2 (%):79.0  Minimal O2 at Optimal Pressure (%): 92.0  SLEEP ARCHITECTURE Start Time:10:47:26 PM  Stop Time:5:20:27 AM  Total Time (min):393  Total Sleep Time (min):294 Sleep Latency (min):3.4  Sleep Efficiency (%):74.8%  REM Latency (min):101.0  WASO (min):95.6 Stage N1 (%): 16.3%  Stage N2 (%): 71.3%  Stage N3 (%): 0.0%  Stage R (%):12.4 Supine (%):100.00  Arousal Index (/hr):37.6   CARDIAC DATA The 2 lead EKG demonstrated Atrial Fibrillation. The mean heart rate was 70.5 beats per minute.  LEG MOVEMENT DATA The total Periodic Limb Movements of Sleep (PLMS) were 0. The PLMS index was 0.0. A PLMS index of <15 is considered normal in adults.  IMPRESSIONS - An optimal CPAP pressure could not be selected due to ongoing  respiratory events. - Central sleep apnea was not noted during this titration (CAI = 1.4/h). - Severe oxygen desaturations were observed during this titration (min O2 = 79.0%). - The patient snored with moderate snoring volume. - 2-lead EKG demonstrated: Atrial Fibrillation - Clinically significant periodic limb movements were not noted during this study. Arousals associated with PLMs were rare.  DIAGNOSIS - Obstructive Sleep Apnea (G47.33) - Atrial Fibrillation  RECOMMENDATIONS - Recommend full night BiPAP titration  - Avoid alcohol, sedatives and other CNS depressants that may worsen sleep apnea and disrupt normal sleep architecture. - Sleep hygiene should be reviewed to assess factors that may improve sleep quality. - Weight management and regular exercise should be initiated or continued.  [Electronically signed] 10/25/2021 04:56 PM  Armanda Magic MD, ABSM Diplomate, American Board of Sleep Medicine

## 2021-10-20 ENCOUNTER — Encounter (HOSPITAL_BASED_OUTPATIENT_CLINIC_OR_DEPARTMENT_OTHER): Payer: Medicare Other | Admitting: Physician Assistant

## 2021-10-20 DIAGNOSIS — N183 Chronic kidney disease, stage 3 unspecified: Secondary | ICD-10-CM | POA: Diagnosis not present

## 2021-10-20 DIAGNOSIS — I350 Nonrheumatic aortic (valve) stenosis: Secondary | ICD-10-CM | POA: Diagnosis not present

## 2021-10-20 DIAGNOSIS — I130 Hypertensive heart and chronic kidney disease with heart failure and stage 1 through stage 4 chronic kidney disease, or unspecified chronic kidney disease: Secondary | ICD-10-CM | POA: Diagnosis not present

## 2021-10-20 DIAGNOSIS — I5042 Chronic combined systolic (congestive) and diastolic (congestive) heart failure: Secondary | ICD-10-CM | POA: Diagnosis not present

## 2021-10-20 DIAGNOSIS — I890 Lymphedema, not elsewhere classified: Secondary | ICD-10-CM | POA: Diagnosis not present

## 2021-10-20 DIAGNOSIS — L97812 Non-pressure chronic ulcer of other part of right lower leg with fat layer exposed: Secondary | ICD-10-CM | POA: Diagnosis not present

## 2021-10-20 DIAGNOSIS — I70248 Atherosclerosis of native arteries of left leg with ulceration of other part of lower left leg: Secondary | ICD-10-CM | POA: Diagnosis not present

## 2021-10-20 DIAGNOSIS — I872 Venous insufficiency (chronic) (peripheral): Secondary | ICD-10-CM | POA: Diagnosis not present

## 2021-10-20 DIAGNOSIS — L97822 Non-pressure chronic ulcer of other part of left lower leg with fat layer exposed: Secondary | ICD-10-CM | POA: Diagnosis not present

## 2021-10-20 DIAGNOSIS — I87333 Chronic venous hypertension (idiopathic) with ulcer and inflammation of bilateral lower extremity: Secondary | ICD-10-CM | POA: Diagnosis not present

## 2021-10-20 NOTE — Progress Notes (Addendum)
[name], [name] ([id number]) Visit Report for 10/20/2021 Arrival Information Details Patient Name: Date of Service: [name], [name] 10/20/2021 10:45 A M Medical Record Number: [id number] Patient Account Number: [id number] Date of Birth/Sex: Treating RN: Nov 08, 1960 (61 y.o. [name], [name] Primary Care Katniss Weedman: [name] Other Clinician: Referring Garvey Westcott: Treating Diontae Route/Extender: [name] in Treatment: 8 Visit Information History Since Last Visit Added or deleted any medications: No Patient Arrived: Ambulatory Any new allergies or adverse reactions: No Arrival Time: 11:15 Had a fall or experienced change in No Accompanied By: self activities of daily living that may affect Transfer Assistance: None risk of falls: Patient Identification Verified: Yes Signs or symptoms of abuse/neglect since last visito No Secondary Verification Process Completed: Yes Hospitalized since last visit: No Patient Requires Transmission-Based Precautions: No Implantable device outside of the clinic excluding No Patient Has Alerts: No cellular tissue based products placed in the center since last visit: Has Dressing in Place as Prescribed: Yes Has Compression in Place as Prescribed: Yes Pain Present Now: No Electronic Signature(s) Signed: 10/20/2021 3:44:56 PM By: Thayer Dallas Entered By: Thayer Dallas on 10/20/2021 11:18:29 -------------------------------------------------------------------------------- Clinic Level of Care Assessment Details Patient Name: Date of Service: [name], [name] 10/20/2021 10:45 A M Medical Record Number: [id number] Patient Account Number: [id number] Date of Birth/Sex: Treating RN: 1960-09-15 (61 y.o. Harlon Flor, Millard.Loa Primary Care Krissia Schreier: [name] Other Clinician: Referring Kaiea Esselman: Treating Vanden Fawaz/Extender: [name] in Treatment: 8 Clinic Level of Care Assessment Items TOOL 4 Quantity Score X-  1 0 Use when only an EandM is performed on FOLLOW-UP visit ASSESSMENTS - Nursing Assessment / Reassessment X- 1 10 Reassessment of Co-morbidities (includes updates in patient status) X- 1 5 Reassessment of Adherence to Treatment Plan ASSESSMENTS - Wound and Skin A ssessment / Reassessment []  - 0 Simple Wound Assessment / Reassessment - one wound X- 2 5 Complex Wound Assessment / Reassessment - multiple wounds X- 1 10 Dermatologic / Skin Assessment (not related to wound area) ASSESSMENTS - Focused Assessment X- 1 5 Circumferential Edema Measurements - multi extremities []  - 0 Nutritional Assessment / Counseling / Intervention []  - 0 Lower Extremity Assessment (monofilament, tuning fork, pulses) []  - 0 Peripheral Arterial Disease Assessment (using hand held doppler) ASSESSMENTS - Ostomy and/or Continence Assessment and Care []  - 0 Incontinence Assessment and Management []  - 0 Ostomy Care Assessment and Management (repouching, etc.) PROCESS - Coordination of Care []  - 0 Simple Patient / Family Education for ongoing care X- 1 20 Complex (extensive) Patient / Family Education for ongoing care X- 1 10 Staff obtains , Records, T Results / Process Orders est []  - 0 Staff telephones HHA, Nursing Homes / Clarify orders / etc []  - 0 Routine Transfer to another Facility (non-emergent condition) []  - 0 Routine Hospital Admission (non-emergent condition) []  - 0 New Admissions / / Ordering NPWT Apligraf, etc. , []  - 0 Emergency Hospital Admission (emergent condition) []  - 0 Simple Discharge Coordination X- 1 15 Complex (extensive) Discharge Coordination PROCESS - Special Needs []  - 0 Pediatric / Minor Patient Management []  - 0 Isolation Patient Management []  - 0 Hearing / Language / Visual special needs []  - 0 Assessment of Community assistance (transportation, D/C planning, etc.) []  - 0 Additional assistance / Altered mentation []  -  0 Support Surface(s) Assessment (bed, cushion, seat, etc.) INTERVENTIONS - Wound Cleansing / Measurement []  - 0 Simple Wound Cleansing - one wound X- 2 5 Complex Wound Cleansing -  multiple wounds X- 1 5 Wound Imaging (photographs - any number of wounds) []  - 0 Wound Tracing (instead of photographs) []  - 0 Simple Wound Measurement - one wound X- 2 5 Complex Wound Measurement - multiple wounds INTERVENTIONS - Wound Dressings []  - 0 Small Wound Dressing one or multiple wounds []  - 0 Medium Wound Dressing one or multiple wounds X- 2 20 Large Wound Dressing one or multiple wounds []  - 0 Application of Medications - topical []  - 0 Application of Medications - injection INTERVENTIONS - Miscellaneous []  - 0 External ear exam []  - 0 Specimen Collection (cultures, biopsies, blood, body fluids, etc.) []  - 0 Specimen(s) / Culture(s) sent or taken to Lab for analysis []  - 0 Patient Transfer (multiple staff / / Similar devices) []  - 0 Simple Staple / Suture removal (25 or less) []  - 0 Complex Staple / Suture removal (26 or more) []  - 0 Hypo / Hyperglycemic Management (close monitor of Blood Glucose) []  - 0 Ankle / Brachial Index (ABI) - do not check if billed separately X- 1 5 Vital Signs Has the patient been seen at the hospital within the last three years: Yes Total Score: 155 Level Of Care: New/Established - Level 4 Electronic Signature(s) Signed: 10/30/2021 12:37:29 PM By: RN, BSN Entered By: on 10/30/2021 10:49:04 -------------------------------------------------------------------------------- Lower Extremity Assessment Details Patient Name: Date of Service: [name], [name] 10/20/2021 10:45 A M Medical Record Number: Patient Account Number: Date of Birth/Sex: Treating RN: 1960-05-27 (61 y.o. , [name] Primary Care Mustafa Potts: Other Clinician: Referring Serai Tukes: Treating  Kanchan Gal/Extender: in Treatment: 8 Edema Assessment Assessed: : Yes] 11/01/2021: Yes] Edema: [Left: Yes] [Right: Yes] Calf Left: Right: Point of Measurement: From Medial Instep 36.5 cm 37.5 cm Ankle Left: Right: Point of Measurement: From Medial Instep 24 cm 25.5 cm Vascular Assessment Pulses: Dorsalis Pedis Palpable: [Left:Yes] [Right:Yes] Posterior Tibial Palpable: [Left:Yes] [Right:Yes] Electronic Signature(s) Signed: 10/20/2021 3:34:17 PM By: Shawn Stall RN Entered By: 11/01/2021 on 10/20/2021 11:47:45 -------------------------------------------------------------------------------- Multi-Disciplinary Care Plan Details Patient Name: Date of Service: [name], [name] 10/20/2021 10:45 A M Medical Record Number: [id number] Patient Account Number: 01/04/1961 Date of Birth/Sex: Treating RN: 07-Apr-1960 (61 y.o. [name], [name] Primary Care Tifanny Dollens: [name] Other Clinician: Referring Clinton Dragone: Treating Crystalmarie Yasin/Extender: [name] in Treatment: 8 Active Inactive Electronic Signature(s) Signed: 10/20/2021 3:34:17 PM By: 10/22/2021 RN Entered By: Fonnie Mu on 10/20/2021 11:53:46 -------------------------------------------------------------------------------- Pain Assessment Details Patient Name: Date of Service: [name], [name] 10/20/2021 10:45 A M Medical Record Number: 10/22/2021 Patient Account Number: [id number] Date of Birth/Sex: Treating RN: [birth year] (61 y.o. 76, [name] Primary Care Kynslei Art: [name] Other Clinician: Referring Adryan Shin: Treating Kamyrah Feeser/Extender: [name] in Treatment: 8 Active Problems Location of Pain Severity and Description of Pain Patient Has Paino No Site Locations Pain Management and Medication Current Pain Management: Electronic Signature(s) Signed: 10/20/2021 3:34:17 PM By: 10/22/2021  RN Entered By: Fonnie Mu on 10/20/2021 11:47:35 -------------------------------------------------------------------------------- Patient/Caregiver Education Details Patient Name: Date of Service: 10/22/2021 7/19/2023andnbsp10:45 A M Medical Record Number: 10/22/2021 Patient Account Number: [id number] Date of Birth/Gender: Treating RN: 01-Jul-1960 (61 y.o. 77 Primary Care Physician: [name] Other Clinician: Referring Physician: Treating Physician/Extender: [name] in Treatment: 8 Education Assessment Education Provided To: Patient Education Topics Provided Wound/Skin Impairment: Methods: Explain/Verbal Responses: Reinforcements needed, State content correctly  Electronic Signature(s) Signed: 10/20/2021 3:34:17 PM By: Fonnie Mu RN Entered By: Fonnie Mu on 10/20/2021 11:53:57 -------------------------------------------------------------------------------- Wound Assessment Details Patient Name: Date of Service: [name], [name] 10/20/2021 10:45 A M Medical Record Number: [id number] Patient Account Number: [id number] Date of Birth/Sex: Treating RN: April 04, 1961 (61 y.o. [name], [name] Primary Care Brinsley Wence: [name] Other Clinician: Referring De Jaworski: Treating Hiawatha Merriott/Extender: [name] in Treatment: 8 Wound Status Wound Number: 5 Primary Etiology: Venous Leg Ulcer Wound Location: Right, Circumferential Lower Leg Wound Status: Open Wounding Event: Gradually Appeared Comorbid History: Congestive Heart Failure, Hypertension Date Acquired: 07/03/2021 Weeks Of Treatment: 8 Clustered Wound: No Photos Wound Measurements Length: (cm) 22 Width: (cm) 25 Depth: (cm) 0.1 Area: (cm) 431.969 Volume: (cm) 43.197 % Reduction in Area: 53.3% % Reduction in Volume: 53.3% Epithelialization: None Tunneling: No Undermining: No Wound Description Classification: Full  Thickness With Exposed Support Structures Wound Margin: Distinct, outline attached Exudate Amount: Large Exudate Type: Serosanguineous Exudate Color: red, brown Foul Odor After Cleansing: No Slough/Fibrino Yes Wound Bed Granulation Amount: Medium (34-66%) Exposed Structure Granulation Quality: Red, Pink Fascia Exposed: No Necrotic Amount: Medium (34-66%) Fat Layer (Subcutaneous Tissue) Exposed: No Necrotic Quality: Adherent Slough Tendon Exposed: No Muscle Exposed: No Joint Exposed: No Bone Exposed: No Electronic Signature(s) Signed: 10/20/2021 3:34:17 PM By: Fonnie Mu RN Signed: 10/20/2021 4:20:13 PM By: Shawn Stall RN, BSN Entered By: Shawn Stall on 10/20/2021 11:49:03 -------------------------------------------------------------------------------- Wound Assessment Details Patient Name: Date of Service: [name], [name] 10/20/2021 10:45 A M Medical Record Number: [id number] Patient Account Number: [id number] Date of Birth/Sex: Treating RN: 1961-03-10 (61 y.o. [name], [name] Primary Care Jourden Delmont: [name] Other Clinician: Referring Chelan Heringer: Treating Krisanne Lich/Extender: [name] in Treatment: 8 Wound Status Wound Number: 6 Primary Etiology: Venous Leg Ulcer Wound Location: Left, Circumferential Lower Leg Wound Status: Open Wounding Event: Gradually Appeared Comorbid History: Congestive Heart Failure, Hypertension Date Acquired: 07/03/2021 Weeks Of Treatment: 8 Clustered Wound: No Photos Wound Measurements Length: (cm) 20 Width: (cm) 25 Depth: (cm) 0.1 Area: (cm) 392.699 Volume: (cm) 39.27 % Reduction in Area: 49.6% % Reduction in Volume: 49.6% Epithelialization: None Tunneling: No Undermining: No Wound Description Classification: Full Thickness With Exposed Support Struct Wound Margin: Distinct, outline attached Exudate Amount: Large Exudate Type: Serosanguineous Exudate Color: red, brown ures Wound  Bed Granulation Amount: Small (1-33%) Exposed Structure Granulation Quality: Red Fascia Exposed: No Necrotic Amount: Large (67-100%) Fat Layer (Subcutaneous Tissue) Exposed: No Necrotic Quality: Eschar, Adherent Slough Tendon Exposed: No Muscle Exposed: No Joint Exposed: No Bone Exposed: No Electronic Signature(s) Signed: 10/20/2021 3:34:17 PM By: Fonnie Mu RN Signed: 10/20/2021 4:20:13 PM By: Shawn Stall RN, BSN Signed: 10/20/2021 4:20:13 PM By: Shawn Stall RN, BSN Entered By: Shawn Stall on 10/20/2021 11:49:20 -------------------------------------------------------------------------------- Vitals Details Patient Name: Date of Service: [name], [name] 10/20/2021 10:45 A M Medical Record Number: [id number] Patient Account Number: [id number] Date of Birth/Sex: Treating RN: 1960/07/20 (61 y.o. [name], [name] Primary Care Rhett Najera: [name] Other Clinician: Referring Zong Mcquarrie: Treating Monnie Gudgel/Extender: [name] in Treatment: 8 Vital Signs Time Taken: 11:47 Temperature (F): 98.7 Height (in): 69 Pulse (bpm): 72 Weight (lbs): 215 Respiratory Rate (breaths/min): 17 Body Mass Index (BMI): 31.7 Blood Pressure (mmHg): 220/135 Reference Range: 80 - 120 mg / dl Notes manual 366/440 Electronic Signature(s) Signed: 10/20/2021 3:34:17 PM By: Fonnie Mu RN Entered By: Fonnie Mu on 10/20/2021 11:47:29

## 2021-10-20 NOTE — Progress Notes (Addendum)
[name], [name] ([id number]) Visit Report for 10/20/2021 Chief Complaint Document Details Patient Name: Date of Service: HARSHAAN, Small 10/20/2021 10:45 A M Medical Record Number: [id number] Patient Account Number: [id number] Date of Birth/Sex: Treating RN: 02/17/1961 (61 y.o. [name], [name] Primary Care Provider: [name] Other Clinician: Referring Provider: Treating Provider/Extender: [name] in Treatment: 8 Information Obtained from: Patient Chief Complaint Bilateral Leg Ulcers Electronic Signature(s) Signed: 10/20/2021 9:50:36 AM By: [name] PA-C Entered By: [name] on 10/20/2021 09:50:36 -------------------------------------------------------------------------------- HPI Details Patient Name: Date of Service: [name], [name] 10/20/2021 10:45 A M Medical Record Number: [id number] Patient Account Number: [id number] Date of Birth/Sex: Treating RN: 10-Feb-1961 (61 y.o. [name], [name] Primary Care Provider: [name] Other Clinician: Referring Provider: Treating Provider/Extender: [name] in Treatment: 8 History of Present Illness HPI Description: 02/24/2020 on evaluation today patient presents with obvious chronic lymphedema of the bilateral lower extremities. Fortunately there is no signs of active infection at this time which is great news. With that being said the patient does have a recent treatment with doxycycline 100 mg for 10 days which he has completed. He does have a history of chronic kidney disease stage III as documented in his chart. He also has chronic venous insufficiency with lower extremity lymphedema noted. He has congestive heart failure and hypertension. He tells me currently that he was unaware of the kidney disease before he mentioned this today. He also tells me that his primary care provider had told him to quit taking showers based on what was going on with his legs  at this time. Fortunately there is no signs of active infection at this time which is great news. No fevers, chills, nausea, vomiting, or diarrhea. 03/04/2020 on evaluation today patient actually appears to be doing much better in regard to his legs. There is really not much draining a lot of the dry skin is starting to loosen up amount of time to get as much of this off today as possible 03/11/2020 upon evaluation today patient appears to be doing well currently in regard to his legs. He still has a lot of dry skin some which I was able to get off today but overall I think that the biggest thing he needs right now is to be able to wash his legs in the shower to loosen some of this up which I think will then come off much more effectively and quickly without as much of an issue. Fortunately there is no evidence of active infection at this time. No fevers, chills, nausea, vomiting, or diarrhea. Readmission: 06/05/2020 upon evaluation today patient appears to be doing worse in regard to his bilateral lower extremities. Unfortunately he has had some issues here with his leg started to swell again he tells me he was not able to wear the compression stockings that he got from elastic therapy. He states when he called the leg he said that the measurements did not seem right and subsequently just sent him something she thought would work. Either way he tells me they were very tight and very uncomfortable he just was not able to stick with it. Fortunately there is no signs of active infection at this time. No fevers, chills, nausea, vomiting, or diarrhea. 06/12/2020 on evaluation today patient actually appears to be doing well in regard to his legs. He has a lot of dry skin but nothing that appears to be open at this point. He did not  get the Velcro compression wraps but does have the pull up compression stockings which if he wears those can definitely be sufficient here. Readmission: 08-25-2021 upon evaluation  today patient appears to be doing well with regard to his legs all things considered. With that being said I do not see anything that actively looks like it is open or draining but I definitely see spots where he had blistering and obvious signs of drainage. Subsequently he is supposed to be having open heart surgery to replace a valve that sounds like but they are not able to do that simply due to the fact that right now he is having more issues currently with his legs and they will not do anything until that is resolved. Fortunately I do not see any evidence of active infection locally or systemically which is great news. No fevers, chills, nausea, vomiting, or diarrhea. The patient does have a history noted previously of lymphedema, chronic venous hypertension, chronic kidney disease stage III, congestive heart failure, and hypertension. 09-01-2021 upon evaluation today patient actually appears to be healed I do not see anything open at this time which is great news. Fortunately he is doing excellent and I think they were on the right track. With that being said there were a couple areas where he was so dry that the wrap actually stuck to his leg and this has caused some issues here with a couple small openings remaining but I think by next week these will be sealed up. Overall I think that he needs some lotion twice a day and Ace wraps are probably to be the best way to go to help with compression for now. He does have the vascular appointment upcoming in a couple of days. 6/7; patient comes in today with quite a deterioration from last week. Coupled with this is that he had his arterial studies which were really not very good on the right his ABI was 0.92 and on the left 1.09 but TBI was 0.52 on the right and 0.36 on the left he had monophasic waveforms almost diffusely on the right with an estimated 50 to 74% stenosis of the distal SFA on the left he had similar findings. 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 We are working on getting him a vascular evaluation./Consultation Also looking through his chart he has a lot of other medical problems including severe aortic stenosis and he has been referred to cardiac surgery. He has atrial fibrillation on Eliquis 09-15-2021 upon evaluation today patient appears to be doing better than it sounds like he was last week. Fortunately I do not see any evidence of active infection locally or systemically which is great news. No fevers, chills, nausea, vomiting, or diarrhea. He does have an appointment with vascular tomorrow. 09-22-2021 upon evaluation today patient appears to be doing well with regard to his legs all things considered. I did review his note and it appears that he has significant peripheral vascular disease bilaterally in the lower extremities. Subsequently this seems to be even a femoral and iliac arteries. Subsequently I do believe that the arteriogram is probably going to be a good way to go as far as trying to improve his blood flow as noted in the record from Dr. Deitra Mayo on 09-16-2021. 09-29-2021 upon evaluation today patient appears to be doing decently well today in regard  to his wounds on the legs. Things are actually showing signs of good improvement and I am very pleased in that regard. I do not see any signs of infection at this time which is great news and in fact everything is pretty dry today. He does have his arteriogram with Dr. Doren Custard on Friday. With that being said once that is complete I really think things will be significantly improved and hopefully he should be able to not only feel better but also continue with his own compression socks. 10-06-2021 upon evaluation today patient appears to be doing well with regard to his legs in fact there is very  little open at this point. Fortunately I do not see any signs of active infection which is great news. No fevers, chills, nausea, vomiting, or diarrhea. 10-13-2021 upon evaluation today patient's legs actually appear to be doing decently well. I am really seeing not much in the way of drainage at this point. Fortunately I think that he is on the right track and I believe that we are very close to complete resolution and discharge and at this point I do think that he is tolerating the compression wraps without complication. He has his appointment with vascular coming up on the 21st of this month which is just a week and a half away. 10-20-2021 upon evaluation today patient appears to be doing well currently in regard to his legs. There is very little open or draining at this point he actually has his procedure with vascular on Friday. Hopefully he will have much better blood flow following this procedure. Electronic Signature(s) Signed: 10/20/2021 2:42:51 PM By: [name] PA-C Entered By: [name] on 10/20/2021 14:42:51 -------------------------------------------------------------------------------- Physical Exam Details Patient Name: Date of Service: [name], [name] 10/20/2021 10:45 A M Medical Record Number: [id number] Patient Account Number: [id number] Date of Birth/Sex: Treating RN: 1960/09/07 (61 y.o. [name], [name] Primary Care Provider: [name] Other Clinician: Referring Provider: Treating Provider/Extender: [name] in Treatment: 8 Constitutional Well-nourished and well-hydrated in no acute distress. Respiratory normal breathing without difficulty. Psychiatric this patient is able to make decisions and demonstrates good insight into disease process. Alert and Oriented x 3. pleasant and cooperative. Notes Upon inspection patient's wounds again really there is not much open on his legs he is doing quite well I think if we can get his  blood flow under good control here then we can recommend some stronger compression to get things moving in a much better direction and the patient is in agreement with plan and is happy to hear this. Electronic Signature(s) Signed: 10/20/2021 2:43:15 PM By: [name] PA-C Entered By: [name] on 10/20/2021 14:43:15 -------------------------------------------------------------------------------- Physician Orders Details Patient Name: Date of Service: PANAGIOTIS, MACINNES 10/20/2021 10:45 A M Medical Record Number: [id number] Patient Account Number: [id number] Date of Birth/Sex: Treating RN: Mar 08, 1961 (61 y.o. [name], [name] Primary Care Provider: [name] Other Clinician: Referring Provider: Treating Provider/Extender: [name] in Treatment: 8 Verbal / Phone Orders: No Diagnosis Coding ICD-10 Coding Code Description I89.0 Lymphedema, not elsewhere classified I87.333 Chronic venous hypertension (idiopathic) with ulcer and inflammation of bilateral lower extremity L97.822 Non-pressure chronic ulcer of other part of left lower leg with fat layer exposed L97.812 Non-pressure chronic ulcer of other part of right lower leg with fat layer exposed N18.30 Chronic kidney disease, stage 3 unspecified I50.42 Chronic combined systolic (congestive) and diastolic (congestive) heart failure I10 Essential (primary) hypertension  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 - Wed. 10/27/21 @1130  w/ , [place] (Dr. Allen Derry coverin) and Leanord Hawking Maryruth Bun coverin) Room # 9 Return A Other: - Arteriography 10/22/21 w/ Dr. 10/24/21 Bathing/ Shower/ Hygiene May shower with protection but do not get wound dressing(s) wet. Edema Control - Lymphedema / SCD / Other Elevate legs to the  level of the heart or above for 30 minutes daily and/or when sitting, a frequency of: Avoid standing for long periods of time. Wound Treatment Wound #5 - Lower Leg Wound Laterality: Right, Circumferential Cleanser: Soap and Water 2 x Per Week/7 Days Discharge Instructions: May shower and wash wound with dial antibacterial soap and water prior to dressing change. Peri-Wound Care: Triamcinolone 15 (g) 2 x Per Week/7 Days Discharge Instructions: Use triamcinolone 15 (g) as directed Peri-Wound Care: Zinc Oxide Ointment 30g tube 2 x Per Week/7 Days Discharge Instructions: Apply Zinc Oxide to periwound with each dressing change Peri-Wound Care: Sween Lotion (Moisturizing lotion) 2 x Per Week/7 Days Discharge Instructions: Apply moisturizing lotion as directed Prim Dressing: KerraCel Ag Gelling Fiber Dressing, 4x5 in (silver alginate) 2 x Per Week/7 Days ary Discharge Instructions: Apply silver alginate to wound bed as instructed Secondary Dressing: ABD Pad, 5x9 2 x Per Week/7 Days Discharge Instructions: Apply over primary dressing as directed. Compression Wrap: Kerlix Roll 4.5x3.1 (in/yd) 2 x Per Week/7 Days Discharge Instructions: Apply Kerlix and Coban compression as directed. Compression Wrap: Coban Self-Adherent Wrap 4x5 (in/yd) 2 x Per Week/7 Days Discharge Instructions: Apply over Kerlix as directed. Wound #6 - Lower Leg Wound Laterality: Left, Circumferential Cleanser: Soap and Water Discharge Instructions: May shower and wash wound with dial antibacterial soap and water prior to dressing change. Peri-Wound Care: Triamcinolone 15 (g) Discharge Instructions: Use triamcinolone 15 (g) as directed Peri-Wound Care: Zinc Oxide Ointment 30g tube Discharge Instructions: Apply Zinc Oxide to periwound with each dressing change Peri-Wound Care: Sween Lotion (Moisturizing lotion) Discharge Instructions: Apply moisturizing lotion as directed Prim Dressing: KerraCel Ag Gelling Fiber Dressing, 4x5  in (silver alginate) ary Discharge Instructions: Apply silver alginate to wound bed as instructed Secondary Dressing: ABD Pad, 5x9 Discharge Instructions: Apply over primary dressing as directed. Compression Wrap: Kerlix Roll 4.5x3.1 (in/yd) Discharge Instructions: Apply Kerlix and Coban compression as directed. Compression Wrap: Coban Self-Adherent Wrap 4x5 (in/yd) Discharge Instructions: Apply over Kerlix as directed. Electronic Signature(s) Signed: 10/20/2021 3:34:17 PM By: 10/22/2021 RN Signed: 10/20/2021 3:46:42 PM By: 10/22/2021 PA-C Entered By: Lenda Kelp on 10/20/2021 11:55:17 -------------------------------------------------------------------------------- Problem List Details Patient Name: Date of Service: MEAD, [name] 10/20/2021 10:45 A M Medical Record Number: 10/22/2021 Patient Account Number: [id number] Date of Birth/Sex: Treating RN: 1960-05-03 (61 y.o. 77, [name] Primary Care Provider: [name] Other Clinician: Referring Provider: Treating Provider/Extender: [name] in Treatment: 8 Active Problems ICD-10 Encounter Code Description Active Date MDM Diagnosis I89.0 Lymphedema, not elsewhere classified 08/25/2021 No Yes I87.333 Chronic venous hypertension (idiopathic) with ulcer and inflammation of 08/25/2021 No Yes bilateral lower extremity L97.822 Non-pressure chronic ulcer of other part of left lower leg with fat layer exposed5/24/2023 No Yes L97.812 Non-pressure chronic ulcer of other part of right lower leg with fat layer 08/25/2021 No Yes exposed N18.30 Chronic kidney disease, stage 3 unspecified 08/25/2021 No Yes I50.42 Chronic combined systolic (congestive) and diastolic (congestive) heart failure 08/25/2021 No  Yes I10 Essential (primary) hypertension 08/25/2021 No Yes 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, right leg 09/08/2021 No Yes I70.248 Atherosclerosis of native  arteries of left leg with ulceration of other part of 09/08/2021 No Yes lower leg Inactive Problems Resolved Problems Electronic Signature(s) Signed: 10/20/2021 9:50:31 AM By: [name] PA-C Entered By: [name] on 10/20/2021 09:50:30 -------------------------------------------------------------------------------- Progress Note Details Patient Name: Date of Service: [name], [name] 10/20/2021 10:45 A M Medical Record Number: [id number] Patient Account Number: [id number] Date of Birth/Sex: Treating RN: 20-Jun-1960 (61 y.o. [name], [name] Primary Care Provider: [name] Other Clinician: Referring Provider: Treating Provider/Extender: [name] in Treatment: 8 Subjective Chief Complaint Information obtained from Patient Bilateral Leg Ulcers History of Present Illness (HPI) 02/24/2020 on evaluation today patient presents with obvious chronic lymphedema of the bilateral lower extremities. Fortunately there is no signs of active infection at this time which is great news. With that being said the patient does have a recent treatment with doxycycline 100 mg for 10 days which he has completed. He does have a history of chronic kidney disease stage III as documented in his chart. He also has chronic venous insufficiency with lower extremity lymphedema noted. He has congestive heart failure and hypertension. He tells me currently that he was unaware of the kidney disease before he mentioned this today. He also tells me that his primary care provider had told him to quit taking showers based on what was going on with his legs at this time. Fortunately there is no signs of active infection at this time which is great news. No fevers, chills, nausea, vomiting, or diarrhea. 03/04/2020 on evaluation today patient actually appears to be doing much better in regard to his legs. There is really not much draining a lot of the dry skin is starting to loosen up  amount of time to get as much of this off today as possible 03/11/2020 upon evaluation today patient appears to be doing well currently in regard to his legs. He still has a lot of dry skin some which I was able to get off today but overall I think that the biggest thing he needs right now is to be able to wash his legs in the shower to loosen some of this up which I think will then come off much more effectively and quickly without as much of an issue. Fortunately there is no evidence of active infection at this time. No fevers, chills, nausea, vomiting, or diarrhea. Readmission: 06/05/2020 upon evaluation today patient appears to be doing worse in regard to his bilateral lower extremities. Unfortunately he has had some issues here with his leg started to swell again he tells me he was not able to wear the compression stockings that he got from elastic therapy. He states when he called the leg he said that the measurements did not seem right and subsequently just sent him something she thought would work. Either way he tells me they were very tight and very uncomfortable he just was not able to stick with it. Fortunately there is no signs of active infection at this time. No fevers, chills, nausea, vomiting, or diarrhea. 06/12/2020 on evaluation today patient actually appears to be doing well in regard to his legs. He has a lot of dry skin but nothing that appears to be open at this point. He did not get the Velcro compression wraps but does have the pull up compression stockings which if he  wears those can definitely be sufficient here. Readmission: 08-25-2021 upon evaluation today patient appears to be doing well with regard to his legs all things considered. With that being said I do not see anything that actively looks like it is open or draining but I definitely see spots where he had blistering and obvious signs of drainage. Subsequently he is supposed to be having open heart surgery to replace a  valve that sounds like but they are not able to do that simply due to the fact that right now he is having more issues currently with his legs and they will not do anything until that is resolved. Fortunately I do not see any evidence of active infection locally or systemically which is great news. No fevers, chills, nausea, vomiting, or diarrhea. The patient does have a history noted previously of lymphedema, chronic venous hypertension, chronic kidney disease stage III, congestive heart failure, and hypertension. 09-01-2021 upon evaluation today patient actually appears to be healed I do not see anything open at this time which is great news. Fortunately he is doing excellent and I think they were on the right track. With that being said there were a couple areas where he was so dry that the wrap actually stuck to his leg and this has caused some issues here with a couple small openings remaining but I think by next week these will be sealed up. Overall I think that he needs some lotion twice a day and Ace wraps are probably to be the best way to go to help with compression for now. He does have the vascular appointment upcoming in a couple of days. 6/7; patient comes in today with quite a deterioration from last week. Coupled with this is that he had his arterial studies which were really not very good on the right his ABI was 0.92 and on the left 1.09 but TBI was 0.52 on the right and 0.36 on the left he had monophasic waveforms almost diffusely on the right with an estimated 50 to 74% stenosis of the distal SFA on the left he had similar findings. 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 We are working on getting him a vascular evaluation./Consultation Also looking through his chart he has a lot of other medical problems including severe  aortic stenosis and he has been referred to cardiac surgery. He has atrial fibrillation on Eliquis 09-15-2021 upon evaluation today patient appears to be doing better than it sounds like he was last week. Fortunately I do not see any evidence of active infection locally or systemically which is great news. No fevers, chills, nausea, vomiting, or diarrhea. He does have an appointment with vascular tomorrow. 09-22-2021 upon evaluation today patient appears to be doing well with regard to his legs all things considered. I did review his note and it appears that he has significant peripheral vascular disease bilaterally in the lower extremities. Subsequently this seems to be even a femoral and iliac arteries. Subsequently I do believe that the arteriogram is probably going to be a good way to go as far as trying to improve his blood flow as noted in the record from Dr. Deitra Mayo on 09-16-2021. 09-29-2021 upon evaluation today patient appears to be doing decently well today in regard to his wounds on the legs. Things are actually showing signs of good improvement and I  am very pleased in that regard. I do not see any signs of infection at this time which is great news and in fact everything is pretty dry today. He does have his arteriogram with Dr. Doren Custard on Friday. With that being said once that is complete I really think things will be significantly improved and hopefully he should be able to not only feel better but also continue with his own compression socks. 10-06-2021 upon evaluation today patient appears to be doing well with regard to his legs in fact there is very little open at this point. Fortunately I do not see any signs of active infection which is great news. No fevers, chills, nausea, vomiting, or diarrhea. 10-13-2021 upon evaluation today patient's legs actually appear to be doing decently well. I am really seeing not much in the way of drainage at this point. Fortunately I think that  he is on the right track and I believe that we are very close to complete resolution and discharge and at this point I do think that he is tolerating the compression wraps without complication. He has his appointment with vascular coming up on the 21st of this month which is just a week and a half away. 10-20-2021 upon evaluation today patient appears to be doing well currently in regard to his legs. There is very little open or draining at this point he actually has his procedure with vascular on Friday. Hopefully he will have much better blood flow following this procedure. Objective Constitutional Well-nourished and well-hydrated in no acute distress. Vitals Time Taken: 11:47 AM, Height: 69 in, Weight: 215 lbs, BMI: 31.7, Temperature: 98.7 F, Pulse: 72 bpm, Respiratory Rate: 17 breaths/min, Blood Pressure: 220/135 mmHg. General Notes: manual 162/121 Respiratory normal breathing without difficulty. Psychiatric this patient is able to make decisions and demonstrates good insight into disease process. Alert and Oriented x 3. pleasant and cooperative. General Notes: Upon inspection patient's wounds again really there is not much open on his legs he is doing quite well I think if we can get his blood flow under good control here then we can recommend some stronger compression to get things moving in a much better direction and the patient is in agreement with plan and is happy to hear this. Integumentary (Hair, Skin) Wound #5 status is Open. Original cause of wound was Gradually Appeared. The date acquired was: 07/03/2021. The wound has been in treatment 8 weeks. The wound is located on the Right,Circumferential Lower Leg. The wound measures 22cm length x 25cm width x 0.1cm depth; 431.969cm^2 area and 43.197cm^3 volume. There is no tunneling or undermining noted. There is a large amount of serosanguineous drainage noted. The wound margin is distinct with the outline attached to the wound base.  There is medium (34-66%) red, pink granulation within the wound bed. There is a medium (34-66%) amount of necrotic tissue within the wound bed including Adherent Slough. Wound #6 status is Open. Original cause of wound was Gradually Appeared. The date acquired was: 07/03/2021. The wound has been in treatment 8 weeks. The wound is located on the Left,Circumferential Lower Leg. The wound measures 20cm length x 25cm width x 0.1cm depth; 392.699cm^2 area and 39.27cm^3 volume. There is no tunneling or undermining noted. There is a large amount of serosanguineous drainage noted. The wound margin is distinct with the outline attached to the wound base. There is small (1-33%) red granulation within the wound bed. There is a large (67-100%) amount of necrotic tissue within the wound bed  including Eschar and Adherent Slough. 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 - Wed. 10/27/21 @1130  w/ , [place] (Dr. Allen Derry coverin) and Leanord Hawking Maryruth Bun coverin) Room # 9 Other: - Arteriography 10/22/21 w/ Dr. 10/24/21 Shower/ Hygiene: May shower with protection but do not get wound dressing(s) wet. Edema Control - Lymphedema / SCD / Other: Elevate legs to the level of the heart or above for 30 minutes daily and/or when sitting, a frequency  of: Avoid standing for long periods of time. WOUND #5: - Lower Leg Wound Laterality: Right, Circumferential Cleanser: Soap and Water 2 x Per Week/7 Days Discharge Instructions: May shower and wash wound with dial antibacterial soap and water prior to dressing change. Peri-Wound Care: Triamcinolone 15 (g) 2 x Per Week/7 Days Discharge Instructions: Use triamcinolone 15 (g) as directed Peri-Wound Care: Zinc Oxide Ointment 30g tube 2 x Per Week/7 Days Discharge Instructions: Apply Zinc Oxide to periwound with each dressing change Peri-Wound Care: Sween Lotion (Moisturizing lotion) 2 x Per Week/7 Days Discharge Instructions: Apply moisturizing lotion as directed Prim Dressing: KerraCel Ag Gelling Fiber Dressing, 4x5 in (silver alginate) 2 x Per Week/7 Days ary Discharge Instructions: Apply silver alginate to wound bed as instructed Secondary Dressing: ABD Pad, 5x9 2 x Per Week/7 Days Discharge Instructions: Apply over primary dressing as directed. Com pression Wrap: Kerlix Roll 4.5x3.1 (in/yd) 2 x Per Week/7 Days Discharge Instructions: Apply Kerlix and Coban compression as directed. Com pression Wrap: Coban Self-Adherent Wrap 4x5 (in/yd) 2 x Per Week/7 Days Discharge Instructions: Apply over Kerlix as directed. WOUND #6: - Lower Leg Wound Laterality: Left, Circumferential Cleanser: Soap and Water Discharge Instructions: May shower and wash wound with dial antibacterial soap and water prior to dressing change. Peri-Wound Care: Triamcinolone 15 (g) Discharge Instructions: Use triamcinolone 15 (g) as directed Peri-Wound Care: Zinc Oxide Ointment 30g tube Discharge Instructions: Apply Zinc Oxide to periwound with each dressing change Peri-Wound Care: Sween Lotion (Moisturizing lotion) Discharge Instructions: Apply moisturizing lotion as directed Prim Dressing: KerraCel Ag Gelling Fiber Dressing, 4x5 in (silver alginate) ary Discharge Instructions: Apply silver alginate to wound bed as  instructed Secondary Dressing: ABD Pad, 5x9 Discharge Instructions: Apply over primary dressing as directed. Com pression Wrap: Kerlix Roll 4.5x3.1 (in/yd) Discharge Instructions: Apply Kerlix and Coban compression as directed. Com pression Wrap: Coban Self-Adherent Wrap 4x5 (in/yd) Discharge Instructions: Apply over Kerlix as directed. 1. I would recommend currently that we going continue with the wound care measures as before and the patient is in agreement with plan. This includes the use of the silver alginate dressing which I think is doing a great job. 2. I am also can recommend ABD pad to cover followed by roll gauze to secure in place. 3. We are using the Coban over top in order to help with some compression although I think once we have his procedure complete he may be better off with even stronger compression. We will see patient back for reevaluation in 1  week here in the clinic. If anything worsens or changes patient will contact our office for additional recommendations. Electronic Signature(s) Signed: 10/20/2021 2:43:55 PM By: [name] PA-C Entered By: [name] on 10/20/2021 14:43:54 -------------------------------------------------------------------------------- SuperBill Details Patient Name: Date of Service: [name], [name] 10/20/2021 Medical Record Number: [id number] Patient Account Number: [id number] Date of Birth/Sex: Treating RN: [birth year] (61 y.o. 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 L97.822 Non-pressure chronic ulcer of other part of left lower leg with fat layer exposed L97.812 Non-pressure chronic ulcer of other  part of right lower leg with fat layer exposed N18.30 Chronic kidney disease, stage 3 unspecified I50.42 Chronic combined systolic (congestive) and diastolic (congestive) heart failure I10 Essential (primary) hypertension 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, 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 Facility Procedures CPT4 Code: [id number] Description: 99214 - WOUND CARE VISIT-LEV 4 EST PT Modifier: Quantity: 1 Physician Procedures : CPT4 Code Description Modifier [id number] - WC PHYS LEVEL 3 - EST PT ICD-10 Diagnosis Description I89.0 Lymphedema, not elsewhere classified I87.333 Chronic venous hypertension (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

## 2021-10-22 ENCOUNTER — Other Ambulatory Visit: Payer: Self-pay

## 2021-10-22 ENCOUNTER — Ambulatory Visit (HOSPITAL_COMMUNITY)
Admission: RE | Admit: 2021-10-22 | Discharge: 2021-10-22 | Disposition: A | Discharge status: Home / Self Care | Payer: Medicare Other | Attending: Vascular Surgery | Admitting: Vascular Surgery

## 2021-10-22 ENCOUNTER — Encounter (HOSPITAL_COMMUNITY)
Admission: RE | Disposition: A | Discharge status: Home / Self Care | Payer: Self-pay | Source: Home / Self Care | Attending: Vascular Surgery

## 2021-10-22 DIAGNOSIS — I130 Hypertensive heart and chronic kidney disease with heart failure and stage 1 through stage 4 chronic kidney disease, or unspecified chronic kidney disease: Secondary | ICD-10-CM | POA: Diagnosis not present

## 2021-10-22 DIAGNOSIS — N183 Chronic kidney disease, stage 3 unspecified: Secondary | ICD-10-CM | POA: Insufficient documentation

## 2021-10-22 DIAGNOSIS — I70299 Other atherosclerosis of native arteries of extremities, unspecified extremity: Secondary | ICD-10-CM

## 2021-10-22 DIAGNOSIS — I509 Heart failure, unspecified: Secondary | ICD-10-CM | POA: Insufficient documentation

## 2021-10-22 DIAGNOSIS — I70213 Atherosclerosis of native arteries of extremities with intermittent claudication, bilateral legs: Secondary | ICD-10-CM | POA: Diagnosis not present

## 2021-10-22 HISTORY — PX: ABDOMINAL AORTOGRAM W/LOWER EXTREMITY: CATH118223

## 2021-10-22 HISTORY — PX: PERIPHERAL VASCULAR BALLOON ANGIOPLASTY: CATH118281

## 2021-10-22 LAB — POCT I-STAT, CHEM 8
BUN: 23 mg/dL (ref 8–23)
BUN: 22 mg/dL (ref 8–23)
Calcium, Ion: 1.18 mmol/L (ref 1.15–1.40)
Calcium, Ion: 1.13 mmol/L — ABNORMAL LOW (ref 1.15–1.40)
Chloride: 108 mmol/L (ref 98–111)
Chloride: 110 mmol/L (ref 98–111)
Creatinine, Ser: 1.3 mg/dL — ABNORMAL HIGH (ref 0.61–1.24)
Creatinine, Ser: 1.4 mg/dL — ABNORMAL HIGH (ref 0.61–1.24)
Glucose, Bld: 121 mg/dL — ABNORMAL HIGH (ref 70–99)
Glucose, Bld: 121 mg/dL — ABNORMAL HIGH (ref 70–99)
HCT: 35 % — ABNORMAL LOW (ref 39.0–52.0)
HCT: 38 % — ABNORMAL LOW (ref 39.0–52.0)
Hemoglobin: 11.9 g/dL — ABNORMAL LOW (ref 13.0–17.0)
Hemoglobin: 12.9 g/dL — ABNORMAL LOW (ref 13.0–17.0)
Potassium: 6.4 mmol/L (ref 3.5–5.1)
Potassium: 5.3 mmol/L — ABNORMAL HIGH (ref 3.5–5.1)
Sodium: 141 mmol/L (ref 135–145)
Sodium: 141 mmol/L (ref 135–145)
TCO2: 25 mmol/L (ref 22–32)
TCO2: 26 mmol/L (ref 22–32)

## 2021-10-22 LAB — POCT ACTIVATED CLOTTING TIME
Activated Clotting Time: 227 seconds
Activated Clotting Time: 191 seconds
Activated Clotting Time: 185 seconds
Activated Clotting Time: 173 seconds

## 2021-10-22 SURGERY — ABDOMINAL AORTOGRAM W/LOWER EXTREMITY
Anesthesia: LOCAL | Laterality: Left

## 2021-10-22 MED ORDER — FENTANYL CITRATE (PF) 100 MCG/2ML IJ SOLN
INTRAMUSCULAR | Status: DC | PRN
  Administered 2021-10-22: 50 ug via INTRAVENOUS

## 2021-10-22 MED ORDER — ONDANSETRON HCL 4 MG/2ML IJ SOLN: 4.0000 mg | Freq: Four times a day (QID) | INTRAMUSCULAR | Status: DC | PRN

## 2021-10-22 MED ORDER — HEPARIN SODIUM (PORCINE) 1000 UNIT/ML IJ SOLN
INTRAMUSCULAR | Status: AC
  Filled 2021-10-22: qty 10

## 2021-10-22 MED ORDER — HEPARIN (PORCINE) IN NACL 2000-0.9 UNIT/L-% IV SOLN
INTRAVENOUS | Status: AC
  Filled 2021-10-22: qty 1000

## 2021-10-22 MED ORDER — HEPARIN (PORCINE) IN NACL 1000-0.9 UT/500ML-% IV SOLN
INTRAVENOUS | Status: DC | PRN
  Administered 2021-10-22 (×2): 500 mL

## 2021-10-22 MED ORDER — LABETALOL HCL 5 MG/ML IV SOLN
10.0000 mg | INTRAVENOUS | Status: AC | PRN
  Administered 2021-10-22 (×4): 10 mg via INTRAVENOUS
  Filled 2021-10-22: qty 4

## 2021-10-22 MED ORDER — LABETALOL HCL 5 MG/ML IV SOLN
INTRAVENOUS | Status: DC | PRN
  Administered 2021-10-22: 20 mg via INTRAVENOUS

## 2021-10-22 MED ORDER — CLOPIDOGREL BISULFATE 75 MG PO TABS
ORAL_TABLET | ORAL | Status: AC
  Filled 2021-10-22: qty 4

## 2021-10-22 MED ORDER — LABETALOL HCL 5 MG/ML IV SOLN
INTRAVENOUS | Status: AC
  Filled 2021-10-22: qty 4

## 2021-10-22 MED ORDER — HYDRALAZINE HCL 20 MG/ML IJ SOLN
INTRAMUSCULAR | Status: AC
  Filled 2021-10-22: qty 1

## 2021-10-22 MED ORDER — FUROSEMIDE 10 MG/ML IJ SOLN
20.0000 mg | Freq: Once | INTRAMUSCULAR | Status: AC
  Administered 2021-10-22: 20 mg via INTRAVENOUS

## 2021-10-22 MED ORDER — ASPIRIN 81 MG PO CHEW
CHEWABLE_TABLET | ORAL | Status: AC
  Filled 2021-10-22: qty 1

## 2021-10-22 MED ORDER — ASPIRIN 81 MG PO CHEW
CHEWABLE_TABLET | ORAL | Status: DC | PRN
  Administered 2021-10-22: 81 mg via ORAL

## 2021-10-22 MED ORDER — SODIUM CHLORIDE 0.9 % IV SOLN: INTRAVENOUS | Status: DC

## 2021-10-22 MED ORDER — IODIXANOL 320 MG/ML IV SOLN
INTRAVENOUS | Status: DC | PRN
  Administered 2021-10-22: 135 mL

## 2021-10-22 MED ORDER — HEPARIN SODIUM (PORCINE) 1000 UNIT/ML IJ SOLN
INTRAMUSCULAR | Status: DC | PRN
  Administered 2021-10-22: 10000 [IU] via INTRAVENOUS

## 2021-10-22 MED ORDER — SODIUM CHLORIDE 0.9% FLUSH
3.0000 mL | INTRAVENOUS | Status: DC | PRN
Start: 2021-10-22 — End: 2021-10-22

## 2021-10-22 MED ORDER — HEPARIN (PORCINE) IN NACL 1000-0.9 UT/500ML-% IV SOLN
INTRAVENOUS | Status: AC
  Filled 2021-10-22: qty 500

## 2021-10-22 MED ORDER — MIDAZOLAM HCL 2 MG/2ML IJ SOLN
INTRAMUSCULAR | Status: DC | PRN
  Administered 2021-10-22: 2 mg via INTRAVENOUS

## 2021-10-22 MED ORDER — CLOPIDOGREL BISULFATE 75 MG PO TABS: 300.0000 mg | ORAL_TABLET | Freq: Once | ORAL | Status: DC

## 2021-10-22 MED ORDER — CLOPIDOGREL BISULFATE 75 MG PO TABS
75.0000 mg | ORAL_TABLET | Freq: Every day | ORAL | Status: DC
Start: 2021-10-23 — End: 2021-10-22

## 2021-10-22 MED ORDER — MIDAZOLAM HCL 2 MG/2ML IJ SOLN
INTRAMUSCULAR | Status: AC
  Filled 2021-10-22: qty 2

## 2021-10-22 MED ORDER — SODIUM CHLORIDE 0.9 % WEIGHT BASED INFUSION: 1.0000 mL/kg/h | INTRAVENOUS | Status: DC

## 2021-10-22 MED ORDER — ALBUTEROL SULFATE HFA 108 (90 BASE) MCG/ACT IN AERS
2.0000 | INHALATION_SPRAY | RESPIRATORY_TRACT | Status: DC
  Administered 2021-10-22: 2 via RESPIRATORY_TRACT
  Filled 2021-10-22: qty 6.7

## 2021-10-22 MED ORDER — LIDOCAINE HCL (PF) 1 % IJ SOLN
INTRAMUSCULAR | Status: AC
  Filled 2021-10-22: qty 30

## 2021-10-22 MED ORDER — LIDOCAINE HCL (PF) 1 % IJ SOLN
INTRAMUSCULAR | Status: DC | PRN
  Administered 2021-10-22: 15 mL

## 2021-10-22 MED ORDER — HYDRALAZINE HCL 20 MG/ML IJ SOLN
5.0000 mg | INTRAMUSCULAR | Status: AC | PRN
  Administered 2021-10-22 (×2): 5 mg via INTRAVENOUS

## 2021-10-22 MED ORDER — FUROSEMIDE 10 MG/ML IJ SOLN
INTRAMUSCULAR | Status: AC
  Filled 2021-10-22: qty 4

## 2021-10-22 MED ORDER — CLOPIDOGREL BISULFATE 300 MG PO TABS
ORAL_TABLET | ORAL | Status: DC | PRN
  Administered 2021-10-22: 300 mg via ORAL

## 2021-10-22 MED ORDER — ACETAMINOPHEN 325 MG PO TABS: 650.0000 mg | ORAL_TABLET | ORAL | Status: DC | PRN

## 2021-10-22 MED ORDER — FENTANYL CITRATE (PF) 100 MCG/2ML IJ SOLN
INTRAMUSCULAR | Status: AC
  Filled 2021-10-22: qty 2

## 2021-10-22 MED ORDER — SODIUM CHLORIDE 0.9 % IV SOLN: 250.0000 mL | INTRAVENOUS | Status: DC | PRN

## 2021-10-22 MED ORDER — SODIUM CHLORIDE 0.9% FLUSH: 3.0000 mL | Freq: Two times a day (BID) | INTRAVENOUS | Status: DC

## 2021-10-22 SURGICAL SUPPLY — 17 items
BALLN COYOTE OTW 3.5X80X150 (BALLOONS) ×2
BALLOON COYOTE OTW 3.5X80X150 (BALLOONS) IMPLANT
CATH CXI 2.3F 150 ST (CATHETERS) ×1 IMPLANT
CATH OMNI FLUSH 5F 65CM (CATHETERS) ×1 IMPLANT
GLIDEWIRE ADV .035X260CM (WIRE) ×1 IMPLANT
KIT ENCORE 26 ADVANTAGE (KITS) ×1 IMPLANT
KIT MICROPUNCTURE NIT STIFF (SHEATH) ×1 IMPLANT
KIT PV (KITS) ×2 IMPLANT
SHEATH PINNACLE 5F 10CM (SHEATH) ×2 IMPLANT
SHEATH PROBE COVER 6X72 (BAG) ×1 IMPLANT
SHEATH SHUTTLE 5F/110 (SHEATH) ×1 IMPLANT
SYR MEDRAD MARK V 150ML (SYRINGE) ×1 IMPLANT
TRANSDUCER W/STOPCOCK (MISCELLANEOUS) ×2 IMPLANT
TRAY PV CATH (CUSTOM PROCEDURE TRAY) ×2 IMPLANT
WIRE AMPLATZ SS-J .035X180CM (WIRE) ×1 IMPLANT
WIRE BENTSON .035X145CM (WIRE) ×1 IMPLANT
WIRE SPARTACORE .014X300CM (WIRE) ×1 IMPLANT

## 2021-10-22 NOTE — Progress Notes (Signed)
55fr sheath aspirated and removed from right groin. Manual pressure held for . Tegaderm/guaze placed over site. No hematoma, site level 0. Right popliteal pulse confirmed with doppler. Bedrest education given.    Bedrest began at 1320.

## 2021-10-22 NOTE — Op Note (Signed)
DATE OF SERVICE: 10/22/2021  PATIENT:  [name]  61 y.o. male  PRE-OPERATIVE DIAGNOSIS:  Atherosclerosis of native arteries of bilateral lower extremities; venous ulceration   POST-OPERATIVE DIAGNOSIS:  Same  PROCEDURE:   1) Ultrasound guided right common femoral artery access 2) Aortogram 3) Bilateral lower extremity angiogram ( total contrast) 4) Left tibioperoneal trunk angioplasty (3.5 x 25mm Coyote) 5) Left peroneal artery angioplasty (3.5 x 95mm Coyote) 6) Conscious sedation (52 minutes)  SURGEON:  Rande Brunt. Lenell Antu, MD  ASSISTANT: none  ANESTHESIA:   local and IV sedation  ESTIMATED BLOOD LOSS: minimal  LOCAL MEDICATIONS USED:  LIDOCAINE   COUNTS: confirmed correct.  PATIENT DISPOSITION:  PACU - hemodynamically stable.   Delay start of Pharmacological VTE agent (>24hrs) due to surgical blood loss or risk of bleeding: no  INDICATION FOR PROCEDURE: [name] is a 61 y.o. male with noninvasive evidence of atherosclerosis of bilateral lower extremities with venous stasis ulceration. After careful discussion of risks, benefits, and alternatives the patient was offered angiography with intervention. The patient understood and wished to proceed.  OPERATIVE FINDINGS:  Terminal aorta and iliac arteries: Widely patent without flow-limiting stenosis  Right lower extremity: Common femoral artery: Widely patent without flow-limiting stenosis  Profunda femoris artery: Widely patent without flow-limiting stenosis  Superficial femoral artery: Widely patent without flow-limiting stenosis Popliteal artery: Widely patent without flow-limiting stenosis Anterior tibial artery: occluded Tibioperoneal trunk: Widely patent without flow-limiting stenosis Peroneal artery: Widely patent without flow-limiting stenosis; courses to ankle, fills foot via collaterals Posterior tibial artery: occluded Pedal circulation: fills via peroneal  Left lower extremity: Common femoral artery:  Widely patent without flow-limiting stenosis  Profunda femoris artery: Widely patent without flow-limiting stenosis  Superficial femoral artery: Widely patent without flow-limiting stenosis Popliteal artery: irregular plaque behind and below the knee Anterior tibial artery: occluded Tibioperoneal trunk: Distal stenosis ~60% Peroneal artery: proximal stenosis ~60%; otherwise widely patent without flow-limiting stenosis; courses to ankle, fills foot via collaterals Posterior tibial artery: occluded Pedal circulation: fills via peroneal  DESCRIPTION OF PROCEDURE: After identification of the patient in the pre-operative holding area, the patient was transferred to the operating room. The patient was positioned supine on the operating room table. Anesthesia was induced. The groins was prepped and draped in standard fashion. A surgical pause was performed confirming correct patient, procedure, and operative location.  The right groin was anesthetized with subcutaneous injection of 1% lidocaine. Using ultrasound guidance, the left common femoral artery was accessed with micropuncture technique. Fluoroscopy was used to confirm cannulation over the femoral head. The 733F sheath was upsized to 33F.   A Benson wire was advanced into the distal aorta. Over the wire an omni flush catheter was advanced to the level of L2. Aortogram was performed - see above for details. Bilateral lower extremity runoff angiography was performed - see above for details.  The left common iliac artery was selected with an omniflush catheter and glidewire advantage guidewire. The wire was advanced into the common femoral artery. Over the wire the omni flush catheter was advanced into the external iliac artery. Selective angiography was performed - see above for details.   The decision was made to intervene. The patient was heparinized with 8000 units of heparin. The 33F sheath was exchanged for a 33F x 110cm sheath. Selective angiography  of the left lower extremity was performed prior to intervention.   The lesions were treated with: Left tibioperoneal trunk angioplasty (3.5 x 54mm Coyote) Left peroneal artery angioplasty (3.5 x 69mm  Coyote)  Completion angiography revealed:  Resolution of the TP and peroneal stenosis  The sheath was left in place to be removed in the recovery area.   Conscious sedation was administered with the use of IV fentanyl and midazolam under continuous physician and nurse monitoring.  Heart rate, blood pressure, and oxygen saturation were continuously monitored.  Total sedation time was 52 minutes  Upon completion of the case instrument and sharps counts were confirmed correct. The patient was transferred to the PACU in good condition. I was present for all portions of the procedure.  PLAN: ASA 81mg  PO QD. Plavix 75mg  PO QD. High intensity statin therapy. Follow up with Dr. or myself in 4 weeks to evaluate wounds and discuss options.  . Edilia Bo, MD [facility] Phone Number: [phone] 10/22/2021 10:08 AM

## 2021-10-22 NOTE — Progress Notes (Addendum)
Patient has expiratory wheezing, states he feels "a little SOB but not much". Dr. Lenell Antu notified, will put orders in.

## 2021-10-22 NOTE — Progress Notes (Signed)
Sleeping. Respirations less labored; wheezing less

## 2021-10-22 NOTE — Progress Notes (Signed)
Patient continues to have audible expiratory wheezes, slightly labored. Dr. Lenell Antu made aware. D/C'ed IV fluid. Keeping it at 10cc/hr until femoral sheath is removed

## 2021-10-22 NOTE — Progress Notes (Addendum)
Paged Dr Lenell Antu, pt to restart eliquis tomorrow 7/22, he will not prescribe plavix for home use. Spoke with Dr Lenell Antu, pt ok to go home with bp 172/118 [id number]
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
## 2021-10-25 ENCOUNTER — Encounter (HOSPITAL_COMMUNITY): Payer: Self-pay | Admitting: Vascular Surgery

## 2021-10-26 ENCOUNTER — Encounter: Payer: Self-pay | Admitting: Cardiology

## 2021-10-26 ENCOUNTER — Ambulatory Visit (INDEPENDENT_AMBULATORY_CARE_PROVIDER_SITE_OTHER): Payer: Medicare Other | Admitting: Cardiology

## 2021-10-26 ENCOUNTER — Other Ambulatory Visit: Payer: Self-pay | Admitting: Critical Care Medicine

## 2021-10-26 VITALS — BP 179/119 | HR 71 | Ht 69.0 in | Wt 213.8 lb

## 2021-10-26 DIAGNOSIS — I4819 Other persistent atrial fibrillation: Secondary | ICD-10-CM | POA: Diagnosis not present

## 2021-10-26 DIAGNOSIS — I10 Essential (primary) hypertension: Secondary | ICD-10-CM

## 2021-10-26 DIAGNOSIS — I350 Nonrheumatic aortic (valve) stenosis: Secondary | ICD-10-CM

## 2021-10-26 DIAGNOSIS — I739 Peripheral vascular disease, unspecified: Secondary | ICD-10-CM | POA: Diagnosis not present

## 2021-10-26 DIAGNOSIS — I5032 Chronic diastolic (congestive) heart failure: Secondary | ICD-10-CM
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
## 2021-10-26 NOTE — Patient Instructions (Addendum)
Medication Instructions:  Your physician recommends that you continue on your current medications as directed. Please refer to the Current Medication list given to you today.  *If you need a refill on your cardiac medications before your next appointment, please call your pharmacy*   Lab Work: BMET, Mag today  If you have labs (blood work) drawn today and your tests are completely normal, you will receive your results only by: MyChart Message (if you have MyChart) OR A paper copy in the mail If you have any lab test that is abnormal or we need to change your treatment, we will call you to review the results.  Follow-Up: At [facility], you and your health needs are our priority.  As part of our continuing mission to provide you with exceptional heart care, we have created designated Provider Care Teams.  These Care Teams include your primary Cardiologist (physician) and Advanced Practice Providers (APPs -  Physician Assistants and Nurse Practitioners) who all work together to provide you with the care you need, when you need it.  We recommend signing up for the patient portal called "MyChart".  Sign up information is provided on this After Visit Summary.  MyChart is used to connect with patients for Virtual Visits (Telemedicine).  Patients are able to view lab/test results, encounter notes, upcoming appointments, etc.  Non-urgent messages can be sent to your provider as well.   To learn more about what you can do with MyChart, go to [email or web address].    Your next appointment:   As scheduled with Dr. Bjorn Pippin    Important Information About Sugar

## 2021-10-27 ENCOUNTER — Encounter (HOSPITAL_BASED_OUTPATIENT_CLINIC_OR_DEPARTMENT_OTHER): Payer: Medicare Other | Admitting: Internal Medicine

## 2021-10-27 ENCOUNTER — Other Ambulatory Visit: Payer: Self-pay | Admitting: *Deleted

## 2021-10-27 DIAGNOSIS — I890 Lymphedema, not elsewhere classified: Secondary | ICD-10-CM | POA: Diagnosis not present

## 2021-10-27 DIAGNOSIS — I87333 Chronic venous hypertension (idiopathic) with ulcer and inflammation of bilateral lower extremity: Secondary | ICD-10-CM | POA: Diagnosis not present

## 2021-10-27 DIAGNOSIS — I5032 Chronic diastolic (congestive) heart failure: Secondary | ICD-10-CM

## 2021-10-27 DIAGNOSIS — Z79899 Other long term (current) drug therapy: Secondary | ICD-10-CM

## 2021-10-27 DIAGNOSIS — L97812 Non-pressure chronic ulcer of other part of right lower leg with fat layer exposed: Secondary | ICD-10-CM | POA: Diagnosis not present

## 2021-10-27 DIAGNOSIS — I872 Venous insufficiency (chronic) (peripheral): Secondary | ICD-10-CM | POA: Diagnosis not present

## 2021-10-27 DIAGNOSIS — L97822 Non-pressure chronic ulcer of other part of left lower leg with fat layer exposed: Secondary | ICD-10-CM | POA: Diagnosis not present

## 2021-10-27 DIAGNOSIS — I10 Essential (primary) hypertension: Secondary | ICD-10-CM

## 2021-10-27 DIAGNOSIS — N183 Chronic kidney disease, stage 3 unspecified: Secondary | ICD-10-CM | POA: Diagnosis not present

## 2021-10-27 DIAGNOSIS — I130 Hypertensive heart and chronic kidney disease with heart failure and stage 1 through stage 4 chronic kidney disease, or unspecified chronic kidney disease: Secondary | ICD-10-CM | POA: Diagnosis not present

## 2021-10-27 DIAGNOSIS — I350 Nonrheumatic aortic (valve) stenosis: Secondary | ICD-10-CM | POA: Diagnosis not present

## 2021-10-27 DIAGNOSIS — I5042 Chronic combined systolic (congestive) and diastolic (congestive) heart failure: Secondary | ICD-10-CM | POA: Diagnosis not present

## 2021-10-27 DIAGNOSIS — I70248 Atherosclerosis of native arteries of left leg with ulceration of other part of lower left leg: Secondary | ICD-10-CM | POA: Diagnosis not present

## 2021-10-27 DIAGNOSIS — L97909 Non-pressure chronic ulcer of unspecified part of unspecified lower leg with unspecified severity: Secondary | ICD-10-CM

## 2021-10-27 LAB — BASIC METABOLIC PANEL WITH GFR
BUN/Creatinine Ratio: 13 (ref 10–24)
BUN: 18 mg/dL (ref 8–27)
CO2: 18 mmol/L — ABNORMAL LOW (ref 20–29)
Calcium: 9 mg/dL (ref 8.6–10.2)
Chloride: 105 mmol/L (ref 96–106)
Creatinine, Ser: 1.35 mg/dL — ABNORMAL HIGH (ref 0.76–1.27)
Glucose: 87 mg/dL (ref 70–99)
Potassium: 4.3 mmol/L (ref 3.5–5.2)
Sodium: 139 mmol/L (ref 134–144)
eGFR: 60 mL/min/1.73

## 2021-10-27 LAB — MAGNESIUM: Magnesium: 2.4 mg/dL — ABNORMAL HIGH (ref 1.6–2.3)

## 2021-10-27 MED ORDER — POTASSIUM CHLORIDE ER 20 MEQ PO TBCR: 20.0000 meq | EXTENDED_RELEASE_TABLET | Freq: Every day | ORAL | 3 refills | Status: DC

## 2021-10-27 MED ORDER — SPIRONOLACTONE 25 MG PO TABS: 25.0000 mg | ORAL_TABLET | Freq: Every day | ORAL | 3 refills | Status: DC

## 2021-10-27 NOTE — Progress Notes (Signed)
GOBLE, FUDALA ([id number]) Visit Report for 10/27/2021 Arrival Information Details Patient Name: Date of Service: [name], [name] 10/27/2021 11:30 A M Medical Record Number: [id number] Patient Account Number: [id number] Date of Birth/Sex: Treating RN: 06/16/1960 (61 y.o. [name] [name]: [name] Other Clinician: Referring [name] [name] in Treatment: 9 Visit Information History Since Last Visit Added or deleted any medications: No Patient Arrived: Ambulatory Any new allergies or adverse reactions: No Arrival Time: 12:02 Had a fall or experienced change in No Accompanied By: self activities of daily living that may affect Transfer Assistance: None risk of falls: Patient Identification Verified: Yes Signs or symptoms of abuse/neglect since last visito No Secondary Verification Process Completed: Yes Hospitalized since last visit: No Patient Requires Transmission-Based Precautions: No Implantable device outside of the clinic excluding No Patient Has Alerts: No cellular tissue based products placed in the center since last visit: Has Dressing in Place as Prescribed: Yes Has Compression in Place as Prescribed: Yes Pain Present Now: No Electronic Signature(s) Signed: 10/27/2021 4:47:33 PM By: Shawn Pulling RN, BSN Entered By: [name] on 10/27/2021 12:03:23 -------------------------------------------------------------------------------- Compression Therapy Details Patient Name: Date of Service: [name], [name] 10/27/2021 11:30 A M Medical Record Number: [id number] Patient Account Number: [id number] Date of Birth/Sex: Treating RN: 1960/10/09 (61 y.o. [name] Majel Giel: [name] Other Clinician: Referring Raynor Calcaterra: Small Teigen Bellin/Extender: [name] in Treatment: 9 Compression Therapy Performed for Wound Assessment: Wound #5  Right,Circumferential Lower Leg Performed By: Clinician Shawn Pulling, RN Compression Type: Three Layer Post Procedure Diagnosis Same as Pre-procedure Electronic Signature(s) Signed: 10/27/2021 4:47:33 PM By: Shawn Pulling RN, BSN Entered By: [name] on 10/27/2021 12:15:38 -------------------------------------------------------------------------------- Compression Therapy Details Patient Name: Date of Service: [name], [name] 10/27/2021 11:30 A M Medical Record Number: [id number] Patient Account Number: [id number] Date of Birth/Sex: Treating RN: [birth year] (61 y.o. [name] Dorris Pierre: [name] Other Clinician: Referring Colyn Miron: Small Tacarra Justo/Extender: [name] in Treatment: 9 Compression Therapy Performed for Wound Assessment: Wound #6 Left,Circumferential Lower Leg Performed By: Clinician Shawn Pulling, RN Compression Type: Three Layer Post Procedure Diagnosis Same as Pre-procedure Electronic Signature(s) Signed: 10/27/2021 4:47:33 PM By: Shawn Pulling RN, BSN Entered By: [name] on 10/27/2021 12:15:39 -------------------------------------------------------------------------------- Encounter Discharge Information Details Patient Name: Date of Service: [name], [name] 10/27/2021 11:30 A M Medical Record Number: [id number] Patient Account Number: [id number] Date of Birth/Sex: Treating RN: Nov 12, 1960 (61 y.o. [name] Nazaret Chea: [name] Other Clinician: Referring Chrissie Dacquisto: Small Zonnie Landen/Extender: [name] in Treatment: 9 Encounter Discharge Information Items Discharge Condition: Stable Ambulatory Status: Ambulatory Discharge Destination: Home Transportation: Private Auto Accompanied By: self Schedule Follow-up Appointment: Yes Clinical Summary of Small: Patient Declined Electronic Signature(s) Signed: 10/27/2021 4:47:33 PM By: Shawn Pulling RN, BSN Entered By: [name] on 10/27/2021 12:16:50 -------------------------------------------------------------------------------- Lower Extremity Assessment Details Patient Name: Date of Service: [name], [name] 10/27/2021 11:30 A M Medical Record Number: [id number] Patient Account Number: [id number] Date of Birth/Sex: Treating RN: [birth year] (61 y.o. [name] Nobie Alleyne: [name] Other Clinician: Referring Darnel Mchan: Small Lanette Ell/Extender: [name] in Treatment: 9 Edema Assessment Assessed: [name]: No] [name]: No] Edema: [Left: Yes] [Right: Yes] Calf Left: Right: Point of Measurement: From Medial Instep 39.5 cm 35.7 cm Ankle Left: Right: Point of Measurement: From Medial Instep 26.5 cm 25 cm Vascular Assessment Pulses: Dorsalis Pedis Palpable: [Left:Yes] [Right:Yes] Electronic  Signature(s) Signed: 10/27/2021 4:47:33 PM By: Shawn Pulling RN, BSN Entered By: [name] on 10/27/2021 12:01:24 -------------------------------------------------------------------------------- Multi Wound Chart Details Patient Name: Date of Service: [name], [name] 10/27/2021 11:30 A M Medical Record Number: [id number] Patient Account Number: [id number] Date of Birth/Sex: Treating RN: [birth year] (61 y.o. [name] Aleecia Tapia: [name] Other Clinician: Referring Maylin Freeburg: Small Dailin Sosnowski/Extender: [name] in Treatment: 9 Vital Signs Height(in): 69 Pulse(bpm): 77 Weight(lbs): 215 Blood Pressure(mmHg): 158/107 Body Mass Index(BMI): 31.7 Temperature(F): 98.2 Respiratory Rate(breaths/min): 18 Photos: [N/A:N/A] Right, Circumferential Lower Leg Left, Circumferential Lower Leg N/A Wound Location: Gradually Appeared Gradually Appeared N/A Wounding Event: Venous Leg Ulcer Venous Leg Ulcer N/A Primary Etiology: Congestive Heart Failure, Congestive Heart Failure,  N/A Comorbid History: Hypertension Hypertension 07/03/2021 07/03/2021 N/A Date Acquired: 9 9 N/A Weeks of Treatment: Open Open N/A Wound Status: No No N/A Wound Recurrence: 3x13x0.1 12x19x0.1 N/A Measurements L x W x D (cm) 30.631 179.071 N/A A (cm) : rea 3.063 17.907 N/A Volume (cm) : 96.70% 77.00% N/A % Reduction in Area: 96.70% 77.00% N/A % Reduction in Volume: Full Thickness With Exposed Support Full Thickness With Exposed Support N/A Classification: Structures Structures Large Large N/A Exudate A mount: Serosanguineous Serosanguineous N/A Exudate Type: red, brown red, brown N/A Exudate Color: Distinct, outline attached Distinct, outline attached N/A Wound Margin: Medium (34-66%) Small (1-33%) N/A Granulation Amount: Red, Pink Red N/A Granulation Quality: Medium (34-66%) Large (67-100%) N/A Necrotic Amount: N/A Eschar N/A Necrotic Tissue: Fascia: No Fascia: No N/A Exposed Structures: Fat Layer (Subcutaneous Tissue): No Fat Layer (Subcutaneous Tissue): No Tendon: No Tendon: No Muscle: No Muscle: No Joint: No Joint: No Bone: No Bone: No None None N/A Epithelialization: Compression Therapy Compression Therapy N/A Procedures Performed: Treatment Notes Wound #5 (Lower Leg) Wound Laterality: Right, Circumferential Cleanser Soap and Water Discharge Instruction: May shower and wash wound with dial antibacterial soap and water prior to dressing change. Peri-Wound Small Triamcinolone 15 (g) Discharge Instruction: Use triamcinolone 15 (g) as directed Sween Lotion (Moisturizing lotion) Discharge Instruction: Apply moisturizing lotion as directed Topical Primary Dressing KerraCel Ag Gelling Fiber Dressing, 4x5 in (silver alginate) Discharge Instruction: Apply silver alginate to wound bed as instructed Secondary Dressing ABD Pad, 5x9 Discharge Instruction: Apply over primary dressing as directed. Secured With Compression Wrap ThreePress (3 layer  compression wrap) Discharge Instruction: Apply three layer compression as directed. Compression Stockings Add-Ons Wound #6 (Lower Leg) Wound Laterality: Left, Circumferential Cleanser Soap and Water Discharge Instruction: May shower and wash wound with dial antibacterial soap and water prior to dressing change. Peri-Wound Small Triamcinolone 15 (g) Discharge Instruction: Use triamcinolone 15 (g) as directed Sween Lotion (Moisturizing lotion) Discharge Instruction: Apply moisturizing lotion as directed Topical Primary Dressing KerraCel Ag Gelling Fiber Dressing, 4x5 in (silver alginate) Discharge Instruction: Apply silver alginate to wound bed as instructed Secondary Dressing ABD Pad, 5x9 Discharge Instruction: Apply over primary dressing as directed. Secured With Compression Wrap ThreePress (3 layer compression wrap) Discharge Instruction: Apply three layer compression as directed. Compression Stockings Add-Ons Electronic Signature(s) Signed: 10/27/2021 4:38:07 PM By: Baltazar Najjar MD Signed: 10/27/2021 4:47:33 PM By: Shawn Pulling RN, BSN Entered By: Baltazar Najjar on 10/27/2021 12:31:45 -------------------------------------------------------------------------------- Multi-Disciplinary Small Plan Details Patient Name: Date of Service: [name], [name] 10/27/2021 11:30 A M Medical Record Number: [id number] Patient Account Number: [id number] Date of Birth/Sex: Treating RN: 1960/08/01 (61 y.o. [name] Betsy Rosello: [name] Other Clinician: Referring Jhania Etherington: Small Adithya Difrancesco/Extender: [name] in Treatment:  9 Active Inactive Wound/Skin Impairment Nursing Diagnoses: Impaired tissue integrity Knowledge deficit related to ulceration/compromised skin integrity Goals: Patient/caregiver will verbalize understanding of skin Small regimen Date Initiated: 08/25/2021 Target Resolution Date: 11/25/2021 Goal Status:  Active Ulcer/skin breakdown will have a volume reduction of 30% by week 4 Date Initiated: 08/25/2021 Target Resolution Date: 11/25/2021 Goal Status: Active Interventions: Assess patient/caregiver ability to obtain necessary supplies Assess patient/caregiver ability to perform ulcer/skin Small regimen upon admission and as needed Assess ulceration(s) every visit Notes: Electronic Signature(s) Signed: 10/27/2021 4:47:33 PM By: Shawn Pulling RN, BSN Entered By: [name] on 10/27/2021 12:08:50 -------------------------------------------------------------------------------- Pain Assessment Details Patient Name: Date of Service: [name], [name] 10/27/2021 11:30 A M Medical Record Number: [id number] Patient Account Number: [id number] Date of Birth/Sex: Treating RN: April 13, 1960 (61 y.o. [name] Ayssa Bentivegna: [name] Other Clinician: Referring Safiyah Cisney: Small Aryani Daffern/Extender: [name] in Treatment: 9 Active Problems Location of Pain Severity and Description of Pain Patient Has Paino No Site Locations Pain Management and Medication Current Pain Management: Electronic Signature(s) Signed: 10/27/2021 4:47:33 PM By: Shawn Pulling RN, BSN Entered By: [name] on 10/27/2021 12:04:02 -------------------------------------------------------------------------------- Patient/Caregiver Education Details Patient Name: Date of Service: Bobbe Medico 7/26/2023andnbsp11:30 A M Medical Record Number: [id number] Patient Account Number: [id number] Date of Birth/Gender: Treating RN: 07-19-1960 (61 y.o. [name] Physician: [name] Other Clinician: Referring Physician: Treating Physician/Extender: [name] in Treatment: 9 Education Assessment Education Provided To: Patient Education Topics Provided Wound/Skin Impairment: Methods: Explain/Verbal Responses: State content  correctly [employer]) Signed: 10/27/2021 4:47:33 PM By: Shawn Pulling RN, BSN Entered By: [name] on 10/27/2021 12:09:05 -------------------------------------------------------------------------------- Wound Assessment Details Patient Name: Date of Service: [name], [name] 10/27/2021 11:30 A M Medical Record Number: [id number] Patient Account Number: [id number] Date of Birth/Sex: Treating RN: May 07, 1960 (61 y.o. [name] Kaysan Peixoto: [name] Other Clinician: Referring Aundre Hietala: Small Alitza Cowman/Extender: [name] in Treatment: 9 Wound Status Wound Number: 5 Primary Etiology: Venous Leg Ulcer Wound Location: Right, Circumferential Lower Leg Wound Status: Open Wounding Event: Gradually Appeared Comorbid History: Congestive Heart Failure, Hypertension Date Acquired: 07/03/2021 Weeks Of Treatment: 9 Clustered Wound: No Photos Wound Measurements Length: (cm) 3 Width: (cm) 13 Depth: (cm) 0.1 Area: (cm) 30.631 Volume: (cm) 3.063 % Reduction in Area: 96.7% % Reduction in Volume: 96.7% Epithelialization: None Wound Description Classification: Full Thickness With Exposed Support Structures Wound Margin: Distinct, outline attached Exudate Amount: Large Exudate Type: Serosanguineous Exudate Color: red, brown Foul Odor After Cleansing: No Slough/Fibrino Yes Wound Bed Granulation Amount: Medium (34-66%) Exposed Structure Granulation Quality: Red, Pink Fascia Exposed: No Necrotic Amount: Medium (34-66%) Fat Layer (Subcutaneous Tissue) Exposed: No Tendon Exposed: No Muscle Exposed: No Joint Exposed: No Bone Exposed: No Treatment Notes Wound #5 (Lower Leg) Wound Laterality: Right, Circumferential Cleanser Soap and Water Discharge Instruction: May shower and wash wound with dial antibacterial soap and water prior to dressing change. Peri-Wound Small Triamcinolone 15 (g) Discharge Instruction: Use  triamcinolone 15 (g) as directed Sween Lotion (Moisturizing lotion) Discharge Instruction: Apply moisturizing lotion as directed Topical Primary Dressing KerraCel Ag Gelling Fiber Dressing, 4x5 in (silver alginate) Discharge Instruction: Apply silver alginate to wound bed as instructed Secondary Dressing ABD Pad, 5x9 Discharge Instruction: Apply over primary dressing as directed. Secured With Compression Wrap ThreePress (3 layer compression wrap) Discharge Instruction: Apply three layer compression as directed. Compression Stockings Add-Ons Electronic Signature(s) Signed: 10/27/2021 4:00:27 PM By: Thayer Dallas Signed: 10/27/2021  4:47:33 PM By: Shawn Pulling RN, BSN Entered By: Thayer Dallas on 10/27/2021 12:02:48 -------------------------------------------------------------------------------- Wound Assessment Details Patient Name: Date of Service: [name], [name] 10/27/2021 11:30 A M Medical Record Number: [id number] Patient Account Number: [id number] Date of Birth/Sex: Treating RN: 12-30-1960 (61 y.o. [name] Leolia Vinzant: [name] Other Clinician: Referring Addisson Frate: Small Jahkari Maclin/Extender: [name] in Treatment: 9 Wound Status Wound Number: 6 Primary Etiology: Venous Leg Ulcer Wound Location: Left, Circumferential Lower Leg Wound Status: Open Wounding Event: Gradually Appeared Comorbid History: Congestive Heart Failure, Hypertension Date Acquired: 07/03/2021 Weeks Of Treatment: 9 Clustered Wound: No Photos Wound Measurements Length: (cm) 12 Width: (cm) 19 Depth: (cm) 0.1 Area: (cm) 179.071 Volume: (cm) 17.907 % Reduction in Area: 77% % Reduction in Volume: 77% Epithelialization: None Tunneling: No Undermining: No Wound Description Classification: Full Thickness With Exposed Support Struct Wound Margin: Distinct, outline attached Exudate Amount: Large Exudate Type: Serosanguineous Exudate Color:  red, brown ures Wound Bed Granulation Amount: Small (1-33%) Exposed Structure Granulation Quality: Red Fascia Exposed: No Necrotic Amount: Large (67-100%) Fat Layer (Subcutaneous Tissue) Exposed: No Necrotic Quality: Eschar Tendon Exposed: No Muscle Exposed: No Joint Exposed: No Bone Exposed: No Treatment Notes Wound #6 (Lower Leg) Wound Laterality: Left, Circumferential Cleanser Soap and Water Discharge Instruction: May shower and wash wound with dial antibacterial soap and water prior to dressing change. Peri-Wound Small Triamcinolone 15 (g) Discharge Instruction: Use triamcinolone 15 (g) as directed Sween Lotion (Moisturizing lotion) Discharge Instruction: Apply moisturizing lotion as directed Topical Primary Dressing KerraCel Ag Gelling Fiber Dressing, 4x5 in (silver alginate) Discharge Instruction: Apply silver alginate to wound bed as instructed Secondary Dressing ABD Pad, 5x9 Discharge Instruction: Apply over primary dressing as directed. Secured With Compression Wrap ThreePress (3 layer compression wrap) Discharge Instruction: Apply three layer compression as directed. Compression Stockings Add-Ons Electronic Signature(s) Signed: 10/27/2021 4:00:27 PM By: Thayer Dallas Signed: 10/27/2021 4:47:33 PM By: Shawn Pulling RN, BSN Entered By: Thayer Dallas on 10/27/2021 12:01:43 -------------------------------------------------------------------------------- Vitals Details Patient Name: Date of Service: [name], [name] 10/27/2021 11:30 A M Medical Record Number: [id number] Patient Account Number: [id number] Date of Birth/Sex: Treating RN: Mar 12, 1961 (61 y.o. [name] Katielynn Horan: [name] Other Clinician: Referring Jahmez Bily: Small Zanyah Lentsch/Extender: [name] in Treatment: 9 Vital Signs Time Taken: 12:03 Temperature (F): 98.2 Height (in): 69 Pulse (bpm): 77 Weight (lbs): 215 Respiratory Rate  (breaths/min): 18 Body Mass Index (BMI): 31.7 Blood Pressure (mmHg): 158/107 Reference Range: 80 - 120 mg / dl Electronic Signature(s) Signed: 10/27/2021 4:47:33 PM By: Shawn Pulling RN, BSN Entered By: [name] on 10/27/2021 12:03:54

## 2021-10-27 NOTE — Progress Notes (Signed)
[name], [name] ([id number]) Visit Report for 10/27/2021 HPI Details Patient Name: Date of Service: [name], [name] 10/27/2021 11:30 A M Medical Record Number: [id number] Patient Account Number: [id number] Date of Birth/Sex: Treating RN: [birth year] (61 y.o. Mare Ferrari Primary Care Provider: [name] Other Clinician: Referring Provider: Treating Provider/Extender: Maxwell Marion in Treatment: 9 History of Present Illness HPI Description: 02/24/2020 on evaluation today patient presents with obvious chronic lymphedema of the bilateral lower extremities. Fortunately there is no signs of active infection at this time which is great news. With that being said the patient does have a recent treatment with doxycycline 100 mg for 10 days which he has completed. He does have a history of chronic kidney disease stage III as documented in his chart. He also has chronic venous insufficiency with lower extremity lymphedema noted. He has congestive heart failure and hypertension. He tells me currently that he was unaware of the kidney disease before he mentioned this today. He also tells me that his primary care provider had told him to quit taking showers based on what was going on with his legs at this time. Fortunately there is no signs of active infection at this time which is great news. No fevers, chills, nausea, vomiting, or diarrhea. 03/04/2020 on evaluation today patient actually appears to be doing much better in regard to his legs. There is really not much draining a lot of the dry skin is starting to loosen up amount of time to get as much of this off today as possible 03/11/2020 upon evaluation today patient appears to be doing well currently in regard to his legs. He still has a lot of dry skin some which I was able to get off today but overall I think that the biggest thing he needs right now is to be able to wash his legs in the shower to loosen some of this up which I  think will then come off much more effectively and quickly without as much of an issue. Fortunately there is no evidence of active infection at this time. No fevers, chills, nausea, vomiting, or diarrhea. Readmission: 06/05/2020 upon evaluation today patient appears to be doing worse in regard to his bilateral lower extremities. Unfortunately he has had some issues here with his leg started to swell again he tells me he was not able to wear the compression stockings that he got from elastic therapy. He states when he called the leg he said that the measurements did not seem right and subsequently just sent him something she thought would work. Either way he tells me they were very tight and very uncomfortable he just was not able to stick with it. Fortunately there is no signs of active infection at this time. No fevers, chills, nausea, vomiting, or diarrhea. 06/12/2020 on evaluation today patient actually appears to be doing well in regard to his legs. He has a lot of dry skin but nothing that appears to be open at this point. He did not get the Velcro compression wraps but does have the pull up compression stockings which if he wears those can definitely be sufficient here. Readmission: 08-25-2021 upon evaluation today patient appears to be doing well with regard to his legs all things considered. With that being said I do not see anything that actively looks like it is open or draining but I definitely see spots where he had blistering and obvious signs of drainage. Subsequently he is supposed to be having open heart surgery to  replace a valve that sounds like but they are not able to do that simply due to the fact that right now he is having more issues currently with his legs and they will not do anything until that is resolved. Fortunately I do not see any evidence of active infection locally or systemically which is great news. No fevers, chills, nausea, vomiting, or diarrhea. The patient does  have a history noted previously of lymphedema, chronic venous hypertension, chronic kidney disease stage III, congestive heart failure, and hypertension. 09-01-2021 upon evaluation today patient actually appears to be healed I do not see anything open at this time which is great news. Fortunately he is doing excellent and I think they were on the right track. With that being said there were a couple areas where he was so dry that the wrap actually stuck to his leg and this has caused some issues here with a couple small openings remaining but I think by next week these will be sealed up. Overall I think that he needs some lotion twice a day and Ace wraps are probably to be the best way to go to help with compression for now. He does have the vascular appointment upcoming in a couple of days. 6/7; patient comes in today with quite a deterioration from last week. Coupled with this is that he had his arterial studies which were really not very good on the right his ABI was 0.92 and on the left 1.09 but TBI was 0.52 on the right and 0.36 on the left he had monophasic waveforms almost diffusely on the right with an estimated 50 to 74% stenosis of the distal SFA on the left he had similar findings. 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 We are working on getting him a vascular evaluation./Consultation Also looking through his chart he has a lot of other medical problems including severe aortic stenosis and he has been referred to cardiac surgery. He has atrial fibrillation on Eliquis 09-15-2021 upon evaluation today patient appears to be doing better than it sounds like he was last week. Fortunately I do not see any evidence of active infection locally or systemically which is great news. No fevers, chills, nausea, vomiting, or diarrhea. He does have  an appointment with vascular tomorrow. 09-22-2021 upon evaluation today patient appears to be doing well with regard to his legs all things considered. I did review his note and it appears that he has significant peripheral vascular disease bilaterally in the lower extremities. Subsequently this seems to be even a femoral and iliac arteries. Subsequently I do believe that the arteriogram is probably going to be a good way to go as far as trying to improve his blood flow as noted in the record from Dr. Waverly Ferrari on 09-16-2021. 09-29-2021 upon evaluation today patient appears to be doing decently well today in regard to his wounds on the legs. Things are actually showing signs of good improvement and I am very pleased in that regard. I do not see any signs of infection at this time which is great news and in fact everything is pretty dry today. He does have his arteriogram with Dr. Durwin Nora on Friday. With that being said once that is complete I really think things will be significantly improved and hopefully he should be able to not only feel better but also continue  with his own compression socks. 10-06-2021 upon evaluation today patient appears to be doing well with regard to his legs in fact there is very little open at this point. Fortunately I do not see any signs of active infection which is great news. No fevers, chills, nausea, vomiting, or diarrhea. 10-13-2021 upon evaluation today patient's legs actually appear to be doing decently well. I am really seeing not much in the way of drainage at this point. Fortunately I think that he is on the right track and I believe that we are very close to complete resolution and discharge and at this point I do think that he is tolerating the compression wraps without complication. He has his appointment with vascular coming up on the 21st of this month which is just a week and a half away. 10-20-2021 upon evaluation today patient appears to be doing well  currently in regard to his legs. There is very little open or draining at this point he actually has his procedure with vascular on Friday. Hopefully he will have much better blood flow following this procedure. 7/26; patient tells me that he went for revascularization on the left leg with an apparent stent but I did not look over this and care everywhere today. He arrives in clinic today with small wounds on the right lateral right medial leg more impressive areas on the left medial and left posterior. We have been using silver alginate and kerlix Coban. Electronic Signature(s) Signed: 10/27/2021 4:38:07 PM By: Linton Ham MD Entered By: Linton Ham on 10/27/2021 12:37:23 -------------------------------------------------------------------------------- Physical Exam Details Patient Name: Date of Service: [name], [name] 10/27/2021 11:30 A M Medical Record Number: [id number] Patient Account Number: [id number] Date of Birth/Sex: Treating RN: Jul 10, 1960 (61 y.o. Mare Ferrari Primary Care Provider: [name] Other Clinician: Referring Provider: Treating Provider/Extender: Maxwell Marion in Treatment: 9 Constitutional Patient is hypotensive.. Pulse regular and within target range for patient.Marland Kitchen Respirations regular, non-labored and within target range.. Temperature is normal and within the target range for the patient.Marland Kitchen Appears in no distress. Cardiovascular Dorsalis pedis pulses are reduced but palpable even on the left. Very dry flaking skin bilaterally with most of the wound loss on the left medial lower leg. Notes Wound exam; 2 small open areas right lateral right medial more significant on the left medial and left posterior calf. Nothing needed debridement. Skin is very dry and flaking diffusely on his lower legs Electronic Signature(s) Signed: 10/27/2021 4:38:07 PM By: Linton Ham MD Entered By: Linton Ham on 10/27/2021  12:39:14 -------------------------------------------------------------------------------- Physician Orders Details Patient Name: Date of Service: [name], [name] 10/27/2021 11:30 A M Medical Record Number: [id number] Patient Account Number: [id number] Date of Birth/Sex: Treating RN: [birth year] (61 y.o. Mare Ferrari Primary Care Provider: [name] Other Clinician: Referring Provider: Treating Provider/Extender: Maxwell Marion in Treatment: 9 Verbal / Phone Orders: No Diagnosis Coding Follow-up Appointments ppointment in 1 week. Jeri Cos, PA and [facility] 9 Return A Other: - Arteriography 10/22/21 w/ Dr. Scot Dock Bathing/ Shower/ Hygiene May shower with protection but do not get wound dressing(s) wet. Edema Control - Lymphedema / SCD / Other Elevate legs to the level of the heart or above for 30 minutes daily and/or when sitting, a frequency of: Avoid standing for long periods of time. Wound Treatment Wound #5 - Lower Leg Wound Laterality: Right, Circumferential Cleanser: Soap and Water 2 x Per Week/7 Days Discharge Instructions: May shower and wash wound with dial antibacterial soap  and water prior to dressing change. Peri-Wound Care: Triamcinolone 15 (g) 2 x Per Week/7 Days Discharge Instructions: Use triamcinolone 15 (g) as directed Peri-Wound Care: Sween Lotion (Moisturizing lotion) 2 x Per Week/7 Days Discharge Instructions: Apply moisturizing lotion as directed Prim Dressing: KerraCel Ag Gelling Fiber Dressing, 4x5 in (silver alginate) 2 x Per Week/7 Days ary Discharge Instructions: Apply silver alginate to wound bed as instructed Secondary Dressing: ABD Pad, 5x9 2 x Per Week/7 Days Discharge Instructions: Apply over primary dressing as directed. Compression Wrap: ThreePress (3 layer compression wrap) 2 x Per Week/7 Days Discharge Instructions: Apply three layer compression as directed. Wound #6 - Lower Leg Wound Laterality: Left,  Circumferential Cleanser: Soap and Water 2 x Per Week/7 Days Discharge Instructions: May shower and wash wound with dial antibacterial soap and water prior to dressing change. Peri-Wound Care: Triamcinolone 15 (g) 2 x Per Week/7 Days Discharge Instructions: Use triamcinolone 15 (g) as directed Peri-Wound Care: Sween Lotion (Moisturizing lotion) 2 x Per Week/7 Days Discharge Instructions: Apply moisturizing lotion as directed Prim Dressing: KerraCel Ag Gelling Fiber Dressing, 4x5 in (silver alginate) 2 x Per Week/7 Days ary Discharge Instructions: Apply silver alginate to wound bed as instructed Secondary Dressing: ABD Pad, 5x9 2 x Per Week/7 Days Discharge Instructions: Apply over primary dressing as directed. Compression Wrap: ThreePress (3 layer compression wrap) 2 x Per Week/7 Days Discharge Instructions: Apply three layer compression as directed. Electronic Signature(s) Signed: 10/27/2021 4:38:07 PM By: Linton Ham MD Signed: 10/27/2021 4:47:33 PM By: Sharyn Creamer RN, BSN Entered By: Sharyn Creamer on 10/27/2021 12:15:01 -------------------------------------------------------------------------------- Problem List Details Patient Name: Date of Service: [name], [name] 10/27/2021 11:30 A M Medical Record Number: [id number] Patient Account Number: [id number] Date of Birth/Sex: Treating RN: January 12, 1961 (61 y.o. Mare Ferrari Primary Care Provider: [name] Other Clinician: Referring Provider: Treating Provider/Extender: Maxwell Marion in Treatment: 9 Active Problems ICD-10 Encounter Code Description Active Date MDM Diagnosis I89.0 Lymphedema, not elsewhere classified 08/25/2021 No Yes I87.333 Chronic venous hypertension (idiopathic) with ulcer and inflammation of 08/25/2021 No Yes bilateral lower extremity L97.822 Non-pressure chronic ulcer of other part of left lower leg with fat layer exposed5/24/2023 No Yes L97.812 Non-pressure chronic ulcer  of other part of right lower leg with fat layer 08/25/2021 No Yes exposed N18.30 Chronic kidney disease, stage 3 unspecified 08/25/2021 No Yes I50.42 Chronic combined systolic (congestive) and diastolic (congestive) heart failure 08/25/2021 No Yes I10 Essential (primary) hypertension 08/25/2021 No Yes 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 Mare Ferrari Primary Care Provider: [name] Other Clinician: Referring Provider: Treating Provider/Extender: Maxwell Marion in Treatment: 9 Subjective History of Present Illness (HPI) 02/24/2020 on evaluation today patient presents with obvious chronic lymphedema of the bilateral lower extremities. Fortunately there is no signs of active infection at this time which is great news. With that being said the patient does have a recent treatment with doxycycline 100 mg for 10 days which he has completed. He does have a history of chronic kidney disease stage III  as documented in his chart. He also has chronic venous insufficiency with lower extremity lymphedema noted. He has congestive heart failure and hypertension. He tells me currently that he was unaware of the kidney disease before he mentioned this today. He also tells me that his primary care provider had told him to quit taking showers based on what was  going on with his legs at this time. Fortunately there is no signs of active infection at this time which is great news. No fevers, chills, nausea, vomiting, or diarrhea. 03/04/2020 on evaluation today patient actually appears to be doing much better in regard to his legs. There is really not much draining a lot of the dry skin is starting to loosen up amount of time to get as much of this off today as possible 03/11/2020 upon evaluation today patient appears to be doing well currently in regard to his legs. He still has a lot of dry skin some which I was able to get off today but overall I think that the biggest thing he needs right now is to be able to wash his legs in the shower to loosen some of this up which I think will then come off much more effectively and quickly without as much of an issue. Fortunately there is no evidence of active infection at this time. No fevers, chills, nausea, vomiting, or diarrhea. Readmission: 06/05/2020 upon evaluation today patient appears to be doing worse in regard to his bilateral lower extremities. Unfortunately he has had some issues here with his leg started to swell again he tells me he was not able to wear the compression stockings that he got from elastic therapy. He states when he called the leg he said that the measurements did not seem right and subsequently just sent him something she thought would work. Either way he tells me they were very tight and very uncomfortable he just was not able to stick with it. Fortunately there is no signs of active infection at this time. No fevers, chills, nausea, vomiting, or diarrhea. 06/12/2020 on evaluation today patient actually appears to be doing well in regard to his legs. He has a lot of dry skin but nothing that appears to be open at this point. He did not get the Velcro compression wraps but does have the pull up compression stockings which if he wears those can definitely be sufficient  here. Readmission: 08-25-2021 upon evaluation today patient appears to be doing well with regard to his legs all things considered. With that being said I do not see anything that actively looks like it is open or draining but I definitely see spots where he had blistering and obvious signs of drainage. Subsequently he is supposed to be having open heart surgery to replace a valve that sounds like but they are not able to do that simply due to the fact that right now he is having more issues currently with his legs and they will not do anything until that is resolved. Fortunately I do not see any evidence of active infection locally or systemically which is great news. No fevers, chills, nausea, vomiting, or diarrhea. The patient does have a history noted previously of lymphedema, chronic venous hypertension, chronic kidney disease stage III, congestive heart failure, and hypertension. 09-01-2021 upon evaluation today patient actually appears to be healed I do not see anything open at this time which is great news. Fortunately he is doing excellent and I think they were on the right track. With that  being said there were a couple areas where he was so dry that the wrap actually stuck to his leg and this has caused some issues here with a couple small openings remaining but I think by next week these will be sealed up. Overall I think that he needs some lotion twice a day and Ace wraps are probably to be the best way to go to help with compression for now. He does have the vascular appointment upcoming in a couple of days. 6/7; patient comes in today with quite a deterioration from last week. Coupled with this is that he had his arterial studies which were really not very good on the right his ABI was 0.92 and on the left 1.09 but TBI was 0.52 on the right and 0.36 on the left he had monophasic waveforms almost diffusely on the right with an estimated 50 to 74% stenosis of the distal SFA on the left he  had similar findings. 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 We are working on getting him a vascular evaluation./Consultation Also looking through his chart he has a lot of other medical problems including severe aortic stenosis and he has been referred to cardiac surgery. He has atrial fibrillation on Eliquis 09-15-2021 upon evaluation today patient appears to be doing better than it sounds like he was last week. Fortunately I do not see any evidence of active infection locally or systemically which is great news. No fevers, chills, nausea, vomiting, or diarrhea. He does have an appointment with vascular tomorrow. 09-22-2021 upon evaluation today patient appears to be doing well with regard to his legs all things considered. I did review his note and it appears that he has significant peripheral vascular disease bilaterally in the lower extremities. Subsequently this seems to be even a femoral and iliac arteries. Subsequently I do believe that the arteriogram is probably going to be a good way to go as far as trying to improve his blood flow as noted in the record from Dr. Waverly Ferrari on 09-16-2021. 09-29-2021 upon evaluation today patient appears to be doing decently well today in regard to his wounds on the legs. Things are actually showing signs of good improvement and I am very pleased in that regard. I do not see any signs of infection at this time which is great news and in fact everything is pretty dry today. He does have his arteriogram with Dr. Durwin Nora on Friday. With that being said once that is complete I really think things will be significantly improved and hopefully he should be able to not only feel better but also continue with his own compression socks. 10-06-2021 upon evaluation today patient appears to be doing well with  regard to his legs in fact there is very little open at this point. Fortunately I do not see any signs of active infection which is great news. No fevers, chills, nausea, vomiting, or diarrhea. 10-13-2021 upon evaluation today patient's legs actually appear to be doing decently well. I am really seeing not much in the way of drainage at this point. Fortunately I think that he is on the right track and I believe that we are very close to complete resolution and discharge and at this point I do think that he is tolerating the compression wraps without complication. He has his appointment with vascular coming up on the  21st of this month which is just a week and a half away. 10-20-2021 upon evaluation today patient appears to be doing well currently in regard to his legs. There is very little open or draining at this point he actually has his procedure with vascular on Friday. Hopefully he will have much better blood flow following this procedure. 7/26; patient tells me that he went for revascularization on the left leg with an apparent stent but I did not look over this and care everywhere today. He arrives in clinic today with small wounds on the right lateral right medial leg more impressive areas on the left medial and left posterior. We have been using silver alginate and kerlix Coban. Objective Constitutional Patient is hypotensive.. Pulse regular and within target range for patient.Marland Kitchen Respirations regular, non-labored and within target range.. Temperature is normal and within the target range for the patient.Marland Kitchen Appears in no distress. Vitals Time Taken: 12:03 PM, Height: 69 in, Weight: 215 lbs, BMI: 31.7, Temperature: 98.2 F, Pulse: 77 bpm, Respiratory Rate: 18 breaths/min, Blood Pressure: 158/107 mmHg. Cardiovascular Dorsalis pedis pulses are reduced but palpable even on the left. Very dry flaking skin bilaterally with most of the wound loss on the left medial lower leg. General Notes: Wound  exam; 2 small open areas right lateral right medial more significant on the left medial and left posterior calf. Nothing needed debridement. Skin is very dry and flaking diffusely on his lower legs Integumentary (Hair, Skin) Wound #5 status is Open. Original cause of wound was Gradually Appeared. The date acquired was: 07/03/2021. The wound has been in treatment 9 weeks. The wound is located on the Right,Circumferential Lower Leg. The wound measures 3cm length x 13cm width x 0.1cm depth; 30.631cm^2 area and 3.063cm^3 volume. There is a large amount of serosanguineous drainage noted. The wound margin is distinct with the outline attached to the wound base. There is medium (34-66%) red, pink granulation within the wound bed. There is a medium (34-66%) amount of necrotic tissue within the wound bed. Wound #6 status is Open. Original cause of wound was Gradually Appeared. The date acquired was: 07/03/2021. The wound has been in treatment 9 weeks. The wound is located on the Left,Circumferential Lower Leg. The wound measures 12cm length x 19cm width x 0.1cm depth; 179.071cm^2 area and 17.907cm^3 volume. There is no tunneling or undermining noted. There is a large amount of serosanguineous drainage noted. The wound margin is distinct with the outline attached to the wound base. There is small (1-33%) red granulation within the wound bed. There is a large (67-100%) amount of necrotic tissue within the wound bed including Eschar. 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 There was a Three Layer Compression Therapy Procedure by Sharyn Creamer, RN. Post procedure Diagnosis Wound #5: Same as Pre-Procedure Wound #6 Pre-procedure diagnosis of Wound #6 is a Venous Leg Ulcer located on the Left,Circumferential Lower  Leg . There was a Three Layer Compression Therapy Procedure by Sharyn Creamer, RN. Post procedure Diagnosis Wound #6: Same as Pre-Procedure Plan Follow-up Appointments: Return Appointment in 1 week. Jeri Cos, PA and [facility] Other: - Arteriography 10/22/21 w/ Dr. Chalmers Guest Shower/ Hygiene: May shower with protection but do not get wound dressing(s) wet. Edema Control - Lymphedema / SCD / Other: Elevate legs to the level of the heart or above for 30 minutes daily and/or when sitting, a frequency of: Avoid standing for long periods of time. WOUND #5: - Lower Leg Wound Laterality: Right, Circumferential Cleanser: Soap and Water 2 x Per Week/7 Days Discharge Instructions: May shower and wash wound with dial antibacterial soap and water prior to dressing change. Peri-Wound Care: Triamcinolone 15 (g) 2 x Per Week/7 Days Discharge Instructions: Use triamcinolone 15 (g) as directed Peri-Wound Care: Sween Lotion (Moisturizing lotion) 2 x Per Week/7 Days Discharge Instructions: Apply moisturizing lotion as directed Prim Dressing: KerraCel Ag Gelling Fiber Dressing, 4x5 in (silver alginate) 2 x Per Week/7 Days ary Discharge Instructions: Apply silver alginate to wound bed as instructed Secondary Dressing: ABD Pad, 5x9 2 x Per Week/7 Days Discharge Instructions: Apply over primary dressing as directed. Com pression Wrap: ThreePress (3 layer  compression wrap) 2 x Per Week/7 Days Discharge Instructions: Apply three layer compression as directed. WOUND #6: - Lower Leg Wound Laterality: Left, Circumferential Cleanser: Soap and Water 2 x Per Week/7 Days Discharge Instructions: May shower and wash wound with dial antibacterial soap and water prior to dressing change. Peri-Wound Care: Triamcinolone 15 (g) 2 x Per Week/7 Days Discharge Instructions: Use triamcinolone 15 (g) as directed Peri-Wound Care: Sween Lotion (Moisturizing lotion) 2 x Per Week/7 Days Discharge Instructions: Apply moisturizing lotion as directed Prim Dressing: KerraCel Ag Gelling Fiber Dressing, 4x5 in (silver alginate) 2 x Per Week/7 Days ary Discharge Instructions: Apply silver alginate to wound bed as instructed Secondary Dressing: ABD Pad, 5x9 2 x Per Week/7 Days Discharge Instructions: Apply over primary dressing as directed. Com pression Wrap: ThreePress (3 layer compression wrap) 2 x Per Week/7 Days Discharge Instructions: Apply three layer compression as directed. 1. TCA moisturizer to his skin bilaterally. Silver alginate ABDs and I have increased him to 3 layer compression on both sides 2. He has been revascularized on the left is pedal pulses palpable. We did not recheck his ABI Electronic Signature(s) Signed: 10/27/2021 4:38:07 PM By: Linton Ham MD Entered By: Linton Ham on 10/27/2021 12:41:52 -------------------------------------------------------------------------------- SuperBill Details Patient Name: Date of Service: [name], [name] 10/27/2021 Medical Record Number: [id number] Patient Account Number: [id number] Date of Birth/Sex: Treating RN: 02/28/1961 (61 y.o. 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 L97.822 Non-pressure chronic ulcer of other part of left lower leg with fat layer exposed L97.812 Non-pressure chronic ulcer of other part of right lower leg with fat layer exposed N18.30 Chronic kidney disease, stage 3 unspecified I50.42 Chronic combined systolic (congestive) and diastolic (congestive) heart failure I10 Essential (primary) hypertension 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 ICD-10 Diagnosis Description I89.0 Lymphedema, not elsewhere classified I87.333 Chronic venous hypertension (idiopathic) with ulcer  and inflammation of bilateral lower ext L97.812 Non-pressure chronic ulcer of other part of right lower leg with fat layer exposed L97.822 Non-pressure chronic ulcer of other part of left lower leg with fat layer exposed Modifier: remity Quantity: 1 Physician Procedures Electronic Signature(s) Signed: 10/27/2021 4:38:07 PM By: Linton Ham MD Entered By: Linton Ham on 10/27/2021 12:42:12

## 2021-10-29 ENCOUNTER — Other Ambulatory Visit: Payer: Self-pay

## 2021-10-29 DIAGNOSIS — Z79899 Other long term (current) drug therapy: Secondary | ICD-10-CM

## 2021-11-03 ENCOUNTER — Encounter (HOSPITAL_BASED_OUTPATIENT_CLINIC_OR_DEPARTMENT_OTHER): Payer: Medicare Other | Attending: Physician Assistant | Admitting: Physician Assistant

## 2021-11-03 DIAGNOSIS — Z7901 Long term (current) use of anticoagulants: Secondary | ICD-10-CM | POA: Insufficient documentation

## 2021-11-03 DIAGNOSIS — I70248 Atherosclerosis of native arteries of left leg with ulceration of other part of lower left leg: Secondary | ICD-10-CM | POA: Diagnosis not present

## 2021-11-03 DIAGNOSIS — L97812 Non-pressure chronic ulcer of other part of right lower leg with fat layer exposed: Secondary | ICD-10-CM | POA: Insufficient documentation

## 2021-11-03 DIAGNOSIS — N183 Chronic kidney disease, stage 3 unspecified: Secondary | ICD-10-CM | POA: Insufficient documentation

## 2021-11-03 DIAGNOSIS — I890 Lymphedema, not elsewhere classified: Secondary | ICD-10-CM | POA: Insufficient documentation

## 2021-11-03 DIAGNOSIS — I509 Heart failure, unspecified: Secondary | ICD-10-CM | POA: Insufficient documentation

## 2021-11-03 DIAGNOSIS — I5042 Chronic combined systolic (congestive) and diastolic (congestive) heart failure: Secondary | ICD-10-CM | POA: Insufficient documentation

## 2021-11-03 DIAGNOSIS — I87333 Chronic venous hypertension (idiopathic) with ulcer and inflammation of bilateral lower extremity: Secondary | ICD-10-CM | POA: Insufficient documentation

## 2021-11-03 DIAGNOSIS — L97822 Non-pressure chronic ulcer of other part of left lower leg with fat layer exposed: Secondary | ICD-10-CM | POA: Insufficient documentation

## 2021-11-03 DIAGNOSIS — I350 Nonrheumatic aortic (valve) stenosis: Secondary | ICD-10-CM | POA: Diagnosis not present

## 2021-11-03 DIAGNOSIS — I872 Venous insufficiency (chronic) (peripheral): Secondary | ICD-10-CM | POA: Diagnosis not present

## 2021-11-03 DIAGNOSIS — I130 Hypertensive heart and chronic kidney disease with heart failure and stage 1 through stage 4 chronic kidney disease, or unspecified chronic kidney disease: Secondary | ICD-10-CM | POA: Diagnosis not present

## 2021-11-03 NOTE — Progress Notes (Addendum)
[name], [name] ([id number]) Visit Report for 11/03/2021 Chief Complaint Document Details Patient Name: Date of Service: [name], [name] 11/03/2021 11:30 A M Medical Record Number: [id number] Patient Account Number: [id number] Date of Birth/Sex: Treating RN: 02-Oct-1960 (61 y.o. M) Primary Care Provider: [name] Other Clinician: Referring Provider: Treating Provider/Extender: [name] in Treatment: 10 Information Obtained from: Patient Chief Complaint Bilateral Leg Ulcers Electronic Signature(s) Signed: 11/03/2021 11:29:47 AM By: Lenda Kelp PA-C Entered By: Lenda Kelp on 11/03/2021 11:29:46 -------------------------------------------------------------------------------- HPI Details Patient Name: Date of Service: [name], [name] 11/03/2021 11:30 A M Medical Record Number: [id number] Patient Account Number: [id number] Date of Birth/Sex: Treating RN: Jul 26, 1960 (61 y.o. M) Primary Care Provider: [name] Other Clinician: Referring Provider: Treating Provider/Extender: [name] in Treatment: 10 History of Present Illness HPI Description: 02/24/2020 on evaluation today patient presents with obvious chronic lymphedema of the bilateral lower extremities. Fortunately there is no signs of active infection at this time which is great news. With that being said the patient does have a recent treatment with doxycycline 100 mg for 10 days which he has completed. He does have a history of chronic kidney disease stage III as documented in his chart. He also has chronic venous insufficiency with lower extremity lymphedema noted. He has congestive heart failure and hypertension. He tells me currently that he was unaware of the kidney disease before he mentioned this today. He also tells me that his primary care provider had told him to quit taking showers based on what was going on with his legs at this time. Fortunately there is no signs  of active infection at this time which is great news. No fevers, chills, nausea, vomiting, or diarrhea. 03/04/2020 on evaluation today patient actually appears to be doing much better in regard to his legs. There is really not much draining a lot of the dry skin is starting to loosen up amount of time to get as much of this off today as possible 03/11/2020 upon evaluation today patient appears to be doing well currently in regard to his legs. He still has a lot of dry skin some which I was able to get off today but overall I think that the biggest thing he needs right now is to be able to wash his legs in the shower to loosen some of this up which I think will then come off much more effectively and quickly without as much of an issue. Fortunately there is no evidence of active infection at this time. No fevers, chills, nausea, vomiting, or diarrhea. Readmission: 06/05/2020 upon evaluation today patient appears to be doing worse in regard to his bilateral lower extremities. Unfortunately he has had some issues here with his leg started to swell again he tells me he was not able to wear the compression stockings that he got from elastic therapy. He states when he called the leg he said that the measurements did not seem right and subsequently just sent him something she thought would work. Either way he tells me they were very tight and very uncomfortable he just was not able to stick with it. Fortunately there is no signs of active infection at this time. No fevers, chills, nausea, vomiting, or diarrhea. 06/12/2020 on evaluation today patient actually appears to be doing well in regard to his legs. He has a lot of dry skin but nothing that appears to be open at this point. He did not get the Velcro compression  wraps but does have the pull up compression stockings which if he wears those can definitely be sufficient here. Readmission: 08-25-2021 upon evaluation today patient appears to be doing well with  regard to his legs all things considered. With that being said I do not see anything that actively looks like it is open or draining but I definitely see spots where he had blistering and obvious signs of drainage. Subsequently he is supposed to be having open heart surgery to replace a valve that sounds like but they are not able to do that simply due to the fact that right now he is having more issues currently with his legs and they will not do anything until that is resolved. Fortunately I do not see any evidence of active infection locally or systemically which is great news. No fevers, chills, nausea, vomiting, or diarrhea. The patient does have a history noted previously of lymphedema, chronic venous hypertension, chronic kidney disease stage III, congestive heart failure, and hypertension. 09-01-2021 upon evaluation today patient actually appears to be healed I do not see anything open at this time which is great news. Fortunately he is doing excellent and I think they were on the right track. With that being said there were a couple areas where he was so dry that the wrap actually stuck to his leg and this has caused some issues here with a couple small openings remaining but I think by next week these will be sealed up. Overall I think that he needs some lotion twice a day and Ace wraps are probably to be the best way to go to help with compression for now. He does have the vascular appointment upcoming in a couple of days. 6/7; patient comes in today with quite a deterioration from last week. Coupled with this is that he had his arterial studies which were really not very good on the right his ABI was 0.92 and on the left 1.09 but TBI was 0.52 on the right and 0.36 on the left he had monophasic waveforms almost diffusely on the right with an estimated 50 to 74% stenosis of the distal SFA on the left he had similar findings. 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 We are working on getting him a vascular evaluation./Consultation Also looking through his chart he has a lot of other medical problems including severe aortic stenosis and he has been referred to cardiac surgery. He has atrial fibrillation on Eliquis 09-15-2021 upon evaluation today patient appears to be doing better than it sounds like he was last week. Fortunately I do not see any evidence of active infection locally or systemically which is great news. No fevers, chills, nausea, vomiting, or diarrhea. He does have an appointment with vascular tomorrow. 09-22-2021 upon evaluation today patient appears to be doing well with regard to his legs all things considered. I did review his note and it appears that he has significant peripheral vascular disease bilaterally in the lower extremities. Subsequently this seems to be even a femoral and iliac arteries. Subsequently I do believe that the arteriogram is probably going to be a good way to go as far as trying to improve his blood flow as noted in the record from Dr. Deitra Mayo on 09-16-2021. 09-29-2021 upon evaluation today patient appears to be doing decently well today in regard to his wounds on  the legs. Things are actually showing signs of good improvement and I am very pleased in that regard. I do not see any signs of infection at this time which is great news and in fact everything is pretty dry today. He does have his arteriogram with Dr. Doren Custard on Friday. With that being said once that is complete I really think things will be significantly improved and hopefully he should be able to not only feel better but also continue with his own compression socks. 10-06-2021 upon evaluation today patient appears to be doing well with regard to his legs in fact there is very little open at this point. Fortunately I do  not see any signs of active infection which is great news. No fevers, chills, nausea, vomiting, or diarrhea. 10-13-2021 upon evaluation today patient's legs actually appear to be doing decently well. I am really seeing not much in the way of drainage at this point. Fortunately I think that he is on the right track and I believe that we are very close to complete resolution and discharge and at this point I do think that he is tolerating the compression wraps without complication. He has his appointment with vascular coming up on the 21st of this month which is just a week and a half away. 10-20-2021 upon evaluation today patient appears to be doing well currently in regard to his legs. There is very little open or draining at this point he actually has his procedure with vascular on Friday. Hopefully he will have much better blood flow following this procedure. 7/26; patient tells me that he went for revascularization on the left leg with an apparent stent but I did not look over this and care everywhere today. He arrives in clinic today with small wounds on the right lateral right medial leg more impressive areas on the left medial and left posterior. We have been using silver alginate and kerlix Coban. 11-03-2021 patient appears to be doing much better in regard to his legs at this point. He has been revascularized and seems to be doing excellent as far as that is concerned. Fortunately I see no evidence of active infection at this time which is great news. No fevers, chills, nausea, vomiting, or diarrhea. Electronic Signature(s) Signed: 11/03/2021 1:06:19 PM By: [name] PA-C Entered By: [name] on 11/03/2021 13:06:19 -------------------------------------------------------------------------------- Physical Exam Details Patient Name: Date of Service: [name], [name] 11/03/2021 11:30 A M Medical Record Number: [id number] Patient Account Number: [id number] Date of Birth/Sex: Treating  RN: 1960/10/17 (61 y.o. M) Primary Care Provider: [name] Other Clinician: Referring Provider: Treating Provider/Extender: [name] in Treatment: 88 Constitutional Well-nourished and well-hydrated in no acute distress. Respiratory normal breathing without difficulty. Psychiatric this patient is able to make decisions and demonstrates good insight into disease process. Alert and Oriented x 3. pleasant and cooperative. Notes Upon inspection patient's legs actually are showing signs of significant improvement I do believe the triamcinolone is really helping to loosen up a lot of the dry dead skin which is improving the overall appearance of his legs and with improved blood flow he is overall doing much better. Electronic Signature(s) Signed: 11/03/2021 1:06:34 PM By: [name] PA-C Entered By: [name] on 11/03/2021 13:06:34 -------------------------------------------------------------------------------- Physician Orders Details Patient Name: Date of Service: KHYRI, BARCO 11/03/2021 11:30 [place] Number: [id number] Patient Account Number: [id number] Date of Birth/Sex: Treating RN: [birth year] (60 y.o. Janyth Contes Primary Care Provider:  [name] Other Clinician: Referring Provider: Treating Provider/Extender: [name] in Treatment: 10 Verbal / Phone Orders: No Diagnosis Coding ICD-10 Coding Code Description I89.0 Lymphedema, not elsewhere classified I87.333 Chronic venous hypertension (idiopathic) with ulcer and inflammation of bilateral lower extremity L97.822 Non-pressure chronic ulcer of other part of left lower leg with fat layer exposed L97.812 Non-pressure chronic ulcer of other part of right lower leg with fat layer exposed N18.30 Chronic kidney disease, stage 3 unspecified I50.42 Chronic combined systolic (congestive) and diastolic (congestive) heart failure I10 Essential  (primary) hypertension 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 Jeri Cos, PA and [facility] 9 8/9 at 10:45 AM Return A Other: - Arteriography 10/22/21 w/ Dr. Scot Dock Bathing/ Shower/ Hygiene May shower with protection but do not get wound dressing(s) wet. Edema Control - Lymphedema / SCD / Other Elevate legs to the level of the heart or above for 30 minutes daily and/or when sitting, a frequency of: Avoid standing for long periods of time. Wound Treatment Wound #5 - Lower Leg Wound Laterality: Right, Circumferential Cleanser: Soap and Water 2 x Per Week/7 Days Discharge Instructions: May shower and wash wound with dial antibacterial soap and water prior to dressing change. Peri-Wound Care: Triamcinolone 15 (g) 2 x Per Week/7 Days Discharge Instructions: Use triamcinolone 15 (g) as directed Peri-Wound Care: Sween Lotion (Moisturizing lotion) 2 x Per Week/7 Days Discharge Instructions: Apply moisturizing lotion as directed Prim Dressing: KerraCel Ag Gelling Fiber Dressing, 4x5 in (silver alginate) 2 x Per Week/7 Days ary Discharge Instructions: Apply silver alginate to wound bed as instructed Secondary Dressing: ABD Pad, 5x9 2 x Per Week/7 Days Discharge Instructions: Apply over primary dressing as directed. Compression Wrap: ThreePress (3 layer compression wrap) 2 x Per Week/7 Days Discharge Instructions: Apply three layer compression as directed. Wound #6 - Lower Leg Wound Laterality: Left, Circumferential Cleanser: Soap and Water 2 x Per Week/7 Days Discharge Instructions: May shower and wash wound with dial antibacterial soap and water prior to dressing change. Peri-Wound Care: Triamcinolone 15 (g) 2 x Per Week/7 Days Discharge Instructions: Use triamcinolone 15 (g) as directed Peri-Wound Care:  Sween Lotion (Moisturizing lotion) 2 x Per Week/7 Days Discharge Instructions: Apply moisturizing lotion as directed Prim Dressing: KerraCel Ag Gelling Fiber Dressing, 4x5 in (silver alginate) 2 x Per Week/7 Days ary Discharge Instructions: Apply silver alginate to wound bed as instructed Secondary Dressing: ABD Pad, 5x9 2 x Per Week/7 Days Discharge Instructions: Apply over primary dressing as directed. Compression Wrap: ThreePress (3 layer compression wrap) 2 x Per Week/7 Days Discharge Instructions: Apply three layer compression as directed. Electronic Signature(s) Signed: 11/03/2021 5:04:53 PM By: [name] PA-C Signed: 11/03/2021 5:11:01 PM By: Sabas Sous By: Adline Peals on 11/03/2021 12:06:52 -------------------------------------------------------------------------------- Problem List Details Patient Name: Date of Service: [name], [name] 11/03/2021 11:30 A M Medical Record Number: [id number] Patient Account Number: [id number] Date of Birth/Sex: Treating RN: 04/16/1960 (61 y.o. M) Primary Care Provider: [name] Other Clinician: Referring Provider: Treating Provider/Extender: [name] in Treatment: 10 Active Problems ICD-10 Encounter Code Description Active Date MDM Diagnosis I89.0 Lymphedema, not elsewhere classified 08/25/2021 No Yes I87.333 Chronic venous hypertension (idiopathic) with ulcer and inflammation of 08/25/2021 No Yes bilateral lower extremity L97.822 Non-pressure chronic ulcer of other part of left lower leg with fat  layer exposed5/24/2023 No Yes L97.812 Non-pressure chronic ulcer of other part of right lower leg with fat layer 08/25/2021 No Yes exposed N18.30 Chronic kidney disease, stage 3 unspecified 08/25/2021 No Yes I50.42 Chronic combined systolic (congestive) and diastolic (congestive) heart failure 08/25/2021 No Yes I10 Essential (primary) hypertension 08/25/2021 No Yes I70.201 Unspecified  atherosclerosis of native arteries of extremities, right leg 09/08/2021 No Yes I70.248 Atherosclerosis of native arteries of left leg with ulceration of other part of 09/08/2021 No Yes lower leg Inactive Problems Resolved Problems Electronic Signature(s) Signed: 11/03/2021 11:29:36 AM By: [name] PA-C Entered By: [name] on 11/03/2021 11:29:35 -------------------------------------------------------------------------------- Progress Note Details Patient Name: Date of Service: [name], [name] 11/03/2021 11:30 [place] Record Number: [id number] Patient Account Number: [id number] Date of Birth/Sex: Treating RN: 1960-10-16 (61 y.o. M) Primary Care Provider: [name] Other Clinician: Referring Provider: Treating Provider/Extender: [name] in Treatment: 10 Subjective Chief Complaint Information obtained from Patient Bilateral Leg Ulcers History of Present Illness (HPI) 02/24/2020 on evaluation today patient presents with obvious chronic lymphedema of the bilateral lower extremities. Fortunately there is no signs of active infection at this time which is great news. With that being said the patient does have a recent treatment with doxycycline 100 mg for 10 days which he has completed. He does have a history of chronic kidney disease stage III as documented in his chart. He also has chronic venous insufficiency with lower extremity lymphedema noted. He has congestive heart failure and hypertension. He tells me currently that he was unaware of the kidney disease before he mentioned this today. He also tells me that his primary care provider had told him to quit taking showers based on what was going on with his legs at this time. Fortunately there is no signs of active infection at this time which is great news. No fevers, chills, nausea, vomiting, or diarrhea. 03/04/2020 on evaluation today patient actually appears to be doing much better in regard to  his legs. There is really not much draining a lot of the dry skin is starting to loosen up amount of time to get as much of this off today as possible 03/11/2020 upon evaluation today patient appears to be doing well currently in regard to his legs. He still has a lot of dry skin some which I was able to get off today but overall I think that the biggest thing he needs right now is to be able to wash his legs in the shower to loosen some of this up which I think will then come off much more effectively and quickly without as much of an issue. Fortunately there is no evidence of active infection at this time. No fevers, chills, nausea, vomiting, or diarrhea. Readmission: 06/05/2020 upon evaluation today patient appears to be doing worse in regard to his bilateral lower extremities. Unfortunately he has had some issues here with his leg started to swell again he tells me he was not able to wear the compression stockings that he got from elastic therapy. He states when he called the leg he said that the measurements did not seem right and subsequently just sent him something she thought would work. Either way he tells me they were very tight and very uncomfortable he just was not able to stick with it. Fortunately there is no signs of active infection at this time. No fevers, chills, nausea, vomiting, or diarrhea. 06/12/2020 on evaluation today patient actually appears to be doing  well in regard to his legs. He has a lot of dry skin but nothing that appears to be open at this point. He did not get the Velcro compression wraps but does have the pull up compression stockings which if he wears those can definitely be sufficient here. Readmission: 08-25-2021 upon evaluation today patient appears to be doing well with regard to his legs all things considered. With that being said I do not see anything that actively looks like it is open or draining but I definitely see spots where he had blistering and obvious  signs of drainage. Subsequently he is supposed to be having open heart surgery to replace a valve that sounds like but they are not able to do that simply due to the fact that right now he is having more issues currently with his legs and they will not do anything until that is resolved. Fortunately I do not see any evidence of active infection locally or systemically which is great news. No fevers, chills, nausea, vomiting, or diarrhea. The patient does have a history noted previously of lymphedema, chronic venous hypertension, chronic kidney disease stage III, congestive heart failure, and hypertension. 09-01-2021 upon evaluation today patient actually appears to be healed I do not see anything open at this time which is great news. Fortunately he is doing excellent and I think they were on the right track. With that being said there were a couple areas where he was so dry that the wrap actually stuck to his leg and this has caused some issues here with a couple small openings remaining but I think by next week these will be sealed up. Overall I think that he needs some lotion twice a day and Ace wraps are probably to be the best way to go to help with compression for now. He does have the vascular appointment upcoming in a couple of days. 6/7; patient comes in today with quite a deterioration from last week. Coupled with this is that he had his arterial studies which were really not very good on the right his ABI was 0.92 and on the left 1.09 but TBI was 0.52 on the right and 0.36 on the left he had monophasic waveforms almost diffusely on the right with an estimated 50 to 74% stenosis of the distal SFA on the left he had similar findings. 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 We are working on getting him a vascular  evaluation./Consultation Also looking through his chart he has a lot of other medical problems including severe aortic stenosis and he has been referred to cardiac surgery. He has atrial fibrillation on Eliquis 09-15-2021 upon evaluation today patient appears to be doing better than it sounds like he was last week. Fortunately I do not see any evidence of active infection locally or systemically which is great news. No fevers, chills, nausea, vomiting, or diarrhea. He does have an appointment with vascular tomorrow. 09-22-2021 upon evaluation today patient appears to be doing well with regard to his legs all things considered. I did review his note and it appears that he has significant peripheral vascular disease bilaterally in the lower extremities. Subsequently this seems to be even a femoral and iliac arteries. Subsequently I do believe that the arteriogram is probably going to be a good way to go as far as trying to improve his blood  flow as noted in the record from Dr. Waverly Ferrari on 09-16-2021. 09-29-2021 upon evaluation today patient appears to be doing decently well today in regard to his wounds on the legs. Things are actually showing signs of good improvement and I am very pleased in that regard. I do not see any signs of infection at this time which is great news and in fact everything is pretty dry today. He does have his arteriogram with Dr. Durwin Nora on Friday. With that being said once that is complete I really think things will be significantly improved and hopefully he should be able to not only feel better but also continue with his own compression socks. 10-06-2021 upon evaluation today patient appears to be doing well with regard to his legs in fact there is very little open at this point. Fortunately I do not see any signs of active infection which is great news. No fevers, chills, nausea, vomiting, or diarrhea. 10-13-2021 upon evaluation today patient's legs actually appear to be  doing decently well. I am really seeing not much in the way of drainage at this point. Fortunately I think that he is on the right track and I believe that we are very close to complete resolution and discharge and at this point I do think that he is tolerating the compression wraps without complication. He has his appointment with vascular coming up on the 21st of this month which is just a week and a half away. 10-20-2021 upon evaluation today patient appears to be doing well currently in regard to his legs. There is very little open or draining at this point he actually has his procedure with vascular on Friday. Hopefully he will have much better blood flow following this procedure. 7/26; patient tells me that he went for revascularization on the left leg with an apparent stent but I did not look over this and care everywhere today. He arrives in clinic today with small wounds on the right lateral right medial leg more impressive areas on the left medial and left posterior. We have been using silver alginate and kerlix Coban. 11-03-2021 patient appears to be doing much better in regard to his legs at this point. He has been revascularized and seems to be doing excellent as far as that is concerned. Fortunately I see no evidence of active infection at this time which is great news. No fevers, chills, nausea, vomiting, or diarrhea. Objective Constitutional Well-nourished and well-hydrated in no acute distress. Vitals Time Taken: 11:45 AM, Height: 69 in, Weight: 215 lbs, BMI: 31.7, Temperature: 97.9 F, Pulse: 70 bpm, Respiratory Rate: 18 breaths/min, Blood Pressure: 179/100 mmHg. Respiratory normal breathing without difficulty. Psychiatric this patient is able to make decisions and demonstrates good insight into disease process. Alert and Oriented x 3. pleasant and cooperative. General Notes: Upon inspection patient's legs actually are showing signs of significant improvement I do believe the  triamcinolone is really helping to loosen up a lot of the dry dead skin which is improving the overall appearance of his legs and with improved blood flow he is overall doing much better. Integumentary (Hair, Skin) Wound #5 status is Open. Original cause of wound was Gradually Appeared. The date acquired was: 07/03/2021. The wound has been in treatment 10 weeks. The wound is located on the Right,Circumferential Lower Leg. The wound measures 3.7cm length x 3.5cm width x 0.1cm depth; 10.171cm^2 area and 1.017cm^3 volume. There is Fat Layer (Subcutaneous Tissue) exposed. There is no tunneling or undermining noted. There is a  large amount of serosanguineous drainage noted. The wound margin is distinct with the outline attached to the wound base. There is large (67-100%) red, pink granulation within the wound bed. There is a small (1-33%) amount of necrotic tissue within the wound bed including Adherent Slough. Wound #6 status is Open. Original cause of wound was Gradually Appeared. The date acquired was: 07/03/2021. The wound has been in treatment 10 weeks. The wound is located on the Left,Circumferential Lower Leg. The wound measures 11.5cm length x 17cm width x 0.1cm depth; 153.545cm^2 area and 15.355cm^3 volume. There is Fat Layer (Subcutaneous Tissue) exposed. There is no tunneling or undermining noted. There is a medium amount of serosanguineous drainage noted. The wound margin is distinct with the outline attached to the wound base. There is medium (34-66%) red, pink granulation within the wound bed. There is a medium (34-66%) amount of necrotic tissue within the wound bed including Eschar and Adherent Slough. 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 Jeri Cos, PA and [facility] # [id number] at 10:45 AM Other: - Arteriography 10/22/21 w/ Dr. Chalmers Guest Shower/ Hygiene: May shower with protection but do not get wound dressing(s) wet. Edema Control - Lymphedema / SCD / Other: Elevate legs to the level of the heart or above for 30 minutes daily and/or when sitting, a frequency of: Avoid standing for long periods of time. WOUND #5: - Lower Leg Wound Laterality: Right, Circumferential Cleanser: Soap and Water 2 x Per Week/7 Days Discharge Instructions: May shower and wash wound with dial antibacterial soap and water prior to dressing change. Peri-Wound Care: Triamcinolone 15 (g) 2 x Per Week/7 Days Discharge Instructions: Use triamcinolone 15 (g) as directed Peri-Wound Care: Sween Lotion (Moisturizing lotion) 2 x Per Week/7 Days Discharge Instructions: Apply moisturizing lotion as directed Prim Dressing: KerraCel Ag Gelling Fiber Dressing, 4x5 in (silver alginate) 2 x Per Week/7 Days ary Discharge Instructions: Apply silver alginate to wound bed as instructed Secondary Dressing: ABD Pad, 5x9 2 x Per Week/7 Days Discharge Instructions: Apply over primary dressing as directed. Com pression Wrap: ThreePress (3 layer compression wrap) 2 x Per Week/7 Days Discharge Instructions: Apply three layer compression as directed. WOUND #6: - Lower Leg Wound Laterality: Left, Circumferential Cleanser: Soap and Water 2 x Per Week/7 Days Discharge Instructions: May shower and wash wound with dial antibacterial soap and water prior to  dressing change. Peri-Wound Care: Triamcinolone 15 (g) 2 x Per Week/7 Days Discharge Instructions: Use triamcinolone 15 (g) as directed Peri-Wound Care: Sween Lotion (Moisturizing lotion) 2 x Per Week/7 Days Discharge Instructions: Apply moisturizing lotion as directed Prim Dressing: KerraCel Ag Gelling Fiber Dressing, 4x5 in (silver alginate) 2 x Per Week/7 Days ary Discharge Instructions: Apply silver alginate to wound bed as instructed Secondary Dressing: ABD Pad, 5x9 2 x Per Week/7 Days Discharge Instructions: Apply over primary dressing as directed. Com pression Wrap: ThreePress (3 layer compression wrap) 2 x Per Week/7 Days Discharge Instructions: Apply three layer  compression as directed. 1. I am good recommend that we going continue with the wound care measures as before and the patient is in agreement with plan. This includes the use of the silver alginate dressing to the open areas which I think is doing well. 2. We will continue with the triamcinolone to the legs in general. 3. I am also going to suggest a continuation of 3 layer compression wrapping which I do believe is doing quite well. We will see patient back for reevaluation in 1 week here in the clinic. If anything worsens or changes patient will contact our office for additional recommendations. Electronic Signature(s) Signed: 11/03/2021 1:07:18 PM By: [name] PA-C Entered By: [name] on 11/03/2021 13:07:18 -------------------------------------------------------------------------------- SuperBill Details Patient Name: Date of Service: [name], [name] 11/03/2021 Medical Record Number: [id number] Patient Account Number: [id number] Date of Birth/Sex: Treating RN: 22-Mar-1961 (61 y.o. M) Primary Care Provider: [name] Other Clinician: Referring Provider: Treating Provider/Extender: [name] in Treatment: 10 Diagnosis Coding ICD-10 Codes Code Description I89.0 Lymphedema,  not elsewhere classified I87.333 Chronic venous hypertension (idiopathic) with ulcer and inflammation of bilateral lower extremity L97.822 Non-pressure chronic ulcer of other part of left lower leg with fat layer exposed L97.812 Non-pressure chronic ulcer of other part of right lower leg with fat layer exposed N18.30 Chronic kidney disease, stage 3 unspecified I50.42 Chronic combined systolic (congestive) and diastolic (congestive) heart failure I10 Essential (primary) hypertension 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, 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 Facility Procedures CPT4 Code: [id number] Description: 99213 - WOUND CARE VISIT-LEV 3 EST PT Modifier: Quantity: 1 Physician Procedures : CPT4 Code Description Modifier [id number] - WC PHYS LEVEL 3 - EST PT ICD-10 Diagnosis Description I89.0 Lymphedema, not elsewhere classified I87.333 Chronic venous hypertension (idiopathic) 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

## 2021-11-03 NOTE — Progress Notes (Signed)
DIAZ, CRAGO ([id number]) Visit Report for 11/03/2021 Arrival Information Details Patient Name: Date of Service: [name], [name] 11/03/2021 11:30 A M Medical Record Number: [id number] Patient Account Number: [id number] Date of Birth/Sex: Treating RN: 01-09-1961 (61 y.o. Lytle Michaels Primary Care Masey Scheiber: [name] Other Clinician: Referring Aunesti Pellegrino: Treating Elwanda Moger/Extender: [name] in Treatment: 10 Visit Information History Since Last Visit Added or deleted any medications: No Patient Arrived: Ambulatory Any new allergies or adverse reactions: No Arrival Time: 11:41 Had a fall or experienced change in No Transfer Assistance: None activities of daily living that may affect Patient Identification Verified: Yes risk of falls: Secondary Verification Process Completed: Yes Signs or symptoms of abuse/neglect since last visito No Patient Requires Transmission-Based Precautions: No Hospitalized since last visit: No Patient Has Alerts: No Implantable device outside of the clinic excluding No cellular tissue based products placed in the center since last visit: Has Dressing in Place as Prescribed: Yes Has Compression in Place as Prescribed: Yes Pain Present Now: No Electronic Signature(s) Signed: 11/03/2021 5:05:50 PM By: Antonieta Iba Entered By: Antonieta Iba on 11/03/2021 11:44:11 -------------------------------------------------------------------------------- Clinic Level of Care Assessment Details Patient Name: Date of Service: [name], [name] 11/03/2021 11:30 A M Medical Record Number: [id number] Patient Account Number: [id number] Date of Birth/Sex: Treating RN: 12-Jun-1960 (61 y.o. Marlan Palau Primary Care Braxton Weisbecker: [name] Other Clinician: Referring Keyah Blizard: Treating Dak Szumski/Extender: [name] in Treatment: 10 Clinic Level of Care Assessment Items TOOL 4 Quantity Score X- 1 0 Use when only an  EandM is performed on FOLLOW-UP visit ASSESSMENTS - Nursing Assessment / Reassessment X- 1 10 Reassessment of Co-morbidities (includes updates in patient status) X- 1 5 Reassessment of Adherence to Treatment Plan ASSESSMENTS - Wound and Skin A ssessment / Reassessment X - Simple Wound Assessment / Reassessment - one wound 1 5 []  - 0 Complex Wound Assessment / Reassessment - multiple wounds []  - 0 Dermatologic / Skin Assessment (not related to wound area) ASSESSMENTS - Focused Assessment X- 1 5 Circumferential Edema Measurements - multi extremities []  - 0 Nutritional Assessment / Counseling / Intervention X- 1 5 Lower Extremity Assessment (monofilament, tuning fork, pulses) []  - 0 Peripheral Arterial Disease Assessment (using hand held doppler) ASSESSMENTS - Ostomy and/or Continence Assessment and Care []  - 0 Incontinence Assessment and Management []  - 0 Ostomy Care Assessment and Management (repouching, etc.) PROCESS - Coordination of Care X - Simple Patient / Family Education for ongoing care 1 15 []  - 0 Complex (extensive) Patient / Family Education for ongoing care X- 1 10 Staff obtains Consents, Records, T Results / Process Orders est []  - 0 Staff telephones HHA, Nursing Homes / Clarify orders / etc []  - 0 Routine Transfer to another Facility (non-emergent condition) []  - 0 Routine Hospital Admission (non-emergent condition) []  - 0 New Admissions / / Ordering NPWT Apligraf, etc. , []  - 0 Emergency Hospital Admission (emergent condition) X- 1 10 Simple Discharge Coordination []  - 0 Complex (extensive) Discharge Coordination PROCESS - Special Needs []  - 0 Pediatric / Minor Patient Management []  - 0 Isolation Patient Management []  - 0 Hearing / Language / Visual special needs []  - 0 Assessment of Community assistance (transportation, D/C planning, etc.) []  - 0 Additional assistance / Altered mentation []  - 0 Support Surface(s)  Assessment (bed, cushion, seat, etc.) INTERVENTIONS - Wound Cleansing / Measurement []  - 0 Simple Wound Cleansing - one wound X- 1 5 Complex Wound Cleansing -  multiple wounds []  - 0 Wound Imaging (photographs - any number of wounds) []  - 0 Wound Tracing (instead of photographs) []  - 0 Simple Wound Measurement - one wound X- 1 5 Complex Wound Measurement - multiple wounds INTERVENTIONS - Wound Dressings []  - 0 Small Wound Dressing one or multiple wounds []  - 0 Medium Wound Dressing one or multiple wounds X- 1 20 Large Wound Dressing one or multiple wounds []  - 0 Application of Medications - topical []  - 0 Application of Medications - injection INTERVENTIONS - Miscellaneous []  - 0 External ear exam []  - 0 Specimen Collection (cultures, biopsies, blood, body fluids, etc.) []  - 0 Specimen(s) / Culture(s) sent or taken to Lab for analysis []  - 0 Patient Transfer (multiple staff / Lift / Similar devices) []  - 0 Simple Staple / Suture removal (25 or less) []  - 0 Complex Staple / Suture removal (26 or more) []  - 0 Hypo / Hyperglycemic Management (close monitor of Blood Glucose) []  - 0 Ankle / Brachial Index (ABI) - do not check if billed separately X- 1 5 Vital Signs Has the patient been seen at the hospital within the last three years: Yes Total Score: 100 Level Of Care: New/Established - Level 3 Electronic Signature(s) Signed: 11/03/2021 5:11:01 PM By: Entered By: on 11/03/2021 13:10:21 -------------------------------------------------------------------------------- Encounter Discharge Information Details Patient Name: Date of Service: [name], [name] 11/03/2021 11:30 A M Medical Record Number: Patient Account Number: Date of Birth/Sex: Treating RN: Apr 29, 1960 (61 y.o. [name] Primary Care Zyriah Mask: Other Clinician: Referring Javone Ybanez: Treating Isidora Laham/Extender: in Treatment: 10 Encounter Discharge Information Items Discharge Condition: Stable Ambulatory Status: Ambulatory Discharge Destination: Home Transportation: Private Auto Accompanied By: self Schedule Follow-up Appointment: Yes Clinical Summary of Care: Patient Declined Electronic Signature(s) Signed: 11/03/2021 5:11:01 PM By: By: 01/03/2022 on 11/03/2021 13:10:52 -------------------------------------------------------------------------------- Lower Extremity Assessment Details Patient Name: Date of Service: [name], [name] 11/03/2021 11:30 A M Medical Record Number: Bobbe Medico Patient Account Number: 01/03/2022 Date of Birth/Sex: Treating RN: 1961/03/31 (61 y.o. 01/04/1961 Primary Care Marian Grandt: 76 Other Clinician: Referring Jsoeph Podesta: Treating Danija Gosa/Extender: Marlan Palau in Treatment: 10 Edema Assessment Assessed: [name]: Yes] [name]: Yes] Edema: [Left: Yes] [Right: Yes] Calf Left: Right: Point of Measurement: From Medial Instep 33.8 cm 33.5 cm Ankle Left: Right: Point of Measurement: From Medial Instep 24.6 cm 24.3 cm Vascular Assessment Pulses: Dorsalis Pedis Palpable: [Left:Yes] [Right:Yes] Electronic Signature(s) Signed: 11/03/2021 5:05:50 PM By: Gelene Mink Entered By: Samuella Bruin on 11/03/2021 11:52:35 -------------------------------------------------------------------------------- Multi-Disciplinary Care Plan Details Patient Name: Date of Service: [name], [name] 11/03/2021 11:30 A M Medical Record Number: [id number] Patient Account Number: [id number] Date of Birth/Sex: Treating RN: 1960-11-06 (61 y.o. Lytle Michaels Primary Care Shrika Milos: [name] Other Clinician: Referring Meko Bellanger: Treating Xhaiden Coombs/Extender: [name] in Treatment: 10 Active Inactive Wound/Skin Impairment Nursing Diagnoses: Impaired tissue  integrity Knowledge deficit related to ulceration/compromised skin integrity Goals: Patient/caregiver will verbalize understanding of skin care regimen Date Initiated: 08/25/2021 Target Resolution Date: 11/25/2021 Goal Status: Active Ulcer/skin breakdown will have a volume reduction of 30% by week 4 Date Initiated: 08/25/2021 Target Resolution Date: 11/25/2021 Goal Status: Active Interventions: Assess patient/caregiver ability to obtain necessary supplies Assess patient/caregiver ability to perform ulcer/skin care regimen upon admission and as needed Assess ulceration(s) every visit Notes: Electronic Signature(s) Signed: 11/03/2021 5:05:50 PM By: 01/03/2022 Entered By:  Antonieta Iba on 11/03/2021 12:00:10 -------------------------------------------------------------------------------- Pain Assessment Details Patient Name: Date of Service: [name], [name] 11/03/2021 11:30 A M Medical Record Number: [id number] Patient Account Number: [id number] Date of Birth/Sex: Treating RN: 1960/09/26 (61 y.o. Lytle Michaels Primary Care Priyanka Causey: [name] Other Clinician: Referring Zeyad Delaguila: Treating Donika Butner/Extender: [name] in Treatment: 10 Active Problems Location of Pain Severity and Description of Pain Patient Has Paino No Site Locations Pain Management and Medication Current Pain Management: Electronic Signature(s) Signed: 11/03/2021 5:05:50 PM By: Antonieta Iba Entered By: Antonieta Iba on 11/03/2021 11:46:06 -------------------------------------------------------------------------------- Patient/Caregiver Education Details Patient Name: Date of Service: Bobbe Medico 8/2/2023andnbsp11:30 A M Medical Record Number: [id number] Patient Account Number: [id number] Date of Birth/Gender: Treating RN: Jul 08, 1960 (61 y.o. Lytle Michaels Primary Care Physician: [name] Other Clinician: Referring Physician: Treating Physician/Extender:  [name] in Treatment: 10 Education Assessment Education Provided To: Patient Education Topics Provided Wound/Skin Impairment: Methods: Explain/Verbal Responses: Reinforcements needed, State content correctly Electronic Signature(s) Signed: 11/03/2021 5:05:50 PM By: Antonieta Iba Entered By: Antonieta Iba on 11/03/2021 12:00:20 -------------------------------------------------------------------------------- Wound Assessment Details Patient Name: Date of Service: [name], [name] 11/03/2021 11:30 A M Medical Record Number: [id number] Patient Account Number: [id number] Date of Birth/Sex: Treating RN: 03/29/1961 (61 y.o. Lytle Michaels Primary Care Yuridia Couts: [name] Other Clinician: Referring Xsavier Seeley: Treating Octa Uplinger/Extender: [name] in Treatment: 10 Wound Status Wound Number: 5 Primary Etiology: Venous Leg Ulcer Wound Location: Right, Circumferential Lower Leg Wound Status: Open Wounding Event: Gradually Appeared Comorbid History: Congestive Heart Failure, Hypertension Date Acquired: 07/03/2021 Weeks Of Treatment: 10 Clustered Wound: No Photos Wound Measurements Length: (cm) 3.7 Width: (cm) 3.5 Depth: (cm) 0.1 Area: (cm) 10.171 Volume: (cm) 1.017 % Reduction in Area: 98.9% % Reduction in Volume: 98.9% Epithelialization: Large (67-100%) Tunneling: No Undermining: No Wound Description Classification: Full Thickness With Exposed Support Structures Wound Margin: Distinct, outline attached Exudate Amount: Large Exudate Type: Serosanguineous Exudate Color: red, brown Foul Odor After Cleansing: No Slough/Fibrino Yes Wound Bed Granulation Amount: Large (67-100%) Exposed Structure Granulation Quality: Red, Pink Fascia Exposed: No Necrotic Amount: Small (1-33%) Fat Layer (Subcutaneous Tissue) Exposed: Yes Necrotic Quality: Adherent Slough Tendon Exposed: No Muscle Exposed: No Joint Exposed:  No Bone Exposed: No Treatment Notes Wound #5 (Lower Leg) Wound Laterality: Right, Circumferential Cleanser Soap and Water Discharge Instruction: May shower and wash wound with dial antibacterial soap and water prior to dressing change. Peri-Wound Care Triamcinolone 15 (g) Discharge Instruction: Use triamcinolone 15 (g) as directed Sween Lotion (Moisturizing lotion) Discharge Instruction: Apply moisturizing lotion as directed Topical Primary Dressing KerraCel Ag Gelling Fiber Dressing, 4x5 in (silver alginate) Discharge Instruction: Apply silver alginate to wound bed as instructed Secondary Dressing ABD Pad, 5x9 Discharge Instruction: Apply over primary dressing as directed. Secured With Compression Wrap ThreePress (3 layer compression wrap) Discharge Instruction: Apply three layer compression as directed. Compression Stockings Add-Ons Electronic Signature(s) Signed: 11/03/2021 5:05:50 PM By: Antonieta Iba Entered By: Antonieta Iba on 11/03/2021 11:58:01 -------------------------------------------------------------------------------- Wound Assessment Details Patient Name: Date of Service: [name], [name] 11/03/2021 11:30 A M Medical Record Number: [id number] Patient Account Number: [id number] Date of Birth/Sex: Treating RN: November 22, 1960 (61 y.o. Lytle Michaels Primary Care Yaelis Scharfenberg: [name] Other Clinician: Referring Jemal Miskell: Treating Ercelle Winkles/Extender: [name] in Treatment: 10 Wound Status Wound Number: 6 Primary Etiology: Venous Leg Ulcer Wound Location: Left, Circumferential Lower Leg Wound Status: Open Wounding Event: Gradually Appeared Comorbid History: Congestive Heart Failure,  Hypertension Date Acquired: 07/03/2021 Weeks Of Treatment: 10 Clustered Wound: No Photos Wound Measurements Length: (cm) 11.5 Width: (cm) 17 Depth: (cm) 0.1 Area: (cm) 153.545 Volume: (cm) 15.355 % Reduction in Area: 80.3% % Reduction in Volume:  80.3% Epithelialization: Medium (34-66%) Tunneling: No Undermining: No Wound Description Classification: Full Thickness With Exposed Support Structures Wound Margin: Distinct, outline attached Exudate Amount: Medium Exudate Type: Serosanguineous Exudate Color: red, brown Foul Odor After Cleansing: No Slough/Fibrino Yes Wound Bed Granulation Amount: Medium (34-66%) Exposed Structure Granulation Quality: Red, Pink Fascia Exposed: No Necrotic Amount: Medium (34-66%) Fat Layer (Subcutaneous Tissue) Exposed: Yes Necrotic Quality: Eschar, Adherent Slough Tendon Exposed: No Muscle Exposed: No Joint Exposed: No Bone Exposed: No Treatment Notes Wound #6 (Lower Leg) Wound Laterality: Left, Circumferential Cleanser Soap and Water Discharge Instruction: May shower and wash wound with dial antibacterial soap and water prior to dressing change. Peri-Wound Care Triamcinolone 15 (g) Discharge Instruction: Use triamcinolone 15 (g) as directed Sween Lotion (Moisturizing lotion) Discharge Instruction: Apply moisturizing lotion as directed Topical Primary Dressing KerraCel Ag Gelling Fiber Dressing, 4x5 in (silver alginate) Discharge Instruction: Apply silver alginate to wound bed as instructed Secondary Dressing ABD Pad, 5x9 Discharge Instruction: Apply over primary dressing as directed. Secured With Compression Wrap ThreePress (3 layer compression wrap) Discharge Instruction: Apply three layer compression as directed. Compression Stockings Add-Ons Electronic Signature(s) Signed: 11/03/2021 5:05:50 PM By: Antonieta Iba Entered By: Antonieta Iba on 11/03/2021 11:59:53 -------------------------------------------------------------------------------- Vitals Details Patient Name: Date of Service: [name], [name] 11/03/2021 11:30 A M Medical Record Number: [id number] Patient Account Number: [id number] Date of Birth/Sex: Treating RN: [birth year] (61 y.o. Lytle Michaels Primary Care  Ronne Savoia: [name] Other Clinician: Referring Corbitt Cloke: Treating Jimmy Plessinger/Extender: [name] in Treatment: 10 Vital Signs Time Taken: 11:45 Temperature (F): 97.9 Height (in): 69 Pulse (bpm): 70 Weight (lbs): 215 Respiratory Rate (breaths/min): 18 Body Mass Index (BMI): 31.7 Blood Pressure (mmHg): 179/100 Reference Range: 80 - 120 mg / dl Electronic Signature(s) Signed: 11/03/2021 5:05:50 PM By: Antonieta Iba Entered By: Antonieta Iba on 11/03/2021 11:45:38

## 2021-11-07 ENCOUNTER — Telehealth: Payer: Self-pay | Admitting: Critical Care Medicine

## 2021-11-07 NOTE — Telephone Encounter (Signed)
Pt needs to BE ADDED ON AT 130PM 11/08/21

## 2021-11-08 ENCOUNTER — Ambulatory Visit: Payer: Medicare Other | Attending: Critical Care Medicine | Admitting: Critical Care Medicine

## 2021-11-08 ENCOUNTER — Encounter: Payer: Self-pay | Admitting: Critical Care Medicine

## 2021-11-08 VITALS — BP 175/100 | HR 92 | Ht 69.0 in | Wt 208.8 lb

## 2021-11-08 DIAGNOSIS — S81801D Unspecified open wound, right lower leg, subsequent encounter: Secondary | ICD-10-CM | POA: Diagnosis not present

## 2021-11-08 DIAGNOSIS — I5032 Chronic diastolic (congestive) heart failure: Secondary | ICD-10-CM

## 2021-11-08 DIAGNOSIS — I10 Essential (primary) hypertension: Secondary | ICD-10-CM | POA: Diagnosis not present

## 2021-11-08 DIAGNOSIS — S81802D Unspecified open wound, left lower leg, subsequent encounter: Secondary | ICD-10-CM | POA: Diagnosis not present

## 2021-11-08 MED ORDER — SPIRONOLACTONE 50 MG PO TABS: 50.0000 mg | ORAL_TABLET | Freq: Every day | ORAL | 3 refills | Status: DC

## 2021-11-08 MED ORDER — CLONIDINE HCL 0.2 MG PO TABS: 0.2000 mg | ORAL_TABLET | Freq: Three times a day (TID) | ORAL | 3 refills | Status: DC

## 2021-11-08 MED ORDER — APIXABAN 5 MG PO TABS: 5.0000 mg | ORAL_TABLET | Freq: Two times a day (BID) | ORAL | 3 refills | Status: DC

## 2021-11-08 MED ORDER — GABAPENTIN 400 MG PO CAPS: ORAL_CAPSULE | ORAL | 2 refills | Status: DC

## 2021-11-08 MED ORDER — HYDRALAZINE HCL 100 MG PO TABS
100.0000 mg | ORAL_TABLET | Freq: Three times a day (TID) | ORAL | 1 refills | Status: DC
Start: 2021-11-08 — End: 2021-12-07

## 2021-11-08 MED ORDER — POTASSIUM CHLORIDE ER 20 MEQ PO TBCR: 10.0000 meq | EXTENDED_RELEASE_TABLET | ORAL | 3 refills | Status: DC

## 2021-11-08 NOTE — Assessment & Plan Note (Signed)
Stabilize diastolic heart failure

## 2021-11-08 NOTE — Progress Notes (Signed)
Acute Office Visit  Subjective:     Patient ID: [name], male    DOB: 1961/02/11, 61 y.o.   MRN: [id number]  Chief Complaint  Patient presents with   Hypertension    HPI This patient is seen today for evaluation of hypertension.  He was seen at the wound care center last week with blood pressures in the 180/130 range.  We had just seen him in June and make changes in his blood pressure medications.  He states he has been compliant with medications as prescribed.  I called him yesterday on the phone and told him to increase his hydralazine to 100 mg 3 times daily.  All other medications are the same.  Below is documentation from the last cardiology visit Aortic stenosis: Echocardiogram 03/2021 showed severe asymmetric LVH, EF 70 to 75%, severe aortic stenosis (Vmax 4 m/s, mean gradient 33 mmHg, AVA 0.9 to 1 cm).  LHC/RHC on 05/27/2021 showed 90% distal LAD stenosis, 90% D2 stenosis, otherwise nonobstructive CAD; RA 14, RV 42/12, PA 44/22/27, PCWP 19. -Previously has been asymptomatic but now reporting dyspnea with exertion.  Seen by [name], planning surgical AVR.  Was scheduled for April 2023, but cancelled as patient had developed worsening lower extremity edema and wounds on legs.  Will need wounds to heal before undergoing surgery.  Found to have bilateral PAD which is likely affecting wound healing.  Underwent angiography on 10/22/2021, and underwent angioplasty to left tibioperoneal trunk and left peroneal artery.   Chronic diastolic heart failure: Echocardiogram on 04/16/2020 showed LVEF 70 to 75%, moderate LVH, grade 2 diastolic dysfunction, moderate to severe aortic stenosis (Vmax 4.0 m/s, mean gradient 38 mmHg, AVA 1.1 cm, DI 0.32).  On Lasix 40 mg thrice daily -Continue lasix.   Will check BMET, magnesium   PAD: Lower extremity duplex 09/02/2021 showed right 50 to 74% stenosis in SFA, bilateral iliac occlusive disease, diminished flow in tibial vessels bilaterally.  Seen by Dr.  Scot Dock with [facility].  Underwent angiography on 10/22/2021, and underwent angioplasty to left tibioperoneal trunk and left peroneal artery.   LVH: Severe asymmetric LVH on echo, likely due to severe AS.  Cardiac MRI on 05/03/2021 showed severe LVH measuring up to 20 mm and basal septum (16 mm and posterior wall); no evidence of amyloidosis, and while meets criteria for HCM, likely is secondary to severe aortic stenosis.  There was patchy LGE in septum and RV insertion site accounting for 3% of total myocardial mass.    Atrial fibrillation: in A. fib at prior clinic visit.  Severe biatrial enlargement on echo, may be difficult to obtain rhythm control without antiarrhythmic.  CHA2DS2-VASc score 2 (hypertension, CHF) can just do -Continue Eliquis 5 mg twice daily.  He has also been taking ASA, can discontinue since on Eliquis -Continue Coreg 25 mg twice daily.   -Seen in A-fib clinic, recommended rate control strategy for now while undergoing valve work-up.  If undergoing surgical AVR, could undergo MAZE procedure   Resistant hypertension: on carvedilol 25 mg twice daily, clonidine 0.2 mg 3 times daily, Lasix 40 mg thrice daily, hydralazine 50 mg 3 times daily, olmesartan 40 mg daily.  Work-up for secondary causes includes no evidence of renal artery stenosis on duplex.  Elevated aldosterone/renin ratio but normal aldosterone level argues against hyperaldosteronism.  Normal TSH.  Sleep study shows severe OSA, suspect this is contributing.  Started on CPAP.  Was on amlodipine but discontinued due to edema -BP elevated.  Will check BMET, if  stable plan to add spironolactone.  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  Denies any abdominal pain or unintentional weight loss.  Continue to monitor.   Hyperlipidemia: On atorvastatin 20 mg daily.  LDL 46 on  08/23/2021   OSA: Severe OSA on sleep study, starting on CPAP.   QT prolongation: QTC 500 at prior clinic visit, most recent EKG with QTC 469, will continue to monitor     RTC in 6 weeks   The patient does have aortic stenosis but cannot have aortic valve replacement until his medical condition stabilizes the patient is had peripheral artery disease treated already   With the peripheral artery disease he has had stents placed in the left leg.  Wounds are improving from this.  Patient does note some shortness of breath with exertion.  There is no other complaints at this time.  Review of Systems  Constitutional:  Negative for chills, diaphoresis, fever, malaise/fatigue and weight loss.  HENT:  Negative for congestion, hearing loss, nosebleeds, sore throat and tinnitus.   Eyes:  Negative for blurred vision, photophobia and redness.  Respiratory:  Positive for shortness of breath. Negative for cough, hemoptysis, sputum production, wheezing and stridor.   Cardiovascular:  Negative for chest pain, palpitations, orthopnea, claudication, leg swelling and PND.  Gastrointestinal:  Negative for abdominal pain, blood in stool, constipation, diarrhea, heartburn, nausea and vomiting.  Genitourinary:  Negative for dysuria, flank pain, frequency, hematuria and urgency.  Musculoskeletal:  Negative for back pain, falls, joint pain, myalgias and neck pain.  Skin:  Negative for itching and rash.  Neurological:  Negative for dizziness, tingling, tremors, sensory change, speech change, focal weakness, seizures, loss of consciousness, weakness and headaches.  Endo/Heme/Allergies:  Negative for environmental allergies and polydipsia. Does not bruise/bleed easily.  Psychiatric/Behavioral:  Negative for depression, memory loss, substance abuse and suicidal ideas. The patient is not nervous/anxious and does not have insomnia.         Objective:    BP (!) 175/100   Pulse 92   Ht 5\' 9"  (1.753 m)   Wt 208 lb  12.8 oz (94.7 kg)   SpO2 96%   BMI 30.83 kg/m    Physical Exam Vitals reviewed.  Constitutional:      Appearance: Normal appearance. He is well-developed. He is not diaphoretic.  HENT:     Head: Normocephalic and atraumatic.     Nose: No nasal deformity, septal deviation, mucosal edema or rhinorrhea.     Right Sinus: No maxillary sinus tenderness or frontal sinus tenderness.     Left Sinus: No maxillary sinus tenderness or frontal sinus tenderness.     Mouth/Throat:     Pharynx: No oropharyngeal exudate.  Eyes:     General: No scleral icterus.    Conjunctiva/sclera: Conjunctivae normal.     Pupils: Pupils are equal, round, and reactive to light.  Neck:     Thyroid: No thyromegaly.     Vascular: No carotid bruit or JVD.     Trachea: Trachea normal. No tracheal tenderness or tracheal deviation.  Cardiovascular:     Rate and Rhythm: Normal rate and regular rhythm.     Chest Wall: PMI is not displaced.     Pulses: Normal pulses. No decreased pulses.     Heart sounds: Normal heart sounds, S1 normal and S2 normal. Heart sounds not  distant. No murmur heard.    No systolic murmur is present.     No diastolic murmur is present.     No friction rub. No gallop. No S3 or S4 sounds.  Pulmonary:     Effort: No tachypnea, accessory muscle usage or respiratory distress.     Breath sounds: No stridor. No decreased breath sounds, wheezing, rhonchi or rales.  Chest:     Chest wall: No tenderness.  Abdominal:     General: Bowel sounds are normal. There is no distension.     Palpations: Abdomen is soft. Abdomen is not rigid.     Tenderness: There is no abdominal tenderness. There is no guarding or rebound.  Musculoskeletal:        General: Normal range of motion.     Cervical back: Normal range of motion and neck supple. No edema, erythema or rigidity. No muscular tenderness. Normal range of motion.  Lymphadenopathy:     Head:     Right side of head: No submental or submandibular  adenopathy.     Left side of head: No submental or submandibular adenopathy.     Cervical: No cervical adenopathy.  Skin:    General: Skin is warm and dry.     Coloration: Skin is not pale.     Findings: No rash.     Nails: There is no clubbing.  Neurological:     Mental Status: He is alert and oriented to person, place, and time.     Sensory: No sensory deficit.  Psychiatric:        Speech: Speech normal.        Behavior: Behavior normal.     No results found for any visits on 11/08/21.      Assessment & Plan:   Problem List Items Addressed This Visit       Cardiovascular and Mediastinum   HTN (hypertension) (Chronic)    Patient in for work in visit for blood pressure elevations he still at 175/100 he now has a home blood pressure meter similar numbers at home.  Plan here will be to increase hydralazine to 100 mg 3 times daily and increase Aldactone to 50 mg daily  Patient will return in 2 weeks for repeat metabolic panel and blood pressure check      Relevant Medications   cloNIDine (CATAPRES) 0.2 MG tablet   apixaban (ELIQUIS) 5 MG TABS tablet   hydrALAZINE (APRESOLINE) 100 MG tablet   spironolactone (ALDACTONE) 50 MG tablet   Chronic diastolic heart failure (HCC)    Stabilize diastolic heart failure      Relevant Medications   cloNIDine (CATAPRES) 0.2 MG tablet   apixaban (ELIQUIS) 5 MG TABS tablet   hydrALAZINE (APRESOLINE) 100 MG tablet   spironolactone (ALDACTONE) 50 MG tablet     Other   Open wound of lower extremity bilateral    Wounds are improved       Meds ordered this encounter  Medications   gabapentin (NEURONTIN) 400 MG capsule    Sig: TAKE 2 CAPSULES(800 MG) BY MOUTH THREE TIMES DAILY    Dispense:  180 capsule    Refill:  2   cloNIDine (CATAPRES) 0.2 MG tablet    Sig: Take 1 tablet (0.2 mg total) by mouth 3 (three) times daily.    Dispense:  270 tablet    Refill:  3   apixaban (ELIQUIS) 5 MG TABS tablet    Sig: Take 1 tablet (5 mg  total) by mouth 2 (two)  times daily.    Dispense:  180 tablet    Refill:  3   hydrALAZINE (APRESOLINE) 100 MG tablet    Sig: Take 1 tablet (100 mg total) by mouth 3 (three) times daily.    Dispense:  270 tablet    Refill:  1    Next refill dose change  pt taking two 50mg  tabs three times a day now   spironolactone (ALDACTONE) 50 MG tablet    Sig: Take 1 tablet (50 mg total) by mouth daily.    Dispense:  90 tablet    Refill:  3    Dose change   Potassium Chloride ER 20 MEQ TBCR    Sig: Take 10 mEq by mouth 2 (two) times a week.    Dispense:  90 tablet    Refill:  3    Dose decrease  38 minutes spent on this encounter  Return in about 2 weeks (around 11/22/2021) for htn.  11/24/2021, MD

## 2021-11-08 NOTE — Progress Notes (Deleted)
   Established Patient Office Visit  Subjective   Patient ID: [name], male    DOB: [birth year]  Age: 61 y.o. MRN: [id number]  No chief complaint on file.   HPI  {History (Optional):23778}  ROS    Objective:     There were no vitals taken for this visit. {Vitals History (Optional):23777}  Physical Exam   No results found for any visits on 11/08/21.  {Labs (Optional):23779}  The ASCVD Risk score (Arnett DK, et al., 2019) failed to calculate for the following reasons:   The valid total cholesterol range is 130 to 320 mg/dL    Assessment & Plan:   Problem List Items Addressed This Visit   None   No follow-ups on file.    Roxana Hires, Student-PA

## 2021-11-08 NOTE — Telephone Encounter (Signed)
Patient is schedule for 1:30 today 

## 2021-11-08 NOTE — Assessment & Plan Note (Signed)
Wounds are improved

## 2021-11-08 NOTE — Assessment & Plan Note (Signed)
Patient in for work in visit for blood pressure elevations he still at 175/100 he now has a home blood pressure meter similar numbers at home.  Plan here will be to increase hydralazine to 100 mg 3 times daily and increase Aldactone to 50 mg daily  Patient will return in 2 weeks for repeat metabolic panel and blood pressure check

## 2021-11-08 NOTE — Patient Instructions (Signed)
Increase Aldactone to 50 mg daily, take 2 of the tablets you currently have daily till that bottle is empty the refill be a single tablet 1 daily of the 50 mg tabs  Increase hydralazine to 100 mg 3 times daily, take 2 of the 50 mg 3 times daily that you have currently the refill that she will get is a single 100 mg 3 times daily  Reduce potassium to 1/2 tablet twice a week on Mondays and Saturdays  Return to see Dr. Delford Field 2 weeks we will be getting repeat labs on you  Refills on Eliquis and clonidine sent to your pharmacy  You have future refills on the dose changes as noted above  No changes in any of your other medications  We are working on getting you the BiPAP machine enough messaged Dr. Mayford Knife about the next steps to get the machine for you  Return to see Dr. Delford Field as stated 2 weeks with labs

## 2021-11-09 ENCOUNTER — Encounter: Payer: Self-pay | Admitting: Pharmacist

## 2021-11-09 ENCOUNTER — Ambulatory Visit (INDEPENDENT_AMBULATORY_CARE_PROVIDER_SITE_OTHER): Payer: Medicare Other | Admitting: Pharmacist

## 2021-11-09 VITALS — BP 126/79 | HR 77 | Wt 209.2 lb

## 2021-11-09 DIAGNOSIS — I5032 Chronic diastolic (congestive) heart failure: Secondary | ICD-10-CM | POA: Diagnosis not present

## 2021-11-09 MED ORDER — DAPAGLIFLOZIN PROPANEDIOL 10 MG PO TABS: 10.0000 mg | ORAL_TABLET | Freq: Every morning | ORAL | 11 refills | Status: DC
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
## 2021-11-09 NOTE — Patient Instructions (Addendum)
It was nice seeing you again today  Your blood pressure today looks much improved.  We would like to keep it less than 130/80  Continue your:  Furosemide 40mg  three times a day Spironolactone 50mg  daily Hydralazine 100mg  three times a day Carvedilol 25mg  twice a day Olmesartan 40mg  daily  We will start a new medication called Farxiga 10mg  once a day in the morning  Change your magnesium to every other day.  Continue your potassium twice a week  Please update your lab work in 1 week on 8/15.    Please call with any questions  , PharmD, BCACP, CDCES, CPP [address] [place], , Phone: [phone], Fax: [phone]

## 2021-11-10 ENCOUNTER — Encounter (HOSPITAL_BASED_OUTPATIENT_CLINIC_OR_DEPARTMENT_OTHER): Payer: Medicare Other | Admitting: Physician Assistant

## 2021-11-10 ENCOUNTER — Encounter: Payer: Self-pay | Admitting: Podiatry

## 2021-11-10 DIAGNOSIS — I890 Lymphedema, not elsewhere classified: Secondary | ICD-10-CM | POA: Diagnosis not present

## 2021-11-10 DIAGNOSIS — L97812 Non-pressure chronic ulcer of other part of right lower leg with fat layer exposed: Secondary | ICD-10-CM | POA: Diagnosis not present

## 2021-11-10 DIAGNOSIS — I509 Heart failure, unspecified: Secondary | ICD-10-CM | POA: Diagnosis not present

## 2021-11-10 DIAGNOSIS — N183 Chronic kidney disease, stage 3 unspecified: Secondary | ICD-10-CM | POA: Diagnosis not present

## 2021-11-10 DIAGNOSIS — L97822 Non-pressure chronic ulcer of other part of left lower leg with fat layer exposed: Secondary | ICD-10-CM | POA: Diagnosis not present

## 2021-11-10 DIAGNOSIS — I130 Hypertensive heart and chronic kidney disease with heart failure and stage 1 through stage 4 chronic kidney disease, or unspecified chronic kidney disease: Secondary | ICD-10-CM | POA: Diagnosis not present

## 2021-11-10 DIAGNOSIS — I350 Nonrheumatic aortic (valve) stenosis: Secondary | ICD-10-CM | POA: Diagnosis not present

## 2021-11-10 DIAGNOSIS — Z7901 Long term (current) use of anticoagulants: Secondary | ICD-10-CM | POA: Diagnosis not present

## 2021-11-10 DIAGNOSIS — I70248 Atherosclerosis of native arteries of left leg with ulceration of other part of lower left leg: Secondary | ICD-10-CM | POA: Diagnosis not present

## 2021-11-10 DIAGNOSIS — I87333 Chronic venous hypertension (idiopathic) with ulcer and inflammation of bilateral lower extremity: Secondary | ICD-10-CM | POA: Diagnosis not present

## 2021-11-10 DIAGNOSIS — I872 Venous insufficiency (chronic) (peripheral): Secondary | ICD-10-CM | POA: Diagnosis not present

## 2021-11-10 DIAGNOSIS — I5042 Chronic combined systolic (congestive) and diastolic (congestive) heart failure: Secondary | ICD-10-CM | POA: Diagnosis not present

## 2021-11-10 NOTE — Progress Notes (Addendum)
GERRETT, ARAGONEZ ([id number]) Visit Report for 11/10/2021 Chief Complaint Document Details Patient Name: Date of Service: [name], [name] 11/10/2021 10:45 A M Medical Record Number: [id number] Patient Account Number: [id number] Date of Birth/Sex: Treating RN: February 01, 1961 (61 y.o. [name], [name] Primary Care Provider: [name] Other Clinician: Referring Provider: Treating Provider/Extender: [name] in Treatment: 11 Information Obtained from: Patient Chief Complaint Bilateral Leg Ulcers Electronic Signature(s) Signed: 11/10/2021 10:44:03 AM By: [name] PA-C Entered By: [name] on 11/10/2021 10:44:03 -------------------------------------------------------------------------------- HPI Details Patient Name: Date of Service: [name], [name] 11/10/2021 10:45 A M Medical Record Number: [id number] Patient Account Number: [id number] Date of Birth/Sex: Treating RN: [birth year] (61 y.o. [name], [name] Primary Care Provider: [name] Other Clinician: Referring Provider: Treating Provider/Extender: [name] in Treatment: 11 History of Present Illness HPI Description: 02/24/2020 on evaluation today patient presents with obvious chronic lymphedema of the bilateral lower extremities. Fortunately there is no signs of active infection at this time which is great news. With that being said the patient does have a recent treatment with doxycycline 100 mg for 10 days which he has completed. He does have a history of chronic kidney disease stage III as documented in his chart. He also has chronic venous insufficiency with lower extremity lymphedema noted. He has congestive heart failure and hypertension. He tells me currently that he was unaware of the kidney disease before he mentioned this today. He also tells me that his primary care provider had told him to quit taking showers based on what was going on with his legs at this  time. Fortunately there is no signs of active infection at this time which is great news. No fevers, chills, nausea, vomiting, or diarrhea. 03/04/2020 on evaluation today patient actually appears to be doing much better in regard to his legs. There is really not much draining a lot of the dry skin is starting to loosen up amount of time to get as much of this off today as possible 03/11/2020 upon evaluation today patient appears to be doing well currently in regard to his legs. He still has a lot of dry skin some which I was able to get off today but overall I think that the biggest thing he needs right now is to be able to wash his legs in the shower to loosen some of this up which I think will then come off much more effectively and quickly without as much of an issue. Fortunately there is no evidence of active infection at this time. No fevers, chills, nausea, vomiting, or diarrhea. Readmission: 06/05/2020 upon evaluation today patient appears to be doing worse in regard to his bilateral lower extremities. Unfortunately he has had some issues here with his leg started to swell again he tells me he was not able to wear the compression stockings that he got from elastic therapy. He states when he called the leg he said that the measurements did not seem right and subsequently just sent him something she thought would work. Either way he tells me they were very tight and very uncomfortable he just was not able to stick with it. Fortunately there is no signs of active infection at this time. No fevers, chills, nausea, vomiting, or diarrhea. 06/12/2020 on evaluation today patient actually appears to be doing well in regard to his legs. He has a lot of dry skin but nothing that appears to be open at this point. He did not  get the Velcro compression wraps but does have the pull up compression stockings which if he wears those can definitely be sufficient here. Readmission: 08-25-2021 upon evaluation today  patient appears to be doing well with regard to his legs all things considered. With that being said I do not see anything that actively looks like it is open or draining but I definitely see spots where he had blistering and obvious signs of drainage. Subsequently he is supposed to be having open heart surgery to replace a valve that sounds like but they are not able to do that simply due to the fact that right now he is having more issues currently with his legs and they will not do anything until that is resolved. Fortunately I do not see any evidence of active infection locally or systemically which is great news. No fevers, chills, nausea, vomiting, or diarrhea. The patient does have a history noted previously of lymphedema, chronic venous hypertension, chronic kidney disease stage III, congestive heart failure, and hypertension. 09-01-2021 upon evaluation today patient actually appears to be healed I do not see anything open at this time which is great news. Fortunately he is doing excellent and I think they were on the right track. With that being said there were a couple areas where he was so dry that the wrap actually stuck to his leg and this has caused some issues here with a couple small openings remaining but I think by next week these will be sealed up. Overall I think that he needs some lotion twice a day and Ace wraps are probably to be the best way to go to help with compression for now. He does have the vascular appointment upcoming in a couple of days. 6/7; patient comes in today with quite a deterioration from last week. Coupled with this is that he had his arterial studies which were really not very good on the right his ABI was 0.92 and on the left 1.09 but TBI was 0.52 on the right and 0.36 on the left he had monophasic waveforms almost diffusely on the right with an estimated 50 to 74% stenosis of the distal SFA on the left he had similar findings. 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 We are working on getting him a vascular evaluation./Consultation Also looking through his chart he has a lot of other medical problems including severe aortic stenosis and he has been referred to cardiac surgery. He has atrial fibrillation on Eliquis 09-15-2021 upon evaluation today patient appears to be doing better than it sounds like he was last week. Fortunately I do not see any evidence of active infection locally or systemically which is great news. No fevers, chills, nausea, vomiting, or diarrhea. He does have an appointment with vascular tomorrow. 09-22-2021 upon evaluation today patient appears to be doing well with regard to his legs all things considered. I did review his note and it appears that he has significant peripheral vascular disease bilaterally in the lower extremities. Subsequently this seems to be even a femoral and iliac arteries. Subsequently I do believe that the arteriogram is probably going to be a good way to go as far as trying to improve his blood flow as noted in the record from Dr. Deitra Mayo on 09-16-2021. 09-29-2021 upon evaluation today patient appears to be doing decently well today in regard  to his wounds on the legs. Things are actually showing signs of good improvement and I am very pleased in that regard. I do not see any signs of infection at this time which is great news and in fact everything is pretty dry today. He does have his arteriogram with Dr. Durwin Nora on Friday. With that being said once that is complete I really think things will be significantly improved and hopefully he should be able to not only feel better but also continue with his own compression socks. 10-06-2021 upon evaluation today patient appears to be doing well with regard to his legs in fact there is very  little open at this point. Fortunately I do not see any signs of active infection which is great news. No fevers, chills, nausea, vomiting, or diarrhea. 10-13-2021 upon evaluation today patient's legs actually appear to be doing decently well. I am really seeing not much in the way of drainage at this point. Fortunately I think that he is on the right track and I believe that we are very close to complete resolution and discharge and at this point I do think that he is tolerating the compression wraps without complication. He has his appointment with vascular coming up on the 21st of this month which is just a week and a half away. 10-20-2021 upon evaluation today patient appears to be doing well currently in regard to his legs. There is very little open or draining at this point he actually has his procedure with vascular on Friday. Hopefully he will have much better blood flow following this procedure. 7/26; patient tells me that he went for revascularization on the left leg with an apparent stent but I did not look over this and care everywhere today. He arrives in clinic today with small wounds on the right lateral right medial leg more impressive areas on the left medial and left posterior. We have been using silver alginate and kerlix Coban. 11-03-2021 patient appears to be doing much better in regard to his legs at this point. He has been revascularized and seems to be doing excellent as far as that is concerned. Fortunately I see no evidence of active infection at this time which is great news. No fevers, chills, nausea, vomiting, or diarrhea. 11-10-2021 upon evaluation today patient actually appears to be showing signs of improvement in fact there is almost nothing open on either leg. I do believe that this is something that could do quite well as far as a Velcro compression wrap is concerned. He is definitely doing better than where we were previous which is great news and overall I am extremely  pleased in that regard. Nonetheless I do believe that in general he is getting need something to wear long-term for compression and I think juxta lite compression wraps will be the way to go. Electronic Signature(s) Signed: 11/12/2021 5:23:27 PM By: Lenda Kelp PA-C Entered By: Lenda Kelp on 11/12/2021 17:23:27 -------------------------------------------------------------------------------- Physical Exam Details Patient Name: Date of Service: [name], [name] 11/10/2021 10:45 A M Medical Record Number: [id number] Patient Account Number: [id number] Date of Birth/Sex: Treating RN: January 16, 1961 (60 y.o. [name], [name] Primary Care Provider: [name] Other Clinician: Referring Provider: Treating Provider/Extender: [name] in Treatment: 11 Constitutional Well-nourished and well-hydrated in no acute distress. Respiratory normal breathing without difficulty. Psychiatric this patient is able to make decisions and demonstrates good insight into disease process. Alert and Oriented x 3. pleasant and cooperative. Notes Upon inspection patient's  wound bed actually showed signs of good granulation and epithelization at this point. Fortunately there does not appear to be any signs of active infection locally or systemically which is great news. No fevers, chills, nausea, vomiting, or diarrhea. Electronic Signature(s) Signed: 11/12/2021 5:23:44 PM By: [name] PA-C Entered By: [name] on 11/12/2021 17:23:44 -------------------------------------------------------------------------------- Physician Orders Details Patient Name: Date of Service: [name], [name] 11/10/2021 10:45 A M Medical Record Number: [id number] Patient Account Number: [id number] Date of Birth/Sex: Treating RN: 07-May-1960 (61 y.o. [name], [name] Primary Care Provider: [name] Other Clinician: Referring Provider: Treating Provider/Extender: [name] in Treatment: [phone] Verbal / Phone Orders: No Diagnosis Coding ICD-10 Coding Code Description I89.0 Lymphedema, not elsewhere classified I87.333 Chronic venous hypertension (idiopathic) with ulcer and inflammation of bilateral lower extremity L97.822 Non-pressure chronic ulcer of other part of left lower leg with fat layer exposed L97.812 Non-pressure chronic ulcer of other part of right lower leg with fat layer exposed N18.30 Chronic kidney disease, stage 3 unspecified I50.42 Chronic combined systolic (congestive) and diastolic (congestive) heart failure I10 Essential (primary) hypertension 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 Jeri Cos, PA and [facility] 9 11/17/21 @ 1345 Return A Other: - Arteriography 10/22/21 w/ Dr. Scot Dock Bathing/ Shower/ Hygiene May shower with protection but do not get wound dressing(s) wet. Edema Control - Lymphedema / SCD / Other Elevate legs to the level of the heart or above for 30 minutes daily and/or when sitting, a frequency of: Avoid standing for long periods of time. Compression stocking or Garment 30-40 mm/Hg pressure to: - We are ordering juxtalites today for you. Wound Treatment Wound #5 - Lower Leg Wound Laterality: Right, Circumferential Cleanser: Soap and Water 2 x Per Week/7 Days Discharge Instructions: May shower and wash wound with dial antibacterial soap and water prior to dressing change. Peri-Wound Care: Triamcinolone 15 (g) 2 x Per Week/7 Days Discharge Instructions: Use triamcinolone 15 (g) as directed Peri-Wound Care: Sween Lotion (Moisturizing lotion) 2 x Per Week/7 Days Discharge Instructions: Apply moisturizing lotion as directed Prim Dressing: Maxorb Extra Calcium Alginate Dressing, 4x4 in 2 x Per Week/7 Days ary Discharge  Instructions: Apply calcium alginate to wound bed as instructed Secondary Dressing: ABD Pad, 5x9 2 x Per Week/7 Days Discharge Instructions: Apply over primary dressing as directed. Compression Wrap: ThreePress (3 layer compression wrap) 2 x Per Week/7 Days Discharge Instructions: Apply three layer compression as directed. Compression Stockings: Circaid Juxta Lite Compression Wrap (DME) Right Leg Compression Amount: 30-40 mmHG Discharge Instructions: Apply Circaid Juxta Lite Compression Wrap daily as instructed. Apply first thing in the morning, remove at night before bed. Wound #6 - Lower Leg Wound Laterality: Left, Circumferential Cleanser: Soap and Water 2 x Per Week/7 Days Discharge Instructions: May shower and wash wound with dial antibacterial soap and water prior to dressing change. Peri-Wound Care: Triamcinolone 15 (g) 2 x Per Week/7 Days Discharge Instructions: Use triamcinolone 15 (g) as directed Peri-Wound Care: Sween Lotion (Moisturizing lotion) 2 x Per Week/7 Days Discharge Instructions: Apply moisturizing lotion as directed Prim Dressing: Maxorb Extra Calcium Alginate Dressing, 4x4 in 2 x Per Week/7 Days ary Discharge Instructions: Apply calcium alginate to wound bed as instructed Secondary Dressing: ABD Pad, 5x9 2 x Per Week/7 Days Discharge Instructions: Apply over primary dressing as directed. Compression Wrap:  ThreePress (3 layer compression wrap) 2 x Per Week/7 Days Discharge Instructions: Apply three layer compression as directed. Compression Stockings: Circaid Juxta Lite Compression Wrap (DME) Left Leg Compression Amount: 30-40 mmHG Discharge Instructions: Apply Circaid Juxta Lite Compression Wrap daily as instructed. Apply first thing in the morning, remove at night before bed. Electronic Signature(s) Signed: 11/11/2021 3:04:24 PM By: [name] PA-C Signed: 11/11/2021 5:01:19 PM By: Rhae Hammock RN Entered By: Rhae Hammock on 11/11/2021  12:01:33 -------------------------------------------------------------------------------- Problem List Details Patient Name: Date of Service: [name], [name] 11/10/2021 10:45 A M Medical Record Number: [id number] Patient Account Number: [id number] Date of Birth/Sex: Treating RN: May 10, 1960 (61 y.o. [name], [name] Primary Care Provider: [name] Other Clinician: Referring Provider: Treating Provider/Extender: [name] in Treatment: 11 Active Problems ICD-10 Encounter Code Description Active Date MDM Diagnosis I89.0 Lymphedema, not elsewhere classified 08/25/2021 No Yes I87.333 Chronic venous hypertension (idiopathic) with ulcer and inflammation of 08/25/2021 No Yes bilateral lower extremity L97.822 Non-pressure chronic ulcer of other part of left lower leg with fat layer exposed5/24/2023 No Yes L97.812 Non-pressure chronic ulcer of other part of right lower leg with fat layer 08/25/2021 No Yes exposed N18.30 Chronic kidney disease, stage 3 unspecified 08/25/2021 No Yes I50.42 Chronic combined systolic (congestive) and diastolic (congestive) heart failure 08/25/2021 No Yes I10 Essential (primary) hypertension 08/25/2021 No Yes 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, right leg 09/08/2021 No Yes I70.248 Atherosclerosis of native arteries of left leg with ulceration of other part of 09/08/2021 No Yes lower leg Inactive Problems Resolved Problems Electronic Signature(s) Signed: 11/10/2021 10:43:44 AM By: [name] PA-C Entered By: [name] on 11/10/2021 10:43:44 -------------------------------------------------------------------------------- Progress Note Details Patient Name: Date of Service: [name], [name] 11/10/2021 10:45 A M Medical Record Number: [id number] Patient Account Number: [id number] Date of Birth/Sex: Treating RN: 04-Feb-1961 (61 y.o. [name], [name] Primary Care Provider: [name] Other  Clinician: Referring Provider: Treating Provider/Extender: [name] in Treatment: 11 Subjective Chief Complaint Information obtained from Patient Bilateral Leg Ulcers History of Present Illness (HPI) 02/24/2020 on evaluation today patient presents with obvious chronic lymphedema of the bilateral lower extremities. Fortunately there is no signs of active infection at this time which is great news. With that being said the patient does have a recent treatment with doxycycline 100 mg for 10 days which he has completed. He does have a history of chronic kidney disease stage III as documented in his chart. He also has chronic venous insufficiency with lower extremity lymphedema noted. He has congestive heart failure and hypertension. He tells me currently that he was unaware of the kidney disease before he mentioned this today. He also tells me that his primary care provider had told him to quit taking showers based on what was going on with his legs at this time. Fortunately there is no signs of active infection at this time which is great news. No fevers, chills, nausea, vomiting, or diarrhea. 03/04/2020 on evaluation today patient actually appears to be doing much better in regard to his legs. There is really not much draining a lot of the dry skin is starting to loosen up amount of time to get as much of this off today as possible 03/11/2020 upon evaluation today patient appears to be doing well currently in regard to his legs. He still has a lot of dry skin some which I was able to get off today but overall I think that the  biggest thing he needs right now is to be able to wash his legs in the shower to loosen some of this up which I think will then come off much more effectively and quickly without as much of an issue. Fortunately there is no evidence of active infection at this time. No fevers, chills, nausea, vomiting, or diarrhea. Readmission: 06/05/2020 upon  evaluation today patient appears to be doing worse in regard to his bilateral lower extremities. Unfortunately he has had some issues here with his leg started to swell again he tells me he was not able to wear the compression stockings that he got from elastic therapy. He states when he called the leg he said that the measurements did not seem right and subsequently just sent him something she thought would work. Either way he tells me they were very tight and very uncomfortable he just was not able to stick with it. Fortunately there is no signs of active infection at this time. No fevers, chills, nausea, vomiting, or diarrhea. 06/12/2020 on evaluation today patient actually appears to be doing well in regard to his legs. He has a lot of dry skin but nothing that appears to be open at this point. He did not get the Velcro compression wraps but does have the pull up compression stockings which if he wears those can definitely be sufficient here. Readmission: 08-25-2021 upon evaluation today patient appears to be doing well with regard to his legs all things considered. With that being said I do not see anything that actively looks like it is open or draining but I definitely see spots where he had blistering and obvious signs of drainage. Subsequently he is supposed to be having open heart surgery to replace a valve that sounds like but they are not able to do that simply due to the fact that right now he is having more issues currently with his legs and they will not do anything until that is resolved. Fortunately I do not see any evidence of active infection locally or systemically which is great news. No fevers, chills, nausea, vomiting, or diarrhea. The patient does have a history noted previously of lymphedema, chronic venous hypertension, chronic kidney disease stage III, congestive heart failure, and hypertension. 09-01-2021 upon evaluation today patient actually appears to be healed I do not see  anything open at this time which is great news. Fortunately he is doing excellent and I think they were on the right track. With that being said there were a couple areas where he was so dry that the wrap actually stuck to his leg and this has caused some issues here with a couple small openings remaining but I think by next week these will be sealed up. Overall I think that he needs some lotion twice a day and Ace wraps are probably to be the best way to go to help with compression for now. He does have the vascular appointment upcoming in a couple of days. 6/7; patient comes in today with quite a deterioration from last week. Coupled with this is that he had his arterial studies which were really not very good on the right his ABI was 0.92 and on the left 1.09 but TBI was 0.52 on the right and 0.36 on the left he had monophasic waveforms almost diffusely on the right with an estimated 50 to 74% stenosis of the distal SFA on the left he had similar findings. 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 We are working on getting him a vascular evaluation./Consultation Also looking through his chart he has a lot of other medical problems including severe aortic stenosis and he has been referred to cardiac surgery. He has atrial fibrillation on Eliquis 09-15-2021 upon evaluation today patient appears to be doing better than it sounds like he was last week. Fortunately I do not see any evidence of active infection locally or systemically which is great news. No fevers, chills, nausea, vomiting, or diarrhea. He does have an appointment with vascular tomorrow. 09-22-2021 upon evaluation today patient appears to be doing well with regard to his legs all things considered. I did review his note and it appears that he has significant peripheral vascular  disease bilaterally in the lower extremities. Subsequently this seems to be even a femoral and iliac arteries. Subsequently I do believe that the arteriogram is probably going to be a good way to go as far as trying to improve his blood flow as noted in the record from Dr. Waverly Ferrari on 09-16-2021. 09-29-2021 upon evaluation today patient appears to be doing decently well today in regard to his wounds on the legs. Things are actually showing signs of good improvement and I am very pleased in that regard. I do not see any signs of infection at this time which is great news and in fact everything is pretty dry today. He does have his arteriogram with Dr. Durwin Nora on Friday. With that being said once that is complete I really think things will be significantly improved and hopefully he should be able to not only feel better but also continue with his own compression socks. 10-06-2021 upon evaluation today patient appears to be doing well with regard to his legs in fact there is very little open at this point. Fortunately I do not see any signs of active infection which is great news. No fevers, chills, nausea, vomiting, or diarrhea. 10-13-2021 upon evaluation today patient's legs actually appear to be doing decently well. I am really seeing not much in the way of drainage at this point. Fortunately I think that he is on the right track and I believe that we are very close to complete resolution and discharge and at this point I do think that he is tolerating the compression wraps without complication. He has his appointment with vascular coming up on the 21st of this month which is just a week and a half away. 10-20-2021 upon evaluation today patient appears to be doing well currently in regard to his legs. There is very little open or draining at this point he actually has his procedure with vascular on Friday. Hopefully he will have much better blood flow following this procedure. 7/26; patient tells  me that he went for revascularization on the left leg with an apparent stent but I did not look over this and care everywhere today. He arrives in clinic today with small wounds on the right lateral right medial leg more impressive areas on the left medial and left posterior. We have been using silver alginate and kerlix Coban. 11-03-2021 patient appears to be doing much better in regard to his legs at this point. He has been revascularized and seems to be doing excellent as far as that is concerned. Fortunately I see no evidence of active infection at this time which is great news. No fevers, chills, nausea, vomiting, or diarrhea. 11-10-2021  upon evaluation today patient actually appears to be showing signs of improvement in fact there is almost nothing open on either leg. I do believe that this is something that could do quite well as far as a Velcro compression wrap is concerned. He is definitely doing better than where we were previous which is great news and overall I am extremely pleased in that regard. Nonetheless I do believe that in general he is getting need something to wear long-term for compression and I think juxta lite compression wraps will be the way to go. Objective Constitutional Well-nourished and well-hydrated in no acute distress. Vitals Time Taken: 11:19 AM, Height: 69 in, Weight: 215 lbs, BMI: 31.7, Temperature: 97.8 F, Pulse: 81 bpm, Respiratory Rate: 18 breaths/min, Blood Pressure: 143/97 mmHg. Respiratory normal breathing without difficulty. Psychiatric this patient is able to make decisions and demonstrates good insight into disease process. Alert and Oriented x 3. pleasant and cooperative. General Notes: Upon inspection patient's wound bed actually showed signs of good granulation and epithelization at this point. Fortunately there does not appear to be any signs of active infection locally or systemically which is great news. No fevers, chills, nausea, vomiting, or  diarrhea. Integumentary (Hair, Skin) Wound #5 status is Open. Original cause of wound was Gradually Appeared. The date acquired was: 07/03/2021. The wound has been in treatment 11 weeks. The wound is located on the Right,Circumferential Lower Leg. The wound measures 0.1cm length x 0.1cm width x 0.1cm depth; 0.008cm^2 area and 0.001cm^3 volume. There is Fat Layer (Subcutaneous Tissue) exposed. There is no tunneling or undermining noted. There is a medium amount of serosanguineous drainage noted. The wound margin is distinct with the outline attached to the wound base. There is large (67-100%) red, pink granulation within the wound bed. There is no necrotic tissue within the wound bed. Wound #6 status is Open. Original cause of wound was Gradually Appeared. The date acquired was: 07/03/2021. The wound has been in treatment 11 weeks. The wound is located on the Left,Circumferential Lower Leg. The wound measures 0.1cm length x 0.1cm width x 0.1cm depth; 0.008cm^2 area and 0.001cm^3 volume. There is Fat Layer (Subcutaneous Tissue) exposed. There is no tunneling or undermining noted. There is a medium amount of serosanguineous drainage noted. The wound margin is distinct with the outline attached to the wound base. There is large (67-100%) red, pink granulation within the wound bed. There is no necrotic tissue within the wound bed. 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 There was a Three Layer Compression Therapy Procedure by Rhae Hammock, RN. Post procedure Diagnosis Wound #5: Same as Pre-Procedure Wound #6 Pre-procedure diagnosis of Wound #6 is a Venous Leg Ulcer located on the Left,Circumferential Lower Leg . There was a Three Layer Compression Therapy Procedure by Rhae Hammock, RN. Post procedure Diagnosis Wound #6: Same as Pre-Procedure Plan Follow-up Appointments: Return Appointment in 1 week. Margarita Grizzle  [place], [place] 11/17/21 @ [id number] Other: - Arteriography 10/22/21 w/ Dr. Scot Dock Bathing/ Shower/ Hygiene: May shower with protection but do not get wound dressing(s) wet. Edema Control - Lymphedema / SCD / Other: Elevate legs to the level of the heart or above for 30 minutes daily and/or when sitting, a frequency of: Avoid standing for long periods of time. Compression stocking or Garment 30-40 mm/Hg pressure to: - We are ordering juxtalites today for you. WOUND #5: - Lower Leg Wound Laterality: Right, Circumferential Cleanser: Soap and Water 2 x Per Week/7 Days Discharge Instructions: May shower and wash wound with dial antibacterial soap and water prior to dressing change. Peri-Wound Care: Triamcinolone 15 (g) 2 x Per Week/7 Days Discharge Instructions: Use triamcinolone 15 (g) as directed Peri-Wound Care: Sween Lotion (Moisturizing lotion) 2 x Per Week/7 Days Discharge Instructions: Apply moisturizing lotion as directed Prim Dressing: Maxorb Extra Calcium Alginate Dressing, 4x4 in 2 x Per Week/7 Days ary Discharge Instructions: Apply calcium alginate to wound bed as instructed Secondary Dressing: ABD Pad, 5x9 2 x Per Week/7 Days Discharge Instructions: Apply over primary dressing as directed. Com pression Wrap: ThreePress (3  layer compression wrap) 2 x Per Week/7 Days Discharge Instructions: Apply three layer compression as directed. Com pression Stockings: Circaid Juxta Lite Compression Wrap (DME) Compression Amount: 30-40 mmHg (right) Discharge Instructions: Apply Circaid Juxta Lite Compression Wrap daily as instructed. Apply first thing in the morning, remove at night before bed. WOUND #6: - Lower Leg Wound Laterality: Left, Circumferential Cleanser: Soap and Water 2 x Per Week/7 Days Discharge Instructions: May shower and wash wound with dial antibacterial soap and water prior to dressing change. Peri-Wound Care: Triamcinolone 15 (g) 2 x Per Week/7 Days Discharge Instructions: Use triamcinolone 15 (g) as directed Peri-Wound Care: Sween Lotion (Moisturizing lotion) 2 x Per Week/7 Days Discharge Instructions: Apply moisturizing lotion as directed Prim Dressing: Maxorb Extra Calcium Alginate Dressing, 4x4 in 2 x Per Week/7 Days ary Discharge Instructions: Apply calcium alginate to wound bed as instructed Secondary Dressing: ABD Pad, 5x9 2 x Per Week/7 Days Discharge Instructions: Apply over primary dressing as directed. Com pression Wrap: ThreePress (3 layer compression wrap) 2 x Per Week/7 Days Discharge Instructions: Apply three layer compression as directed. Com pression Stockings: Circaid Juxta Lite Compression Wrap (DME) Compression Amount: 30-40 mmHg (left) Discharge Instructions: Apply Circaid Juxta Lite Compression Wrap daily as instructed. Apply first thing in the morning, remove at night before bed. 1. I am going to recommend currently based on what we are seeing that we continue with the compression wraps at this point. I do feel like that that has done well for him and my hope is that he will continue to show signs of improvement going forward. 2. I am also can recommend that we have the patient continue with the triamcinolone as well as the lotion mixed which I think is doing quite well. 3. We  will get a go ahead and see about ordering the juxta lite compression wraps which I think will also be of benefit. The patient voiced understanding. We will see patient back for reevaluation in 1 week here in the clinic. If anything worsens or changes patient will contact our office for additional recommendations. Electronic Signature(s) Signed: 11/12/2021 5:24:54 PM By: [name] PA-C Entered By: [name] on 11/12/2021 17:24:54 -------------------------------------------------------------------------------- SuperBill Details Patient Name: Date of Service: [name], [name] 11/10/2021 Medical Record Number: [id number] Patient Account Number: [id number] Date of Birth/Sex: Treating  RN: Nov 05, 1960 (61 y.o. 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 L97.822 Non-pressure chronic ulcer of other part of left lower leg with fat layer exposed L97.812 Non-pressure chronic ulcer of other part of right lower leg with fat layer exposed N18.30 Chronic kidney disease, stage 3 unspecified I50.42 Chronic combined systolic (congestive) and diastolic (congestive) heart failure I10 Essential (primary) hypertension 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 Modifier: Quantity: 1 Physician Procedures Electronic Signature(s) Signed: 11/12/2021 5:25:08 PM By: [name]  PA-C Previous Signature: 11/11/2021 3:04:24 PM Version By: [name] PA-C Previous Signature: 11/11/2021 5:01:19 PM Version By: Rhae Hammock RN Entered By: [name] on 11/12/2021 17:25:08

## 2021-11-11 NOTE — Progress Notes (Signed)
TOA, MIA ([id number]) Visit Report for 11/10/2021 Arrival Information Details Patient Name: Date of Service: [name], [name] 11/10/2021 10:45 A M Medical Record Number: [id number] Patient Account Number: [id number] Date of Birth/Sex: Treating RN: April 27, 1960 (61 y.o. [name], [name] Primary Care Airabella Barley: [name] Other Clinician: Referring Paralee Pendergrass: Treating Pepper Kerrick/Extender: [name] in Treatment: 11 Visit Information History Since Last Visit Added or deleted any medications: Yes Patient Arrived: Ambulatory Any new allergies or adverse reactions: No Arrival Time: 11:17 Had a fall or experienced change in No Transfer Assistance: None activities of daily living that may affect Patient Identification Verified: Yes risk of falls: Secondary Verification Process Completed: Yes Signs or symptoms of abuse/neglect since last visito No Patient Requires Transmission-Based Precautions: No Hospitalized since last visit: No Patient Has Alerts: No Implantable device outside of the clinic excluding No cellular tissue based products placed in the center since last visit: Has Dressing in Place as Prescribed: Yes Has Compression in Place as Prescribed: Yes Pain Present Now: No Electronic Signature(s) Signed: 11/11/2021 4:44:02 PM By: Thayer Dallas Entered By: Thayer Dallas on 11/10/2021 11:18:55 -------------------------------------------------------------------------------- Compression Therapy Details Patient Name: Date of Service: [name], [name] 11/10/2021 10:45 A M Medical Record Number: [id number] Patient Account Number: [id number] Date of Birth/Sex: Treating RN: 05-08-1960 (61 y.o. [name], [name] Primary Care Yides Saidi: [name] Other Clinician: Referring Hadas Jessop: Treating Kilee Hedding/Extender: [name] in Treatment: 11 Compression Therapy Performed for Wound Assessment: Wound #5 Right,Circumferential Lower  Leg Performed By: Clinician Fonnie Mu, RN Compression Type: Three Layer Post Procedure Diagnosis Same as Pre-procedure Electronic Signature(s) Signed: 11/11/2021 5:01:19 PM By: Fonnie Mu RN Entered By: Fonnie Mu on 11/10/2021 12:05:11 -------------------------------------------------------------------------------- Compression Therapy Details Patient Name: Date of Service: [name], [name] 11/10/2021 10:45 A M Medical Record Number: [id number] Patient Account Number: [id number] Date of Birth/Sex: Treating RN: November 09, 1960 (61 y.o. [name], [name] Primary Care Japheth Diekman: [name] Other Clinician: Referring Bassy Fetterly: Treating Meia Emley/Extender: [name] in Treatment: 11 Compression Therapy Performed for Wound Assessment: Wound #6 Left,Circumferential Lower Leg Performed By: Clinician Fonnie Mu, RN Compression Type: Three Layer Post Procedure Diagnosis Same as Pre-procedure Electronic Signature(s) Signed: 11/11/2021 5:01:19 PM By: Fonnie Mu RN Entered By: Fonnie Mu on 11/10/2021 12:05:11 -------------------------------------------------------------------------------- Encounter Discharge Information Details Patient Name: Date of Service: [name], [name] 11/10/2021 10:45 A M Medical Record Number: [id number] Patient Account Number: [id number] Date of Birth/Sex: Treating RN: Aug 14, 1960 (61 y.o. [name], [name] Primary Care Konica Stankowski: [name] Other Clinician: Referring Dezyre Hoefer: Treating Braylin Formby/Extender: [name] in Treatment: 11 Encounter Discharge Information Items Discharge Condition: Stable Ambulatory Status: Ambulatory Discharge Destination: Home Transportation: Private Auto Accompanied By: self Schedule Follow-up Appointment: Yes Clinical Summary of Care: Patient Declined Electronic Signature(s) Signed: 11/11/2021 5:01:19 PM By: Fonnie Mu  RN Entered By: Fonnie Mu on 11/10/2021 12:25:01 -------------------------------------------------------------------------------- Lower Extremity Assessment Details Patient Name: Date of Service: [name], [name] 11/10/2021 10:45 A M Medical Record Number: [id number] Patient Account Number: [id number] Date of Birth/Sex: Treating RN: 1960-09-22 (61 y.o. [name], [name] Primary Care Reyanna Baley: [name] Other Clinician: Referring Jehu Mccauslin: Treating Maaliyah Adolph/Extender: [name] in Treatment: 11 Edema Assessment Assessed: [name]: No] [name]: No] Edema: [Left: Yes] [Right: Yes] Calf Left: Right: Point of Measurement: From Medial Instep 35.2 cm 33.5 cm Ankle Left: Right: Point of Measurement: From Medial Instep 22.5 cm 23.5 cm Electronic Signature(s) Signed: 11/11/2021 4:44:02 PM By: Thayer Dallas Signed: 11/11/2021 5:01:19  PM By: Fonnie Mu RN Entered By: Thayer Dallas on 11/10/2021 11:50:18 -------------------------------------------------------------------------------- Multi-Disciplinary Care Plan Details Patient Name: Date of Service: [name], [name] 11/10/2021 10:45 A M Medical Record Number: [id number] Patient Account Number: [id number] Date of Birth/Sex: Treating RN: 19-Jun-1960 (61 y.o. [name], [name] Primary Care Saide Lanuza: [name] Other Clinician: Referring Smith Potenza: Treating Rajohn Henery/Extender: [name] in Treatment: 11 Active Inactive Wound/Skin Impairment Nursing Diagnoses: Impaired tissue integrity Knowledge deficit related to ulceration/compromised skin integrity Goals: Patient/caregiver will verbalize understanding of skin care regimen Date Initiated: 08/25/2021 Target Resolution Date: 11/25/2021 Goal Status: Active Ulcer/skin breakdown will have a volume reduction of 30% by week 4 Date Initiated: 08/25/2021 Target Resolution Date: 11/25/2021 Goal Status:  Active Interventions: Assess patient/caregiver ability to obtain necessary supplies Assess patient/caregiver ability to perform ulcer/skin care regimen upon admission and as needed Assess ulceration(s) every visit Notes: Electronic Signature(s) Signed: 11/11/2021 5:01:19 PM By: Fonnie Mu RN Entered By: Fonnie Mu on 11/10/2021 12:22:39 -------------------------------------------------------------------------------- Pain Assessment Details Patient Name: Date of Service: [name], [name] 11/10/2021 10:45 A M Medical Record Number: [id number] Patient Account Number: [id number] Date of Birth/Sex: Treating RN: 1960-12-08 (61 y.o. [name], [name] Primary Care Boubacar Lerette: [name] Other Clinician: Referring Kaleisha Bhargava: Treating Jarriel Papillion/Extender: [name] in Treatment: 11 Active Problems Location of Pain Severity and Description of Pain Patient Has Paino No Site Locations Pain Management and Medication Current Pain Management: Electronic Signature(s) Signed: 11/11/2021 4:44:02 PM By: Thayer Dallas Signed: 11/11/2021 5:01:19 PM By: Fonnie Mu RN Entered By: Thayer Dallas on 11/10/2021 11:21:14 -------------------------------------------------------------------------------- Patient/Caregiver Education Details Patient Name: Date of Service: [name] 8/9/2023andnbsp10:45 A M Medical Record Number: [id number] Patient Account Number: [id number] Date of Birth/Gender: Treating RN: November 04, 1960 (61 y.o. Lucious Groves Primary Care Physician: [name] Other Clinician: Referring Physician: Treating Physician/Extender: [name] in Treatment: 11 Education Assessment Education Provided To: Patient Education Topics Provided Wound/Skin Impairment: Methods: Explain/Verbal Responses: Reinforcements needed, State content correctly [employer]) Signed: 11/11/2021 5:01:19 PM By:  Fonnie Mu RN Entered By: Fonnie Mu on 11/10/2021 12:23:57 -------------------------------------------------------------------------------- Wound Assessment Details Patient Name: Date of Service: [name], [name] 11/10/2021 10:45 A M Medical Record Number: [id number] Patient Account Number: [id number] Date of Birth/Sex: Treating RN: 1961/01/22 (61 y.o. [name], [name] Primary Care Ife Vitelli: [name] Other Clinician: Referring Latravious Levitt: Treating Charlet Harr/Extender: [name] in Treatment: 11 Wound Status Wound Number: 5 Primary Etiology: Venous Leg Ulcer Wound Location: Right, Circumferential Lower Leg Wound Status: Open Wounding Event: Gradually Appeared Comorbid History: Congestive Heart Failure, Hypertension Date Acquired: 07/03/2021 Weeks Of Treatment: 11 Clustered Wound: No Photos Wound Measurements Length: (cm) 0.1 Width: (cm) 0.1 Depth: (cm) 0.1 Area: (cm) 0.008 Volume: (cm) 0.001 % Reduction in Area: 100% % Reduction in Volume: 100% Epithelialization: Large (67-100%) Tunneling: No Undermining: No Wound Description Classification: Full Thickness With Exposed Support Structures Wound Margin: Distinct, outline attached Exudate Amount: Medium Exudate Type: Serosanguineous Exudate Color: red, brown Foul Odor After Cleansing: No Slough/Fibrino No Wound Bed Granulation Amount: Large (67-100%) Exposed Structure Granulation Quality: Red, Pink Fascia Exposed: No Necrotic Amount: None Present (0%) Fat Layer (Subcutaneous Tissue) Exposed: Yes Tendon Exposed: No Muscle Exposed: No Joint Exposed: No Bone Exposed: No Treatment Notes Wound #5 (Lower Leg) Wound Laterality: Right, Circumferential Cleanser Soap and Water Discharge Instruction: May shower and wash wound with dial antibacterial soap and water prior to dressing change. Peri-Wound Care Triamcinolone 15 (g) Discharge Instruction: Use triamcinolone  15 (g) as  directed Sween Lotion (Moisturizing lotion) Discharge Instruction: Apply moisturizing lotion as directed Topical Primary Dressing Maxorb Extra Calcium Alginate Dressing, 4x4 in Discharge Instruction: Apply calcium alginate to wound bed as instructed Secondary Dressing ABD Pad, 5x9 Discharge Instruction: Apply over primary dressing as directed. Secured With Compression Wrap ThreePress (3 layer compression wrap) Discharge Instruction: Apply three layer compression as directed. Compression Stockings Add-Ons Electronic Signature(s) Signed: 11/11/2021 5:01:19 PM By: Fonnie Mu RN Entered By: Fonnie Mu on 11/10/2021 12:03:45 -------------------------------------------------------------------------------- Wound Assessment Details Patient Name: Date of Service: [name], [name] 11/10/2021 10:45 A M Medical Record Number: [id number] Patient Account Number: [id number] Date of Birth/Sex: Treating RN: June 07, 1960 (61 y.o. [name], [name] Primary Care Cayton Cuevas: [name] Other Clinician: Referring Shonte Beutler: Treating Derinda Bartus/Extender: [name] in Treatment: 11 Wound Status Wound Number: 6 Primary Etiology: Venous Leg Ulcer Wound Location: Left, Circumferential Lower Leg Wound Status: Open Wounding Event: Gradually Appeared Comorbid History: Congestive Heart Failure, Hypertension Date Acquired: 07/03/2021 Weeks Of Treatment: 11 Clustered Wound: No Photos Wound Measurements Length: (cm) 0.1 Width: (cm) 0.1 Depth: (cm) 0.1 Area: (cm) 0.008 Volume: (cm) 0.001 % Reduction in Area: 100% % Reduction in Volume: 100% Epithelialization: Large (67-100%) Tunneling: No Undermining: No Wound Description Classification: Full Thickness With Exposed Support Structures Wound Margin: Distinct, outline attached Exudate Amount: Medium Exudate Type: Serosanguineous Exudate Color: red, brown Foul Odor After Cleansing: No Slough/Fibrino No Wound  Bed Granulation Amount: Large (67-100%) Exposed Structure Granulation Quality: Red, Pink Fascia Exposed: No Necrotic Amount: None Present (0%) Fat Layer (Subcutaneous Tissue) Exposed: Yes Tendon Exposed: No Muscle Exposed: No Joint Exposed: No Bone Exposed: No Treatment Notes Wound #6 (Lower Leg) Wound Laterality: Left, Circumferential Cleanser Soap and Water Discharge Instruction: May shower and wash wound with dial antibacterial soap and water prior to dressing change. Peri-Wound Care Triamcinolone 15 (g) Discharge Instruction: Use triamcinolone 15 (g) as directed Sween Lotion (Moisturizing lotion) Discharge Instruction: Apply moisturizing lotion as directed Topical Primary Dressing Maxorb Extra Calcium Alginate Dressing, 4x4 in Discharge Instruction: Apply calcium alginate to wound bed as instructed Secondary Dressing ABD Pad, 5x9 Discharge Instruction: Apply over primary dressing as directed. Secured With Compression Wrap ThreePress (3 layer compression wrap) Discharge Instruction: Apply three layer compression as directed. Compression Stockings Add-Ons Electronic Signature(s) Signed: 11/11/2021 5:01:19 PM By: Fonnie Mu RN Entered By: Fonnie Mu on 11/10/2021 12:04:54 -------------------------------------------------------------------------------- Vitals Details Patient Name: Date of Service: [name], [name] 11/10/2021 10:45 A M Medical Record Number: [id number] Patient Account Number: [id number] Date of Birth/Sex: Treating RN: 09/29/1960 (61 y.o. [name], [name] Primary Care Lopez Dentinger: [name] Other Clinician: Referring Shan Padgett: Treating Inaya Gillham/Extender: [name] in Treatment: 11 Vital Signs Time Taken: 11:19 Temperature (F): 97.8 Height (in): 69 Pulse (bpm): 81 Weight (lbs): 215 Respiratory Rate (breaths/min): 18 Body Mass Index (BMI): 31.7 Blood Pressure (mmHg): 143/97 Reference Range: 80 - 120 mg  / dl Electronic Signature(s) Signed: 11/11/2021 4:44:02 PM By: Thayer Dallas Entered By: Thayer Dallas on 11/10/2021 11:21:03

## 2021-11-12 DIAGNOSIS — I872 Venous insufficiency (chronic) (peripheral): Secondary | ICD-10-CM | POA: Diagnosis not present

## 2021-11-12 DIAGNOSIS — I87333 Chronic venous hypertension (idiopathic) with ulcer and inflammation of bilateral lower extremity: Secondary | ICD-10-CM | POA: Diagnosis not present

## 2021-11-13 ENCOUNTER — Telehealth: Payer: Self-pay | Admitting: *Deleted

## 2021-11-13 DIAGNOSIS — R400 Somnolence: Secondary | ICD-10-CM

## 2021-11-13 DIAGNOSIS — G4733 Obstructive sleep apnea (adult) (pediatric): Secondary | ICD-10-CM

## 2021-11-13 NOTE — Telephone Encounter (Signed)
-----   Message from Quintella Reichert, MD sent at 11/08/2021  2:03 PM EDT ----- Coralee North I ordered a BiPAP titration but he has not had it done yet>>Can you check on this for me please  Traci ----- Message ----- From: Storm Frisk, MD Sent: 11/08/2021   1:57 PM EDT To: Quintella Reichert, MD  Traci this pt is waitingon a bipap titration study will you be ordering this???  He is uncertain   BP still high

## 2021-11-13 NOTE — Telephone Encounter (Signed)
Cpap/bipap titration resubmitted the first authorization ran out. Prior Authorization for titration sent to [facility] via web portal.  Prior Authorization is not required for the requested services  Decision ID #:[id number]

## 2021-11-13 NOTE — Addendum Note (Signed)
Addended by: Reesa Chew on: 11/13/2021 06:50 PM   Modules accepted: Orders

## 2021-11-13 NOTE — Telephone Encounter (Addendum)
Patients first authorization ran out 8/1 because it never got scheduled by the sleep lab. It has been resubmitted again.

## 2021-11-15 NOTE — Telephone Encounter (Signed)
Prior Authorization for bIPAP TITRATION sent to [name] via web portal. Tracking Number [facility]- Prior Authorization is not required .Decision ID #:[id number].

## 2021-11-17 ENCOUNTER — Encounter (HOSPITAL_BASED_OUTPATIENT_CLINIC_OR_DEPARTMENT_OTHER): Payer: Medicare Other | Admitting: Physician Assistant

## 2021-11-17 DIAGNOSIS — L97812 Non-pressure chronic ulcer of other part of right lower leg with fat layer exposed: Secondary | ICD-10-CM | POA: Diagnosis not present

## 2021-11-17 DIAGNOSIS — I87333 Chronic venous hypertension (idiopathic) with ulcer and inflammation of bilateral lower extremity: Secondary | ICD-10-CM | POA: Diagnosis not present

## 2021-11-17 DIAGNOSIS — I890 Lymphedema, not elsewhere classified: Secondary | ICD-10-CM | POA: Diagnosis not present

## 2021-11-17 DIAGNOSIS — I70248 Atherosclerosis of native arteries of left leg with ulceration of other part of lower left leg: Secondary | ICD-10-CM | POA: Diagnosis not present

## 2021-11-17 DIAGNOSIS — I5042 Chronic combined systolic (congestive) and diastolic (congestive) heart failure: Secondary | ICD-10-CM | POA: Diagnosis not present

## 2021-11-17 DIAGNOSIS — N183 Chronic kidney disease, stage 3 unspecified: Secondary | ICD-10-CM | POA: Diagnosis not present

## 2021-11-17 DIAGNOSIS — I130 Hypertensive heart and chronic kidney disease with heart failure and stage 1 through stage 4 chronic kidney disease, or unspecified chronic kidney disease: Secondary | ICD-10-CM | POA: Diagnosis not present

## 2021-11-17 DIAGNOSIS — Z7901 Long term (current) use of anticoagulants: Secondary | ICD-10-CM | POA: Diagnosis not present

## 2021-11-17 DIAGNOSIS — I872 Venous insufficiency (chronic) (peripheral): Secondary | ICD-10-CM | POA: Diagnosis not present

## 2021-11-17 DIAGNOSIS — I350 Nonrheumatic aortic (valve) stenosis: Secondary | ICD-10-CM | POA: Diagnosis not present

## 2021-11-17 DIAGNOSIS — I509 Heart failure, unspecified: Secondary | ICD-10-CM | POA: Diagnosis not present

## 2021-11-17 DIAGNOSIS — L97822 Non-pressure chronic ulcer of other part of left lower leg with fat layer exposed: Secondary | ICD-10-CM | POA: Diagnosis not present

## 2021-11-17 NOTE — Progress Notes (Addendum)
[name], [name] ([id number]) Visit Report for 11/17/2021 Chief Complaint Document Details Patient Name: Date of Service: [name], [name] 11/17/2021 1:45 PM Medical Record Number: [id number] Patient Account Number: [id number] Date of Birth/Sex: Treating RN: 1960/08/08 (61 y.o. M) Primary Care Provider: [name] Other Clinician: Referring Provider: Treating Provider/Extender: [name] in Treatment: 12 Information Obtained from: Patient Chief Complaint Bilateral Leg Ulcers Electronic Signature(s) Signed: 11/17/2021 1:45:49 PM By: [name] PA-C Entered By: [name] on 11/17/2021 13:45:49 -------------------------------------------------------------------------------- HPI Details Patient Name: Date of Service: [name], [name] 11/17/2021 1:45 PM Medical Record Number: [id number] Patient Account Number: [id number] Date of Birth/Sex: Treating RN: [birth year] (61 y.o. M) Primary Care Provider: [name] Other Clinician: Referring Provider: Treating Provider/Extender: [name] in Treatment: 12 History of Present Illness HPI Description: 02/24/2020 on evaluation today patient presents with obvious chronic lymphedema of the bilateral lower extremities. Fortunately there is no signs of active infection at this time which is great news. With that being said the patient does have a recent treatment with doxycycline 100 mg for 10 days which he has completed. He does have a history of chronic kidney disease stage III as documented in his chart. He also has chronic venous insufficiency with lower extremity lymphedema noted. He has congestive heart failure and hypertension. He tells me currently that he was unaware of the kidney disease before he mentioned this today. He also tells me that his primary care provider had told him to quit taking showers based on what was going on with his legs at this time. Fortunately there is no signs  of active infection at this time which is great news. No fevers, chills, nausea, vomiting, or diarrhea. 03/04/2020 on evaluation today patient actually appears to be doing much better in regard to his legs. There is really not much draining a lot of the dry skin is starting to loosen up amount of time to get as much of this off today as possible 03/11/2020 upon evaluation today patient appears to be doing well currently in regard to his legs. He still has a lot of dry skin some which I was able to get off today but overall I think that the biggest thing he needs right now is to be able to wash his legs in the shower to loosen some of this up which I think will then come off much more effectively and quickly without as much of an issue. Fortunately there is no evidence of active infection at this time. No fevers, chills, nausea, vomiting, or diarrhea. Readmission: 06/05/2020 upon evaluation today patient appears to be doing worse in regard to his bilateral lower extremities. Unfortunately he has had some issues here with his leg started to swell again he tells me he was not able to wear the compression stockings that he got from elastic therapy. He states when he called the leg he said that the measurements did not seem right and subsequently just sent him something she thought would work. Either way he tells me they were very tight and very uncomfortable he just was not able to stick with it. Fortunately there is no signs of active infection at this time. No fevers, chills, nausea, vomiting, or diarrhea. 06/12/2020 on evaluation today patient actually appears to be doing well in regard to his legs. He has a lot of dry skin but nothing that appears to be open at this point. He did not get the Velcro compression wraps but  does have the pull up compression stockings which if he wears those can definitely be sufficient here. Readmission: 08-25-2021 upon evaluation today patient appears to be doing well with  regard to his legs all things considered. With that being said I do not see anything that actively looks like it is open or draining but I definitely see spots where he had blistering and obvious signs of drainage. Subsequently he is supposed to be having open heart surgery to replace a valve that sounds like but they are not able to do that simply due to the fact that right now he is having more issues currently with his legs and they will not do anything until that is resolved. Fortunately I do not see any evidence of active infection locally or systemically which is great news. No fevers, chills, nausea, vomiting, or diarrhea. The patient does have a history noted previously of lymphedema, chronic venous hypertension, chronic kidney disease stage III, congestive heart failure, and hypertension. 09-01-2021 upon evaluation today patient actually appears to be healed I do not see anything open at this time which is great news. Fortunately he is doing excellent and I think they were on the right track. With that being said there were a couple areas where he was so dry that the wrap actually stuck to his leg and this has caused some issues here with a couple small openings remaining but I think by next week these will be sealed up. Overall I think that he needs some lotion twice a day and Ace wraps are probably to be the best way to go to help with compression for now. He does have the vascular appointment upcoming in a couple of days. 6/7; patient comes in today with quite a deterioration from last week. Coupled with this is that he had his arterial studies which were really not very good on the right his ABI was 0.92 and on the left 1.09 but TBI was 0.52 on the right and 0.36 on the left he had monophasic waveforms almost diffusely on the right with an estimated 50 to 74% stenosis of the distal SFA on the left he had similar findings. 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 We are working on getting him a vascular evaluation./Consultation Also looking through his chart he has a lot of other medical problems including severe aortic stenosis and he has been referred to cardiac surgery. He has atrial fibrillation on Eliquis 09-15-2021 upon evaluation today patient appears to be doing better than it sounds like he was last week. Fortunately I do not see any evidence of active infection locally or systemically which is great news. No fevers, chills, nausea, vomiting, or diarrhea. He does have an appointment with vascular tomorrow. 09-22-2021 upon evaluation today patient appears to be doing well with regard to his legs all things considered. I did review his note and it appears that he has significant peripheral vascular disease bilaterally in the lower extremities. Subsequently this seems to be even a femoral and iliac arteries. Subsequently I do believe that the arteriogram is probably going to be a good way to go as far as trying to improve his blood flow as noted in the record from Dr. Deitra Mayo on 09-16-2021. 09-29-2021 upon evaluation today patient appears to be doing decently well today in regard to his wounds on the legs.  Things are actually showing signs of good improvement and I am very pleased in that regard. I do not see any signs of infection at this time which is great news and in fact everything is pretty dry today. He does have his arteriogram with Dr. Doren Custard on Friday. With that being said once that is complete I really think things will be significantly improved and hopefully he should be able to not only feel better but also continue with his own compression socks. 10-06-2021 upon evaluation today patient appears to be doing well with regard to his legs in fact there is very little open at this point. Fortunately I do  not see any signs of active infection which is great news. No fevers, chills, nausea, vomiting, or diarrhea. 10-13-2021 upon evaluation today patient's legs actually appear to be doing decently well. I am really seeing not much in the way of drainage at this point. Fortunately I think that he is on the right track and I believe that we are very close to complete resolution and discharge and at this point I do think that he is tolerating the compression wraps without complication. He has his appointment with vascular coming up on the 21st of this month which is just a week and a half away. 10-20-2021 upon evaluation today patient appears to be doing well currently in regard to his legs. There is very little open or draining at this point he actually has his procedure with vascular on Friday. Hopefully he will have much better blood flow following this procedure. 7/26; patient tells me that he went for revascularization on the left leg with an apparent stent but I did not look over this and care everywhere today. He arrives in clinic today with small wounds on the right lateral right medial leg more impressive areas on the left medial and left posterior. We have been using silver alginate and kerlix Coban. 11-03-2021 patient appears to be doing much better in regard to his legs at this point. He has been revascularized and seems to be doing excellent as far as that is concerned. Fortunately I see no evidence of active infection at this time which is great news. No fevers, chills, nausea, vomiting, or diarrhea. 11-10-2021 upon evaluation today patient actually appears to be showing signs of improvement in fact there is almost nothing open on either leg. I do believe that this is something that could do quite well as far as a Velcro compression wrap is concerned. He is definitely doing better than where we were previous which is great news and overall I am extremely pleased in that regard. Nonetheless I do  believe that in general he is getting need something to wear long-term for compression and I think juxta lite compression wraps will be the way to go. 11-17-2021 upon evaluation today patient appears to be doing well with regard to his wounds. His legs are both appear to be completely healed. He does not his compression wraps with him today. For that reason I am probably going to hold off on discharging him at this point although we will probably do so in the next week. He does have his Velcro compression wraps he is going to bring with him next week as well. Electronic Signature(s) Signed: 11/17/2021 2:39:15 PM By: [name] PA-C Entered By: [name] on 11/17/2021 14:39:15 -------------------------------------------------------------------------------- Physical Exam Details Patient Name: Date of Service: [name], [name] 11/17/2021 1:45 PM Medical Record Number: [id number] Patient Account Number: [id number] Date  of Birth/Sex: Treating RN: 12-May-1960 (61 y.o. M) Primary Care Provider: [name] Other Clinician: Referring Provider: Treating Provider/Extender: [name] in Treatment: 22 Constitutional Well-nourished and well-hydrated in no acute distress. Respiratory normal breathing without difficulty. Psychiatric this patient is able to make decisions and demonstrates good insight into disease process. Alert and Oriented x 3. pleasant and cooperative. Notes Upon inspection patient's wound bed actually showed signs of good granulation and epithelization at this point. Fortunately I do not see any evidence of infection locally or systemically which is great news and overall I am extremely pleased with where we stand at this point. Electronic Signature(s) Signed: 11/17/2021 2:39:33 PM By: [name] PA-C Entered By: [name] on 11/17/2021 14:39:33 -------------------------------------------------------------------------------- Physician  Orders Details Patient Name: Date of Service: [name], [name] 11/17/2021 1:45 PM Medical Record Number: [id number] Patient Account Number: [id number] Date of Birth/Sex: Treating RN: 1961-04-03 (62 y.o. [name], [name] Primary Care Provider: [name] Other Clinician: Referring Provider: Treating Provider/Extender: [name] in Treatment: 12 Verbal / Phone Orders: No Diagnosis Coding ICD-10 Coding Code Description I89.0 Lymphedema, not elsewhere classified I87.333 Chronic venous hypertension (idiopathic) with ulcer and inflammation of bilateral lower extremity L97.822 Non-pressure chronic ulcer of other part of left lower leg with fat layer exposed L97.812 Non-pressure chronic ulcer of other part of right lower leg with fat layer exposed N18.30 Chronic kidney disease, stage 3 unspecified I50.42 Chronic combined systolic (congestive) and diastolic (congestive) heart failure I10 Essential (primary) hypertension 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 - 11/24/21 @ 0915 w/ Jeri Cos, PA and [facility] Rm # 9 Return A Other: - Arteriography 10/22/21 w/ Dr. Scot Dock Bathing/ Shower/ Hygiene May shower with protection but do not get wound dressing(s) wet. Edema Control - Lymphedema / SCD / Other Elevate legs to the level of the heart or above for 30 minutes daily and/or when sitting, a frequency of: Avoid standing for long periods of time. Compression stocking or Garment 30-40 mm/Hg pressure to: - We are ordering juxtalites today for you. Wound Treatment Wound #5 - Lower Leg Wound Laterality: Right, Circumferential Cleanser: Soap and Water 2 x Per Week/7 Days Discharge Instructions: May shower and wash wound with dial antibacterial soap and water prior to dressing  change. Peri-Wound Care: Triamcinolone 15 (g) 2 x Per Week/7 Days Discharge Instructions: Use triamcinolone 15 (g) as directed Peri-Wound Care: Sween Lotion (Moisturizing lotion) 2 x Per Week/7 Days Discharge Instructions: Apply moisturizing lotion as directed Prim Dressing: Maxorb Extra Calcium Alginate Dressing, 4x4 in 2 x Per Week/7 Days ary Discharge Instructions: Apply calcium alginate to wound bed as instructed Secondary Dressing: ABD Pad, 5x9 2 x Per Week/7 Days Discharge Instructions: Apply over primary dressing as directed. Compression Wrap: ThreePress (3 layer compression wrap) 2 x Per Week/7 Days Discharge Instructions: Apply three layer compression as directed. Compression Stockings: Circaid Juxta Lite Compression Wrap Right Leg Compression Amount: 30-40 mmHG Discharge Instructions: Apply Circaid Juxta Lite Compression Wrap daily as instructed. Apply first thing in the morning, remove at night before bed. Wound #6 - Lower Leg Wound Laterality: Left, Circumferential Cleanser: Soap and Water 2 x Per Week/7 Days Discharge Instructions: May shower and wash wound with dial antibacterial soap and water prior to dressing change. Peri-Wound Care: Triamcinolone 15 (g) 2 x Per Week/7 Days  Discharge Instructions: Use triamcinolone 15 (g) as directed Peri-Wound Care: Sween Lotion (Moisturizing lotion) 2 x Per Week/7 Days Discharge Instructions: Apply moisturizing lotion as directed Prim Dressing: Maxorb Extra Calcium Alginate Dressing, 4x4 in 2 x Per Week/7 Days ary Discharge Instructions: Apply calcium alginate to wound bed as instructed Secondary Dressing: ABD Pad, 5x9 2 x Per Week/7 Days Discharge Instructions: Apply over primary dressing as directed. Compression Wrap: ThreePress (3 layer compression wrap) 2 x Per Week/7 Days Discharge Instructions: Apply three layer compression as directed. Compression Stockings: Circaid Juxta Lite Compression Wrap Left Leg Compression Amount:  30-40 mmHG Discharge Instructions: Apply Circaid Juxta Lite Compression Wrap daily as instructed. Apply first thing in the morning, remove at night before bed. Electronic Signature(s) Signed: 11/17/2021 4:35:53 PM By: Lenda Kelp PA-C Signed: 11/23/2021 3:41:21 PM By: Fonnie Mu RN Entered By: Fonnie Mu on 11/17/2021 14:35:59 -------------------------------------------------------------------------------- Problem List Details Patient Name: Date of Service: [name], [name] 11/17/2021 1:45 PM Medical Record Number: [id number] Patient Account Number: [id number] Date of Birth/Sex: Treating RN: [birth year] (61 y.o. M) Primary Care Provider: [name] Other Clinician: Referring Provider: Treating Provider/Extender: [name] in Treatment: 12 Active Problems ICD-10 Encounter Code Description Active Date MDM Diagnosis I89.0 Lymphedema, not elsewhere classified 08/25/2021 No Yes I87.333 Chronic venous hypertension (idiopathic) with ulcer and inflammation of 08/25/2021 No Yes bilateral lower extremity L97.822 Non-pressure chronic ulcer of other part of left lower leg with fat layer exposed5/24/2023 No Yes L97.812 Non-pressure chronic ulcer of other part of right lower leg with fat layer 08/25/2021 No Yes exposed N18.30 Chronic kidney disease, stage 3 unspecified 08/25/2021 No Yes I50.42 Chronic combined systolic (congestive) and diastolic (congestive) heart failure 08/25/2021 No Yes I10 Essential (primary) hypertension 08/25/2021 No Yes 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o. M) Primary Care Provider: [name] Other Clinician: Referring Provider: Treating Provider/Extender: [name] in Treatment: 12 Subjective Chief Complaint Information obtained from Patient Bilateral Leg Ulcers History of Present Illness (HPI) 02/24/2020 on evaluation today patient presents with obvious chronic lymphedema of the bilateral lower extremities. Fortunately there is no signs of active infection at this time which is great news. With that being said the patient does have a recent treatment with doxycycline 100 mg for 10 days which he has completed. He does have a history of chronic kidney disease stage III as documented in his chart. He also has chronic venous insufficiency with lower extremity lymphedema noted. He has congestive heart failure and hypertension. He tells me currently that he was unaware of the kidney disease before he mentioned this today. He also tells me that his primary care provider had told him to quit taking showers based on what was going on with his legs at this time. Fortunately there is no signs of active infection at this time which is great news. No fevers, chills, nausea, vomiting, or diarrhea. 03/04/2020 on evaluation today patient actually appears to be doing much better in regard to his legs. There is really not much  draining a lot of the dry skin is starting to loosen up amount of time to get as much of this off today as possible 03/11/2020 upon evaluation today patient appears to be doing well currently in regard to his legs. He still has a lot of dry skin some which I was able to get off today but overall I think that the  biggest thing he needs right now is to be able to wash his legs in the shower to loosen some of this up which I think will then come off much more effectively and quickly without as much of an issue. Fortunately there is no evidence of active infection at this time. No fevers, chills, nausea, vomiting, or diarrhea. Readmission: 06/05/2020 upon evaluation today patient appears to be doing worse in regard to his bilateral lower extremities. Unfortunately he has had some issues here with his leg started to swell again he tells me he was not able to wear the compression stockings that he got from elastic therapy. He states when he called the leg he said that the measurements did not seem right and subsequently just sent him something she thought would work. Either way he tells me they were very tight and very uncomfortable he just was not able to stick with it. Fortunately there is no signs of active infection at this time. No fevers, chills, nausea, vomiting, or diarrhea. 06/12/2020 on evaluation today patient actually appears to be doing well in regard to his legs. He has a lot of dry skin but nothing that appears to be open at this point. He did not get the Velcro compression wraps but does have the pull up compression stockings which if he wears those can definitely be sufficient here. Readmission: 08-25-2021 upon evaluation today patient appears to be doing well with regard to his legs all things considered. With that being said I do not see anything that actively looks like it is open or draining but I definitely see spots where he had blistering and obvious signs of drainage. Subsequently he is supposed to be having open heart surgery to replace a valve that sounds like but they are not able to do that simply due to the fact that right now he is having more issues currently with his legs and they will not do anything until that is resolved. Fortunately I do not see any evidence of active infection  locally or systemically which is great news. No fevers, chills, nausea, vomiting, or diarrhea. The patient does have a history noted previously of lymphedema, chronic venous hypertension, chronic kidney disease stage III, congestive heart failure, and hypertension. 09-01-2021 upon evaluation today patient actually appears to be healed I do not see anything open at this time which is great news. Fortunately he is doing excellent and I think they were on the right track. With that being said there were a couple areas where he was so dry that the wrap actually stuck to his leg and this has caused some issues here with a couple small openings remaining but I think by next week these will be sealed up. Overall I think that he needs some lotion twice a day and Ace wraps are probably to be the best way to go to help with compression for now. He does have the vascular appointment upcoming in a couple of days. 6/7; patient comes in today with quite a deterioration from last week. Coupled with this is that he had his arterial studies which were really not very  good on the right his ABI was 0.92 and on the left 1.09 but TBI was 0.52 on the right and 0.36 on the left he had monophasic waveforms almost diffusely on the right with an estimated 50 to 74% stenosis of the distal SFA on the left he had similar findings. 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 We are working on getting him a vascular evaluation./Consultation Also looking through his chart he has a lot of other medical problems including severe aortic stenosis and he has been referred to cardiac surgery. He has atrial fibrillation on Eliquis 09-15-2021 upon evaluation today patient appears to be doing better than it sounds like he was last week. Fortunately I do not see any evidence of  active infection locally or systemically which is great news. No fevers, chills, nausea, vomiting, or diarrhea. He does have an appointment with vascular tomorrow. 09-22-2021 upon evaluation today patient appears to be doing well with regard to his legs all things considered. I did review his note and it appears that he has significant peripheral vascular disease bilaterally in the lower extremities. Subsequently this seems to be even a femoral and iliac arteries. Subsequently I do believe that the arteriogram is probably going to be a good way to go as far as trying to improve his blood flow as noted in the record from Dr. Waverly Ferrari on 09-16-2021. 09-29-2021 upon evaluation today patient appears to be doing decently well today in regard to his wounds on the legs. Things are actually showing signs of good improvement and I am very pleased in that regard. I do not see any signs of infection at this time which is great news and in fact everything is pretty dry today. He does have his arteriogram with Dr. Durwin Nora on Friday. With that being said once that is complete I really think things will be significantly improved and hopefully he should be able to not only feel better but also continue with his own compression socks. 10-06-2021 upon evaluation today patient appears to be doing well with regard to his legs in fact there is very little open at this point. Fortunately I do not see any signs of active infection which is great news. No fevers, chills, nausea, vomiting, or diarrhea. 10-13-2021 upon evaluation today patient's legs actually appear to be doing decently well. I am really seeing not much in the way of drainage at this point. Fortunately I think that he is on the right track and I believe that we are very close to complete resolution and discharge and at this point I do think that he is tolerating the compression wraps without complication. He has his appointment with vascular coming up on the  21st of this month which is just a week and a half away. 10-20-2021 upon evaluation today patient appears to be doing well currently in regard to his legs. There is very little open or draining at this point he actually has his procedure with vascular on Friday. Hopefully he will have much better blood flow following this procedure. 7/26; patient tells me that he went for revascularization on the left leg with an apparent stent but I did not look over this and care everywhere today. He arrives in clinic today with small wounds on the right lateral right medial leg more impressive areas on the left medial and left posterior.  We have been using silver alginate and kerlix Coban. 11-03-2021 patient appears to be doing much better in regard to his legs at this point. He has been revascularized and seems to be doing excellent as far as that is concerned. Fortunately I see no evidence of active infection at this time which is great news. No fevers, chills, nausea, vomiting, or diarrhea. 11-10-2021 upon evaluation today patient actually appears to be showing signs of improvement in fact there is almost nothing open on either leg. I do believe that this is something that could do quite well as far as a Velcro compression wrap is concerned. He is definitely doing better than where we were previous which is great news and overall I am extremely pleased in that regard. Nonetheless I do believe that in general he is getting need something to wear long-term for compression and I think juxta lite compression wraps will be the way to go. 11-17-2021 upon evaluation today patient appears to be doing well with regard to his wounds. His legs are both appear to be completely healed. He does not his compression wraps with him today. For that reason I am probably going to hold off on discharging him at this point although we will probably do so in the next week. He does have his Velcro compression wraps he is going to bring with  him next week as well. Objective Constitutional Well-nourished and well-hydrated in no acute distress. Vitals Time Taken: 1:50 PM, Height: 69 in, Weight: 215 lbs, BMI: 31.7, Temperature: 98.5 F, Pulse: 85 bpm, Respiratory Rate: 18 breaths/min, Blood Pressure: 112/80 mmHg. Respiratory normal breathing without difficulty. Psychiatric this patient is able to make decisions and demonstrates good insight into disease process. Alert and Oriented x 3. pleasant and cooperative. General Notes: Upon inspection patient's wound bed actually showed signs of good granulation and epithelization at this point. Fortunately I do not see any evidence of infection locally or systemically which is great news and overall I am extremely pleased with where we stand at this point. Integumentary (Hair, Skin) Wound #5 status is Open. Original cause of wound was Gradually Appeared. The date acquired was: 07/03/2021. The wound has been in treatment 12 weeks. The wound is located on the Right,Circumferential Lower Leg. The wound measures 0.1cm length x 0.1cm width x 0.1cm depth; 0.008cm^2 area and 0.001cm^3 volume. There is no tunneling or undermining noted. There is a medium amount of serosanguineous drainage noted. The wound margin is distinct with the outline attached to the wound base. There is large (67-100%) red, pink granulation within the wound bed. There is no necrotic tissue within the wound bed. Wound #6 status is Open. Original cause of wound was Gradually Appeared. The date acquired was: 07/03/2021. The wound has been in treatment 12 weeks. The wound is located on the Left,Circumferential Lower Leg. The wound measures 0.1cm length x 0.1cm width x 0.1cm depth; 0.008cm^2 area and 0.001cm^3 volume. There is no tunneling or undermining noted. There is a medium amount of serosanguineous drainage noted. The wound margin is distinct with the outline attached to the wound base. There is no granulation within the wound bed.  There is no necrotic tissue within the wound bed. 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 There was a Three Layer Compression Therapy Procedure by Rhae Hammock, RN. Post procedure Diagnosis Wound #5: Same as Pre-Procedure Wound #6 Pre-procedure diagnosis of Wound #6 is a Venous Leg Ulcer located on the Left,Circumferential Lower Leg . There was a Three Layer Compression Therapy Procedure by Rhae Hammock, RN. Post procedure Diagnosis Wound #6: Same as Pre-Procedure Plan Follow-up Appointments: Return Appointment in 1 week. - 11/24/21 @ 0915 w/ Jeri Cos, PA and [facility] Rm # 9 Other: - Arteriography 10/22/21 w/ Dr. Scot Dock Bathing/ Shower/ Hygiene: May shower with protection but do not get wound dressing(s) wet. Edema Control - Lymphedema / SCD / Other: Elevate legs to the level of the heart or above for 30 minutes daily and/or when sitting, a frequency of: Avoid standing for long periods of time. Compression stocking or Garment 30-40 mm/Hg pressure to: - We are ordering juxtalites today for you. WOUND #5: - Lower Leg Wound Laterality: Right, Circumferential Cleanser: Soap and Water 2 x Per Week/7 Days Discharge Instructions: May shower and wash  wound with dial antibacterial soap and water prior to dressing change. Peri-Wound Care: Triamcinolone 15 (g) 2 x Per Week/7 Days Discharge Instructions: Use triamcinolone 15 (g) as directed Peri-Wound Care: Sween Lotion (Moisturizing lotion) 2 x Per Week/7 Days Discharge Instructions: Apply moisturizing lotion as directed Prim Dressing: Maxorb Extra Calcium Alginate Dressing, 4x4 in 2 x Per Week/7 Days ary Discharge Instructions: Apply calcium alginate to wound bed as instructed Secondary Dressing: ABD Pad, 5x9 2 x Per Week/7 Days Discharge Instructions: Apply over primary dressing as directed. Com pression Wrap: ThreePress (3 layer compression wrap) 2 x Per Week/7 Days Discharge Instructions: Apply three layer compression as directed. Com pression Stockings: Circaid Juxta Lite Compression Wrap Compression Amount: 30-40 mmHg (right) Discharge Instructions: Apply Circaid Juxta Lite Compression Wrap daily as instructed. Apply first thing in the morning, remove at night before bed. WOUND #6: - Lower Leg Wound Laterality: Left, Circumferential Cleanser: Soap and Water 2 x Per Week/7 Days Discharge Instructions: May shower and wash wound with dial antibacterial soap and water prior to dressing change. Peri-Wound Care: Triamcinolone 15 (g) 2 x Per Week/7 Days Discharge Instructions: Use triamcinolone 15 (g) as directed Peri-Wound Care: Sween Lotion (Moisturizing lotion) 2 x Per Week/7 Days Discharge Instructions: Apply moisturizing lotion as directed Prim Dressing: Maxorb Extra Calcium Alginate Dressing, 4x4 in 2 x Per Week/7 Days ary Discharge Instructions: Apply calcium alginate to wound bed as instructed Secondary Dressing: ABD Pad, 5x9 2 x Per Week/7 Days Discharge Instructions: Apply over primary dressing as directed. Com pression Wrap: ThreePress (3 layer compression wrap) 2 x Per Week/7 Days Discharge Instructions: Apply three layer compression as directed. Com pression Stockings:  Circaid Juxta Lite Compression Wrap Compression Amount: 30-40 mmHg (left) Discharge Instructions: Apply Circaid Juxta Lite Compression Wrap daily as instructed. Apply first thing in the morning, remove at night before bed. 1. I do think that we should go ahead and continue with the wound care measures as before and the patient is in agreement with that plan. This includes the use of the 3 layer compression wraps bilaterally that will be for the next week. 2. He is  going to bring his Velcro compression wraps with him, juxta lites, next week and we will see where things stand at that point. We will see patient back for reevaluation in 1 week here in the clinic. If anything worsens or changes patient will contact our office for additional recommendations. I completely expect we can discharge him next week. Electronic Signature(s) Signed: 11/17/2021 2:40:04 PM By: [name] PA-C Entered By: [name] on 11/17/2021 14:40:03 -------------------------------------------------------------------------------- SuperBill Details Patient Name: Date of Service: [name], [name] 11/17/2021 Medical Record Number: [id number] Patient Account Number: [id number] Date of Birth/Sex: Treating RN: May 19, 1960 (61 y.o. 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 L97.822 Non-pressure chronic ulcer of other part of left lower leg with fat layer exposed L97.812 Non-pressure chronic ulcer of other part of right lower leg with fat layer exposed N18.30 Chronic kidney disease, stage 3 unspecified I50.42 Chronic combined systolic (congestive) and diastolic (congestive) heart failure I10  Essential (primary) hypertension 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

## 2021-11-23 ENCOUNTER — Ambulatory Visit: Payer: Medicare Other | Attending: Critical Care Medicine | Admitting: Critical Care Medicine

## 2021-11-23 ENCOUNTER — Ambulatory Visit: Payer: Medicare Other | Admitting: Critical Care Medicine

## 2021-11-23 VITALS — BP 119/77 | HR 83 | Temp 97.8°F | Resp 21 | Ht 69.0 in | Wt 204.0 lb

## 2021-11-23 DIAGNOSIS — I10 Essential (primary) hypertension: Secondary | ICD-10-CM

## 2021-11-23 DIAGNOSIS — N1832 Chronic kidney disease, stage 3b: Secondary | ICD-10-CM

## 2021-11-23 DIAGNOSIS — E612 Magnesium deficiency: Secondary | ICD-10-CM | POA: Insufficient documentation

## 2021-11-23 DIAGNOSIS — E7841 Elevated Lipoprotein(a): Secondary | ICD-10-CM | POA: Diagnosis not present

## 2021-11-23 DIAGNOSIS — G4733 Obstructive sleep apnea (adult) (pediatric): Secondary | ICD-10-CM

## 2021-11-23 DIAGNOSIS — I350 Nonrheumatic aortic (valve) stenosis: Secondary | ICD-10-CM

## 2021-11-23 DIAGNOSIS — I739 Peripheral vascular disease, unspecified: Secondary | ICD-10-CM

## 2021-11-23 NOTE — Assessment & Plan Note (Addendum)
Markedly improved control continue current medications  Spironolactone 50mg  daily Olmesartan 40mg  daily Furosmide 40mg  TID Carvedilol 25mg  BID Hydralazine 100mg  TID

## 2021-11-23 NOTE — Assessment & Plan Note (Signed)
Continue current statin therapy 

## 2021-11-23 NOTE — Assessment & Plan Note (Signed)
Has CPAP titration study upcoming in September

## 2021-11-23 NOTE — Progress Notes (Signed)
[name], [name] ([id number]) Visit Report for 11/17/2021 Arrival Information Details Patient Name: Date of Service: [name], [name] 11/17/2021 1:45 PM Medical Record Number: [id number] Patient Account Number: [id number] Date of Birth/Sex: Treating RN: 1960/11/28 (61 y.o. M) Primary Care [name]: [name] Other Clinician: Referring Samora Jernberg: Treating Zein Helbing/Extender: [name] in Treatment: 12 Visit Information History Since Last Visit Added or deleted any medications: No Patient Arrived: Ambulatory Any new allergies or adverse reactions: No Arrival Time: 13:49 Had a fall or experienced change in No Accompanied By: self activities of daily living that may affect Transfer Assistance: None risk of falls: Patient Identification Verified: Yes Signs or symptoms of abuse/neglect since last visito No Secondary Verification Process Completed: Yes Hospitalized since last visit: No Patient Requires Transmission-Based Precautions: No Implantable device outside of the clinic excluding No Patient Has Alerts: No cellular tissue based products placed in the center since last visit: Has Dressing in Place as Prescribed: Yes Has Compression in Place as Prescribed: Yes Pain Present Now: No Electronic Signature(s) Signed: 11/17/2021 4:57:18 PM By: Thayer Dallas Entered By: Thayer Dallas on 11/17/2021 13:50:41 -------------------------------------------------------------------------------- Compression Therapy Details Patient Name: Date of Service: [name], [name] 11/17/2021 1:45 PM Medical Record Number: [id number] Patient Account Number: [id number] Date of Birth/Sex: Treating RN: Aug 09, 1960 (61 y.o. [name], [name] Primary Care Merle Whitehorn: [name] Other Clinician: Referring Jamus Loving: Treating Zerline Melchior/Extender: [name] in Treatment: 12 Compression Therapy Performed for Wound Assessment: Wound #5 Right,Circumferential Lower  Leg Performed By: Clinician Fonnie Mu, RN Compression Type: Three Layer Post Procedure Diagnosis Same as Pre-procedure Electronic Signature(s) Signed: 11/23/2021 3:41:21 PM By: Fonnie Mu RN Entered By: Fonnie Mu on 11/17/2021 14:35:13 -------------------------------------------------------------------------------- Compression Therapy Details Patient Name: Date of Service: [name], [name] 11/17/2021 1:45 PM Medical Record Number: [id number] Patient Account Number: [id number] Date of Birth/Sex: Treating RN: 07/12/1960 (61 y.o. [name], [name] Primary Care Creola Krotz: [name] Other Clinician: Referring Garrus Gauthreaux: Treating Zlaty Alexa/Extender: [name] in Treatment: 12 Compression Therapy Performed for Wound Assessment: Wound #6 Left,Circumferential Lower Leg Performed By: Clinician Fonnie Mu, RN Compression Type: Three Layer Post Procedure Diagnosis Same as Pre-procedure Electronic Signature(s) Signed: 11/23/2021 3:41:21 PM By: Fonnie Mu RN Entered By: Fonnie Mu on 11/17/2021 14:35:13 -------------------------------------------------------------------------------- Encounter Discharge Information Details Patient Name: Date of Service: [name], [name] 11/17/2021 1:45 PM Medical Record Number: [id number] Patient Account Number: [id number] Date of Birth/Sex: Treating RN: Jul 22, 1960 (61 y.o. [name], [name] Primary Care Keiffer Piper: [name] Other Clinician: Referring Ralphael Southgate: Treating Chatara Lucente/Extender: [name] in Treatment: 12 Encounter Discharge Information Items Discharge Condition: Stable Ambulatory Status: Ambulatory Discharge Destination: Home Transportation: Private Auto Accompanied By: self Schedule Follow-up Appointment: Yes Clinical Summary of Care: Patient Declined Electronic Signature(s) Signed: 11/23/2021 3:41:21 PM By: Fonnie Mu  RN Entered By: Fonnie Mu on 11/17/2021 14:59:17 -------------------------------------------------------------------------------- Lower Extremity Assessment Details Patient Name: Date of Service: [name], [name] 11/17/2021 1:45 PM Medical Record Number: [id number] Patient Account Number: [id number] Date of Birth/Sex: Treating RN: [birth year] (61 y.o. M) Primary Care Audery Wassenaar: [name] Other Clinician: Referring Luverne Zerkle: Treating Keir Foland/Extender: [name] in Treatment: 12 Edema Assessment Assessed: [name]: No] [name]: No] Edema: [Left: Yes] [Right: Yes] Calf Left: Right: Point of Measurement: From Medial Instep 35.2 cm 36.4 cm Ankle Left: Right: Point of Measurement: From Medial Instep 23 cm 22 cm Electronic Signature(s) Signed: 11/17/2021 4:57:18 PM By: Thayer Dallas Entered By: Thayer Dallas on 11/17/2021 14:20:00 -------------------------------------------------------------------------------- Multi-Disciplinary  Care Plan Details Patient Name: Date of Service: [name], [name] 11/17/2021 1:45 PM Medical Record Number: [id number] Patient Account Number: [id number] Date of Birth/Sex: Treating RN: 09/15/1960 (61 y.o. [name], [name] Primary Care Modean Mccullum: [name] Other Clinician: Referring Zaiyah Sottile: Treating Viriginia Amendola/Extender: [name] in Treatment: 12 Active Inactive Wound/Skin Impairment Nursing Diagnoses: Impaired tissue integrity Knowledge deficit related to ulceration/compromised skin integrity Goals: Patient/caregiver will verbalize understanding of skin care regimen Date Initiated: 08/25/2021 Target Resolution Date: 11/25/2021 Goal Status: Active Ulcer/skin breakdown will have a volume reduction of 30% by week 4 Date Initiated: 08/25/2021 Target Resolution Date: 11/25/2021 Goal Status: Active Interventions: Assess patient/caregiver ability to obtain necessary supplies Assess  patient/caregiver ability to perform ulcer/skin care regimen upon admission and as needed Assess ulceration(s) every visit Notes: Electronic Signature(s) Signed: 11/23/2021 3:41:21 PM By: Fonnie Mu RN Entered By: Fonnie Mu on 11/17/2021 14:36:39 -------------------------------------------------------------------------------- Pain Assessment Details Patient Name: Date of Service: [name], [name] 11/17/2021 1:45 PM Medical Record Number: [id number] Patient Account Number: [id number] Date of Birth/Sex: Treating RN: October 10, 1960 (61 y.o. M) Primary Care Marquez Ceesay: Other Clinician: [name] Referring Joylynn Defrancesco: Treating Adair Lauderback/Extender: [name] in Treatment: 12 Active Problems Location of Pain Severity and Description of Pain Patient Has Paino No Site Locations Pain Management and Medication Current Pain Management: Electronic Signature(s) Signed: 11/17/2021 4:57:18 PM By: Thayer Dallas Entered By: Thayer Dallas on 11/17/2021 13:53:35 -------------------------------------------------------------------------------- Patient/Caregiver Education Details Patient Name: Date of Service: [name] 8/16/2023andnbsp1:45 PM Medical Record Number: [id number] Patient Account Number: [id number] Date of Birth/Gender: Treating RN: 1960/07/19 (61 y.o. Lucious Groves Primary Care Physician: [name] Other Clinician: Referring Physician: Treating Physician/Extender: [name] in Treatment: 12 Education Assessment Education Provided To: Patient Education Topics Provided Wound/Skin Impairment: Methods: Explain/Verbal Responses: Reinforcements needed, State content correctly [employer]) Signed: 11/23/2021 3:41:21 PM By: Fonnie Mu RN Entered By: Fonnie Mu on 11/17/2021 14:37:10 -------------------------------------------------------------------------------- Wound Assessment  Details Patient Name: Date of Service: [name], [name] 11/17/2021 1:45 PM Medical Record Number: [id number] Patient Account Number: [id number] Date of Birth/Sex: Treating RN: 12-19-1960 (61 y.o. [name], [name] Primary Care Anjeanette Petzold: [name] Other Clinician: Referring Jerine Surles: Treating Tahj Lindseth/Extender: [name] in Treatment: 12 Wound Status Wound Number: 5 Primary Etiology: Venous Leg Ulcer Wound Location: Right, Circumferential Lower Leg Wound Status: Open Wounding Event: Gradually Appeared Comorbid History: Congestive Heart Failure, Hypertension Date Acquired: 07/03/2021 Weeks Of Treatment: 12 Clustered Wound: No Photos Wound Measurements Length: (cm) 0.1 Width: (cm) 0.1 Depth: (cm) 0.1 Area: (cm) 0.008 Volume: (cm) 0.001 % Reduction in Area: 100% % Reduction in Volume: 100% Epithelialization: Large (67-100%) Tunneling: No Undermining: No Wound Description Classification: Full Thickness With Exposed Support Structures Wound Margin: Distinct, outline attached Exudate Amount: Medium Exudate Type: Serosanguineous Exudate Color: red, brown Foul Odor After Cleansing: No Slough/Fibrino No Wound Bed Granulation Amount: Large (67-100%) Exposed Structure Granulation Quality: Red, Pink Fascia Exposed: No Necrotic Amount: None Present (0%) Fat Layer (Subcutaneous Tissue) Exposed: No Tendon Exposed: No Muscle Exposed: No Joint Exposed: No Bone Exposed: No Treatment Notes Wound #5 (Lower Leg) Wound Laterality: Right, Circumferential Cleanser Soap and Water Discharge Instruction: May shower and wash wound with dial antibacterial soap and water prior to dressing change. Peri-Wound Care Triamcinolone 15 (g) Discharge Instruction: Use triamcinolone 15 (g) as directed Sween Lotion (Moisturizing lotion) Discharge Instruction: Apply moisturizing lotion as directed Topical Primary Dressing Maxorb Extra Calcium Alginate Dressing,  4x4 in Discharge Instruction:  Apply calcium alginate to wound bed as instructed Secondary Dressing ABD Pad, 5x9 Discharge Instruction: Apply over primary dressing as directed. Secured With Compression Wrap ThreePress (3 layer compression wrap) Discharge Instruction: Apply three layer compression as directed. Compression Stockings Circaid Juxta Lite Compression Wrap Quantity: 1 Right Leg Compression Amount: 30-40 mmHg Discharge Instruction: Apply Circaid Juxta Lite Compression Wrap daily as instructed. Apply first thing in the morning, remove at night before bed. Add-Ons Electronic Signature(s) Signed: 11/23/2021 3:41:21 PM By: Fonnie Mu RN Entered By: Fonnie Mu on 11/17/2021 14:34:38 -------------------------------------------------------------------------------- Wound Assessment Details Patient Name: Date of Service: [name], [name] 11/17/2021 1:45 PM Medical Record Number: [id number] Patient Account Number: [id number] Date of Birth/Sex: Treating RN: 1960/11/23 (61 y.o. [name], [name] Primary Care Pattijo Juste: [name] Other Clinician: Referring Mahasin Riviere: Treating Adeliz Tonkinson/Extender: [name] in Treatment: 12 Wound Status Wound Number: 6 Primary Etiology: Venous Leg Ulcer Wound Location: Left, Circumferential Lower Leg Wound Status: Open Wounding Event: Gradually Appeared Comorbid History: Congestive Heart Failure, Hypertension Date Acquired: 07/03/2021 Weeks Of Treatment: 12 Clustered Wound: No Photos Wound Measurements Length: (cm) 0.1 Width: (cm) 0.1 Depth: (cm) 0.1 Area: (cm) 0.008 Volume: (cm) 0.001 Wound Description Classification: Full Thickness With Exposed Support Structures Wound Margin: Distinct, outline attached Exudate Amount: Medium Exudate Type: Serosanguineous Exudate Color: red, brown Foul Odor After Cleansing: Slough/Fibrino % Reduction in Area: 100% % Reduction in Volume:  100% Epithelialization: Large (67-100%) Tunneling: No Undermining: No No No Wound Bed Granulation Amount: None Present (0%) Exposed Structure Necrotic Amount: None Present (0%) Fascia Exposed: No Fat Layer (Subcutaneous Tissue) Exposed: No Tendon Exposed: No Muscle Exposed: No Joint Exposed: No Bone Exposed: No Treatment Notes Wound #6 (Lower Leg) Wound Laterality: Left, Circumferential Cleanser Soap and Water Discharge Instruction: May shower and wash wound with dial antibacterial soap and water prior to dressing change. Peri-Wound Care Triamcinolone 15 (g) Discharge Instruction: Use triamcinolone 15 (g) as directed Sween Lotion (Moisturizing lotion) Discharge Instruction: Apply moisturizing lotion as directed Topical Primary Dressing Maxorb Extra Calcium Alginate Dressing, 4x4 in Discharge Instruction: Apply calcium alginate to wound bed as instructed Secondary Dressing ABD Pad, 5x9 Discharge Instruction: Apply over primary dressing as directed. Secured With Compression Wrap ThreePress (3 layer compression wrap) Discharge Instruction: Apply three layer compression as directed. Compression Stockings Circaid Juxta Lite Compression Wrap Quantity: 1 Left Leg Compression Amount: 30-40 mmHg Discharge Instruction: Apply Circaid Juxta Lite Compression Wrap daily as instructed. Apply first thing in the morning, remove at night before bed. Add-Ons Electronic Signature(s) Signed: 11/23/2021 3:41:21 PM By: Fonnie Mu RN Entered By: Fonnie Mu on 11/17/2021 14:34:43 -------------------------------------------------------------------------------- Vitals Details Patient Name: Date of Service: [name], [name] 11/17/2021 1:45 PM Medical Record Number: [id number] Patient Account Number: [id number] Date of Birth/Sex: Treating RN: Feb 14, 1961 (61 y.o. M) Primary Care Cinde Ebert: [name] Other Clinician: Referring Andres Vest: Treating Eryc Bodey/Extender: [name] in Treatment: 12 Vital Signs Time Taken: 13:50 Temperature (F): 98.5 Height (in): 69 Pulse (bpm): 85 Weight (lbs): 215 Respiratory Rate (breaths/min): 18 Body Mass Index (BMI): 31.7 Blood Pressure (mmHg): 112/80 Reference Range: 80 - 120 mg / dl Electronic Signature(s) Signed: 11/17/2021 4:57:18 PM By: Thayer Dallas Entered By: Thayer Dallas on 11/17/2021 13:53:08

## 2021-11-23 NOTE — Patient Instructions (Signed)
No change in medications  Labs today include metabolic panel and magnesium level we will let cardiology know results  Keep your upcoming appoint with vascular surgery and with cardiology and your sleep study in September  Return to see Dr. Delford Field 4 months for primary care

## 2021-11-23 NOTE — Assessment & Plan Note (Signed)
Assess metabolic panel and magnesium

## 2021-11-23 NOTE — Assessment & Plan Note (Signed)
Assess magnesium level

## 2021-11-23 NOTE — Assessment & Plan Note (Signed)
Management per vascular surgery. 

## 2021-11-23 NOTE — Assessment & Plan Note (Signed)
We need to determine timing of referral back to cardiothoracic surgery for aortic valve replacement  We will confer with cardiology

## 2021-11-23 NOTE — Progress Notes (Signed)
Acute Office Visit  Subjective:     Patient ID: [name], male    DOB: June 20, 1960, 61 y.o.   MRN: [id number]  Chief Complaint  Patient presents with   Hypertension    HPI This patient is seen today for evaluation of hypertension.   11/08/21  He was seen at the wound care center last week with blood pressures in the 180/130 range.  We had just seen him in June and make changes in his blood pressure medications.  He states he has been compliant with medications as prescribed.  I called him yesterday on the phone and told him to increase his hydralazine to 100 mg 3 times daily.  All other medications are the same.  Below is documentation from the last cardiology visit Aortic stenosis: Echocardiogram 03/2021 showed severe asymmetric LVH, EF 70 to 75%, severe aortic stenosis (Vmax 4 m/s, mean gradient 33 mmHg, AVA 0.9 to 1 cm).  LHC/RHC on 05/27/2021 showed 90% distal LAD stenosis, 90% D2 stenosis, otherwise nonobstructive CAD; RA 14, RV 42/12, PA 44/22/27, PCWP 19. -Previously has been asymptomatic but now reporting dyspnea with exertion.  Seen by [name], planning surgical AVR.  Was scheduled for April 2023, but cancelled as patient had developed worsening lower extremity edema and wounds on legs.  Will need wounds to heal before undergoing surgery.  Found to have bilateral PAD which is likely affecting wound healing.  Underwent angiography on 10/22/2021, and underwent angioplasty to left tibioperoneal trunk and left peroneal artery.   Chronic diastolic heart failure: Echocardiogram on 04/16/2020 showed LVEF 70 to 75%, moderate LVH, grade 2 diastolic dysfunction, moderate to severe aortic stenosis (Vmax 4.0 m/s, mean gradient 38 mmHg, AVA 1.1 cm, DI 0.32).  On Lasix 40 mg thrice daily -Continue lasix.   Will check BMET, magnesium   PAD: Lower extremity duplex 09/02/2021 showed right 50 to 74% stenosis in SFA, bilateral iliac occlusive disease, diminished flow in tibial vessels bilaterally.   Seen by Dr. Scot Dock with [facility].  Underwent angiography on 10/22/2021, and underwent angioplasty to left tibioperoneal trunk and left peroneal artery.   LVH: Severe asymmetric LVH on echo, likely due to severe AS.  Cardiac MRI on 05/03/2021 showed severe LVH measuring up to 20 mm and basal septum (16 mm and posterior wall); no evidence of amyloidosis, and while meets criteria for HCM, likely is secondary to severe aortic stenosis.  There was patchy LGE in septum and RV insertion site accounting for 3% of total myocardial mass.    Atrial fibrillation: in A. fib at prior clinic visit.  Severe biatrial enlargement on echo, may be difficult to obtain rhythm control without antiarrhythmic.  CHA2DS2-VASc score 2 (hypertension, CHF) can just do -Continue Eliquis 5 mg twice daily.  He has also been taking ASA, can discontinue since on Eliquis -Continue Coreg 25 mg twice daily.   -Seen in A-fib clinic, recommended rate control strategy for now while undergoing valve work-up.  If undergoing surgical AVR, could undergo MAZE procedure   Resistant hypertension: on carvedilol 25 mg twice daily, clonidine 0.2 mg 3 times daily, Lasix 40 mg thrice daily, hydralazine 50 mg 3 times daily, olmesartan 40 mg daily.  Work-up for secondary causes includes no evidence of renal artery stenosis on duplex.  Elevated aldosterone/renin ratio but normal aldosterone level argues against hyperaldosteronism.  Normal TSH.  Sleep study shows severe OSA, suspect this is contributing.  Started on CPAP.  Was on amlodipine but discontinued due to edema -BP elevated.  Will  check BMET, if stable plan to add spironolactone.  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  Denies any abdominal pain or unintentional weight loss.  Continue to monitor.   Hyperlipidemia: On atorvastatin 20 mg  daily.  LDL 46 on 08/23/2021   OSA: Severe OSA on sleep study, starting on CPAP.   QT prolongation: QTC 500 at prior clinic visit, most recent EKG with QTC 469, will continue to monitor     RTC in 6 weeks   The patient does have aortic stenosis but cannot have aortic valve replacement until his medical condition stabilizes the patient is had peripheral artery disease treated already   With the peripheral artery disease he has had stents placed in the left leg.  Wounds are improving from this.  Patient does note some shortness of breath with exertion.  There is no other complaints at this time.  8/22 Patient is seen in return follow-up blood pressure on arrival the best we have seen it 19/77.  Patient is feeling better wounds and lower extremity are improved.  He has less edema.  He just saw the cardiology pharmacist recently and below is documentation from that visit.  [name] has been added.  Patient also needs repeat labs at this visit including magnesium  Saw [facility] one day after seen by PCP on 8/8: 1. CHF -  BP in room today 126/79 which is at goal of <130/80.  Due for labs but will postpone for 1 week since he had not decreased his magnesium yet. Will also be able to assess if he can tolerate spironolactone.     Will add Farxiga at this time for HFpEF benefit and continue other HTN medications. Has follow up with Dr Gardiner Rhyme in 1 month.   Start Farxiga 21m daily Continue: Spironolactone 529mdaily Olmesartan 4055maily Furosmide 58m80mD Carvedilol 25mg76m Hydralazine 100mg 21mCheck BMP in 1 week Recheck with Dr SchumaGardiner Rhymeweeks   Chris Karren CobblemD, BCACP, CDCES,[place] Patient has follow-up visit with vascular surgery this week then sees cardiology again in 3 weeks he has a CPAP titration study middle of September   Review of Systems  Constitutional:  Negative for chills, diaphoresis, fever, malaise/fatigue and weight loss.  HENT:  Negative for  congestion, hearing loss, nosebleeds, sore throat and tinnitus.   Eyes:  Negative for blurred vision, photophobia and redness.  Respiratory:  Negative for cough, hemoptysis, sputum production, shortness of breath, wheezing and stridor.   Cardiovascular:  Negative for chest pain, palpitations, orthopnea, claudication, leg swelling and PND.  Gastrointestinal:  Negative for abdominal pain, blood in stool, constipation, diarrhea, heartburn, nausea and vomiting.  Genitourinary:  Negative for dysuria, flank pain, frequency, hematuria and urgency.  Musculoskeletal:  Negative for back pain, falls, joint pain, myalgias and neck pain.  Skin:  Negative for itching and rash.  Neurological:  Negative for dizziness, tingling, tremors, sensory change, speech change, focal weakness, seizures, loss of consciousness, weakness and headaches.  Endo/Heme/Allergies:  Negative for environmental allergies and polydipsia. Does not bruise/bleed easily.  Psychiatric/Behavioral:  Negative for depression, memory loss, substance abuse and suicidal ideas. The patient is not nervous/anxious and does not have insomnia.         Objective:    BP 119/77 (BP Location: Left Arm, Patient Position: Sitting, Cuff Size: Large)  Pulse 83   Temp 97.8 F (36.6 C)   Resp (!) 21   Ht '5\' 9"'  (1.753 m)   Wt 204 lb (92.5 kg)   SpO2 96%   BMI 30.13 kg/m    Physical Exam Vitals reviewed.  Constitutional:      Appearance: Normal appearance. He is well-developed. He is not diaphoretic.  HENT:     Head: Normocephalic and atraumatic.     Nose: No nasal deformity, septal deviation, mucosal edema or rhinorrhea.     Right Sinus: No maxillary sinus tenderness or frontal sinus tenderness.     Left Sinus: No maxillary sinus tenderness or frontal sinus tenderness.     Mouth/Throat:     Pharynx: No oropharyngeal exudate.  Eyes:     General: No scleral icterus.    Conjunctiva/sclera: Conjunctivae normal.     Pupils: Pupils are equal,  round, and reactive to light.  Neck:     Thyroid: No thyromegaly.     Vascular: No carotid bruit or JVD.     Trachea: Trachea normal. No tracheal tenderness or tracheal deviation.  Cardiovascular:     Rate and Rhythm: Normal rate and regular rhythm.     Chest Wall: PMI is not displaced.     Pulses: Normal pulses. No decreased pulses.     Heart sounds: Normal heart sounds, S1 normal and S2 normal. Heart sounds not distant. No murmur heard.    No systolic murmur is present.     No diastolic murmur is present.     No friction rub. No gallop. No S3 or S4 sounds.  Pulmonary:     Effort: No tachypnea, accessory muscle usage or respiratory distress.     Breath sounds: No stridor. No decreased breath sounds, wheezing, rhonchi or rales.  Chest:     Chest wall: No tenderness.  Abdominal:     General: Bowel sounds are normal. There is no distension.     Palpations: Abdomen is soft. Abdomen is not rigid.     Tenderness: There is no abdominal tenderness. There is no guarding or rebound.  Musculoskeletal:        General: Normal range of motion.     Cervical back: Normal range of motion and neck supple. No edema, erythema or rigidity. No muscular tenderness. Normal range of motion.  Lymphadenopathy:     Head:     Right side of head: No submental or submandibular adenopathy.     Left side of head: No submental or submandibular adenopathy.     Cervical: No cervical adenopathy.  Skin:    General: Skin is warm and dry.     Coloration: Skin is not pale.     Findings: No rash.     Nails: There is no clubbing.  Neurological:     Mental Status: He is alert and oriented to person, place, and time.     Sensory: No sensory deficit.  Psychiatric:        Speech: Speech normal.        Behavior: Behavior normal.     No results found for any visits on 11/23/21.      Assessment & Plan:   Problem List Items Addressed This Visit       Cardiovascular and Mediastinum   HTN (hypertension) - Primary  (Chronic)    Markedly improved control continue current medications  Spironolactone 53m daily Olmesartan 461mdaily Furosmide 4031mID Carvedilol 73m78mD Hydralazine 100mg65m      Relevant Orders  BMP8+eGFR   Severe aortic stenosis    We need to determine timing of referral back to cardiothoracic surgery for aortic valve replacement  We will confer with cardiology      Peripheral artery disease ([place])    Management per vascular surgery        Respiratory   OSA (obstructive sleep apnea)    Has CPAP titration study upcoming in September        Genitourinary   Stage 3b chronic kidney disease ([place])    Assess metabolic panel and magnesium      Relevant Orders   BMP8+eGFR     Other   Hyperlipemia    Continue current statin therapy      Magnesium deficiency    Assess magnesium level      Relevant Orders   Magnesium   No orders of the defined types were placed in this encounter.  Return in about 4 months (around 03/25/2022) for htn, chronic conditions.  [name], MD

## 2021-11-24 ENCOUNTER — Encounter (HOSPITAL_BASED_OUTPATIENT_CLINIC_OR_DEPARTMENT_OTHER): Payer: Medicare Other | Admitting: Physician Assistant

## 2021-11-24 ENCOUNTER — Telehealth: Payer: Self-pay

## 2021-11-24 DIAGNOSIS — L97822 Non-pressure chronic ulcer of other part of left lower leg with fat layer exposed: Secondary | ICD-10-CM | POA: Diagnosis not present

## 2021-11-24 DIAGNOSIS — I872 Venous insufficiency (chronic) (peripheral): Secondary | ICD-10-CM | POA: Diagnosis not present

## 2021-11-24 DIAGNOSIS — L97812 Non-pressure chronic ulcer of other part of right lower leg with fat layer exposed: Secondary | ICD-10-CM | POA: Diagnosis not present

## 2021-11-24 DIAGNOSIS — I890 Lymphedema, not elsewhere classified: Secondary | ICD-10-CM | POA: Diagnosis not present

## 2021-11-24 DIAGNOSIS — I87333 Chronic venous hypertension (idiopathic) with ulcer and inflammation of bilateral lower extremity: Secondary | ICD-10-CM | POA: Diagnosis not present

## 2021-11-24 DIAGNOSIS — N183 Chronic kidney disease, stage 3 unspecified: Secondary | ICD-10-CM | POA: Diagnosis not present

## 2021-11-24 DIAGNOSIS — I70248 Atherosclerosis of native arteries of left leg with ulceration of other part of lower left leg: Secondary | ICD-10-CM | POA: Diagnosis not present

## 2021-11-24 DIAGNOSIS — I130 Hypertensive heart and chronic kidney disease with heart failure and stage 1 through stage 4 chronic kidney disease, or unspecified chronic kidney disease: Secondary | ICD-10-CM | POA: Diagnosis not present

## 2021-11-24 DIAGNOSIS — I509 Heart failure, unspecified: Secondary | ICD-10-CM | POA: Diagnosis not present

## 2021-11-24 DIAGNOSIS — I5042 Chronic combined systolic (congestive) and diastolic (congestive) heart failure: Secondary | ICD-10-CM | POA: Diagnosis not present

## 2021-11-24 DIAGNOSIS — I350 Nonrheumatic aortic (valve) stenosis: Secondary | ICD-10-CM | POA: Diagnosis not present

## 2021-11-24 DIAGNOSIS — Z7901 Long term (current) use of anticoagulants: Secondary | ICD-10-CM | POA: Diagnosis not present

## 2021-11-24 LAB — BMP8+EGFR
BUN/Creatinine Ratio: 18 (ref 10–24)
BUN: 31 mg/dL — ABNORMAL HIGH (ref 8–27)
CO2: 22 mmol/L (ref 20–29)
Calcium: 9.4 mg/dL (ref 8.6–10.2)
Chloride: 102 mmol/L (ref 96–106)
Creatinine, Ser: 1.73 mg/dL — ABNORMAL HIGH (ref 0.76–1.27)
Glucose: 156 mg/dL — ABNORMAL HIGH (ref 70–99)
Potassium: 3.5 mmol/L (ref 3.5–5.2)
Sodium: 138 mmol/L (ref 134–144)
eGFR: 44 mL/min/{1.73_m2} — ABNORMAL LOW (ref >59)

## 2021-11-24 LAB — MAGNESIUM: Magnesium: 2.3 mg/dL (ref 1.6–2.3)

## 2021-11-24 NOTE — Progress Notes (Signed)
GURSHAN, SETTLEMIRE ([id number]) Visit Report for 11/24/2021 Chief Complaint Document Details Patient Name: Date of Service: [name], [name] 11/24/2021 9:15 A M Medical Record Number: [id number] Patient Account Number: [id number] Date of Birth/Sex: Treating RN: [birth year] (61 y.o. [name], [name] Primary Care Provider: [name] Other Clinician: Referring Provider: Treating Provider/Extender: [name] in Treatment: 13 Information Obtained from: Patient Chief Complaint Bilateral Leg Ulcers Electronic Signature(s) Signed: 11/24/2021 9:16:42 AM By: Lenda Kelp PA-C Previous Signature: 11/24/2021 9:16:25 AM Version By: Lenda Kelp PA-C Entered By: Lenda Kelp on 11/24/2021 09:16:42 -------------------------------------------------------------------------------- Problem List Details Patient Name: Date of Service: [name], [name] 11/24/2021 9:15 A M Medical Record Number: [id number] Patient Account Number: [id number] Date of Birth/Sex: Treating RN: May 29, 1960 (61 y.o. 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 of 08/25/2021 No Yes bilateral lower extremity L97.822 Non-pressure chronic ulcer of other part of left lower leg with fat layer exposed5/24/2023 No Yes L97.812 Non-pressure chronic ulcer of other part of right lower leg with fat layer 08/25/2021 No Yes exposed N18.30 Chronic kidney disease, stage 3 unspecified 08/25/2021 No Yes I50.42 Chronic combined systolic (congestive) and diastolic (congestive) heart failure 08/25/2021 No Yes I10 Essential (primary) hypertension 08/25/2021 No Yes I70.201 Unspecified  atherosclerosis of native arteries of extremities, right leg 09/08/2021 No Yes I70.248 Atherosclerosis of native arteries of left leg with ulceration of other part of 09/08/2021 No Yes lower leg Inactive Problems Resolved Problems Electronic Signature(s) Signed: 11/24/2021 9:16:09 AM By: Lenda Kelp PA-C Entered By: Lenda Kelp on 11/24/2021 09:16:09

## 2021-11-24 NOTE — Telephone Encounter (Signed)
Pt given lab results per notes of Dr. Delford Field on 11/24/21. Pt verbalized understanding.

## 2021-11-24 NOTE — Progress Notes (Signed)
Let pt know kidney is stable, potassium normal, magnesium is normal no medication changes, copied result to dr Bjorn Pippin

## 2021-11-25 ENCOUNTER — Encounter: Payer: Self-pay | Admitting: Vascular Surgery

## 2021-11-25 ENCOUNTER — Ambulatory Visit (INDEPENDENT_AMBULATORY_CARE_PROVIDER_SITE_OTHER): Payer: Medicare Other | Admitting: Vascular Surgery

## 2021-11-25 ENCOUNTER — Ambulatory Visit (HOSPITAL_COMMUNITY)
Admission: RE | Admit: 2021-11-25 | Discharge: 2021-11-25 | Disposition: A | Discharge status: Home / Self Care | Payer: Medicare Other | Source: Ambulatory Visit | Attending: Vascular Surgery | Admitting: Vascular Surgery

## 2021-11-25 ENCOUNTER — Telehealth: Payer: Self-pay

## 2021-11-25 VITALS — BP 127/81 | HR 75 | Temp 97.9°F | Resp 20 | Ht 69.0 in | Wt 203.9 lb

## 2021-11-25 DIAGNOSIS — I872 Venous insufficiency (chronic) (peripheral): Secondary | ICD-10-CM

## 2021-11-25 DIAGNOSIS — I70299 Other atherosclerosis of native arteries of extremities, unspecified extremity: Secondary | ICD-10-CM | POA: Diagnosis not present

## 2021-11-25 DIAGNOSIS — L97909 Non-pressure chronic ulcer of unspecified part of unspecified lower leg with unspecified severity: Secondary | ICD-10-CM | POA: Diagnosis not present

## 2021-11-25 NOTE — Progress Notes (Signed)
[name] ([id number]) Visit Report for 11/24/2021 Arrival Information Details Patient Name: Date of Service: [name], [name] 11/24/2021 9:15 A M Medical Record Number: [id number] Patient Account Number: [id number] Date of Birth/Sex: Treating RN: [birth year] (61 y.o. [name] Primary Care [name]: [name] Other Clinician: Referring [name]: Treating [name]/Extender: [name] in Treatment: 13 Visit Information History Since Last Visit Added or deleted any medications: No Patient Arrived: Ambulatory Any new allergies or adverse reactions: No Arrival Time: 09:36 Had a fall or experienced change in No Accompanied By: self activities of daily living that may affect Transfer Assistance: None risk of falls: Patient Identification Verified: Yes Signs or symptoms of abuse/neglect since last visito No Secondary Verification Process Completed: Yes Hospitalized since last visit: No Patient Requires Transmission-Based Precautions: No Implantable device outside of the clinic excluding No Patient Has Alerts: No cellular tissue based products placed in the center since last visit: Has Compression in Place as Prescribed: Yes Pain Present Now: No Electronic Signature(s) Signed: 11/25/2021 10:35:58 AM By: [name] Entered By: [name] on 11/24/2021 09:37:28 -------------------------------------------------------------------------------- Clinic Level of Care Assessment Details Patient Name: Date of Service: [name], [name] 11/24/2021 9:15 A M Medical Record Number: [id number] Patient Account Number: [id number] Date of Birth/Sex: Treating RN: 1960-06-07 (61 y.o. [name] Primary Care [name]: [name] Other Clinician: Referring [name]: Treating [name]/Extender: [name] in Treatment: 13 Clinic Level of Care Assessment Items TOOL 4 Quantity Score X- 1 0 Use when only an EandM is  performed on FOLLOW-UP visit ASSESSMENTS - Nursing Assessment / Reassessment X- 1 10 Reassessment of Co-morbidities (includes updates in patient status) X- 1 5 Reassessment of Adherence to Treatment Plan ASSESSMENTS - Wound and Skin A ssessment / Reassessment X - Simple Wound Assessment / Reassessment - one wound 1 5 []  - 0 Complex Wound Assessment / Reassessment - multiple wounds []  - 0 Dermatologic / Skin Assessment (not related to wound area) ASSESSMENTS - Focused Assessment X- 2 5 Circumferential Edema Measurements - multi extremities []  - 0 Nutritional Assessment / Counseling / Intervention []  - 0 Lower Extremity Assessment (monofilament, tuning fork, pulses) []  - 0 Peripheral Arterial Disease Assessment (using hand held doppler) ASSESSMENTS - Ostomy and/or Continence Assessment and Care []  - 0 Incontinence Assessment and Management []  - 0 Ostomy Care Assessment and Management (repouching, etc.) PROCESS - Coordination of Care X - Simple Patient / Family Education for ongoing care 1 15 []  - 0 Complex (extensive) Patient / Family Education for ongoing care X- 1 10 Staff obtains , Records, T Results / Process Orders est []  - 0 Staff telephones HHA, Nursing Homes / Clarify orders / etc []  - 0 Routine Transfer to another Facility (non-emergent condition) []  - 0 Routine Hospital Admission (non-emergent condition) []  - 0 New Admissions / / Ordering NPWT Apligraf, etc. , []  - 0 Emergency Hospital Admission (emergent condition) X- 1 10 Simple Discharge Coordination []  - 0 Complex (extensive) Discharge Coordination PROCESS - Special Needs []  - 0 Pediatric / Minor Patient Management []  - 0 Isolation Patient Management []  - 0 Hearing / Language / Visual special needs []  - 0 Assessment of Community assistance (transportation, D/C planning, etc.) []  - 0 Additional assistance / Altered mentation []  - 0 Support Surface(s) Assessment  (bed, cushion, seat, etc.) INTERVENTIONS - Wound Cleansing / Measurement []  - 0 Simple Wound Cleansing - one wound X- 2 5 Complex Wound Cleansing - multiple wounds X- 1  5 Wound Imaging (photographs - any number of wounds) []  - 0 Wound Tracing (instead of photographs) []  - 0 Simple Wound Measurement - one wound X- 2 5 Complex Wound Measurement - multiple wounds INTERVENTIONS - Wound Dressings []  - 0 Small Wound Dressing one or multiple wounds []  - 0 Medium Wound Dressing one or multiple wounds []  - 0 Large Wound Dressing one or multiple wounds []  - 0 Application of Medications - topical []  - 0 Application of Medications - injection INTERVENTIONS - Miscellaneous []  - 0 External ear exam []  - 0 Specimen Collection (cultures, biopsies, blood, body fluids, etc.) []  - 0 Specimen(s) / Culture(s) sent or taken to Lab for analysis []  - 0 Patient Transfer (multiple staff / / Similar devices) []  - 0 Simple Staple / Suture removal (25 or less) []  - 0 Complex Staple / Suture removal (26 or more) []  - 0 Hypo / Hyperglycemic Management (close monitor of Blood Glucose) []  - 0 Ankle / Brachial Index (ABI) - do not check if billed separately X- 1 5 Vital Signs Has the patient been seen at the hospital within the last three years: Yes Total Score: 95 Level Of Care: New/Established - Level 3 Electronic Signature(s) Signed: 11/25/2021 3:58:21 PM By: RN Entered By: on 11/24/2021 10:13:46 -------------------------------------------------------------------------------- Encounter Discharge Information Details Patient Name: Date of Service: [name] 11/24/2021 9:15 A M Medical Record Number: Patient Account Number: Date of Birth/Sex: Treating RN: May 30, 1960 (61 y.o. Nurse, adult, Small Primary Care [name]: Other Clinician: Referring Jary Louvier: Treating Ravin Bendall/Extender: in Treatment: 13 Encounter Discharge Information Items Discharge Condition: Stable Ambulatory Status: Ambulatory Discharge Destination: Home Transportation: Private Auto Accompanied By: self Schedule Follow-up Appointment: Yes Clinical Summary of Care: Patient Declined Electronic Signature(s) Signed: 11/25/2021 3:58:21 PM By: RN Entered By: 11/27/2021 on 11/24/2021 10:14:15 -------------------------------------------------------------------------------- Lower Extremity Assessment Details Patient Name: Date of Service: [name], [name] 11/24/2021 9:15 A M Medical Record Number: Bobbe Medico Patient Account Number: 11/26/2021 Date of Birth/Sex: Treating RN: Aug 29, 1960 (61 y.o. 01/04/1961, Small Primary Care Richrd Kuzniar: 76 Other Clinician: Referring Carleena Mires: Treating Remmie Bembenek/Extender: Shawn Merl in Treatment: 13 Edema Assessment Assessed: [name]: No] [name]: No] Edema: [Left: Yes] [Right: Yes] Calf Left: Right: Point of Measurement: From Medial Instep 34 cm 34 cm Ankle Left: Right: Point of Measurement: From Medial Instep 22.2 cm 22 cm Electronic Signature(s) Signed: 11/25/2021 10:35:58 AM By: Fonnie Mu Signed: 11/25/2021 3:58:21 PM By: 11/26/2021 RN Entered By: Bobbe Medico on 11/24/2021 10:01:50 -------------------------------------------------------------------------------- Multi-Disciplinary Care Plan Details Patient Name: Date of Service: [name], [name] 11/24/2021 9:15 A M Medical Record Number: 01/04/1961 Patient Account Number: [id number] Date of Birth/Sex: Treating RN: 1960-05-11 (61 y.o. [name], Small Primary Care Ancelmo Hunt: [name] Other Clinician: Referring Hosteen Kienast: Treating Addyson Traub/Extender: [name] in Treatment: 13 Active Inactive Electronic Signature(s) Signed: 11/25/2021 3:58:21 PM By: Shawn Dallas RN Entered By: 11/27/2021 on 11/24/2021 10:13:12 -------------------------------------------------------------------------------- Pain Assessment Details Patient Name: Date of Service: [name], [name] 11/24/2021 9:15 A M Medical Record Number: Bobbe Medico Patient Account Number: 11/26/2021 Date of Birth/Sex: Treating RN: 1960-04-30 (61 y.o. 01/04/1961, Small Primary Care Banessa Mao: 76 Other Clinician: Referring Verla Bryngelson: Treating Alayah Knouff/Extender: Shawn Merl in Treatment: 13 Active Problems Location of Pain Severity and Description of Pain Patient Has Paino No Site Locations Pain Management and Medication Current Pain  Management: Electronic Signature(s) Signed: 11/25/2021 10:35:58 AM By: [name] Signed: 11/25/2021 3:58:21 PM By: Fonnie Mu RN Entered By: [name] on 11/24/2021 09:39:11 -------------------------------------------------------------------------------- Patient/Caregiver Education Details Patient Name: Date of Service: Bobbe Medico 8/23/2023andnbsp9:15 A M Medical Record Number: [id number] Patient Account Number: [id number] Date of Birth/Gender: Treating RN: 12-11-1960 (61 y.o. Lucious Groves Primary Care Physician: [name] Other Clinician: Referring Physician: Treating Physician/Extender: [name] in Treatment: 13 Education Assessment Education Provided To: Patient Education Topics Provided Wound/Skin Impairment: Methods: Explain/Verbal Responses: Reinforcements needed, State content correctly [employer]) Signed: 11/25/2021 3:58:21 PM By: Fonnie Mu RN Entered By: Fonnie Mu on 11/24/2021 09:48:38 -------------------------------------------------------------------------------- Wound Assessment Details Patient Name: Date of Service: [name], [name] 11/24/2021 9:15 A M Medical Record Number: [id number] Patient Account Number: [id number] Date of  Birth/Sex: Treating RN: 12-16-1960 (61 y.o. [name] Primary Care Owain Eckerman: [name] Other Clinician: Referring Laikyn Gewirtz: Treating Lizzet Hendley/Extender: [name] in Treatment: 13 Wound Status Wound Number: 5 Primary Etiology: Venous Leg Ulcer Wound Location: Right, Circumferential Lower Leg Wound Status: Healed - Epithelialized Wounding Event: Gradually Appeared Comorbid History: Congestive Heart Failure, Hypertension Date Acquired: 07/03/2021 Weeks Of Treatment: 13 Clustered Wound: No Photos Wound Measurements Length: (cm) Width: (cm) Depth: (cm) Area: (cm) Volume: (cm) 0 % Reduction in Area: 100% 0 % Reduction in Volume: 100% 0 Epithelialization: Large (67-100%) 0 Tunneling: No 0 Undermining: No Wound Description Classification: Full Thickness With Exposed Support Structures Wound Margin: Distinct, outline attached Exudate Amount: Medium Exudate Type: Serosanguineous Exudate Color: red, brown Foul Odor After Cleansing: No Slough/Fibrino No Wound Bed Granulation Amount: Large (67-100%) Exposed Structure Granulation Quality: Red, Pink Fascia Exposed: No Necrotic Amount: None Present (0%) Fat Layer (Subcutaneous Tissue) Exposed: No Tendon Exposed: No Muscle Exposed: No Joint Exposed: No Bone Exposed: No Treatment Notes Wound #5 (Lower Leg) Wound Laterality: Right, Circumferential Cleanser Peri-Wound Care Topical Primary Dressing Secondary Dressing Secured With Compression Wrap Compression Stockings Add-Ons Electronic Signature(s) Signed: 11/25/2021 3:58:21 PM By: Fonnie Mu RN Entered By: Fonnie Mu on 11/24/2021 10:12:48 -------------------------------------------------------------------------------- Wound Assessment Details Patient Name: Date of Service: [name], [name] 11/24/2021 9:15 A M Medical Record Number: [id number] Patient Account Number: [id number] Date of Birth/Sex: Treating  RN: Mar 06, 1961 (61 y.o. [name] Primary Care Atia Haupt: [name] Other Clinician: Referring Annya Lizana: Treating Courtnay Petrilla/Extender: [name] in Treatment: 13 Wound Status Wound Number: 6 Primary Etiology: Venous Leg Ulcer Wound Location: Left, Circumferential Lower Leg Wound Status: Healed - Epithelialized Wounding Event: Gradually Appeared Comorbid History: Congestive Heart Failure, Hypertension Date Acquired: 07/03/2021 Weeks Of Treatment: 13 Clustered Wound: No Photos Wound Measurements Length: (cm) Width: (cm) Depth: (cm) Area: (cm) Volume: (cm) 0 % Reduction in Area: 100% 0 % Reduction in Volume: 100% 0 Epithelialization: Large (67-100%) 0 Tunneling: No 0 Undermining: No Wound Description Classification: Full Thickness With Exposed Support Structures Wound Margin: Distinct, outline attached Exudate Amount: Medium Exudate Type: Serosanguineous Exudate Color: red, brown Foul Odor After Cleansing: No Slough/Fibrino No Wound Bed Granulation Amount: None Present (0%) Exposed Structure Necrotic Amount: None Present (0%) Fascia Exposed: No Fat Layer (Subcutaneous Tissue) Exposed: No Tendon Exposed: No Muscle Exposed: No Joint Exposed: No Bone Exposed: No Treatment Notes Wound #6 (Lower Leg) Wound Laterality: Left, Circumferential Cleanser Peri-Wound Care Topical Primary Dressing Secondary Dressing Secured With Compression Wrap Compression Stockings Add-Ons Electronic Signature(s) Signed: 11/25/2021 3:58:21 PM By: Fonnie Mu RN Entered By: Fonnie Mu on 11/24/2021 10:12:49 -------------------------------------------------------------------------------- Vitals  Details Patient Name: Date of Service: [name], [name] 11/24/2021 9:15 A M Medical Record Number: [id number] Patient Account Number: [id number] Date of Birth/Sex: Treating RN: 09-03-1960 (61 y.o. [name] Primary Care Deyani Hegarty:  [name] Other Clinician: Referring Devorah Givhan: Treating Jobin Montelongo/Extender: [name] in Treatment: 13 Vital Signs Time Taken: 09:38 Temperature (F): 98.1 Height (in): 69 Pulse (bpm): 77 Weight (lbs): 215 Respiratory Rate (breaths/min): 18 Body Mass Index (BMI): 31.7 Blood Pressure (mmHg): 121/82 Reference Range: 80 - 120 mg / dl Electronic Signature(s) Signed: 11/25/2021 10:35:58 AM By: [name] Entered By: [name] on 11/24/2021 09:38:59

## 2021-11-25 NOTE — Telephone Encounter (Signed)
Pt was called and no vm was left due to mailbox not being set up.Information was sent to nurse pool.  

## 2021-11-25 NOTE — Telephone Encounter (Signed)
-----   Message from Storm Frisk, MD sent at 11/24/2021  6:08 AM EDT ----- Let pt know kidney is stable, potassium normal, magnesium is normal no medication changes, copied result to dr Bjorn Pippin
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
## 2021-12-07 ENCOUNTER — Encounter: Payer: Self-pay | Admitting: Cardiology

## 2021-12-07 ENCOUNTER — Ambulatory Visit: Payer: Medicare Other | Attending: Cardiology | Admitting: Cardiology

## 2021-12-07 VITALS — BP 128/72 | HR 87 | Ht 69.0 in | Wt 203.8 lb

## 2021-12-07 DIAGNOSIS — I517 Cardiomegaly: Secondary | ICD-10-CM | POA: Diagnosis not present

## 2021-12-07 DIAGNOSIS — I4819 Other persistent atrial fibrillation: Secondary | ICD-10-CM

## 2021-12-07 DIAGNOSIS — I5032 Chronic diastolic (congestive) heart failure: Secondary | ICD-10-CM | POA: Diagnosis not present

## 2021-12-07 DIAGNOSIS — I10 Essential (primary) hypertension: Secondary | ICD-10-CM | POA: Diagnosis not present

## 2021-12-07 DIAGNOSIS — I739 Peripheral vascular disease, unspecified: Secondary | ICD-10-CM

## 2021-12-07 DIAGNOSIS — I350 Nonrheumatic aortic (valve) stenosis: Secondary | ICD-10-CM

## 2021-12-07 LAB — BASIC METABOLIC PANEL
BUN/Creatinine Ratio: 20 (ref 10–24)
BUN: 36 mg/dL — ABNORMAL HIGH (ref 8–27)
CO2: 25 mmol/L (ref 20–29)
Calcium: 10 mg/dL (ref 8.6–10.2)
Chloride: 101 mmol/L (ref 96–106)
Creatinine, Ser: 1.77 mg/dL — ABNORMAL HIGH (ref 0.76–1.27)
Glucose: 102 mg/dL — ABNORMAL HIGH (ref 70–99)
Potassium: 4 mmol/L (ref 3.5–5.2)
Sodium: 139 mmol/L (ref 134–144)
eGFR: 43 mL/min/{1.73_m2} — ABNORMAL LOW (ref >59)

## 2021-12-07 LAB — MAGNESIUM: Magnesium: 2.1 mg/dL (ref 1.6–2.3)

## 2021-12-07 MED ORDER — OLMESARTAN MEDOXOMIL 40 MG PO TABS
40.0000 mg | ORAL_TABLET | Freq: Every day | ORAL | 3 refills | Status: DC
Start: 2021-12-07 — End: 2022-01-06

## 2021-12-07 MED ORDER — OMEPRAZOLE 40 MG PO CPDR: 40.0000 mg | DELAYED_RELEASE_CAPSULE | Freq: Every day | ORAL | 3 refills | Status: DC

## 2021-12-07 NOTE — Patient Instructions (Signed)
Medication Instructions:  Your physician recommends that you continue on your current medications as directed. Please refer to the Current Medication list given to you today.  *If you need a refill on your cardiac medications before your next appointment, please call your pharmacy*   Lab Work: BMET, [place] today  If you have labs (blood work) drawn today and your tests are completely normal, you will receive your results only by: MyChart Message (if you have MyChart) OR A paper copy in the mail If you have any lab test that is abnormal or we need to change your treatment, we will call you to review the results.  Follow-Up: At [facility], you and your health needs are our priority.  As part of our continuing mission to provide you with exceptional heart care, we have created designated Provider Care Teams.  These Care Teams include your primary Cardiologist (physician) and Advanced Practice Providers (APPs -  Physician Assistants and Nurse Practitioners) who all work together to provide you with the care you need, when you need it.  We recommend signing up for the patient portal called "MyChart".  Sign up information is provided on this After Visit Summary.  MyChart is used to connect with patients for Virtual Visits (Telemedicine).  Patients are able to view lab/test results, encounter notes, upcoming appointments, etc.  Non-urgent messages can be sent to your provider as well.   To learn more about what you can do with MyChart, go to [email or web address].    Your next appointment:   3 month(s)  The format for your next appointment:   In Person  Provider:   Little Ishikawa, MD

## 2021-12-14 ENCOUNTER — Encounter: Payer: Self-pay | Admitting: *Deleted

## 2021-12-14 NOTE — Addendum Note (Signed)
Addended by: Reesa Chew on: 12/14/2021 12:04 PM   Modules accepted: Orders

## 2021-12-15 ENCOUNTER — Ambulatory Visit: Payer: Medicare Other | Admitting: Podiatry

## 2021-12-16 ENCOUNTER — Ambulatory Visit (HOSPITAL_BASED_OUTPATIENT_CLINIC_OR_DEPARTMENT_OTHER): Payer: Medicare Other | Attending: Cardiology | Admitting: Cardiology

## 2021-12-16 ENCOUNTER — Encounter (HOSPITAL_BASED_OUTPATIENT_CLINIC_OR_DEPARTMENT_OTHER): Payer: Medicare Other | Admitting: Cardiology

## 2021-12-16 VITALS — Ht 69.0 in | Wt 213.0 lb

## 2021-12-16 DIAGNOSIS — G4733 Obstructive sleep apnea (adult) (pediatric): Secondary | ICD-10-CM | POA: Insufficient documentation

## 2021-12-16 DIAGNOSIS — R400 Somnolence: Secondary | ICD-10-CM | POA: Diagnosis not present

## 2021-12-19 NOTE — Procedures (Signed)
   Patient Name: Doug, Bucklin Date: 12/16/2021 Gender: Male D.O.B: Sep 25, 1960 Age (years): 18 Referring Provider: Fransico Him MD, ABSM Height (inches): 69 Interpreting Physician: Fransico Him MD, ABSM Weight (lbs): 213 RPSGT: Baxter Flattery BMI: 31 MRN: [id number] Neck Size: 16.00  CLINICAL INFORMATION The patient is referred for a BiPAP titration to treat sleep apnea.  SLEEP STUDY TECHNIQUE As per the AASM Manual for the Scoring of Sleep and Associated Events v2.3 (April 2016) with a hypopnea requiring 4% desaturations.  The channels recorded and monitored were frontal, central and occipital EEG, electrooculogram (EOG), submentalis EMG (chin), nasal and oral airflow, thoracic and abdominal wall motion, anterior tibialis EMG, snore microphone, electrocardiogram, and pulse oximetry. Bilevel positive airway pressure (BPAP) was initiated at the beginning of the study and titrated to treat sleep-disordered breathing.  MEDICATIONS Medications self-administered by patient taken the night of the study : N/A  RESPIRATORY PARAMETERS Optimal IPAP Pressure (cm): 17  AHI at Optimal Pressure (/hr) 0 Optimal EPAP Pressure (cm):13  Overall Minimal O2 (%):74.0  Minimal O2 at Optimal Pressure (%): 88.0  SLEEP ARCHITECTURE Start Time:10:44:09 PM  Stop Time:4:44:00 AM  Total Time (min):359.9  Total Sleep Time (min):357.5 Sleep Latency (min):0.3  Sleep Efficiency (%):99.3%  REM Latency (min):26.0  WASO (min): 2.0 Stage N1 (%): 0.4%  Stage N2 (%): 69.0%  Stage N3 (%): 0.0%  Stage R (%):30.6 Supine (%):100.00  Arousal Index (/hr):1.7   CARDIAC DATA The 2 lead EKG demonstrated atrial fibrillation. The mean heart rate was 72.5 beats per minute. Other EKG findings include: PVCs.  LEG MOVEMENT DATA The total Periodic Limb Movements of Sleep (PLMS) were 0. The PLMS index was 0.0. A PLMS index of <15 is considered normal in adults.  IMPRESSIONS - An optimal PAP pressure was selected  for this patient (17/13 cm of water). - Central sleep apnea was not noted during this titration (CAI = 1.2/h). - Severe oxygen desaturations were observed during this titration (min O2 = 74.0%). - No snoring was audible during this study. - No cardiac abnormalities were observed during this study. - Clinically significant periodic limb movements were not noted during this study. Arousals associated with PLMs were rare.  DIAGNOSIS - Obstructive Sleep Apnea (G47.33)  RECOMMENDATIONS - Trial of auto BiPAP therapy with IPAP max 20cm H2O, EPAP min 5cm H2O and PS 4cm H2O with a Large size Fisher&Paykel Full Face Simplus mask and heated humidification. - Avoid alcohol, sedatives and other CNS depressants that may worsen sleep apnea and disrupt normal sleep architecture. - Sleep hygiene should be reviewed to assess factors that may improve sleep quality. - Weight management and regular exercise should be initiated or continued. - Return to Sleep Center for re-evaluation after 6 weeks of therapy  [Electronically signed] 12/19/2021 07:08 PM  Fransico Him MD, ABSM Diplomate, American Board of Sleep Medicine

## 2021-12-23 ENCOUNTER — Other Ambulatory Visit: Payer: Self-pay | Admitting: *Deleted

## 2021-12-23 ENCOUNTER — Institutional Professional Consult (permissible substitution) (INDEPENDENT_AMBULATORY_CARE_PROVIDER_SITE_OTHER): Payer: Medicare Other | Admitting: Surgery

## 2021-12-23 VITALS — BP 100/64 | HR 74 | Resp 20 | Ht 69.0 in | Wt 213.0 lb

## 2021-12-23 DIAGNOSIS — I350 Nonrheumatic aortic (valve) stenosis: Secondary | ICD-10-CM

## 2021-12-24 ENCOUNTER — Telehealth: Payer: Self-pay | Admitting: *Deleted

## 2021-12-24 ENCOUNTER — Encounter: Payer: Self-pay | Admitting: Surgery

## 2021-12-24 ENCOUNTER — Encounter (HOSPITAL_COMMUNITY): Payer: Self-pay

## 2021-12-24 NOTE — Telephone Encounter (Signed)
-----   Message from Lauralee Evener, [place] sent at 12/20/2021  8:20 AM EDT -----  ----- Message ----- From: Sueanne Margarita, MD Sent: 12/19/2021   7:19 PM EDT To: [facility] Sleep Studies  Please let patient know that they had a successful PAP titration and let DME know that orders are in EPIC.  Please set up 6 week OV with me.

## 2021-12-24 NOTE — Progress Notes (Signed)
HPI:  The patient is a 61 year old gentleman with history of difficult to control hypertension, hyperlipidemia, chronic diastolic congestive heart failure, OSA on CPAP, recently diagnosed atrial fibrillation, gout, GERD, and aortic stenosis.  A 2D echo in January 2022 showed a severely calcified and thickened aortic valve with a mean gradient of 37.5 mmHg.  A follow-up last July showed a mean gradient of 33 mmHg.  His most recent echo on 03/18/2021 showed a mean gradient of 33.3 mmHg with peak gradient 64 mmHg.  Valve area by VTI was 1.05 cm.  Dimensionless index was 0.3.  Stroke-volume index was 33.  Left ventricular ejection fraction was 70 to 75% with severe asymmetric left ventricular hypertrophy.  Left and right atrial sizes were severely dilated.  There was mild mitral regurgitation.  He underwent a cardiac MR on 05/03/2021 showing severe LVH with no evidence of amyloidosis.  Cardiac catheterization on 05/27/2021 showed mild nonobstructive disease in the proximal and mid LAD.  There was a 90% distal LAD stenosis near the apex.  There was a 90% large second diagonal stenosis.  Left circumflex was a large vessel with moderate distal stenosis.  The right coronary artery was a large dominant vessel with mild proximal stenosis.  The PDA and posterolateral branches had moderate diffuse disease.  He was diagnosed with atrial fibrillation during his echocardiogram in December 2022 and remained in atrial fibrillation when he was seen in cardiology clinic in January.  He was started on Eliquis and continued on Coreg for rate control.  I saw him in the office on 06/11/2021 for initial consultation at that time he had a large amount of swelling in his right knee which he had injured in the past.  He had a difficult time walking on it.  I recommended open surgical aortic valve replacement and consideration of Maze procedure at that time.  He was instructed to call our office after his knee swelling improved to  schedule surgery.  Unfortunately he developed some lower extremity wounds related to the swelling.  He subsequently underwent left tibioperoneal trunk angioplasty and left peroneal artery angioplasty by Dr. Stanford Breed on 10/22/2021.  He has been going to the wound care clinic and his leg wounds have completely healed.  He continues to wear compression stockings.  He says that he feels fairly well.  He has mild exertional shortness of breath and fatigue.  He denies any dizziness or syncope.  He has had no orthopnea.  His peripheral edema has essentially resolved.  He denies any chest pain or pressure.  Current Outpatient Medications  Medication Sig Dispense Refill   acetaminophen (TYLENOL) 500 MG tablet Take 500-1,000 mg by mouth every 6 (six) hours as needed for moderate pain.     allopurinol (ZYLOPRIM) 100 MG tablet TAKE 1 TABLET (100 MG TOTAL) BY MOUTH DAILY. 30 tablet 6   apixaban (ELIQUIS) 5 MG TABS tablet Take 1 tablet (5 mg total) by mouth 2 (two) times daily. 180 tablet 3   aspirin EC 81 MG tablet Take 81 mg by mouth daily. Swallow whole.     atorvastatin (LIPITOR) 40 MG tablet Take 1 tablet (40 mg total) by mouth daily. 90 tablet 2   carvedilol (COREG) 25 MG tablet Take 1 tablet (25 mg total) by mouth 2 (two) times daily with a meal. 180 tablet 3   cloNIDine (CATAPRES) 0.2 MG tablet Take 1 tablet (0.2 mg total) by mouth 3 (three) times daily. 270 tablet 3   dapagliflozin propanediol (FARXIGA) 10  MG TABS tablet Take 1 tablet (10 mg total) by mouth every morning. 30 tablet 11   furosemide (LASIX) 40 MG tablet Take 1 tablet (40 mg total) by mouth 3 (three) times daily. 270 tablet 3   gabapentin (NEURONTIN) 400 MG capsule TAKE 2 CAPSULES(800 MG) BY MOUTH THREE TIMES DAILY 180 capsule 2   hydrALAZINE (APRESOLINE) 50 MG tablet Take 50 mg by mouth 3 (three) times daily.     magnesium oxide (MAG-OX) 400 MG tablet Take 1 tablet (400 mg total) by mouth every other day. 90 tablet 3   olmesartan (BENICAR)  40 MG tablet Take 1 tablet (40 mg total) by mouth daily. 90 tablet 3   omeprazole (PRILOSEC) 40 MG capsule Take 1 capsule (40 mg total) by mouth daily. 90 capsule 3   potassium chloride SA (KLOR-CON M) 20 MEQ tablet Take 10 mEq by mouth 2 (two) times a week.     spironolactone (ALDACTONE) 50 MG tablet Take 1 tablet (50 mg total) by mouth daily. 90 tablet 3   No current facility-administered medications for this visit.     Physical Exam: BP 100/64   Pulse 74   Resp 20   Ht 5\' 9"  (1.753 m)   Wt 213 lb (96.6 kg)   SpO2 99% Comment: RA  BMI 31.45 kg/m  He looks well. Cardiac exam shows an irregular rate and rhythm with a 3/6 systolic murmur along the right sternal border.  There is no diastolic murmur. Lungs are clear. He is wearing bilateral compression devices.  There is no peripheral edema.  Diagnostic Tests:  Physicians  Panel Physicians Referring Physician Case Authorizing Physician  Burnell Blanks, MD (Primary)     Procedures  RIGHT/LEFT HEART CATH AND CORONARY ANGIOGRAPHY   Conclusion      Ost RCA to Prox RCA lesion is 40% stenosed.   Prox RCA to Mid RCA lesion is 20% stenosed.   RPDA lesion is 30% stenosed.   RPAV lesion is 50% stenosed.   Dist Cx lesion is 50% stenosed.   2nd Diag lesion is 90% stenosed.   Prox LAD to Mid LAD lesion is 30% stenosed.   Dist LAD lesion is 90% stenosed.   The LAD is a large caliber vessel that courses to the apex. There is mild non-obstructive disease in the proximal and mid LAD. The apical LAD has diffuse severe stenosis. The first Diagonal is a moderate caliber vessel with mild non-obstructive disease. The second Diagonal branch is a moderate caliber vessel with a focal severe stenosis The Circumflex is a large caliber vessel with moderate distal stenosis The RCA is a large dominant artery with mild eccentric proximal stenosis. The PDA and posterolateral arteries have moderate diffuse disease.  Severe aortic stenosis by  echo. The aortic valve was not crossed today.    RA 14 RV 42/12/19 PA 44/22 (mean 27) PCWP 19 AO 103/64   Will continue workup for AVR. Given his severe LVH and young age, he may be better treated with surgical AVR than TAVR. Will review with our valve team and make arrangements for CT scans if felt necessary before being seen by our CT surgeon.    Indications  Severe aortic stenosis [I35.0 (ICD-10-CM)]   Procedural Details  Technical Details Indication: 61 yo male with severe aortic stenosis, workup for TAVR vs surgical AVR  Procedure: The risks, benefits, complications, treatment options, and expected outcomes were discussed with the patient. The patient and/or family concurred with the proposed plan, giving informed  consent. The patient was brought to the cath lab after IV hydration was given. The patient was sedated with Versed and Fentanyl. The IV catheter in the right wrist was changed for a 5 French sheath. Right heart catheterization performed with a balloon tipped catheter. The right wrist was prepped and draped in a sterile fashion. 1% lidocaine was used for local anesthesia. Using the modified Seldinger access technique, a 5 French sheath was placed in the right radial artery. 3 mg Verapamil was given through the sheath. Weight based IV heparin was given. Standard diagnostic catheters were used to perform selective coronary angiography. I did not cross the aortic valve. All catheter exchanges were performed over an exchange length guidewire.   The sheath was removed from the right radial artery and a Terumo hemostasis band was applied at the arteriotomy site on the right wrist.      Estimated blood loss <50 mL.   During this procedure medications were administered to achieve and maintain moderate conscious sedation while the patient's heart rate, blood pressure, and oxygen saturation were continuously monitored and I was present face-to-face 100% of this time.    Medications (Filter: Administrations occurring from [phone] to 1029 on 05/27/21) Heparin (Porcine) in NaCl 1000-0.9 UT/500ML-% SOLN (mL) Total volume:  1,000 mL  Date/Time Rate/Dose/Volume Action   05/27/21 0956 500 mL Given   0957 500 mL Given    fentaNYL (SUBLIMAZE) injection (mcg) Total dose:  50 mcg  Date/Time Rate/Dose/Volume Action   05/27/21 1004 50 mcg Given    midazolam (VERSED) injection (mg) Total dose:  2 mg  Date/Time Rate/Dose/Volume Action   05/27/21 1004 2 mg Given    Radial Cocktail/Verapamil only Total dose:  Cannot be calculated*  *Administration dose not documented Date/Time Rate/Dose/Volume Action   05/27/21 1011  Given    heparin sodium (porcine) injection (Units) Total dose:  5,000 Units  Date/Time Rate/Dose/Volume Action   05/27/21 1018 5,000 Units Given    iohexol (OMNIPAQUE) 350 MG/ML injection (mL) Total volume:  50 mL  Date/Time Rate/Dose/Volume Action   05/27/21 1027 50 mL Given    Sedation Time  Sedation Time Physician-1: 20 minutes 26 seconds Contrast  Medication Name Total Dose  iohexol (OMNIPAQUE) 350 MG/ML injection 50 mL   Radiation/Fluoro  Fluoro time: 3.1 (min) DAP: 20080 (mGycm2) Cumulative Air Kerma: 962 (mGy) Complications  Complications documented before study signed (05/27/2021 95:28 AM)   No complications were associated with this study.  Documented by Jamie Kato - 05/27/2021  9:57 AM     Coronary Findings  Diagnostic Dominance: Right Left Anterior Descending  Vessel is large.  Prox LAD to Mid LAD lesion is 30% stenosed.  Dist LAD lesion is 90% stenosed.    Second Diagonal Branch  Vessel is moderate in size.  2nd Diag lesion is 90% stenosed.    Left Circumflex  Vessel is large.  Dist Cx lesion is 50% stenosed.    Right Coronary Artery  Vessel is large.  Ost RCA to Prox RCA lesion is 40% stenosed. The lesion is eccentric.  Prox RCA to Mid RCA lesion is 20% stenosed.    Right Posterior  Descending Artery  RPDA lesion is 30% stenosed.    Right Posterior Atrioventricular Artery  RPAV lesion is 50% stenosed.    Intervention   No interventions have been documented.   Coronary Diagrams  Diagnostic Dominance: Right  Intervention  Implants     No implant documentation for this case.   Syngo Images  Show images for CARDIAC CATHETERIZATION Images on Long Term Storage   Show images for [place], [place] to Procedure Log  Procedure Log    Hemo Data  Flowsheet Row Most Recent Value  Fick Cardiac Output 7.06 L/min  Fick Cardiac Output Index 3.32 (L/min)/BSA  RA A Wave 15 mmHg  RA V Wave 16 mmHg  RA Mean 15 mmHg  RV Systolic Pressure 42 mmHg  RV Diastolic Pressure 10 mmHg  RV EDP 17 mmHg  PA Systolic Pressure 42 mmHg  PA Diastolic Pressure 22 mmHg  PA Mean 31 mmHg  PW A Wave 19 mmHg  PW V Wave 25 mmHg  PW Mean 20 mmHg  AO Systolic Pressure A999333 mmHg  AO Diastolic Pressure 74 mmHg  AO Mean 84 mmHg  QP/QS 1  TPVR Index 9.34 HRUI  TSVR Index 24.09 HRUI  PVR SVR Ratio 0.17  TPVR/TSVR Ratio 0.39    Impression:  This 61 year old gentleman has stage D, severe, symptomatic aortic stenosis with NYHA class II symptoms of exertional fatigue and shortness of breath consistent with chronic diastolic congestive heart failure.  His last echo was done in December 2022 and showed a mean gradient of 33 mmHg with a peak gradient of 64 mmHg.  Valve area was 1.05 cm.  There was no mitral valve regurgitation or stenosis.  Left ventricular ejection fraction was 70 to 75% with severe  left ventricular hypertrophy.  His cardiac catheterization showed a 90% stenosis of a large second diagonal branch which appeared graftable.  The LAD had a 90% distal lesion near the apex.  The RCA and left circumflex had moderate nonobstructive disease.  He also has persistent atrial fibrillation that has been treated with rate control and anticoagulation with Eliquis.  I think the best treatment  for this gentleman is aortic valve replacement using a bioprosthetic valve, bypass of the diagonal branch, and biatrial Maze procedure. I discussed the operative procedure with the patient  including alternatives, benefits and risks; including but not limited to bleeding, blood transfusion, infection, stroke, myocardial infarction, graft failure, heart block requiring a permanent pacemaker, organ dysfunction, and death.  [name] understands and agrees to proceed.    Plan:  He is going to have a repeat 2D echocardiogram since his last one was done in December 2022.  We will review that study and then schedule AVR using a bioprosthetic valve, coronary bypass to the diagonal branch, and biatrial Maze procedure.  I spent 20 minutes performing this established patient evaluation and > 50% of this time was spent face to face counseling and coordinating the care of this patient's severe symptomatic aortic stenosis, coronary disease, and persistent atrial fibrillation.  Gaye Pollack, MD [facility] [phone]

## 2021-12-24 NOTE — Telephone Encounter (Signed)
The patient has been notified of the result and verbalized understanding.  All questions (if any) were answered. [name], [place] 12/24/2021 2:50 PM    Upon patient request DME selection is [name] Patient understands he will be contacted by [facility] to set up his cpap. Patient understands to call if [facility] does not contact him with new setup in a timely manner. Patient understands they will be called once confirmation has been received from [facility] that they have received their new machine to schedule 10 week follow up appointment.   [facility] notified of new cpap order  Please add to airview Patient was grateful for the call and thanked me.

## 2021-12-28 ENCOUNTER — Ambulatory Visit (HOSPITAL_COMMUNITY)
Admission: RE | Admit: 2021-12-28 | Discharge: 2021-12-28 | Disposition: A | Discharge status: Home / Self Care | Payer: Medicare Other | Source: Ambulatory Visit | Attending: Surgery | Admitting: Surgery

## 2021-12-28 DIAGNOSIS — I4891 Unspecified atrial fibrillation: Secondary | ICD-10-CM | POA: Insufficient documentation

## 2021-12-28 DIAGNOSIS — G4730 Sleep apnea, unspecified: Secondary | ICD-10-CM | POA: Diagnosis not present

## 2021-12-28 DIAGNOSIS — R609 Edema, unspecified: Secondary | ICD-10-CM | POA: Diagnosis not present

## 2021-12-28 DIAGNOSIS — R0600 Dyspnea, unspecified: Secondary | ICD-10-CM | POA: Diagnosis not present

## 2021-12-28 DIAGNOSIS — I509 Heart failure, unspecified: Secondary | ICD-10-CM | POA: Insufficient documentation

## 2021-12-28 DIAGNOSIS — E785 Hyperlipidemia, unspecified: Secondary | ICD-10-CM | POA: Diagnosis not present

## 2021-12-28 DIAGNOSIS — R5383 Other fatigue: Secondary | ICD-10-CM | POA: Insufficient documentation

## 2021-12-28 DIAGNOSIS — I739 Peripheral vascular disease, unspecified: Secondary | ICD-10-CM | POA: Diagnosis not present

## 2021-12-28 DIAGNOSIS — I080 Rheumatic disorders of both mitral and aortic valves: Secondary | ICD-10-CM | POA: Insufficient documentation

## 2021-12-28 DIAGNOSIS — I110 Hypertensive heart disease with heart failure: Secondary | ICD-10-CM | POA: Insufficient documentation

## 2021-12-28 DIAGNOSIS — I352 Nonrheumatic aortic (valve) stenosis with insufficiency: Secondary | ICD-10-CM | POA: Diagnosis present

## 2021-12-28 DIAGNOSIS — I350 Nonrheumatic aortic (valve) stenosis: Secondary | ICD-10-CM

## 2021-12-28 LAB — ECHOCARDIOGRAM COMPLETE
AR max vel: 0.84 cm2
AV Area VTI: 0.97 cm2
AV Area mean vel: 0.95 cm2
AV Mean grad: 29 mmHg
AV Peak grad: 60 mmHg
Ao pk vel: 3.87 m/s
Area-P 1/2: 3 cm2
MV M vel: 6.01 m/s
MV Peak grad: 144.5 mmHg
S' Lateral: 2.1 cm

## 2021-12-29 ENCOUNTER — Encounter: Payer: Self-pay | Admitting: *Deleted

## 2022-01-01 ENCOUNTER — Other Ambulatory Visit: Payer: Self-pay

## 2022-01-01 ENCOUNTER — Inpatient Hospital Stay (HOSPITAL_COMMUNITY): Payer: Medicare Other

## 2022-01-01 ENCOUNTER — Emergency Department (HOSPITAL_COMMUNITY): Payer: Medicare Other

## 2022-01-01 ENCOUNTER — Inpatient Hospital Stay (HOSPITAL_COMMUNITY)
Admission: EM | Admit: 2022-01-01 | Discharge: 2022-01-06 | DRG: 871 | Disposition: A | Discharge status: Home Health | Payer: Medicare Other | Attending: Family Medicine | Admitting: Family Medicine

## 2022-01-01 ENCOUNTER — Encounter (HOSPITAL_COMMUNITY): Payer: Self-pay | Admitting: Emergency Medicine

## 2022-01-01 DIAGNOSIS — M109 Gout, unspecified: Secondary | ICD-10-CM | POA: Diagnosis present

## 2022-01-01 DIAGNOSIS — E669 Obesity, unspecified: Secondary | ICD-10-CM | POA: Diagnosis present

## 2022-01-01 DIAGNOSIS — M1711 Unilateral primary osteoarthritis, right knee: Secondary | ICD-10-CM | POA: Diagnosis not present

## 2022-01-01 DIAGNOSIS — Z841 Family history of disorders of kidney and ureter: Secondary | ICD-10-CM

## 2022-01-01 DIAGNOSIS — I110 Hypertensive heart disease with heart failure: Secondary | ICD-10-CM | POA: Diagnosis present

## 2022-01-01 DIAGNOSIS — M00261 Other streptococcal arthritis, right knee: Secondary | ICD-10-CM | POA: Diagnosis not present

## 2022-01-01 DIAGNOSIS — N1831 Chronic kidney disease, stage 3a: Secondary | ICD-10-CM | POA: Diagnosis not present

## 2022-01-01 DIAGNOSIS — I5023 Acute on chronic systolic (congestive) heart failure: Secondary | ICD-10-CM | POA: Diagnosis present

## 2022-01-01 DIAGNOSIS — K219 Gastro-esophageal reflux disease without esophagitis: Secondary | ICD-10-CM | POA: Diagnosis not present

## 2022-01-01 DIAGNOSIS — Z7984 Long term (current) use of oral hypoglycemic drugs: Secondary | ICD-10-CM

## 2022-01-01 DIAGNOSIS — R6520 Severe sepsis without septic shock: Secondary | ICD-10-CM | POA: Diagnosis not present

## 2022-01-01 DIAGNOSIS — N1832 Chronic kidney disease, stage 3b: Secondary | ICD-10-CM | POA: Diagnosis present

## 2022-01-01 DIAGNOSIS — I5032 Chronic diastolic (congestive) heart failure: Secondary | ICD-10-CM | POA: Diagnosis not present

## 2022-01-01 DIAGNOSIS — Z7982 Long term (current) use of aspirin: Secondary | ICD-10-CM

## 2022-01-01 DIAGNOSIS — I739 Peripheral vascular disease, unspecified: Secondary | ICD-10-CM | POA: Diagnosis present

## 2022-01-01 DIAGNOSIS — N183 Chronic kidney disease, stage 3 unspecified: Secondary | ICD-10-CM | POA: Diagnosis present

## 2022-01-01 DIAGNOSIS — Z7901 Long term (current) use of anticoagulants: Secondary | ICD-10-CM

## 2022-01-01 DIAGNOSIS — Z8249 Family history of ischemic heart disease and other diseases of the circulatory system: Secondary | ICD-10-CM

## 2022-01-01 DIAGNOSIS — G4733 Obstructive sleep apnea (adult) (pediatric): Secondary | ICD-10-CM | POA: Diagnosis present

## 2022-01-01 DIAGNOSIS — Z6832 Body mass index (BMI) 32.0-32.9, adult: Secondary | ICD-10-CM

## 2022-01-01 DIAGNOSIS — E7800 Pure hypercholesterolemia, unspecified: Secondary | ICD-10-CM | POA: Diagnosis not present

## 2022-01-01 DIAGNOSIS — D509 Iron deficiency anemia, unspecified: Secondary | ICD-10-CM | POA: Diagnosis not present

## 2022-01-01 DIAGNOSIS — M62838 Other muscle spasm: Secondary | ICD-10-CM | POA: Diagnosis present

## 2022-01-01 DIAGNOSIS — E875 Hyperkalemia: Secondary | ICD-10-CM | POA: Diagnosis not present

## 2022-01-01 DIAGNOSIS — I878 Other specified disorders of veins: Secondary | ICD-10-CM | POA: Diagnosis present

## 2022-01-01 DIAGNOSIS — R410 Disorientation, unspecified: Secondary | ICD-10-CM | POA: Diagnosis not present

## 2022-01-01 DIAGNOSIS — R600 Localized edema: Secondary | ICD-10-CM | POA: Diagnosis not present

## 2022-01-01 DIAGNOSIS — E860 Dehydration: Secondary | ICD-10-CM | POA: Diagnosis not present

## 2022-01-01 DIAGNOSIS — I350 Nonrheumatic aortic (valve) stenosis: Secondary | ICD-10-CM | POA: Diagnosis not present

## 2022-01-01 DIAGNOSIS — I130 Hypertensive heart and chronic kidney disease with heart failure and stage 1 through stage 4 chronic kidney disease, or unspecified chronic kidney disease: Secondary | ICD-10-CM | POA: Diagnosis not present

## 2022-01-01 DIAGNOSIS — A400 Sepsis due to streptococcus, group A: Secondary | ICD-10-CM | POA: Diagnosis not present

## 2022-01-01 DIAGNOSIS — A419 Sepsis, unspecified organism: Secondary | ICD-10-CM | POA: Diagnosis not present

## 2022-01-01 DIAGNOSIS — G8929 Other chronic pain: Secondary | ICD-10-CM | POA: Diagnosis present

## 2022-01-01 DIAGNOSIS — I4819 Other persistent atrial fibrillation: Secondary | ICD-10-CM | POA: Diagnosis present

## 2022-01-01 DIAGNOSIS — N179 Acute kidney failure, unspecified: Secondary | ICD-10-CM | POA: Diagnosis not present

## 2022-01-01 DIAGNOSIS — I872 Venous insufficiency (chronic) (peripheral): Secondary | ICD-10-CM | POA: Diagnosis present

## 2022-01-01 DIAGNOSIS — J189 Pneumonia, unspecified organism: Secondary | ICD-10-CM | POA: Diagnosis present

## 2022-01-01 DIAGNOSIS — M549 Dorsalgia, unspecified: Secondary | ICD-10-CM | POA: Diagnosis not present

## 2022-01-01 DIAGNOSIS — L03115 Cellulitis of right lower limb: Secondary | ICD-10-CM | POA: Diagnosis present

## 2022-01-01 DIAGNOSIS — I10 Essential (primary) hypertension: Secondary | ICD-10-CM | POA: Diagnosis present

## 2022-01-01 DIAGNOSIS — I5033 Acute on chronic diastolic (congestive) heart failure: Secondary | ICD-10-CM | POA: Diagnosis present

## 2022-01-01 DIAGNOSIS — Z1152 Encounter for screening for COVID-19: Secondary | ICD-10-CM

## 2022-01-01 DIAGNOSIS — R531 Weakness: Secondary | ICD-10-CM | POA: Diagnosis not present

## 2022-01-01 DIAGNOSIS — M25461 Effusion, right knee: Secondary | ICD-10-CM | POA: Diagnosis not present

## 2022-01-01 DIAGNOSIS — Z79899 Other long term (current) drug therapy: Secondary | ICD-10-CM

## 2022-01-01 DIAGNOSIS — M25561 Pain in right knee: Secondary | ICD-10-CM | POA: Diagnosis not present

## 2022-01-01 DIAGNOSIS — Z825 Family history of asthma and other chronic lower respiratory diseases: Secondary | ICD-10-CM

## 2022-01-01 LAB — BLOOD CULTURE ID PANEL (REFLEXED) - BCID2

## 2022-01-01 LAB — CBC WITH DIFFERENTIAL/PLATELET
Abs Immature Granulocytes: 0.19 10*3/uL — ABNORMAL HIGH (ref 0.00–0.07)
Basophils Absolute: 0 10*3/uL (ref 0.0–0.1)
Basophils Relative: 0 %
Eosinophils Absolute: 0 10*3/uL (ref 0.0–0.5)
Eosinophils Relative: 0 %
HCT: 37.3 % — ABNORMAL LOW (ref 39.0–52.0)
Hemoglobin: 12.1 g/dL — ABNORMAL LOW (ref 13.0–17.0)
Immature Granulocytes: 1 %
Lymphocytes Relative: 5 %
Lymphs Abs: 1.1 10*3/uL (ref 0.7–4.0)
MCH: 23.7 pg — ABNORMAL LOW (ref 26.0–34.0)
MCHC: 32.4 g/dL (ref 30.0–36.0)
MCV: 73.1 fL — ABNORMAL LOW (ref 80.0–100.0)
Monocytes Absolute: 1.2 10*3/uL — ABNORMAL HIGH (ref 0.1–1.0)
Monocytes Relative: 6 %
Neutro Abs: 17.5 10*3/uL — ABNORMAL HIGH (ref 1.7–7.7)
Neutrophils Relative %: 88 %
Platelets: 156 10*3/uL (ref 150–400)
RBC: 5.1 MIL/uL (ref 4.22–5.81)
RDW: 14.5 % (ref 11.5–15.5)
WBC: 19.9 10*3/uL — ABNORMAL HIGH (ref 4.0–10.5)
nRBC: 0 % (ref 0.0–0.2)

## 2022-01-01 LAB — COMPREHENSIVE METABOLIC PANEL
ALT: 15 U/L (ref 0–44)
AST: 21 U/L (ref 15–41)
Albumin: 3.7 g/dL (ref 3.5–5.0)
Alkaline Phosphatase: 62 U/L (ref 38–126)
Anion gap: 13 (ref 5–15)
BUN: 47 mg/dL — ABNORMAL HIGH (ref 8–23)
CO2: 20 mmol/L — ABNORMAL LOW (ref 22–32)
Calcium: 9.7 mg/dL (ref 8.9–10.3)
Chloride: 101 mmol/L (ref 98–111)
Creatinine, Ser: 2.42 mg/dL — ABNORMAL HIGH (ref 0.61–1.24)
GFR, Estimated: 29 mL/min — ABNORMAL LOW (ref >60)
Glucose, Bld: 95 mg/dL (ref 70–99)
Potassium: 3.8 mmol/L (ref 3.5–5.1)
Sodium: 134 mmol/L — ABNORMAL LOW (ref 135–145)
Total Bilirubin: 1.2 mg/dL (ref 0.3–1.2)
Total Protein: 11.3 g/dL — ABNORMAL HIGH (ref 6.5–8.1)

## 2022-01-01 LAB — URINALYSIS, ROUTINE W REFLEX MICROSCOPIC
Bacteria, UA: NONE SEEN
Bilirubin Urine: NEGATIVE
Glucose, UA: 50 mg/dL — AB
Ketones, ur: NEGATIVE mg/dL
Leukocytes,Ua: NEGATIVE
Nitrite: NEGATIVE
Protein, ur: 30 mg/dL — AB
Specific Gravity, Urine: 1.013 (ref 1.005–1.030)
pH: 5 (ref 5.0–8.0)

## 2022-01-01 LAB — STREP PNEUMONIAE URINARY ANTIGEN: Strep Pneumo Urinary Antigen: NEGATIVE

## 2022-01-01 LAB — SARS CORONAVIRUS 2 BY RT PCR: SARS Coronavirus 2 by RT PCR: NEGATIVE

## 2022-01-01 LAB — PROTIME-INR
INR: 1.7 — ABNORMAL HIGH (ref 0.8–1.2)
Prothrombin Time: 20.2 seconds — ABNORMAL HIGH (ref 11.4–15.2)

## 2022-01-01 LAB — CBG MONITORING, ED: Glucose-Capillary: 104 mg/dL — ABNORMAL HIGH (ref 70–99)

## 2022-01-01 LAB — LACTIC ACID, PLASMA: Lactic Acid, Venous: 1.4 mmol/L (ref 0.5–1.9)

## 2022-01-01 MED ORDER — LACTATED RINGERS IV BOLUS (SEPSIS): 1000.0000 mL | Freq: Once | INTRAVENOUS | Status: DC

## 2022-01-01 MED ORDER — POLYETHYLENE GLYCOL 3350 17 G PO PACK
17.0000 g | PACK | Freq: Every day | ORAL | Status: DC | PRN
  Filled 2022-01-01: qty 1

## 2022-01-01 MED ORDER — ATORVASTATIN CALCIUM 10 MG PO TABS
20.0000 mg | ORAL_TABLET | Freq: Every day | ORAL | Status: DC
  Administered 2022-01-01 – 2022-01-06 (×6): 20 mg via ORAL
  Filled 2022-01-01 (×6): qty 2

## 2022-01-01 MED ORDER — ACETAMINOPHEN 500 MG PO TABS
1000.0000 mg | ORAL_TABLET | Freq: Once | ORAL | Status: AC
  Administered 2022-01-01: 1000 mg via ORAL
  Filled 2022-01-01: qty 2

## 2022-01-01 MED ORDER — HEPARIN (PORCINE) 25000 UT/250ML-% IV SOLN: 12.0000 [IU]/kg/h | INTRAVENOUS | Status: DC

## 2022-01-01 MED ORDER — SODIUM CHLORIDE 0.9 % IV SOLN
2.0000 g | Freq: Once | INTRAVENOUS | Status: AC
  Administered 2022-01-01: 2 g via INTRAVENOUS
  Filled 2022-01-01: qty 12.5

## 2022-01-01 MED ORDER — SODIUM CHLORIDE 0.9 % IV SOLN: 1.0000 g | INTRAVENOUS | Status: DC

## 2022-01-01 MED ORDER — ACETAMINOPHEN 325 MG PO TABS
650.0000 mg | ORAL_TABLET | Freq: Four times a day (QID) | ORAL | Status: DC | PRN
  Administered 2022-01-01 – 2022-01-03 (×6): 650 mg via ORAL
  Filled 2022-01-01 (×6): qty 2

## 2022-01-01 MED ORDER — SODIUM CHLORIDE 0.9% FLUSH
3.0000 mL | Freq: Two times a day (BID) | INTRAVENOUS | Status: DC
  Administered 2022-01-01 – 2022-01-06 (×10): 3 mL via INTRAVENOUS

## 2022-01-01 MED ORDER — GABAPENTIN 400 MG PO CAPS
800.0000 mg | ORAL_CAPSULE | Freq: Three times a day (TID) | ORAL | Status: DC
  Administered 2022-01-01 – 2022-01-06 (×15): 800 mg via ORAL
  Filled 2022-01-01 (×15): qty 2

## 2022-01-01 MED ORDER — LACTATED RINGERS IV SOLN: INTRAVENOUS | Status: DC

## 2022-01-01 MED ORDER — HYDRALAZINE HCL 20 MG/ML IJ SOLN
5.0000 mg | INTRAMUSCULAR | Status: DC | PRN
  Administered 2022-01-04: 5 mg via INTRAVENOUS
  Filled 2022-01-01: qty 1

## 2022-01-01 MED ORDER — ALBUTEROL SULFATE (2.5 MG/3ML) 0.083% IN NEBU: 2.5000 mg | INHALATION_SOLUTION | RESPIRATORY_TRACT | Status: DC | PRN

## 2022-01-01 MED ORDER — CLONIDINE HCL 0.2 MG PO TABS
0.2000 mg | ORAL_TABLET | Freq: Three times a day (TID) | ORAL | Status: DC
  Administered 2022-01-01 (×2): 0.2 mg via ORAL
  Filled 2022-01-01 (×2): qty 1

## 2022-01-01 MED ORDER — VANCOMYCIN HCL IN DEXTROSE 1-5 GM/200ML-% IV SOLN
1000.0000 mg | Freq: Once | INTRAVENOUS | Status: DC
  Filled 2022-01-01: qty 200

## 2022-01-01 MED ORDER — OXYCODONE HCL 5 MG PO TABS
5.0000 mg | ORAL_TABLET | Freq: Four times a day (QID) | ORAL | Status: DC | PRN
  Administered 2022-01-01 – 2022-01-06 (×10): 5 mg via ORAL
  Filled 2022-01-01 (×10): qty 1

## 2022-01-01 MED ORDER — SODIUM CHLORIDE 0.9 % IV SOLN: INTRAVENOUS | Status: DC

## 2022-01-01 MED ORDER — ACETAMINOPHEN 650 MG RE SUPP: 650.0000 mg | Freq: Four times a day (QID) | RECTAL | Status: DC | PRN

## 2022-01-01 MED ORDER — METRONIDAZOLE 500 MG/100ML IV SOLN
500.0000 mg | Freq: Once | INTRAVENOUS | Status: AC
  Administered 2022-01-01: 500 mg via INTRAVENOUS
  Filled 2022-01-01: qty 100

## 2022-01-01 MED ORDER — VANCOMYCIN HCL 2000 MG/400ML IV SOLN
2000.0000 mg | Freq: Once | INTRAVENOUS | Status: AC
  Administered 2022-01-01: 2000 mg via INTRAVENOUS
  Filled 2022-01-01: qty 400

## 2022-01-01 MED ORDER — ALLOPURINOL 100 MG PO TABS
100.0000 mg | ORAL_TABLET | Freq: Every day | ORAL | Status: DC
  Administered 2022-01-01 – 2022-01-06 (×6): 100 mg via ORAL
  Filled 2022-01-01 (×6): qty 1

## 2022-01-01 MED ORDER — APIXABAN 5 MG PO TABS
5.0000 mg | ORAL_TABLET | Freq: Two times a day (BID) | ORAL | Status: DC
  Administered 2022-01-01 – 2022-01-06 (×11): 5 mg via ORAL
  Filled 2022-01-01 (×11): qty 1

## 2022-01-01 MED ORDER — ONDANSETRON HCL 4 MG PO TABS: 4.0000 mg | ORAL_TABLET | Freq: Four times a day (QID) | ORAL | Status: DC | PRN

## 2022-01-01 MED ORDER — MAGNESIUM OXIDE -MG SUPPLEMENT 400 (240 MG) MG PO TABS
400.0000 mg | ORAL_TABLET | ORAL | Status: DC
  Administered 2022-01-01 – 2022-01-05 (×3): 400 mg via ORAL
  Filled 2022-01-01 (×4): qty 1

## 2022-01-01 MED ORDER — VANCOMYCIN HCL 1500 MG/300ML IV SOLN: 1500.0000 mg | INTRAVENOUS | Status: DC

## 2022-01-01 MED ORDER — ONDANSETRON HCL 4 MG/2ML IJ SOLN
4.0000 mg | Freq: Four times a day (QID) | INTRAMUSCULAR | Status: DC | PRN
  Administered 2022-01-03 – 2022-01-06 (×2): 4 mg via INTRAVENOUS
  Filled 2022-01-01 (×2): qty 2

## 2022-01-01 MED ORDER — HYDRALAZINE HCL 50 MG PO TABS
50.0000 mg | ORAL_TABLET | Freq: Three times a day (TID) | ORAL | Status: DC
  Administered 2022-01-01: 50 mg via ORAL
  Filled 2022-01-01: qty 2
  Filled 2022-01-01: qty 1

## 2022-01-01 MED ORDER — SODIUM CHLORIDE 0.9 % IV SOLN
2.0000 g | Freq: Two times a day (BID) | INTRAVENOUS | Status: DC
  Administered 2022-01-01: 2 g via INTRAVENOUS
  Filled 2022-01-01: qty 12.5

## 2022-01-01 MED ORDER — GUAIFENESIN ER 600 MG PO TB12: 600.0000 mg | ORAL_TABLET | Freq: Two times a day (BID) | ORAL | Status: DC | PRN

## 2022-01-01 MED ORDER — BISACODYL 5 MG PO TBEC
5.0000 mg | DELAYED_RELEASE_TABLET | Freq: Every day | ORAL | Status: DC | PRN
  Administered 2022-01-02: 5 mg via ORAL
  Filled 2022-01-01: qty 1

## 2022-01-01 MED ORDER — SODIUM CHLORIDE 0.9 % IV SOLN
500.0000 mg | INTRAVENOUS | Status: DC
  Administered 2022-01-01: 500 mg via INTRAVENOUS
  Filled 2022-01-01: qty 5

## 2022-01-01 MED ORDER — DOCUSATE SODIUM 100 MG PO CAPS
100.0000 mg | ORAL_CAPSULE | Freq: Two times a day (BID) | ORAL | Status: DC
  Administered 2022-01-01 – 2022-01-06 (×8): 100 mg via ORAL
  Filled 2022-01-01 (×10): qty 1

## 2022-01-01 MED ORDER — MORPHINE SULFATE (PF) 2 MG/ML IV SOLN
2.0000 mg | INTRAVENOUS | Status: DC | PRN
  Administered 2022-01-02: 4 mg via INTRAVENOUS
  Administered 2022-01-02 – 2022-01-05 (×4): 2 mg via INTRAVENOUS
  Filled 2022-01-01: qty 2
  Filled 2022-01-01 (×4): qty 1

## 2022-01-01 MED ORDER — CARVEDILOL 25 MG PO TABS
25.0000 mg | ORAL_TABLET | Freq: Two times a day (BID) | ORAL | Status: DC
  Administered 2022-01-01: 25 mg via ORAL
  Filled 2022-01-01: qty 2

## 2022-01-01 MED ORDER — LACTATED RINGERS IV BOLUS (SEPSIS)
1000.0000 mL | Freq: Once | INTRAVENOUS | Status: AC
  Administered 2022-01-01: 1000 mL via INTRAVENOUS

## 2022-01-01 MED ORDER — ASPIRIN 81 MG PO TBEC
81.0000 mg | DELAYED_RELEASE_TABLET | Freq: Every day | ORAL | Status: DC
  Administered 2022-01-01 – 2022-01-06 (×6): 81 mg via ORAL
  Filled 2022-01-01 (×6): qty 1

## 2022-01-01 MED ORDER — PENICILLIN G POTASSIUM 20000000 UNITS IJ SOLR
4.0000 10*6.[IU] | Freq: Four times a day (QID) | INTRAVENOUS | Status: DC
  Administered 2022-01-01 – 2022-01-04 (×10): 4 10*6.[IU] via INTRAVENOUS
  Filled 2022-01-01 (×15): qty 4

## 2022-01-01 NOTE — Care Plan (Signed)
Attempted to get pt. For CT, unable to locate pt. Or RN, no bed or pt. In [place].

## 2022-01-01 NOTE — ED Provider Notes (Addendum)
[place] EMERGENCY DEPARTMENT Provider Note   CSN: [id number] Arrival date & time: 01/01/22  0037     History  Chief Complaint  Patient presents with   Weakness    [name] is a 61 y.o. male.   Weakness Associated symptoms: fever and shortness of breath    Patient is a 61 year old male history of hypertension, HF, gout, CKD, A-fib, PAD, aortic stenosis, cellulitis presenting to the emergency department for evaluation of fever, shortness of breath, leg pain.  History obtained by family and patient.  Family reports that patient has had fever since this morning.  He also complained of left leg pain and right leg swelling and difficulty walking earlier today. His legs also started to shake when he was lying in bed. He had 1 episode of urinary incontinence in route.  States he took 1 Tylenol this morning for fever and all his home meds before EMS arrived.  Denies chest pain, nausea, vomiting, bowel changes.    Home Medications Prior to Admission medications   Medication Sig Start Date End Date Taking? Authorizing Provider  acetaminophen (TYLENOL) 500 MG tablet Take 500-1,000 mg by mouth every 6 (six) hours as needed for moderate pain.    [provider]  allopurinol (ZYLOPRIM) 100 MG tablet TAKE 1 TABLET (100 MG TOTAL) BY MOUTH DAILY. 09/30/21 09/30/22  [name], MD  apixaban (ELIQUIS) 5 MG TABS tablet Take 1 tablet (5 mg total) by mouth 2 (two) times daily. 11/08/21   [name], MD  aspirin EC 81 MG tablet Take 81 mg by mouth daily. Swallow whole.    [provider]  atorvastatin (LIPITOR) 40 MG tablet Take 1 tablet (40 mg total) by mouth daily. 09/20/21   [name], MD  carvedilol (COREG) 25 MG tablet Take 1 tablet (25 mg total) by mouth 2 (two) times daily with a meal. 08/23/21 12/23/21  [name], MD  cloNIDine (CATAPRES) 0.2 MG tablet Take 1 tablet (0.2 mg total) by mouth 3 (three) times daily. 11/08/21   [name], MD  dapagliflozin propanediol (FARXIGA) 10 MG TABS tablet Take 1 tablet (10 mg total) by mouth every morning. 11/09/21   Donato Heinz, MD  furosemide (LASIX) 40 MG tablet Take 1 tablet (40 mg total) by mouth 3 (three) times daily. 09/01/21   [facility], Tami Lin, PA  gabapentin (NEURONTIN) 400 MG capsule TAKE 2 CAPSULES(800 MG) BY MOUTH THREE TIMES DAILY 11/08/21   [name], MD  hydrALAZINE (APRESOLINE) 50 MG tablet Take 50 mg by mouth 3 (three) times daily. 11/24/21   [provider]  magnesium oxide (MAG-OX) 400 MG tablet Take 1 tablet (400 mg total) by mouth every other day. 11/09/21   Donato Heinz, MD  olmesartan (BENICAR) 40 MG tablet Take 1 tablet (40 mg total) by mouth daily. 12/07/21   Donato Heinz, MD  omeprazole (PRILOSEC) 40 MG capsule Take 1 capsule (40 mg total) by mouth daily. 12/07/21   Donato Heinz, MD  potassium chloride SA (KLOR-CON M) 20 MEQ tablet Take 10 mEq by mouth 2 (two) times a week. 11/08/21   [provider]  spironolactone (ALDACTONE) 50 MG tablet Take 1 tablet (50 mg total) by mouth daily. 11/08/21 11/03/22  [name], MD      Allergies    Adhesive [tape] and Latex    Review of Systems   Review of Systems  Constitutional:  Positive for fever.  Respiratory:  Positive for shortness of breath.   Musculoskeletal:        R leg  swelling  Neurological:  Positive for weakness.    Physical Exam Updated Vital Signs BP 123/87 (BP Location: Right Arm)   Pulse (!) 102   Temp 99.9 F (37.7 C) (Oral)   Resp 18   SpO2 97%  Physical Exam Vitals and nursing note reviewed.  Constitutional:      Appearance: He is ill-appearing.  HENT:     Head: Normocephalic and atraumatic.     Mouth/Throat:     Mouth: Mucous membranes are moist.  Eyes:     General: No scleral icterus. Cardiovascular:     Rate and Rhythm: Normal rate and regular rhythm.     Pulses: Normal pulses.     Heart sounds: Normal  heart sounds.  Pulmonary:     Effort: Pulmonary effort is normal.     Breath sounds: Rales present.  Abdominal:     General: Abdomen is flat.     Palpations: Abdomen is soft.     Tenderness: There is no abdominal tenderness.  Musculoskeletal:        General: Swelling (R leg) and tenderness (R leg) present. No deformity.  Skin:    General: Skin is warm.     Findings: No rash.  Neurological:     General: No focal deficit present.     Mental Status: He is alert.  Psychiatric:        Mood and Affect: Mood normal.     ED Results / Procedures / Treatments   Labs (all labs ordered are listed, but only abnormal results are displayed) Labs Reviewed  COMPREHENSIVE METABOLIC PANEL - Abnormal; Notable for the following components:      Result Value   Sodium 134 (*)    CO2 20 (*)    BUN 47 (*)    Creatinine, Ser 2.42 (*)    Total Protein 11.3 (*)    GFR, Estimated 29 (*)    All other components within normal limits  CBC WITH DIFFERENTIAL/PLATELET - Abnormal; Notable for the following components:   WBC 19.9 (*)    Hemoglobin 12.1 (*)    HCT 37.3 (*)    MCV 73.1 (*)    MCH 23.7 (*)    Neutro Abs 17.5 (*)    Monocytes Absolute 1.2 (*)    Abs Immature Granulocytes 0.19 (*)    All other components within normal limits  PROTIME-INR - Abnormal; Notable for the following components:   Prothrombin Time 20.2 (*)    INR 1.7 (*)    All other components within normal limits  CBG MONITORING, ED - Abnormal; Notable for the following components:   Glucose-Capillary 104 (*)    All other components within normal limits  SARS CORONAVIRUS 2 BY RT PCR  CULTURE, BLOOD (ROUTINE X 2)  CULTURE, BLOOD (ROUTINE X 2)  LACTIC ACID, PLASMA  URINALYSIS, ROUTINE W REFLEX MICROSCOPIC    EKG EKG Interpretation  Date/Time:  Saturday January 01 2022 00:57:43 EDT Ventricular Rate:  112 PR Interval:    QRS Duration: 90 QT Interval:  320 QTC Calculation: 436 R Axis:   -39 Text  Interpretation: Atrial fibrillation with rapid ventricular response Confirmed by Dory Horn) on 01/01/2022 1:38:05 AM  Radiology CT HEAD WO CONTRAST (5MM)  Result Date: 01/01/2022 CLINICAL DATA:  Mental status change. EXAM: CT HEAD WITHOUT CONTRAST TECHNIQUE: Contiguous axial images were obtained from the base of the skull through the vertex  without intravenous contrast. RADIATION DOSE REDUCTION: This exam was performed according to the departmental dose-optimization program which includes automated exposure control, adjustment of the mA and/or kV according to patient size and/or use of iterative reconstruction technique. COMPARISON:  04/20/2015 FINDINGS: Brain: No evidence of acute infarction, hemorrhage, hydrocephalus, extra-axial collection or mass lesion/mass effect. There is mild diffuse low-attenuation within the subcortical and periventricular white matter compatible with chronic microvascular disease. Vascular: No hyperdense vessel or unexpected calcification. Skull: Normal. Negative for fracture or focal lesion. Sinuses/Orbits: Paranasal sinuses and mastoid air cells are clear. Other: None. IMPRESSION: 1. No acute intracranial abnormalities. 2. Chronic microvascular disease Electronically Signed   By: Kerby Moors M.D.   On: 01/01/2022 05:14   DG CHEST PORT 1 VIEW  Result Date: 01/01/2022 CLINICAL DATA:  Weakness and confusion. EXAM: PORTABLE CHEST 1 VIEW COMPARISON:  06/18/2021. FINDINGS: Heart is enlarged and the mediastinal contour stable. Atherosclerotic calcification of the aorta is noted. Lung volumes are low resulting in vascular crowding. Mild airspace disease is present at the left lung base. No effusion or pneumothorax. No acute osseous abnormality. IMPRESSION: 1. Cardiomegaly. 2. Low lung volumes with patchy airspace disease at the left lung base, possible atelectasis or infiltrate. Electronically Signed   By: Brett Fairy M.D.   On: 01/01/2022 01:14     Procedures .Critical Care  Performed by: Rex Kras, PA Authorized by: Veatrice Kells, MD   Critical care provider statement:    Critical care time (minutes):  60   Critical care was necessary to treat or prevent imminent or life-threatening deterioration of the following conditions:  Renal failure and sepsis   Critical care was time spent personally by me on the following activities:  Development of treatment plan with patient or surrogate, discussions with consultants, evaluation of patient's response to treatment, examination of patient, ordering and review of laboratory studies, ordering and review of radiographic studies, ordering and performing treatments and interventions, pulse oximetry, re-evaluation of patient's condition and review of old charts   I assumed direction of critical care for this patient from another provider in my specialty: yes     Care discussed with: admitting provider       Medications Ordered in ED Medications  lactated ringers infusion ( Intravenous Infusion Verify 01/01/22 0516)  lactated ringers bolus 1,000 mL (0 mLs Intravenous Stopped 01/01/22 0351)  ceFEPIme (MAXIPIME) 2 g in sodium chloride 0.9 % 100 mL IVPB (0 g Intravenous Stopped 01/01/22 0328)  metroNIDAZOLE (FLAGYL) IVPB 500 mg (0 mg Intravenous Stopped 01/01/22 0328)  vancomycin (VANCOREADY) IVPB 2000 mg/400 mL (0 mg Intravenous Stopped 01/01/22 0515)  acetaminophen (TYLENOL) tablet 1,000 mg (1,000 mg Oral Given 01/01/22 0228)    ED Course/ Medical Decision Making/ A&P                           Medical Decision Making Amount and/or Complexity of Data Reviewed Radiology: ordered.  Risk OTC drugs. Prescription drug management. Decision regarding hospitalization.   This patient presents to the ED for concern shortness of breath, leg pain, this involves an extensive number of treatment options, and is a complaint that carries with it a high risk of complications and morbidity.  The differential  diagnosis includes sepsis, PE, AKI, DVT, pneumonia HF exacerbation, CVA.   Co morbidities that complicate the patient evaluation  See HPI   Additional history obtained:  Additional history obtained from EMR External records from outside source obtained and reviewed including Care  Everywhere/External Records and Primary Care Documents  Lab Tests:  I Ordered, and personally interpreted labs.  The pertinent results include: White blood count 19.9.  GFR of 29, creatinine 2.42, COVID-negative, lactic acid negative otherwise unremarkable.  Awaiting blood culture.   Imaging Studies ordered:  I ordered imaging studies including chest x-Saiz I independently visualized and interpreted imaging which showed  Low lung volumes with patchy airspace disease at the left lung base, possible atelectasis or infiltrate. I agree with the radiologist interpretation   Cardiac Monitoring: / EKG:  The patient was maintained on a cardiac monitor.  I personally viewed and interpreted the cardiac monitored which showed an underlying rhythm of: sinus rhythm   Consultations Obtained:  I requested consultation with the hospitalist,  and discussed lab and imaging findings as well as pertinent plan - they recommend: Admission   Problem List / ED Course / Critical interventions / Medication management  Sepsis, AKI,  Vitals signs significant for temp 101.5, heart rate 107.Marland Kitchen Otherwise within normal range and stable throughout visit. Laboratory/imaging studies significant for: See above On physical examination, patient is ill-appearing, febrile 101.5, heart rate 107.  Chest x-[place] showed infiltrates.  CBC with white count 19.9.  Code sepsis activated.  Patient is in AKI with creatinine 2.4, eGFR 29.  Cannot rule out PE however cannot proceed with CTA angio due to AKI.  Hospitalist consulted and patient will be admitted for V/Q in the morning.  Patient took Eliquis an hour before arrival so heparin on hold. I  ordered medication including tylenol for fever Reevaluation of the patient after these medicines showed that the patient improved I have reviewed the patients home medicines and have made adjustments as needed    Social Determinants of Health:  na   Test / Admission - Considered:  Patient is admitted. VQ study expected in the morning.        Final Clinical Impression(s) / ED Diagnoses Final diagnoses:  Sepsis, due to unspecified organism, unspecified whether acute organ dysfunction present [facility])  AKI (acute kidney injury) [facility])    Rx / Friona Orders ED Discharge Orders     None         Rex Kras, [place] 01/01/22 0610    Rex Kras, PA 01/01/22 [place], April, MD 01/01/22 3536
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
## 2022-01-01 NOTE — Progress Notes (Signed)
Pharmacy Antibiotic Note  [name] is a 61 y.o. male admitted on 01/01/2022 with SOB and fever, possible PNA/sepsis.  Pharmacy has been consulted for Vancomycin and Cefepime  dosing.  Vancomycin 2 g IV given in ED at 0230  Plan: Vancomycin 1500 mg IV q48h Cefepime 2 g IV q12h     Temp (24hrs), Avg:100.5 F (38.1 C), Min:99.9 F (37.7 C), Max:101.5 F (38.6 C)  Recent Labs  Lab 01/01/22 0059  WBC 19.9*  CREATININE 2.42*  LATICACIDVEN 1.4    Estimated Creatinine Clearance: 36.3 mL/min (A) (by C-G formula based on SCr of 2.42 mg/dL (H)).    Allergies  Allergen Reactions   Adhesive [Tape] Other (See Comments)    Makes the skin feel as if it is burning, will also bruise the skin. Pt. prefers paper tape   Latex Hives, Itching and Other (See Comments)    Burns skin, also      Caryl Pina 01/01/2022 7:24 AM

## 2022-01-01 NOTE — Progress Notes (Addendum)
PHARMACY - PHYSICIAN COMMUNICATION CRITICAL VALUE ALERT - BLOOD CULTURE IDENTIFICATION (BCID)  [name] is an 61 y.o. male who presented to [facility] on 01/01/2022 with a chief complaint of weakness/shaking  Assessment:  Pt with strep pyogenes (suspected source now RLE cellulitis)  Name of physician (or Provider) Contacted: Dr. Alcario Drought  Current antibiotics: Rocephin and Azithromycin  Changes to prescribed antibiotics recommended:  Narrow antibiotics to PCN G 4 million units q6h  Results for orders placed or performed during the hospital encounter of 01/01/22  Blood Culture ID Panel (Reflexed) (Collected: 01/01/2022 12:45 AM)  Result Value Ref Range   Enterococcus faecalis NOT DETECTED NOT DETECTED   Enterococcus Faecium NOT DETECTED NOT DETECTED   Listeria monocytogenes NOT DETECTED NOT DETECTED   Staphylococcus species NOT DETECTED NOT DETECTED   Staphylococcus aureus (BCID) NOT DETECTED NOT DETECTED   Staphylococcus epidermidis NOT DETECTED NOT DETECTED   Staphylococcus lugdunensis NOT DETECTED NOT DETECTED   Streptococcus species DETECTED (A) NOT DETECTED   Streptococcus agalactiae NOT DETECTED NOT DETECTED   Streptococcus pneumoniae NOT DETECTED NOT DETECTED   Streptococcus pyogenes DETECTED (A) NOT DETECTED   A.calcoaceticus-baumannii NOT DETECTED NOT DETECTED   Bacteroides fragilis NOT DETECTED NOT DETECTED   Enterobacterales NOT DETECTED NOT DETECTED   Enterobacter cloacae complex NOT DETECTED NOT DETECTED   Escherichia coli NOT DETECTED NOT DETECTED   Klebsiella aerogenes NOT DETECTED NOT DETECTED   Klebsiella oxytoca NOT DETECTED NOT DETECTED   Klebsiella pneumoniae NOT DETECTED NOT DETECTED   Proteus species NOT DETECTED NOT DETECTED   Salmonella species NOT DETECTED NOT DETECTED   Serratia marcescens NOT DETECTED NOT DETECTED   Haemophilus influenzae NOT DETECTED NOT DETECTED   Neisseria meningitidis NOT DETECTED NOT DETECTED   Pseudomonas aeruginosa NOT DETECTED  NOT DETECTED   Stenotrophomonas maltophilia NOT DETECTED NOT DETECTED   Candida albicans NOT DETECTED NOT DETECTED   Candida auris NOT DETECTED NOT DETECTED   Candida glabrata NOT DETECTED NOT DETECTED   Candida krusei NOT DETECTED NOT DETECTED   Candida parapsilosis NOT DETECTED NOT DETECTED   Candida tropicalis NOT DETECTED NOT DETECTED   Cryptococcus neoformans/gattii NOT DETECTED NOT DETECTED    Sherlon Handing, PharmD, BCPS Please see amion for complete clinical pharmacist phone list 01/01/2022  7:07 PM

## 2022-01-01 NOTE — ED Notes (Addendum)
Pt brought back to bed, fought to be extremely lethargic, alert to pain and incontinent of urine.

## 2022-01-01 NOTE — Progress Notes (Signed)
Pt being followed by [facility] for Sepsis protocol. 

## 2022-01-01 NOTE — ED Notes (Signed)
Patient transported to [facility] 

## 2022-01-01 NOTE — Progress Notes (Signed)
[facility] night coverage note:  Called by [facility]: BCx from last night growing S.Pyo.  ABx changed per their protocol.  Evaluated pt in ED:  Pt now more awake than for Dr. Lorin Mercy' exam.  Pt able to tell me he has had a couple of days of RLE pain in knee and below knee as well as swelling in that area.  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  Pain with ROM and swelling of knee, but he says his knee swelling usually gets like this when he gets cellulitis in the past.  On Exam: has BLE venous stasis skin changes.  But has erythema below knee of RLE.  Edema below knee and to the knee of RLE which he says is typical when he gets cellulitis in that leg.  No crepitus on palpation, no pain beyond site of erythema / edema, no pain out of proportion to exam findings.  Assessment: Sepsis and S.Pyo bacteremia secondary to cellulitis of RLE.  Septic arthritis possible but less likely, NSTI unlikely from exam findings / clinical course thus far.  P: 1) ABx narrowed by [facility] to PCN for S.Pyo coverage per protocol given positive BCx 2) getting plain film X rays of leg and knee to make sure no evidence of subq air.

## 2022-01-01 NOTE — ED Notes (Signed)
Admitting provider at bedside.

## 2022-01-01 NOTE — ED Triage Notes (Signed)
Pt c/o "shaking" and reports he thinks he is having a stroke. Pt unable to state what time symptoms started. Weakness to bilateral upper and lower extremities. No facial droop noted. Wife at bedside, states he appears confused and urinated on himself earlier in the night. Pt febrile, wife states she gave him 1g tylenol pta.

## 2022-01-02 ENCOUNTER — Encounter (HOSPITAL_COMMUNITY): Payer: Self-pay | Admitting: Internal Medicine

## 2022-01-02 ENCOUNTER — Inpatient Hospital Stay (HOSPITAL_COMMUNITY): Payer: Medicare Other

## 2022-01-02 DIAGNOSIS — N179 Acute kidney failure, unspecified: Secondary | ICD-10-CM | POA: Diagnosis not present

## 2022-01-02 DIAGNOSIS — I130 Hypertensive heart and chronic kidney disease with heart failure and stage 1 through stage 4 chronic kidney disease, or unspecified chronic kidney disease: Secondary | ICD-10-CM

## 2022-01-02 DIAGNOSIS — A419 Sepsis, unspecified organism: Secondary | ICD-10-CM | POA: Diagnosis not present

## 2022-01-02 DIAGNOSIS — I5032 Chronic diastolic (congestive) heart failure: Secondary | ICD-10-CM | POA: Diagnosis not present

## 2022-01-02 DIAGNOSIS — N1832 Chronic kidney disease, stage 3b: Secondary | ICD-10-CM

## 2022-01-02 DIAGNOSIS — I4819 Other persistent atrial fibrillation: Secondary | ICD-10-CM

## 2022-01-02 LAB — SYNOVIAL CELL COUNT + DIFF, W/ CRYSTALS
Eosinophils-Synovial: 0 % (ref 0–1)
Lymphocytes-Synovial Fld: 4 % (ref 0–20)
Monocyte-Macrophage-Synovial Fluid: 6 % — ABNORMAL LOW (ref 50–90)
Neutrophil, Synovial: 90 % — ABNORMAL HIGH (ref 0–25)
WBC, Synovial: 46200 /mm3 — ABNORMAL HIGH (ref 0–200)

## 2022-01-02 LAB — BASIC METABOLIC PANEL
Anion gap: 9 (ref 5–15)
BUN: 34 mg/dL — ABNORMAL HIGH (ref 8–23)
CO2: 21 mmol/L — ABNORMAL LOW (ref 22–32)
Calcium: 9 mg/dL (ref 8.9–10.3)
Chloride: 106 mmol/L (ref 98–111)
Creatinine, Ser: 2.09 mg/dL — ABNORMAL HIGH (ref 0.61–1.24)
GFR, Estimated: 35 mL/min — ABNORMAL LOW (ref >60)
Glucose, Bld: 98 mg/dL (ref 70–99)
Potassium: 4.1 mmol/L (ref 3.5–5.1)
Sodium: 136 mmol/L (ref 135–145)

## 2022-01-02 LAB — GRAM STAIN

## 2022-01-02 LAB — CBC
HCT: 31.6 % — ABNORMAL LOW (ref 39.0–52.0)
Hemoglobin: 10.4 g/dL — ABNORMAL LOW (ref 13.0–17.0)
MCH: 23.7 pg — ABNORMAL LOW (ref 26.0–34.0)
MCHC: 32.9 g/dL (ref 30.0–36.0)
MCV: 72.1 fL — ABNORMAL LOW (ref 80.0–100.0)
Platelets: 120 10*3/uL — ABNORMAL LOW (ref 150–400)
RBC: 4.38 MIL/uL (ref 4.22–5.81)
RDW: 14.6 % (ref 11.5–15.5)
WBC: 13.3 10*3/uL — ABNORMAL HIGH (ref 4.0–10.5)
nRBC: 0 % (ref 0.0–0.2)

## 2022-01-02 LAB — GLUCOSE, CAPILLARY: Glucose-Capillary: 110 mg/dL — ABNORMAL HIGH (ref 70–99)

## 2022-01-02 MED ORDER — SODIUM CHLORIDE 0.9 % IV BOLUS
500.0000 mL | Freq: Once | INTRAVENOUS | Status: AC
  Administered 2022-01-02: 500 mL via INTRAVENOUS

## 2022-01-02 MED ORDER — COLCHICINE 0.6 MG PO TABS
0.6000 mg | ORAL_TABLET | Freq: Once | ORAL | Status: AC
  Administered 2022-01-02: 0.6 mg via ORAL
  Filled 2022-01-02: qty 1

## 2022-01-02 MED ORDER — METHOCARBAMOL 1000 MG/10ML IJ SOLN
500.0000 mg | Freq: Four times a day (QID) | INTRAVENOUS | Status: DC | PRN
  Administered 2022-01-03: 500 mg via INTRAVENOUS
  Filled 2022-01-02: qty 5

## 2022-01-02 MED ORDER — COLCHICINE 0.6 MG PO TABS
1.2000 mg | ORAL_TABLET | Freq: Once | ORAL | Status: AC
  Administered 2022-01-02: 1.2 mg via ORAL
  Filled 2022-01-02: qty 2

## 2022-01-02 MED ORDER — METHOCARBAMOL 500 MG PO TABS
500.0000 mg | ORAL_TABLET | Freq: Four times a day (QID) | ORAL | Status: DC | PRN
  Administered 2022-01-02 – 2022-01-05 (×3): 500 mg via ORAL
  Filled 2022-01-02 (×3): qty 1

## 2022-01-02 MED ORDER — LINEZOLID 600 MG PO TABS
600.0000 mg | ORAL_TABLET | Freq: Two times a day (BID) | ORAL | Status: AC
  Administered 2022-01-02 – 2022-01-04 (×6): 600 mg via ORAL
  Filled 2022-01-02 (×7): qty 1

## 2022-01-02 MED ORDER — LIDOCAINE HCL (PF) 1 % IJ SOLN
INTRAMUSCULAR | Status: AC
  Filled 2022-01-02: qty 5

## 2022-01-02 NOTE — Progress Notes (Signed)
Synovial Fluid analysis thus far shows the presence of gout crystals in the R knee aspirate. WBCs are elevated to 46,000 which is high for a native joint but gout can cause this.   Will continue to monitor for culture and gram stain results. However, leaning more towards acute gout flare vs septic joint especially in the setting of a history of gout and on daily allopurinol.   No need to take urgently to the OR right now so  I will discontinue the NPO order and return him to his previous cardiac diet.   I have ordered 1.2mg  of Colchicine to be given now followed 1 hour later by an additional 0.6mg  of Colchicine for a total of 1.8mg  which is the recommended treatment dose for an acute gout flare. Would also recommend adding Prednisone 30-40mg /day x 5 days if primary team thinks reasonable and patient able to tolerate it.    Atha Muradyan PA-C<B R>[phone]

## 2022-01-02 NOTE — Progress Notes (Signed)
Tylenol and pain meds given. Stable. Plan of care ongoing.  01/01/22 2111  Assess: MEWS Score  Temp (!) 102.7 F (39.3 C)  BP (!) 131/97  MAP (mmHg) 109  Pulse Rate 98  ECG Heart Rate (!) 105  Resp (!) 21  Level of Consciousness Alert  SpO2 96 %  O2 Device Room Air  Assess: MEWS Score  MEWS Temp 2  MEWS Systolic 0  MEWS Pulse 1  MEWS RR 1  MEWS LOC 0  MEWS Score 4  MEWS Score Color Red  Assess: if the MEWS score is Yellow or Red  Were vital signs taken at a resting state? Yes  Focused Assessment No change from prior assessment  Does the patient meet 2 or more of the SIRS criteria? Yes  Does the patient have a confirmed or suspected source of infection? Yes  Provider and Rapid Response Notified? Yes  MEWS guidelines implemented *See Row Information* Yes  Treat  MEWS Interventions Administered prn meds/treatments;Escalated (See documentation below)  Pain Score 10  Pain Type Acute pain  Pain Location Knee  Pain Orientation Right  Take Vital Signs  Increase Vital Sign Frequency  Red: Q 1hr X 4 then Q 4hr X 4, if remains red, continue Q 4hrs  Escalate  MEWS: Escalate Red: discuss with charge nurse/RN and provider, consider discussing with RRT  Notify: Charge Nurse/RN  Name of Charge Nurse/RN Notified Fred RN  Date Charge Nurse/RN Notified 01/01/22  Time Charge Nurse/RN Notified 2138  Notify: Provider  Provider Name/Title Dr Alcario Drought  Date Provider Notified 01/01/22  Time Provider Notified 2130  Method of Notification Page  Provider response See new orders  Date of Provider Response 01/01/22  Time of Provider Response 2139  Document  Patient Outcome Stabilized after interventions  Progress note created (see row info) Yes  Assess: SIRS CRITERIA  SIRS Temperature  1  SIRS Pulse 1  SIRS Respirations  1  SIRS WBC 1  SIRS Score Sum  4

## 2022-01-02 NOTE — Evaluation (Signed)
Physical Therapy Evaluation Patient Details Name: [name] MRN: [id number] DOB: May 11, 1960 Today's Date: 01/02/2022  History of Present Illness  Pt is a 61 year old man admitted on 01/01/22 with fever, SOB, LE pain and shaking with difficulty walking due to sepsis and PNA. PMH: HTN, CHF, HLD, gout, CKD, afib, PAD, aortic stenosis, cellulitis, chronic back pain.  Clinical Impression  Pt admitted secondary to problem above with deficits below. Pt with limited tolerance this session secondary to R knee pain. Requiring max A for rolling this session. Anticipate pt will progress well once pain controlled, but will need to progress mobility further to determine most appropriate d/c recommendation. Will continue to follow acutely.        Recommendations for follow up therapy are one component of a multi-disciplinary discharge planning process, led by the attending physician.  Recommendations may be updated based on patient status, additional functional criteria and insurance authorization.  Follow Up Recommendations Other (comment) (TBD pending progression)      Assistance Recommended at Discharge Frequent or constant Supervision/Assistance  Patient can return home with the following  A lot of help with walking and/or transfers;A lot of help with bathing/dressing/bathroom;Help with stairs or ramp for entrance;Assist for transportation;Assistance with cooking/housework    Equipment Recommendations Other (comment) (TBD pending progression)  Recommendations for Other Services       Functional Status Assessment Patient has had a recent decline in their functional status and demonstrates the ability to make significant improvements in function in a reasonable and predictable amount of time.     Precautions / Restrictions Precautions Precautions: Fall Restrictions Weight Bearing Restrictions: No      Mobility  Bed Mobility Overal bed mobility: Needs Assistance Bed Mobility:  Rolling Rolling: Max assist         General bed mobility comments: use of rail, increased time, assist to change wet linen and gown, RN made aware primofit was not fitting well. Unable to tolerate further mobility    Transfers                   General transfer comment: NT, unable to tolerate due to R knee pain    Ambulation/Gait                  Stairs            Wheelchair Mobility    Modified Rankin (Stroke Patients Only)       Balance                                             Pertinent Vitals/Pain Pain Assessment Pain Assessment: Faces Faces Pain Scale: Hurts whole lot Pain Location: R knee Pain Descriptors / Indicators: Aching, Grimacing, Guarding Pain Intervention(s): Monitored during session, Limited activity within patient's tolerance, Repositioned    Home Living Family/patient expects to be discharged to:: Private residence Living Arrangements: Spouse/significant other;Children (11, 8, 7) Available Help at Discharge: Family;Available 24 hours/day Type of Home: House Home Access: Stairs to enter Entrance Stairs-Rails: Left Entrance Stairs-Number of Steps: 3   Home Layout: One level Home Equipment: None      Prior Function Prior Level of Function : Independent/Modified Independent                     Hand Dominance   Dominant Hand: Right    Extremity/Trunk Assessment  Upper Extremity Assessment Upper Extremity Assessment: Defer to OT evaluation    Lower Extremity Assessment Lower Extremity Assessment: RLE deficits/detail RLE Deficits / Details: Very limited ROM at knee. Increased swelling and warmth. Able to perform limited ankle pump       Communication   Communication: No difficulties  Cognition Arousal/Alertness: Awake/alert Behavior During Therapy: WFL for tasks assessed/performed Overall Cognitive Status: Within Functional Limits for tasks assessed                                           General Comments      Exercises     Assessment/Plan    PT Assessment Patient needs continued PT services  PT Problem List Decreased strength;Decreased balance;Decreased activity tolerance;Decreased mobility;Pain       PT Treatment Interventions DME instruction;Gait training;Stair training;Functional mobility training;Therapeutic activities;Therapeutic exercise;Balance training;Patient/family education    PT Goals (Current goals can be found in the Care Plan section)  Acute Rehab PT Goals Patient Stated Goal: to decrease pain PT Goal Formulation: With patient Time For Goal Achievement: 01/16/22 Potential to Achieve Goals: Good    Frequency Min 3X/week     Co-evaluation PT/OT/SLP Co-Evaluation/Treatment: Yes Reason for Co-Treatment: To address functional/ADL transfers;For patient/therapist safety PT goals addressed during session: Balance;Mobility/safety with mobility OT goals addressed during session: ADL's and self-care       AM-PAC PT "6 Clicks" Mobility  Outcome Measure Help needed turning from your back to your side while in a flat bed without using bedrails?: A Lot Help needed moving from lying on your back to sitting on the side of a flat bed without using bedrails?: A Lot Help needed moving to and from a bed to a chair (including a wheelchair)?: Total Help needed standing up from a chair using your arms (e.g., wheelchair or bedside chair)?: Total Help needed to walk in hospital room?: Total Help needed climbing 3-5 steps with a railing? : Total 6 Click Score: 8    End of Session   Activity Tolerance: Patient limited by pain Patient left: in bed;with call bell/phone within reach;with bed alarm set Nurse Communication: Mobility status PT Visit Diagnosis: Unsteadiness on feet (R26.81);Muscle weakness (generalized) (M62.81);Difficulty in walking, not elsewhere classified (R26.2)    Time: 1116-1140 PT Time Calculation (min) (ACUTE  ONLY): 24 min   Charges:   PT Evaluation $PT Eval Moderate Complexity: 1 Mod           [name], PT, DPT  Acute Rehabilitation Services  Office: [phone]   Rudean Hitt 01/02/2022, 3:00 PM

## 2022-01-02 NOTE — Progress Notes (Signed)
MD made ware.  01/02/22 2018  Vitals  Temp (!) 103.1 F (39.5 C)  Temp Source Oral  BP (!) 181/105  MAP (mmHg) 126  BP Location Left Arm  BP Method Automatic  Patient Position (if appropriate) Lying  Pulse Rate (!) 122  Pulse Rate Source Monitor  ECG Heart Rate (!) 135  Resp (!) 21  MEWS COLOR  MEWS Score Color Red  Oxygen Therapy  SpO2 94 %  O2 Device Room Air  Pain Assessment  Pain Scale 0-10  Pain Score 8  Pain Type Acute pain  Pain Location Knee  Pain Orientation Right  Pain Intervention(s) Medication (See eMAR)  PAINAD (Pain Assessment in Advanced Dementia)  Breathing 0  Negative Vocalization 0  PCA/Epidural/Spinal Assessment  Respiratory Pattern Regular;Unlabored  Glasgow Coma Scale  Eye Opening 4  Best Verbal Response (NON-intubated) 5  Best Motor Response 6  Glasgow Coma Scale Score 15  MEWS Score  MEWS Temp 2  MEWS Systolic 0  MEWS Pulse 3  MEWS RR 1  MEWS LOC 0  MEWS Score 6  Provider Notification  Provider Name/Title Dr Alcario Drought  Date Provider Notified 01/02/22  Time Provider Notified 2028  Method of Notification Page

## 2022-01-02 NOTE — Evaluation (Signed)
Occupational Therapy Evaluation Patient Details Name: [name] MRN: [id number] DOB: 10-24-1960 Today's Date: 01/02/2022   History of Present Illness Pt is a 61 year old man admitted on 01/01/22 with fever, SOB, LE pain and shaking with difficulty walking due to sepsis and PNA. PMH: HTN, CHF, HLD, gout, CKD, afib, PAD, aortic stenosis, cellulitis, chronic back pain.   Clinical Impression   Pt is typically independent. Limited evaluation today due to increased R knee pain. Pt rolled with max assist for linen change. He needs set up to total assist for ADLs. Pt likely to progress well once knee pain improves and he can mobilize. Will determine discharge recommendations as pt progresses.     Recommendations for follow up therapy are one component of a multi-disciplinary discharge planning process, led by the attending physician.  Recommendations may be updated based on patient status, additional functional criteria and insurance authorization.   Follow Up Recommendations  Other (comment) (To be determined)    Assistance Recommended at Discharge Frequent or constant Supervision/Assistance  Patient can return home with the following A lot of help with bathing/dressing/bathroom;Assistance with cooking/housework;Assist for transportation;Help with stairs or ramp for entrance;A lot of help with walking and/or transfers    Functional Status Assessment  Patient has had a recent decline in their functional status and demonstrates the ability to make significant improvements in function in a reasonable and predictable amount of time.  Equipment Recommendations  Other (comment) (TBD)    Recommendations for Other Services       Precautions / Restrictions Precautions Precautions: Fall      Mobility Bed Mobility Overal bed mobility: Needs Assistance Bed Mobility: Rolling Rolling: Max assist         General bed mobility comments: use of rail, increased time, assist to change wet linen and  gown, RN made aware primofit was not fitting well.    Transfers                   General transfer comment: NT, unable to tolerate due to R knee pain      Balance                                           ADL either performed or assessed with clinical judgement   ADL Overall ADL's : Needs assistance/impaired Eating/Feeding: Independent;Bed level   Grooming: Set up;Bed level   Upper Body Bathing: Moderate assistance;Bed level   Lower Body Bathing: Bed level;Total assistance   Upper Body Dressing : Minimal assistance;Bed level   Lower Body Dressing: Bed level;Total assistance       Toileting- Clothing Manipulation and Hygiene: Total assistance;Bed level               Vision Baseline Vision/History: 0 No visual deficits Ability to See in Adequate Light: 0 Adequate Patient Visual Report: No change from baseline       Perception     Praxis      Pertinent Vitals/Pain Pain Assessment Pain Assessment: Faces Faces Pain Scale: Hurts whole lot Pain Location: R knee Pain Descriptors / Indicators: Aching, Grimacing, Guarding Pain Intervention(s): Repositioned, Premedicated before session, Ice applied     Hand Dominance Right   Extremity/Trunk Assessment Upper Extremity Assessment Upper Extremity Assessment: Overall WFL for tasks assessed   Lower Extremity Assessment Lower Extremity Assessment: Defer to PT evaluation       Communication  Communication Communication: No difficulties   Cognition Arousal/Alertness: Awake/alert Behavior During Therapy: WFL for tasks assessed/performed Overall Cognitive Status: Within Functional Limits for tasks assessed                                       General Comments       Exercises     Shoulder Instructions      Home Living Family/patient expects to be discharged to:: Private residence Living Arrangements: Spouse/significant other;Children (11, 8, 7) Available Help at  Discharge: Family;Available 24 hours/day Type of Home: House Home Access: Stairs to enter [employer] of Steps: 3 Entrance Stairs-Rails: Left Home Layout: One level     Bathroom Shower/Tub: Chief Strategy Officer: Standard     Home Equipment: None          Prior Functioning/Environment Prior Level of Function : Independent/Modified Independent                        OT Problem List: Pain      OT Treatment/Interventions: Self-care/ADL training;DME and/or AE instruction;Therapeutic activities;Patient/family education;Balance training    OT Goals(Current goals can be found in the care plan section) Acute Rehab OT Goals OT Goal Formulation: With patient Time For Goal Achievement: 01/16/22 Potential to Achieve Goals: Good ADL Goals Pt Will Perform Grooming: with supervision;standing Pt Will Perform Lower Body Bathing: with min assist;sit to/from stand Pt Will Perform Lower Body Dressing: with min assist;sit to/from stand Pt Will Transfer to Toilet: with min assist;ambulating;bedside commode Pt Will Perform Toileting - Clothing Manipulation and hygiene: with min assist;sit to/from stand Additional ADL Goal #1: Pt will perform bed mobility with min assist in preparation for ADLs.  OT Frequency: Min 2X/week    Co-evaluation PT/OT/SLP Co-Evaluation/Treatment: Yes     OT goals addressed during session: ADL's and self-care      AM-PAC OT "6 Clicks" Daily Activity     Outcome Measure Help from another person eating meals?: None Help from another person taking care of personal grooming?: A Little Help from another person toileting, which includes using toliet, bedpan, or urinal?: Total Help from another person bathing (including washing, rinsing, drying)?: A Lot Help from another person to put on and taking off regular upper body clothing?: A Little Help from another person to put on and taking off regular lower body clothing?: Total 6 Click  Score: 14   End of Session Nurse Communication: Mobility status;Other (comment) (pain level)  Activity Tolerance: Patient limited by pain Patient left: in bed;with call bell/phone within reach;with bed alarm set  OT Visit Diagnosis: Pain                Time: 2353-6144 OT Time Calculation (min): 24 min Charges:  OT General Charges $OT Visit: 1 Visit OT Evaluation $OT Eval Moderate Complexity: 1 Mod  Berna Spare, OTR/L Acute Rehabilitation Services Office: [phone]   Evern Bio 01/02/2022, 1:31 PM
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
## 2022-01-02 NOTE — Progress Notes (Addendum)
PROGRESS NOTE    [name]  [id number] DOB: 1961-02-28 DOA: 01/01/2022 PCP: [name], MD  Outpatient Specialists:     Brief Narrative:  Patient is a 61 year old male with past medical history significant for chronic diastolic congestive heart failure, atrial fibrillation on Eliquis, chronic back pain, hypertension, hyperlipidemia and OSA not on CPAP.  Patient endorsed 3-day history of right knee swelling, pain, difficulty extending the right knee and chills.  Blood culture has grown strep pyogenes.  Infectious disease team is consulted.  Orthopedic team also consulted for possible aspiration and further analysis.  Patient is currently on penicillin G and Zyvox.   Assessment & Plan:   Principal Problem:   Sepsis due to group A Streptococcus with acute renal failure without septic shock (HCC) Active Problems:   HTN (hypertension)   Chronic diastolic heart failure (HCC)   Stage 3b chronic kidney disease (HCC)   OSA (obstructive sleep apnea)   Persistent atrial fibrillation (HCC)   CAP (community acquired pneumonia)   AKI (acute kidney injury) ([place])   Sepsis ([place])   Severe sepsis/bacteremia/right septic knee arthritis: -Input from the infectious disease team and orthopedic team is appreciated. -For right knee joint aspiration and further analysis. -Continue IV antibiotics (patient is currently on penicillin G and Zyvox). -Continue to manage supportively. -Further management will depend on hospital course.  Chronic diastolic congestive heart failure: -Stable. -[name] is on hold.  -Coreg is on hold due to low blood pressure. -Compensated.  Acute kidney injury on chronic kidney disease stage IIIa: -Baseline serum creatinine is around 1.4. -On presentation, serum creatinine was 2.42. -Serum creatinine has improved to 2.09. -Avoid nephrotoxins. -Treat underlying bacteremia/sepsis. -Current AKI is likely multifactorial, possible prerenal/ATN. -Continue to monitor  renal function and electrolytes. -Have a low threshold to consult nephrology team if renal function deteriorates. -Worsening of renal function may be an indication of severe sepsis.   Atrial fibrillation with RVR: -Heart rate is currently 118. -Optimize heart rate. -Suspect component of increased adrenergic drive. -Continue Eliquis 2.5 Mg p.o. twice daily. -Low threshold to resume beta-blockers when blood pressure permits. -Continue rate controlling medication, including beta-blockers when blood pressure permits.  Hyperlipidemia: -Continue atorvastatin.  OSA: -Not on CPAP.   DVT prophylaxis: Eliquis Code Status: Full code Family Communication:  Disposition Plan: This will depend on hospital course   Consultants:  Infectious disease Orthopedic  Procedures:  Patient may undergo arthrocentesis (rule out with orthopedic input)  Antimicrobials:  Penicillin G Zyvox   Subjective: -Patient continues to report chills and right knee pain/swelling.  Objective: Vitals:   01/02/22 0628 01/02/22 0700 01/02/22 0900 01/02/22 1100  BP:  115/74 103/76 110/77  Pulse: (!) 107 93 90 94  Resp: 20 17 19 20   Temp: (!) 100.8 F (38.2 C) 99.2 F (37.3 C)  99.1 F (37.3 C)  TempSrc:  Oral  Oral  SpO2:  94% 95% 98%  Weight:      Height:        Intake/Output Summary (Last 24 hours) at 01/02/2022 1542 Last data filed at 01/02/2022 1300 Gross per 24 hour  Intake 3359.79 ml  Output 800 ml  Net 2559.79 ml   Filed Weights   01/01/22 2100 01/02/22 0535  Weight: 95.3 kg 96.8 kg    Examination:  General exam: Appears calm and comfortable.  Patient is obese. Respiratory system: Clear to auscultation.  Cardiovascular system: S1 & S2, mildly tachycardic.   Gastrointestinal system: Abdomen is obese, soft and nontender.  Organs are difficult to  assess.   Central nervous system: Awake and alert.  Patient moves all extremities.   Extremities: Discoloration of both lower legs (dark  color), likely chronic.  Data Reviewed: I have personally reviewed following labs and imaging studies  CBC: Recent Labs  Lab 01/01/22 0059 01/02/22 0337  WBC 19.9* 13.3*  NEUTROABS 17.5*  --   HGB 12.1* 10.4*  HCT 37.3* 31.6*  MCV 73.1* 72.1*  PLT 156 123456*   Basic Metabolic Panel: Recent Labs  Lab 01/01/22 0059 01/02/22 0337  NA 134* 136  K 3.8 4.1  CL 101 106  CO2 20* 21*  GLUCOSE 95 98  BUN 47* 34*  CREATININE 2.42* 2.09*  CALCIUM 9.7 9.0   GFR: Estimated Creatinine Clearance: 42 mL/min (A) (by C-G formula based on SCr of 2.09 mg/dL (H)). Liver Function Tests: Recent Labs  Lab 01/01/22 0059  AST 21  ALT 15  ALKPHOS 62  BILITOT 1.2  PROT 11.3*  ALBUMIN 3.7   No results for input(s): "LIPASE", "AMYLASE" in the last 168 hours. No results for input(s): "AMMONIA" in the last 168 hours. Coagulation Profile: Recent Labs  Lab 01/01/22 0059  INR 1.7*   Cardiac Enzymes: No results for input(s): "CKTOTAL", "CKMB", "CKMBINDEX", "TROPONINI" in the last 168 hours. BNP (last 3 results) No results for input(s): "PROBNP" in the last 8760 hours. HbA1C: No results for input(s): "HGBA1C" in the last 72 hours. CBG: Recent Labs  Lab 01/01/22 0053  GLUCAP 104*   Lipid Profile: No results for input(s): "CHOL", "HDL", "LDLCALC", "TRIG", "CHOLHDL", "LDLDIRECT" in the last 72 hours. Thyroid Function Tests: No results for input(s): "TSH", "T4TOTAL", "FREET4", "T3FREE", "THYROIDAB" in the last 72 hours. Anemia Panel: No results for input(s): "VITAMINB12", "FOLATE", "FERRITIN", "TIBC", "IRON", "RETICCTPCT" in the last 72 hours. Urine analysis:    Component Value Date/Time   COLORURINE YELLOW 01/01/2022 0037   APPEARANCEUR CLEAR 01/01/2022 0037   APPEARANCEUR Clear 03/06/2020 1551   LABSPEC 1.013 01/01/2022 0037   PHURINE 5.0 01/01/2022 0037   GLUCOSEU 50 (A) 01/01/2022 0037   HGBUR SMALL (A) 01/01/2022 0037   BILIRUBINUR NEGATIVE 01/01/2022 0037   BILIRUBINUR  Negative 03/06/2020 1551   KETONESUR NEGATIVE 01/01/2022 0037   PROTEINUR 30 (A) 01/01/2022 0037   UROBILINOGEN 0.2 03/18/2014 2345   NITRITE NEGATIVE 01/01/2022 0037   LEUKOCYTESUR NEGATIVE 01/01/2022 0037   Sepsis Labs: @LABRCNTIP (procalcitonin:4,lacticidven:4)  ) Recent Results (from the past 240 hour(s))  Culture, blood (Routine x 2)     Status: Abnormal (Preliminary result)   Collection Time: 01/01/22 12:45 AM   Specimen: BLOOD LEFT ARM  Result Value Ref Range Status   Specimen Description BLOOD LEFT ARM  Final   Special Requests   Final    BOTTLES DRAWN AEROBIC AND ANAEROBIC Blood Culture adequate volume   Culture  Setup Time   Final    GRAM POSITIVE COCCI IN CHAINS AEROBIC BOTTLE ONLY CRITICAL RESULT CALLED TO, READ BACK BY AND VERIFIED WITH: PHARMD C AMEND [id number] FCP    Culture (A)  Final    GROUP A STREP (S.PYOGENES) ISOLATED SUSCEPTIBILITIES TO FOLLOW Performed at [facility], [place] [address], [place],  [address]    Report Status PENDING  Incomplete  Blood Culture ID Panel (Reflexed)     Status: Abnormal   Collection Time: 01/01/22 12:45 AM  Result Value Ref Range Status   Enterococcus faecalis NOT DETECTED NOT DETECTED Final   Enterococcus Faecium NOT DETECTED NOT DETECTED Final   Listeria monocytogenes NOT DETECTED  NOT DETECTED Final   Staphylococcus species NOT DETECTED NOT DETECTED Final   Staphylococcus aureus (BCID) NOT DETECTED NOT DETECTED Final   Staphylococcus epidermidis NOT DETECTED NOT DETECTED Final   Staphylococcus lugdunensis NOT DETECTED NOT DETECTED Final   Streptococcus species DETECTED (A) NOT DETECTED Final    Comment: CRITICAL RESULT CALLED TO, READ BACK BY AND VERIFIED WITH: PHARMD C AMEND [id number] FCP    Streptococcus agalactiae NOT DETECTED NOT DETECTED Final   Streptococcus pneumoniae NOT DETECTED NOT DETECTED Final   Streptococcus pyogenes DETECTED (A) NOT DETECTED Final    Comment: CRITICAL RESULT CALLED TO,  READ BACK BY AND VERIFIED WITH: PHARMD C AMEND [id number] FCP    A.calcoaceticus-baumannii NOT DETECTED NOT DETECTED Final   Bacteroides fragilis NOT DETECTED NOT DETECTED Final   Enterobacterales NOT DETECTED NOT DETECTED Final   Enterobacter cloacae complex NOT DETECTED NOT DETECTED Final   Escherichia coli NOT DETECTED NOT DETECTED Final   Klebsiella aerogenes NOT DETECTED NOT DETECTED Final   Klebsiella oxytoca NOT DETECTED NOT DETECTED Final   Klebsiella pneumoniae NOT DETECTED NOT DETECTED Final   Proteus species NOT DETECTED NOT DETECTED Final   Salmonella species NOT DETECTED NOT DETECTED Final   Serratia marcescens NOT DETECTED NOT DETECTED Final   Haemophilus influenzae NOT DETECTED NOT DETECTED Final   Neisseria meningitidis NOT DETECTED NOT DETECTED Final   Pseudomonas aeruginosa NOT DETECTED NOT DETECTED Final   Stenotrophomonas maltophilia NOT DETECTED NOT DETECTED Final   Candida albicans NOT DETECTED NOT DETECTED Final   Candida auris NOT DETECTED NOT DETECTED Final   Candida glabrata NOT DETECTED NOT DETECTED Final   Candida krusei NOT DETECTED NOT DETECTED Final   Candida parapsilosis NOT DETECTED NOT DETECTED Final   Candida tropicalis NOT DETECTED NOT DETECTED Final   Cryptococcus neoformans/gattii NOT DETECTED NOT DETECTED Final    Comment: Performed at [facility], [address], [place], [place] [address]  Culture, blood (Routine x 2)     Status: None (Preliminary result)   Collection Time: 01/01/22  1:00 AM   Specimen: BLOOD RIGHT ARM  Result Value Ref Range Status   Specimen Description BLOOD RIGHT ARM  Final   Special Requests   Final    BOTTLES DRAWN AEROBIC ONLY Blood Culture adequate volume   Culture   Final    NO GROWTH < 12 HOURS Performed at [facility], [place] [address] [place], [place] [address]    Report Status PENDING  Incomplete  SARS Coronavirus 2 by RT PCR (hospital order, performed in [facility] hospital lab) *cepheid  single result test* Anterior Nasal Swab     Status: None   Collection Time: 01/01/22  2:35 AM   Specimen: Anterior Nasal Swab  Result Value Ref Range Status   SARS Coronavirus 2 by RT PCR NEGATIVE NEGATIVE Final    Comment: (NOTE) SARS-CoV-2 target nucleic acids are NOT DETECTED.  The SARS-CoV-2 RNA is generally detectable in upper and lower respiratory specimens during the acute phase of infection. The lowest concentration of SARS-CoV-2 viral copies this assay can detect is 250 copies / mL. A negative result does not preclude SARS-CoV-2 infection and should not be used as the sole basis for treatment or other patient management decisions.  A negative result may occur with improper specimen collection / handling, submission of specimen other than nasopharyngeal swab, presence of viral mutation(s) within the areas targeted by this assay, and inadequate number of viral copies (<250 copies / mL).  A negative result must be combined with clinical observations, patient history, and epidemiological information.  Fact Sheet for Patients:   https://www.patel.info/  Fact Sheet for Healthcare Providers: https://hall.com/  This test is not yet approved or  cleared by the [place] FDA and has been authorized for detection and/or diagnosis of SARS-CoV-2 by FDA under an Emergency Use Authorization (EUA).  This EUA will remain in effect (meaning this test can be used) for the duration of the COVID-19 declaration under Section 564(b)(1) of the Act, 21 U.S.C. section 360bbb-3(b)(1), unless the authorization is terminated or revoked sooner.  Performed at [facility], [place] [address], [place], [place] [address]          Radiology Studies: CT KNEE RIGHT WO CONTRAST  Result Date: 01/02/2022 CLINICAL DATA:  Soft tissue infection suspected. EXAM: CT OF THE RIGHT KNEE WITHOUT CONTRAST CT OF THE RIGHT TIBIA AND FIBULA WITHOUT CONTRAST  TECHNIQUE: Multidetector CT imaging of the right knee was performed according to the standard protocol. Multiplanar CT image reconstructions were also generated. Multidetector CT imaging of the right tibia and fibula was performed according to the standard protocol. Multiplanar CT image reconstructions were also generated. RADIATION DOSE REDUCTION: This exam was performed according to the departmental dose-optimization program which includes automated exposure control, adjustment of the mA and/or kV according to patient size and/or use of iterative reconstruction technique. COMPARISON:  Right knee and right tibia and fibula radiographs 01/01/2022 FINDINGS: RIGHT KNEE: Bones/Joint/Cartilage Mild-to-moderate medial compartment joint space narrowing. Moderate superior and lateral and mild inferior medial patellar degenerative osteophytes. Mild to moderate chronic enthesopathic changes at the quadriceps insertion on the patella. No acute fracture or dislocation. No cortical erosion is seen. Ligaments Suboptimally assessed by CT. Muscles and Tendons Normal density and size of the regional musculature. Soft tissues Large joint effusion. Moderate atherosclerotic calcifications. -- RIGHT TIBIA AND FIBULA: Bones/Joint/Cartilage There is diffuse decreased bone mineralization. The tibia and fibula cortices appear intact. No acute fracture. Ligaments Suboptimally assessed by CT. Muscles and Tendons Normal density and size of the regional musculature. Soft tissues Mild edema and swelling of the anteromedial, anterolateral, and lateral aspects of the right calf subcutaneous fat. There are dense atherosclerotic calcifications. Varicose veins are incidentally noted within the medial calf. IMPRESSION: 1. Large knee joint effusion. 2. Mild-to-moderate chronic enthesopathic changes at the quadriceps insertion on the patella. 3. Mild-to-moderate medial compartment and mild patellofemoral compartment osteoarthritis of the knee. 4. There  is mild subcutaneous fat edema and swelling throughout the anterior right knee and calf. This is compatible with cellulitis. No cortical erosion is seen to indicate CT evidence of acute osteomyelitis. Electronically Signed   By: Yvonne Kendall M.D.   On: 01/02/2022 12:50   CT TIBIA FIBULA RIGHT WO CONTRAST  Result Date: 01/02/2022 CLINICAL DATA:  Soft tissue infection suspected. EXAM: CT OF THE RIGHT KNEE WITHOUT CONTRAST CT OF THE RIGHT TIBIA AND FIBULA WITHOUT CONTRAST TECHNIQUE: Multidetector CT imaging of the right knee was performed according to the standard protocol. Multiplanar CT image reconstructions were also generated. Multidetector CT imaging of the right tibia and fibula was performed according to the standard protocol. Multiplanar CT image reconstructions were also generated. RADIATION DOSE REDUCTION: This exam was performed according to the departmental dose-optimization program which includes automated exposure control, adjustment of the mA and/or kV according to patient size and/or use of iterative reconstruction technique. COMPARISON:  Right knee and right tibia and fibula radiographs 01/01/2022 FINDINGS: RIGHT KNEE: Bones/Joint/Cartilage Mild-to-moderate medial compartment joint  space narrowing. Moderate superior and lateral and mild inferior medial patellar degenerative osteophytes. Mild to moderate chronic enthesopathic changes at the quadriceps insertion on the patella. No acute fracture or dislocation. No cortical erosion is seen. Ligaments Suboptimally assessed by CT. Muscles and Tendons Normal density and size of the regional musculature. Soft tissues Large joint effusion. Moderate atherosclerotic calcifications. -- RIGHT TIBIA AND FIBULA: Bones/Joint/Cartilage There is diffuse decreased bone mineralization. The tibia and fibula cortices appear intact. No acute fracture. Ligaments Suboptimally assessed by CT. Muscles and Tendons Normal density and size of the regional musculature. Soft  tissues Mild edema and swelling of the anteromedial, anterolateral, and lateral aspects of the right calf subcutaneous fat. There are dense atherosclerotic calcifications. Varicose veins are incidentally noted within the medial calf. IMPRESSION: 1. Large knee joint effusion. 2. Mild-to-moderate chronic enthesopathic changes at the quadriceps insertion on the patella. 3. Mild-to-moderate medial compartment and mild patellofemoral compartment osteoarthritis of the knee. 4. There is mild subcutaneous fat edema and swelling throughout the anterior right knee and calf. This is compatible with cellulitis. No cortical erosion is seen to indicate CT evidence of acute osteomyelitis. Electronically Signed   By: Yvonne Kendall M.D.   On: 01/02/2022 12:50   DG Tibia/Fibula Right  Result Date: 01/01/2022 CLINICAL DATA:  [id number] weakness and pain EXAM: RIGHT KNEE - 1-2 VIEW; RIGHT TIBIA AND FIBULA - 2 VIEW COMPARISON:  November 17, 2019 FINDINGS: No acute fracture or dislocation. Enthesopathic changes of the quadriceps tendon insertion on the patella. Mild degenerative changes of the patellofemoral compartment. No area of erosion or osseous destruction. No unexpected radiopaque foreign body. Vascular calcifications. Moderate to large knee joint effusion. IMPRESSION: 1. Moderate to large knee joint effusion. 2. Otherwise no acute osseous abnormality. Electronically Signed   By: Valentino Saxon M.D.   On: 01/01/2022 21:01   DG Knee 1-2 Views Right  Result Date: 01/01/2022 CLINICAL DATA:  [id number] weakness and pain EXAM: RIGHT KNEE - 1-2 VIEW; RIGHT TIBIA AND FIBULA - 2 VIEW COMPARISON:  November 17, 2019 FINDINGS: No acute fracture or dislocation. Enthesopathic changes of the quadriceps tendon insertion on the patella. Mild degenerative changes of the patellofemoral compartment. No area of erosion or osseous destruction. No unexpected radiopaque foreign body. Vascular calcifications. Moderate to large knee joint effusion.  IMPRESSION: 1. Moderate to large knee joint effusion. 2. Otherwise no acute osseous abnormality. Electronically Signed   By: Valentino Saxon M.D.   On: 01/01/2022 21:01   CT HEAD WO CONTRAST (5MM)  Result Date: 01/01/2022 CLINICAL DATA:  Mental status change. EXAM: CT HEAD WITHOUT CONTRAST TECHNIQUE: Contiguous axial images were obtained from the base of the skull through the vertex without intravenous contrast. RADIATION DOSE REDUCTION: This exam was performed according to the departmental dose-optimization program which includes automated exposure control, adjustment of the mA and/or kV according to patient size and/or use of iterative reconstruction technique. COMPARISON:  04/20/2015 FINDINGS: Brain: No evidence of acute infarction, hemorrhage, hydrocephalus, extra-axial collection or mass lesion/mass effect. There is mild diffuse low-attenuation within the subcortical and periventricular white matter compatible with chronic microvascular disease. Vascular: No hyperdense vessel or unexpected calcification. Skull: Normal. Negative for fracture or focal lesion. Sinuses/Orbits: Paranasal sinuses and mastoid air cells are clear. Other: None. IMPRESSION: 1. No acute intracranial abnormalities. 2. Chronic microvascular disease Electronically Signed   By: Kerby Moors M.D.   On: 01/01/2022 05:14   DG CHEST PORT 1 VIEW  Result Date: 01/01/2022 CLINICAL DATA:  Weakness and confusion. EXAM: PORTABLE  CHEST 1 VIEW COMPARISON:  06/18/2021. FINDINGS: Heart is enlarged and the mediastinal contour stable. Atherosclerotic calcification of the aorta is noted. Lung volumes are low resulting in vascular crowding. Mild airspace disease is present at the left lung base. No effusion or pneumothorax. No acute osseous abnormality. IMPRESSION: 1. Cardiomegaly. 2. Low lung volumes with patchy airspace disease at the left lung base, possible atelectasis or infiltrate. Electronically Signed   By: Brett Fairy M.D.   On:  01/01/2022 01:14        Scheduled Meds:  allopurinol  100 mg Oral Daily   apixaban  5 mg Oral BID   aspirin EC  81 mg Oral Daily   atorvastatin  20 mg Oral Daily   docusate sodium  100 mg Oral BID   gabapentin  800 mg Oral TID   linezolid  600 mg Oral Q12H   magnesium oxide  400 mg Oral QODAY   sodium chloride flush  3 mL Intravenous Q12H   Continuous Infusions:  sodium chloride 75 mL/hr at 01/02/22 1249   pencillin G potassium IV 4 Million Units (01/02/22 0527)     LOS: 1 day    Time spent: 55 minutes.    Dana Allan, MD  [facility] Pager #: [phone] 7PM-7AM contact night coverage as above
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
## 2022-01-02 NOTE — Progress Notes (Signed)
   01/02/22 0204 01/02/22 0241 01/02/22 0300  Vitals  BP (!) 89/70 96/73 103/72  MAP (mmHg) 78 81 80   After 500NS bolus as per order by Dr Alcario Drought. Ongoing plan of care.

## 2022-01-03 DIAGNOSIS — I5032 Chronic diastolic (congestive) heart failure: Secondary | ICD-10-CM

## 2022-01-03 DIAGNOSIS — M109 Gout, unspecified: Secondary | ICD-10-CM

## 2022-01-03 DIAGNOSIS — N179 Acute kidney failure, unspecified: Secondary | ICD-10-CM | POA: Diagnosis not present

## 2022-01-03 DIAGNOSIS — E669 Obesity, unspecified: Secondary | ICD-10-CM

## 2022-01-03 DIAGNOSIS — R6520 Severe sepsis without septic shock: Secondary | ICD-10-CM

## 2022-01-03 DIAGNOSIS — A400 Sepsis due to streptococcus, group A: Secondary | ICD-10-CM

## 2022-01-03 DIAGNOSIS — N1831 Chronic kidney disease, stage 3a: Secondary | ICD-10-CM

## 2022-01-03 DIAGNOSIS — M00261 Other streptococcal arthritis, right knee: Secondary | ICD-10-CM | POA: Diagnosis not present

## 2022-01-03 DIAGNOSIS — D509 Iron deficiency anemia, unspecified: Secondary | ICD-10-CM | POA: Insufficient documentation

## 2022-01-03 DIAGNOSIS — I10 Essential (primary) hypertension: Secondary | ICD-10-CM

## 2022-01-03 LAB — CBC WITH DIFFERENTIAL/PLATELET
Abs Immature Granulocytes: 0.06 10*3/uL (ref 0.00–0.07)
Basophils Absolute: 0 10*3/uL (ref 0.0–0.1)
Basophils Relative: 0 %
Eosinophils Absolute: 0 10*3/uL (ref 0.0–0.5)
Eosinophils Relative: 0 %
HCT: 30.3 % — ABNORMAL LOW (ref 39.0–52.0)
Hemoglobin: 10.2 g/dL — ABNORMAL LOW (ref 13.0–17.0)
Immature Granulocytes: 1 %
Lymphocytes Relative: 12 %
Lymphs Abs: 1.5 10*3/uL (ref 0.7–4.0)
MCH: 24 pg — ABNORMAL LOW (ref 26.0–34.0)
MCHC: 33.7 g/dL (ref 30.0–36.0)
MCV: 71.3 fL — ABNORMAL LOW (ref 80.0–100.0)
Monocytes Absolute: 1 10*3/uL (ref 0.1–1.0)
Monocytes Relative: 9 %
Neutro Abs: 9.5 10*3/uL — ABNORMAL HIGH (ref 1.7–7.7)
Neutrophils Relative %: 78 %
Platelets: 122 10*3/uL — ABNORMAL LOW (ref 150–400)
RBC: 4.25 MIL/uL (ref 4.22–5.81)
RDW: 14.6 % (ref 11.5–15.5)
WBC: 12.1 10*3/uL — ABNORMAL HIGH (ref 4.0–10.5)
nRBC: 0 % (ref 0.0–0.2)

## 2022-01-03 LAB — RENAL FUNCTION PANEL
Albumin: 2.6 g/dL — ABNORMAL LOW (ref 3.5–5.0)
Anion gap: 9 (ref 5–15)
BUN: 28 mg/dL — ABNORMAL HIGH (ref 8–23)
CO2: 19 mmol/L — ABNORMAL LOW (ref 22–32)
Calcium: 8.7 mg/dL — ABNORMAL LOW (ref 8.9–10.3)
Chloride: 107 mmol/L (ref 98–111)
Creatinine, Ser: 1.8 mg/dL — ABNORMAL HIGH (ref 0.61–1.24)
GFR, Estimated: 42 mL/min — ABNORMAL LOW (ref >60)
Glucose, Bld: 106 mg/dL — ABNORMAL HIGH (ref 70–99)
Phosphorus: 2.5 mg/dL (ref 2.5–4.6)
Potassium: 4.1 mmol/L (ref 3.5–5.1)
Sodium: 135 mmol/L (ref 135–145)

## 2022-01-03 LAB — CULTURE, BLOOD (ROUTINE X 2): Special Requests: ADEQUATE

## 2022-01-03 MED ORDER — COLCHICINE 0.6 MG PO TABS
0.6000 mg | ORAL_TABLET | Freq: Every day | ORAL | Status: DC
  Administered 2022-01-03 – 2022-01-06 (×4): 0.6 mg via ORAL
  Filled 2022-01-03 (×4): qty 1

## 2022-01-03 MED ORDER — METOPROLOL TARTRATE 5 MG/5ML IV SOLN
5.0000 mg | Freq: Once | INTRAVENOUS | Status: AC
  Administered 2022-01-03: 5 mg via INTRAVENOUS
  Filled 2022-01-03: qty 5

## 2022-01-03 MED ORDER — CARVEDILOL 25 MG PO TABS
25.0000 mg | ORAL_TABLET | Freq: Two times a day (BID) | ORAL | Status: DC
  Administered 2022-01-03 – 2022-01-06 (×8): 25 mg via ORAL
  Filled 2022-01-03 (×8): qty 1

## 2022-01-03 MED ORDER — PREDNISONE 20 MG PO TABS
40.0000 mg | ORAL_TABLET | Freq: Every day | ORAL | Status: DC
  Administered 2022-01-04 – 2022-01-06 (×3): 40 mg via ORAL
  Filled 2022-01-03 (×3): qty 2

## 2022-01-03 NOTE — Assessment & Plan Note (Addendum)
Baseline creatinine 1.4.  2.4 on admission, improving with fluids to 1.6 today.   - Hold diuretics, ARB - Oral hydration - Trend BMP

## 2022-01-03 NOTE — Assessment & Plan Note (Addendum)
Hgb 10, slightly down from admission.  Microcytic.  Iron saturation low - IV iron once

## 2022-01-03 NOTE — Assessment & Plan Note (Signed)
BMI 32 

## 2022-01-03 NOTE — Assessment & Plan Note (Addendum)
Patient developed severe pain and swelling of the right knee.  Orthopedics were consulted and performed aspiration and showed MSU, negative Gram stain and negative culture.  S/p therapeutic aspiration and steroid injection 10/3 - Continue colchicine, prednisone - Follow culture data

## 2022-01-03 NOTE — Assessment & Plan Note (Addendum)
Heart rate improved to the 80s and 90s with resuming Coreg, clonidine.  Still somewhat elevated due to pain, will defer diltiazem for now. - Continue Coreg - Continue Eliquis

## 2022-01-03 NOTE — Consult Note (Signed)
   [facility] CM Inpatient Consult   01/03/2022  [name] 11-02-1960 [id number]  [facility] [employer] [ACO] Patient: [employer]  Primary Care Provider:  [name], MD, [facility] and Wellness   Patient screened for hospitalization with noted high risk score for unplanned readmission risk and to assess for potential [facility] service needs for post hospital transition.  Review of patient's medical record reveals patient is admitted with sepsis with acute renal failure noted.  11:00 am came by to speak with patient however, he was in patient care.  Plan:  Continue to follow progress and disposition to assess for post hospital care management needs.    For questions contact:   Natividad Brood, RN BSN [facility] Liaison  [phone] business mobile phone Toll free office [phone]  Fax number: [phone] [email or web address]

## 2022-01-03 NOTE — Progress Notes (Signed)
  Progress Note   Patient: [name] ZJI:[id number] DOB: Feb 14, 1961 DOA: 01/01/2022     2 DOS: the patient was seen and examined on 01/03/2022 at 11:12AM      Brief hospital course: [name] is a 61 y.o. M with dCHF, AF on Eliquis, CKD IIIb baseline 1.3-1.4, chronic pain, HTN, HLD, and OSA not on CPAP who presented with weakness, fever and rigors.  Found to have group A strep bacteremia and gout of the right knee.     Assessment and Plan: * Sepsis due to group A Streptococcus with acute renal failure without septic shock (HCC) Presented with leukocytosis, fever, and acute kidney injury, Cr >2.  Source group A strep bacteremia, unclear source. - Continue penicillin and Zyvox - Consult ID, appreciate cares  Gout of right knee Acute gout this admission. MSU on aspirate. - COntinue colchicine - Start prednisone - Follow culture data  Microcytic anemia Hgb 10, slightly down from admission.  Microcytic. - Check iron stores  Obesity (BMI 30-39.9) BMI 32  AKI (acute kidney injury) ([place]) Cr 2.4 on admission.  Improved to 1.8 today.  Baseline 1.4 - Stop fluids - Oral hydration - Trend BMP  Persistent atrial fibrillation (HCC) HR elevated - Resume beta blocker - Continue Eliquis  OSA (obstructive sleep apnea) - CPAP at night  Stage 3a chronic kidney disease (CKD) (HCC) Baseline 1.3-1.4.  CKD IIIb ruled out.  Cr still above baseline.  Chronic diastolic heart failure (HCC) Appears euvolemic.  Acute heart failure ruled out. - Resume Carvedilol - Hold furosemide, Farxiga, spironolactone, olmesartan until BP improves  HTN (hypertension) BP slightly elevated.  Antihypertensives held due to ?sepsis/soft BP - Hold olmesartan, furosemide, spironolactone, clonidine, hydralazine - Resume carvedilol          Subjective: Patient feeling tired.  Had fever.  No confusion, respiratory distress.  R knee still extremely swollen and uncomofrtable and painful.  Still with right  calf swelling.     Physical Exam: BP (!) 130/105 (BP Location: Left Arm)   Pulse 90   Temp 99.8 F (37.7 C) (Oral)   Resp 16   Ht 5\' 9"  (1.753 m)   Wt 98.6 kg   SpO2 97%   BMI 32.10 kg/m   Adult male, lying in bed, no acute distress, apears tired. Tachcyardic, irregular.  No murmurs appreciated, LLE no edema.  Right knee swollen and 1+ pitting below. Respiratory rate normal, lungs clear without rales or wheezing.   Abdomen soft without tendenress to palpation Attention normal, affect normal, strength normal     Data Reviewed: Discussed with Cardiology and Infectious disease Creatinine improving Sodium and potassium normla WBC down to 12 Aspirate of right knee with MSU, gram stain normal Albumin 2.6 Platelets 122, microcytic anemia     Family Communication: Wife at bedside    Disposition: Status is: Inpatient Continue IV antibiotics for group a strep bacteremia and follow ID for final recommendations.  Likely several days away from readiness for discharge due to knee swelling.           Author: Edwin Dada, MD 01/03/2022 1:37 PM  For on call review [email or web address].

## 2022-01-03 NOTE — TOC Progression Note (Signed)
Transition of Care [facility]) - Progression Note    Patient Details  Name: [name] MRN: [id number] Date of Birth: 23-Jun-1960  Transition of Care [facility]) CM/SW Contact  Zenon Mayo, RN Phone Number: 01/03/2022, 4:41 PM  Clinical Narrative:    from home, Sepsis R knee, bacteremia, conts on IV abx, IVF's, bld cx pending. TOC following.        Expected Discharge Plan and Services                                                 Social Determinants of Health (SDOH) Interventions    Readmission Risk Interventions     No data to display

## 2022-01-03 NOTE — Assessment & Plan Note (Signed)
-   CPAP at night °

## 2022-01-03 NOTE — Assessment & Plan Note (Addendum)
Baseline 1.3-1.4.  CKD IIIb ruled out.  Cr still above baseline.

## 2022-01-03 NOTE — Assessment & Plan Note (Addendum)
Presented with leukocytosis, fever, and acute kidney injury, Cr >2.  Source group A strep bacteremia, unclear source.  Linezolid given for toxin suppression, ID have stopped this. - Continue penicillin  - Consult ID, appreciate cares

## 2022-01-03 NOTE — Assessment & Plan Note (Addendum)
BP still elevated.  Antihypertensives held initially due to ?sepsis/soft BP - Hold olmesartan, furosemide, spironolactone - Continue Coreg - Resume clonidine, hydralazine

## 2022-01-03 NOTE — Hospital Course (Addendum)
[name] is a 61 y.o. M with dCHF, AF on Eliquis, CKD IIIb baseline 1.3-1.4, chronic pain, HTN, HLD, and OSA not on CPAP who presented with weakness, fever and rigors.  Found to have group A strep bacteremia and gout of the right knee.   9/30: Admitted on antibiotics 10/1: Blood cultures growing GAS, ID consulted; Ortho consulted for swollen knee, aspirate done --> MSU crystals 10/3: Knee aspirated again

## 2022-01-03 NOTE — Assessment & Plan Note (Addendum)
Appears euvolemic.  Acute heart failure ruled out. - Continue Carvedilol - Hold furosemide, Farxiga, spironolactone, olmesartan given AKI

## 2022-01-03 NOTE — Progress Notes (Signed)
[facility] for Infectious Disease  Date of Admission:  01/01/2022           Reason for visit: Follow up on Strep pyogenes bacteremia  Current antibiotics: Penicillin Linezolid  ASSESSMENT:    61 y.o. male admitted with:  #Strep pyogenes bacteremia #Right knee pain with concern for septic arthritis and gout #Right lower leg cellulitis  Patient presenting with right lower extremity pain, cellulitis, right knee pain/effusion, and strep pyogenes bacteremia.  Status post aspiration of the right knee with orthopedic surgery 01/02/2022 with 46,200 WBCs (90% neutrophils) and crystals also noted.  Gram stain negative and cultures pending.  #Acute on chronic kidney disease stage III Creatinine this morning improving with a creatinine clearance of 49.3.  #Peripheral arterial disease and chronic venous insufficiency Has been followed by vascular surgery previously with intervention of the left lower extremity in July 2023.  RECOMMENDATIONS:    Continue penicillin G Continue linezolid for suppression of toxin production Follow cultures Appreciate orthopedic surgery evaluation.  Query whether he would still benefit from washout given concern for septic arthritis and concomitant gout Noted to have some more proximal medial thigh tenderness today.  Would have low threshold for further imaging Will follow   Principal Problem:   Sepsis due to group A Streptococcus with acute renal failure without septic shock (HCC) Active Problems:   HTN (hypertension)   Chronic diastolic heart failure (HCC)   Stage 3a chronic kidney disease (CKD) (HCC)   OSA (obstructive sleep apnea)   Persistent atrial fibrillation (HCC)   AKI (acute kidney injury) ([place])   Obesity (BMI 30-39.9)   Microcytic anemia    MEDICATIONS:    Scheduled Meds:  allopurinol  100 mg Oral Daily   apixaban  5 mg Oral BID   aspirin EC  81 mg Oral Daily   atorvastatin  20 mg Oral Daily   carvedilol  25 mg Oral BID  WC   docusate sodium  100 mg Oral BID   gabapentin  800 mg Oral TID   linezolid  600 mg Oral Q12H   magnesium oxide  400 mg Oral QODAY   sodium chloride flush  3 mL Intravenous Q12H   Continuous Infusions:  methocarbamol (ROBAXIN) IV Stopped (01/03/22 0227)   pencillin G potassium IV 4 Million Units (01/03/22 0546)   PRN Meds:.acetaminophen **OR** acetaminophen, albuterol, bisacodyl, guaiFENesin, hydrALAZINE, methocarbamol (ROBAXIN) IV, methocarbamol, morphine injection, ondansetron **OR** ondansetron (ZOFRAN) IV, oxyCODONE, polyethylene glycol  SUBJECTIVE:   24 hour events:  Febrile Tmax 103.1 Seen by orthopedics Concern for septic arthritis vs gout S/P arthrocentesis = 46k WBC, 90% PMN, crystals seen Gram stain negative, Cx pending Washout deferred for now by ortho CT shows likely cellulitis.  No deep infection appreciated on non-contrast study Repeat cx obtained   Patient reports ongoing right knee pain.  Reports fevers.  May be slightly a little better today.  Review of Systems  All other systems reviewed and are negative.     OBJECTIVE:   Blood pressure (!) 130/105, pulse 94, temperature 99.8 F (37.7 C), temperature source Oral, resp. rate 20, height 5\' 9"  (1.753 m), weight 98.6 kg, SpO2 96 %. Body mass index is 32.1 kg/m.  Physical Exam Constitutional:      Appearance: Normal appearance.  HENT:     Head: Normocephalic and atraumatic.  Eyes:     Extraocular Movements: Extraocular movements intact.     Conjunctiva/sclera: Conjunctivae normal.  Abdominal:     General: There is no  distension.     Palpations: Abdomen is soft.  Musculoskeletal:     Cervical back: Normal range of motion and neck supple.     Comments: Right knee with effusion and tenderness.  There is added warmth.  He has some increased tenderness to his proximal medial thigh with soft tissues. Chronic venous stasis changes noted bilaterally.  Skin:    General: Skin is warm and dry.   Neurological:     General: No focal deficit present.     Mental Status: He is alert and oriented to person, place, and time.  Psychiatric:        Mood and Affect: Mood normal.        Behavior: Behavior normal.      Lab Results: Lab Results  Component Value Date   WBC 12.1 (H) 01/03/2022   HGB 10.2 (L) 01/03/2022   HCT 30.3 (L) 01/03/2022   MCV 71.3 (L) 01/03/2022   PLT 122 (L) 01/03/2022    Lab Results  Component Value Date   NA 135 01/03/2022   K 4.1 01/03/2022   CO2 19 (L) 01/03/2022   GLUCOSE 106 (H) 01/03/2022   BUN 28 (H) 01/03/2022   CREATININE 1.80 (H) 01/03/2022   CALCIUM 8.7 (L) 01/03/2022   GFRNONAA 42 (L) 01/03/2022   GFRAA 69 04/29/2020    Lab Results  Component Value Date   ALT 15 01/01/2022   AST 21 01/01/2022   ALKPHOS 62 01/01/2022   BILITOT 1.2 01/01/2022       Component Value Date/Time   CRP 1.6 (H) 03/18/2014 2341       Component Value Date/Time   ESRSEDRATE 3 03/18/2014 2341     I have reviewed the micro and lab results in Epic.  Imaging: CT KNEE RIGHT WO CONTRAST  Result Date: 01/02/2022 CLINICAL DATA:  Soft tissue infection suspected. EXAM: CT OF THE RIGHT KNEE WITHOUT CONTRAST CT OF THE RIGHT TIBIA AND FIBULA WITHOUT CONTRAST TECHNIQUE: Multidetector CT imaging of the right knee was performed according to the standard protocol. Multiplanar CT image reconstructions were also generated. Multidetector CT imaging of the right tibia and fibula was performed according to the standard protocol. Multiplanar CT image reconstructions were also generated. RADIATION DOSE REDUCTION: This exam was performed according to the departmental dose-optimization program which includes automated exposure control, adjustment of the mA and/or kV according to patient size and/or use of iterative reconstruction technique. COMPARISON:  Right knee and right tibia and fibula radiographs 01/01/2022 FINDINGS: RIGHT KNEE: Bones/Joint/Cartilage Mild-to-moderate medial  compartment joint space narrowing. Moderate superior and lateral and mild inferior medial patellar degenerative osteophytes. Mild to moderate chronic enthesopathic changes at the quadriceps insertion on the patella. No acute fracture or dislocation. No cortical erosion is seen. Ligaments Suboptimally assessed by CT. Muscles and Tendons Normal density and size of the regional musculature. Soft tissues Large joint effusion. Moderate atherosclerotic calcifications. -- RIGHT TIBIA AND FIBULA: Bones/Joint/Cartilage There is diffuse decreased bone mineralization. The tibia and fibula cortices appear intact. No acute fracture. Ligaments Suboptimally assessed by CT. Muscles and Tendons Normal density and size of the regional musculature. Soft tissues Mild edema and swelling of the anteromedial, anterolateral, and lateral aspects of the right calf subcutaneous fat. There are dense atherosclerotic calcifications. Varicose veins are incidentally noted within the medial calf. IMPRESSION: 1. Large knee joint effusion. 2. Mild-to-moderate chronic enthesopathic changes at the quadriceps insertion on the patella. 3. Mild-to-moderate medial compartment and mild patellofemoral compartment osteoarthritis of the knee. 4. There is mild subcutaneous  fat edema and swelling throughout the anterior right knee and calf. This is compatible with cellulitis. No cortical erosion is seen to indicate CT evidence of acute osteomyelitis. Electronically Signed   By: Yvonne Kendall M.D.   On: 01/02/2022 12:50   CT TIBIA FIBULA RIGHT WO CONTRAST  Result Date: 01/02/2022 CLINICAL DATA:  Soft tissue infection suspected. EXAM: CT OF THE RIGHT KNEE WITHOUT CONTRAST CT OF THE RIGHT TIBIA AND FIBULA WITHOUT CONTRAST TECHNIQUE: Multidetector CT imaging of the right knee was performed according to the standard protocol. Multiplanar CT image reconstructions were also generated. Multidetector CT imaging of the right tibia and fibula was performed according to  the standard protocol. Multiplanar CT image reconstructions were also generated. RADIATION DOSE REDUCTION: This exam was performed according to the departmental dose-optimization program which includes automated exposure control, adjustment of the mA and/or kV according to patient size and/or use of iterative reconstruction technique. COMPARISON:  Right knee and right tibia and fibula radiographs 01/01/2022 FINDINGS: RIGHT KNEE: Bones/Joint/Cartilage Mild-to-moderate medial compartment joint space narrowing. Moderate superior and lateral and mild inferior medial patellar degenerative osteophytes. Mild to moderate chronic enthesopathic changes at the quadriceps insertion on the patella. No acute fracture or dislocation. No cortical erosion is seen. Ligaments Suboptimally assessed by CT. Muscles and Tendons Normal density and size of the regional musculature. Soft tissues Large joint effusion. Moderate atherosclerotic calcifications. -- RIGHT TIBIA AND FIBULA: Bones/Joint/Cartilage There is diffuse decreased bone mineralization. The tibia and fibula cortices appear intact. No acute fracture. Ligaments Suboptimally assessed by CT. Muscles and Tendons Normal density and size of the regional musculature. Soft tissues Mild edema and swelling of the anteromedial, anterolateral, and lateral aspects of the right calf subcutaneous fat. There are dense atherosclerotic calcifications. Varicose veins are incidentally noted within the medial calf. IMPRESSION: 1. Large knee joint effusion. 2. Mild-to-moderate chronic enthesopathic changes at the quadriceps insertion on the patella. 3. Mild-to-moderate medial compartment and mild patellofemoral compartment osteoarthritis of the knee. 4. There is mild subcutaneous fat edema and swelling throughout the anterior right knee and calf. This is compatible with cellulitis. No cortical erosion is seen to indicate CT evidence of acute osteomyelitis. Electronically Signed   By: Yvonne Kendall  M.D.   On: 01/02/2022 12:50   DG Tibia/Fibula Right  Result Date: 01/01/2022 CLINICAL DATA:  [id number] weakness and pain EXAM: RIGHT KNEE - 1-2 VIEW; RIGHT TIBIA AND FIBULA - 2 VIEW COMPARISON:  November 17, 2019 FINDINGS: No acute fracture or dislocation. Enthesopathic changes of the quadriceps tendon insertion on the patella. Mild degenerative changes of the patellofemoral compartment. No area of erosion or osseous destruction. No unexpected radiopaque foreign body. Vascular calcifications. Moderate to large knee joint effusion. IMPRESSION: 1. Moderate to large knee joint effusion. 2. Otherwise no acute osseous abnormality. Electronically Signed   By: Valentino Saxon M.D.   On: 01/01/2022 21:01   DG Knee 1-2 Views Right  Result Date: 01/01/2022 CLINICAL DATA:  [id number] weakness and pain EXAM: RIGHT KNEE - 1-2 VIEW; RIGHT TIBIA AND FIBULA - 2 VIEW COMPARISON:  November 17, 2019 FINDINGS: No acute fracture or dislocation. Enthesopathic changes of the quadriceps tendon insertion on the patella. Mild degenerative changes of the patellofemoral compartment. No area of erosion or osseous destruction. No unexpected radiopaque foreign body. Vascular calcifications. Moderate to large knee joint effusion. IMPRESSION: 1. Moderate to large knee joint effusion. 2. Otherwise no acute osseous abnormality. Electronically Signed   By: Valentino Saxon M.D.   On: 01/01/2022 21:01  Imaging independently reviewed in Epic.    Raynelle Highland for Infectious Disease [facility] [phone] pager 01/03/2022, 8:40 AM

## 2022-01-04 DIAGNOSIS — M00261 Other streptococcal arthritis, right knee: Secondary | ICD-10-CM | POA: Diagnosis not present

## 2022-01-04 DIAGNOSIS — N179 Acute kidney failure, unspecified: Secondary | ICD-10-CM | POA: Diagnosis not present

## 2022-01-04 DIAGNOSIS — A400 Sepsis due to streptococcus, group A: Secondary | ICD-10-CM | POA: Diagnosis not present

## 2022-01-04 DIAGNOSIS — N1831 Chronic kidney disease, stage 3a: Secondary | ICD-10-CM | POA: Diagnosis not present

## 2022-01-04 DIAGNOSIS — M109 Gout, unspecified: Secondary | ICD-10-CM | POA: Diagnosis not present

## 2022-01-04 DIAGNOSIS — R6520 Severe sepsis without septic shock: Secondary | ICD-10-CM | POA: Diagnosis not present

## 2022-01-04 LAB — BASIC METABOLIC PANEL
Anion gap: 9 (ref 5–15)
BUN: 28 mg/dL — ABNORMAL HIGH (ref 8–23)
CO2: 18 mmol/L — ABNORMAL LOW (ref 22–32)
Calcium: 8.8 mg/dL — ABNORMAL LOW (ref 8.9–10.3)
Chloride: 108 mmol/L (ref 98–111)
Creatinine, Ser: 1.63 mg/dL — ABNORMAL HIGH (ref 0.61–1.24)
GFR, Estimated: 47 mL/min — ABNORMAL LOW (ref >60)
Glucose, Bld: 128 mg/dL — ABNORMAL HIGH (ref 70–99)
Potassium: 4.4 mmol/L (ref 3.5–5.1)
Sodium: 135 mmol/L (ref 135–145)

## 2022-01-04 LAB — IRON AND TIBC
Iron: 17 ug/dL — ABNORMAL LOW (ref 45–182)
Saturation Ratios: 10 % — ABNORMAL LOW (ref 17.9–39.5)
TIBC: 178 ug/dL — ABNORMAL LOW (ref 250–450)
UIBC: 161 ug/dL

## 2022-01-04 LAB — FERRITIN: Ferritin: 531 ng/mL — ABNORMAL HIGH (ref 24–336)

## 2022-01-04 MED ORDER — PENICILLIN G POTASSIUM 20000000 UNITS IJ SOLR
12.0000 10*6.[IU] | Freq: Two times a day (BID) | INTRAVENOUS | Status: DC
  Administered 2022-01-04 – 2022-01-06 (×4): 12 10*6.[IU] via INTRAVENOUS
  Filled 2022-01-04 (×7): qty 12

## 2022-01-04 MED ORDER — LIDOCAINE HCL (PF) 1 % IJ SOLN: 5.0000 mL | Freq: Once | INTRAMUSCULAR | Status: DC

## 2022-01-04 MED ORDER — METHYLPREDNISOLONE ACETATE 40 MG/ML IJ SUSP
40.0000 mg | Freq: Once | INTRAMUSCULAR | Status: DC
  Filled 2022-01-04: qty 1

## 2022-01-04 MED ORDER — BUPIVACAINE HCL 0.5 % IJ SOLN
5.0000 mL | Freq: Once | INTRAMUSCULAR | Status: DC
  Filled 2022-01-04: qty 5

## 2022-01-04 MED ORDER — BUPIVACAINE HCL (PF) 0.5 % IJ SOLN
5.0000 mL | Freq: Once | INTRAMUSCULAR | Status: DC
  Filled 2022-01-04: qty 10

## 2022-01-04 MED ORDER — HYDRALAZINE HCL 50 MG PO TABS
50.0000 mg | ORAL_TABLET | Freq: Three times a day (TID) | ORAL | Status: DC
  Administered 2022-01-04 – 2022-01-06 (×8): 50 mg via ORAL
  Filled 2022-01-04 (×8): qty 1

## 2022-01-04 MED ORDER — SODIUM CHLORIDE 0.9 % IV SOLN
125.0000 mg | Freq: Once | INTRAVENOUS | Status: AC
  Administered 2022-01-04: 125 mg via INTRAVENOUS
  Filled 2022-01-04: qty 10

## 2022-01-04 MED ORDER — CLONIDINE HCL 0.2 MG PO TABS
0.2000 mg | ORAL_TABLET | Freq: Three times a day (TID) | ORAL | Status: DC
  Administered 2022-01-04 – 2022-01-06 (×8): 0.2 mg via ORAL
  Filled 2022-01-04 (×8): qty 1

## 2022-01-04 MED ORDER — LIDOCAINE HCL (PF) 1 % IJ SOLN
INTRAMUSCULAR | Status: AC
  Filled 2022-01-04: qty 5

## 2022-01-04 MED ORDER — LIDOCAINE HCL 1 % IJ SOLN
5.0000 mL | Freq: Once | INTRAMUSCULAR | Status: DC
  Filled 2022-01-04: qty 5

## 2022-01-04 NOTE — Progress Notes (Signed)
[name] for Infectious Disease  Date of Admission:  01/01/2022           Reason for visit: Follow up on Strep pyogenes bacteremia   Current antibiotics: Penicillin Linezolid  ASSESSMENT:    61 y.o. male admitted with:  #Strep pyogenes bacteremia #Right knee pain with concern for septic arthritis and/or gout #Right lower leg cellulitis  Patient presenting with right lower extremity pain, cellulitis, right knee pain/effusion, and strep pyogenes bacteremia.  Status post aspiration of the right knee with orthopedic surgery 01/02/2022 with 46,200 WBCs (90% neutrophils) and crystals also noted.  Gram stain negative and cultures no growth to date.  His repeat blood cultures from 01/02/2022 remain no growth to date as well.   #Acute on chronic kidney disease stage IIIa Creatinine this morning improving with a creatinine clearance of 54.2.   #Peripheral arterial disease and chronic venous insufficiency Has been followed by vascular surgery previously with intervention of the left lower extremity in July 2023.  RECOMMENDATIONS:    Continue penicillin G Continue linezolid for suppression of toxin production through today then stop Follow cultures Appreciate orthopedic surgery evaluation.  Has been started on prednisone and colchicine for suspected gout flare of the right knee.  Following cultures to determine further need for washout given concern for septic arthritis as well Will continue to follow   Principal Problem:   Sepsis due to group A Streptococcus with acute renal failure without septic shock (HCC) Active Problems:   HTN (hypertension)   Chronic diastolic heart failure (HCC)   Stage 3a chronic kidney disease (CKD) (HCC)   OSA (obstructive sleep apnea)   Persistent atrial fibrillation (HCC)   AKI (acute kidney injury) ([place])   Obesity (BMI 30-39.9)   Microcytic anemia   Gout of right knee    MEDICATIONS:    Scheduled Meds:  allopurinol  100 mg Oral Daily    apixaban  5 mg Oral BID   aspirin EC  81 mg Oral Daily   atorvastatin  20 mg Oral Daily   carvedilol  25 mg Oral BID WC   cloNIDine  0.2 mg Oral TID   colchicine  0.6 mg Oral Daily   docusate sodium  100 mg Oral BID   gabapentin  800 mg Oral TID   hydrALAZINE  50 mg Oral TID   linezolid  600 mg Oral Q12H   magnesium oxide  400 mg Oral QODAY   predniSONE  40 mg Oral Q breakfast   sodium chloride flush  3 mL Intravenous Q12H   Continuous Infusions:  ferric gluconate (FERRLECIT) IVPB     methocarbamol (ROBAXIN) IV Stopped (01/03/22 0227)   penicillin G potassium 12 Million Units in dextrose 5 % 500 mL continuous infusion     PRN Meds:.acetaminophen **OR** acetaminophen, albuterol, bisacodyl, guaiFENesin, hydrALAZINE, methocarbamol (ROBAXIN) IV, methocarbamol, morphine injection, ondansetron **OR** ondansetron (ZOFRAN) IV, oxyCODONE, polyethylene glycol  SUBJECTIVE:   24 hour events:  Pharmacy has adjusted penicillin dose based on improving renal function Discussed with orthopedic surgery yesterday who did not feel patient warranted washout for possible septic arthritis on top of gouty arthritis Repeat blood cultures at this time are no growth Knee aspiration cultures also remain no growth Fever curve has improved with a Tmax of 99.6 Patient was started on prednisone yesterday Also started on colchicine Other labs have shown iron deficiency and plan is for replacement No new imaging  Reports that his knee feels a little bit better today.  Still with significant tenderness and decreased range of motion.  Review of Systems  All other systems reviewed and are negative.     OBJECTIVE:   Blood pressure (!) 157/119, pulse 100, temperature 98.9 F (37.2 C), temperature source Oral, resp. rate 20, height 5\' 9"  (1.753 m), weight 97.7 kg, SpO2 97 %. Body mass index is 31.81 kg/m.  Physical Exam Constitutional:      General: He is not in acute distress.    Appearance: Normal  appearance.  HENT:     Head: Normocephalic and atraumatic.  Abdominal:     General: There is no distension.     Palpations: Abdomen is soft.  Musculoskeletal:     Comments: Right knee effusion with tenderness to palpation, warmth, and decreased range of motion persist.  Improved tenderness to his proximal leg.  Skin:    General: Skin is warm and dry.  Neurological:     General: No focal deficit present.     Mental Status: He is alert and oriented to person, place, and time.  Psychiatric:        Mood and Affect: Mood normal.        Behavior: Behavior normal.      Lab Results: Lab Results  Component Value Date   WBC 12.1 (H) 01/03/2022   HGB 10.2 (L) 01/03/2022   HCT 30.3 (L) 01/03/2022   MCV 71.3 (L) 01/03/2022   PLT 122 (L) 01/03/2022    Lab Results  Component Value Date   NA 135 01/04/2022   K 4.4 01/04/2022   CO2 18 (L) 01/04/2022   GLUCOSE 128 (H) 01/04/2022   BUN 28 (H) 01/04/2022   CREATININE 1.63 (H) 01/04/2022   CALCIUM 8.8 (L) 01/04/2022   GFRNONAA 47 (L) 01/04/2022   GFRAA 69 04/29/2020    Lab Results  Component Value Date   ALT 15 01/01/2022   AST 21 01/01/2022   ALKPHOS 62 01/01/2022   BILITOT 1.2 01/01/2022       Component Value Date/Time   CRP 1.6 (H) 03/18/2014 2341       Component Value Date/Time   ESRSEDRATE 3 03/18/2014 2341     I have reviewed the micro and lab results in Epic.  Imaging: CT KNEE RIGHT WO CONTRAST  Result Date: 01/02/2022 CLINICAL DATA:  Soft tissue infection suspected. EXAM: CT OF THE RIGHT KNEE WITHOUT CONTRAST CT OF THE RIGHT TIBIA AND FIBULA WITHOUT CONTRAST TECHNIQUE: Multidetector CT imaging of the right knee was performed according to the standard protocol. Multiplanar CT image reconstructions were also generated. Multidetector CT imaging of the right tibia and fibula was performed according to the standard protocol. Multiplanar CT image reconstructions were also generated. RADIATION DOSE REDUCTION: This exam was  performed according to the departmental dose-optimization program which includes automated exposure control, adjustment of the mA and/or kV according to patient size and/or use of iterative reconstruction technique. COMPARISON:  Right knee and right tibia and fibula radiographs 01/01/2022 FINDINGS: RIGHT KNEE: Bones/Joint/Cartilage Mild-to-moderate medial compartment joint space narrowing. Moderate superior and lateral and mild inferior medial patellar degenerative osteophytes. Mild to moderate chronic enthesopathic changes at the quadriceps insertion on the patella. No acute fracture or dislocation. No cortical erosion is seen. Ligaments Suboptimally assessed by CT. Muscles and Tendons Normal density and size of the regional musculature. Soft tissues Large joint effusion. Moderate atherosclerotic calcifications. -- RIGHT TIBIA AND FIBULA: Bones/Joint/Cartilage There is diffuse decreased bone mineralization. The tibia and fibula cortices appear intact. No acute fracture. Ligaments Suboptimally assessed  by CT. Muscles and Tendons Normal density and size of the regional musculature. Soft tissues Mild edema and swelling of the anteromedial, anterolateral, and lateral aspects of the right calf subcutaneous fat. There are dense atherosclerotic calcifications. Varicose veins are incidentally noted within the medial calf. IMPRESSION: 1. Large knee joint effusion. 2. Mild-to-moderate chronic enthesopathic changes at the quadriceps insertion on the patella. 3. Mild-to-moderate medial compartment and mild patellofemoral compartment osteoarthritis of the knee. 4. There is mild subcutaneous fat edema and swelling throughout the anterior right knee and calf. This is compatible with cellulitis. No cortical erosion is seen to indicate CT evidence of acute osteomyelitis. Electronically Signed   By: Yvonne Kendall M.D.   On: 01/02/2022 12:50   CT TIBIA FIBULA RIGHT WO CONTRAST  Result Date: 01/02/2022 CLINICAL DATA:  Soft tissue  infection suspected. EXAM: CT OF THE RIGHT KNEE WITHOUT CONTRAST CT OF THE RIGHT TIBIA AND FIBULA WITHOUT CONTRAST TECHNIQUE: Multidetector CT imaging of the right knee was performed according to the standard protocol. Multiplanar CT image reconstructions were also generated. Multidetector CT imaging of the right tibia and fibula was performed according to the standard protocol. Multiplanar CT image reconstructions were also generated. RADIATION DOSE REDUCTION: This exam was performed according to the departmental dose-optimization program which includes automated exposure control, adjustment of the mA and/or kV according to patient size and/or use of iterative reconstruction technique. COMPARISON:  Right knee and right tibia and fibula radiographs 01/01/2022 FINDINGS: RIGHT KNEE: Bones/Joint/Cartilage Mild-to-moderate medial compartment joint space narrowing. Moderate superior and lateral and mild inferior medial patellar degenerative osteophytes. Mild to moderate chronic enthesopathic changes at the quadriceps insertion on the patella. No acute fracture or dislocation. No cortical erosion is seen. Ligaments Suboptimally assessed by CT. Muscles and Tendons Normal density and size of the regional musculature. Soft tissues Large joint effusion. Moderate atherosclerotic calcifications. -- RIGHT TIBIA AND FIBULA: Bones/Joint/Cartilage There is diffuse decreased bone mineralization. The tibia and fibula cortices appear intact. No acute fracture. Ligaments Suboptimally assessed by CT. Muscles and Tendons Normal density and size of the regional musculature. Soft tissues Mild edema and swelling of the anteromedial, anterolateral, and lateral aspects of the right calf subcutaneous fat. There are dense atherosclerotic calcifications. Varicose veins are incidentally noted within the medial calf. IMPRESSION: 1. Large knee joint effusion. 2. Mild-to-moderate chronic enthesopathic changes at the quadriceps insertion on the  patella. 3. Mild-to-moderate medial compartment and mild patellofemoral compartment osteoarthritis of the knee. 4. There is mild subcutaneous fat edema and swelling throughout the anterior right knee and calf. This is compatible with cellulitis. No cortical erosion is seen to indicate CT evidence of acute osteomyelitis. Electronically Signed   By: Yvonne Kendall M.D.   On: 01/02/2022 12:50     Imaging independently reviewed in Epic.    Raynelle Highland for Infectious Disease [facility] [phone] pager 01/04/2022, 8:09 AM

## 2022-01-04 NOTE — Plan of Care (Signed)
  Problem: Respiratory: Goal: Ability to maintain adequate ventilation will improve Outcome: Progressing   Problem: Pain Managment: Goal: General experience of comfort will improve Outcome: Progressing

## 2022-01-04 NOTE — Progress Notes (Signed)
Progress Note   Patient: [name] [id number] DOB: [birth year] DOA: 01/01/2022     3 DOS: the patient was seen and examined on 01/04/2022 at 9:30AM      Brief hospital course: [name] is a 61 y.o. M with dCHF, AF on Eliquis, CKD IIIb baseline 1.3-1.4, chronic pain, HTN, HLD, and OSA not on CPAP who presented with weakness, fever and rigors.  Found to have group A strep bacteremia and gout of the right knee.   9/30: Admitted on antibiotics 10/1: Blood cultures growing GAS, ID consulted; Ortho consulted for swollen knee, aspirate done --> MSU crystals      Assessment and Plan: * Sepsis due to group A Streptococcus with acute renal failure without septic shock ([place]) Presented with leukocytosis, fever, and acute kidney injury, Cr >2.  Source group A strep bacteremia, unclear source. - Continue penicillin and Zyvox - Consult ID, appreciate cares  Gout of right knee  MSU on aspirate.  At this point cultures negative and we favor that this is reactive gout only, not septic joint.  Ortho will perform therapeutic aspiration and steroid injection today and tomorrow. - Continue colchicine, prednisone - Follow culture data  Microcytic anemia Hgb 10, slightly down from admission.  Microcytic.  Iron saturation low - IV iron once   Obesity (BMI 30-39.9) BMI 32  AKI (acute kidney injury) ([place]) Baseline creatinine 1.4.  2.4 on admission, improving with fluids to 1.6 today.   - Hold diuretics, ARB - Oral hydration - Trend BMP  Persistent atrial fibrillation (HCC) HR better but still elevated - Continue Coreg - Continue Eliquis  OSA (obstructive sleep apnea) - CPAP at night  Stage 3a chronic kidney disease (CKD) (HCC) Baseline 1.3-1.4.  CKD IIIb ruled out.  Cr still above baseline.  Chronic diastolic heart failure (HCC) Appears euvolemic.  Acute heart failure ruled out. - Continue Carvedilol - Hold furosemide, Farxiga, spironolactone, olmesartan given AKI  HTN  (hypertension) BP still elevated.  Antihypertensives held initially due to ?sepsis/soft BP - Hold olmesartan, furosemide, spironolactone - Continue Coreg - Resume clonidine, hydralazine          Subjective: Patient feeling slightly better.  No fever overnight.  No confusion.  No chest pain, dyspnea.  The right leg is slightly better, but still quite swollen at the knee and below     Physical Exam: BP (!) 157/119 (BP Location: Left Arm)   Pulse 100   Temp 98.9 F (37.2 C) (Oral)   Resp 20   Ht 5\' 9"  (1.753 m)   Wt 97.7 kg   SpO2 97%   BMI 31.81 kg/m   Adult male, lying in bed, appears tired, interactive Tachycardic, irregular, no murmurs that I can appreciate, no JVD, no left leg swelling, the right leg is better than yesterday but still swollen due to cellulitis Respiratory rate normal, lungs clear without rales or wheezes Abdomen soft without tenderness palpation or guarding, no ascites or distention Attention normal, affect blunted, judgment insight appear normal, moves upper extremities with generalized weakness but symmetric strength, speech fluent    Data Reviewed: Discussed with orthopedics and infectious disease Patient metabolic panel shows slightly improved creatinine Iron saturation low Ferritin level elevated in the setting of acute infection    Family Communication: None at the bedside    Disposition: Status is: Inpatient Patient was admitted with sepsis due to strep bacteremia  He has what appears to be reactive gout in the right knee, and so we are continuing to treat  this  Once his knee is improving, we will begin disposition planning with infectious disease.        Author: Edwin Dada, MD 01/04/2022 12:41 PM  For on call review [email or web address].

## 2022-01-04 NOTE — Progress Notes (Signed)
PHARMACY NOTE:  ANTIMICROBIAL RENAL DOSAGE ADJUSTMENT  Current antimicrobial regimen includes a mismatch between antimicrobial dosage and estimated renal function.  As per policy approved by the Pharmacy & Therapeutics and Medical Executive Committees, the antimicrobial dosage will be adjusted accordingly.  Current antimicrobial dosage:  Penicillin 2g IV every 6 hours  Indication: Group A Strep bacteremia  Renal Function:  Estimated Creatinine Clearance: 54.2 mL/min (A) (by C-G formula based on SCr of 1.63 mg/dL (H)). []      On intermittent HD, scheduled: []      On CRRT    Antimicrobial dosage has been changed to:  Penicillin 24g daily as a continuous infusion (divided as 12g CI q12h)  Additional comments:   Thank you for allowing pharmacy to be a part of this patient's care.  Alycia Rossetti, PharmD, BCPS Infectious Diseases Clinical Pharmacist 01/04/2022 7:48 AM   **Pharmacist phone directory can now be found on [email or web address] ([email or web address]).  Listed under [place].

## 2022-01-04 NOTE — Consult Note (Signed)
   [facility] Inpatient Consult   01/04/2022  [name] 03/18/1961 [id number]  [facility] [[facility]] Patient: [name]   Follow up:  Met with patient and significant other at the bedside to follow up on transition of care community follow up.  PCP is endorsed and currently ongoing interventions are in place for needs, reviewed PT notes today.  Patient is showing as an Ortho bundle as well which follows for post hospital care coordinations needs.  For questions,  Natividad Brood, RN BSN [place]  [phone] business mobile phone Toll free office [phone]  *[place]  [phone] Fax number: [phone] [email or web address]

## 2022-01-04 NOTE — Progress Notes (Signed)
Occupational Therapy Treatment Patient Details Name: [name] MRN: [id number] DOB: 05-27-1960 Today's Date: 01/04/2022   History of present illness Pt is a 61 year old man admitted on 01/01/22 with fever, SOB, LE pain and shaking with difficulty walking due to sepsis and PNA. Admitted with cellulitis, right knee pain/effusion, and strep pyogenes bacteremia s/p aspiration of the right knee with orthopedic surgery 01/02/2022 with 46,200 WBCs (90% neutrophils) and crystals also noted. PMH: HTN, CHF, HLD, gout, CKD, afib, PAD, aortic stenosis, cellulitis, chronic back pain.   OT comments  Pt progressing towards acute OT goals. Focus of session was bed mobility and standing from EOB 1x. FACES pain 10/10 in R knee during activity. Notified nursing of pain level. Nurse present for part of session. D/c recommendation remains unchanged. Hopefully as pain becomes better controlled pt will progress well with mobility to be able to d/c home.    Recommendations for follow up therapy are one component of a multi-disciplinary discharge planning process, led by the attending physician.  Recommendations may be updated based on patient status, additional functional criteria and insurance authorization.    Follow Up Recommendations  Other (comment) (TBD pending progress)    Assistance Recommended at Discharge Frequent or constant Supervision/Assistance  Patient can return home with the following  A lot of help with bathing/dressing/bathroom;Assistance with cooking/housework;Assist for transportation;Help with stairs or ramp for entrance;A lot of help with walking and/or transfers   Equipment Recommendations  Other (comment) (TBD)    Recommendations for Other Services      Precautions / Restrictions Precautions Precautions: Fall;Other (comment) Precaution Comments: right knee swelling/warmth Restrictions Weight Bearing Restrictions: No       Mobility Bed Mobility Overal bed mobility: Needs  Assistance Bed Mobility: Supine to Sit, Sit to Supine     Supine to sit: Mod assist, HOB elevated Sit to supine: Min assist   General bed mobility comments: Assist with RLE, cues for technique and to scoot bottom to EOB, increased time due to pain, assist with trunk as well. Assist needed to bring RLE into bed    Transfers Overall transfer level: Needs assistance Equipment used: Rolling walker (2 wheels) Transfers: Sit to/from Stand Sit to Stand: From elevated surface, Mod assist, +2 physical assistance           General transfer comment: Increased time, stood from EOB x1, cues for hand placement/technique, flexed trunk with cues for upright with flexed right knee. Increased WB through BUEs.     Balance Overall balance assessment: Needs assistance Sitting-balance support: Feet supported, Bilateral upper extremity supported Sitting balance-Leahy Scale: Fair Sitting balance - Comments: Requires BUE support as position of comfort due to pain   Standing balance support: During functional activity, Reliant on assistive device for balance Standing balance-Leahy Scale: Poor Standing balance comment: Requires UE support on RW                           ADL either performed or assessed with clinical judgement   ADL Overall ADL's : Needs assistance/impaired                                       General ADL Comments: Focus of session was bed mobility and standing from EOB. Pt with 10/10 FACEs R knee pain. Maintained BUE support in sitting and standing d/t pain.    Extremity/Trunk Assessment Upper Extremity  Assessment Upper Extremity Assessment: Overall WFL for tasks assessed   Lower Extremity Assessment Lower Extremity Assessment: Defer to PT evaluation        Vision       Perception     Praxis      Cognition Arousal/Alertness: Awake/alert Behavior During Therapy: WFL for tasks assessed/performed Overall Cognitive Status: Within Functional  Limits for tasks assessed                                          Exercises      Shoulder Instructions       General Comments HR up to 140 bpm., A-fib with activity.    Pertinent Vitals/ Pain       Pain Assessment Pain Assessment: Faces Faces Pain Scale: Hurts worst Pain Location: R knee Pain Descriptors / Indicators: Aching, Grimacing, Guarding, Sore, Discomfort Pain Intervention(s): Limited activity within patient's tolerance, Monitored during session, Repositioned, Patient requesting pain meds-RN notified, Ice applied  Home Living                                          Prior Functioning/Environment              Frequency  Min 2X/week        Progress Toward Goals  OT Goals(current goals can now be found in the care plan section)  Progress towards OT goals: Progressing toward goals  Acute Rehab OT Goals OT Goal Formulation: With patient Time For Goal Achievement: 01/16/22 Potential to Achieve Goals: Good ADL Goals Pt Will Perform Grooming: with supervision;standing Pt Will Perform Lower Body Bathing: with min assist;sit to/from stand Pt Will Perform Lower Body Dressing: with min assist;sit to/from stand Pt Will Transfer to Toilet: with min assist;ambulating;bedside commode Pt Will Perform Toileting - Clothing Manipulation and hygiene: with min assist;sit to/from stand Additional ADL Goal #1: Pt will perform bed mobility with min assist in preparation for ADLs.  Plan Discharge plan remains appropriate    Co-evaluation    PT/OT/SLP Co-Evaluation/Treatment: Yes Reason for Co-Treatment: For patient/therapist safety;To address functional/ADL transfers PT goals addressed during session: Mobility/safety with mobility;Balance;Proper use of DME OT goals addressed during session: ADL's and self-care      AM-PAC OT "6 Clicks" Daily Activity     Outcome Measure   Help from another person eating meals?: None Help from  another person taking care of personal grooming?: A Little Help from another person toileting, which includes using toliet, bedpan, or urinal?: A Lot Help from another person bathing (including washing, rinsing, drying)?: A Lot Help from another person to put on and taking off regular upper body clothing?: A Little Help from another person to put on and taking off regular lower body clothing?: A Lot 6 Click Score: 16    End of Session    OT Visit Diagnosis: Pain Pain - Right/Left: Right Pain - part of body: Knee   Activity Tolerance Patient limited by pain   Patient Left in bed;with call bell/phone within reach;with bed alarm set   Nurse Communication Patient requests pain meds;Mobility status        Time: 0258-5277 OT Time Calculation (min): 29 min  Charges: OT General Charges $OT Visit: 1 Visit OT Treatments $Self Care/Home Management : 8-22 mins  Raynald Kemp, OT Acute  Rehabilitation Services Office: [place], [place] 01/04/2022, 12:37 PM

## 2022-01-04 NOTE — Progress Notes (Signed)
Physical Therapy Treatment Patient Details Name: [name] MRN: [id number] DOB: Feb 12, 1961 Today's Date: 01/04/2022   History of Present Illness Pt is a 61 year old man admitted on 01/01/22 with fever, SOB, LE pain and shaking with difficulty walking due to sepsis and PNA. Admitted with cellulitis, right knee pain/effusion, and strep pyogenes bacteremia s/p aspiration of the right knee with orthopedic surgery 01/02/2022 with 46,200 WBCs (90% neutrophils) and crystals also noted. PMH: HTN, CHF, HLD, gout, CKD, afib, PAD, aortic stenosis, cellulitis, chronic back pain.    PT Comments    Patient progressing slowly towards PT goals. Session focused on progressive mobility OOB and standing. Pt reluctant to mobilize due to pain in right knee despite being pre medicated. Requires increased time for all mobility and assist to manage RLE and trunk to get to EOB. Able to stand with heavy reliance on UEs and no weight on RLe. Able to side step/shuffle along side bed with Min A and use of RW. Limited mainly by pain and needs encouragement to participate. Might need to adjust pain regimen so pt can participate more in mobility. HR up to 140 bpm A-fib with activity. Will follow and hoping pt can progress well enough o return home.    Recommendations for follow up therapy are one component of a multi-disciplinary discharge planning process, led by the attending physician.  Recommendations may be updated based on patient status, additional functional criteria and insurance authorization.  Follow Up Recommendations  Other (comment) (hoping home pending progression)     Assistance Recommended at Discharge Frequent or constant Supervision/Assistance  Patient can return home with the following A lot of help with walking and/or transfers;A lot of help with bathing/dressing/bathroom;Help with stairs or ramp for entrance;Assist for transportation;Assistance with cooking/housework   Equipment Recommendations  Rolling  walker (2 wheels) (pending progress)    Recommendations for Other Services       Precautions / Restrictions Precautions Precautions: Fall;Other (comment) Precaution Comments: right knee swelling/warmth Restrictions Weight Bearing Restrictions: No     Mobility  Bed Mobility Overal bed mobility: Needs Assistance Bed Mobility: Supine to Sit, Sit to Supine     Supine to sit: Mod assist, HOB elevated Sit to supine: Min assist   General bed mobility comments: Assist with RLE, cues for technique and to scoot bottom to EOB, increased time due to pain, assist with trunk as well. Assist needed to bring RLE into bed    Transfers Overall transfer level: Needs assistance Equipment used: Rolling walker (2 wheels) Transfers: Sit to/from Stand Sit to Stand: From elevated surface, Mod assist, +2 physical assistance           General transfer comment: Increased time, stood from EOB x1, cues for hand placement/technique, flexed trunk with cues for upright with flexed right knee. Increased WB through [place].    Ambulation/Gait Ambulation/Gait assistance: Min assist, +2 safety/equipment Gait Distance (Feet): 2 Feet Assistive device: Rolling walker (2 wheels) Gait Pattern/deviations: Trunk flexed Gait velocity: decreased     General Gait Details: Able tot ake a few shuffling steps towards HOB with MIn A and increased time.   Stairs             Wheelchair Mobility    Modified Rankin (Stroke Patients Only)       Balance Overall balance assessment: Needs assistance Sitting-balance support: Feet supported, Bilateral upper extremity supported Sitting balance-Leahy Scale: Fair Sitting balance - Comments: Requires BUE support as position of comfort due to pain   Standing balance  support: During functional activity, Reliant on assistive device for balance Standing balance-Leahy Scale: Poor Standing balance comment: Requires UE support on RW                             Cognition Arousal/Alertness: Awake/alert Behavior During Therapy: WFL for tasks assessed/performed Overall Cognitive Status: Within Functional Limits for tasks assessed                                          Exercises      General Comments General comments (skin integrity, edema, etc.): HR up to 140 bpm., A-fib with activity.      Pertinent Vitals/Pain Pain Assessment Pain Assessment: Faces Faces Pain Scale: Hurts worst Pain Location: R knee Pain Descriptors / Indicators: Aching, Grimacing, Guarding, Sore, Discomfort Pain Intervention(s): Monitored during session, Limited activity within patient's tolerance, Repositioned, Patient requesting pain meds-RN notified, Ice applied    Home Living                          Prior Function            PT Goals (current goals can now be found in the care plan section) Progress towards PT goals: Progressing toward goals (slowly)    Frequency    Min 3X/week      PT Plan Current plan remains appropriate    Co-evaluation PT/OT/SLP Co-Evaluation/Treatment: Yes Reason for Co-Treatment: For patient/therapist safety;To address functional/ADL transfers PT goals addressed during session: Mobility/safety with mobility;Balance;Proper use of DME        AM-PAC PT "6 Clicks" Mobility   Outcome Measure  Help needed turning from your back to your side while in a flat bed without using bedrails?: A Lot Help needed moving from lying on your back to sitting on the side of a flat bed without using bedrails?: A Lot Help needed moving to and from a bed to a chair (including a wheelchair)?: A Lot Help needed standing up from a chair using your arms (e.g., wheelchair or bedside chair)?: A Lot Help needed to walk in hospital room?: Total Help needed climbing 3-5 steps with a railing? : Total 6 Click Score: 10    End of Session Equipment Utilized During Treatment: Gait belt Activity Tolerance: Patient limited  by pain Patient left: in bed;with call bell/phone within reach;with bed alarm set;with nursing/sitter in room Nurse Communication: Mobility status PT Visit Diagnosis: Unsteadiness on feet (R26.81);Muscle weakness (generalized) (M62.81);Difficulty in walking, not elsewhere classified (R26.2);Pain Pain - Right/Left: Right Pain - part of body: Knee     Time: [id number] PT Time Calculation (min) (ACUTE ONLY): 26 min  Charges:  $Therapeutic Activity: 8-22 mins                     Marisa Severin, PT, DPT Acute Rehabilitation Services Secure chat preferred Office [phone]      Marguarite Arbour A Twinkle Sockwell 01/04/2022, 11:17 AM

## 2022-01-04 NOTE — Progress Notes (Signed)
Subjective: Patient reports pain as marked.  Having trouble mobilizing OOB with PT/OT due to right knee pain.   Synovial Fluid analysis from the aspiration on 01/02/22 thus far shows the presence of gout crystals in the R knee aspirate. WBCs are elevated to 46,000 which is high for a native joint but gout can cause this. No organisms on gram stain. Cultures continue to show NGTD.   Has been placed on Colchicine and Prednisone.   Objective:   VITALS:   Vitals:   01/03/22 2316 01/04/22 0321 01/04/22 0500 01/04/22 0738  BP:  (!) 174/116  (!) 157/119  Pulse: (!) 119 (!) 103  100  Resp: 19 18  20   Temp:  97.8 F (36.6 C)  98.9 F (37.2 C)  TempSrc:  Axillary  Oral  SpO2:  96%  97%  Weight:   97.7 kg   Height:          Latest Ref Rng & Units 01/03/2022   12:27 AM 01/02/2022    3:37 AM 01/01/2022   12:59 AM  CBC  WBC 4.0 - 10.5 K/uL 12.1  13.3  19.9   Hemoglobin 13.0 - 17.0 g/dL 10.2  10.4  12.1   Hematocrit 39.0 - 52.0 % 30.3  31.6  37.3   Platelets 150 - 400 K/uL 122  120  156       Latest Ref Rng & Units 01/04/2022   12:35 AM 01/03/2022   12:27 AM 01/02/2022    3:37 AM  BMP  Glucose 70 - 99 mg/dL 128  106  98   BUN 8 - 23 mg/dL 28  28  34   Creatinine 0.61 - 1.24 mg/dL 1.63  1.80  2.09   Sodium 135 - 145 mmol/L 135  135  136   Potassium 3.5 - 5.1 mmol/L 4.4  4.1  4.1   Chloride 98 - 111 mmol/L 108  107  106   CO2 22 - 32 mmol/L 18  19  21    Calcium 8.9 - 10.3 mg/dL 8.8  8.7  9.0    Intake/Output      10/02 0701 10/03 0700 10/03 0701 10/04 0700   P.O. 240    I.V. (mL/kg) 637.9 (6.5)    IV Piggyback 750    Total Intake(mL/kg) 1627.9 (16.7)    Urine (mL/kg/hr) 1450 (0.6)    Stool     Total Output 1450    Net +177.9         Stool Occurrence  1 x      Physical Exam: General: NAD.  Sleeping in bed, easily awoken, calm Resp: No increased wob Cardio: regular rate and rhythm ABD soft Neurologically intact MSK Neurovascularly intact Sensation intact  distally Intact pulses distally Dorsiflexion/Plantar flexion intact Minimal right knee ROM Severe effusion present Erythema present Less edema to anterior tibial region   Assessment:    Principal Problem:   Sepsis due to group A Streptococcus with acute renal failure without septic shock (HCC) Active Problems:   HTN (hypertension)   Chronic diastolic heart failure (HCC)   Stage 3a chronic kidney disease (CKD) (HCC)   OSA (obstructive sleep apnea)   Persistent atrial fibrillation (HCC)   AKI (acute kidney injury) ([place])   Obesity (BMI 30-39.9)   Microcytic anemia   Gout of right knee   Plan: I will perform another right knee aspiration today. This time I will also inject some steroid and pain medicine into the joint at the end of the  procedure. I will send the aspirated contents to the lab as a 2nd sample to confirm gout vs. Septic arthritis.     Up with therapy Incentive Spirometry Elevate and Apply ice  Weightbearing: WBAT RLE Insicional and dressing care: PRN Orthopedic device(s): None Showering: Keep dressing dry VTE prophylaxis:  on home Eliquis 5mg  bid  , SCDs, ambulation Pain control: per primary Follow - up plan: 1 week after discharge Contact information:  Edmonia Lynch MD, Aggie Moats PA-C  Dispo:  Per primary. Can f/u with ortho as outpatient 1 week after leaving hospital.      Alisa Graff Office [phone] 01/04/2022, 12:47 PM

## 2022-01-04 NOTE — Care Plan (Signed)
Spoke with partner Aquinna by phone

## 2022-01-04 NOTE — Procedures (Signed)
After consent was obtained, using sterile technique the right knee was prepped and 23ml plain Lidocaine 1% was used as local anesthetic. The joint was entered and 60 ml's of blood colored fluid was withdrawn. I decided to not send this to the lab as it did not appear to be purulent or even like much joint fluid was present. It was mainly a hematoma evacuation. Depomedrol 40 mg and 4 ml plain Marcaine 0.5% was then injected and the needle withdrawn.  The procedure was well tolerated.     [place] Office/Answering service [phone]

## 2022-01-05 DIAGNOSIS — A400 Sepsis due to streptococcus, group A: Secondary | ICD-10-CM | POA: Diagnosis not present

## 2022-01-05 DIAGNOSIS — M109 Gout, unspecified: Secondary | ICD-10-CM | POA: Diagnosis not present

## 2022-01-05 DIAGNOSIS — N1831 Chronic kidney disease, stage 3a: Secondary | ICD-10-CM | POA: Diagnosis not present

## 2022-01-05 DIAGNOSIS — N179 Acute kidney failure, unspecified: Secondary | ICD-10-CM | POA: Diagnosis not present

## 2022-01-05 DIAGNOSIS — R6520 Severe sepsis without septic shock: Secondary | ICD-10-CM | POA: Diagnosis not present

## 2022-01-05 LAB — BASIC METABOLIC PANEL
Anion gap: 8 (ref 5–15)
BUN: 32 mg/dL — ABNORMAL HIGH (ref 8–23)
CO2: 19 mmol/L — ABNORMAL LOW (ref 22–32)
Calcium: 9.1 mg/dL (ref 8.9–10.3)
Chloride: 107 mmol/L (ref 98–111)
Creatinine, Ser: 1.46 mg/dL — ABNORMAL HIGH (ref 0.61–1.24)
GFR, Estimated: 54 mL/min — ABNORMAL LOW (ref >60)
Glucose, Bld: 144 mg/dL — ABNORMAL HIGH (ref 70–99)
Potassium: 5.2 mmol/L — ABNORMAL HIGH (ref 3.5–5.1)
Sodium: 134 mmol/L — ABNORMAL LOW (ref 135–145)

## 2022-01-05 LAB — CBC
HCT: 26.7 % — ABNORMAL LOW (ref 39.0–52.0)
Hemoglobin: 9.4 g/dL — ABNORMAL LOW (ref 13.0–17.0)
MCH: 24 pg — ABNORMAL LOW (ref 26.0–34.0)
MCHC: 35.2 g/dL (ref 30.0–36.0)
MCV: 68.1 fL — ABNORMAL LOW (ref 80.0–100.0)
Platelets: 155 10*3/uL (ref 150–400)
RBC: 3.92 MIL/uL — ABNORMAL LOW (ref 4.22–5.81)
RDW: 14.4 % (ref 11.5–15.5)
WBC: 6.7 10*3/uL (ref 4.0–10.5)
nRBC: 0 % (ref 0.0–0.2)

## 2022-01-05 NOTE — Progress Notes (Signed)
[facility]  Date of Admission:  01/01/2022           Reason for visit: Follow up on Strep pyogenes bacteremia   Current antibiotics: Penicillin  ASSESSMENT:    61 y.o. male admitted with:  #Strep pyogenes bacteremia #Right knee pain  Patient resenting with right knee pain/effusion and strep pyogenes bacteremia.  Blood cultures have cleared as of 01/02/2022.  Patient has received penicillin as well as 3 days of linezolid for toxin suppression (10/1-10/3).  There was concern for septic arthritis of the right knee as well.  He underwent aspiration on 01/02/2022 with 46,200 WBCs (90% neutrophils).  Gram stain and cultures have been negative.  Crystals were noted on his synovial fluid analysis and he has been treated as well for presumptive gout with colchicine and prednisone.  Status post repeat aspiration 01/04/2022 with bloody fluid evacuated.  Continues to have knee pain although possibly slowly improving.  Of note, patient had similar presentation in January 2019 with left knee pain status post synovial fluid aspiration with 40,111 WBCs and monosodium urate crystals noted at that time.  He was treated with a gout flare at that presentation also.   #Acute on chronic kidney disease stage IIIa AKI resolved with creatinine this morning improved with a creatinine clearance of 59.5.   #Peripheral arterial disease and chronic venous insufficiency Has been followed by vascular surgery previously with intervention of the left lower extremity in July 2023.  RECOMMENDATIONS:    Continue penicillin G Completed 3 days of linezolid Following cultures Appreciate orthopedic surgery evaluation Will follow Hopefully can finalize antibiotic plan in the next day or so   Principal Problem:   Sepsis due to group A Streptococcus with acute renal failure without septic shock ([place]) Active Problems:   HTN (hypertension)   Chronic diastolic heart failure (HCC)   Stage 3a  chronic kidney disease (CKD) (HCC)   OSA (obstructive sleep apnea)   Persistent atrial fibrillation (HCC)   AKI (acute kidney injury) ([place])   Obesity (BMI 30-39.9)   Microcytic anemia   Gout of right knee    MEDICATIONS:    Scheduled Meds:  allopurinol  100 mg Oral Daily   apixaban  5 mg Oral BID   aspirin EC  81 mg Oral Daily   atorvastatin  20 mg Oral Daily   bupivacaine(PF)  5 mL Intra-articular Once   carvedilol  25 mg Oral BID WC   cloNIDine  0.2 mg Oral TID   colchicine  0.6 mg Oral Daily   docusate sodium  100 mg Oral BID   gabapentin  800 mg Oral TID   hydrALAZINE  50 mg Oral TID   lidocaine (PF)  5 mL Intradermal Once   magnesium oxide  400 mg Oral QODAY   methylPREDNISolone acetate  40 mg Intra-articular Once   predniSONE  40 mg Oral Q breakfast   sodium chloride flush  3 mL Intravenous Q12H   Continuous Infusions:  methocarbamol (ROBAXIN) IV Stopped (01/03/22 0227)   penicillin G potassium 12 Million Units in dextrose 5 % 500 mL continuous infusion 41.7 mL/hr at 01/05/22 0210   PRN Meds:.acetaminophen **OR** acetaminophen, albuterol, bisacodyl, guaiFENesin, hydrALAZINE, methocarbamol (ROBAXIN) IV, methocarbamol, morphine injection, ondansetron **OR** ondansetron (ZOFRAN) IV, oxyCODONE, polyethylene glycol  SUBJECTIVE:   24 hour events:  Orthopedic surgery performed a another knee aspiration yesterday and simultaneously injected steroid and pain medication into the joint Procedure note reviewed showing 60 mL of blood  colored fluid was withdrawn, mainly hematoma per their assessment.  This specimen was not sent to the lab as it did not appear purulent or infectious per their note Prior aspirate cultures from 10/1 remain no growth Repeat blood cultures 10/1 also remain no growth WBC normalized Creatinine steadily improving Potassium 5.2 Afebrile, Tmax 98.9 Hypertensive No other events noted   Patient reports that his right knee pain is improved but is still  having pain.  He felt relief after aspiration yesterday.  He is not able to flex or extend his knee very much but is able to move it side to side better.  He has no fevers.  He denies any other new joint pain.  Review of Systems  All other systems reviewed and are negative.     OBJECTIVE:   Blood pressure (!) 144/113, pulse 66, temperature 97.7 F (36.5 C), temperature source Oral, resp. rate 15, height 5\' 9"  (1.753 m), weight 94.4 kg, SpO2 99 %. Body mass index is 30.73 kg/m.  Physical Exam Constitutional:      Appearance: Normal appearance.  Abdominal:     General: There is no distension.     Palpations: Abdomen is soft.  Musculoskeletal:        General: Swelling and tenderness present.     Cervical back: Normal range of motion and neck supple.     Comments: Right knee remains somewhat swollen with right knee swelling more prominent laterally.  Range of motion is decreased secondary to pain.  Skin:    General: Skin is warm and dry.     Comments: Chronic venous stasis changes bilaterally  Neurological:     General: No focal deficit present.     Mental Status: He is alert and oriented to person, place, and time.  Psychiatric:        Mood and Affect: Mood normal.        Behavior: Behavior normal.      Lab Results: Lab Results  Component Value Date   WBC 6.7 01/05/2022   HGB 9.4 (L) 01/05/2022   HCT 26.7 (L) 01/05/2022   MCV 68.1 (L) 01/05/2022   PLT 155 01/05/2022    Lab Results  Component Value Date   NA 134 (L) 01/05/2022   K 5.2 (H) 01/05/2022   CO2 19 (L) 01/05/2022   GLUCOSE 144 (H) 01/05/2022   BUN 32 (H) 01/05/2022   CREATININE 1.46 (H) 01/05/2022   CALCIUM 9.1 01/05/2022   GFRNONAA 54 (L) 01/05/2022   GFRAA 69 04/29/2020    Lab Results  Component Value Date   ALT 15 01/01/2022   AST 21 01/01/2022   ALKPHOS 62 01/01/2022   BILITOT 1.2 01/01/2022       Component Value Date/Time   CRP 1.6 (H) 03/18/2014 2341       Component Value Date/Time    ESRSEDRATE 3 03/18/2014 2341     I have reviewed the micro and lab results in Epic.  Imaging: No results found.   Imaging independently reviewed in Epic.    [facility] for Infectious [facility] [phone] pager 01/05/2022, 8:02 AM

## 2022-01-05 NOTE — Progress Notes (Signed)
Progress Note   Patient: [name] [id number] DOB: 09/27/1960 DOA: 01/01/2022     4 DOS: the patient was seen and examined on 01/05/2022 at 8:39AM      Brief hospital course: Mr. Barnfield is a 60 y.o. M with dCHF, AF on Eliquis, CKD IIIb baseline 1.3-1.4, chronic pain, HTN, HLD, and OSA not on CPAP who presented with weakness, fever and rigors.  Found to have group A strep bacteremia and gout of the right knee.   9/30: Admitted on antibiotics 10/1: Blood cultures growing GAS, ID consulted; Ortho consulted for swollen knee, aspirate done --> MSU crystals 10/3: Knee aspirated again      Assessment and Plan: * Sepsis due to group A Streptococcus with acute renal failure without septic shock ([place]) Presented with leukocytosis, fever, and acute kidney injury, Cr >2.  Source group A strep bacteremia, unclear source.  Linezolid given for toxin suppression, ID have stopped this. - Continue penicillin  - Consult ID, appreciate cares  Gout of right knee Patient developed severe pain and swelling of the right knee.  Orthopedics were consulted and performed aspiration and showed MSU, negative Gram stain and negative culture.  S/p therapeutic aspiration and steroid injection 10/3 - Continue colchicine, prednisone - Follow culture data  Iron deficiency anemia Hemoglobin mid nines, no blood loss.  Very microcytic.  Iron saturation low.  Given IV iron. - Oral iron at discharge - GI follow-up at discharge    Obesity (BMI 30-39.9) BMI 32  AKI (acute kidney injury) ([place]) 2.4 on admission, improved with fluids.  Resolved to baseline today. - Hold diuretics and ARB 1 more day - Trend BMP  Persistent atrial fibrillation (HCC) Heart rate improved to the 80s and 90s with resuming Coreg, clonidine.  Still somewhat elevated due to pain, will defer diltiazem for now. - Continue Coreg - Continue Eliquis  OSA (obstructive sleep apnea) - CPAP at night  Stage 3a chronic kidney disease (CKD)  (HCC) Baseline 1.3-1.4.  CKD IIIb ruled out.     Chronic diastolic heart failure (HCC) Appears euvolemic.  Acute heart failure ruled out. - Continue Carvedilol - Hold furosemide, Farxiga, spironolactone, olmesartan for least 1 more day given AKI  HTN (hypertension) Antihypertensives held initially due to ?sepsis/soft BP, then blood pressure rebounded, antihypertensives have been restarted BP now high normal - Continue Coreg, clonidine, hydralazine - Continue to hold olmesartan, furosemide, spironolactone for now          Subjective: Patient still with a lot of pain in the right knee, no chest pain, dyspnea.  No further fever.  No confusion.     Physical Exam: BP (!) 144/113 (BP Location: Right Arm)   Pulse 66   Temp 97.7 F (36.5 C) (Oral)   Resp 15   Ht 5\' 9"  (1.753 m)   Wt 94.4 kg   SpO2 99%   BMI 30.73 kg/m   Adult male, lying in bed, appears weak and tired Tachycardic, irregular, no murmurs, edema in the right calf has resolved, no JVD Respiratory rate normal, lungs clear without rales or wheezes Abdomen soft without tenderness palpation or guarding The right knee is still puffy and swollen Attention normal, affect appropriate, judgment insight appear normal    Data Reviewed: Basic metabolic panel shows insignificant shifts in sodium and potassium, creatinine 1.46  Family Communication: Partner at the bedside    Disposition: Status is: Inpatient The patient was admitted with strep bacteremia  He has had reactive gout, and has still has severe pain  in the right knee.  At the moment, I suspect ID could transition him to an outpatient regimen, but pain in the right knee and inability to walk limits disposition.  We will need to continue treatment with prednisone, colchicine, and hopefully he will be able to ambulate and transition to home in the next few days        Author: Edwin Dada, MD 01/05/2022 10:23 AM  For on call review  [email or web address].

## 2022-01-05 NOTE — Progress Notes (Signed)
Physical Therapy Treatment Patient Details Name: [name] MRN: [id number] DOB: [birth year] Today's Date: 01/05/2022   History of Present Illness Pt is a 61 year old man admitted on 01/01/22 with fever, SOB, LE pain and shaking with difficulty walking due to sepsis and PNA. Admitted with cellulitis, right knee pain/effusion, and strep pyogenes bacteremia s/p aspiration of the right knee with orthopedic surgery 01/02/2022 with 46,200 WBCs (90% neutrophils) and crystals also noted. PMH: HTN, CHF, HLD, gout, CKD, afib, PAD, aortic stenosis, cellulitis, chronic back pain.    PT Comments    Pt tolerates treatment well, demonstrating improve mobility of RLE. Pt performs bed mobility and transfers with reduced physical assistance, continuing to benefit from verbal cues to improve technique. Pt is able to ambulate for increased distances and reports reduced discomfort. PT provides encouragement for R knee flexion/extension ROM throughout the day along with additional attempts at ambulating with staff assistance. PT updates recommendations to HHPT at this time, pt will need to progress further prior to returning home.   Recommendations for follow up therapy are one component of a multi-disciplinary discharge planning process, led by the attending physician.  Recommendations may be updated based on patient status, additional functional criteria and insurance authorization.  Follow Up Recommendations  Home health PT     Assistance Recommended at Discharge Intermittent Supervision/Assistance  Patient can return home with the following A little help with walking and/or transfers;A lot of help with bathing/dressing/bathroom;Assistance with cooking/housework;Assist for transportation;Help with stairs or ramp for entrance   Equipment Recommendations  Rolling walker (2 wheels)    Recommendations for Other Services       Precautions / Restrictions Precautions Precautions: Fall;Other (comment) Precaution  Comments: right knee swelling Restrictions Weight Bearing Restrictions: No     Mobility  Bed Mobility Overal bed mobility: Needs Assistance Bed Mobility: Supine to Sit, Sit to Supine     Supine to sit: Min guard, HOB elevated Sit to supine: Min guard   General bed mobility comments: PT cues to utiize LLE to assist RLE when returning to supine    Transfers Overall transfer level: Needs assistance Equipment used: Rolling walker (2 wheels) Transfers: Sit to/from Stand Sit to Stand: From elevated surface, Min assist           General transfer comment: verbal cues for hand placement, tactile cues for increased anterior lean. increased time    Ambulation/Gait Ambulation/Gait assistance: Min guard Gait Distance (Feet): 30 Feet Assistive device: Rolling walker (2 wheels) Gait Pattern/deviations: Step-to pattern, Decreased stance time - right, Decreased step length - left Gait velocity: reduced Gait velocity interpretation: <1.31 ft/sec, indicative of household ambulator   General Gait Details: pt with slowed step-to gait, reduced weight shift to RLE with L foot drag initially, pt demonstrates improvement in foot clearance with increased ambulation distance   Stairs             Wheelchair Mobility    Modified Rankin (Stroke Patients Only)       Balance Overall balance assessment: Needs assistance Sitting-balance support: No upper extremity supported, Feet supported Sitting balance-Leahy Scale: Good     Standing balance support: Bilateral upper extremity supported, Reliant on assistive device for balance Standing balance-Leahy Scale: Poor                              Cognition Arousal/Alertness: Awake/alert Behavior During Therapy: WFL for tasks assessed/performed Overall Cognitive Status: Within Functional Limits for tasks assessed  Exercises      General Comments General comments  (skin integrity, edema, etc.): VSS on RA      Pertinent Vitals/Pain Pain Assessment Pain Assessment: Faces Faces Pain Scale: Hurts even more Pain Location: R knee Pain Descriptors / Indicators: Grimacing Pain Intervention(s): Monitored during session    Home Living                          Prior Function            PT Goals (current goals can now be found in the care plan section) Acute Rehab PT Goals Patient Stated Goal: to walk better Progress towards PT goals: Progressing toward goals    Frequency    Min 3X/week      PT Plan Current plan remains appropriate    Co-evaluation              AM-PAC PT "6 Clicks" Mobility   Outcome Measure  Help needed turning from your back to your side while in a flat bed without using bedrails?: A Little Help needed moving from lying on your back to sitting on the side of a flat bed without using bedrails?: A Little Help needed moving to and from a bed to a chair (including a wheelchair)?: A Little Help needed standing up from a chair using your arms (e.g., wheelchair or bedside chair)?: A Little Help needed to walk in hospital room?: A Little Help needed climbing 3-5 steps with a railing? : Total 6 Click Score: 16    End of Session   Activity Tolerance: Patient tolerated treatment well Patient left: in bed;with call bell/phone within reach;with bed alarm set Nurse Communication: Mobility status PT Visit Diagnosis: Unsteadiness on feet (R26.81);Muscle weakness (generalized) (M62.81);Difficulty in walking, not elsewhere classified (R26.2);Pain Pain - Right/Left: Right Pain - part of body: Knee     Time: [id number] PT Time Calculation (min) (ACUTE ONLY): 29 min  Charges:  $Gait Training: 8-22 mins $Therapeutic Activity: 8-22 mins                     Zenaida Niece, PT, DPT Acute Rehabilitation Office [phone]    Zenaida Niece 01/05/2022, 10:55 AM

## 2022-01-05 NOTE — Progress Notes (Signed)
Patient will have RN call RT if patient decides he wants to wear CPAP tonight.  Patient's VSS on RA.

## 2022-01-06 ENCOUNTER — Other Ambulatory Visit (HOSPITAL_COMMUNITY): Payer: Self-pay

## 2022-01-06 LAB — CBC
HCT: 27.3 % — ABNORMAL LOW (ref 39.0–52.0)
Hemoglobin: 9.4 g/dL — ABNORMAL LOW (ref 13.0–17.0)
MCH: 24 pg — ABNORMAL LOW (ref 26.0–34.0)
MCHC: 34.4 g/dL (ref 30.0–36.0)
MCV: 69.6 fL — ABNORMAL LOW (ref 80.0–100.0)
Platelets: 193 10*3/uL (ref 150–400)
RBC: 3.92 MIL/uL — ABNORMAL LOW (ref 4.22–5.81)
RDW: 14.4 % (ref 11.5–15.5)
WBC: 8.7 10*3/uL (ref 4.0–10.5)
nRBC: 0.2 % (ref 0.0–0.2)

## 2022-01-06 LAB — CULTURE, BLOOD (ROUTINE X 2)
Culture: NO GROWTH
Special Requests: ADEQUATE

## 2022-01-06 LAB — BASIC METABOLIC PANEL
Anion gap: 7 (ref 5–15)
BUN: 49 mg/dL — ABNORMAL HIGH (ref 8–23)
CO2: 19 mmol/L — ABNORMAL LOW (ref 22–32)
Calcium: 9.3 mg/dL (ref 8.9–10.3)
Chloride: 109 mmol/L (ref 98–111)
Creatinine, Ser: 1.56 mg/dL — ABNORMAL HIGH (ref 0.61–1.24)
GFR, Estimated: 50 mL/min — ABNORMAL LOW (ref >60)
Glucose, Bld: 134 mg/dL — ABNORMAL HIGH (ref 70–99)
Potassium: 5.3 mmol/L — ABNORMAL HIGH (ref 3.5–5.1)
Sodium: 135 mmol/L (ref 135–145)

## 2022-01-06 MED ORDER — AMOXICILLIN 500 MG PO CAPS
1000.0000 mg | ORAL_CAPSULE | Freq: Three times a day (TID) | ORAL | Status: DC
  Administered 2022-01-06: 1000 mg via ORAL
  Filled 2022-01-06: qty 2

## 2022-01-06 MED ORDER — FUROSEMIDE 40 MG PO TABS
40.0000 mg | ORAL_TABLET | Freq: Two times a day (BID) | ORAL | Status: DC
  Administered 2022-01-06 (×2): 40 mg via ORAL
  Filled 2022-01-06 (×2): qty 1

## 2022-01-06 MED ORDER — FERROUS SULFATE 325 (65 FE) MG PO TBEC: 325.0000 mg | DELAYED_RELEASE_TABLET | ORAL | 3 refills | Status: DC

## 2022-01-06 MED ORDER — AMOXICILLIN 500 MG PO CAPS
1000.0000 mg | ORAL_CAPSULE | Freq: Three times a day (TID) | ORAL | 0 refills | Status: DC
  Filled 2022-01-06: qty 60, 10d supply, fill #0

## 2022-01-06 MED ORDER — OXYCODONE HCL 5 MG PO TABS
5.0000 mg | ORAL_TABLET | Freq: Three times a day (TID) | ORAL | 0 refills | Status: DC | PRN
  Filled 2022-01-06: qty 15, 5d supply, fill #0

## 2022-01-06 MED ORDER — OXYCODONE HCL 5 MG PO TABS
5.0000 mg | ORAL_TABLET | Freq: Three times a day (TID) | ORAL | 0 refills | Status: DC | PRN
  Filled 2022-01-06: qty 12, 4d supply, fill #0

## 2022-01-06 NOTE — Care Management Important Message (Signed)
Important Message  Patient Details  Name: Milas Schappell MRN: [id number] Date of Birth: Jan 14, 1961   Medicare Important Message Given:  Yes     Shelda Altes 01/06/2022, 10:38 AM

## 2022-01-06 NOTE — Discharge Summary (Signed)
Physician Discharge Summary   Patient: [name] MRN: [id number] DOB: 04-Feb-1961  Admit date:     01/01/2022  Discharge date: 01/06/22  Discharge Physician: Edwin Dada   PCP: [name], MD     Recommendations at discharge:  Follow up with PCP Dr. Joya Gaskins in 1 week Dr. Joya Gaskins: Please obtain BMP and CBC in 5 days and restart ARB, spironolactone if K normalized Follow up with Orthopedics Dr. Percell Miller in 5-7 days for gout flare Follow up with [name] on Oct 25 for aortic stenosis Dr. Joya Gaskins: Please recheck iron level in 4-6 weeks and refer to GI for iron deficiency anemia if indicated      Discharge Diagnoses: Principal Problem:   Sepsis due to group A Streptococcus with acute renal failure without septic shock ([place]) Active Problems:   HTN (hypertension)   Chronic diastolic heart failure (HCC)   Stage 3a chronic kidney disease (CKD) ([place])   OSA (obstructive sleep apnea)   Persistent atrial fibrillation (HCC)   AKI (acute kidney injury) ([place])   Obesity (BMI 30-39.9)   Iron deficiency anemia   Gout of right knee      Hospital Course: [name] is a 61 y.o. M with dCHF, AF on Eliquis, CKD IIIb baseline 1.3-1.4, chronic pain, HTN, HLD, and OSA not on CPAP who presented with weakness, fever and rigors.  Found to have group A strep bacteremia and gout of the right knee.      * Sepsis due to group A Streptococcus with acute renal failure without septic shock ([place]) Presented with leukocytosis, fever, and acute kidney injury, Cr >2.  Blood cultures growing group A streptococcus.   ID consulted.  Recent echo without vegetation.  Treated with penicillin and linezolid added for toxin suppression.  Transitioned to oral amoxicillin and ID recommended 14 days total therapy.       Gout of right knee Patient developed severe pain and swelling of the right knee prior to admission.  There was some consideration of septic arthritis and the joint was aspirated, but  this had MSU only and culture negative.    Repeat aspiration done on 10/4 and injection of steroid performed.    Given carvedilol and renal function, colchicine contraindicated.  Given few days prednisone before instillation of steroids, which should be adequate treatment.  Improving symptoms and able to transfer, take stairs, and ambulate 150 feet with PT on day of discharge.     Has close follow up   Iron deficiency anemia Hemoglobin ~9 g/dL, no blood loss.  Microcytic and iron saturation low.  Given IV iron.    Recommend CBC in 1 week, oral iron and follow up with PCP       AKI (acute kidney injury) ([place]) 2.4 on admission, improved with fluids.  Resolved to baseline    Persistent atrial fibrillation (HCC) HR normalized.  Aortic stenosis [name] reviewed case, recommended outpatient follow up Oct 25 after resolution of bacteremia.  Stage 3a chronic kidney disease (CKD) (HCC) Baseline 1.3-1.4.  CKD IIIb ruled out.     Chronic diastolic heart failure (HCC) Appears euvolemic.  Acute heart failure ruled out. Diuretic resumed at discharge but spironolactone and ArB held given hyperkalemia            The [place] was reviewed for this patient prior to discharge.   Consultants: Orthopedics, Infectious disease Procedures performed: Knee aspiration x2  Disposition: Home health, patient declined Diet recommendation:  Discharge Diet Orders (From  admission, onward)     Start     Ordered   01/06/22 0000  Diet - low sodium heart healthy        01/06/22 1236             DISCHARGE MEDICATION: Allergies as of 01/06/2022       Reactions   Adhesive [tape] Other (See Comments)   Makes the skin feel as if it is burning, will also bruise the skin. Pt. prefers paper tape   Latex Hives, Itching, Other (See Comments)   Burns skin, also        Medication List     STOP taking these medications    dapagliflozin propanediol 10 MG  Tabs tablet Commonly known as: Farxiga   olmesartan 40 MG tablet Commonly known as: BENICAR   potassium chloride SA 20 MEQ tablet Commonly known as: KLOR-CON M   spironolactone 50 MG tablet Commonly known as: ALDACTONE       TAKE these medications    acetaminophen 500 MG tablet Commonly known as: TYLENOL Take 500-1,000 mg by mouth every 6 (six) hours as needed for moderate pain.   allopurinol 100 MG tablet Commonly known as: ZYLOPRIM TAKE 1 TABLET (100 MG TOTAL) BY MOUTH DAILY. What changed: how much to take   amoxicillin 500 MG capsule Commonly known as: AMOXIL Take 2 capsules (1,000 mg total) by mouth every 8 (eight) hours for 10 days.   apixaban 5 MG Tabs tablet Commonly known as: ELIQUIS Take 1 tablet (5 mg total) by mouth 2 (two) times daily.   aspirin EC 81 MG tablet Take 81 mg by mouth daily. Swallow whole.   atorvastatin 20 MG tablet Commonly known as: LIPITOR Take 20 mg by mouth daily.   carvedilol 25 MG tablet Commonly known as: COREG Take 1 tablet (25 mg total) by mouth 2 (two) times daily with a meal.   cloNIDine 0.2 MG tablet Commonly known as: CATAPRES Take 1 tablet (0.2 mg total) by mouth 3 (three) times daily.   ferrous sulfate 325 (65 FE) MG EC tablet Take 1 tablet (325 mg total) by mouth every other day.   furosemide 40 MG tablet Commonly known as: LASIX Take 1 tablet (40 mg total) by mouth 3 (three) times daily.   gabapentin 400 MG capsule Commonly known as: NEURONTIN TAKE 2 CAPSULES(800 MG) BY MOUTH THREE TIMES DAILY What changed:  how much to take how to take this when to take this additional instructions   hydrALAZINE 50 MG tablet Commonly known as: APRESOLINE Take 50 mg by mouth 3 (three) times daily.   magnesium oxide 400 MG tablet Commonly known as: MAG-OX Take 1 tablet (400 mg total) by mouth every other day.   omeprazole 40 MG capsule Commonly known as: PRILOSEC Take 1 capsule (40 mg total) by mouth daily.    oxyCODONE 5 MG immediate release tablet Commonly known as: Oxy IR/ROXICODONE Take 1 tablet (5 mg total) by mouth every 8 (eight) hours as needed for severe pain.               Durable Medical Equipment  (From admission, onward)           Start     Ordered   01/06/22 1358  For home use only DME Walker rolling  Once       Question Answer Comment  Walker: With 5 Inch Wheels   Patient needs a walker to treat with the following condition Weakness  01/06/22 1357            Follow-up Information     [facility] Follow up.   Why: The office will call. Contact information: [place] [address] [place] [phone]        Renette Butters, MD. Schedule an appointment as soon as possible for a visit in 5 day(s).   Specialty: Orthopedic Surgery Contact information: [address] [place] [address] [phone]         Gaye Pollack, MD. Go to.   Specialty: Cardiothoracic Surgery Why: Wednesday 10/25 @ 12pm Contact information: [place] [address] [phone]         [facility] Follow up.   Why: rolling walker Contact information: [address] [place] [address] [phone]                 Discharge Instructions     Diet - low sodium heart healthy   Complete by: As directed    Discharge instructions   Complete by: As directed    You were admitted for a severe infection (you had "group A streptococcus" bacteria in your bloodstream) You were treated with antibiotics Penicillin  You should continue antibiotics for 10 more days, taking amoxicillin 1000 mg twice daily starting tonight You were seen here by an Infectious Disease specialist Dr. Juleen China and an Orthopedic doctor Dr. Percell Miller  Dr. Percell Miller saw your for your gout You had a joint injection that should resolve the gout itself Work with  physical therapy Go see Dr. Percell Miller **in 5-7 days** Call his office (see below in the To Do section) to schedule that appointment.  See him early next week  We spoke with [name], your cardiothoracic surgeon while you were here.   You have an appointment with him coming up on Wednesday 10/25 @ 12pm  Lastly, it is important that you go see your primary care doctor, Dr. Joya Gaskins early next week For now, DO NOT TAKE your potassium supplement, your [name], your olmesartan or your spironolactone Have Dr. Joya Gaskins check your labs and restart these at that time   Increase activity slowly   Complete by: As directed        Discharge Exam: Filed Weights   01/04/22 0500 01/05/22 0221 01/06/22 0427  Weight: 97.7 kg 94.4 kg 94.3 kg    General: Pt is alert, awake, not in acute distress Cardiovascular: RRR, nl S1-S2, no murmurs appreciated.   No LE edema.   Respiratory: Normal respiratory rate and rhythm.  CTAB without rales or wheezes. Abdominal: Abdomen soft and non-tender.  No distension or HSM.   MSK: There is right knee effusion, persistent. Neuro/Psych: Strength symmetric in upper and lower extremities.  Judgment and insight appear normal.   Condition at discharge: good  The results of significant diagnostics from this hospitalization (including imaging, microbiology, ancillary and laboratory) are listed below for reference.   Imaging Studies: CT KNEE RIGHT WO CONTRAST  Result Date: 01/02/2022 CLINICAL DATA:  Soft tissue infection suspected. EXAM: CT OF THE RIGHT KNEE WITHOUT CONTRAST CT OF THE RIGHT TIBIA AND FIBULA WITHOUT CONTRAST TECHNIQUE: Multidetector CT imaging of the right knee was performed according to the standard protocol. Multiplanar CT image reconstructions were also generated. Multidetector CT imaging of the right tibia and fibula was performed according to the standard protocol. Multiplanar CT image reconstructions were also generated. RADIATION DOSE REDUCTION:  This exam  was performed according to the departmental dose-optimization program which includes automated exposure control, adjustment of the mA and/or kV according to patient size and/or use of iterative reconstruction technique. COMPARISON:  Right knee and right tibia and fibula radiographs 01/01/2022 FINDINGS: RIGHT KNEE: Bones/Joint/Cartilage Mild-to-moderate medial compartment joint space narrowing. Moderate superior and lateral and mild inferior medial patellar degenerative osteophytes. Mild to moderate chronic enthesopathic changes at the quadriceps insertion on the patella. No acute fracture or dislocation. No cortical erosion is seen. Ligaments Suboptimally assessed by CT. Muscles and Tendons Normal density and size of the regional musculature. Soft tissues Large joint effusion. Moderate atherosclerotic calcifications. -- RIGHT TIBIA AND FIBULA: Bones/Joint/Cartilage There is diffuse decreased bone mineralization. The tibia and fibula cortices appear intact. No acute fracture. Ligaments Suboptimally assessed by CT. Muscles and Tendons Normal density and size of the regional musculature. Soft tissues Mild edema and swelling of the anteromedial, anterolateral, and lateral aspects of the right calf subcutaneous fat. There are dense atherosclerotic calcifications. Varicose veins are incidentally noted within the medial calf. IMPRESSION: 1. Large knee joint effusion. 2. Mild-to-moderate chronic enthesopathic changes at the quadriceps insertion on the patella. 3. Mild-to-moderate medial compartment and mild patellofemoral compartment osteoarthritis of the knee. 4. There is mild subcutaneous fat edema and swelling throughout the anterior right knee and calf. This is compatible with cellulitis. No cortical erosion is seen to indicate CT evidence of acute osteomyelitis. Electronically Signed   By: Yvonne Kendall M.D.   On: 01/02/2022 12:50   CT TIBIA FIBULA RIGHT WO CONTRAST  Result Date: 01/02/2022 CLINICAL DATA:  Soft  tissue infection suspected. EXAM: CT OF THE RIGHT KNEE WITHOUT CONTRAST CT OF THE RIGHT TIBIA AND FIBULA WITHOUT CONTRAST TECHNIQUE: Multidetector CT imaging of the right knee was performed according to the standard protocol. Multiplanar CT image reconstructions were also generated. Multidetector CT imaging of the right tibia and fibula was performed according to the standard protocol. Multiplanar CT image reconstructions were also generated. RADIATION DOSE REDUCTION: This exam was performed according to the departmental dose-optimization program which includes automated exposure control, adjustment of the mA and/or kV according to patient size and/or use of iterative reconstruction technique. COMPARISON:  Right knee and right tibia and fibula radiographs 01/01/2022 FINDINGS: RIGHT KNEE: Bones/Joint/Cartilage Mild-to-moderate medial compartment joint space narrowing. Moderate superior and lateral and mild inferior medial patellar degenerative osteophytes. Mild to moderate chronic enthesopathic changes at the quadriceps insertion on the patella. No acute fracture or dislocation. No cortical erosion is seen. Ligaments Suboptimally assessed by CT. Muscles and Tendons Normal density and size of the regional musculature. Soft tissues Large joint effusion. Moderate atherosclerotic calcifications. -- RIGHT TIBIA AND FIBULA: Bones/Joint/Cartilage There is diffuse decreased bone mineralization. The tibia and fibula cortices appear intact. No acute fracture. Ligaments Suboptimally assessed by CT. Muscles and Tendons Normal density and size of the regional musculature. Soft tissues Mild edema and swelling of the anteromedial, anterolateral, and lateral aspects of the right calf subcutaneous fat. There are dense atherosclerotic calcifications. Varicose veins are incidentally noted within the medial calf. IMPRESSION: 1. Large knee joint effusion. 2. Mild-to-moderate chronic enthesopathic changes at the quadriceps insertion on  the patella. 3. Mild-to-moderate medial compartment and mild patellofemoral compartment osteoarthritis of the knee. 4. There is mild subcutaneous fat edema and swelling throughout the anterior right knee and calf. This is compatible with cellulitis. No cortical erosion is seen to indicate CT evidence of acute osteomyelitis. Electronically Signed   By: Viann Fish.D.  On: 01/02/2022 12:50   DG Tibia/Fibula Right  Result Date: 01/01/2022 CLINICAL DATA:  [id number] weakness and pain EXAM: RIGHT KNEE - 1-2 VIEW; RIGHT TIBIA AND FIBULA - 2 VIEW COMPARISON:  November 17, 2019 FINDINGS: No acute fracture or dislocation. Enthesopathic changes of the quadriceps tendon insertion on the patella. Mild degenerative changes of the patellofemoral compartment. No area of erosion or osseous destruction. No unexpected radiopaque foreign body. Vascular calcifications. Moderate to large knee joint effusion. IMPRESSION: 1. Moderate to large knee joint effusion. 2. Otherwise no acute osseous abnormality. Electronically Signed   By: Valentino Saxon M.D.   On: 01/01/2022 21:01   DG Knee 1-2 Views Right  Result Date: 01/01/2022 CLINICAL DATA:  [id number] weakness and pain EXAM: RIGHT KNEE - 1-2 VIEW; RIGHT TIBIA AND FIBULA - 2 VIEW COMPARISON:  November 17, 2019 FINDINGS: No acute fracture or dislocation. Enthesopathic changes of the quadriceps tendon insertion on the patella. Mild degenerative changes of the patellofemoral compartment. No area of erosion or osseous destruction. No unexpected radiopaque foreign body. Vascular calcifications. Moderate to large knee joint effusion. IMPRESSION: 1. Moderate to large knee joint effusion. 2. Otherwise no acute osseous abnormality. Electronically Signed   By: Valentino Saxon M.D.   On: 01/01/2022 21:01   CT HEAD WO CONTRAST (5MM)  Result Date: 01/01/2022 CLINICAL DATA:  Mental status change. EXAM: CT HEAD WITHOUT CONTRAST TECHNIQUE: Contiguous axial images were obtained from the base of  the skull through the vertex without intravenous contrast. RADIATION DOSE REDUCTION: This exam was performed according to the departmental dose-optimization program which includes automated exposure control, adjustment of the mA and/or kV according to patient size and/or use of iterative reconstruction technique. COMPARISON:  04/20/2015 FINDINGS: Brain: No evidence of acute infarction, hemorrhage, hydrocephalus, extra-axial collection or mass lesion/mass effect. There is mild diffuse low-attenuation within the subcortical and periventricular white matter compatible with chronic microvascular disease. Vascular: No hyperdense vessel or unexpected calcification. Skull: Normal. Negative for fracture or focal lesion. Sinuses/Orbits: Paranasal sinuses and mastoid air cells are clear. Other: None. IMPRESSION: 1. No acute intracranial abnormalities. 2. Chronic microvascular disease Electronically Signed   By: Kerby Moors M.D.   On: 01/01/2022 05:14   DG CHEST PORT 1 VIEW  Result Date: 01/01/2022 CLINICAL DATA:  Weakness and confusion. EXAM: PORTABLE CHEST 1 VIEW COMPARISON:  06/18/2021. FINDINGS: Heart is enlarged and the mediastinal contour stable. Atherosclerotic calcification of the aorta is noted. Lung volumes are low resulting in vascular crowding. Mild airspace disease is present at the left lung base. No effusion or pneumothorax. No acute osseous abnormality. IMPRESSION: 1. Cardiomegaly. 2. Low lung volumes with patchy airspace disease at the left lung base, possible atelectasis or infiltrate. Electronically Signed   By: Brett Fairy M.D.   On: 01/01/2022 01:14   ECHOCARDIOGRAM COMPLETE  Result Date: 12/28/2021    ECHOCARDIOGRAM REPORT   Patient Name:   JAMAREON Web Date of Exam: 12/28/2021 Medical Rec #:  [id number]  Height:       69.0 in Accession #:    [id number] Weight:       213.0 lb Date of Birth:  09-28-1960  BSA:          2.122 m Patient Age:    89 years   BP:           128/72 mmHg Patient Gender: M           HR:           82 bpm. Exam Location:  Outpatient Procedure: 2D Echo, 3D Echo, Cardiac Doppler and Color Doppler Indications:    I35.0 Aortic Stenosis  History:        Patient has prior history of Echocardiogram examinations, most                 recent 03/18/2021. CHF, PAD, Aortic Valve Disease,                 Arrythmias:Atrial Fibrillation, Signs/Symptoms:Dyspnea, Edema                 and Fatigue; Risk Factors:Sleep Apnea, Hypertension and                 Dyslipidemia. Severe Aortic Stenosis (Prior Mean 62mmHG).  Sonographer:    Deliah Boston RDCS Referring Phys: [place]  1. Left ventricular ejection fraction, by estimation, is 60 to 65%. The left ventricle has normal function. The left ventricle has no regional wall motion abnormalities. There is severe concentric left ventricular hypertrophy. Left ventricular diastolic  function could not be evaluated. Elevated left atrial pressure.  2. Right ventricular systolic function is normal. The right ventricular size is normal. There is mildly elevated pulmonary artery systolic pressure.  3. Left atrial size was severely dilated.  4. Right atrial size was mildly dilated.  5. The mitral valve is normal in structure. Mild mitral valve regurgitation. No evidence of mitral stenosis.  6. The aortic valve is tricuspid. Aortic valve regurgitation is mild. Moderate to severe aortic valve stenosis.  7. Aortic dilatation noted. There is mild dilatation of the ascending aorta, measuring 40 mm.  8. The inferior vena cava is normal in size with greater than 50% respiratory variability, suggesting right atrial pressure of 3 mmHg. FINDINGS  Left Ventricle: Left ventricular ejection fraction, by estimation, is 60 to 65%. The left ventricle has normal function. The left ventricle has no regional wall motion abnormalities. The left ventricular internal cavity size was normal in size. There is  severe concentric left ventricular hypertrophy. Left  ventricular diastolic function could not be evaluated due to atrial fibrillation. Left ventricular diastolic function could not be evaluated. Elevated left atrial pressure. Right Ventricle: The right ventricular size is normal. Right ventricular systolic function is normal. There is mildly elevated pulmonary artery systolic pressure. The tricuspid regurgitant velocity is 3.22 m/s, and with an assumed right atrial pressure of 3 mmHg, the estimated right ventricular systolic pressure is [id number]. Left Atrium: Left atrial size was severely dilated. Right Atrium: Right atrial size was mildly dilated. Pericardium: There is no evidence of pericardial effusion. Mitral Valve: The mitral valve is normal in structure. There is mild calcification of the mitral valve leaflet(s). Mild mitral valve regurgitation. No evidence of mitral valve stenosis. Tricuspid Valve: The tricuspid valve is normal in structure. Tricuspid valve regurgitation is mild . No evidence of tricuspid stenosis. Aortic Valve: The aortic valve is tricuspid. Aortic valve regurgitation is mild. Moderate to severe aortic stenosis is present. Aortic valve mean gradient measures 29.0 mmHg. Aortic valve peak gradient measures 60.0 mmHg. Aortic valve area, by VTI measures 0.97 cm. Pulmonic Valve: The pulmonic valve was normal in structure. Pulmonic valve regurgitation is trivial. No evidence of pulmonic stenosis. Aorta: Aortic dilatation noted. There is mild dilatation of the ascending aorta, measuring 40 mm. Venous: The inferior vena cava is normal in size with greater than 50% respiratory variability, suggesting right atrial pressure of 3 mmHg. IAS/Shunts: No atrial level shunt detected by color flow Doppler.  LEFT  VENTRICLE PLAX 2D LVIDd:         3.90 cm   Diastology LVIDs:         2.10 cm   LV e' medial:    6.59 cm/s LV PW:         1.55 cm   LV E/e' medial:  16.1 LV IVS:        1.75 cm   LV e' lateral:   10.60 cm/s LVOT diam:     2.10 cm   LV E/e' lateral:  10.0 LV SV:         74 LV SV Index:   35 LVOT Area:     3.46 cm                           3D Volume EF:                          3D EF:        68 %                          LV EDV:       125 ml                          LV ESV:       40 ml                          LV SV:        85 ml RIGHT VENTRICLE RV Basal diam:  3.40 cm RV S prime:     11.50 cm/s TAPSE (M-mode): 1.4 cm LEFT ATRIUM              Index        RIGHT ATRIUM           Index LA diam:        5.50 cm  2.59 cm/m   RA Area:     26.20 cm LA Vol (A2C):   192.0 ml 90.48 ml/m  RA Volume:   81.00 ml  38.17 ml/m LA Vol (A4C):   174.0 ml 82.00 ml/m LA Biplane Vol: 188.0 ml 88.59 ml/m  AORTIC VALVE AV Area (Vmax):    0.84 cm AV Area (Vmean):   0.95 cm AV Area (VTI):     0.97 cm AV Vmax:           387.20 cm/s AV Vmean:          238.200 cm/s AV VTI:            0.758 m AV Peak Grad:      60.0 mmHg AV Mean Grad:      29.0 mmHg LVOT Vmax:         94.15 cm/s LVOT Vmean:        65.500 cm/s LVOT VTI:          0.213 m LVOT/AV VTI ratio: 0.28  AORTA Ao Root diam: 3.70 cm Ao Asc diam:  4.00 cm MITRAL VALVE                TRICUSPID VALVE MV Area (PHT): cm          TR Peak grad:   41.5 mmHg MV Decel Time: 253 msec     TR Vmax:  322.00 cm/s MR Peak grad: 144.5 mmHg MR Mean grad: 94.0 mmHg     SHUNTS MR Vmax:      601.00 cm/s   Systemic VTI:  0.21 m MR Vmean:     453.0 cm/s    Systemic Diam: 2.10 cm MV E velocity: 106.00 cm/s Olga MillersBrian Crenshaw MD Electronically signed by Olga MillersBrian Crenshaw MD Signature Date/Time: 12/28/2021/4:38:36 PM    Final    SLEEP STUDY DOCUMENTS  Result Date: 12/17/2021 Ordered by an unspecified provider.   Microbiology: Results for orders placed or performed during the hospital encounter of 01/01/22  Culture, blood (Routine x 2)     Status: Abnormal   Collection Time: 01/01/22 12:45 AM   Specimen: BLOOD LEFT ARM  Result Value Ref Range Status   Specimen Description BLOOD LEFT ARM  Final   Special Requests   Final    BOTTLES DRAWN  AEROBIC AND ANAEROBIC Blood Culture adequate volume   Culture  Setup Time   Final    GRAM POSITIVE COCCI IN CHAINS AEROBIC BOTTLE ONLY CRITICAL RESULT CALLED TO, READ BACK BY AND VERIFIED WITH: PHARMD C AMEND [id number] FCP    Culture (A)  Final    GROUP A STREP (S.PYOGENES) ISOLATED HEALTH DEPARTMENT NOTIFIED Performed at [facility], [address] [place], [place] [id number]    Report Status 01/03/2022 FINAL  Final   Organism ID, Bacteria GROUP A STREP (S.PYOGENES) ISOLATED  Final      Susceptibility   Group a strep (s.pyogenes) isolated - MIC*    PENICILLIN <=0.06 SENSITIVE Sensitive     CEFTRIAXONE <=0.12 SENSITIVE Sensitive     ERYTHROMYCIN <=0.12 SENSITIVE Sensitive     LEVOFLOXACIN 0.5 SENSITIVE Sensitive     VANCOMYCIN 0.5 SENSITIVE Sensitive     * GROUP A STREP (S.PYOGENES) ISOLATED  Blood Culture ID Panel (Reflexed)     Status: Abnormal   Collection Time: 01/01/22 12:45 AM  Result Value Ref Range Status   Enterococcus faecalis NOT DETECTED NOT DETECTED Final   Enterococcus Faecium NOT DETECTED NOT DETECTED Final   Listeria monocytogenes NOT DETECTED NOT DETECTED Final   Staphylococcus species NOT DETECTED NOT DETECTED Final   Staphylococcus aureus (BCID) NOT DETECTED NOT DETECTED Final   Staphylococcus epidermidis NOT DETECTED NOT DETECTED Final   Staphylococcus lugdunensis NOT DETECTED NOT DETECTED Final   Streptococcus species DETECTED (A) NOT DETECTED Final    Comment: CRITICAL RESULT CALLED TO, READ BACK BY AND VERIFIED WITH: PHARMD C AMEND 1855 [id number] FCP    Streptococcus agalactiae NOT DETECTED NOT DETECTED Final   Streptococcus pneumoniae NOT DETECTED NOT DETECTED Final   Streptococcus pyogenes DETECTED (A) NOT DETECTED Final    Comment: CRITICAL RESULT CALLED TO, READ BACK BY AND VERIFIED WITH: PHARMD C AMEND 1855 [phone] FCP    A.calcoaceticus-baumannii NOT DETECTED NOT DETECTED Final   Bacteroides fragilis NOT DETECTED NOT DETECTED Final    Enterobacterales NOT DETECTED NOT DETECTED Final   Enterobacter cloacae complex NOT DETECTED NOT DETECTED Final   Escherichia coli NOT DETECTED NOT DETECTED Final   Klebsiella aerogenes NOT DETECTED NOT DETECTED Final   Klebsiella oxytoca NOT DETECTED NOT DETECTED Final   Klebsiella pneumoniae NOT DETECTED NOT DETECTED Final   Proteus species NOT DETECTED NOT DETECTED Final   Salmonella species NOT DETECTED NOT DETECTED Final   Serratia marcescens NOT DETECTED NOT DETECTED Final   Haemophilus influenzae NOT DETECTED NOT DETECTED Final   Neisseria meningitidis NOT DETECTED NOT DETECTED Final   Pseudomonas aeruginosa  NOT DETECTED NOT DETECTED Final   Stenotrophomonas maltophilia NOT DETECTED NOT DETECTED Final   Candida albicans NOT DETECTED NOT DETECTED Final   Candida auris NOT DETECTED NOT DETECTED Final   Candida glabrata NOT DETECTED NOT DETECTED Final   Candida krusei NOT DETECTED NOT DETECTED Final   Candida parapsilosis NOT DETECTED NOT DETECTED Final   Candida tropicalis NOT DETECTED NOT DETECTED Final   Cryptococcus neoformans/gattii NOT DETECTED NOT DETECTED Final    Comment: Performed at [facility], [place] [address], [place], [place] [address]  Culture, blood (Routine x 2)     Status: None   Collection Time: 01/01/22  1:00 AM   Specimen: BLOOD RIGHT ARM  Result Value Ref Range Status   Specimen Description BLOOD RIGHT ARM  Final   Special Requests   Final    BOTTLES DRAWN AEROBIC ONLY Blood Culture adequate volume   Culture   Final    NO GROWTH 5 DAYS Performed at [facility], [address], [place], [place] [address]    Report Status 01/06/2022 FINAL  Final  SARS Coronavirus 2 by RT PCR (hospital order, performed in [facility] hospital lab) *cepheid single result test* Anterior Nasal Swab     Status: None   Collection Time: 01/01/22  2:35 AM   Specimen: Anterior Nasal Swab  Result Value Ref Range Status   SARS Coronavirus 2 by RT PCR NEGATIVE NEGATIVE  Final    Comment: (NOTE) SARS-CoV-2 target nucleic acids are NOT DETECTED.  The SARS-CoV-2 RNA is generally detectable in upper and lower respiratory specimens during the acute phase of infection. The lowest concentration of SARS-CoV-2 viral copies this assay can detect is 250 copies / mL. A negative result does not preclude SARS-CoV-2 infection and should not be used as the sole basis for treatment or other patient management decisions.  A negative result may occur with improper specimen collection / handling, submission of specimen other than nasopharyngeal swab, presence of viral mutation(s) within the areas targeted by this assay, and inadequate number of viral copies (<250 copies / mL). A negative result must be combined with clinical observations, patient history, and epidemiological information.  Fact Sheet for Patients:   https://www.patel.info/  Fact Sheet for Healthcare Providers: https://hall.com/  This test is not yet approved or  cleared by the [place] FDA and has been authorized for detection and/or diagnosis of SARS-CoV-2 by FDA under an Emergency Use Authorization (EUA).  This EUA will remain in effect (meaning this test can be used) for the duration of the COVID-19 declaration under Section 564(b)(1) of the Act, 21 U.S.C. section 360bbb-3(b)(1), unless the authorization is terminated or revoked sooner.  Performed at [facility], [place] [address] [place], [place] [address]   Culture, blood (Routine X 2) w Reflex to ID Panel     Status: None (Preliminary result)   Collection Time: 01/02/22 10:21 AM   Specimen: BLOOD LEFT HAND  Result Value Ref Range Status   Specimen Description BLOOD LEFT HAND  Final   Special Requests   Final    BOTTLES DRAWN AEROBIC AND ANAEROBIC Blood Culture adequate volume   Culture   Final    NO GROWTH 4 DAYS Performed at [facility], [place] [address], [place], [place]  [address]    Report Status PENDING  Incomplete  Culture, blood (Routine X 2) w Reflex to ID Panel     Status: None (Preliminary result)   Collection Time: 01/02/22 10:21  AM   Specimen: BLOOD RIGHT HAND  Result Value Ref Range Status   Specimen Description BLOOD RIGHT HAND  Final   Special Requests   Final    BOTTLES DRAWN AEROBIC AND ANAEROBIC Blood Culture adequate volume   Culture   Final    NO GROWTH 4 DAYS Performed at [facility], [address], [place], [place] [address]    Report Status PENDING  Incomplete  Culture, body fluid w Gram Stain-bottle     Status: None (Preliminary result)   Collection Time: 01/02/22  4:49 PM   Specimen: Fluid  Result Value Ref Range Status   Specimen Description FLUID RIGHT KNEE  Final   Special Requests NONE  Final   Culture   Final    NO GROWTH 4 DAYS Performed at [facility], [address], [place], [place] [address]    Report Status PENDING  Incomplete  Gram stain     Status: None   Collection Time: 01/02/22  4:49 PM   Specimen: Fluid  Result Value Ref Range Status   Specimen Description FLUID RIGHT KNEE  Final   Special Requests NONE  Final   Gram Stain   Final    CYTOSPIN SMEAR WBC PRESENT,BOTH PMN AND MONONUCLEAR NO ORGANISMS SEEN Performed at [facility], [address] [place], [place] [address]    Report Status 01/02/2022 FINAL  Final    Labs: CBC: Recent Labs  Lab 01/01/22 0059 01/02/22 0337 01/03/22 0027 01/05/22 0059 01/06/22 0042  WBC 19.9* 13.3* 12.1* 6.7 8.7  NEUTROABS 17.5*  --  9.5*  --   --   HGB 12.1* 10.4* 10.2* 9.4* 9.4*  HCT 37.3* 31.6* 30.3* 26.7* 27.3*  MCV 73.1* 72.1* 71.3* 68.1* 69.6*  PLT 156 120* 122* 155 0000000   Basic Metabolic Panel: Recent Labs  Lab 01/02/22 0337 01/03/22 0027 01/04/22 0035 01/05/22 0059 01/06/22 0042  NA 136 135 135 134* 135  K 4.1 4.1 4.4 5.2* 5.3*  CL 106 107 108 107 109  CO2 21* 19* 18* 19* 19*  GLUCOSE 98 106* 128* 144* 134*  BUN 34* 28* 28*  32* 49*  CREATININE 2.09* 1.80* 1.63* 1.46* 1.56*  CALCIUM 9.0 8.7* 8.8* 9.1 9.3  PHOS  --  2.5  --   --   --    Liver Function Tests: Recent Labs  Lab 01/01/22 0059 01/03/22 0027  AST 21  --   ALT 15  --   ALKPHOS 62  --   BILITOT 1.2  --   PROT 11.3*  --   ALBUMIN 3.7 2.6*   CBG: Recent Labs  Lab 01/01/22 0053 01/02/22 2118  GLUCAP 104* 110*    Discharge time spent: approximately 35 minutes spent on discharge counseling, evaluation of patient on day of discharge, and coordination of discharge planning with nursing, social work, pharmacy and case management  Signed: Edwin Dada, MD [facility] 01/06/2022

## 2022-01-06 NOTE — Progress Notes (Signed)
OT Cancellation Note  Patient Details Name: [name] MRN: [id number] DOB: 1961-04-01   Cancelled Treatment:    Reason Eval/Treat Not Completed: Patient at procedure or test/ unavailable (Currently working with PT). Plan to reattempt.   Tyrone Schimke, OT Acute Rehabilitation Services Office: [phone]   Hortencia Pilar 01/06/2022, 11:36 AM

## 2022-01-06 NOTE — TOC Transition Note (Signed)
2 discharge meds in [place] for patient until discharge

## 2022-01-06 NOTE — Progress Notes (Addendum)
[facility] for Infectious Disease  Date of Admission:  01/01/2022           Reason for visit: Follow up on Strep pyogenes bacteremia   Current antibiotics: Penicillin  ASSESSMENT:    61 y.o. male admitted with:  #Strep pyogenes bacteremia #Right knee gout flare Patient resenting with right knee pain/effusion and strep pyogenes bacteremia.  Blood cultures have cleared as of 01/02/2022.  Patient has received penicillin as well as 3 days of linezolid for toxin suppression (10/1-10/3).  There was concern for septic arthritis of the right knee as well.  He underwent aspiration on 01/02/2022 with 46,200 WBCs (90% neutrophils).  Gram stain and cultures have been negative.  Crystals were noted on his synovial fluid analysis and he has been treated as well for presumptive gout with colchicine and prednisone.  Status post repeat aspiration 01/04/2022 with bloody fluid evacuated.  He also received Depo-Medrol and Marcaine injection at that time.  His knee pain has been improving. Of note, patient had similar presentation in January 2019 with left knee pain status post synovial fluid aspiration with 40,111 WBCs and monosodium urate crystals noted at that time.  He was treated with a gout flare at that presentation also.  #Chronic kidney disease stage III Initially presented with AKI which has resolved and creatinine clearance back to baseline.  #Peripheral arterial disease and chronic venous insufficiency Patient has followed with vascular surgery previously and underwent intervention of the left lower extremity in July 2023.  RECOMMENDATIONS:    Will transition to amoxicillin 1 g every 8 hours PO Given that septic arthritis at this time seems less likely and presentation more so consistent with gout flare, will treat for 2 weeks from negative blood cultures through 01/16/2022 Appreciate orthopedic and physical therapy follow-up Will sign off for the time being, please call as  needed   Principal Problem:   Sepsis due to group A Streptococcus with acute renal failure without septic shock ([place]) Active Problems:   HTN (hypertension)   Chronic diastolic heart failure (HCC)   Stage 3a chronic kidney disease (CKD) (HCC)   OSA (obstructive sleep apnea)   Persistent atrial fibrillation (HCC)   AKI (acute kidney injury) ([place])   Obesity (BMI 30-39.9)   Iron deficiency anemia   Gout of right knee    MEDICATIONS:    Scheduled Meds:  allopurinol  100 mg Oral Daily   apixaban  5 mg Oral BID   aspirin EC  81 mg Oral Daily   atorvastatin  20 mg Oral Daily   bupivacaine(PF)  5 mL Intra-articular Once   carvedilol  25 mg Oral BID WC   cloNIDine  0.2 mg Oral TID   colchicine  0.6 mg Oral Daily   docusate sodium  100 mg Oral BID   gabapentin  800 mg Oral TID   hydrALAZINE  50 mg Oral TID   lidocaine (PF)  5 mL Intradermal Once   magnesium oxide  400 mg Oral QODAY   methylPREDNISolone acetate  40 mg Intra-articular Once   predniSONE  40 mg Oral Q breakfast   sodium chloride flush  3 mL Intravenous Q12H   Continuous Infusions:  methocarbamol (ROBAXIN) IV Stopped (01/03/22 0227)   penicillin G potassium 12 Million Units in dextrose 5 % 500 mL continuous infusion 12 Million Units (01/06/22 0025)   PRN Meds:.acetaminophen **OR** acetaminophen, albuterol, bisacodyl, guaiFENesin, hydrALAZINE, methocarbamol (ROBAXIN) IV, methocarbamol, morphine injection, ondansetron **OR** ondansetron (ZOFRAN) IV, oxyCODONE, polyethylene glycol  SUBJECTIVE:   24 hour events:  There are no acute events that have been noted overnight WBC remains normalized at this morning Renal function is overall stable Potassium 5.3 No changes with current micro Afebrile with a Tmax of 97.4  Patient seen this morning with his wife at the bedside.  He reports that he is going to try and work further with physical therapy today.  His right knee symptoms have improved.  He has no fevers.  Review  of Systems  All other systems reviewed and are negative.     OBJECTIVE:   Blood pressure (!) 133/95, pulse 64, temperature (!) 97.3 F (36.3 C), temperature source Oral, resp. rate 18, height 5\' 9"  (1.753 m), weight 94.3 kg, SpO2 97 %. Body mass index is 30.7 kg/m.  Physical Exam Constitutional:      Appearance: Normal appearance.  Musculoskeletal:     Cervical back: Normal range of motion and neck supple.     Comments: His right knee remains somewhat swollen however his range of motion is much improved today  Skin:    Comments: Bilateral venous stasis changes  Neurological:     General: No focal deficit present.     Mental Status: He is alert and oriented to person, place, and time.  Psychiatric:        Mood and Affect: Mood normal.        Behavior: Behavior normal.      Lab Results: Lab Results  Component Value Date   WBC 8.7 01/06/2022   HGB 9.4 (L) 01/06/2022   HCT 27.3 (L) 01/06/2022   MCV 69.6 (L) 01/06/2022   PLT 193 01/06/2022    Lab Results  Component Value Date   NA 135 01/06/2022   K 5.3 (H) 01/06/2022   CO2 19 (L) 01/06/2022   GLUCOSE 134 (H) 01/06/2022   BUN 49 (H) 01/06/2022   CREATININE 1.56 (H) 01/06/2022   CALCIUM 9.3 01/06/2022   GFRNONAA 50 (L) 01/06/2022   GFRAA 69 04/29/2020    Lab Results  Component Value Date   ALT 15 01/01/2022   AST 21 01/01/2022   ALKPHOS 62 01/01/2022   BILITOT 1.2 01/01/2022       Component Value Date/Time   CRP 1.6 (H) 03/18/2014 2341       Component Value Date/Time   ESRSEDRATE 3 03/18/2014 2341     I have reviewed the micro and lab results in Epic.  Imaging: No results found.   Imaging independently reviewed in Epic.    [facility] [phone] pager 01/06/2022, 8:11 AM

## 2022-01-06 NOTE — Progress Notes (Signed)
Physical Therapy Treatment Patient Details Name: [name] MRN: [id number] DOB: [birth year] Today's Date: 01/06/2022   History of Present Illness Pt is a 61 year old man admitted on 01/01/22 with fever, SOB, LE pain and shaking with difficulty walking due to sepsis and PNA. Admitted with cellulitis, right knee pain/effusion, and strep pyogenes bacteremia s/p aspiration of the right knee with orthopedic surgery 01/02/2022 with 46,200 WBCs (90% neutrophils) and crystals also noted. PMH: HTN, CHF, HLD, gout, CKD, afib, PAD, aortic stenosis, cellulitis, chronic back pain.    PT Comments    Pt tolerates treatment well, ambulating for increased distances with increased gait speed. Pt is able to progress to stair negotiation and demonstrates improved transfer quality this session. PT recommends performing R knee ROM exercise in the morning prior to attempting to get out of bed in an effort to reduce stiffness and to improve transfer quality. PT recommends discharge home with HHPT.  Recommendations for follow up therapy are one component of a multi-disciplinary discharge planning process, led by the attending physician.  Recommendations may be updated based on patient status, additional functional criteria and insurance authorization.  Follow Up Recommendations  Home health PT     Assistance Recommended at Discharge Intermittent Supervision/Assistance  Patient can return home with the following A little help with bathing/dressing/bathroom;Help with stairs or ramp for entrance;Assist for transportation;Assistance with cooking/housework   Equipment Recommendations  Rolling walker (2 wheels)    Recommendations for Other Services       Precautions / Restrictions Precautions Precautions: Fall;Other (comment) Precaution Comments: right knee swelling Restrictions Weight Bearing Restrictions: No     Mobility  Bed Mobility                    Transfers Overall transfer level: Needs  assistance Equipment used: Rolling walker (2 wheels) Transfers: Sit to/from Stand, Bed to chair/wheelchair/BSC Sit to Stand: Min guard, Supervision   Step pivot transfers: Supervision       General transfer comment: minG from bed, supervision from recliner or bedside commode with armrests    Ambulation/Gait Ambulation/Gait assistance: Min guard Gait Distance (Feet): 150 Feet Assistive device: Rolling walker (2 wheels) Gait Pattern/deviations: Step-through pattern Gait velocity: reduced Gait velocity interpretation: <1.8 ft/sec, indicate of risk for recurrent falls   General Gait Details: pt with slowed step-through gait, reduced stride length, pt reports increased effort to clear R foot   Stairs Stairs: Yes Stairs assistance: Min guard Stair Management: One rail Right, Sideways Number of Stairs: 3     Wheelchair Mobility    Modified Rankin (Stroke Patients Only)       Balance Overall balance assessment: Needs assistance Sitting-balance support: No upper extremity supported, Feet supported Sitting balance-Leahy Scale: Good     Standing balance support: Single extremity supported, Bilateral upper extremity supported, Reliant on assistive device for balance Standing balance-Leahy Scale: Poor                              Cognition Arousal/Alertness: Awake/alert Behavior During Therapy: WFL for tasks assessed/performed Overall Cognitive Status: Within Functional Limits for tasks assessed                                          Exercises      General Comments General comments (skin integrity, edema, etc.): tachy into 120s with mobility  Pertinent Vitals/Pain Pain Assessment Pain Assessment: Faces Faces Pain Scale: Hurts little more Pain Location: R knee Pain Descriptors / Indicators: Sore Pain Intervention(s): Monitored during session    Home Living                          Prior Function            PT  Goals (current goals can now be found in the care plan section) Acute Rehab PT Goals Patient Stated Goal: to walk better Progress towards PT goals: Progressing toward goals    Frequency    Min 3X/week      PT Plan Current plan remains appropriate    Co-evaluation              AM-PAC PT "6 Clicks" Mobility   Outcome Measure  Help needed turning from your back to your side while in a flat bed without using bedrails?: A Little Help needed moving from lying on your back to sitting on the side of a flat bed without using bedrails?: A Little Help needed moving to and from a bed to a chair (including a wheelchair)?: A Little Help needed standing up from a chair using your arms (e.g., wheelchair or bedside chair)?: A Little Help needed to walk in hospital room?: A Little Help needed climbing 3-5 steps with a railing? : A Little 6 Click Score: 18    End of Session   Activity Tolerance: Patient tolerated treatment well Patient left: in bed;with call bell/phone within reach;with bed alarm set Nurse Communication: Mobility status PT Visit Diagnosis: Unsteadiness on feet (R26.81);Muscle weakness (generalized) (M62.81);Difficulty in walking, not elsewhere classified (R26.2);Pain Pain - Right/Left: Right Pain - part of body: Knee     Time: 1105-1140 PT Time Calculation (min) (ACUTE ONLY): 35 min  Charges:  $Gait Training: 8-22 mins $Therapeutic Activity: 8-22 mins                     Zenaida Niece, PT, DPT Acute Rehabilitation Office [phone]    Zenaida Niece 01/06/2022, 11:47 AM

## 2022-01-06 NOTE — Progress Notes (Signed)
Progress Note   Patient: [name] DGL:[id number] DOB: 07-13-1960 DOA: 01/01/2022     5 DOS: the patient was seen and examined on 01/06/2022 at 8:40AM      Brief hospital course: Mr. Laverdure is a 61 y.o. M with dCHF, AF on Eliquis, CKD IIIb baseline 1.3-1.4, chronic pain, HTN, HLD, and OSA not on CPAP who presented with weakness, fever and rigors.  Found to have group A strep bacteremia and gout of the right knee.   9/30: Admitted on antibiotics 10/1: Blood cultures growing GAS, ID consulted; Ortho consulted for swollen knee, aspirate done --> MSU crystals 10/3: Knee aspirated again 10/5: ID switched to amoxicillin, end date 10/15      Assessment and Plan: * Sepsis due to group A Streptococcus with acute renal failure without septic shock ([place]) Sepsis physiology resolved - Transition to amoxicillin with end date 10/15   Gout of right knee S/p aspiration on 10/1 and 10/3.   Swelling unchanged but pain improved. Worked with PT yesterday - Continue colchicine and prednisone - Continue chronic allopurinol  Persistent atrial fibrillation (HCC) Heart rate normalized.  - Continue Coreg - Continue Eliquis  Hyperkalemia - Hold spironolactone and ARB - Follow up with PCP  Chronic diastolic heart failure (HCC) Appears euvolemic.  Acute heart failure ruled out. - Continue Carvedilol - Resume furosemide - Hold Farxiga, spironolactone, olmesartan for least 1 more day given AKI  Iron deficiency anemia - Oral iron at discharge - GI follow-up at discharge    Obesity (BMI 30-39.9) BMI 32  AKI (acute kidney injury) ([place]) 2.4 on admission, improved with fluids.  Resolved to baseline today. - Hold diuretics and ARB 1 more day - Trend BMP  OSA (obstructive sleep apnea) - CPAP at night  Stage 3a chronic kidney disease (CKD) (HCC) Baseline 1.3-1.4.  CKD IIIb ruled out.     HTN (hypertension) BP normal - Continue Coreg, clonidine, hydralazine - Resume furosemide - Continue  to hold olmesartan, furosemide, spironolactone          Subjective: Still some pain in the right leg and knee, but improving overall, no fever, no confusion, no chest symptoms or dyspnea.    Physical Exam: BP (!) 144/105 (BP Location: Right Arm)   Pulse (!) 108   Temp (!) 97.2 F (36.2 C) (Oral)   Resp 18   Ht 5\' 9"  (1.753 m)   Wt 94.3 kg   SpO2 98%   BMI 30.70 kg/m   Adult male, lying in bed, no acute distress, appears tired Heart rate normal, irregular rhythm, no murmurs, no lower extremity edema, no JVD Respiratory rate normal, lungs clear without rales or wheezes Abdomen soft without tenderness palpation or guarding, no ascites or distention The right knee is still: Notable effusion, no redness or warmth Attention normal, affect appropriate, judgment and insight appear normal, face symmetric, speech fluent, moves upper extremities with generalized weakness but symmetric strength    Data Reviewed: Infectious disease notes reviewed Hemogram shows white blood cell count normal, hemoglobin 9.4, stable Basic metabolic panel shows creatinine stable at 1.5, potassium up to 5.3  Family Communication: Wife sleeping at the bedside    Disposition: Status is: Inpatient Patient was admitted with strep bacteremia, also developed a gout flare.  He is medically ready for discharge, although the wife disputes PT recommendation for home health, and so we are unable to discharge today        Author: Edwin Dada, MD 01/06/2022 10:51 AM  For on call  review [email or web address].

## 2022-01-06 NOTE — TOC Transition Note (Addendum)
Transition of Care [facility]) - CM/SW Discharge Note   Patient Details  Name: [name] MRN: [id number] Date of Birth: [birth year]  Transition of Care [facility]) CM/SW Contact:  Zenon Mayo, RN Phone Number: 01/06/2022, 1:54 PM   Clinical Narrative:    NCM spoke with patient at the bedside, offered choice for HHPT/HHOT with Medicare .gov list, he states he wants to hold off on this right now because he and his wife lost their home  to mold and they are staying with his BIL right now and they are getting ready to move into a hotel at the [facility] hotel on [address].  NCM informed him that the therapy can still be done at the hotel for him, he states he just wants to hold off on the therapy for now.  He states he does need the rolling walker, he is ok with [employer] supplying this for him. NCM ordered rolling walker thru [employer] for patient.  He states his wife will be transporting him home at dc. He states he just bought a new Lucianne Lei for his family.   Also he states he had his dentures in the top drawer in the room and now he can not find them.  He states his wife will look thru his book bag when she gets her to see if they were placed in there. Wife would like for patient to get the [place], [place], NCM offered choice to her  with Medicare. Gov list, she states she does not have a preferenc, NCM made referral to [facility] with [place]. He states he is able to take referral. Soc will begin 24 to 48 hrs post dc. NCM informed [name] to call wife, [name] at [phone] to set up apts.   Final next level of care: Home/Self Care Barriers to Discharge: No Barriers Identified   Patient Goals and CMS Choice Patient states their goals for this hospitalization and ongoing recovery are:: return home CMS Medicare.gov Compare Post Acute Care list provided to:: Patient Choice offered to / list presented to : Patient  Discharge Placement                       Discharge Plan and Services                DME  Arranged: Walker rolling DME Agency: [employer] Date DME Agency Contacted: 01/06/22 Time DME Agency Contacted: [phone] Representative spoke with at DME Agency: Thedore Mins HH Arranged: PT, OT, Refused HH          Social Determinants of Health ([place]) Interventions     Readmission Risk Interventions     No data to display

## 2022-01-07 ENCOUNTER — Telehealth: Payer: Self-pay

## 2022-01-07 LAB — CULTURE, BLOOD (ROUTINE X 2)
Culture: NO GROWTH
Culture: NO GROWTH
Special Requests: ADEQUATE
Special Requests: ADEQUATE

## 2022-01-07 LAB — CULTURE, BODY FLUID W GRAM STAIN -BOTTLE: Culture: NO GROWTH

## 2022-01-07 NOTE — Patient Outreach (Signed)
  Care Coordination TOC Note Transition Care Management Unsuccessful Follow-up Telephone Call  Date of discharge and from where:  01/06/22-[place]   Attempts:  1st Attempt  Reason for unsuccessful TCM follow-up call:  Unable to leave message   Enzo Montgomery, RN,BSN,CCM [place] Management Telephonic Care Management Coordinator Direct Phone: [phone] Toll Free: [phone] Fax: [phone]

## 2022-01-10 ENCOUNTER — Telehealth: Payer: Self-pay

## 2022-01-10 NOTE — Patient Outreach (Signed)
  Care Coordination TOC Note Transition Care Management Follow-up Telephone Call Date of discharge and from where: 01/06/2022-[place] How have you been since you were released from the hospital? Patient pleased to report that he has been doing so much better since returning home. He states getting back home has helped him recover and feel better. He has regained strength and moving around-feels like he is at baseline.  Any questions or concerns? No  Items Reviewed: Did the pt receive and understand the discharge instructions provided? Yes  Medications obtained and verified? No -Patient currently not at home-does not have meds with him Other? No  Any new allergies since your discharge? No  Dietary orders reviewed? Yes Do you have support at home? Yes   Home Care and Equipment/Supplies: Were home health services ordered? yes If so, what is the name of the agency? [facility]  Has the agency set up a time to come to the patient's home? No-patient declined RN CM to follow up-states he no longer wants or needs [facility] services Were any new equipment or medical supplies ordered?  Yes: rolling walker What is the name of the medical supply agency? [employer] Were you able to get the supplies/equipment? yes Do you have any questions related to the use of the equipment or supplies? No-Patient reports he has not needed to use DME.  Functional Questionnaire: (I = Independent and D = Dependent) ADLs: I  Bathing/Dressing- I  Meal Prep- I  Eating- I  Maintaining continence- I  Transferring/Ambulation- I  Managing Meds- Assist  Follow up appointments reviewed:  PCP Hospital f/u appt confirmed? No  Patient has not called and made appt yet. [facility] f/u appt confirmed? Yes  Scheduled to see Cardiac MD on 01/26/22 @ 12N. Patient needs to call and scheduled appt with ortho MD-states wife will do it Are transportation arrangements needed? No  If their condition worsens, is the pt aware to  call PCP or go to the Emergency Dept.? Yes Was the patient provided with contact information for the PCP's office or ED? Yes Was to pt encouraged to call back with questions or concerns? Yes  SDOH assessments and interventions completed:   Yes  Care Coordination Interventions Activated:  Yes   Care Coordination Interventions:  PCP follow up appointment requested   Encounter Outcome:  Pt. Visit Completed    Enzo Montgomery, RN,BSN,CCM [place] Management Telephonic Care Management Coordinator Direct Phone: [phone] Toll Free: [phone] Fax: [phone]

## 2022-01-12 ENCOUNTER — Emergency Department (HOSPITAL_COMMUNITY): Payer: Medicare Other

## 2022-01-12 ENCOUNTER — Encounter (HOSPITAL_COMMUNITY): Payer: Self-pay | Admitting: Emergency Medicine

## 2022-01-12 ENCOUNTER — Other Ambulatory Visit: Payer: Self-pay

## 2022-01-12 ENCOUNTER — Emergency Department (HOSPITAL_COMMUNITY)
Admission: EM | Admit: 2022-01-12 | Discharge: 2022-01-13 | Disposition: A | Discharge status: Home / Self Care | Payer: Medicare Other | Source: Home / Self Care | Attending: Emergency Medicine | Admitting: Emergency Medicine

## 2022-01-12 DIAGNOSIS — L03116 Cellulitis of left lower limb: Secondary | ICD-10-CM | POA: Diagnosis not present

## 2022-01-12 DIAGNOSIS — I080 Rheumatic disorders of both mitral and aortic valves: Secondary | ICD-10-CM | POA: Diagnosis not present

## 2022-01-12 DIAGNOSIS — M79604 Pain in right leg: Secondary | ICD-10-CM | POA: Diagnosis not present

## 2022-01-12 DIAGNOSIS — M25461 Effusion, right knee: Secondary | ICD-10-CM | POA: Diagnosis not present

## 2022-01-12 DIAGNOSIS — N179 Acute kidney failure, unspecified: Secondary | ICD-10-CM | POA: Diagnosis not present

## 2022-01-12 DIAGNOSIS — M1A9XX1 Chronic gout, unspecified, with tophus (tophi): Secondary | ICD-10-CM | POA: Diagnosis not present

## 2022-01-12 DIAGNOSIS — R9431 Abnormal electrocardiogram [ECG] [EKG]: Secondary | ICD-10-CM | POA: Diagnosis not present

## 2022-01-12 DIAGNOSIS — I130 Hypertensive heart and chronic kidney disease with heart failure and stage 1 through stage 4 chronic kidney disease, or unspecified chronic kidney disease: Secondary | ICD-10-CM | POA: Diagnosis not present

## 2022-01-12 DIAGNOSIS — I7772 Dissection of iliac artery: Secondary | ICD-10-CM | POA: Diagnosis not present

## 2022-01-12 DIAGNOSIS — R238 Other skin changes: Secondary | ICD-10-CM | POA: Insufficient documentation

## 2022-01-12 DIAGNOSIS — I771 Stricture of artery: Secondary | ICD-10-CM | POA: Diagnosis not present

## 2022-01-12 DIAGNOSIS — M1711 Unilateral primary osteoarthritis, right knee: Secondary | ICD-10-CM | POA: Diagnosis not present

## 2022-01-12 DIAGNOSIS — R7989 Other specified abnormal findings of blood chemistry: Secondary | ICD-10-CM | POA: Insufficient documentation

## 2022-01-12 DIAGNOSIS — A419 Sepsis, unspecified organism: Secondary | ICD-10-CM | POA: Diagnosis not present

## 2022-01-12 DIAGNOSIS — R6521 Severe sepsis with septic shock: Secondary | ICD-10-CM | POA: Diagnosis not present

## 2022-01-12 DIAGNOSIS — M1990 Unspecified osteoarthritis, unspecified site: Secondary | ICD-10-CM | POA: Diagnosis not present

## 2022-01-12 DIAGNOSIS — I959 Hypotension, unspecified: Secondary | ICD-10-CM | POA: Diagnosis not present

## 2022-01-12 DIAGNOSIS — R600 Localized edema: Secondary | ICD-10-CM | POA: Insufficient documentation

## 2022-01-12 DIAGNOSIS — Z79899 Other long term (current) drug therapy: Secondary | ICD-10-CM | POA: Insufficient documentation

## 2022-01-12 DIAGNOSIS — I509 Heart failure, unspecified: Secondary | ICD-10-CM | POA: Diagnosis not present

## 2022-01-12 DIAGNOSIS — Z7982 Long term (current) use of aspirin: Secondary | ICD-10-CM | POA: Insufficient documentation

## 2022-01-12 DIAGNOSIS — I745 Embolism and thrombosis of iliac artery: Secondary | ICD-10-CM | POA: Diagnosis not present

## 2022-01-12 DIAGNOSIS — I723 Aneurysm of iliac artery: Secondary | ICD-10-CM | POA: Diagnosis not present

## 2022-01-12 DIAGNOSIS — Z7901 Long term (current) use of anticoagulants: Secondary | ICD-10-CM | POA: Insufficient documentation

## 2022-01-12 DIAGNOSIS — M009 Pyogenic arthritis, unspecified: Secondary | ICD-10-CM | POA: Diagnosis not present

## 2022-01-12 DIAGNOSIS — R0602 Shortness of breath: Secondary | ICD-10-CM | POA: Diagnosis not present

## 2022-01-12 DIAGNOSIS — I1A0 Resistant hypertension: Secondary | ICD-10-CM | POA: Diagnosis not present

## 2022-01-12 DIAGNOSIS — I4819 Other persistent atrial fibrillation: Secondary | ICD-10-CM | POA: Diagnosis not present

## 2022-01-12 DIAGNOSIS — R6520 Severe sepsis without septic shock: Secondary | ICD-10-CM | POA: Diagnosis not present

## 2022-01-12 DIAGNOSIS — I10 Essential (primary) hypertension: Secondary | ICD-10-CM | POA: Insufficient documentation

## 2022-01-12 DIAGNOSIS — L03115 Cellulitis of right lower limb: Secondary | ICD-10-CM | POA: Diagnosis not present

## 2022-01-12 DIAGNOSIS — I4891 Unspecified atrial fibrillation: Secondary | ICD-10-CM | POA: Insufficient documentation

## 2022-01-12 DIAGNOSIS — M7989 Other specified soft tissue disorders: Secondary | ICD-10-CM | POA: Insufficient documentation

## 2022-01-12 DIAGNOSIS — Z91040 Latex allergy status: Secondary | ICD-10-CM | POA: Insufficient documentation

## 2022-01-12 DIAGNOSIS — K551 Chronic vascular disorders of intestine: Secondary | ICD-10-CM | POA: Diagnosis not present

## 2022-01-12 DIAGNOSIS — I77810 Thoracic aortic ectasia: Secondary | ICD-10-CM | POA: Diagnosis not present

## 2022-01-12 DIAGNOSIS — M7731 Calcaneal spur, right foot: Secondary | ICD-10-CM | POA: Diagnosis not present

## 2022-01-12 DIAGNOSIS — I739 Peripheral vascular disease, unspecified: Secondary | ICD-10-CM | POA: Diagnosis not present

## 2022-01-12 DIAGNOSIS — E7800 Pure hypercholesterolemia, unspecified: Secondary | ICD-10-CM | POA: Diagnosis not present

## 2022-01-12 DIAGNOSIS — I6782 Cerebral ischemia: Secondary | ICD-10-CM | POA: Diagnosis not present

## 2022-01-12 DIAGNOSIS — R2240 Localized swelling, mass and lump, unspecified lower limb: Secondary | ICD-10-CM | POA: Diagnosis not present

## 2022-01-12 DIAGNOSIS — N189 Chronic kidney disease, unspecified: Secondary | ICD-10-CM | POA: Diagnosis not present

## 2022-01-12 DIAGNOSIS — E1122 Type 2 diabetes mellitus with diabetic chronic kidney disease: Secondary | ICD-10-CM | POA: Diagnosis not present

## 2022-01-12 DIAGNOSIS — I472 Ventricular tachycardia, unspecified: Secondary | ICD-10-CM | POA: Diagnosis not present

## 2022-01-12 DIAGNOSIS — I5032 Chronic diastolic (congestive) heart failure: Secondary | ICD-10-CM | POA: Diagnosis not present

## 2022-01-12 DIAGNOSIS — A400 Sepsis due to streptococcus, group A: Secondary | ICD-10-CM | POA: Diagnosis not present

## 2022-01-12 DIAGNOSIS — J439 Emphysema, unspecified: Secondary | ICD-10-CM | POA: Diagnosis not present

## 2022-01-12 DIAGNOSIS — D631 Anemia in chronic kidney disease: Secondary | ICD-10-CM | POA: Diagnosis not present

## 2022-01-12 DIAGNOSIS — I700 Atherosclerosis of aorta: Secondary | ICD-10-CM | POA: Diagnosis not present

## 2022-01-12 DIAGNOSIS — I517 Cardiomegaly: Secondary | ICD-10-CM | POA: Diagnosis not present

## 2022-01-12 DIAGNOSIS — I350 Nonrheumatic aortic (valve) stenosis: Secondary | ICD-10-CM | POA: Diagnosis not present

## 2022-01-12 DIAGNOSIS — R0989 Other specified symptoms and signs involving the circulatory and respiratory systems: Secondary | ICD-10-CM | POA: Diagnosis not present

## 2022-01-12 DIAGNOSIS — D689 Coagulation defect, unspecified: Secondary | ICD-10-CM | POA: Diagnosis not present

## 2022-01-12 DIAGNOSIS — N1832 Chronic kidney disease, stage 3b: Secondary | ICD-10-CM | POA: Diagnosis not present

## 2022-01-12 NOTE — ED Triage Notes (Addendum)
Pt reported to ED with c/o swelling to RLE. Pt states he just noticed swelling this evening. Denies shortness of breath. Pt acknowledges missing PM dose of BP medication.

## 2022-01-12 NOTE — ED Provider Triage Note (Signed)
Emergency Medicine Provider Triage Evaluation Note  [name] , a 61 y.o. male  was evaluated in triage.  Pt complains of right leg swelling.  Patient reports associated shortness of breath.  Patient history of congestive heart failure.  Patient reports that he was recently admitted for a septic knee.  Patient reports blistering to the right lower extremity that he noticed today.  No redness or fever.  Is on Xarelto.  Review of Systems  Positive: Right leg swelling, shortness of breath Negative: Chest pain  Physical Exam  BP (!) 172/124   Pulse (!) 48   Temp 98.4 F (36.9 C) (Oral)   Resp 16   SpO2 99%  Gen:   Awake, no distress   Resp:  Normal effort  MSK:   Moves extremities without difficulty  Other:  Patient with lower extremity edema noted.  Patient with bullae present to the right lower extremity.  Today patient does have large effusion to the right knee.  There is no erythema.  Warmth noted.  Medical Decision Making  Medically screening exam initiated at 9:20 PM.  Appropriate orders placed.  [name] was informed that the remainder of the evaluation will be completed by another provider, this initial triage assessment does not replace that evaluation, and the importance of remaining in the ED until their evaluation is complete.  Patient with right leg swelling, shortness of breath.  Patient is on Xarelto.  Labs and imaging pending.   Doristine Devoid, PA-C 01/12/22 2122

## 2022-01-13 ENCOUNTER — Emergency Department (HOSPITAL_BASED_OUTPATIENT_CLINIC_OR_DEPARTMENT_OTHER): Payer: Medicare Other

## 2022-01-13 DIAGNOSIS — M79604 Pain in right leg: Secondary | ICD-10-CM | POA: Diagnosis not present

## 2022-01-13 DIAGNOSIS — M7989 Other specified soft tissue disorders: Secondary | ICD-10-CM | POA: Diagnosis not present

## 2022-01-13 LAB — CBC WITH DIFFERENTIAL/PLATELET
Abs Immature Granulocytes: 0.05 10*3/uL (ref 0.00–0.07)
Basophils Absolute: 0 10*3/uL (ref 0.0–0.1)
Basophils Relative: 1 %
Eosinophils Absolute: 0.1 10*3/uL (ref 0.0–0.5)
Eosinophils Relative: 3 %
HCT: 32.8 % — ABNORMAL LOW (ref 39.0–52.0)
Hemoglobin: 10.6 g/dL — ABNORMAL LOW (ref 13.0–17.0)
Immature Granulocytes: 1 %
Lymphocytes Relative: 26 %
Lymphs Abs: 1.5 10*3/uL (ref 0.7–4.0)
MCH: 24 pg — ABNORMAL LOW (ref 26.0–34.0)
MCHC: 32.3 g/dL (ref 30.0–36.0)
MCV: 74.2 fL — ABNORMAL LOW (ref 80.0–100.0)
Monocytes Absolute: 0.7 10*3/uL (ref 0.1–1.0)
Monocytes Relative: 13 %
Neutro Abs: 3.1 10*3/uL (ref 1.7–7.7)
Neutrophils Relative %: 56 %
Platelets: 315 10*3/uL (ref 150–400)
RBC: 4.42 MIL/uL (ref 4.22–5.81)
RDW: 17.3 % — ABNORMAL HIGH (ref 11.5–15.5)
WBC: 5.5 10*3/uL (ref 4.0–10.5)
nRBC: 0 % (ref 0.0–0.2)

## 2022-01-13 LAB — COMPREHENSIVE METABOLIC PANEL
ALT: 21 U/L (ref 0–44)
AST: 16 U/L (ref 15–41)
Albumin: 3.2 g/dL — ABNORMAL LOW (ref 3.5–5.0)
Alkaline Phosphatase: 67 U/L (ref 38–126)
Anion gap: 11 (ref 5–15)
BUN: 26 mg/dL — ABNORMAL HIGH (ref 8–23)
CO2: 19 mmol/L — ABNORMAL LOW (ref 22–32)
Calcium: 9.4 mg/dL (ref 8.9–10.3)
Chloride: 107 mmol/L (ref 98–111)
Creatinine, Ser: 1.57 mg/dL — ABNORMAL HIGH (ref 0.61–1.24)
GFR, Estimated: 50 mL/min — ABNORMAL LOW (ref >60)
Glucose, Bld: 97 mg/dL (ref 70–99)
Potassium: 3.9 mmol/L (ref 3.5–5.1)
Sodium: 137 mmol/L (ref 135–145)
Total Bilirubin: 0.6 mg/dL (ref 0.3–1.2)
Total Protein: 7.8 g/dL (ref 6.5–8.1)

## 2022-01-13 LAB — LACTIC ACID, PLASMA: Lactic Acid, Venous: 2.4 mmol/L (ref 0.5–1.9)

## 2022-01-13 LAB — TROPONIN I (HIGH SENSITIVITY): Troponin I (High Sensitivity): 16 ng/L (ref <18)

## 2022-01-13 LAB — BRAIN NATRIURETIC PEPTIDE: B Natriuretic Peptide: 352 pg/mL — ABNORMAL HIGH (ref 0.0–100.0)

## 2022-01-13 MED ORDER — AMOXICILLIN 500 MG PO CAPS: 1000.0000 mg | ORAL_CAPSULE | Freq: Three times a day (TID) | ORAL | Status: DC

## 2022-01-13 MED ORDER — LACTATED RINGERS IV BOLUS: 500.0000 mL | Freq: Once | INTRAVENOUS | Status: DC

## 2022-01-13 MED ORDER — HYDRALAZINE HCL 25 MG PO TABS
50.0000 mg | ORAL_TABLET | Freq: Three times a day (TID) | ORAL | Status: DC
  Administered 2022-01-13: 50 mg via ORAL
  Filled 2022-01-13: qty 2

## 2022-01-13 MED ORDER — APIXABAN 5 MG PO TABS
5.0000 mg | ORAL_TABLET | Freq: Two times a day (BID) | ORAL | Status: DC
  Administered 2022-01-13: 5 mg via ORAL
  Filled 2022-01-13: qty 1

## 2022-01-13 MED ORDER — FUROSEMIDE 40 MG PO TABS: 60.0000 mg | ORAL_TABLET | Freq: Three times a day (TID) | ORAL | 3 refills | Status: DC

## 2022-01-13 MED ORDER — CLONIDINE HCL 0.2 MG PO TABS
0.2000 mg | ORAL_TABLET | Freq: Three times a day (TID) | ORAL | Status: DC
  Administered 2022-01-13: 0.2 mg via ORAL
  Filled 2022-01-13: qty 1

## 2022-01-13 MED ORDER — FUROSEMIDE 10 MG/ML IJ SOLN
40.0000 mg | Freq: Once | INTRAMUSCULAR | Status: AC
  Administered 2022-01-13: 40 mg via INTRAVENOUS
  Filled 2022-01-13: qty 4

## 2022-01-13 MED ORDER — CARVEDILOL 12.5 MG PO TABS: 25.0000 mg | ORAL_TABLET | Freq: Two times a day (BID) | ORAL | Status: DC

## 2022-01-13 NOTE — Discharge Instructions (Addendum)
The blistering right lower leg is likely due to too much fluid in your legs, this should eventually fully drain and the skin should reabsorb itself/fall off.  There is no signs of infection. I increased your lasix dose to 60mg  three times daily.  Please make sure to continue taking antibiotics.  Make sure to follow-up with your primary care doctor. Return to the ER for any new or worsening symptoms

## 2022-01-13 NOTE — ED Provider Notes (Addendum)
[facility] EMERGENCY DEPARTMENT Provider Note   CSN: [id number] Arrival date & time: 01/12/22  1916     History  Chief Complaint  Patient presents with   Leg Swelling    [name] is a 61 y.o. male.  HPI 61 year old male with a history of HFpEF with an EF of 60 to 65%, chronic back pain, hypertensive emergency, hypertension, sleep apnea, arthritis just to the ER with concerns for swelling to his right lower leg.  He was recently discharged from the hospital on 10/5 with cellulitis to the right knee.  He has been compliant with his medications including his amoxicillin that he was discharged with.  ID had recommended a 14-day course.  Several days ago he started noticing a "blister" to his right inner leg/ankle area, which burst last night.  It has been draining consistently since.  He denies any known fevers or chills.  He denies any shortness of breath or chest pain.  He states his legs are now more swollen than normal.    Home Medications Prior to Admission medications   Medication Sig Start Date End Date Taking? Authorizing Provider  acetaminophen (TYLENOL) 500 MG tablet Take 500-1,000 mg by mouth every 6 (six) hours as needed for moderate pain.   Yes [provider]  allopurinol (ZYLOPRIM) 100 MG tablet TAKE 1 TABLET (100 MG TOTAL) BY MOUTH DAILY. Patient taking differently: Take 100 mg by mouth daily. 09/30/21 09/30/22 Yes [name], MD  amoxicillin (AMOXIL) 500 MG capsule Take 2 capsules (1,000 mg total) by mouth every 8 (eight) hours for 10 days. 01/06/22 01/16/22 Yes Danford, Suann Larry, MD  apixaban (ELIQUIS) 5 MG TABS tablet Take 1 tablet (5 mg total) by mouth 2 (two) times daily. 11/08/21  Yes [name], MD  aspirin EC 81 MG tablet Take 81 mg by mouth daily. Swallow whole.   Yes [provider]  atorvastatin (LIPITOR) 20 MG tablet Take 20 mg by mouth daily. 12/27/21  Yes [provider]  carvedilol (COREG) 25 MG  tablet Take 1 tablet (25 mg total) by mouth 2 (two) times daily with a meal. 08/23/21 01/13/22 Yes [name], MD  cloNIDine (CATAPRES) 0.2 MG tablet Take 1 tablet (0.2 mg total) by mouth 3 (three) times daily. 11/08/21  Yes [name], MD  FARXIGA 10 MG TABS tablet Take 10 mg by mouth daily. 01/08/22  Yes [provider]  ferrous sulfate 325 (65 FE) MG EC tablet Take 1 tablet (325 mg total) by mouth every other day. 01/06/22 01/06/23 Yes Danford, Suann Larry, MD  furosemide (LASIX) 40 MG tablet Take 1 tablet (40 mg total) by mouth 3 (three) times daily. 09/01/21  Yes Duke, Tami Lin, PA  gabapentin (NEURONTIN) 400 MG capsule TAKE 2 CAPSULES(800 MG) BY MOUTH THREE TIMES DAILY Patient taking differently: Take 800 mg by mouth 3 (three) times daily. 11/08/21  Yes [name], MD  hydrALAZINE (APRESOLINE) 50 MG tablet Take 50 mg by mouth 3 (three) times daily. 11/24/21  Yes [provider]  magnesium oxide (MAG-OX) 400 MG tablet Take 1 tablet (400 mg total) by mouth every other day. 11/09/21  Yes Donato Heinz, MD  olmesartan (BENICAR) 40 MG tablet Take 40 mg by mouth daily.   Yes [provider]  omeprazole (PRILOSEC) 40 MG capsule Take 1 capsule (40 mg total) by mouth daily. 12/07/21  Yes Donato Heinz, MD  oxyCODONE (OXY IR/ROXICODONE) 5 MG immediate release tablet Take  1 tablet (5 mg total) by mouth every 8 (eight) hours as needed for severe pain. 01/06/22  Yes Danford, Suann Larry, MD  potassium chloride SA (KLOR-CON M) 20 MEQ tablet Take 20 mEq by mouth daily.   Yes [provider]      Allergies    Adhesive [tape] and Latex    Review of Systems   Review of Systems Ten systems reviewed and are negative for acute change, except as noted in the HPI.   Physical Exam Updated Vital Signs BP (!) 171/90   Pulse 85   Temp 98.4 F (36.9 C)   Resp 18   SpO2 100%  Physical Exam Vitals and nursing note reviewed.   Constitutional:      General: He is not in acute distress.    Appearance: He is well-developed.  HENT:     Head: Normocephalic and atraumatic.  Eyes:     Conjunctiva/sclera: Conjunctivae normal.  Cardiovascular:     Rate and Rhythm: Normal rate and regular rhythm.     Heart sounds: No murmur heard. Pulmonary:     Effort: Pulmonary effort is normal. No respiratory distress.     Breath sounds: Normal breath sounds.  Abdominal:     Palpations: Abdomen is soft.     Tenderness: There is no abdominal tenderness.  Musculoskeletal:        General: No swelling.     Cervical back: Neck supple.     Right lower leg: Edema present.     Left lower leg: Edema present.     Comments: Right knee with full flexion and extension, mildly edematous but not erythematous, not warm.  2+ lower extremity edema with a bullae/blister to the right inner leg/ankle with active clear drainage.  2+ DP pulses.  Very dry skin with heavy scaling to bilateral lower extremities.  Skin:    General: Skin is warm and dry.     Capillary Refill: Capillary refill takes less than 2 seconds.  Neurological:     Mental Status: He is alert.  Psychiatric:        Mood and Affect: Mood normal.      ED Results / Procedures / Treatments   Labs (all labs ordered are listed, but only abnormal results are displayed) Labs Reviewed  CBC WITH DIFFERENTIAL/PLATELET - Abnormal; Notable for the following components:      Result Value   Hemoglobin 10.6 (*)    HCT 32.8 (*)    MCV 74.2 (*)    MCH 24.0 (*)    RDW 17.3 (*)    All other components within normal limits  COMPREHENSIVE METABOLIC PANEL - Abnormal; Notable for the following components:   CO2 19 (*)    BUN 26 (*)    Creatinine, Ser 1.57 (*)    Albumin 3.2 (*)    GFR, Estimated 50 (*)    All other components within normal limits  BRAIN NATRIURETIC PEPTIDE - Abnormal; Notable for the following components:   B Natriuretic Peptide 352.0 (*)    All other components within  normal limits  LACTIC ACID, PLASMA - Abnormal; Notable for the following components:   Lactic Acid, Venous 2.4 (*)    All other components within normal limits  LACTIC ACID, PLASMA  TROPONIN I (HIGH SENSITIVITY)  TROPONIN I (HIGH SENSITIVITY)    EKG EKG Interpretation  Date/Time:  Thursday January 13 2022 13:21:04 EDT Ventricular Rate:  85 PR Interval:    QRS Duration: 96 QT Interval:  410 QTC Calculation:  487 R Axis:   -55 Text Interpretation: Atrial fibrillation Left axis deviation Septal infarct , age undetermined Abnormal ECG When compared with ECG of 01-Jan-2022 00:57, PREVIOUS ECG IS PRESENT Since last tracing rate slower Confirmed by Isla Pence [phone]) on 01/13/2022 1:40:38 PM  Radiology VAS [place] LOWER EXTREMITY VENOUS (DVT)  Result Date: 01/13/2022  Lower Venous DVT Study Patient Name:  STEBAN MADDEN  Date of Exam:   01/13/2022 Medical Rec #: [id number]   Accession #:    [id number] Date of Birth: Mar 27, 1961   Patient Gender: M Patient Age:   37 years Exam Location:  [facility] Procedure:      VAS [place] LOWER EXTREMITY VENOUS (DVT) Referring Phys: Sharyn Lull --------------------------------------------------------------------------------  Indications: Swelling, skin changes, pain to right leg with blistering of distal calf.  Comparison Study: Extensive arterial history with monophasic runoff and                   calcification bilaterally on most recent examination                   11-25-2021.                    06-18-2021 Prior right lower extremity venous was negative for                   DVT. Performing Technologist: Darlin Coco RDMS, RVT  Examination Guidelines: A complete evaluation includes B-mode imaging, spectral Doppler, color Doppler, and power Doppler as needed of all accessible portions of each vessel. Bilateral testing is considered an integral part of a complete examination. Limited examinations for reoccurring indications may be performed as noted. The reflux  portion of the exam is performed with the patient in reverse Trendelenburg.  +---------+---------------+---------+-----------+----------+--------------+ RIGHT    CompressibilityPhasicitySpontaneityPropertiesThrombus Aging +---------+---------------+---------+-----------+----------+--------------+ CFV      Full           Yes      Yes                                 +---------+---------------+---------+-----------+----------+--------------+ SFJ      Full                                                        +---------+---------------+---------+-----------+----------+--------------+ FV Prox  Full                                                        +---------+---------------+---------+-----------+----------+--------------+ FV Mid   Full                                                        +---------+---------------+---------+-----------+----------+--------------+ FV DistalFull                                                        +---------+---------------+---------+-----------+----------+--------------+  PFV      Full                                                        +---------+---------------+---------+-----------+----------+--------------+ POP      Full           Yes      Yes                                 +---------+---------------+---------+-----------+----------+--------------+ PTV      Full                                                        +---------+---------------+---------+-----------+----------+--------------+ PERO     Full                                                        +---------+---------------+---------+-----------+----------+--------------+ Gastroc  Full                                                        +---------+---------------+---------+-----------+----------+--------------+   +----+---------------+---------+-----------+----------+--------------+  LEFTCompressibilityPhasicitySpontaneityPropertiesThrombus Aging +----+---------------+---------+-----------+----------+--------------+ CFV Full           Yes      Yes                                 +----+---------------+---------+-----------+----------+--------------+     Summary: RIGHT: - There is no evidence of deep vein thrombosis in the lower extremity. Appearance of fluid collection in the joint space on the medial aspect of the knee.  LEFT: - No evidence of common femoral vein obstruction.  *See table(s) above for measurements and observations.    Preliminary    DG Chest 2 View  Result Date: 01/12/2022 CLINICAL DATA:  Shortness of breath EXAM: CHEST - 2 VIEW COMPARISON:  01/01/2022 FINDINGS: Lungs are clear.  No pleural effusion or pneumothorax. Cardiomegaly. Mild degenerative changes of the visualized thoracolumbar spine. IMPRESSION: No evidence of acute cardiopulmonary disease. Cardiomegaly. Electronically Signed   By: Julian Hy M.D.   On: 01/12/2022 21:46    Procedures Procedures    Medications Ordered in ED Medications  amoxicillin (AMOXIL) capsule 1,000 mg (has no administration in time range)  apixaban (ELIQUIS) tablet 5 mg (5 mg Oral Given 01/13/22 1209)  cloNIDine (CATAPRES) tablet 0.2 mg (0.2 mg Oral Given 01/13/22 1210)  hydrALAZINE (APRESOLINE) tablet 50 mg (50 mg Oral Given 01/13/22 1210)  furosemide (LASIX) injection 40 mg (40 mg Intravenous Given 01/13/22 1208)    ED Course/ Medical Decision Making/ A&P                           Medical Decision Making Amount and/or Complexity of  Data Reviewed Labs: ordered.  Risk Prescription drug management.  61 year old male who presents to the ER with concerns for swelling to his right lower extremity.  He is hypertensive on arrival with a blood pressure of 172/124, however patient states that he missed his morning blood pressure medications.  Temp of 99.8 on arrival.  He is on room air.  No chest pain,  shortness of breath, headache, nausea, vomiting.  On exam, he has a bullae to the right medial aspect of his ankle/calf.  No acute signs of infection.  Labs ordered, reviewed and interpreted by me.  CMP without any significant electrode abnormalities, creatinine 1.57 consistent with history of CKD and is at baseline. Hemoglobin of 10.6 at baseline which also appears to be at baseline.  BNP mildly elevated at 352.  Troponin negative.  Chest x-Ren[place]roe without evidence of volume overload. EKG reviewed, none ischemic, rate controlled A-fib which patient reports is chronic.   DVT study ordered, negative for acute DVT.  Given patient was borderline febrile, lactic acid was ordered and this was elevated at 2.4. Wound does not appear to be acutely infected. No leukocytosis, temp rechecked and decreased down to 98.4. Low suspicion for sepsis. Neurovascularly intact. He was given a dose of IV lasix with good response. Suspect bullae from volume overload but no evidence of acute HF exacerbation requiring hospital admission.  Will increase his 40mg  of Lasix TID to 60mg  TID. Patient stable for discharge, reports he has pending follow-up with his PCP.  Return precautions discussed. He voiced understanding and is agreeable. Stable for discharge.    This was a shared visit with my supervising physician Dr. Carolynne Edouard who independently saw and evaluated the patient & provided guidance in evaluation/management/disposition ,in agreement with care   Final Clinical Impression(s) / ED Diagnoses Final diagnoses:  Bullae  Leg swelling    Rx / DC Orders ED Discharge Orders     None             Garald Balding, PA-C 01/13/22 1443    Isla Pence, MD 01/14/22 1553

## 2022-01-13 NOTE — Progress Notes (Signed)
Lower extremity venous right study completed.  Preliminary results relayed to [place], [place].  See CV Proc for preliminary results report.   Darlin Coco, RDMS, RVT

## 2022-01-14 ENCOUNTER — Emergency Department (HOSPITAL_COMMUNITY): Payer: Medicare Other

## 2022-01-14 ENCOUNTER — Ambulatory Visit: Payer: Self-pay | Admitting: *Deleted

## 2022-01-14 ENCOUNTER — Inpatient Hospital Stay (HOSPITAL_COMMUNITY)
Admission: EM | Admit: 2022-01-14 | Discharge: 2022-01-25 | DRG: 853 | Disposition: A | Discharge status: Home Health | Payer: Medicare Other | Attending: Family Medicine | Admitting: Family Medicine

## 2022-01-14 DIAGNOSIS — N1832 Chronic kidney disease, stage 3b: Secondary | ICD-10-CM | POA: Diagnosis present

## 2022-01-14 DIAGNOSIS — I2510 Atherosclerotic heart disease of native coronary artery without angina pectoris: Secondary | ICD-10-CM | POA: Diagnosis present

## 2022-01-14 DIAGNOSIS — I745 Embolism and thrombosis of iliac artery: Secondary | ICD-10-CM | POA: Diagnosis not present

## 2022-01-14 DIAGNOSIS — B957 Other staphylococcus as the cause of diseases classified elsewhere: Secondary | ICD-10-CM | POA: Diagnosis present

## 2022-01-14 DIAGNOSIS — I6782 Cerebral ischemia: Secondary | ICD-10-CM | POA: Diagnosis not present

## 2022-01-14 DIAGNOSIS — E7800 Pure hypercholesterolemia, unspecified: Secondary | ICD-10-CM | POA: Diagnosis present

## 2022-01-14 DIAGNOSIS — I5032 Chronic diastolic (congestive) heart failure: Secondary | ICD-10-CM | POA: Diagnosis present

## 2022-01-14 DIAGNOSIS — X58XXXA Exposure to other specified factors, initial encounter: Secondary | ICD-10-CM | POA: Diagnosis present

## 2022-01-14 DIAGNOSIS — M1A9XX1 Chronic gout, unspecified, with tophus (tophi): Secondary | ICD-10-CM | POA: Diagnosis present

## 2022-01-14 DIAGNOSIS — I080 Rheumatic disorders of both mitral and aortic valves: Secondary | ICD-10-CM | POA: Diagnosis present

## 2022-01-14 DIAGNOSIS — I951 Orthostatic hypotension: Secondary | ICD-10-CM | POA: Diagnosis present

## 2022-01-14 DIAGNOSIS — R9431 Abnormal electrocardiogram [ECG] [EKG]: Secondary | ICD-10-CM | POA: Diagnosis not present

## 2022-01-14 DIAGNOSIS — M25461 Effusion, right knee: Secondary | ICD-10-CM | POA: Diagnosis not present

## 2022-01-14 DIAGNOSIS — A419 Sepsis, unspecified organism: Secondary | ICD-10-CM

## 2022-01-14 DIAGNOSIS — G8929 Other chronic pain: Secondary | ICD-10-CM | POA: Diagnosis present

## 2022-01-14 DIAGNOSIS — Z825 Family history of asthma and other chronic lower respiratory diseases: Secondary | ICD-10-CM

## 2022-01-14 DIAGNOSIS — N179 Acute kidney failure, unspecified: Secondary | ICD-10-CM | POA: Diagnosis present

## 2022-01-14 DIAGNOSIS — I959 Hypotension, unspecified: Secondary | ICD-10-CM | POA: Diagnosis not present

## 2022-01-14 DIAGNOSIS — M1990 Unspecified osteoarthritis, unspecified site: Secondary | ICD-10-CM | POA: Diagnosis present

## 2022-01-14 DIAGNOSIS — Z7901 Long term (current) use of anticoagulants: Secondary | ICD-10-CM

## 2022-01-14 DIAGNOSIS — I739 Peripheral vascular disease, unspecified: Secondary | ICD-10-CM | POA: Diagnosis present

## 2022-01-14 DIAGNOSIS — R0989 Other specified symptoms and signs involving the circulatory and respiratory systems: Secondary | ICD-10-CM | POA: Diagnosis not present

## 2022-01-14 DIAGNOSIS — I1A0 Resistant hypertension: Secondary | ICD-10-CM | POA: Diagnosis present

## 2022-01-14 DIAGNOSIS — M549 Dorsalgia, unspecified: Secondary | ICD-10-CM | POA: Diagnosis present

## 2022-01-14 DIAGNOSIS — Z841 Family history of disorders of kidney and ureter: Secondary | ICD-10-CM

## 2022-01-14 DIAGNOSIS — D631 Anemia in chronic kidney disease: Secondary | ICD-10-CM | POA: Diagnosis present

## 2022-01-14 DIAGNOSIS — I878 Other specified disorders of veins: Secondary | ICD-10-CM | POA: Diagnosis present

## 2022-01-14 DIAGNOSIS — I700 Atherosclerosis of aorta: Secondary | ICD-10-CM | POA: Diagnosis not present

## 2022-01-14 DIAGNOSIS — L03115 Cellulitis of right lower limb: Secondary | ICD-10-CM | POA: Diagnosis present

## 2022-01-14 DIAGNOSIS — G4730 Sleep apnea, unspecified: Secondary | ICD-10-CM | POA: Diagnosis present

## 2022-01-14 DIAGNOSIS — I493 Ventricular premature depolarization: Secondary | ICD-10-CM | POA: Diagnosis present

## 2022-01-14 DIAGNOSIS — L03116 Cellulitis of left lower limb: Secondary | ICD-10-CM | POA: Diagnosis present

## 2022-01-14 DIAGNOSIS — M609 Myositis, unspecified: Secondary | ICD-10-CM | POA: Diagnosis present

## 2022-01-14 DIAGNOSIS — I130 Hypertensive heart and chronic kidney disease with heart failure and stage 1 through stage 4 chronic kidney disease, or unspecified chronic kidney disease: Secondary | ICD-10-CM | POA: Diagnosis present

## 2022-01-14 DIAGNOSIS — S80821A Blister (nonthermal), right lower leg, initial encounter: Secondary | ICD-10-CM | POA: Diagnosis present

## 2022-01-14 DIAGNOSIS — M1711 Unilateral primary osteoarthritis, right knee: Secondary | ICD-10-CM | POA: Diagnosis not present

## 2022-01-14 DIAGNOSIS — L299 Pruritus, unspecified: Secondary | ICD-10-CM | POA: Diagnosis present

## 2022-01-14 DIAGNOSIS — T45515A Adverse effect of anticoagulants, initial encounter: Secondary | ICD-10-CM | POA: Diagnosis present

## 2022-01-14 DIAGNOSIS — I723 Aneurysm of iliac artery: Secondary | ICD-10-CM | POA: Diagnosis not present

## 2022-01-14 DIAGNOSIS — E1122 Type 2 diabetes mellitus with diabetic chronic kidney disease: Secondary | ICD-10-CM | POA: Diagnosis present

## 2022-01-14 DIAGNOSIS — K219 Gastro-esophageal reflux disease without esophagitis: Secondary | ICD-10-CM | POA: Diagnosis present

## 2022-01-14 DIAGNOSIS — G4733 Obstructive sleep apnea (adult) (pediatric): Secondary | ICD-10-CM | POA: Diagnosis present

## 2022-01-14 DIAGNOSIS — I7772 Dissection of iliac artery: Secondary | ICD-10-CM | POA: Diagnosis present

## 2022-01-14 DIAGNOSIS — D689 Coagulation defect, unspecified: Secondary | ICD-10-CM | POA: Diagnosis present

## 2022-01-14 DIAGNOSIS — Z79899 Other long term (current) drug therapy: Secondary | ICD-10-CM

## 2022-01-14 DIAGNOSIS — I771 Stricture of artery: Secondary | ICD-10-CM | POA: Diagnosis not present

## 2022-01-14 DIAGNOSIS — I77810 Thoracic aortic ectasia: Secondary | ICD-10-CM | POA: Diagnosis present

## 2022-01-14 DIAGNOSIS — A400 Sepsis due to streptococcus, group A: Principal | ICD-10-CM | POA: Diagnosis present

## 2022-01-14 DIAGNOSIS — I472 Ventricular tachycardia, unspecified: Secondary | ICD-10-CM | POA: Diagnosis not present

## 2022-01-14 DIAGNOSIS — I4819 Other persistent atrial fibrillation: Secondary | ICD-10-CM | POA: Diagnosis present

## 2022-01-14 DIAGNOSIS — Z91040 Latex allergy status: Secondary | ICD-10-CM

## 2022-01-14 DIAGNOSIS — R6520 Severe sepsis without septic shock: Secondary | ICD-10-CM

## 2022-01-14 DIAGNOSIS — Z8249 Family history of ischemic heart disease and other diseases of the circulatory system: Secondary | ICD-10-CM

## 2022-01-14 DIAGNOSIS — I517 Cardiomegaly: Secondary | ICD-10-CM | POA: Diagnosis not present

## 2022-01-14 DIAGNOSIS — M659 Synovitis and tenosynovitis, unspecified: Secondary | ICD-10-CM | POA: Diagnosis present

## 2022-01-14 DIAGNOSIS — M009 Pyogenic arthritis, unspecified: Secondary | ICD-10-CM | POA: Diagnosis present

## 2022-01-14 DIAGNOSIS — K551 Chronic vascular disorders of intestine: Secondary | ICD-10-CM | POA: Diagnosis present

## 2022-01-14 DIAGNOSIS — M00061 Staphylococcal arthritis, right knee: Secondary | ICD-10-CM

## 2022-01-14 LAB — I-STAT CHEM 8, ED
BUN: 27 mg/dL — ABNORMAL HIGH (ref 8–23)
Calcium, Ion: 1.12 mmol/L — ABNORMAL LOW (ref 1.15–1.40)
Chloride: 102 mmol/L (ref 98–111)
Creatinine, Ser: 2.4 mg/dL — ABNORMAL HIGH (ref 0.61–1.24)
Glucose, Bld: 117 mg/dL — ABNORMAL HIGH (ref 70–99)
HCT: 34 % — ABNORMAL LOW (ref 39.0–52.0)
Hemoglobin: 11.6 g/dL — ABNORMAL LOW (ref 13.0–17.0)
Potassium: 3.6 mmol/L (ref 3.5–5.1)
Sodium: 136 mmol/L (ref 135–145)
TCO2: 20 mmol/L — ABNORMAL LOW (ref 22–32)

## 2022-01-14 LAB — CBG MONITORING, ED: Glucose-Capillary: 152 mg/dL — ABNORMAL HIGH (ref 70–99)

## 2022-01-14 LAB — COMPREHENSIVE METABOLIC PANEL
ALT: 13 U/L (ref 0–44)
AST: 14 U/L — ABNORMAL LOW (ref 15–41)
Albumin: 2.6 g/dL — ABNORMAL LOW (ref 3.5–5.0)
Alkaline Phosphatase: 56 U/L (ref 38–126)
Anion gap: 10 (ref 5–15)
BUN: 27 mg/dL — ABNORMAL HIGH (ref 8–23)
CO2: 21 mmol/L — ABNORMAL LOW (ref 22–32)
Calcium: 8.7 mg/dL — ABNORMAL LOW (ref 8.9–10.3)
Chloride: 103 mmol/L (ref 98–111)
Creatinine, Ser: 2.4 mg/dL — ABNORMAL HIGH (ref 0.61–1.24)
GFR, Estimated: 30 mL/min — ABNORMAL LOW (ref >60)
Glucose, Bld: 119 mg/dL — ABNORMAL HIGH (ref 70–99)
Potassium: 3.6 mmol/L (ref 3.5–5.1)
Sodium: 134 mmol/L — ABNORMAL LOW (ref 135–145)
Total Bilirubin: 1 mg/dL (ref 0.3–1.2)
Total Protein: 7 g/dL (ref 6.5–8.1)

## 2022-01-14 LAB — CBC WITH DIFFERENTIAL/PLATELET
Abs Immature Granulocytes: 0.19 10*3/uL — ABNORMAL HIGH (ref 0.00–0.07)
Basophils Absolute: 0 10*3/uL (ref 0.0–0.1)
Basophils Relative: 0 %
Eosinophils Absolute: 0 10*3/uL (ref 0.0–0.5)
Eosinophils Relative: 0 %
HCT: 29.2 % — ABNORMAL LOW (ref 39.0–52.0)
Hemoglobin: 9.8 g/dL — ABNORMAL LOW (ref 13.0–17.0)
Immature Granulocytes: 1 %
Lymphocytes Relative: 9 %
Lymphs Abs: 1.2 10*3/uL (ref 0.7–4.0)
MCH: 24.3 pg — ABNORMAL LOW (ref 26.0–34.0)
MCHC: 33.6 g/dL (ref 30.0–36.0)
MCV: 72.3 fL — ABNORMAL LOW (ref 80.0–100.0)
Monocytes Absolute: 2.1 10*3/uL — ABNORMAL HIGH (ref 0.1–1.0)
Monocytes Relative: 14 %
Neutro Abs: 10.7 10*3/uL — ABNORMAL HIGH (ref 1.7–7.7)
Neutrophils Relative %: 76 %
Platelets: 225 10*3/uL (ref 150–400)
RBC: 4.04 MIL/uL — ABNORMAL LOW (ref 4.22–5.81)
RDW: 16.4 % — ABNORMAL HIGH (ref 11.5–15.5)
WBC: 14.2 10*3/uL — ABNORMAL HIGH (ref 4.0–10.5)
nRBC: 0 % (ref 0.0–0.2)

## 2022-01-14 LAB — I-STAT VENOUS BLOOD GAS, ED
Acid-base deficit: 1 mmol/L (ref 0.0–2.0)
Bicarbonate: 23 mmol/L (ref 20.0–28.0)
Calcium, Ion: 1.11 mmol/L — ABNORMAL LOW (ref 1.15–1.40)
HCT: 32 % — ABNORMAL LOW (ref 39.0–52.0)
Hemoglobin: 10.9 g/dL — ABNORMAL LOW (ref 13.0–17.0)
O2 Saturation: 100 %
Potassium: 3.8 mmol/L (ref 3.5–5.1)
Sodium: 135 mmol/L (ref 135–145)
TCO2: 24 mmol/L (ref 22–32)
pCO2, Ven: 34.7 mmHg — ABNORMAL LOW (ref 44–60)
pH, Ven: 7.428 (ref 7.25–7.43)
pO2, Ven: 200 mmHg — ABNORMAL HIGH (ref 32–45)

## 2022-01-14 LAB — APTT: aPTT: 45 seconds — ABNORMAL HIGH (ref 24–36)

## 2022-01-14 LAB — LACTIC ACID, PLASMA: Lactic Acid, Venous: 1.5 mmol/L (ref 0.5–1.9)

## 2022-01-14 LAB — PROTIME-INR
INR: 2.2 — ABNORMAL HIGH (ref 0.8–1.2)
Prothrombin Time: 24.1 seconds — ABNORMAL HIGH (ref 11.4–15.2)

## 2022-01-14 LAB — FIBRINOGEN: Fibrinogen: 549 mg/dL — ABNORMAL HIGH (ref 210–475)

## 2022-01-14 LAB — AMMONIA: Ammonia: 31 umol/L (ref 9–35)

## 2022-01-14 MED ORDER — LACTATED RINGERS IV BOLUS
1000.0000 mL | Freq: Once | INTRAVENOUS | Status: AC
  Administered 2022-01-14: 1000 mL via INTRAVENOUS

## 2022-01-14 MED ORDER — NALOXONE HCL 2 MG/2ML IJ SOSY
1.0000 mg | PREFILLED_SYRINGE | Freq: Once | INTRAMUSCULAR | Status: AC
  Administered 2022-01-14: 1 mg via INTRAVENOUS
  Filled 2022-01-14: qty 2

## 2022-01-14 MED ORDER — LINEZOLID 600 MG/300ML IV SOLN
600.0000 mg | Freq: Once | INTRAVENOUS | Status: AC
  Administered 2022-01-15: 600 mg via INTRAVENOUS
  Filled 2022-01-14: qty 300

## 2022-01-14 MED ORDER — IOHEXOL 350 MG/ML SOLN
80.0000 mL | Freq: Once | INTRAVENOUS | Status: AC | PRN
  Administered 2022-01-14: 80 mL via INTRAVENOUS

## 2022-01-14 MED ORDER — NOREPINEPHRINE 4 MG/250ML-% IV SOLN: 0.0000 ug/min | INTRAVENOUS | Status: DC

## 2022-01-14 MED ORDER — NALOXONE HCL 0.4 MG/ML IJ SOLN
0.4000 mg | Freq: Once | INTRAMUSCULAR | Status: AC
  Administered 2022-01-14: 0.4 mg via INTRAVENOUS
  Filled 2022-01-14: qty 1

## 2022-01-14 MED ORDER — SODIUM CHLORIDE 0.9 % IV SOLN
2.0000 g | Freq: Once | INTRAVENOUS | Status: AC
  Administered 2022-01-14: 2 g via INTRAVENOUS
  Filled 2022-01-14: qty 12.5

## 2022-01-14 MED ORDER — SODIUM CHLORIDE 0.9 % IV BOLUS
1000.0000 mL | Freq: Once | INTRAVENOUS | Status: AC
  Administered 2022-01-14: 1000 mL via INTRAVENOUS

## 2022-01-14 NOTE — ED Provider Triage Note (Signed)
Emergency Medicine Provider Triage Evaluation Note  [name] , a 61 y.o. male  was evaluated in triage.  Pt complains of presents as sepsis, he is afebrile tachycardic and hypotensive during my examination he was somnolent and unable provide much history.  Wife at bedside states that he was diagnosed with bacteremia and was discharged a few days ago states that his right leg is got larger become more painful..  Review of Systems  Positive: Leg pain Negative: Chest pain, shortness of breath  Physical Exam  BP (!) 64/44   Pulse 87   Temp 100 F (37.8 C) (Oral)   Resp 20   SpO2 95%  Gen:   Awake, no distress   Resp:  Normal effort  MSK:   Moves extremities without difficulty  Other:  Somnolent but arousable, no facial asymmetry no unilateral weakness present.  Medical Decision Making  Medically screening exam initiated at 9:30 PM.  Appropriate orders placed.  [name] was informed that the remainder of the evaluation will be completed by another provider, this initial triage assessment does not replace that evaluation, and the importance of remaining in the ED until their evaluation is complete.  Hypotensive, tachycardic, meets sepsis criteria, talk to in charge who will  room the patient.   Marcello Fennel, PA-C 01/14/22 2132

## 2022-01-14 NOTE — Telephone Encounter (Signed)
Third attempt to reach pt. "Voice mail not set up" on number provided by pt.  Also tried to reach him on additional numbers provided in demographics. "Call cannot be completed at this time." Routing to practice for PCPs resolution after 3 attempts per protocol.

## 2022-01-14 NOTE — Telephone Encounter (Signed)
Pt is in severe pain from swollen knee/ pt just was seen in the hospital for infection in his blood / please advise / they asked to speak with DR. Wqright for a f/u right away   Attempted to reach pt, recording "Voice mail is not set up."  Unable to leave message to CB.

## 2022-01-14 NOTE — Progress Notes (Incomplete)
30 gfr-Dr Oswaldo Milian put in progress note for benefits outweight risk and patient has had several bags of fluids

## 2022-01-14 NOTE — ED Provider Notes (Signed)
[place] EMERGENCY DEPARTMENT Provider Note   CSN: [id number] Arrival date & time: 01/14/22  2011     History {Add pertinent medical, surgical, social history, OB history to HPI:1} Chief Complaint  Patient presents with   Hypotension    [name] is a 61 y.o. male.  HPI     Home Medications Prior to Admission medications   Medication Sig Start Date End Date Taking? Authorizing Provider  acetaminophen (TYLENOL) 500 MG tablet Take 500-1,000 mg by mouth every 6 (six) hours as needed for moderate pain.    [provider]  allopurinol (ZYLOPRIM) 100 MG tablet TAKE 1 TABLET (100 MG TOTAL) BY MOUTH DAILY. Patient taking differently: Take 100 mg by mouth daily. 09/30/21 09/30/22  [name], MD  amoxicillin (AMOXIL) 500 MG capsule Take 2 capsules (1,000 mg total) by mouth every 8 (eight) hours for 10 days. 01/06/22 01/16/22  Danford, Suann Larry, MD  apixaban (ELIQUIS) 5 MG TABS tablet Take 1 tablet (5 mg total) by mouth 2 (two) times daily. 11/08/21   [name], MD  aspirin EC 81 MG tablet Take 81 mg by mouth daily. Swallow whole.    [provider]  atorvastatin (LIPITOR) 20 MG tablet Take 20 mg by mouth daily. 12/27/21   [provider]  carvedilol (COREG) 25 MG tablet Take 1 tablet (25 mg total) by mouth 2 (two) times daily with a meal. 08/23/21 01/13/22  [name], MD  cloNIDine (CATAPRES) 0.2 MG tablet Take 1 tablet (0.2 mg total) by mouth 3 (three) times daily. 11/08/21   [name], MD  FARXIGA 10 MG TABS tablet Take 10 mg by mouth daily. 01/08/22   [provider]  ferrous sulfate 325 (65 FE) MG EC tablet Take 1 tablet (325 mg total) by mouth every other day. 01/06/22 01/06/23  Danford, Suann Larry, MD  furosemide (LASIX) 40 MG tablet Take 1.5 tablets (60 mg total) by mouth 3 (three) times daily. 01/13/22   Garald Balding, PA-C  gabapentin (NEURONTIN) 400 MG capsule TAKE 2 CAPSULES(800 MG) BY MOUTH  THREE TIMES DAILY Patient taking differently: Take 800 mg by mouth 3 (three) times daily. 11/08/21   [name], MD  hydrALAZINE (APRESOLINE) 50 MG tablet Take 50 mg by mouth 3 (three) times daily. 11/24/21   [provider]  magnesium oxide (MAG-OX) 400 MG tablet Take 1 tablet (400 mg total) by mouth every other day. 11/09/21   Donato Heinz, MD  olmesartan (BENICAR) 40 MG tablet Take 40 mg by mouth daily.    [provider]  omeprazole (PRILOSEC) 40 MG capsule Take 1 capsule (40 mg total) by mouth daily. 12/07/21   Donato Heinz, MD  oxyCODONE (OXY IR/ROXICODONE) 5 MG immediate release tablet Take 1 tablet (5 mg total) by mouth every 8 (eight) hours as needed for severe pain. 01/06/22   Danford, Suann Larry, MD  potassium chloride SA (KLOR-CON M) 20 MEQ tablet Take 20 mEq by mouth daily.    [provider]      Allergies    Adhesive [tape] and Latex    Review of Systems   Review of Systems  Physical Exam Updated Vital Signs BP (!) 74/58   Pulse 73   Temp 100 F (37.8 C) (Oral)   Resp 20   SpO2 96%  Physical Exam  ED Results / Procedures / Treatments   Labs (all labs ordered are listed, but only abnormal results are displayed)  Labs Reviewed  CBG MONITORING, ED - Abnormal; Notable for the following components:      Result Value   Glucose-Capillary 152 (*)    All other components within normal limits  CULTURE, BLOOD (ROUTINE X 2)  CULTURE, BLOOD (ROUTINE X 2)  URINE CULTURE  LACTIC ACID, PLASMA  LACTIC ACID, PLASMA  COMPREHENSIVE METABOLIC PANEL  CBC WITH DIFFERENTIAL/PLATELET  PROTIME-INR  APTT  URINALYSIS, ROUTINE W REFLEX MICROSCOPIC    EKG None  Radiology VAS [place] LOWER EXTREMITY VENOUS (DVT)  Result Date: 01/13/2022  Lower Venous DVT Study Patient Name:  [name]  Date of Exam:   01/13/2022 Medical Rec #: [id number]   Accession #:    [id number] Date of Birth: [birth year]   Patient Gender: M Patient Age:   34 years  Exam Location:  [facility] Procedure:      VAS [place] LOWER EXTREMITY VENOUS (DVT) Referring Phys: Verdis Frederickson BELAYA --------------------------------------------------------------------------------  Indications: Swelling, skin changes, pain to right leg with blistering of distal calf.  Comparison Study: Extensive arterial history with monophasic runoff and                   calcification bilaterally on most recent examination                   11-25-2021.                    06-18-2021 Prior right lower extremity venous was negative for                   DVT. Performing Technologist: Darlin Coco RDMS, RVT  Examination Guidelines: A complete evaluation includes B-mode imaging, spectral Doppler, color Doppler, and power Doppler as needed of all accessible portions of each vessel. Bilateral testing is considered an integral part of a complete examination. Limited examinations for reoccurring indications may be performed as noted. The reflux portion of the exam is performed with the patient in reverse Trendelenburg.  +---------+---------------+---------+-----------+----------+--------------+ RIGHT    CompressibilityPhasicitySpontaneityPropertiesThrombus Aging +---------+---------------+---------+-----------+----------+--------------+ CFV      Full           Yes      Yes                                 +---------+---------------+---------+-----------+----------+--------------+ SFJ      Full                                                        +---------+---------------+---------+-----------+----------+--------------+ FV Prox  Full                                                        +---------+---------------+---------+-----------+----------+--------------+ FV Mid   Full                                                        +---------+---------------+---------+-----------+----------+--------------+ FV DistalFull                                                         +---------+---------------+---------+-----------+----------+--------------+  PFV      Full                                                        +---------+---------------+---------+-----------+----------+--------------+ POP      Full           Yes      Yes                                 +---------+---------------+---------+-----------+----------+--------------+ PTV      Full                                                        +---------+---------------+---------+-----------+----------+--------------+ PERO     Full                                                        +---------+---------------+---------+-----------+----------+--------------+ Gastroc  Full                                                        +---------+---------------+---------+-----------+----------+--------------+   +----+---------------+---------+-----------+----------+--------------+ LEFTCompressibilityPhasicitySpontaneityPropertiesThrombus Aging +----+---------------+---------+-----------+----------+--------------+ CFV Full           Yes      Yes                                 +----+---------------+---------+-----------+----------+--------------+     Summary: RIGHT: - There is no evidence of deep vein thrombosis in the lower extremity. Appearance of fluid collection in the joint space on the medial aspect of the knee.  LEFT: - No evidence of common femoral vein obstruction.  *See table(s) above for measurements and observations. Electronically signed by Orlie Pollen on 01/13/2022 at 8:08:11 PM.    Final     Procedures Procedures  {Document cardiac monitor, telemetry assessment procedure when appropriate:1}  Medications Ordered in ED Medications  ceFEPIme (MAXIPIME) 2 g in sodium chloride 0.9 % 100 mL IVPB (has no administration in time range)  linezolid (ZYVOX) IVPB 600 mg (has no administration in time range)  sodium chloride 0.9 % bolus 1,000 mL (1,000 mLs Intravenous New  Bag/Given 01/14/22 2141)  sodium chloride 0.9 % bolus 1,000 mL (1,000 mLs Intravenous New Bag/Given 01/14/22 2142)    ED Course/ Medical Decision Making/ A&P                           Medical Decision Making Risk Prescription drug management.   ***  {Document critical care time when appropriate:1} {Document review of labs and clinical decision tools ie heart score, Chads2Vasc2 etc:1}  {Document your independent review of radiology images, and any outside records:1} {Document your discussion with family members, caretakers, and with consultants:1} {Document social  determinants of health affecting pt's care:1} {Document your decision making why or why not admission, treatments were needed:1} Final Clinical Impression(s) / ED Diagnoses Final diagnoses:  None    Rx / DC Orders ED Discharge Orders     None

## 2022-01-14 NOTE — Telephone Encounter (Signed)
Second attempt to return his call.    Voicemail has not been set up yet.   Unable to leave message.

## 2022-01-14 NOTE — ED Provider Notes (Signed)
I saw and evaluated the patient, reviewed the resident's note and I agree with the findings and plan.       Arby Barrette, MD 01/16/22 1054

## 2022-01-14 NOTE — ED Notes (Signed)
Patient transported to CT with RN. Pt continues to be hypotensive with systolic 11/91/47 over 82/95A consistently with no marked improvement post 2000 NS bolus completion, however maps remain >65 so pressers held at this time. Pt remains only alert to painful stimuli and only momentarily will open eyes before becoming somnolent again (pt was able to provide name with significant, repeated stimuli). No change/imporvement in pt mentation post narcan administration (0.4mg  then 1mg ). Pt oxygenation on RA sufficient (98%), but remains on 2L [place] for support at this time.   Pt right knee is significantly swollen with skin tear/sloughing to medial aspect of distal LLE.

## 2022-01-15 ENCOUNTER — Emergency Department (HOSPITAL_COMMUNITY): Payer: Medicare Other

## 2022-01-15 DIAGNOSIS — R6520 Severe sepsis without septic shock: Secondary | ICD-10-CM

## 2022-01-15 DIAGNOSIS — Z79899 Other long term (current) drug therapy: Secondary | ICD-10-CM | POA: Diagnosis not present

## 2022-01-15 DIAGNOSIS — I7772 Dissection of iliac artery: Secondary | ICD-10-CM | POA: Diagnosis not present

## 2022-01-15 DIAGNOSIS — I723 Aneurysm of iliac artery: Secondary | ICD-10-CM | POA: Diagnosis present

## 2022-01-15 DIAGNOSIS — M25461 Effusion, right knee: Secondary | ICD-10-CM | POA: Diagnosis not present

## 2022-01-15 DIAGNOSIS — L03115 Cellulitis of right lower limb: Secondary | ICD-10-CM | POA: Diagnosis present

## 2022-01-15 DIAGNOSIS — I130 Hypertensive heart and chronic kidney disease with heart failure and stage 1 through stage 4 chronic kidney disease, or unspecified chronic kidney disease: Secondary | ICD-10-CM | POA: Diagnosis not present

## 2022-01-15 DIAGNOSIS — R0602 Shortness of breath: Secondary | ICD-10-CM | POA: Diagnosis not present

## 2022-01-15 DIAGNOSIS — A400 Sepsis due to streptococcus, group A: Secondary | ICD-10-CM | POA: Diagnosis not present

## 2022-01-15 DIAGNOSIS — A419 Sepsis, unspecified organism: Secondary | ICD-10-CM | POA: Diagnosis not present

## 2022-01-15 DIAGNOSIS — X58XXXA Exposure to other specified factors, initial encounter: Secondary | ICD-10-CM | POA: Diagnosis present

## 2022-01-15 DIAGNOSIS — S76111A Strain of right quadriceps muscle, fascia and tendon, initial encounter: Secondary | ICD-10-CM | POA: Diagnosis not present

## 2022-01-15 DIAGNOSIS — M7731 Calcaneal spur, right foot: Secondary | ICD-10-CM | POA: Diagnosis not present

## 2022-01-15 DIAGNOSIS — I5033 Acute on chronic diastolic (congestive) heart failure: Secondary | ICD-10-CM | POA: Diagnosis not present

## 2022-01-15 DIAGNOSIS — I2510 Atherosclerotic heart disease of native coronary artery without angina pectoris: Secondary | ICD-10-CM | POA: Diagnosis not present

## 2022-01-15 DIAGNOSIS — R6521 Severe sepsis with septic shock: Secondary | ICD-10-CM

## 2022-01-15 DIAGNOSIS — I1A0 Resistant hypertension: Secondary | ICD-10-CM | POA: Diagnosis not present

## 2022-01-15 DIAGNOSIS — S80921A Unspecified superficial injury of right lower leg, initial encounter: Secondary | ICD-10-CM | POA: Diagnosis not present

## 2022-01-15 DIAGNOSIS — N183 Chronic kidney disease, stage 3 unspecified: Secondary | ICD-10-CM | POA: Diagnosis not present

## 2022-01-15 DIAGNOSIS — E1122 Type 2 diabetes mellitus with diabetic chronic kidney disease: Secondary | ICD-10-CM | POA: Diagnosis not present

## 2022-01-15 DIAGNOSIS — I77810 Thoracic aortic ectasia: Secondary | ICD-10-CM | POA: Diagnosis present

## 2022-01-15 DIAGNOSIS — N179 Acute kidney failure, unspecified: Secondary | ICD-10-CM

## 2022-01-15 DIAGNOSIS — M00861 Arthritis due to other bacteria, right knee: Secondary | ICD-10-CM | POA: Diagnosis not present

## 2022-01-15 DIAGNOSIS — K551 Chronic vascular disorders of intestine: Secondary | ICD-10-CM | POA: Diagnosis not present

## 2022-01-15 DIAGNOSIS — I350 Nonrheumatic aortic (valve) stenosis: Secondary | ICD-10-CM | POA: Diagnosis not present

## 2022-01-15 DIAGNOSIS — M1990 Unspecified osteoarthritis, unspecified site: Secondary | ICD-10-CM | POA: Diagnosis present

## 2022-01-15 DIAGNOSIS — M1711 Unilateral primary osteoarthritis, right knee: Secondary | ICD-10-CM | POA: Diagnosis not present

## 2022-01-15 DIAGNOSIS — I739 Peripheral vascular disease, unspecified: Secondary | ICD-10-CM | POA: Diagnosis present

## 2022-01-15 DIAGNOSIS — Z452 Encounter for adjustment and management of vascular access device: Secondary | ICD-10-CM | POA: Diagnosis not present

## 2022-01-15 DIAGNOSIS — L03116 Cellulitis of left lower limb: Secondary | ICD-10-CM | POA: Diagnosis present

## 2022-01-15 DIAGNOSIS — M009 Pyogenic arthritis, unspecified: Secondary | ICD-10-CM | POA: Diagnosis not present

## 2022-01-15 DIAGNOSIS — M1A9XX1 Chronic gout, unspecified, with tophus (tophi): Secondary | ICD-10-CM | POA: Diagnosis present

## 2022-01-15 DIAGNOSIS — I509 Heart failure, unspecified: Secondary | ICD-10-CM | POA: Diagnosis not present

## 2022-01-15 DIAGNOSIS — I517 Cardiomegaly: Secondary | ICD-10-CM | POA: Diagnosis not present

## 2022-01-15 DIAGNOSIS — D689 Coagulation defect, unspecified: Secondary | ICD-10-CM | POA: Diagnosis not present

## 2022-01-15 DIAGNOSIS — I080 Rheumatic disorders of both mitral and aortic valves: Secondary | ICD-10-CM | POA: Diagnosis not present

## 2022-01-15 DIAGNOSIS — N1832 Chronic kidney disease, stage 3b: Secondary | ICD-10-CM | POA: Diagnosis not present

## 2022-01-15 DIAGNOSIS — M7989 Other specified soft tissue disorders: Secondary | ICD-10-CM | POA: Diagnosis not present

## 2022-01-15 DIAGNOSIS — I4819 Other persistent atrial fibrillation: Secondary | ICD-10-CM | POA: Diagnosis not present

## 2022-01-15 DIAGNOSIS — M2548 Effusion, other site: Secondary | ICD-10-CM | POA: Diagnosis not present

## 2022-01-15 DIAGNOSIS — I5032 Chronic diastolic (congestive) heart failure: Secondary | ICD-10-CM | POA: Diagnosis not present

## 2022-01-15 DIAGNOSIS — E7800 Pure hypercholesterolemia, unspecified: Secondary | ICD-10-CM | POA: Diagnosis present

## 2022-01-15 DIAGNOSIS — N189 Chronic kidney disease, unspecified: Secondary | ICD-10-CM | POA: Diagnosis not present

## 2022-01-15 DIAGNOSIS — D631 Anemia in chronic kidney disease: Secondary | ICD-10-CM | POA: Diagnosis not present

## 2022-01-15 DIAGNOSIS — I472 Ventricular tachycardia, unspecified: Secondary | ICD-10-CM | POA: Diagnosis not present

## 2022-01-15 DIAGNOSIS — M79604 Pain in right leg: Secondary | ICD-10-CM | POA: Diagnosis not present

## 2022-01-15 LAB — CBC
HCT: 30.1 % — ABNORMAL LOW (ref 39.0–52.0)
Hemoglobin: 9.9 g/dL — ABNORMAL LOW (ref 13.0–17.0)
MCH: 23.7 pg — ABNORMAL LOW (ref 26.0–34.0)
MCHC: 32.9 g/dL (ref 30.0–36.0)
MCV: 72.2 fL — ABNORMAL LOW (ref 80.0–100.0)
Platelets: 222 10*3/uL (ref 150–400)
RBC: 4.17 MIL/uL — ABNORMAL LOW (ref 4.22–5.81)
RDW: 16.4 % — ABNORMAL HIGH (ref 11.5–15.5)
WBC: 11.8 10*3/uL — ABNORMAL HIGH (ref 4.0–10.5)
nRBC: 0 % (ref 0.0–0.2)

## 2022-01-15 LAB — TROPONIN I (HIGH SENSITIVITY)
Troponin I (High Sensitivity): 29 ng/L — ABNORMAL HIGH (ref <18)
Troponin I (High Sensitivity): 29 ng/L — ABNORMAL HIGH (ref <18)

## 2022-01-15 LAB — PROCALCITONIN: Procalcitonin: 3.95 ng/mL

## 2022-01-15 LAB — COMPREHENSIVE METABOLIC PANEL
ALT: 13 U/L (ref 0–44)
AST: 13 U/L — ABNORMAL LOW (ref 15–41)
Albumin: 2.5 g/dL — ABNORMAL LOW (ref 3.5–5.0)
Alkaline Phosphatase: 53 U/L (ref 38–126)
Anion gap: 9 (ref 5–15)
BUN: 26 mg/dL — ABNORMAL HIGH (ref 8–23)
CO2: 19 mmol/L — ABNORMAL LOW (ref 22–32)
Calcium: 8.5 mg/dL — ABNORMAL LOW (ref 8.9–10.3)
Chloride: 105 mmol/L (ref 98–111)
Creatinine, Ser: 2 mg/dL — ABNORMAL HIGH (ref 0.61–1.24)
GFR, Estimated: 37 mL/min — ABNORMAL LOW (ref >60)
Glucose, Bld: 113 mg/dL — ABNORMAL HIGH (ref 70–99)
Potassium: 4 mmol/L (ref 3.5–5.1)
Sodium: 133 mmol/L — ABNORMAL LOW (ref 135–145)
Total Bilirubin: 0.8 mg/dL (ref 0.3–1.2)
Total Protein: 6.6 g/dL (ref 6.5–8.1)

## 2022-01-15 LAB — SEDIMENTATION RATE: Sed Rate: 46 mm/hr — ABNORMAL HIGH (ref 0–16)

## 2022-01-15 LAB — HIV ANTIBODY (ROUTINE TESTING W REFLEX): HIV Screen 4th Generation wRfx: NONREACTIVE

## 2022-01-15 LAB — MAGNESIUM: Magnesium: 1.7 mg/dL (ref 1.7–2.4)

## 2022-01-15 LAB — C-REACTIVE PROTEIN: CRP: 16.8 mg/dL — ABNORMAL HIGH (ref <1.0)

## 2022-01-15 LAB — BRAIN NATRIURETIC PEPTIDE: B Natriuretic Peptide: 338.8 pg/mL — ABNORMAL HIGH (ref 0.0–100.0)

## 2022-01-15 MED ORDER — ALBUTEROL SULFATE (2.5 MG/3ML) 0.083% IN NEBU: 2.5000 mg | INHALATION_SOLUTION | RESPIRATORY_TRACT | Status: DC | PRN

## 2022-01-15 MED ORDER — APIXABAN 5 MG PO TABS
5.0000 mg | ORAL_TABLET | Freq: Two times a day (BID) | ORAL | Status: DC
  Administered 2022-01-15 – 2022-01-16 (×4): 5 mg via ORAL
  Filled 2022-01-15 (×4): qty 1

## 2022-01-15 MED ORDER — ASPIRIN 81 MG PO TBEC
81.0000 mg | DELAYED_RELEASE_TABLET | Freq: Every day | ORAL | Status: DC
  Administered 2022-01-15 – 2022-01-19 (×5): 81 mg via ORAL
  Filled 2022-01-15 (×5): qty 1

## 2022-01-15 MED ORDER — VANCOMYCIN HCL 1500 MG/300ML IV SOLN
1500.0000 mg | Freq: Once | INTRAVENOUS | Status: AC
  Administered 2022-01-15: 1500 mg via INTRAVENOUS
  Filled 2022-01-15: qty 300

## 2022-01-15 MED ORDER — ATORVASTATIN CALCIUM 10 MG PO TABS
20.0000 mg | ORAL_TABLET | Freq: Every day | ORAL | Status: DC
  Administered 2022-01-15 – 2022-01-25 (×11): 20 mg via ORAL
  Filled 2022-01-15 (×11): qty 2

## 2022-01-15 MED ORDER — LACTATED RINGERS IV SOLN: INTRAVENOUS | Status: AC

## 2022-01-15 MED ORDER — ACETAMINOPHEN 325 MG PO TABS
650.0000 mg | ORAL_TABLET | Freq: Four times a day (QID) | ORAL | Status: DC | PRN
  Administered 2022-01-15: 650 mg via ORAL
  Filled 2022-01-15: qty 2

## 2022-01-15 MED ORDER — ONDANSETRON HCL 4 MG/2ML IJ SOLN
4.0000 mg | Freq: Four times a day (QID) | INTRAMUSCULAR | Status: DC | PRN
  Administered 2022-01-21 – 2022-01-24 (×2): 4 mg via INTRAVENOUS
  Filled 2022-01-15 (×2): qty 2

## 2022-01-15 MED ORDER — ACETAMINOPHEN 650 MG RE SUPP: 650.0000 mg | Freq: Four times a day (QID) | RECTAL | Status: DC | PRN

## 2022-01-15 MED ORDER — ONDANSETRON HCL 4 MG PO TABS
4.0000 mg | ORAL_TABLET | Freq: Four times a day (QID) | ORAL | Status: DC | PRN
  Administered 2022-01-15: 4 mg via ORAL
  Filled 2022-01-15 (×2): qty 1

## 2022-01-15 MED ORDER — FERROUS SULFATE 325 (65 FE) MG PO TABS
325.0000 mg | ORAL_TABLET | ORAL | Status: DC
  Administered 2022-01-15 – 2022-01-25 (×6): 325 mg via ORAL
  Filled 2022-01-15 (×8): qty 1

## 2022-01-15 MED ORDER — DILTIAZEM HCL-DEXTROSE 125-5 MG/125ML-% IV SOLN (PREMIX)
5.0000 mg/h | INTRAVENOUS | Status: DC
  Administered 2022-01-15: 5 mg/h via INTRAVENOUS
  Filled 2022-01-15: qty 125

## 2022-01-15 MED ORDER — VANCOMYCIN VARIABLE DOSE PER UNSTABLE RENAL FUNCTION (PHARMACIST DOSING): Status: DC

## 2022-01-15 MED ORDER — HYDROCODONE-ACETAMINOPHEN 5-325 MG PO TABS
1.0000 | ORAL_TABLET | ORAL | Status: DC | PRN
  Administered 2022-01-15 – 2022-01-17 (×4): 2 via ORAL
  Filled 2022-01-15 (×4): qty 2

## 2022-01-15 MED ORDER — SODIUM CHLORIDE 0.9 % IV SOLN
2.0000 g | Freq: Two times a day (BID) | INTRAVENOUS | Status: DC
  Administered 2022-01-15 – 2022-01-19 (×9): 2 g via INTRAVENOUS
  Filled 2022-01-15 (×9): qty 12.5

## 2022-01-15 NOTE — Progress Notes (Signed)
PROGRESS NOTE    [name]  [id number] DOB: 1961/01/31 DOA: 01/14/2022 PCP: [name], MD  Outpatient Specialists:     Brief Narrative:  As per H&P done on admission: "[name] is a 60 y.o. male with medical history significant of  dCHF, AF on Eliquis, CKD IIIb baseline 1.3-1.4, chronic pain, HTN, HLD, and OSA not on CPAP, who has recent interim history of hospitalization 01/01/22-01/06/22 with diagnosis of Sepsis due to group A Streptococcus with acute renal failure in setting of lower extremity cellulitis as well as swollen knee that at that time was ruled out of septic arthritis per ortho and was at that time treated with steroid injections in setting of fluid analysis showing WBC of 46.Marland Kitchen Patient returns to ed for second time in 48 hours with complaint of progressive painful right as well as progressive generalized weakness and change in mental status.  Patient is currently , lethargic and not able to give history at this time or complete ros.   ED Course:  ON evaluation , patient noted to meet criteria for sepsis   Temp 100.2,  bp 120 /70 with nadir [id number] systolic,  sat 0000000  Wbc 14 with left shift, inr 2.2,lacti 1.5, cr 2.40 up from 1.5, Mag 1.7 patient s/p 3 L still with lower bp , however prior to starting pressors bp  stablized.   Cxr noted mild vascular congestion DG Knee : 1. No acute fracture or dislocation. 2. 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  Patient will monitored on progressive care with low threshold for transfer to ICU if patient has further decline".  01/15/2022: Patient seen.  Patient is unable to give detailed history.  Patient is quite sleepy.  Orthopedic team consulted.  Plan is for aspiration of the right knee joint effusion for further analysis.  Will  monitor, will continue IV antibiotics for now.    Assessment & Plan:   Principal Problem:   Sepsis ([place])   Severe sepsis with septic shock: -Possible right knee septic arthritis. -Follow cultures. -Continue vancomycin and cefepime. -Orthopedic input is appreciated.  For arthrocentesis and further analysis of the joint fluid. -Critical care input is appreciated as well. -Guarded prognosis.     AKI on CKDIIIb: -hold nephrotoxic medications  -gently ivfs  -monitor I/o closely  -AKI is likely prerenal/ATN. -Continue to monitor renal function and electrolytes.   dCHF -no overt exacerbation  -although noted mild congestion on CXR  -will monitor closely  -gentle ivfs -if patient has recurrent hypotension will need vasopressor support       AF  -hold eliquis - start heparin drip one inr within normal range      Coagulopathy  -per critical care related to Eliquis not sepsis - hold eliquis -monitor inr daily    HTN -hold bp medications at this time    HLD -resume statin one patient tolerating full diet  OSA  -note on cpap  -nocturnal O2   Chronic pain -supportive care    DVT prophylaxis: Consider starting anticoagulation after right knee joint aspiration Code Status: Full code Family Communication:  Disposition Plan: This will depend on hospital course   Consultants:  Critical care for septic shock Orthopedic team for possible septic arthritis of the right knee  Procedures:  Orthopedic team plans right knee  aspiration  Antimicrobials:  IV vancomycin IV cefepime   Subjective: Sleepy. Noted detailed historian.  Objective: Vitals:   01/15/22 1100 01/15/22 1132 01/15/22 1135 01/15/22 1230  BP: (!) 134/107  (!) 137/100 (!) 129/94  Pulse: 63  64 77  Resp: (!) 23  18 17   Temp:  97.8 F (36.6 C)    TempSrc:  Oral    SpO2: 98%  99% 98%  Weight:      Height:       No intake or output data in the 24 hours ending 01/15/22 1344 Filed Weights    01/15/22 0130  Weight: 94.3 kg    Examination:  General exam: Quite sleepy.   Respiratory system: Clear to auscultation.  Cardiovascular system: S1 & S2 heard Gastrointestinal system: Abdomen is soft and nontender.  Central nervous system: Sleepy.  Moves all extremities.   Extremities: Swollen and warm right knee joint.  Chronic changes bilateral lower legs.  Superficial also right lower leg.    Data Reviewed: I have personally reviewed following labs and imaging studies  CBC: Recent Labs  Lab 01/13/22 0424 01/14/22 2128 01/14/22 2219 01/14/22 2245 01/15/22 0450  WBC 5.5 14.2*  --   --  11.8*  NEUTROABS 3.1 10.7*  --   --   --   HGB 10.6* 9.8* 11.6* 10.9* 9.9*  HCT 32.8* 29.2* 34.0* 32.0* 30.1*  MCV 74.2* 72.3*  --   --  72.2*  PLT 315 225  --   --  [id number]   Basic Metabolic Panel: Recent Labs  Lab 01/13/22 0424 01/14/22 2128 01/14/22 2219 01/14/22 2245 01/15/22 0450  NA 137 134* 136 135 133*  K 3.9 3.6 3.6 3.8 4.0  CL 107 103 102  --  105  CO2 19* 21*  --   --  19*  GLUCOSE 97 119* 117*  --  113*  BUN 26* 27* 27*  --  26*  CREATININE 1.57* 2.40* 2.40*  --  2.00*  CALCIUM 9.4 8.7*  --   --  8.5*  MG  --  1.7  --   --   --    GFR: Estimated Creatinine Clearance: 43.4 mL/min (A) (by C-G formula based on SCr of 2 mg/dL (H)). Liver Function Tests: Recent Labs  Lab 01/13/22 0424 01/14/22 2128 01/15/22 0450  AST 16 14* 13*  ALT 21 13 13   ALKPHOS 67 56 53  BILITOT 0.6 1.0 0.8  PROT 7.8 7.0 6.6  ALBUMIN 3.2* 2.6* 2.5*   No results for input(s): "LIPASE", "AMYLASE" in the last 168 hours. Recent Labs  Lab 01/14/22 2302  AMMONIA 31   Coagulation Profile: Recent Labs  Lab 01/14/22 2128  INR 2.2*   Cardiac Enzymes: No results for input(s): "CKTOTAL", "CKMB", "CKMBINDEX", "TROPONINI" in the last 168 hours. BNP (last 3 results) No results for input(s): "PROBNP" in the last 8760 hours. HbA1C: No results for input(s): "HGBA1C" in the last 72  hours. CBG: Recent Labs  Lab 01/14/22 2140  GLUCAP 152*   Lipid Profile: No results for input(s): "CHOL", "HDL", "LDLCALC", "TRIG", "CHOLHDL", "LDLDIRECT" in the last 72 hours. Thyroid Function Tests: No results for input(s): "TSH", "T4TOTAL", "FREET4", "T3FREE", "THYROIDAB" in the last 72 hours. Anemia Panel: No results for input(s): "VITAMINB12", "FOLATE", "FERRITIN", "TIBC", "IRON", "RETICCTPCT" in the last 72 hours. Urine analysis:    Component Value Date/Time   COLORURINE YELLOW 01/01/2022 0037   APPEARANCEUR CLEAR 01/01/2022 0037   APPEARANCEUR Clear 03/06/2020 1551   LABSPEC 1.013 01/01/2022 0037  PHURINE 5.0 01/01/2022 0037   GLUCOSEU 50 (A) 01/01/2022 0037   HGBUR SMALL (A) 01/01/2022 0037   BILIRUBINUR NEGATIVE 01/01/2022 0037   BILIRUBINUR Negative 03/06/2020 1551   KETONESUR NEGATIVE 01/01/2022 0037   PROTEINUR 30 (A) 01/01/2022 0037   UROBILINOGEN 0.2 03/18/2014 2345   NITRITE NEGATIVE 01/01/2022 0037   LEUKOCYTESUR NEGATIVE 01/01/2022 0037   Sepsis Labs: @LABRCNTIP (procalcitonin:4,lacticidven:4)  ) Recent Results (from the past 240 hour(s))  Blood Culture (routine x 2)     Status: None (Preliminary result)   Collection Time: 01/14/22 10:11 PM   Specimen: BLOOD  Result Value Ref Range Status   Specimen Description BLOOD BLOOD RIGHT ARM  Final   Special Requests   Final    BOTTLES DRAWN AEROBIC AND ANAEROBIC Blood Culture results may not be optimal due to an inadequate volume of blood received in culture bottles   Culture   Final    NO GROWTH < 12 HOURS Performed at [facility], [place] [address] [place], [place] [address]    Report Status PENDING  Incomplete         Radiology Studies: DG Tibia/Fibula Right  Result Date: 01/15/2022 CLINICAL DATA:  Lower extremity pain and swelling, initial encounter EXAM: RIGHT TIBIA AND FIBULA - 2 VIEW COMPARISON:  None Available. FINDINGS: No acute fracture or dislocation is noted. Diffuse vascular  calcifications are seen. Soft tissues show no focal abnormality. No joint effusion is seen. IMPRESSION: No acute bony abnormality noted. Electronically Signed   By: Inez Catalina M.D.   On: 01/15/2022 01:04   DG Ankle 2 Views Right  Result Date: 01/15/2022 CLINICAL DATA:  Leg pain and swelling, initial encounter EXAM: RIGHT ANKLE - 2 VIEW COMPARISON:  None Available. FINDINGS: Calcaneal spurring is noted. Diffuse vascular calcifications are seen. No acute fracture or dislocation is noted. IMPRESSION: No acute abnormality noted. Electronically Signed   By: Inez Catalina M.D.   On: 01/15/2022 01:02   CT Angio Chest/Abd/Pel for Dissection W and/or Wo Contrast  Result Date: 01/14/2022 CLINICAL DATA:  Concern for bowel ischemia and pulmonary embolus. Altered mental status. Hypotension. EXAM: CT ANGIOGRAPHY CHEST, ABDOMEN AND PELVIS TECHNIQUE: Non-contrast CT of the chest was initially obtained. Multidetector CT imaging through the chest, abdomen and pelvis was performed using the standard protocol during bolus administration of intravenous contrast. Multiplanar reconstructed images and MIPs were obtained and reviewed to evaluate the vascular anatomy. RADIATION DOSE REDUCTION: This exam was performed according to the departmental dose-optimization program which includes automated exposure control, adjustment of the mA and/or kV according to patient size and/or use of iterative reconstruction technique. CONTRAST:  51mL OMNIPAQUE IOHEXOL 350 MG/ML SOLN COMPARISON:  Radiograph earlier today. Abdominopelvic CT 07/18/2015. Prior chest CTs reviewed FINDINGS: CTA CHEST FINDINGS Cardiovascular: Tortuous thoracic aorta. Mild atherosclerosis. No aortic hematoma on unenhanced exam. No dissection or acute aortic findings. No evidence of acute aortic syndrome. Conventional branching pattern from the aortic arch with patent branch vessels. Excellent opacification of the pulmonary arteries, no pulmonary embolus. Mild  cardiomegaly. No pericardial effusion. Mediastinum/Nodes: Scattered small mediastinal lymph nodes, not enlarged by size criteria. No hilar adenopathy. No esophageal wall thickening. Lungs/Pleura: Mild emphysema. No acute airspace disease. Mild hypoventilatory changes in the dependent lungs. No features of pulmonary edema. No pleural fluid. Trachea and central bronchi are patent. Musculoskeletal: There are no acute or suspicious osseous abnormalities. No chest wall soft tissue abnormalities. Review of the MIP images confirms the above findings. CTA ABDOMEN AND PELVIS FINDINGS VASCULAR Aorta: Moderate  atherosclerosis. Noncalcified plaque in the distal aorta causes less than 25% luminal narrowing. No dissection, aneurysm or acute findings. Celiac: Patent without evidence of aneurysm, dissection, vasculitis or significant stenosis. SMA: Mild noncalcified plaque at the origin without significant stenosis. Distal branch vessels are patent. No evidence of embolic disease. No dissection or acute findings. Renals: Single right and 2 left renal arteries. All renal arteries are patent. No dissection or acute findings. IMA: Patent without evidence of aneurysm, dissection, vasculitis or significant stenosis. Inflow: The left common iliac artery is tortuous and atherosclerotic with a short segment dissection series 5, image 227. Moderate atheromatous plaque throughout the right internal and external iliac arteries. 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 Veins: Slight contrast refluxing into the hepatic veins and IVC. No other acute venous findings. No portal venous or mesenteric gas. Review of the MIP images confirms the above findings. NON-VASCULAR Hepatobiliary: No evidence of focal hepatic abnormality on this arterial phase exam. Liver is mildly enlarged spanning 18.7 cm cranial caudal. Partially distended gallbladder. No biliary dilatation. Pancreas: No ductal dilatation or inflammation.  Spleen: Normal in size without focal abnormality. There are multiple splenules adjacent to the spleen. Adrenals/Urinary Tract: Slight adrenal thickening but no adrenal nodule. No hydronephrosis. Small renal cysts, needing no specific imaging follow-up. No evidence of solid renal lesion. Unremarkable urinary bladder. Stomach/Bowel: The stomach is nondistended. There is no bowel obstruction. No bowel inflammation or pneumatosis. No mesenteric edema. Scattered fluid within small bowel as well as ascending and transverse colon. No associated inflammatory change. Lymphatic: Prominent right external iliac node measures 14 mm short axis series 5, image 263. There is a 14 mm right upper angle node series 5, image 274. 1.5 cm low right inguinal node. There additional prominent bilateral external iliac and retroperitoneal lymph nodes. Reproductive: Prostate is unremarkable. Other: No free air. No ascites. Small fat containing umbilical hernia. Minimal fat in the inguinal canals. Musculoskeletal: Mild lower lumbar facet hypertrophy. Review of the MIP images confirms the above findings. IMPRESSION: 1. No aortic dissection or acute aortic findings. No pulmonary embolus. 2. 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 3. Scattered fluid within small bowel as well as ascending and transverse colon, can be seen with diarrheal illness. No evidence of bowel ischemia or inflammation 4. Mild hepatomegaly. 5. Moderate external iliac and inguinal adenopathy. Additional prominent bilateral iliac and retroperitoneal lymph nodes, these nodes were also seen on remote prior exam. Findings are likely reactive, but nonspecific. Aortic Atherosclerosis (ICD10-I70.0) and Emphysema (ICD10-J43.9). Electronically Signed   By: Keith Rake M.D.   On: 01/14/2022 23:46   CT Head Wo Contrast  Result Date: 01/14/2022 CLINICAL DATA:   Altered mental status and possible sepsis EXAM: CT HEAD WITHOUT CONTRAST TECHNIQUE: Contiguous axial images were obtained from the base of the skull through the vertex without intravenous contrast. RADIATION DOSE REDUCTION: This exam was performed according to the departmental dose-optimization program which includes automated exposure control, adjustment of the mA and/or kV according to patient size and/or use of iterative reconstruction technique. COMPARISON:  01/01/2022 FINDINGS: Brain: No evidence of acute infarction, hemorrhage, hydrocephalus, extra-axial collection or mass lesion/mass effect. Mild chronic white matter ischemic changes are seen. Vascular: No hyperdense vessel or unexpected calcification. Skull: Normal. Negative for fracture or focal lesion. Sinuses/Orbits: No acute finding. Other: None. IMPRESSION: Chronic white matter ischemic changes without acute abnormality. Electronically  Signed   By: Inez Catalina M.D.   On: 01/14/2022 23:35   DG Knee 2 Views Right  Result Date: 01/14/2022 CLINICAL DATA:  Swollen right knee. EXAM: RIGHT KNEE - 1-2 VIEW COMPARISON:  None Available. FINDINGS: No evidence of fracture or dislocation. Mild medial tibiofemoral joint space narrowing. Moderate patellofemoral joint space narrowing with marginal spurring and small suprapatellar joint effusion. Soft tissues are unremarkable. IMPRESSION: 1. No acute fracture or dislocation. 2. 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 Electronically Signed   By: Keane Police D.O.   On: 01/14/2022 22:17   DG Chest Port 1 View  Result Date: 01/14/2022 CLINICAL DATA:  Questionable sepsis - evaluate for abnormality EXAM: PORTABLE CHEST 1 VIEW COMPARISON:  Radiograph 2 days ago.  Chest CT 06/05/2018 FINDINGS: Unchanged cardiomegaly. Stable mediastinal contours. Aortic atherosclerosis. Slight vascular congestion without pulmonary edema. No confluent consolidation. No pleural effusion. No pneumothorax.  IMPRESSION: Cardiomegaly with slight vascular congestion. Electronically Signed   By: Keith Rake M.D.   On: 01/14/2022 22:01        Scheduled Meds:  apixaban  5 mg Oral BID   aspirin EC  81 mg Oral Daily   atorvastatin  20 mg Oral Daily   ferrous sulfate  325 mg Oral QODAY   vancomycin variable dose per unstable renal function (pharmacist dosing)   Does not apply See admin instructions   Continuous Infusions:  ceFEPime (MAXIPIME) IV Stopped (01/15/22 1030)   lactated ringers 75 mL/hr at 01/15/22 1031   norepinephrine (LEVOPHED) Adult infusion Stopped (01/14/22 2242)     LOS: 0 days    Time spent: 55 minutes.    Dana Allan, MD  [facility] Pager #: [phone] 7PM-7AM contact night coverage as above

## 2022-01-15 NOTE — ED Notes (Signed)
The pt is asleep iv infusing at the specified rate

## 2022-01-15 NOTE — Progress Notes (Addendum)
  X-cover Note: [employer] by [employer]. Pt with HR 110-150s. EKG shows rapid afib. Start cardizem gtts.    Kristopher Oppenheim, DO [facility]
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
## 2022-01-15 NOTE — Procedures (Signed)
Procedure: Right knee aspiration    Indication: Right knee effusion(s)   Surgeon: Rushie Nyhan, PA-C   Assist: None   Anesthesia: 50mL Lidocaine 1%   EBL: None   Complications: None   Findings: After risks/benefits explained patient desires to undergo procedure. Consent obtained and time out performed. The right knee was sterilely prepped and aspirated. Pt tolerated the procedure well.       Gwinda Passe PA-C [facility] [phone] (office) [email or web address]
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
## 2022-01-15 NOTE — Progress Notes (Signed)
Pharmacy Antibiotic Note  [name] is a 61 y.o. male admitted on 01/14/2022 with sepsis.  Pharmacy has been consulted for vancomycin dosing.  Pt w/ AKI; baseline SCr ~1.5, now 2.4.  Plan: Rec'd linezolid in ED; will give vancomycin 1500mg  IV x1 and monitor SCr +/- vanc levels prior to redosing. Cefepime 2g IV Q8H ordered by MD; will change to Q12H for renal function.  Height: 5\' 9"  (175.3 cm) Weight: 94.3 kg (207 lb 14.3 oz) IBW/kg (Calculated) : 70.7  Temp (24hrs), Avg:100.1 F (37.8 C), Min:100 F (37.8 C), Max:100.2 F (37.9 C)  Recent Labs  Lab 01/13/22 0424 01/13/22 1152 01/14/22 2128 01/14/22 2219  WBC 5.5  --  14.2*  --   CREATININE 1.57*  --  2.40* 2.40*  LATICACIDVEN  --  2.4* 1.5  --     Estimated Creatinine Clearance: 36.2 mL/min (A) (by C-G formula based on SCr of 2.4 mg/dL (H)).    Allergies  Allergen Reactions   Adhesive [Tape] Other (See Comments)    Makes the skin feel as if it is burning, will also bruise the skin. Pt. prefers paper tape   Latex Hives, Itching and Other (See Comments)    Burns skin, also     Thank you for allowing pharmacy to be a part of this patient's care.  Wynona Neat, PharmD, BCPS  01/15/2022 1:57 AM
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
## 2022-01-16 ENCOUNTER — Encounter (HOSPITAL_COMMUNITY): Payer: Self-pay | Admitting: Internal Medicine

## 2022-01-16 ENCOUNTER — Other Ambulatory Visit: Payer: Self-pay

## 2022-01-16 ENCOUNTER — Inpatient Hospital Stay (HOSPITAL_COMMUNITY): Payer: Medicare Other

## 2022-01-16 DIAGNOSIS — R6521 Severe sepsis with septic shock: Secondary | ICD-10-CM | POA: Diagnosis not present

## 2022-01-16 DIAGNOSIS — A419 Sepsis, unspecified organism: Secondary | ICD-10-CM | POA: Diagnosis not present

## 2022-01-16 LAB — RESPIRATORY PANEL BY PCR

## 2022-01-16 LAB — PROTIME-INR
INR: 1.9 — ABNORMAL HIGH (ref 0.8–1.2)
Prothrombin Time: 21.7 seconds — ABNORMAL HIGH (ref 11.4–15.2)

## 2022-01-16 LAB — COMPREHENSIVE METABOLIC PANEL
ALT: 13 U/L (ref 0–44)
AST: 12 U/L — ABNORMAL LOW (ref 15–41)
Albumin: 2.3 g/dL — ABNORMAL LOW (ref 3.5–5.0)
Alkaline Phosphatase: 49 U/L (ref 38–126)
Anion gap: 9 (ref 5–15)
BUN: 22 mg/dL (ref 8–23)
CO2: 20 mmol/L — ABNORMAL LOW (ref 22–32)
Calcium: 8.9 mg/dL (ref 8.9–10.3)
Chloride: 108 mmol/L (ref 98–111)
Creatinine, Ser: 1.62 mg/dL — ABNORMAL HIGH (ref 0.61–1.24)
GFR, Estimated: 48 mL/min — ABNORMAL LOW (ref >60)
Glucose, Bld: 96 mg/dL (ref 70–99)
Potassium: 3.8 mmol/L (ref 3.5–5.1)
Sodium: 137 mmol/L (ref 135–145)
Total Bilirubin: 0.9 mg/dL (ref 0.3–1.2)
Total Protein: 6.7 g/dL (ref 6.5–8.1)

## 2022-01-16 LAB — MAGNESIUM: Magnesium: 1.9 mg/dL (ref 1.7–2.4)

## 2022-01-16 LAB — PROCALCITONIN: Procalcitonin: 4.64 ng/mL

## 2022-01-16 LAB — BRAIN NATRIURETIC PEPTIDE: B Natriuretic Peptide: 528 pg/mL — ABNORMAL HIGH (ref 0.0–100.0)

## 2022-01-16 LAB — CBC WITH DIFFERENTIAL/PLATELET
Abs Immature Granulocytes: 0.04 10*3/uL (ref 0.00–0.07)
Basophils Absolute: 0 10*3/uL (ref 0.0–0.1)
Basophils Relative: 1 %
Eosinophils Absolute: 0.1 10*3/uL (ref 0.0–0.5)
Eosinophils Relative: 1 %
HCT: 26.4 % — ABNORMAL LOW (ref 39.0–52.0)
Hemoglobin: 9.2 g/dL — ABNORMAL LOW (ref 13.0–17.0)
Immature Granulocytes: 1 %
Lymphocytes Relative: 16 %
Lymphs Abs: 1.3 10*3/uL (ref 0.7–4.0)
MCH: 24.4 pg — ABNORMAL LOW (ref 26.0–34.0)
MCHC: 34.8 g/dL (ref 30.0–36.0)
MCV: 70 fL — ABNORMAL LOW (ref 80.0–100.0)
Monocytes Absolute: 1.2 10*3/uL — ABNORMAL HIGH (ref 0.1–1.0)
Monocytes Relative: 14 %
Neutro Abs: 5.5 10*3/uL (ref 1.7–7.7)
Neutrophils Relative %: 67 %
Platelets: 201 10*3/uL (ref 150–400)
RBC: 3.77 MIL/uL — ABNORMAL LOW (ref 4.22–5.81)
RDW: 15.9 % — ABNORMAL HIGH (ref 11.5–15.5)
WBC: 8.1 10*3/uL (ref 4.0–10.5)
nRBC: 0 % (ref 0.0–0.2)

## 2022-01-16 LAB — PHOSPHORUS: Phosphorus: 3.1 mg/dL (ref 2.5–4.6)

## 2022-01-16 LAB — C-REACTIVE PROTEIN: CRP: 19 mg/dL — ABNORMAL HIGH (ref <1.0)

## 2022-01-16 MED ORDER — DIPHENHYDRAMINE HCL 25 MG PO CAPS
25.0000 mg | ORAL_CAPSULE | Freq: Once | ORAL | Status: AC
  Administered 2022-01-16: 25 mg via ORAL
  Filled 2022-01-16: qty 1

## 2022-01-16 MED ORDER — VANCOMYCIN HCL IN DEXTROSE 1-5 GM/200ML-% IV SOLN
1000.0000 mg | INTRAVENOUS | Status: DC
  Administered 2022-01-16 – 2022-01-18 (×3): 1000 mg via INTRAVENOUS
  Filled 2022-01-16 (×4): qty 200

## 2022-01-16 MED ORDER — MORPHINE SULFATE (PF) 2 MG/ML IV SOLN: 2.0000 mg | Freq: Once | INTRAVENOUS | Status: DC | PRN

## 2022-01-16 MED ORDER — DILTIAZEM HCL 60 MG PO TABS
30.0000 mg | ORAL_TABLET | Freq: Four times a day (QID) | ORAL | Status: DC
  Administered 2022-01-16 – 2022-01-19 (×12): 30 mg via ORAL
  Filled 2022-01-16 (×12): qty 1

## 2022-01-16 MED ORDER — CARVEDILOL 25 MG PO TABS
25.0000 mg | ORAL_TABLET | Freq: Two times a day (BID) | ORAL | Status: DC
  Administered 2022-01-16 – 2022-01-24 (×16): 25 mg via ORAL
  Filled 2022-01-16 (×16): qty 1

## 2022-01-16 MED ORDER — HYDROMORPHONE HCL 1 MG/ML IJ SOLN
0.2500 mg | INTRAMUSCULAR | Status: AC | PRN
  Administered 2022-01-16: 0.25 mg via INTRAVENOUS
  Filled 2022-01-16: qty 1

## 2022-01-16 MED ORDER — IRBESARTAN 150 MG PO TABS
300.0000 mg | ORAL_TABLET | Freq: Every day | ORAL | Status: DC
  Administered 2022-01-16 – 2022-01-25 (×10): 300 mg via ORAL
  Filled 2022-01-16 (×10): qty 2

## 2022-01-16 NOTE — Progress Notes (Signed)
   01/15/22 2114  Assess: MEWS Score  Temp 98.5 F (36.9 C)  BP (!) 142/88  MAP (mmHg) 104  Pulse Rate (!) 108  ECG Heart Rate (!) 102  Resp (!) 22  Level of Consciousness Alert  SpO2 97 %  O2 Device Room Air  Assess: MEWS Score  MEWS Temp 0  MEWS Systolic 0  MEWS Pulse 1  MEWS RR 1  MEWS LOC 0  MEWS Score 2  MEWS Score Color Yellow  Assess: if the MEWS score is Yellow or Red  Were vital signs taken at a resting state? Yes  Focused Assessment No change from prior assessment  Does the patient meet 2 or more of the SIRS criteria? Yes  Does the patient have a confirmed or suspected source of infection? Yes  Provider and Rapid Response Notified? Yes  MEWS guidelines implemented *See Row Information* No, previously yellow, continue vital signs every 4 hours  Treat  Pain Scale 0-10  Pain Score 6  Take Vital Signs  Increase Vital Sign Frequency  Yellow: Q 2hr X 2 then Q 4hr X 2, if remains yellow, continue Q 4hrs  Escalate  MEWS: Escalate Yellow: discuss with charge nurse/RN and consider discussing with provider and RRT  Notify: Charge Nurse/RN  Name of Charge Nurse/RN Notified Deborah, RN  Date Charge Nurse/RN Notified 01/16/22  Time Charge Nurse/RN Notified 2359  Notify: Provider  Provider Name/Title E CHEN  Date Provider Notified 01/15/22  Time Provider Notified 2230  Method of Notification  (secured chat)  Notification Reason Change in status (High HR 110-150's AF)  Provider response See new orders  Date of Provider Response 01/24/22  Time of Provider Response 2230  Document  Patient Outcome Not stable and remains on department  Progress note created (see row info) Yes  Assess: SIRS CRITERIA  SIRS Temperature  0  SIRS Pulse 1  SIRS Respirations  1  SIRS WBC 1  SIRS Score Sum  3

## 2022-01-16 NOTE — Evaluation (Signed)
Physical Therapy Evaluation Patient Details Name: [name] MRN: [id number] DOB: 1960-11-25 Today's Date: 01/16/2022  History of Present Illness  Pt is a 61 y.o. male who presented 01/14/22 with progressive pain in R knee and weakness. S/p R knee aspiration 10/14. Patient recently hospitalized 01/01/2022 through 01/06/2022 with diagnosis of sepsis due to group A streptococcus with acute renal failure in the setting of lower extremity cellulitis and swollen knee.  At the time septic arthritis was ruled out. PMH: HTN, CHF, HLD, gout, CKD, afib, PAD, aortic stenosis, cellulitis, chronic back pain.   Clinical Impression  Pt presents with condition above and deficits mentioned below, see PT Problem List. Prior to onset of knee pain, he was independent without DME, but recently has been using a RW and needing increasing assistance for functional mobility due to the pain. He lives with his family in a 1-level house with 3 STE. Currently, pt is hypersensitive to touch throughout his R lower extremity and displays R knee edema and pain that limit his R knee ROM and overall R leg strength. He displays deficits in balance and activity tolerance. He is primarily limited by his R knee pain, requiring minA for bed mobility and transfers and min guard assist to ambulate with a RW (not placing much if any weight on his R leg). Distance in which pt ambulated today was limited by transport arriving to take pt to MRI. Recommending follow-up with HHPT and family support. Will continue to follow acutely.     Recommendations for follow up therapy are one component of a multi-disciplinary discharge planning process, led by the attending physician.  Recommendations may be updated based on patient status, additional functional criteria and insurance authorization.  Follow Up Recommendations Home health PT      Assistance Recommended at Discharge Intermittent Supervision/Assistance  Patient can return home with the  following  A little help with bathing/dressing/bathroom;Help with stairs or ramp for entrance;Assist for transportation;Assistance with cooking/housework;A little help with walking and/or transfers    Equipment Recommendations None recommended by PT  Recommendations for Other Services       Functional Status Assessment Patient has had a recent decline in their functional status and demonstrates the ability to make significant improvements in function in a reasonable and predictable amount of time.     Precautions / Restrictions Precautions Precautions: Fall Precaution Comments: R leg hypersensitive Restrictions Weight Bearing Restrictions: No Other Position/Activity Restrictions: WBAT and ROM as tolerated to R leg      Mobility  Bed Mobility Overal bed mobility: Needs Assistance Bed Mobility: Supine to Sit, Sit to Supine     Supine to sit: Min assist, HOB elevated Sit to supine: Min assist, HOB elevated   General bed mobility comments: MinA for pt to pull on PT's hand to ascend trunk and minA to manage R leg off EOB. MinA managing R leg back to supine    Transfers Overall transfer level: Needs assistance Equipment used: Rolling walker (2 wheels) Transfers: Sit to/from Stand Sit to Stand: Min assist           General transfer comment: Extra time to power up to stand and minA to steady and hold RW still while pt came to stand, placing R leg anterior to the L, limited by pain    Ambulation/Gait Ambulation/Gait assistance: Min guard Gait Distance (Feet): 8 Feet Assistive device: Rolling walker (2 wheels) Gait Pattern/deviations: Step-to pattern, Decreased stance time - right, Decreased stride length, Antalgic, Trunk flexed Gait velocity: reduced Gait  velocity interpretation: <1.31 ft/sec, indicative of household ambulator   General Gait Details: Pt with very slow, antalgic gait pattern, not tolerating much if any weight on his R lower extremity. Distance limited by  transport arriving to take pt to MRI. No LOB, min guard for safety  Stairs            Wheelchair Mobility    Modified Rankin (Stroke Patients Only)       Balance Overall balance assessment: Needs assistance Sitting-balance support: No upper extremity supported, Feet supported Sitting balance-Leahy Scale: Good     Standing balance support: Bilateral upper extremity supported, During functional activity, Reliant on assistive device for balance Standing balance-Leahy Scale: Poor Standing balance comment: Reliant on RW                             Pertinent Vitals/Pain Pain Assessment Pain Assessment: 0-10 Pain Score: 7  Pain Location: R knee Pain Descriptors / Indicators: Sharp Pain Intervention(s): Limited activity within patient's tolerance, Repositioned, Monitored during session, RN gave pain meds during session    Home Living Family/patient expects to be discharged to:: Private residence Living Arrangements: Spouse/significant other;Children Available Help at Discharge: Family;Available 24 hours/day Type of Home: House Home Access: Stairs to enter Entrance Stairs-Rails: Left Entrance Stairs-Number of Steps: 3   Home Layout: One level Home Equipment: Conservation officer, nature (2 wheels)      Prior Function Prior Level of Function : Independent/Modified Independent             Mobility Comments: IND without DME before knee pain began, as it has gotten worse he has been using a RW and needing more help with transfers       Hand Dominance   Dominant Hand: Left    Extremity/Trunk Assessment   Upper Extremity Assessment Upper Extremity Assessment: Defer to OT evaluation    Lower Extremity Assessment Lower Extremity Assessment: RLE deficits/detail RLE Deficits / Details: edema noted at knee, with edema and pain limiting knee ROM and tolerance with lower extremity mobility; hypersensitive to touch throughout leg; WFL AROM at ankle RLE Sensation:   (hypersensitive)    Cervical / Trunk Assessment Cervical / Trunk Assessment: Normal  Communication   Communication: No difficulties  Cognition Arousal/Alertness: Awake/alert Behavior During Therapy: WFL for tasks assessed/performed Overall Cognitive Status: Within Functional Limits for tasks assessed                                          General Comments General comments (skin integrity, edema, etc.): HR up to 120s    Exercises     Assessment/Plan    PT Assessment Patient needs continued PT services  PT Problem List Decreased strength;Decreased balance;Decreased activity tolerance;Decreased mobility;Pain;Decreased range of motion;Impaired sensation       PT Treatment Interventions DME instruction;Gait training;Stair training;Functional mobility training;Therapeutic activities;Therapeutic exercise;Balance training;Patient/family education;Neuromuscular re-education    PT Goals (Current goals can be found in the Care Plan section)  Acute Rehab PT Goals Patient Stated Goal: to get better PT Goal Formulation: With patient Time For Goal Achievement: 01/30/22 Potential to Achieve Goals: Good    Frequency Min 3X/week     Co-evaluation               AM-PAC PT "6 Clicks" Mobility  Outcome Measure Help needed turning from your back to your side while  in a flat bed without using bedrails?: A Little Help needed moving from lying on your back to sitting on the side of a flat bed without using bedrails?: A Little Help needed moving to and from a bed to a chair (including a wheelchair)?: A Little Help needed standing up from a chair using your arms (e.g., wheelchair or bedside chair)?: A Little Help needed to walk in hospital room?: A Little (<20 ft due to transport arriving) Help needed climbing 3-5 steps with a railing? : A Lot 6 Click Score: 17    End of Session Equipment Utilized During Treatment: Gait belt Activity Tolerance: Patient limited by  pain;Other (comment) (limited by transport arriving to bring pt to MRI) Patient left: in bed;Other (comment);with nursing/sitter in room (with transport taking pt to MRI) Nurse Communication: Mobility status PT Visit Diagnosis: Unsteadiness on feet (R26.81);Muscle weakness (generalized) (M62.81);Difficulty in walking, not elsewhere classified (R26.2);Pain;Other abnormalities of gait and mobility (R26.89) Pain - Right/Left: Right Pain - part of body: Knee    Time: 1232-1252 PT Time Calculation (min) (ACUTE ONLY): 20 min   Charges:   PT Evaluation $PT Eval Moderate Complexity: 1 Mod          Moishe Spice, PT, DPT Acute Rehabilitation Services  Office: [phone]   Orvan Falconer 01/16/2022, 1:49 PM

## 2022-01-16 NOTE — Progress Notes (Signed)
Pharmacy Antibiotic Note-Follow Up  [name] is a 61 y.o. male admitted on 01/14/2022 with sepsis.  Pharmacy has been consulted for vancomycin dosing.  Pt w/ AKI resolving with Cr 2.4 to 1.6. Discussed PMH of GAS and use of linezolid with no follow up at this time. WBC 8.1, CRP 19, and afebrile. Patient did receive dose of linezolid on the 14th  Plan: Vancomycin 1000mg  q 24h (eAUC 486, IBW, Vd 0.5) Cefepime 2g IV Q12H for renal function. Monitor signs of clinical improvement, renal function, WBC, fever curve F/u switching to linezolid if worried for toxin production  Height: 5\' 9"  (175.3 cm) Weight: 94.3 kg (207 lb 14.3 oz) IBW/kg (Calculated) : 70.7  Temp (24hrs), Avg:98.9 F (37.2 C), Min:97.7 F (36.5 C), Max:100.3 F (37.9 C)  Recent Labs  Lab 01/13/22 0424 01/13/22 1152 01/14/22 2128 01/14/22 2219 01/15/22 0450 01/16/22 0253  WBC 5.5  --  14.2*  --  11.8* 8.1  CREATININE 1.57*  --  2.40* 2.40* 2.00* 1.62*  LATICACIDVEN  --  2.4* 1.5  --   --   --      Estimated Creatinine Clearance: 53.6 mL/min (A) (by C-G formula based on SCr of 1.62 mg/dL (H)).    Allergies  Allergen Reactions   Adhesive [Tape] Other (See Comments)    Makes the skin feel as if it is burning, will also bruise the skin. Pt. prefers paper tape   Latex Hives, Itching and Other (See Comments)    Burns skin, also   Cultures Fluid Cx: NG Bcx: NG Resp Panel: negative  Antibiotics since admission Vanc 10/14>> Linezolid 10/14>>10/14 Cefepime 10/14>>  Sandford Craze, PharmD. [facility] PGY-1  01/16/2022 12:18 PM

## 2022-01-16 NOTE — Progress Notes (Signed)
PROGRESS NOTE    [name]  [id number] DOB: 1961/03/18 DOA: 01/14/2022 PCP: [name], MD  Outpatient Specialists:     Brief Narrative:  As per H&P done on admission: "[name] is a 61 y.o. male with medical history significant of  dCHF, AF on Eliquis, CKD IIIb baseline 1.3-1.4, chronic pain, HTN, HLD, and OSA not on CPAP, who has recent interim history of hospitalization 01/01/22-01/06/22 with diagnosis of Sepsis due to group A Streptococcus with acute renal failure in setting of lower extremity cellulitis as well as swollen knee that at that time was ruled out of septic arthritis per ortho and was at that time treated with steroid injections in setting of fluid analysis showing WBC of 46.Marland Kitchen Patient returns to ed for second time in 48 hours with complaint of progressive painful right knee, as well as, progressive generalized weakness and change in mental status.  Patient is currently , lethargic and not able to give history at this time or complete ros.   ED Course:  ON evaluation , patient noted to meet criteria for sepsis   Temp 100.2,  bp 120 /70 with nadir [id number] systolic,  sat 0000000  Wbc 14 with left shift, inr 2.2,lacti 1.5, cr 2.40 up from 1.5, Mag 1.7 patient s/p 3 L still with lower bp , however prior to starting pressors bp  stablized.   Cxr noted mild vascular congestion DG Knee : 1. No acute fracture or dislocation. 2. 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  Patient will monitored on progressive care with low threshold for transfer to ICU if patient has further decline".  01/15/2022: Patient seen.  Patient is unable to give detailed history.  Patient is quite sleepy.  Orthopedic team consulted.  Plan is for aspiration of the right knee joint effusion for further analysis.   Will monitor, will continue IV antibiotics for now.  01/16/2022: Patient went into A-fib RVR last night.  Orthopedic input is appreciated.  Patient was started on Cardizem drip.  Heart rate is controlled.  Will transition to oral Cardizem.  We will resume patient's home antihypertensive medication.  Blood pressure is elevated.  Orthopedic team plans MRI of the right knee.   Assessment & Plan:   Principal Problem:   Severe sepsis with septic shock (HCC)   Severe sepsis with septic shock: -Possible right knee septic arthritis. -Follow cultures. -Continue vancomycin and cefepime. -Orthopedic input is appreciated.   -Arthrocentesis tried yesterday. -For MRI of the right knee.   -Critical care input is appreciated as well.   Chronic kidney disease stage IIIb:  -Continue to avoid nephrotoxins. -Monitor renal function and electrolytes closely. -Dose all medications considering impaired renal function.     Diastolic CHF: -no overt exacerbation  -although noted mild congestion on CXR  -will monitor closely  -gentle ivfs -if patient has recurrent hypotension will need vasopressor support       AF with RVR: -Eliquis has been restarted. -Patient was on Cardizem drip.  Heart rate is controlled.  Will change to oral Cardizem. -Consult cardiology.  Patient also has history of severe aortic stenosis  Severe aortic stenosis: -See above -Consult cardiology team. -A-fib with RVR, now controlled. -Surgical raised plan.  coagulopathy  -per critical care related to Eliquis not sepsis - hold eliquis -monitor inr  daily    Uncontrolled hypertension: -Resume Coreg. -Resume olmesartan.  Hyperlipidemia -resume statin one patient tolerating full diet   OSA: -not on cpap  -nocturnal O2   Chronic pain: -supportive care    DVT prophylaxis: Consider starting anticoagulation after right knee joint aspiration Code Status: Full code Family Communication:  Disposition Plan: This will  depend on hospital course   Consultants:  Critical care for septic shock Orthopedic team for possible septic arthritis of the right knee  Procedures:  Orthopedic team plans right knee aspiration  Antimicrobials:  IV vancomycin IV cefepime   Subjective: Patient is awake and alert today. -More communicative today. -Patient is better today.  Objective: Vitals:   01/15/22 2350 01/16/22 0550 01/16/22 0802 01/16/22 1123  BP: (!) 130/96 (!) 142/99 (!) 146/94 (!) 151/109  Pulse:  89 87 92  Resp: 18 19 15 20   Temp: 99 F (37.2 C) 100.3 F (37.9 C) 99 F (37.2 C) 98.8 F (37.1 C)  TempSrc: Oral Oral Oral Oral  SpO2: 97% 98% 97% 98%  Weight:      Height:        Intake/Output Summary (Last 24 hours) at 01/16/2022 1205 Last data filed at 01/16/2022 [id number] Gross per 24 hour  Intake 1324.97 ml  Output 500 ml  Net 824.97 ml   Filed Weights   01/15/22 0130  Weight: 94.3 kg    Examination:  General exam: Awake and alert.  Not in any distress.     Respiratory system: Clear to auscultation.  Cardiovascular system: S1 & S2 heard, irregularly irregular. Gastrointestinal system: Abdomen is soft and nontender.  Central nervous system: Sleepy.  Moves all extremities.   Extremities: Swollen and warm right knee joint.  Chronic changes bilateral lower legs.  Superficial also right lower leg.    Data Reviewed: I have personally reviewed following labs and imaging studies  CBC: Recent Labs  Lab 01/13/22 0424 01/14/22 2128 01/14/22 2219 01/14/22 2245 01/15/22 0450 01/16/22 0253  WBC 5.5 14.2*  --   --  11.8* 8.1  NEUTROABS 3.1 10.7*  --   --   --  5.5  HGB 10.6* 9.8* 11.6* 10.9* 9.9* 9.2*  HCT 32.8* 29.2* 34.0* 32.0* 30.1* 26.4*  MCV 74.2* 72.3*  --   --  72.2* 70.0*  PLT 315 225  --   --  222 [id number]    Basic Metabolic Panel: Recent Labs  Lab 01/13/22 0424 01/14/22 2128 01/14/22 2219 01/14/22 2245 01/15/22 0450 01/16/22 0253  NA 137 134* 136 135 133* 137  K 3.9 3.6  3.6 3.8 4.0 3.8  CL 107 103 102  --  105 108  CO2 19* 21*  --   --  19* 20*  GLUCOSE 97 119* 117*  --  113* 96  BUN 26* 27* 27*  --  26* 22  CREATININE 1.57* 2.40* 2.40*  --  2.00* 1.62*  CALCIUM 9.4 8.7*  --   --  8.5* 8.9  MG  --  1.7  --   --   --  1.9  PHOS  --   --   --   --   --  3.1    GFR: Estimated Creatinine Clearance: 53.6 mL/min (A) (by C-G formula based on SCr of 1.62 mg/dL (H)). Liver Function Tests: Recent Labs  Lab 01/13/22 0424 01/14/22 2128 01/15/22 0450 01/16/22 0253  AST 16 14* 13* 12*  ALT 21 13 13 13   ALKPHOS 67 56 53 49  BILITOT 0.6 1.0 0.8 0.9  PROT 7.8 7.0 6.6 6.7  ALBUMIN 3.2* 2.6* 2.5* 2.3*    No results for input(s): "LIPASE", "AMYLASE" in the last 168 hours. Recent Labs  Lab 01/14/22 2302  AMMONIA 31    Coagulation Profile: Recent Labs  Lab 01/14/22 2128 01/16/22 0947  INR 2.2* 1.9*    Cardiac Enzymes: No results for input(s): "CKTOTAL", "CKMB", "CKMBINDEX", "TROPONINI" in the last 168 hours. BNP (last 3 results) No results for input(s): "PROBNP" in the last 8760 hours. HbA1C: No results for input(s): "HGBA1C" in the last 72 hours. CBG: Recent Labs  Lab 01/14/22 2140  GLUCAP 152*    Lipid Profile: No results for input(s): "CHOL", "HDL", "LDLCALC", "TRIG", "CHOLHDL", "LDLDIRECT" in the last 72 hours. Thyroid Function Tests: No results for input(s): "TSH", "T4TOTAL", "FREET4", "T3FREE", "THYROIDAB" in the last 72 hours. Anemia Panel: No results for input(s): "VITAMINB12", "FOLATE", "FERRITIN", "TIBC", "IRON", "RETICCTPCT" in the last 72 hours. Urine analysis:    Component Value Date/Time   COLORURINE YELLOW 01/01/2022 0037   APPEARANCEUR CLEAR 01/01/2022 0037   APPEARANCEUR Clear 03/06/2020 1551   LABSPEC 1.013 01/01/2022 0037   PHURINE 5.0 01/01/2022 0037   GLUCOSEU 50 (A) 01/01/2022 0037   HGBUR SMALL (A) 01/01/2022 0037   BILIRUBINUR NEGATIVE 01/01/2022 0037   BILIRUBINUR Negative 03/06/2020 1551   KETONESUR  NEGATIVE 01/01/2022 0037   PROTEINUR 30 (A) 01/01/2022 0037   UROBILINOGEN 0.2 03/18/2014 2345   NITRITE NEGATIVE 01/01/2022 0037   LEUKOCYTESUR NEGATIVE 01/01/2022 0037   Sepsis Labs: @LABRCNTIP (procalcitonin:4,lacticidven:4)  ) Recent Results (from the past 240 hour(s))  Blood Culture (routine x 2)     Status: None (Preliminary result)   Collection Time: 01/14/22 10:11 PM   Specimen: BLOOD  Result Value Ref Range Status   Specimen Description BLOOD BLOOD RIGHT ARM  Final   Special Requests   Final    BOTTLES DRAWN AEROBIC AND ANAEROBIC Blood Culture results may not be optimal due to an inadequate volume of blood received in culture bottles   Culture   Final    NO GROWTH < 12 HOURS Performed at [facility], [place] [address] [place], [place] [address]    Report Status PENDING  Incomplete  Body fluid culture w Gram Stain     Status: None (Preliminary result)   Collection Time: 01/15/22  3:08 PM   Specimen: Body Fluid  Result Value Ref Range Status   Specimen Description FLUID  Final   Special Requests RIGHT KNEE ASPIRATION  Final   Gram Stain   Final    MODERATE WBC PRESENT, PREDOMINANTLY PMN NO ORGANISMS SEEN    Culture   Final    NO GROWTH < 24 HOURS Performed at [facility] [address] [place], [place] [address]    Report Status PENDING  Incomplete  Respiratory (~20 pathogens) panel by PCR     Status: None   Collection Time: 01/16/22 12:03 AM   Specimen: Nasopharyngeal Swab; Respiratory  Result Value Ref Range Status   Adenovirus NOT DETECTED NOT DETECTED Final   Coronavirus 229E NOT DETECTED NOT DETECTED Final    Comment: (NOTE) The Coronavirus on the Respiratory Panel, DOES NOT test for the novel  Coronavirus (2019 nCoV)    Coronavirus HKU1 NOT DETECTED NOT DETECTED Final   Coronavirus NL63 NOT DETECTED NOT DETECTED Final   Coronavirus OC43 NOT DETECTED NOT DETECTED Final   Metapneumovirus NOT DETECTED NOT DETECTED Final   Rhinovirus /  Enterovirus NOT DETECTED NOT DETECTED Final  Influenza A NOT DETECTED NOT DETECTED Final   Influenza B NOT DETECTED NOT DETECTED Final   Parainfluenza Virus 1 NOT DETECTED NOT DETECTED Final   Parainfluenza Virus 2 NOT DETECTED NOT DETECTED Final   Parainfluenza Virus 3 NOT DETECTED NOT DETECTED Final   Parainfluenza Virus 4 NOT DETECTED NOT DETECTED Final   Respiratory Syncytial Virus NOT DETECTED NOT DETECTED Final   Bordetella pertussis NOT DETECTED NOT DETECTED Final   Bordetella Parapertussis NOT DETECTED NOT DETECTED Final   Chlamydophila pneumoniae NOT DETECTED NOT DETECTED Final   Mycoplasma pneumoniae NOT DETECTED NOT DETECTED Final    Comment: Performed at [facility], [place] [address] [place], [place] [address]         Radiology Studies: DG Tibia/Fibula Right  Result Date: 01/15/2022 CLINICAL DATA:  Lower extremity pain and swelling, initial encounter EXAM: RIGHT TIBIA AND FIBULA - 2 VIEW COMPARISON:  None Available. FINDINGS: No acute fracture or dislocation is noted. Diffuse vascular calcifications are seen. Soft tissues show no focal abnormality. No joint effusion is seen. IMPRESSION: No acute bony abnormality noted. Electronically Signed   By: Inez Catalina M.D.   On: 01/15/2022 01:04   DG Ankle 2 Views Right  Result Date: 01/15/2022 CLINICAL DATA:  Leg pain and swelling, initial encounter EXAM: RIGHT ANKLE - 2 VIEW COMPARISON:  None Available. FINDINGS: Calcaneal spurring is noted. Diffuse vascular calcifications are seen. No acute fracture or dislocation is noted. IMPRESSION: No acute abnormality noted. Electronically Signed   By: Inez Catalina M.D.   On: 01/15/2022 01:02   CT Angio Chest/Abd/Pel for Dissection W and/or Wo Contrast  Result Date: 01/14/2022 CLINICAL DATA:  Concern for bowel ischemia and pulmonary embolus. Altered mental status. Hypotension. EXAM: CT ANGIOGRAPHY CHEST, ABDOMEN AND PELVIS TECHNIQUE: Non-contrast CT of the chest was initially  obtained. Multidetector CT imaging through the chest, abdomen and pelvis was performed using the standard protocol during bolus administration of intravenous contrast. Multiplanar reconstructed images and MIPs were obtained and reviewed to evaluate the vascular anatomy. RADIATION DOSE REDUCTION: This exam was performed according to the departmental dose-optimization program which includes automated exposure control, adjustment of the mA and/or kV according to patient size and/or use of iterative reconstruction technique. CONTRAST:  80mL OMNIPAQUE IOHEXOL 350 MG/ML SOLN COMPARISON:  Radiograph earlier today. Abdominopelvic CT 07/18/2015. Prior chest CTs reviewed FINDINGS: CTA CHEST FINDINGS Cardiovascular: Tortuous thoracic aorta. Mild atherosclerosis. No aortic hematoma on unenhanced exam. No dissection or acute aortic findings. No evidence of acute aortic syndrome. Conventional branching pattern from the aortic arch with patent branch vessels. Excellent opacification of the pulmonary arteries, no pulmonary embolus. Mild cardiomegaly. No pericardial effusion. Mediastinum/Nodes: Scattered small mediastinal lymph nodes, not enlarged by size criteria. No hilar adenopathy. No esophageal wall thickening. Lungs/Pleura: Mild emphysema. No acute airspace disease. Mild hypoventilatory changes in the dependent lungs. No features of pulmonary edema. No pleural fluid. Trachea and central bronchi are patent. Musculoskeletal: There are no acute or suspicious osseous abnormalities. No chest wall soft tissue abnormalities. Review of the MIP images confirms the above findings. CTA ABDOMEN AND PELVIS FINDINGS VASCULAR Aorta: Moderate atherosclerosis. Noncalcified plaque in the distal aorta causes less than 25% luminal narrowing. No dissection, aneurysm or acute findings. Celiac: Patent without evidence of aneurysm, dissection, vasculitis or significant stenosis. SMA: Mild noncalcified plaque at the origin without significant  stenosis. Distal branch vessels are patent. No evidence of embolic disease. No dissection or acute findings. Renals: Single right and 2 left renal arteries. All renal  arteries are patent. No dissection or acute findings. IMA: Patent without evidence of aneurysm, dissection, vasculitis or significant stenosis. Inflow: The left common iliac artery is tortuous and atherosclerotic with a short segment dissection series 5, image 227. Moderate atheromatous plaque throughout the right internal and external iliac arteries. 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 Veins: Slight contrast refluxing into the hepatic veins and IVC. No other acute venous findings. No portal venous or mesenteric gas. Review of the MIP images confirms the above findings. NON-VASCULAR Hepatobiliary: No evidence of focal hepatic abnormality on this arterial phase exam. Liver is mildly enlarged spanning 18.7 cm cranial caudal. Partially distended gallbladder. No biliary dilatation. Pancreas: No ductal dilatation or inflammation. Spleen: Normal in size without focal abnormality. There are multiple splenules adjacent to the spleen. Adrenals/Urinary Tract: Slight adrenal thickening but no adrenal nodule. No hydronephrosis. Small renal cysts, needing no specific imaging follow-up. No evidence of solid renal lesion. Unremarkable urinary bladder. Stomach/Bowel: The stomach is nondistended. There is no bowel obstruction. No bowel inflammation or pneumatosis. No mesenteric edema. Scattered fluid within small bowel as well as ascending and transverse colon. No associated inflammatory change. Lymphatic: Prominent right external iliac node measures 14 mm short axis series 5, image 263. There is a 14 mm right upper angle node series 5, image 274. 1.5 cm low right inguinal node. There additional prominent bilateral external iliac and retroperitoneal lymph nodes. Reproductive: Prostate is unremarkable. Other: No free air. No  ascites. Small fat containing umbilical hernia. Minimal fat in the inguinal canals. Musculoskeletal: Mild lower lumbar facet hypertrophy. Review of the MIP images confirms the above findings. IMPRESSION: 1. No aortic dissection or acute aortic findings. No pulmonary embolus. 2. 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 3. Scattered fluid within small bowel as well as ascending and transverse colon, can be seen with diarrheal illness. No evidence of bowel ischemia or inflammation 4. Mild hepatomegaly. 5. Moderate external iliac and inguinal adenopathy. Additional prominent bilateral iliac and retroperitoneal lymph nodes, these nodes were also seen on remote prior exam. Findings are likely reactive, but nonspecific. Aortic Atherosclerosis (ICD10-I70.0) and Emphysema (ICD10-J43.9). Electronically Signed   By: Keith Rake M.D.   On: 01/14/2022 23:46   CT Head Wo Contrast  Result Date: 01/14/2022 CLINICAL DATA:  Altered mental status and possible sepsis EXAM: CT HEAD WITHOUT CONTRAST TECHNIQUE: Contiguous axial images were obtained from the base of the skull through the vertex without intravenous contrast. RADIATION DOSE REDUCTION: This exam was performed according to the departmental dose-optimization program which includes automated exposure control, adjustment of the mA and/or kV according to patient size and/or use of iterative reconstruction technique. COMPARISON:  01/01/2022 FINDINGS: Brain: No evidence of acute infarction, hemorrhage, hydrocephalus, extra-axial collection or mass lesion/mass effect. Mild chronic white matter ischemic changes are seen. Vascular: No hyperdense vessel or unexpected calcification. Skull: Normal. Negative for fracture or focal lesion. Sinuses/Orbits: No acute finding. Other: None. IMPRESSION: Chronic white matter ischemic changes without acute abnormality.  Electronically Signed   By: Inez Catalina M.D.   On: 01/14/2022 23:35   DG Knee 2 Views Right  Result Date: 01/14/2022 CLINICAL DATA:  Swollen right knee. EXAM: RIGHT KNEE - 1-2 VIEW COMPARISON:  None Available. FINDINGS: No evidence of fracture or dislocation. Mild medial tibiofemoral joint space narrowing. Moderate patellofemoral joint space narrowing with marginal spurring and small suprapatellar joint  effusion. Soft tissues are unremarkable. IMPRESSION: 1. No acute fracture or dislocation. 2. 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 Electronically Signed   By: Keane Police D.O.   On: 01/14/2022 22:17   DG Chest Port 1 View  Result Date: 01/14/2022 CLINICAL DATA:  Questionable sepsis - evaluate for abnormality EXAM: PORTABLE CHEST 1 VIEW COMPARISON:  Radiograph 2 days ago.  Chest CT 06/05/2018 FINDINGS: Unchanged cardiomegaly. Stable mediastinal contours. Aortic atherosclerosis. Slight vascular congestion without pulmonary edema. No confluent consolidation. No pleural effusion. No pneumothorax. IMPRESSION: Cardiomegaly with slight vascular congestion. Electronically Signed   By: Keith Rake M.D.   On: 01/14/2022 22:01        Scheduled Meds:  apixaban  5 mg Oral BID   aspirin EC  81 mg Oral Daily   atorvastatin  20 mg Oral Daily   carvedilol  25 mg Oral BID WC   diltiazem  30 mg Oral Q6H   ferrous sulfate  325 mg Oral QODAY   irbesartan  300 mg Oral Daily   vancomycin variable dose per unstable renal function (pharmacist dosing)   Does not apply See admin instructions   Continuous Infusions:  ceFEPime (MAXIPIME) IV 2 g (01/16/22 0911)     LOS: 1 day    Time spent: 55 minutes.    Dana Allan, MD  [facility] Pager #: [phone] 7PM-7AM contact night coverage as above

## 2022-01-16 NOTE — Progress Notes (Signed)
Ortho Progress Note  Patient seen this AM.  He states that his knee pain is somewhat better but still hurting a lot not able to localize it.  Fluid that was drawn off yesterday and attempt for aspiration has not grown anything.  On exam he has ballotable fluid that appears more anterior versus actually within the knee joint itself.  Due to the lack of fluid that was able to be aspirated yesterday we will recommend right today to assess the fluid collection.  Continue with broad-spectrum antibiotics.  Depending on the MRI results and his clinical response to the IV antibiotic we will determine whether I&D is necessary.  The remainder of his lower extremity appears benign.  I have noted previous aspiration results with the 46,000 on aspiration with gout crystals.  We will follow-up the MRI results.  I will make the patient n.p.o. after midnight to determine if surgical intervention is necessary tomorrow.  Shona Needles, MD [facility] [phone] (office) [email or web address]

## 2022-01-16 NOTE — Consult Note (Addendum)
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 1-2 VIEW COMPARISON:  None Available. FINDINGS: No evidence of fracture or dislocation. Mild medial tibiofemoral joint space narrowing. Moderate patellofemoral joint space narrowing with marginal spurring and small suprapatellar joint effusion. Soft tissues are unremarkable. IMPRESSION: 1. No acute fracture or dislocation. 2. 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 Electronically Signed   By: Keane Police D.O.   On: 01/14/2022 22:17   DG Chest Port 1 View  Result Date: 01/14/2022 CLINICAL DATA:  Questionable sepsis - evaluate for abnormality EXAM: PORTABLE CHEST 1 VIEW COMPARISON:  Radiograph 2 days ago.  Chest CT 06/05/2018 FINDINGS: Unchanged cardiomegaly. Stable mediastinal contours. Aortic atherosclerosis. Slight  vascular congestion without pulmonary edema. No confluent consolidation. No pleural effusion. No pneumothorax. IMPRESSION: Cardiomegaly with slight vascular congestion. Electronically Signed   By: Keith Rake M.D.   On: 01/14/2022 22:01   VAS [place] LOWER EXTREMITY VENOUS (DVT)  Result Date: 01/13/2022  Lower Venous DVT Study Patient Name:  [name]  Date of Exam:   01/13/2022 Medical Rec #: [id number]   Accession #:    [id number] Date of Birth: 1960-08-17   Patient Gender: M Patient Age:   45 years Exam Location:  [facility] [place] Procedure:      VAS [place] LOWER EXTREMITY VENOUS (DVT) Referring Phys: Sharyn Lull --------------------------------------------------------------------------------  Indications: Swelling, skin changes, pain to right leg with blistering of distal calf.  Comparison Study: Extensive arterial history with monophasic runoff and                   calcification bilaterally on most recent examination                   11-25-2021.                    06-18-2021 Prior right lower extremity venous was negative for                   DVT. Performing Technologist: Darlin Coco RDMS, RVT  Examination Guidelines: A complete evaluation includes B-mode imaging, spectral Doppler, color Doppler, and power Doppler as needed of all accessible portions of each vessel. Bilateral testing is considered an integral part of a complete examination. Limited examinations for reoccurring indications may be performed as noted. The reflux portion of the exam is performed with the patient in reverse Trendelenburg.  +---------+---------------+---------+-----------+----------+--------------+ RIGHT    CompressibilityPhasicitySpontaneityPropertiesThrombus Aging +---------+---------------+---------+-----------+----------+--------------+ CFV      Full           Yes      Yes                                 +---------+---------------+---------+-----------+----------+--------------+ SFJ  Full                                                         +---------+---------------+---------+-----------+----------+--------------+ FV Prox  Full                                                        +---------+---------------+---------+-----------+----------+--------------+ FV Mid   Full                                                        +---------+---------------+---------+-----------+----------+--------------+ FV DistalFull                                                        +---------+---------------+---------+-----------+----------+--------------+ PFV      Full                                                        +---------+---------------+---------+-----------+----------+--------------+ POP      Full           Yes      Yes                                 +---------+---------------+---------+-----------+----------+--------------+ PTV      Full                                                        +---------+---------------+---------+-----------+----------+--------------+ PERO     Full                                                        +---------+---------------+---------+-----------+----------+--------------+ Gastroc  Full                                                        +---------+---------------+---------+-----------+----------+--------------+   +----+---------------+---------+-----------+----------+--------------+ LEFTCompressibilityPhasicitySpontaneityPropertiesThrombus Aging +----+---------------+---------+-----------+----------+--------------+ CFV Full           Yes      Yes                                 +----+---------------+---------+-----------+----------+--------------+  Summary: RIGHT: - There is no evidence of deep vein thrombosis in the lower extremity. Appearance of fluid collection in the joint space on the medial aspect of the knee.  LEFT: - No evidence of common femoral vein obstruction.  *See table(s) above  for measurements and observations. Electronically signed by Orlie Pollen on 01/13/2022 at 8:08:11 PM.    Final    DG Chest 2 View  Result Date: 01/12/2022 CLINICAL DATA:  Shortness of breath EXAM: CHEST - 2 VIEW COMPARISON:  01/01/2022 FINDINGS: Lungs are clear.  No pleural effusion or pneumothorax. Cardiomegaly. Mild degenerative changes of the visualized thoracolumbar spine. IMPRESSION: No evidence of acute cardiopulmonary disease. Cardiomegaly. Electronically Signed   By: Julian Hy M.D.   On: 01/12/2022 21:46     Assessment and Plan:   Atrial fib in pt with persistent atrial fib and biatrial enlargement on echo, for now rate control and plan for Bi atrial Maze procedure when able to have AVR, CABG   on Eliquis. CHA2DS2VASc of 2. IV dilt has been changed from po  back on coreg 25 BID  Severe AS with plan to proceed with AVR and CABG X 1. But not until infection cleared. Will need follow up with [name] once improved. Chronic diastolic CHF stable currently  was on lasix 40 mg TID, on hold.  Monitor closely, strict I&O weigh daily.   Resistant HTN has been on coreg 25 BID, clonidine 0.2 mg TID, hydralazine 100 mg TID, olmesartan 40 mg daily. Spironolactone 50 mg daily all on hold - resume as BP allows.  He is on coreg 25 BID  BP 151/109 Sepsis per IM and ortho  MRI just done. On ABX HLD resume statin once able to take OSA on CPAP and 02 AKI pk Cr 2.40 now down to 1.62   Risk Assessment/Risk Scores:          CHA2DS2-VASc Score = 2   This indicates a 2.2% annual risk of stroke. The patient's score is based upon: CHF History: 1 HTN History: 1 Diabetes History: 0 Stroke History: 0 Vascular Disease History: 0 Age Score: 0 Gender Score: 0         For questions or updates, please contact [place] Please consult [email or web address] for contact info under    Signed, Cecilie Kicks, NP  01/16/2022 1:01 PM  Patient seen and examined with Cecilie Kicks, NP.  Agree  as above, with the following exceptions and changes as noted below.  Patient is a 61 year old male who is a patient of [name], followed for resistant hypertension and severe aortic valve stenosis.  Currently admitted with sepsis due to right knee septic arthritis.  Recently admitted from 9/30 - 10/5 with group A strep bacteremia and sepsis syndrome due to leg cellulitis.  Seen in the ED on 1012 for right lower extremity swelling, DVT negative, blood pressure elevated.  Seen again on 1013 for generalized weakness, hypotension, fever, altered mental status responsive to IV fluids and felt to represent severe sepsis.  He had been continued on amoxicillin after his group A strep bacteremia and was compliant with therapy per family report.  He was also noted to have atrial fibrillation with rapid ventricular response on presentation responsive to IV diltiazem, now converted to oral diltiazem 30 mg every 6 maintaining adequate rate control.  He also continues on apixaban 5 mg twice daily though orthopedic procedures are tentatively planned, and he will likely need to transition to IV heparin.  He has been  evaluated by Dr. Arvid Right, and aortic valve replacement with CABG and maze has been planned, previously in April, then postponed with more recent evaluation immediately prior to this hospital presentation.  I discussed with the patient's wife in detail that surgery will need to be deferred until concerns of infection are completely resolved.  We also discussed the hemodynamic impact of severe aortic valve stenosis in the setting of severe sepsis.  Gen: NAD, CV: Irregular rhythm normal rate, 3/6 mid-to-late peaking SEM, obscures S2, Lungs: clear, Abd: soft, Extrem: Warm, no edema neuro/Psych: alert and oriented x 3, normal mood and affect. All available labs, radiology testing, previous records reviewed.   Recommendations: - Patient presented hypotensive now is hypertensive with a known history of  resistant hypertension.  With severe AS, avoid hypotension.  Primary service has resumed irbesartan and carvedilol 25 mg twice daily, patient is also on oral diltiazem for rate control.  Blood pressure on my exam was 0000000 to [id number] systolic over 0000000 to [id number] diastolic.  Reasonable to slowly resume antihypertensive therapy. -Rate is currently well controlled in atrial fibrillation, can continue carvedilol and oral diltiazem every 6.  Patient is on Eliquis, may need IV heparin transition for procedures. -Chronic diastolic heart failure-with hypotension this was held, appears euvolemic on exam.  Agree with strict I's and O's. -Antibiotic selection and duration per primary service and orthopedic service.  If patient remains stable as he is today, no cardiovascular procedures required at this time.  At primary service request, cardiology will follow along.  Elouise Munroe, MD 01/16/22 3:01 PM

## 2022-01-17 ENCOUNTER — Other Ambulatory Visit: Payer: Self-pay

## 2022-01-17 ENCOUNTER — Telehealth: Payer: Self-pay | Admitting: Critical Care Medicine

## 2022-01-17 ENCOUNTER — Inpatient Hospital Stay (HOSPITAL_COMMUNITY): Payer: Medicare Other | Admitting: Anesthesiology

## 2022-01-17 ENCOUNTER — Encounter (HOSPITAL_COMMUNITY): Payer: Self-pay | Admitting: Internal Medicine

## 2022-01-17 ENCOUNTER — Encounter (HOSPITAL_COMMUNITY)
Admission: EM | Disposition: A | Discharge status: Home / Self Care | Payer: Self-pay | Source: Home / Self Care | Attending: Family Medicine

## 2022-01-17 DIAGNOSIS — N189 Chronic kidney disease, unspecified: Secondary | ICD-10-CM | POA: Diagnosis not present

## 2022-01-17 DIAGNOSIS — D631 Anemia in chronic kidney disease: Secondary | ICD-10-CM

## 2022-01-17 DIAGNOSIS — M009 Pyogenic arthritis, unspecified: Secondary | ICD-10-CM

## 2022-01-17 DIAGNOSIS — I130 Hypertensive heart and chronic kidney disease with heart failure and stage 1 through stage 4 chronic kidney disease, or unspecified chronic kidney disease: Secondary | ICD-10-CM

## 2022-01-17 DIAGNOSIS — I509 Heart failure, unspecified: Secondary | ICD-10-CM

## 2022-01-17 DIAGNOSIS — A419 Sepsis, unspecified organism: Secondary | ICD-10-CM | POA: Diagnosis not present

## 2022-01-17 DIAGNOSIS — R6521 Severe sepsis with septic shock: Secondary | ICD-10-CM | POA: Diagnosis not present

## 2022-01-17 HISTORY — PX: IRRIGATION AND DEBRIDEMENT KNEE: SHX5185

## 2022-01-17 LAB — PROCALCITONIN: Procalcitonin: 2.4 ng/mL

## 2022-01-17 LAB — BRAIN NATRIURETIC PEPTIDE: B Natriuretic Peptide: 546.3 pg/mL — ABNORMAL HIGH (ref 0.0–100.0)

## 2022-01-17 LAB — COMPREHENSIVE METABOLIC PANEL
ALT: 12 U/L (ref 0–44)
AST: 11 U/L — ABNORMAL LOW (ref 15–41)
Albumin: 2.3 g/dL — ABNORMAL LOW (ref 3.5–5.0)
Alkaline Phosphatase: 57 U/L (ref 38–126)
Anion gap: 7 (ref 5–15)
BUN: 17 mg/dL (ref 8–23)
CO2: 21 mmol/L — ABNORMAL LOW (ref 22–32)
Calcium: 9 mg/dL (ref 8.9–10.3)
Chloride: 107 mmol/L (ref 98–111)
Creatinine, Ser: 1.48 mg/dL — ABNORMAL HIGH (ref 0.61–1.24)
GFR, Estimated: 53 mL/min — ABNORMAL LOW (ref >60)
Glucose, Bld: 98 mg/dL (ref 70–99)
Potassium: 4 mmol/L (ref 3.5–5.1)
Sodium: 135 mmol/L (ref 135–145)
Total Bilirubin: 0.8 mg/dL (ref 0.3–1.2)
Total Protein: 6.7 g/dL (ref 6.5–8.1)

## 2022-01-17 LAB — CBC WITH DIFFERENTIAL/PLATELET
Abs Immature Granulocytes: 0.02 10*3/uL (ref 0.00–0.07)
Basophils Absolute: 0 10*3/uL (ref 0.0–0.1)
Basophils Relative: 1 %
Eosinophils Absolute: 0.1 10*3/uL (ref 0.0–0.5)
Eosinophils Relative: 2 %
HCT: 25.9 % — ABNORMAL LOW (ref 39.0–52.0)
Hemoglobin: 8.6 g/dL — ABNORMAL LOW (ref 13.0–17.0)
Immature Granulocytes: 0 %
Lymphocytes Relative: 15 %
Lymphs Abs: 1 10*3/uL (ref 0.7–4.0)
MCH: 23.5 pg — ABNORMAL LOW (ref 26.0–34.0)
MCHC: 33.2 g/dL (ref 30.0–36.0)
MCV: 70.8 fL — ABNORMAL LOW (ref 80.0–100.0)
Monocytes Absolute: 0.8 10*3/uL (ref 0.1–1.0)
Monocytes Relative: 13 %
Neutro Abs: 4.6 10*3/uL (ref 1.7–7.7)
Neutrophils Relative %: 69 %
Platelets: 179 10*3/uL (ref 150–400)
RBC: 3.66 MIL/uL — ABNORMAL LOW (ref 4.22–5.81)
RDW: 15.4 % (ref 11.5–15.5)
WBC: 6.6 10*3/uL (ref 4.0–10.5)
nRBC: 0 % (ref 0.0–0.2)

## 2022-01-17 LAB — C-REACTIVE PROTEIN: CRP: 17.6 mg/dL — ABNORMAL HIGH (ref <1.0)

## 2022-01-17 LAB — MAGNESIUM: Magnesium: 1.8 mg/dL (ref 1.7–2.4)

## 2022-01-17 SURGERY — IRRIGATION AND DEBRIDEMENT KNEE
Anesthesia: General | Site: Knee | Laterality: Right

## 2022-01-17 MED ORDER — FENTANYL CITRATE (PF) 250 MCG/5ML IJ SOLN
INTRAMUSCULAR | Status: AC
  Filled 2022-01-17: qty 5

## 2022-01-17 MED ORDER — ACETAMINOPHEN 10 MG/ML IV SOLN: 1000.0000 mg | Freq: Once | INTRAVENOUS | Status: DC | PRN

## 2022-01-17 MED ORDER — FENTANYL CITRATE PF 50 MCG/ML IJ SOSY
25.0000 ug | PREFILLED_SYRINGE | INTRAMUSCULAR | Status: DC | PRN
  Administered 2022-01-18: 25 ug via INTRAVENOUS
  Filled 2022-01-17: qty 1

## 2022-01-17 MED ORDER — LIDOCAINE 2% (20 MG/ML) 5 ML SYRINGE
INTRAMUSCULAR | Status: DC | PRN
  Administered 2022-01-17: 80 mg via INTRAVENOUS

## 2022-01-17 MED ORDER — 0.9 % SODIUM CHLORIDE (POUR BTL) OPTIME
TOPICAL | Status: DC | PRN
  Administered 2022-01-17: 1000 mL

## 2022-01-17 MED ORDER — METOPROLOL TARTRATE 5 MG/5ML IV SOLN
INTRAVENOUS | Status: AC
  Filled 2022-01-17: qty 5

## 2022-01-17 MED ORDER — FENTANYL CITRATE (PF) 250 MCG/5ML IJ SOLN
INTRAMUSCULAR | Status: DC | PRN
  Administered 2022-01-17 (×6): 25 ug via INTRAVENOUS

## 2022-01-17 MED ORDER — METOPROLOL TARTRATE 5 MG/5ML IV SOLN
INTRAVENOUS | Status: DC | PRN
  Administered 2022-01-17: 1 mg via INTRAVENOUS

## 2022-01-17 MED ORDER — HYDROXYZINE HCL 25 MG PO TABS
50.0000 mg | ORAL_TABLET | Freq: Three times a day (TID) | ORAL | Status: DC | PRN
  Administered 2022-01-17 – 2022-01-18 (×2): 50 mg via ORAL
  Filled 2022-01-17 (×2): qty 2

## 2022-01-17 MED ORDER — ONDANSETRON HCL 4 MG/2ML IJ SOLN
INTRAMUSCULAR | Status: AC
  Filled 2022-01-17: qty 2

## 2022-01-17 MED ORDER — FENTANYL CITRATE (PF) 100 MCG/2ML IJ SOLN: 25.0000 ug | INTRAMUSCULAR | Status: DC | PRN

## 2022-01-17 MED ORDER — PROPOFOL 10 MG/ML IV BOLUS
INTRAVENOUS | Status: DC | PRN
  Administered 2022-01-17: 40 mg via INTRAVENOUS
  Administered 2022-01-17: 150 mg via INTRAVENOUS

## 2022-01-17 MED ORDER — CHLORHEXIDINE GLUCONATE 0.12 % MT SOLN: 15.0000 mL | Freq: Once | OROMUCOSAL | Status: AC

## 2022-01-17 MED ORDER — MIDAZOLAM HCL 2 MG/2ML IJ SOLN
INTRAMUSCULAR | Status: DC | PRN
  Administered 2022-01-17: 1 mg via INTRAVENOUS

## 2022-01-17 MED ORDER — PROPOFOL 10 MG/ML IV BOLUS
INTRAVENOUS | Status: AC
  Filled 2022-01-17: qty 20

## 2022-01-17 MED ORDER — PHENYLEPHRINE 80 MCG/ML (10ML) SYRINGE FOR IV PUSH (FOR BLOOD PRESSURE SUPPORT)
PREFILLED_SYRINGE | INTRAVENOUS | Status: AC
  Filled 2022-01-17: qty 10

## 2022-01-17 MED ORDER — DEXAMETHASONE SODIUM PHOSPHATE 10 MG/ML IJ SOLN
INTRAMUSCULAR | Status: DC | PRN
  Administered 2022-01-17: 4 mg via INTRAVENOUS

## 2022-01-17 MED ORDER — ORAL CARE MOUTH RINSE: 15.0000 mL | Freq: Once | OROMUCOSAL | Status: AC

## 2022-01-17 MED ORDER — ONDANSETRON HCL 4 MG/2ML IJ SOLN
INTRAMUSCULAR | Status: DC | PRN
  Administered 2022-01-17: 4 mg via INTRAVENOUS

## 2022-01-17 MED ORDER — LACTATED RINGERS IV SOLN: INTRAVENOUS | Status: DC

## 2022-01-17 MED ORDER — PHENYLEPHRINE 80 MCG/ML (10ML) SYRINGE FOR IV PUSH (FOR BLOOD PRESSURE SUPPORT)
PREFILLED_SYRINGE | INTRAVENOUS | Status: DC | PRN
  Administered 2022-01-17: 160 ug via INTRAVENOUS
  Administered 2022-01-17: 80 ug via INTRAVENOUS
  Administered 2022-01-17 (×2): 160 ug via INTRAVENOUS
  Administered 2022-01-17 (×3): 80 ug via INTRAVENOUS

## 2022-01-17 MED ORDER — DIPHENHYDRAMINE HCL 25 MG PO CAPS
25.0000 mg | ORAL_CAPSULE | ORAL | Status: DC | PRN
  Administered 2022-01-17: 25 mg via ORAL
  Filled 2022-01-17: qty 1

## 2022-01-17 MED ORDER — HYDROMORPHONE HCL 1 MG/ML IJ SOLN
0.5000 mg | INTRAMUSCULAR | Status: DC | PRN
  Administered 2022-01-17: 0.5 mg via INTRAVENOUS
  Filled 2022-01-17: qty 1

## 2022-01-17 MED ORDER — LIDOCAINE 2% (20 MG/ML) 5 ML SYRINGE
INTRAMUSCULAR | Status: AC
  Filled 2022-01-17: qty 5

## 2022-01-17 MED ORDER — MIDAZOLAM HCL 2 MG/2ML IJ SOLN
INTRAMUSCULAR | Status: AC
  Filled 2022-01-17: qty 2

## 2022-01-17 MED ORDER — DEXAMETHASONE SODIUM PHOSPHATE 10 MG/ML IJ SOLN
INTRAMUSCULAR | Status: AC
  Filled 2022-01-17: qty 1

## 2022-01-17 MED ORDER — ACETAMINOPHEN 10 MG/ML IV SOLN
INTRAVENOUS | Status: AC
  Filled 2022-01-17: qty 100

## 2022-01-17 MED ORDER — CHLORHEXIDINE GLUCONATE 0.12 % MT SOLN
OROMUCOSAL | Status: AC
  Administered 2022-01-17: 15 mL via OROMUCOSAL
  Filled 2022-01-17: qty 15

## 2022-01-17 MED ORDER — ESMOLOL HCL 100 MG/10ML IV SOLN
INTRAVENOUS | Status: AC
  Filled 2022-01-17: qty 10

## 2022-01-17 MED ORDER — APIXABAN 5 MG PO TABS
5.0000 mg | ORAL_TABLET | Freq: Two times a day (BID) | ORAL | Status: DC
  Administered 2022-01-17 – 2022-01-19 (×4): 5 mg via ORAL
  Filled 2022-01-17 (×4): qty 1

## 2022-01-17 MED ORDER — ACETAMINOPHEN 10 MG/ML IV SOLN
INTRAVENOUS | Status: DC | PRN
  Administered 2022-01-17: 1000 mg via INTRAVENOUS

## 2022-01-17 SURGICAL SUPPLY — 43 items
BAG COUNTER SPONGE SURGICOUNT (BAG) IMPLANT
BNDG ELASTIC 4X5.8 VLCR STR LF (GAUZE/BANDAGES/DRESSINGS) IMPLANT
BNDG ELASTIC 6X10 VLCR STRL LF (GAUZE/BANDAGES/DRESSINGS) IMPLANT
BNDG GAUZE DERMACEA FLUFF 4 (GAUZE/BANDAGES/DRESSINGS) ×2 IMPLANT
BRUSH SCRUB EZ PLAIN DRY (MISCELLANEOUS) ×2 IMPLANT
COVER SURGICAL LIGHT HANDLE (MISCELLANEOUS) ×2 IMPLANT
DRAPE U-SHAPE 47X51 STRL (DRAPES) ×1 IMPLANT
DRSG MEPITEL 4X7.2 (GAUZE/BANDAGES/DRESSINGS) IMPLANT
ELECT REM PT RETURN 9FT ADLT (ELECTROSURGICAL)
ELECTRODE REM PT RTRN 9FT ADLT (ELECTROSURGICAL) IMPLANT
EVACUATOR 3/16  PVC DRAIN (DRAIN) ×1
EVACUATOR 3/16 PVC DRAIN (DRAIN) IMPLANT
GAUZE SPONGE 4X4 12PLY STRL (GAUZE/BANDAGES/DRESSINGS) ×1 IMPLANT
GLOVE BIOGEL PI IND STRL 7.5 (GLOVE) ×1 IMPLANT
GLOVE BIOGEL PI IND STRL 8 (GLOVE) ×1 IMPLANT
GLOVE BIOGEL PI IND STRL 9 (GLOVE) ×1 IMPLANT
GOWN STRL REUS W/ TWL LRG LVL3 (GOWN DISPOSABLE) ×2 IMPLANT
GOWN STRL REUS W/ TWL XL LVL3 (GOWN DISPOSABLE) ×1 IMPLANT
GOWN STRL REUS W/TWL LRG LVL3 (GOWN DISPOSABLE) ×2
GOWN STRL REUS W/TWL XL LVL3 (GOWN DISPOSABLE) ×1
GRAFT SKIN WND MICRO 38 (Tissue) IMPLANT
KIT BASIN OR (CUSTOM PROCEDURE TRAY) ×1 IMPLANT
KIT TURNOVER KIT B (KITS) ×1 IMPLANT
MANIFOLD NEPTUNE II (INSTRUMENTS) ×1 IMPLANT
NS IRRIG 1000ML POUR BTL (IV SOLUTION) ×1 IMPLANT
PACK ORTHO EXTREMITY (CUSTOM PROCEDURE TRAY) ×1 IMPLANT
PAD ARMBOARD 7.5X6 YLW CONV (MISCELLANEOUS) ×2 IMPLANT
SOL PREP POV-IOD 4OZ 10% (MISCELLANEOUS) ×1 IMPLANT
SOL SCRUB PVP POV-IOD 4OZ 7.5% (MISCELLANEOUS) ×1
SOLUTION SCRB POV-IOD 4OZ 7.5% (MISCELLANEOUS) ×1 IMPLANT
SPONGE T-LAP 18X18 ~~LOC~~+RFID (SPONGE) ×1 IMPLANT
STOCKINETTE IMPERVIOUS 9X36 MD (GAUZE/BANDAGES/DRESSINGS) IMPLANT
SUT ETHILON 2 0 FS 18 (SUTURE) IMPLANT
SUT ETHILON 2 0 PSLX (SUTURE) IMPLANT
SUT PDS AB 1 CT1 36 (SUTURE) IMPLANT
SUT PDS AB 2-0 CT1 27 (SUTURE) IMPLANT
SWAB CULTURE ESWAB REG 1ML (MISCELLANEOUS) IMPLANT
TOWEL GREEN STERILE (TOWEL DISPOSABLE) ×2 IMPLANT
TOWEL GREEN STERILE FF (TOWEL DISPOSABLE) ×1 IMPLANT
TUBE CONNECTING 12X1/4 (SUCTIONS) ×1 IMPLANT
UNDERPAD 30X36 HEAVY ABSORB (UNDERPADS AND DIAPERS) ×1 IMPLANT
WATER STERILE IRR 1000ML POUR (IV SOLUTION) ×1 IMPLANT
YANKAUER SUCT BULB TIP NO VENT (SUCTIONS) ×1 IMPLANT

## 2022-01-17 NOTE — Progress Notes (Signed)
[facility] (OTS)  Day of Surgery Procedure(s) (LRB): IRRIGATION AND DEBRIDEMENT KNEE (Right) planned for today.  Subjective: Patient reports pain as moderate and severe.    Objective: Current Vitals Blood pressure (!) 158/99, pulse 89, temperature 99 F (37.2 C), temperature source Oral, resp. rate 18, height 5\' 9"  (1.753 m), weight 94.3 kg, SpO2 95 %. Vital signs in last 24 hours: Temp:  [98.8 F (37.1 C)-100 F (37.8 C)] 99 F (37.2 C) (10/16 0730) Pulse Rate:  [72-101] 89 (10/16 0730) Resp:  [16-20] 18 (10/16 0730) BP: (143-158)/(86-109) 158/99 (10/16 0730) SpO2:  [93 %-99 %] 95 % (10/16 0730)  Intake/Output from previous day: 10/15 0701 - 10/16 0700 In: 685 [P.O.:240; I.V.:44.9; IV Piggyback:400.1] Out: 550 [Urine:550]  LABS Recent Labs    01/14/22 2219 01/14/22 2245 01/15/22 0450 01/16/22 0253 01/17/22 0156  HGB 11.6* 10.9* 9.9* 9.2* 8.6*   Recent Labs    01/16/22 0253 01/17/22 0156  WBC 8.1 6.6  RBC 3.77* 3.66*  HCT 26.4* 25.9*  PLT 201 179   Recent Labs    01/16/22 0253 01/17/22 0156  NA 137 135  K 3.8 4.0  CL 108 107  CO2 20* 21*  BUN 22 17  CREATININE 1.62* 1.48*  GLUCOSE 96 98  CALCIUM 8.9 9.0   Recent Labs    01/14/22 2128 01/16/22 0947  INR 2.2* 1.9*     Physical Exam  RLE Ankle Dressing soiled intact, mild drainage   Exquisite pain with attempted motion, 3+ effusion, hot Edema/ swelling controlled  Sens: DPN, SPN, TN intact  Motor: EHL, FHL, and lessor toe ext and flex all intact grossly  Brisk cap refill, warm to touch  Assessment/Plan: Plan I &D of right knee for suspected infection.  The risks and benefits of surgery were discussed with the patient, including the possibility of persistent infection, nerve injury, vessel injury, wound breakdown, arthritis, DVT/ PE, loss of motion, malunion, nonunion, and need for further surgery among others.  We also specifically discussed the potential for recurrence  and anaesthetic risks.  These risks were acknowledged and consent provided to proceed.   [place] , MD [facility], [facility] [phone]

## 2022-01-17 NOTE — Progress Notes (Signed)
[facility] PROGRESS NOTE  Hashem Goynes  IRJ:[id number] DOB: 22-Aug-1960 DOA: 01/14/2022 PCP: [name], MD  Brief Narrative: [name] is a 61 y.o. male with a history of severe AS, HFpEF, AFib on eliquis, stage IIIa CKD, HTN, HLD, OSA not on CPAP who presented for the 2nd time in 2 days on 10/13 with increasing right knee pain. He had been admitted 9/30 with group A streptococcal bacteremia, ARF and RLE cellulitis as well as suspected gout of the right knee treated with steroid injection. With negative echo, penicillin and linezolid were given with improvement, transitioned to amoxicillin for 14 days per ID and discharged 10/5. He returned ultimately 10/13 with worsening pain and lethargy. He was septic with hypotension responsive to resuscitation, leukocytosis (WBC 14), AKI (SCr 2.4 from 1.5). IV antibiotics given, cultures drawn, and ultimately MR right knee revealed mod-large complex effusion, synovial thickening, lateral femoral condyle focal erosion, minimal adjacent marrow edema, extensive muscular edema, quadriceps tendinosis with partial-thickness tear. Orthopedics took for I&D 10/16.   Subjective: Seen just after surgery. Dilaudid helped pain but caused itching despite benadryl. No fever, feels better than when he got here. No dyspnea or chest pain.  Objective: BP (!) 150/96 (BP Location: Right Arm)   Pulse 85   Temp 98.1 F (36.7 C) (Oral)   Resp (!) 22   Ht 5\' 9"  (1.753 m)   Wt 94.3 kg   SpO2 97%   BMI 30.70 kg/m   Gen: No distress Pulm: Clear and nonlabored   CV: Irreg irreg, significant holosystolic murmur at cardiac base, no JVD, no edema GI: Soft, NT, ND, +BS  Neuro: Alert and oriented. No focal deficits. Ext: RLE almost entirely dressed with clean ACE wrap. Foot is NVI. Diminished pulse on left DP. Skin: No other lesions on visualized skin.  Assessment & Plan: Severe sepsis with AKI: Don't believe septic shock is correct since hypotension was responsive to  IV fluids.  - Continue vancomycin, cefepime - Monitor blood cultures - Pt to continue coverage inclusive of group A strep per recent bacteremia.   Right knee septic arthritis with adjoining myositis:  - s/p I&D per orthopedics 10/16.  - Adjustments to pain control and pruritus prn's per orders. Cahnge to fentanyl IV if absolutely needed, otherwise, hydrocodone po.   AKI on stage IIIa CKD:   Persistent AFib with RVR: Rate improved.  - Continue coreg, cardizem - Held eliquis preop, ok to restart anticoagulation this evening per ortho with eliquis.   HTN, chronic HFpEF, aortic stenosis: - Restarted coreg, ARB.  - BNP down from prior, required IVF resuscitation, will avoid hypotension and monitor I/O, volume status closely  - Follow up with [name] after all infection has been treated to pursue AVR. Also speaking of MAZE.   HLD:  - Resume statin  OSA:  - Nocturnal O2, pt not on CPAP  Patrecia Pour, MD [facility] [email or web address] 01/17/2022, 6:38 PM

## 2022-01-17 NOTE — Telephone Encounter (Signed)
Copied from [place] [phone]. Topic: Appointment Scheduling - Scheduling Inquiry for Clinic >> Jan 14, 2022  2:01 PM Chapman Fitch wrote: Reason for CRM: Pt was seen in the ER again / and the hospital would like Dr. Joya Gaskins to contact the pt for f/u asap / please advise

## 2022-01-17 NOTE — Progress Notes (Signed)
PT Cancellation Note  Patient Details Name: [name] MRN: [id number] DOB: 18-Mar-1961   Cancelled Treatment:    Reason Eval/Treat Not Completed: Patient at procedure or test/unavailable.  Gone for surgery R knee and will follow up at another time.   Ramond Dial 01/17/2022, 10:46 AM  Mee Hives, PT PhD Acute Rehab Dept. Number: [place] and [place]

## 2022-01-17 NOTE — Progress Notes (Signed)
OT Cancellation Note  Patient Details Name: [name] MRN: [id number] DOB: 05-12-1960   Cancelled Treatment:    Reason Eval/Treat Not Completed: Patient at procedure or test/ unavailable (pt off unit for R knee I & D , will follow up for OT evaluation as schedule permits.)  Lynnda Child, OTD, OTR/L Acute Rehab [phone] - [place] 01/17/2022, 9:01 AM

## 2022-01-17 NOTE — Anesthesia Procedure Notes (Signed)
Procedure Name: LMA Insertion Date/Time: 01/17/2022 8:51 AM  Performed by: Janene Harvey, CRNAPre-anesthesia Checklist: Patient identified, Emergency Drugs available, Suction available and Patient being monitored Patient Re-evaluated:Patient Re-evaluated prior to induction Oxygen Delivery Method: Circle system utilized Preoxygenation: Pre-oxygenation with 100% oxygen Induction Type: IV induction LMA: LMA inserted LMA Size: 4.0 Placement Confirmation: positive ETCO2 Dental Injury: Teeth and Oropharynx as per pre-operative assessment

## 2022-01-17 NOTE — Op Note (Signed)
#  [id number] 

## 2022-01-17 NOTE — Anesthesia Preprocedure Evaluation (Signed)
Anesthesia Evaluation  Patient identified by MRN, date of birth, ID band Patient awake    Reviewed: Allergy & Precautions, NPO status , Patient's Chart, lab work & pertinent test results  Airway Mallampati: II  TM Distance: >3 FB Neck ROM: Full    Dental no notable dental hx.    Pulmonary sleep apnea ,    Pulmonary exam normal        Cardiovascular hypertension, + angina + Peripheral Vascular Disease and +CHF  + dysrhythmias (on Eliquis) Atrial Fibrillation  Rhythm:Regular Rate:Normal  ECHO 09/23: 1. Left ventricular ejection fraction, by estimation, is 60 to 65%. The  left ventricle has normal function. The left ventricle has no regional  wall motion abnormalities. There is severe concentric left ventricular  hypertrophy. Left ventricular diastolic  function could not be evaluated. Elevated left atrial pressure.  2. Right ventricular systolic function is normal. The right ventricular  size is normal. There is mildly elevated pulmonary artery systolic  pressure.  3. Left atrial size was severely dilated.  4. Right atrial size was mildly dilated.  5. The mitral valve is normal in structure. Mild mitral valve  regurgitation. No evidence of mitral stenosis.  6. The aortic valve is tricuspid. Aortic valve regurgitation is mild.  Moderate to severe aortic valve stenosis.  7. Aortic dilatation noted. There is mild dilatation of the ascending  aorta, measuring 40 mm.  8. The inferior vena cava is normal in size with greater than 50%  respiratory variability, suggesting right atrial pressure of 3 mmHg.    Neuro/Psych  Headaches, negative psych ROS   GI/Hepatic GERD  ,  Endo/Other  negative endocrine ROS  Renal/GU CRFRenal disease  negative genitourinary   Musculoskeletal  (+) Arthritis , Osteoarthritis,  Septic arthritis    Abdominal Normal abdominal exam  (+)   Peds  Hematology  (+) Blood dyscrasia, anemia ,    Anesthesia Other Findings   Reproductive/Obstetrics (+) Breast feeding                            Anesthesia Physical Anesthesia Plan  ASA: 3  Anesthesia Plan: General   Post-op Pain Management:    Induction: Intravenous  PONV Risk Score and Plan: 2 and Ondansetron, Dexamethasone, Treatment may vary due to age or medical condition and Midazolam  Airway Management Planned: Mask and Oral ETT  Additional Equipment: None  Intra-op Plan:   Post-operative Plan: Extubation in OR  Informed Consent: I have reviewed the patients History and Physical, chart, labs and discussed the procedure including the risks, benefits and alternatives for the proposed anesthesia with the patient or authorized representative who has indicated his/her understanding and acceptance.     Dental advisory given  Plan Discussed with: CRNA  Anesthesia Plan Comments: (Lab Results      Component                Value               Date                      WBC                      6.6                 01/17/2022                HGB  8.6 (L)             01/17/2022                HCT                      25.9 (L)            01/17/2022                MCV                      70.8 (L)            01/17/2022                PLT                      179                 01/17/2022           Lab Results      Component                Value               Date                      NA                       135                 01/17/2022                K                        4.0                 01/17/2022                CO2                      21 (L)              01/17/2022                GLUCOSE                  98                  01/17/2022                BUN                      17                  01/17/2022                CREATININE               1.48 (H)            01/17/2022                CALCIUM                  9.0                 01/17/2022  EGFR                      43 (L)              12/07/2021                GFRNONAA                 53 (L)              01/17/2022          )       Anesthesia Quick Evaluation

## 2022-01-17 NOTE — Evaluation (Signed)
Occupational Therapy Evaluation Patient Details Name: [name] MRN: [id number] DOB: May 23, 1960 Today's Date: 01/17/2022   History of Present Illness Pt is a 61 y.o. male who presented 01/14/22 with progressive pain in R knee and weakness. S/p R knee aspiration 10/14. Patient recently hospitalized 01/01/2022 through 01/06/2022 with diagnosis of sepsis due to group A streptococcus with acute renal failure in the setting of lower extremity cellulitis and swollen knee.  At the time septic arthritis was ruled out. S/p I & D to R knee on 10/16. PMH: HTN, CHF, HLD, gout, CKD, afib, PAD, aortic stenosis, cellulitis, chronic back pain.   Clinical Impression   Pt reports independence at baseline with ADLs and functional mobility, lives with family who can assist at d/c. Pt currently with 9/10 pain in R knee, needing min A and significantly increased time to sit up at EOB. Pt able to lateral scoot with min guard A toward HOB, declined standing due to pain. Pt presenting with impairments listed below, will follow acutely. Recommend HHOT at d/c.      Recommendations for follow up therapy are one component of a multi-disciplinary discharge planning process, led by the attending physician.  Recommendations may be updated based on patient status, additional functional criteria and insurance authorization.   Follow Up Recommendations  Home health OT    Assistance Recommended at Discharge Frequent or constant Supervision/Assistance  Patient can return home with the following A lot of help with bathing/dressing/bathroom;Assistance with cooking/housework;Assist for transportation;Help with stairs or ramp for entrance;A lot of help with walking and/or transfers    Functional Status Assessment  Patient has had a recent decline in their functional status and demonstrates the ability to make significant improvements in function in a reasonable and predictable amount of time.  Equipment Recommendations  BSC/3in1     Recommendations for Other Services PT consult     Precautions / Restrictions Precautions Precautions: Fall Precaution Comments: R leg hypersensitive Restrictions Weight Bearing Restrictions: No Other Position/Activity Restrictions: WBAT and ROM as tolerated to R leg      Mobility Bed Mobility Overal bed mobility: Needs Assistance Bed Mobility: Supine to Sit, Sit to Supine Rolling: Min assist   Supine to sit: Min assist Sit to supine: Min assist   General bed mobility comments: significantly increased time due to pain    Transfers                   General transfer comment: pt declining due to pain, able to lateral scoot toward [facility] with min guard A      Balance Overall balance assessment: Needs assistance Sitting-balance support: No upper extremity supported, Feet supported Sitting balance-Leahy Scale: Fair Sitting balance - Comments: BUE support needed                                   ADL either performed or assessed with clinical judgement   ADL Overall ADL's : Needs assistance/impaired Eating/Feeding: Independent;Bed level   Grooming: Minimal assistance;Sitting   Upper Body Bathing: Minimal assistance;Sitting   Lower Body Bathing: Maximal assistance   Upper Body Dressing : Minimal assistance   Lower Body Dressing: Maximal assistance   Toilet Transfer: Maximal assistance   Toileting- Clothing Manipulation and Hygiene: Maximal assistance       Functional mobility during ADLs: Minimal assistance       Vision   Vision Assessment?: No apparent visual deficits     Perception  Praxis      Pertinent Vitals/Pain Pain Assessment Pain Assessment: Faces Pain Score: 9  Faces Pain Scale: Hurts whole lot Pain Location: R knee Pain Descriptors / Indicators: Sharp Pain Intervention(s): Limited activity within patient's tolerance, Monitored during session, Repositioned     Hand Dominance Left   Extremity/Trunk Assessment  Upper Extremity Assessment Upper Extremity Assessment: Overall WFL for tasks assessed   Lower Extremity Assessment Lower Extremity Assessment: Defer to PT evaluation   Cervical / Trunk Assessment Cervical / Trunk Assessment: Normal   Communication Communication Communication: No difficulties   Cognition Arousal/Alertness: Awake/alert Behavior During Therapy: WFL for tasks assessed/performed Overall Cognitive Status: Within Functional Limits for tasks assessed                                 General Comments: slow to follow commands     General Comments  VSS on RA    Exercises     Shoulder Instructions      Home Living Family/patient expects to be discharged to:: Private residence Living Arrangements: Spouse/significant other;Children Available Help at Discharge: Family;Available 24 hours/day Type of Home: House Home Access: Stairs to enter [employer] of Steps: 3 Entrance Stairs-Rails: Left Home Layout: One level     Bathroom Shower/Tub: Teacher, early years/pre: Standard     Home Equipment: Conservation officer, nature (2 wheels)          Prior Functioning/Environment Prior Level of Function : Independent/Modified Independent             Mobility Comments: IND without DME before knee pain began, as it has gotten worse he has been using a RW and needing more help with transfers ADLs Comments: ind        OT Problem List: Pain;Decreased range of motion;Decreased strength;Decreased activity tolerance;Impaired balance (sitting and/or standing)      OT Treatment/Interventions: Self-care/ADL training;DME and/or AE instruction;Therapeutic activities;Patient/family education;Balance training    OT Goals(Current goals can be found in the care plan section) Acute Rehab OT Goals Patient Stated Goal: decrease pain OT Goal Formulation: With patient Time For Goal Achievement: 01/31/22 Potential to Achieve Goals: Good  OT Frequency: Min  2X/week    Co-evaluation              AM-PAC OT "6 Clicks" Daily Activity     Outcome Measure Help from another person eating meals?: None Help from another person taking care of personal grooming?: A Little Help from another person toileting, which includes using toliet, bedpan, or urinal?: A Lot Help from another person bathing (including washing, rinsing, drying)?: A Lot Help from another person to put on and taking off regular upper body clothing?: A Little Help from another person to put on and taking off regular lower body clothing?: A Lot 6 Click Score: 16   End of Session Nurse Communication: Mobility status;Patient requests pain meds;Other (comment) (condom cath fell off)  Activity Tolerance: Patient limited by pain Patient left: in bed;with call bell/phone within reach;with bed alarm set  OT Visit Diagnosis: Pain Pain - Right/Left: Right Pain - part of body: Knee                Time: 1439-1500 OT Time Calculation (min): 21 min Charges:  OT General Charges $OT Visit: 1 Visit OT Evaluation $OT Eval Moderate Complexity: 1 Mod  Shakyia Bosso, OTD, OTR/L Acute Rehab [phone] - [place] 01/17/2022, 3:25  PM

## 2022-01-17 NOTE — Transfer of Care (Signed)
Immediate Anesthesia Transfer of Care Note  Patient: [name]  Procedure(s) Performed: IRRIGATION AND DEBRIDEMENT KNEE (Right: Knee)  Patient Location: PACU  Anesthesia Type:General  Level of Consciousness: drowsy and patient cooperative  Airway & Oxygen Therapy: Patient Spontanous Breathing and Patient connected to face mask oxygen  Post-op Assessment: Report given to RN and Post -op Vital signs reviewed and stable  Post vital signs: Reviewed and stable  Last Vitals:  Vitals Value Taken Time  BP 127/77 01/17/22 1012  Temp    Pulse 81 01/17/22 1017  Resp 22 01/17/22 1017  SpO2 94 % 01/17/22 1017  Vitals shown include unvalidated device data.  Last Pain:  Vitals:   01/17/22 0730  TempSrc: Oral  PainSc: 4          Complications: No notable events documented.

## 2022-01-17 NOTE — Op Note (Signed)
[name], [name] MEDICAL RECORD NO: [id number] ACCOUNT NO: [id number] DATE OF BIRTH: 07-Sep-1960 FACILITY: [facility] LOCATION: [facility] PHYSICIAN: Astrid Divine. Marcelino Scot, MD  Operative Report   DATE OF PROCEDURE: 01/17/2022  PREOPERATIVE DIAGNOSES:   1.  Suspected right knee septic arthritis. 2.  Right superficial leg wound/bulla.  POSTOPERATIVE DIAGNOSES:   1.  Suspected right knee septic arthritis. 2.  Right superficial leg wound/bulla.  PROCEDURES: 1.  Arthrotomy, right knee with closure over drain. 2.  Application of biologic graft using Kerecis to the right leg.  SURGEON:  Astrid Divine. Marcelino Scot, MD.  ASSISTANT:  None.  ANESTHESIA:  General.  COMPLICATIONS:  None.  TOURNIQUET:  None.  SPECIMENS: 1.  Intra-articular synovium or tophaceous gout deposit sent to micro. 2.  Anaerobic swab. 3.  Aerobic swab, right knee.  DRAINS:  One medium, large Hemovac.  DISPOSITION:  The patient disposition to PACU.  CONDITION:  Stable.  BRIEF SUMMARY FOR PROCEDURE:  The patient is a 61 year old male who has been on the medicine service for sepsis with positive blood cultures on 01/01/2022.  He has had persistent right knee pain and swelling with several taps, which have not grown out  bacteria.  He has also been on empiric treatment for gout.  He underwent MRI, which showed large loculated effusion with areas of debris and significant concern for infection.  Cell count has been consistently elevated in the high 40s thousands and the  patient has failed to improve clinically with continued severe pain and with motion of the knee and warmth with a 3+ effusion. Given this clinical scenario we recommended arthrotomy with evacuation of the knee and breaking up of the loculations to make  sure that no septic arthritis was present.  The risk of persistent or recurrent infection, loss of motion, need for further surgery, anesthetic complications and others were discussed with the patient, he did wish to proceed  and also discussed with his  wife.  BRIEF SUMMARY:  The patient was taken to the operating room where general anesthesia was induced.  He remains on empiric antibiotics with vancomycin and cefepime.  The right lower extremity was carefully washed with chlorhexidine soap and then Betadine  scrub and paint.  The patient had a large skin tear that developed from a bulla over the right calf or ankle region, which was semi-circumferential and 8 cm x 8 cm.  After draping, a timeout was held.  I made a 5 cm incision laterally and above the  superior pole of the patella.  Hemostasis was obtained with electrocautery.  I then performed the arthrotomy with a copious on rush of fluid, which was not purulent, but did appear inflammatory.  There was also quite a bit of synovitis or free floating  debris within it.  Swabs were obtained for anaerobic and aerobic culture and then much of this fibrinous material passed into a specimen cup and sent for microbiology analysis as well.  I used my finger through the arthrotomy to break up loculated areas  which were considerable and then supplemented this with a chlorhexidine sponge and lastly with a lap sponge and a sponge stick.  A 6000 mL of saline were flushed through the knee joint while taking the knee through a gentle range of motion.  Layered  closure was performed with #1 PDS, 2-0 PDS, and vertical mattress 2-0 nylon.  Prior to closure, a medium, large Hemovac was brought out through the superior medial portal site and hooked to suction.  There were no  complications during the procedure.  I then turned attention distally to the leg where nonviable skin was debrided sharply with a scalpel.  The 8 x 8 cm area of exposed subcutaneous tissue was then treated with Kerecis, powder was applied, a large Mepitel dressing and then a gauze.  Sterile  dressing was applied proximally as well and then a 4 and 6-inch Ace wrap.  The patient was awakened from anesthesia and transferred  to PACU in stable condition.  PROGNOSIS: The biologic graft will assist with closure of this wound, adhesive should be avoided to his lower extremities, anticipate dressing change every 48 hours with removal of the Mepitel in 1-2 weeks and no further treatment is expected to be  necessary there.  The drain can be removed in 2 days from the knee.  We will follow culture of all the material and specimen sent, but at this time are hope is this confirmed a gout only diagnosis.   PUS D: 01/17/2022 10:57:51 am T: 01/17/2022 7:15:00 pm  JOB: [id number]/ [id number]

## 2022-01-18 ENCOUNTER — Encounter (HOSPITAL_COMMUNITY): Payer: Self-pay | Admitting: Orthopedic Surgery

## 2022-01-18 DIAGNOSIS — I350 Nonrheumatic aortic (valve) stenosis: Secondary | ICD-10-CM | POA: Diagnosis not present

## 2022-01-18 DIAGNOSIS — I1A0 Resistant hypertension: Secondary | ICD-10-CM | POA: Diagnosis not present

## 2022-01-18 DIAGNOSIS — I4819 Other persistent atrial fibrillation: Secondary | ICD-10-CM | POA: Diagnosis not present

## 2022-01-18 DIAGNOSIS — R6521 Severe sepsis with septic shock: Secondary | ICD-10-CM | POA: Diagnosis not present

## 2022-01-18 DIAGNOSIS — A419 Sepsis, unspecified organism: Secondary | ICD-10-CM | POA: Diagnosis not present

## 2022-01-18 LAB — CBC WITH DIFFERENTIAL/PLATELET
Abs Immature Granulocytes: 0.03 10*3/uL (ref 0.00–0.07)
Basophils Absolute: 0 10*3/uL (ref 0.0–0.1)
Basophils Relative: 0 %
Eosinophils Absolute: 0 10*3/uL (ref 0.0–0.5)
Eosinophils Relative: 0 %
HCT: 24.8 % — ABNORMAL LOW (ref 39.0–52.0)
Hemoglobin: 8.3 g/dL — ABNORMAL LOW (ref 13.0–17.0)
Immature Granulocytes: 1 %
Lymphocytes Relative: 14 %
Lymphs Abs: 0.8 10*3/uL (ref 0.7–4.0)
MCH: 23.9 pg — ABNORMAL LOW (ref 26.0–34.0)
MCHC: 33.5 g/dL (ref 30.0–36.0)
MCV: 71.5 fL — ABNORMAL LOW (ref 80.0–100.0)
Monocytes Absolute: 0.5 10*3/uL (ref 0.1–1.0)
Monocytes Relative: 9 %
Neutro Abs: 4.5 10*3/uL (ref 1.7–7.7)
Neutrophils Relative %: 76 %
Platelets: 173 10*3/uL (ref 150–400)
RBC: 3.47 MIL/uL — ABNORMAL LOW (ref 4.22–5.81)
RDW: 15.9 % — ABNORMAL HIGH (ref 11.5–15.5)
WBC: 5.9 10*3/uL (ref 4.0–10.5)
nRBC: 0 % (ref 0.0–0.2)

## 2022-01-18 LAB — COMPREHENSIVE METABOLIC PANEL
ALT: 14 U/L (ref 0–44)
AST: 16 U/L (ref 15–41)
Albumin: 2.2 g/dL — ABNORMAL LOW (ref 3.5–5.0)
Alkaline Phosphatase: 57 U/L (ref 38–126)
Anion gap: 7 (ref 5–15)
BUN: 20 mg/dL (ref 8–23)
CO2: 19 mmol/L — ABNORMAL LOW (ref 22–32)
Calcium: 8.7 mg/dL — ABNORMAL LOW (ref 8.9–10.3)
Chloride: 109 mmol/L (ref 98–111)
Creatinine, Ser: 1.36 mg/dL — ABNORMAL HIGH (ref 0.61–1.24)
GFR, Estimated: 59 mL/min — ABNORMAL LOW (ref >60)
Glucose, Bld: 140 mg/dL — ABNORMAL HIGH (ref 70–99)
Potassium: 4.6 mmol/L (ref 3.5–5.1)
Sodium: 135 mmol/L (ref 135–145)
Total Bilirubin: 0.7 mg/dL (ref 0.3–1.2)
Total Protein: 6.7 g/dL (ref 6.5–8.1)

## 2022-01-18 LAB — MAGNESIUM: Magnesium: 2.1 mg/dL (ref 1.7–2.4)

## 2022-01-18 LAB — PROCALCITONIN: Procalcitonin: 1.25 ng/mL

## 2022-01-18 LAB — BRAIN NATRIURETIC PEPTIDE: B Natriuretic Peptide: 669.1 pg/mL — ABNORMAL HIGH (ref 0.0–100.0)

## 2022-01-18 LAB — C-REACTIVE PROTEIN: CRP: 15.2 mg/dL — ABNORMAL HIGH (ref <1.0)

## 2022-01-18 MED ORDER — CLONIDINE HCL 0.2 MG PO TABS
0.2000 mg | ORAL_TABLET | Freq: Three times a day (TID) | ORAL | Status: DC
  Administered 2022-01-18 – 2022-01-24 (×19): 0.2 mg via ORAL
  Filled 2022-01-18 (×20): qty 1

## 2022-01-18 MED ORDER — FENTANYL CITRATE PF 50 MCG/ML IJ SOSY: 50.0000 ug | PREFILLED_SYRINGE | INTRAMUSCULAR | Status: DC | PRN

## 2022-01-18 MED ORDER — LABETALOL HCL 5 MG/ML IV SOLN: 10.0000 mg | INTRAVENOUS | Status: DC | PRN

## 2022-01-18 MED ORDER — OXYCODONE HCL 5 MG PO TABS
5.0000 mg | ORAL_TABLET | ORAL | Status: DC | PRN
  Administered 2022-01-18: 5 mg via ORAL
  Administered 2022-01-19 – 2022-01-22 (×6): 10 mg via ORAL
  Administered 2022-01-23 – 2022-01-24 (×2): 5 mg via ORAL
  Filled 2022-01-18: qty 2
  Filled 2022-01-18 (×3): qty 1
  Filled 2022-01-18 (×5): qty 2

## 2022-01-18 MED ORDER — CLONIDINE HCL 0.1 MG PO TABS: 0.1000 mg | ORAL_TABLET | Freq: Three times a day (TID) | ORAL | Status: DC

## 2022-01-18 MED ORDER — SODIUM CHLORIDE 0.9 % IV SOLN: INTRAVENOUS | Status: DC | PRN

## 2022-01-18 NOTE — Progress Notes (Signed)
Orthopaedic Trauma Service Progress Note  Patient ID: [name] MRN: [id number] DOB/AGE: 07/11/1960 61 y.o.  Subjective:  Doing ok R knee sore  Intra-op findings consistent with tophaceous gout   Intra-op cultures show NGTD  ROS As above  Objective:   VITALS:   Vitals:   01/18/22 0026 01/18/22 0330 01/18/22 0529 01/18/22 0803  BP: (!) 168/122 (!) 168/100  (!) 184/107  Pulse: 71 78  79  Resp: 20 13  18   Temp: 97.6 F (36.4 C) 97.9 F (36.6 C)  97.8 F (36.6 C)  TempSrc: Oral Oral  Oral  SpO2:    98%  Weight:   92.8 kg   Height:        Estimated body mass index is 30.21 kg/m as calculated from the following:   Height as of this encounter: 5\' 9"  (1.753 m).   Weight as of this encounter: 92.8 kg.   Intake/Output      10/16 0701 10/17 0700 10/17 0701 10/18 0700   P.O. 360    I.V. (mL/kg)     IV Piggyback 451    Total Intake(mL/kg) 811 (8.7)    Urine (mL/kg/hr) 125 (0.1) 700 (1.8)   Blood 100    Total Output 225 700   Net +586 -700          LABS  Results for orders placed or performed during the hospital encounter of 01/14/22 (from the past 24 hour(s))  Procalcitonin     Status: None   Collection Time: 01/18/22  1:47 AM  Result Value Ref Range   Procalcitonin 1.25 ng/mL  Magnesium     Status: None   Collection Time: 01/18/22  1:47 AM  Result Value Ref Range   Magnesium 2.1 1.7 - 2.4 mg/dL  Comprehensive metabolic panel     Status: Abnormal   Collection Time: 01/18/22  1:47 AM  Result Value Ref Range   Sodium 135 135 - 145 mmol/L   Potassium 4.6 3.5 - 5.1 mmol/L   Chloride 109 98 - 111 mmol/L   CO2 19 (L) 22 - 32 mmol/L   Glucose, Bld 140 (H) 70 - 99 mg/dL   BUN 20 8 - 23 mg/dL   Creatinine, Ser 1.36 (H) 0.61 - 1.24 mg/dL   Calcium 8.7 (L) 8.9 - 10.3 mg/dL   Total Protein 6.7 6.5 - 8.1 g/dL   Albumin 2.2 (L) 3.5 - 5.0 g/dL   AST 16 15 - 41 U/L   ALT 14 0 - 44 U/L    Alkaline Phosphatase 57 38 - 126 U/L   Total Bilirubin 0.7 0.3 - 1.2 mg/dL   GFR, Estimated 59 (L) >60 mL/min   Anion gap 7 5 - 15  C-reactive protein     Status: Abnormal   Collection Time: 01/18/22  1:47 AM  Result Value Ref Range   CRP 15.2 (H) <1.0 mg/dL  CBC with Differential/Platelet     Status: Abnormal   Collection Time: 01/18/22  1:47 AM  Result Value Ref Range   WBC 5.9 4.0 - 10.5 K/uL   RBC 3.47 (L) 4.22 - 5.81 MIL/uL   Hemoglobin 8.3 (L) 13.0 - 17.0 g/dL   HCT 24.8 (L) 39.0 - 52.0 %   MCV 71.5 (L) 80.0 - 100.0 fL   MCH 23.9 (L) 26.0 - 34.0 pg  MCHC 33.5 30.0 - 36.0 g/dL   RDW 15.9 (H) 11.5 - 15.5 %   Platelets 173 150 - 400 K/uL   nRBC 0.0 0.0 - 0.2 %   Neutrophils Relative % 76 %   Neutro Abs 4.5 1.7 - 7.7 K/uL   Lymphocytes Relative 14 %   Lymphs Abs 0.8 0.7 - 4.0 K/uL   Monocytes Relative 9 %   Monocytes Absolute 0.5 0.1 - 1.0 K/uL   Eosinophils Relative 0 %   Eosinophils Absolute 0.0 0.0 - 0.5 K/uL   Basophils Relative 0 %   Basophils Absolute 0.0 0.0 - 0.1 K/uL   Immature Granulocytes 1 %   Abs Immature Granulocytes 0.03 0.00 - 0.07 K/uL  Brain natriuretic peptide     Status: Abnormal   Collection Time: 01/18/22  1:47 AM  Result Value Ref Range   B Natriuretic Peptide 669.1 (H) 0.0 - 100.0 pg/mL     PHYSICAL EXAM:   Gen: resting comfortably in bed, NAD, pleasant  Lungs: unlabored Ext:       Right Lower Extremity   Dressing c/d/I  Ext warm   + DP pulse  No drainage in hemovac drain  Minimal swelling  Motor and sensory functions intact   No DCT       Assessment/Plan: 1 Day Post-Op     Anti-infectives (From admission, onward)    Start     Dose/Rate Route Frequency Ordered Stop   01/16/22 1400  vancomycin (VANCOCIN) IVPB 1000 mg/200 mL premix        1,000 mg 200 mL/hr over 60 Minutes Intravenous Every 24 hours 01/16/22 1219     01/15/22 1200  vancomycin (VANCOREADY) IVPB 1500 mg/300 mL        1,500 mg 150 mL/hr over 120 Minutes  Intravenous  Once 01/15/22 0157 01/15/22 1353   01/15/22 1000  ceFEPIme (MAXIPIME) 2 g in sodium chloride 0.9 % 100 mL IVPB        2 g 200 mL/hr over 30 Minutes Intravenous Every 12 hours 01/15/22 0138     01/15/22 0157  vancomycin variable dose per unstable renal function (pharmacist dosing)  Status:  Discontinued         Does not apply See admin instructions 01/15/22 0157 01/16/22 1220   01/14/22 2200  ceFEPIme (MAXIPIME) 2 g in sodium chloride 0.9 % 100 mL IVPB        2 g 200 mL/hr over 30 Minutes Intravenous  Once 01/14/22 2149 01/14/22 2311   01/14/22 2200  linezolid (ZYVOX) IVPB 600 mg        600 mg 300 mL/hr over 60 Minutes Intravenous Once 01/14/22 2149 01/15/22 0249     .  POD/HD#: 1   -R knee tophaceous gout flare  S/p I&D  WBAT   ROM as tolerated  Dressing change tomorrow along with dc drain   Therapy as tolerated    Follow up on cultures but currently does not appear to be septic arthritis   - Dispo:  Continue per medicine   Jari Pigg, PA-C [phone] (C) 01/18/2022, 11:13 AM  [facility] [place] [id number] [phone] Jenetta [phone] (F)    After 5pm and on the weekends please log on to Amion, go to orthopaedics and the look under the [facility] Call for the provider(s) on call. You can also call our office at [phone] and then follow the prompts to be connected to the call team.   Patient ID:  [name], male   DOB: 12-24-1960, 61 y.o.   MRN: [id number]

## 2022-01-18 NOTE — Progress Notes (Signed)
PT Cancellation Note  Patient Details Name: [name] MRN: [id number] DOB: 28-Sep-1960   Cancelled Treatment:    Reason Eval/Treat Not Completed: Pain limiting ability to participate. Checked on pt again. Pt had gotten up to chair. Still unwilling to work with PT due to knee pain.   Leighton Roach, PT  Acute Rehab Services Secure chat preferred Office [place] 01/18/2022, 4:08 PM

## 2022-01-18 NOTE — Progress Notes (Signed)
[facility] PROGRESS NOTE  Joseth Weigel  OZD:[id number] DOB: [birth year] DOA: 01/14/2022 PCP: [name], MD  Brief Narrative: [name] is a 61 y.o. male with a history of severe AS, HFpEF, AFib on eliquis, stage IIIa CKD, HTN, HLD, OSA not on CPAP who presented for the 2nd time in 2 days on 10/13 with increasing right knee pain. He had been admitted 9/30 with group A streptococcal bacteremia, ARF and RLE cellulitis as well as suspected gout of the right knee treated with steroid injection. With negative echo, penicillin and linezolid were given with improvement, transitioned to amoxicillin for 14 days per ID and discharged 10/5. He returned ultimately 10/13 with worsening pain and lethargy. He was septic with hypotension responsive to resuscitation, leukocytosis (WBC 14), AKI (SCr 2.4 from 1.5). IV antibiotics given, cultures drawn, and ultimately MR right knee revealed mod-large complex effusion, synovial thickening, lateral femoral condyle focal erosion, minimal adjacent marrow edema, extensive muscular edema, quadriceps tendinosis with partial-thickness tear. 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   Subjective: right knee pain is severe, not sufficiently improved with current pain medications which cause him to itch all over without rash worse than oxycodone did. No chest pain or dyspnea. Tried to get up yesterday but couldn't get OOB due to pain.   Objective: BP (!) 184/107 (BP Location: Right Arm)   Pulse 79   Temp 97.8 F (36.6 C) (Oral)   Resp 18   Ht 5\' 9"  (1.753 m)   Wt 92.8 kg   SpO2 98%   BMI 30.21 kg/m   Gen: 61 y.o. male in no distress Pulm: Nonlabored breathing room air. Clear. CV: Irreg irreg, rate in 70's with stable III/VI holosystolic murmur at the base without rub, or gallop. No JVD, no dependent edema. GI: Abdomen soft, non-tender, non-distended, with normoactive bowel sounds.  Ext: Warm, no  deformities Skin: Right leg with large ACE wrap which is c/d/I. Distal foot is warm and dry with intact sensation and motor function.  Neuro: Alert and oriented. No focal neurological deficits. Psych: Judgement and insight appear fair. Mood euthymic & affect congruent. Behavior is appropriate.    Assessment & Plan: Severe sepsis with AKI: Don't believe septic shock is correct since hypotension was responsive to IV fluids.  - Continue vancomycin, cefepime for now. With recent history of S. pyogenes bacteremia, plan was oral amoxicillin 1g q8h through 10/15. Will request ID input regarding this.  - Monitor blood cultures, NGTD. - Pt to continue coverage inclusive of group A strep per recent bacteremia.   Right knee pain concerning for tophaceous gout flare vs. septic arthritis with adjoining myositis:  - s/p I&D per orthopedics 10/16. Negative gram stain, culture NGTD, operative findings said to be consistent with tophaceous gout.  - Adjustments to pain control, change to oxycodone as he tolerated this better in the past. Also can give IV fentanyl (least histaminergic) prn.   AKI on stage IIIa CKD:  - Avoid nephrotoxins.   Persistent AFib with RVR: Rate stabilized - Continue coreg, cardizem - Restarted eliquis postoperatively, ok by ortho.  HTN, chronic HFpEF, aortic stenosis: - Coreg 25mg  po BID, irbesartan 30mg  daily, diltiazem 30mg  q6h. Some medications held due to initial hypotension, will restart now that BPs rising. D/w [name], restart clonidine to avoid rebound.  - BNP down from prior, required IVF resuscitation, will avoid hypotension and monitor I/O, volume status closely  - Follow up with [name] after all  infection has been treated to pursue AVR. Also discussing MAZE.   CAD: No angina. Plan per cardiology.   HLD:  - Resume statin  OSA:  - Nocturnal O2, pt not on CPAP  Patrecia Pour, MD [facility] [email or web address] 01/18/2022, 3:14 PM

## 2022-01-18 NOTE — Anesthesia Postprocedure Evaluation (Signed)
Anesthesia Post Note  Patient: [name]  Procedure(s) Performed: IRRIGATION AND DEBRIDEMENT KNEE (Right: Knee)     Patient location during evaluation: PACU Anesthesia Type: General Level of consciousness: sedated and patient cooperative Pain management: pain level controlled Vital Signs Assessment: post-procedure vital signs reviewed and stable Respiratory status: spontaneous breathing Cardiovascular status: stable Anesthetic complications: no   No notable events documented.  Last Vitals:  Vitals:   01/18/22 0026 01/18/22 0330  BP: (!) 168/122 (!) 168/100  Pulse: 71 78  Resp: 20 13  Temp: 36.4 C 36.6 C  SpO2:      Last Pain:  Vitals:   01/18/22 0400  TempSrc:   PainSc: [place]

## 2022-01-18 NOTE — Progress Notes (Addendum)
Rounding Note    Patient Name: [name] Date of Encounter: 01/18/2022  [facility] Cardiologist: Little Ishikawa, MD   Subjective   No chest pain or dyspnea   Inpatient Medications    Scheduled Meds:  apixaban  5 mg Oral BID   aspirin EC  81 mg Oral Daily   atorvastatin  20 mg Oral Daily   carvedilol  25 mg Oral BID WC   diltiazem  30 mg Oral Q6H   ferrous sulfate  325 mg Oral QODAY   irbesartan  300 mg Oral Daily   Continuous Infusions:  ceFEPime (MAXIPIME) IV Stopped (01/17/22 2315)   vancomycin 1,000 mg (01/17/22 1431)   PRN Meds: acetaminophen **OR** acetaminophen, albuterol, fentaNYL (SUBLIMAZE) injection, HYDROcodone-acetaminophen, hydrOXYzine, labetalol, ondansetron **OR** ondansetron (ZOFRAN) IV   Vital Signs    Vitals:   01/18/22 0026 01/18/22 0330 01/18/22 0529 01/18/22 0803  BP: (!) 168/122 (!) 168/100  (!) 184/107  Pulse: 71 78  79  Resp: 20 13  18   Temp: 97.6 F (36.4 C) 97.9 F (36.6 C)  97.8 F (36.6 C)  TempSrc: Oral Oral  Oral  SpO2:    98%  Weight:   92.8 kg   Height:        Intake/Output Summary (Last 24 hours) at 01/18/2022 0849 Last data filed at 01/18/2022 0807 Gross per 24 hour  Intake 811.03 ml  Output 925 ml  Net -113.97 ml      01/18/2022    5:29 AM 01/15/2022    1:30 AM 01/06/2022    4:27 AM  Last 3 Weights  Weight (lbs) 204 lb 9.4 oz 207 lb 14.3 oz 207 lb 14.3 oz  Weight (kg) 92.8 kg 94.3 kg 94.3 kg      Telemetry    Afib at controlled rate - Personally Reviewed  ECG    N/a  Physical Exam   GEN: No acute distress.   Neck: No JVD Cardiac: irregular, 3/6 systolic murmurs, rubs, or gallops.  Respiratory: Clear to auscultation bilaterally. GI: Soft, nontender, non-distended  MS: No edema; R leg warp with drain  Neuro:  Nonfocal  Psych: Normal affect   Labs    High Sensitivity Troponin:   Recent Labs  Lab 01/13/22 0424 01/14/22 2128 01/15/22 0150  TROPONINIHS 16 29* 29*      Chemistry Recent Labs  Lab 01/16/22 0253 01/17/22 0156 01/18/22 0147  NA 137 135 135  K 3.8 4.0 4.6  CL 108 107 109  CO2 20* 21* 19*  GLUCOSE 96 98 140*  BUN 22 17 20   CREATININE 1.62* 1.48* 1.36*  CALCIUM 8.9 9.0 8.7*  MG 1.9 1.8 2.1  PROT 6.7 6.7 6.7  ALBUMIN 2.3* 2.3* 2.2*  AST 12* 11* 16  ALT 13 12 14   ALKPHOS 49 57 57  BILITOT 0.9 0.8 0.7  GFRNONAA 48* 53* 59*  ANIONGAP 9 7 7     Lipids No results for input(s): "CHOL", "TRIG", "HDL", "LABVLDL", "LDLCALC", "CHOLHDL" in the last 168 hours.  Hematology Recent Labs  Lab 01/16/22 0253 01/17/22 0156 01/18/22 0147  WBC 8.1 6.6 5.9  RBC 3.77* 3.66* 3.47*  HGB 9.2* 8.6* 8.3*  HCT 26.4* 25.9* 24.8*  MCV 70.0* 70.8* 71.5*  MCH 24.4* 23.5* 23.9*  MCHC 34.8 33.2 33.5  RDW 15.9* 15.4 15.9*  PLT 201 179 173   Thyroid No results for input(s): "TSH", "FREET4" in the last 168 hours.  BNP Recent Labs  Lab 01/16/22 0253 01/17/22 0156 01/18/22 0147  BNP 528.0* 546.3* 669.1*    DDimer No results for input(s): "DDIMER" in the last 168 hours.   Radiology    MR KNEE RIGHT WO CONTRAST  Result Date: 01/16/2022 CLINICAL DATA:  Septic arthritis suspected, knee, xray done EXAM: MRI OF THE RIGHT KNEE WITHOUT CONTRAST TECHNIQUE: Multiplanar, multisequence MR imaging of the knee was performed. No intravenous contrast was administered. COMPARISON:  CT 01/02/2022.  X-Moe 01/14/2022 FINDINGS: Bones/Joint/Cartilage Moderate-large complex joint effusion with mass-like synovial thickening in the suprapatellar pouch. Areas of peripheral low T2 signal within the joint may represent hemosiderin staining from prior hemarthrosis. Focal erosion along the peripheral margin of the lateral femoral condyle adjacent to the popliteus tendon insertion (series 4, image 21). Minimal adjacent marrow edema. Subchondral marrow edema within the lateral aspect of the patella, nonspecific. No additional sites of erosion or marrow edema. No fracture. No  dislocation. Mild tricompartmental degenerative changes. Ligaments Intact cruciate and collateral ligaments. Muscles and Tendons Extensive intramuscular edema, most pronounced within the visualized distal quadriceps musculature and the proximal calf musculature. Quadriceps tendinosis with partial-thickness interstitial and insertional tearing. No full-thickness or retracted tear. Lateral patellar retinaculum is thickened and irregular. Soft tissues Extensive soft tissue edema and ill-defined fluid at the anterior and lateral aspect of the knee. No organized or drainable fluid collections within the superficial soft tissues. Generalized periarticular edema. IMPRESSION: 1. Moderate-large complex joint effusion with mass-like synovial thickening in the suprapatellar pouch. This could represent septic arthritis or changes related to inflammatory or crystalline arthropathy. Correlate with joint fluid analysis from recently performed knee aspiration. 2. Focal erosion along the peripheral margin of the lateral femoral condyle adjacent to the popliteus tendon insertion. Minimal adjacent marrow edema. This can be seen with gout as well as osteomyelitis in the setting of infection. 3. Extensive intramuscular edema, most pronounced within the visualized distal quadriceps musculature and the proximal calf musculature compatible with a nonspecific myositis. 4. Quadriceps tendinosis with partial-thickness interstitial and insertional tearing. No full-thickness or retracted tear. Electronically Signed   By: Davina Poke D.O.   On: 01/16/2022 13:53    Cardiac Studies   Non this admission   Patient Profile     61 y.o. male with history of known severe aortic stenosis and CAD (waiting CABG and AVR), chronic diastolic congestive heart failure, peripheral arterial disease, LVH, persistent atrial fibrillation, HLD and resistant hypertension who admitted 9/30-9/5 with sepsis group A step bacteremia due to cellulitis of legs,  septic arthritis of Rt swollen knee who presented 10/13 with worsening pain and lethargy. Admitte for sepsis with AKI and hypotension. Improved with hydration. Found to have right knee septic arthritis with adjoining myositis s/p I  & D yesterday.  Cardiology is asked for elevated heart rate.  Assessment & Plan    1.  Atrial fibrillation with rapid ventricular heart rate -Elevated heart rate in setting of septic arthritis.  Treated with IV Cardizem initially with improved heart rate. -Eliquis held initially and then resume yesterday>> management per primary team given  procedure yesterday -Heart rate currently stable -Continue carvedilol 25 mg twice daily and diltiazem 30 mg every 6 hours  2.  CAD  3.  Severe aortic stenosis - planning AVR once recovers from infection - No CP or dyspnea  4.  Hypertension -Blood pressure was normal yesterday and now gradually increasing -Continue carvedilol 25 mg twice daily, irbesartan 30 mg daily and Cardizem 30 mg every 6 hours -He is taking clonidine 0.2 mg TID at home, would favor not stopping  abruptly given the risk of withdrawal.  Will restart clonidine   For questions or updates, please contact [facility] Please consult [email or web address] for contact info under        SignedManson Passey, PA  01/18/2022, 8:49 AM     Patient seen and examined.  Agree with above documentation.  On exam, patient is alert and oriented, irregular rhythm, 3/6 systolic murmur, lungs CTAB, no LE edema or JVD.  He is admitted with a right knee septic arthritis, underwent I&D 10/16.  Restarted on Eliquis.  BP elevated, will restart clonidine to avoid withdrawal.  Little Ishikawa, MD

## 2022-01-18 NOTE — Progress Notes (Signed)
PT Cancellation Note  Patient Details Name: [name] MRN: [id number] DOB: 10-07-1960   Cancelled Treatment:    Reason Eval/Treat Not Completed: Pain limiting ability to participate. Pt strongly encouraged to get up but pt adamant that his knee is too painful. Will check back later as time allows.   Leighton Roach, PT  Acute Rehab Services Secure chat preferred Office [place] 01/18/2022, 11:15 AM

## 2022-01-19 ENCOUNTER — Inpatient Hospital Stay: Payer: Self-pay

## 2022-01-19 DIAGNOSIS — I350 Nonrheumatic aortic (valve) stenosis: Secondary | ICD-10-CM | POA: Diagnosis not present

## 2022-01-19 DIAGNOSIS — R6521 Severe sepsis with septic shock: Secondary | ICD-10-CM | POA: Diagnosis not present

## 2022-01-19 DIAGNOSIS — I4819 Other persistent atrial fibrillation: Secondary | ICD-10-CM | POA: Diagnosis not present

## 2022-01-19 DIAGNOSIS — A419 Sepsis, unspecified organism: Secondary | ICD-10-CM | POA: Diagnosis not present

## 2022-01-19 DIAGNOSIS — I1A0 Resistant hypertension: Secondary | ICD-10-CM | POA: Diagnosis not present

## 2022-01-19 LAB — CBC WITH DIFFERENTIAL/PLATELET
Abs Immature Granulocytes: 0.04 10*3/uL (ref 0.00–0.07)
Basophils Absolute: 0 10*3/uL (ref 0.0–0.1)
Basophils Relative: 0 %
Eosinophils Absolute: 0.1 10*3/uL (ref 0.0–0.5)
Eosinophils Relative: 1 %
HCT: 24.1 % — ABNORMAL LOW (ref 39.0–52.0)
Hemoglobin: 7.9 g/dL — ABNORMAL LOW (ref 13.0–17.0)
Immature Granulocytes: 1 %
Lymphocytes Relative: 23 %
Lymphs Abs: 1.4 10*3/uL (ref 0.7–4.0)
MCH: 23.2 pg — ABNORMAL LOW (ref 26.0–34.0)
MCHC: 32.8 g/dL (ref 30.0–36.0)
MCV: 70.9 fL — ABNORMAL LOW (ref 80.0–100.0)
Monocytes Absolute: 0.6 10*3/uL (ref 0.1–1.0)
Monocytes Relative: 9 %
Neutro Abs: 4.2 10*3/uL (ref 1.7–7.7)
Neutrophils Relative %: 66 %
Platelets: 187 10*3/uL (ref 150–400)
RBC: 3.4 MIL/uL — ABNORMAL LOW (ref 4.22–5.81)
RDW: 15.9 % — ABNORMAL HIGH (ref 11.5–15.5)
WBC: 6.3 10*3/uL (ref 4.0–10.5)
nRBC: 0 % (ref 0.0–0.2)

## 2022-01-19 LAB — COMPREHENSIVE METABOLIC PANEL
ALT: 18 U/L (ref 0–44)
AST: 18 U/L (ref 15–41)
Albumin: 2.1 g/dL — ABNORMAL LOW (ref 3.5–5.0)
Alkaline Phosphatase: 61 U/L (ref 38–126)
Anion gap: 7 (ref 5–15)
BUN: 24 mg/dL — ABNORMAL HIGH (ref 8–23)
CO2: 21 mmol/L — ABNORMAL LOW (ref 22–32)
Calcium: 8.9 mg/dL (ref 8.9–10.3)
Chloride: 111 mmol/L (ref 98–111)
Creatinine, Ser: 1.46 mg/dL — ABNORMAL HIGH (ref 0.61–1.24)
GFR, Estimated: 54 mL/min — ABNORMAL LOW (ref >60)
Glucose, Bld: 165 mg/dL — ABNORMAL HIGH (ref 70–99)
Potassium: 4 mmol/L (ref 3.5–5.1)
Sodium: 139 mmol/L (ref 135–145)
Total Bilirubin: 0.6 mg/dL (ref 0.3–1.2)
Total Protein: 6.4 g/dL — ABNORMAL LOW (ref 6.5–8.1)

## 2022-01-19 LAB — BODY FLUID CULTURE W GRAM STAIN: Culture: NO GROWTH

## 2022-01-19 LAB — CULTURE, BLOOD (ROUTINE X 2): Culture: NO GROWTH

## 2022-01-19 LAB — C-REACTIVE PROTEIN: CRP: 6.8 mg/dL — ABNORMAL HIGH (ref <1.0)

## 2022-01-19 LAB — BRAIN NATRIURETIC PEPTIDE: B Natriuretic Peptide: 478.3 pg/mL — ABNORMAL HIGH (ref 0.0–100.0)

## 2022-01-19 LAB — MAGNESIUM: Magnesium: 2.1 mg/dL (ref 1.7–2.4)

## 2022-01-19 LAB — PROCALCITONIN: Procalcitonin: 0.75 ng/mL

## 2022-01-19 MED ORDER — PENICILLIN G POTASSIUM 20000000 UNITS IJ SOLR
12.0000 10*6.[IU] | Freq: Two times a day (BID) | INTRAVENOUS | Status: DC
  Administered 2022-01-19 – 2022-01-21 (×4): 12 10*6.[IU] via INTRAVENOUS
  Filled 2022-01-19 (×5): qty 12

## 2022-01-19 MED ORDER — DILTIAZEM HCL ER COATED BEADS 120 MG PO CP24
120.0000 mg | ORAL_CAPSULE | Freq: Every day | ORAL | Status: DC
  Administered 2022-01-19: 120 mg via ORAL
  Filled 2022-01-19: qty 1

## 2022-01-19 MED ORDER — HYDRALAZINE HCL 25 MG PO TABS
25.0000 mg | ORAL_TABLET | Freq: Three times a day (TID) | ORAL | Status: DC
  Administered 2022-01-19 – 2022-01-20 (×3): 25 mg via ORAL
  Filled 2022-01-19 (×3): qty 1

## 2022-01-19 NOTE — Progress Notes (Signed)
Orthopaedic Trauma Service Progress Note  Patient ID: [name] MRN: [id number] DOB/AGE: [birth year] 61 y.o.  Subjective:  Doing fair Ambulated yesterday with RN   R knee still sore   Cultures still with NGTD  ROS As above  Objective:   VITALS:   Vitals:   01/18/22 1959 01/18/22 2120 01/19/22 0518 01/19/22 0553  BP: (!) 122/97 126/81 (!) 150/89   Pulse: 76 75 64   Resp: 18   14  Temp:  98.1 F (36.7 C) 97.8 F (36.6 C)   TempSrc:  Oral Oral   SpO2: 96%     Weight:    94.8 kg  Height:        Estimated body mass index is 30.86 kg/m as calculated from the following:   Height as of this encounter: 5\' 9"  (1.753 m).   Weight as of this encounter: 94.8 kg.   Intake/Output      10/17 0701 10/18 0700 10/18 0701 10/19 0700   P.O. 630    I.V. (mL/kg) 24.7 (0.3)    IV Piggyback 430.5    Total Intake(mL/kg) 1085.2 (11.4)    Urine (mL/kg/hr) 1250 (0.5)    Stool 0    Blood     Total Output 1250    Net -164.8         Stool Occurrence 2 x      LABS  Results for orders placed or performed during the hospital encounter of 01/14/22 (from the past 24 hour(s))  Procalcitonin     Status: None   Collection Time: 01/19/22  2:11 AM  Result Value Ref Range   Procalcitonin 0.75 ng/mL  Magnesium     Status: None   Collection Time: 01/19/22  2:11 AM  Result Value Ref Range   Magnesium 2.1 1.7 - 2.4 mg/dL  Comprehensive metabolic panel     Status: Abnormal   Collection Time: 01/19/22  2:11 AM  Result Value Ref Range   Sodium 139 135 - 145 mmol/L   Potassium 4.0 3.5 - 5.1 mmol/L   Chloride 111 98 - 111 mmol/L   CO2 21 (L) 22 - 32 mmol/L   Glucose, Bld 165 (H) 70 - 99 mg/dL   BUN 24 (H) 8 - 23 mg/dL   Creatinine, Ser 1.46 (H) 0.61 - 1.24 mg/dL   Calcium 8.9 8.9 - 10.3 mg/dL   Total Protein 6.4 (L) 6.5 - 8.1 g/dL   Albumin 2.1 (L) 3.5 - 5.0 g/dL   AST 18 15 - 41 U/L   ALT 18 0 - 44 U/L    Alkaline Phosphatase 61 38 - 126 U/L   Total Bilirubin 0.6 0.3 - 1.2 mg/dL   GFR, Estimated 54 (L) >60 mL/min   Anion gap 7 5 - 15  C-reactive protein     Status: Abnormal   Collection Time: 01/19/22  2:11 AM  Result Value Ref Range   CRP 6.8 (H) <1.0 mg/dL  CBC with Differential/Platelet     Status: Abnormal   Collection Time: 01/19/22  2:11 AM  Result Value Ref Range   WBC 6.3 4.0 - 10.5 K/uL   RBC 3.40 (L) 4.22 - 5.81 MIL/uL   Hemoglobin 7.9 (L) 13.0 - 17.0 g/dL   HCT 24.1 (L) 39.0 - 52.0 %   MCV 70.9 (L) 80.0 - 100.0 fL  MCH 23.2 (L) 26.0 - 34.0 pg   MCHC 32.8 30.0 - 36.0 g/dL   RDW 15.9 (H) 11.5 - 15.5 %   Platelets 187 150 - 400 K/uL   nRBC 0.0 0.0 - 0.2 %   Neutrophils Relative % 66 %   Neutro Abs 4.2 1.7 - 7.7 K/uL   Lymphocytes Relative 23 %   Lymphs Abs 1.4 0.7 - 4.0 K/uL   Monocytes Relative 9 %   Monocytes Absolute 0.6 0.1 - 1.0 K/uL   Eosinophils Relative 1 %   Eosinophils Absolute 0.1 0.0 - 0.5 K/uL   Basophils Relative 0 %   Basophils Absolute 0.0 0.0 - 0.1 K/uL   Immature Granulocytes 1 %   Abs Immature Granulocytes 0.04 0.00 - 0.07 K/uL  Brain natriuretic peptide     Status: Abnormal   Collection Time: 01/19/22  2:11 AM  Result Value Ref Range   B Natriuretic Peptide 478.3 (H) 0.0 - 100.0 pg/mL     PHYSICAL EXAM:     Gen: resting comfortably in bed, NAD, pleasant  Lungs: unlabored Ext:       Right Lower Extremity              Dressing c/d/I   Dressings removed    All wounds are stable   New dressing applied to lower leg wound    Keracis powder still present   Drain with no drainage in container    Drain removed without difficulty   Skin with dry/woody texture consistent with chronic edema              Ext warm              + DP pulse             No drainage in hemovac drain             Minimal swelling             Motor and sensory functions intact              No DCT   Assessment/Plan: 2 Days Post-Op   Principal Problem:   Severe  sepsis with septic shock ([place])   Anti-infectives (From admission, onward)    Start     Dose/Rate Route Frequency Ordered Stop   01/16/22 1400  vancomycin (VANCOCIN) IVPB 1000 mg/200 mL premix        1,000 mg 200 mL/hr over 60 Minutes Intravenous Every 24 hours 01/16/22 1219     01/15/22 1200  vancomycin (VANCOREADY) IVPB 1500 mg/300 mL        1,500 mg 150 mL/hr over 120 Minutes Intravenous  Once 01/15/22 0157 01/15/22 1353   01/15/22 1000  ceFEPIme (MAXIPIME) 2 g in sodium chloride 0.9 % 100 mL IVPB        2 g 200 mL/hr over 30 Minutes Intravenous Every 12 hours 01/15/22 0138     01/15/22 0157  vancomycin variable dose per unstable renal function (pharmacist dosing)  Status:  Discontinued         Does not apply See admin instructions 01/15/22 0157 01/16/22 1220   01/14/22 2200  ceFEPIme (MAXIPIME) 2 g in sodium chloride 0.9 % 100 mL IVPB        2 g 200 mL/hr over 30 Minutes Intravenous  Once 01/14/22 2149 01/14/22 2311   01/14/22 2200  linezolid (ZYVOX) IVPB 600 mg        600 mg 300 mL/hr over 60 Minutes Intravenous Once  01/14/22 2149 01/15/22 0249     .  POD/HD#: 2  -R knee tophaceous gout flare             S/p I&D             WBAT              ROM as tolerated             Dressing changed today and hemovac removed   Dressing changes as needed   Ice PRN               Therapy as tolerated                Follow up on cultures but currently does not appear to be septic arthritis   - chronic venous stasis wound R lower leg   Keracis powder in place  Dressing changed over mepitel (new 4x4s and kerlix). Change again Friday    - Dispo:             Continue per medicine      Jari Pigg, PA-C [phone] (C) 01/19/2022, 10:35 AM  [facility] [place] [address] [phone] Jenetta [phone] (F)    After 5pm and on the weekends please log on to Amion, go to orthopaedics and the look under the [facility] Call for  the provider(s) on call. You can also call our office at [phone] and then follow the prompts to be connected to the call team.   Patient ID: [name], male   DOB: 07/07/1960, 61 y.o.   MRN: [id number]

## 2022-01-19 NOTE — Telephone Encounter (Signed)
Called patient, not able to make contact or leave voicemail. Provider is out of office on leave, I will send to covering provider for review.

## 2022-01-19 NOTE — Care Management Important Message (Signed)
Important Message  Patient Details  Name: [name] MRN: [id number] Date of Birth: June 22, 1960   Medicare Important Message Given:  Yes     Shelda Altes 01/19/2022, 11:13 AM

## 2022-01-19 NOTE — Progress Notes (Signed)
PHARMACY CONSULT NOTE FOR:  OUTPATIENT  PARENTERAL ANTIBIOTIC THERAPY (OPAT)  Indication: R-septic knee  Regimen: Penicillin 24 million units as a continuous infusion over 24 hours End date: 02/14/22  IV antibiotic discharge orders are pended. To discharging provider:  please sign these orders via discharge navigator,  Select New Orders & click on the button choice - Manage This Unsigned Work.     Thank you for allowing pharmacy to be a part of this patient's care.  Alycia Rossetti, PharmD, BCPS Infectious Diseases Clinical Pharmacist 01/20/2022 7:27 AM   **Pharmacist phone directory can now be found on [email or web address] ([email or web address]).  Listed under [place].

## 2022-01-19 NOTE — TOC Initial Note (Signed)
Transition of Care [facility]) - Initial/Assessment Note    Patient Details  Name: [name] MRN: [id number] Date of Birth: [birth year]  Transition of Care [facility]) CM/SW Contact:    Bethena Roys, RN Phone Number: 01/19/2022, 4:06 PM  Clinical Narrative:  Patient presented for right knee swelling and pain with generalized weakness. PTA patient was at home with significant other. Significant other states their home will need renovations soon and they will be staying [facility] off [address] Case Manager discussed home health services; patient is agreeable to home health services and the Medicare.gov list provided to patient for choice. Case Manager will be following for home IV antibiotics. [facility] will be following the patient for IV antibiotics if needed. Case Manager will continue to follow for additional transition of care needs as the patient progresses.    Expected Discharge Plan: [place] Barriers to Discharge: Continued Medical Work up   Patient Goals and CMS Choice Patient states their goals for this hospitalization and ongoing recovery are:: patient may need home IV antibiotics-plan to go to an extended stay hotel. Home being rennovated. CMS Medicare.gov Compare Post Acute Care list provided to:: Patient Choice offered to / list presented to : Patient, Spouse  Expected Discharge Plan and Services Expected Discharge Plan: [place]   Discharge Planning Services: CM Consult Post Acute Care Choice: [place] arrangements for the past 2 months: Single Family Home                 DME Arranged: IV pump/equipment Engineer, manufacturing systems)         HH Arranged: RN, Disease Management, PT, OT          Prior Living Arrangements/Services Living arrangements for the past 2 months: Single Family Home Lives with:: Spouse, Minor Children Patient language and need for interpreter reviewed:: Yes Do you feel safe going back  to the place where you live?: Yes      Need for Family Participation in Patient Care: Yes (Comment) Care giver support system in place?: Yes (comment)   Criminal Activity/Legal Involvement Pertinent to Current Situation/Hospitalization: No - Comment as needed  Activities of Daily Living Home Assistive Devices/Equipment: None ADL Screening (condition at time of admission) Patient's cognitive ability adequate to safely complete daily activities?: Yes Is the patient deaf or have difficulty hearing?: No Does the patient have difficulty seeing, even when wearing glasses/contacts?: No Does the patient have difficulty concentrating, remembering, or making decisions?: No Patient able to express need for assistance with ADLs?: Yes Does the patient have difficulty dressing or bathing?: No Independently performs ADLs?: Yes (appropriate for developmental age) Does the patient have difficulty walking or climbing stairs?: Yes Weakness of Legs: Both Weakness of Arms/Hands: None  Permission Sought/Granted Permission sought to share information with : Family Supports, Case Freight forwarder, Chartered certified accountant granted to share information with : Yes, Verbal Permission Granted              Emotional Assessment Appearance:: Appears stated age Attitude/Demeanor/Rapport: Engaged Affect (typically observed): Appropriate Orientation: : Oriented to Situation, Oriented to  Time, Oriented to Place, Oriented to Self Alcohol / Substance Use: Not Applicable Psych Involvement: No (comment)  Admission diagnosis:  AKI (acute kidney injury) ([place]) [N17.9] Sepsis ([place]) [A41.9] Patient Active Problem List   Diagnosis Date Noted   Severe sepsis with septic shock ([place]) 01/15/2022   Pyogenic arthritis of right knee joint ([place])    Obesity (BMI 30-39.9)  01/03/2022   Iron deficiency anemia 01/03/2022   Gout of right knee 01/03/2022   Sepsis due to group A Streptococcus with acute renal failure  without septic shock ([facility])    AKI (acute kidney injury) ([place]) 01/01/2022   Magnesium deficiency 11/23/2021   Peripheral artery disease ([place]) 09/20/2021   Open wound of lower extremity bilateral 09/20/2021   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   Severe aortic stenosis    Persistent atrial fibrillation ([place]) 05/05/2021   Secondary hypercoagulable state ([place]) 05/05/2021   Chronic back pain 11/05/2020   History of chronic skin ulcer 11/05/2020   OA (osteoarthritis) 11/05/2020   OSA (obstructive sleep apnea) 11/05/2020   Stage 3a chronic kidney disease (CKD) ([place]) 02/19/2020   Chronic diastolic heart failure ([place]) 07/25/2017   HTN (hypertension) 10/19/2011   Gout 10/19/2011   Hyperlipemia 10/19/2011   GERD (gastroesophageal reflux disease) 08/27/2011   PCP:  [name], MD Pharmacy:   [facility] [place], [address] [place] [address] Phone: [phone] Fax: [phone]  [facility] [address] [place] [address] Phone: [phone] Fax: [phone]  Readmission Risk Interventions    01/19/2022    3:56 PM  Readmission Risk Prevention Plan  Transportation Screening Complete  Home Care Screening Complete  Medication Review (RN CM) Complete
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
## 2022-01-19 NOTE — Progress Notes (Signed)
Physical Therapy Treatment Patient Details Name: [name] MRN: [id number] DOB: 1960/08/22 Today's Date: 01/19/2022   History of Present Illness Pt is a 61 y.o. male who presented 01/14/22 with progressive pain in R knee and weakness. S/p R knee aspiration 10/14. Patient recently hospitalized 01/01/2022 through 01/06/2022 with diagnosis of sepsis due to group A streptococcus with acute renal failure in the setting of lower extremity cellulitis and swollen knee.  At the time septic arthritis was ruled out. S/p I & D to R knee on 10/16. 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 PMH: HTN, CHF, HLD, gout, CKD, afib, PAD, aortic stenosis, cellulitis, chronic back pain.    PT Comments    Pt seen for PT tx with co-tx with OT; pt premedicated prior to session. Pt more amenable to participating with ongoing encouragement/education from therapists. Pt with c/o R knee pain & unable to lift RLE against gravity while supine in bed but is able to complete supine>sit with min assist, extra time, & hospital bed features. Pt completes STS from EOB with min assist +2 & ambulates into hallway with RW & CGA fade to supervision. Pt demonstrates significantly slow gait pattern & requires cuing/encouragement to increase gait speed with fair return demo. Pt progressing to more normalized gait pattern by end of session. Pt would benefit from ongoing acute PT services to address gait with LRAD & stair negotiation.    Recommendations for follow up therapy are one component of a multi-disciplinary discharge planning process, led by the attending physician.  Recommendations may be updated based on patient status, additional functional criteria and insurance authorization.  Follow Up Recommendations  Home health PT     Assistance Recommended at Discharge Intermittent Supervision/Assistance  Patient can return home with the following A little help with bathing/dressing/bathroom;Help with stairs or ramp for entrance;Assist for  transportation;Assistance with cooking/housework;A little help with walking and/or transfers   Equipment Recommendations  None recommended by PT    Recommendations for Other Services       Precautions / Restrictions Precautions Precautions: Fall Precaution Comments: R leg hypersensitive Restrictions Weight Bearing Restrictions: Yes RLE Weight Bearing: Weight bearing as tolerated Other Position/Activity Restrictions: WBAT and ROM as tolerated to R leg     Mobility  Bed Mobility Overal bed mobility: Needs Assistance Bed Mobility: Supine to Sit     Supine to sit: Min assist     General bed mobility comments: extra time to come to sitting EOB, requests assistance for moving RLE toward EOB, use of bed rails PRN, HOB elevated    Transfers Overall transfer level: Needs assistance   Transfers: Sit to/from Stand Sit to Stand: Min assist, +2 physical assistance           General transfer comment: Cuing re: safe hand placement (push to standing with 1UE) as well as BLE placement to alleviate weight bearing/pain through RLE; pt able to transfer STS with min assist +2 with RW    Ambulation/Gait Ambulation/Gait assistance: Min guard, Supervision Gait Distance (Feet): 150 Feet Assistive device: Rolling walker (2 wheels) Gait Pattern/deviations: Step-to pattern Gait velocity: significantly decreased     General Gait Details: Pt with very slow gait speed, taking standing rest breaks every few feet. Pt initially only placing toes of RLE on floor but with cuing & as pt continues ambulating he is able to place RLE on floor, even progressing to increased heel strike RLE. Requires ongoing cuing for upright posture & forward vs downward gaze.   Stairs  Wheelchair Mobility    Modified Rankin (Stroke Patients Only)       Balance Overall balance assessment: Needs assistance Sitting-balance support: No upper extremity supported, Feet supported Sitting  balance-Leahy Scale: Fair Sitting balance - Comments: supervision sitting EOB   Standing balance support: No upper extremity supported Standing balance-Leahy Scale: Fair Standing balance comment: Pt able to demonstrate static standing without BUE support with close supervision<>CGA, requires BUE support on RW for gait with supervision.                            Cognition Arousal/Alertness: Awake/alert Behavior During Therapy: WFL for tasks assessed/performed Overall Cognitive Status: Within Functional Limits for tasks assessed                                          Exercises Other Exercises Other Exercises: PT educated pt on importance of ongoing mobility & ambulating with nursing staff & mobility specialists, as well as benefits of seperate PT & OT sessions. Other Exercises: PT educated pt on importance of gentle AROM while in room, encouraging pt to flex/extend knee throughout the day.    General Comments General comments (skin integrity, edema, etc.): Max HR observed 145 bpm (occurred when pt initially sat EOB) but quickly decreased.      Pertinent Vitals/Pain Pain Assessment Pain Assessment: 0-10 Pain Score:  (pt reports 8/10 at beginning of session, decreases to 6/10 by end of session) Pain Descriptors / Indicators: Discomfort, Grimacing Pain Intervention(s): Monitored during session, Premedicated before session    Home Living                          Prior Function            PT Goals (current goals can now be found in the care plan section) Acute Rehab PT Goals Patient Stated Goal: to get better PT Goal Formulation: With patient Time For Goal Achievement: 01/30/22 Potential to Achieve Goals: Good Progress towards PT goals: Progressing toward goals    Frequency    Min 3X/week      PT Plan Current plan remains appropriate    Co-evaluation PT/OT/SLP Co-Evaluation/Treatment: Yes Reason for Co-Treatment: Other  (comment) (unsure pt will tolerate/participate in 2 seperate sessions) PT goals addressed during session: Mobility/safety with mobility;Balance;Proper use of DME        AM-PAC PT "6 Clicks" Mobility   Outcome Measure  Help needed turning from your back to your side while in a flat bed without using bedrails?: A Little Help needed moving from lying on your back to sitting on the side of a flat bed without using bedrails?: A Little Help needed moving to and from a bed to a chair (including a wheelchair)?: A Little Help needed standing up from a chair using your arms (e.g., wheelchair or bedside chair)?: A Little Help needed to walk in hospital room?: A Little Help needed climbing 3-5 steps with a railing? : A Lot 6 Click Score: 17    End of Session Equipment Utilized During Treatment: Gait belt Activity Tolerance: Patient tolerated treatment well;Patient limited by pain Patient left:  (in handoff to OT, walking in room) Nurse Communication: Mobility status PT Visit Diagnosis: Pain;Other abnormalities of gait and mobility (R26.89);Difficulty in walking, not elsewhere classified (R26.2) Pain - Right/Left: Right Pain - part of  body: Knee     Time: [id number] PT Time Calculation (min) (ACUTE ONLY): 34 min  Charges:  $Gait Training: 8-22 mins                     Lavone Nian, PT, DPT 01/19/22, 12:09 PM   Waunita Schooner 01/19/2022, 12:07 PM

## 2022-01-19 NOTE — Progress Notes (Signed)
PROGRESS NOTE    [name]  [id number] DOB: 07/31/1960 DOA: 01/14/2022 PCP: [name], MD  Chief Complaint  Patient presents with   Hypotension    Brief Narrative:  [name] is [name] 61 y.o. male with [name] history of severe AS, HFpEF, AFib on eliquis, stage IIIa CKD, HTN, HLD, OSA not on CPAP who presented for the 2nd time in 2 days on 10/13 with increasing right knee pain. He had been admitted 9/30 with group [name] streptococcal bacteremia, ARF and RLE cellulitis as well as suspected gout of the right knee treated with steroid injection. With negative echo, penicillin and linezolid were given with improvement, transitioned to amoxicillin for 14 days per ID and discharged 10/5. He returned ultimately 10/13 with worsening pain and lethargy. He was septic with hypotension responsive to resuscitation, leukocytosis (WBC 14), AKI (SCr 2.4 from 1.5). IV antibiotics given, cultures drawn, and ultimately MR right knee revealed mod-large complex effusion, synovial thickening, lateral femoral condyle focal erosion, minimal adjacent marrow edema, extensive muscular edema, quadriceps tendinosis with partial-thickness tear. 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   Assessment & Plan:   Principal Problem:   Severe sepsis with septic shock (HCC)   Assessment and Plan: Severe sepsis with AKI  Recent Group [name] Bacteremia and Right Leg Cellulitis  Presumed Septic Arthritis of R Knee: Don't believe septic shock is correct since hypotension was responsive to IV fluids.  - With recent history of S. pyogenes bacteremia, plan was oral amoxicillin 1g q8h through 10/15. Will request ID input regarding this.  - appreciate ID recs, recommending treatment for presumed septic arthritis of R knee -> PICC, IV penicillin - Monitor blood cultures, NGTD. - Pt to continue coverage inclusive of group [name] Small per recent bacteremia.    Right knee pain  concerning for tophaceous gout flare vs. septic arthritis with adjoining myositis:  - s/p arthrotomy R knee with closure over drain and application of biologic graft using kerecis to the right leg per orthopedics 10/16. Negative gram stain, culture NGTD, operative findings said to be consistent with tophaceous gout.  - Adjustments to pain control, change to oxycodone as he tolerated this better in the past. Also can give IV fentanyl (least histaminergic) prn.    AKI on stage IIIa CKD:  - Avoid nephrotoxins.    Persistent AFib with RVR: Rate stabilized - Continue coreg, cardizem - hold anticoagulation given continued bleeding, defer to ortho for resumption    HTN, chronic HFpEF, aortic stenosis: - Coreg 25mg  po BID, irbesartan 30mg  daily, diltiazem 30mg  q6h. Some medications held due to initial hypotension, will restart now that BPs rising. D/w [name], restart clonidine to avoid rebound.  - BNP down from prior, required IVF resuscitation, will avoid hypotension and monitor I/O, volume status closely  - Follow up with [name] after all infection has been treated to pursue AVR. Also discussing MAZE.    CAD: No angina. Plan per cardiology.    HLD:  - Resume statin   OSA:  - Nocturnal O2, pt not on CPAP  Holding aspirin for now     DVT prophylaxis: eliquis Code Status: full Family Communication: family at bedside Disposition:   Status is: Inpatient Remains inpatient appropriate because: need for continued inpatient care   Consultants:  ID Cardiology orthpedics  Procedures:    Antimicrobials:  Anti-infectives (From admission, onward)    Start     Dose/Rate Route Frequency Ordered Stop  01/19/22 2200  penicillin G potassium 12 Million Units in dextrose 5 % 500 mL continuous infusion        12 Million Units 41.7 mL/hr over 12 Hours Intravenous Every 12 hours 01/19/22 1151     01/16/22 1400  vancomycin (VANCOCIN) IVPB 1000 mg/200 mL premix  Status:  Discontinued         1,000 mg 200 mL/hr over 60 Minutes Intravenous Every 24 hours 01/16/22 1219 01/19/22 1151   01/15/22 1200  vancomycin (VANCOREADY) IVPB 1500 mg/300 mL        1,500 mg 150 mL/hr over 120 Minutes Intravenous  Once 01/15/22 0157 01/15/22 1353   01/15/22 1000  ceFEPIme (MAXIPIME) 2 g in sodium chloride 0.9 % 100 mL IVPB  Status:  Discontinued        2 g 200 mL/hr over 30 Minutes Intravenous Every 12 hours 01/15/22 0138 01/19/22 1151   01/15/22 0157  vancomycin variable dose per unstable renal function (pharmacist dosing)  Status:  Discontinued         Does not apply See admin instructions 01/15/22 0157 01/16/22 1220   01/14/22 2200  ceFEPIme (MAXIPIME) 2 g in sodium chloride 0.9 % 100 mL IVPB        2 g 200 mL/hr over 30 Minutes Intravenous  Once 01/14/22 2149 01/14/22 2311   01/14/22 2200  linezolid (ZYVOX) IVPB 600 mg        600 mg 300 mL/hr over 60 Minutes Intravenous Once 01/14/22 2149 01/15/22 0249       Subjective: No new complaints Sig other notes recent blood from wound after vac removed  Objective: Vitals:   01/18/22 2120 01/19/22 0518 01/19/22 0553 01/19/22 1522  BP: 126/81 (!) 150/89  (!) 188/105  Pulse: 75 64  68  Resp:   14 14  Temp: 98.1 F (36.7 C) 97.8 F (36.6 C)    TempSrc: Oral Oral    SpO2:    98%  Weight:   94.8 kg   Height:        Intake/Output Summary (Last 24 hours) at 01/19/2022 1856 Last data filed at 01/19/2022 0520 Gross per 24 hour  Intake 1085.24 ml  Output --  Net 1085.24 ml   Filed Weights   01/15/22 0130 01/18/22 0529 01/19/22 0553  Weight: 94.3 kg 92.8 kg 94.8 kg    Examination:  General exam: Appears calm and comfortable  Respiratory system: unlabored Cardiovascular system: irregularly irregular Gastrointestinal system: Abdomen is nondistended, soft and nontender.  Central nervous system: Alert and oriented. No focal neurological deficits. Extremities: dressing to R knee, no visible blood     Data Reviewed: I have  personally reviewed following labs and imaging studies  CBC: Recent Labs  Lab 01/14/22 2128 01/14/22 2219 01/15/22 0450 01/16/22 0253 01/17/22 0156 01/18/22 0147 01/19/22 0211  WBC 14.2*  --  11.8* 8.1 6.6 5.9 6.3  NEUTROABS 10.7*  --   --  5.5 4.6 4.5 4.2  HGB 9.8*   < > 9.9* 9.2* 8.6* 8.3* 7.9*  HCT 29.2*   < > 30.1* 26.4* 25.9* 24.8* 24.1*  MCV 72.3*  --  72.2* 70.0* 70.8* 71.5* 70.9*  PLT 225  --  222 201 179 173 187   < > = values in this interval not displayed.    Basic Metabolic Panel: Recent Labs  Lab 01/14/22 2128 01/14/22 2219 01/15/22 0450 01/16/22 0253 01/17/22 0156 01/18/22 0147 01/19/22 0211  NA 134*   < > 133* 137 135 135 139  K 3.6   < > 4.0 3.8 4.0 4.6 4.0  CL 103   < > 105 108 107 109 111  CO2 21*  --  19* 20* 21* 19* 21*  GLUCOSE 119*   < > 113* 96 98 140* 165*  BUN 27*   < > 26* 22 17 20  24*  CREATININE 2.40*   < > 2.00* 1.62* 1.48* 1.36* 1.46*  CALCIUM 8.7*  --  8.5* 8.9 9.0 8.7* 8.9  MG 1.7  --   --  1.9 1.8 2.1 2.1  PHOS  --   --   --  3.1  --   --   --    < > = values in this interval not displayed.    GFR: Estimated Creatinine Clearance: 59.6 mL/min (Isma Tietje) (by C-G formula based on SCr of 1.46 mg/dL (H)).  Liver Function Tests: Recent Labs  Lab 01/15/22 0450 01/16/22 0253 01/17/22 0156 01/18/22 0147 01/19/22 0211  AST 13* 12* 11* 16 18  ALT 13 13 12 14 18   ALKPHOS 53 49 57 57 61  BILITOT 0.8 0.9 0.8 0.7 0.6  PROT 6.6 6.7 6.7 6.7 6.4*  ALBUMIN 2.5* 2.3* 2.3* 2.2* 2.1*    CBG: Recent Labs  Lab 01/14/22 2140  GLUCAP 152*     Recent Results (from the past 240 hour(s))  Blood Culture (routine x 2)     Status: None   Collection Time: 01/14/22 10:11 PM   Specimen: BLOOD  Result Value Ref Range Status   Specimen Description BLOOD BLOOD RIGHT ARM  Final   Special Requests   Final    BOTTLES DRAWN AEROBIC AND ANAEROBIC Blood Culture results may not be optimal due to an inadequate volume of blood received in culture bottles    Culture   Final    NO GROWTH 5 DAYS Performed at [facility], [place] [address] [place], [place] [address]    Report Status 01/19/2022 FINAL  Final  Body fluid culture w Gram Stain     Status: None   Collection Time: 01/15/22  3:08 PM   Specimen: Body Fluid  Result Value Ref Range Status   Specimen Description FLUID  Final   Special Requests RIGHT KNEE ASPIRATION  Final   Gram Stain   Final    MODERATE WBC PRESENT, PREDOMINANTLY PMN NO ORGANISMS SEEN    Culture   Final    NO GROWTH 3 DAYS Performed at [facility], [address] [place], [place] [address]    Report Status 01/19/2022 FINAL  Final  Respiratory (~20 pathogens) panel by PCR     Status: None   Collection Time: 01/16/22 12:03 AM   Specimen: Nasopharyngeal Swab; Respiratory  Result Value Ref Range Status   Adenovirus NOT DETECTED NOT DETECTED Final   Coronavirus 229E NOT DETECTED NOT DETECTED Final    Comment: (NOTE) The Coronavirus on the Respiratory Panel, DOES NOT test for the novel  Coronavirus (2019 nCoV)    Coronavirus HKU1 NOT DETECTED NOT DETECTED Final   Coronavirus NL63 NOT DETECTED NOT DETECTED Final   Coronavirus OC43 NOT DETECTED NOT DETECTED Final   Metapneumovirus NOT DETECTED NOT DETECTED Final   Rhinovirus / Enterovirus NOT DETECTED NOT DETECTED Final   Influenza Samyak Sackmann NOT DETECTED NOT DETECTED Final   Influenza B NOT DETECTED NOT DETECTED Final   Parainfluenza Virus 1 NOT DETECTED NOT DETECTED Final   Parainfluenza Virus 2 NOT DETECTED NOT DETECTED Final   Parainfluenza Virus 3 NOT  DETECTED NOT DETECTED Final   Parainfluenza Virus 4 NOT DETECTED NOT DETECTED Final   Respiratory Syncytial Virus NOT DETECTED NOT DETECTED Final   Bordetella pertussis NOT DETECTED NOT DETECTED Final   Bordetella Parapertussis NOT DETECTED NOT DETECTED Final   Chlamydophila pneumoniae NOT DETECTED NOT DETECTED Final   Mycoplasma pneumoniae NOT DETECTED NOT DETECTED Final    Comment: Performed at [facility], [place] [address], [place], [place] [address]  Aerobic/Anaerobic Culture w Gram Stain (surgical/deep wound)     Status: None (Preliminary result)   Collection Time: 01/17/22  9:18 AM   Specimen: Synovial, Right Knee; Body Fluid  Result Value Ref Range Status   Specimen Description KNEE  Final   Special Requests  RIGHT, JOINT FLUID  Final   Gram Stain   Final    MODERATE WBC PRESENT,BOTH PMN AND MONONUCLEAR NO ORGANISMS SEEN    Culture   Final    CULTURE REINCUBATED FOR BETTER GROWTH Performed at [facility], [address] [place], [place] [address]    Report Status PENDING  Incomplete  Aerobic/Anaerobic Culture w Gram Stain (surgical/deep wound)     Status: None (Preliminary result)   Collection Time: 01/17/22  9:21 AM   Specimen: Synovial, Right Knee; Tissue  Result Value Ref Range Status   Specimen Description TISSUE  Final   Special Requests  RIGHT KNEE  Final   Gram Stain   Final    FEW WBC PRESENT, PREDOMINANTLY PMN NO ORGANISMS SEEN    Culture   Final    NO GROWTH 2 DAYS Performed at [facility], [place] [address], [place], [place] [address]    Report Status PENDING  Incomplete         Radiology Studies: [place] EKG SITE RITE  Result Date: 01/19/2022 If Site Rite image not attached, placement could not be confirmed due to current cardiac rhythm.       Scheduled Meds:  apixaban  5 mg Oral BID   aspirin EC  81 mg Oral Daily   atorvastatin  20 mg Oral Daily   carvedilol  25 mg Oral BID WC   cloNIDine  0.2 mg Oral TID   ferrous sulfate  325 mg Oral QODAY   hydrALAZINE  25 mg Oral Q8H   irbesartan  300 mg Oral Daily   Continuous Infusions:  sodium chloride 10 mL/hr at 01/19/22 0042   penicillin G potassium 12 Million Units in dextrose 5 % 500 mL continuous infusion       LOS: 4 days    Time spent: over 30 min    Fayrene Helper, MD [facility]   To contact the attending provider between 7A-7P or the covering provider  during after hours 7P-7A, please log into the web site [email or web address] and access using universal [place] password for that web site. If you do not have the password, please call the hospital operator.  01/19/2022, 6:56 PM

## 2022-01-19 NOTE — Progress Notes (Addendum)
Rounding Note    Patient Name: [name] Date of Encounter: 01/19/2022  [place] Cardiologist: Donato Heinz, MD   Subjective   Feeling well. No chest pain, sob or palpitations.    Inpatient Medications    Scheduled Meds:  apixaban  5 mg Oral BID   aspirin EC  81 mg Oral Daily   atorvastatin  20 mg Oral Daily   carvedilol  25 mg Oral BID WC   cloNIDine  0.2 mg Oral TID   diltiazem  120 mg Oral Daily   ferrous sulfate  325 mg Oral QODAY   irbesartan  300 mg Oral Daily   Continuous Infusions:  sodium chloride 10 mL/hr at 01/19/22 0042   ceFEPime (MAXIPIME) IV 2 g (01/19/22 0923)   vancomycin Stopped (01/18/22 1552)   PRN Meds: sodium chloride, acetaminophen **OR** acetaminophen, albuterol, fentaNYL (SUBLIMAZE) injection, hydrOXYzine, labetalol, ondansetron **OR** ondansetron (ZOFRAN) IV, oxyCODONE   Vital Signs    Vitals:   01/18/22 1959 01/18/22 2120 01/19/22 0518 01/19/22 0553  BP: (!) 122/97 126/81 (!) 150/89   Pulse: 76 75 64   Resp: 18   14  Temp:  98.1 F (36.7 C) 97.8 F (36.6 C)   TempSrc:  Oral Oral   SpO2: 96%     Weight:    94.8 kg  Height:        Intake/Output Summary (Last 24 hours) at 01/19/2022 1004 Last data filed at 01/19/2022 0520 Gross per 24 hour  Intake 1085.24 ml  Output 550 ml  Net 535.24 ml      01/19/2022    5:53 AM 01/18/2022    5:29 AM 01/15/2022    1:30 AM  Last 3 Weights  Weight (lbs) 208 lb 15.9 oz 204 lb 9.4 oz 207 lb 14.3 oz  Weight (kg) 94.8 kg 92.8 kg 94.3 kg      Telemetry    Afib with pause  - Personally Reviewed  ECG    N/A  Physical Exam   GEN: No acute distress.   Neck: No JVD Cardiac:Ir Ir, 3/6 systolic  murmurs, rubs, or gallops.  Respiratory: Clear to auscultation bilaterally. GI: Soft, nontender, non-distended  MS: No edema; No deformity.R leg warp with drain  Neuro:  Nonfocal  Psych: Normal affect   Labs    High Sensitivity Troponin:   Recent Labs  Lab  01/13/22 0424 01/14/22 2128 01/15/22 0150  TROPONINIHS 16 29* 29*     Chemistry Recent Labs  Lab 01/17/22 0156 01/18/22 0147 01/19/22 0211  NA 135 135 139  K 4.0 4.6 4.0  CL 107 109 111  CO2 21* 19* 21*  GLUCOSE 98 140* 165*  BUN 17 20 24*  CREATININE 1.48* 1.36* 1.46*  CALCIUM 9.0 8.7* 8.9  MG 1.8 2.1 2.1  PROT 6.7 6.7 6.4*  ALBUMIN 2.3* 2.2* 2.1*  AST 11* 16 18  ALT 12 14 18   ALKPHOS 57 57 61  BILITOT 0.8 0.7 0.6  GFRNONAA 53* 59* 54*  ANIONGAP 7 7 7     Lipids No results for input(s): "CHOL", "TRIG", "HDL", "LABVLDL", "LDLCALC", "CHOLHDL" in the last 168 hours.  Hematology Recent Labs  Lab 01/17/22 0156 01/18/22 0147 01/19/22 0211  WBC 6.6 5.9 6.3  RBC 3.66* 3.47* 3.40*  HGB 8.6* 8.3* 7.9*  HCT 25.9* 24.8* 24.1*  MCV 70.8* 71.5* 70.9*  MCH 23.5* 23.9* 23.2*  MCHC 33.2 33.5 32.8  RDW 15.4 15.9* 15.9*  PLT 179 173 187   Thyroid No results  for input(s): "TSH", "FREET4" in the last 168 hours.  BNP Recent Labs  Lab 01/17/22 0156 01/18/22 0147 01/19/22 0211  BNP 546.3* 669.1* 478.3*    DDimer No results for input(s): "DDIMER" in the last 168 hours.   Radiology    No results found.  Cardiac Studies   N/A  Patient Profile     61 y.o. male with history of known severe aortic stenosis and CAD (waiting CABG and AVR), chronic diastolic congestive heart failure, peripheral arterial disease, LVH, persistent atrial fibrillation, HLD and resistant hypertension who admitted 9/30-9/5 with sepsis group A step bacteremia due to cellulitis of legs, septic arthritis of Rt swollen knee who presented 10/13 with worsening pain and lethargy. Admitte for sepsis with AKI and hypotension. Improved with hydration. Found to have right knee septic arthritis with adjoining myositis s/p I  & D yesterday.  Cardiology is asked for elevated heart rate.  Assessment & Plan    1.  Atrial fibrillation with rapid ventricular heart rate -Elevated heart rate in setting of septic  arthritis.  Treated with IV Cardizem initially with improved heart rate. -Eliquis held initially and then resume -Heart rate currently stable -Continue carvedilol 25 mg twice daily  - Will discontinue diltiazem   2.  CAD  3.  Severe aortic stenosis - planning AVR once recovers from infection - No CP or dyspnea   4.  Hypertension - BP now improving after restarting clonidine  -Continue carvedilol 25 mg twice daily and irbesartan 30 mg daily  - Continue clonidine 0.2 mg TID   -Restart hydralazine at 25 mg 3 times daily  For questions or updates, please contact [place] Please consult [email or web address] for contact info under        SignedLeanor Kail, PA  01/19/2022, 10:04 AM     Patient seen and examined.  Agree with above documentation.  On exam, patient is alert and oriented, regular rate and rhythm, 3/6 systolic murmur, lungs CTAB, no LE edema.  BP improved, continue current regimen.  Rates well controlled in Afib, having some 2-3 second pauses.  Will discontinue diltiazem.  Add back hydralazine 25 mg TID  Donato Heinz, MD

## 2022-01-19 NOTE — Telephone Encounter (Signed)
He has an upcoming appointment with Lazaro Arms in 1 week.

## 2022-01-19 NOTE — Progress Notes (Signed)
Occupational Therapy Treatment Patient Details Name: [name] MRN: [id number] DOB: Apr 11, 1960 Today's Date: 01/19/2022   History of present illness Pt is a 61 y.o. male who presented 01/14/22 with progressive pain in R knee and weakness. S/p R knee aspiration 10/14. Patient recently hospitalized 01/01/2022 through 01/06/2022 with diagnosis of sepsis due to group A streptococcus with acute renal failure in the setting of lower extremity cellulitis and swollen knee.  At the time septic arthritis was ruled out. S/p I & D to R knee on 10/16. 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 PMH: HTN, CHF, HLD, gout, CKD, afib, PAD, aortic stenosis, cellulitis, chronic back pain.   OT comments  Pt progressing towards goals this session, able to complete UB dressing and seated grooming task with set up-min guard A, pt needing min A for bed mobility with significantly increased time due to pain. Pt min A +2 for transfers and hallway distance ambulation using RW. VSS throughout, pt reporting stomach "feeling weird" at end of session, RN notified and in room. Pt presenting with impairments listed below, will follow acutely. Continue to recommend HHOT at d/c.   Recommendations for follow up therapy are one component of a multi-disciplinary discharge planning process, led by the attending physician.  Recommendations may be updated based on patient status, additional functional criteria and insurance authorization.    Follow Up Recommendations  Home health OT    Assistance Recommended at Discharge Frequent or constant Supervision/Assistance  Patient can return home with the following  A lot of help with bathing/dressing/bathroom;Assistance with cooking/housework;Assist for transportation;Help with stairs or ramp for entrance;A lot of help with walking and/or transfers   Equipment Recommendations  BSC/3in1    Recommendations for Other Services PT consult    Precautions / Restrictions Precautions Precautions:  Fall Precaution Comments: R leg hypersensitive Restrictions Weight Bearing Restrictions: Yes RLE Weight Bearing: Weight bearing as tolerated Other Position/Activity Restrictions: WBAT and ROM as tolerated to R leg       Mobility Bed Mobility Overal bed mobility: Needs Assistance Bed Mobility: Supine to Sit     Supine to sit: Min assist          Transfers Overall transfer level: Needs assistance   Transfers: Sit to/from Stand Sit to Stand: Min assist, +2 physical assistance                 Balance Overall balance assessment: Needs assistance Sitting-balance support: No upper extremity supported, Feet supported Sitting balance-Leahy Scale: Fair Sitting balance - Comments: supervision sitting EOB   Standing balance support: No upper extremity supported Standing balance-Leahy Scale: Fair Standing balance comment: Pt able to demonstrate static standing without BUE support with close supervision<>CGA, requires BUE support on RW for gait with supervision.                           ADL either performed or assessed with clinical judgement   ADL Overall ADL's : Needs assistance/impaired     Grooming: Set up;Sitting           Upper Body Dressing : Min guard;Sitting       Toilet Transfer: Rolling walker (2 wheels);Minimal assistance;+2 for safety/equipment           Functional mobility during ADLs: Min guard;Rolling walker (2 wheels)      Extremity/Trunk Assessment Upper Extremity Assessment Upper Extremity Assessment: Overall WFL for tasks assessed   Lower Extremity Assessment Lower Extremity Assessment: Defer to PT evaluation  Vision   Vision Assessment?: No apparent visual deficits   Perception Perception Perception: Not tested   Praxis Praxis Praxis: Not tested    Cognition Arousal/Alertness: Awake/alert Behavior During Therapy: WFL for tasks assessed/performed Overall Cognitive Status: Within Functional Limits for  tasks assessed                                 General Comments: slow to follow commands        Exercises      Shoulder Instructions       General Comments VSS on RA    Pertinent Vitals/ Pain       Pain Assessment Pain Assessment: Faces Pain Score: 7  Faces Pain Scale: Hurts whole lot Pain Location: R knee Pain Descriptors / Indicators: Discomfort, Grimacing Pain Intervention(s): Limited activity within patient's tolerance, Monitored during session, Repositioned, Premedicated before session  Home Living                                          Prior Functioning/Environment              Frequency  Min 2X/week        Progress Toward Goals  OT Goals(current goals can now be found in the care plan section)  Progress towards OT goals: Progressing toward goals  Acute Rehab OT Goals Patient Stated Goal: decrease pain OT Goal Formulation: With patient Time For Goal Achievement: 01/31/22 Potential to Achieve Goals: Good ADL Goals Pt Will Perform Upper Body Dressing: with supervision;sitting Pt Will Perform Lower Body Dressing: with min assist;sit to/from stand;sitting/lateral leans Pt Will Transfer to Toilet: with min assist;ambulating;bedside commode  Plan Discharge plan remains appropriate    Co-evaluation      Reason for Co-Treatment: Other (comment) (unsure pt will tolerate/participate in 2 seperate sessions) PT goals addressed during session: Mobility/safety with mobility;Balance;Proper use of DME        AM-PAC OT "6 Clicks" Daily Activity     Outcome Measure   Help from another person eating meals?: None Help from another person taking care of personal grooming?: A Little Help from another person toileting, which includes using toliet, bedpan, or urinal?: A Lot Help from another person bathing (including washing, rinsing, drying)?: A Lot Help from another person to put on and taking off regular upper body  clothing?: A Little Help from another person to put on and taking off regular lower body clothing?: A Lot 6 Click Score: 16    End of Session Equipment Utilized During Treatment: Gait belt;Rolling walker (2 wheels)  OT Visit Diagnosis: Pain Pain - Right/Left: Right Pain - part of body: Knee   Activity Tolerance Patient tolerated treatment well   Patient Left in chair;with call bell/phone within reach;with chair alarm set;with nursing/sitter in room   Nurse Communication Mobility status        Time: 7169-6789 OT Time Calculation (min): 43 min  Charges: OT General Charges $OT Visit: 1 Visit OT Treatments $Self Care/Home Management : 8-22 mins $Therapeutic Activity: 8-22 mins  Alfonzo Beers, OTD, OTR/L Acute Rehab [phone]   [name] 01/19/2022, 2:52 PM

## 2022-01-20 ENCOUNTER — Inpatient Hospital Stay (HOSPITAL_COMMUNITY): Payer: Medicare Other

## 2022-01-20 DIAGNOSIS — I4819 Other persistent atrial fibrillation: Secondary | ICD-10-CM | POA: Diagnosis not present

## 2022-01-20 DIAGNOSIS — R6521 Severe sepsis with septic shock: Secondary | ICD-10-CM | POA: Diagnosis not present

## 2022-01-20 DIAGNOSIS — A419 Sepsis, unspecified organism: Secondary | ICD-10-CM | POA: Diagnosis not present

## 2022-01-20 DIAGNOSIS — I1A0 Resistant hypertension: Secondary | ICD-10-CM | POA: Diagnosis not present

## 2022-01-20 DIAGNOSIS — I350 Nonrheumatic aortic (valve) stenosis: Secondary | ICD-10-CM | POA: Diagnosis not present

## 2022-01-20 LAB — CBC WITH DIFFERENTIAL/PLATELET
Abs Immature Granulocytes: 0.03 10*3/uL (ref 0.00–0.07)
Basophils Absolute: 0.1 10*3/uL (ref 0.0–0.1)
Basophils Relative: 1 %
Eosinophils Absolute: 0.2 10*3/uL (ref 0.0–0.5)
Eosinophils Relative: 3 %
HCT: 23.1 % — ABNORMAL LOW (ref 39.0–52.0)
Hemoglobin: 7.8 g/dL — ABNORMAL LOW (ref 13.0–17.0)
Immature Granulocytes: 0 %
Lymphocytes Relative: 28 %
Lymphs Abs: 2 10*3/uL (ref 0.7–4.0)
MCH: 23.7 pg — ABNORMAL LOW (ref 26.0–34.0)
MCHC: 33.8 g/dL (ref 30.0–36.0)
MCV: 70.2 fL — ABNORMAL LOW (ref 80.0–100.0)
Monocytes Absolute: 0.6 10*3/uL (ref 0.1–1.0)
Monocytes Relative: 9 %
Neutro Abs: 4.1 10*3/uL (ref 1.7–7.7)
Neutrophils Relative %: 59 %
Platelets: 196 10*3/uL (ref 150–400)
RBC: 3.29 MIL/uL — ABNORMAL LOW (ref 4.22–5.81)
RDW: 16 % — ABNORMAL HIGH (ref 11.5–15.5)
WBC: 7 10*3/uL (ref 4.0–10.5)
nRBC: 0.4 % — ABNORMAL HIGH (ref 0.0–0.2)

## 2022-01-20 LAB — MAGNESIUM: Magnesium: 1.7 mg/dL (ref 1.7–2.4)

## 2022-01-20 LAB — COMPREHENSIVE METABOLIC PANEL
ALT: 17 U/L (ref 0–44)
AST: 15 U/L (ref 15–41)
Albumin: 2.2 g/dL — ABNORMAL LOW (ref 3.5–5.0)
Alkaline Phosphatase: 57 U/L (ref 38–126)
Anion gap: 5 (ref 5–15)
BUN: 20 mg/dL (ref 8–23)
CO2: 21 mmol/L — ABNORMAL LOW (ref 22–32)
Calcium: 8.6 mg/dL — ABNORMAL LOW (ref 8.9–10.3)
Chloride: 113 mmol/L — ABNORMAL HIGH (ref 98–111)
Creatinine, Ser: 1.34 mg/dL — ABNORMAL HIGH (ref 0.61–1.24)
GFR, Estimated: 60 mL/min — ABNORMAL LOW (ref >60)
Glucose, Bld: 106 mg/dL — ABNORMAL HIGH (ref 70–99)
Potassium: 4.2 mmol/L (ref 3.5–5.1)
Sodium: 139 mmol/L (ref 135–145)
Total Bilirubin: 0.6 mg/dL (ref 0.3–1.2)
Total Protein: 6.2 g/dL — ABNORMAL LOW (ref 6.5–8.1)

## 2022-01-20 LAB — PHOSPHORUS: Phosphorus: 2.4 mg/dL — ABNORMAL LOW (ref 2.5–4.6)

## 2022-01-20 LAB — PROCALCITONIN: Procalcitonin: 0.34 ng/mL

## 2022-01-20 LAB — C-REACTIVE PROTEIN: CRP: 3.4 mg/dL — ABNORMAL HIGH (ref <1.0)

## 2022-01-20 LAB — SEDIMENTATION RATE: Sed Rate: 51 mm/hr — ABNORMAL HIGH (ref 0–16)

## 2022-01-20 MED ORDER — SODIUM CHLORIDE 0.9% FLUSH: 10.0000 mL | INTRAVENOUS | Status: DC | PRN

## 2022-01-20 MED ORDER — PENICILLIN G POTASSIUM IV (FOR PTA / DISCHARGE USE ONLY): 24.0000 10*6.[IU] | INTRAVENOUS | 0 refills | Status: DC

## 2022-01-20 MED ORDER — HYDRALAZINE HCL 50 MG PO TABS
50.0000 mg | ORAL_TABLET | Freq: Three times a day (TID) | ORAL | Status: DC
  Administered 2022-01-20 – 2022-01-24 (×12): 50 mg via ORAL
  Filled 2022-01-20 (×13): qty 1

## 2022-01-20 MED ORDER — SODIUM CHLORIDE 0.9% FLUSH
10.0000 mL | Freq: Two times a day (BID) | INTRAVENOUS | Status: DC
  Administered 2022-01-20 – 2022-01-25 (×10): 10 mL

## 2022-01-20 MED ORDER — CHLORHEXIDINE GLUCONATE CLOTH 2 % EX PADS
6.0000 | MEDICATED_PAD | Freq: Every day | CUTANEOUS | Status: DC
  Administered 2022-01-20 – 2022-01-25 (×6): 6 via TOPICAL

## 2022-01-20 MED ORDER — DAPAGLIFLOZIN PROPANEDIOL 10 MG PO TABS
10.0000 mg | ORAL_TABLET | Freq: Every day | ORAL | Status: DC
  Administered 2022-01-20 – 2022-01-25 (×6): 10 mg via ORAL
  Filled 2022-01-20 (×6): qty 1

## 2022-01-20 NOTE — Progress Notes (Signed)
Peripherally Inserted Central Catheter Placement  The IV Nurse has discussed with the patient and/or persons authorized to consent for the patient, the purpose of this procedure and the potential benefits and risks involved with this procedure.  The benefits include less needle sticks, lab draws from the catheter, and the patient may be discharged home with the catheter. Risks include, but not limited to, infection, bleeding, blood clot (thrombus formation), and puncture of an artery; nerve damage and irregular heartbeat and possibility to perform a PICC exchange if needed/ordered by physician.  Alternatives to this procedure were also discussed.  Bard Power PICC patient education guide, fact sheet on infection prevention and patient information card has been provided to patient /or left at bedside.    PICC Placement Documentation  PICC Single Lumen 63/33/54 Right Basilic 40 cm 0 cm (Active)  Indication for Insertion or Continuance of Line Home intravenous therapies (PICC only) 01/20/22 1000  Exposed Catheter (cm) 0 cm 01/20/22 1000  Site Assessment Clean, Dry, Intact 01/20/22 1000  Line Status Flushed;Saline locked;Blood return noted 01/20/22 1000  Dressing Type Transparent;Securing device 01/20/22 1000  Dressing Status Antimicrobial disc in place 01/20/22 1000  Safety Lock Not Applicable 56/25/63 8937  Line Care Connections checked and tightened 01/20/22 1000  Dressing Intervention New dressing 01/20/22 1000  Dressing Change Due 01/27/22 01/20/22 [place], [place] 01/20/2022, 10:35 AM

## 2022-01-20 NOTE — Progress Notes (Signed)
PT Cancellation Note  Patient Details Name: [name] MRN: [id number] DOB: 01-Nov-1960   Cancelled Treatment:    Reason Eval/Treat Not Completed: Patient at procedure or test/unavailable. Pt undergoing sterile procedure. Will check back later.   [place] 01/20/2022, 9:46 AM [place] Office [phone]

## 2022-01-20 NOTE — TOC Progression Note (Signed)
Transition of Care [facility]) - Progression Note    Patient Details  Name: [name] MRN: [id number] Date of Birth: 1960-11-04  Transition of Care [facility]) CM/SW Contact  Graves-Bigelow, Ocie Cornfield, RN Phone Number: 01/20/2022, 3:41 PM  Clinical Narrative: Case Manager spoke with patient and significant other on yesterday regarding home health services. Case Manager provided Medicare.gov list and family wanted an agency in network. Case Manager did call [place] and they can service the patient with RN service for IV antibiotics and PT. [facility] will complete the education for IV antibiotic therapy. Case Manager will continue to follow for additional transition of care needs.     Expected Discharge Plan: [place] Barriers to Discharge: Continued Medical Work up  Expected Discharge Plan and Services Expected Discharge Plan: [place]   Discharge Planning Services: CM Consult Post Acute Care Choice: [place] arrangements for the past 2 months: Single Family Home                 DME Arranged: IV pump/equipment Engineer, manufacturing systems)         HH Arranged: RN, Disease Management, PT, IV Antibiotics HH Agency: [place] Date [place]: 01/20/22 Time [place]: 32 Representative spoke with at [place]: Claiborne Billings  Readmission Risk Interventions    01/19/2022    3:56 PM  Readmission Risk Prevention Plan  Transportation Screening Complete  Home Care Screening Complete  Medication Review (RN CM) Complete

## 2022-01-20 NOTE — Progress Notes (Addendum)
Rounding Note    Patient Name: [name] Date of Encounter: 01/20/2022  [place] Cardiologist: Donato Heinz, MD   Subjective   He is feeling OK, denied any chest pain, heart palpitation, dizziness, syncope, SOB. He states his right leg is always swollen.   Inpatient Medications    Scheduled Meds:  atorvastatin  20 mg Oral Daily   carvedilol  25 mg Oral BID WC   cloNIDine  0.2 mg Oral TID   dapagliflozin propanediol  10 mg Oral Daily   ferrous sulfate  325 mg Oral QODAY   hydrALAZINE  50 mg Oral Q8H   irbesartan  300 mg Oral Daily   Continuous Infusions:  sodium chloride Stopped (01/19/22 1511)   penicillin G potassium 12 Million Units in dextrose 5 % 500 mL continuous infusion 41.7 mL/hr at 01/20/22 0320   PRN Meds: sodium chloride, acetaminophen **OR** acetaminophen, albuterol, fentaNYL (SUBLIMAZE) injection, hydrOXYzine, labetalol, ondansetron **OR** ondansetron (ZOFRAN) IV, oxyCODONE   Vital Signs    Vitals:   01/19/22 0553 01/19/22 1522 01/19/22 1949 01/20/22 0600  BP:  (!) 188/105  (!) 165/111  Pulse:  68 73 79  Resp: 14 14  16   Temp:   97.8 F (36.6 C) 98.1 F (36.7 C)  TempSrc:   Oral Oral  SpO2:  98%    Weight: 94.8 kg     Height:        Intake/Output Summary (Last 24 hours) at 01/20/2022 0816 Last data filed at 01/20/2022 0700 Gross per 24 hour  Intake 240 ml  Output 450 ml  Net -210 ml      01/19/2022    5:53 AM 01/18/2022    5:29 AM 01/15/2022    1:30 AM  Last 3 Weights  Weight (lbs) 208 lb 15.9 oz 204 lb 9.4 oz 207 lb 14.3 oz  Weight (kg) 94.8 kg 92.8 kg 94.3 kg      Telemetry    A fib with ventricular rate of 80s, PVCs - Personally Reviewed  ECG    N/A  - Personally Reviewed  Physical Exam   GEN: No acute distress.   Neck: No JVD Cardiac: RRR, grade III systolic murmur at RUSB, rubs, or gallops.  Respiratory: Clear to auscultation bilaterally. On room air.  GI: Soft, nontender, non-distended  MS:  Trace BLE edema; No deformity. Neuro:  Nonfocal  Psych: Normal affect   Labs    High Sensitivity Troponin:   Recent Labs  Lab 01/13/22 0424 01/14/22 2128 01/15/22 0150  TROPONINIHS 16 29* 29*     Chemistry Recent Labs  Lab 01/18/22 0147 01/19/22 0211 01/20/22 0159  NA 135 139 139  K 4.6 4.0 4.2  CL 109 111 113*  CO2 19* 21* 21*  GLUCOSE 140* 165* 106*  BUN 20 24* 20  CREATININE 1.36* 1.46* 1.34*  CALCIUM 8.7* 8.9 8.6*  MG 2.1 2.1 1.7  PROT 6.7 6.4* 6.2*  ALBUMIN 2.2* 2.1* 2.2*  AST 16 18 15   ALT 14 18 17   ALKPHOS 57 61 57  BILITOT 0.7 0.6 0.6  GFRNONAA 59* 54* 60*  ANIONGAP 7 7 5     Lipids No results for input(s): "CHOL", "TRIG", "HDL", "LABVLDL", "LDLCALC", "CHOLHDL" in the last 168 hours.  Hematology Recent Labs  Lab 01/18/22 0147 01/19/22 0211 01/20/22 0159  WBC 5.9 6.3 7.0  RBC 3.47* 3.40* 3.29*  HGB 8.3* 7.9* 7.8*  HCT 24.8* 24.1* 23.1*  MCV 71.5* 70.9* 70.2*  MCH 23.9* 23.2* 23.7*  MCHC  33.5 32.8 33.8  RDW 15.9* 15.9* 16.0*  PLT 173 187 196   Thyroid No results for input(s): "TSH", "FREET4" in the last 168 hours.  BNP Recent Labs  Lab 01/17/22 0156 01/18/22 0147 01/19/22 0211  BNP 546.3* 669.1* 478.3*    DDimer No results for input(s): "DDIMER" in the last 168 hours.   Radiology    [place] EKG SITE RITE  Result Date: 01/19/2022 If Site Rite image not attached, placement could not be confirmed due to current cardiac rhythm.   Cardiac Studies   Echo from 12/28/21:   1. Left ventricular ejection fraction, by estimation, is 60 to 65%. The  left ventricle has normal function. The left ventricle has no regional  wall motion abnormalities. There is severe concentric left ventricular  hypertrophy. Left ventricular diastolic   function could not be evaluated. Elevated left atrial pressure.   2. Right ventricular systolic function is normal. The right ventricular  size is normal. There is mildly elevated pulmonary artery systolic  pressure.    3. Left atrial size was severely dilated.   4. Right atrial size was mildly dilated.   5. The mitral valve is normal in structure. Mild mitral valve  regurgitation. No evidence of mitral stenosis.   6. The aortic valve is tricuspid. Aortic valve regurgitation is mild.  Moderate to severe aortic valve stenosis.   7. Aortic dilatation noted. There is mild dilatation of the ascending  aorta, measuring 40 mm.   8. The inferior vena cava is normal in size with greater than 50%  respiratory variability, suggesting right atrial pressure of 3 mmHg.   Patient Profile     61 y.o. male with PMH of chronic diastolic CHF, resistant HTN, moderate to severe aortic stenosis, paroxysmal atrial fib, HLD, PAD, who had recent S. pyogenes bacteremia and right leg cellulitis, is now admitted for sepsis 2/2  right septic arthritis of knee.  Cardiology is consulted for A fib RVR since 01/16/22.    Assessment & Plan    Persistent A fib with RVR, resolved  - in the setting of sepsis, continue treat underlying infection  - IV Cardizem weaned off, now on Coreg 25mg  BID, rate controlled, no pauses >2 seocnds over the past 24 hours  - CHADS2VASc 3 (CHF, HTN, PAD), please resume Eliquis for CVA prophylaxis when clear by ortho  - planned for biatrial Maze procedure during AVR   Severe aortic stenosis  - Echo 12/28/21 with EF 60-65%, no RWMA, severe concentric LVH RV is normal LA severely dilated and RA mildly dilated.  Mild MR  mod to severe AS aortic dilatation of ascending aorta 40 mm  Aortic valve mean gradient measures 29.0 mmHg. Aortic valve peak gradient measures 60.0 mmHg. Aortic valve area, by VTI  measures 0.97 cm -  He was scheduled for aortic valve replacement 07/2021, surgery delayed due to leg wound, last saw [name] 12/23/21 with plan for repeat echo, and then schedule for AVR using bioprosthetic valve and coronary bypass to the diagonal branch, biatrial Maze procedure - continue medical therapy with  coreg, avoid tachycardia and hypotension   Chronic diastolic heart failure  - on Lasix 60mg  TID, held for hypotension /sepsis, can use lasix as needed while here  - clinically euvolemic today  - GDMT: on Farxiga 10mg  daily PTA, resume today given resolved AKI   Resistant HTN  - BP is high now - on PTA Coreg 25mg  BID, clonidine 0.2mg  TID, hydralazine 50mg  TID, olmesartan 40mg  daily, lasix 60mg   TID  - will increase hydralazine from, 25mg  to 50mg  TID today for BP control  - trend BP   Sepsis due to presumed right knee septic arthritis  AKI on CKD III Anemia  HLD  OSA  - per primary team   For questions or updates, please contact [place] Please consult [email or web address] for contact info under        Signed, Margie Billet, NP  01/20/2022, 8:16 AM    Patient seen and examined.  Agree with above documentation.  On exam, patient is alert and oriented, irregular rhythm, no murmurs, lungs CTAB, no LE edema .  Telemetry reviewed, A-fib rates appear well controlled.  BP remains elevated, will increase hydralazine to 50 mg 3 times daily.  We will add back Farxiga 10 mg daily.  Eliquis currently on hold given bleeding from surgical site.  Donato Heinz, MD

## 2022-01-20 NOTE — Progress Notes (Signed)
PT Cancellation Note  Patient Details Name: [name] MRN: [id number] DOB: 07/05/1960   Cancelled Treatment:    Reason Eval/Treat Not Completed: Patient declined, no reason specified. Pt stated, "I don't feel like it today." Will continue attempts.    Shary Decamp [facility] 01/20/2022, 12:33 PM [place] Office [phone]

## 2022-01-20 NOTE — Consult Note (Addendum)
   [facility] CM Inpatient Consult   01/20/2022  Isami Mehra 19-Dec-1960 [id number]  [place] Organization [ACO] Patient: [employer] Medicare   Primary Care Provider: [name], MD, [facility]   Patient screened for hospitalization with noted high risk score for unplanned readmission risk and to assess for potential [place] Management service needs for post hospital transition.  Review of electronic patient's medical record reveals patient had been out reach by a [place] for Care Coordination in community.  Chart review of inpatient [facility] team notes for potential home IV antibiotics and community follow up. PT/OT notes for ongoing needs noted.  1508: Call to bedside. Spoke with the patient regarding post hospital needs he verbalized it's best to speak with his wife, Aquinna.  Will continue to follow for disposition needs as noted.  Patient agrees to follow up for particular needs.  Plan:  Continue to follow progress and disposition to assess for post hospital care management needs.    For questions contact:   Natividad Brood, RN BSN [place]  [phone] business mobile phone Toll free office [phone]  *[place]  [phone] Fax number: [phone] [email or web address]

## 2022-01-20 NOTE — Progress Notes (Signed)
Mobility Specialist - Progress Note   01/20/22 1427  Mobility  Activity Refused mobility   Pt refused stating he doesn't feel like it. Will follow up.  [name]

## 2022-01-20 NOTE — Progress Notes (Addendum)
PROGRESS NOTE    [name]  EGB:[id number] DOB: Apr 26, 1960 DOA: 01/14/2022 PCP: [name], MD  Chief Complaint  Patient presents with   Hypotension    Brief Narrative:  [name] is [name] 61 y.o. male with [name] history of severe AS, HFpEF, AFib on eliquis, stage IIIa CKD, HTN, HLD, OSA not on CPAP who presented for the 2nd time in 2 days on 10/13 with increasing right knee pain. He had been admitted 9/30 with group [name] streptococcal bacteremia, ARF and RLE cellulitis as well as suspected gout of the right knee treated with steroid injection. With negative echo, penicillin and linezolid were given with improvement, transitioned to amoxicillin for 14 days per ID and discharged 10/5. He returned ultimately 10/13 with worsening pain and lethargy. He was septic with hypotension responsive to resuscitation, leukocytosis (WBC 14), AKI (SCr 2.4 from 1.5). IV antibiotics given, cultures drawn, and ultimately MR right knee revealed mod-large complex effusion, synovial thickening, lateral femoral condyle focal erosion, minimal adjacent marrow edema, extensive muscular edema, quadriceps tendinosis with partial-thickness tear. 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   Assessment & Plan:   Principal Problem:   Severe sepsis with septic shock (HCC)   Assessment and Plan: Severe sepsis with AKI  Recent Group [name] Strep Bacteremia and Right Leg Cellulitis  Presumed Septic Arthritis of R Knee: Don't believe septic shock is correct since hypotension was responsive to IV fluids.  - With recent history of S. pyogenes bacteremia, plan was oral amoxicillin 1g q8h through 10/15. Will request ID input regarding this.  - appreciate ID recs, recommending treatment for presumed septic arthritis of R knee -> PICC, IV penicillin - now with growth from surgical specimens as noted below, will discuss again with ID - Monitor blood cultures, NGTD. - Pt to continue  coverage inclusive of group [name] strep per recent bacteremia.    Right knee pain concerning for tophaceous gout flare vs. septic arthritis with adjoining myositis:  - s/p arthrotomy R knee with closure over drain and application of biologic graft using kerecis to the right leg per orthopedics 10/16. 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  - rare staph cohnii from synovial tissue, will discuss with ID - Adjustments to pain control, change to oxycodone as he tolerated this better in the past. Also can give IV fentanyl (least histaminergic) prn.    AKI on stage IIIa CKD:  - Avoid nephrotoxins.    Persistent AFib with RVR: Rate stabilized - Continue coreg, cardizem - bleeding improved, will resume eliquis tonight   HTN, chronic HFpEF, aortic stenosis: - Coreg 25mg  po BID, irbesartan 30mg  daily, diltiazem 30mg  q6h, clonidine 0.2 mg TID, hydralazine 50 mg q8 - appreciate cardiology assistance  - BNP down from prior, required IVF resuscitation, will avoid hypotension and monitor I/O, volume status closely  - Follow up with [name] after all infection has been treated to pursue AVR. Also discussing MAZE.    CAD: No angina. Plan per cardiology.    HLD:  - Resume statin   OSA:  - Nocturnal O2, pt not on CPAP     DVT prophylaxis: eliquis Code Status: full Family Communication: family at bedside Disposition:   Status is: Inpatient Remains inpatient appropriate because: need for continued inpatient care   Consultants:  ID Cardiology orthpedics  Procedures:    Antimicrobials:  Anti-infectives (From admission, onward)    Start     Dose/Rate Route Frequency Ordered  Stop   01/20/22 0000  penicillin G IVPB        24 Million Units Intravenous Every 24 hours 01/20/22 1355 02/14/22 2359   01/19/22 2200  penicillin G potassium 12 Million Units in dextrose 5 % 500 mL continuous infusion        12 Million Units 41.7 mL/hr over 12 Hours Intravenous Every 12 hours  01/19/22 1151     01/16/22 1400  vancomycin (VANCOCIN) IVPB 1000 mg/200 mL premix  Status:  Discontinued        1,000 mg 200 mL/hr over 60 Minutes Intravenous Every 24 hours 01/16/22 1219 01/19/22 1151   01/15/22 1200  vancomycin (VANCOREADY) IVPB 1500 mg/300 mL        1,500 mg 150 mL/hr over 120 Minutes Intravenous  Once 01/15/22 0157 01/15/22 1353   01/15/22 1000  ceFEPIme (MAXIPIME) 2 g in sodium chloride 0.9 % 100 mL IVPB  Status:  Discontinued        2 g 200 mL/hr over 30 Minutes Intravenous Every 12 hours 01/15/22 0138 01/19/22 1151   01/15/22 0157  vancomycin variable dose per unstable renal function (pharmacist dosing)  Status:  Discontinued         Does not apply See admin instructions 01/15/22 0157 01/16/22 1220   01/14/22 2200  ceFEPIme (MAXIPIME) 2 g in sodium chloride 0.9 % 100 mL IVPB        2 g 200 mL/hr over 30 Minutes Intravenous  Once 01/14/22 2149 01/14/22 2311   01/14/22 2200  linezolid (ZYVOX) IVPB 600 mg        600 mg 300 mL/hr over 60 Minutes Intravenous Once 01/14/22 2149 01/15/22 0249       Subjective: No new complaints  Objective: Vitals:   01/19/22 1949 01/20/22 0600 01/20/22 0759 01/20/22 1329  BP:  (!) 165/111  (!) 152/100  Pulse: 73 79  78  Resp:  16 19 20   Temp: 97.8 F (36.6 C) 98.1 F (36.7 C)  98.6 F (37 C)  TempSrc: Oral Oral  Oral  SpO2:      Weight:      Height:        Intake/Output Summary (Last 24 hours) at 01/20/2022 1741 Last data filed at 01/20/2022 1400 Gross per 24 hour  Intake 240 ml  Output 1100 ml  Net -860 ml   Filed Weights   01/15/22 0130 01/18/22 0529 01/19/22 0553  Weight: 94.3 kg 92.8 kg 94.8 kg    Examination:  General: No acute distress. Cardiovascular: RRR Lungs: unlabored Abdomen: Soft, nontender, nondistended  Neurological: Alert and oriented 3. Moves all extremities 4 . Cranial nerves II through XII grossly intact. Extremities: dressing to R knee   Data Reviewed: I have personally reviewed  following labs and imaging studies  CBC: Recent Labs  Lab 01/16/22 0253 01/17/22 0156 01/18/22 0147 01/19/22 0211 01/20/22 0159  WBC 8.1 6.6 5.9 6.3 7.0  NEUTROABS 5.5 4.6 4.5 4.2 4.1  HGB 9.2* 8.6* 8.3* 7.9* 7.8*  HCT 26.4* 25.9* 24.8* 24.1* 23.1*  MCV 70.0* 70.8* 71.5* 70.9* 70.2*  PLT 201 179 173 187 [id number]    Basic Metabolic Panel: Recent Labs  Lab 01/16/22 0253 01/17/22 0156 01/18/22 0147 01/19/22 0211 01/20/22 0159  NA 137 135 135 139 139  K 3.8 4.0 4.6 4.0 4.2  CL 108 107 109 111 113*  CO2 20* 21* 19* 21* 21*  GLUCOSE 96 98 140* 165* 106*  BUN 22 17 20  24* 20  CREATININE 1.62* 1.48*  1.36* 1.46* 1.34*  CALCIUM 8.9 9.0 8.7* 8.9 8.6*  MG 1.9 1.8 2.1 2.1 1.7  PHOS 3.1  --   --   --  2.4*    GFR: Estimated Creatinine Clearance: 64.9 mL/min ([name]) (by C-G formula based on SCr of 1.34 mg/dL (H)).  Liver Function Tests: Recent Labs  Lab 01/16/22 0253 01/17/22 0156 01/18/22 0147 01/19/22 0211 01/20/22 0159  AST 12* 11* 16 18 15   ALT 13 12 14 18 17   ALKPHOS 49 57 57 61 57  BILITOT 0.9 0.8 0.7 0.6 0.6  PROT 6.7 6.7 6.7 6.4* 6.2*  ALBUMIN 2.3* 2.3* 2.2* 2.1* 2.2*    CBG: Recent Labs  Lab 01/14/22 2140  GLUCAP 152*     Recent Results (from the past 240 hour(s))  Blood Culture (routine x 2)     Status: None   Collection Time: 01/14/22 10:11 PM   Specimen: BLOOD  Result Value Ref Range Status   Specimen Description BLOOD BLOOD RIGHT ARM  Final   Special Requests   Final    BOTTLES DRAWN AEROBIC AND ANAEROBIC Blood Culture results may not be optimal due to an inadequate volume of blood received in culture bottles   Culture   Final    NO GROWTH 5 DAYS Performed at [facility], [place] [address], [place], [place] [address]    Report Status 01/19/2022 FINAL  Final  Body fluid culture w Gram Stain     Status: None   Collection Time: 01/15/22  3:08 PM   Specimen: Body Fluid  Result Value Ref Range Status   Specimen Description FLUID  Final   Special  Requests RIGHT KNEE ASPIRATION  Final   Gram Stain   Final    MODERATE WBC PRESENT, PREDOMINANTLY PMN NO ORGANISMS SEEN    Culture   Final    NO GROWTH 3 DAYS Performed at [facility], [address] [place], [place] [address]    Report Status 01/19/2022 FINAL  Final  Respiratory (~20 pathogens) panel by PCR     Status: None   Collection Time: 01/16/22 12:03 AM   Specimen: Nasopharyngeal Swab; Respiratory  Result Value Ref Range Status   Adenovirus NOT DETECTED NOT DETECTED Final   Coronavirus 229E NOT DETECTED NOT DETECTED Final    Comment: (NOTE) The Coronavirus on the Respiratory Panel, DOES NOT test for the novel  Coronavirus (2019 nCoV)    Coronavirus HKU1 NOT DETECTED NOT DETECTED Final   Coronavirus NL63 NOT DETECTED NOT DETECTED Final   Coronavirus OC43 NOT DETECTED NOT DETECTED Final   Metapneumovirus NOT DETECTED NOT DETECTED Final   Rhinovirus / Enterovirus NOT DETECTED NOT DETECTED Final   Influenza [name] NOT DETECTED NOT DETECTED Final   Influenza B NOT DETECTED NOT DETECTED Final   Parainfluenza Virus 1 NOT DETECTED NOT DETECTED Final   Parainfluenza Virus 2 NOT DETECTED NOT DETECTED Final   Parainfluenza Virus 3 NOT DETECTED NOT DETECTED Final   Parainfluenza Virus 4 NOT DETECTED NOT DETECTED Final   Respiratory Syncytial Virus NOT DETECTED NOT DETECTED Final   Bordetella pertussis NOT DETECTED NOT DETECTED Final   Bordetella Parapertussis NOT DETECTED NOT DETECTED Final   Chlamydophila pneumoniae NOT DETECTED NOT DETECTED Final   Mycoplasma pneumoniae NOT DETECTED NOT DETECTED Final    Comment: Performed at [facility], [place], [place], [place] [address]  Aerobic/Anaerobic Culture w Gram Stain (surgical/deep wound)     Status: None (Preliminary result)   Collection  Time: 01/17/22  9:18 AM   Specimen: Synovial, Right Knee; Body Fluid  Result Value Ref Range Status   Specimen Description KNEE  Final   Special Requests  RIGHT, JOINT FLUID   Final   Gram Stain   Final    MODERATE WBC PRESENT,BOTH PMN AND MONONUCLEAR NO ORGANISMS SEEN    Culture   Final    CULTURE REINCUBATED FOR BETTER GROWTH Performed at [facility], [address], [place], [place] [address]    Report Status PENDING  Incomplete  Aerobic/Anaerobic Culture w Gram Stain (surgical/deep wound)     Status: None (Preliminary result)   Collection Time: 01/17/22  9:21 AM   Specimen: Synovial, Right Knee; Tissue  Result Value Ref Range Status   Specimen Description TISSUE  Final   Special Requests  RIGHT KNEE  Final   Gram Stain   Final    FEW WBC PRESENT, PREDOMINANTLY PMN NO ORGANISMS SEEN    Culture   Final    RARE STAPHYLOCOCCUS COHNII CULTURE REINCUBATED FOR BETTER GROWTH Performed at [facility], [place] [address] [place], [place] [address]    Report Status PENDING  Incomplete         Radiology Studies: DG Chest Port 1 View  Result Date: 01/20/2022 CLINICAL DATA:  Status post PICC placement EXAM: PORTABLE CHEST 1 VIEW COMPARISON:  01/14/22 CXR FINDINGS: Interval placement of [name] right arm PICC which terminates in the upper SVC. No pleural effusion. No pneumothorax. Unchanged cardiac and mediastinal contours. Mild cardiomegaly. no focal airspace opacity. Visualized upper abdomen is unremarkable. No displaced rib fractures. IMPRESSION: Interval placement of [name] right arm PICC which terminates in the upper SVC. Electronically Signed   By: Marin Roberts M.D.   On: 01/20/2022 10:53   [place] EKG SITE RITE  Result Date: 01/19/2022 If Site Rite image not attached, placement could not be confirmed due to current cardiac rhythm.       Scheduled Meds:  atorvastatin  20 mg Oral Daily   carvedilol  25 mg Oral BID WC   Chlorhexidine Gluconate Cloth  6 each Topical Daily   cloNIDine  0.2 mg Oral TID   dapagliflozin propanediol  10 mg Oral Daily   ferrous sulfate  325 mg Oral QODAY   hydrALAZINE  50 mg Oral Q8H   irbesartan  300 mg Oral Daily    sodium chloride flush  10-40 mL Intracatheter Q12H   Continuous Infusions:  sodium chloride Stopped (01/19/22 1511)   penicillin G potassium 12 Million Units in dextrose 5 % 500 mL continuous infusion 12 Million Units (01/20/22 1109)     LOS: 5 days    Time spent: over 30 min    Shawn Helper, MD [facility]   To contact the attending provider between 7A-7P or the covering provider during after hours 7P-7A, please log into the web site [email or web address] and access using universal Shannon password for that web site. If you do not have the password, please call the hospital operator.  01/20/2022, 5:41 PM

## 2022-01-21 ENCOUNTER — Inpatient Hospital Stay (HOSPITAL_COMMUNITY): Payer: Medicare Other

## 2022-01-21 DIAGNOSIS — R6521 Severe sepsis with septic shock: Secondary | ICD-10-CM | POA: Diagnosis not present

## 2022-01-21 DIAGNOSIS — I5032 Chronic diastolic (congestive) heart failure: Secondary | ICD-10-CM

## 2022-01-21 DIAGNOSIS — I1A0 Resistant hypertension: Secondary | ICD-10-CM | POA: Diagnosis not present

## 2022-01-21 DIAGNOSIS — A419 Sepsis, unspecified organism: Secondary | ICD-10-CM | POA: Diagnosis not present

## 2022-01-21 DIAGNOSIS — I4819 Other persistent atrial fibrillation: Secondary | ICD-10-CM | POA: Diagnosis not present

## 2022-01-21 DIAGNOSIS — I350 Nonrheumatic aortic (valve) stenosis: Secondary | ICD-10-CM | POA: Diagnosis not present

## 2022-01-21 LAB — CBC WITH DIFFERENTIAL/PLATELET
Abs Immature Granulocytes: 0.05 10*3/uL (ref 0.00–0.07)
Basophils Absolute: 0 10*3/uL (ref 0.0–0.1)
Basophils Relative: 1 %
Eosinophils Absolute: 0.2 10*3/uL (ref 0.0–0.5)
Eosinophils Relative: 3 %
HCT: 24 % — ABNORMAL LOW (ref 39.0–52.0)
Hemoglobin: 8 g/dL — ABNORMAL LOW (ref 13.0–17.0)
Immature Granulocytes: 1 %
Lymphocytes Relative: 33 %
Lymphs Abs: 1.9 10*3/uL (ref 0.7–4.0)
MCH: 23.9 pg — ABNORMAL LOW (ref 26.0–34.0)
MCHC: 33.3 g/dL (ref 30.0–36.0)
MCV: 71.6 fL — ABNORMAL LOW (ref 80.0–100.0)
Monocytes Absolute: 0.6 10*3/uL (ref 0.1–1.0)
Monocytes Relative: 10 %
Neutro Abs: 3.1 10*3/uL (ref 1.7–7.7)
Neutrophils Relative %: 52 %
Platelets: 219 10*3/uL (ref 150–400)
RBC: 3.35 MIL/uL — ABNORMAL LOW (ref 4.22–5.81)
RDW: 16.9 % — ABNORMAL HIGH (ref 11.5–15.5)
WBC: 5.9 10*3/uL (ref 4.0–10.5)
nRBC: 0.7 % — ABNORMAL HIGH (ref 0.0–0.2)

## 2022-01-21 LAB — PHOSPHORUS: Phosphorus: 2.8 mg/dL (ref 2.5–4.6)

## 2022-01-21 LAB — COMPREHENSIVE METABOLIC PANEL
ALT: 16 U/L (ref 0–44)
AST: 17 U/L (ref 15–41)
Albumin: 2.2 g/dL — ABNORMAL LOW (ref 3.5–5.0)
Alkaline Phosphatase: 60 U/L (ref 38–126)
Anion gap: 6 (ref 5–15)
BUN: 14 mg/dL (ref 8–23)
CO2: 24 mmol/L (ref 22–32)
Calcium: 9 mg/dL (ref 8.9–10.3)
Chloride: 107 mmol/L (ref 98–111)
Creatinine, Ser: 1.32 mg/dL — ABNORMAL HIGH (ref 0.61–1.24)
GFR, Estimated: >60 mL/min (ref >60)
Glucose, Bld: 99 mg/dL (ref 70–99)
Potassium: 4.1 mmol/L (ref 3.5–5.1)
Sodium: 137 mmol/L (ref 135–145)
Total Bilirubin: 0.5 mg/dL (ref 0.3–1.2)
Total Protein: 6.5 g/dL (ref 6.5–8.1)

## 2022-01-21 LAB — MAGNESIUM: Magnesium: 1.5 mg/dL — ABNORMAL LOW (ref 1.7–2.4)

## 2022-01-21 LAB — CK: Total CK: 13 U/L — ABNORMAL LOW (ref 49–397)

## 2022-01-21 MED ORDER — SODIUM CHLORIDE 0.9 % IV SOLN
8.0000 mg/kg | Freq: Every day | INTRAVENOUS | Status: DC
  Administered 2022-01-21 – 2022-01-25 (×5): 650 mg via INTRAVENOUS
  Filled 2022-01-21 (×5): qty 13

## 2022-01-21 MED ORDER — APIXABAN 5 MG PO TABS
5.0000 mg | ORAL_TABLET | Freq: Two times a day (BID) | ORAL | Status: DC
  Administered 2022-01-21 – 2022-01-25 (×9): 5 mg via ORAL
  Filled 2022-01-21 (×9): qty 1

## 2022-01-21 MED ORDER — ASPIRIN 81 MG PO CHEW
81.0000 mg | CHEWABLE_TABLET | Freq: Every day | ORAL | Status: DC
  Administered 2022-01-21 – 2022-01-25 (×5): 81 mg via ORAL
  Filled 2022-01-21 (×5): qty 1

## 2022-01-21 MED ORDER — FUROSEMIDE 40 MG PO TABS
40.0000 mg | ORAL_TABLET | Freq: Every day | ORAL | Status: DC
  Administered 2022-01-21 – 2022-01-24 (×4): 40 mg via ORAL
  Filled 2022-01-21 (×4): qty 1

## 2022-01-21 MED ORDER — ALLOPURINOL 100 MG PO TABS
100.0000 mg | ORAL_TABLET | Freq: Every day | ORAL | Status: DC
  Administered 2022-01-21 – 2022-01-25 (×5): 100 mg via ORAL
  Filled 2022-01-21 (×5): qty 1

## 2022-01-21 NOTE — Progress Notes (Addendum)
PROGRESS NOTE    [name]  [id number] DOB: 12-07-1960 DOA: 01/14/2022 PCP: [name], MD  Chief Complaint  Patient presents with   Hypotension    Brief Narrative:  [name] is [name] 61 y.o. male with [name] history of severe AS, HFpEF, AFib on eliquis, stage IIIa CKD, HTN, HLD, OSA not on CPAP who presented for the 2nd time in 2 days on 10/13 with increasing right knee pain. He had been admitted 9/30 with group [name] streptococcal bacteremia, ARF and RLE Small as well as suspected gout of the right knee treated with steroid injection. With negative echo, penicillin and linezolid were given with improvement, transitioned to amoxicillin for 14 days per ID and discharged 10/5. He returned ultimately 10/13 with worsening pain and lethargy. He was septic with hypotension responsive to resuscitation, leukocytosis (WBC 14), AKI (SCr 2.4 from 1.5). IV antibiotics given, cultures drawn, and ultimately MR right knee revealed mod-large complex effusion, synovial thickening, lateral femoral condyle focal erosion, minimal adjacent marrow edema, extensive muscular edema, quadriceps tendinosis with partial-thickness tear. 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   Assessment & Plan:   Principal Problem:   Severe sepsis with septic shock (HCC)   Assessment and Plan: Nausea  Dyspnea on Exertion - significant nausea/DOE when up today, improved now that he's back in bed - CXR without acute pulm process - lasix restarted per cards, BNP pending - ? If symptoms related to aortic stenosis - appreciate cardiology assistance  Severe sepsis with AKI  Recent Group [name]  Presumed Septic Arthritis of R Knee: Don't believe septic shock is correct since hypotension was responsive to IV fluids.  - With recent history of S. pyogenes bacteremia, plan was oral amoxicillin 1g q8h through 10/15. Will request ID input  regarding this.  - appreciate ID recs, recommending treatment for presumed septic arthritis of R knee -> PICC, IV daptomycin  - now with growth from surgical specimens as noted below, will discuss again with ID - Monitor blood cultures, NGTD. - Pt to continue coverage inclusive of group Eris Hannan strep per recent bacteremia.    CoNS Septic Arthritis of Right Knee  Tophaceous Gout Flare - s/p arthrotomy R knee with closure over drain and application of biologic graft using kerecis to the right leg per orthopedics 10/16. 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  - rare staph cohnii from synovial tissue, staph auricularis -> abx changed to daptomycin x4 weeks - will start allopurinol with crystals seen on synovial fluid  - Adjustments to pain control, change to oxycodone as he tolerated this better in the past. Also can give IV fentanyl (least histaminergic) prn.    AKI on stage IIIa CKD:  - Avoid nephrotoxins.    Persistent AFib with RVR: Rate stabilized - Continue coreg, cardizem - eliquis resumed this morning   HTN, chronic HFpEF, aortic stenosis: - Coreg 25mg  po BID, irbesartan 30mg  daily, diltiazem 30mg  q6h, clonidine 0.2 mg TID, hydralazine 50 mg q8 - appreciate cardiology assistance  - lasix restarted per cards - BNP down from prior, required IVF resuscitation, will avoid hypotension and monitor I/O, volume status closely  - Follow up with [name] after all infection has been treated to pursue AVR. Also discussing MAZE.    CAD: No angina. Plan per cardiology.    HLD:  - Resume statin   OSA:  - Nocturnal O2, pt not on CPAP  DVT prophylaxis: eliquis Code Status: full Family Communication: family at bedside Disposition:   Status is: Inpatient Remains inpatient appropriate because: need for continued inpatient care   Consultants:  ID Cardiology orthpedics  Procedures:    Antimicrobials:  Anti-infectives (From admission, onward)    Start      Dose/Rate Route Frequency Ordered Stop   01/21/22 1200  DAPTOmycin (CUBICIN) 650 mg in sodium chloride 0.9 % IVPB        8 mg/kg  80.3 kg (Adjusted) 126 mL/hr over 30 Minutes Intravenous Daily 01/21/22 1103     01/20/22 0000  penicillin G IVPB  Status:  Discontinued        24 Million Units Intravenous Every 24 hours 01/20/22 1355 01/21/22    01/19/22 2200  penicillin G potassium 12 Million Units in dextrose 5 % 500 mL continuous infusion  Status:  Discontinued        12 Million Units 41.7 mL/hr over 12 Hours Intravenous Every 12 hours 01/19/22 1151 01/21/22 1103   01/16/22 1400  vancomycin (VANCOCIN) IVPB 1000 mg/200 mL premix  Status:  Discontinued        1,000 mg 200 mL/hr over 60 Minutes Intravenous Every 24 hours 01/16/22 1219 01/19/22 1151   01/15/22 1200  vancomycin (VANCOREADY) IVPB 1500 mg/300 mL        1,500 mg 150 mL/hr over 120 Minutes Intravenous  Once 01/15/22 0157 01/15/22 1353   01/15/22 1000  ceFEPIme (MAXIPIME) 2 g in sodium chloride 0.9 % 100 mL IVPB  Status:  Discontinued        2 g 200 mL/hr over 30 Minutes Intravenous Every 12 hours 01/15/22 0138 01/19/22 1151   01/15/22 0157  vancomycin variable dose per unstable renal function (pharmacist dosing)  Status:  Discontinued         Does not apply See admin instructions 01/15/22 0157 01/16/22 1220   01/14/22 2200  ceFEPIme (MAXIPIME) 2 g in sodium chloride 0.9 % 100 mL IVPB        2 g 200 mL/hr over 30 Minutes Intravenous  Once 01/14/22 2149 01/14/22 2311   01/14/22 2200  linezolid (ZYVOX) IVPB 600 mg        600 mg 300 mL/hr over 60 Minutes Intravenous Once 01/14/22 2149 01/15/22 0249       Subjective: No new complaints C/o of nausea when he was up No CP, no SOB Feeling better at rest  Objective: Vitals:   01/21/22 0432 01/21/22 0858 01/21/22 1455 01/21/22 1559  BP: (!) 145/95 135/86 (!) 112/97 126/88  Pulse: 78 99 93 82  Resp: 20 13 17 17   Temp: 98.2 F (36.8 C) 98 F (36.7 C)    TempSrc: Oral Oral     SpO2: 99% 100% 100%   Weight:      Height:        Intake/Output Summary (Last 24 hours) at 01/21/2022 1625 Last data filed at 01/21/2022 1457 Gross per 24 hour  Intake 797.86 ml  Output 600 ml  Net 197.86 ml   Filed Weights   01/15/22 0130 01/18/22 0529 01/19/22 0553  Weight: 94.3 kg 92.8 kg 94.8 kg    Examination:  General: No acute distress. Cardiovascular: irregularly irregular Lungs: CTAB, unlabored Abdomen: Soft, nontender, nondistended  Neurological: Alert and oriented 3. Moves all extremities 4 with equal strength. Cranial nerves II through XII grossly intact. Extremities: R knee dressing intact  Data Reviewed: I have personally reviewed following labs and imaging studies  CBC: Recent Labs  Lab  01/17/22 0156 01/18/22 0147 01/19/22 0211 01/20/22 0159 01/21/22 0500  WBC 6.6 5.9 6.3 7.0 5.9  NEUTROABS 4.6 4.5 4.2 4.1 3.1  HGB 8.6* 8.3* 7.9* 7.8* 8.0*  HCT 25.9* 24.8* 24.1* 23.1* 24.0*  MCV 70.8* 71.5* 70.9* 70.2* 71.6*  PLT 179 173 187 196 [id number]    Basic Metabolic Panel: Recent Labs  Lab 01/16/22 0253 01/17/22 0156 01/18/22 0147 01/19/22 0211 01/20/22 0159 01/21/22 0500  NA 137 135 135 139 139 137  K 3.8 4.0 4.6 4.0 4.2 4.1  CL 108 107 109 111 113* 107  CO2 20* 21* 19* 21* 21* 24  GLUCOSE 96 98 140* 165* 106* 99  BUN 22 17 20  24* 20 14  CREATININE 1.62* 1.48* 1.36* 1.46* 1.34* 1.32*  CALCIUM 8.9 9.0 8.7* 8.9 8.6* 9.0  MG 1.9 1.8 2.1 2.1 1.7 1.5*  PHOS 3.1  --   --   --  2.4* 2.8    GFR: Estimated Creatinine Clearance: 65.9 mL/min (Karmine Kauer) (by C-G formula based on SCr of 1.32 mg/dL (H)).  Liver Function Tests: Recent Labs  Lab 01/17/22 0156 01/18/22 0147 01/19/22 0211 01/20/22 0159 01/21/22 0500  AST 11* 16 18 15 17   ALT 12 14 18 17 16   ALKPHOS 57 57 61 57 60  BILITOT 0.8 0.7 0.6 0.6 0.5  PROT 6.7 6.7 6.4* 6.2* 6.5  ALBUMIN 2.3* 2.2* 2.1* 2.2* 2.2*    CBG: Recent Labs  Lab 01/14/22 2140  GLUCAP 152*     Recent Results (from  the past 240 hour(s))  Blood Culture (routine x 2)     Status: None   Collection Time: 01/14/22 10:11 PM   Specimen: BLOOD  Result Value Ref Range Status   Specimen Description BLOOD BLOOD RIGHT ARM  Final   Special Requests   Final    BOTTLES DRAWN AEROBIC AND ANAEROBIC Blood Culture results may not be optimal due to an inadequate volume of blood received in culture bottles   Culture   Final    NO GROWTH 5 DAYS Performed at [facility], [place] [address] [place], [place] [address]    Report Status 01/19/2022 FINAL  Final  Body fluid culture w Gram Stain     Status: None   Collection Time: 01/15/22  3:08 PM   Specimen: Body Fluid  Result Value Ref Range Status   Specimen Description FLUID  Final   Special Requests RIGHT KNEE ASPIRATION  Final   Gram Stain   Final    MODERATE WBC PRESENT, PREDOMINANTLY PMN NO ORGANISMS SEEN    Culture   Final    NO GROWTH 3 DAYS Performed at [facility], [address] [place], [place] [address]    Report Status 01/19/2022 FINAL  Final  Respiratory (~20 pathogens) panel by PCR     Status: None   Collection Time: 01/16/22 12:03 AM   Specimen: Nasopharyngeal Swab; Respiratory  Result Value Ref Range Status   Adenovirus NOT DETECTED NOT DETECTED Final   Coronavirus 229E NOT DETECTED NOT DETECTED Final    Comment: (NOTE) The Coronavirus on the Respiratory Panel, DOES NOT test for the novel  Coronavirus (2019 nCoV)    Coronavirus HKU1 NOT DETECTED NOT DETECTED Final   Coronavirus NL63 NOT DETECTED NOT DETECTED Final   Coronavirus OC43 NOT DETECTED NOT DETECTED Final   Metapneumovirus NOT DETECTED NOT DETECTED Final   Rhinovirus / Enterovirus NOT DETECTED NOT DETECTED Final   Influenza Henri Baumler NOT DETECTED NOT DETECTED Final  Influenza B NOT DETECTED NOT DETECTED Final   Parainfluenza Virus 1 NOT DETECTED NOT DETECTED Final   Parainfluenza Virus 2 NOT DETECTED NOT DETECTED Final   Parainfluenza Virus 3 NOT DETECTED NOT DETECTED Final    Parainfluenza Virus 4 NOT DETECTED NOT DETECTED Final   Respiratory Syncytial Virus NOT DETECTED NOT DETECTED Final   Bordetella pertussis NOT DETECTED NOT DETECTED Final   Bordetella Parapertussis NOT DETECTED NOT DETECTED Final   Chlamydophila pneumoniae NOT DETECTED NOT DETECTED Final   Mycoplasma pneumoniae NOT DETECTED NOT DETECTED Final    Comment: Performed at [facility], [place] [address], [place], [place] [address]  Aerobic/Anaerobic Culture w Gram Stain (surgical/deep wound)     Status: None (Preliminary result)   Collection Time: 01/17/22  9:18 AM   Specimen: Synovial, Right Knee; Body Fluid  Result Value Ref Range Status   Specimen Description KNEE  Final   Special Requests  RIGHT, JOINT FLUID  Final   Gram Stain   Final    MODERATE WBC PRESENT,BOTH PMN AND MONONUCLEAR NO ORGANISMS SEEN Performed at [facility], [address] [place], [place] [address]    Culture   Final    RARE STAPHYLOCOCCUS AURICULARIS NO ANAEROBES ISOLATED; CULTURE IN PROGRESS FOR 5 DAYS    Report Status PENDING  Incomplete   Organism ID, Bacteria STAPHYLOCOCCUS AURICULARIS  Final      Susceptibility   Staphylococcus auricularis - MIC*    CIPROFLOXACIN <=0.5 SENSITIVE Sensitive     ERYTHROMYCIN 0.5 SENSITIVE Sensitive     GENTAMICIN <=0.5 SENSITIVE Sensitive     OXACILLIN RESISTANT Resistant     TETRACYCLINE <=1 SENSITIVE Sensitive     VANCOMYCIN 1 SENSITIVE Sensitive     TRIMETH/SULFA <=10 SENSITIVE Sensitive     CLINDAMYCIN <=0.25 SENSITIVE Sensitive     RIFAMPIN <=0.5 SENSITIVE Sensitive     Inducible Clindamycin NEGATIVE Sensitive     * RARE STAPHYLOCOCCUS AURICULARIS  Aerobic/Anaerobic Culture w Gram Stain (surgical/deep wound)     Status: None (Preliminary result)   Collection Time: 01/17/22  9:21 AM   Specimen: Synovial, Right Knee; Tissue  Result Value Ref Range Status   Specimen Description TISSUE  Final   Special Requests  RIGHT KNEE  Final   Gram Stain   Final     FEW WBC PRESENT, PREDOMINANTLY PMN NO ORGANISMS SEEN    Culture   Final    RARE STAPHYLOCOCCUS COHNII CULTURE REINCUBATED FOR BETTER GROWTH Performed at [facility], [address], [place], [place] [address]    Report Status PENDING  Incomplete         Radiology Studies: DG CHEST PORT 1 VIEW  Result Date: 01/21/2022 CLINICAL DATA:  Shortness of breath EXAM: PORTABLE CHEST 1 VIEW COMPARISON:  01/20/2022 FINDINGS: Unchanged position of right arm PICC with tip in the upper SVC. Unchanged cardiac and mediastinal contours. No focal pulmonary opacity. No pleural effusion or pneumothorax. No acute osseous abnormality. IMPRESSION: No acute cardiopulmonary process. Electronically Signed   By: Merilyn Baba M.D.   On: 01/21/2022 13:41   DG Chest Port 1 View  Result Date: 01/20/2022 CLINICAL DATA:  Status post PICC placement EXAM: PORTABLE CHEST 1 VIEW COMPARISON:  01/14/22 CXR FINDINGS: Interval placement of Chelbie Jarnagin right arm PICC which terminates in the upper SVC. No pleural effusion. No pneumothorax. Unchanged cardiac and mediastinal contours. Mild cardiomegaly. no focal airspace opacity. Visualized upper abdomen is unremarkable. No displaced rib fractures. IMPRESSION: Interval placement of Cheskel Silverio  right arm PICC which terminates in the upper SVC. Electronically Signed   By: Marin Roberts M.D.   On: 01/20/2022 10:53   [place] EKG SITE RITE  Result Date: 01/19/2022 If Site Rite image not attached, placement could not be confirmed due to current cardiac rhythm.       Scheduled Meds:  apixaban  5 mg Oral BID   aspirin  81 mg Oral Daily   atorvastatin  20 mg Oral Daily   carvedilol  25 mg Oral BID WC   Chlorhexidine Gluconate Cloth  6 each Topical Daily   cloNIDine  0.2 mg Oral TID   dapagliflozin propanediol  10 mg Oral Daily   ferrous sulfate  325 mg Oral QODAY   furosemide  40 mg Oral Daily   hydrALAZINE  50 mg Oral Q8H   irbesartan  300 mg Oral Daily   sodium chloride flush  10-40 mL  Intracatheter Q12H   Continuous Infusions:  sodium chloride Stopped (01/19/22 1511)   DAPTOmycin (CUBICIN) 650 mg in sodium chloride 0.9 % IVPB 650 mg (01/21/22 1457)     LOS: 6 days    Time spent: over 30 min    Fayrene Helper, MD [facility]   To contact the attending provider between 7A-7P or the covering provider during after hours 7P-7A, please log into the web site [email or web address] and access using universal [place] password for that web site. If you do not have the password, please call the hospital operator.  01/21/2022, 4:25 PM

## 2022-01-21 NOTE — Progress Notes (Addendum)
Rounding Note    Patient Name: [name] Date of Encounter: 01/21/2022  [place] Cardiologist: Donato Heinz, MD   Subjective   Denies any CP or SOB.   Inpatient Medications    Scheduled Meds:  atorvastatin  20 mg Oral Daily   carvedilol  25 mg Oral BID WC   Chlorhexidine Gluconate Cloth  6 each Topical Daily   cloNIDine  0.2 mg Oral TID   dapagliflozin propanediol  10 mg Oral Daily   ferrous sulfate  325 mg Oral QODAY   hydrALAZINE  50 mg Oral Q8H   irbesartan  300 mg Oral Daily   sodium chloride flush  10-40 mL Intracatheter Q12H   Continuous Infusions:  sodium chloride Stopped (01/19/22 1511)   penicillin G potassium 12 Million Units in dextrose 5 % 500 mL continuous infusion 12 Million Units (01/20/22 2150)   PRN Meds: sodium chloride, acetaminophen **OR** acetaminophen, albuterol, fentaNYL (SUBLIMAZE) injection, hydrOXYzine, labetalol, ondansetron **OR** ondansetron (ZOFRAN) IV, oxyCODONE, sodium chloride flush   Vital Signs    Vitals:   01/20/22 0759 01/20/22 1329 01/20/22 2033 01/21/22 0432  BP:  (!) 152/100 (!) 141/89 (!) 145/95  Pulse:  78 87 78  Resp: 19 20 20 20   Temp:  98.6 F (37 C) 99.3 F (37.4 C) 98.2 F (36.8 C)  TempSrc:  Oral Oral Oral  SpO2:    99%  Weight:      Height:        Intake/Output Summary (Last 24 hours) at 01/21/2022 0658 Last data filed at 01/20/2022 1800 Gross per 24 hour  Intake 797.86 ml  Output 1100 ml  Net -302.14 ml      01/19/2022    5:53 AM 01/18/2022    5:29 AM 01/15/2022    1:30 AM  Last 3 Weights  Weight (lbs) 208 lb 15.9 oz 204 lb 9.4 oz 207 lb 14.3 oz  Weight (kg) 94.8 kg 92.8 kg 94.3 kg      Telemetry    Atrial fibrillation with HR 70-80s, occasional pause up to 2.5 sec - Personally Reviewed  ECG    Atrial fibrillation without significant ST-T wave changes - Personally Reviewed  Physical Exam   GEN: No acute distress.   Neck: No JVD Cardiac: irregularly irregular, no  murmurs, rubs, or gallops.  Respiratory: Clear to auscultation bilaterally. GI: Soft, nontender, non-distended  MS: No edema; No deformity. Neuro:  Nonfocal  Psych: Normal affect   Labs    High Sensitivity Troponin:   Recent Labs  Lab 01/13/22 0424 01/14/22 2128 01/15/22 0150  TROPONINIHS 16 29* 29*     Chemistry Recent Labs  Lab 01/19/22 0211 01/20/22 0159 01/21/22 0500  NA 139 139 137  K 4.0 4.2 4.1  CL 111 113* 107  CO2 21* 21* 24  GLUCOSE 165* 106* 99  BUN 24* 20 14  CREATININE 1.46* 1.34* 1.32*  CALCIUM 8.9 8.6* 9.0  MG 2.1 1.7 1.5*  PROT 6.4* 6.2* 6.5  ALBUMIN 2.1* 2.2* 2.2*  AST 18 15 17   ALT 18 17 16   ALKPHOS 61 57 60  BILITOT 0.6 0.6 0.5  GFRNONAA 54* 60* >60  ANIONGAP 7 5 6     Lipids No results for input(s): "CHOL", "TRIG", "HDL", "LABVLDL", "LDLCALC", "CHOLHDL" in the last 168 hours.  Hematology Recent Labs  Lab 01/19/22 0211 01/20/22 0159 01/21/22 0500  WBC 6.3 7.0 5.9  RBC 3.40* 3.29* 3.35*  HGB 7.9* 7.8* 8.0*  HCT 24.1* 23.1* 24.0*  MCV  70.9* 70.2* 71.6*  MCH 23.2* 23.7* 23.9*  MCHC 32.8 33.8 33.3  RDW 15.9* 16.0* 16.9*  PLT 187 196 219   Thyroid No results for input(s): "TSH", "FREET4" in the last 168 hours.  BNP Recent Labs  Lab 01/17/22 0156 01/18/22 0147 01/19/22 0211  BNP 546.3* 669.1* 478.3*    DDimer No results for input(s): "DDIMER" in the last 168 hours.   Radiology    DG Chest Port 1 View  Result Date: 01/20/2022 CLINICAL DATA:  Status post PICC placement EXAM: PORTABLE CHEST 1 VIEW COMPARISON:  01/14/22 CXR FINDINGS: Interval placement of a right arm PICC which terminates in the upper SVC. No pleural effusion. No pneumothorax. Unchanged cardiac and mediastinal contours. Mild cardiomegaly. no focal airspace opacity. Visualized upper abdomen is unremarkable. No displaced rib fractures. IMPRESSION: Interval placement of a right arm PICC which terminates in the upper SVC. Electronically Signed   By: Marin Roberts M.D.    On: 01/20/2022 10:53   [place] EKG SITE RITE  Result Date: 01/19/2022 If Site Rite image not attached, placement could not be confirmed due to current cardiac rhythm.   Cardiac Studies   Cath 05/27/2021   Ost RCA to Prox RCA lesion is 40% stenosed.   Prox RCA to Mid RCA lesion is 20% stenosed.   RPDA lesion is 30% stenosed.   RPAV lesion is 50% stenosed.   Dist Cx lesion is 50% stenosed.   2nd Diag lesion is 90% stenosed.   Prox LAD to Mid LAD lesion is 30% stenosed.   Dist LAD lesion is 90% stenosed.   The LAD is a large caliber vessel that courses to the apex. There is mild non-obstructive disease in the proximal and mid LAD. The apical LAD has diffuse severe stenosis. The first Diagonal is a moderate caliber vessel with mild non-obstructive disease. The second Diagonal branch is a moderate caliber vessel with a focal severe stenosis The Circumflex is a large caliber vessel with moderate distal stenosis The RCA is a large dominant artery with mild eccentric proximal stenosis. The PDA and posterolateral arteries have moderate diffuse disease.  Severe aortic stenosis by echo. The aortic valve was not crossed today.    RA 14 RV 42/12/19 PA 44/22 (mean 27) PCWP 19 AO 103/64   Will continue workup for AVR. Given his severe LVH and young age, he may be better treated with surgical AVR than TAVR. Will review with our valve team and make arrangements for CT scans if felt necessary before being seen by our CT surgeon.    Echo 12/28/2021 1. Left ventricular ejection fraction, by estimation, is 60 to 65%. The  left ventricle has normal function. The left ventricle has no regional  wall motion abnormalities. There is severe concentric left ventricular  hypertrophy. Left ventricular diastolic   function could not be evaluated. Elevated left atrial pressure.   2. Right ventricular systolic function is normal. The right ventricular  size is normal. There is mildly elevated pulmonary artery  systolic  pressure.   3. Left atrial size was severely dilated.   4. Right atrial size was mildly dilated.   5. The mitral valve is normal in structure. Mild mitral valve  regurgitation. No evidence of mitral stenosis.   6. The aortic valve is tricuspid. Aortic valve regurgitation is mild.  Moderate to severe aortic valve stenosis.   7. Aortic dilatation noted. There is mild dilatation of the ascending  aorta, measuring 40 mm.   8. The inferior vena cava  is normal in size with greater than 50%  respiratory variability, suggesting right atrial pressure of 3 mmHg.   Patient Profile     61 y.o. male with PMH of chronic diastolic CHF, resistant HTN, moderate to severe AS, PAF, HLD and PAD who had recent S pyogenes bacteremia and R leg cellulitis and is now admitted with sepsis secondary to R septic arthritis of knee. Cardiology consulted for afib with RVR since 01/16/2022.   Assessment & Plan    Persistent afib with RVR  - in the setting of sepsis  - IV cardizem weaned off, now on coreg 25mg  BID for rate control.   - plan for biatrial Maze during AVR  - hospitalist note from yesterday mentioned potentially restart Eliquis, I do not see Eliquis ordered. We were waiting for ortho to clear the patient to restart Eliquis.   Severe aortic stenosis  - Echo 12/28/2021 EF 60-65%, moderate to severe AS, dilated ascending aorta at 40 mm, aortic valve mean gradient 29 mmHg - patient was previously scheduled for AVR in 07/2021, surgery delayed due to leg wound. Seen [name] on 12/23/2021 with plan to repeat echo (done on 12/28/2021), then schedule AVR with bioprosthetic valve and CABG to diagonal, biatrial Maze   Chronic diastolic CHF: restarted on farxiga. On 40mg  TID of lasix at home (although home med says 60mg  TID, however patient has only been taking 40mg  TID dosing), held given AKI. Cr down to 1.32 which is his baseline. Patient remains euvolemic. Will restart home lasix at a lower dose of 40mg   daily to see if he can get away with using a lower dose.   Resistant HTN: clonidine, hydralazine, coreg and olmesartan at home. SBP 140s  Septic arthritis of R knee: underwent I&D on 10/16. ID consulted on 10/18, directing abx usage.   Acute on CKD: Cr on arrival 2.4, now down to 1.3 which is his baseline.   CAD: see cath report 05/27/2021      For questions or updates, please contact [place] Please consult [email or web address] for contact info under        Signed, Almyra Deforest, [place]  01/21/2022, 6:58 AM    Patient seen and examined.  Agree with above documentation.  On exam, patient is alert and oriented, normal rate, irregular rhythm, 3/6 systolic murmur, lungs CTAB, no LE edema, mild JVD.  Had dyspnea/tachycardia with minimal exertion today.  Suspect due to his AS.  Will add back home lasix, but reduced dose to 40 mg daily.  Check CXR, BNP  Donato Heinz, MD

## 2022-01-21 NOTE — Progress Notes (Signed)
[facility] for Infectious Disease    Date of Admission:  01/14/2022   Total days of antibiotics 8/dapto 1           ID: [name] is a 61 y.o. male with  with right knee septic arthritis s/p wash out with hx of recent group a strep bacteremia and concurrent gouty arthritis Principal Problem:   Severe sepsis with septic shock ([place])    Subjective: Nauseated with pain as he is attempting to ambulate in room with walker  Medications:   apixaban  5 mg Oral BID   aspirin  81 mg Oral Daily   atorvastatin  20 mg Oral Daily   carvedilol  25 mg Oral BID WC   Chlorhexidine Gluconate Cloth  6 each Topical Daily   cloNIDine  0.2 mg Oral TID   dapagliflozin propanediol  10 mg Oral Daily   ferrous sulfate  325 mg Oral QODAY   furosemide  40 mg Oral Daily   hydrALAZINE  50 mg Oral Q8H   irbesartan  300 mg Oral Daily   sodium chloride flush  10-40 mL Intracatheter Q12H    Objective: Vital signs in last 24 hours: Temp:  [98 F (36.7 C)-99.3 F (37.4 C)] 98 F (36.7 C) (10/20 0858) Pulse Rate:  [78-99] 99 (10/20 0858) Resp:  [13-20] 13 (10/20 0858) BP: (135-145)/(86-95) 135/86 (10/20 0858) SpO2:  [99 %-100 %] 100 % (10/20 0858)  Physical Exam  Constitutional: He is oriented to person, place, and time. He appears well-developed and well-nourished. No distress.  HENT:  Mouth/Throat: Oropharynx is clear and moist. No oropharyngeal exudate.  Cardiovascular: tachy, regular rhythm and normal heart sounds. Exam reveals no gallop and no friction rub.  No murmur heard.  Ext: right knee wrapped Neurological: He is alert and oriented to person, place, and time.  Skin: Skin is warm and dry. No rash noted. No erythema.  Psychiatric: He has a normal mood and affect. His behavior is normal.    Lab Results Recent Labs    01/20/22 0159 01/21/22 0500  WBC 7.0 5.9  HGB 7.8* 8.0*  HCT 23.1* 24.0*  NA 139 137  K 4.2 4.1  CL 113* 107  CO2 21* 24  BUN 20 14  CREATININE 1.34* 1.32*    Liver Panel Recent Labs    01/20/22 0159 01/21/22 0500  PROT 6.2* 6.5  ALBUMIN 2.2* 2.2*  AST 15 17  ALT 17 16  ALKPHOS 57 60  BILITOT 0.6 0.5   Sedimentation Rate Recent Labs    01/20/22 0159  ESRSEDRATE 51*   C-Reactive Protein Recent Labs    01/19/22 0211 01/20/22 0159  CRP 6.8* 3.4*    Microbiology: Special Requests  RIGHT, JOINT FLUID   Gram Stain MODERATE WBC PRESENT,BOTH PMN AND MONONUCLEAR  NO ORGANISMS SEEN  Performed at [facility], [place] [address] [place], [place] [address]   Culture RARE STAPHYLOCOCCUS AURICULARIS  NO ANAEROBES ISOLATED; CULTURE IN PROGRESS FOR 5 DAYS   Report Status PENDING   Organism ID, Bacteria STAPHYLOCOCCUS AURICULARIS   Resulting Agency [facility]     Susceptibility   Staphylococcus auricularis    MIC    CIPROFLOXACIN <=0.5 SENSI... Sensitive    CLINDAMYCIN <=0.25 SENS... Sensitive    ERYTHROMYCIN 0.5 SENSITIVE  Sensitive    GENTAMICIN <=0.5 SENSI... Sensitive    Inducible Clindamycin NEGATIVE  Sensitive    OXACILLIN RESISTANT  Resistant    RIFAMPIN <=0.5 SENSI... Sensitive  TETRACYCLINE <=1 SENSITIVE  Sensitive    TRIMETH/SULFA <=10 SENSIT... Sensitive    VANCOMYCIN 1 SENSITIVE  Sensitive          Studies/Results: DG CHEST PORT 1 VIEW  Result Date: 01/21/2022 CLINICAL DATA:  Shortness of breath EXAM: PORTABLE CHEST 1 VIEW COMPARISON:  01/20/2022 FINDINGS: Unchanged position of right arm PICC with tip in the upper SVC. Unchanged cardiac and mediastinal contours. No focal pulmonary opacity. No pleural effusion or pneumothorax. No acute osseous abnormality. IMPRESSION: No acute cardiopulmonary process. Electronically Signed   By: Merilyn Baba M.D.   On: 01/21/2022 13:41   DG Chest Port 1 View  Result Date: 01/20/2022 CLINICAL DATA:  Status post PICC placement EXAM: PORTABLE CHEST 1 VIEW COMPARISON:  01/14/22 CXR FINDINGS: Interval placement of a right arm PICC which terminates in the upper SVC. No  pleural effusion. No pneumothorax. Unchanged cardiac and mediastinal contours. Mild cardiomegaly. no focal airspace opacity. Visualized upper abdomen is unremarkable. No displaced rib fractures. IMPRESSION: Interval placement of a right arm PICC which terminates in the upper SVC. Electronically Signed   By: Marin Roberts M.D.   On: 01/20/2022 10:53   [place] EKG SITE RITE  Result Date: 01/19/2022 If Site Rite image not attached, placement could not be confirmed due to current cardiac rhythm.    Assessment/Plan: CoNS septic arthritis of right knee s/p wash out = will change iv abtx from penicillin to daptomycin at 8mg /kg per day. Will check weekly CK. And arrange for follow up in the ID clinic. Plan for 4 weeks then decide if needs further oral suppression. Will avoid using vancomycin to be kidney sparing  Decondition/knee pain = defer to primary team for management  Afib = appears to be symptomatic when ambulating. Will need further assessment.  Diagnosis: Septic arthritis  Culture Result: s.auricularis (oxa R)  Allergies  Allergen Reactions   Adhesive [Tape] Other (See Comments)    Makes the skin feel as if it is burning, will also bruise the skin. Pt. prefers paper tape   Latex Hives, Itching and Other (See Comments)    Burns skin, also    OPAT Orders Discharge antibiotics to be given via PICC line Discharge antibiotics: Per pharmacy protocol daptomycin at 8mg ./kg x 4 wk  Duration: 4 wk End Date: 02/18/2022  [facility] Care Per Protocol:  Home health RN for IV administration and teaching; PICC line care and labs.    Labs weekly while on IV antibiotics: __x CBC with differential _x_ BMP  _x_ CK  __x Please pull PIC at completion of IV antibiotics   Fax weekly labs to [phone]  Clinic Follow Up Appt: In 3-4 wk  @ [facility] for Infectious Diseases Pager: [phone]  01/21/2022, 2:32 PM

## 2022-01-21 NOTE — Progress Notes (Signed)
Occupational Therapy Treatment Patient Details Name: [name] MRN: [id number] DOB: [birth year] Today's Date: 01/21/2022   History of present illness Pt is a 61 y.o. male who presented 01/14/22 with progressive pain in R knee and weakness. S/p R knee aspiration 10/14. Patient recently hospitalized 01/01/2022 through 01/06/2022 with diagnosis of sepsis due to group A streptococcus with acute renal failure in the setting of lower extremity cellulitis and swollen knee.  At the time septic arthritis was ruled out. S/p I & D to R knee on 10/16. 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 PMH: HTN, CHF, HLD, gout, CKD, afib, PAD, aortic stenosis, cellulitis, chronic back pain.   OT comments  Patient continues to make steady progress towards goals in skilled OT session. Patient's session encompassed increasing activity tolerance and education with regard to energy conservation. Patient deferring OOB activity or transfers, but willing to work on incremental scooting EOB to increase strength in preparation for transfers. Patient min A to assist RLE to EOB, then min guard for scooting. Patient verbally acknowledging education for energy conservation with good teach back noted. OT recommendation remains appropriate; will continue to follow.    Recommendations for follow up therapy are one component of a multi-disciplinary discharge planning process, led by the attending physician.  Recommendations may be updated based on patient status, additional functional criteria and insurance authorization.    Follow Up Recommendations  Home health OT    Assistance Recommended at Discharge Frequent or constant Supervision/Assistance  Patient can return home with the following  Assistance with cooking/housework;Assist for transportation;Help with stairs or ramp for entrance;A little help with walking and/or transfers;A little help with bathing/dressing/bathroom   Equipment Recommendations  BSC/3in1    Recommendations for  Other Services      Precautions / Restrictions Precautions Precautions: Fall Precaution Comments: R leg hypersensitive Restrictions Weight Bearing Restrictions: No RLE Weight Bearing: Weight bearing as tolerated Other Position/Activity Restrictions: WBAT and ROM as tolerated to R leg       Mobility Bed Mobility Overal bed mobility: Needs Assistance Bed Mobility: Supine to Sit     Supine to sit: Min assist     General bed mobility comments: extra time to come to sitting EOB, requests assistance for moving RLE toward EOB, use of bed rails PRN, HOB elevated    Transfers Overall transfer level: Needs assistance Equipment used: Rolling walker (2 wheels) Transfers: Sit to/from Stand             General transfer comment: deferred at this time     Balance Overall balance assessment: Needs assistance Sitting-balance support: No upper extremity supported, Feet supported Sitting balance-Leahy Scale: Fair Sitting balance - Comments: supervision sitting EOB                                   ADL either performed or assessed with clinical judgement   ADL Overall ADL's : Needs assistance/impaired     Grooming: Set up;Sitting                               Functional mobility during ADLs: Min guard;Rolling walker (2 wheels) General ADL Comments: Session focus on increasing activity tolerance and energy conservation strategies    Extremity/Trunk Assessment              Vision       Perception     Praxis  Cognition Arousal/Alertness: Awake/alert Behavior During Therapy: WFL for tasks assessed/performed Overall Cognitive Status: Within Functional Limits for tasks assessed                                          Exercises      Shoulder Instructions       General Comments      Pertinent Vitals/ Pain       Pain Assessment Pain Assessment: Faces Faces Pain Scale: Hurts a little bit Pain Location: R  knee Pain Descriptors / Indicators: Discomfort, Grimacing Pain Intervention(s): Limited activity within patient's tolerance, Monitored during session, Repositioned  Home Living                                          Prior Functioning/Environment              Frequency  Min 2X/week        Progress Toward Goals  OT Goals(current goals can now be found in the care plan section)  Progress towards OT goals: Progressing toward goals  Acute Rehab OT Goals Patient Stated Goal: to go home OT Goal Formulation: With patient Time For Goal Achievement: 01/31/22 Potential to Achieve Goals: Good  Plan Discharge plan remains appropriate    Co-evaluation                 AM-PAC OT "6 Clicks" Daily Activity     Outcome Measure   Help from another person eating meals?: None Help from another person taking care of personal grooming?: A Little Help from another person toileting, which includes using toliet, bedpan, or urinal?: A Lot Help from another person bathing (including washing, rinsing, drying)?: A Lot Help from another person to put on and taking off regular upper body clothing?: A Little Help from another person to put on and taking off regular lower body clothing?: A Lot 6 Click Score: 16    End of Session    OT Visit Diagnosis: Pain Pain - Right/Left: Right Pain - part of body: Knee   Activity Tolerance Patient tolerated treatment well   Patient Left in bed;with call bell/phone within reach;with bed alarm set;Other (comment) (sitting EOB)   Nurse Communication Mobility status        Time: [id number] OT Time Calculation (min): 16 min  Charges: OT General Charges $OT Visit: 1 Visit OT Treatments $Self Care/Home Management : 8-22 mins  Corinne Ports E. Joy Haegele, OTR/L Acute Rehabilitation Services [phone]   [name] 01/21/2022, 10:24 AM

## 2022-01-21 NOTE — TOC Transition Note (Addendum)
Transition of Care [facility]) - CM/SW Discharge Note   Patient Details  Name: [name] MRN: [id number] Date of Birth: 02-28-1961  Transition of Care [facility]) CM/SW Contact:  Bethena Roys, RN Phone Number: 01/21/2022, 10:07 AM   Clinical Narrative:  Patient has been educated by Ysidro Evert for IV antibiotics. [employer] did speak with significant other and she will be here to pick the patient up at 1600. Pam will get the address for the extended stay hotel. [facility] will service for RN/PT needs. Staff RN and MD aware of plan above. No further needs identified at this time.    1110 01-21-22 Patient will not transition home today- per nursing staff patient is nauseous and is in the process of giving Zofran. Per MD, ID may be changing the IV antibiotic. Case Manager did call Pam With [employer]  to make her aware. Case Manager will continue to follow over the weekend.   Final next level of care: [place] Barriers to Discharge: No Barriers Identified   Patient Goals and CMS Choice Patient states their goals for this hospitalization and ongoing recovery are:: patient may need home IV antibiotics-plan to go to an extended stay hotel. Home being rennovated. CMS Medicare.gov Compare Post Acute Care list provided to:: Patient Choice offered to / list presented to : Patient, Spouse   Discharge Plan and Services   Discharge Planning Services: CM Consult Post Acute Care Choice: Home Health          DME Arranged: IV pump/equipment ([facility])         HH Arranged: RN, Disease Management, PT, IV Antibiotics HH Agency: [facility] Date [place]: 01/20/22 Time [place]: [id number] Representative spoke with at [place]: Claiborne Billings Readmission Risk Interventions    01/19/2022    3:56 PM  Readmission Risk Prevention Plan  Transportation Screening Complete  Home Care Screening Complete  Medication Review (RN CM) Complete

## 2022-01-21 NOTE — Progress Notes (Addendum)
Physical Therapy Treatment Patient Details Name: [name] MRN: [id number] DOB: [birth year] Today's Date: 01/21/2022   History of Present Illness Pt is a 61 y.o. male who presented 01/14/22 with progressive pain in R knee and weakness. S/p R knee aspiration 10/14. Patient recently hospitalized 01/01/2022 through 01/06/2022 with diagnosis of sepsis due to group A streptococcus with acute renal failure in the setting of lower extremity cellulitis and swollen knee.  At the time septic arthritis was ruled out. S/p I & D to R knee on 10/16. 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 PMH: HTN, CHF, HLD, gout, CKD, afib, PAD, aortic stenosis, cellulitis, chronic back pain.    PT Comments    Pt only tolerated short distance of amb due to HR up to 170's and became very nauseous. Pt only requires supervision for gait and continued rt knee pain is primary barrier. Pt had not amb in two days. Encouraged pt to continue to mobilize.   Recommendations for follow up therapy are one component of a multi-disciplinary discharge planning process, led by the attending physician.  Recommendations may be updated based on patient status, additional functional criteria and insurance authorization.  Follow Up Recommendations  Home health PT     Assistance Recommended at Discharge Intermittent Supervision/Assistance  Patient can return home with the following A little help with bathing/dressing/bathroom;Assistance with cooking/housework;Assist for transportation;Help with stairs or ramp for entrance   Equipment Recommendations  None recommended by PT    Recommendations for Other Services       Precautions / Restrictions Precautions Precautions: Fall;Other (comment) Precaution Comments: watch HR Restrictions Weight Bearing Restrictions: No RLE Weight Bearing: Weight bearing as tolerated Other Position/Activity Restrictions: WBAT and ROM as tolerated to R leg     Mobility  Bed Mobility Overal bed mobility:  Needs Assistance Bed Mobility: Sit to Supine       Sit to supine: Min assist   General bed mobility comments: Assist to bring RLE back up into the bed    Transfers Overall transfer level: Needs assistance Equipment used: Rolling walker (2 wheels) Transfers: Sit to/from Stand Sit to Stand: Supervision           General transfer comment: Incr time to rise    Ambulation/Gait Ambulation/Gait assistance: Supervision Gait Distance (Feet): 35 Feet Assistive device: Rolling walker (2 wheels) Gait Pattern/deviations: Step-to pattern, Decreased stance time - right, Decreased step length - left, Decreased step length - right, Knee flexed in stance - right, Antalgic, Trunk flexed Gait velocity: significantly decreased Gait velocity interpretation: <1.31 ft/sec, indicative of household ambulator   General Gait Details: Pt with slow, antalgic gait and became nauseous with high HR (170's) so had to return to bed   Stairs             Wheelchair Mobility    Modified Rankin (Stroke Patients Only)       Balance Overall balance assessment: Needs assistance Sitting-balance support: No upper extremity supported, Feet supported Sitting balance-Leahy Scale: Normal     Standing balance support: Bilateral upper extremity supported Standing balance-Leahy Scale: Fair Standing balance comment: Uses walker for pain relief not balance                            Cognition Arousal/Alertness: Awake/alert Behavior During Therapy: WFL for tasks assessed/performed Overall Cognitive Status: Within Functional Limits for tasks assessed  Exercises      General Comments General comments (skin integrity, edema, etc.): HR erratic 110's to 170's with amb.      Pertinent Vitals/Pain Pain Assessment Pain Assessment: Faces Faces Pain Scale: Hurts worst Pain Location: R knee Pain Descriptors / Indicators: Constant,  Grimacing Pain Intervention(s): Limited activity within patient's tolerance, Repositioned, Monitored during session    Home Living                          Prior Function            PT Goals (current goals can now be found in the care plan section) Progress towards PT goals: Not progressing toward goals - comment    Frequency    Min 3X/week      PT Plan Current plan remains appropriate    Co-evaluation              AM-PAC PT "6 Clicks" Mobility   Outcome Measure  Help needed turning from your back to your side while in a flat bed without using bedrails?: None Help needed moving from lying on your back to sitting on the side of a flat bed without using bedrails?: None Help needed moving to and from a bed to a chair (including a wheelchair)?: A Little Help needed standing up from a chair using your arms (e.g., wheelchair or bedside chair)?: A Little Help needed to walk in hospital room?: A Little Help needed climbing 3-5 steps with a railing? : A Lot 6 Click Score: 19    End of Session   Activity Tolerance: Patient limited by pain;Other (comment) (high HR) Patient left: in bed;with call bell/phone within reach Nurse Communication: Mobility status;Other (comment) (high HR) PT Visit Diagnosis: Pain;Other abnormalities of gait and mobility (R26.89);Difficulty in walking, not elsewhere classified (R26.2) Pain - Right/Left: Right Pain - part of body: Knee     Time: 1046-1110 PT Time Calculation (min) (ACUTE ONLY): 24 min  Charges:  $Gait Training: 23-37 mins                     [place] Office [place] 01/21/2022, 1:04 PM

## 2022-01-21 NOTE — Progress Notes (Signed)
PHARMACY CONSULT NOTE FOR:  OUTPATIENT  PARENTERAL ANTIBIOTIC THERAPY (OPAT)  Indication: right septic knee Regimen: daptomycin 650 mg IV q24 hours End date: 02/18/2022  IV antibiotic discharge orders are pended. To discharging provider:  please sign these orders via discharge navigator,  Select New Orders & click on the button choice - Manage This Unsigned Work.   Thank you for allowing pharmacy to be a part of this patient's care.  Louanne Belton, PharmD, [facility] PGY1 Pharmacy Resident 01/21/2022 2:21 PM
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
## 2022-01-22 DIAGNOSIS — R6521 Severe sepsis with septic shock: Secondary | ICD-10-CM | POA: Diagnosis not present

## 2022-01-22 DIAGNOSIS — I1A0 Resistant hypertension: Secondary | ICD-10-CM | POA: Diagnosis not present

## 2022-01-22 DIAGNOSIS — I5032 Chronic diastolic (congestive) heart failure: Secondary | ICD-10-CM | POA: Diagnosis not present

## 2022-01-22 DIAGNOSIS — A419 Sepsis, unspecified organism: Secondary | ICD-10-CM | POA: Diagnosis not present

## 2022-01-22 DIAGNOSIS — I350 Nonrheumatic aortic (valve) stenosis: Secondary | ICD-10-CM | POA: Diagnosis not present

## 2022-01-22 LAB — CBC WITH DIFFERENTIAL/PLATELET
Abs Immature Granulocytes: 0.02 10*3/uL (ref 0.00–0.07)
Basophils Absolute: 0 10*3/uL (ref 0.0–0.1)
Basophils Relative: 0 %
Eosinophils Absolute: 0.1 10*3/uL (ref 0.0–0.5)
Eosinophils Relative: 2 %
HCT: 37.1 % — ABNORMAL LOW (ref 39.0–52.0)
Hemoglobin: 12.8 g/dL — ABNORMAL LOW (ref 13.0–17.0)
Immature Granulocytes: 1 %
Lymphocytes Relative: 27 %
Lymphs Abs: 0.9 10*3/uL (ref 0.7–4.0)
MCH: 24.1 pg — ABNORMAL LOW (ref 26.0–34.0)
MCHC: 34.5 g/dL (ref 30.0–36.0)
MCV: 69.7 fL — ABNORMAL LOW (ref 80.0–100.0)
Monocytes Absolute: 0.4 10*3/uL (ref 0.1–1.0)
Monocytes Relative: 11 %
Neutro Abs: 2.1 10*3/uL (ref 1.7–7.7)
Neutrophils Relative %: 59 %
Platelets: 143 10*3/uL — ABNORMAL LOW (ref 150–400)
RBC: 5.32 MIL/uL (ref 4.22–5.81)
RDW: 17.9 % — ABNORMAL HIGH (ref 11.5–15.5)
WBC: 3.5 10*3/uL — ABNORMAL LOW (ref 4.0–10.5)
nRBC: 2.3 % — ABNORMAL HIGH (ref 0.0–0.2)

## 2022-01-22 LAB — AEROBIC/ANAEROBIC CULTURE W GRAM STAIN (SURGICAL/DEEP WOUND)

## 2022-01-22 LAB — COMPREHENSIVE METABOLIC PANEL
ALT: 14 U/L (ref 0–44)
AST: 13 U/L — ABNORMAL LOW (ref 15–41)
Albumin: 2.3 g/dL — ABNORMAL LOW (ref 3.5–5.0)
Alkaline Phosphatase: 68 U/L (ref 38–126)
Anion gap: 10 (ref 5–15)
BUN: 16 mg/dL (ref 8–23)
CO2: 23 mmol/L (ref 22–32)
Calcium: 8.9 mg/dL (ref 8.9–10.3)
Chloride: 103 mmol/L (ref 98–111)
Creatinine, Ser: 1.33 mg/dL — ABNORMAL HIGH (ref 0.61–1.24)
GFR, Estimated: >60 mL/min (ref >60)
Glucose, Bld: 98 mg/dL (ref 70–99)
Potassium: 4 mmol/L (ref 3.5–5.1)
Sodium: 136 mmol/L (ref 135–145)
Total Bilirubin: 0.6 mg/dL (ref 0.3–1.2)
Total Protein: 6.8 g/dL (ref 6.5–8.1)

## 2022-01-22 LAB — BASIC METABOLIC PANEL
Anion gap: 5 (ref 5–15)
BUN: 16 mg/dL (ref 8–23)
CO2: 26 mmol/L (ref 22–32)
Calcium: 8.8 mg/dL — ABNORMAL LOW (ref 8.9–10.3)
Chloride: 104 mmol/L (ref 98–111)
Creatinine, Ser: 1.42 mg/dL — ABNORMAL HIGH (ref 0.61–1.24)
GFR, Estimated: 56 mL/min — ABNORMAL LOW (ref >60)
Glucose, Bld: 97 mg/dL (ref 70–99)
Potassium: 3.8 mmol/L (ref 3.5–5.1)
Sodium: 135 mmol/L (ref 135–145)

## 2022-01-22 LAB — CBC
HCT: 25.2 % — ABNORMAL LOW (ref 39.0–52.0)
Hemoglobin: 8.6 g/dL — ABNORMAL LOW (ref 13.0–17.0)
MCH: 24.2 pg — ABNORMAL LOW (ref 26.0–34.0)
MCHC: 34.1 g/dL (ref 30.0–36.0)
MCV: 71 fL — ABNORMAL LOW (ref 80.0–100.0)
Platelets: 228 10*3/uL (ref 150–400)
RBC: 3.55 MIL/uL — ABNORMAL LOW (ref 4.22–5.81)
RDW: 16.9 % — ABNORMAL HIGH (ref 11.5–15.5)
WBC: 5.4 10*3/uL (ref 4.0–10.5)
nRBC: 0.6 % — ABNORMAL HIGH (ref 0.0–0.2)

## 2022-01-22 LAB — MAGNESIUM
Magnesium: 1.4 mg/dL — ABNORMAL LOW (ref 1.7–2.4)
Magnesium: 2.1 mg/dL (ref 1.7–2.4)

## 2022-01-22 LAB — PHOSPHORUS: Phosphorus: 3.4 mg/dL (ref 2.5–4.6)

## 2022-01-22 LAB — BRAIN NATRIURETIC PEPTIDE: B Natriuretic Peptide: 407.4 pg/mL — ABNORMAL HIGH (ref 0.0–100.0)

## 2022-01-22 MED ORDER — MAGNESIUM SULFATE 4 GM/100ML IV SOLN
4.0000 g | Freq: Once | INTRAVENOUS | Status: AC
  Administered 2022-01-22: 4 g via INTRAVENOUS
  Filled 2022-01-22: qty 100

## 2022-01-22 MED ORDER — MAGNESIUM SULFATE 2 GM/50ML IV SOLN
2.0000 g | Freq: Once | INTRAVENOUS | Status: AC
  Administered 2022-01-22: 2 g via INTRAVENOUS
  Filled 2022-01-22: qty 50

## 2022-01-22 NOTE — Progress Notes (Signed)
Rounding Note    Patient Name: [name] Date of Encounter: 01/22/2022  [place] Cardiologist: Donato Heinz, MD   Subjective   Denies any CP or SOB.   Inpatient Medications    Scheduled Meds:  allopurinol  100 mg Oral Daily   apixaban  5 mg Oral BID   aspirin  81 mg Oral Daily   atorvastatin  20 mg Oral Daily   carvedilol  25 mg Oral BID WC   Chlorhexidine Gluconate Cloth  6 each Topical Daily   cloNIDine  0.2 mg Oral TID   dapagliflozin propanediol  10 mg Oral Daily   ferrous sulfate  325 mg Oral QODAY   furosemide  40 mg Oral Daily   hydrALAZINE  50 mg Oral Q8H   irbesartan  300 mg Oral Daily   sodium chloride flush  10-40 mL Intracatheter Q12H   Continuous Infusions:  sodium chloride Stopped (01/19/22 1511)   DAPTOmycin (CUBICIN) 650 mg in sodium chloride 0.9 % IVPB 650 mg (01/21/22 1457)   PRN Meds: sodium chloride, acetaminophen **OR** acetaminophen, albuterol, fentaNYL (SUBLIMAZE) injection, hydrOXYzine, labetalol, ondansetron **OR** ondansetron (ZOFRAN) IV, oxyCODONE, sodium chloride flush   Vital Signs    Vitals:   01/21/22 2034 01/22/22 0351 01/22/22 0428 01/22/22 1100  BP: 113/73 (!) 127/95  100/69  Pulse: 82 84  77  Resp: 19 19  16   Temp: 98.7 F (37.1 C) 98.5 F (36.9 C)  97.7 F (36.5 C)  TempSrc: Oral Oral  Oral  SpO2: 97% 95%    Weight:   86.6 kg   Height:        Intake/Output Summary (Last 24 hours) at 01/22/2022 1229 Last data filed at 01/22/2022 1100 Gross per 24 hour  Intake 1093.27 ml  Output 1800 ml  Net -706.73 ml       01/22/2022    4:28 AM 01/19/2022    5:53 AM 01/18/2022    5:29 AM  Last 3 Weights  Weight (lbs) 190 lb 14.4 oz 208 lb 15.9 oz 204 lb 9.4 oz  Weight (kg) 86.592 kg 94.8 kg 92.8 kg      Telemetry    Atrial fibrillation with HR 70-80s, NSVT up to 5 beats- Personally Reviewed  ECG    No new ECG - Personally Reviewed  Physical Exam   GEN: No acute distress.   Neck: No  JVD Cardiac: irregularly irregular, no murmurs, rubs, or gallops.  Respiratory: Clear to auscultation bilaterally. GI: Soft, nontender, non-distended  MS: No edema; No deformity. Neuro:  Nonfocal  Psych: Normal affect   Labs    High Sensitivity Troponin:   Recent Labs  Lab 01/13/22 0424 01/14/22 2128 01/15/22 0150  TROPONINIHS 16 29* 29*      Chemistry Recent Labs  Lab 01/20/22 0159 01/21/22 0500 01/22/22 0037 01/22/22 0900  NA 139 137  --  136  K 4.2 4.1  --  4.0  CL 113* 107  --  103  CO2 21* 24  --  23  GLUCOSE 106* 99  --  98  BUN 20 14  --  16  CREATININE 1.34* 1.32*  --  1.33*  CALCIUM 8.6* 9.0  --  8.9  MG 1.7 1.5* 1.4* 2.1  PROT 6.2* 6.5  --  6.8  ALBUMIN 2.2* 2.2*  --  2.3*  AST 15 17  --  13*  ALT 17 16  --  14  ALKPHOS 57 60  --  68  BILITOT 0.6 0.5  --  0.6  GFRNONAA 60* >60  --  >60  ANIONGAP 5 6  --  10     Lipids No results for input(s): "CHOL", "TRIG", "HDL", "LABVLDL", "LDLCALC", "CHOLHDL" in the last 168 hours.  Hematology Recent Labs  Lab 01/20/22 0159 01/21/22 0500 01/22/22 0900  WBC 7.0 5.9 3.5*  RBC 3.29* 3.35* 5.32  HGB 7.8* 8.0* 12.8*  HCT 23.1* 24.0* 37.1*  MCV 70.2* 71.6* 69.7*  MCH 23.7* 23.9* 24.1*  MCHC 33.8 33.3 34.5  RDW 16.0* 16.9* 17.9*  PLT 196 219 143*    Thyroid No results for input(s): "TSH", "FREET4" in the last 168 hours.  BNP Recent Labs  Lab 01/18/22 0147 01/19/22 0211 01/22/22 0900  BNP 669.1* 478.3* 407.4*     DDimer No results for input(s): "DDIMER" in the last 168 hours.   Radiology    DG CHEST PORT 1 VIEW  Result Date: 01/21/2022 CLINICAL DATA:  Shortness of breath EXAM: PORTABLE CHEST 1 VIEW COMPARISON:  01/20/2022 FINDINGS: Unchanged position of right arm PICC with tip in the upper SVC. Unchanged cardiac and mediastinal contours. No focal pulmonary opacity. No pleural effusion or pneumothorax. No acute osseous abnormality. IMPRESSION: No acute cardiopulmonary process. Electronically Signed    By: Wiliam Ke M.D.   On: 01/21/2022 13:41    Cardiac Studies   Cath 05/27/2021   Ost RCA to Prox RCA lesion is 40% stenosed.   Prox RCA to Mid RCA lesion is 20% stenosed.   RPDA lesion is 30% stenosed.   RPAV lesion is 50% stenosed.   Dist Cx lesion is 50% stenosed.   2nd Diag lesion is 90% stenosed.   Prox LAD to Mid LAD lesion is 30% stenosed.   Dist LAD lesion is 90% stenosed.   The LAD is a large caliber vessel that courses to the apex. There is mild non-obstructive disease in the proximal and mid LAD. The apical LAD has diffuse severe stenosis. The first Diagonal is a moderate caliber vessel with mild non-obstructive disease. The second Diagonal branch is a moderate caliber vessel with a focal severe stenosis The Circumflex is a large caliber vessel with moderate distal stenosis The RCA is a large dominant artery with mild eccentric proximal stenosis. The PDA and posterolateral arteries have moderate diffuse disease.  Severe aortic stenosis by echo. The aortic valve was not crossed today.    RA 14 RV 42/12/19 PA 44/22 (mean 27) PCWP 19 AO 103/64   Will continue workup for AVR. Given his severe LVH and young age, he may be better treated with surgical AVR than TAVR. Will review with our valve team and make arrangements for CT scans if felt necessary before being seen by our CT surgeon.    Echo 12/28/2021 1. Left ventricular ejection fraction, by estimation, is 60 to 65%. The  left ventricle has normal function. The left ventricle has no regional  wall motion abnormalities. There is severe concentric left ventricular  hypertrophy. Left ventricular diastolic   function could not be evaluated. Elevated left atrial pressure.   2. Right ventricular systolic function is normal. The right ventricular  size is normal. There is mildly elevated pulmonary artery systolic  pressure.   3. Left atrial size was severely dilated.   4. Right atrial size was mildly dilated.   5. The  mitral valve is normal in structure. Mild mitral valve  regurgitation. No evidence of mitral stenosis.   6. The aortic valve is tricuspid. Aortic valve regurgitation is mild.  Moderate to  severe aortic valve stenosis.   7. Aortic dilatation noted. There is mild dilatation of the ascending  aorta, measuring 40 mm.   8. The inferior vena cava is normal in size with greater than 50%  respiratory variability, suggesting right atrial pressure of 3 mmHg.   Patient Profile     61 y.o. male with PMH of chronic diastolic CHF, resistant HTN, moderate to severe AS, PAF, HLD and PAD who had recent S pyogenes bacteremia and R leg cellulitis and is now admitted with sepsis secondary to R septic arthritis of knee. Cardiology consulted for afib with RVR since 01/16/2022.   Assessment & Plan    Persistent afib with RVR  - in the setting of sepsis  - IV cardizem weaned off, now on coreg 25mg  BID for rate control. Appears well rate controlled  - plan for biatrial Maze during AVR  - Eliquis was held for bleeding at wound site, has been restarted  Severe aortic stenosis  - Echo 12/28/2021 EF 60-65%, moderate to severe AS, dilated ascending aorta at 40 mm, aortic valve mean gradient 29 mmHg - patient was previously scheduled for AVR in 07/2021, surgery delayed due to leg wound. Seen [name] on 12/23/2021 with plan to repeat echo (done on 12/28/2021), then schedule AVR with bioprosthetic valve and CABG to diagonal, biatrial Maze   Chronic diastolic CHF: restarted on farxiga. On 40mg  TID of lasix at home (although home med says 60mg  TID, however patient has only been taking 40mg  TID dosing), held given AKI. Cr down to 1.32 which is his baseline. Patient remains euvolemic. Restarted lasix at 40 mg daily  Resistant HTN: clonidine, hydralazine, coreg and olmesartan at home. Have restarted home meds, BP appears controlled.  Septic arthritis of R knee: underwent I&D on 10/16. ID consulted on 10/18, directing abx  usage.   Acute on CKD: Cr on arrival 2.4, now down to 1.3 which is his baseline.   CAD: see cath report 05/27/2021      For questions or updates, please contact [place] Please consult [email or web address] for contact info under        Signed, Donato Heinz, MD  01/22/2022, 12:29 PM

## 2022-01-22 NOTE — Progress Notes (Signed)
Pt's Magnesium level on 10/20 was 1.5; pt then had a 4-beat-run of V-tach @ 6579 on 10/21; pt is asymptomatic, resting comfortably. Marlowe Sax, MD paged w/ verbal orders to draw morning labs. Will continue to monitor.   Elaina Hoops, RN

## 2022-01-22 NOTE — Progress Notes (Signed)
PROGRESS NOTE    [name]  [id number] DOB: 1960-08-22 DOA: 01/14/2022 PCP: [name], MD  Chief Complaint  Patient presents with   Hypotension    Brief Narrative:  [name] is [name] 61 y.o. male with [name] history of severe AS, HFpEF, AFib on eliquis, stage IIIa CKD, HTN, HLD, OSA not on CPAP who presented for the 2nd time in 2 days on 10/13 with increasing right knee pain. He had been admitted 9/30 with group Jahree Dermody streptococcal bacteremia, ARF and RLE cellulitis as well as suspected gout of the right knee treated with steroid injection. With negative echo, penicillin and linezolid were given with improvement, transitioned to amoxicillin for 14 days per ID and discharged 10/5. He returned ultimately 10/13 with worsening pain and lethargy. He was septic with hypotension responsive to resuscitation, leukocytosis (WBC 14), AKI (SCr 2.4 from 1.5). IV antibiotics given, cultures drawn, and ultimately MR right knee revealed mod-large complex effusion, synovial thickening, lateral femoral condyle focal erosion, minimal adjacent marrow edema, extensive muscular edema, quadriceps tendinosis with partial-thickness tear. 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   Assessment & Plan:   Principal Problem:   Severe sepsis with septic shock (HCC)   Assessment and Plan: Nausea  Dyspnea on Exertion - significant nausea/DOE when up today, improved now that he's back in bed - CXR without acute pulm process - lasix restarted per cards, BNP elevated - ? If symptoms related to aortic stenosis - appreciate cardiology assistance  Severe sepsis with AKI  Recent Group Shannon Kirkendall Strep Bacteremia and Right Leg Cellulitis  Presumed Septic Arthritis of R Knee: Don't believe septic shock is correct since hypotension was responsive to IV fluids.  - With recent history of S. pyogenes bacteremia, plan was oral amoxicillin 1g q8h through 10/15. Will request ID input  regarding this.  - appreciate ID recs, recommending treatment for presumed septic arthritis of R knee -> PICC, IV daptomycin  - now with growth from surgical specimens as noted below, will discuss again with ID - Monitor blood cultures, NGTD. - Pt to continue coverage inclusive of group Liat Mayol strep per recent bacteremia.    CoNS Septic Arthritis of Right Knee  Tophaceous Gout Flare - s/p arthrotomy R knee with closure over drain and application of biologic graft using kerecis to the right leg per orthopedics 10/16. 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  - rare staph cohnii from synovial tissue, staph auricularis -> abx changed to daptomycin x4 weeks - will start allopurinol with crystals seen on synovial fluid  - Adjustments to pain control, change to oxycodone as he tolerated this better in the past. Also can give IV fentanyl (least histaminergic) prn.    AKI on stage IIIa CKD:  - Avoid nephrotoxins.    Persistent AFib with RVR: Rate stabilized - Continue coreg, cardizem - eliquis resumed this morning   HTN, chronic HFpEF, aortic stenosis: - Coreg 25mg  po BID, irbesartan 30mg  daily, diltiazem 30mg  q6h, clonidine 0.2 mg TID, hydralazine 50 mg q8 - appreciate cardiology assistance  - lasix restarted per cards - BNP down from prior, required IVF resuscitation, will avoid hypotension and monitor I/O, volume status closely  - Follow up with [name] after all infection has been treated to pursue AVR. Also discussing MAZE.    CAD: No angina. Plan per cardiology.    HLD:  - Resume statin   OSA:  - Nocturnal O2, pt not on CPAP  DVT prophylaxis: eliquis Code Status: full Family Communication: family at bedside Disposition:   Status is: Inpatient Remains inpatient appropriate because: need for continued inpatient care   Consultants:  ID Cardiology orthpedics  Procedures:    Antimicrobials:  Anti-infectives (From admission, onward)    Start      Dose/Rate Route Frequency Ordered Stop   01/21/22 1200  DAPTOmycin (CUBICIN) 650 mg in sodium chloride 0.9 % IVPB        8 mg/kg  80.3 kg (Adjusted) 126 mL/hr over 30 Minutes Intravenous Daily 01/21/22 1103     01/20/22 0000  penicillin G IVPB  Status:  Discontinued        24 Million Units Intravenous Every 24 hours 01/20/22 1355 01/21/22    01/19/22 2200  penicillin G potassium 12 Million Units in dextrose 5 % 500 mL continuous infusion  Status:  Discontinued        12 Million Units 41.7 mL/hr over 12 Hours Intravenous Every 12 hours 01/19/22 1151 01/21/22 1103   01/16/22 1400  vancomycin (VANCOCIN) IVPB 1000 mg/200 mL premix  Status:  Discontinued        1,000 mg 200 mL/hr over 60 Minutes Intravenous Every 24 hours 01/16/22 1219 01/19/22 1151   01/15/22 1200  vancomycin (VANCOREADY) IVPB 1500 mg/300 mL        1,500 mg 150 mL/hr over 120 Minutes Intravenous  Once 01/15/22 0157 01/15/22 1353   01/15/22 1000  ceFEPIme (MAXIPIME) 2 g in sodium chloride 0.9 % 100 mL IVPB  Status:  Discontinued        2 g 200 mL/hr over 30 Minutes Intravenous Every 12 hours 01/15/22 0138 01/19/22 1151   01/15/22 0157  vancomycin variable dose per unstable renal function (pharmacist dosing)  Status:  Discontinued         Does not apply See admin instructions 01/15/22 0157 01/16/22 1220   01/14/22 2200  ceFEPIme (MAXIPIME) 2 g in sodium chloride 0.9 % 100 mL IVPB        2 g 200 mL/hr over 30 Minutes Intravenous  Once 01/14/22 2149 01/14/22 2311   01/14/22 2200  linezolid (ZYVOX) IVPB 600 mg        600 mg 300 mL/hr over 60 Minutes Intravenous Once 01/14/22 2149 01/15/22 0249       Subjective: Feels ok today Had some nausea this morning  Objective: Vitals:   01/22/22 0351 01/22/22 0428 01/22/22 1100 01/22/22 1507  BP: (!) 127/95  100/69 107/79  Pulse: 84  77   Resp: 19  16 (!) 27  Temp: 98.5 F (36.9 C)  97.7 F (36.5 C)   TempSrc: Oral  Oral   SpO2: 95%     Weight:  86.6 kg    Height:         Intake/Output Summary (Last 24 hours) at 01/22/2022 1619 Last data filed at 01/22/2022 1500 Gross per 24 hour  Intake 1094.27 ml  Output 1200 ml  Net -105.73 ml   Filed Weights   01/18/22 0529 01/19/22 0553 01/22/22 0428  Weight: 92.8 kg 94.8 kg 86.6 kg    Examination:  General: No acute distress. Cardiovascular: Heart sounds show Khalia Gong regular rate, and rhythm. No gallops or rubs. No murmurs. No JVD. Lungs: Clear to auscultation bilaterally with good air movement. No rales, rhonchi or wheezes. Abdomen: Soft, nontender, nondistended with normal active bowel sounds. No masses. No hepatosplenomegaly. Neurological: Alert and oriented 3. Moves all extremities 4 with equal strength. Cranial nerves II through XII grossly  intact. Skin: Warm and dry. No rashes or lesions. Extremities: dressing to R knee intact   Data Reviewed: I have personally reviewed following labs and imaging studies  CBC: Recent Labs  Lab 01/18/22 0147 01/19/22 0211 01/20/22 0159 01/21/22 0500 01/22/22 0900 01/22/22 1500  WBC 5.9 6.3 7.0 5.9 3.5* 5.4  NEUTROABS 4.5 4.2 4.1 3.1 2.1  --   HGB 8.3* 7.9* 7.8* 8.0* 12.8* 8.6*  HCT 24.8* 24.1* 23.1* 24.0* 37.1* 25.2*  MCV 71.5* 70.9* 70.2* 71.6* 69.7* 71.0*  PLT 173 187 196 219 143* [id number]    Basic Metabolic Panel: Recent Labs  Lab 01/16/22 0253 01/17/22 0156 01/19/22 0211 01/20/22 0159 01/21/22 0500 01/22/22 0037 01/22/22 0900 01/22/22 1500  NA 137   < > 139 139 137  --  136 135  K 3.8   < > 4.0 4.2 4.1  --  4.0 3.8  CL 108   < > 111 113* 107  --  103 104  CO2 20*   < > 21* 21* 24  --  23 26  GLUCOSE 96   < > 165* 106* 99  --  98 97  BUN 22   < > 24* 20 14  --  16 16  CREATININE 1.62*   < > 1.46* 1.34* 1.32*  --  1.33* 1.42*  CALCIUM 8.9   < > 8.9 8.6* 9.0  --  8.9 8.8*  MG 1.9   < > 2.1 1.7 1.5* 1.4* 2.1  --   PHOS 3.1  --   --  2.4* 2.8  --  3.4  --    < > = values in this interval not displayed.    GFR: Estimated Creatinine Clearance:  58.8 mL/min (Deretha Ertle) (by C-G formula based on SCr of 1.42 mg/dL (H)).  Liver Function Tests: Recent Labs  Lab 01/18/22 0147 01/19/22 0211 01/20/22 0159 01/21/22 0500 01/22/22 0900  AST 16 18 15 17  13*  ALT 14 18 17 16 14   ALKPHOS 57 61 57 60 68  BILITOT 0.7 0.6 0.6 0.5 0.6  PROT 6.7 6.4* 6.2* 6.5 6.8  ALBUMIN 2.2* 2.1* 2.2* 2.2* 2.3*    CBG: No results for input(s): "GLUCAP" in the last 168 hours.    Recent Results (from the past 240 hour(s))  Blood Culture (routine x 2)     Status: None   Collection Time: 01/14/22 10:11 PM   Specimen: BLOOD  Result Value Ref [place] Status   Specimen Description BLOOD BLOOD RIGHT ARM  Final   Special Requests   Final    BOTTLES DRAWN AEROBIC AND ANAEROBIC Blood Culture results may not be optimal due to an inadequate volume of blood received in culture bottles   Culture   Final    NO GROWTH 5 DAYS Performed at [facility], [place] [address], [place], [place] [address]    Report Status 01/19/2022 FINAL  Final  Body fluid culture w Gram Stain     Status: None   Collection Time: 01/15/22  3:08 PM   Specimen: Body Fluid  Result Value Ref [place] Status   Specimen Description FLUID  Final   Special Requests RIGHT KNEE ASPIRATION  Final   Gram Stain   Final    MODERATE WBC PRESENT, PREDOMINANTLY PMN NO ORGANISMS SEEN    Culture   Final    NO GROWTH 3 DAYS Performed at [facility], [address] [place], [place] [address]    Report Status 01/19/2022 FINAL  Final  Respiratory (~20 pathogens) panel by PCR     Status: None   Collection Time: 01/16/22 12:03 AM   Specimen: Nasopharyngeal Swab; Respiratory  Result Value Ref [place] Status   Adenovirus NOT DETECTED NOT DETECTED Final   Coronavirus 229E NOT DETECTED NOT DETECTED Final    Comment: (NOTE) The Coronavirus on the Respiratory Panel, DOES NOT test for the novel  Coronavirus (2019 nCoV)    Coronavirus HKU1 NOT DETECTED NOT DETECTED Final   Coronavirus NL63 NOT DETECTED NOT  DETECTED Final   Coronavirus OC43 NOT DETECTED NOT DETECTED Final   Metapneumovirus NOT DETECTED NOT DETECTED Final   Rhinovirus / Enterovirus NOT DETECTED NOT DETECTED Final   Influenza Lilyonna Steidle NOT DETECTED NOT DETECTED Final   Influenza B NOT DETECTED NOT DETECTED Final   Parainfluenza Virus 1 NOT DETECTED NOT DETECTED Final   Parainfluenza Virus 2 NOT DETECTED NOT DETECTED Final   Parainfluenza Virus 3 NOT DETECTED NOT DETECTED Final   Parainfluenza Virus 4 NOT DETECTED NOT DETECTED Final   Respiratory Syncytial Virus NOT DETECTED NOT DETECTED Final   Bordetella pertussis NOT DETECTED NOT DETECTED Final   Bordetella Parapertussis NOT DETECTED NOT DETECTED Final   Chlamydophila pneumoniae NOT DETECTED NOT DETECTED Final   Mycoplasma pneumoniae NOT DETECTED NOT DETECTED Final    Comment: Performed at [facility], [place]. [address] [place], [place] [address]  Aerobic/Anaerobic Culture w Gram Stain (surgical/deep wound)     Status: None   Collection Time: 01/17/22  9:18 AM   Specimen: Synovial, Right Knee; Body Fluid  Result Value Ref [place] Status   Specimen Description KNEE  Final   Special Requests  RIGHT, JOINT FLUID  Final   Gram Stain   Final    MODERATE WBC PRESENT,BOTH PMN AND MONONUCLEAR NO ORGANISMS SEEN    Culture   Final    RARE STAPHYLOCOCCUS AURICULARIS NO ANAEROBES ISOLATED Performed at [facility], [address], [place], [place] [address]    Report Status 01/22/2022 FINAL  Final   Organism ID, Bacteria STAPHYLOCOCCUS AURICULARIS  Final      Susceptibility   Staphylococcus auricularis - MIC*    CIPROFLOXACIN <=0.5 SENSITIVE Sensitive     ERYTHROMYCIN 0.5 SENSITIVE Sensitive     GENTAMICIN <=0.5 SENSITIVE Sensitive     OXACILLIN RESISTANT Resistant     TETRACYCLINE <=1 SENSITIVE Sensitive     VANCOMYCIN 1 SENSITIVE Sensitive     TRIMETH/SULFA <=10 SENSITIVE Sensitive     CLINDAMYCIN <=0.25 SENSITIVE Sensitive     RIFAMPIN <=0.5 SENSITIVE Sensitive      Inducible Clindamycin NEGATIVE Sensitive     * RARE STAPHYLOCOCCUS AURICULARIS  Aerobic/Anaerobic Culture w Gram Stain (surgical/deep wound)     Status: None   Collection Time: 01/17/22  9:21 AM   Specimen: Synovial, Right Knee; Tissue  Result Value Ref [place] Status   Specimen Description TISSUE  Final   Special Requests  RIGHT KNEE  Final   Gram Stain   Final    FEW WBC PRESENT, PREDOMINANTLY PMN NO ORGANISMS SEEN    Culture   Final    RARE STAPHYLOCOCCUS COHNII NO ANAEROBES ISOLATED Performed at [facility], [address], [place], [place] [address]    Report Status 01/22/2022 FINAL  Final   Organism ID, Bacteria STAPHYLOCOCCUS COHNII  Final      Susceptibility   Staphylococcus cohnii - MIC*    CIPROFLOXACIN <=0.5 SENSITIVE Sensitive     ERYTHROMYCIN >=8 RESISTANT Resistant  GENTAMICIN <=0.5 SENSITIVE Sensitive     OXACILLIN 2 RESISTANT Resistant     TETRACYCLINE >=16 RESISTANT Resistant     VANCOMYCIN 1 SENSITIVE Sensitive     TRIMETH/SULFA 80 RESISTANT Resistant     CLINDAMYCIN RESISTANT Resistant     RIFAMPIN <=0.5 SENSITIVE Sensitive     Inducible Clindamycin POSITIVE Resistant     * RARE STAPHYLOCOCCUS COHNII         Radiology Studies: DG CHEST PORT 1 VIEW  Result Date: 01/21/2022 CLINICAL DATA:  Shortness of breath EXAM: PORTABLE CHEST 1 VIEW COMPARISON:  01/20/2022 FINDINGS: Unchanged position of right arm PICC with tip in the upper SVC. Unchanged cardiac and mediastinal contours. No focal pulmonary opacity. No pleural effusion or pneumothorax. No acute osseous abnormality. IMPRESSION: No acute cardiopulmonary process. Electronically Signed   By: Merilyn Baba M.D.   On: 01/21/2022 13:41        Scheduled Meds:  allopurinol  100 mg Oral Daily   apixaban  5 mg Oral BID   aspirin  81 mg Oral Daily   atorvastatin  20 mg Oral Daily   carvedilol  25 mg Oral BID WC   Chlorhexidine Gluconate Cloth  6 each Topical Daily   cloNIDine  0.2 mg Oral TID    dapagliflozin propanediol  10 mg Oral Daily   ferrous sulfate  325 mg Oral QODAY   furosemide  40 mg Oral Daily   hydrALAZINE  50 mg Oral Q8H   irbesartan  300 mg Oral Daily   sodium chloride flush  10-40 mL Intracatheter Q12H   Continuous Infusions:  sodium chloride Stopped (01/19/22 1511)   DAPTOmycin (CUBICIN) 650 mg in sodium chloride 0.9 % IVPB 650 mg (01/21/22 1457)     LOS: 7 days    Time spent: over 30 min    Fayrene Helper, MD [facility]   To contact the attending provider between 7A-7P or the covering provider during after hours 7P-7A, please log into the web site [email or web address] and access using universal [place] password for that web site. If you do not have the password, please call the hospital operator.  01/22/2022, 4:19 PM

## 2022-01-23 DIAGNOSIS — I5032 Chronic diastolic (congestive) heart failure: Secondary | ICD-10-CM | POA: Diagnosis not present

## 2022-01-23 DIAGNOSIS — R6521 Severe sepsis with septic shock: Secondary | ICD-10-CM | POA: Diagnosis not present

## 2022-01-23 DIAGNOSIS — I1A0 Resistant hypertension: Secondary | ICD-10-CM | POA: Diagnosis not present

## 2022-01-23 DIAGNOSIS — I350 Nonrheumatic aortic (valve) stenosis: Secondary | ICD-10-CM | POA: Diagnosis not present

## 2022-01-23 DIAGNOSIS — A419 Sepsis, unspecified organism: Secondary | ICD-10-CM | POA: Diagnosis not present

## 2022-01-23 LAB — COMPREHENSIVE METABOLIC PANEL
ALT: 13 U/L (ref 0–44)
AST: 14 U/L — ABNORMAL LOW (ref 15–41)
Albumin: 2.3 g/dL — ABNORMAL LOW (ref 3.5–5.0)
Alkaline Phosphatase: 67 U/L (ref 38–126)
Anion gap: 9 (ref 5–15)
BUN: 21 mg/dL (ref 8–23)
CO2: 24 mmol/L (ref 22–32)
Calcium: 8.9 mg/dL (ref 8.9–10.3)
Chloride: 104 mmol/L (ref 98–111)
Creatinine, Ser: 1.59 mg/dL — ABNORMAL HIGH (ref 0.61–1.24)
GFR, Estimated: 49 mL/min — ABNORMAL LOW (ref >60)
Glucose, Bld: 93 mg/dL (ref 70–99)
Potassium: 4 mmol/L (ref 3.5–5.1)
Sodium: 137 mmol/L (ref 135–145)
Total Bilirubin: 0.6 mg/dL (ref 0.3–1.2)
Total Protein: 6.7 g/dL (ref 6.5–8.1)

## 2022-01-23 LAB — CBC WITH DIFFERENTIAL/PLATELET
Abs Immature Granulocytes: 0.04 10*3/uL (ref 0.00–0.07)
Basophils Absolute: 0 10*3/uL (ref 0.0–0.1)
Basophils Relative: 1 %
Eosinophils Absolute: 0.1 10*3/uL (ref 0.0–0.5)
Eosinophils Relative: 2 %
HCT: 25 % — ABNORMAL LOW (ref 39.0–52.0)
Hemoglobin: 8.1 g/dL — ABNORMAL LOW (ref 13.0–17.0)
Immature Granulocytes: 1 %
Lymphocytes Relative: 30 %
Lymphs Abs: 1.7 10*3/uL (ref 0.7–4.0)
MCH: 23.5 pg — ABNORMAL LOW (ref 26.0–34.0)
MCHC: 32.4 g/dL (ref 30.0–36.0)
MCV: 72.5 fL — ABNORMAL LOW (ref 80.0–100.0)
Monocytes Absolute: 0.5 10*3/uL (ref 0.1–1.0)
Monocytes Relative: 9 %
Neutro Abs: 3.2 10*3/uL (ref 1.7–7.7)
Neutrophils Relative %: 57 %
Platelets: 229 10*3/uL (ref 150–400)
RBC: 3.45 MIL/uL — ABNORMAL LOW (ref 4.22–5.81)
RDW: 17.1 % — ABNORMAL HIGH (ref 11.5–15.5)
WBC: 5.5 10*3/uL (ref 4.0–10.5)
nRBC: 0.7 % — ABNORMAL HIGH (ref 0.0–0.2)

## 2022-01-23 LAB — PHOSPHORUS: Phosphorus: 3.6 mg/dL (ref 2.5–4.6)

## 2022-01-23 LAB — MAGNESIUM: Magnesium: 2.1 mg/dL (ref 1.7–2.4)

## 2022-01-23 NOTE — Progress Notes (Signed)
PROGRESS NOTE    Yoshiharu Staebell  [id number] DOB: [birth year] DOA: 01/14/2022 PCP: [name], MD  Chief Complaint  Patient presents with   Hypotension    Brief Narrative:  Mendel Romeo is Derec Mozingo 61 y.o. male with Edythe Riches history of severe AS, HFpEF, AFib on eliquis, stage IIIa CKD, HTN, HLD, OSA not on CPAP who presented for the 2nd time in 2 days on 10/13 with increasing right knee pain. He had been admitted 9/30 with group Rickelle Sylvestre streptococcal bacteremia, ARF and RLE cellulitis as well as suspected gout of the right knee treated with steroid injection. With negative echo, penicillin and linezolid were given with improvement, transitioned to amoxicillin for 14 days per ID and discharged 10/5. He returned ultimately 10/13 with worsening pain and lethargy. He was septic with hypotension responsive to resuscitation, leukocytosis (WBC 14), AKI (SCr 2.4 from 1.5). IV antibiotics given, cultures drawn, and ultimately MR right knee revealed mod-large complex effusion, synovial thickening, lateral femoral condyle focal erosion, minimal adjacent marrow edema, extensive muscular edema, quadriceps tendinosis with partial-thickness tear. 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   Assessment & Plan:   Principal Problem:   Severe sepsis with septic shock (HCC)   Assessment and Plan: Nausea  Dyspnea on Exertion - significant nausea/DOE when up today, improved now that he's back in bed - CXR without acute pulm process - lasix restarted per cards, BNP elevated - ? If symptoms related to aortic stenosis - appreciate cardiology assistance - overall seems improved  Severe sepsis with AKI  Recent Group Aarvi Stotts Strep Bacteremia and Right Leg Cellulitis  Presumed Septic Arthritis of R Knee: Don't believe septic shock is correct since hypotension was responsive to IV fluids.  - With recent history of S. pyogenes bacteremia, plan was oral amoxicillin 1g q8h through  10/15. Will request ID input regarding this.  - appreciate ID recs, recommending treatment for presumed septic arthritis of R knee -> PICC, IV daptomycin  - now with growth from surgical specimens as noted below, will discuss again with ID - Monitor blood cultures, NGTD. - Pt to continue coverage inclusive of group Anthoni Geerts strep per recent bacteremia.    CoNS Septic Arthritis of Right Knee  Tophaceous Gout Flare - s/p arthrotomy R knee with closure over drain and application of biologic graft using kerecis to the right leg per orthopedics 10/16. 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  - rare staph cohnii from synovial tissue, staph auricularis -> abx changed to daptomycin x4 weeks - will start allopurinol with crystals seen on synovial fluid  - Adjustments to pain control, change to oxycodone as he tolerated this better in the past. Also can give IV fentanyl (least histaminergic) prn.    AKI on stage IIIa CKD:  - Avoid nephrotoxins.    Persistent AFib with RVR: Rate stabilized - Continue coreg, cardizem - eliquis resumed this morning   HTN, chronic HFpEF, aortic stenosis: - Coreg 25mg  po BID, irbesartan 30mg  daily, diltiazem 30mg  q6h, clonidine 0.2 mg TID, hydralazine 50 mg q8 - appreciate cardiology assistance  - lasix restarted per cards - BNP down from prior, required IVF resuscitation, will avoid hypotension and monitor I/O, volume status closely  - Follow up with [name] after all infection has been treated to pursue AVR. Also discussing MAZE.    CAD: No angina. Plan per cardiology.    HLD:  - Resume statin   OSA:  - Nocturnal O2, pt  not on CPAP    DVT prophylaxis: eliquis Code Status: full Family Communication: family at bedside Disposition:   Status is: Inpatient Remains inpatient appropriate because: need for continued inpatient care   Consultants:  ID Cardiology orthpedics  Procedures:    Antimicrobials:  Anti-infectives (From  admission, onward)    Start     Dose/Rate Route Frequency Ordered Stop   01/21/22 1200  DAPTOmycin (CUBICIN) 650 mg in sodium chloride 0.9 % IVPB        8 mg/kg  80.3 kg (Adjusted) 126 mL/hr over 30 Minutes Intravenous Daily 01/21/22 1103     01/20/22 0000  penicillin G IVPB  Status:  Discontinued        24 Million Units Intravenous Every 24 hours 01/20/22 1355 01/21/22    01/19/22 2200  penicillin G potassium 12 Million Units in dextrose 5 % 500 mL continuous infusion  Status:  Discontinued        12 Million Units 41.7 mL/hr over 12 Hours Intravenous Every 12 hours 01/19/22 1151 01/21/22 1103   01/16/22 1400  vancomycin (VANCOCIN) IVPB 1000 mg/200 mL premix  Status:  Discontinued        1,000 mg 200 mL/hr over 60 Minutes Intravenous Every 24 hours 01/16/22 1219 01/19/22 1151   01/15/22 1200  vancomycin (VANCOREADY) IVPB 1500 mg/300 mL        1,500 mg 150 mL/hr over 120 Minutes Intravenous  Once 01/15/22 0157 01/15/22 1353   01/15/22 1000  ceFEPIme (MAXIPIME) 2 g in sodium chloride 0.9 % 100 mL IVPB  Status:  Discontinued        2 g 200 mL/hr over 30 Minutes Intravenous Every 12 hours 01/15/22 0138 01/19/22 1151   01/15/22 0157  vancomycin variable dose per unstable renal function (pharmacist dosing)  Status:  Discontinued         Does not apply See admin instructions 01/15/22 0157 01/16/22 1220   01/14/22 2200  ceFEPIme (MAXIPIME) 2 g in sodium chloride 0.9 % 100 mL IVPB        2 g 200 mL/hr over 30 Minutes Intravenous  Once 01/14/22 2149 01/14/22 2311   01/14/22 2200  linezolid (ZYVOX) IVPB 600 mg        600 mg 300 mL/hr over 60 Minutes Intravenous Once 01/14/22 2149 01/15/22 0249       Subjective: No new complaints  Objective: Vitals:   01/23/22 0501 01/23/22 0519 01/23/22 1030 01/23/22 1721  BP: 121/82  119/83 113/82  Pulse: 84  96   Resp: 14   19  Temp: 98.6 F (37 C)  99.2 F (37.3 C)   TempSrc: Oral  Oral   SpO2: 97%  98%   Weight:  84.9 kg    Height:         Intake/Output Summary (Last 24 hours) at 01/23/2022 1933 Last data filed at 01/23/2022 1900 Gross per 24 hour  Intake 540 ml  Output 1600 ml  Net -1060 ml   Filed Weights   01/19/22 0553 01/22/22 0428 01/23/22 0519  Weight: 94.8 kg 86.6 kg 84.9 kg    Examination:  General: No acute distress. Cardiovascular: RRR Lungs: unlabored Neurological: Alert and oriented 3. Moves all extremities 4 with equal strength. Cranial nerves II through XII grossly intact. Extremities: RLE in dressing  Data Reviewed: I have personally reviewed following labs and imaging studies  CBC: Recent Labs  Lab 01/19/22 0211 01/20/22 0159 01/21/22 0500 01/22/22 0900 01/22/22 1500 01/23/22 0535  WBC 6.3 7.0 5.9 3.5*  5.4 5.5  NEUTROABS 4.2 4.1 3.1 2.1  --  3.2  HGB 7.9* 7.8* 8.0* 12.8* 8.6* 8.1*  HCT 24.1* 23.1* 24.0* 37.1* 25.2* 25.0*  MCV 70.9* 70.2* 71.6* 69.7* 71.0* 72.5*  PLT 187 196 219 143* 228 [id number]    Basic Metabolic Panel: Recent Labs  Lab 01/20/22 0159 01/21/22 0500 01/22/22 0037 01/22/22 0900 01/22/22 1500 01/23/22 0535  NA 139 137  --  136 135 137  K 4.2 4.1  --  4.0 3.8 4.0  CL 113* 107  --  103 104 104  CO2 21* 24  --  23 26 24   GLUCOSE 106* 99  --  98 97 93  BUN 20 14  --  16 16 21   CREATININE 1.34* 1.32*  --  1.33* 1.42* 1.59*  CALCIUM 8.6* 9.0  --  8.9 8.8* 8.9  MG 1.7 1.5* 1.4* 2.1  --  2.1  PHOS 2.4* 2.8  --  3.4  --  3.6    GFR: Estimated Creatinine Clearance: 52.1 mL/min (Thelton Graca) (by C-G formula based on SCr of 1.59 mg/dL (H)).  Liver Function Tests: Recent Labs  Lab 01/19/22 0211 01/20/22 0159 01/21/22 0500 01/22/22 0900 01/23/22 0535  AST 18 15 17  13* 14*  ALT 18 17 16 14 13   ALKPHOS 61 57 60 68 67  BILITOT 0.6 0.6 0.5 0.6 0.6  PROT 6.4* 6.2* 6.5 6.8 6.7  ALBUMIN 2.1* 2.2* 2.2* 2.3* 2.3*    CBG: No results for input(s): "GLUCAP" in the last 168 hours.    Recent Results (from the past 240 hour(s))  Blood Culture (routine x 2)     Status: None    Collection Time: 01/14/22 10:11 PM   Specimen: BLOOD  Result Value Ref Range Status   Specimen Description BLOOD BLOOD RIGHT ARM  Final   Special Requests   Final    BOTTLES DRAWN AEROBIC AND ANAEROBIC Blood Culture results may not be optimal due to an inadequate volume of blood received in culture bottles   Culture   Final    NO GROWTH 5 DAYS Performed at [facility], [place] [address], [place], [place] [address]    Report Status 01/19/2022 FINAL  Final  Body fluid culture w Gram Stain     Status: None   Collection Time: 01/15/22  3:08 PM   Specimen: Body Fluid  Result Value Ref Range Status   Specimen Description FLUID  Final   Special Requests RIGHT KNEE ASPIRATION  Final   Gram Stain   Final    MODERATE WBC PRESENT, PREDOMINANTLY PMN NO ORGANISMS SEEN    Culture   Final    NO GROWTH 3 DAYS Performed at [facility], [address] [place], [place] [address]    Report Status 01/19/2022 FINAL  Final  Respiratory (~20 pathogens) panel by PCR     Status: None   Collection Time: 01/16/22 12:03 AM   Specimen: Nasopharyngeal Swab; Respiratory  Result Value Ref Range Status   Adenovirus NOT DETECTED NOT DETECTED Final   Coronavirus 229E NOT DETECTED NOT DETECTED Final    Comment: (NOTE) The Coronavirus on the Respiratory Panel, DOES NOT test for the novel  Coronavirus (2019 nCoV)    Coronavirus HKU1 NOT DETECTED NOT DETECTED Final   Coronavirus NL63 NOT DETECTED NOT DETECTED Final   Coronavirus OC43 NOT DETECTED NOT DETECTED Final   Metapneumovirus NOT DETECTED NOT DETECTED Final   Rhinovirus / Enterovirus NOT DETECTED NOT DETECTED Final  Influenza Aldine Grainger NOT DETECTED NOT DETECTED Final   Influenza B NOT DETECTED NOT DETECTED Final   Parainfluenza Virus 1 NOT DETECTED NOT DETECTED Final   Parainfluenza Virus 2 NOT DETECTED NOT DETECTED Final   Parainfluenza Virus 3 NOT DETECTED NOT DETECTED Final   Parainfluenza Virus 4 NOT DETECTED NOT DETECTED Final    Respiratory Syncytial Virus NOT DETECTED NOT DETECTED Final   Bordetella pertussis NOT DETECTED NOT DETECTED Final   Bordetella Parapertussis NOT DETECTED NOT DETECTED Final   Chlamydophila pneumoniae NOT DETECTED NOT DETECTED Final   Mycoplasma pneumoniae NOT DETECTED NOT DETECTED Final    Comment: Performed at [facility], [place] [address] [place], [place] [address]  Aerobic/Anaerobic Culture w Gram Stain (surgical/deep wound)     Status: None   Collection Time: 01/17/22  9:18 AM   Specimen: Synovial, Right Knee; Body Fluid  Result Value Ref Range Status   Specimen Description KNEE  Final   Special Requests  RIGHT, JOINT FLUID  Final   Gram Stain   Final    MODERATE WBC PRESENT,BOTH PMN AND MONONUCLEAR NO ORGANISMS SEEN    Culture   Final    RARE STAPHYLOCOCCUS AURICULARIS NO ANAEROBES ISOLATED Performed at [facility], [address] [place], [place] [address]    Report Status 01/22/2022 FINAL  Final   Organism ID, Bacteria STAPHYLOCOCCUS AURICULARIS  Final      Susceptibility   Staphylococcus auricularis - MIC*    CIPROFLOXACIN <=0.5 SENSITIVE Sensitive     ERYTHROMYCIN 0.5 SENSITIVE Sensitive     GENTAMICIN <=0.5 SENSITIVE Sensitive     OXACILLIN RESISTANT Resistant     TETRACYCLINE <=1 SENSITIVE Sensitive     VANCOMYCIN 1 SENSITIVE Sensitive     TRIMETH/SULFA <=10 SENSITIVE Sensitive     CLINDAMYCIN <=0.25 SENSITIVE Sensitive     RIFAMPIN <=0.5 SENSITIVE Sensitive     Inducible Clindamycin NEGATIVE Sensitive     * RARE STAPHYLOCOCCUS AURICULARIS  Aerobic/Anaerobic Culture w Gram Stain (surgical/deep wound)     Status: None   Collection Time: 01/17/22  9:21 AM   Specimen: Synovial, Right Knee; Tissue  Result Value Ref Range Status   Specimen Description TISSUE  Final   Special Requests  RIGHT KNEE  Final   Gram Stain   Final    FEW WBC PRESENT, PREDOMINANTLY PMN NO ORGANISMS SEEN    Culture   Final    RARE STAPHYLOCOCCUS COHNII NO ANAEROBES  ISOLATED Performed at [facility], [address] [place],  [address]    Report Status 01/22/2022 FINAL  Final   Organism ID, Bacteria STAPHYLOCOCCUS COHNII  Final      Susceptibility   Staphylococcus cohnii - MIC*    CIPROFLOXACIN <=0.5 SENSITIVE Sensitive     ERYTHROMYCIN >=8 RESISTANT Resistant     GENTAMICIN <=0.5 SENSITIVE Sensitive     OXACILLIN 2 RESISTANT Resistant     TETRACYCLINE >=16 RESISTANT Resistant     VANCOMYCIN 1 SENSITIVE Sensitive     TRIMETH/SULFA 80 RESISTANT Resistant     CLINDAMYCIN RESISTANT Resistant     RIFAMPIN <=0.5 SENSITIVE Sensitive     Inducible Clindamycin POSITIVE Resistant     * RARE STAPHYLOCOCCUS COHNII         Radiology Studies: No results found.      Scheduled Meds:  allopurinol  100 mg Oral Daily   apixaban  5 mg Oral BID   aspirin  81 mg Oral Daily   atorvastatin  20  mg Oral Daily   carvedilol  25 mg Oral BID WC   Chlorhexidine Gluconate Cloth  6 each Topical Daily   cloNIDine  0.2 mg Oral TID   dapagliflozin propanediol  10 mg Oral Daily   ferrous sulfate  325 mg Oral QODAY   furosemide  40 mg Oral Daily   hydrALAZINE  50 mg Oral Q8H   irbesartan  300 mg Oral Daily   sodium chloride flush  10-40 mL Intracatheter Q12H   Continuous Infusions:  sodium chloride Stopped (01/19/22 1511)   DAPTOmycin (CUBICIN) 650 mg in sodium chloride 0.9 % IVPB Stopped (01/22/22 2021)     LOS: 8 days    Time spent: over 30 min    Fayrene Helper, MD [facility]   To contact the attending provider between 7A-7P or the covering provider during after hours 7P-7A, please log into the web site [email or web address] and access using universal [place] password for that web site. If you do not have the password, please call the hospital operator.  01/23/2022, 7:33 PM

## 2022-01-23 NOTE — Progress Notes (Signed)
Rounding Note    Patient Name: [name] Date of Encounter: 01/23/2022  [place] Cardiologist: Donato Heinz, MD   Subjective   BP 119/83.  No chest pain or dyspnea  Inpatient Medications    Scheduled Meds:  allopurinol  100 mg Oral Daily   apixaban  5 mg Oral BID   aspirin  81 mg Oral Daily   atorvastatin  20 mg Oral Daily   carvedilol  25 mg Oral BID WC   Chlorhexidine Gluconate Cloth  6 each Topical Daily   cloNIDine  0.2 mg Oral TID   dapagliflozin propanediol  10 mg Oral Daily   ferrous sulfate  325 mg Oral QODAY   furosemide  40 mg Oral Daily   hydrALAZINE  50 mg Oral Q8H   irbesartan  300 mg Oral Daily   sodium chloride flush  10-40 mL Intracatheter Q12H   Continuous Infusions:  sodium chloride Stopped (01/19/22 1511)   DAPTOmycin (CUBICIN) 650 mg in sodium chloride 0.9 % IVPB Stopped (01/22/22 2021)   PRN Meds: sodium chloride, acetaminophen **OR** acetaminophen, albuterol, fentaNYL (SUBLIMAZE) injection, hydrOXYzine, labetalol, ondansetron **OR** ondansetron (ZOFRAN) IV, oxyCODONE, sodium chloride flush   Vital Signs    Vitals:   01/22/22 2245 01/23/22 0501 01/23/22 0519 01/23/22 1030  BP: 102/69 121/82  119/83  Pulse: 75 84  96  Resp: 14 14    Temp:  98.6 F (37 C)  99.2 F (37.3 C)  TempSrc:  Oral  Oral  SpO2: 99% 97%  98%  Weight:   84.9 kg   Height:        Intake/Output Summary (Last 24 hours) at 01/23/2022 1258 Last data filed at 01/23/2022 0900 Gross per 24 hour  Intake 298 ml  Output 1600 ml  Net -1302 ml       01/23/2022    5:19 AM 01/22/2022    4:28 AM 01/19/2022    5:53 AM  Last 3 Weights  Weight (lbs) 187 lb 3.2 oz 190 lb 14.4 oz 208 lb 15.9 oz  Weight (kg) 84.913 kg 86.592 kg 94.8 kg      Telemetry    Atrial fibrillation with HR 70-80s, NSVT up to 5 beats- Personally Reviewed  ECG    No new ECG - Personally Reviewed  Physical Exam   GEN: No acute distress.   Neck: No JVD Cardiac:  irregularly irregular, no murmurs, rubs, or gallops.  Respiratory: Clear to auscultation bilaterally. GI: Soft, nontender, non-distended  MS: No edema; No deformity. Neuro:  Nonfocal  Psych: Normal affect   Labs    High Sensitivity Troponin:   Recent Labs  Lab 01/13/22 0424 01/14/22 2128 01/15/22 0150  TROPONINIHS 16 29* 29*      Chemistry Recent Labs  Lab 01/21/22 0500 01/22/22 0037 01/22/22 0900 01/22/22 1500 01/23/22 0535  NA 137  --  136 135 137  K 4.1  --  4.0 3.8 4.0  CL 107  --  103 104 104  CO2 24  --  23 26 24   GLUCOSE 99  --  98 97 93  BUN 14  --  16 16 21   CREATININE 1.32*  --  1.33* 1.42* 1.59*  CALCIUM 9.0  --  8.9 8.8* 8.9  MG 1.5* 1.4* 2.1  --  2.1  PROT 6.5  --  6.8  --  6.7  ALBUMIN 2.2*  --  2.3*  --  2.3*  AST 17  --  13*  --  14*  ALT 16  --  14  --  13  ALKPHOS 60  --  68  --  67  BILITOT 0.5  --  0.6  --  0.6  GFRNONAA >60  --  >60 56* 49*  ANIONGAP 6  --  10 5 9      Lipids No results for input(s): "CHOL", "TRIG", "HDL", "LABVLDL", "LDLCALC", "CHOLHDL" in the last 168 hours.  Hematology Recent Labs  Lab 01/22/22 0900 01/22/22 1500 01/23/22 0535  WBC 3.5* 5.4 5.5  RBC 5.32 3.55* 3.45*  HGB 12.8* 8.6* 8.1*  HCT 37.1* 25.2* 25.0*  MCV 69.7* 71.0* 72.5*  MCH 24.1* 24.2* 23.5*  MCHC 34.5 34.1 32.4  RDW 17.9* 16.9* 17.1*  PLT 143* 228 229    Thyroid No results for input(s): "TSH", "FREET4" in the last 168 hours.  BNP Recent Labs  Lab 01/18/22 0147 01/19/22 0211 01/22/22 0900  BNP 669.1* 478.3* 407.4*     DDimer No results for input(s): "DDIMER" in the last 168 hours.   Radiology    No results found.  Cardiac Studies   Cath 05/27/2021   Ost RCA to Prox RCA lesion is 40% stenosed.   Prox RCA to Mid RCA lesion is 20% stenosed.   RPDA lesion is 30% stenosed.   RPAV lesion is 50% stenosed.   Dist Cx lesion is 50% stenosed.   2nd Diag lesion is 90% stenosed.   Prox LAD to Mid LAD lesion is 30% stenosed.   Dist LAD  lesion is 90% stenosed.   The LAD is a large caliber vessel that courses to the apex. There is mild non-obstructive disease in the proximal and mid LAD. The apical LAD has diffuse severe stenosis. The first Diagonal is a moderate caliber vessel with mild non-obstructive disease. The second Diagonal branch is a moderate caliber vessel with a focal severe stenosis The Circumflex is a large caliber vessel with moderate distal stenosis The RCA is a large dominant artery with mild eccentric proximal stenosis. The PDA and posterolateral arteries have moderate diffuse disease.  Severe aortic stenosis by echo. The aortic valve was not crossed today.    RA 14 RV 42/12/19 PA 44/22 (mean 27) PCWP 19 AO 103/64   Will continue workup for AVR. Given his severe LVH and young age, he may be better treated with surgical AVR than TAVR. Will review with our valve team and make arrangements for CT scans if felt necessary before being seen by our CT surgeon.    Echo 12/28/2021 1. Left ventricular ejection fraction, by estimation, is 60 to 65%. The  left ventricle has normal function. The left ventricle has no regional  wall motion abnormalities. There is severe concentric left ventricular  hypertrophy. Left ventricular diastolic   function could not be evaluated. Elevated left atrial pressure.   2. Right ventricular systolic function is normal. The right ventricular  size is normal. There is mildly elevated pulmonary artery systolic  pressure.   3. Left atrial size was severely dilated.   4. Right atrial size was mildly dilated.   5. The mitral valve is normal in structure. Mild mitral valve  regurgitation. No evidence of mitral stenosis.   6. The aortic valve is tricuspid. Aortic valve regurgitation is mild.  Moderate to severe aortic valve stenosis.   7. Aortic dilatation noted. There is mild dilatation of the ascending  aorta, measuring 40 mm.   8. The inferior vena cava is normal in size with greater  than 50%  respiratory  variability, suggesting right atrial pressure of 3 mmHg.   Patient Profile     61 y.o. male with PMH of chronic diastolic CHF, resistant HTN, moderate to severe AS, PAF, HLD and PAD who had recent S pyogenes bacteremia and R leg cellulitis and is now admitted with sepsis secondary to R septic arthritis of knee. Cardiology consulted for afib with RVR since 01/16/2022.   Assessment & Plan    Persistent afib with RVR  - in the setting of sepsis  - IV cardizem weaned off, now on coreg 25mg  BID for rate control. Appears well rate controlled  - planned for Maze during AVR  - Eliquis was held for bleeding at wound site, has been restarted  Severe aortic stenosis  - Echo 12/28/2021 EF 60-65%, moderate to severe AS, dilated ascending aorta at 40 mm, aortic valve mean gradient 29 mmHg - patient was previously scheduled for AVR in 07/2021, surgery delayed due to leg wound. Seen [name] on 12/23/2021 with plan to repeat echo (done on 12/28/2021), then schedule AVR with bioprosthetic valve and CABG to diagonal, biatrial Maze   Chronic diastolic CHF: restarted on farxiga. On 40mg  TID of lasix at home (although home med says 60mg  TID, however patient has only been taking 40mg  TID dosing), held given AKI. Restarted lasix at 40 mg daily  Resistant HTN: clonidine, hydralazine, coreg and olmesartan at home. Have restarted home meds, BP appears controlled.  Septic arthritis of R knee: underwent I&D on 10/16. ID consulted on 10/18, directing abx usage.   Acute on CKD: Cr on arrival 2.4, now down to 1.59  CAD: see cath report 05/27/2021      For questions or updates, please contact [place] Please consult [email or web address] for contact info under        Signed, Donato Heinz, MD  01/23/2022, 12:58 PM

## 2022-01-24 DIAGNOSIS — I4819 Other persistent atrial fibrillation: Secondary | ICD-10-CM | POA: Diagnosis not present

## 2022-01-24 DIAGNOSIS — R6521 Severe sepsis with septic shock: Secondary | ICD-10-CM | POA: Diagnosis not present

## 2022-01-24 DIAGNOSIS — I350 Nonrheumatic aortic (valve) stenosis: Secondary | ICD-10-CM | POA: Diagnosis not present

## 2022-01-24 DIAGNOSIS — A419 Sepsis, unspecified organism: Secondary | ICD-10-CM | POA: Diagnosis not present

## 2022-01-24 LAB — CBC WITH DIFFERENTIAL/PLATELET
Abs Immature Granulocytes: 0.04 10*3/uL (ref 0.00–0.07)
Basophils Absolute: 0 10*3/uL (ref 0.0–0.1)
Basophils Relative: 0 %
Eosinophils Absolute: 0.1 10*3/uL (ref 0.0–0.5)
Eosinophils Relative: 3 %
HCT: 26.2 % — ABNORMAL LOW (ref 39.0–52.0)
Hemoglobin: 8.8 g/dL — ABNORMAL LOW (ref 13.0–17.0)
Immature Granulocytes: 1 %
Lymphocytes Relative: 26 %
Lymphs Abs: 1.5 10*3/uL (ref 0.7–4.0)
MCH: 24.2 pg — ABNORMAL LOW (ref 26.0–34.0)
MCHC: 33.6 g/dL (ref 30.0–36.0)
MCV: 72.2 fL — ABNORMAL LOW (ref 80.0–100.0)
Monocytes Absolute: 0.6 10*3/uL (ref 0.1–1.0)
Monocytes Relative: 10 %
Neutro Abs: 3.4 10*3/uL (ref 1.7–7.7)
Neutrophils Relative %: 60 %
Platelets: 240 10*3/uL (ref 150–400)
RBC: 3.63 MIL/uL — ABNORMAL LOW (ref 4.22–5.81)
RDW: 17.2 % — ABNORMAL HIGH (ref 11.5–15.5)
WBC: 5.7 10*3/uL (ref 4.0–10.5)
nRBC: 0.4 % — ABNORMAL HIGH (ref 0.0–0.2)

## 2022-01-24 LAB — COMPREHENSIVE METABOLIC PANEL
ALT: 14 U/L (ref 0–44)
AST: 14 U/L — ABNORMAL LOW (ref 15–41)
Albumin: 2.4 g/dL — ABNORMAL LOW (ref 3.5–5.0)
Alkaline Phosphatase: 71 U/L (ref 38–126)
Anion gap: 8 (ref 5–15)
BUN: 22 mg/dL (ref 8–23)
CO2: 25 mmol/L (ref 22–32)
Calcium: 9.1 mg/dL (ref 8.9–10.3)
Chloride: 104 mmol/L (ref 98–111)
Creatinine, Ser: 1.47 mg/dL — ABNORMAL HIGH (ref 0.61–1.24)
GFR, Estimated: 54 mL/min — ABNORMAL LOW (ref >60)
Glucose, Bld: 103 mg/dL — ABNORMAL HIGH (ref 70–99)
Potassium: 3.9 mmol/L (ref 3.5–5.1)
Sodium: 137 mmol/L (ref 135–145)
Total Bilirubin: 1.1 mg/dL (ref 0.3–1.2)
Total Protein: 7.3 g/dL (ref 6.5–8.1)

## 2022-01-24 LAB — PHOSPHORUS: Phosphorus: 3.4 mg/dL (ref 2.5–4.6)

## 2022-01-24 LAB — MAGNESIUM: Magnesium: 1.9 mg/dL (ref 1.7–2.4)

## 2022-01-24 MED ORDER — CLONIDINE HCL 0.1 MG PO TABS
0.1000 mg | ORAL_TABLET | Freq: Three times a day (TID) | ORAL | Status: DC
  Administered 2022-01-24 – 2022-01-25 (×3): 0.1 mg via ORAL
  Filled 2022-01-24 (×3): qty 1

## 2022-01-24 MED ORDER — CARVEDILOL 12.5 MG PO TABS
12.5000 mg | ORAL_TABLET | Freq: Two times a day (BID) | ORAL | Status: DC
  Administered 2022-01-25: 12.5 mg via ORAL
  Filled 2022-01-24: qty 1

## 2022-01-24 MED ORDER — HYDRALAZINE HCL 25 MG PO TABS
25.0000 mg | ORAL_TABLET | Freq: Three times a day (TID) | ORAL | Status: DC
  Administered 2022-01-24 – 2022-01-25 (×3): 25 mg via ORAL
  Filled 2022-01-24 (×3): qty 1

## 2022-01-24 NOTE — Progress Notes (Signed)
Rounding Note    Patient Name: [name] Date of Encounter: 01/24/2022  [place] Cardiologist: Donato Heinz, MD   Subjective   Patient sitting in bedside chair this morning. His only complaint today is nausea which he says gets worse with ambulation. Denies chest pain, shortness of breath, palpitations, lightheadedness/dizziness.  Inpatient Medications    Scheduled Meds:  allopurinol  100 mg Oral Daily   apixaban  5 mg Oral BID   aspirin  81 mg Oral Daily   atorvastatin  20 mg Oral Daily   carvedilol  25 mg Oral BID WC   Chlorhexidine Gluconate Cloth  6 each Topical Daily   cloNIDine  0.2 mg Oral TID   dapagliflozin propanediol  10 mg Oral Daily   ferrous sulfate  325 mg Oral QODAY   furosemide  40 mg Oral Daily   hydrALAZINE  50 mg Oral Q8H   irbesartan  300 mg Oral Daily   sodium chloride flush  10-40 mL Intracatheter Q12H   Continuous Infusions:  sodium chloride Stopped (01/19/22 1511)   DAPTOmycin (CUBICIN) 650 mg in sodium chloride 0.9 % IVPB 650 mg (01/23/22 2031)   PRN Meds: sodium chloride, acetaminophen **OR** acetaminophen, albuterol, fentaNYL (SUBLIMAZE) injection, hydrOXYzine, labetalol, ondansetron **OR** ondansetron (ZOFRAN) IV, oxyCODONE, sodium chloride flush   Vital Signs    Vitals:   01/23/22 1030 01/23/22 1721 01/23/22 2020 01/24/22 0559  BP: 119/83 113/82 128/80 (!) 136/95  Pulse: 96  88 88  Resp:  19 (!) 21 15  Temp: 99.2 F (37.3 C)  99.1 F (37.3 C) 98.7 F (37.1 C)  TempSrc: Oral  Oral Oral  SpO2: 98%  97% 97%  Weight:      Height:        Intake/Output Summary (Last 24 hours) at 01/24/2022 0750 Last data filed at 01/24/2022 0600 Gross per 24 hour  Intake 477 ml  Output 1050 ml  Net -573 ml      01/23/2022    5:19 AM 01/22/2022    4:28 AM 01/19/2022    5:53 AM  Last 3 Weights  Weight (lbs) 187 lb 3.2 oz 190 lb 14.4 oz 208 lb 15.9 oz  Weight (kg) 84.913 kg 86.592 kg 94.8 kg      Telemetry     Atrial fibrillation with ventricular rates 80s-110s - Personally Reviewed  ECG    No new tracing  Physical Exam   GEN: No acute distress.   Neck: No JVD Cardiac: Irregularly irregular, systolic murmur 3/6, no rubs, or gallops.  Respiratory: Clear to auscultation bilaterally. GI: Soft, nontender, non-distended  MS: No edema; No deformity. Dressings on right knee and right ankle. Neuro:  Nonfocal  Psych: Normal affect   Labs    High Sensitivity Troponin:   Recent Labs  Lab 01/13/22 0424 01/14/22 2128 01/15/22 0150  TROPONINIHS 16 29* 29*     Chemistry Recent Labs  Lab 01/22/22 0900 01/22/22 1500 01/23/22 0535 01/24/22 0608  NA 136 135 137 137  K 4.0 3.8 4.0 3.9  CL 103 104 104 104  CO2 23 26 24 25   GLUCOSE 98 97 93 103*  BUN 16 16 21 22   CREATININE 1.33* 1.42* 1.59* 1.47*  CALCIUM 8.9 8.8* 8.9 9.1  MG 2.1  --  2.1 1.9  PROT 6.8  --  6.7 7.3  ALBUMIN 2.3*  --  2.3* 2.4*  AST 13*  --  14* 14*  ALT 14  --  13 14  ALKPHOS 68  --  67 71  BILITOT 0.6  --  0.6 1.1  GFRNONAA >60 56* 49* 54*  ANIONGAP 10 5 9 8     Lipids No results for input(s): "CHOL", "TRIG", "HDL", "LABVLDL", "LDLCALC", "CHOLHDL" in the last 168 hours.  Hematology Recent Labs  Lab 01/22/22 1500 01/23/22 0535 01/24/22 0608  WBC 5.4 5.5 5.7  RBC 3.55* 3.45* 3.63*  HGB 8.6* 8.1* 8.8*  HCT 25.2* 25.0* 26.2*  MCV 71.0* 72.5* 72.2*  MCH 24.2* 23.5* 24.2*  MCHC 34.1 32.4 33.6  RDW 16.9* 17.1* 17.2*  PLT 228 229 240   Thyroid No results for input(s): "TSH", "FREET4" in the last 168 hours.  BNP Recent Labs  Lab 01/18/22 0147 01/19/22 0211 01/22/22 0900  BNP 669.1* 478.3* 407.4*    DDimer No results for input(s): "DDIMER" in the last 168 hours.   Radiology    No results found.  Cardiac Studies   Cath 05/27/2021   Ost RCA to Prox RCA lesion is 40% stenosed.   Prox RCA to Mid RCA lesion is 20% stenosed.   RPDA lesion is 30% stenosed.   RPAV lesion is 50% stenosed.   Dist Cx  lesion is 50% stenosed.   2nd Diag lesion is 90% stenosed.   Prox LAD to Mid LAD lesion is 30% stenosed.   Dist LAD lesion is 90% stenosed.   The LAD is a large caliber vessel that courses to the apex. There is mild non-obstructive disease in the proximal and mid LAD. The apical LAD has diffuse severe stenosis. The first Diagonal is a moderate caliber vessel with mild non-obstructive disease. The second Diagonal branch is a moderate caliber vessel with a focal severe stenosis The Circumflex is a large caliber vessel with moderate distal stenosis The RCA is a large dominant artery with mild eccentric proximal stenosis. The PDA and posterolateral arteries have moderate diffuse disease.  Severe aortic stenosis by echo. The aortic valve was not crossed today.    RA 14 RV 42/12/19 PA 44/22 (mean 27) PCWP 19 AO 103/64   Will continue workup for AVR. Given his severe LVH and young age, he may be better treated with surgical AVR than TAVR. Will review with our valve team and make arrangements for CT scans if felt necessary before being seen by our CT surgeon.      Echo 12/28/2021 1. Left ventricular ejection fraction, by estimation, is 60 to 65%. The  left ventricle has normal function. The left ventricle has no regional  wall motion abnormalities. There is severe concentric left ventricular  hypertrophy. Left ventricular diastolic   function could not be evaluated. Elevated left atrial pressure.   2. Right ventricular systolic function is normal. The right ventricular  size is normal. There is mildly elevated pulmonary artery systolic  pressure.   3. Left atrial size was severely dilated.   4. Right atrial size was mildly dilated.   5. The mitral valve is normal in structure. Mild mitral valve  regurgitation. No evidence of mitral stenosis.   6. The aortic valve is tricuspid. Aortic valve regurgitation is mild.  Moderate to severe aortic valve stenosis.   7. Aortic dilatation noted. There  is mild dilatation of the ascending  aorta, measuring 40 mm.   8. The inferior vena cava is normal in size with greater than 50%  respiratory variability, suggesting right atrial pressure of 3 mmHg  Patient Profile     61 y.o. male with PMH of chronic diastolic CHF, resistant HTN, moderate to severe  AS, PAF, HLD and PAD who had recent S pyogenes bacteremia and R leg cellulitis and is now admitted with sepsis secondary to R septic arthritis of knee. Cardiology consulted for afib with RVR since 01/16/2022.   Assessment & Plan    Persistent Atrial Fibrillation with RVR  Patient with afib and RVR prompting cardiology consult on 10/17. Patient initially managed with IV Cardizem, now receiving Carvedilol 25mg  BID. Eliquis resumed on 10/20 following I/D with bleeding on 10/16.  Rates remain well controlled. Continue Carvedilol 25mg  BID. Continue Eliquis Maze procedure planned during future AVR surgery  Severe Aortic Stenosis  Patient with symptomatic AS, TTE on 12/28/21 shows EF 60-65%, moderate to severe AS with dilated ascending aorta and AV mean gradient 29 mmHg, max gradient 68mmHg. He was originally planned for AVR this past April, delayed due to a leg wound. Following evaluation by [name] 12/23/21, plan is to schedule AVR, CABG to diag, biatrial MAZE. Follow up appointment with [name] scheduled for 01/26/22.  Chronic Diastolic Congestive Heart Failure  Left ventricle severely hypertrophic, left atria severely dilated on 12/28/21 TTE. BNP 407.4 on 01/22/22. Home GDMT included Farxiga, Lasix (in chart as 60mg  TID, patient only taking 40mg  TID). Lasix held earlier in the admission, resumed at 40mg  QD.  Continue home GDMT. Could consider replacing some of his home antihypertensive regiment with MRA.   Resistant Hypertension  Patient taking Clonidine, Hydralazine, Coreg, Olmesartan. BP continues to appear well controlled this admission on this regimen.  Acute on Chronic CKD  Patient  with Creatinine up to 2.4 earlier this admission, now improved to 1.47. Continue Lasix at lower dose, 40mg  daily.  Septic arthritis, right knee  Patient s/p I&D with ortho on 10/16. Infectious disease is now following and managing antibiotic therapy. WBC 5.7, down from peak of 14.2.     For questions or updates, please contact [place] Please consult [email or web address] for contact info under        Signed, Lily Kocher, PA-C  01/24/2022, 7:50 AM

## 2022-01-24 NOTE — Progress Notes (Signed)
Occupational Therapy Treatment Patient Details Name: [name] MRN: [id number] DOB: 02-Mar-1961 Today's Date: 01/24/2022   History of present illness Pt is a 61 y.o. male admitted 01/14/22 with progressive pain in Rt knee and weakness. S/p Rt knee aspiration 10/14 and I&D 10/16 with gout flare. PMhx: hospitalized 9/30-10/08/2021 with sepsis due to group A strep with ARF. HTN, CHF, HLD, gout, CKD, Afib, PAD, aortic stenosis, cellulitis, chronic back pain.   OT comments  Pt progressing towards goals, still reporting 9/10 pain in R knee, pt needing min guard-min A for UB/LB bathing and UB dressing seated at sink, pt min guard for transfer with RW and supervision for bed mobility when given increased time. Pt emotional and frustrated with slow progress, also stating his wife is now in the hospital and is unsure of how much help he'll have at d/c. Pt presenting with impairments listed below, will follow acutely. Continue to recommend HHOT at d/c if family able to provide level of assist needed, if not may need SNF at d/c.   Recommendations for follow up therapy are one component of a multi-disciplinary discharge planning process, led by the attending physician.  Recommendations may be updated based on patient status, additional functional criteria and insurance authorization.    Follow Up Recommendations  Home health OT    Assistance Recommended at Discharge Frequent or constant Supervision/Assistance  Patient can return home with the following  Assistance with cooking/housework;Assist for transportation;Help with stairs or ramp for entrance;A little help with walking and/or transfers;A little help with bathing/dressing/bathroom   Equipment Recommendations  BSC/3in1    Recommendations for Other Services PT consult    Precautions / Restrictions Precautions Precautions: Fall;Other (comment) Precaution Comments: watch HR Restrictions Weight Bearing Restrictions: No RLE Weight Bearing: Weight  bearing as tolerated Other Position/Activity Restrictions: WBAT and ROM as tolerated to R leg       Mobility Bed Mobility Overal bed mobility: Needs Assistance Bed Mobility: Sit to Supine     Supine to sit: Supervision     General bed mobility comments: incr time    Transfers Overall transfer level: Needs assistance Equipment used: Rolling walker (2 wheels) Transfers: Sit to/from Stand Sit to Stand: Min guard           General transfer comment: Incr time to rise     Balance Overall balance assessment: Needs assistance Sitting-balance support: No upper extremity supported, Feet supported Sitting balance-Leahy Scale: Normal Sitting balance - Comments: supervision sitting EOB   Standing balance support: Bilateral upper extremity supported Standing balance-Leahy Scale: Fair Standing balance comment: Uses walker for pain relief not balance                           ADL either performed or assessed with clinical judgement   ADL Overall ADL's : Needs assistance/impaired         Upper Body Bathing: Minimal assistance;Sitting Upper Body Bathing Details (indicate cue type and reason): to wash back Lower Body Bathing: Sitting/lateral leans;Minimal assistance   Upper Body Dressing : Min guard;Sitting       Toilet Transfer: Min guard;Rolling walker (2 wheels)           Functional mobility during ADLs: Min guard;Rolling walker (2 wheels)      Extremity/Trunk Assessment Upper Extremity Assessment Upper Extremity Assessment: Overall WFL for tasks assessed   Lower Extremity Assessment Lower Extremity Assessment: Defer to PT evaluation        Vision  Vision Assessment?: No apparent visual deficits   Perception Perception Perception: Not tested   Praxis Praxis Praxis: Not tested    Cognition Arousal/Alertness: Awake/alert Behavior During Therapy: WFL for tasks assessed/performed Overall Cognitive Status: Within Functional Limits for tasks  assessed                                 General Comments: slow processing, seeminly losing track of time/confirming reason for admission        Exercises      Shoulder Instructions       General Comments HR 140 with UB/LB bathing at sink    Pertinent Vitals/ Pain       Pain Assessment Pain Assessment: Faces Pain Score: 8  Faces Pain Scale: Hurts whole lot Pain Location: R knee Pain Descriptors / Indicators: Constant, Grimacing Pain Intervention(s): Monitored during session, Limited activity within patient's tolerance, Repositioned, RN gave pain meds during session  Home Living                                          Prior Functioning/Environment              Frequency  Min 2X/week        Progress Toward Goals  OT Goals(current goals can now be found in the care plan section)  Progress towards OT goals: Progressing toward goals  Acute Rehab OT Goals Patient Stated Goal: to go home OT Goal Formulation: With patient Time For Goal Achievement: 01/31/22 Potential to Achieve Goals: Good ADL Goals Pt Will Perform Upper Body Dressing: with supervision;sitting Pt Will Perform Lower Body Dressing: with min assist;sit to/from stand;sitting/lateral leans Pt Will Transfer to Toilet: with min assist;ambulating;bedside commode Additional ADL Goal #1: pt will complete bed mobility mod I in prep for ADLs  Plan Discharge plan remains appropriate    Co-evaluation                 AM-PAC OT "6 Clicks" Daily Activity     Outcome Measure   Help from another person eating meals?: None Help from another person taking care of personal grooming?: A Little Help from another person toileting, which includes using toliet, bedpan, or urinal?: A Lot Help from another person bathing (including washing, rinsing, drying)?: A Lot Help from another person to put on and taking off regular upper body clothing?: A Little Help from another person  to put on and taking off regular lower body clothing?: A Lot 6 Click Score: 16    End of Session Equipment Utilized During Treatment: Rolling walker (2 wheels)  OT Visit Diagnosis: Pain Pain - Right/Left: Right Pain - part of body: Knee   Activity Tolerance Patient tolerated treatment well   Patient Left in chair;with call bell/phone within reach;with chair alarm set   Nurse Communication Mobility status        Time: 6644-0347 OT Time Calculation (min): 43 min  Charges: OT General Charges $OT Visit: 1 Visit OT Treatments $Self Care/Home Management : 38-52 mins  Lynnda Child, OTD, OTR/L Acute Rehab [phone] - [place] 01/24/2022, 10:52 AM

## 2022-01-24 NOTE — Progress Notes (Signed)
Physical Therapy Treatment Patient Details Name: [name] MRN: [id number] DOB: [birth year] Today's Date: 01/24/2022   History of Present Illness Pt is a 61 y.o. male admitted 01/14/22 with progressive pain in Rt knee and weakness. S/p Rt knee aspiration 10/14 and I&D 10/16 with gout flare. PMhx: hospitalized 9/30-10/08/2021 with sepsis due to group A strep with ARF. HTN, CHF, HLD, gout, CKD, Afib, PAD, aortic stenosis, cellulitis, chronic back pain.    PT Comments    Pt reports nausea today limiting activity. Pt in recliner on arrival and with initial stand reported lightheadness without drop in BP 94/71 (80) and HR 120. Pt slow to initiate movement with increased time between transitional movements and after moving RLE. Pt able to walk in hall today with reliance on RW, standing rest breaks and HR 140 with gait. Pt with AAROM for Rt knee and hip with pt with limited tolerance. Pt educated for need to be able to rely on bil LE strength and function if cardiac sx determined to be needed after discussion with cardiology during session. Encouraged OOB daily, walking to toilet and continued ROM and elevation. Pt required max assist for pericare after BM.    Recommendations for follow up therapy are one component of a multi-disciplinary discharge planning process, led by the attending physician.  Recommendations may be updated based on patient status, additional functional criteria and insurance authorization.  Follow Up Recommendations  Home health PT     Assistance Recommended at Discharge Intermittent Supervision/Assistance  Patient can return home with the following A little help with bathing/dressing/bathroom;Assistance with cooking/housework;Assist for transportation;Help with stairs or ramp for entrance;A little help with walking and/or transfers   Equipment Recommendations  None recommended by PT    Recommendations for Other Services       Precautions / Restrictions  Precautions Precautions: Fall;Other (comment) Precaution Comments: watch HR Restrictions Weight Bearing Restrictions: No RLE Weight Bearing: Weight bearing as tolerated Other Position/Activity Restrictions: WBAT and ROM as tolerated to R leg     Mobility  Bed Mobility               General bed mobility comments: in chair on arrival and end of session    Transfers Overall transfer level: Needs assistance     Sit to Stand: Min guard           General transfer comment: increased time to rise with little to no weight through RLE to stand, cues for foot placement    Ambulation/Gait Ambulation/Gait assistance: Min guard Gait Distance (Feet): 130 Feet Assistive device: Rolling walker (2 wheels) Gait Pattern/deviations: Step-to pattern, Decreased stance time - right, Decreased step length - right   Gait velocity interpretation: <1.31 ft/sec, indicative of household ambulator   General Gait Details: HR up to 140 with gait. 4 standing rests during gait, limited tolerance for stance on RLE. Cues for posture, proximity to RW and safety   Stairs             Wheelchair Mobility    Modified Rankin (Stroke Patients Only)       Balance Overall balance assessment: Needs assistance Sitting-balance support: No upper extremity supported, Feet supported Sitting balance-Leahy Scale: Good Sitting balance - Comments: chair without support of bil UE   Standing balance support: Bilateral upper extremity supported, Reliant on assistive device for balance Standing balance-Leahy Scale: Poor Standing balance comment: reliant on bil UE support due to pain in RLE  Cognition Arousal/Alertness: Awake/alert Behavior During Therapy: Flat affect Overall Cognitive Status: Within Functional Limits for tasks assessed                                          Exercises General Exercises - Lower Extremity Long Arc Quad: AAROM,  Right, Seated, 5 reps Hip Flexion/Marching: AAROM, Right, Seated, 10 reps    General Comments General comments (skin integrity, edema, etc.): HR 140 with UB/LB bathing at sink      Pertinent Vitals/Pain Pain Assessment Pain Score: 6  Pain Location: Rt knee with ROM Pain Descriptors / Indicators: Aching, Guarding Pain Intervention(s): Limited activity within patient's tolerance, Monitored during session, Repositioned    Home Living                          Prior Function            PT Goals (current goals can now be found in the care plan section) Progress towards PT goals: Progressing toward goals    Frequency    Min 3X/week      PT Plan Current plan remains appropriate    Co-evaluation              AM-PAC PT "6 Clicks" Mobility   Outcome Measure  Help needed turning from your back to your side while in a flat bed without using bedrails?: A Little Help needed moving from lying on your back to sitting on the side of a flat bed without using bedrails?: A Little Help needed moving to and from a bed to a chair (including a wheelchair)?: A Little Help needed standing up from a chair using your arms (e.g., wheelchair or bedside chair)?: A Little Help needed to walk in hospital room?: A Little Help needed climbing 3-5 steps with a railing? : A Lot 6 Click Score: 17    End of Session   Activity Tolerance: Patient tolerated treatment well Patient left: in chair;with call bell/phone within reach;with chair alarm set Nurse Communication: Mobility status;Other (comment) PT Visit Diagnosis: Pain;Other abnormalities of gait and mobility (R26.89);Difficulty in walking, not elsewhere classified (R26.2) Pain - Right/Left: Right Pain - part of body: Knee     Time: 1137-1229 PT Time Calculation (min) (ACUTE ONLY): 52 min  Charges:  $Gait Training: 8-22 mins $Therapeutic Activity: 23-37 mins                     Bayard Males, PT Acute Rehabilitation  Services Office: [place] 01/24/2022, 1:43 PM

## 2022-01-24 NOTE — Progress Notes (Addendum)
PROGRESS NOTE    [name]  [id number] DOB: 11-Jul-1960 DOA: 01/14/2022 PCP: [name], MD  Chief Complaint  Patient presents with   Hypotension    Brief Narrative:  [name] is [name] 61 y.o. male with [name] history of severe AS, HFpEF, AFib on eliquis, stage IIIa CKD, HTN, HLD, OSA not on CPAP who presented for the 2nd time in 2 days on 10/13 with increasing right knee pain. He had been admitted 9/30 with group [name] streptococcal bacteremia, ARF and RLE cellulitis as well as suspected gout of the right knee treated with steroid injection. With negative echo, penicillin and linezolid were given with improvement, transitioned to amoxicillin for 14 days per ID and discharged 10/5. He returned ultimately 10/13 with worsening pain and lethargy. He was septic with hypotension responsive to resuscitation, leukocytosis (WBC 14), AKI (SCr 2.4 from 1.5). IV antibiotics given, cultures drawn, and ultimately MR right knee revealed mod-large complex effusion, synovial thickening, lateral femoral condyle focal erosion, minimal adjacent marrow edema, extensive muscular edema, quadriceps tendinosis with partial-thickness tear. 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   Assessment & Plan:   Principal Problem:   Severe sepsis with septic shock (HCC)   Assessment and Plan: Nausea  Dyspnea on Exertion  Orthostatic Hypotension  - orthostasis when working with therapy, HR up to 140 with activity, though BP apparently low but without fall - will hold lasix, drop dose of clonidine, hydralazine, and coreg - follow orthostatics daily  - CXR without acute pulm process - ? If symptoms related to aortic stenosis - appreciate cardiology assistance - overall seems improved  Severe sepsis with AKI  Recent Group [name] Strep Bacteremia and Right Leg Cellulitis  Presumed Septic Arthritis of R Knee: Don't believe septic shock is correct since hypotension was  responsive to IV fluids.  - With recent history of S. pyogenes bacteremia, plan was oral amoxicillin 1g q8h through 10/15. Will request ID input regarding this.  - appreciate ID recs, recommending treatment for presumed septic arthritis of R knee -> PICC, IV daptomycin  - now with growth from surgical specimens as noted below, will discuss again with ID - Monitor blood cultures, NGTD. - Pt to continue coverage inclusive of group Flannery Cavallero strep per recent bacteremia.    CoNS Septic Arthritis of Right Knee  Tophaceous Gout Flare - s/p arthrotomy R knee with closure over drain and application of biologic graft using kerecis to the right leg per orthopedics 10/16. 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  - rare staph cohnii from synovial tissue, staph auricularis -> abx changed to daptomycin x4 weeks - will start allopurinol with crystals seen on synovial fluid  - Adjustments to pain control, change to oxycodone as he tolerated this better in the past. Also can give IV fentanyl (least histaminergic) prn.    AKI on stage IIIa CKD:  - Avoid nephrotoxins.    Persistent AFib with RVR: Rate stabilized - Continue coreg, cardizem - eliquis resumed this morning   HTN, chronic HFpEF, aortic stenosis: - Coreg 25mg  po BID (dose halved), irbesartan 30mg  daily, diltiazem 30mg  q6h, clonidine 0.2 mg TID (dose halved), hydralazine 50 mg q8 (dose halved)- appreciate cardiology assistance  - lasix restarted per cards - BNP down from prior, required IVF resuscitation, will avoid hypotension and monitor I/O, volume status closely  - Follow up with [name] after all infection has been treated to pursue AVR. Also discussing MAZE.  CAD: No angina. Plan per cardiology.    HLD:  - Resume statin   OSA:  - Nocturnal O2, pt not on CPAP    DVT prophylaxis: eliquis Code Status: full Family Communication: family at bedside Disposition:   Status is: Inpatient Remains inpatient appropriate  because: need for continued inpatient care   Consultants:  ID Cardiology orthpedics  Procedures:    Antimicrobials:  Anti-infectives (From admission, onward)    Start     Dose/Rate Route Frequency Ordered Stop   01/21/22 1200  DAPTOmycin (CUBICIN) 650 mg in sodium chloride 0.9 % IVPB        8 mg/kg  80.3 kg (Adjusted) 126 mL/hr over 30 Minutes Intravenous Daily 01/21/22 1103     01/20/22 0000  penicillin G IVPB  Status:  Discontinued        24 Million Units Intravenous Every 24 hours 01/20/22 1355 01/21/22    01/19/22 2200  penicillin G potassium 12 Million Units in dextrose 5 % 500 mL continuous infusion  Status:  Discontinued        12 Million Units 41.7 mL/hr over 12 Hours Intravenous Every 12 hours 01/19/22 1151 01/21/22 1103   01/16/22 1400  vancomycin (VANCOCIN) IVPB 1000 mg/200 mL premix  Status:  Discontinued        1,000 mg 200 mL/hr over 60 Minutes Intravenous Every 24 hours 01/16/22 1219 01/19/22 1151   01/15/22 1200  vancomycin (VANCOREADY) IVPB 1500 mg/300 mL        1,500 mg 150 mL/hr over 120 Minutes Intravenous  Once 01/15/22 0157 01/15/22 1353   01/15/22 1000  ceFEPIme (MAXIPIME) 2 g in sodium chloride 0.9 % 100 mL IVPB  Status:  Discontinued        2 g 200 mL/hr over 30 Minutes Intravenous Every 12 hours 01/15/22 0138 01/19/22 1151   01/15/22 0157  vancomycin variable dose per unstable renal function (pharmacist dosing)  Status:  Discontinued         Does not apply See admin instructions 01/15/22 0157 01/16/22 1220   01/14/22 2200  ceFEPIme (MAXIPIME) 2 g in sodium chloride 0.9 % 100 mL IVPB        2 g 200 mL/hr over 30 Minutes Intravenous  Once 01/14/22 2149 01/14/22 2311   01/14/22 2200  linezolid (ZYVOX) IVPB 600 mg        600 mg 300 mL/hr over 60 Minutes Intravenous Once 01/14/22 2149 01/15/22 0249       Subjective: Still nausea/LH with standing  Objective: Vitals:   01/24/22 1730 01/24/22 1755 01/24/22 1800 01/24/22 1825  BP: 93/69 106/80   101/72  Pulse: 78 80 82 87  Resp: 20 20  20   Temp:      TempSrc:      SpO2: 100% 98%  93%  Weight:      Height:        Intake/Output Summary (Last 24 hours) at 01/24/2022 1854 Last data filed at 01/24/2022 0600 Gross per 24 hour  Intake 360 ml  Output 1050 ml  Net -690 ml   Filed Weights   01/19/22 0553 01/22/22 0428 01/23/22 0519  Weight: 94.8 kg 86.6 kg 84.9 kg    Examination:  General: No acute distress. Cardiovascular: RRR Lungs: unlabored Abdomen: Soft, nontender, nondistended  Neurological: Alert and oriented 3. Moves all extremities 4 with equal strength. Cranial nerves II through XII grossly intact. Extremities: R knee dressing intact   Data Reviewed: I have personally reviewed following labs and imaging studies  CBC: Recent Labs  Lab 01/20/22 0159 01/21/22 0500 01/22/22 0900 01/22/22 1500 01/23/22 0535 01/24/22 0608  WBC 7.0 5.9 3.5* 5.4 5.5 5.7  NEUTROABS 4.1 3.1 2.1  --  3.2 3.4  HGB 7.8* 8.0* 12.8* 8.6* 8.1* 8.8*  HCT 23.1* 24.0* 37.1* 25.2* 25.0* 26.2*  MCV 70.2* 71.6* 69.7* 71.0* 72.5* 72.2*  PLT 196 219 143* 228 229 [id number]    Basic Metabolic Panel: Recent Labs  Lab 01/20/22 0159 01/21/22 0500 01/22/22 0037 01/22/22 0900 01/22/22 1500 01/23/22 0535 01/24/22 0608  NA 139 137  --  136 135 137 137  K 4.2 4.1  --  4.0 3.8 4.0 3.9  CL 113* 107  --  103 104 104 104  CO2 21* 24  --  23 26 24 25   GLUCOSE 106* 99  --  98 97 93 103*  BUN 20 14  --  16 16 21 22   CREATININE 1.34* 1.32*  --  1.33* 1.42* 1.59* 1.47*  CALCIUM 8.6* 9.0  --  8.9 8.8* 8.9 9.1  MG 1.7 1.5* 1.4* 2.1  --  2.1 1.9  PHOS 2.4* 2.8  --  3.4  --  3.6 3.4    GFR: Estimated Creatinine Clearance: 56.3 mL/min (Berklee Battey) (by C-G formula based on SCr of 1.47 mg/dL (H)).  Liver Function Tests: Recent Labs  Lab 01/20/22 0159 01/21/22 0500 01/22/22 0900 01/23/22 0535 01/24/22 0608  AST 15 17 13* 14* 14*  ALT 17 16 14 13 14   ALKPHOS 57 60 68 67 71  BILITOT 0.6 0.5 0.6 0.6 1.1   PROT 6.2* 6.5 6.8 6.7 7.3  ALBUMIN 2.2* 2.2* 2.3* 2.3* 2.4*    CBG: No results for input(s): "GLUCAP" in the last 168 hours.    Recent Results (from the past 240 hour(s))  Blood Culture (routine x 2)     Status: None   Collection Time: 01/14/22 10:11 PM   Specimen: BLOOD  Result Value Ref Range Status   Specimen Description BLOOD BLOOD RIGHT ARM  Final   Special Requests   Final    BOTTLES DRAWN AEROBIC AND ANAEROBIC Blood Culture results may not be optimal due to an inadequate volume of blood received in culture bottles   Culture   Final    NO GROWTH 5 DAYS Performed at [facility], [place] [address], [place], [place] [address]    Report Status 01/19/2022 FINAL  Final  Body fluid culture w Gram Stain     Status: None   Collection Time: 01/15/22  3:08 PM   Specimen: Body Fluid  Result Value Ref Range Status   Specimen Description FLUID  Final   Special Requests RIGHT KNEE ASPIRATION  Final   Gram Stain   Final    MODERATE WBC PRESENT, PREDOMINANTLY PMN NO ORGANISMS SEEN    Culture   Final    NO GROWTH 3 DAYS Performed at [facility], [address] [place], [place] [address]    Report Status 01/19/2022 FINAL  Final  Respiratory (~20 pathogens) panel by PCR     Status: None   Collection Time: 01/16/22 12:03 AM   Specimen: Nasopharyngeal Swab; Respiratory  Result Value Ref Range Status   Adenovirus NOT DETECTED NOT DETECTED Final   Coronavirus 229E NOT DETECTED NOT DETECTED Final    Comment: (NOTE) The Coronavirus on the Respiratory Panel, DOES NOT test for the novel  Coronavirus (2019 nCoV)    Coronavirus HKU1 NOT DETECTED NOT DETECTED Final   Coronavirus  NL63 NOT DETECTED NOT DETECTED Final   Coronavirus OC43 NOT DETECTED NOT DETECTED Final   Metapneumovirus NOT DETECTED NOT DETECTED Final   Rhinovirus / Enterovirus NOT DETECTED NOT DETECTED Final   Influenza Fairy [place] NOT DETECTED NOT DETECTED Final   Influenza B NOT DETECTED NOT DETECTED Final    Parainfluenza Virus 1 NOT DETECTED NOT DETECTED Final   Parainfluenza Virus 2 NOT DETECTED NOT DETECTED Final   Parainfluenza Virus 3 NOT DETECTED NOT DETECTED Final   Parainfluenza Virus 4 NOT DETECTED NOT DETECTED Final   Respiratory Syncytial Virus NOT DETECTED NOT DETECTED Final   Bordetella pertussis NOT DETECTED NOT DETECTED Final   Bordetella Parapertussis NOT DETECTED NOT DETECTED Final   Chlamydophila pneumoniae NOT DETECTED NOT DETECTED Final   Mycoplasma pneumoniae NOT DETECTED NOT DETECTED Final    Comment: Performed at [facility], [place] [address], [place], [place] [address]  Aerobic/Anaerobic Culture w Gram Stain (surgical/deep wound)     Status: None   Collection Time: 01/17/22  9:18 AM   Specimen: Synovial, Right Knee; Body Fluid  Result Value Ref Range Status   Specimen Description KNEE  Final   Special Requests  RIGHT, JOINT FLUID  Final   Gram Stain   Final    MODERATE WBC PRESENT,BOTH PMN AND MONONUCLEAR NO ORGANISMS SEEN    Culture   Final    RARE STAPHYLOCOCCUS AURICULARIS NO ANAEROBES ISOLATED Performed at [facility], [address], [place], [place] [address]    Report Status 01/22/2022 FINAL  Final   Organism ID, Bacteria STAPHYLOCOCCUS AURICULARIS  Final      Susceptibility   Staphylococcus auricularis - MIC*    CIPROFLOXACIN <=0.5 SENSITIVE Sensitive     ERYTHROMYCIN 0.5 SENSITIVE Sensitive     GENTAMICIN <=0.5 SENSITIVE Sensitive     OXACILLIN RESISTANT Resistant     TETRACYCLINE <=1 SENSITIVE Sensitive     VANCOMYCIN 1 SENSITIVE Sensitive     TRIMETH/SULFA <=10 SENSITIVE Sensitive     CLINDAMYCIN <=0.25 SENSITIVE Sensitive     RIFAMPIN <=0.5 SENSITIVE Sensitive     Inducible Clindamycin NEGATIVE Sensitive     * RARE STAPHYLOCOCCUS AURICULARIS  Aerobic/Anaerobic Culture w Gram Stain (surgical/deep wound)     Status: None   Collection Time: 01/17/22  9:21 AM   Specimen: Synovial, Right Knee; Tissue  Result Value Ref Range Status    Specimen Description TISSUE  Final   Special Requests  RIGHT KNEE  Final   Gram Stain   Final    FEW WBC PRESENT, PREDOMINANTLY PMN NO ORGANISMS SEEN    Culture   Final    RARE STAPHYLOCOCCUS COHNII NO ANAEROBES ISOLATED Performed at [facility], [address] [place], [place] [address]    Report Status 01/22/2022 FINAL  Final   Organism ID, Bacteria STAPHYLOCOCCUS COHNII  Final      Susceptibility   Staphylococcus cohnii - MIC*    CIPROFLOXACIN <=0.5 SENSITIVE Sensitive     ERYTHROMYCIN >=8 RESISTANT Resistant     GENTAMICIN <=0.5 SENSITIVE Sensitive     OXACILLIN 2 RESISTANT Resistant     TETRACYCLINE >=16 RESISTANT Resistant     VANCOMYCIN 1 SENSITIVE Sensitive     TRIMETH/SULFA 80 RESISTANT Resistant     CLINDAMYCIN RESISTANT Resistant     RIFAMPIN <=0.5 SENSITIVE Sensitive     Inducible Clindamycin POSITIVE Resistant     * RARE STAPHYLOCOCCUS COHNII         Radiology Studies: No results found.  Scheduled Meds:  allopurinol  100 mg Oral Daily   apixaban  5 mg Oral BID   aspirin  81 mg Oral Daily   atorvastatin  20 mg Oral Daily   carvedilol  25 mg Oral BID WC   Chlorhexidine Gluconate Cloth  6 each Topical Daily   cloNIDine  0.2 mg Oral TID   dapagliflozin propanediol  10 mg Oral Daily   ferrous sulfate  325 mg Oral QODAY   hydrALAZINE  50 mg Oral Q8H   irbesartan  300 mg Oral Daily   sodium chloride flush  10-40 mL Intracatheter Q12H   Continuous Infusions:  sodium chloride Stopped (01/19/22 1511)   DAPTOmycin (CUBICIN) 650 mg in sodium chloride 0.9 % IVPB 650 mg (01/23/22 2031)     LOS: 9 days    Time spent: over 30 min    Fayrene Helper, MD [facility]   To contact the attending provider between 7A-7P or the covering provider during after hours 7P-7A, please log into the web site [email or web address] and access using universal [place] password for that web site. If you do not have the password, please call the hospital  operator.  01/24/2022, 6:54 PM

## 2022-01-24 NOTE — Progress Notes (Signed)
PT Cancellation Note  Patient Details Name: Tate Zagal MRN: [id number] DOB: 1960/10/20   Cancelled Treatment:    Reason Eval/Treat Not Completed: Patient declined, no reason specified (pt stating having just walked to bathroom and sitting up for breakfast, declined at this time)   Lamarr Lulas 01/24/2022, 9:38 AM [place] Office: [phone]

## 2022-01-25 ENCOUNTER — Other Ambulatory Visit (HOSPITAL_COMMUNITY): Payer: Self-pay

## 2022-01-25 DIAGNOSIS — R6521 Severe sepsis with septic shock: Secondary | ICD-10-CM | POA: Diagnosis not present

## 2022-01-25 DIAGNOSIS — A419 Sepsis, unspecified organism: Secondary | ICD-10-CM | POA: Diagnosis not present

## 2022-01-25 DIAGNOSIS — I350 Nonrheumatic aortic (valve) stenosis: Secondary | ICD-10-CM | POA: Diagnosis not present

## 2022-01-25 DIAGNOSIS — N179 Acute kidney failure, unspecified: Secondary | ICD-10-CM | POA: Diagnosis not present

## 2022-01-25 LAB — CBC WITH DIFFERENTIAL/PLATELET
Abs Immature Granulocytes: 0.05 10*3/uL (ref 0.00–0.07)
Basophils Absolute: 0 10*3/uL (ref 0.0–0.1)
Basophils Relative: 0 %
Eosinophils Absolute: 0.1 10*3/uL (ref 0.0–0.5)
Eosinophils Relative: 2 %
HCT: 26.2 % — ABNORMAL LOW (ref 39.0–52.0)
Hemoglobin: 8.4 g/dL — ABNORMAL LOW (ref 13.0–17.0)
Immature Granulocytes: 1 %
Lymphocytes Relative: 26 %
Lymphs Abs: 1.9 10*3/uL (ref 0.7–4.0)
MCH: 23.1 pg — ABNORMAL LOW (ref 26.0–34.0)
MCHC: 32.1 g/dL (ref 30.0–36.0)
MCV: 72.2 fL — ABNORMAL LOW (ref 80.0–100.0)
Monocytes Absolute: 0.8 10*3/uL (ref 0.1–1.0)
Monocytes Relative: 11 %
Neutro Abs: 4.4 10*3/uL (ref 1.7–7.7)
Neutrophils Relative %: 60 %
Platelets: 271 10*3/uL (ref 150–400)
RBC: 3.63 MIL/uL — ABNORMAL LOW (ref 4.22–5.81)
RDW: 16.6 % — ABNORMAL HIGH (ref 11.5–15.5)
WBC: 7.2 10*3/uL (ref 4.0–10.5)
nRBC: 0 % (ref 0.0–0.2)

## 2022-01-25 LAB — COMPREHENSIVE METABOLIC PANEL
ALT: 14 U/L (ref 0–44)
AST: 15 U/L (ref 15–41)
Albumin: 2.5 g/dL — ABNORMAL LOW (ref 3.5–5.0)
Alkaline Phosphatase: 75 U/L (ref 38–126)
Anion gap: 7 (ref 5–15)
BUN: 24 mg/dL — ABNORMAL HIGH (ref 8–23)
CO2: 25 mmol/L (ref 22–32)
Calcium: 9.2 mg/dL (ref 8.9–10.3)
Chloride: 105 mmol/L (ref 98–111)
Creatinine, Ser: 1.69 mg/dL — ABNORMAL HIGH (ref 0.61–1.24)
GFR, Estimated: 45 mL/min — ABNORMAL LOW (ref >60)
Glucose, Bld: 102 mg/dL — ABNORMAL HIGH (ref 70–99)
Potassium: 3.9 mmol/L (ref 3.5–5.1)
Sodium: 137 mmol/L (ref 135–145)
Total Bilirubin: 1.1 mg/dL (ref 0.3–1.2)
Total Protein: 7.4 g/dL (ref 6.5–8.1)

## 2022-01-25 MED ORDER — ALLOPURINOL 100 MG PO TABS
100.0000 mg | ORAL_TABLET | Freq: Every day | ORAL | 1 refills | Status: DC
  Filled 2022-01-25: qty 30, 30d supply, fill #0

## 2022-01-25 MED ORDER — CARVEDILOL 12.5 MG PO TABS
12.5000 mg | ORAL_TABLET | Freq: Two times a day (BID) | ORAL | 1 refills | Status: DC
  Filled 2022-01-25: qty 60, 30d supply, fill #0

## 2022-01-25 MED ORDER — POTASSIUM CHLORIDE CRYS ER 20 MEQ PO TBCR: 20.0000 meq | EXTENDED_RELEASE_TABLET | Freq: Every day | ORAL | Status: DC | PRN

## 2022-01-25 MED ORDER — OXYCODONE HCL 5 MG PO TABS
5.0000 mg | ORAL_TABLET | Freq: Four times a day (QID) | ORAL | 0 refills | Status: AC | PRN
  Filled 2022-01-25: qty 12, 3d supply, fill #0

## 2022-01-25 MED ORDER — HYDRALAZINE HCL 25 MG PO TABS
25.0000 mg | ORAL_TABLET | Freq: Three times a day (TID) | ORAL | 1 refills | Status: DC
  Filled 2022-01-25: qty 90, 30d supply, fill #0

## 2022-01-25 MED ORDER — ATORVASTATIN CALCIUM 20 MG PO TABS: 20.0000 mg | ORAL_TABLET | Freq: Every day | ORAL | Status: DC

## 2022-01-25 MED ORDER — DAPTOMYCIN IV (FOR PTA / DISCHARGE USE ONLY): 650.0000 mg | INTRAVENOUS | 0 refills | Status: DC

## 2022-01-25 MED ORDER — CLONIDINE HCL 0.1 MG PO TABS
0.1000 mg | ORAL_TABLET | Freq: Three times a day (TID) | ORAL | 1 refills | Status: DC
  Filled 2022-01-25: qty 90, 30d supply, fill #0

## 2022-01-25 NOTE — Progress Notes (Signed)
Rounding Note    Patient Name: [name] Date of Encounter: 01/25/2022  [place] Cardiologist: Donato Heinz, MD   Subjective   Patient appears comfortable in bed this morning. States that he has no nausea or dizziness today. Continues to deny chest pain, shortness of breath. Patient has increased ambulation and feels better with less exertional limitation.  Inpatient Medications    Scheduled Meds:  allopurinol  100 mg Oral Daily   apixaban  5 mg Oral BID   aspirin  81 mg Oral Daily   atorvastatin  20 mg Oral Daily   carvedilol  12.5 mg Oral BID WC   Chlorhexidine Gluconate Cloth  6 each Topical Daily   cloNIDine  0.1 mg Oral TID   dapagliflozin propanediol  10 mg Oral Daily   ferrous sulfate  325 mg Oral QODAY   hydrALAZINE  25 mg Oral Q8H   irbesartan  300 mg Oral Daily   sodium chloride flush  10-40 mL Intracatheter Q12H   Continuous Infusions:  sodium chloride Stopped (01/19/22 1511)   DAPTOmycin (CUBICIN) 650 mg in sodium chloride 0.9 % IVPB 650 mg (01/24/22 2100)   PRN Meds: sodium chloride, acetaminophen **OR** acetaminophen, albuterol, fentaNYL (SUBLIMAZE) injection, hydrOXYzine, labetalol, ondansetron **OR** ondansetron (ZOFRAN) IV, oxyCODONE, sodium chloride flush   Vital Signs    Vitals:   01/24/22 2246 01/25/22 0029 01/25/22 0546 01/25/22 0602  BP: 119/77 122/85  (!) 141/91  Pulse:  86    Resp:  20 20   Temp:  99.5 F (37.5 C) 98.9 F (37.2 C)   TempSrc:  Oral Oral   SpO2:  95% 96%   Weight:   83.1 kg   Height:        Intake/Output Summary (Last 24 hours) at 01/25/2022 0744 Last data filed at 01/24/2022 1916 Gross per 24 hour  Intake --  Output 400 ml  Net -400 ml      01/25/2022    5:46 AM 01/23/2022    5:19 AM 01/22/2022    4:28 AM  Last 3 Weights  Weight (lbs) 183 lb 4.8 oz 187 lb 3.2 oz 190 lb 14.4 oz  Weight (kg) 83.144 kg 84.913 kg 86.592 kg      Telemetry    Rate controlled afib, 60s-low 100s -  Personally Reviewed  ECG    No new tracing  Physical Exam   GEN: No acute distress.   Neck: No JVD Cardiac: Irregular rhythm, 3/6 systolic murmur. No rubs, or gallops.  Respiratory: Clear to auscultation bilaterally. GI: Soft, nontender, non-distended  MS: No edema; No deformity. Neuro:  Nonfocal  Psych: Normal affect   Labs    High Sensitivity Troponin:   Recent Labs  Lab 01/13/22 0424 01/14/22 2128 01/15/22 0150  TROPONINIHS 16 29* 29*     Chemistry Recent Labs  Lab 01/22/22 0900 01/22/22 1500 01/23/22 0535 01/24/22 0608 01/25/22 0240  NA 136   < > 137 137 137  K 4.0   < > 4.0 3.9 3.9  CL 103   < > 104 104 105  CO2 23   < > 24 25 25   GLUCOSE 98   < > 93 103* 102*  BUN 16   < > 21 22 24*  CREATININE 1.33*   < > 1.59* 1.47* 1.69*  CALCIUM 8.9   < > 8.9 9.1 9.2  MG 2.1  --  2.1 1.9  --   PROT 6.8  --  6.7 7.3 7.4  ALBUMIN  2.3*  --  2.3* 2.4* 2.5*  AST 13*  --  14* 14* 15  ALT 14  --  13 14 14   ALKPHOS 68  --  67 71 75  BILITOT 0.6  --  0.6 1.1 1.1  GFRNONAA >60   < > 49* 54* 45*  ANIONGAP 10   < > 9 8 7    < > = values in this interval not displayed.    Lipids No results for input(s): "CHOL", "TRIG", "HDL", "LABVLDL", "LDLCALC", "CHOLHDL" in the last 168 hours.  Hematology Recent Labs  Lab 01/23/22 0535 01/24/22 0608 01/25/22 0240  WBC 5.5 5.7 7.2  RBC 3.45* 3.63* 3.63*  HGB 8.1* 8.8* 8.4*  HCT 25.0* 26.2* 26.2*  MCV 72.5* 72.2* 72.2*  MCH 23.5* 24.2* 23.1*  MCHC 32.4 33.6 32.1  RDW 17.1* 17.2* 16.6*  PLT 229 240 271   Thyroid No results for input(s): "TSH", "FREET4" in the last 168 hours.  BNP Recent Labs  Lab 01/19/22 0211 01/22/22 0900  BNP 478.3* 407.4*    DDimer No results for input(s): "DDIMER" in the last 168 hours.   Radiology    No results found.  Cardiac Studies   Cath 05/27/2021   Ost RCA to Prox RCA lesion is 40% stenosed.   Prox RCA to Mid RCA lesion is 20% stenosed.   RPDA lesion is 30% stenosed.   RPAV lesion is  50% stenosed.   Dist Cx lesion is 50% stenosed.   2nd Diag lesion is 90% stenosed.   Prox LAD to Mid LAD lesion is 30% stenosed.   Dist LAD lesion is 90% stenosed.   The LAD is a large caliber vessel that courses to the apex. There is mild non-obstructive disease in the proximal and mid LAD. The apical LAD has diffuse severe stenosis. The first Diagonal is a moderate caliber vessel with mild non-obstructive disease. The second Diagonal branch is a moderate caliber vessel with a focal severe stenosis The Circumflex is a large caliber vessel with moderate distal stenosis The RCA is a large dominant artery with mild eccentric proximal stenosis. The PDA and posterolateral arteries have moderate diffuse disease.  Severe aortic stenosis by echo. The aortic valve was not crossed today.    RA 14 RV 42/12/19 PA 44/22 (mean 27) PCWP 19 AO 103/64   Will continue workup for AVR. Given his severe LVH and young age, he may be better treated with surgical AVR than TAVR. Will review with our valve team and make arrangements for CT scans if felt necessary before being seen by our CT surgeon.      Echo 12/28/2021 1. Left ventricular ejection fraction, by estimation, is 60 to 65%. The  left ventricle has normal function. The left ventricle has no regional  wall motion abnormalities. There is severe concentric left ventricular  hypertrophy. Left ventricular diastolic   function could not be evaluated. Elevated left atrial pressure.   2. Right ventricular systolic function is normal. The right ventricular  size is normal. There is mildly elevated pulmonary artery systolic  pressure.   3. Left atrial size was severely dilated.   4. Right atrial size was mildly dilated.   5. The mitral valve is normal in structure. Mild mitral valve  regurgitation. No evidence of mitral stenosis.   6. The aortic valve is tricuspid. Aortic valve regurgitation is mild.  Moderate to severe aortic valve stenosis.   7. Aortic  dilatation noted. There is mild dilatation of the ascending  aorta,  measuring 40 mm.   8. The inferior vena cava is normal in size with greater than 50%  respiratory variability, suggesting right atrial pressure of 3 mmHg  Patient Profile     61 y.o. male with PMH of chronic diastolic CHF, resistant HTN, moderate to severe AS, PAF, HLD and PAD who had recent S pyogenes bacteremia and R leg cellulitis and is now admitted with sepsis secondary to R septic arthritis of knee. Cardiology consulted for afib with RVR since 01/16/2022.   Assessment & Plan    Persistent Atrial Fibrillation with RVR   Patient with afib and RVR prompting cardiology consult on 10/17. Patient initially managed with IV Cardizem, now receiving Carvedilol 12.5mg  BID (decreased by primary team). Eliquis resumed on 10/20 following I/D with bleeding on 10/16.   Rates remain well controlled. Continue Carvedilol.  Continue Eliquis Maze procedure planned during future AVR surgery   Severe Aortic Stenosis   Patient with symptomatic AS, TTE on 12/28/21 shows EF 60-65%, moderate to severe AS with dilated ascending aorta and AV mean gradient 29 mmHg, max gradient 85mmHg. He was originally planned for AVR this past April, delayed due to a leg wound. Following evaluation by [name] 12/23/21, plan is to schedule AVR, CABG to diag, biatrial MAZE. Follow up appointment with [name] scheduled for 01/26/22. From a cardiac perspective, it is very important that patient make this appointment. Volume status will continue to be tenuous until valve can be repaired.   Chronic Diastolic Congestive Heart Failure   Left ventricle severely hypertrophic, left atria severely dilated on 12/28/21 TTE. BNP 407.4 on 01/22/22. Home GDMT included Farxiga, Lasix (in chart as 60mg  TID, patient only taking 40mg  TID). Lasix held earlier in the admission, resumed at 40mg  QD, then held again in the setting of symptomatic low BP.   Continue home GDMT.  Recommend at least 24 hour lasix holiday given elevated Creatinine. Patiently likely to be very preload dependent and symptoms of low BP yesterday could have been a result of AS/decreased volume. Consider PRN dosing of lasix with weight gain vs daily dose.   Resistant Hypertension   Patient taking Clonidine, Hydralazine, Coreg, Olmesartan. BP continues to appear well controlled this admission on this regimen. In fact, some low normal BP noted yesterday by PT resulting in decrease htn regimen by primary team. Given significant AS, would be okay to allow higher baseline BP 2/2 preload dependence.   Acute on Chronic CKD   Patient with Creatinine up to 2.4 earlier this admission, improved to 1.47 but back up to 1.69 today. Recommend Lasix holiday.   Septic arthritis, right knee Group A strep   Patient s/p I&D with ortho on 10/16. Infectious disease is now following and managing antibiotic therapy. WBC remains WNL.        For questions or updates, please contact [place] Please consult [email or web address] for contact info under        Signed, Lily Kocher, PA-C  01/25/2022, 7:44 AM

## 2022-01-25 NOTE — Discharge Summary (Signed)
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 male with [name] history of severe AS, HFpEF, AFib on eliquis, stage IIIa CKD, HTN, HLD, OSA not on CPAP who presented for the 2nd time in 2 days on 10/13 with increasing right knee pain. He had been admitted 9/30 with group Chandani Rogowski streptococcal bacteremia, ARF and RLE cellulitis as well as suspected gout of the right knee treated with steroid injection. With negative echo, penicillin and linezolid were given with improvement, transitioned to amoxicillin for 14 days per ID and discharged 10/5. He returned ultimately 10/13 with worsening pain and lethargy. He was septic with hypotension responsive to resuscitation, leukocytosis (WBC 14), AKI (SCr 2.4 from 1.5). IV antibiotics given, cultures drawn, and ultimately MR right knee revealed mod-large complex effusion, synovial thickening, lateral femoral condyle focal erosion, minimal adjacent marrow edema, extensive muscular edema, quadriceps tendinosis with partial-thickness tear.  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  Surgical cultures ended up growing coag negative staph.  He was transitioned to daptomycin to treat septic arthritis.  Plan for outpatient IV abx and ID follow up.  Hospitalization complicated by orthostasis which has improved with reducing BP meds.  Hospital Course:  Assessment and Plan: Severe sepsis with AKI  Recent Group Ashe Gago Strep Bacteremia and Right Leg Cellulitis Septic Arthritis of R Knee:  - With recent history of S. pyogenes bacteremia, plan was oral amoxicillin 1g q8h through 10/15. Will request ID input regarding this.  - appreciate ID recs, recommending treatment for presumed septic arthritis of R knee -> PICC, IV daptomycin  - outpatient ID follow up   CoNS Septic Arthritis of Right Knee (staph cohnii and staph auricularis)  - s/p arthrotomy R knee with closure over drain and application of biologic graft using kerecis to the right leg per orthopedics 10/16. 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  - rare staph cohnii from synovial tissue, staph auricularis -> abx changed to daptomycin x4 weeks - will need ID follow up outpatient   Tophaceous Gout Flare - will start allopurinol with crystals seen on synovial fluid  - follow with orthopedics outpatient   Nausea  Dyspnea on Exertion  Orthostatic Hypotension  - orthostasis when working with therapy, HR up to 140 with activity, though BP apparently low but without fall - will hold lasix, drop dose of clonidine, hydralazine, and coreg - CXR without acute pulm process - ? If symptoms related to aortic stenosis - appreciate cardiology assistance - overall seems improved   AKI on stage IIIa CKD:  - Avoid nephrotoxins.    Persistent AFib with RVR: Rate stabilized -  Continue coreg, cardizem - eliquis    HTN  Chronic HFpEF  Aortic stenosis: - Coreg dose halved to 12.15 mg BID, benicar 40 mg, diltiazem  227m q6h, clonidine dose halved to 0.1 mg TID, hydralazine dose halved to 25 mg TID- appreciate cardiology assistance  - holding lasix at discharge, resume prn dosing at home - BNP down from prior, required IVF resuscitation, will avoid hypotension and monitor I/O, volume status closely  - Follow up with Dr. BCyndia Bentafter all infection has been treated to pursue AVR. Also discussing MAZE. Has Wealthy Danielski follow up appointment tomorrow with CT surgery.   CAD: No angina. Plan per cardiology.    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  Arthrotomy, right knee with closure over drain. 2.  Application of biologic graft using Kerecis to the right leg.  LE [place]:  RIGHT:  - There is no evidence of deep vein thrombosis in the lower extremity.  Appearance of fluid collection in the joint space on the medial aspect of  the knee.     LEFT:  - No evidence of common femoral vein obstruction.     Consultations: Cardiology Orthopedics ID  Discharge Exam: Vitals:   01/25/22 1523 01/25/22 1605  BP: 113/81 118/74  Pulse:    Resp: 15   Temp: 98.7 F (37.1 C)   SpO2:     Feels better today Less lightheaded Feels comfortable with d/c today  General: No acute distress. Cardiovascular: irregularly irregular Lungs: unlabored Abdomen: Soft, nontender, nondistended Neurological: Alert and oriented 3. Moves all extremities 4 with equal strength. Cranial nerves II through XII grossly intact. Extremities: dressing to right knee Discharge Instructions   Discharge Instructions     [facility] pharmacist to adjust dose for Vancomycin, Aminoglycosides and other anti-infective therapies as requested by physician.   Complete by: As directed    [facility] pharmacist to adjust dose for Vancomycin, Aminoglycosides  and other anti-infective therapies as requested by physician.   Complete by: As directed    [facility] to provide Cath Flo 227m  Complete by: As directed    Administer for PICC line occlusion and as ordered by physician for other access device issues.   [facility] to provide Cath Flo 27m29m Complete by: As directed    Administer for PICC line occlusion and as ordered by physician for other access device issues.   Ambulatory referral to Vascular Surgery   Complete by: As directed    Anaphylaxis Kit: Provided to treat any anaphylactic reaction to the medication being provided to the patient if First Dose or when requested by physician   Complete by: As directed    Epinephrine 1mg20m vial / amp: Administer 0.3mg 55m3ml) 68mcutaneously once for moderate to severe anaphylaxis, nurse to call physician and pharmacy when reaction occurs and call 911 if needed for immediate care   Diphenhydramine 50mg/m63m vial: Administer 25-50mg IV78mPRN for first dose reaction, rash, itching, mild reaction, nurse to call physician and pharmacy when reaction occurs   Sodium Chloride 0.9% NS 500ml IV:38minister if needed for hypovolemic blood pressure drop or as ordered by physician after call to physician with anaphylactic reaction   Anaphylaxis Kit: Provided to treat any anaphylactic reaction to the medication being provided  to the patient if First Dose or when requested by physician   Complete by: As directed    Epinephrine 8m/ml vial / amp: Administer 0.341m(0.67m26msubcutaneously once for moderate to severe anaphylaxis, nurse to call physician and pharmacy when reaction occurs and call 911 if needed for immediate care   Diphenhydramine 17m39m IV vial: Administer 25-17mg62mIM PRN for first dose reaction, rash, itching, mild reaction, nurse to call physician and pharmacy when reaction occurs   Sodium Chloride 0.9% NS 500ml 28mAdminister if needed for hypovolemic blood pressure drop or as  ordered by physician after call to physician with anaphylactic reaction   Call MD for:  difficulty breathing, headache or visual disturbances   Complete by: As directed    Call MD for:  extreme fatigue   Complete by: As directed    Call MD for:  hives   Complete by: As directed    Call MD for:  persistant dizziness or light-headedness   Complete by: As directed    Call MD for:  persistant nausea and vomiting   Complete by: As directed    Call MD for:  redness, tenderness, or signs of infection (pain, swelling, redness, odor or green/yellow discharge around incision site)   Complete by: As directed    Call MD for:  severe uncontrolled pain   Complete by: As directed    Call MD for:  temperature >100.4   Complete by: As directed    Change dressing on IV access line weekly and PRN   Complete by: As directed    Change dressing on IV access line weekly and PRN   Complete by: As directed    Diet - low sodium heart healthy   Complete by: As directed    Discharge instructions   Complete by: As directed    You were seen for an infection of your right knee.    This was treated surgically by orthopedics.  You should follow up outpatient with orthopedics.   You have been started on antibiotics for your septic arthritis of your knee (infection of the knee joint).    Your knee also showed evidence of gout and we started your allopurinol.  You'll need to follow up with your outpatient doctor regarding the allopurinol dose and possible titration.  Your blood pressure medicines were adjusted due to lightheadedness and symptoms with standing.  Please follow the instructions for your blood pressure medicines as we've adjusted the instructions and changed the dosing.  Hold your lasix for 3 days, then you can use it only as needed for weight gain (of more than 3 lbs in 1 day or 5 lbs in 1 week).  Follow up with your cardiologist outpatient for additional adjustments.  Hold your atorvastatin until you  finish your daptomycin (antitbiotic).  You should follow up with infectious disease outpatient.  It's extremely important you make it to your appointment tomorrow with CT surgery.   You have vascular issues that will need follow up with outpatient vascular.  Please follow up regarding the iliac artery findings seen incidentally on imaging.   Return for new, recurrent, or worsening symptoms.  Please ask your PCP to request records from this hospitalization so they know what was done and what the next steps will be.   Discharge wound care:   Complete by: As directed    Per ortho   Flush IV access with Sodium Chloride 0.9% and Heparin 10 units/ml or 100 units/ml   Complete by: As directed  Flush IV access with Sodium Chloride 0.9% and Heparin 10 units/ml or 100 units/ml   Complete by: As directed    Home infusion instructions - [facility]   Complete by: As directed    Instructions: Flush IV access with Sodium Chloride 0.9% and Heparin 10units/ml or 100units/ml   Change dressing on IV access line: Weekly and PRN   Instructions Cath Flo 67m: Administer for PICC Line occlusion and as ordered by physician for other access device   [facility] pharmacist to adjust dose for: Vancomycin, Aminoglycosides and other anti-infective therapies as requested by physician   Home infusion instructions - [facility]   Complete by: As directed    Instructions: Flush IV access with Sodium Chloride 0.9% and Heparin 10units/ml or 100units/ml   Change dressing on IV access line: Weekly and PRN   Instructions Cath Flo 247m Administer for PICC Line occlusion and as ordered by physician for other access device   [facility] pharmacist to adjust dose for: Vancomycin, Aminoglycosides and other anti-infective therapies as requested by physician   Increase activity slowly   Complete by: As directed    Method of administration may be changed at the discretion of home  infusion pharmacist based upon assessment of the patient and/or caregiver's ability to self-administer the medication ordered   Complete by: As directed    Method of administration may be changed at the discretion of home infusion pharmacist based upon assessment of the patient and/or caregiver's ability to self-administer the medication ordered   Complete by: As directed       Allergies as of 01/25/2022       Reactions   Adhesive [tape] Other (See Comments)   Makes the skin feel as if it is burning, will also bruise the skin. Pt. prefers paper tape   Latex Hives, Itching, Other (See Comments)   Burns skin, also        Medication List     STOP taking these medications    amoxicillin 500 MG capsule Commonly known as: AMOXIL   furosemide 40 MG tablet Commonly known as: LASIX       TAKE these medications    acetaminophen 500 MG tablet Commonly known as: TYLENOL Take 500-1,000 mg by mouth every 6 (six) hours as needed for moderate pain.   allopurinol 100 MG tablet Commonly known as: ZYLOPRIM Take 1 tablet (100 mg total) by mouth daily. Start taking on: January 26, 2022   apixaban 5 MG Tabs tablet Commonly known as: ELIQUIS Take 1 tablet (5 mg total) by mouth 2 (two) times daily.   aspirin EC 81 MG tablet Take 81 mg by mouth daily. Swallow whole.   atorvastatin 20 MG tablet Commonly known as: LIPITOR Take 1 tablet (20 mg total) by mouth daily. Resume this after your antibiotics are complete Start taking on: February 18, 2022 What changed:  additional instructions These instructions start on February 18, 2022. If you are unsure what to do until then, ask your doctor or other care provider.   carvedilol 12.5 MG tablet Commonly known as: COREG Take 1 tablet (12.5 mg total) by mouth 2 (two) times daily with Melvin Whiteford meal. What changed:  medication strength how much to take   cloNIDine 0.1 MG tablet Commonly known as: CATAPRES Take 1 tablet (0.1 mg total) by mouth 3  (three) times daily. What changed:  medication strength how much to take   daptomycin  IVPB Commonly known as: CUBICIN Inject 650 mg into  the vein daily for 28 days. Indication:  right septic knee First Dose: Yes Last Day of Therapy:  02/18/2022 Labs - Once weekly:  CBC/D, BMP, and CPK Labs - Every other week:  ESR and CRP Method of administration: IV Push Pull PICC line after completion of IV therapy Method of administration may be changed at the discretion of home infusion pharmacist based upon assessment of the patient and/or caregiver's ability to self-administer the medication ordered.   Farxiga 10 MG Tabs tablet Generic drug: dapagliflozin propanediol Take 10 mg by mouth daily.   ferrous sulfate 325 (65 FE) MG EC tablet Take 1 tablet (325 mg total) by mouth every other day. What changed: when to take this   gabapentin 400 MG capsule Commonly known as: NEURONTIN TAKE 2 CAPSULES(800 MG) BY MOUTH THREE TIMES DAILY What changed:  how much to take how to take this when to take this additional instructions   hydrALAZINE 25 MG tablet Commonly known as: APRESOLINE Take 1 tablet (25 mg total) by mouth every 8 (eight) hours. What changed:  medication strength how much to take when to take this   magnesium oxide 400 MG tablet Commonly known as: MAG-OX Take 1 tablet (400 mg total) by mouth every other day.   olmesartan 40 MG tablet Commonly known as: BENICAR Take 40 mg by mouth daily.   omeprazole 40 MG capsule Commonly known as: PRILOSEC Take 1 capsule (40 mg total) by mouth daily.   oxyCODONE 5 MG immediate release tablet Commonly known as: Oxy IR/ROXICODONE Take 1 tablet (5 mg total) by mouth every 6 (six) hours as needed for up to 3 days for moderate pain or severe pain. What changed:  when to take this reasons to take this   potassium chloride SA 20 MEQ tablet Commonly known as: KLOR-CON M Take 1 tablet (20 mEq total) by mouth daily as needed. When you  take your lasix What changed:  when to take this reasons to take this additional instructions               Discharge Care Instructions  (From admission, onward)           Start     Ordered   01/25/22 0000  Change dressing on IV access line weekly and PRN  (Home infusion instructions - [facility] )        01/25/22 1526   01/25/22 0000  Discharge wound care:       Comments: Per ortho   01/25/22 1544   01/20/22 0000  Change dressing on IV access line weekly and PRN  (Home infusion instructions - [facility] )        01/20/22 1355           Allergies  Allergen Reactions   Adhesive [Tape] Other (See Comments)    Makes the skin feel as if it is burning, will also bruise the skin. Pt. prefers paper tape   Latex Hives, Itching and Other (See Comments)    Burns skin, also    Follow-up Information     [facility] [place] Follow up.   Specialty: Home Health Services Why: Registered Nurse and Physical Therapy- office to call the patient with visit times. Contact information: [address] [place] [address] [phone]         [facility] Follow up.   Why: IV Antibiotics- [facility] to teach IV antibiotic therapy.        Shawn Basques, MD Follow  up.   Specialty: Infectious Diseases Why: follow up with infectious disease Contact information: [place] [address] [phone]         Shawn Vancouver, MD Follow up.   Specialty: Orthopedic Surgery Why: call for Ameila Weldon follow up appointment Contact information: [place] [address] [phone]         Shawn Pollack, MD Follow up.   Specialty: Cardiothoracic Surgery Why: follow up as scheduled Contact information: [address] [place] [address] [phone]                  The results of significant diagnostics from this hospitalization (including imaging, microbiology, ancillary  and laboratory) are listed below for reference.    Significant Diagnostic Studies: DG CHEST PORT 1 VIEW  Result Date: 01/21/2022 CLINICAL DATA:  Shortness of breath EXAM: PORTABLE CHEST 1 VIEW COMPARISON:  01/20/2022 FINDINGS: Unchanged position of right arm PICC with tip in the upper SVC. Unchanged cardiac and mediastinal contours. No focal pulmonary opacity. No pleural effusion or pneumothorax. No acute osseous abnormality. IMPRESSION: No acute cardiopulmonary process. Electronically Signed   By: Merilyn Baba M.D.   On: 01/21/2022 13:41   DG Chest Port 1 View  Result Date: 01/20/2022 CLINICAL DATA:  Status post PICC placement EXAM: PORTABLE CHEST 1 VIEW COMPARISON:  01/14/22 CXR FINDINGS: Interval placement of Aubry Rankin right arm PICC which terminates in the upper SVC. No pleural effusion. No pneumothorax. Unchanged cardiac and mediastinal contours. Mild cardiomegaly. no focal airspace opacity. Visualized upper abdomen is unremarkable. No displaced rib fractures. IMPRESSION: Interval placement of Rena Sweeden right arm PICC which terminates in the upper SVC. Electronically Signed   By: Marin Roberts M.D.   On: 01/20/2022 10:53   [place] EKG SITE RITE  Result Date: 01/19/2022 If Site Rite image not attached, placement could not be confirmed due to current cardiac rhythm.  MR KNEE RIGHT WO CONTRAST  Result Date: 01/16/2022 CLINICAL DATA:  Septic arthritis suspected, knee, xray done EXAM: MRI OF THE RIGHT KNEE WITHOUT CONTRAST TECHNIQUE: Multiplanar, multisequence MR imaging of the knee was performed. No intravenous contrast was administered. COMPARISON:  CT 01/02/2022.  X-[place] 01/14/2022 FINDINGS: Bones/Joint/Cartilage Moderate-large complex joint effusion with mass-like synovial thickening in the suprapatellar pouch. Areas of peripheral low T2 signal within the joint may represent hemosiderin staining from prior hemarthrosis. Focal erosion along the peripheral margin of the lateral femoral condyle adjacent to the  popliteus tendon insertion (series 4, image 21). Minimal adjacent marrow edema. Subchondral marrow edema within the lateral aspect of the patella, nonspecific. No additional sites of erosion or marrow edema. No fracture. No dislocation. Mild tricompartmental degenerative changes. Ligaments Intact cruciate and collateral ligaments. Muscles and Tendons Extensive intramuscular edema, most pronounced within the visualized distal quadriceps musculature and the proximal calf musculature. Quadriceps tendinosis with partial-thickness interstitial and insertional tearing. No full-thickness or retracted tear. Lateral patellar retinaculum is thickened and irregular. Soft tissues Extensive soft tissue edema and ill-defined fluid at the anterior and lateral aspect of the knee. No organized or drainable fluid collections within the superficial soft tissues. Generalized periarticular edema. IMPRESSION: 1. Moderate-large complex joint effusion with mass-like synovial thickening in the suprapatellar pouch. This could represent septic arthritis or changes related to inflammatory or crystalline arthropathy. Correlate with joint fluid analysis from recently performed knee aspiration. 2. Focal erosion along the peripheral margin of the lateral femoral condyle adjacent to the popliteus tendon insertion. Minimal adjacent marrow edema. This  can be seen with gout as well as osteomyelitis in the setting of infection. 3. Extensive intramuscular edema, most pronounced within the visualized distal quadriceps musculature and the proximal calf musculature compatible with Marta Bouie nonspecific myositis. 4. Quadriceps tendinosis with partial-thickness interstitial and insertional tearing. No full-thickness or retracted tear. Electronically Signed   By: Davina Poke D.O.   On: 01/16/2022 13:53   DG Tibia/Fibula Right  Result Date: 01/15/2022 CLINICAL DATA:  Lower extremity pain and swelling, initial encounter EXAM: RIGHT TIBIA AND FIBULA - 2 VIEW  COMPARISON:  None Available. FINDINGS: No acute fracture or dislocation is noted. Diffuse vascular calcifications are seen. Soft tissues show no focal abnormality. No joint effusion is seen. IMPRESSION: No acute bony abnormality noted. Electronically Signed   By: Inez Catalina M.D.   On: 01/15/2022 01:04   DG Ankle 2 Views Right  Result Date: 01/15/2022 CLINICAL DATA:  Leg pain and swelling, initial encounter EXAM: RIGHT ANKLE - 2 VIEW COMPARISON:  None Available. FINDINGS: Calcaneal spurring is noted. Diffuse vascular calcifications are seen. No acute fracture or dislocation is noted. IMPRESSION: No acute abnormality noted. Electronically Signed   By: Inez Catalina M.D.   On: 01/15/2022 01:02   CT Angio Chest/Abd/Pel for Dissection W and/or Wo Contrast  Result Date: 01/14/2022 CLINICAL DATA:  Concern for bowel ischemia and pulmonary embolus. Altered mental status. Hypotension. EXAM: CT ANGIOGRAPHY CHEST, ABDOMEN AND PELVIS TECHNIQUE: Non-contrast CT of the chest was initially obtained. Multidetector CT imaging through the chest, abdomen and pelvis was performed using the standard protocol during bolus administration of intravenous contrast. Multiplanar reconstructed images and MIPs were obtained and reviewed to evaluate the vascular anatomy. RADIATION DOSE REDUCTION: This exam was performed according to the departmental dose-optimization program which includes automated exposure control, adjustment of the mA and/or kV according to patient size and/or use of iterative reconstruction technique. CONTRAST:  60m OMNIPAQUE IOHEXOL 350 MG/ML SOLN COMPARISON:  Radiograph earlier today. Abdominopelvic CT 07/18/2015. Prior chest CTs reviewed FINDINGS: CTA CHEST FINDINGS Cardiovascular: Tortuous thoracic aorta. Mild atherosclerosis. No aortic hematoma on unenhanced exam. No dissection or acute aortic findings. No evidence of acute aortic syndrome. Conventional branching pattern from the aortic arch with patent branch  vessels. Excellent opacification of the pulmonary arteries, no pulmonary embolus. Mild cardiomegaly. No pericardial effusion. Mediastinum/Nodes: Scattered small mediastinal lymph nodes, not enlarged by size criteria. No hilar adenopathy. No esophageal wall thickening. Lungs/Pleura: Mild emphysema. No acute airspace disease. Mild hypoventilatory changes in the dependent lungs. No features of pulmonary edema. No pleural fluid. Trachea and central bronchi are patent. Musculoskeletal: There are no acute or suspicious osseous abnormalities. No chest wall soft tissue abnormalities. Review of the MIP images confirms the above findings. CTA ABDOMEN AND PELVIS FINDINGS VASCULAR Aorta: Moderate atherosclerosis. Noncalcified plaque in the distal aorta causes less than 25% luminal narrowing. No dissection, aneurysm or acute findings. Celiac: Patent without evidence of aneurysm, dissection, vasculitis or significant stenosis. SMA: Mild noncalcified plaque at the origin without significant stenosis. Distal branch vessels are patent. No evidence of embolic disease. No dissection or acute findings. Renals: Single right and 2 left renal arteries. All renal arteries are patent. No dissection or acute findings. IMA: Patent without evidence of aneurysm, dissection, vasculitis or significant stenosis. Inflow: The left common iliac artery is tortuous and atherosclerotic with Windle Huebert short segment dissection series 5, image 227. Moderate atheromatous plaque throughout the right internal and external iliac arteries. 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 Veins: Slight  contrast refluxing into the hepatic veins and IVC. No other acute venous findings. No portal venous or mesenteric gas. Review of the MIP images confirms the above findings. NON-VASCULAR Hepatobiliary: No evidence of focal hepatic abnormality on this arterial phase exam. Liver is mildly enlarged spanning 18.7 cm cranial caudal. Partially  distended gallbladder. No biliary dilatation. Pancreas: No ductal dilatation or inflammation. Spleen: Normal in size without focal abnormality. There are multiple splenules adjacent to the spleen. Adrenals/Urinary Tract: Slight adrenal thickening but no adrenal nodule. No hydronephrosis. Small renal cysts, needing no specific imaging follow-up. No evidence of solid renal lesion. Unremarkable urinary bladder. Stomach/Bowel: The stomach is nondistended. There is no bowel obstruction. No bowel inflammation or pneumatosis. No mesenteric edema. Scattered fluid within small bowel as well as ascending and transverse colon. No associated inflammatory change. Lymphatic: Prominent right external iliac node measures 14 mm short axis series 5, image 263. There is Verdie Wilms 14 mm right upper angle node series 5, image 274. 1.5 cm low right inguinal node. There additional prominent bilateral external iliac and retroperitoneal lymph nodes. Reproductive: Prostate is unremarkable. Other: No free air. No ascites. Small fat containing umbilical hernia. Minimal fat in the inguinal canals. Musculoskeletal: Mild lower lumbar facet hypertrophy. Review of the MIP images confirms the above findings. IMPRESSION: 1. No aortic dissection or acute aortic findings. No pulmonary embolus. 2. 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 3. Scattered fluid within small bowel as well as ascending and transverse colon, can be seen with diarrheal illness. No evidence of bowel ischemia or inflammation 4. Mild hepatomegaly. 5. Moderate external iliac and inguinal adenopathy. Additional prominent bilateral iliac and retroperitoneal lymph nodes, these nodes were also seen on remote prior exam. Findings are likely reactive, but nonspecific. Aortic Atherosclerosis (ICD10-I70.0) and Emphysema (ICD10-J43.9). Electronically Signed   By: Keith Rake  M.D.   On: 01/14/2022 23:46   CT Head Wo Contrast  Result Date: 01/14/2022 CLINICAL DATA:  Altered mental status and possible sepsis EXAM: CT HEAD WITHOUT CONTRAST TECHNIQUE: Contiguous axial images were obtained from the base of the skull through the vertex without intravenous contrast. RADIATION DOSE REDUCTION: This exam was performed according to the departmental dose-optimization program which includes automated exposure control, adjustment of the mA and/or kV according to patient size and/or use of iterative reconstruction technique. COMPARISON:  01/01/2022 FINDINGS: Brain: No evidence of acute infarction, hemorrhage, hydrocephalus, extra-axial collection or mass lesion/mass effect. Mild chronic white matter ischemic changes are seen. Vascular: No hyperdense vessel or unexpected calcification. Skull: Normal. Negative for fracture or focal lesion. Sinuses/Orbits: No acute finding. Other: None. IMPRESSION: Chronic white matter ischemic changes without acute abnormality. Electronically Signed   By: Inez Catalina M.D.   On: 01/14/2022 23:35   DG Knee 2 Views Right  Result Date: 01/14/2022 CLINICAL DATA:  Swollen right knee. EXAM: RIGHT KNEE - 1-2 VIEW COMPARISON:  None Available. FINDINGS: No evidence of fracture or dislocation. Mild medial tibiofemoral joint space narrowing. Moderate patellofemoral joint space narrowing with marginal spurring and small suprapatellar joint effusion. Soft tissues are unremarkable. IMPRESSION: 1. No acute fracture or dislocation. 2. 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 Electronically Signed   By: Keane Police D.O.   On: 01/14/2022 22:17   DG Chest Port 1 View  Result Date: 01/14/2022 CLINICAL DATA:  Questionable sepsis - evaluate for abnormality EXAM: PORTABLE CHEST 1 VIEW COMPARISON:  Radiograph 2  days ago.  Chest CT 06/05/2018 FINDINGS: Unchanged cardiomegaly. Stable mediastinal contours. Aortic atherosclerosis. Slight vascular  congestion without pulmonary edema. No confluent consolidation. No pleural effusion. No pneumothorax. IMPRESSION: Cardiomegaly with slight vascular congestion. Electronically Signed   By: Keith Rake M.D.   On: 01/14/2022 22:01   VAS [place] LOWER EXTREMITY VENOUS (DVT)  Result Date: 01/13/2022  Lower Venous DVT Study Patient Name:  TAYSEN BUSHART  Date of Exam:   01/13/2022 Medical Rec #: [id number]   Accession #:    [id number] Date of Birth: January 19, 1961   Patient Gender: M Patient Age:   52 years Exam Location:  [facility] Procedure:      VAS [place] LOWER EXTREMITY VENOUS (DVT) Referring Phys: Sharyn Lull --------------------------------------------------------------------------------  Indications: Swelling, skin changes, pain to right leg with blistering of distal calf.  Comparison Study: Extensive arterial history with monophasic runoff and                   calcification bilaterally on most recent examination                   11-25-2021.                    06-18-2021 Prior right lower extremity venous was negative for                   DVT. Performing Technologist: Darlin Coco RDMS, RVT  Examination Guidelines: Mahmoud Blazejewski complete evaluation includes B-mode imaging, spectral Doppler, color Doppler, and power Doppler as needed of all accessible portions of each vessel. Bilateral testing is considered an integral part of Wister Hoefle complete examination. Limited examinations for reoccurring indications may be performed as noted. The reflux portion of the exam is performed with the patient in reverse Trendelenburg.  +---------+---------------+---------+-----------+----------+--------------+ RIGHT    CompressibilityPhasicitySpontaneityPropertiesThrombus Aging +---------+---------------+---------+-----------+----------+--------------+ CFV      Full           Yes      Yes                                 +---------+---------------+---------+-----------+----------+--------------+ SFJ      Full                                                         +---------+---------------+---------+-----------+----------+--------------+ FV Prox  Full                                                        +---------+---------------+---------+-----------+----------+--------------+ FV Mid   Full                                                        +---------+---------------+---------+-----------+----------+--------------+ FV DistalFull                                                        +---------+---------------+---------+-----------+----------+--------------+  PFV      Full                                                        +---------+---------------+---------+-----------+----------+--------------+ POP      Full           Yes      Yes                                 +---------+---------------+---------+-----------+----------+--------------+ PTV      Full                                                        +---------+---------------+---------+-----------+----------+--------------+ PERO     Full                                                        +---------+---------------+---------+-----------+----------+--------------+ Gastroc  Full                                                        +---------+---------------+---------+-----------+----------+--------------+   +----+---------------+---------+-----------+----------+--------------+ LEFTCompressibilityPhasicitySpontaneityPropertiesThrombus Aging +----+---------------+---------+-----------+----------+--------------+ CFV Full           Yes      Yes                                 +----+---------------+---------+-----------+----------+--------------+     Summary: RIGHT: - There is no evidence of deep vein thrombosis in the lower extremity. Appearance of fluid collection in the joint space on the medial aspect of the knee.  LEFT: - No evidence of common femoral vein obstruction.  *See table(s) above for  measurements and observations. Electronically signed by Orlie Pollen on 01/13/2022 at 8:08:11 PM.    Final    DG Chest 2 View  Result Date: 01/12/2022 CLINICAL DATA:  Shortness of breath EXAM: CHEST - 2 VIEW COMPARISON:  01/01/2022 FINDINGS: Lungs are clear.  No pleural effusion or pneumothorax. Cardiomegaly. Mild degenerative changes of the visualized thoracolumbar spine. IMPRESSION: No evidence of acute cardiopulmonary disease. Cardiomegaly. Electronically Signed   By: Julian Hy M.D.   On: 01/12/2022 21:46   CT KNEE RIGHT WO CONTRAST  Result Date: 01/02/2022 CLINICAL DATA:  Soft tissue infection suspected. EXAM: CT OF THE RIGHT KNEE WITHOUT CONTRAST CT OF THE RIGHT TIBIA AND FIBULA WITHOUT CONTRAST TECHNIQUE: Multidetector CT imaging of the right knee was performed according to the standard protocol. Multiplanar CT image reconstructions were also generated. Multidetector CT imaging of the right tibia and fibula was performed according to the standard protocol. Multiplanar CT image reconstructions were also generated. RADIATION DOSE REDUCTION: This exam was performed according to the departmental dose-optimization program which includes automated exposure control, adjustment of the mA and/or kV according  to patient size and/or use of iterative reconstruction technique. COMPARISON:  Right knee and right tibia and fibula radiographs 01/01/2022 FINDINGS: RIGHT KNEE: Bones/Joint/Cartilage Mild-to-moderate medial compartment joint space narrowing. Moderate superior and lateral and mild inferior medial patellar degenerative osteophytes. Mild to moderate chronic enthesopathic changes at the quadriceps insertion on the patella. No acute fracture or dislocation. No cortical erosion is seen. Ligaments Suboptimally assessed by CT. Muscles and Tendons Normal density and size of the regional musculature. Soft tissues Large joint effusion. Moderate atherosclerotic calcifications. -- RIGHT TIBIA AND FIBULA:  Bones/Joint/Cartilage There is diffuse decreased bone mineralization. The tibia and fibula cortices appear intact. No acute fracture. Ligaments Suboptimally assessed by CT. Muscles and Tendons Normal density and size of the regional musculature. Soft tissues Mild edema and swelling of the anteromedial, anterolateral, and lateral aspects of the right calf subcutaneous fat. There are dense atherosclerotic calcifications. Varicose veins are incidentally noted within the medial calf. IMPRESSION: 1. Large knee joint effusion. 2. Mild-to-moderate chronic enthesopathic changes at the quadriceps insertion on the patella. 3. Mild-to-moderate medial compartment and mild patellofemoral compartment osteoarthritis of the knee. 4. There is mild subcutaneous fat edema and swelling throughout the anterior right knee and calf. This is compatible with cellulitis. No cortical erosion is seen to indicate CT evidence of acute osteomyelitis. Electronically Signed   By: Yvonne Kendall M.D.   On: 01/02/2022 12:50   CT TIBIA FIBULA RIGHT WO CONTRAST  Result Date: 01/02/2022 CLINICAL DATA:  Soft tissue infection suspected. EXAM: CT OF THE RIGHT KNEE WITHOUT CONTRAST CT OF THE RIGHT TIBIA AND FIBULA WITHOUT CONTRAST TECHNIQUE: Multidetector CT imaging of the right knee was performed according to the standard protocol. Multiplanar CT image reconstructions were also generated. Multidetector CT imaging of the right tibia and fibula was performed according to the standard protocol. Multiplanar CT image reconstructions were also generated. RADIATION DOSE REDUCTION: This exam was performed according to the departmental dose-optimization program which includes automated exposure control, adjustment of the mA and/or kV according to patient size and/or use of iterative reconstruction technique. COMPARISON:  Right knee and right tibia and fibula radiographs 01/01/2022 FINDINGS: RIGHT KNEE: Bones/Joint/Cartilage Mild-to-moderate medial compartment  joint space narrowing. Moderate superior and lateral and mild inferior medial patellar degenerative osteophytes. Mild to moderate chronic enthesopathic changes at the quadriceps insertion on the patella. No acute fracture or dislocation. No cortical erosion is seen. Ligaments Suboptimally assessed by CT. Muscles and Tendons Normal density and size of the regional musculature. Soft tissues Large joint effusion. Moderate atherosclerotic calcifications. -- RIGHT TIBIA AND FIBULA: Bones/Joint/Cartilage There is diffuse decreased bone mineralization. The tibia and fibula cortices appear intact. No acute fracture. Ligaments Suboptimally assessed by CT. Muscles and Tendons Normal density and size of the regional musculature. Soft tissues Mild edema and swelling of the anteromedial, anterolateral, and lateral aspects of the right calf subcutaneous fat. There are dense atherosclerotic calcifications. Varicose veins are incidentally noted within the medial calf. IMPRESSION: 1. Large knee joint effusion. 2. Mild-to-moderate chronic enthesopathic changes at the quadriceps insertion on the patella. 3. Mild-to-moderate medial compartment and mild patellofemoral compartment osteoarthritis of the knee. 4. There is mild subcutaneous fat edema and swelling throughout the anterior right knee and calf. This is compatible with cellulitis. No cortical erosion is seen to indicate CT evidence of acute osteomyelitis. Electronically Signed   By: Yvonne Kendall M.D.   On: 01/02/2022 12:50   DG Tibia/Fibula Right  Result Date: 01/01/2022 CLINICAL DATA:  [id number] weakness and pain EXAM: RIGHT KNEE -  1-2 VIEW; RIGHT TIBIA AND FIBULA - 2 VIEW COMPARISON:  November 17, 2019 FINDINGS: No acute fracture or dislocation. Enthesopathic changes of the quadriceps tendon insertion on the patella. Mild degenerative changes of the patellofemoral compartment. No area of erosion or osseous destruction. No unexpected radiopaque foreign body. Vascular  calcifications. Moderate to large knee joint effusion. IMPRESSION: 1. Moderate to large knee joint effusion. 2. Otherwise no acute osseous abnormality. Electronically Signed   By: Valentino Saxon M.D.   On: 01/01/2022 21:01   DG Knee 1-2 Views Right  Result Date: 01/01/2022 CLINICAL DATA:  [id number] weakness and pain EXAM: RIGHT KNEE - 1-2 VIEW; RIGHT TIBIA AND FIBULA - 2 VIEW COMPARISON:  November 17, 2019 FINDINGS: No acute fracture or dislocation. Enthesopathic changes of the quadriceps tendon insertion on the patella. Mild degenerative changes of the patellofemoral compartment. No area of erosion or osseous destruction. No unexpected radiopaque foreign body. Vascular calcifications. Moderate to large knee joint effusion. IMPRESSION: 1. Moderate to large knee joint effusion. 2. Otherwise no acute osseous abnormality. Electronically Signed   By: Valentino Saxon M.D.   On: 01/01/2022 21:01   CT HEAD WO CONTRAST (5MM)  Result Date: 01/01/2022 CLINICAL DATA:  Mental status change. EXAM: CT HEAD WITHOUT CONTRAST TECHNIQUE: Contiguous axial images were obtained from the base of the skull through the vertex without intravenous contrast. RADIATION DOSE REDUCTION: This exam was performed according to the departmental dose-optimization program which includes automated exposure control, adjustment of the mA and/or kV according to patient size and/or use of iterative reconstruction technique. COMPARISON:  04/20/2015 FINDINGS: Brain: No evidence of acute infarction, hemorrhage, hydrocephalus, extra-axial collection or mass lesion/mass effect. There is mild diffuse low-attenuation within the subcortical and periventricular white matter compatible with chronic microvascular disease. Vascular: No hyperdense vessel or unexpected calcification. Skull: Normal. Negative for fracture or focal lesion. Sinuses/Orbits: Paranasal sinuses and mastoid air cells are clear. Other: None. IMPRESSION: 1. No acute intracranial  abnormalities. 2. Chronic microvascular disease Electronically Signed   By: Kerby Moors M.D.   On: 01/01/2022 05:14   DG CHEST PORT 1 VIEW  Result Date: 01/01/2022 CLINICAL DATA:  Weakness and confusion. EXAM: PORTABLE CHEST 1 VIEW COMPARISON:  06/18/2021. FINDINGS: Heart is enlarged and the mediastinal contour stable. Atherosclerotic calcification of the aorta is noted. Lung volumes are low resulting in vascular crowding. Mild airspace disease is present at the left lung base. No effusion or pneumothorax. No acute osseous abnormality. IMPRESSION: 1. Cardiomegaly. 2. Low lung volumes with patchy airspace disease at the left lung base, possible atelectasis or infiltrate. Electronically Signed   By: Brett Fairy M.D.   On: 01/01/2022 01:14   ECHOCARDIOGRAM COMPLETE  Result Date: 12/28/2021    ECHOCARDIOGRAM REPORT   Patient Name:   [name] Date of Exam: 12/28/2021 Medical Rec #:  [id number]  Height:       69.0 in Accession #:    [id number] Weight:       213.0 lb Date of Birth:  1960/10/05  BSA:          2.122 m Patient Age:    5 years   BP:           128/72 mmHg Patient Gender: M          HR:           82 bpm. Exam Location:  Outpatient Procedure: 2D Echo, 3D Echo, Cardiac Doppler and Color Doppler Indications:    I35.0 Aortic Stenosis  History:  Patient has prior history of Echocardiogram examinations, most                 recent 03/18/2021. CHF, PAD, Aortic Valve Disease,                 Arrythmias:Atrial Fibrillation, Signs/Symptoms:Dyspnea, Edema                 and Fatigue; Risk Factors:Sleep Apnea, Hypertension and                 Dyslipidemia. Severe Aortic Stenosis (Prior Mean 55mHG).  Sonographer:    BDeliah BostonRDCS Referring Phys: 2[place] 1. Left ventricular ejection fraction, by estimation, is 60 to 65%. The left ventricle has normal function. The left ventricle has no regional wall motion abnormalities. There is severe concentric left ventricular  hypertrophy. Left ventricular diastolic  function could not be evaluated. Elevated left atrial pressure.  2. Right ventricular systolic function is normal. The right ventricular size is normal. There is mildly elevated pulmonary artery systolic pressure.  3. Left atrial size was severely dilated.  4. Right atrial size was mildly dilated.  5. The mitral valve is normal in structure. Mild mitral valve regurgitation. No evidence of mitral stenosis.  6. The aortic valve is tricuspid. Aortic valve regurgitation is mild. Moderate to severe aortic valve stenosis.  7. Aortic dilatation noted. There is mild dilatation of the ascending aorta, measuring 40 mm.  8. The inferior vena cava is normal in size with greater than 50% respiratory variability, suggesting right atrial pressure of 3 mmHg. FINDINGS  Left Ventricle: Left ventricular ejection fraction, by estimation, is 60 to 65%. The left ventricle has normal function. The left ventricle has no regional wall motion abnormalities. The left ventricular internal cavity size was normal in size. There is  severe concentric left ventricular hypertrophy. Left ventricular diastolic function could not be evaluated due to atrial fibrillation. Left ventricular diastolic function could not be evaluated. Elevated left atrial pressure. Right Ventricle: The right ventricular size is normal. Right ventricular systolic function is normal. There is mildly elevated pulmonary artery systolic pressure. The tricuspid regurgitant velocity is 3.22 m/s, and with an assumed right atrial pressure of 3 mmHg, the estimated right ventricular systolic pressure is 493.7mmHg. Left Atrium: Left atrial size was severely dilated. Right Atrium: Right atrial size was mildly dilated. Pericardium: There is no evidence of pericardial effusion. Mitral Valve: The mitral valve is normal in structure. There is mild calcification of the mitral valve leaflet(s). Mild mitral valve regurgitation. No evidence of mitral  valve stenosis. Tricuspid Valve: The tricuspid valve is normal in structure. Tricuspid valve regurgitation is mild . No evidence of tricuspid stenosis. Aortic Valve: The aortic valve is tricuspid. Aortic valve regurgitation is mild. Moderate to severe aortic stenosis is present. Aortic valve mean gradient measures 29.0 mmHg. Aortic valve peak gradient measures 60.0 mmHg. Aortic valve area, by VTI measures 0.97 cm. Pulmonic Valve: The pulmonic valve was normal in structure. Pulmonic valve regurgitation is trivial. No evidence of pulmonic stenosis. Aorta: Aortic dilatation noted. There is mild dilatation of the ascending aorta, measuring 40 mm. Venous: The inferior vena cava is normal in size with greater than 50% respiratory variability, suggesting right atrial pressure of 3 mmHg. IAS/Shunts: No atrial level shunt detected by color flow Doppler.  LEFT VENTRICLE PLAX 2D LVIDd:         3.90 cm   Diastology LVIDs:  2.10 cm   LV e' medial:    6.59 cm/s LV PW:         1.55 cm   LV E/e' medial:  16.1 LV IVS:        1.75 cm   LV e' lateral:   10.60 cm/s LVOT diam:     2.10 cm   LV E/e' lateral: 10.0 LV SV:         74 LV SV Index:   35 LVOT Area:     3.46 cm                           3D Volume EF:                          3D EF:        68 %                          LV EDV:       125 ml                          LV ESV:       40 ml                          LV SV:        85 ml RIGHT VENTRICLE RV Basal diam:  3.40 cm RV S prime:     11.50 cm/s TAPSE (M-mode): 1.4 cm LEFT ATRIUM              Index        RIGHT ATRIUM           Index LA diam:        5.50 cm  2.59 cm/m   RA Area:     26.20 cm LA Vol (A2C):   192.0 ml 90.48 ml/m  RA Volume:   81.00 ml  38.17 ml/m LA Vol (A4C):   174.0 ml 82.00 ml/m LA Biplane Vol: 188.0 ml 88.59 ml/m  AORTIC VALVE AV Area (Vmax):    0.84 cm AV Area (Vmean):   0.95 cm AV Area (VTI):     0.97 cm AV Vmax:           387.20 cm/s AV Vmean:          238.200 cm/s AV VTI:            0.758 m  AV Peak Grad:      60.0 mmHg AV Mean Grad:      29.0 mmHg LVOT Vmax:         94.15 cm/s LVOT Vmean:        65.500 cm/s LVOT VTI:          0.213 m LVOT/AV VTI ratio: 0.28  AORTA Ao Root diam: 3.70 cm Ao Asc diam:  4.00 cm MITRAL VALVE                TRICUSPID VALVE MV Area (PHT): cm          TR Peak grad:   41.5 mmHg MV Decel Time: 253 msec     TR Vmax:        322.00 cm/s MR Peak grad: 144.5 mmHg MR Mean grad: 94.0 mmHg     SHUNTS MR Vmax:  601.00 cm/s   Systemic VTI:  0.21 m MR Vmean:     453.0 cm/s    Systemic Diam: 2.10 cm MV E velocity: 106.00 cm/s Kirk Ruths MD Electronically signed by Kirk Ruths MD Signature Date/Time: 12/28/2021/4:38:36 PM    Final     Microbiology: Recent Results (from the past 240 hour(s))  Respiratory (~20 pathogens) panel by PCR     Status: None   Collection Time: 01/16/22 12:03 AM   Specimen: Nasopharyngeal Swab; Respiratory  Result Value Ref Range Status   Adenovirus NOT DETECTED NOT DETECTED Final   Coronavirus 229E NOT DETECTED NOT DETECTED Final    Comment: (NOTE) The Coronavirus on the Respiratory Panel, DOES NOT test for the novel  Coronavirus (2019 nCoV)    Coronavirus HKU1 NOT DETECTED NOT DETECTED Final   Coronavirus NL63 NOT DETECTED NOT DETECTED Final   Coronavirus OC43 NOT DETECTED NOT DETECTED Final   Metapneumovirus NOT DETECTED NOT DETECTED Final   Rhinovirus / Enterovirus NOT DETECTED NOT DETECTED Final   Influenza Danetta Prom NOT DETECTED NOT DETECTED Final   Influenza B NOT DETECTED NOT DETECTED Final   Parainfluenza Virus 1 NOT DETECTED NOT DETECTED Final   Parainfluenza Virus 2 NOT DETECTED NOT DETECTED Final   Parainfluenza Virus 3 NOT DETECTED NOT DETECTED Final   Parainfluenza Virus 4 NOT DETECTED NOT DETECTED Final   Respiratory Syncytial Virus NOT DETECTED NOT DETECTED Final   Bordetella pertussis NOT DETECTED NOT DETECTED Final   Bordetella Parapertussis NOT DETECTED NOT DETECTED Final   Chlamydophila pneumoniae NOT DETECTED NOT  DETECTED Final   Mycoplasma pneumoniae NOT DETECTED NOT DETECTED Final    Comment: Performed at [facility], [place]. [address], [place], [place] [address]  Aerobic/Anaerobic Culture w Gram Stain (surgical/deep wound)     Status: None   Collection Time: 01/17/22  9:18 AM   Specimen: Synovial, Right Knee; Body Fluid  Result Value Ref Range Status   Specimen Description KNEE  Final   Special Requests  RIGHT, JOINT FLUID  Final   Gram Stain   Final    MODERATE WBC PRESENT,BOTH PMN AND MONONUCLEAR NO ORGANISMS SEEN    Culture   Final    RARE STAPHYLOCOCCUS AURICULARIS NO ANAEROBES ISOLATED Performed at [facility], [address] [place], [place] [address]    Report Status 01/22/2022 FINAL  Final   Organism ID, Bacteria STAPHYLOCOCCUS AURICULARIS  Final      Susceptibility   Staphylococcus auricularis - MIC*    CIPROFLOXACIN <=0.5 SENSITIVE Sensitive     ERYTHROMYCIN 0.5 SENSITIVE Sensitive     GENTAMICIN <=0.5 SENSITIVE Sensitive     OXACILLIN RESISTANT Resistant     TETRACYCLINE <=1 SENSITIVE Sensitive     VANCOMYCIN 1 SENSITIVE Sensitive     TRIMETH/SULFA <=10 SENSITIVE Sensitive     CLINDAMYCIN <=0.25 SENSITIVE Sensitive     RIFAMPIN <=0.5 SENSITIVE Sensitive     Inducible Clindamycin NEGATIVE Sensitive     * RARE STAPHYLOCOCCUS AURICULARIS  Aerobic/Anaerobic Culture w Gram Stain (surgical/deep wound)     Status: None   Collection Time: 01/17/22  9:21 AM   Specimen: Synovial, Right Knee; Tissue  Result Value Ref Range Status   Specimen Description TISSUE  Final   Special Requests  RIGHT KNEE  Final   Gram Stain   Final    FEW WBC PRESENT, PREDOMINANTLY PMN NO ORGANISMS SEEN    Culture   Final    RARE STAPHYLOCOCCUS COHNII NO ANAEROBES ISOLATED Performed at  [facility], [place] [address], [place], [place] [address]    Report Status 01/22/2022 FINAL  Final   Organism ID, Bacteria STAPHYLOCOCCUS COHNII  Final      Susceptibility   Staphylococcus cohnii -  MIC*    CIPROFLOXACIN <=0.5 SENSITIVE Sensitive     ERYTHROMYCIN >=8 RESISTANT Resistant     GENTAMICIN <=0.5 SENSITIVE Sensitive     OXACILLIN 2 RESISTANT Resistant     TETRACYCLINE >=16 RESISTANT Resistant     VANCOMYCIN 1 SENSITIVE Sensitive     TRIMETH/SULFA 80 RESISTANT Resistant     CLINDAMYCIN RESISTANT Resistant     RIFAMPIN <=0.5 SENSITIVE Sensitive     Inducible Clindamycin POSITIVE Resistant     * RARE STAPHYLOCOCCUS COHNII     Labs: Basic Metabolic Panel: Recent Labs  Lab 01/20/22 0159 01/21/22 0500 01/22/22 0037 01/22/22 0900 01/22/22 1500 01/23/22 0535 01/24/22 0608 01/25/22 0240  NA 139 137  --  136 135 137 137 137  K 4.2 4.1  --  4.0 3.8 4.0 3.9 3.9  CL 113* 107  --  103 104 104 104 105  CO2 21* 24  --  _0 GLUCOSE 106* 99  --  98 97 93 103* 102*  BUN 20 14  --  _1 24*  CREATININE 1.34* 1.32*  --  1.33* 1.42* 1.59* 1.47* 1.69*  CALCIUM 8.6* 9.0  --  8.9 8.8* 8.9 9.1 9.2  MG 1.7 1.5* 1.4* 2.1  --  2.1 1.9  --   PHOS 2.4* 2.8  --  3.4  --  3.6 3.4  --    Liver Function Tests: Recent Labs  Lab 01/21/22 0500 01/22/22 0900 01/23/22 0535 01/24/22 0608 01/25/22 0240  AST 17 13* 14* 14* 15  ALT _2 ALKPHOS 60 68 67 71 75  BILITOT 0.5 0.6 0.6 1.1 1.1  PROT 6.5 6.8 6.7 7.3 7.4  ALBUMIN 2.2* 2.3* 2.3* 2.4* 2.5*   No results for input(s): "LIPASE", "AMYLASE" in the last 168 hours. No results for input(s): "AMMONIA" in the last 168 hours. CBC: Recent Labs  Lab 01/21/22 0500 01/22/22 0900 01/22/22 1500 01/23/22 0535 01/24/22 0608 01/25/22 0240  WBC 5.9 3.5* 5.4 5.5 5.7 7.2  NEUTROABS 3.1 2.1  --  3.2 3.4 4.4  HGB 8.0* 12.8* 8.6* 8.1* 8.8* 8.4*  HCT 24.0* 37.1* 25.2* 25.0* 26.2* 26.2*  MCV 71.6* 69.7* 71.0* 72.5* 72.2* 72.2*  PLT 219 143* 228 229 240 271   Cardiac Enzymes: Recent Labs  Lab 01/21/22 0500  CKTOTAL 13*   BNP: BNP (last 3 results) Recent Labs    01/18/22 0147 01/19/22 0211 01/22/22 0900   BNP 669.1* 478.3* 407.4*    ProBNP (last 3 results) No results for input(s): "PROBNP" in the last 8760 hours.  CBG: No results for input(s): "GLUCAP" in the last 168 hours.     Signed:  Fayrene Helper MD.  [facility] 01/25/2022, 4:25 PM

## 2022-01-25 NOTE — TOC Transition Note (Signed)
Transition of Care [facility]) - CM/SW Discharge Note   Patient Details  Name: [name] MRN: [id number] Date of Birth: 1960-10-08  [facility] CM/SW Contact:  Bethena Roys, RN Phone Number: 01/25/2022, 2:57 PM   Clinical Narrative: Plan for patient to transition home today. [facility] is working on the IV antibiotics for home and [facility] will provide the [facility] RN/PT services. No further needs identified at this time.      Final next level of care: [place] Barriers to Discharge: No Barriers Identified   Patient Goals and CMS Choice Patient states their goals for this hospitalization and ongoing recovery are:: patient may need home IV antibiotics-plan to go to an extended stay hotel. Home being rennovated. CMS Medicare.gov Compare Post Acute Care list provided to:: Patient Choice offered to / list presented to : Patient, Spouse  Discharge Plan and Services   Discharge Planning Services: CM Consult Post Acute Care Choice: Home Health          DME Arranged: IV pump/equipment ([facility])     HH Arranged: RN, Disease Management, PT, IV Antibiotics HH Agency: [place] Date [place]: 01/20/22 Time [place]: 1093 Representative spoke with at [place]: Claiborne Billings   Readmission Risk Interventions    01/19/2022    3:56 PM  Readmission Risk Prevention Plan  Transportation Screening Complete  Home Care Screening Complete  Medication Review (RN CM) Complete

## 2022-01-26 ENCOUNTER — Other Ambulatory Visit: Payer: Self-pay

## 2022-01-26 ENCOUNTER — Encounter: Payer: Self-pay | Admitting: Surgery

## 2022-01-26 ENCOUNTER — Ambulatory Visit (INDEPENDENT_AMBULATORY_CARE_PROVIDER_SITE_OTHER): Payer: Medicare Other | Admitting: Surgery

## 2022-01-26 ENCOUNTER — Telehealth: Payer: Self-pay | Admitting: *Deleted

## 2022-01-26 VITALS — BP 125/87 | HR 100 | Resp 20 | Ht 69.0 in | Wt 186.0 lb

## 2022-01-26 DIAGNOSIS — I350 Nonrheumatic aortic (valve) stenosis: Secondary | ICD-10-CM | POA: Diagnosis not present

## 2022-01-26 DIAGNOSIS — N1831 Chronic kidney disease, stage 3a: Secondary | ICD-10-CM

## 2022-01-26 DIAGNOSIS — I2510 Atherosclerotic heart disease of native coronary artery without angina pectoris: Secondary | ICD-10-CM

## 2022-01-26 DIAGNOSIS — A419 Sepsis, unspecified organism: Secondary | ICD-10-CM

## 2022-01-26 DIAGNOSIS — I4891 Unspecified atrial fibrillation: Secondary | ICD-10-CM

## 2022-01-26 DIAGNOSIS — I5032 Chronic diastolic (congestive) heart failure: Secondary | ICD-10-CM

## 2022-01-26 NOTE — Progress Notes (Signed)
HPI:  The patient is a 61 year old gentleman with history of difficult to control hypertension, hyperlipidemia, chronic diastolic congestive heart failure, OSA on CPAP, recently diagnosed atrial fibrillation, gout, GERD, and aortic stenosis.  A 2D echo in January 2022 showed a severely calcified and thickened aortic valve with a mean gradient of 37.5 mmHg.  A follow-up last July showed a mean gradient of 33 mmHg.  His most recent echo on 03/18/2021 showed a mean gradient of 33.3 mmHg with peak gradient 64 mmHg.  Valve area by VTI was 1.05 cm.  Dimensionless index was 0.3.  Stroke-volume index was 33.  Left ventricular ejection fraction was 70 to 75% with severe asymmetric left ventricular hypertrophy.  Left and right atrial sizes were severely dilated.  There was mild mitral regurgitation.  He underwent a cardiac MR on 05/03/2021 showing severe LVH with no evidence of amyloidosis.  Cardiac catheterization on 05/27/2021 showed mild nonobstructive disease in the proximal and mid LAD.  There was a 90% distal LAD stenosis near the apex.  There was a 90% large second diagonal stenosis.  Left circumflex was a large vessel with moderate distal stenosis.  The right coronary artery was a large dominant vessel with mild proximal stenosis.  The PDA and posterolateral branches had moderate diffuse disease.   He was diagnosed with atrial fibrillation during his echocardiogram in December 2022 and remained in atrial fibrillation when he was seen in cardiology clinic in January.  He was started on Eliquis and continued on Coreg for rate control.   I saw him in the office on 06/11/2021 for initial consultation at that time he had a large amount of swelling in his right knee which he had injured in the past.  He had a difficult time walking on it.  I recommended open surgical aortic valve replacement and consideration of Maze procedure at that time given his young age.  He was instructed to call our office after his knee  swelling improved to schedule surgery.  Unfortunately he developed some lower extremity wounds related to the swelling.  He subsequently underwent left tibioperoneal trunk angioplasty and left peroneal artery angioplasty by Dr. Stanford Breed on 10/22/2021.  He was been going to the wound care clinic and his leg wounds completely healed.  I saw him back in the office on 12/23/2021 and ordered a repeat 2D echocardiogram since he had not had one since December 2022 with plans to schedule open surgery if there were no new findings on that echo.  Unfortunately he was readmitted on 01/14/2022 with severe sepsis and acute kidney injury.  He had group A strep bacteremia with right leg cellulitis and septic arthritis of his right knee.  He had to have his right knee opened and drained by orthopedic surgery with application of a biologic graft using Kerecis on 01/17/2022.  Cultures grew rare staph cohnii from the synovial tissue and staph auricularis.  His antibiotics were changed to daptomycin for a 4-week course.  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  He was just discharged yesterday and scheduled for a follow-up appointment with me today.  Current Outpatient Medications  Medication Sig Dispense Refill   acetaminophen (TYLENOL) 500 MG tablet Take 500-1,000 mg by mouth every 6 (six) hours as needed for moderate pain.     allopurinol (ZYLOPRIM) 100 MG tablet Take 1 tablet (100 mg total) by mouth daily. 30 tablet 1   apixaban (ELIQUIS) 5 MG TABS tablet Take 1 tablet (  5 mg total) by mouth 2 (two) times daily. 180 tablet 3   aspirin EC 81 MG tablet Take 81 mg by mouth daily. Swallow whole.     [START ON 02/18/2022] atorvastatin (LIPITOR) 20 MG tablet Take 1 tablet (20 mg total) by mouth daily. Resume this after your antibiotics are complete     carvedilol (COREG) 12.5 MG tablet Take 1 tablet (12.5 mg total) by mouth 2 (two) times daily with a meal. 60 tablet 1   cloNIDine  (CATAPRES) 0.1 MG tablet Take 1 tablet (0.1 mg total) by mouth 3 (three) times daily. 90 tablet 1   daptomycin (CUBICIN) IVPB Inject 650 mg into the vein daily for 28 days. Indication:  right septic knee First Dose: Yes Last Day of Therapy:  02/18/2022 Labs - Once weekly:  CBC/D, BMP, and CPK Labs - Every other week:  ESR and CRP Method of administration: IV Push Pull PICC line after completion of IV therapy Method of administration may be changed at the discretion of home infusion pharmacist based upon assessment of the patient and/or caregiver's ability to self-administer the medication ordered. 28 Units 0   FARXIGA 10 MG TABS tablet Take 10 mg by mouth daily.     ferrous sulfate 325 (65 FE) MG EC tablet Take 1 tablet (325 mg total) by mouth every other day. (Patient taking differently: Take 325 mg by mouth daily with breakfast.) 15 tablet 3   gabapentin (NEURONTIN) 400 MG capsule TAKE 2 CAPSULES(800 MG) BY MOUTH THREE TIMES DAILY (Patient taking differently: Take 800 mg by mouth 3 (three) times daily.) 180 capsule 2   hydrALAZINE (APRESOLINE) 25 MG tablet Take 1 tablet (25 mg total) by mouth every 8 (eight) hours. 90 tablet 1   magnesium oxide (MAG-OX) 400 MG tablet Take 1 tablet (400 mg total) by mouth every other day. 90 tablet 3   olmesartan (BENICAR) 40 MG tablet Take 40 mg by mouth daily.     omeprazole (PRILOSEC) 40 MG capsule Take 1 capsule (40 mg total) by mouth daily. 90 capsule 3   oxyCODONE (OXY IR/ROXICODONE) 5 MG immediate release tablet Take 1 tablet (5 mg total) by mouth every 6 (six) hours as needed for up to 3 days for moderate pain or severe pain. 12 tablet 0   potassium chloride SA (KLOR-CON M) 20 MEQ tablet Take 1 tablet (20 mEq total) by mouth daily as needed. When you take your lasix     No current facility-administered medications for this visit.     Physical Exam: BP 125/87   Pulse 100   Resp 20   Ht $R'5\' 9"'om$  (1.753 m)   Wt 186 lb (84.4 kg)   SpO2 100% Comment: RA   BMI 27.47 kg/m  He looks weak and is walking very slowly with a cane. Cardiac exam shows a regular rate and rhythm with a 3/6 systolic murmur along the right sternal border.  There is no diastolic murmur. Lungs are clear. There is mild bilateral lower extremity edema.  There are chronic skin changes in both lower legs from swelling.  The right knee is wrapped in a dressing.  Diagnostic Tests:  None today  Impression:  I was initially planning on open surgical AVR with maze procedure and bypass of his diagonal branch artery given his age of 61 years old.  He has had a complicated course since I saw him in March 2023 and is now being treated for septic arthritis of his right knee.  He has  limited mobility.  He has significant comorbidities including hypertension, hyperlipidemia, chronic diastolic heart failure, OSA on CPAP, atrial fibrillation, malnutrition with an albumin of 2.3-2.5, and stage III chronic kidney disease.  I think open surgical aortic valve replacement would be high risk given his comorbidities and current condition.  I doubt that he would make a good recovery after that.  I think it would be worth considering transcatheter aortic valve replacement.  He will need to recover from his septic knee arthritis before he has anything done.  He has already had a CTA of the chest, abdomen, and pelvis during his recent hospitalization.  We will get a gated cardiac CTA for further work-up for TAVR.  Then we can discuss him at our multidisciplinary heart valve team meeting to decide if TAVR is a reasonable alternative.  I discussed all of this with him and his wife and he is in agreement.  Plan:  He will have a gated cardiac CTA scheduled and we will discuss him at our multidisciplinary meeting before deciding about the feasibility of TAVR.  He will continue antibiotic treatment for septic knee arthritis and we will plan to see him back after he has finished this and been cleared by infectious  disease.  I spent 20 minutes performing this established patient evaluation and > 50% of this time was spent face to face counseling and coordinating the care of this patient's severe, symptomatic aortic stenosis.   Gaye Pollack, MD [facility] [phone]

## 2022-01-26 NOTE — Patient Outreach (Signed)
  Care Coordination [facility] Note Transition Care Management Unsuccessful Follow-up Telephone Call  Date of discharge and from where:  [id number] [facility]  Attempts:  1st Attempt  Reason for unsuccessful TCM follow-up call:  Unable to leave message mailbox not set up  [place] Management [phone]

## 2022-01-26 NOTE — Telephone Encounter (Signed)
   Telephone encounter was:  Unsuccessful.  01/26/2022 Name: [name] MRN: [id number] DOB: 07-09-1960  Unsuccessful outbound call made today to assist with:  Financial Difficulties related to housing   Outreach Attempt:  1st Attempt  A HIPAA compliant voice message was left requesting a return call.  Instructed patient to call back at [phone].  [place] [phone] [address] , [place] [address] Email : [email or web address]

## 2022-01-27 ENCOUNTER — Telehealth: Payer: Self-pay | Admitting: *Deleted

## 2022-01-27 ENCOUNTER — Telehealth: Payer: Self-pay

## 2022-01-27 ENCOUNTER — Inpatient Hospital Stay: Payer: Self-pay | Admitting: Nurse Practitioner

## 2022-01-27 NOTE — Patient Outreach (Signed)
  Care Coordination TOC Note Transition Care Management Unsuccessful Follow-up Telephone Call  Date of discharge and from where:  [facility] [phone]  Attempts:  2nd Attempt  Reason for unsuccessful TCM follow-up call:  Unable to leave message Voice mailbox has not been set up yet.   [place] [facility] Management [phone]

## 2022-01-27 NOTE — Telephone Encounter (Signed)
   Telephone encounter was:  Unsuccessful.  01/27/2022 Name: Damante Spragg MRN: [id number] DOB: 21-Sep-1960  Unsuccessful outbound call made today to assist with:  Food Insecurity  Outreach Attempt:  2nd Attempt  No voicemail setup   [place] [phone] [address] , [place] [address] Email : [email or web address]

## 2022-01-27 NOTE — Patient Outreach (Signed)
  Care Coordination [facility] Note Transition Care Management Unsuccessful Follow-up Telephone Call  Date of discharge and from where:  [facility] [phone]  Attempts:  3rd Attempt  Reason for unsuccessful TCM follow-up call:  Unable to leave message Voice mailbox not set up yet  [place] Management [phone]

## 2022-01-27 NOTE — Telephone Encounter (Signed)
Phone call returned to patient. Unable to reach patient by phone.

## 2022-01-28 ENCOUNTER — Other Ambulatory Visit: Payer: Self-pay

## 2022-01-28 DIAGNOSIS — I350 Nonrheumatic aortic (valve) stenosis: Secondary | ICD-10-CM

## 2022-02-01 ENCOUNTER — Telehealth: Payer: Self-pay | Admitting: *Deleted

## 2022-02-01 NOTE — Telephone Encounter (Signed)
   Telephone encounter was:  Successful.  02/01/2022 Name: [name] MRN: [id number] DOB: Mar 01, 1961  [name] is a 61 y.o. year old male who is a primary care patient of [name], MD . The community resource team was consulted for assistance with Food Insecurity and housing  Care guide performed the following interventions: Patient provided with information about care guide support team and interviewed to confirm resource needs. Patient and family is homeless living in 1 bedroom with friend due to black mold in his housing . provided information on food and other resources he has applied all of the palces I offered will follow up with LSW  also [place] [facility] 360 referal Follow Up Plan:  No further follow up planned at this time. The patient has been provided with needed resources.  [place] [phone] [address] , [place] [address] Email : [email or web address]

## 2022-02-02 ENCOUNTER — Other Ambulatory Visit (HOSPITAL_COMMUNITY): Payer: Self-pay

## 2022-02-03 ENCOUNTER — Telehealth: Payer: Self-pay

## 2022-02-03 DIAGNOSIS — R6521 Severe sepsis with septic shock: Secondary | ICD-10-CM | POA: Diagnosis not present

## 2022-02-03 NOTE — Telephone Encounter (Signed)
Pam with [facility] called stating patient no longer has a [name] agency. Patient's [place] agency has attempted to go out to see the patient several times and they have been unsuccessful. They have also tried to reach the patient by phone. Patient stated when he was discharged he would be staying in a hotel due to mold in his house. Patient never showed up to the hotel when the nurse went out to do labs and dressing change. Pam stated they are unsure if the patient is getting the IV antibiotics. Please advise.  Deztinee Lohmeyer T Brooks Sailors

## 2022-02-04 ENCOUNTER — Telehealth: Payer: Self-pay

## 2022-02-04 ENCOUNTER — Telehealth: Payer: Self-pay | Admitting: Pharmacist

## 2022-02-04 NOTE — Telephone Encounter (Signed)
Per Dr. Baxter Flattery, patient unsafe for OPAT d/t living situations and barriers meeting with home health team. She would like to stop patient's daptomycin orders (though he may not have received much at all anyway) and start infusions through short stay.  Gave fax order to [facility] in triage for review prior to faxing. Per Dr. Baxter Flattery, start dalbavancin 1.5g x 1 followed by 1g 10 days after the first dose. Please pull PICC after first dalbavancin infusion.  Let me know if any questions. Thanks!  Alfonse Spruce, PharmD, CPP, BCIDP, [place] Clinical Pharmacist Practitioner [facility]

## 2022-02-04 NOTE — Telephone Encounter (Addendum)
Tried calling patient's significant other, Aquina Spinks. No answer and voicemail box full.   [facility] would be able to see patient. Will update their staff once we are able to get in touch with the patient.   Updated Carolynn Sayers, RN with [facility]. Sent orders to stop daptomycin.   Beryle Flock, RN

## 2022-02-04 NOTE — Telephone Encounter (Signed)
Called pt but was unable to make contact or leave vm   He can schedule with any provider

## 2022-02-04 NOTE — Telephone Encounter (Signed)
Received a call from [facility] Lavella Lemons) that patient no showed for his cpap appointment and they have not been able to contact him. Reached out to the patient at [phone] spoke to the patient who states he has been in the hospital for 17 days and just got out and he was going to call [place] back. Per Lavella Lemons they will hold the order until next week if he does not contact them the order will be voided.

## 2022-02-04 NOTE — Telephone Encounter (Signed)
Tried calling patient on both numbers, call cannot be completed on either number. Will try sending MyChart message to get in touch with Cornelia Copa.   Beryle Flock, RN

## 2022-02-07 NOTE — Telephone Encounter (Signed)
Called patient's significant other, Aquinna Spinks, no answer. Left HIPAA compliant voicemail requesting callback.   Beryle Flock, RN

## 2022-02-08 ENCOUNTER — Ambulatory Visit (HOSPITAL_COMMUNITY)
Admission: RE | Admit: 2022-02-08 | Discharge: 2022-02-08 | Disposition: A | Discharge status: Home / Self Care | Payer: Medicare Other | Source: Ambulatory Visit | Attending: Surgery | Admitting: Surgery

## 2022-02-08 DIAGNOSIS — I350 Nonrheumatic aortic (valve) stenosis: Secondary | ICD-10-CM | POA: Diagnosis not present

## 2022-02-08 MED ORDER — METOPROLOL TARTRATE 5 MG/5ML IV SOLN
2.5000 mg | INTRAVENOUS | Status: AC | PRN
  Administered 2022-02-08: 2.5 mg via INTRAVENOUS

## 2022-02-08 MED ORDER — METOPROLOL TARTRATE 5 MG/5ML IV SOLN
INTRAVENOUS | Status: AC
  Administered 2022-02-08: 2.5 mg via INTRAVENOUS
  Filled 2022-02-08: qty 5

## 2022-02-08 MED ORDER — IOHEXOL 350 MG/ML SOLN
100.0000 mL | Freq: Once | INTRAVENOUS | Status: AC | PRN
  Administered 2022-02-08: 100 mL via INTRAVENOUS

## 2022-02-08 NOTE — Telephone Encounter (Signed)
FYI Cassie

## 2022-02-08 NOTE — Progress Notes (Signed)
Dr. Johnsie Cancel paged to unit.

## 2022-02-08 NOTE — Telephone Encounter (Signed)
I called the patient and spoke with the patient regarding his IV antibiotics. Patient states he has been receiving his IV antibiotics daily and they have been getting delivered to the [facility]. Patient reports he has not had any labs drawn, but his wife has been changing his picc line dressing. Patient stated his phone has been disconnected, but now can be reached on the [phone] number listed on his chart. Patient is scheduled with Dr. Linus Salmons on 02/11/22 to discuss treatment plan and get labs Stasha Naraine T Brooks Sailors

## 2022-02-08 NOTE — Progress Notes (Signed)
Dr. Johnsie Cancel called back and states a-fib/ flutter is not new for this patient and he is on anticoagulation for this condition. New order received for metoprolol with instructions to scan if HR less than 100.

## 2022-02-08 NOTE — Progress Notes (Signed)
Arrived with HR irregular, in and out of a fib/ flutter and ST with PVC's rate 120's. Patient states he was not given instructions/ prescription for medications pre-CT today. Dr. Johnsie Cancel called to notify.

## 2022-02-08 NOTE — Telephone Encounter (Signed)
Thank you :)

## 2022-02-11 ENCOUNTER — Inpatient Hospital Stay: Payer: Medicare Other | Admitting: Internal Medicine

## 2022-02-11 ENCOUNTER — Telehealth: Payer: Self-pay

## 2022-02-11 ENCOUNTER — Other Ambulatory Visit: Payer: Self-pay | Admitting: Internal Medicine

## 2022-02-11 MED ORDER — LINEZOLID 600 MG PO TABS: 600.0000 mg | ORAL_TABLET | Freq: Two times a day (BID) | ORAL | 0 refills | Status: DC

## 2022-02-11 NOTE — Telephone Encounter (Signed)
Per Dr. Luciana Axe stop IV abx today and pull picc. Called [facility] and gave orders to [employer] who stated she will give orders to pharmacist and call back if needed.   Called and talked to patient to let him know that I will call home health to give orders to pull picc and stop IV abx they will contact him to schedule to come and pull picc and that provider sent linezolid to pharmacy today and to start taking it by mouth. Patient is scheduled for 2 weeks on 11/21 with Dr. Luciana Axe.

## 2022-02-14 ENCOUNTER — Inpatient Hospital Stay: Payer: Medicare Other | Admitting: Internal Medicine

## 2022-02-16 ENCOUNTER — Encounter: Payer: Self-pay | Admitting: Internal Medicine

## 2022-02-16 ENCOUNTER — Ambulatory Visit (INDEPENDENT_AMBULATORY_CARE_PROVIDER_SITE_OTHER): Payer: Medicare Other | Admitting: Internal Medicine

## 2022-02-16 ENCOUNTER — Other Ambulatory Visit: Payer: Self-pay

## 2022-02-16 VITALS — BP 169/140 | HR 125 | Temp 97.8°F | Wt 186.0 lb

## 2022-02-16 DIAGNOSIS — M00261 Other streptococcal arthritis, right knee: Secondary | ICD-10-CM

## 2022-02-16 DIAGNOSIS — S81801A Unspecified open wound, right lower leg, initial encounter: Secondary | ICD-10-CM

## 2022-02-16 NOTE — Assessment & Plan Note (Addendum)
Overall his knee is improved and stable.  At this time, will have him stop antibiotics and see how he does after his prolonged course.    Picc line was removed during the visit.  I have personally spent 50 minutes involved in face-to-face and non-face-to-face activities for this patient on the day of the visit. Professional time spent includes the following activities: Preparing to see the patient (review of tests), Obtaining and/or reviewing separately obtained history (admission/discharge record), Performing a medically appropriate examination and/or evaluation , Ordering medications/tests/procedures, referring and communicating with other health care professionals, Documenting clinical information in the EMR, Independently interpreting results (not separately reported), Communicating results to the patient/family/caregiver, Counseling and educating the patient/family/caregiver and Care coordination (not separately reported).

## 2022-02-16 NOTE — Progress Notes (Signed)
PICC Removal   Labs drawn via PICC line per Dr. Luciana Axe. Line flushed with 10 mL normal saline and clamped. Patient tolerated procedure well.   PICC length & location:  right basilic 40 cm Removed per verbal order from: Dr. Luciana Axe  Blood thinners:  Eliquis  5 mg PO and aspirin 81 mg PO Platelet count:  none on file  Site assessment: No dressing present upon assessment. PICC line with 4 cm of exposed catheter. Currently being secured with StatLock and 2 bandaids. Patient's wife states that there was a dressing and CHG patch on it when he arrived for his appointment, but that she removed it herself due to the line "hanging out" and attempted to secure it with bandaids. Provided patient and his wife education on infection risk with PICC lines and need for sterile dressing changes.   Extremity warm and dry with palpable radial pulse. No redness, drainage, or swelling present at insertion site.   Pre-removal vital signs: obtained by CMA BP:  162/114 HR:  121 SpO2:  100% room air  Patient unable to tolerate flat, supine position. No sutures present. Insertion site cleaned with CHG, catheter removed and petroleum dressing applied. Tip intact. Pressure held until hemostasis achieved.    Length of catheter removed:  40 cm   Provided patient with after care instructions and precautions print out (via [employer]). Reviewed this information with patient.   Patient verbalized understanding and agreement, all questions answered. Patient tolerated procedure well and remained in clinic under the care of RN 30 minutes post removal.  Post-observation vital signs:  BP:  169/140 HR:  125 SpO2:  99% RA  Notified Dr. Luciana Axe of increase in diastolic blood pressure. Per Dr. Luciana Axe, patient has aortic stenosis which would explain narrowed pulse pressure. Okay to discharge from clinic per provider. Patient denies symptoms of chest pain, headache, or blurry vision.   Notified Jeri Modena, RN with  [facility] and [facility] pharmacy team of removal.  Sandie Ano, RN

## 2022-02-16 NOTE — Progress Notes (Signed)
   Subjective:    Patient ID: [name], male    DOB: [birth year], 61 y.o.   MRN: [id number]  HPI Json is here for follow up of probable right knee septic arthritis. He was hospitalized in September and again in October with group A Streptococcal bacteremia and right knee swelling treated as gout and returned with worsening right knee pain and sepsis.  He underwent I and D 10/16 and findings consistent with tophaceous gout, though did grow CoNS and transitioned from penicillin to daptomycin through 11/21.  His main complaint has been pain in his leg.  His stiches remain in his knee and his lower right leg is wrapped.     Review of Systems  Constitutional:  Negative for chills and fever.  Gastrointestinal:  Negative for diarrhea and nausea.  Skin:  Negative for rash.       Objective:   Physical Exam Eyes:     General: No scleral icterus. Pulmonary:     Effort: Pulmonary effort is normal.  Musculoskeletal:     Comments: Right knee with some swelling, minimal warmth; incision appears closed, sutures in place.   Right leg under the wrapping examined and there is a stage 1 wound on the lateral side.  Leg swollen.    Neurological:     Mental Status: He is alert.    SH; no tobacco       Assessment & Plan:

## 2022-02-16 NOTE — Assessment & Plan Note (Signed)
He had the same wrapping on the lower leg from his hospitalization.  This was removed during the visit and noted a stage 1 wound underneath.   Will refer him to wound care

## 2022-02-17 ENCOUNTER — Emergency Department (HOSPITAL_COMMUNITY): Payer: Medicare Other

## 2022-02-17 ENCOUNTER — Other Ambulatory Visit: Payer: Self-pay

## 2022-02-17 ENCOUNTER — Inpatient Hospital Stay (HOSPITAL_COMMUNITY)
Admission: EM | Admit: 2022-02-17 | Discharge: 2022-02-20 | DRG: 871 | Disposition: A | Discharge status: Home / Self Care | Payer: Medicare Other | Attending: Family Medicine | Admitting: Family Medicine

## 2022-02-17 ENCOUNTER — Encounter (HOSPITAL_COMMUNITY): Payer: Self-pay | Admitting: Emergency Medicine

## 2022-02-17 DIAGNOSIS — N1831 Chronic kidney disease, stage 3a: Secondary | ICD-10-CM | POA: Diagnosis not present

## 2022-02-17 DIAGNOSIS — Z8673 Personal history of transient ischemic attack (TIA), and cerebral infarction without residual deficits: Secondary | ICD-10-CM | POA: Diagnosis not present

## 2022-02-17 DIAGNOSIS — N179 Acute kidney failure, unspecified: Secondary | ICD-10-CM | POA: Diagnosis not present

## 2022-02-17 DIAGNOSIS — I422 Other hypertrophic cardiomyopathy: Secondary | ICD-10-CM | POA: Diagnosis present

## 2022-02-17 DIAGNOSIS — R112 Nausea with vomiting, unspecified: Secondary | ICD-10-CM | POA: Diagnosis present

## 2022-02-17 DIAGNOSIS — Z8249 Family history of ischemic heart disease and other diseases of the circulatory system: Secondary | ICD-10-CM

## 2022-02-17 DIAGNOSIS — M109 Gout, unspecified: Secondary | ICD-10-CM | POA: Diagnosis present

## 2022-02-17 DIAGNOSIS — I70299 Other atherosclerosis of native arteries of extremities, unspecified extremity: Secondary | ICD-10-CM | POA: Diagnosis present

## 2022-02-17 DIAGNOSIS — I5033 Acute on chronic diastolic (congestive) heart failure: Secondary | ICD-10-CM | POA: Diagnosis not present

## 2022-02-17 DIAGNOSIS — Z7982 Long term (current) use of aspirin: Secondary | ICD-10-CM

## 2022-02-17 DIAGNOSIS — I161 Hypertensive emergency: Secondary | ICD-10-CM | POA: Diagnosis present

## 2022-02-17 DIAGNOSIS — G8929 Other chronic pain: Secondary | ICD-10-CM | POA: Diagnosis present

## 2022-02-17 DIAGNOSIS — R42 Dizziness and giddiness: Secondary | ICD-10-CM | POA: Diagnosis not present

## 2022-02-17 DIAGNOSIS — I739 Peripheral vascular disease, unspecified: Secondary | ICD-10-CM | POA: Diagnosis not present

## 2022-02-17 DIAGNOSIS — J189 Pneumonia, unspecified organism: Secondary | ICD-10-CM | POA: Diagnosis present

## 2022-02-17 DIAGNOSIS — I352 Nonrheumatic aortic (valve) stenosis with insufficiency: Secondary | ICD-10-CM | POA: Diagnosis not present

## 2022-02-17 DIAGNOSIS — N390 Urinary tract infection, site not specified: Secondary | ICD-10-CM | POA: Diagnosis not present

## 2022-02-17 DIAGNOSIS — Z1152 Encounter for screening for COVID-19: Secondary | ICD-10-CM

## 2022-02-17 DIAGNOSIS — R6510 Systemic inflammatory response syndrome (SIRS) of non-infectious origin without acute organ dysfunction: Secondary | ICD-10-CM | POA: Diagnosis present

## 2022-02-17 DIAGNOSIS — E1151 Type 2 diabetes mellitus with diabetic peripheral angiopathy without gangrene: Secondary | ICD-10-CM | POA: Diagnosis present

## 2022-02-17 DIAGNOSIS — I4819 Other persistent atrial fibrillation: Secondary | ICD-10-CM | POA: Diagnosis present

## 2022-02-17 DIAGNOSIS — D509 Iron deficiency anemia, unspecified: Secondary | ICD-10-CM | POA: Diagnosis not present

## 2022-02-17 DIAGNOSIS — E1122 Type 2 diabetes mellitus with diabetic chronic kidney disease: Secondary | ICD-10-CM | POA: Diagnosis present

## 2022-02-17 DIAGNOSIS — R9389 Abnormal findings on diagnostic imaging of other specified body structures: Secondary | ICD-10-CM | POA: Diagnosis present

## 2022-02-17 DIAGNOSIS — Z825 Family history of asthma and other chronic lower respiratory diseases: Secondary | ICD-10-CM

## 2022-02-17 DIAGNOSIS — I1A0 Resistant hypertension: Secondary | ICD-10-CM | POA: Diagnosis not present

## 2022-02-17 DIAGNOSIS — N1832 Chronic kidney disease, stage 3b: Secondary | ICD-10-CM | POA: Diagnosis not present

## 2022-02-17 DIAGNOSIS — Z91048 Other nonmedicinal substance allergy status: Secondary | ICD-10-CM

## 2022-02-17 DIAGNOSIS — K219 Gastro-esophageal reflux disease without esophagitis: Secondary | ICD-10-CM | POA: Diagnosis present

## 2022-02-17 DIAGNOSIS — E7800 Pure hypercholesterolemia, unspecified: Secondary | ICD-10-CM | POA: Diagnosis present

## 2022-02-17 DIAGNOSIS — Z841 Family history of disorders of kidney and ureter: Secondary | ICD-10-CM

## 2022-02-17 DIAGNOSIS — I130 Hypertensive heart and chronic kidney disease with heart failure and stage 1 through stage 4 chronic kidney disease, or unspecified chronic kidney disease: Secondary | ICD-10-CM | POA: Diagnosis present

## 2022-02-17 DIAGNOSIS — Z7901 Long term (current) use of anticoagulants: Secondary | ICD-10-CM

## 2022-02-17 DIAGNOSIS — K551 Chronic vascular disorders of intestine: Secondary | ICD-10-CM | POA: Diagnosis not present

## 2022-02-17 DIAGNOSIS — I160 Hypertensive urgency: Secondary | ICD-10-CM | POA: Diagnosis present

## 2022-02-17 DIAGNOSIS — I6523 Occlusion and stenosis of bilateral carotid arteries: Secondary | ICD-10-CM | POA: Diagnosis not present

## 2022-02-17 DIAGNOSIS — L03115 Cellulitis of right lower limb: Secondary | ICD-10-CM | POA: Diagnosis not present

## 2022-02-17 DIAGNOSIS — G4733 Obstructive sleep apnea (adult) (pediatric): Secondary | ICD-10-CM | POA: Diagnosis present

## 2022-02-17 DIAGNOSIS — I2510 Atherosclerotic heart disease of native coronary artery without angina pectoris: Secondary | ICD-10-CM | POA: Diagnosis present

## 2022-02-17 DIAGNOSIS — N3000 Acute cystitis without hematuria: Secondary | ICD-10-CM | POA: Diagnosis not present

## 2022-02-17 DIAGNOSIS — R059 Cough, unspecified: Secondary | ICD-10-CM | POA: Diagnosis not present

## 2022-02-17 DIAGNOSIS — N183 Chronic kidney disease, stage 3 unspecified: Secondary | ICD-10-CM | POA: Diagnosis present

## 2022-02-17 DIAGNOSIS — Z79899 Other long term (current) drug therapy: Secondary | ICD-10-CM | POA: Diagnosis not present

## 2022-02-17 DIAGNOSIS — I350 Nonrheumatic aortic (valve) stenosis: Secondary | ICD-10-CM | POA: Diagnosis not present

## 2022-02-17 DIAGNOSIS — I4891 Unspecified atrial fibrillation: Secondary | ICD-10-CM | POA: Diagnosis not present

## 2022-02-17 DIAGNOSIS — A419 Sepsis, unspecified organism: Principal | ICD-10-CM | POA: Diagnosis present

## 2022-02-17 DIAGNOSIS — I5023 Acute on chronic systolic (congestive) heart failure: Secondary | ICD-10-CM | POA: Diagnosis present

## 2022-02-17 DIAGNOSIS — M549 Dorsalgia, unspecified: Secondary | ICD-10-CM | POA: Diagnosis present

## 2022-02-17 DIAGNOSIS — Z91040 Latex allergy status: Secondary | ICD-10-CM

## 2022-02-17 DIAGNOSIS — Z8739 Personal history of other diseases of the musculoskeletal system and connective tissue: Secondary | ICD-10-CM

## 2022-02-17 LAB — DIFFERENTIAL
Abs Immature Granulocytes: 0.03 10*3/uL (ref 0.00–0.07)
Basophils Absolute: 0 10*3/uL (ref 0.0–0.1)
Basophils Relative: 1 %
Eosinophils Absolute: 0 10*3/uL (ref 0.0–0.5)
Eosinophils Relative: 1 %
Immature Granulocytes: 1 %
Lymphocytes Relative: 41 %
Lymphs Abs: 1.4 10*3/uL (ref 0.7–4.0)
Monocytes Absolute: 0.4 10*3/uL (ref 0.1–1.0)
Monocytes Relative: 11 %
Neutro Abs: 1.5 10*3/uL — ABNORMAL LOW (ref 1.7–7.7)
Neutrophils Relative %: 45 %

## 2022-02-17 LAB — COMPREHENSIVE METABOLIC PANEL
ALT: 33 U/L (ref 0–44)
AST: 27 U/L (ref 15–41)
Albumin: 2.9 g/dL — ABNORMAL LOW (ref 3.5–5.0)
Alkaline Phosphatase: 86 U/L (ref 38–126)
Anion gap: 13 (ref 5–15)
BUN: 25 mg/dL — ABNORMAL HIGH (ref 8–23)
CO2: 17 mmol/L — ABNORMAL LOW (ref 22–32)
Calcium: 9.6 mg/dL (ref 8.9–10.3)
Chloride: 109 mmol/L (ref 98–111)
Creatinine, Ser: 1.96 mg/dL — ABNORMAL HIGH (ref 0.61–1.24)
GFR, Estimated: 38 mL/min — ABNORMAL LOW (ref >60)
Glucose, Bld: 96 mg/dL (ref 70–99)
Potassium: 5 mmol/L (ref 3.5–5.1)
Sodium: 139 mmol/L (ref 135–145)
Total Bilirubin: 0.4 mg/dL (ref 0.3–1.2)
Total Protein: 7.2 g/dL (ref 6.5–8.1)

## 2022-02-17 LAB — ETHANOL: Alcohol, Ethyl (B): <10 mg/dL (ref <10)

## 2022-02-17 LAB — COMPLETE METABOLIC PANEL WITH GFR
AG Ratio: 1 (calc) (ref 1.0–2.5)
ALT: 39 U/L (ref 9–46)
AST: 27 U/L (ref 10–35)
Albumin: 3.4 g/dL — ABNORMAL LOW (ref 3.6–5.1)
Alkaline phosphatase (APISO): 99 U/L (ref 35–144)
BUN/Creatinine Ratio: 13 (calc) (ref 6–22)
BUN: 22 mg/dL (ref 7–25)
CO2: 22 mmol/L (ref 20–32)
Calcium: 9.1 mg/dL (ref 8.6–10.3)
Chloride: 109 mmol/L (ref 98–110)
Creat: 1.75 mg/dL — ABNORMAL HIGH (ref 0.70–1.35)
Globulin: 3.5 g/dL (calc) (ref 1.9–3.7)
Glucose, Bld: 99 mg/dL (ref 65–99)
Potassium: 4.6 mmol/L (ref 3.5–5.3)
Sodium: 139 mmol/L (ref 135–146)
Total Bilirubin: 0.4 mg/dL (ref 0.2–1.2)
Total Protein: 6.9 g/dL (ref 6.1–8.1)
eGFR: 43 mL/min/{1.73_m2} — ABNORMAL LOW (ref >=60)

## 2022-02-17 LAB — CBC WITH DIFFERENTIAL/PLATELET
Absolute Monocytes: 322 cells/uL (ref 200–950)
Basophils Absolute: 30 cells/uL (ref 0–200)
Basophils Relative: 0.8 %
Eosinophils Absolute: 70 cells/uL (ref 15–500)
Eosinophils Relative: 1.9 %
HCT: 32 % — ABNORMAL LOW (ref 38.5–50.0)
Hemoglobin: 9.4 g/dL — ABNORMAL LOW (ref 13.2–17.1)
Lymphs Abs: 1328 cells/uL (ref 850–3900)
MCH: 23.9 pg — ABNORMAL LOW (ref 27.0–33.0)
MCHC: 29.4 g/dL — ABNORMAL LOW (ref 32.0–36.0)
MCV: 81.4 fL (ref 80.0–100.0)
MPV: 11.2 fL (ref 7.5–12.5)
Monocytes Relative: 8.7 %
Neutro Abs: 1950 cells/uL (ref 1500–7800)
Neutrophils Relative %: 52.7 %
Platelets: 196 10*3/uL (ref 140–400)
RBC: 3.93 10*6/uL — ABNORMAL LOW (ref 4.20–5.80)
RDW: 18.3 % — ABNORMAL HIGH (ref 11.0–15.0)
Total Lymphocyte: 35.9 %
WBC: 3.7 10*3/uL — ABNORMAL LOW (ref 3.8–10.8)

## 2022-02-17 LAB — CBC
HCT: 32.2 % — ABNORMAL LOW (ref 39.0–52.0)
Hemoglobin: 10.1 g/dL — ABNORMAL LOW (ref 13.0–17.0)
MCH: 23.8 pg — ABNORMAL LOW (ref 26.0–34.0)
MCHC: 31.4 g/dL (ref 30.0–36.0)
MCV: 75.9 fL — ABNORMAL LOW (ref 80.0–100.0)
Platelets: 170 10*3/uL (ref 150–400)
RBC: 4.24 MIL/uL (ref 4.22–5.81)
RDW: 19.6 % — ABNORMAL HIGH (ref 11.5–15.5)
WBC: 3.3 10*3/uL — ABNORMAL LOW (ref 4.0–10.5)
nRBC: 1.2 % — ABNORMAL HIGH (ref 0.0–0.2)

## 2022-02-17 LAB — PROTIME-INR
INR: 1.9 — ABNORMAL HIGH (ref 0.8–1.2)
Prothrombin Time: 21.7 seconds — ABNORMAL HIGH (ref 11.4–15.2)

## 2022-02-17 LAB — C-REACTIVE PROTEIN: CRP: 14.9 mg/L — ABNORMAL HIGH (ref <8.0)

## 2022-02-17 LAB — APTT: aPTT: 45 seconds — ABNORMAL HIGH (ref 24–36)

## 2022-02-17 LAB — CBG MONITORING, ED: Glucose-Capillary: 94 mg/dL (ref 70–99)

## 2022-02-17 LAB — SEDIMENTATION RATE: Sed Rate: 9 mm/h (ref 0–20)

## 2022-02-17 MED ORDER — CLONIDINE HCL 0.1 MG PO TABS
0.1000 mg | ORAL_TABLET | Freq: Once | ORAL | Status: AC
  Administered 2022-02-17: 0.1 mg via ORAL
  Filled 2022-02-17: qty 1

## 2022-02-17 MED ORDER — SODIUM CHLORIDE 0.9% FLUSH: 3.0000 mL | Freq: Once | INTRAVENOUS | Status: DC

## 2022-02-17 MED ORDER — ONDANSETRON HCL 4 MG/2ML IJ SOLN
4.0000 mg | Freq: Once | INTRAMUSCULAR | Status: AC
  Administered 2022-02-17: 4 mg via INTRAVENOUS
  Filled 2022-02-17: qty 2

## 2022-02-17 MED ORDER — SODIUM CHLORIDE 0.9 % IV BOLUS
1000.0000 mL | Freq: Once | INTRAVENOUS | Status: AC
  Administered 2022-02-17: 1000 mL via INTRAVENOUS

## 2022-02-17 NOTE — ED Provider Triage Note (Signed)
Emergency Medicine Provider Triage Evaluation Note  Johnpaul Gillentine , a 61 y.o. male  was evaluated in triage.  Pt complains of dizziness characterizes spinning room sensation.  He also endorses diarrhea and nausea.  This started yesterday.  He also endorses generalized weakness.  Review of Systems  Positive:  Negative: See above   Physical Exam  BP (!) 185/146 (BP Location: Left Arm)   Pulse (!) 116   Temp 98.5 F (36.9 C) (Oral)   Resp 16   SpO2 96%  Gen:   Awake, no distress   Resp:  Normal effort  MSK:   Moves extremities without difficulty  Other:    Medical Decision Making  Medically screening exam initiated at 6:54 PM.  Appropriate orders placed.  [name] was informed that the remainder of the evaluation will be completed by another provider, this initial triage assessment does not replace that evaluation, and the importance of remaining in the ED until their evaluation is complete.  Neuro exam is reassuring.  Patient outside stroke window.  Will evaluate with CT head and chest x-Foos in addition to lab work.   Honor Loh [place], [place] 02/17/22 [id number]

## 2022-02-17 NOTE — ED Triage Notes (Signed)
Patient arrives by POV in wheelchair c/o everytime he takes a step feeling nauseas and only feels better when closing his eyes. Patients BP elevated in triage- pt states taking meds as prescribed. Patient had PICC line removed yesterday.

## 2022-02-17 NOTE — ED Notes (Signed)
ED provider at bedside.

## 2022-02-17 NOTE — ED Notes (Signed)
Pt transported to MRI At this time.

## 2022-02-17 NOTE — ED Notes (Signed)
Pt in ct at this time ° °

## 2022-02-18 ENCOUNTER — Emergency Department (HOSPITAL_COMMUNITY): Payer: Medicare Other

## 2022-02-18 ENCOUNTER — Encounter (HOSPITAL_COMMUNITY): Payer: Self-pay | Admitting: Internal Medicine

## 2022-02-18 DIAGNOSIS — I1A0 Resistant hypertension: Secondary | ICD-10-CM | POA: Diagnosis present

## 2022-02-18 DIAGNOSIS — R42 Dizziness and giddiness: Secondary | ICD-10-CM | POA: Diagnosis not present

## 2022-02-18 DIAGNOSIS — N3000 Acute cystitis without hematuria: Secondary | ICD-10-CM | POA: Diagnosis not present

## 2022-02-18 DIAGNOSIS — I4819 Other persistent atrial fibrillation: Secondary | ICD-10-CM | POA: Diagnosis present

## 2022-02-18 DIAGNOSIS — I739 Peripheral vascular disease, unspecified: Secondary | ICD-10-CM

## 2022-02-18 DIAGNOSIS — D509 Iron deficiency anemia, unspecified: Secondary | ICD-10-CM

## 2022-02-18 DIAGNOSIS — Z8673 Personal history of transient ischemic attack (TIA), and cerebral infarction without residual deficits: Secondary | ICD-10-CM | POA: Diagnosis not present

## 2022-02-18 DIAGNOSIS — E1151 Type 2 diabetes mellitus with diabetic peripheral angiopathy without gangrene: Secondary | ICD-10-CM | POA: Diagnosis present

## 2022-02-18 DIAGNOSIS — I6523 Occlusion and stenosis of bilateral carotid arteries: Secondary | ICD-10-CM | POA: Diagnosis not present

## 2022-02-18 DIAGNOSIS — I5033 Acute on chronic diastolic (congestive) heart failure: Secondary | ICD-10-CM | POA: Diagnosis not present

## 2022-02-18 DIAGNOSIS — I350 Nonrheumatic aortic (valve) stenosis: Secondary | ICD-10-CM | POA: Diagnosis not present

## 2022-02-18 DIAGNOSIS — I161 Hypertensive emergency: Secondary | ICD-10-CM | POA: Diagnosis not present

## 2022-02-18 DIAGNOSIS — J189 Pneumonia, unspecified organism: Secondary | ICD-10-CM | POA: Diagnosis present

## 2022-02-18 DIAGNOSIS — I2510 Atherosclerotic heart disease of native coronary artery without angina pectoris: Secondary | ICD-10-CM | POA: Diagnosis not present

## 2022-02-18 DIAGNOSIS — R6510 Systemic inflammatory response syndrome (SIRS) of non-infectious origin without acute organ dysfunction: Secondary | ICD-10-CM

## 2022-02-18 DIAGNOSIS — N179 Acute kidney failure, unspecified: Secondary | ICD-10-CM

## 2022-02-18 DIAGNOSIS — N1832 Chronic kidney disease, stage 3b: Secondary | ICD-10-CM | POA: Diagnosis present

## 2022-02-18 DIAGNOSIS — R9389 Abnormal findings on diagnostic imaging of other specified body structures: Secondary | ICD-10-CM | POA: Diagnosis not present

## 2022-02-18 DIAGNOSIS — K551 Chronic vascular disorders of intestine: Secondary | ICD-10-CM | POA: Diagnosis present

## 2022-02-18 DIAGNOSIS — Z825 Family history of asthma and other chronic lower respiratory diseases: Secondary | ICD-10-CM | POA: Diagnosis not present

## 2022-02-18 DIAGNOSIS — Z79899 Other long term (current) drug therapy: Secondary | ICD-10-CM | POA: Diagnosis not present

## 2022-02-18 DIAGNOSIS — E1122 Type 2 diabetes mellitus with diabetic chronic kidney disease: Secondary | ICD-10-CM | POA: Diagnosis present

## 2022-02-18 DIAGNOSIS — N1831 Chronic kidney disease, stage 3a: Secondary | ICD-10-CM | POA: Diagnosis not present

## 2022-02-18 DIAGNOSIS — Z8739 Personal history of other diseases of the musculoskeletal system and connective tissue: Secondary | ICD-10-CM

## 2022-02-18 DIAGNOSIS — Z7901 Long term (current) use of anticoagulants: Secondary | ICD-10-CM | POA: Diagnosis not present

## 2022-02-18 DIAGNOSIS — I352 Nonrheumatic aortic (valve) stenosis with insufficiency: Secondary | ICD-10-CM | POA: Diagnosis present

## 2022-02-18 DIAGNOSIS — L03115 Cellulitis of right lower limb: Secondary | ICD-10-CM | POA: Diagnosis present

## 2022-02-18 DIAGNOSIS — I160 Hypertensive urgency: Secondary | ICD-10-CM

## 2022-02-18 DIAGNOSIS — R112 Nausea with vomiting, unspecified: Secondary | ICD-10-CM

## 2022-02-18 DIAGNOSIS — Z8249 Family history of ischemic heart disease and other diseases of the circulatory system: Secondary | ICD-10-CM | POA: Diagnosis not present

## 2022-02-18 DIAGNOSIS — Z1152 Encounter for screening for COVID-19: Secondary | ICD-10-CM | POA: Diagnosis not present

## 2022-02-18 DIAGNOSIS — I422 Other hypertrophic cardiomyopathy: Secondary | ICD-10-CM | POA: Diagnosis present

## 2022-02-18 DIAGNOSIS — I4891 Unspecified atrial fibrillation: Secondary | ICD-10-CM | POA: Diagnosis not present

## 2022-02-18 DIAGNOSIS — A419 Sepsis, unspecified organism: Secondary | ICD-10-CM | POA: Diagnosis present

## 2022-02-18 DIAGNOSIS — N390 Urinary tract infection, site not specified: Secondary | ICD-10-CM | POA: Diagnosis present

## 2022-02-18 DIAGNOSIS — I130 Hypertensive heart and chronic kidney disease with heart failure and stage 1 through stage 4 chronic kidney disease, or unspecified chronic kidney disease: Secondary | ICD-10-CM | POA: Diagnosis present

## 2022-02-18 DIAGNOSIS — E7800 Pure hypercholesterolemia, unspecified: Secondary | ICD-10-CM | POA: Diagnosis present

## 2022-02-18 LAB — BASIC METABOLIC PANEL
Anion gap: 9 (ref 5–15)
BUN: 24 mg/dL — ABNORMAL HIGH (ref 8–23)
CO2: 18 mmol/L — ABNORMAL LOW (ref 22–32)
Calcium: 9 mg/dL (ref 8.9–10.3)
Chloride: 111 mmol/L (ref 98–111)
Creatinine, Ser: 1.65 mg/dL — ABNORMAL HIGH (ref 0.61–1.24)
GFR, Estimated: 47 mL/min — ABNORMAL LOW (ref >60)
Glucose, Bld: 89 mg/dL (ref 70–99)
Potassium: 4.9 mmol/L (ref 3.5–5.1)
Sodium: 138 mmol/L (ref 135–145)

## 2022-02-18 LAB — URINALYSIS, ROUTINE W REFLEX MICROSCOPIC
Bilirubin Urine: NEGATIVE
Glucose, UA: 150 mg/dL — AB
Ketones, ur: NEGATIVE mg/dL
Leukocytes,Ua: NEGATIVE
Nitrite: NEGATIVE
Protein, ur: >=300 mg/dL — AB
Specific Gravity, Urine: 1.029 (ref 1.005–1.030)
pH: 5 (ref 5.0–8.0)

## 2022-02-18 LAB — SEDIMENTATION RATE: Sed Rate: 18 mm/hr — ABNORMAL HIGH (ref 0–16)

## 2022-02-18 LAB — PROCALCITONIN: Procalcitonin: 0.17 ng/mL

## 2022-02-18 LAB — RESP PANEL BY RT-PCR (FLU A&B, COVID) ARPGX2
Influenza A by PCR: NEGATIVE
Influenza B by PCR: NEGATIVE
SARS Coronavirus 2 by RT PCR: NEGATIVE

## 2022-02-18 LAB — BRAIN NATRIURETIC PEPTIDE: B Natriuretic Peptide: 641.3 pg/mL — ABNORMAL HIGH (ref 0.0–100.0)

## 2022-02-18 LAB — C-REACTIVE PROTEIN: CRP: 0.7 mg/dL (ref <1.0)

## 2022-02-18 LAB — LACTIC ACID, PLASMA
Lactic Acid, Venous: 1.9 mmol/L (ref 0.5–1.9)
Lactic Acid, Venous: 2 mmol/L (ref 0.5–1.9)
Lactic Acid, Venous: 1.8 mmol/L (ref 0.5–1.9)

## 2022-02-18 LAB — RAPID URINE DRUG SCREEN, HOSP PERFORMED
Amphetamines: NOT DETECTED
Barbiturates: NOT DETECTED
Benzodiazepines: NOT DETECTED
Cocaine: NOT DETECTED
Opiates: NOT DETECTED
Tetrahydrocannabinol: POSITIVE — AB

## 2022-02-18 LAB — TSH: TSH: 1.305 u[IU]/mL (ref 0.350–4.500)

## 2022-02-18 LAB — VITAMIN B12: Vitamin B-12: 376 pg/mL (ref 180–914)

## 2022-02-18 LAB — MAGNESIUM: Magnesium: 1.9 mg/dL (ref 1.7–2.4)

## 2022-02-18 MED ORDER — ACETAMINOPHEN 325 MG PO TABS: 650.0000 mg | ORAL_TABLET | Freq: Four times a day (QID) | ORAL | Status: DC | PRN

## 2022-02-18 MED ORDER — HYDRALAZINE HCL 50 MG PO TABS
50.0000 mg | ORAL_TABLET | Freq: Three times a day (TID) | ORAL | Status: DC
  Administered 2022-02-18 – 2022-02-20 (×6): 50 mg via ORAL
  Filled 2022-02-18: qty 2
  Filled 2022-02-18 (×5): qty 1

## 2022-02-18 MED ORDER — ACETAMINOPHEN 650 MG RE SUPP: 650.0000 mg | Freq: Four times a day (QID) | RECTAL | Status: DC | PRN

## 2022-02-18 MED ORDER — SODIUM CHLORIDE 0.9 % IV SOLN
2.0000 g | INTRAVENOUS | Status: DC
  Administered 2022-02-18 – 2022-02-19 (×2): 2 g via INTRAVENOUS
  Filled 2022-02-18 (×2): qty 20

## 2022-02-18 MED ORDER — DILTIAZEM LOAD VIA INFUSION
10.0000 mg | Freq: Once | INTRAVENOUS | Status: AC
  Administered 2022-02-18: 10 mg via INTRAVENOUS
  Filled 2022-02-18: qty 10

## 2022-02-18 MED ORDER — CARVEDILOL 12.5 MG PO TABS: 12.5000 mg | ORAL_TABLET | Freq: Two times a day (BID) | ORAL | Status: DC

## 2022-02-18 MED ORDER — IOHEXOL 350 MG/ML SOLN
75.0000 mL | Freq: Once | INTRAVENOUS | Status: AC | PRN
  Administered 2022-02-18: 75 mL via INTRAVENOUS

## 2022-02-18 MED ORDER — ALBUTEROL SULFATE (2.5 MG/3ML) 0.083% IN NEBU: 2.5000 mg | INHALATION_SOLUTION | Freq: Four times a day (QID) | RESPIRATORY_TRACT | Status: DC | PRN

## 2022-02-18 MED ORDER — ALLOPURINOL 100 MG PO TABS
100.0000 mg | ORAL_TABLET | Freq: Every day | ORAL | Status: DC
  Administered 2022-02-18 – 2022-02-20 (×3): 100 mg via ORAL
  Filled 2022-02-18 (×3): qty 1

## 2022-02-18 MED ORDER — SODIUM CHLORIDE 0.9% FLUSH
3.0000 mL | Freq: Two times a day (BID) | INTRAVENOUS | Status: DC
  Administered 2022-02-18 – 2022-02-20 (×4): 3 mL via INTRAVENOUS

## 2022-02-18 MED ORDER — SODIUM CHLORIDE 0.9 % IV SOLN: 2.0000 g | INTRAVENOUS | Status: DC

## 2022-02-18 MED ORDER — ASPIRIN 81 MG PO TBEC
81.0000 mg | DELAYED_RELEASE_TABLET | Freq: Every day | ORAL | Status: DC
  Administered 2022-02-18 – 2022-02-20 (×3): 81 mg via ORAL
  Filled 2022-02-18 (×3): qty 1

## 2022-02-18 MED ORDER — CARVEDILOL 25 MG PO TABS
25.0000 mg | ORAL_TABLET | Freq: Two times a day (BID) | ORAL | Status: DC
  Administered 2022-02-18 – 2022-02-20 (×4): 25 mg via ORAL
  Filled 2022-02-18 (×4): qty 1

## 2022-02-18 MED ORDER — DILTIAZEM HCL-DEXTROSE 125-5 MG/125ML-% IV SOLN (PREMIX)
5.0000 mg/h | INTRAVENOUS | Status: DC
  Administered 2022-02-18: 12.5 mg/h via INTRAVENOUS
  Administered 2022-02-18: 5 mg/h via INTRAVENOUS
  Administered 2022-02-19: 12.5 mg/h via INTRAVENOUS
  Filled 2022-02-18 (×3): qty 125

## 2022-02-18 MED ORDER — PANTOPRAZOLE SODIUM 40 MG IV SOLR
40.0000 mg | INTRAVENOUS | Status: DC
  Administered 2022-02-18 – 2022-02-19 (×2): 40 mg via INTRAVENOUS
  Filled 2022-02-18 (×2): qty 10

## 2022-02-18 MED ORDER — SODIUM CHLORIDE 0.9 % IV SOLN: 1.0000 g | INTRAVENOUS | Status: DC

## 2022-02-18 MED ORDER — CLONIDINE HCL 0.1 MG PO TABS
0.1000 mg | ORAL_TABLET | Freq: Three times a day (TID) | ORAL | Status: DC
  Administered 2022-02-18 – 2022-02-20 (×6): 0.1 mg via ORAL
  Filled 2022-02-18 (×6): qty 1

## 2022-02-18 MED ORDER — FERROUS SULFATE 325 (65 FE) MG PO TABS
325.0000 mg | ORAL_TABLET | Freq: Every day | ORAL | Status: DC
  Administered 2022-02-18 – 2022-02-20 (×3): 325 mg via ORAL
  Filled 2022-02-18 (×3): qty 1

## 2022-02-18 MED ORDER — HYDRALAZINE HCL 20 MG/ML IJ SOLN
10.0000 mg | INTRAMUSCULAR | Status: DC | PRN
  Filled 2022-02-18: qty 1

## 2022-02-18 MED ORDER — GABAPENTIN 400 MG PO CAPS
800.0000 mg | ORAL_CAPSULE | Freq: Three times a day (TID) | ORAL | Status: DC
  Administered 2022-02-18 – 2022-02-20 (×6): 800 mg via ORAL
  Filled 2022-02-18 (×6): qty 2

## 2022-02-18 MED ORDER — APIXABAN 5 MG PO TABS
5.0000 mg | ORAL_TABLET | Freq: Two times a day (BID) | ORAL | Status: DC
  Administered 2022-02-18 – 2022-02-20 (×5): 5 mg via ORAL
  Filled 2022-02-18 (×5): qty 1

## 2022-02-18 NOTE — ED Notes (Signed)
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 administration in time range)  albuterol (PROVENTIL) (2.5 MG/3ML) 0.083% nebulizer solution 2.5 mg (has no administration in time range)  hydrALAZINE (APRESOLINE) injection 10 mg (has no administration in time range)  aspirin EC tablet 81 mg (81 mg Oral Given 02/18/22 1301)  allopurinol (ZYLOPRIM) tablet 100 mg (100 mg Oral Given 02/18/22 1301)  cloNIDine (CATAPRES) tablet 0.1 mg (0.1 mg Oral Not Given 02/18/22 1632)  hydrALAZINE (APRESOLINE) tablet 50 mg (50 mg  Oral Given 02/18/22 1429)  gabapentin (NEURONTIN) capsule 800 mg (800 mg Oral Not Given 02/18/22 1631)  ferrous sulfate tablet 325 mg (325 mg Oral Given 02/18/22 1301)  pantoprazole (PROTONIX) injection 40 mg (40 mg Intravenous Given 02/18/22 1302)  sodium chloride 0.9 % bolus 1,000 mL (0 mLs Intravenous Stopped 02/18/22 0142)  cloNIDine (CATAPRES) tablet 0.1 mg (0.1 mg Oral Given 02/17/22 2348)  ondansetron (ZOFRAN) injection 4 mg (4 mg Intravenous Given 02/17/22 2348)  iohexol (OMNIPAQUE) 350 MG/ML injection 75 mL (75 mLs Intravenous Contrast Given 02/18/22 0034)    Mobility Rides POV wheelchair.  Moderate fall risk   Focused Assessments    R Recommendations: See Admitting Provider Note  Report given to: Dillard Cannon   Additional Notes:

## 2022-02-18 NOTE — Progress Notes (Signed)
Patient Lactic acid is 2.0. Mansy MD  was notified.

## 2022-02-18 NOTE — ED Provider Notes (Signed)
[facility] DEPART[facility] Provider Note   CSN: [id number] Arrival date & time: 02/17/22  1730     History  Chief Complaint  Patient presents with   Nausea    Josede Warshauer is a 61 y.o. male.  HPI   Medical history including hypertension, hyperlipidemia, chronic diarrhea systolic heart failure, OSA on CPAP, atrial fib gout, aortic stenosis, status post septic arthritis right knee presents with complaints of dizziness and vomiting.  Patient states starting Tuesday he has been having dizziness, describes as if being off balance, this makes him nauseous and makes him vomit, he states that this is mainly positional when he goes from a sitting to standing position or when he moves his head, no associated headaches no change in vision no paresthesias or weakness in the upper or lower extremities, no recent head trauma, denies any neck pain no fevers no chills patient never had this in the past, he endorses that he has been compliant with all of his medications, but states he has not been able to eat very much due to nausea again this is mainly from the dizziness.  No cough no congestion no chest pain no shortness of breath no stomach pain, still passing gas having normal bowel movements.  Reviewed patient's chart recently had PICC line which was removed last week on the 15th, was receiving IV antibiotics due to septic arthritis of the right knee, patient had difficult controlled blood pressure, also was noted to be narrowed due to the aortic stenosis.  Home Medications Prior to Admission medications   Medication Sig Start Date End Date Taking? Authorizing Provider  DAPTOmycin (CUBICIN) 500 MG injection Inject into the vein. 02/03/22  Yes [provider]  linezolid (ZYVOX) 600 MG tablet Take 1 tablet (600 mg total) by mouth 2 (two) times daily. 02/11/22   Thayer Headings, MD  spironolactone (ALDACTONE) 50 MG tablet Take 50 mg by mouth daily. 02/13/22  Yes [provider]  acetaminophen (TYLENOL) 500 MG tablet Take 500-1,000 mg by mouth every 6 (six) hours as needed for moderate pain.    [provider]  allopurinol (ZYLOPRIM) 100 MG tablet Take 1 tablet (100 mg total) by mouth daily. 01/26/22 03/27/22  Elodia Florence., MD  apixaban (ELIQUIS) 5 MG TABS tablet Take 1 tablet (5 mg total) by mouth 2 (two) times daily. 11/08/21   [name], MD  aspirin EC 81 MG tablet Take 81 mg by mouth daily. Swallow whole.    [provider]  atorvastatin (LIPITOR) 20 MG tablet Take 1 tablet (20 mg total) by mouth daily. Resume this after your antibiotics are complete 02/18/22   Elodia Florence., MD  carvedilol (COREG) 12.5 MG tablet Take 1 tablet (12.5 mg total) by mouth 2 (two) times daily with a meal. 01/25/22 03/26/22  Elodia Florence., MD  cloNIDine (CATAPRES) 0.1 MG tablet Take 1 tablet (0.1 mg total) by mouth 3 (three) times daily. 01/25/22 03/26/22  Elodia Florence., MD  FARXIGA 10 MG TABS tablet Take 10 mg by mouth daily. 01/08/22   [provider]  ferrous sulfate 325 (65 FE) MG EC tablet Take 1 tablet (325 mg total) by mouth every other day. Patient taking differently: Take 325 mg by mouth daily with breakfast. 01/06/22 01/06/23  Danford, Suann Larry, MD  gabapentin (NEURONTIN) 400 MG capsule TAKE 2 CAPSULES(800 MG) BY MOUTH THREE TIMES DAILY Patient taking differently: Take 800 mg by mouth 3 (three)  times daily. 11/08/21   [name], MD  hydrALAZINE (APRESOLINE) 25 MG tablet Take 1 tablet (25 mg total) by mouth every 8 (eight) hours. 01/25/22 03/26/22  Elodia Florence., MD  magnesium oxide (MAG-OX) 400 MG tablet Take 1 tablet (400 mg total) by mouth every other day. 11/09/21   Donato Heinz, MD  olmesartan (BENICAR) 40 MG tablet Take 40 mg by mouth daily.    [provider]  omeprazole (PRILOSEC) 40 MG capsule Take 1 capsule (40 mg total) by mouth daily. 12/07/21   Donato Heinz, MD  potassium chloride SA (KLOR-CON M) 20 MEQ tablet Take 1 tablet (20 mEq total) by mouth daily as needed. When you take your lasix 01/25/22   Elodia Florence., MD      Allergies    Adhesive [tape] and Latex    Review of Systems   Review of Systems  Constitutional:  Negative for chills and fever.  Respiratory:  Negative for shortness of breath.   Cardiovascular:  Negative for chest pain.  Gastrointestinal:  Positive for nausea. Negative for abdominal pain.  Neurological:  Positive for dizziness. Negative for headaches.    Physical Exam Updated Vital Signs BP (!) 137/95   Pulse 93   Temp 97.9 F (36.6 C) (Oral)   Resp 18   Ht 5\' 9"  (1.753 m)   Wt 84.4 kg   SpO2 96%   BMI 27.47 kg/m  Physical Exam Vitals and nursing note reviewed.  Constitutional:      General: He is not in acute distress.    Appearance: He is not ill-appearing.  HENT:     Head: Normocephalic and atraumatic.     Nose: No congestion.  Eyes:     Conjunctiva/sclera: Conjunctivae normal.  Cardiovascular:     Rate and Rhythm: Tachycardia present. Rhythm irregular.     Pulses: Normal pulses.     Heart sounds: No murmur heard.    No friction rub. No gallop.  Pulmonary:     Effort: No respiratory distress.     Breath sounds: No wheezing, rhonchi or rales.  Abdominal:     Palpations: Abdomen is soft.     Tenderness: There is no abdominal tenderness. There is no right CVA tenderness or left CVA tenderness.  Musculoskeletal:     Right lower leg: No edema.     Left lower leg: No edema.  Skin:    General: Skin is warm and dry.     Comments: Patient has chronic venous stasis wounds on his right lower leg, no evidence of infection present during my exam.  He has surgical scars along the anterior aspect of the right patella from recent debridement no evidence of infection present.   Neurological:     Mental Status: He is alert.     GCS: GCS eye subscore is 4. GCS verbal subscore is 5. GCS  motor subscore is 6.     Cranial Nerves: Cranial nerves 2-12 are intact.     Motor: No weakness.     Coordination: Romberg sign negative. Finger-Nose-Finger Test normal.     Comments: Cranial nerves II through XII grossly intact no difficulty with word finding following two-step commands there is no unilateral weakness present.  Psychiatric:        Mood and Affect: Mood normal.     ED Results / Procedures / Treatments   Labs (all labs ordered are listed, but only abnormal results are displayed) Labs Reviewed  PROTIME-INR -  Abnormal; Notable for the following components:      Result Value   Prothrombin Time 21.7 (*)    INR 1.9 (*)    All other components within normal limits  APTT - Abnormal; Notable for the following components:   aPTT 45 (*)    All other components within normal limits  CBC - Abnormal; Notable for the following components:   WBC 3.3 (*)    Hemoglobin 10.1 (*)    HCT 32.2 (*)    MCV 75.9 (*)    MCH 23.8 (*)    RDW 19.6 (*)    nRBC 1.2 (*)    All other components within normal limits  DIFFERENTIAL - Abnormal; Notable for the following components:   Neutro Abs 1.5 (*)    All other components within normal limits  COMPREHENSIVE METABOLIC PANEL - Abnormal; Notable for the following components:   CO2 17 (*)    BUN 25 (*)    Creatinine, Ser 1.96 (*)    Albumin 2.9 (*)    GFR, Estimated 38 (*)    All other components within normal limits  RESP PANEL BY RT-PCR (FLU A&B, COVID) ARPGX2  ETHANOL  URINALYSIS, ROUTINE W REFLEX MICROSCOPIC  RAPID URINE DRUG SCREEN, HOSP PERFORMED  CBG MONITORING, ED    EKG EKG Interpretation  Date/Time:  Thursday February 17 2022 18:22:56 EST Ventricular Rate:  130 PR Interval:    QRS Duration: 84 QT Interval:  308 QTC Calculation: 453 R Axis:   179 Text Interpretation: Atrial fibrillation with rapid ventricular response Right axis deviation Anteroseptal infarct , age undetermined Abnormal ECG When compared with ECG of  15-Jan-2022 22:40, PREVIOUS ECG IS PRESENT Confirmed by Ross Marcus ([id number]) on 02/18/2022 1:23:41 AM  Radiology CT ANGIO HEAD NECK W WO CM  Addendum Date: 02/18/2022   ADDENDUM REPORT: 02/18/2022 01:01 ADDENDUM: There is a 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 Electronically Signed   By: Deatra Robinson M.D.   On: 02/18/2022 01:01   Result Date: 02/18/2022 CLINICAL DATA:  Cerebral aneurysm EXAM: CT ANGIOGRAPHY HEAD AND NECK TECHNIQUE: Multidetector CT imaging of the head and neck was performed using the standard protocol during bolus administration of intravenous contrast. Multiplanar CT image reconstructions and MIPs were obtained to evaluate the vascular anatomy. Carotid stenosis measurements (when applicable) are obtained utilizing NASCET criteria, using the distal internal carotid diameter as the denominator. RADIATION DOSE REDUCTION: This exam was performed according to the departmental dose-optimization program which includes automated exposure control, adjustment of the mA and/or kV according to patient size and/or use of iterative reconstruction technique. CONTRAST:  87mL OMNIPAQUE IOHEXOL 350 MG/ML SOLN COMPARISON:  None Available. FINDINGS: CTA NECK FINDINGS SKELETON: There is no bony spinal canal stenosis. No lytic or blastic lesion. OTHER NECK: Normal pharynx, larynx and major salivary glands. No cervical lymphadenopathy. Unremarkable thyroid gland. UPPER CHEST: No pneumothorax or pleural effusion. No nodules or masses. AORTIC ARCH: There is calcific atherosclerosis of the aortic arch. There is no aneurysm, dissection or hemodynamically significant stenosis of the visualized portion of the aorta. Conventional 3 vessel aortic branching pattern. The visualized proximal subclavian arteries are widely patent. RIGHT CAROTID SYSTEM: No dissection, occlusion or aneurysm. Mild atherosclerotic calcification at the carotid bifurcation without hemodynamically significant stenosis.  LEFT CAROTID SYSTEM: No dissection, occlusion or aneurysm. Mild atherosclerotic calcification at the carotid bifurcation without hemodynamically significant stenosis. VERTEBRAL ARTERIES: Left dominant configuration. Both origins are clearly patent. There is no dissection, occlusion or flow-limiting stenosis  to the skull base (V1-V3 segments). CTA HEAD FINDINGS POSTERIOR CIRCULATION: --Vertebral arteries: Normal V4 segments. --Inferior cerebellar arteries: Normal. --Basilar artery: Normal. --Superior cerebellar arteries: Normal. --Posterior cerebral arteries (PCA): Normal. ANTERIOR CIRCULATION: --Intracranial internal carotid arteries: Atherosclerotic calcification of the internal carotid arteries at the skull base without hemodynamically significant stenosis. --Anterior cerebral arteries (ACA): Normal. Both A1 segments are present. Patent anterior communicating artery (a-comm). --Middle cerebral arteries (MCA): Normal. VENOUS SINUSES: As permitted by contrast timing, patent. ANATOMIC VARIANTS: Fetal origin of the right posterior cerebral artery. Review of the MIP images confirms the above findings. IMPRESSION: 1. No emergent large vessel occlusion or hemodynamically significant stenosis of the head or neck. 2. Mild bilateral carotid bifurcation atherosclerosis without hemodynamically significant stenosis. Aortic atherosclerosis (ICD10-I70.0). Electronically Signed: By: Ulyses Jarred M.D. On: 02/18/2022 00:53   MR BRAIN WO CONTRAST  Result Date: 02/18/2022 CLINICAL DATA:  Dizziness EXAM: MRI HEAD WITHOUT CONTRAST TECHNIQUE: Multiplanar, multiecho pulse sequences of the brain and surrounding structures were obtained without intravenous contrast. COMPARISON:  None Available. FINDINGS: Brain: No acute infarct, mass effect or extra-axial collection. Chronic microhemorrhages in the cerebellum. There is multifocal hyperintense T2-weighted signal within the white matter. Parenchymal volume and CSF spaces are normal.  Old right basal ganglia small vessel infarct. The midline structures are normal. Vascular: Major flow voids are preserved. Skull and upper cervical spine: Normal calvarium and skull base. Visualized upper cervical spine and soft tissues are normal. Sinuses/Orbits:No paranasal sinus fluid levels or advanced mucosal thickening. No mastoid or middle ear effusion. Normal orbits. IMPRESSION: 1. No acute intracranial abnormality. 2. Multiple chronic cerebellar microhemorrhages. 3. Findings of chronic small vessel ischemia and old right basal ganglia small vessel infarct. Electronically Signed   By: Ulyses Jarred M.D.   On: 02/18/2022 00:55   CT HEAD WO CONTRAST  Result Date: 02/17/2022 CLINICAL DATA:  Vertigo, peripheral dizziness EXAM: CT HEAD WITHOUT CONTRAST TECHNIQUE: Contiguous axial images were obtained from the base of the skull through the vertex without intravenous contrast. RADIATION DOSE REDUCTION: This exam was performed according to the departmental dose-optimization program which includes automated exposure control, adjustment of the mA and/or kV according to patient size and/or use of iterative reconstruction technique. COMPARISON:  CT head 01/14/2022 BRAIN: BRAIN Patchy and confluent areas of decreased attenuation are noted throughout the deep and periventricular white matter of the cerebral hemispheres bilaterally, compatible with chronic microvascular ischemic disease. No evidence of large-territorial acute infarction. No parenchymal hemorrhage. No mass lesion. No extra-axial collection. No mass effect or midline shift. No hydrocephalus. Basilar cisterns are patent. Vascular: No hyperdense vessel. Atherosclerotic calcifications are present within the cavernous internal carotid and vertebral arteries. Skull: No acute fracture or focal lesion. Sinuses/Orbits: Paranasal sinuses and mastoid air cells are clear. The orbits are unremarkable. Other: None. IMPRESSION: No acute intracranial abnormality.  Electronically Signed   By: Iven Finn M.D.   On: 02/17/2022 22:22   DG Chest 1 View  Result Date: 02/17/2022 CLINICAL DATA:  Cough EXAM: CHEST  1 VIEW COMPARISON:  01/12/2022, 01/21/2022 FINDINGS: Mild left lung base opacity. Right lung is clear. Stable cardiomediastinal silhouette with borderline cardiomegaly. Aortic atherosclerosis IMPRESSION: Mild left lung base opacity may be due to atelectasis or developing pneumonia Electronically Signed   By: Donavan Foil M.D.   On: 02/17/2022 20:04    Procedures .Critical Care  Performed by: Marcello Fennel, PA-C Authorized by: Marcello Fennel, PA-C   Critical care provider statement:    Critical care time (minutes):  30  Critical care time was exclusive of:  Separately billable procedures and treating other patients   Critical care was necessary to treat or prevent imminent or life-threatening deterioration of the following conditions:  Cardiac failure   Critical care was time spent personally by me on the following activities:  Development of treatment plan with patient or surrogate, discussions with consultants, evaluation of patient's response to treatment, examination of patient, ordering and review of laboratory studies, ordering and review of radiographic studies, ordering and performing treatments and interventions, pulse oximetry, re-evaluation of patient's condition and review of old charts   I assumed direction of critical care for this patient from another provider in my specialty: no     Care discussed with: admitting provider       Medications Ordered in ED Medications  sodium chloride flush (NS) 0.9 % injection 3 mL (3 mLs Intravenous Not Given 02/17/22 2304)  diltiazem (CARDIZEM) 1 mg/mL load via infusion 10 mg (10 mg Intravenous Bolus from Bag 02/18/22 0136)    And  diltiazem (CARDIZEM) 125 mg in dextrose 5% 125 mL (1 mg/mL) infusion (5 mg/hr Intravenous New Bag/Given 02/18/22 0135)  sodium chloride 0.9 % bolus  1,000 mL (0 mLs Intravenous Stopped 02/18/22 0142)  cloNIDine (CATAPRES) tablet 0.1 mg (0.1 mg Oral Given 02/17/22 2348)  ondansetron (ZOFRAN) injection 4 mg (4 mg Intravenous Given 02/17/22 2348)  iohexol (OMNIPAQUE) 350 MG/ML injection 75 mL (75 mLs Intravenous Contrast Given 02/18/22 0034)    ED Course/ Medical Decision Making/ A&P                           Medical Decision Making Amount and/or Complexity of Data Reviewed Labs: ordered. Radiology: ordered.  Risk Prescription drug management. Decision regarding hospitalization.   This patient presents to the ED for concern of dizziness, this involves an extensive number of treatment options, and is a complaint that carries with it a high risk of complications and morbidity.  The differential diagnosis includes CVA, intracranial head bleed, dissection, PE    Additional history obtained:  Additional history obtained from wife at bedside External records from outside source obtained and reviewed including cardiothoracic surgeon, infectious disease notes   Co morbidities that complicate the patient evaluation  A-fib currently on Eliquis,  Social Determinants of Health:  N/A    Lab Tests:  I Ordered, and personally interpreted labs.  The pertinent results include: CBC shows leukocytopenia with a white count of 3.3, hemoglobin 10.1, prothrombin time 21, INR 1.9, APTT 45, ethanol less than 10, CMP shows CO2 of 17 BUN 25 creatinine 1.96,   Imaging Studies ordered:  I ordered imaging studies including chest x-[place], CT head, MRI, CT angio head and neck I independently visualized and interpreted imaging which showed CT head negative, chest x-[place] reveals atelectasis versus possible developing pneumonia, MRI brain negative for acute findings, CT angio also negative acute findings I agree with the radiologist interpretation   Cardiac Monitoring:  The patient was maintained on a cardiac monitor.  I personally viewed and  interpreted the cardiac monitored which showed an underlying rhythm of: EKG A-fib   Medicines ordered and prescription drug management:  I ordered medication including fluids, clonidine I have reviewed the patients home medicines and have made adjustments as needed  Critical Interventions:  Despite fluids, patient remains in A-fib or ER with elevated BP, will start him on a Cardizem drip. As patient started on diltiazem drip for rate is now in the  90s, BP is 137/95  Reevaluation:  Presents with dizziness, triage obtain basic lab work-up which I personally reviewed, due to the continued dizziness and benign physical exam concern for possible cerebral CVA versus subarachnoid bleed, will obtain CTA head and neck as well as MR brain, will provide with fluids, continue to monitor  BP has a systolic of [id number], will provide him with a dose of clonidine as he did miss 2 of his dosages.   Imaging exam for evidence of acute infarct or bleed, suspect patient suffering from hypertensive emergency as well as A-fib RVR we will start him on a diltiazem drip  Patient has stabilized, recommend admission for continued monitoring he is agreement this plan will consult hospitalist team    Consultations Obtained:  I requested consultation with the hospitalist Dr. Nevada Crane,  and discussed lab and imaging findings as well as pertinent plan - they recommend: 1 her colleagues will admit the patient.    Test Considered:  CTA chest-deferred as my suspicion for PE or dissection is low at this time, not endorsing any pleuritic chest pain or stabbing-like pain, could be atypical for him to develop a PE while being compliant with Eliquis, no connective tissue disorders presentation atypical    Rule out low suspicion for internal head bleed and or mass as CT imaging is negative for acute findings.  Low suspicion for CVA or dissection of the vertebral or carotid artery as presentation atypical of etiology imaging is  all negative.  Low suspicion for meningitis as she has no meningeal sign present. I have low suspicion for ACS as history is atypical denies any chest pain, EKG without signs of ischemia.  Low suspicion for systemic infection as patient is nontoxic-appearing, vital signs reassuring, no obvious source infection noted on exam.     Dispostion and problem list  After consideration of the diagnostic results and the patients response to treatment, I feel that the patent would benefit from admission.  A-fib RVR-unclear provoking factor possible from dehydration caused by vertigo and nausea and vomiting currently on diltiazem drip will need continued monitoring Hypertensive emergency-currently uncontrolled while on diltiazem, poss provoked by difficulty taking medications due to nausea or vertigo.            Final Clinical Impression(s) / ED Diagnoses Final diagnoses:  Hypertensive emergency  Atrial fibrillation with RVR [facility])    Rx / DC Orders ED Discharge Orders     None         Aron Baba 02/18/22 0214    Elnora Morrison, MD 02/20/22 2119

## 2022-02-18 NOTE — Consult Note (Signed)
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 a limited echo for aortic valve measurements and to check LVEF for changes    CAD Hyperlipidemia with LDL goal < 70 Severe multivessel disease Initial plans for CABG earlier this year, now felt to be not a candidate for sternotomy Continue statin, BB He denies chest pain   Hypertension Hypertensive urgency PTA: coreg 12.5 mg BID, olmesartan 40 mg, 50 mg spironolactone, clonidine 0.1 mg TID, hydralazine 25 mg TID - would avoid hypotension in this patient given AS   Dizziness Lower extremity edema with wounds on right leg Recent septic arthritis Recent bacteremia ?PNA, ?UTI Pt reported subjective fevers, dizziness, nausea/vomiting, and poor PO intake prior to admission. This occurred after stopping IV ABX.  Recent OP visit with ID with normal sed rate and elevated CRP - normal lactic acid and  procalcitonin - would involve ID early to evaluate possibly re-emerging infection   CKD stage IIIb Acute on chronic renal insufficiency in the setting of uncontrolled hypertension and poor PO intake with N/V, renal function improving   Persistent atrial fibrillation Afib with RVR Chronic anticoagulation Continue anticoagulation for stroke PPX, may transition to heparin gtt if procedures planned PTA on coreg, no longer planning MAZE as he will not be going for sternotomy Cardizem gtt now running for rate control Would first titrate coreg for better rate control, if EF remains normal can add cardizem   PAD Prior leg wounds with angioplasty on left lower extremity Now with leg wounds on right lower leg May need repeat ABI with arterial duplex once more stable, likely wouldn't tolerate acutely due to pain    Risk Assessment/Risk Scores:    New York Heart Association (NYHA) Functional Class NYHA Class IV  CHA2DS2-VASc Score = 3   This indicates a 3.2% annual risk of stroke. The patient's score is based upon: CHF History: 1 HTN History: 1 Diabetes History: 0 Stroke History: 0 Vascular Disease History: 1 Age Score: 0 Gender Score: 0       For questions or updates, please contact [place] Please consult [email or web address] for contact info under    Signed, Ledora Bottcher, [place]  02/18/2022 3:18 PM

## 2022-02-18 NOTE — ED Notes (Signed)
ED provider at bedside at this time 

## 2022-02-18 NOTE — ED Notes (Signed)
Pt returned from MRI at this time
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
## 2022-02-19 ENCOUNTER — Inpatient Hospital Stay (HOSPITAL_COMMUNITY): Payer: Medicare Other

## 2022-02-19 DIAGNOSIS — I4891 Unspecified atrial fibrillation: Secondary | ICD-10-CM | POA: Diagnosis not present

## 2022-02-19 DIAGNOSIS — I5033 Acute on chronic diastolic (congestive) heart failure: Secondary | ICD-10-CM

## 2022-02-19 DIAGNOSIS — R9389 Abnormal findings on diagnostic imaging of other specified body structures: Secondary | ICD-10-CM | POA: Diagnosis not present

## 2022-02-19 DIAGNOSIS — N1831 Chronic kidney disease, stage 3a: Secondary | ICD-10-CM

## 2022-02-19 DIAGNOSIS — N3000 Acute cystitis without hematuria: Secondary | ICD-10-CM | POA: Diagnosis not present

## 2022-02-19 LAB — ECHOCARDIOGRAM LIMITED
AR max vel: 1.11 cm2
AV Area VTI: 1.15 cm2
AV Area mean vel: 0.97 cm2
AV Mean grad: 30.7 mmHg
AV Peak grad: 52.5 mmHg
Ao pk vel: 3.62 m/s
Area-P 1/2: 3.99 cm2
Calc EF: 35.9 %
Height: 69 in
P 1/2 time: 450 msec
S' Lateral: 2.5 cm
Single Plane A2C EF: 45.6 %
Single Plane A4C EF: 27 %
Weight: 2976 oz

## 2022-02-19 LAB — CBC
HCT: 30.1 % — ABNORMAL LOW (ref 39.0–52.0)
Hemoglobin: 9.2 g/dL — ABNORMAL LOW (ref 13.0–17.0)
MCH: 23.6 pg — ABNORMAL LOW (ref 26.0–34.0)
MCHC: 30.6 g/dL (ref 30.0–36.0)
MCV: 77.2 fL — ABNORMAL LOW (ref 80.0–100.0)
Platelets: 131 10*3/uL — ABNORMAL LOW (ref 150–400)
RBC: 3.9 MIL/uL — ABNORMAL LOW (ref 4.22–5.81)
RDW: 19.7 % — ABNORMAL HIGH (ref 11.5–15.5)
WBC: 3.1 10*3/uL — ABNORMAL LOW (ref 4.0–10.5)
nRBC: 1.6 % — ABNORMAL HIGH (ref 0.0–0.2)

## 2022-02-19 LAB — BASIC METABOLIC PANEL
Anion gap: 10 (ref 5–15)
BUN: 22 mg/dL (ref 8–23)
CO2: 19 mmol/L — ABNORMAL LOW (ref 22–32)
Calcium: 9.2 mg/dL (ref 8.9–10.3)
Chloride: 109 mmol/L (ref 98–111)
Creatinine, Ser: 1.61 mg/dL — ABNORMAL HIGH (ref 0.61–1.24)
GFR, Estimated: 48 mL/min — ABNORMAL LOW (ref >60)
Glucose, Bld: 93 mg/dL (ref 70–99)
Potassium: 4.3 mmol/L (ref 3.5–5.1)
Sodium: 138 mmol/L (ref 135–145)

## 2022-02-19 MED ORDER — IRBESARTAN 150 MG PO TABS
150.0000 mg | ORAL_TABLET | Freq: Every day | ORAL | Status: DC
  Administered 2022-02-19 – 2022-02-20 (×2): 150 mg via ORAL
  Filled 2022-02-19 (×2): qty 1

## 2022-02-19 MED ORDER — DILTIAZEM HCL 60 MG PO TABS
60.0000 mg | ORAL_TABLET | Freq: Three times a day (TID) | ORAL | Status: DC
  Administered 2022-02-19 – 2022-02-20 (×4): 60 mg via ORAL
  Filled 2022-02-19 (×4): qty 1

## 2022-02-19 MED ORDER — IRBESARTAN 150 MG PO TABS: 300.0000 mg | ORAL_TABLET | Freq: Every day | ORAL | Status: DC

## 2022-02-19 NOTE — Progress Notes (Signed)
[facility] PROGRESS NOTE  Rhian Funari (DOB: Mar 30, 1961) ESP:[id number] PCP: [name], MD  Brief Narrative: [name] is a 61 y.o. male with medical history significant of hypertension, hyperlipidemia, diastolic congestive heart failure, atrial fibrillation on chronic anticoagulation, aortic stenosis, CKD stage IIIa, gout, and OSA not on CPAP who presents with complaints of feeling off balance with nausea and vomiting starting 3 days ago.  History is obtained mostly from the patient's wife who is present at bedside.   Patient been admitted and treated in September with Streptococcus bacteremia.  Admitted again in October with CoNS septic arthritis of the right knee with cultures growing staph cohnii and auricularis.  Patient had PICC line placed and was transition from penicillin to daptomycin through 11/21.  ID switched daptomycin to linezolid on 11/10.  PICC line was discontinued 11/15.  He had been taking the linezolid as instructed until his a follow-up appointment on 11/15 where he was told to stop antibiotics.  Ever since he stopped the IV antibiotics he is not felt well.  Report subjective fevers at home.  He has been having pain in his right leg which have been wrapped up until his office appointment with ID the other day when it was unwrapped.  He had developed a blister on the posterior part of the right leg.  Notes that its been weeping fluid and he has had associated swelling.  Denies any redness.  His wife notes that his blood pressures have also been significantly elevated in the 200s.  During the prior hospitalization he had  hypotension for which his blood pressure medications were decreased.  Patient reports that he has been feeling dizzy especially with getting up and feels short of breath even walking a few feet.  Denied any significant cough.  She also reports that the patient had been confused and saying things that did not make sense with slurred speech for which she  question him having a stroke.  He has been taking his medications, but with nausea and vomiting his wife is unsure of what things stayed down.  There were plans for him to have surgery to correct his aortic stenosis by Dr. Caffie Pinto, but has been postponed due to his acute infection.   In the emergency department patient was noted to be afebrile with heart rates elevated up to 134 in atrial fibrillation, mild tachypnea, and blood pressures elevated up to 213/135.  Labs revealed WBC 3.3, hemoglobin 10.1 low MCV/MCH, potassium 5, BUN 25, creatinine 1.96, and INR 1.9.  Chest x-[facility] noted a left lobe opacity concerning for atelectasis or developing pneumonia.  CT scan of the head and CTA of the head and neck did not reveal any acute intracranial abnormality or large vessel occlusion.  MRI of the brain revealed no acute intracranial abnormality with multiple chronic cerebellar microhemorrhages, and prior old right basal ganglia infarct with chronic small vessel ischemia.  Influenza and COVID-19 screening were negative.  Patient has been given 1 L normal saline IV fluids, antiemetics, started on Cardizem drip, and clonidine 0.1 mg p.o.  Subjective: Feels a bit better overall than at admission. No fevers, chills, chest pain, dyspnea.   Objective: BP (!) 154/107 (BP Location: Right Arm)   Pulse 63   Temp 98.7 F (37.1 C)   Resp 18   Ht _0  (1.753 m)   Wt 84.4 kg   SpO2 99%   BMI 27.47 kg/m   Gen: No distress Pulm: Clear, normal effort  CV: Irreg irreg, rate  in 70's, +pitting dependent edema. GI: Soft, NT, ND, +BS  Neuro: Alert and oriented. No new focal deficits. Ext: Warm, no deformities Skin: No new rashes, lesions or ulcers on visualized skin   Assessment & Plan: Dizziness Hypertensive urgency Patient presents with complaints of feeling off balance with reports of nausea and vomiting.  On admission blood pressures elevated up to 213/135 and was found to be in atrial fibrillation. MRI of the  brain was negative for any acute abnormality and revealed old right basal ganglia infarct. - Continue coreg 733m BID, hydralazine 544mq8h. Continue clonidine 0.33m32mID. Irbesartan 150m34mded today.  - Continue monitoring. BPs overall much better.    SIRS: Patient reports having subjective fevers at home and was found to be tachycardic and with WBC 3.3 on admission.   - Monitor blood cultures   Atrial fibrillation with RVR:  - Rates much improved.  - Convert dilt gtt to po - Continue eliquis    Acute on chronic HFpEF: Remains volume overloaded - Continue IV lasix - Continue SGLT2i - Restarted ARB per cardiology.  - Monitor I/O, weights, BMP    Urinary tract infection: Urinalysis noted small hemoglobin with many bacteria and 6-10 WBCs per hpf.  Possibly reason for patient's vomiting, and malaise. - Urine culture pending. On CTX as below.   Abnormal chest x-[facility] Acute.  Chest x-[place] noted a left lower lobe opacity.  Patient has been having episodes of nausea and vomiting. No reports of cough or aspiration. PCT not significantly elevated.  - Empiric CTX given at admission - Recheck CXR intermittently.    Nausea and vomiting: Patient has been having episodes of nausea and vomiting related to feeling off balance. - Aspiration precautions - Elevation head of the bed - Diet as tolerated, advance today per pt preference, tolerance - Antiemetics as needed - Protonix 40 mg IV     Chronic kidney disease stage IIIa: With mild creatinine bump POA that has improved back to his baseline. CrCl at baseline appears to be >45ml733m.  - Continue to monitor kidney function daily   Severe aortic stenosis: Patient has significant aortic stenosis for which plans are for replacement with Dr. BartlCyndia Bent has been postponed due to recent infections. - Echo 11/18 with gradients similar to TTE 12/28/2021. - Continue outpatient follow-up with Dr. BartlCyndia BentIron deficiency anemia: Iron 17, TIBC 178, and  ferritin 531 on 10/3. - Continue iron supplement   CAD: Patient has obstructive coronary disease for which he was considered for CABG, but a good candidate due to infection time. No current angina.  -Continue aspirin, statin, and beta-blocker   PAD: History of angioplasty of the left tibial peroneal trunk and left peroneal artery in 10/2021.  He has known SMA stenosis. - Continue aspirin and statin   History of CVA: MRI noted for prior lacunar infarct in right basal ganglia infarct. -Continue statin and eliquis   History of bacteremia with right knee septic arthritis Patient had been recently treated and completed IV antibiotics daptomycin  for CoNS septic arthritis of the right knee with cultures growing staph cohnii and auricularis.    He had been treated with p.o. linezolid which was started on 11/10 and stopped on 11/15 after his follow-up appointment with ID.  PICC line was also removed on 11/15. - CRP negative, ESR 18, PCT 0.17. Knee clinically much improved.   Amarria Andreasen Patrecia Pour[facility] [email or web address] 02/19/2022, 3:18 PM

## 2022-02-19 NOTE — Progress Notes (Signed)
Progress Note  Patient Name: [name] Date of Encounter: 02/19/2022  [facility] Cardiologist: Donato Heinz, MD   Subjective   Patient admitted with A-fib with RVR and hypertensive urgency after recent admission for strep bacteremia/septic arthritis.  Feeling better today with improved breathing.  Rate well controlled in the 60s.  Inpatient Medications    Scheduled Meds:  allopurinol  100 mg Oral Daily   apixaban  5 mg Oral BID   aspirin EC  81 mg Oral Daily   carvedilol  25 mg Oral BID WC   cloNIDine  0.1 mg Oral TID   ferrous sulfate  325 mg Oral Q breakfast   gabapentin  800 mg Oral TID   hydrALAZINE  50 mg Oral Q8H   pantoprazole (PROTONIX) IV  40 mg Intravenous Q24H   sodium chloride flush  3 mL Intravenous Once   sodium chloride flush  3 mL Intravenous Q12H   Continuous Infusions:  cefTRIAXone (ROCEPHIN)  IV Stopped (02/18/22 2220)   diltiazem (CARDIZEM) infusion 12.5 mg/hr (02/19/22 0312)   PRN Meds: acetaminophen **OR** acetaminophen, albuterol, hydrALAZINE   Vital Signs    Vitals:   02/18/22 2100 02/19/22 0041 02/19/22 0510 02/19/22 0635  BP: (!) 141/100 (!) 141/88 (!) 147/95   Pulse: 77 72  63  Resp: 18 18  18   Temp: 98.3 F (36.8 C)   98.6 F (37 C)  TempSrc: Oral   Oral  SpO2: 100% 98%  99%  Weight:      Height:        Intake/Output Summary (Last 24 hours) at 02/19/2022 0743 Last data filed at 02/19/2022 0000 Gross per 24 hour  Intake 296.7 ml  Output 500 ml  Net -203.3 ml      02/17/2022    6:55 PM 02/16/2022   11:07 AM 01/26/2022   11:50 AM  Last 3 Weights  Weight (lbs) 186 lb 186 lb 186 lb  Weight (kg) 84.369 kg 84.369 kg 84.369 kg      Telemetry    Atrial fibrillation with controlled ventricular response- Personally Reviewed  ECG    No new EKG to review- Personally Reviewed  Physical Exam   GEN: No acute distress.   Neck: No JVD Cardiac: Irregularly irregular, no murmurs, rubs, or gallops.   Respiratory: Clear to auscultation bilaterally. GI: Soft, nontender, non-distended  MS: No edema; No deformity. Neuro:  Nonfocal  Psych: Normal affect   Labs    High Sensitivity Troponin:  No results for input(s): "TROPONINIHS" in the last 720 hours.    Chemistry Recent Labs  Lab 02/16/22 1122 02/17/22 1933 02/18/22 1040 02/19/22 0128  NA 139 139 138 138  K 4.6 5.0 4.9 4.3  CL 109 109 111 109  CO2 22 17* 18* 19*  GLUCOSE 99 96 89 93  BUN 22 25* 24* 22  CREATININE 1.75* 1.96* 1.65* 1.61*  CALCIUM 9.1 9.6 9.0 9.2  PROT 6.9 7.2  --   --   ALBUMIN  --  2.9*  --   --   AST 27 27  --   --   ALT 39 33  --   --   ALKPHOS  --  86  --   --   BILITOT 0.4 0.4  --   --   GFRNONAA  --  38* 47* 48*  ANIONGAP  --  13 9 10      Hematology Recent Labs  Lab 02/16/22 1122 02/17/22 1933 02/19/22 0128  WBC 3.7* 3.3*  3.1*  RBC 3.93* 4.24 3.90*  HGB 9.4* 10.1* 9.2*  HCT 32.0* 32.2* 30.1*  MCV 81.4 75.9* 77.2*  MCH 23.9* 23.8* 23.6*  MCHC 29.4* 31.4 30.6  RDW 18.3* 19.6* 19.7*  PLT 196 170 131*    BNP Recent Labs  Lab 02/18/22 1320  BNP 641.3*     DDimer No results for input(s): "DDIMER" in the last 168 hours.   CHA2DS2-VASc Score = 3   This indicates a 3.2% annual risk of stroke. The patient's score is based upon: CHF History: 1 HTN History: 1 Diabetes History: 0 Stroke History: 0 Vascular Disease History: 1 Age Score: 0 Gender Score: 0      Radiology    CT ANGIO HEAD NECK W WO CM  Addendum Date: 02/18/2022   ADDENDUM REPORT: 02/18/2022 01:01 ADDENDUM: There is a 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 Electronically Signed   By: Ulyses Jarred M.D.   On: 02/18/2022 01:01   Result Date: 02/18/2022 CLINICAL DATA:  Cerebral aneurysm EXAM: CT ANGIOGRAPHY HEAD AND NECK TECHNIQUE: Multidetector CT imaging of the head and neck was performed using the standard protocol during bolus administration of intravenous contrast. Multiplanar CT image  reconstructions and MIPs were obtained to evaluate the vascular anatomy. Carotid stenosis measurements (when applicable) are obtained utilizing NASCET criteria, using the distal internal carotid diameter as the denominator. RADIATION DOSE REDUCTION: This exam was performed according to the departmental dose-optimization program which includes automated exposure control, adjustment of the mA and/or kV according to patient size and/or use of iterative reconstruction technique. CONTRAST:  52mL OMNIPAQUE IOHEXOL 350 MG/ML SOLN COMPARISON:  None Available. FINDINGS: CTA NECK FINDINGS SKELETON: There is no bony spinal canal stenosis. No lytic or blastic lesion. OTHER NECK: Normal pharynx, larynx and major salivary glands. No cervical lymphadenopathy. Unremarkable thyroid gland. UPPER CHEST: No pneumothorax or pleural effusion. No nodules or masses. AORTIC ARCH: There is calcific atherosclerosis of the aortic arch. There is no aneurysm, dissection or hemodynamically significant stenosis of the visualized portion of the aorta. Conventional 3 vessel aortic branching pattern. The visualized proximal subclavian arteries are widely patent. RIGHT CAROTID SYSTEM: No dissection, occlusion or aneurysm. Mild atherosclerotic calcification at the carotid bifurcation without hemodynamically significant stenosis. LEFT CAROTID SYSTEM: No dissection, occlusion or aneurysm. Mild atherosclerotic calcification at the carotid bifurcation without hemodynamically significant stenosis. VERTEBRAL ARTERIES: Left dominant configuration. Both origins are clearly patent. There is no dissection, occlusion or flow-limiting stenosis to the skull base (V1-V3 segments). CTA HEAD FINDINGS POSTERIOR CIRCULATION: --Vertebral arteries: Normal V4 segments. --Inferior cerebellar arteries: Normal. --Basilar artery: Normal. --Superior cerebellar arteries: Normal. --Posterior cerebral arteries (PCA): Normal. ANTERIOR CIRCULATION: --Intracranial internal carotid  arteries: Atherosclerotic calcification of the internal carotid arteries at the skull base without hemodynamically significant stenosis. --Anterior cerebral arteries (ACA): Normal. Both A1 segments are present. Patent anterior communicating artery (a-comm). --Middle cerebral arteries (MCA): Normal. VENOUS SINUSES: As permitted by contrast timing, patent. ANATOMIC VARIANTS: Fetal origin of the right posterior cerebral artery. Review of the MIP images confirms the above findings. IMPRESSION: 1. No emergent large vessel occlusion or hemodynamically significant stenosis of the head or neck. 2. Mild bilateral carotid bifurcation atherosclerosis without hemodynamically significant stenosis. Aortic atherosclerosis (ICD10-I70.0). Electronically Signed: By: Ulyses Jarred M.D. On: 02/18/2022 00:53   MR BRAIN WO CONTRAST  Result Date: 02/18/2022 CLINICAL DATA:  Dizziness EXAM: MRI HEAD WITHOUT CONTRAST TECHNIQUE: Multiplanar, multiecho pulse sequences of the brain and surrounding structures were obtained without intravenous contrast. COMPARISON:  None Available. FINDINGS: Brain: No acute infarct, mass effect or extra-axial collection. Chronic microhemorrhages in the cerebellum. There is multifocal hyperintense T2-weighted signal within the white matter. Parenchymal volume and CSF spaces are normal. Old right basal ganglia small vessel infarct. The midline structures are normal. Vascular: Major flow voids are preserved. Skull and upper cervical spine: Normal calvarium and skull base. Visualized upper cervical spine and soft tissues are normal. Sinuses/Orbits:No paranasal sinus fluid levels or advanced mucosal thickening. No mastoid or middle ear effusion. Normal orbits. IMPRESSION: 1. No acute intracranial abnormality. 2. Multiple chronic cerebellar microhemorrhages. 3. Findings of chronic small vessel ischemia and old right basal ganglia small vessel infarct. Electronically Signed   By: Deatra Robinson M.D.   On: 02/18/2022  00:55   CT HEAD WO CONTRAST  Result Date: 02/17/2022 CLINICAL DATA:  Vertigo, peripheral dizziness EXAM: CT HEAD WITHOUT CONTRAST TECHNIQUE: Contiguous axial images were obtained from the base of the skull through the vertex without intravenous contrast. RADIATION DOSE REDUCTION: This exam was performed according to the departmental dose-optimization program which includes automated exposure control, adjustment of the mA and/or kV according to patient size and/or use of iterative reconstruction technique. COMPARISON:  CT head 01/14/2022 BRAIN: BRAIN Patchy and confluent areas of decreased attenuation are noted throughout the deep and periventricular white matter of the cerebral hemispheres bilaterally, compatible with chronic microvascular ischemic disease. No evidence of large-territorial acute infarction. No parenchymal hemorrhage. No mass lesion. No extra-axial collection. No mass effect or midline shift. No hydrocephalus. Basilar cisterns are patent. Vascular: No hyperdense vessel. Atherosclerotic calcifications are present within the cavernous internal carotid and vertebral arteries. Skull: No acute fracture or focal lesion. Sinuses/Orbits: Paranasal sinuses and mastoid air cells are clear. The orbits are unremarkable. Other: None. IMPRESSION: No acute intracranial abnormality. Electronically Signed   By: Tish Frederickson M.D.   On: 02/17/2022 22:22   DG Chest 1 View  Result Date: 02/17/2022 CLINICAL DATA:  Cough EXAM: CHEST  1 VIEW COMPARISON:  01/12/2022, 01/21/2022 FINDINGS: Mild left lung base opacity. Right lung is clear. Stable cardiomediastinal silhouette with borderline cardiomegaly. Aortic atherosclerosis IMPRESSION: Mild left lung base opacity may be due to atelectasis or developing pneumonia Electronically Signed   By: Jasmine Pang M.D.   On: 02/17/2022 20:04    Cardiac Studies   2D echo 12/28/2021 IMPRESSIONS   1. Left ventricular ejection fraction, by estimation, is 60 to 65%. The   left ventricle has normal function. The left ventricle has no regional  wall motion abnormalities. There is severe concentric left ventricular  hypertrophy. Left ventricular diastolic   function could not be evaluated. Elevated left atrial pressure.   2. Right ventricular systolic function is normal. The right ventricular  size is normal. There is mildly elevated pulmonary artery systolic  pressure.   3. Left atrial size was severely dilated.   4. Right atrial size was mildly dilated.   5. The mitral valve is normal in structure. Mild mitral valve  regurgitation. No evidence of mitral stenosis.   6. The aortic valve is tricuspid. Aortic valve regurgitation is mild.  Moderate to severe aortic valve stenosis.   7. Aortic dilatation noted. There is mild dilatation of the ascending  aorta, measuring 40 mm.   8. The inferior vena cava is normal in size with greater than 50%  respiratory variability, suggesting right atrial pressure of 3 mmHg.   Patient Profile     61 y.o. male with a hx of chronic diastolic heart failure, hypertension, aortic  stenosis, OSA not on CPAP, and hyperlipidemia who is being seen 02/18/2022 for the evaluation of CHF exacerbation at the request of Dr. Katrinka Blazing.   Assessment & Plan    # Acute on chronic diastolic HF:  -Admitted with acute on chronic diastolic CHF -He put out 500 cc yesterday.  Unclear if this is accurate.  He is net +796 cc since admission -Serum creatinine stable at 1.61 and potassium 4.3 -BNP yesterday 641 -  # Resistant HTN:  # Hypertensive urgency: -BP remains elevated today 147/95 mmHg -ARB has been on hold secondary to chronic CKD but creatinine  improving -Continue carvedilol 25, hydralazine 50 mg every 8 hrs -will restart home dose of olmesartan 40 mg daily and follow renal function closely  # Severe aortic stenosis:  -Initially planned for AVR however now getting TAVR evaluation due to multiple comorbidities that arouse.   -Mean  gradient 30 mmHg.  DI 0.28.   # PAD:  Angioplasty of the L tibioperoneal trunk and L peroneal artery on 10/2021.   -Continue aspirin and statin.   -Known SMA stenosis.    # Obstructive CAD:  -90% D2 and distal LAD lesions.   -He was going to get CABG but not a good candidate at this time.   -Continue aspirin 81 mg daily, carvedilol 25 mg twice daily and statin therapy   # CVA:  -Prior lacunar infarcts and basal ganglia infarct seen on brain MRI.   -No acute findings.    -Needs better control blood pressure    # Afib with RVR:  -Remains in atrial fibrillation with good rate control  -Continue carvedilol 25 mg twice daily  -transition IV diltiazem drip to p.o. Cardizem 60mg  q6 hours -Continue Eliquis 5 mg twice daily continue Eliquis.  Currently on diltiazem infusion with improved rate control.    # Bacteremia:  # R knee septic arthritis:  Presented with N/V.  Completed IV antibiotics and PICC removed and 11/15 antibiotics were completed. Subjective fever and hasn't felt well since that time.  Possible pneumonia as well.  Per IM and ID.     I have spent a total of 35 minutes with patient reviewing 2D echo, telemetry, EKGs, labs and examining patient as well as establishing an assessment and plan that was discussed with the patient.  > 50% of time was spent in direct patient care.    For questions or updates, please contact [facility] Please consult [email or web address] for contact info under        Signed, 12/15, MD  02/19/2022, 7:43 AM

## 2022-02-19 NOTE — Progress Notes (Signed)
  Echocardiogram 2D Echocardiogram has been performed.  [name] 02/19/2022, 11:54 AM

## 2022-02-19 NOTE — Evaluation (Addendum)
Physical Therapy Evaluation/Discharge Patient Details Name: [name] MRN: [id number] DOB: 1960-09-05 Today's Date: 02/19/2022  History of Present Illness  61 y.o. male admitted 11/16 with dizziness, vomiting, HTN urgency and AFib with RVR. PMhx: hospitalized 9/30-10/08/2021 with sepsis due to group A strep with ARF and admission 01/14/22 with Rt knee septic arthritis. HTN, CHF, HLD, gout, CKD, Afib, PAD, aortic stenosis, cellulitis, chronic back pain.  Clinical Impression  Pt familiar from prior admission with pt currently able to perform transfers and gait without assist or pain. Pt with use of RW as cane not currently in room and moving without need for cues or physical assist. Pt reports performing ADLs and mobility on his own at baseline with assist of family PRN if needed. HR 92-104 with activity without dizziness or SOB.  Pt currently at baseline functional status without need for further acute therapy with pt in agreement.      Recommendations for follow up therapy are one component of a multi-disciplinary discharge planning process, led by the attending physician.  Recommendations may be updated based on patient status, additional functional criteria and insurance authorization.  Follow Up Recommendations No PT follow up      Assistance Recommended at Discharge PRN  Patient can return home with the following  Assistance with cooking/housework;Assist for transportation    Equipment Recommendations None recommended by PT  Recommendations for Other Services       Functional Status Assessment Patient has not had a recent decline in their functional status     Precautions / Restrictions Precautions Precautions: Fall      Mobility  Bed Mobility Overal bed mobility: Modified Independent                  Transfers Overall transfer level: Modified independent                      Ambulation/Gait Ambulation/Gait assistance: Supervision Gait Distance (Feet):  200 Feet Assistive device: Rolling walker (2 wheels) Gait Pattern/deviations: Step-through pattern, Decreased stride length   Gait velocity interpretation: 1.31 - 2.62 ft/sec, indicative of limited community ambulator   General Gait Details: HR 93 with gait, pt with reliance on RW, limited by fatigue  Stairs            Wheelchair Mobility    Modified Rankin (Stroke Patients Only)       Balance Overall balance assessment: Mild deficits observed, not formally tested   Sitting balance-Leahy Scale: Good     Standing balance support: Bilateral upper extremity supported, Reliant on assistive device for balance Standing balance-Leahy Scale: Poor Standing balance comment: reliant on UE support                             Pertinent Vitals/Pain Pain Assessment Pain Assessment: No/denies pain    Home Living Family/patient expects to be discharged to:: Private residence Living Arrangements: Spouse/significant other;Children;Other relatives Available Help at Discharge: Family;Available 24 hours/day Type of Home: House Home Access: Stairs to enter   [employer] of Steps: 3   Home Layout: One level Home Equipment: Agricultural consultant (2 wheels);Cane - single point      Prior Function Prior Level of Function : Independent/Modified Independent             Mobility Comments: normally walks with cane ADLs Comments: independent     Hand Dominance        Extremity/Trunk Assessment   Upper Extremity Assessment  Upper Extremity Assessment: Overall WFL for tasks assessed    Lower Extremity Assessment Lower Extremity Assessment: Overall WFL for tasks assessed    Cervical / Trunk Assessment Cervical / Trunk Assessment: Normal  Communication   Communication: No difficulties  Cognition Arousal/Alertness: Awake/alert Behavior During Therapy: Flat affect Overall Cognitive Status: Within Functional Limits for tasks assessed                                           General Comments      Exercises     Assessment/Plan    PT Assessment Patient does not need any further PT services  PT Problem List         PT Treatment Interventions      PT Goals (Current goals can be found in the Care Plan section)  Acute Rehab PT Goals PT Goal Formulation: All assessment and education complete, DC therapy    Frequency       Co-evaluation               AM-PAC PT "6 Clicks" Mobility  Outcome Measure Help needed turning from your back to your side while in a flat bed without using bedrails?: None Help needed moving from lying on your back to sitting on the side of a flat bed without using bedrails?: None Help needed moving to and from a bed to a chair (including a wheelchair)?: None Help needed standing up from a chair using your arms (e.g., wheelchair or bedside chair)?: None Help needed to walk in hospital room?: A Little Help needed climbing 3-5 steps with a railing? : A Little 6 Click Score: 22    End of Session   Activity Tolerance: Patient tolerated treatment well Patient left: in bed;with call bell/phone within reach (pt without recliner in room and denied wanting to get recliner or up in chair) Nurse Communication: Mobility status PT Visit Diagnosis: Other abnormalities of gait and mobility (R26.89)    Time: [id number] PT Time Calculation (min) (ACUTE ONLY): 19 min   Charges:   PT Evaluation $PT Eval Low Complexity: 1 Low          [place], PT Acute Rehabilitation Services Office: [place] Nayleah Gamel 02/19/2022, 1:55 PM

## 2022-02-20 DIAGNOSIS — I4891 Unspecified atrial fibrillation: Secondary | ICD-10-CM | POA: Diagnosis not present

## 2022-02-20 DIAGNOSIS — I5033 Acute on chronic diastolic (congestive) heart failure: Secondary | ICD-10-CM | POA: Diagnosis not present

## 2022-02-20 DIAGNOSIS — R6510 Systemic inflammatory response syndrome (SIRS) of non-infectious origin without acute organ dysfunction: Secondary | ICD-10-CM | POA: Diagnosis not present

## 2022-02-20 DIAGNOSIS — R42 Dizziness and giddiness: Secondary | ICD-10-CM | POA: Diagnosis not present

## 2022-02-20 LAB — URINE CULTURE

## 2022-02-20 LAB — BASIC METABOLIC PANEL
Anion gap: 12 (ref 5–15)
BUN: 16 mg/dL (ref 8–23)
CO2: 21 mmol/L — ABNORMAL LOW (ref 22–32)
Calcium: 9.3 mg/dL (ref 8.9–10.3)
Chloride: 103 mmol/L (ref 98–111)
Creatinine, Ser: 1.25 mg/dL — ABNORMAL HIGH (ref 0.61–1.24)
GFR, Estimated: >60 mL/min (ref >60)
Glucose, Bld: 94 mg/dL (ref 70–99)
Potassium: 3.8 mmol/L (ref 3.5–5.1)
Sodium: 136 mmol/L (ref 135–145)

## 2022-02-20 LAB — MAGNESIUM: Magnesium: 1.7 mg/dL (ref 1.7–2.4)

## 2022-02-20 MED ORDER — DILTIAZEM HCL ER COATED BEADS 240 MG PO CP24
240.0000 mg | ORAL_CAPSULE | Freq: Every day | ORAL | Status: DC
  Administered 2022-02-20: 240 mg via ORAL
  Filled 2022-02-20: qty 1

## 2022-02-20 MED ORDER — FUROSEMIDE 40 MG PO TABS: 40.0000 mg | ORAL_TABLET | Freq: Every day | ORAL | 0 refills | Status: DC

## 2022-02-20 MED ORDER — CARVEDILOL 25 MG PO TABS: 25.0000 mg | ORAL_TABLET | Freq: Two times a day (BID) | ORAL | 0 refills | Status: DC

## 2022-02-20 MED ORDER — HYDRALAZINE HCL 50 MG PO TABS: 75.0000 mg | ORAL_TABLET | Freq: Three times a day (TID) | ORAL | Status: DC

## 2022-02-20 MED ORDER — HYDRALAZINE HCL 25 MG PO TABS: 75.0000 mg | ORAL_TABLET | Freq: Three times a day (TID) | ORAL | 0 refills | Status: DC

## 2022-02-20 MED ORDER — DILTIAZEM HCL ER COATED BEADS 240 MG PO CP24: 240.0000 mg | ORAL_CAPSULE | Freq: Every day | ORAL | 0 refills | Status: DC

## 2022-02-20 MED ORDER — CEPHALEXIN 500 MG PO CAPS: 500.0000 mg | ORAL_CAPSULE | Freq: Two times a day (BID) | ORAL | 0 refills | Status: AC

## 2022-02-20 NOTE — Discharge Summary (Signed)
Physician Discharge Summary   Patient: [name] MRN: [id number] DOB: 1960/07/03  Admit date:     02/17/2022  Discharge date: 02/20/22  Discharge Physician: Patrecia Pour   PCP: [name], MD   Recommendations at discharge:  Follow up with PCP in 1-2 weeks for recheck of vital signs, ECG, BMP Follow up with cardiology as arranged for 12/5.  Note multiple medication changes to improve control of AFib heart rate and resistant HTN. Coreg, hydralazine increased, lasix restarted, diltiazem started, and olmesartan, clonidine, and farxiga continued. Spironolactone held pending follow up. Continue follow up with CT surgery for eventual operative management of AS, CAD.  Discharge Diagnoses: Active Problems:   Hypertensive urgency   Dizziness   SIRS (systemic inflammatory response syndrome) (HCC)   Atrial fibrillation with RVR (HCC)   Acute on chronic diastolic (congestive) heart failure (HCC)   Urinary tract infection   Abnormal chest x-Balint   Nausea and vomiting   CKD (chronic kidney disease), stage III (HCC)   Severe aortic stenosis   Iron deficiency anemia   CAD in native artery   PAD (peripheral artery disease) (HCC)   History of CVA (cerebrovascular accident)   History of septic arthritis  Hospital Course: [name] is a 61 y.o. male with medical history significant of hypertension, hyperlipidemia, diastolic congestive heart failure, atrial fibrillation on chronic anticoagulation, aortic stenosis, CKD stage IIIa, gout, and OSA not on CPAP who presents with complaints of feeling off balance with nausea and vomiting starting 3 days ago.  History is obtained mostly from the patient's wife who is present at bedside.   Patient been admitted and treated in September with Streptococcus bacteremia.  Admitted again in October with CoNS septic arthritis of the right knee with cultures growing staph cohnii and auricularis.  Patient had PICC line placed and was transition from penicillin  to daptomycin through 11/21.  ID switched daptomycin to linezolid on 11/10.  PICC line was discontinued 11/15.  He had been taking the linezolid as instructed until his a follow-up appointment on 11/15 where he was told to stop antibiotics.  Ever since he stopped the IV antibiotics he is not felt well.  Report subjective fevers at home.  He has been having pain in his right leg which have been wrapped up until his office appointment with ID the other day when it was unwrapped.  He had developed a blister on the posterior part of the right leg.  Notes that its been weeping fluid and he has had associated swelling.  Denies any redness.  His wife notes that his blood pressures have also been significantly elevated in the 200s.  During the prior hospitalization he had  hypotension for which his blood pressure medications were decreased.  Patient reports that he has been feeling dizzy especially with getting up and feels short of breath even walking a few feet.  Denied any significant cough.  She also reports that the patient had been confused and saying things that did not make sense with slurred speech for which she question him having a stroke.  He has been taking his medications, but with nausea and vomiting his wife is unsure of what things stayed down.  There were plans for him to have surgery to correct his aortic stenosis by Dr. Caffie Pinto, but has been postponed due to his acute infection.   In the emergency department patient was noted to be afebrile with heart rates elevated up to 134 in atrial fibrillation, mild tachypnea, and  blood pressures elevated up to 213/135.  Labs revealed WBC 3.3, hemoglobin 10.1 low MCV/MCH, potassium 5, BUN 25, creatinine 1.96, and INR 1.9.  Chest x-Shropshire noted a left lobe opacity concerning for atelectasis or developing pneumonia.  CT scan of the head and CTA of the head and neck did not reveal any acute intracranial abnormality or large vessel occlusion.  MRI of the brain revealed no  acute intracranial abnormality with multiple chronic cerebellar microhemorrhages, and prior old right basal ganglia infarct with chronic small vessel ischemia.  Influenza and COVID-19 screening were negative.  Patient was given IV fluids, antiemetics, started on Cardizem drip, and clonidine 0.1 mg p.o. Medications were adjusted with improved rate control and resolution of HTN urgency. His symptoms have also improved.   Please see problem-oriented plan below.  Assessment and Plan: Dizziness Hypertensive urgency: Dizziness has resolved, not orthostatic by HR/BP nor symptoms when up with therapy. BP under improved control with medication adjustments below.  Patient presents with complaints of feeling off balance with reports of nausea and vomiting.  On admission blood pressures elevated up to 213/135 and was found to be in atrial fibrillation. MRI of the brain was negative for any acute abnormality and revealed old right basal ganglia infarct. - Continue coreg 34m BID, hydralazine [id number]. Continue clonidine 0.45m545mID. Irbesartan 150m82mstarted (formulary equivalent of home ARB) and Cr improved, BP improving.  - Continue monitoring. BPs overall much better.    Sepsis due to UTI: Patient reports having subjective fevers at home and was found to be tachycardic and with WBC 3.3 on admission. Blood cultures NGTD at discharge, urine culture pending. Urinalysis noted small hemoglobin with many bacteria and 6-10 WBCs per hpf.  Possibly reason for patient's vomiting, and malaise. - Continued CTX with improved symptoms, will DC on keflex.    Atrial fibrillation with RVR:  - Rates much improved.  - Converted dilt gtt to po - Continue eliquis    Acute on chronic HFpEF: Remains volume overloaded - Continue lasix 40mg38mhome, hold spiro for now. - Continue SGLT2i - Restarted ARB, and BP meds as above.  - Will need weight remeasured today prior to discharge.    Urinary tract infection:  - Urine culture  pending. On CTX as below.   Abnormal chest x-Pless Acute.  Chest x-Johansson noted a left lower lobe opacity.  Patient has been having episodes of nausea and vomiting. No reports of cough or aspiration. PCT not significantly elevated.  - Empiric CTX given at admission - Recheck CXR intermittently.     Nausea and vomiting: Patient has been having episodes of nausea and vomiting related to feeling off balance. - Aspiration precautions - Elevation head of the bed - Diet as tolerated, advance today per pt preference, tolerance - Antiemetics as needed - Protonix 40 mg IV     Chronic kidney disease stage IIIa: With mild creatinine bump POA that has improved back to his baseline. CrCl at baseline appears to be 45-60ml/53m  - Continue to monitor kidney function at follow up. Hold spiro for now.   Severe aortic stenosis: Patient has significant aortic stenosis for which plans are for replacement with Dr. BartleCyndia Benthas been postponed due to recent infections. - Echo 11/18 with gradients similar to TTE 12/28/2021. - Continue outpatient follow-up with Dr. BartleCyndia Bentron deficiency anemia: Iron 17, TIBC 178, and ferritin 531 on 10/3. - Continue iron supplement   CAD: Patient has obstructive coronary disease for which he  was considered for CABG, but a good candidate due to infection time. No current angina.  -Continue aspirin, statin, and beta-blocker   PAD: History of angioplasty of the left tibial peroneal trunk and left peroneal artery in 10/2021.  He has known SMA stenosis. - Continue aspirin and statin   History of CVA: MRI noted for prior lacunar infarct in right basal ganglia infarct. -Continue statin and eliquis   History of bacteremia with right knee septic arthritis Patient had been recently treated and completed IV antibiotics daptomycin  for CoNS septic arthritis of the right knee with cultures growing staph cohnii and auricularis.    He had been treated with p.o. linezolid which was  started on 11/10 and stopped on 11/15 after his follow-up appointment with ID.  PICC line was also removed on 11/15. - CRP negative, ESR 18, PCT 0.17. Knee clinically much improved.   Consultants: Cardiology Procedures performed: None  Disposition: Home Diet recommendation:  Discharge Diet Orders (From admission, onward)     Start     Ordered   02/20/22 0000  Diet - low sodium heart healthy        02/20/22 0936           DISCHARGE MEDICATION: Allergies as of 02/20/2022       Reactions   Adhesive [tape] Other (See Comments)   Makes the skin feel as if it is burning, will also bruise the skin. Pt. prefers paper tape   Latex Hives, Itching, Other (See Comments)   Burns skin, also        Medication List     STOP taking these medications    DAPTOmycin 500 MG injection Commonly known as: CUBICIN   linezolid 600 MG tablet Commonly known as: ZYVOX   spironolactone 50 MG tablet Commonly known as: ALDACTONE       TAKE these medications    acetaminophen 500 MG tablet Commonly known as: TYLENOL Take 500 mg by mouth 3 (three) times daily as needed for moderate pain.   allopurinol 100 MG tablet Commonly known as: ZYLOPRIM Take 1 tablet (100 mg total) by mouth daily.   apixaban 5 MG Tabs tablet Commonly known as: ELIQUIS Take 1 tablet (5 mg total) by mouth 2 (two) times daily.   aspirin EC 81 MG tablet Take 81 mg by mouth daily. Swallow whole.   atorvastatin 20 MG tablet Commonly known as: LIPITOR Take 1 tablet (20 mg total) by mouth daily. Resume this after your antibiotics are complete What changed: additional instructions   carvedilol 25 MG tablet Commonly known as: COREG Take 1 tablet (25 mg total) by mouth 2 (two) times daily with a meal. What changed:  medication strength how much to take   cephALEXin 500 MG capsule Commonly known as: KEFLEX Take 1 capsule (500 mg total) by mouth 2 (two) times daily for 5 days.   cloNIDine 0.1 MG tablet Commonly  known as: CATAPRES Take 1 tablet (0.1 mg total) by mouth 3 (three) times daily.   diltiazem 240 MG 24 hr capsule Commonly known as: CARDIZEM CD Take 1 capsule (240 mg total) by mouth daily. Start taking on: February 21, 2022   Farxiga 10 MG Tabs tablet Generic drug: dapagliflozin propanediol Take 10 mg by mouth daily.   ferrous sulfate 325 (65 FE) MG EC tablet Take 1 tablet (325 mg total) by mouth every other day.   furosemide 40 MG tablet Commonly known as: LASIX Take 1 tablet (40 mg total) by mouth daily. What  changed:  how much to take when to take this   gabapentin 400 MG capsule Commonly known as: NEURONTIN TAKE 2 CAPSULES(800 MG) BY MOUTH THREE TIMES DAILY What changed:  how much to take how to take this when to take this additional instructions   hydrALAZINE 25 MG tablet Commonly known as: APRESOLINE Take 3 tablets (75 mg total) by mouth every 8 (eight) hours. What changed: how much to take   magnesium oxide 400 MG tablet Commonly known as: MAG-OX Take 1 tablet (400 mg total) by mouth every other day.   olmesartan 40 MG tablet Commonly known as: BENICAR Take 40 mg by mouth daily.   omeprazole 40 MG capsule Commonly known as: PRILOSEC Take 1 capsule (40 mg total) by mouth daily.   potassium chloride SA 20 MEQ tablet Commonly known as: KLOR-CON M Take 1 tablet (20 mEq total) by mouth daily as needed. When you take your lasix What changed:  when to take this additional instructions        Follow-up Information     Donato Heinz, MD Follow up.   Specialties: Cardiology, Radiology Why: Please keep follow-up visit with [name] which is already scheduled for 03/08/2022 at 2:40pm. Contact information: [address] [place] [address] [phone]         [name], MD Follow up.   Specialty: Pulmonary Disease Contact information: [address] [place] [address] [phone]                 Discharge Exam: Danley Danker Weights   02/17/22 1855  Weight: 84.4 kg  No dizziness, lightheadedness, nausea or vomiting. Wants to go home. Feels well. Appears in no distress with clear lung fields, Irreg with rate in 80's, trace pitting edema, no JVD.   Condition at discharge: stable  The results of significant diagnostics from this hospitalization (including imaging, microbiology, ancillary and laboratory) are listed below for reference.   Imaging Studies: ECHOCARDIOGRAM LIMITED  Result Date: 02/19/2022    ECHOCARDIOGRAM LIMITED REPORT   Patient Name:   JAHVON Tierno Date of Exam: 02/19/2022 Medical Rec #:  [id number]  Height:       69.0 in Accession #:    [id number] Weight:       186.0 lb Date of Birth:  1960-11-16  BSA:          2.003 m Patient Age:    61 years   BP:           182/120 mmHg Patient Gender: M          HR:           85 bpm. Exam Location:  Inpatient Procedure: Cardiac Doppler, Limited Echo, 2D Echo and Limited Color Doppler Indications:    Congestive Heart Failure I50.9  History:        Patient has prior history of Echocardiogram examinations, most                 recent 12/28/2021. CHF, CAD, PAD and CVA, Aortic Valve Disease,                 Arrythmias:Atrial Fibrillation; Risk Factors:Hypertension,                 Dyslipidemia and Sleep Apnea.  Sonographer:    Greer Pickerel Referring Phys: [id number] [place] [facility]  Sonographer Comments: Image acquisition challenging due to respiratory motion. IMPRESSIONS  1. Left ventricular ejection fraction, by estimation, is 55%. The  left ventricle has no regional wall motion abnormalities. There is severe concentric left ventricular hypertrophy. Left ventricular diastolic parameters are indeterminate.  2. Right ventricular systolic function was not well visualized. The right ventricular size is not well visualized.  3. Left atrial size was moderately dilated.  4. Right atrial size was mildly dilated.  5. The mitral valve is abnormal. Mild  mitral valve regurgitation. No evidence of mitral stenosis.  6. Gradients similar to TTE done 12/28/21 Likely tri cuspid . The aortic valve was not well visualized. There is severe calcifcation of the aortic valve. There is severe thickening of the aortic valve. Aortic valve regurgitation is mild. Moderate to severe aortic valve stenosis.  7. The inferior vena cava is normal in size with greater than 50% respiratory variability, suggesting right atrial pressure of 3 mmHg. FINDINGS  Left Ventricle: Left ventricular ejection fraction, by estimation, is 55%. The left ventricle has no regional wall motion abnormalities. The left ventricular internal cavity size was normal in size. There is severe concentric left ventricular hypertrophy. Left ventricular diastolic parameters are indeterminate. Right Ventricle: The right ventricular size is not well visualized. Right vetricular wall thickness was not assessed. Right ventricular systolic function was not well visualized. Left Atrium: Left atrial size was moderately dilated. Right Atrium: Right atrial size was mildly dilated. Pericardium: There is no evidence of pericardial effusion. Mitral Valve: The mitral valve is abnormal. There is moderate thickening of the mitral valve leaflet(s). There is moderate calcification of the mitral valve leaflet(s). Mild mitral valve regurgitation. No evidence of mitral valve stenosis. Tricuspid Valve: The tricuspid valve is normal in structure. Tricuspid valve regurgitation is not demonstrated. No evidence of tricuspid stenosis. Aortic Valve: Gradients similar to TTE done 12/28/21 Likely tri cuspid. The aortic valve was not well visualized. There is severe calcifcation of the aortic valve. There is severe thickening of the aortic valve. Aortic valve regurgitation is mild. Aortic regurgitation PHT measures 450 msec. Moderate to severe aortic stenosis is present. Aortic valve mean gradient measures 30.7 mmHg. Aortic valve peak gradient  measures 52.5 mmHg. Aortic valve area, by VTI measures 1.15 cm. Pulmonic Valve: The pulmonic valve was normal in structure. Pulmonic valve regurgitation is not visualized. No evidence of pulmonic stenosis. Aorta: The aortic root is normal in size and structure. Venous: The inferior vena cava is normal in size with greater than 50% respiratory variability, suggesting right atrial pressure of 3 mmHg. IAS/Shunts: No atrial level shunt detected by color flow Doppler. Additional Comments: Spectral Doppler performed. Color Doppler performed.  LEFT VENTRICLE PLAX 2D LVIDd:         3.00 cm LVIDs:         2.50 cm LV PW:         2.50 cm LV IVS:        2.00 cm LVOT diam:     2.20 cm LV SV:         80 LV SV Index:   40 LVOT Area:     3.80 cm  LV Volumes (MOD) LV vol d, MOD A2C: 135.0 ml LV vol d, MOD A4C: 129.0 ml LV vol s, MOD A2C: 73.4 ml LV vol s, MOD A4C: 94.2 ml LV SV MOD A2C:     61.6 ml LV SV MOD A4C:     129.0 ml LV SV MOD BP:      48.0 ml LEFT ATRIUM         Index LA diam:    4.90 cm 2.45 cm/m  AORTIC VALVE AV Area (Vmax):    1.11 cm AV Area (Vmean):   0.97 cm AV Area (VTI):     1.15 cm AV Vmax:           362.33 cm/s AV Vmean:          258.667 cm/s AV VTI:            0.693 m AV Peak Grad:      52.5 mmHg AV Mean Grad:      30.7 mmHg LVOT Vmax:         106.25 cm/s LVOT Vmean:        65.700 cm/s LVOT VTI:          0.210 m LVOT/AV VTI ratio: 0.30 AI PHT:            450 msec MITRAL VALVE MV Area (PHT): 3.99 cm    SHUNTS MV Decel Time: 190 msec    Systemic VTI:  0.21 m MV E velocity: 97.70 cm/s  Systemic Diam: 2.20 cm MV A velocity: 34.10 cm/s MV E/A ratio:  2.87 Jenkins Rouge MD Electronically signed by Jenkins Rouge MD Signature Date/Time: 02/19/2022/12:45:05 PM    Final    CT ANGIO HEAD NECK W WO CM  Addendum Date: 02/18/2022   ADDENDUM REPORT: 02/18/2022 01:01 ADDENDUM: There is a 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 Electronically Signed   By: Ulyses Jarred M.D.   On: 02/18/2022 01:01    Result Date: 02/18/2022 CLINICAL DATA:  Cerebral aneurysm EXAM: CT ANGIOGRAPHY HEAD AND NECK TECHNIQUE: Multidetector CT imaging of the head and neck was performed using the standard protocol during bolus administration of intravenous contrast. Multiplanar CT image reconstructions and MIPs were obtained to evaluate the vascular anatomy. Carotid stenosis measurements (when applicable) are obtained utilizing NASCET criteria, using the distal internal carotid diameter as the denominator. RADIATION DOSE REDUCTION: This exam was performed according to the departmental dose-optimization program which includes automated exposure control, adjustment of the mA and/or kV according to patient size and/or use of iterative reconstruction technique. CONTRAST:  82m OMNIPAQUE IOHEXOL 350 MG/ML SOLN COMPARISON:  None Available. FINDINGS: CTA NECK FINDINGS SKELETON: There is no bony spinal canal stenosis. No lytic or blastic lesion. OTHER NECK: Normal pharynx, larynx and major salivary glands. No cervical lymphadenopathy. Unremarkable thyroid gland. UPPER CHEST: No pneumothorax or pleural effusion. No nodules or masses. AORTIC ARCH: There is calcific atherosclerosis of the aortic arch. There is no aneurysm, dissection or hemodynamically significant stenosis of the visualized portion of the aorta. Conventional 3 vessel aortic branching pattern. The visualized proximal subclavian arteries are widely patent. RIGHT CAROTID SYSTEM: No dissection, occlusion or aneurysm. Mild atherosclerotic calcification at the carotid bifurcation without hemodynamically significant stenosis. LEFT CAROTID SYSTEM: No dissection, occlusion or aneurysm. Mild atherosclerotic calcification at the carotid bifurcation without hemodynamically significant stenosis. VERTEBRAL ARTERIES: Left dominant configuration. Both origins are clearly patent. There is no dissection, occlusion or flow-limiting stenosis to the skull base (V1-V3 segments). CTA HEAD FINDINGS  POSTERIOR CIRCULATION: --Vertebral arteries: Normal V4 segments. --Inferior cerebellar arteries: Normal. --Basilar artery: Normal. --Superior cerebellar arteries: Normal. --Posterior cerebral arteries (PCA): Normal. ANTERIOR CIRCULATION: --Intracranial internal carotid arteries: Atherosclerotic calcification of the internal carotid arteries at the skull base without hemodynamically significant stenosis. --Anterior cerebral arteries (ACA): Normal. Both A1 segments are present. Patent anterior communicating artery (a-comm). --Middle cerebral arteries (MCA): Normal. VENOUS SINUSES: As permitted by contrast timing, patent. ANATOMIC VARIANTS: Fetal origin of the right posterior cerebral artery.  Review of the MIP images confirms the above findings. IMPRESSION: 1. No emergent large vessel occlusion or hemodynamically significant stenosis of the head or neck. 2. Mild bilateral carotid bifurcation atherosclerosis without hemodynamically significant stenosis. Aortic atherosclerosis (ICD10-I70.0). Electronically Signed: By: Ulyses Jarred M.D. On: 02/18/2022 00:53   MR BRAIN WO CONTRAST  Result Date: 02/18/2022 CLINICAL DATA:  Dizziness EXAM: MRI HEAD WITHOUT CONTRAST TECHNIQUE: Multiplanar, multiecho pulse sequences of the brain and surrounding structures were obtained without intravenous contrast. COMPARISON:  None Available. FINDINGS: Brain: No acute infarct, mass effect or extra-axial collection. Chronic microhemorrhages in the cerebellum. There is multifocal hyperintense T2-weighted signal within the white matter. Parenchymal volume and CSF spaces are normal. Old right basal ganglia small vessel infarct. The midline structures are normal. Vascular: Major flow voids are preserved. Skull and upper cervical spine: Normal calvarium and skull base. Visualized upper cervical spine and soft tissues are normal. Sinuses/Orbits:No paranasal sinus fluid levels or advanced mucosal thickening. No mastoid or middle ear effusion.  Normal orbits. IMPRESSION: 1. No acute intracranial abnormality. 2. Multiple chronic cerebellar microhemorrhages. 3. Findings of chronic small vessel ischemia and old right basal ganglia small vessel infarct. Electronically Signed   By: Ulyses Jarred M.D.   On: 02/18/2022 00:55   CT HEAD WO CONTRAST  Result Date: 02/17/2022 CLINICAL DATA:  Vertigo, peripheral dizziness EXAM: CT HEAD WITHOUT CONTRAST TECHNIQUE: Contiguous axial images were obtained from the base of the skull through the vertex without intravenous contrast. RADIATION DOSE REDUCTION: This exam was performed according to the departmental dose-optimization program which includes automated exposure control, adjustment of the mA and/or kV according to patient size and/or use of iterative reconstruction technique. COMPARISON:  CT head 01/14/2022 BRAIN: BRAIN Patchy and confluent areas of decreased attenuation are noted throughout the deep and periventricular white matter of the cerebral hemispheres bilaterally, compatible with chronic microvascular ischemic disease. No evidence of large-territorial acute infarction. No parenchymal hemorrhage. No mass lesion. No extra-axial collection. No mass effect or midline shift. No hydrocephalus. Basilar cisterns are patent. Vascular: No hyperdense vessel. Atherosclerotic calcifications are present within the cavernous internal carotid and vertebral arteries. Skull: No acute fracture or focal lesion. Sinuses/Orbits: Paranasal sinuses and mastoid air cells are clear. The orbits are unremarkable. Other: None. IMPRESSION: No acute intracranial abnormality. Electronically Signed   By: Iven Finn M.D.   On: 02/17/2022 22:22   DG Chest 1 View  Result Date: 02/17/2022 CLINICAL DATA:  Cough EXAM: CHEST  1 VIEW COMPARISON:  01/12/2022, 01/21/2022 FINDINGS: Mild left lung base opacity. Right lung is clear. Stable cardiomediastinal silhouette with borderline cardiomegaly. Aortic atherosclerosis IMPRESSION: Mild  left lung base opacity may be due to atelectasis or developing pneumonia Electronically Signed   By: Donavan Foil M.D.   On: 02/17/2022 20:04   CT CORONARY MORPH W/CTA COR W/SCORE W/CA W/CM &/OR WO/CM  Addendum Date: 02/15/2022   ADDENDUM REPORT: 02/15/2022 16:21 EXAM: OVER-READ INTERPRETATION  CT CHEST The following report is an over-read performed by radiologist Dr. Maudry Diego [facility] on 02/15/2022. This over-read does not include interpretation of cardiac or coronary anatomy or pathology. The coronary CTA interpretation by the cardiologist is attached. COMPARISON:  None. FINDINGS: Vascular: Mild cardiomegaly. No pericardial effusion. Normal caliber thoracic aorta with mild atherosclerotic disease. No suspicious filling defects of the central pulmonary arteries. Mediastinum/Nodes: Esophagus unremarkable. No pathologically enlarged lymph nodes seen in the chest. Lungs/Pleura: Central airways are patent. Mild paraseptal emphysema. No consolidation, pleural effusion or pneumothorax. Mild left-greater-than-right bibasilar atelectasis Upper  Abdomen: No acute abnormality. Musculoskeletal: No chest wall mass or suspicious bone lesions identified. IMPRESSION: 1. No acute extracardiac abnormality. 2. Aortic Atherosclerosis (ICD10-I70.0) and Emphysema (ICD10-J43.9). Electronically Signed   By: Yetta Glassman M.D.   On: 02/15/2022 16:21   Addendum Date: 02/08/2022   ADDENDUM REPORT: 02/08/2022 14:06 Electronically Signed   By: Jenkins Rouge M.D.   On: 02/08/2022 14:06   Result Date: 02/15/2022 CLINICAL DATA:  Aortic Stenosis EXAM: Cardiac TAVR CT TECHNIQUE: The patient was scanned on a Siemens Force 353 slice scanner. A 120 kV retrospective scan was triggered in the ascending thoracic aorta at 140 HU's. Gantry rotation speed was 250 msecs and collimation was .6 mm. No beta blockade or nitro were given. The 3D data set was reconstructed in 5% intervals of the R-R cycle. Systolic and  diastolic phases were analyzed on a dedicated work station using MPR, MIP and VRT modes. The patient received 80 cc of contrast. FINDINGS: Aortic Valve: Tri leaflet calcified with score 3484 Aorta: Mild calcific atherosclerosis Bovine arch mild ascending thoracic aorta dilatation Sino-tubular Junction: 32 mm mild calcium Ascending Thoracic Aorta: 38 mm Aortic Arch: 3.0 cm Descending Thoracic Aorta: 28 mm Sinus of Valsalva Measurements: Non-coronary: 36.2 mm Right - coronary: 32.5 mm Left -   coronary: 37.2 mm Coronary Artery Height above Annulus: Left Main: 16.5 mm above annulus Right Coronary: 18.3 mm above annulus Virtual Basal Annulus Measurements: Maximum / Minimum Diameter: 31.5 mm x 24.9 mm Average diameter 28.8 mm Perimeter: 93 mm Area: 650 mm2 Coronary Arteries: Sufficient height above annulus for deployment Optimum Fluoroscopic Angle for Delivery: LAO 16 Caudal 13 degrees IMPRESSION: 1.  Calcified tri leaflet AV with score 3484 2.  Annular area of 650 mm2 suitable for a 29 mm Sapien 3 valve 3. Optimum angiographic angle for deployment LAO 16 Caudal 13 degrees 4.  Coronary arteries sufficient height above annulus for deployment 5.  Membranous septal length 6.5 mm 6. Mixing artifact in LAA cannot r/o thrombus in non constrast images Patient in rapid fib/flutter during exam Jenkins Rouge Electronically Signed: By: Jenkins Rouge M.D. On: 02/08/2022 12:12   DG CHEST PORT 1 VIEW  Result Date: 01/21/2022 CLINICAL DATA:  Shortness of breath EXAM: PORTABLE CHEST 1 VIEW COMPARISON:  01/20/2022 FINDINGS: Unchanged position of right arm PICC with tip in the upper SVC. Unchanged cardiac and mediastinal contours. No focal pulmonary opacity. No pleural effusion or pneumothorax. No acute osseous abnormality. IMPRESSION: No acute cardiopulmonary process. Electronically Signed   By: Merilyn Baba M.D.   On: 01/21/2022 13:41    Microbiology: Results for orders placed or performed during the hospital encounter of  02/17/22  Resp Panel by RT-PCR (Flu A&B, Covid) Anterior Nasal Swab     Status: None   Collection Time: 02/18/22 12:55 AM   Specimen: Anterior Nasal Swab  Result Value Ref Range Status   SARS Coronavirus 2 by RT PCR NEGATIVE NEGATIVE Final    Comment: (NOTE) SARS-CoV-2 target nucleic acids are NOT DETECTED.  The SARS-CoV-2 RNA is generally detectable in upper respiratory specimens during the acute phase of infection. The lowest concentration of SARS-CoV-2 viral copies this assay can detect is 138 copies/mL. A negative result does not preclude SARS-Cov-2 infection and should not be used as the sole basis for treatment or other patient management decisions. A negative result may occur with  improper specimen collection/handling, submission of specimen other than nasopharyngeal swab, presence of viral mutation(s) within the areas targeted by this assay, and inadequate number  of viral copies(<138 copies/mL). A negative result must be combined with clinical observations, patient history, and epidemiological information. The expected result is Negative.  Fact Sheet for Patients:  EntrepreneurPulse.com.au  Fact Sheet for Healthcare Providers:  IncredibleEmployment.be  This test is no t yet approved or cleared by the [place] FDA and  has been authorized for detection and/or diagnosis of SARS-CoV-2 by FDA under an Emergency Use Authorization (EUA). This EUA will remain  in effect (meaning this test can be used) for the duration of the COVID-19 declaration under Section 564(b)(1) of the Act, 21 U.S.C.section 360bbb-3(b)(1), unless the authorization is terminated  or revoked sooner.       Influenza A by PCR NEGATIVE NEGATIVE Final   Influenza B by PCR NEGATIVE NEGATIVE Final    Comment: (NOTE) The Xpert Xpress SARS-CoV-2/FLU/RSV plus assay is intended as an aid in the diagnosis of influenza from Nasopharyngeal swab specimens and should not be  used as a sole basis for treatment. Nasal washings and aspirates are unacceptable for Xpert Xpress SARS-CoV-2/FLU/RSV testing.  Fact Sheet for Patients: EntrepreneurPulse.com.au  Fact Sheet for Healthcare Providers: IncredibleEmployment.be  This test is not yet approved or cleared by the [place] FDA and has been authorized for detection and/or diagnosis of SARS-CoV-2 by FDA under an Emergency Use Authorization (EUA). This EUA will remain in effect (meaning this test can be used) for the duration of the COVID-19 declaration under Section 564(b)(1) of the Act, 21 U.S.C. section 360bbb-3(b)(1), unless the authorization is terminated or revoked.  Performed at [facility], [place] [address] [place], [place] [address]   Culture, blood (Routine X 2) w Reflex to ID Panel     Status: None (Preliminary result)   Collection Time: 02/18/22 11:04 AM   Specimen: BLOOD  Result Value Ref Range Status   Specimen Description BLOOD BLOOD RIGHT HAND  Final   Special Requests   Final    BOTTLES DRAWN AEROBIC AND ANAEROBIC Blood Culture adequate volume   Culture   Final    NO GROWTH 2 DAYS Performed at [facility], [place] [address], [place], [place] [address]    Report Status PENDING  Incomplete  Culture, blood (Routine X 2) w Reflex to ID Panel     Status: None (Preliminary result)   Collection Time: 02/18/22  6:49 PM   Specimen: BLOOD RIGHT HAND  Result Value Ref Range Status   Specimen Description BLOOD RIGHT HAND  Final   Special Requests IN PEDIATRIC BOTTLE Blood Culture adequate volume  Final   Culture   Final    NO GROWTH 2 DAYS Performed at [facility], [place] [address], [place], [place] [address]    Report Status PENDING  Incomplete    Labs: CBC: Recent Labs  Lab 02/16/22 1122 02/17/22 1933 02/19/22 0128  WBC 3.7* 3.3* 3.1*  NEUTROABS 1,950 1.5*  --   HGB 9.4* 10.1* 9.2*  HCT 32.0* 32.2* 30.1*  MCV 81.4 75.9* 77.2*   PLT 196 170 160*   Basic Metabolic Panel: Recent Labs  Lab 02/16/22 1122 02/17/22 1933 02/18/22 1040 02/19/22 0128 02/20/22 0747  NA 139 139 138 138 136  K 4.6 5.0 4.9 4.3 3.8  CL 109 109 111 109 103  CO2 22 17* 18* 19* 21*  GLUCOSE 99 96 89 93 94  BUN 22 25* 24* 22 16  CREATININE 1.75* 1.96* 1.65* 1.61* 1.25*  CALCIUM 9.1 9.6 9.0 9.2 9.3  MG  --   --  1.9  --  1.7   Liver Function Tests: Recent Labs  Lab 02/16/22 1122 02/17/22 1933  AST 27 27  ALT 39 33  ALKPHOS  --  86  BILITOT 0.4 0.4  PROT 6.9 7.2  ALBUMIN  --  2.9*   CBG: Recent Labs  Lab 02/17/22 1922  GLUCAP 94    Discharge time spent: greater than 30 minutes.  Signed: Patrecia Pour, MD [facility] 02/20/2022

## 2022-02-20 NOTE — Plan of Care (Signed)

## 2022-02-20 NOTE — Progress Notes (Signed)
Ccmd notified or dc order. Piv dcd. Site unremarkable. Patient verbalized understanding of dc instructions.All documents and belongings given to patient.

## 2022-02-20 NOTE — Progress Notes (Signed)
Progress Note  Patient Name: [name] Date of Encounter: 02/20/2022  [facility] Cardiologist: Donato Heinz, MD   Subjective     Inpatient Medications    Scheduled Meds:  allopurinol  100 mg Oral Daily   apixaban  5 mg Oral BID   aspirin EC  81 mg Oral Daily   carvedilol  25 mg Oral BID WC   cloNIDine  0.1 mg Oral TID   diltiazem  60 mg Oral Q8H   ferrous sulfate  325 mg Oral Q breakfast   gabapentin  800 mg Oral TID   hydrALAZINE  50 mg Oral Q8H   irbesartan  150 mg Oral Daily   pantoprazole (PROTONIX) IV  40 mg Intravenous Q24H   sodium chloride flush  3 mL Intravenous Once   sodium chloride flush  3 mL Intravenous Q12H   Continuous Infusions:  cefTRIAXone (ROCEPHIN)  IV 2 g (02/19/22 2126)   PRN Meds: acetaminophen **OR** acetaminophen, albuterol, hydrALAZINE   Vital Signs    Vitals:   02/19/22 2130 02/19/22 2203 02/20/22 0235 02/20/22 0619  BP: (!) 157/97 (!) 153/99 (!) 133/93 (!) 173/101  Pulse:  71 76 94  Resp:  18 20 18   Temp:  98.5 F (36.9 C)  98.4 F (36.9 C)  TempSrc:  Oral  Oral  SpO2:  96% 97% 96%  Weight:      Height:        Intake/Output Summary (Last 24 hours) at 02/20/2022 0804 Last data filed at 02/19/2022 2100 Gross per 24 hour  Intake 942.15 ml  Output --  Net 942.15 ml       02/17/2022    6:55 PM 02/16/2022   11:07 AM 01/26/2022   11:50 AM  Last 3 Weights  Weight (lbs) 186 lb 186 lb 186 lb  Weight (kg) 84.369 kg 84.369 kg 84.369 kg      Telemetry    Atrial fibrillation with CVR- Personally Reviewed  ECG    No new EKG to review- Personally Reviewed  Physical Exam   GEN: Well nourished, well developed in no acute distress HEENT: Normal NECK: No JVD; No carotid bruits LYMPHATICS: No lymphadenopathy CARDIAC:irregularly irregular, no murmurs, rubs, gallops RESPIRATORY:  Clear to auscultation without rales, wheezing or rhonchi  ABDOMEN: Soft, non-tender, non-distended MUSCULOSKELETAL:  trace  edema; No deformity  SKIN: Warm and dry NEUROLOGIC:  Alert and oriented x 3 PSYCHIATRIC:  Normal affect  Labs    High Sensitivity Troponin:  No results for input(s): "TROPONINIHS" in the last 720 hours.    Chemistry Recent Labs  Lab 02/16/22 1122 02/17/22 1933 02/18/22 1040 02/19/22 0128  NA 139 139 138 138  K 4.6 5.0 4.9 4.3  CL 109 109 111 109  CO2 22 17* 18* 19*  GLUCOSE 99 96 89 93  BUN 22 25* 24* 22  CREATININE 1.75* 1.96* 1.65* 1.61*  CALCIUM 9.1 9.6 9.0 9.2  PROT 6.9 7.2  --   --   ALBUMIN  --  2.9*  --   --   AST 27 27  --   --   ALT 39 33  --   --   ALKPHOS  --  86  --   --   BILITOT 0.4 0.4  --   --   GFRNONAA  --  38* 47* 48*  ANIONGAP  --  13 9 10       Hematology Recent Labs  Lab 02/16/22 1122 02/17/22 1933 02/19/22 0128  WBC 3.7*  3.3* 3.1*  RBC 3.93* 4.24 3.90*  HGB 9.4* 10.1* 9.2*  HCT 32.0* 32.2* 30.1*  MCV 81.4 75.9* 77.2*  MCH 23.9* 23.8* 23.6*  MCHC 29.4* 31.4 30.6  RDW 18.3* 19.6* 19.7*  PLT 196 170 131*     BNP Recent Labs  Lab 02/18/22 1320  BNP 641.3*      DDimer No results for input(s): "DDIMER" in the last 168 hours.   CHA2DS2-VASc Score = 3   This indicates a 3.2% annual risk of stroke. The patient's score is based upon: CHF History: 1 HTN History: 1 Diabetes History: 0 Stroke History: 0 Vascular Disease History: 1 Age Score: 0 Gender Score: 0      Radiology    ECHOCARDIOGRAM LIMITED  Result Date: 02/19/2022    ECHOCARDIOGRAM LIMITED REPORT   Patient Name:   [name] Date of Exam: 02/19/2022 Medical Rec #:  [id number]  Height:       69.0 in Accession #:    [id number] Weight:       186.0 lb Date of Birth:  February 21, 1961  BSA:          2.003 m Patient Age:    61 years   BP:           182/120 mmHg Patient Gender: M          HR:           85 bpm. Exam Location:  Inpatient Procedure: Cardiac Doppler, Limited Echo, 2D Echo and Limited Color Doppler Indications:    Congestive Heart Failure I50.9  History:        Patient  has prior history of Echocardiogram examinations, most                 recent 12/28/2021. CHF, CAD, PAD and CVA, Aortic Valve Disease,                 Arrythmias:Atrial Fibrillation; Risk Factors:Hypertension,                 Dyslipidemia and Sleep Apnea.  Sonographer:    Aron Baba Referring Phys: [id number] ANGELA NICOLE DUKE  Sonographer Comments: Image acquisition challenging due to respiratory motion. IMPRESSIONS  1. Left ventricular ejection fraction, by estimation, is 55%. The left ventricle has no regional wall motion abnormalities. There is severe concentric left ventricular hypertrophy. Left ventricular diastolic parameters are indeterminate.  2. Right ventricular systolic function was not well visualized. The right ventricular size is not well visualized.  3. Left atrial size was moderately dilated.  4. Right atrial size was mildly dilated.  5. The mitral valve is abnormal. Mild mitral valve regurgitation. No evidence of mitral stenosis.  6. Gradients similar to TTE done 12/28/21 Likely tri cuspid . The aortic valve was not well visualized. There is severe calcifcation of the aortic valve. There is severe thickening of the aortic valve. Aortic valve regurgitation is mild. Moderate to severe aortic valve stenosis.  7. The inferior vena cava is normal in size with greater than 50% respiratory variability, suggesting right atrial pressure of 3 mmHg. FINDINGS  Left Ventricle: Left ventricular ejection fraction, by estimation, is 55%. The left ventricle has no regional wall motion abnormalities. The left ventricular internal cavity size was normal in size. There is severe concentric left ventricular hypertrophy. Left ventricular diastolic parameters are indeterminate. Right Ventricle: The right ventricular size is not well visualized. Right vetricular wall thickness was not assessed. Right ventricular systolic function was not well visualized. Left Atrium: Left atrial size  was moderately dilated. Right Atrium:  Right atrial size was mildly dilated. Pericardium: There is no evidence of pericardial effusion. Mitral Valve: The mitral valve is abnormal. There is moderate thickening of the mitral valve leaflet(s). There is moderate calcification of the mitral valve leaflet(s). Mild mitral valve regurgitation. No evidence of mitral valve stenosis. Tricuspid Valve: The tricuspid valve is normal in structure. Tricuspid valve regurgitation is not demonstrated. No evidence of tricuspid stenosis. Aortic Valve: Gradients similar to TTE done 12/28/21 Likely tri cuspid. The aortic valve was not well visualized. There is severe calcifcation of the aortic valve. There is severe thickening of the aortic valve. Aortic valve regurgitation is mild. Aortic regurgitation PHT measures 450 msec. Moderate to severe aortic stenosis is present. Aortic valve mean gradient measures 30.7 mmHg. Aortic valve peak gradient measures 52.5 mmHg. Aortic valve area, by VTI measures 1.15 cm. Pulmonic Valve: The pulmonic valve was normal in structure. Pulmonic valve regurgitation is not visualized. No evidence of pulmonic stenosis. Aorta: The aortic root is normal in size and structure. Venous: The inferior vena cava is normal in size with greater than 50% respiratory variability, suggesting right atrial pressure of 3 mmHg. IAS/Shunts: No atrial level shunt detected by color flow Doppler. Additional Comments: Spectral Doppler performed. Color Doppler performed.  LEFT VENTRICLE PLAX 2D LVIDd:         3.00 cm LVIDs:         2.50 cm LV PW:         2.50 cm LV IVS:        2.00 cm LVOT diam:     2.20 cm LV SV:         80 LV SV Index:   40 LVOT Area:     3.80 cm  LV Volumes (MOD) LV vol d, MOD A2C: 135.0 ml LV vol d, MOD A4C: 129.0 ml LV vol s, MOD A2C: 73.4 ml LV vol s, MOD A4C: 94.2 ml LV SV MOD A2C:     61.6 ml LV SV MOD A4C:     129.0 ml LV SV MOD BP:      48.0 ml LEFT ATRIUM         Index LA diam:    4.90 cm 2.45 cm/m  AORTIC VALVE AV Area (Vmax):    1.11 cm AV  Area (Vmean):   0.97 cm AV Area (VTI):     1.15 cm AV Vmax:           362.33 cm/s AV Vmean:          258.667 cm/s AV VTI:            0.693 m AV Peak Grad:      52.5 mmHg AV Mean Grad:      30.7 mmHg LVOT Vmax:         106.25 cm/s LVOT Vmean:        65.700 cm/s LVOT VTI:          0.210 m LVOT/AV VTI ratio: 0.30 AI PHT:            450 msec MITRAL VALVE MV Area (PHT): 3.99 cm    SHUNTS MV Decel Time: 190 msec    Systemic VTI:  0.21 m MV E velocity: 97.70 cm/s  Systemic Diam: 2.20 cm MV A velocity: 34.10 cm/s MV E/A ratio:  2.87 Charlton Haws MD Electronically signed by Charlton Haws MD Signature Date/Time: 02/19/2022/12:45:05 PM    Final     Cardiac Studies   2D  echo 12/28/2021 IMPRESSIONS   1. Left ventricular ejection fraction, by estimation, is 60 to 65%. The  left ventricle has normal function. The left ventricle has no regional  wall motion abnormalities. There is severe concentric left ventricular  hypertrophy. Left ventricular diastolic   function could not be evaluated. Elevated left atrial pressure.   2. Right ventricular systolic function is normal. The right ventricular  size is normal. There is mildly elevated pulmonary artery systolic  pressure.   3. Left atrial size was severely dilated.   4. Right atrial size was mildly dilated.   5. The mitral valve is normal in structure. Mild mitral valve  regurgitation. No evidence of mitral stenosis.   6. The aortic valve is tricuspid. Aortic valve regurgitation is mild.  Moderate to severe aortic valve stenosis.   7. Aortic dilatation noted. There is mild dilatation of the ascending  aorta, measuring 40 mm.   8. The inferior vena cava is normal in size with greater than 50%  respiratory variability, suggesting right atrial pressure of 3 mmHg.   Patient Profile     61 y.o. male with a hx of chronic diastolic heart failure, hypertension, aortic stenosis, OSA not on CPAP, and hyperlipidemia who is being seen 02/18/2022 for the evaluation of  CHF exacerbation at the request of Dr. Tamala Julian.   Assessment & Plan    # Acute on chronic diastolic HF:  -Admitted with acute on chronic diastolic CHF -I&Os remain incomplete -BNP 641 earlier this week -Lungs are clear with only trace LE edema -BMET pending this am -he was on Lasix 40mg  daily at home -if SCr stable then restart home dose of Lasix and Farxiga  # Resistant HTN:  # Hypertensive urgency: -BP remains elevated today at 173/131mmHg -ARB has been on hold secondary to chronic CKD but creatinine  improving and restarted yesterday -Continue carvedilol 25, Olmesartan 40mg  daily  -increase Hydralazine to 75mg  TID  # Severe aortic stenosis:  -Initially planned for AVR however now getting TAVR evaluation due to multiple comorbidities that arouse.   -Mean gradient 30 mmHg.  DI 0.28.   # PAD:  Angioplasty of the L tibioperoneal trunk and L peroneal artery on 10/2021.   -Continue aspirin and statin.   -Known SMA stenosis.    # Obstructive CAD:  -90% D2 and distal LAD lesions.   -He was going to get CABG but not a good candidate at this time.   -continue ASA 81mg  daily, Carvedilol 25mg  BID and statin therapy   # CVA:  -Prior lacunar infarcts and basal ganglia infarct seen on brain MRI.   -No acute findings.    -Needs better control blood pressure    # Afib with RVR:  -HR well controlled in afib today -continue Carvedilol 25mg  BID -change Cardizem to CD 240mg  daily -Continue Eliquis 5 mg twice daily continue Eliquis.     # Bacteremia:  # R knee septic arthritis:  Presented with N/V.  Completed IV antibiotics and PICC removed and 11/15 antibiotics were completed. Subjective fever and hasn't felt well since that time.  Possible pneumonia as well.  Per IM and ID.     I have spent a total of 35 minutes with patient reviewing 2D echo, telemetry, EKGs, labs and examining patient as well as establishing an assessment and plan that was discussed with the patient.  > 50% of time was  spent in direct patient care.    For questions or updates, please contact [place] Please consult  [email or web address] for contact info under        Signed, Fransico Him, MD  02/20/2022, 8:04 AM

## 2022-02-21 ENCOUNTER — Telehealth: Payer: Self-pay

## 2022-02-21 NOTE — Patient Outreach (Signed)
  Care Coordination TOC Note Transition Care Management Unsuccessful Follow-up Telephone Call  Date of discharge and from where:  02/20/22-[facility]   Attempts:  1st Attempt  Reason for unsuccessful TCM follow-up call:  No answer/busy   Wasif Simonich, RN,BSN,CCM [facility] Care Management Telephonic Care Management Coordinator Direct Phone: [phone] Toll Free: [phone] Fax: [phone]   

## 2022-02-21 NOTE — Patient Outreach (Signed)
  Care Coordination TOC Note Transition Care Management Follow-up Telephone Call Date of discharge and from where: 02/20/22-[facility] Dx: hypertensive emergency, A-fiib w/ RVR How have you been since you were released from the hospital? Incoming call form patient returning RN CM call. He voices that he is still in the bed but doing okay this morning. Denies any acute issues or concerns at present. He has not checked his BP yet since returning home but endorses that he does have BP monitor in the home and knows how to check BP and when to alert MD of abnormal readings.  Any questions or concerns? No  Items Reviewed: Did the pt receive and understand the discharge instructions provided? Yes  Medications obtained and verified?  Patient still in bed-unable to review meds-denies any issues with meds Other? Yes  Any new allergies since your discharge? No  Dietary orders reviewed? Yes Do you have support at home? Yes   Home Care and Equipment/Supplies: Were home health services ordered? not applicable If so, what is the name of the agency? N/A  Has the agency set up a time to come to the patient's home? not applicable Were any new equipment or medical supplies ordered?  No What is the name of the medical supply agency? N/A Were you able to get the supplies/equipment? not applicable Do you have any questions related to the use of the equipment or supplies? No  Functional Questionnaire: (I = Independent and D = Dependent) ADLs: I  Bathing/Dressing- I  Meal Prep- I  Eating- I  Maintaining continence- I  Transferring/Ambulation- I  Managing Meds- I  Follow up appointments reviewed:  PCP Hospital f/u appt confirmed? No  Patient just discharged yesterday-over the weekend. Will call office today to make an appt. Specialist Hospital f/u appt confirmed?  N/A . Are transportation arrangements needed? No  If their condition worsens, is the pt aware to call PCP or go to the Emergency  Dept.? Yes Was the patient provided with contact information for the PCP's office or ED? Yes Was to pt encouraged to call back with questions or concerns? Yes  SDOH assessments and interventions completed:   Yes  Care Coordination Interventions Activated:  Yes   Care Coordination Interventions:  Education provided    Encounter Outcome:  Pt. Visit Completed    Antionette Fairy, RN,BSN,CCM [facility] Care Management Telephonic Care Management Coordinator Direct Phone: [phone] Toll Free: [phone] Fax: [phone]

## 2022-02-22 ENCOUNTER — Inpatient Hospital Stay: Payer: Medicare Other | Admitting: Internal Medicine

## 2022-02-23 ENCOUNTER — Telehealth: Payer: Self-pay

## 2022-02-23 LAB — CULTURE, BLOOD (ROUTINE X 2)
Culture: NO GROWTH
Culture: NO GROWTH
Special Requests: ADEQUATE
Special Requests: ADEQUATE

## 2022-02-23 NOTE — Telephone Encounter (Signed)
Called patient unable to make contact or leave vm   Dr Delford Field want appt with him In 2 weeks

## 2022-02-28 ENCOUNTER — Other Ambulatory Visit: Payer: Self-pay | Admitting: Pharmacist

## 2022-02-28 MED ORDER — GABAPENTIN 400 MG PO CAPS: 800.0000 mg | ORAL_CAPSULE | Freq: Three times a day (TID) | ORAL | 0 refills | Status: DC

## 2022-03-01 ENCOUNTER — Other Ambulatory Visit: Payer: Self-pay | Admitting: Critical Care Medicine

## 2022-03-02 ENCOUNTER — Telehealth: Payer: Self-pay

## 2022-03-02 NOTE — Telephone Encounter (Signed)
Unable to refill per protocol, last refill by provider 02/28/22. Will refuse duplicate request.  Requested Prescriptions  Pending Prescriptions Disp Refills   gabapentin (NEURONTIN) 400 MG capsule 540 capsule 0    Sig: Take 2 capsules (800 mg total) by mouth 3 (three) times daily.     Neurology: Anticonvulsants - gabapentin Failed - 03/02/2022  9:28 AM      Failed - Cr in normal range and within 360 days    Creat  Date Value Ref Range Status  02/16/2022 1.75 (H) 0.70 - 1.35 mg/dL Final   Creatinine, Ser  Date Value Ref Range Status  02/20/2022 1.25 (H) 0.61 - 1.24 mg/dL Final         Passed - Completed PHQ-2 or PHQ-9 in the last 360 days      Passed - Valid encounter within last 12 months    Recent Outpatient Visits           3 months ago Primary hypertension   [facility] Storm Frisk, MD   3 months ago Primary hypertension   [facility] Storm Frisk, MD   5 months ago Primary hypertension   [facility] Storm Frisk, MD   6 months ago Severe aortic stenosis   [facility] Storm Frisk, MD   8 months ago Severe aortic stenosis   [facility] Storm Frisk, MD       Future Appointments             In 2 days Duanne Guess, MD [facility]   In 6 days Little Ishikawa, MD [facility] [place]   In 1 month Delford Field Charlcie Cradle, MD [facility]

## 2022-03-02 NOTE — Telephone Encounter (Deleted)
The spouse states that [facility] needs the authorization stating it is ok to fill this prescription early as he has lost his medicine someone. It has already been called in but the pharmacist states she needs to make sure the provider it ok with it being early. Please assist patient further

## 2022-03-02 NOTE — Telephone Encounter (Signed)
[employer] [employer] called and seeking clarification of Gabapentin reorder.
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
## 2022-03-03 NOTE — Telephone Encounter (Signed)
I agree with PharmD recommendations

## 2022-03-04 ENCOUNTER — Telehealth: Payer: Self-pay

## 2022-03-04 ENCOUNTER — Encounter (HOSPITAL_BASED_OUTPATIENT_CLINIC_OR_DEPARTMENT_OTHER): Payer: Medicare Other | Attending: General Surgery | Admitting: General Surgery

## 2022-03-04 NOTE — Telephone Encounter (Signed)
I tried to contact patient to schedule a hospital follow up appointment with PCP. I called [phone] and the voicemail was not set up.  I also called [phone] and the voicemail was full.
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
## 2022-03-08 ENCOUNTER — Ambulatory Visit: Payer: Medicare Other | Attending: Cardiology | Admitting: Cardiology

## 2022-03-08 ENCOUNTER — Encounter: Payer: Self-pay | Admitting: Cardiology

## 2022-03-08 VITALS — BP 105/71 | HR 84 | Ht 69.0 in | Wt 194.0 lb

## 2022-03-08 DIAGNOSIS — I1A0 Resistant hypertension: Secondary | ICD-10-CM

## 2022-03-08 DIAGNOSIS — I739 Peripheral vascular disease, unspecified: Secondary | ICD-10-CM

## 2022-03-08 DIAGNOSIS — R9431 Abnormal electrocardiogram [ECG] [EKG]: Secondary | ICD-10-CM | POA: Diagnosis not present

## 2022-03-08 DIAGNOSIS — I5032 Chronic diastolic (congestive) heart failure: Secondary | ICD-10-CM | POA: Diagnosis not present

## 2022-03-08 DIAGNOSIS — I350 Nonrheumatic aortic (valve) stenosis: Secondary | ICD-10-CM

## 2022-03-08 DIAGNOSIS — E785 Hyperlipidemia, unspecified: Secondary | ICD-10-CM

## 2022-03-08 NOTE — Patient Instructions (Signed)
Medication Instructions:  Your physician recommends that you continue on your current medications as directed. Please refer to the Current Medication list given to you today.  *If you need a refill on your cardiac medications before your next appointment, please call your pharmacy*   Lab Work: BMET, CBC today  If you have labs (blood work) drawn today and your tests are completely normal, you will receive your results only by: MyChart Message (if you have MyChart) OR A paper copy in the mail If you have any lab test that is abnormal or we need to change your treatment, we will call you to review the results.   Follow-Up: At [facility], you and your health needs are our priority.  As part of our continuing mission to provide you with exceptional heart care, we have created designated Provider Care Teams.  These Care Teams include your primary Cardiologist (physician) and Advanced Practice Providers (APPs -  Physician Assistants and Nurse Practitioners) who all work together to provide you with the care you need, when you need it.  We recommend signing up for the patient portal called "MyChart".  Sign up information is provided on this After Visit Summary.  MyChart is used to connect with patients for Virtual Visits (Telemedicine).  Patients are able to view lab/test results, encounter notes, upcoming appointments, etc.  Non-urgent messages can be sent to your provider as well.   To learn more about what you can do with MyChart, go to [email or web address].    Your next appointment:   3 month(s)  The format for your next appointment:   In Person  Provider:   Little Ishikawa, MD

## 2022-03-09 LAB — CBC
Hematocrit: 30.9 % — ABNORMAL LOW (ref 37.5–51.0)
Hemoglobin: 9.5 g/dL — ABNORMAL LOW (ref 13.0–17.7)
MCH: 23.1 pg — ABNORMAL LOW (ref 26.6–33.0)
MCHC: 30.7 g/dL — ABNORMAL LOW (ref 31.5–35.7)
MCV: 75 fL — ABNORMAL LOW (ref 79–97)
Platelets: 195 10*3/uL (ref 150–450)
RBC: 4.11 x10E6/uL — ABNORMAL LOW (ref 4.14–5.80)
RDW: 17.9 % — ABNORMAL HIGH (ref 11.6–15.4)
WBC: 3.4 10*3/uL (ref 3.4–10.8)

## 2022-03-09 LAB — BASIC METABOLIC PANEL
BUN/Creatinine Ratio: 12 (ref 10–24)
BUN: 15 mg/dL (ref 8–27)
CO2: 20 mmol/L (ref 20–29)
Calcium: 9.2 mg/dL (ref 8.6–10.2)
Chloride: 106 mmol/L (ref 96–106)
Creatinine, Ser: 1.25 mg/dL (ref 0.76–1.27)
Glucose: 104 mg/dL — ABNORMAL HIGH (ref 70–99)
Potassium: 4.4 mmol/L (ref 3.5–5.2)
Sodium: 141 mmol/L (ref 134–144)
eGFR: 65 mL/min/{1.73_m2} (ref >59)

## 2022-03-10 ENCOUNTER — Other Ambulatory Visit (HOSPITAL_COMMUNITY): Payer: Medicare Other

## 2022-03-10 ENCOUNTER — Encounter: Payer: Self-pay | Admitting: *Deleted

## 2022-03-16 ENCOUNTER — Ambulatory Visit: Payer: Medicare Other | Admitting: Surgery

## 2022-03-17 ENCOUNTER — Telehealth: Payer: Self-pay

## 2022-03-17 NOTE — Telephone Encounter (Signed)
I was able to reach the patient and offer him an appointment with Dr Delford Field next wee, either 12/19 or 12/20.  He already has an appointment scheduled for 04/05/2022 and preferred to keep that appointment but will call the clinic if he has any concerns before then and needs to be seen.

## 2022-03-24 ENCOUNTER — Observation Stay (HOSPITAL_COMMUNITY)
Admission: EM | Admit: 2022-03-24 | Discharge: 2022-03-27 | Disposition: A | Discharge status: Home / Self Care | Payer: Medicare Other | Attending: Family Medicine | Admitting: Family Medicine

## 2022-03-24 ENCOUNTER — Emergency Department (HOSPITAL_COMMUNITY): Payer: Medicare Other

## 2022-03-24 DIAGNOSIS — Z8673 Personal history of transient ischemic attack (TIA), and cerebral infarction without residual deficits: Secondary | ICD-10-CM

## 2022-03-24 DIAGNOSIS — Z7901 Long term (current) use of anticoagulants: Secondary | ICD-10-CM | POA: Diagnosis not present

## 2022-03-24 DIAGNOSIS — M25561 Pain in right knee: Secondary | ICD-10-CM | POA: Insufficient documentation

## 2022-03-24 DIAGNOSIS — I130 Hypertensive heart and chronic kidney disease with heart failure and stage 1 through stage 4 chronic kidney disease, or unspecified chronic kidney disease: Secondary | ICD-10-CM | POA: Diagnosis not present

## 2022-03-24 DIAGNOSIS — I70299 Other atherosclerosis of native arteries of extremities, unspecified extremity: Secondary | ICD-10-CM | POA: Diagnosis present

## 2022-03-24 DIAGNOSIS — R55 Syncope and collapse: Secondary | ICD-10-CM | POA: Diagnosis not present

## 2022-03-24 DIAGNOSIS — R400 Somnolence: Secondary | ICD-10-CM

## 2022-03-24 DIAGNOSIS — Z7984 Long term (current) use of oral hypoglycemic drugs: Secondary | ICD-10-CM | POA: Diagnosis not present

## 2022-03-24 DIAGNOSIS — I5032 Chronic diastolic (congestive) heart failure: Secondary | ICD-10-CM | POA: Insufficient documentation

## 2022-03-24 DIAGNOSIS — I160 Hypertensive urgency: Secondary | ICD-10-CM | POA: Diagnosis present

## 2022-03-24 DIAGNOSIS — L97909 Non-pressure chronic ulcer of unspecified part of unspecified lower leg with unspecified severity: Secondary | ICD-10-CM | POA: Diagnosis present

## 2022-03-24 DIAGNOSIS — I499 Cardiac arrhythmia, unspecified: Secondary | ICD-10-CM | POA: Diagnosis not present

## 2022-03-24 DIAGNOSIS — Z79899 Other long term (current) drug therapy: Secondary | ICD-10-CM | POA: Insufficient documentation

## 2022-03-24 DIAGNOSIS — Z7982 Long term (current) use of aspirin: Secondary | ICD-10-CM | POA: Diagnosis not present

## 2022-03-24 DIAGNOSIS — M25461 Effusion, right knee: Secondary | ICD-10-CM | POA: Diagnosis present

## 2022-03-24 DIAGNOSIS — M009 Pyogenic arthritis, unspecified: Secondary | ICD-10-CM | POA: Diagnosis present

## 2022-03-24 DIAGNOSIS — Z743 Need for continuous supervision: Secondary | ICD-10-CM | POA: Diagnosis not present

## 2022-03-24 DIAGNOSIS — Z91040 Latex allergy status: Secondary | ICD-10-CM | POA: Insufficient documentation

## 2022-03-24 DIAGNOSIS — I10 Essential (primary) hypertension: Secondary | ICD-10-CM | POA: Diagnosis present

## 2022-03-24 DIAGNOSIS — I2510 Atherosclerotic heart disease of native coronary artery without angina pectoris: Secondary | ICD-10-CM | POA: Diagnosis not present

## 2022-03-24 DIAGNOSIS — I739 Peripheral vascular disease, unspecified: Secondary | ICD-10-CM | POA: Diagnosis present

## 2022-03-24 DIAGNOSIS — I4819 Other persistent atrial fibrillation: Secondary | ICD-10-CM | POA: Diagnosis not present

## 2022-03-24 DIAGNOSIS — I5030 Unspecified diastolic (congestive) heart failure: Secondary | ICD-10-CM | POA: Diagnosis present

## 2022-03-24 DIAGNOSIS — R404 Transient alteration of awareness: Secondary | ICD-10-CM | POA: Diagnosis not present

## 2022-03-24 DIAGNOSIS — R109 Unspecified abdominal pain: Secondary | ICD-10-CM | POA: Diagnosis not present

## 2022-03-24 DIAGNOSIS — N1831 Chronic kidney disease, stage 3a: Secondary | ICD-10-CM | POA: Diagnosis not present

## 2022-03-24 DIAGNOSIS — I350 Nonrheumatic aortic (valve) stenosis: Secondary | ICD-10-CM

## 2022-03-24 DIAGNOSIS — R531 Weakness: Secondary | ICD-10-CM | POA: Diagnosis not present

## 2022-03-24 DIAGNOSIS — G4733 Obstructive sleep apnea (adult) (pediatric): Secondary | ICD-10-CM | POA: Diagnosis present

## 2022-03-24 LAB — CBC WITH DIFFERENTIAL/PLATELET
Abs Immature Granulocytes: 0.02 10*3/uL (ref 0.00–0.07)
Basophils Absolute: 0 10*3/uL (ref 0.0–0.1)
Basophils Relative: 1 %
Eosinophils Absolute: 0.1 10*3/uL (ref 0.0–0.5)
Eosinophils Relative: 2 %
HCT: 34.1 % — ABNORMAL LOW (ref 39.0–52.0)
Hemoglobin: 10.4 g/dL — ABNORMAL LOW (ref 13.0–17.0)
Immature Granulocytes: 0 %
Lymphocytes Relative: 28 %
Lymphs Abs: 1.5 10*3/uL (ref 0.7–4.0)
MCH: 22.9 pg — ABNORMAL LOW (ref 26.0–34.0)
MCHC: 30.5 g/dL (ref 30.0–36.0)
MCV: 74.9 fL — ABNORMAL LOW (ref 80.0–100.0)
Monocytes Absolute: 0.6 10*3/uL (ref 0.1–1.0)
Monocytes Relative: 11 %
Neutro Abs: 3.2 10*3/uL (ref 1.7–7.7)
Neutrophils Relative %: 58 %
Platelets: 147 10*3/uL — ABNORMAL LOW (ref 150–400)
RBC: 4.55 MIL/uL (ref 4.22–5.81)
RDW: 17.2 % — ABNORMAL HIGH (ref 11.5–15.5)
WBC: 5.5 10*3/uL (ref 4.0–10.5)
nRBC: 0 % (ref 0.0–0.2)

## 2022-03-24 LAB — COMPREHENSIVE METABOLIC PANEL
ALT: 10 U/L (ref 0–44)
AST: 20 U/L (ref 15–41)
Albumin: 2.5 g/dL — ABNORMAL LOW (ref 3.5–5.0)
Alkaline Phosphatase: 63 U/L (ref 38–126)
Anion gap: 10 (ref 5–15)
BUN: 19 mg/dL (ref 8–23)
CO2: 16 mmol/L — ABNORMAL LOW (ref 22–32)
Calcium: 7.5 mg/dL — ABNORMAL LOW (ref 8.9–10.3)
Chloride: 114 mmol/L — ABNORMAL HIGH (ref 98–111)
Creatinine, Ser: 1.29 mg/dL — ABNORMAL HIGH (ref 0.61–1.24)
GFR, Estimated: >60 mL/min (ref >60)
Glucose, Bld: 94 mg/dL (ref 70–99)
Potassium: 4.3 mmol/L (ref 3.5–5.1)
Sodium: 140 mmol/L (ref 135–145)
Total Bilirubin: 0.4 mg/dL (ref 0.3–1.2)
Total Protein: 5.7 g/dL — ABNORMAL LOW (ref 6.5–8.1)

## 2022-03-24 LAB — MAGNESIUM: Magnesium: 1.4 mg/dL — ABNORMAL LOW (ref 1.7–2.4)

## 2022-03-24 LAB — ETHANOL: Alcohol, Ethyl (B): <10 mg/dL (ref <10)

## 2022-03-24 LAB — CBG MONITORING, ED: Glucose-Capillary: 108 mg/dL — ABNORMAL HIGH (ref 70–99)

## 2022-03-24 MED ORDER — PROCHLORPERAZINE EDISYLATE 10 MG/2ML IJ SOLN: 10.0000 mg | Freq: Four times a day (QID) | INTRAMUSCULAR | Status: DC | PRN

## 2022-03-24 MED ORDER — APIXABAN 5 MG PO TABS
5.0000 mg | ORAL_TABLET | Freq: Two times a day (BID) | ORAL | Status: DC
  Administered 2022-03-25 – 2022-03-27 (×5): 5 mg via ORAL
  Filled 2022-03-24 (×5): qty 1

## 2022-03-24 MED ORDER — SODIUM CHLORIDE 0.9% FLUSH
3.0000 mL | Freq: Two times a day (BID) | INTRAVENOUS | Status: DC
  Administered 2022-03-25 – 2022-03-27 (×6): 3 mL via INTRAVENOUS

## 2022-03-24 MED ORDER — SENNOSIDES-DOCUSATE SODIUM 8.6-50 MG PO TABS: 1.0000 | ORAL_TABLET | Freq: Every evening | ORAL | Status: DC | PRN

## 2022-03-24 MED ORDER — MAGNESIUM SULFATE 2 GM/50ML IV SOLN
2.0000 g | Freq: Once | INTRAVENOUS | Status: AC
  Administered 2022-03-24: 2 g via INTRAVENOUS
  Filled 2022-03-24: qty 50

## 2022-03-24 MED ORDER — ASPIRIN 81 MG PO TBEC
81.0000 mg | DELAYED_RELEASE_TABLET | Freq: Every day | ORAL | Status: DC
  Administered 2022-03-26 – 2022-03-27 (×2): 81 mg via ORAL
  Filled 2022-03-24 (×2): qty 1

## 2022-03-24 MED ORDER — ACETAMINOPHEN 650 MG RE SUPP: 650.0000 mg | Freq: Four times a day (QID) | RECTAL | Status: DC | PRN

## 2022-03-24 MED ORDER — ATORVASTATIN CALCIUM 10 MG PO TABS
20.0000 mg | ORAL_TABLET | Freq: Every day | ORAL | Status: DC
  Administered 2022-03-25 – 2022-03-27 (×3): 20 mg via ORAL
  Filled 2022-03-24 (×3): qty 2

## 2022-03-24 MED ORDER — ACETAMINOPHEN 325 MG PO TABS
650.0000 mg | ORAL_TABLET | Freq: Four times a day (QID) | ORAL | Status: DC | PRN
  Administered 2022-03-27: 650 mg via ORAL
  Filled 2022-03-24 (×2): qty 2

## 2022-03-24 NOTE — ED Provider Notes (Signed)
[facility] DEPART[facility] Provider Note   CSN: [id number] Arrival date & time: 03/24/22  1836     History  Chief Complaint  Patient presents with   Hypotension   Loss of Consciousness    [name] is a 61 y.o. male.  Patient is a 61 year old male with a past medical history of CHF, hypertension, aortic stenosis, A-fib on Eliquis, recent treatment for sepsis and bacteremia secondary to right knee septic joint to the emergency department with syncope.  Per EMS, the patient was at the nursing home visiting a family member and upon standing had a syncopal episode.  They state that since then he has been in and out of consciousness.  The patient is drowsy and denies any pain but is unable to provide additional history due to his mental status.  He was able to tell me that he recently had surgery on his right knee and denies being on any opioid pain medication.  The history is provided by the EMS personnel. The history is limited by the condition of the patient.  Loss of Consciousness      Home Medications Prior to Admission medications   Medication Sig Start Date End Date Taking? Authorizing Provider  acetaminophen (TYLENOL) 500 MG tablet Take 500 mg by mouth 3 (three) times daily as needed for moderate pain.    [provider]  allopurinol (ZYLOPRIM) 100 MG tablet Take 1 tablet (100 mg total) by mouth daily. 01/26/22 03/27/22  Elodia Florence., MD  apixaban (ELIQUIS) 5 MG TABS tablet Take 1 tablet (5 mg total) by mouth 2 (two) times daily. 11/08/21   [name], MD  aspirin EC 81 MG tablet Take 81 mg by mouth daily. Swallow whole.    [provider]  atorvastatin (LIPITOR) 20 MG tablet Take 1 tablet (20 mg total) by mouth daily. Resume this after your antibiotics are complete Patient taking differently: Take 20 mg by mouth daily. 02/18/22   Elodia Florence., MD  carvedilol (COREG) 25 MG tablet Take 1 tablet (25 mg total) by  mouth 2 (two) times daily with a meal. 02/20/22   Patrecia Pour, MD  cloNIDine (CATAPRES) 0.1 MG tablet Take 1 tablet (0.1 mg total) by mouth 3 (three) times daily. 01/25/22 03/26/22  Elodia Florence., MD  diltiazem (CARDIZEM CD) 240 MG 24 hr capsule Take 1 capsule (240 mg total) by mouth daily. 02/21/22   Patrecia Pour, MD  FARXIGA 10 MG TABS tablet Take 10 mg by mouth daily. 01/08/22   [provider]  ferrous sulfate 325 (65 FE) MG EC tablet Take 1 tablet (325 mg total) by mouth every other day. 01/06/22 01/06/23  Danford, Suann Larry, MD  furosemide (LASIX) 40 MG tablet Take 1 tablet (40 mg total) by mouth daily. 02/20/22   Patrecia Pour, MD  gabapentin (NEURONTIN) 400 MG capsule Take 2 capsules (800 mg total) by mouth 3 (three) times daily. 02/28/22   [name], MD  hydrALAZINE (APRESOLINE) 25 MG tablet Take 3 tablets (75 mg total) by mouth every 8 (eight) hours. 02/20/22 03/22/22  Patrecia Pour, MD  magnesium oxide (MAG-OX) 400 MG tablet Take 1 tablet (400 mg total) by mouth every other day. 11/09/21   Donato Heinz, MD  olmesartan (BENICAR) 40 MG tablet Take 40 mg by mouth daily.    [provider]  omeprazole (PRILOSEC) 40 MG capsule Take 1 capsule (40 mg total) by mouth daily.  12/07/21   Donato Heinz, MD  Potassium Chloride ER 20 MEQ TBCR Take 1 tablet by mouth daily. 02/21/22   [provider]  potassium chloride SA (KLOR-CON M) 20 MEQ tablet Take 1 tablet (20 mEq total) by mouth daily as needed. When you take your lasix Patient taking differently: Take 20 mEq by mouth every other day. 01/25/22   Elodia Florence., MD      Allergies    Adhesive [tape] and Latex    Review of Systems   Review of Systems  Cardiovascular:  Positive for syncope.    Physical Exam Updated Vital Signs BP 94/70   Pulse (!) 57   Temp 97.9 F (36.6 C) (Oral)   Resp 16   SpO2 98%  Physical Exam Vitals and nursing note reviewed.   Constitutional:      General: He is not in acute distress.    Comments: Drowsy but arousable to verbal stimulation, falls asleep easily during exam.  HENT:     Head: Normocephalic and atraumatic.     Nose: Nose normal.     Mouth/Throat:     Mouth: Mucous membranes are moist.     Pharynx: Oropharynx is clear.  Eyes:     Extraocular Movements: Extraocular movements intact.     Conjunctiva/sclera: Conjunctivae normal.     Comments: Pupils 2 mm equal bilaterally   Cardiovascular:     Rate and Rhythm: Bradycardia present. Rhythm irregular.     Pulses: Normal pulses.     Heart sounds: Normal heart sounds.  Pulmonary:     Effort: Pulmonary effort is normal.     Breath sounds: Normal breath sounds.  Abdominal:     General: Abdomen is flat.     Palpations: Abdomen is soft.     Tenderness: There is no abdominal tenderness.  Musculoskeletal:        General: Normal range of motion.     Cervical back: Normal range of motion and neck supple.  Skin:    General: Skin is dry.     Comments: Skin cool  Neurological:     General: No focal deficit present.     Mental Status: He is oriented to person, place, and time.     Cranial Nerves: No cranial nerve deficit.     Sensory: No sensory deficit.     Motor: No weakness.     ED Results / Procedures / Treatments   Labs (all labs ordered are listed, but only abnormal results are displayed) Labs Reviewed  COMPREHENSIVE METABOLIC PANEL - Abnormal; Notable for the following components:      Result Value   Chloride 114 (*)    CO2 16 (*)    Creatinine, Ser 1.29 (*)    Calcium 7.5 (*)    Total Protein 5.7 (*)    Albumin 2.5 (*)    All other components within normal limits  CBC WITH DIFFERENTIAL/PLATELET - Abnormal; Notable for the following components:   Hemoglobin 10.4 (*)    HCT 34.1 (*)    MCV 74.9 (*)    MCH 22.9 (*)    RDW 17.2 (*)    Platelets 147 (*)    All other components within normal limits  MAGNESIUM - Abnormal; Notable for  the following components:   Magnesium 1.4 (*)    All other components within normal limits  CBG MONITORING, ED - Abnormal; Notable for the following components:   Glucose-Capillary 108 (*)    All other components within normal  limits  ETHANOL  RAPID URINE DRUG SCREEN, HOSP PERFORMED  URINALYSIS, ROUTINE W REFLEX MICROSCOPIC    EKG EKG Interpretation  Date/Time:  Thursday March 24 2022 18:53:17 EST Ventricular Rate:  60 PR Interval:    QRS Duration: 95 QT Interval:  512 QTC Calculation: 512 R Axis:   26 Text Interpretation: Atrial fibrillation Anterior infarct, old Prolonged QT interval Rate decreased otherwise no significant change from prior EKG Confirmed by Elayne Snare ([id number]) on 03/24/2022 7:34:02 PM  Radiology CT Head Wo Contrast  Result Date: 03/24/2022 CLINICAL DATA:  Mental status change, unknown cause EXAM: CT HEAD WITHOUT CONTRAST TECHNIQUE: Contiguous axial images were obtained from the base of the skull through the vertex without intravenous contrast. RADIATION DOSE REDUCTION: This exam was performed according to the departmental dose-optimization program which includes automated exposure control, adjustment of the mA and/or kV according to patient size and/or use of iterative reconstruction technique. COMPARISON:  CT head 02/17/2022 CT head 01/01/2022. FINDINGS: Brain: Patchy and confluent areas of decreased attenuation are noted throughout the deep and periventricular white matter of the cerebral hemispheres bilaterally, compatible with chronic microvascular ischemic disease. No evidence of large-territorial acute infarction. No parenchymal hemorrhage. No mass lesion. No extra-axial collection. No mass effect or midline shift. No hydrocephalus. Basilar cisterns are patent. Vascular: No hyperdense vessel. Atherosclerotic calcifications are present within the cavernous internal carotid and vertebral arteries. Skull: No acute fracture or focal lesion. Sinuses/Orbits:  Paranasal sinuses and mastoid air cells are clear. The orbits are unremarkable. Other: None. IMPRESSION: No acute intracranial abnormality. Electronically Signed   By: Tish Frederickson M.D.   On: 03/24/2022 20:20   DG Chest 1 View  Result Date: 03/24/2022 CLINICAL DATA:  syncope EXAM: CHEST  1 VIEW COMPARISON:  Chest x-[facility] 02/17/2022 on FINDINGS: Enlarged cardiac silhouette. The heart and mediastinal contours are unchanged. Prominent hilar vasculature. Atherosclerotic plaque. No focal consolidation. No pulmonary edema. No pleural effusion. No pneumothorax. No acute osseous abnormality. IMPRESSION: 1. Central venous congestion with no frank pulmonary edema. 2.  Aortic Atherosclerosis (ICD10-I70.0). Electronically Signed   By: Tish Frederickson M.D.   On: 03/24/2022 19:26    Procedures Procedures    Medications Ordered in ED Medications  magnesium sulfate IVPB 2 g 50 mL (2 g Intravenous New Bag/Given 03/24/22 2126)    ED Course/ Medical Decision Making/ A&P Clinical Course as of 03/24/22 2233  Thu Mar 24, 2022  2020 Hgb and Cr at baseline, mildly low bicarb. Mag mildly low and will be repleted. [VK]  2056 CT head without acute abnormalities.  Urine is pending at this time.  On reassessment, the patient is sleepy but arousable to verbal stimulation and still falling asleep during conversations.  The patient was recommended admission due to his high risk syncope but would not like to be admitted.  The patient would like to leave AGAINST MEDICAL ADVICE, however he is to drowsy to ensure that he understands the risks of leaving AMA.  The patient will be reassessed in about an hour to evaluate for improvement of his mental status to determine his disposition. [VK]  2231 Upon reassessment, the patient continues to be asleep in bed, resting comfortably.  I was able to wake him with gentle noxious stimuli.  The patient was again recommended admission and he is now agreeable. [VK]    Clinical Course User  Index [VK] Rexford Maus, DO  Medical Decision Making This patient presents to the ED with chief complaint(s) of syncope with pertinent past medical history of A-fib on Eliquis, CHF, hypertension, aortic stenosis, recent hospitalization for bacteremia from right knee septic joint which further complicates the presenting complaint. The complaint involves an extensive differential diagnosis and also carries with it a high risk of complications and morbidity.    The differential diagnosis includes ACS, arrhythmia, electrolyte abnormality, aortic stenosis induced syncope, ICH, mass effect, CVA, anemia, infection, intoxication  Additional history obtained: Additional history obtained from EMS  Records reviewed previous admission documents and outpatient cardiology records  ED Course and Reassessment: Upon patient's arrival to the emergency department he was drowsy but arousable to verbal stimulation, protecting his airway.  He is following commands in all 4 extremities and has no focal neurologic deficits.  Accu-Chek on arrival was within normal range and EKG showed atrial fibrillation and a rate in the 50s.  The patient will have labs performed to evaluate for cause of his altered mental status and syncope in addition to head CT and he will be closely reassessed.  Independent labs interpretation:  The following labs were independently interpreted: at baseline  Independent visualization of imaging: - I independently visualized the following imaging with scope of interpretation limited to determining acute life threatening conditions related to emergency care: CXR, CTH, which revealed mild increased vascular congestion on CXR, CTH negative  Consultation: - Consulted or discussed management/test interpretation w/ external professional: Hospitalist  Consideration for admission or further workup: patient requires admission for his high risk syncope and AMS Social  Determinants of health: N/A    Amount and/or Complexity of Data Reviewed Labs: ordered. Radiology: ordered.  Risk Prescription drug management. Decision regarding hospitalization.          Final Clinical Impression(s) / ED Diagnoses Final diagnoses:  Syncope, unspecified syncope type  Somnolence    Rx / DC Orders ED Discharge Orders     None         Kemper Durie, DO 03/24/22 2233

## 2022-03-24 NOTE — Assessment & Plan Note (Addendum)
Returning in atrial fibrillation.  Mildly bradycardic on arrival. -Coreg and diltiazem were on hold  -Continue Eliquis -Restarting Coreg today

## 2022-03-24 NOTE — Assessment & Plan Note (Addendum)
Renal function stable, GFR improved from baseline. Lab Results  Component Value Date   CREATININE 1.34 (H) 03/27/2022   CREATININE 1.44 (H) 03/26/2022   CREATININE 0.89 03/25/2022

## 2022-03-24 NOTE — Assessment & Plan Note (Addendum)
Sleep study shows severe OSA.  Not sure if he has received CPAP/BiPAP yet.

## 2022-03-24 NOTE — Hospital Course (Addendum)
61 y.o. male with medical history significant for HFpEF (EF 55%), CAD, persistent A-fib/flutter on Eliquis, severe aortic stenosis, history of CVA, PAD, CKD stage IIIa, HTN, HLD, OSA, recent strep bacteremia and septic arthritis of the right knee earlier this year who is admitted for evaluation of syncope.

## 2022-03-24 NOTE — Assessment & Plan Note (Addendum)
IV supplement given.   -Monitoring closely

## 2022-03-24 NOTE — ED Triage Notes (Signed)
Pt BIB EMS from visiting family member at nursing home. Took a nap and upon standing had a syncopal episode with LOC. Not complaining of any pain or weakness. Hx of 3 strokes in the past 4 months. CKD and CHF. LKW 1630.

## 2022-03-24 NOTE — Assessment & Plan Note (Addendum)
-  Continue denies any chest pain -No changes in EKG -Continue aspirin and atorvastatin.
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
## 2022-03-24 NOTE — Assessment & Plan Note (Addendum)
-   Currently stable - Euvolemic to mildly hypovolemic on admission.  Last EF 55% on 02/19/2022. -was Holding Lasix, Coreg, Marcelline Deist given syncopal event with borderline hypotension and bradycardia Improved bradycardia and hypotension --restarting Coreg and  [place] today 03/27/2022

## 2022-03-24 NOTE — Assessment & Plan Note (Signed)
Continue aspirin, Eliquis, statin.

## 2022-03-24 NOTE — Assessment & Plan Note (Addendum)
History of resistant hypertension on multiple antihypertensives.  These are on hold given syncopal event with borderline hypotension.   -BP has improved, restarting  beta-blockers

## 2022-03-24 NOTE — Assessment & Plan Note (Signed)
Admitted after syncopal event without prodrome.  This occurred after he stood up.  Differential is broad including orthostasis, hypotension, cardiogenic given underlying arrhythmia with hypomagnesemia, or secondary to known severe aortic stenosis. -Holding antihypertensives for now -Check orthostatic vitals -Keep on telemetry -Magnesium supplemented -TTE 02/19/2022 showed EF 55%, severe concentric LVH, severe aortic stenosis -Check UA, UDS -PT/OT eval

## 2022-03-24 NOTE — Assessment & Plan Note (Signed)
Known severe aortic stenosis.  He has been evaluated by CT surgery and cardiology to determine whether to proceed with surgical AVR or TAVR.

## 2022-03-25 ENCOUNTER — Encounter (HOSPITAL_COMMUNITY): Payer: Self-pay | Admitting: Internal Medicine

## 2022-03-25 ENCOUNTER — Other Ambulatory Visit: Payer: Self-pay

## 2022-03-25 ENCOUNTER — Observation Stay (HOSPITAL_COMMUNITY): Payer: Medicare Other

## 2022-03-25 DIAGNOSIS — M25461 Effusion, right knee: Secondary | ICD-10-CM | POA: Diagnosis not present

## 2022-03-25 DIAGNOSIS — R55 Syncope and collapse: Secondary | ICD-10-CM | POA: Diagnosis not present

## 2022-03-25 DIAGNOSIS — M1711 Unilateral primary osteoarthritis, right knee: Secondary | ICD-10-CM | POA: Diagnosis not present

## 2022-03-25 LAB — CBC
HCT: 23.1 % — ABNORMAL LOW (ref 39.0–52.0)
Hemoglobin: 7.6 g/dL — ABNORMAL LOW (ref 13.0–17.0)
MCH: 24 pg — ABNORMAL LOW (ref 26.0–34.0)
MCHC: 32.9 g/dL (ref 30.0–36.0)
MCV: 72.9 fL — ABNORMAL LOW (ref 80.0–100.0)
Platelets: 109 10*3/uL — ABNORMAL LOW (ref 150–400)
RBC: 3.17 MIL/uL — ABNORMAL LOW (ref 4.22–5.81)
RDW: 17 % — ABNORMAL HIGH (ref 11.5–15.5)
WBC: 3.3 10*3/uL — ABNORMAL LOW (ref 4.0–10.5)
nRBC: 0 % (ref 0.0–0.2)

## 2022-03-25 LAB — BASIC METABOLIC PANEL
Anion gap: 7 (ref 5–15)
BUN: 15 mg/dL (ref 8–23)
CO2: 15 mmol/L — ABNORMAL LOW (ref 22–32)
Calcium: 6.1 mg/dL — CL (ref 8.9–10.3)
Chloride: 121 mmol/L — ABNORMAL HIGH (ref 98–111)
Creatinine, Ser: 0.89 mg/dL (ref 0.61–1.24)
GFR, Estimated: >60 mL/min (ref >60)
Glucose, Bld: 72 mg/dL (ref 70–99)
Potassium: 3.1 mmol/L — ABNORMAL LOW (ref 3.5–5.1)
Sodium: 143 mmol/L (ref 135–145)

## 2022-03-25 LAB — MAGNESIUM: Magnesium: 1.6 mg/dL — ABNORMAL LOW (ref 1.7–2.4)

## 2022-03-25 LAB — URINALYSIS, ROUTINE W REFLEX MICROSCOPIC
Bilirubin Urine: NEGATIVE
Glucose, UA: >=500 mg/dL — AB
Hgb urine dipstick: NEGATIVE
Ketones, ur: NEGATIVE mg/dL
Leukocytes,Ua: NEGATIVE
Nitrite: NEGATIVE
Protein, ur: 100 mg/dL — AB
Specific Gravity, Urine: 1.02 (ref 1.005–1.030)
pH: 5 (ref 5.0–8.0)

## 2022-03-25 LAB — HEMOGLOBIN AND HEMATOCRIT, BLOOD
HCT: 36.1 % — ABNORMAL LOW (ref 39.0–52.0)
Hemoglobin: 11.4 g/dL — ABNORMAL LOW (ref 13.0–17.0)

## 2022-03-25 LAB — RAPID URINE DRUG SCREEN, HOSP PERFORMED
Amphetamines: NOT DETECTED
Barbiturates: NOT DETECTED
Benzodiazepines: NOT DETECTED
Cocaine: NOT DETECTED
Opiates: NOT DETECTED
Tetrahydrocannabinol: POSITIVE — AB

## 2022-03-25 LAB — POC OCCULT BLOOD, ED: Fecal Occult Bld: NEGATIVE

## 2022-03-25 LAB — CBG MONITORING, ED: Glucose-Capillary: 97 mg/dL (ref 70–99)

## 2022-03-25 MED ORDER — POTASSIUM CHLORIDE CRYS ER 20 MEQ PO TBCR
40.0000 meq | EXTENDED_RELEASE_TABLET | Freq: Once | ORAL | Status: AC
  Administered 2022-03-25: 40 meq via ORAL
  Filled 2022-03-25: qty 2

## 2022-03-25 MED ORDER — MAGNESIUM SULFATE IN D5W 1-5 GM/100ML-% IV SOLN
1.0000 g | Freq: Once | INTRAVENOUS | Status: AC
  Administered 2022-03-25: 1 g via INTRAVENOUS
  Filled 2022-03-25: qty 100

## 2022-03-25 MED ORDER — CALCIUM GLUCONATE-NACL 1-0.675 GM/50ML-% IV SOLN
1.0000 g | Freq: Once | INTRAVENOUS | Status: AC
  Administered 2022-03-25: 1000 mg via INTRAVENOUS
  Filled 2022-03-25: qty 50

## 2022-03-25 MED ORDER — INFLUENZA VAC SPLIT QUAD 0.5 ML IM SUSY: 0.5000 mL | PREFILLED_SYRINGE | INTRAMUSCULAR | Status: DC

## 2022-03-25 NOTE — Progress Notes (Signed)
PROGRESS NOTE [name]  [id number] DOB: September 05, 1960 DOA: 03/24/2022 PCP: [name], MD   Brief Narrative/Hospital Course: 61 y.o. male with medical history significant for HFpEF (EF 55%), CAD, persistent A-fib/flutter on Eliquis, severe aortic stenosis, history of CVA, PAD, CKD stage IIIa, HTN, HLD, OSA, recent strep bacteremia and septic arthritis of the right knee earlier this year who is admitted for evaluation of syncope.  Recent admission in November for dizziness/hypertensive urgency, sepsis  from UTI A-fib with RVR acute to chronic diastolic CHF. He has history of bacteremia with right knee septic arthritis and had completed iv antibiotics and linezolid on 11/5 and PICC line was also removed on 11/15  Subjective: Seen and examined this morning wife is at the bedside Patient directs to his wife for most of the questions reports he is forgetful  overnight afebrile BP stable in 110s to 140s, on room air. Labs reviewed this morning potassium 3.1, mag 1.6 calcium 6.1.  H&H was 7.6 on recheck at baseline 11.4 g. He has mild oozing from his right knee from fall has a intact stitc from recent knee surgery he states  Assessment and Plan: Principal Problem:   Syncope Active Problems:   Persistent atrial fibrillation (HCC)   (HFpEF) heart failure with preserved ejection fraction (HCC)   HTN (hypertension)   Chronic kidney disease, stage 3a (HCC)   Severe aortic stenosis   CAD in native artery   History of CVA (cerebrovascular accident)   OSA (obstructive sleep apnea)   Hypomagnesemia   Syncope: syncopal event without prodrome, after he stood up.  Broad differentials including ?hypotension, cardiogenic given underlying arrhythmia with hypomagnesemia, or secondary to known severe aortic stenosis.TTE 02/19/2022 showed EF 55%, severe concentric LVH, severe aortic stenosis.  CT head on admission no acute finding. Orthostatic vitals negative.UA: Negative for infection,UDS + THC cont  PT/OT eval, request cardiology evaluation.  Severe aortic stenosis:Known , he has been evaluated by CT surgery and cardiology to determine whether to proceed with surgical AVR or TAVR.  Requesting cardiology inputs.  Chronic anemia: Hemoglobin dropped in 7.6 g> Hb stable at 11 g on recheck Recent Labs  Lab 03/24/22 1833 03/25/22 0415 03/25/22 0915  HGB 10.4* 7.6* 11.4*  HCT 34.1* 23.1* 36.1*    Hypokalemia hypomagnesemia: Replacement ordered.  Recheck Hypocalcemia: Replacement ordered does have low albumin.  Recheck Recent Labs  Lab 03/24/22 1833 03/25/22 0415  K 4.3 3.1*  CALCIUM 7.5* 6.1*  MG 1.4* 1.6*    Persistent atrial fibrillation holding Coreg and Cardizem due to bradycardia.  On Eliquis Chronic diastolic CHF euvolemic.:  Holding Lasix Coreg Farxiga given syncope Essential hypertension BP soft holding antihypertensive> monitor and reintroduce gradually CAD- no CP, neg Troponin , on aspirin Lipitor. History of CVA: Stable. OSA-severe OSA: not on CPAP CKD3a-stable Recent Labs    01/24/22 0608 01/25/22 0240 02/16/22 1122 02/17/22 1933 02/18/22 1040 02/19/22 0128 02/20/22 0747 03/08/22 1558 03/24/22 1833 03/25/22 0415  BUN 22 24* 22 25* 24* 22 16 15 19 15   CREATININE 1.47* 1.69* 1.75* 1.96* 1.65* 1.61* 1.25* 1.25 1.29* 0.89    DVT prophylaxis: eliquis Code Status:   Code Status: Full Code Family Communication: plan of care discussed with patient at bedside. Patient status is: Admitted observation remains hospitalized due to ongoing workup and evaluation of syncope  Level of care: Telemetry Cardiac  Dispo: The patient is from: Home            Anticipated disposition: TBD.  Obtain PT OT evaluation Objective:  Vitals last 24 hrs: Vitals:   03/25/22 0914 03/25/22 0930 03/25/22 1000 03/25/22 1015  BP:  127/89  (!) 136/91  Pulse:  65  72  Resp:  (!) 23  (!) 21  Temp: 98.4 F (36.9 C)     TempSrc: Oral     SpO2:  97%  98%  Weight:   88 kg    Weight change:    Physical Examination: General exam: alert awake, older than stated age HEENT:Oral mucosa moist, Ear/Nose WNL grossly Respiratory system: bilaterally clear BS,no use of accessory muscle Cardiovascular system: S1 & S2 +, No JVD. Gastrointestinal system: Abdomen soft,NT,ND, BS+ Nervous System:Alert, awake, moving extremities. Extremities: LE edema neg, right knee with dressing in place with mild skin breakdown from fall , some disease present right supra patellar area  Skin: No rashes,no icterus. MSK: Normal muscle bulk,tone, power  Medications reviewed:  Scheduled Meds:  apixaban  5 mg Oral BID   aspirin EC  81 mg Oral Daily   atorvastatin  20 mg Oral Daily   sodium chloride flush  3 mL Intravenous Q12H   Continuous Infusions:  Intake/Output Summary (Last 24 hours) at 03/25/2022 1124 Last data filed at 03/25/2022 9622 Gross per 24 hour  Intake 37.71 ml  Output --  Net 37.71 ml   Net IO Since Admission: 37.71 mL [03/25/22 1124]  Wt Readings from Last 3 Encounters:  03/25/22 88 kg  03/08/22 88 kg  02/20/22 98.6 kg     Unresulted Labs (From admission, onward)     Start     Ordered   03/26/22 0500  Basic metabolic panel  Tomorrow morning,   R        03/25/22 1049   03/26/22 0500  Magnesium  Tomorrow morning,   R        03/25/22 1049   03/26/22 0500  Phosphorus  Tomorrow morning,   R        03/25/22 1049   03/25/22 0850  Occult blood card to lab, stool  Once,   R        03/25/22 0849          Data Reviewed: I have personally reviewed following labs and imaging studies CBC: Recent Labs  Lab 03/24/22 1833 03/25/22 0415 03/25/22 0915  WBC 5.5 3.3*  --   NEUTROABS 3.2  --   --   HGB 10.4* 7.6* 11.4*  HCT 34.1* 23.1* 36.1*  MCV 74.9* 72.9*  --   PLT 147* 109*  --    Basic Metabolic Panel: Recent Labs  Lab 03/24/22 1833 03/25/22 0415  NA 140 143  K 4.3 3.1*  CL 114* 121*  CO2 16* 15*  GLUCOSE 94 72  BUN 19 15  CREATININE 1.29* 0.89  CALCIUM 7.5* 6.1*   MG 1.4* 1.6*   GFR: Estimated Creatinine Clearance: 94.5 mL/min (by C-G formula based on SCr of 0.89 mg/dL). Liver Function Tests: Recent Labs  Lab 03/24/22 1833  AST 20  ALT 10  ALKPHOS 63  BILITOT 0.4  PROT 5.7*  ALBUMIN 2.5*  CBG: Recent Labs  Lab 03/24/22 1923 03/25/22 0602  GLUCAP 108* 97  No results found for this or any previous visit (from the past 240 hour(s)).  Antimicrobials: Anti-infectives (From admission, onward)    None      Culture/Microbiology    Component Value Date/Time   SDES URINE, CLEAN CATCH 02/19/2022 0346   SPECREQUEST  02/19/2022 0346    NONE Performed at [facility],  [address] [place], [place] [address]    CULT MULTIPLE SPECIES PRESENT, SUGGEST RECOLLECTION (A) 02/19/2022 0346   REPTSTATUS 02/20/2022 FINAL 02/19/2022 0346   Radiology Studies: CT Head Wo Contrast  Result Date: 03/24/2022 CLINICAL DATA:  Mental status change, unknown cause EXAM: CT HEAD WITHOUT CONTRAST TECHNIQUE: Contiguous axial images were obtained from the base of the skull through the vertex without intravenous contrast. RADIATION DOSE REDUCTION: This exam was performed according to the departmental dose-optimization program which includes automated exposure control, adjustment of the mA and/or kV according to patient size and/or use of iterative reconstruction technique. COMPARISON:  CT head 02/17/2022 CT head 01/01/2022. FINDINGS: Brain: Patchy and confluent areas of decreased attenuation are noted throughout the deep and periventricular white matter of the cerebral hemispheres bilaterally, compatible with chronic microvascular ischemic disease. No evidence of large-territorial acute infarction. No parenchymal hemorrhage. No mass lesion. No extra-axial collection. No mass effect or midline shift. No hydrocephalus. Basilar cisterns are patent. Vascular: No hyperdense vessel. Atherosclerotic calcifications are present within the cavernous internal carotid and  vertebral arteries. Skull: No acute fracture or focal lesion. Sinuses/Orbits: Paranasal sinuses and mastoid air cells are clear. The orbits are unremarkable. Other: None. IMPRESSION: No acute intracranial abnormality. Electronically Signed   By: Iven Finn M.D.   On: 03/24/2022 20:20   DG Chest 1 View  Result Date: 03/24/2022 CLINICAL DATA:  syncope EXAM: CHEST  1 VIEW COMPARISON:  Chest x-Beer 02/17/2022 on FINDINGS: Enlarged cardiac silhouette. The heart and mediastinal contours are unchanged. Prominent hilar vasculature. Atherosclerotic plaque. No focal consolidation. No pulmonary edema. No pleural effusion. No pneumothorax. No acute osseous abnormality. IMPRESSION: 1. Central venous congestion with no frank pulmonary edema. 2.  Aortic Atherosclerosis (ICD10-I70.0). Electronically Signed   By: Iven Finn M.D.   On: 03/24/2022 19:26     LOS: 0 days   Antonieta Pert, MD [facility]  03/25/2022, 11:24 AM

## 2022-03-25 NOTE — ED Notes (Signed)
Orthostatics taken and charted on pt. Pt was then cleaned and changed with family member assistance
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
## 2022-03-25 NOTE — Care Management Obs Status (Signed)
MEDICARE OBSERVATION STATUS NOTIFICATION   Patient Details  Name: [name] MRN: [id number] Date of Birth: [birth year]   Medicare Observation Status Notification Given:  Yes    Leone Haven, RN 03/25/2022, 2:48 PM

## 2022-03-25 NOTE — TOC Progression Note (Signed)
Transition of Care [facility]) - Progression Note    Patient Details  Name: [name] MRN: [id number] Date of Birth: 09/13/1960  [facility]) CM/SW Contact  Leone Haven, RN Phone Number: 03/25/2022, 4:28 PM  Clinical Narrative:    From home with wife, indep, presents with syncope episode.  TOC following.         Expected Discharge Plan and Services                                               Social Determinants of Health (SDOH) Interventions SDOH Screenings   Food Insecurity: No Food Insecurity (02/21/2022)  Housing: High Risk (02/18/2022)  Transportation Needs: No Transportation Needs (02/21/2022)  Utilities: Not At Risk (02/18/2022)  Alcohol Screen: Low Risk  (09/30/2021)  Depression (PHQ2-9): Low Risk  (11/23/2021)  Financial Resource Strain: High Risk (04/07/2020)  Physical Activity: Insufficiently Active (09/30/2021)  Social Connections: Moderately Integrated (09/30/2021)  Stress: No Stress Concern Present (09/30/2021)  Tobacco Use: Low Risk  (03/08/2022)    Readmission Risk Interventions    01/19/2022    3:56 PM  Readmission Risk Prevention Plan  Transportation Screening Complete  Home Care Screening Complete  Medication Review (RN CM) Complete

## 2022-03-25 NOTE — ED Notes (Signed)
Patient watching tv and eating lunch. VSS. Will continue to monitor

## 2022-03-25 NOTE — ED Notes (Signed)
MD made aware of critical lab result from pt. Calcium 6.1

## 2022-03-26 DIAGNOSIS — M25461 Effusion, right knee: Secondary | ICD-10-CM | POA: Diagnosis not present

## 2022-03-26 DIAGNOSIS — R55 Syncope and collapse: Secondary | ICD-10-CM | POA: Diagnosis not present

## 2022-03-26 DIAGNOSIS — M25561 Pain in right knee: Secondary | ICD-10-CM

## 2022-03-26 LAB — MAGNESIUM: Magnesium: 2 mg/dL (ref 1.7–2.4)

## 2022-03-26 LAB — SEDIMENTATION RATE: Sed Rate: 21 mm/hr — ABNORMAL HIGH (ref 0–16)

## 2022-03-26 LAB — GLUCOSE, CAPILLARY: Glucose-Capillary: 96 mg/dL (ref 70–99)

## 2022-03-26 LAB — BASIC METABOLIC PANEL
Anion gap: 6 (ref 5–15)
BUN: 19 mg/dL (ref 8–23)
CO2: 22 mmol/L (ref 22–32)
Calcium: 9.2 mg/dL (ref 8.9–10.3)
Chloride: 109 mmol/L (ref 98–111)
Creatinine, Ser: 1.44 mg/dL — ABNORMAL HIGH (ref 0.61–1.24)
GFR, Estimated: 55 mL/min — ABNORMAL LOW (ref >60)
Glucose, Bld: 122 mg/dL — ABNORMAL HIGH (ref 70–99)
Potassium: 4 mmol/L (ref 3.5–5.1)
Sodium: 137 mmol/L (ref 135–145)

## 2022-03-26 LAB — C-REACTIVE PROTEIN: CRP: 0.7 mg/dL (ref <1.0)

## 2022-03-26 LAB — PHOSPHORUS: Phosphorus: 2.7 mg/dL (ref 2.5–4.6)

## 2022-03-26 LAB — MRSA NEXT GEN BY PCR, NASAL: MRSA by PCR Next Gen: DETECTED — AB

## 2022-03-26 MED ORDER — DILTIAZEM HCL ER COATED BEADS 240 MG PO CP24
240.0000 mg | ORAL_CAPSULE | Freq: Every day | ORAL | Status: DC
  Administered 2022-03-26 – 2022-03-27 (×2): 240 mg via ORAL
  Filled 2022-03-26 (×2): qty 1

## 2022-03-26 MED ORDER — IRBESARTAN 300 MG PO TABS
300.0000 mg | ORAL_TABLET | Freq: Every day | ORAL | Status: DC
  Administered 2022-03-26 – 2022-03-27 (×2): 300 mg via ORAL
  Filled 2022-03-26 (×2): qty 1

## 2022-03-26 MED ORDER — HYDRALAZINE HCL 20 MG/ML IJ SOLN
5.0000 mg | Freq: Four times a day (QID) | INTRAMUSCULAR | Status: DC | PRN
  Administered 2022-03-26: 5 mg via INTRAVENOUS
  Filled 2022-03-26: qty 1

## 2022-03-26 MED ORDER — CLONIDINE HCL 0.1 MG PO TABS
0.1000 mg | ORAL_TABLET | Freq: Three times a day (TID) | ORAL | Status: DC
  Administered 2022-03-26 – 2022-03-27 (×4): 0.1 mg via ORAL
  Filled 2022-03-26 (×4): qty 1

## 2022-03-26 MED ORDER — CARVEDILOL 25 MG PO TABS
25.0000 mg | ORAL_TABLET | Freq: Two times a day (BID) | ORAL | Status: DC
  Administered 2022-03-26 – 2022-03-27 (×3): 25 mg via ORAL
  Filled 2022-03-26 (×3): qty 1

## 2022-03-26 MED ORDER — DIPHENHYDRAMINE HCL 25 MG PO CAPS
25.0000 mg | ORAL_CAPSULE | Freq: Three times a day (TID) | ORAL | Status: DC | PRN
  Administered 2022-03-26 (×2): 25 mg via ORAL
  Filled 2022-03-26 (×2): qty 1
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
## 2022-03-26 NOTE — Progress Notes (Signed)
Flu shot offered, patient refused.

## 2022-03-26 NOTE — Progress Notes (Signed)
Patient requesting medication for itching. Provider messaged.

## 2022-03-26 NOTE — Evaluation (Addendum)
Physical Therapy Evaluation & Discharge Patient Details Name: [name] MRN: [id number] DOB: [birth year] Today's Date: 03/26/2022  History of Present Illness  Pt is a 61 y.o. male admitted 03/24/22 for evaluation of syncopal episode with fall onto R knee; etiology unclear. Head CT negative for acute abnormalities. R knee imaging with small suprapatellar joint effusion. PMH includes CHF, HTN, severe AS (potential candidate for TAVR), OSA, CAD, CVA, PAD, HLD, afib.   Clinical Impression  Patient evaluated by Physical Therapy with no further acute PT needs identified. PTA, pt mod indep with intermittent use of SPC, lives with wife. Today, pt independent with mobility and observed self-care tasks; ambulation distance limited by tachycardia with sitting/standing activity (see values below). Pt able to accept weight onto R knee without issues, demonstrates good AROM though sore. All education has been completed and the patient has no further questions. Acute PT is signing off. Thank you for this referral.    HR 102-152 with brief jump up to 172 while standing, though not sustained; pt asymptomatic Sitting BP 190/132 Standing BP 178/115    Recommendations for follow up therapy are one component of a multi-disciplinary discharge planning process, led by the attending physician.  Recommendations may be updated based on patient status, additional functional criteria and insurance authorization.  Follow Up Recommendations No PT follow up      Assistance Recommended at Discharge PRN  Patient can return home with the following   (N/A)    Equipment Recommendations None recommended by PT  Recommendations for Other Services   N/A   Functional Status Assessment       Precautions / Restrictions Precautions Precautions: Other (comment) Precaution Comments: watch BP and HR (high) Restrictions Weight Bearing Restrictions: No      Mobility  Bed Mobility Overal bed mobility: Independent                   Transfers Overall transfer level: Independent Equipment used: None                    Ambulation/Gait Ambulation/Gait assistance: Independent Gait Distance (Feet): 12 Feet Assistive device: None Gait Pattern/deviations: Step-through pattern, Decreased stride length       General Gait Details: deferred further ambulation distance secondary to tachycardia  Stairs            Wheelchair Mobility    Modified Rankin (Stroke Patients Only)       Balance Overall balance assessment: No apparent balance deficits (not formally assessed)                                           Pertinent Vitals/Pain Pain Assessment Pain Assessment: Faces Faces Pain Scale: Hurts a little bit Pain Location: R knee Pain Descriptors / Indicators: Sore Pain Intervention(s): Monitored during session    Home Living Family/patient expects to be discharged to:: Private residence Living Arrangements: Spouse/significant other;Children Available Help at Discharge: Family;Available 24 hours/day Type of Home: House Home Access: Stairs to enter Entrance Stairs-Rails: Left Entrance Stairs-Number of Steps: 3   Home Layout: One level Home Equipment: Agricultural consultant (2 wheels);Cane - single point      Prior Function Prior Level of Function : Independent/Modified Independent             Mobility Comments: mod indep ambulating with and without SPC ADLs Comments: independent with ADL/iADLs     Hand  Dominance        Extremity/Trunk Assessment   Upper Extremity Assessment Upper Extremity Assessment: Overall WFL for tasks assessed    Lower Extremity Assessment Lower Extremity Assessment: Overall WFL for tasks assessed (R knee with gauze; strength and AROM WFL though limited with c/o mild soreness; able to accept weight onto RLE well)    Cervical / Trunk Assessment Cervical / Trunk Assessment: Normal  Communication   Communication: No  difficulties  Cognition Arousal/Alertness: Awake/alert Behavior During Therapy: WFL for tasks assessed/performed Overall Cognitive Status: Within Functional Limits for tasks assessed                                 General Comments: reports "there's too much going on"        General Comments General comments (skin integrity, edema, etc.): HR 102, up to 172 briefly with standing. sitting BP 190/132, standing BP 178/115 - deferred further BP/orthostatic testing having pt lay down to "relax" and attempt to lower HR as HR still jumping 100s-140s sitting EOB. pt's wife present    Exercises     Assessment/Plan    PT Assessment Patient does not need any further PT services  PT Problem List         PT Treatment Interventions      PT Goals (Current goals can be found in the Care Plan section)  Acute Rehab PT Goals PT Goal Formulation: All assessment and education complete, DC therapy    Frequency       Co-evaluation               AM-PAC PT "6 Clicks" Mobility  Outcome Measure Help needed turning from your back to your side while in a flat bed without using bedrails?: None Help needed moving from lying on your back to sitting on the side of a flat bed without using bedrails?: None Help needed moving to and from a bed to a chair (including a wheelchair)?: None Help needed standing up from a chair using your arms (e.g., wheelchair or bedside chair)?: None Help needed to walk in hospital room?: None Help needed climbing 3-5 steps with a railing? : None 6 Click Score: 24    End of Session   Activity Tolerance: Patient tolerated treatment well;Treatment limited secondary to medical complications (Comment) (tachycardia) Patient left: in bed;with call bell/phone within reach;with family/visitor present Nurse Communication: Mobility status PT Visit Diagnosis: Other abnormalities of gait and mobility (R26.89)    Time: [id number] PT Time Calculation (min) (ACUTE  ONLY): 11 min   Charges:   PT Evaluation $PT Eval Moderate Complexity: [place], PT, DPT Acute Rehabilitation Services  Personal: [facility] Office: [phone]  Derry Lory 03/26/2022, 8:39 AM
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
## 2022-03-26 NOTE — Progress Notes (Signed)
[id number] CNA informs writer pt's pressure 198/145. Pt had no prn, Leory Plowman, MD for a prn medication. Hydralazine 5mg  IV ordered and administered.  pt's bp rechecked 169/117 and HR elevated 110s-130s. Pt complained of itching and seemed anxious about his back itching. [place], MD messaged again to notify her of vitals and a prn for itching. Pt currently resting in bed at lowest position, call bell in reach.

## 2022-03-26 NOTE — Progress Notes (Signed)
PROGRESS NOTE [name]  STM:[id number] DOB: [birth year] DOA: 03/24/2022 PCP: Storm Frisk, MD   Brief Narrative/Hospital Course: No notes on file  Recent admission in November for dizziness/hypertensive urgency, sepsis  from UTI A-fib with RVR acute to chronic diastolic CHF. He has history of bacteremia with right knee septic arthritis and had completed iv antibiotics and linezolid on 11/5 and PICC line was also removed on 11/15  Subjective: Feeling fine though right knee is bothering him  Assessment and Plan: Principal Problem:   Syncope Active Problems:   Hypertensive urgency   Persistent atrial fibrillation (HCC)   (HFpEF) heart failure with preserved ejection fraction (HCC)   HTN (hypertension)   Chronic kidney disease, stage 3a (HCC)   Severe aortic stenosis   CAD in native artery   PAD (peripheral artery disease) (HCC)   History of CVA (cerebrovascular accident)   OSA (obstructive sleep apnea)   Hypomagnesemia   Syncope: syncopal event without prodrome, after he stood up.  Had just eaten. No events on TTE here. Does have severe AS. Was somewhat hypotensive when he arrived her. Has ambulated well with pt. Cardiology consulted, thinks most likely etiology is vagal episode. Will continue to monitor on tele here. Cardiology would consider ziopatch if further episodes  Septic arthritis recent hospitalization for such. Had strep bacteremia in September, then admitted in October with CoNS septic arthritis of right knee with cultures growing staph sohnii and auricularis. Had picc, treated with pcn to daptomycin to linezolid. Completed abx on 11/15. Has sutures in right knee from prior operative debridement and today frank pus is coming from that surgical incision site. I will consult orthopedics and ID and check blood cultures.  Severe aortic stenosis:Known  per cardiology not a candicate for avr, consider TAVR once infections cleared  Chronic anemia: stable  Hypokalemia  hypomagnesemia: Replacement ordered.  Recheck Hypocalcemia: Replacement ordered does have low albumin.  Recheck Recent Labs  Lab 03/24/22 1833 03/25/22 0415 03/26/22 0049  K 4.3 3.1* 4.0  CALCIUM 7.5* 6.1* 9.2  MG 1.4* 1.6* 2.0  PHOS  --   --  2.7    Persistent atrial fibrillation re-start home coreg and dilt. On Eliquis Chronic diastolic CHF euvolemic.:  Holding Lasix Coreg Farxiga given syncope Essential hypertension bps soft on arrival now elevated will resume home meds CAD- no CP, neg Troponin , on aspirin Lipitor. History of CVA: Stable. OSA-severe OSA: not on CPAP CKD3a-stable Recent Labs    01/25/22 0240 02/16/22 1122 02/17/22 1933 02/18/22 1040 02/19/22 0128 02/20/22 0747 03/08/22 1558 03/24/22 1833 03/25/22 0415 03/26/22 0049  BUN 24* 22 25* 24* 22 16 15 19 15 19   CREATININE 1.69* 1.75* 1.96* 1.65* 1.61* 1.25* 1.25 1.29* 0.89 1.44*    DVT prophylaxis: eliquis Code Status:   Code Status: Full Code Family Communication: wife updated @ bedside 12/23 Patient status is: obs  Level of care: Telemetry Cardiac  Dispo: The patient is from: Home            Anticipated disposition: home Objective: Vitals last 24 hrs: Vitals:   03/26/22 0639 03/26/22 0700 03/26/22 0748 03/26/22 0800  BP: (!) 169/117  (!) 180/110   Pulse:  95  99  Resp:  (!) 25 (!) 21 (!) 23  Temp:   98.3 F (36.8 C)   TempSrc:   Oral   SpO2:  96% 97% 96%  Weight:      Height:       Weight change:   Physical Examination: General exam:  alert awake, older than stated age HEENT:Oral mucosa moist, Ear/Nose WNL grossly Respiratory system: bilaterally clear BS,no use of accessory muscle Cardiovascular system: harsh systolic murmur Gastrointestinal system: Abdomen soft,NT,ND, BS+ Nervous System:Alert, awake, moving extremities. Extremities: LE edema neg,  Skin: pus coming from right knee surgical incision, tender MSK: Normal muscle bulk,tone, power  Medications reviewed:  Scheduled Meds:   apixaban  5 mg Oral BID   aspirin EC  81 mg Oral Daily   atorvastatin  20 mg Oral Daily   carvedilol  25 mg Oral BID WC   cloNIDine  0.1 mg Oral TID   diltiazem  240 mg Oral Daily   influenza vac split quadrivalent PF  0.5 mL Intramuscular Tomorrow-1000   irbesartan  300 mg Oral Daily   sodium chloride flush  3 mL Intravenous Q12H   Continuous Infusions:  Intake/Output Summary (Last 24 hours) at 03/26/2022 1032 Last data filed at 03/26/2022 1000 Gross per 24 hour  Intake 240 ml  Output 200 ml  Net 40 ml   Net IO Since Admission: 77.71 mL [03/26/22 1032]  Wt Readings from Last 3 Encounters:  03/26/22 82.7 kg  03/08/22 88 kg  02/20/22 98.6 kg     Unresulted Labs (From admission, onward)     Start     Ordered   03/26/22 1027  Culture, blood (Routine X 2) w Reflex to ID Panel  BLOOD CULTURE X 2,   R (with TIMED occurrences)      03/26/22 1026          Data Reviewed: I have personally reviewed following labs and imaging studies CBC: Recent Labs  Lab 03/24/22 1833 03/25/22 0415 03/25/22 0915  WBC 5.5 3.3*  --   NEUTROABS 3.2  --   --   HGB 10.4* 7.6* 11.4*  HCT 34.1* 23.1* 36.1*  MCV 74.9* 72.9*  --   PLT 147* 109*  --    Basic Metabolic Panel: Recent Labs  Lab 03/24/22 1833 03/25/22 0415 03/26/22 0049  NA 140 143 137  K 4.3 3.1* 4.0  CL 114* 121* 109  CO2 16* 15* 22  GLUCOSE 94 72 122*  BUN 19 15 19   CREATININE 1.29* 0.89 1.44*  CALCIUM 7.5* 6.1* 9.2  MG 1.4* 1.6* 2.0  PHOS  --   --  2.7   GFR: Estimated Creatinine Clearance: 53.2 mL/min (A) (by C-G formula based on SCr of 1.44 mg/dL (H)). Liver Function Tests: Recent Labs  Lab 03/24/22 1833  AST 20  ALT 10  ALKPHOS 63  BILITOT 0.4  PROT 5.7*  ALBUMIN 2.5*  CBG: Recent Labs  Lab 03/24/22 1923 03/25/22 0602 03/26/22 0557  GLUCAP 108* 97 96  No results found for this or any previous visit (from the past 240 hour(s)).  Antimicrobials: Anti-infectives (From admission, onward)    None       Culture/Microbiology    Component Value Date/Time   SDES URINE, CLEAN CATCH 02/19/2022 0346   SPECREQUEST  02/19/2022 0346    NONE Performed at [facility] [address] [place], [place] [address]    CULT MULTIPLE SPECIES PRESENT, SUGGEST RECOLLECTION (A) 02/19/2022 0346   REPTSTATUS 02/20/2022 FINAL 02/19/2022 0346   Radiology Studies: DG Knee 1-2 Views Right  Result Date: 03/25/2022 CLINICAL DATA:  Knee pain. EXAM: RIGHT KNEE - 1-2 VIEW COMPARISON:  None Available. FINDINGS: No evidence of fracture, dislocation, or joint effusion. Tricompartmental joint space narrowing prominent in the patellofemoral compartment with small suprapatellar joint effusion. Soft  tissues are unremarkable. IMPRESSION: Tricompartmental degenerative changes prominent in the patellofemoral compartment with small suprapatellar joint effusion. Electronically Signed   By: Keane Police D.O.   On: 03/25/2022 16:02   CT Head Wo Contrast  Result Date: 03/24/2022 CLINICAL DATA:  Mental status change, unknown cause EXAM: CT HEAD WITHOUT CONTRAST TECHNIQUE: Contiguous axial images were obtained from the base of the skull through the vertex without intravenous contrast. RADIATION DOSE REDUCTION: This exam was performed according to the departmental dose-optimization program which includes automated exposure control, adjustment of the mA and/or kV according to patient size and/or use of iterative reconstruction technique. COMPARISON:  CT head 02/17/2022 CT head 01/01/2022. FINDINGS: Brain: Patchy and confluent areas of decreased attenuation are noted throughout the deep and periventricular white matter of the cerebral hemispheres bilaterally, compatible with chronic microvascular ischemic disease. No evidence of large-territorial acute infarction. No parenchymal hemorrhage. No mass lesion. No extra-axial collection. No mass effect or midline shift. No hydrocephalus. Basilar cisterns are patent. Vascular: No  hyperdense vessel. Atherosclerotic calcifications are present within the cavernous internal carotid and vertebral arteries. Skull: No acute fracture or focal lesion. Sinuses/Orbits: Paranasal sinuses and mastoid air cells are clear. The orbits are unremarkable. Other: None. IMPRESSION: No acute intracranial abnormality. Electronically Signed   By: Iven Finn M.D.   On: 03/24/2022 20:20   DG Chest 1 View  Result Date: 03/24/2022 CLINICAL DATA:  syncope EXAM: CHEST  1 VIEW COMPARISON:  Chest x-[place] 02/17/2022 on FINDINGS: Enlarged cardiac silhouette. The heart and mediastinal contours are unchanged. Prominent hilar vasculature. Atherosclerotic plaque. No focal consolidation. No pulmonary edema. No pleural effusion. No pneumothorax. No acute osseous abnormality. IMPRESSION: 1. Central venous congestion with no frank pulmonary edema. 2.  Aortic Atherosclerosis (ICD10-I70.0). Electronically Signed   By: Iven Finn M.D.   On: 03/24/2022 19:26     LOS: 0 days   Desma Maxim, MD [facility]  03/26/2022, 10:32 AM

## 2022-03-26 NOTE — Progress Notes (Signed)
   03/26/22 1140  Vitals  Temp 98.7 F (37.1 C)  Temp Source Oral  BP (!) 160/98  MAP (mmHg) 113  BP Method Automatic  Pulse Rate (!) 108  ECG Heart Rate (!) 103  Resp 17  Level of Consciousness  Level of Consciousness Alert  MEWS COLOR  MEWS Score Color Green  Oxygen Therapy  SpO2 93 %  O2 Device Room Air  MEWS Score  MEWS Temp 0  MEWS Systolic 0  MEWS Pulse 1  MEWS RR 0  MEWS LOC 0  MEWS Score 1

## 2022-03-26 NOTE — Progress Notes (Signed)
OT Cancellation Note  Patient Details Name: [name] MRN: [id number] DOB: 1961-01-27   Cancelled Treatment:    Reason Eval/Treat Not Completed: OT screened, no needs identified, will sign off. Per PT and patient, patient is back to baseline with no assist needed for ADL's, mobility, or safety. OT will sign off at this time. Please reconsult if needs arise.   Kenitha Glendinning Elane Nero Sawatzky 03/26/2022, 9:21 AM

## 2022-03-26 NOTE — Plan of Care (Signed)
  Problem: Education: Goal: Knowledge of condition and prescribed therapy will improve Outcome: Progressing   Problem: Cardiac: Goal: Will achieve and/or maintain adequate cardiac output Outcome: Progressing   Problem: Physical Regulation: Goal: Complications related to the disease process, condition or treatment will be avoided or minimized Outcome: Progressing   Problem: Education: Goal: Knowledge of General Education information will improve Description: Including pain rating scale, medication(s)/side effects and non-pharmacologic comfort measures Outcome: Progressing   Problem: Health Behavior/Discharge Planning: Goal: Ability to manage health-related needs will improve Outcome: Progressing   Problem: Clinical Measurements: Goal: Ability to maintain clinical measurements within normal limits will improve Outcome: Progressing Goal: Will remain free from infection Outcome: Progressing Goal: Diagnostic test results will improve Outcome: Progressing Goal: Respiratory complications will improve Outcome: Progressing Goal: Cardiovascular complication will be avoided Outcome: Progressing   Problem: Activity: Goal: Risk for activity intolerance will decrease Outcome: Progressing   Problem: Nutrition: Goal: Adequate nutrition will be maintained Outcome: Progressing   Problem: Coping: Goal: Level of anxiety will decrease Outcome: Progressing   Problem: Elimination: Goal: Will not experience complications related to bowel motility Outcome: Progressing Goal: Will not experience complications related to urinary retention Outcome: Progressing   Problem: Pain Managment: Goal: General experience of comfort will improve Outcome: Progressing   Problem: Safety: Goal: Ability to remain free from injury will improve Outcome: Progressing   Problem: Skin Integrity: Goal: Risk for impaired skin integrity will decrease Outcome: Progressing   

## 2022-03-27 DIAGNOSIS — M009 Pyogenic arthritis, unspecified: Secondary | ICD-10-CM | POA: Diagnosis not present

## 2022-03-27 DIAGNOSIS — R55 Syncope and collapse: Secondary | ICD-10-CM | POA: Diagnosis not present

## 2022-03-27 DIAGNOSIS — M25561 Pain in right knee: Secondary | ICD-10-CM | POA: Diagnosis not present

## 2022-03-27 LAB — SYNOVIAL CELL COUNT + DIFF, W/ CRYSTALS
Crystals, Fluid: NONE SEEN
Eosinophils-Synovial: 5 % — ABNORMAL HIGH (ref 0–1)
Lymphocytes-Synovial Fld: 24 % — ABNORMAL HIGH (ref 0–20)
Monocyte-Macrophage-Synovial Fluid: 4 % — ABNORMAL LOW (ref 50–90)
Neutrophil, Synovial: 67 % — ABNORMAL HIGH (ref 0–25)

## 2022-03-27 LAB — BASIC METABOLIC PANEL
Anion gap: 8 (ref 5–15)
BUN: 15 mg/dL (ref 8–23)
CO2: 20 mmol/L — ABNORMAL LOW (ref 22–32)
Calcium: 9.3 mg/dL (ref 8.9–10.3)
Chloride: 107 mmol/L (ref 98–111)
Creatinine, Ser: 1.34 mg/dL — ABNORMAL HIGH (ref 0.61–1.24)
GFR, Estimated: 60 mL/min — ABNORMAL LOW (ref >60)
Glucose, Bld: 92 mg/dL (ref 70–99)
Potassium: 4 mmol/L (ref 3.5–5.1)
Sodium: 135 mmol/L (ref 135–145)

## 2022-03-27 LAB — CBC
HCT: 37.1 % — ABNORMAL LOW (ref 39.0–52.0)
Hemoglobin: 11.7 g/dL — ABNORMAL LOW (ref 13.0–17.0)
MCH: 23.1 pg — ABNORMAL LOW (ref 26.0–34.0)
MCHC: 31.5 g/dL (ref 30.0–36.0)
MCV: 73.3 fL — ABNORMAL LOW (ref 80.0–100.0)
Platelets: 175 10*3/uL (ref 150–400)
RBC: 5.06 MIL/uL (ref 4.22–5.81)
RDW: 16.7 % — ABNORMAL HIGH (ref 11.5–15.5)
WBC: 4.1 10*3/uL (ref 4.0–10.5)
nRBC: 0 % (ref 0.0–0.2)

## 2022-03-27 LAB — GLUCOSE, CAPILLARY: Glucose-Capillary: 98 mg/dL (ref 70–99)

## 2022-03-27 MED ORDER — OXYCODONE HCL 5 MG PO TABS: 5.0000 mg | ORAL_TABLET | Freq: Four times a day (QID) | ORAL | 0 refills | Status: AC | PRN

## 2022-03-27 MED ORDER — LINEZOLID 600 MG PO TABS: 600.0000 mg | ORAL_TABLET | Freq: Two times a day (BID) | ORAL | Status: DC

## 2022-03-27 MED ORDER — LINEZOLID 600 MG PO TABS: 600.0000 mg | ORAL_TABLET | Freq: Two times a day (BID) | ORAL | 0 refills | Status: DC

## 2022-03-27 MED ORDER — ORITAVANCIN DIPHOSPHATE 400 MG IV SOLR
1200.0000 mg | Freq: Once | INTRAVENOUS | Status: AC
  Administered 2022-03-27: 1200 mg via INTRAVENOUS
  Filled 2022-03-27: qty 120

## 2022-03-27 MED ORDER — LIDOCAINE HCL (PF) 1 % IJ SOLN
5.0000 mL | Freq: Once | INTRAMUSCULAR | Status: AC
  Administered 2022-03-27: 5 mL
  Filled 2022-03-27: qty 5

## 2022-03-27 MED ORDER — LINEZOLID 600 MG PO TABS: 600.0000 mg | ORAL_TABLET | Freq: Two times a day (BID) | ORAL | 0 refills | Status: AC

## 2022-03-27 MED ORDER — KETOROLAC TROMETHAMINE 30 MG/ML IJ SOLN
30.0000 mg | Freq: Once | INTRAMUSCULAR | Status: AC
  Administered 2022-03-27: 30 mg via INTRAVENOUS
  Filled 2022-03-27: qty 1

## 2022-03-27 MED ORDER — LINEZOLID 600 MG PO TABS
600.0000 mg | ORAL_TABLET | ORAL | Status: AC
  Administered 2022-03-27: 600 mg via ORAL
  Filled 2022-03-27: qty 1

## 2022-03-27 NOTE — Progress Notes (Signed)
Patient with IV antibiotic infusion that will complete around 1930. Patient spouse contacted and informed that patient will discharge post infusion and that he has some prescriptions available at [facility] on [address]. She will come pick up patient around 8pm.

## 2022-03-27 NOTE — Progress Notes (Signed)
PROGRESS NOTE    Patient: [name]                            PCP: [name], MD                    DOB: 18-Aug-1960            DOA: 03/24/2022 [id number]             DOS: 03/27/2022, 11:38 AM   LOS: 0 days   Date of Service: The patient was seen and examined on 03/27/2022  Subjective:   The patient was seen and examined this morning. Hemodynamically stable Still complaining of right knee open wound draining No further syncopal episodes or dizziness Otherwise no issues overnight .  Brief Narrative:   61 y.o. male with medical history significant for HFpEF (EF 55%), CAD, persistent A-fib/flutter on Eliquis, severe aortic stenosis, history of CVA, PAD, CKD stage IIIa, HTN, HLD, OSA, recent strep bacteremia and septic arthritis of the right knee earlier this year who is admitted for evaluation of syncope.    Assessment & Plan:   Principal Problem:   Syncope Active Problems:   Hypertensive urgency   Persistent atrial fibrillation (HCC)   (HFpEF) heart failure with preserved ejection fraction (HCC)   HTN (hypertension)   Chronic kidney disease, stage 3a (HCC)   Severe aortic stenosis   CAD in native artery   PAD (peripheral artery disease) (HCC)   History of CVA (cerebrovascular accident)   OSA (obstructive sleep apnea)   Septic arthritis of knee, right (HCC)   Hypomagnesemia     Assessment and Plan: * Syncope Admitted after syncopal event without prodrome.  This occurred after he stood up.  Differential is broad including orthostasis, hypotension, cardiogenic given underlying arrhythmia with hypomagnesemia, or secondary to known severe aortic stenosis. -Holding antihypertensives for now -Check orthostatic vitals -Keep on telemetry -Magnesium supplemented -TTE 02/19/2022 showed EF 55%, severe concentric LVH, severe aortic stenosis -Check UA, UDS -PT/OT eval  Persistent atrial fibrillation ([place]) Returning in atrial fibrillation.  Mildly bradycardic on  arrival. -Coreg and diltiazem were on hold  -Continue Eliquis -Restarting Coreg today  (HFpEF) heart failure with preserved ejection fraction (HCC) - Currently stable - Euvolemic to mildly hypovolemic on admission.  Last EF 55% on 02/19/2022. -was Holding Lasix, Coreg, [name] given syncopal event with borderline hypotension and bradycardia Improved bradycardia and hypotension --restarting Coreg and  Farxiga today 03/27/2022  HTN (hypertension) History of resistant hypertension on multiple antihypertensives.  These are on hold given syncopal event with borderline hypotension.   -BP has improved, restarting  beta-blockers  Chronic kidney disease, stage 3a ([place]) Renal function stable, GFR improved from baseline. Lab Results  Component Value Date   CREATININE 1.34 (H) 03/27/2022   CREATININE 1.44 (H) 03/26/2022   CREATININE 0.89 03/25/2022     Severe aortic stenosis Known severe aortic stenosis.  He has been evaluated by CT surgery and cardiology to determine whether to proceed with surgical AVR or TAVR.  CAD in native artery -Continue denies any chest pain -No changes in EKG -Continue aspirin and atorvastatin.  History of CVA (cerebrovascular accident) Continue aspirin, Eliquis, statin.  Hypomagnesemia IV supplement given.   -Monitoring closely  OSA (obstructive sleep apnea) Sleep study shows severe OSA.  Not sure if he has received CPAP/BiPAP yet.    -------------------------------------------------------------------------------------------------------------------------------------- Nutritional status:  The patient's BMI is: Body mass index is 26.54  kg/m. I agree with the assessment and plan as outlined ----------------------------------------------------------------------------------------------------------------------------------- Cultures; Blood Cultures x 2 >> NGT ?  Wound  cultures -------------------------------------------------------------------------------------------------------------------------------------  DVT prophylaxis:   apixaban (ELIQUIS) tablet 5 mg   Code Status:   Code Status: Full Code  Family Communication: Family present at bedside updated The above findings and plan of care has been discussed with patient (and family)  in detail,  they expressed understanding and agreement of above. -Advance care planning has been discussed.   Admission status:   Status is: Observation The patient remains OBS appropriate and will d/c before 2 midnights.     Procedures:   No admission procedures for hospital encounter.   Antimicrobials:  Anti-infectives (From admission, onward)    None        Medication:   apixaban  5 mg Oral BID   aspirin EC  81 mg Oral Daily   atorvastatin  20 mg Oral Daily   carvedilol  25 mg Oral BID WC   cloNIDine  0.1 mg Oral TID   diltiazem  240 mg Oral Daily   influenza vac split quadrivalent PF  0.5 mL Intramuscular Tomorrow-1000   irbesartan  300 mg Oral Daily   lidocaine (PF)  5 mL Other Once   sodium chloride flush  3 mL Intravenous Q12H    acetaminophen **OR** acetaminophen, diphenhydrAMINE, hydrALAZINE, prochlorperazine, senna-docusate   Objective:   Vitals:   03/26/22 1624 03/27/22 0056 03/27/22 0425 03/27/22 0927  BP: (!) 144/93 (!) 116/97  (!) 132/109  Pulse: 81 81 73 60  Resp:  18 18 20   Temp: 98.2 F (36.8 C) 98.3 F (36.8 C) 98.3 F (36.8 C) 98.2 F (36.8 C)  TempSrc: Oral Oral Oral Oral  SpO2: 94% 94%  97%  Weight:  81.5 kg    Height:        Intake/Output Summary (Last 24 hours) at 03/27/2022 1138 Last data filed at 03/27/2022 1000 Gross per 24 hour  Intake 243 ml  Output 425 ml  Net -182 ml   Filed Weights   03/25/22 1744 03/26/22 0021 03/27/22 0056  Weight: 86.6 kg 82.7 kg 81.5 kg     Examination:   Physical Exam  Constitution:  Alert, cooperative, no  distress,  Appears calm and comfortable  Psychiatric:   Normal and stable mood and affect, cognition intact,   HEENT:        Normocephalic, PERRL, otherwise with in Normal limits  Chest:         Chest symmetric Cardio vascular:  S1/S2, RRR, No murmure, No Rubs or Gallops  pulmonary: Clear to auscultation bilaterally, respirations unlabored, negative wheezes / crackles Abdomen: Soft, non-tender, non-distended, bowel sounds,no masses, no organomegaly Muscular skeletal: Anterior right knee superficial wound, draining, dressing in place, range of motion intact mild edema, no erythema,  Limited exam - in bed, able to move all 4 extremities,   Neuro: CNII-XII intact. , normal motor and sensation, reflexes intact  Extremities: No pitting edema lower extremities, +2 pulses  Skin: Dry, warm to touch, negative for any Rashes, No open wounds Wounds: per nursing documentation   ------------------------------------------------------------------------------------------------------------------------------------------    LABs:     Latest Ref Rng & Units 03/27/2022   12:46 AM 03/25/2022    9:15 AM 03/25/2022    4:15 AM  CBC  WBC 4.0 - 10.5 K/uL 4.1   3.3   Hemoglobin 13.0 - 17.0 g/dL 11.7  11.4  7.6   Hematocrit 39.0 - 52.0 % 37.1  36.1  23.1   Platelets 150 - 400 K/uL 175   109       Latest Ref Rng & Units 03/27/2022   12:46 AM 03/26/2022   12:49 AM 03/25/2022    4:15 AM  CMP  Glucose 70 - 99 mg/dL 92  353  72   BUN 8 - 23 mg/dL 15  19  15    Creatinine 0.61 - 1.24 mg/dL  2.99  2.42   Sodium 135 - 145 mmol/L 135  137  143   Potassium 3.5 - 5.1 mmol/L 4.0  4.0  3.1   Chloride 98 - 111 mmol/L 107  109  121   CO2 22 - 32 mmol/L 20  22  15    Calcium 8.9 - 10.3 mg/dL 9.3  9.2  6.1        Micro Results Recent Results (from the past 240 hour(s))  Culture, blood (Routine X 2) w Reflex to ID Panel     Status: None (Preliminary result)   Collection Time: 03/26/22  1:08 PM   Specimen:  BLOOD LEFT HAND  Result Value Ref Range Status   Specimen Description BLOOD LEFT HAND  Final   Special Requests   Final    BOTTLES DRAWN AEROBIC AND ANAEROBIC Blood Culture adequate volume   Culture   Final    NO GROWTH < 24 HOURS Performed at [facility], [address] [place], [address] [place]    Report Status PENDING  Incomplete  Culture, blood (Routine X 2) w Reflex to ID Panel     Status: None (Preliminary result)   Collection Time: 03/26/22  1:12 PM   Specimen: BLOOD LEFT HAND  Result Value Ref Range Status   Specimen Description BLOOD LEFT HAND  Final   Special Requests AEROBIC BOTTLE ONLY Blood Culture adequate volume  Final   Culture   Final    NO GROWTH < 24 HOURS Performed at [facility], [address], [place], [address] [place]    Report Status PENDING  Incomplete  MRSA Next Gen by PCR, Nasal     Status: Abnormal   Collection Time: 03/26/22  4:50 PM   Specimen: Nasal Mucosa; Nasal Swab  Result Value Ref Range Status   MRSA by PCR Next Gen DETECTED (A) NOT DETECTED Final    Comment: RESULT CALLED TO, READ BACK BY AND VERIFIED WITH:  [id number], RN 03/26/22 1908 A. LAFRANCE (NOTE) The GeneXpert MRSA Assay (FDA approved for NASAL specimens only), is one component of a comprehensive MRSA colonization surveillance program. It is not intended to diagnose MRSA infection nor to guide or monitor treatment for MRSA infections. Test performance is not FDA approved in patients less than 65 years old. Performed at [facility], [address], [place], [address] [place]     Radiology Reports No results found.  SIGNED: [place], MD, FHM. [facility],  Pager (please use [email or web address] to page/text) Please use Epic Secure Chat for non-urgent communication (7AM-7PM)  If 7PM-7AM, please contact night-coverage [email or web address], 03/27/2022, 11:38 AM

## 2022-03-27 NOTE — Progress Notes (Signed)
During shift assessment RN discovered that IV tubing was disconnected to pt IV. Immediately notified both pharmacist and MD  about the issue.   Prior to the pt discharge, Linezolid given STAT. Provide patient discharge education.

## 2022-03-27 NOTE — Discharge Summary (Signed)
Physician Discharge Summary   Patient: [name] MRN: [id number] DOB: Jan 13, 1961  Admit date:     03/24/2022  Discharge date: 03/27/22  Discharge Physician: Kendell Bane   PCP: Storm Frisk, MD   Recommendations at discharge:  Follow-up with orthopedic team within 1 week-  Follow-up with aspirated cultures from the knee with orthopedic team and appropriate antibiotics if needed Follow-up with your PCP within 1-2 weeks, your current medication is subject to change Monitor your blood pressure at 1-2 times a day, keep a record and report to PCP for adjustment of your current medications Per ID:: Was given a dose of oritavancin followed by 2 wk of linezolid which would give MRSA coverage as well. Plan to start linezolid on jan 7th.  Discharge Diagnoses: Principal Problem:   Syncope Active Problems:   Hypertensive urgency   Persistent atrial fibrillation (HCC)   (HFpEF) heart failure with preserved ejection fraction (HCC)   HTN (hypertension)   Chronic kidney disease, stage 3a (HCC)   Severe aortic stenosis   CAD in native artery   PAD (peripheral artery disease) (HCC)   History of CVA (cerebrovascular accident)   Pain and swelling of right knee   OSA (obstructive sleep apnea)   Septic arthritis of knee, right (HCC)   Hypomagnesemia  Resolved Problems:   * No resolved hospital problems. *  Hospital Course: 61 y.o. male with medical history significant for HFpEF (EF 55%), CAD, persistent A-fib/flutter on Eliquis, severe aortic stenosis, history of CVA, PAD, CKD stage IIIa, HTN, HLD, OSA, recent strep bacteremia and septic arthritis of the right knee earlier this year who is admitted for evaluation of syncope.  Assessment and Plan: * Syncope Admitted after syncopal event without prodrome.  This occurred after he stood up.  Differential is broad including orthostasis, hypotension, cardiogenic given underlying arrhythmia with hypomagnesemia, or secondary to known severe  aortic stenosis. -Modifying BP meds -Check orthostatic vitals -Magnesium supplemented -TTE 02/19/2022 showed EF 55%, severe concentric LVH, severe aortic stenosis -Check UA, UDS -PT/OT eval-- no need for f/up.   Persistent atrial fibrillation (HCC) Returning in atrial fibrillation.  Mildly bradycardic on arrival. -Diltiazem  on hold  -Continue Eliquis -Restarting Coreg today   Septic arthritis  - recent hospitalization for such. Had strep bacteremia in September, then admitted in October with CoNS septic arthritis of right knee with cultures growing staph sohnii and auricularis. Had picc, treated with pcn to daptomycin to linezolid. Completed abx on 11/15.  Has sutures in right knee from prior operative debridement and today frank pus is coming from that surgical incision site. Consulted orthopedics and ID and.    03/27/22 - underwent knee aspirate which showed WBC on gram stain, 67% neutrophils. Unable to do cell count since specimen clotted.   Plan per ID  Treat as septic arthritis, and follow up cultures to see if needs I x D. - will give a dose of oritavancin followed by 2 wk of linezolid which would give MRSA coverage as well. Plan to start linezolid on jan 7th.      (HFpEF) heart failure with preserved ejection fraction (HCC) - Currently stable - Euvolemic to mildly hypovolemic on admission.  Last EF 55% on 02/19/2022. -was Holding Lasix, Coreg, Marcelline Deist given syncopal event with borderline hypotension and bradycardia Improved bradycardia and hypotension --restarting Coreg and  Farxiga today 03/27/2022  HTN (hypertension) History of resistant hypertension on multiple antihypertensives.  These are on hold given syncopal event with borderline hypotension.   -BP has  improved, restarting  beta-blockers  Chronic kidney disease, stage 3a ([place]) Renal function stable, GFR improved from baseline. Lab Results  Component Value Date   CREATININE 1.34 (H) 03/27/2022    CREATININE 1.44 (H) 03/26/2022   CREATININE 0.89 03/25/2022     Severe aortic stenosis Known severe aortic stenosis.  He has been evaluated by CT surgery and cardiology to determine whether to proceed with surgical AVR or TAVR.  CAD in native artery -Continue denies any chest pain -No changes in EKG -Continue aspirin and atorvastatin.  History of CVA (cerebrovascular accident) Continue aspirin, Eliquis, statin.  Hypomagnesemia IV supplement given.   -Monitoring closely  OSA (obstructive sleep apnea) Sleep study shows severe OSA.  Not sure if he has received CPAP/BiPAP yet.     Consultants: Orthopedic team/ID Procedures performed: I&D of a right knee Disposition: Home Diet recommendation:  Discharge Diet Orders (From admission, onward)     Start     Ordered   03/27/22 0000  Diet - low sodium heart healthy        03/27/22 1319           Cardiac diet DISCHARGE MEDICATION: Allergies as of 03/27/2022       Reactions   Adhesive [tape] Other (See Comments)   Makes the skin feel as if it is burning, will also bruise the skin. Pt. prefers paper tape   Latex Hives, Itching, Other (See Comments)   Burns skin, also        Medication List     STOP taking these medications    cloNIDine 0.1 MG tablet Commonly known as: CATAPRES   hydrALAZINE 25 MG tablet Commonly known as: APRESOLINE       TAKE these medications    acetaminophen 500 MG tablet Commonly known as: TYLENOL Take 500 mg by mouth 3 (three) times daily as needed for moderate pain.   allopurinol 100 MG tablet Commonly known as: ZYLOPRIM Take 1 tablet (100 mg total) by mouth daily.   apixaban 5 MG Tabs tablet Commonly known as: ELIQUIS Take 1 tablet (5 mg total) by mouth 2 (two) times daily.   aspirin EC 81 MG tablet Take 81 mg by mouth daily. Swallow whole.   atorvastatin 20 MG tablet Commonly known as: LIPITOR Take 1 tablet (20 mg total) by mouth daily. Resume this after your  antibiotics are complete What changed: additional instructions   carvedilol 25 MG tablet Commonly known as: COREG Take 1 tablet (25 mg total) by mouth 2 (two) times daily with a meal.   diltiazem 240 MG 24 hr capsule Commonly known as: CARDIZEM CD Take 1 capsule (240 mg total) by mouth daily.   Farxiga 10 MG Tabs tablet Generic drug: dapagliflozin propanediol Take 10 mg by mouth daily.   ferrous sulfate 325 (65 FE) MG EC tablet Take 1 tablet (325 mg total) by mouth every other day.   furosemide 40 MG tablet Commonly known as: LASIX Take 1 tablet (40 mg total) by mouth daily.   gabapentin 400 MG capsule Commonly known as: NEURONTIN Take 2 capsules (800 mg total) by mouth 3 (three) times daily. What changed:  when to take this reasons to take this   linezolid 600 MG tablet Commonly known as: ZYVOX Take 1 tablet (600 mg total) by mouth every 12 (twelve) hours for 14 days. Start taking on: April 10, 2022   magnesium oxide 400 MG tablet Commonly known as: MAG-OX Take 1 tablet (400 mg total) by mouth every other day.  olmesartan 40 MG tablet Commonly known as: BENICAR Take 40 mg by mouth daily.   omeprazole 40 MG capsule Commonly known as: PRILOSEC Take 1 capsule (40 mg total) by mouth daily.   Oritavancin Diphosphate 1,200 mg in dextrose 5 % 880 mL Inject 1,200 mg into the vein.   oxyCODONE 5 MG immediate release tablet Commonly known as: Roxicodone Take 1 tablet (5 mg total) by mouth every 6 (six) hours as needed for up to 3 days.   potassium chloride SA 20 MEQ tablet Commonly known as: KLOR-CON M Take 1 tablet (20 mEq total) by mouth daily as needed. When you take your lasix What changed:  when to take this additional instructions        Discharge Exam: Filed Weights   03/25/22 1744 03/26/22 0021 03/27/22 0056  Weight: 86.6 kg 82.7 kg 81.5 kg  Physical Exam  Constitution:  Alert, cooperative, no distress,  Appears calm and comfortable  Psychiatric:    Normal and stable mood and affect, cognition intact,   HEENT:        Normocephalic, PERRL, otherwise with in Normal limits  Chest:         Chest symmetric Cardio vascular:  S1/S2, RRR, No murmure, No Rubs or Gallops  pulmonary: Clear to auscultation bilaterally, respirations unlabored, negative wheezes / crackles Abdomen: Soft, non-tender, non-distended, bowel sounds,no masses, no organomegaly Muscular skeletal: Anterior right knee superficial wound, draining, dressing in place, range of motion intact mild edema, no erythema,  Limited exam - in bed, able to move all 4 extremities,   Neuro: CNII-XII intact. , normal motor and sensation, reflexes intact  Extremities: No pitting edema lower extremities, +2 pulses  Skin: Dry, warm to touch, negative for any Rashes, No open wounds Wounds: per nursing documentation     Condition at discharge: good  The results of significant diagnostics from this hospitalization (including imaging, microbiology, ancillary and laboratory) are listed below for reference.   Imaging Studies: DG Knee 1-2 Views Right  Result Date: 03/25/2022 CLINICAL DATA:  Knee pain. EXAM: RIGHT KNEE - 1-2 VIEW COMPARISON:  None Available. FINDINGS: No evidence of fracture, dislocation, or joint effusion. Tricompartmental joint space narrowing prominent in the patellofemoral compartment with small suprapatellar joint effusion. Soft tissues are unremarkable. IMPRESSION: Tricompartmental degenerative changes prominent in the patellofemoral compartment with small suprapatellar joint effusion. Electronically Signed   By: Keane Police D.O.   On: 03/25/2022 16:02   CT Head Wo Contrast  Result Date: 03/24/2022 CLINICAL DATA:  Mental status change, unknown cause EXAM: CT HEAD WITHOUT CONTRAST TECHNIQUE: Contiguous axial images were obtained from the base of the skull through the vertex without intravenous contrast. RADIATION DOSE REDUCTION: This exam was performed according to the  departmental dose-optimization program which includes automated exposure control, adjustment of the mA and/or kV according to patient size and/or use of iterative reconstruction technique. COMPARISON:  CT head 02/17/2022 CT head 01/01/2022. FINDINGS: Brain: Patchy and confluent areas of decreased attenuation are noted throughout the deep and periventricular white matter of the cerebral hemispheres bilaterally, compatible with chronic microvascular ischemic disease. No evidence of large-territorial acute infarction. No parenchymal hemorrhage. No mass lesion. No extra-axial collection. No mass effect or midline shift. No hydrocephalus. Basilar cisterns are patent. Vascular: No hyperdense vessel. Atherosclerotic calcifications are present within the cavernous internal carotid and vertebral arteries. Skull: No acute fracture or focal lesion. Sinuses/Orbits: Paranasal sinuses and mastoid air cells are clear. The orbits are unremarkable. Other: None. IMPRESSION: No acute intracranial abnormality. Electronically  Signed   By: Iven Finn M.D.   On: 03/24/2022 20:20   DG Chest 1 View  Result Date: 03/24/2022 CLINICAL DATA:  syncope EXAM: CHEST  1 VIEW COMPARISON:  Chest x-Rodin 02/17/2022 on FINDINGS: Enlarged cardiac silhouette. The heart and mediastinal contours are unchanged. Prominent hilar vasculature. Atherosclerotic plaque. No focal consolidation. No pulmonary edema. No pleural effusion. No pneumothorax. No acute osseous abnormality. IMPRESSION: 1. Central venous congestion with no frank pulmonary edema. 2.  Aortic Atherosclerosis (ICD10-I70.0). Electronically Signed   By: Iven Finn M.D.   On: 03/24/2022 19:26    Microbiology: Results for orders placed or performed during the hospital encounter of 03/24/22  Culture, blood (Routine X 2) w Reflex to ID Panel     Status: None (Preliminary result)   Collection Time: 03/26/22  1:08 PM   Specimen: BLOOD LEFT HAND  Result Value Ref Range Status    Specimen Description BLOOD LEFT HAND  Final   Special Requests   Final    BOTTLES DRAWN AEROBIC AND ANAEROBIC Blood Culture adequate volume   Culture   Final    NO GROWTH < 24 HOURS Performed at [facility], [place] [address] [place], [place] [address]    Report Status PENDING  Incomplete  Culture, blood (Routine X 2) w Reflex to ID Panel     Status: None (Preliminary result)   Collection Time: 03/26/22  1:12 PM   Specimen: BLOOD LEFT HAND  Result Value Ref Range Status   Specimen Description BLOOD LEFT HAND  Final   Special Requests AEROBIC BOTTLE ONLY Blood Culture adequate volume  Final   Culture   Final    NO GROWTH < 24 HOURS Performed at [facility], [place] [address] [place], [place] [address]    Report Status PENDING  Incomplete  MRSA Next Gen by PCR, Nasal     Status: Abnormal   Collection Time: 03/26/22  4:50 PM   Specimen: Nasal Mucosa; Nasal Swab  Result Value Ref Range Status   MRSA by PCR Next Gen DETECTED (A) NOT DETECTED Final    Comment: RESULT CALLED TO, READ BACK BY AND VERIFIED WITH:  Linward Foster, RN 03/26/22 1908 A. LAFRANCE (NOTE) The GeneXpert MRSA Assay (FDA approved for NASAL specimens only), is one component of a comprehensive MRSA colonization surveillance program. It is not intended to diagnose MRSA infection nor to guide or monitor treatment for MRSA infections. Test performance is not FDA approved in patients less than 60 years old. Performed at [facility], [place] [address], [place], [place] [address]   Body fluid culture w Gram Stain     Status: None (Preliminary result)   Collection Time: 03/27/22 12:05 PM   Specimen: Synovium; Synovial Fluid  Result Value Ref Range Status   Specimen Description SYNOVIAL  Final   Special Requests RIGHT KNEE  Final   Gram Stain   Final    RARE WBC PRESENT, PREDOMINANTLY PMN NO ORGANISMS SEEN Performed at [facility], [address], [place], [place] [address]    Culture PENDING   Incomplete   Report Status PENDING  Incomplete    Labs: CBC: Recent Labs  Lab 03/24/22 1833 03/25/22 0415 03/25/22 0915 03/27/22 0046  WBC 5.5 3.3*  --  4.1  NEUTROABS 3.2  --   --   --   HGB 10.4* 7.6* 11.4* 11.7*  HCT 34.1* 23.1* 36.1* 37.1*  MCV 74.9* 72.9*  --  73.3*  PLT  147* 109*  --  0000000   Basic Metabolic Panel: Recent Labs  Lab 03/24/22 1833 03/25/22 0415 03/26/22 0049 03/27/22 0046  NA 140 143 137 135  K 4.3 3.1* 4.0 4.0  CL 114* 121* 109 107  CO2 16* 15* 22 20*  GLUCOSE 94 72 122* 92  BUN 19 15 19 15   CREATININE 1.29* 0.89 1.44* 1.34*  CALCIUM 7.5* 6.1* 9.2 9.3  MG 1.4* 1.6* 2.0  --   PHOS  --   --  2.7  --    Liver Function Tests: Recent Labs  Lab 03/24/22 1833  AST 20  ALT 10  ALKPHOS 63  BILITOT 0.4  PROT 5.7*  ALBUMIN 2.5*   CBG: Recent Labs  Lab 03/24/22 1923 03/25/22 0602 03/26/22 0557 03/27/22 0626  GLUCAP 108* 97 96 98    Discharge time spent: greater than 30 minutes.  Signed: Deatra James, MD [facility] 03/27/2022

## 2022-03-27 NOTE — Progress Notes (Signed)
ID PROGRESS NOTE  Patient underwent knee aspirate which showed WBC on gram stain, 67% neutrophils. Unable to do cell count since specimen clotted.   Plan: Treat as septic arthritis, and follow up cultures to see if needs I x D. - will give a dose of oritavancin followed by 2 wk of linezolid which would give MRSA coverage as well. Plan to start linezolid on jan 7th.  Will arrange for follow up in the ID clinic  Ryin Schillo B. Drue Second MD MPH [facility] [phone]

## 2022-03-27 NOTE — Procedures (Signed)
Procedure: Right knee aspiration and injection   Indication: Right knee effusion(s)   Provider: Alfonse Alpers, PA-C   Assist: None   Anesthesia: 3 mL Lidocaine 1%   EBL: None   Complications: None   Findings: After risks/benefits explained patient desires to undergo procedure. Consent obtained and time out performed. The right knee was sterilely prepped and aspirated. Pt tolerated the procedure well.    PRE-PROCEDURE DIAGNOSIS:  Right knee effusion(s) POST-OPERATIVE DIAGNOSIS:  Same PROCEDURE:  Aspiration right knee  PROCEDURE DETAILS:  After informed verbal consent was obtained the superolateral portal was prepped with chlorhexidine. Approximately 3 mL of lidocaine were injected superficially and then into the deeper tissues and into the joint space. Flash identified appropriate positioning and pressurized joint space. Medicine was left to sit for several minutes to take effect. Next, a 10 mL syringe with an 18-gauge needle was used to aspirate approximately 2-3 mL of bloody fluid. He tolerated this well without complication and a Band-Aid and bulky dressing was placed.   Alfonse Alpers, PA-C 03/27/2022 12:16 PM

## 2022-03-27 NOTE — Progress Notes (Signed)
   ORTHOPAEDIC PROGRESS NOTE  C/c: Right knee pain and swelling   SUBJECTIVE: Patient states his knee feels much better now that the stitches have been removed. He states he is not having any pain. He wants to go home and be with his kids.   OBJECTIVE: PE: Right knee: proximal aspect of the incision with granulation tissues and a yellow fibrinous material, no active bleeding or drainage, he has some swelling around the incision, but he states this is non-tender to palpation. Was not able to express any purulent material from the wound. He has some swelling of the right knee compared to the contralateral side. He is non-tender to palpation throughout the right knee. Range of motion of right knee from 0-100 degrees without pain. NVI.   Vitals:   03/27/22 0425 03/27/22 0927  BP:  (!) 132/109  Pulse: 73 60  Resp: 18 20  Temp: 98.3 F (36.8 C) 98.2 F (36.8 C)  SpO2:  97%    ASSESSMENT: [name] is a 61 y.o. male wound concerns after having nylon sutures in place since 01/17/2022.   PLAN: Discussed with the patient it does not seem like he has a septic joint, but would like to aspirate the knee and send off for cultures especially based on his history. He feels better and is asking the nursing staff if he can be discharged. He was agreeable to a right knee aspiration after further discussion. Continue with broad-spectrum antibiotics per primary team. We will follow-up on cultures once these are available. Okay for weightbearing and range of motion as tolerated.   Contact information: After hours and holidays please check [email or web address] for group call information for [facility]   Alfonse Alpers, PA-C 03/27/2022

## 2022-03-27 NOTE — Progress Notes (Addendum)
Pt with suspected septic knee. D/w Dr. Drue Second we will give him a dose oritavancin x1 then discharge home with 2 wks of linezolid starting on 12/31.   Addendum  PM shift Rn stated that the oritavancin bag was not hooked up correctly so the drug was drained on the bed. D/w Dr Drue Second tonight and will start linezolid today instead.    Ulyses Southward, PharmD, BCIDP, AAHIVP, CPP Infectious Disease Pharmacist 03/27/2022 4:02 PM

## 2022-03-27 NOTE — Plan of Care (Signed)
  Problem: Education: Goal: Knowledge of condition and prescribed therapy will improve Outcome: Progressing   Problem: Cardiac: Goal: Will achieve and/or maintain adequate cardiac output Outcome: Progressing   Problem: Education: Goal: Knowledge of General Education information will improve Description: Including pain rating scale, medication(s)/side effects and non-pharmacologic comfort measures Outcome: Progressing   Problem: Health Behavior/Discharge Planning: Goal: Ability to manage health-related needs will improve Outcome: Progressing   Problem: Clinical Measurements: Goal: Ability to maintain clinical measurements within normal limits will improve Outcome: Progressing Goal: Will remain free from infection Outcome: Progressing Goal: Diagnostic test results will improve Outcome: Progressing Goal: Respiratory complications will improve Outcome: Progressing Goal: Cardiovascular complication will be avoided Outcome: Progressing   Problem: Activity: Goal: Risk for activity intolerance will decrease Outcome: Progressing   Problem: Nutrition: Goal: Adequate nutrition will be maintained Outcome: Progressing   Problem: Coping: Goal: Level of anxiety will decrease Outcome: Progressing   Problem: Elimination: Goal: Will not experience complications related to urinary retention Outcome: Progressing   Problem: Pain Managment: Goal: General experience of comfort will improve Outcome: Progressing   Problem: Safety: Goal: Ability to remain free from injury will improve Outcome: Progressing   Problem: Skin Integrity: Goal: Risk for impaired skin integrity will decrease Outcome: Progressing   Problem: Elimination: Goal: Will not experience complications related to bowel motility Outcome: Not Progressing

## 2022-03-30 ENCOUNTER — Telehealth: Payer: Self-pay

## 2022-03-30 LAB — BODY FLUID CULTURE W GRAM STAIN: Culture: NO GROWTH

## 2022-03-30 NOTE — Patient Outreach (Signed)
  Care Coordination TOC Note Transition Care Management Unsuccessful Follow-up Telephone Call  Date of discharge and from where:  03/27/22-[place]  Attempts:  1st Attempt  Reason for unsuccessful TCM follow-up call:  Left voice message      Alessandra Grout [facility] Care Management Telephonic Care Management Coordinator Direct Phone: [phone] Toll Free: [phone] Fax: [phone]

## 2022-03-31 ENCOUNTER — Telehealth: Payer: Self-pay

## 2022-03-31 ENCOUNTER — Ambulatory Visit: Payer: Medicare Other | Admitting: Critical Care Medicine

## 2022-03-31 LAB — CULTURE, BLOOD (ROUTINE X 2)
Culture: NO GROWTH
Culture: NO GROWTH
Special Requests: ADEQUATE
Special Requests: ADEQUATE

## 2022-03-31 NOTE — Patient Outreach (Signed)
  Care Coordination TOC Note Transition Care Management Follow-up Telephone Call Date of discharge and from where: 03/27/22-[place]  Dx: "syncope" How have you been since you were released from the hospital? Patient voices that she is doing and feeling better. Denies any further syncope episodes. States BP normal. Any questions or concerns? Yes-Patient reports that he has been taking Oxycodone for his knee pain. He has ran out and has no refills. He will call PCP office today to see if MD will order him some more pain pills. He voices he has Tylenol ES that he can take in the meantime.  Items Reviewed: Did the pt receive and understand the discharge instructions provided? Yes  Medications obtained and verified?  Patient was not near his meds Other? Yes  Any new allergies since your discharge? No  Dietary orders reviewed? Yes Do you have support at home? Yes   Home Care and Equipment/Supplies: Were home health services ordered? not applicable If so, what is the name of the agency? N/A  Has the agency set up a time to come to the patient's home? not applicable Were any new equipment or medical supplies ordered?  No What is the name of the medical supply agency? N/A Were you able to get the supplies/equipment? not applicable Do you have any questions related to the use of the equipment or supplies? No  Functional Questionnaire: (I = Independent and D = Dependent) ADLs: I  Bathing/Dressing- I  Meal Prep- I  Eating- I  Maintaining continence- I  Transferring/Ambulation- I  Managing Meds- I  Follow up appointments reviewed:  PCP Hospital f/u appt confirmed? Yes  Scheduled to see Dr. Delford Field on 04/06/21 @ 10:30am. Specialist Hospital f/u appt confirmed?  Patient will call and make appt with ortho    Are transportation arrangements needed? No  If their condition worsens, is the pt aware to call PCP or go to the Emergency Dept.? Yes Was the patient provided with contact  information for the PCP's office or ED? Yes Was to pt encouraged to call back with questions or concerns? Yes  SDOH assessments and interventions completed:   Yes SDOH Interventions Today    Flowsheet Row Most Recent Value  SDOH Interventions   Food Insecurity Interventions Intervention Not Indicated  Transportation Interventions Intervention Not Indicated       Care Coordination Interventions:  Education provided    Encounter Outcome:  Pt. Visit Completed    Antionette Fairy, RN,BSN,CCM [facility] Care Management Telephonic Care Management Coordinator Direct Phone: [phone] Toll Free: [phone] Fax: [phone]

## 2022-04-06 ENCOUNTER — Ambulatory Visit: Payer: Medicare Other | Admitting: Critical Care Medicine

## 2022-04-06 ENCOUNTER — Ambulatory Visit: Payer: Self-pay | Admitting: *Deleted

## 2022-04-06 NOTE — Telephone Encounter (Signed)
Given his history of sepsis from septic joint knee he should go to ED for evaluation

## 2022-04-06 NOTE — Progress Notes (Deleted)
Acute Office Visit  Subjective:     Patient ID: [name], male    DOB: [birth year], 62 y.o.   MRN: [id number]  No chief complaint on file.   HPI This patient is seen today for evaluation of hypertension.   11/08/21  He was seen at the wound care center last week with blood pressures in the 180/130 range.  We had just seen him in June and make changes in his blood pressure medications.  He states he has been compliant with medications as prescribed.  I called him yesterday on the phone and told him to increase his hydralazine to 100 mg 3 times daily.  All other medications are the same.  Below is documentation from the last cardiology visit Aortic stenosis: Echocardiogram 03/2021 showed severe asymmetric LVH, EF 70 to 75%, severe aortic stenosis (Vmax 4 m/s, mean gradient 33 mmHg, AVA 0.9 to 1 cm).  LHC/RHC on 05/27/2021 showed 90% distal LAD stenosis, 90% D2 stenosis, otherwise nonobstructive CAD; RA 14, RV 42/12, PA 44/22/27, PCWP 19. -Previously has been asymptomatic but now reporting dyspnea with exertion.  Seen by [name], planning surgical AVR.  Was scheduled for April 2023, but cancelled as patient had developed worsening lower extremity edema and wounds on legs.  Will need wounds to heal before undergoing surgery.  Found to have bilateral PAD which is likely affecting wound healing.  Underwent angiography on 10/22/2021, and underwent angioplasty to left tibioperoneal trunk and left peroneal artery.   Chronic diastolic heart failure: Echocardiogram on 04/16/2020 showed LVEF 70 to 75%, moderate LVH, grade 2 diastolic dysfunction, moderate to severe aortic stenosis (Vmax 4.0 m/s, mean gradient 38 mmHg, AVA 1.1 cm, DI 0.32).  On Lasix 40 mg thrice daily -Continue lasix.   Will check BMET, magnesium   PAD: Lower extremity duplex 09/02/2021 showed right 50 to 74% stenosis in SFA, bilateral iliac occlusive disease, diminished flow in tibial vessels bilaterally.  Seen by Dr. Scot Dock with [facility].   Underwent angiography on 10/22/2021, and underwent angioplasty to left tibioperoneal trunk and left peroneal artery.   LVH: Severe asymmetric LVH on echo, likely due to severe AS.  Cardiac MRI on 05/03/2021 showed severe LVH measuring up to 20 mm and basal septum (16 mm and posterior wall); no evidence of amyloidosis, and while meets criteria for HCM, likely is secondary to severe aortic stenosis.  There was patchy LGE in septum and RV insertion site accounting for 3% of total myocardial mass.    Atrial fibrillation: in A. fib at prior clinic visit.  Severe biatrial enlargement on echo, may be difficult to obtain rhythm control without antiarrhythmic.  CHA2DS2-VASc score 2 (hypertension, CHF) can just do -Continue Eliquis 5 mg twice daily.  He has also been taking ASA, can discontinue since on Eliquis -Continue Coreg 25 mg twice daily.   -Seen in A-fib clinic, recommended rate control strategy for now while undergoing valve work-up.  If undergoing surgical AVR, could undergo MAZE procedure   Resistant hypertension: on carvedilol 25 mg twice daily, clonidine 0.2 mg 3 times daily, Lasix 40 mg thrice daily, hydralazine 50 mg 3 times daily, olmesartan 40 mg daily.  Work-up for secondary causes includes no evidence of renal artery stenosis on duplex.  Elevated aldosterone/renin ratio but normal aldosterone level argues against hyperaldosteronism.  Normal TSH.  Sleep study shows severe OSA, suspect this is contributing.  Started on CPAP.  Was on amlodipine but discontinued due to edema -BP elevated.  Will check BMET, if stable plan  to add spironolactone.  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  Denies any abdominal pain or unintentional weight loss.  Continue to monitor.   Hyperlipidemia: On atorvastatin 20 mg daily.  LDL 46 on 08/23/2021   OSA:  Severe OSA on sleep study, starting on CPAP.   QT prolongation: QTC 500 at prior clinic visit, most recent EKG with QTC 469, will continue to monitor     RTC in 6 weeks   The patient does have aortic stenosis but cannot have aortic valve replacement until his medical condition stabilizes the patient is had peripheral artery disease treated already   With the peripheral artery disease he has had stents placed in the left leg.  Wounds are improving from this.  Patient does note some shortness of breath with exertion.  There is no other complaints at this time.  8/22 Patient is seen in return follow-up blood pressure on arrival the best we have seen it 19/77.  Patient is feeling better wounds and lower extremity are improved.  He has less edema.  He just saw the cardiology pharmacist recently and below is documentation from that visit.  [name] has been added.  Patient also needs repeat labs at this visit including magnesium  Saw Cards CPP Pavero one day after seen by PCP on 8/8: 1. CHF -  BP in room today 126/79 which is at goal of <130/80.  Due for labs but will postpone for 1 week since he had not decreased his magnesium yet. Will also be able to assess if he can tolerate spironolactone.     Will add Farxiga at this time for HFpEF benefit and continue other HTN medications. Has follow up with Dr Gardiner Rhyme in 1 month.   Start Farxiga 63m daily Continue: Spironolactone 523mdaily Olmesartan 4028maily Furosmide 28m74mD Carvedilol 25mg87m Hydralazine 100mg 81mCheck BMP in 1 week Recheck with Dr SchumaGardiner Rhymeweeks   Chris Karren CobblemD, BCACP, CDCES, CPP  Patient has follow-up visit with vascular surgery this week then sees cardiology again in 3 weeks he has a CPAP titration study middle of September  04/06/22 4 admits since I last saw pt 1st Admit date:     01/01/2022  Discharge date: 01/06/22  Discharge Physician: ChristEdwin DadaP: WrightElsie Stain       Recommendations at discharge:  Follow up with PCP Dr. WrightJoya Gaskinsweek Dr. WrightJoya Gaskinsse obtain BMP and CBC in 5 days and restart ARB, spironolactone if K normalized Follow up with Orthopedics Dr. MurphyPercell Miller7 days for gout flare Follow up with Dr. BartleCyndia Bentt 25 for aortic stenosis Dr. WrightJoya Gaskinsse recheck iron level in 4-6 weeks and refer to GI for iron deficiency anemia if indicated           Discharge Diagnoses: Principal Problem:   Sepsis due to group A Streptococcus with acute renal failure without septic shock (HCC) [place] Problems:   HTN (hypertension)   Chronic diastolic heart failure (HCC)   Stage 3a chronic kidney disease (CKD) (HCC)  [place] (obstructive sleep apnea)   Persistent atrial fibrillation (HCC)   AKI (acute kidney injury) (HCC)  [place] (BMI 30-39.9)   Iron deficiency anemia   Gout of right knee  Hospital Course: [name] is a 62 y.o. M with dCHF, AF on Eliquis, CKD IIIb baseline 1.3-1.4, chronic pain, HTN, HLD, and OSA not on CPAP who presented with weakness, fever and rigors.   Found to have group A strep bacteremia and gout of the right knee.         * Sepsis due to group A Streptococcus with acute renal failure without septic shock ([place]) Presented with leukocytosis, fever, and acute kidney injury, Cr >2.  Blood cultures growing group A streptococcus.    ID consulted.  Recent echo without vegetation.  Treated with penicillin and linezolid added for toxin suppression.   Transitioned to oral amoxicillin and ID recommended 14 days total therapy.          Gout of right knee Patient developed severe pain and swelling of the right knee prior to admission.  There was some consideration of septic arthritis and the joint was aspirated, but this had MSU only and culture negative.    Repeat aspiration done on 10/4 and injection of steroid performed.     Given carvedilol and renal function, colchicine contraindicated.  Given few days  prednisone before instillation of steroids, which should be adequate treatment.  Improving symptoms and able to transfer, take stairs, and ambulate 150 feet with PT on day of discharge.      Has close follow up    Iron deficiency anemia Hemoglobin ~9 g/dL, no blood loss.  Microcytic and iron saturation low.  Given IV iron.     Recommend CBC in 1 week, oral iron and follow up with PCP        AKI (acute kidney injury) ([place]) 2.4 on admission, improved with fluids.  Resolved to baseline     Persistent atrial fibrillation (HCC) HR normalized.   Aortic stenosis [name] reviewed case, recommended outpatient follow up Oct 25 after resolution of bacteremia.   Stage 3a chronic kidney disease (CKD) (HCC) Baseline 1.3-1.4.  CKD IIIb ruled out.      Chronic diastolic heart failure (HCC) Appears euvolemic.  Acute heart failure ruled out. Diuretic resumed at discharge but spironolactone and ArB held given hyperkalemia                     The [place] was reviewed for this patient prior to discharge.    Consultants: Orthopedics, Infectious disease Procedures performed: Knee aspiration x2  Disposition: Home health, patient declined Diet recommendation:  Discharge Diet Orders (From admission, onward)        Start     Ordered    01/06/22 0000   Diet - low sodium heart healthy        01/06/22 1236                    DISCHARGE MEDICATION: Allergies as of 01/06/2022     Second admit Expand All Collapse All  Physician Discharge Summary  [name] WUJ:[id number] DOB: June 05, 1960 DOA: 01/14/2022   PCP: [name], MD   Admit date: 01/14/2022 Discharge date: 01/25/2022   Time spent: 40 minutes   Recommendations for Outpatient Follow-up:  Follow outpatient CBC/CMP  Follow with cardiothoracic surgery outpatient Follow with infectious disease outpatient Follow with CT surgery outpatient Follow with orthopedics  outpatient Attention to blood pressure and volume outpatient - adjust BP meds and lasix as needed Holding statin until dapto complete Adjust allopurinol outpatient prn Needs vascular follow up for 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 male with a history of severe AS, HFpEF, AFib on eliquis, stage IIIa CKD, HTN, HLD, OSA not on CPAP who presented for the 2nd time in 2 days on 10/13 with increasing right knee pain. He had been admitted 9/30 with group A streptococcal bacteremia, ARF and RLE cellulitis as well as suspected gout of the right knee treated with steroid injection. With negative echo, penicillin and linezolid were given with improvement, transitioned to amoxicillin for 14 days per ID and discharged 10/5. He returned ultimately 10/13 with worsening pain and lethargy. He was septic with hypotension responsive to resuscitation, leukocytosis (WBC 14), AKI (SCr 2.4 from 1.5). IV antibiotics given, cultures drawn, and ultimately MR right knee revealed mod-large complex effusion, synovial thickening, lateral femoral condyle focal erosion, minimal adjacent marrow edema, extensive muscular edema, quadriceps tendinosis with partial-thickness tear. 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  Surgical cultures ended up growing coag negative staph.  He was transitioned to daptomycin to treat septic arthritis.  Plan for outpatient IV abx and ID follow up.  Hospitalization complicated by orthostasis which has improved with reducing BP meds.   Hospital Course:  Assessment and Plan: Severe sepsis with  AKI  Recent Group A Strep Bacteremia and Right Leg Cellulitis Septic Arthritis of R Knee:  - With recent history of S. pyogenes bacteremia, plan was oral amoxicillin 1g q8h through 10/15. Will request ID input regarding this.  - appreciate ID recs, recommending treatment for presumed septic arthritis of R knee -> PICC, IV daptomycin  - outpatient ID follow up   CoNS Septic Arthritis of Right Knee (staph cohnii and staph auricularis)  - s/p arthrotomy R knee with closure over drain and application of biologic graft using kerecis to the right leg per orthopedics 10/16. 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  - rare staph cohnii from synovial tissue, staph auricularis -> abx changed to daptomycin x4 weeks - will need ID follow up outpatient   Tophaceous Gout Flare - will start allopurinol with crystals seen on synovial fluid  - follow with orthopedics outpatient    Nausea  Dyspnea on Exertion  Orthostatic Hypotension  - orthostasis when working with therapy, HR up to 140 with activity, though BP apparently low but without fall - will hold lasix, drop dose of clonidine, hydralazine, and coreg - CXR without acute pulm process - ? If symptoms related to aortic stenosis - appreciate cardiology assistance - overall seems improved   AKI on stage IIIa CKD:  - Avoid nephrotoxins.    Persistent AFib with RVR: Rate stabilized - Continue coreg, cardizem - eliquis    HTN  Chronic HFpEF  Aortic stenosis: - Coreg dose halved to 12.15 mg BID, benicar 40 mg, diltiazem 59m q6h, clonidine dose halved to 0.1 mg TID, hydralazine dose halved to 25 mg TID- appreciate cardiology assistance  - holding lasix at discharge, resume prn dosing at home - BNP down from prior, required IVF resuscitation, will avoid hypotension and monitor I/O, volume status closely  - Follow up  with [name] after all infection has been treated to pursue AVR. Also discussing MAZE. Has a follow up  appointment tomorrow with CT surgery.   CAD: No angina. Plan per cardiology.    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  Arthrotomy, right knee with closure over drain. 2.  Application of biologic graft using Kerecis to the right leg.   LE [place] Summary:  RIGHT:  - There is no evidence of deep vein thrombosis in the lower extremity.  Appearance of fluid collection in the joint space on the medial aspect of  the knee.     LEFT:  - No evidence of common femoral vein obstruction.       Third 02/2022 atient: [name] MRN: [id number] DOB: 08-Dec-1960  Admit date:     02/17/2022  Discharge date: 02/20/22  Discharge Physician: Patrecia Pour    PCP: [name], MD    Recommendations at discharge:  Follow up with PCP in 1-2 weeks for recheck of vital signs, ECG, BMP Follow up with cardiology as arranged for 12/5.  Note multiple medication changes to improve control of AFib heart rate and resistant HTN. Coreg, hydralazine increased, lasix restarted, diltiazem started, and olmesartan, clonidine, and farxiga continued. Spironolactone held pending follow up. Continue follow up with CT surgery for eventual operative management of AS, CAD.   Discharge Diagnoses: Active Problems:   Hypertensive urgency   Dizziness   SIRS (systemic inflammatory response syndrome) (HCC)   Atrial fibrillation with RVR (HCC)   Acute on chronic diastolic (congestive) heart failure (HCC)   Urinary tract infection   Abnormal chest x-Yim   Nausea and vomiting   CKD (chronic kidney disease), stage III (HCC)   Severe aortic stenosis   Iron deficiency anemia   CAD in native artery   PAD (peripheral artery disease) (HCC)   History of CVA (cerebrovascular accident)   History of septic arthritis   Hospital Course: [name] is a 62 y.o.  male with medical history significant of hypertension, hyperlipidemia, diastolic congestive heart failure, atrial fibrillation on chronic anticoagulation, aortic stenosis, CKD stage IIIa, gout, and OSA not on CPAP who presents with complaints of feeling off balance with nausea and vomiting starting 3 days ago.  History is obtained mostly from the patient's wife who is present at bedside.   Patient been admitted and treated in September with Streptococcus bacteremia.  Admitted again in October with CoNS septic arthritis of the right knee with cultures growing staph cohnii and auricularis.  Patient had PICC line placed and was transition from penicillin to daptomycin through 11/21.  ID switched daptomycin to linezolid on 11/10.  PICC line was discontinued 11/15.  He had been taking the linezolid as instructed until his a follow-up appointment on 11/15 where he was told to stop antibiotics.  Ever since he stopped the IV antibiotics he is not felt well.  Report subjective fevers at home.  He has been having pain in his right leg which have been wrapped up until his office appointment with ID the other day when it was unwrapped.  He had developed a blister on the posterior part of the right leg.  Notes that its been weeping fluid and he has had associated swelling.  Denies any redness.  His wife notes that his blood pressures have also been significantly elevated in the 200s.  During the prior hospitalization he had  hypotension for which his blood pressure medications were decreased.  Patient reports that he has been feeling dizzy especially with getting up and feels short of breath even walking a few feet.  Denied any significant cough.  She also reports that the patient had been confused and saying things that did not make sense with slurred speech for which she question him having a stroke.  He has been taking his medications, but with nausea and vomiting his wife is unsure of what things stayed down.  There were  plans for him to have surgery to correct his aortic stenosis by Dr. Caffie Pinto, but has been postponed due to his acute infection.   In the emergency department patient was noted to be afebrile with heart rates elevated up to 134 in atrial fibrillation, mild tachypnea, and blood pressures elevated up to 213/135.  Labs revealed WBC 3.3, hemoglobin 10.1 low MCV/MCH, potassium 5, BUN 25, creatinine 1.96, and INR 1.9.  Chest x-Lamore noted a left lobe opacity concerning for atelectasis or developing pneumonia.  CT scan of the head and CTA of the head and neck did not reveal any acute intracranial abnormality or large vessel occlusion.  MRI of the brain revealed no acute intracranial abnormality with multiple chronic cerebellar microhemorrhages, and prior old right basal ganglia infarct with chronic small vessel ischemia.  Influenza and COVID-19 screening were negative.  Patient was given IV fluids, antiemetics, started on Cardizem drip, and clonidine 0.1 mg p.o. Medications were adjusted with improved rate control and resolution of HTN urgency. His symptoms have also improved.    Please see problem-oriented plan below.   Assessment and Plan: Dizziness Hypertensive urgency: Dizziness has resolved, not orthostatic by HR/BP nor symptoms when up with therapy. BP under improved control with medication adjustments below.  Patient presents with complaints of feeling off balance with reports of nausea and vomiting.  On admission blood pressures elevated up to 213/135 and was found to be in atrial fibrillation. MRI of the brain was negative for any acute abnormality and revealed old right basal ganglia infarct. - Continue coreg 16m BID, hydralazine 72mq8h. Continue clonidine 0.6m56mID. Irbesartan 150m44mstarted (formulary equivalent of home ARB) and Cr improved, BP improving.  - Continue monitoring. BPs overall much better.    Sepsis due to UTI: Patient reports having subjective fevers at home and was found to be  tachycardic and with WBC 3.3 on admission. Blood cultures NGTD at discharge, urine culture pending. Urinalysis noted small hemoglobin with many bacteria and 6-10 WBCs per hpf.  Possibly reason for patient's vomiting, and malaise. - Continued CTX with improved symptoms, will DC on keflex.    Atrial fibrillation with RVR:  - Rates much improved.  - Converted dilt gtt to po - Continue eliquis    Acute on chronic HFpEF: Remains volume overloaded - Continue lasix 40mg4mhome, hold spiro for now. - Continue SGLT2i - Restarted ARB, and BP meds as above.  - Will need weight remeasured today prior to discharge.    Urinary tract infection:  - Urine culture pending. On CTX as below.   Abnormal chest x-Sedlacek Acute.  Chest x-Schneeberger noted a left lower lobe opacity.  Patient has been having episodes of nausea and vomiting. No reports of cough or aspiration. PCT not significantly elevated.  - Empiric CTX given at admission - Recheck CXR intermittently.  Nausea and vomiting: Patient has been having episodes of nausea and vomiting related to feeling off balance. - Aspiration precautions - Elevation head of the bed - Diet as tolerated, advance today per pt preference, tolerance - Antiemetics as needed - Protonix 40 mg IV     Chronic kidney disease stage IIIa: With mild creatinine bump POA that has improved back to his baseline. CrCl at baseline appears to be 45-2m/min.  - Continue to monitor kidney function at follow up. Hold spiro for now.   Severe aortic stenosis: Patient has significant aortic stenosis for which plans are for replacement with Dr. BCyndia Bent but has been postponed due to recent infections. - Echo 11/18 with gradients similar to TTE 12/28/2021. - Continue outpatient follow-up with Dr. BCyndia Bent    Iron deficiency anemia: Iron 17, TIBC 178, and ferritin 531 on 10/3. - Continue iron supplement   CAD: Patient has obstructive coronary disease for which he was considered for CABG, but a  good candidate due to infection time. No current angina.  -Continue aspirin, statin, and beta-blocker   PAD: History of angioplasty of the left tibial peroneal trunk and left peroneal artery in 10/2021.  He has known SMA stenosis. - Continue aspirin and statin   History of CVA: MRI noted for prior lacunar infarct in right basal ganglia infarct. -Continue statin and eliquis   History of bacteremia with right knee septic arthritis Patient had been recently treated and completed IV antibiotics daptomycin  for CoNS septic arthritis of the right knee with cultures growing staph cohnii and auricularis.    He had been treated with p.o. linezolid which was started on 11/10 and stopped on 11/15 after his follow-up appointment with ID.  PICC line was also removed on 11/15. - CRP negative, ESR 18, PCT 0.17. Knee clinically much improved.   Second 03/24/22:  Admit date:     03/24/2022  Discharge date: 03/27/22  Discharge Physician: SDeatra James   PCP: WElsie Stain MD    Recommendations at discharge:  Follow-up with orthopedic team within 1 week-  Follow-up with aspirated cultures from the knee with orthopedic team and appropriate antibiotics if needed Follow-up with your PCP within 1-2 weeks, your current medication is subject to change Monitor your blood pressure at 1-2 times a day, keep a record and report to PCP for adjustment of your current medications Per ID:: Was given a dose of oritavancin followed by 2 wk of linezolid which would give MRSA coverage as well. Plan to start linezolid on jan 7th.   Discharge Diagnoses: Principal Problem:   Syncope Active Problems:   Hypertensive urgency   Persistent atrial fibrillation (HCC)   (HFpEF) heart failure with preserved ejection fraction (HCC)   HTN (hypertension)   Chronic kidney disease, stage 3a (HCC)   Severe aortic stenosis   CAD in native artery   PAD (peripheral artery disease) (HCC)   History of CVA (cerebrovascular  accident)   Pain and swelling of right knee   OSA (obstructive sleep apnea)   Septic arthritis of knee, right (HCC)   Hypomagnesemia   Resolved Problems:   * No resolved hospital problems. *   Hospital Course: 62y.o. male with medical history significant for HFpEF (EF 55%), CAD, persistent A-fib/flutter on Eliquis, severe aortic stenosis, history of CVA, PAD, CKD stage IIIa, HTN, HLD, OSA, recent strep bacteremia and septic arthritis of the right knee earlier this year who is admitted for evaluation of syncope.   Assessment and Plan: *  Syncope Admitted after syncopal event without prodrome.  This occurred after he stood up.  Differential is broad including orthostasis, hypotension, cardiogenic given underlying arrhythmia with hypomagnesemia, or secondary to known severe aortic stenosis. -Modifying BP meds -Check orthostatic vitals -Magnesium supplemented -TTE 02/19/2022 showed EF 55%, severe concentric LVH, severe aortic stenosis -Check UA, UDS -PT/OT eval-- no need for f/up.    Persistent atrial fibrillation ([place]) Returning in atrial fibrillation.  Mildly bradycardic on arrival. -Diltiazem  on hold  -Continue Eliquis -Restarting Coreg today     Septic arthritis  - recent hospitalization for such. Had strep bacteremia in September, then admitted in October with CoNS septic arthritis of right knee with cultures growing staph sohnii and auricularis. Had picc, treated with pcn to daptomycin to linezolid. Completed abx on 11/15.  Has sutures in right knee from prior operative debridement and today frank pus is coming from that surgical incision site. Consulted orthopedics and ID and.      03/27/22 - underwent knee aspirate which showed WBC on gram stain, 67% neutrophils. Unable to do cell count since specimen clotted.    Plan per ID  Treat as septic arthritis, and follow up cultures to see if needs I x D. - will give a dose of oritavancin followed by 2 wk of linezolid which would  give MRSA coverage as well. Plan to start linezolid on jan 7th.           (HFpEF) heart failure with preserved ejection fraction (HCC) - Currently stable - Euvolemic to mildly hypovolemic on admission.  Last EF 55% on 02/19/2022. -was Holding Lasix, Coreg, [name] given syncopal event with borderline hypotension and bradycardia Improved bradycardia and hypotension --restarting Coreg and  Farxiga today 03/27/2022   HTN (hypertension) History of resistant hypertension on multiple antihypertensives.  These are on hold given syncopal event with borderline hypotension.   -BP has improved, restarting  beta-blockers   Chronic kidney disease, stage 3a ([place]) Renal function stable, GFR improved from baseline.      Lab Results  Component Value Date    CREATININE 1.34 (H) 03/27/2022    CREATININE 1.44 (H) 03/26/2022    CREATININE 0.89 03/25/2022        Severe aortic stenosis Known severe aortic stenosis.  He has been evaluated by CT surgery and cardiology to determine whether to proceed with surgical AVR or TAVR.   CAD in native artery -Continue denies any chest pain -No changes in EKG -Continue aspirin and atorvastatin.   History of CVA (cerebrovascular accident) Continue aspirin, Eliquis, statin.   Hypomagnesemia IV supplement given.   -Monitoring closely   OSA (obstructive sleep apnea) Sleep study shows severe OSA.  Not sure if he has received CPAP/BiPAP yet.   Toc visit RN Care Coordination [facility] Note Transition Care Management Follow-up Telephone Call Date of discharge and from where: 03/27/22-[facility]  Dx: "syncope" How have you been since you were released from the hospital? Patient voices that she is doing and feeling better. Denies any further syncope episodes. States BP normal. Any questions or concerns? Yes-Patient reports that he has been taking Oxycodone for his knee pain. He has ran out and has no refills. He will call PCP office today to see if MD will order  him some more pain pills. He voices he has Tylenol ES that he can take in the meantime.   Items Reviewed: Did the pt receive and understand the discharge instructions provided? Yes  Medications obtained and verified?  Patient was not near  his meds Other? Yes  Any new allergies since your discharge? No  Dietary orders reviewed? Yes Do you have support at home? Yes    Home Care and Equipment/Supplies: Were home health services ordered? not applicable If so, what is the name of the agency? N/A  Has the agency set up a time to come to the patient's home? not applicable Were any new equipment or medical supplies ordered?  No What is the name of the medical supply agency? N/A Were you able to get the supplies/equipment? not applicable Do you have any questions related to the use of the equipment or supplies? No   Functional Questionnaire: (I = Independent and D = Dependent) ADLs: I   Bathing/Dressing- I   Meal Prep- I   Eating- I   Maintaining continence- I   Transferring/Ambulation- I   Managing Meds- I   Follow up appointments reviewed:   PCP Hospital f/u appt confirmed? Yes  Scheduled to see Dr. Joya Gaskins on 04/06/21 @ 10:30am. [facility] f/u appt confirmed?  Patient will call and make appt with ortho    Are transportation arrangements needed? No  If their condition worsens, is the pt aware to call PCP or go to the Emergency Dept.? Yes Was the patient provided with contact information for the PCP's office or ED? Yes Was to pt encouraged to call back with questions or concerns? Yes HTN (hypertension) - Primary (Chronic)   Markedly improved control continue current medications  Spironolactone 42m daily Olmesartan 448mdaily Furosmide 404mID Carvedilol 40m9mD Hydralazine 100mg37m     Relevant Orders  BMP8+eGFR  Severe aortic stenosis   We need to determine timing of referral back to cardiothoracic surgery for aortic valve replacement  We will confer with  cardiology     Peripheral artery disease (HCC) Gardineranagement per vascular surgery     Respiratory  OSA (obstructive sleep apnea)   Has CPAP titration study upcoming in September     Genitourinary  Stage 3b chronic kidney disease (HCC) Bradfordssess metabolic panel and magnesium     Relevant Orders  BMP8+eGFR  Other  Hyperlipemia   Continue current statin therapy     Magnesium deficiency   Assess magnesium level     Relevant Orders  Magnesium   Review of Systems  Constitutional:  Negative for chills, diaphoresis, fever, malaise/fatigue and weight loss.  HENT:  Negative for congestion, hearing loss, nosebleeds, sore throat and tinnitus.   Eyes:  Negative for blurred vision, photophobia and redness.  Respiratory:  Negative for cough, hemoptysis, sputum production, shortness of breath, wheezing and stridor.   Cardiovascular:  Negative for chest pain, palpitations, orthopnea, claudication, leg swelling and PND.  Gastrointestinal:  Negative for abdominal pain, blood in stool, constipation, diarrhea, heartburn, nausea and vomiting.  Genitourinary:  Negative for dysuria, flank pain, frequency, hematuria and urgency.  Musculoskeletal:  Negative for back pain, falls, joint pain, myalgias and neck pain.  Skin:  Negative for itching and rash.  Neurological:  Negative for dizziness, tingling, tremors, sensory change, speech change, focal weakness, seizures, loss of consciousness, weakness and headaches.  Endo/Heme/Allergies:  Negative for environmental allergies and polydipsia. Does not bruise/bleed easily.  Psychiatric/Behavioral:  Negative for depression, memory loss, substance abuse and suicidal ideas. The patient is not nervous/anxious and does not have insomnia.         Objective:    There were no vitals taken for this visit.   Physical Exam Vitals reviewed.  Constitutional:  Appearance: Normal appearance. He is well-developed. He is not diaphoretic.  HENT:     Head:  Normocephalic and atraumatic.     Nose: No nasal deformity, septal deviation, mucosal edema or rhinorrhea.     Right Sinus: No maxillary sinus tenderness or frontal sinus tenderness.     Left Sinus: No maxillary sinus tenderness or frontal sinus tenderness.     Mouth/Throat:     Pharynx: No oropharyngeal exudate.  Eyes:     General: No scleral icterus.    Conjunctiva/sclera: Conjunctivae normal.     Pupils: Pupils are equal, round, and reactive to light.  Neck:     Thyroid: No thyromegaly.     Vascular: No carotid bruit or JVD.     Trachea: Trachea normal. No tracheal tenderness or tracheal deviation.  Cardiovascular:     Rate and Rhythm: Normal rate and regular rhythm.     Chest Wall: PMI is not displaced.     Pulses: Normal pulses. No decreased pulses.     Heart sounds: Normal heart sounds, S1 normal and S2 normal. Heart sounds not distant. No murmur heard.    No systolic murmur is present.     No diastolic murmur is present.     No friction rub. No gallop. No S3 or S4 sounds.  Pulmonary:     Effort: No tachypnea, accessory muscle usage or respiratory distress.     Breath sounds: No stridor. No decreased breath sounds, wheezing, rhonchi or rales.  Chest:     Chest wall: No tenderness.  Abdominal:     General: Bowel sounds are normal. There is no distension.     Palpations: Abdomen is soft. Abdomen is not rigid.     Tenderness: There is no abdominal tenderness. There is no guarding or rebound.  Musculoskeletal:        General: Normal range of motion.     Cervical back: Normal range of motion and neck supple. No edema, erythema or rigidity. No muscular tenderness. Normal range of motion.  Lymphadenopathy:     Head:     Right side of head: No submental or submandibular adenopathy.     Left side of head: No submental or submandibular adenopathy.     Cervical: No cervical adenopathy.  Skin:    General: Skin is warm and dry.     Coloration: Skin is not pale.     Findings: No  rash.     Nails: There is no clubbing.  Neurological:     Mental Status: He is alert and oriented to person, place, and time.     Sensory: No sensory deficit.  Psychiatric:        Speech: Speech normal.        Behavior: Behavior normal.     No results found for any visits on 04/06/22.      Assessment & Plan:   Problem List Items Addressed This Visit   None No orders of the defined types were placed in this encounter.  No follow-ups on file.  [name], MD

## 2022-04-06 NOTE — Telephone Encounter (Signed)
Summary: fever of 102.00/ headache   Pt stated he has a fever of 102.00, has a headache, and just isn't feeling well. Pt had an appointment for this morning. I advised pt I could change to a phone call pt declined and stated he didn't want an appointment and asked me to cancel it.  Pt seeking clinical advice.       Chief Complaint: Fever Symptoms: 102.0 fever, body aches Frequency: Last night Pertinent Negatives: Patient denies cough, congestion Disposition: [x] ED /[] Urgent Care (no appt availability in office) / [] Appointment(In office/virtual)/ []  [facility]/ [] Home Care/ [] Refused Recommended Disposition /[] [facility]/ []  Follow-up with PCP Additional Notes: Pt had knee surgery with I/D, is on antibiotics. Seen 12/21 23 ED for syncope. Pt states "Just don't feel good." Advised ED with history, "Maybe when I feel better." Unsure will follow disposition. States will home test covid this afternoon. Sent message to Carilyn Goodpasture to alert.  Reason for Disposition  Patient sounds very sick or weak to the triager  (Exception: Mild weakness and hasn't taken fever medicine.)  Answer Assessment - Initial Assessment Questions 1. TEMPERATURE: "What is the most recent temperature?"  "How was it measured?"      102.0 2. ONSET: "When did the fever start?"      Last night 3. CHILLS: "Do you have chills?" If yes: "How bad are they?"  (e.g., none, mild, moderate, severe)   - NONE: no chills   - MILD: feeling cold   - MODERATE: feeling very cold, some shivering (feels better under a thick blanket)   - SEVERE: feeling extremely cold with shaking chills (general body shaking, rigors; even under a thick blanket)      Mild chills, sweating 4. OTHER SYMPTOMS: "Do you have any other symptoms besides the fever?"  (e.g., abdomen pain, cough, diarrhea, earache, headache, sore throat, urination pain)     Body aches 5. CAUSE: If there are no symptoms, ask: "What do you think is causing the  fever?"       6. CONTACTS: "Does anyone else in the family have an infection?"     No 7. TREATMENT: "What have you done so far to treat this fever?" (e.g., medications)     Tylenol 8. IMMUNOCOMPROMISE: "Do you have of the following: diabetes, HIV positive, splenectomy, cancer chemotherapy, chronic steroid treatment, transplant patient, etc." Yes, knee surgery with infection  Protocols used: Fever-A-AH

## 2022-04-07 ENCOUNTER — Encounter: Payer: Self-pay | Admitting: Surgery

## 2022-04-07 ENCOUNTER — Ambulatory Visit (INDEPENDENT_AMBULATORY_CARE_PROVIDER_SITE_OTHER): Payer: Medicare Other | Admitting: Surgery

## 2022-04-07 VITALS — BP 133/80 | HR 83 | Resp 20 | Ht 69.0 in | Wt 185.0 lb

## 2022-04-07 DIAGNOSIS — I350 Nonrheumatic aortic (valve) stenosis: Secondary | ICD-10-CM

## 2022-04-07 NOTE — Progress Notes (Signed)
HPI:  Mr. Ogan returns today for follow-up of his severe aortic stenosis.  He has a 62 year old gentleman with a history of hypertension, hyperlipidemia, chronic diastolic heart failure, OSA on CPAP, atrial fibrillation, stage III chronic kidney disease, and moderate malnutrition who I had evaluated for open surgical treatment of his aortic stenosis with bypass of his diagonal branch and Maze procedure for atrial fibrillation.  I initially saw him in March 2023 and he was having acute problems with his right knee.  He developed lower extremity wounds related to swelling.  He subsequently underwent left tibioperoneal trunk angioplasty and left peroneal artery angioplasty by Dr. Lenell Antu on 10/22/2021.  When I saw him in September 2023 his lower extremity wounds had healed and I thought it may be time to proceed with open surgery.  He was scheduled for a follow-up echocardiogram.  Unfortunately he was readmitted on 01/14/2022 with severe sepsis and acute kidney injury and diagnosed with group A strep bacteremia with right leg cellulitis and septic arthritis of the right knee. He had to have his right knee opened and drained by orthopedic surgery with application of a biologic graft using Kerecis on 01/17/2022.  Cultures grew rare staph cohnii from the synovial tissue and staph auricularis.  His antibiotics were changed to daptomycin for a 4-week course.  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  He was continued on antibiotic treatment for septic knee arthritis.  Given his complicated course and multiple comorbidities with a general decline in his condition I felt that open surgery would be very high risk for him.  He was discussed at our multidisciplinary heart valve meeting and we felt that TAVR was a reasonable option for treating his severe aortic stenosis.  He completed his antibiotic treatment on 02/16/2022.  He was scheduled for a gated cardiac CTA for further  evaluation.  Unfortunately he was readmitted on 03/24/2022 with syncope.  He had sutures in the right knee from his prior operative debridement and those were removed with frank pus draining from the incision site.  He underwent knee aspiration on 03/27/2022 and was seen by infectious disease and continued on treatment for septic arthritis.  He is currently on a 28-day course of linezolid.  He reports having a temperature of 102 degrees yesterday with some headache.  He talked to the triage nurse at the PCP office and it was recommended that he go to the emergency room which he did not do.  He said when he woke up this morning he felt fine with no further fever.  He denies any further headache.  He denies cough or upper respiratory symptoms.  He is wearing a mask for this visit. Current Outpatient Medications  Medication Sig Dispense Refill   acetaminophen (TYLENOL) 500 MG tablet Take 500 mg by mouth 3 (three) times daily as needed for moderate pain.     aspirin EC 81 MG tablet Take 81 mg by mouth daily. Swallow whole.     atorvastatin (LIPITOR) 20 MG tablet Take 1 tablet (20 mg total) by mouth daily. Resume this after your antibiotics are complete (Patient taking differently: Take 20 mg by mouth daily.)     carvedilol (COREG) 25 MG tablet Take 1 tablet (25 mg total) by mouth 2 (two) times daily with a meal. 60 tablet 0   diltiazem (CARDIZEM CD) 240 MG 24 hr capsule Take 1 capsule (240 mg total) by mouth daily. 30 capsule 0   FARXIGA 10  MG TABS tablet Take 10 mg by mouth daily.     ferrous sulfate 325 (65 FE) MG EC tablet Take 1 tablet (325 mg total) by mouth every other day. 15 tablet 3   gabapentin (NEURONTIN) 400 MG capsule Take 2 capsules (800 mg total) by mouth 3 (three) times daily. (Patient taking differently: Take 800 mg by mouth 3 (three) times daily as needed (pain).) 540 capsule 0   linezolid (ZYVOX) 600 MG tablet Take 1 tablet (600 mg total) by mouth every 12 (twelve) hours for 28 days. 56  tablet 0   magnesium oxide (MAG-OX) 400 MG tablet Take 1 tablet (400 mg total) by mouth every other day. 90 tablet 3   olmesartan (BENICAR) 40 MG tablet Take 40 mg by mouth daily.     omeprazole (PRILOSEC) 40 MG capsule Take 1 capsule (40 mg total) by mouth daily. 90 capsule 3   potassium chloride SA (KLOR-CON M) 20 MEQ tablet Take 1 tablet (20 mEq total) by mouth daily as needed. When you take your lasix (Patient taking differently: Take 20 mEq by mouth every other day.)     allopurinol (ZYLOPRIM) 100 MG tablet Take 1 tablet (100 mg total) by mouth daily. 30 tablet 1   apixaban (ELIQUIS) 5 MG TABS tablet Take 1 tablet (5 mg total) by mouth 2 (two) times daily. (Patient not taking: Reported on 04/07/2022) 180 tablet 3   furosemide (LASIX) 40 MG tablet Take 1 tablet (40 mg total) by mouth daily. (Patient not taking: Reported on 04/07/2022) 30 tablet 0   No current facility-administered medications for this visit.     Physical Exam: BP 133/80 (BP Location: Right Arm, Patient Position: Sitting, Cuff Size: Normal)   Pulse 83   Resp 20   Ht 5\' 9"  (1.753 m)   Wt 185 lb (83.9 kg)   SpO2 98%   BMI 27.32 kg/m  He is in no distress Cardiac exam shows an irregular rate and rhythm with a 3/6 systolic murmur along the right sternal border. Lungs are clear. There is mild bilateral lower extremity edema.  Diagnostic Tests:  ADDENDUM REPORT: 02/15/2022 16:21   EXAM: OVER-READ INTERPRETATION  CT CHEST   The following report is an over-read performed by radiologist Dr. Maudry [facility] on 02/15/2022. This over-read does not include interpretation of cardiac or coronary anatomy or pathology. The coronary CTA interpretation by the cardiologist is attached.   COMPARISON:  None.   FINDINGS: Vascular: Mild cardiomegaly. No pericardial effusion. Normal caliber thoracic aorta with mild atherosclerotic disease. No suspicious filling defects of the central pulmonary  arteries.   Mediastinum/Nodes: Esophagus unremarkable. No pathologically enlarged lymph nodes seen in the chest.   Lungs/Pleura: Central airways are patent. Mild paraseptal emphysema. No consolidation, pleural effusion or pneumothorax. Mild left-greater-than-right bibasilar atelectasis   Upper Abdomen: No acute abnormality.   Musculoskeletal: No chest wall mass or suspicious bone lesions identified.   IMPRESSION: 1. No acute extracardiac abnormality. 2. Aortic Atherosclerosis (ICD10-I70.0) and Emphysema (ICD10-J43.9).     Electronically Signed   By: Yetta Glassman M.D.   On: 02/15/2022 16:21    Addended by Dena Billet, MD on 02/15/2022  4:23 PM  ADDENDUM REPORT: 02/08/2022 14:06     Electronically Signed   By: Jenkins Rouge M.D.   On: 02/08/2022 14:06    Addended by Josue Hector, MD on 02/08/2022 10:45 PM    Study Result  Narrative & Impression  CLINICAL DATA:  Aortic Stenosis  EXAM: Cardiac TAVR CT   TECHNIQUE: The patient was scanned on a Siemens Force 706 slice scanner. A 120 kV retrospective scan was triggered in the ascending thoracic aorta at 140 HU's. Gantry rotation speed was 250 msecs and collimation was .6 mm. No beta blockade or nitro were given. The 3D data set was reconstructed in 5% intervals of the R-R cycle. Systolic and diastolic phases were analyzed on a dedicated work station using MPR, MIP and VRT modes. The patient received 80 cc of contrast.   FINDINGS: Aortic Valve: Tri leaflet calcified with score 3484   Aorta: Mild calcific atherosclerosis Bovine arch mild ascending thoracic aorta dilatation   Sino-tubular Junction: 32 mm mild calcium   Ascending Thoracic Aorta: 38 mm   Aortic Arch: 3.0 cm   Descending Thoracic Aorta: 28 mm   Sinus of Valsalva Measurements:   Non-coronary: 36.2 mm   Right - coronary: 32.5 mm   Left -   coronary: 37.2 mm   Coronary Artery Height above Annulus:   Left Main: 16.5 mm above  annulus   Right Coronary: 18.3 mm above annulus   Virtual Basal Annulus Measurements:   Maximum / Minimum Diameter: 31.5 mm x 24.9 mm Average diameter 28.8 mm   Perimeter: 93 mm   Area: 650 mm2   Coronary Arteries: Sufficient height above annulus for deployment   Optimum Fluoroscopic Angle for Delivery: LAO 16 Caudal 13 degrees   IMPRESSION: 1.  Calcified tri leaflet AV with score 3484   2.  Annular area of 650 mm2 suitable for a 29 mm Sapien 3 valve   3. Optimum angiographic angle for deployment LAO 16 Caudal 13 degrees 4.  Coronary arteries sufficient height above annulus for deployment   5.  Membranous septal length 6.5 mm   6. Mixing artifact in LAA cannot r/o thrombus in non constrast images Patient in rapid fib/flutter during exam   Jenkins Rouge   Electronically Signed: By: Jenkins Rouge M.D. On: 02/08/2022 12:12       Impression:  This 62 year old gentleman has stage D, severe, symptomatic aortic stenosis.  He has had a complicated course with right knee septic arthritis which is currently still being treated with antibiotics per infectious disease.  Given his multiple comorbidities and difficult clinical course over the past year I do not think he is a good candidate for open surgical treatment of his aortic stenosis.  I think transcatheter aortic valve replacement would be a reasonable alternative for treating his severe aortic stenosis.  His gated cardiac CTA shows anatomy suitable for TAVR using a 29 mm SAPIEN 3 valve.  His abdominal and pelvic CTA shows adequate pelvic vascular anatomy to allow transfemoral insertion.  He will need to complete his antibiotic course for septic knee arthritis and be cleared by infectious disease before proceeding with TAVR.  Plan:  He will complete his antibiotic course and have follow-up with infectious disease with clearance before proceeding with TAVR.  I spent 20 minutes performing this established patient evaluation and  > 50% of this time was spent face to face counseling and coordinating the care of this patient's severe aortic stenosis.  Gaye Pollack, MD [facility] [phone]

## 2022-04-07 NOTE — Progress Notes (Signed)
Pre Surgical Assessment: 5 M Walk Test  76M=16.33ft  5 Meter Walk Test- trial 1: 0 seconds 5 Meter Walk Test- trial 2: 0 seconds 5 Meter Walk Test- trial 3: 0 seconds 5 Meter Walk Test Average: 0 seconds   Patient unable to complete at this time due to knee pain.

## 2022-04-08 NOTE — Telephone Encounter (Signed)
Late entry- attempt to call patient 04/07/2021 at 1557.  No answer, left message to return call    Attempt to call patient back again today. Left message on voicemail to return call.

## 2022-04-16 ENCOUNTER — Emergency Department (HOSPITAL_COMMUNITY)
Admission: EM | Admit: 2022-04-16 | Discharge: 2022-04-17 | Discharge status: Against Medical Advice | Payer: 59 | Attending: Emergency Medicine | Admitting: Emergency Medicine

## 2022-04-16 ENCOUNTER — Other Ambulatory Visit: Payer: Self-pay

## 2022-04-16 DIAGNOSIS — R197 Diarrhea, unspecified: Secondary | ICD-10-CM | POA: Insufficient documentation

## 2022-04-16 DIAGNOSIS — R112 Nausea with vomiting, unspecified: Secondary | ICD-10-CM | POA: Diagnosis not present

## 2022-04-16 DIAGNOSIS — R55 Syncope and collapse: Secondary | ICD-10-CM | POA: Diagnosis not present

## 2022-04-16 DIAGNOSIS — Z5321 Procedure and treatment not carried out due to patient leaving prior to being seen by health care provider: Secondary | ICD-10-CM | POA: Insufficient documentation

## 2022-04-16 NOTE — ED Provider Triage Note (Signed)
Emergency Medicine Provider Triage Evaluation Note  [name] , a 61 y.o. male  was evaluated in triage.  Pt complains of N/V/D since Tuesday.  Denies abdominal pain.  No fever.  No sick contacts.  Review of Systems  Positive: N/V/D Negative: fever  Physical Exam  BP (!) 173/130 (BP Location: Right Arm)   Pulse 68   Temp 97.6 F (36.4 C)   Resp 16   Ht 5\' 9"  (1.753 m)   Wt 83.9 kg   SpO2 97%   BMI 27.32 kg/m  Gen:   Awake, no distress   Resp:  Normal effort  MSK:   Moves extremities without difficulty  Other:  Normal bowel sounds, abdomen soft, non-tender  Medical Decision Making  Medically screening exam initiated at 11:59 PM.  Appropriate orders placed.  [name] was informed that the remainder of the evaluation will be completed by another provider, this initial triage assessment does not replace that evaluation, and the importance of remaining in the ED until their evaluation is complete.  N/V/D x several days.  Abdomen soft, non-tender on exam.  Labs ordered, zofran given.   Larene Pickett, PA-C 04/17/22 0001

## 2022-04-16 NOTE — ED Triage Notes (Signed)
Pt arrives with c/o n/v/d that started on Tuesday. Pt denies ABD pain.

## 2022-04-17 DIAGNOSIS — R112 Nausea with vomiting, unspecified: Secondary | ICD-10-CM | POA: Diagnosis not present

## 2022-04-17 LAB — CBC WITH DIFFERENTIAL/PLATELET
Abs Immature Granulocytes: 0.01 10*3/uL (ref 0.00–0.07)
Basophils Absolute: 0 10*3/uL (ref 0.0–0.1)
Basophils Relative: 1 %
Eosinophils Absolute: 0 10*3/uL (ref 0.0–0.5)
Eosinophils Relative: 0 %
HCT: 34.5 % — ABNORMAL LOW (ref 39.0–52.0)
Hemoglobin: 11 g/dL — ABNORMAL LOW (ref 13.0–17.0)
Immature Granulocytes: 0 %
Lymphocytes Relative: 44 %
Lymphs Abs: 1.4 10*3/uL (ref 0.7–4.0)
MCH: 22.3 pg — ABNORMAL LOW (ref 26.0–34.0)
MCHC: 31.9 g/dL (ref 30.0–36.0)
MCV: 70 fL — ABNORMAL LOW (ref 80.0–100.0)
Monocytes Absolute: 0.4 10*3/uL (ref 0.1–1.0)
Monocytes Relative: 13 %
Neutro Abs: 1.3 10*3/uL — ABNORMAL LOW (ref 1.7–7.7)
Neutrophils Relative %: 42 %
Platelets: 179 10*3/uL (ref 150–400)
RBC: 4.93 MIL/uL (ref 4.22–5.81)
RDW: 17.2 % — ABNORMAL HIGH (ref 11.5–15.5)
WBC: 3.1 10*3/uL — ABNORMAL LOW (ref 4.0–10.5)
nRBC: 1.3 % — ABNORMAL HIGH (ref 0.0–0.2)

## 2022-04-17 LAB — COMPREHENSIVE METABOLIC PANEL
ALT: 11 U/L (ref 0–44)
AST: 17 U/L (ref 15–41)
Albumin: 2.9 g/dL — ABNORMAL LOW (ref 3.5–5.0)
Alkaline Phosphatase: 81 U/L (ref 38–126)
Anion gap: 12 (ref 5–15)
BUN: 25 mg/dL — ABNORMAL HIGH (ref 8–23)
CO2: 19 mmol/L — ABNORMAL LOW (ref 22–32)
Calcium: 9.4 mg/dL (ref 8.9–10.3)
Chloride: 105 mmol/L (ref 98–111)
Creatinine, Ser: 1.85 mg/dL — ABNORMAL HIGH (ref 0.61–1.24)
GFR, Estimated: 41 mL/min — ABNORMAL LOW (ref >60)
Glucose, Bld: 116 mg/dL — ABNORMAL HIGH (ref 70–99)
Potassium: 4.1 mmol/L (ref 3.5–5.1)
Sodium: 136 mmol/L (ref 135–145)
Total Bilirubin: 0.7 mg/dL (ref 0.3–1.2)
Total Protein: 8 g/dL (ref 6.5–8.1)

## 2022-04-17 LAB — LIPASE, BLOOD: Lipase: 32 U/L (ref 11–51)

## 2022-04-17 MED ORDER — ONDANSETRON 4 MG PO TBDP
4.0000 mg | ORAL_TABLET | Freq: Once | ORAL | Status: AC
  Administered 2022-04-17: 4 mg via ORAL
  Filled 2022-04-17: qty 1

## 2022-05-02 ENCOUNTER — Inpatient Hospital Stay: Payer: 59 | Admitting: Internal Medicine

## 2022-05-06 ENCOUNTER — Telehealth: Payer: Self-pay

## 2022-05-06 NOTE — Telephone Encounter (Signed)
I left a message on the pt's identified voicemail that he needs to contact ID to arrange follow-up, the phone number for Dr Snider's office was provided in my message     

## 2022-05-25 ENCOUNTER — Telehealth: Payer: Self-pay

## 2022-05-25 NOTE — Telephone Encounter (Signed)
I left a message on the pt's identified voicemail that he needs to contact ID to arrange follow-up, the phone number for Dr Storm Frisk office was provided in my message

## 2022-05-30 ENCOUNTER — Ambulatory Visit: Payer: Self-pay

## 2022-05-30 NOTE — Telephone Encounter (Addendum)
Patient scheduled appointment for tomorrow at [facility].

## 2022-05-30 NOTE — Telephone Encounter (Signed)
The patient called in stating he has had a bad cough and his left leg is swollen. His provider does not have an appt anytime soon. Please assist patient further.     Chief Complaint: Leg swelling , "maybe gout." Asking to be worked in. Symptoms: Increased SOB Frequency: Last night Pertinent Negatives: Patient denies chest pain Disposition: '[]'$ ED /'[]'$ Urgent Care (no appt availability in office) / '[]'$ Appointment(In office/virtual)/ '[]'$  [facility]/ '[]'$ Home Care/ '[]'$ Refused Recommended Disposition /'[]'$ [facility]/ '[x]'$  Follow-up with PCP Additional Notes: Please advise pt.   Answer Assessment - Initial Assessment Questions 1. ONSET: "When did the swelling start?" (e.g., minutes, hours, days)     Last nigfht 2. LOCATION: "What part of the leg is swollen?"  "Are both legs swollen or just one leg?"     Left leg 3. SEVERITY: "How bad is the swelling?" (e.g., localized; mild, moderate, severe)   - Localized: Small area of swelling localized to one leg.   - MILD pedal edema: Swelling limited to foot and ankle, pitting edema < 1/4 inch (6 mm) deep, rest and elevation eliminate most or all swelling.   - MODERATE edema: Swelling of lower leg to knee, pitting edema > 1/4 inch (6 mm) deep, rest and elevation only partially reduce swelling.   - SEVERE edema: Swelling extends above knee, facial or hand swelling present.      Moderate 4. REDNESS: "Does the swelling look red or infected?"     No 5. PAIN: "Is the swelling painful to touch?" If Yes, ask: "How painful is it?"   (Scale 1-10; mild, moderate or severe)     No 6. FEVER: "Do you have a fever?" If Yes, ask: "What is it, how was it measured, and when did it start?"      No 7. CAUSE: "What do you think is causing the leg swelling?"     Unsure 8. MEDICAL HISTORY: "Do you have a history of blood clots (e.g., DVT), cancer, heart failure, kidney disease, or liver failure?"     Yes 9. RECURRENT SYMPTOM: "Have you had leg swelling  before?" If Yes, ask: "When was the last time?" "What happened that time?"     Yes 10. OTHER SYMPTOMS: "Do you have any other symptoms?" (e.g., chest pain, difficulty breathing)       SOB 11. PREGNANCY: "Is there any chance you are pregnant?" "When was your last menstrual period?"       N/A  Protocols used: Leg Swelling and Edema-A-AH

## 2022-05-31 ENCOUNTER — Ambulatory Visit: Payer: 59 | Admitting: Physician Assistant

## 2022-05-31 ENCOUNTER — Ambulatory Visit (INDEPENDENT_AMBULATORY_CARE_PROVIDER_SITE_OTHER): Payer: 59 | Admitting: Primary Care

## 2022-05-31 ENCOUNTER — Encounter: Payer: Self-pay | Admitting: Physician Assistant

## 2022-05-31 ENCOUNTER — Other Ambulatory Visit: Payer: Self-pay | Admitting: Physician Assistant

## 2022-05-31 VITALS — BP 176/134 | HR 56 | Ht 69.0 in | Wt 183.0 lb

## 2022-05-31 DIAGNOSIS — N1831 Chronic kidney disease, stage 3a: Secondary | ICD-10-CM

## 2022-05-31 DIAGNOSIS — I5033 Acute on chronic diastolic (congestive) heart failure: Secondary | ICD-10-CM

## 2022-05-31 DIAGNOSIS — M109 Gout, unspecified: Secondary | ICD-10-CM

## 2022-05-31 DIAGNOSIS — Z8739 Personal history of other diseases of the musculoskeletal system and connective tissue: Secondary | ICD-10-CM

## 2022-05-31 DIAGNOSIS — E7841 Elevated Lipoprotein(a): Secondary | ICD-10-CM

## 2022-05-31 DIAGNOSIS — I4819 Other persistent atrial fibrillation: Secondary | ICD-10-CM

## 2022-05-31 DIAGNOSIS — I350 Nonrheumatic aortic (valve) stenosis: Secondary | ICD-10-CM | POA: Diagnosis not present

## 2022-05-31 DIAGNOSIS — I739 Peripheral vascular disease, unspecified: Secondary | ICD-10-CM

## 2022-05-31 MED ORDER — POTASSIUM CHLORIDE CRYS ER 20 MEQ PO TBCR: 20.0000 meq | EXTENDED_RELEASE_TABLET | Freq: Every day | ORAL | 1 refills | Status: DC | PRN

## 2022-05-31 MED ORDER — ALLOPURINOL 100 MG PO TABS: 100.0000 mg | ORAL_TABLET | Freq: Every day | ORAL | 1 refills | Status: DC

## 2022-05-31 MED ORDER — APIXABAN 5 MG PO TABS: 5.0000 mg | ORAL_TABLET | Freq: Two times a day (BID) | ORAL | 3 refills | Status: DC

## 2022-05-31 MED ORDER — CARVEDILOL 25 MG PO TABS: 25.0000 mg | ORAL_TABLET | Freq: Two times a day (BID) | ORAL | 0 refills | Status: DC

## 2022-05-31 MED ORDER — FUROSEMIDE 40 MG PO TABS: 40.0000 mg | ORAL_TABLET | Freq: Every day | ORAL | 0 refills | Status: DC

## 2022-05-31 MED ORDER — ATORVASTATIN CALCIUM 20 MG PO TABS: 20.0000 mg | ORAL_TABLET | Freq: Every day | ORAL | 1 refills | Status: DC

## 2022-05-31 MED ORDER — DILTIAZEM HCL ER COATED BEADS 240 MG PO CP24: 240.0000 mg | ORAL_CAPSULE | Freq: Every day | ORAL | 0 refills | Status: DC

## 2022-05-31 NOTE — Patient Instructions (Addendum)
I sent a refill of your medications to your pharmacy.  It is very important for you to take the blood thinner on a daily basis along with your heart medications.  If you are ever worried about running out of your medications, it is fine to return to the mobile unit for assistance.  It is important for you to restart your Lasix as well to help get the fluid off of your legs.  It is very important for you to follow-up with cardiology next week on June 08, 2022.  We will call you with today's lab results, I hope that you feel better soon  Kennieth Rad, PA-C Physician Assistant [place] [email or web address]   Atrial Fibrillation Atrial fibrillation (AFib) is a type of irregular or rapid heartbeat (arrhythmia). In AFib, the top part of the heart (atria) beats in an irregular pattern. This makes the heart unable to pump blood normally and effectively. The goal of treatment is to prevent blood clots from forming, control your heart rate, or restore your heartbeat to a normal rhythm. If this condition is not treated, it can cause serious problems, such as a weakened heart muscle (cardiomyopathy) or a stroke. What are the causes? This condition is often caused by medical conditions that damage the heart's electrical system. These include: High blood pressure (hypertension). This is the most common cause. Certain heart problems or conditions, such as heart failure, coronary artery disease, heart valve problems, or heart surgery. Diabetes. Overactive thyroid (hyperthyroidism). Chronic kidney disease. Certain lung conditions, such as emphysema, pneumonia, or COPD. Obstructive sleep apnea. In some cases, the cause of this condition is not known. What increases the risk? This condition is more likely to develop in: Older adults. Athletes who do endurance exercise. People who have a family history of AFib. Males. People who are  Caucasian. People who are obese. People who smoke or misuse alcohol. What are the signs or symptoms? Symptoms of this condition include: Fast or irregular heartbeats (palpitations). Discomfort or pain in your chest. Shortness of breath. Sudden light-headedness or weakness. Tiring easily during exercise or activity. Syncope (fainting). Sweating. In some cases, there are no symptoms. How is this diagnosed? Your health care provider may detect AFib when taking your pulse. If detected, this condition may be diagnosed with: An electrocardiogram (ECG) to check electrical signals of the heart. An ambulatory cardiac monitor to record your heart's activity for a few days. A transthoracic echocardiogram (TTE) to create pictures of your heart. A transesophageal echocardiogram (TEE) to create even clearer pictures of your heart. A stress test to check your blood supply while you exercise. Imaging tests, such as a CT scan or chest X-Yeargan. Blood tests. How is this treated? Treatment depends on underlying conditions and how you feel when you get AFib. This condition may be treated with: Medicines to prevent blood clots or to treat heart rate or heart rhythm problems. Electrical cardioversion to reset the heart's rhythm. A pacemaker to correct abnormal heart rhythm. Ablation to remove the heart tissue that sends abnormal signals. Left atrial appendage closure to seal the area where blood clots can form. In some cases, underlying conditions will be treated. Follow these instructions at home: Medicines Take over-the counter and prescription medicines only as told by your provider. Do not take any new medicines without talking to your provider. If you are taking blood thinners: Talk with your provider before taking aspirin or NSAIDs. These medicines can raise your risk of bleeding. Take your medicines as  told. Take them at the same time each day. Do not do things that could hurt or bruise you. Be  careful to avoid falls. Wear an alert bracelet or carry a card that says that you take blood thinners. Lifestyle Do not use any products that contain nicotine or tobacco. These products include cigarettes, chewing tobacco, and vaping devices, such as e-cigarettes. If you need help quitting, ask your provider. Eat heart-healthy foods. Talk with a food expert (dietitian) to make an eating plan that is right for you. Exercise regularly as told by your provider. Do not drink alcohol. Lose weight if you are overweight. General instructions If you have obstructive sleep apnea, manage your condition as told by your provider. Do not use diet pills unless your provider approves. Diet pills can make heart problems worse. Keep all follow-up visits. Your provider will want to check your heart rate and rhythm regularly. Contact a health care provider if: You notice a change in the rate, rhythm, or strength of your heartbeat. You are taking a blood thinner and you notice more bruising. You tire more easily when you exercise or do heavy work. You have a sudden change in weight. Get help right away if:  You have chest pain. You have trouble breathing. You have side effects of blood thinners, such as blood in your vomit, poop (stool), or pee (urine), or bleeding that does not stop. You have any symptoms of a stroke. "BE FAST" is an easy way to remember the main warning signs of a stroke: B - Balance. Signs are dizziness, sudden trouble walking, or loss of balance. E - Eyes. Signs are trouble seeing or a sudden change in vision. F - Face. Signs are sudden weakness or numbness of the face, or the face or eyelid drooping on one side. A - Arms. Signs are weakness or numbness in an arm. This happens suddenly and usually on one side of the body. S - Speech.Signs are sudden trouble speaking, slurred speech, or trouble understanding what people say. T - Time. Time to call emergency services. Write down what time  symptoms started. Other signs of a stroke, such as: A sudden, severe headache with no known cause. Nausea or vomiting. Seizure. These symptoms may be an emergency. Get help right away. Call 911. Do not wait to see if the symptoms will go away. Do not drive yourself to the hospital. This information is not intended to replace advice given to you by your health care provider. Make sure you discuss any questions you have with your health care provider. Document Revised: 12/08/2021 Document Reviewed: 12/08/2021 Elsevier Patient Education  [address].
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
## 2022-06-01 ENCOUNTER — Emergency Department (HOSPITAL_COMMUNITY): Payer: 59

## 2022-06-01 ENCOUNTER — Inpatient Hospital Stay (HOSPITAL_COMMUNITY)
Admission: EM | Admit: 2022-06-01 | Discharge: 2022-06-13 | DRG: 853 | Disposition: A | Discharge status: Home Health | Payer: 59 | Attending: Internal Medicine | Admitting: Internal Medicine

## 2022-06-01 DIAGNOSIS — N3080 Other cystitis without hematuria: Secondary | ICD-10-CM

## 2022-06-01 DIAGNOSIS — I080 Rheumatic disorders of both mitral and aortic valves: Secondary | ICD-10-CM | POA: Diagnosis present

## 2022-06-01 DIAGNOSIS — L309 Dermatitis, unspecified: Secondary | ICD-10-CM | POA: Diagnosis present

## 2022-06-01 DIAGNOSIS — Z8673 Personal history of transient ischemic attack (TIA), and cerebral infarction without residual deficits: Secondary | ICD-10-CM

## 2022-06-01 DIAGNOSIS — I10 Essential (primary) hypertension: Secondary | ICD-10-CM | POA: Diagnosis present

## 2022-06-01 DIAGNOSIS — Z006 Encounter for examination for normal comparison and control in clinical research program: Secondary | ICD-10-CM | POA: Diagnosis not present

## 2022-06-01 DIAGNOSIS — N170 Acute kidney failure with tubular necrosis: Secondary | ICD-10-CM | POA: Diagnosis not present

## 2022-06-01 DIAGNOSIS — R911 Solitary pulmonary nodule: Secondary | ICD-10-CM | POA: Diagnosis present

## 2022-06-01 DIAGNOSIS — Z841 Family history of disorders of kidney and ureter: Secondary | ICD-10-CM

## 2022-06-01 DIAGNOSIS — M159 Polyosteoarthritis, unspecified: Secondary | ICD-10-CM | POA: Diagnosis present

## 2022-06-01 DIAGNOSIS — I5023 Acute on chronic systolic (congestive) heart failure: Secondary | ICD-10-CM | POA: Diagnosis present

## 2022-06-01 DIAGNOSIS — I25119 Atherosclerotic heart disease of native coronary artery with unspecified angina pectoris: Secondary | ICD-10-CM | POA: Diagnosis not present

## 2022-06-01 DIAGNOSIS — I5031 Acute diastolic (congestive) heart failure: Secondary | ICD-10-CM | POA: Diagnosis not present

## 2022-06-01 DIAGNOSIS — I739 Peripheral vascular disease, unspecified: Secondary | ICD-10-CM | POA: Diagnosis not present

## 2022-06-01 DIAGNOSIS — I4821 Permanent atrial fibrillation: Secondary | ICD-10-CM | POA: Diagnosis present

## 2022-06-01 DIAGNOSIS — A4150 Gram-negative sepsis, unspecified: Secondary | ICD-10-CM | POA: Diagnosis not present

## 2022-06-01 DIAGNOSIS — Z79899 Other long term (current) drug therapy: Secondary | ICD-10-CM

## 2022-06-01 DIAGNOSIS — I2510 Atherosclerotic heart disease of native coronary artery without angina pectoris: Secondary | ICD-10-CM | POA: Diagnosis not present

## 2022-06-01 DIAGNOSIS — R079 Chest pain, unspecified: Secondary | ICD-10-CM

## 2022-06-01 DIAGNOSIS — E876 Hypokalemia: Secondary | ICD-10-CM | POA: Diagnosis not present

## 2022-06-01 DIAGNOSIS — N1832 Chronic kidney disease, stage 3b: Secondary | ICD-10-CM | POA: Diagnosis not present

## 2022-06-01 DIAGNOSIS — Z91040 Latex allergy status: Secondary | ICD-10-CM

## 2022-06-01 DIAGNOSIS — Z952 Presence of prosthetic heart valve: Secondary | ICD-10-CM | POA: Diagnosis not present

## 2022-06-01 DIAGNOSIS — M549 Dorsalgia, unspecified: Secondary | ICD-10-CM | POA: Diagnosis present

## 2022-06-01 DIAGNOSIS — J189 Pneumonia, unspecified organism: Secondary | ICD-10-CM

## 2022-06-01 DIAGNOSIS — N3081 Other cystitis with hematuria: Secondary | ICD-10-CM | POA: Diagnosis present

## 2022-06-01 DIAGNOSIS — N189 Chronic kidney disease, unspecified: Secondary | ICD-10-CM | POA: Diagnosis not present

## 2022-06-01 DIAGNOSIS — I878 Other specified disorders of veins: Secondary | ICD-10-CM | POA: Diagnosis present

## 2022-06-01 DIAGNOSIS — A419 Sepsis, unspecified organism: Secondary | ICD-10-CM | POA: Insufficient documentation

## 2022-06-01 DIAGNOSIS — R918 Other nonspecific abnormal finding of lung field: Secondary | ICD-10-CM | POA: Diagnosis not present

## 2022-06-01 DIAGNOSIS — Z1611 Resistance to penicillins: Secondary | ICD-10-CM | POA: Diagnosis not present

## 2022-06-01 DIAGNOSIS — I700 Atherosclerosis of aorta: Secondary | ICD-10-CM | POA: Diagnosis present

## 2022-06-01 DIAGNOSIS — R188 Other ascites: Secondary | ICD-10-CM | POA: Diagnosis present

## 2022-06-01 DIAGNOSIS — D509 Iron deficiency anemia, unspecified: Secondary | ICD-10-CM | POA: Diagnosis not present

## 2022-06-01 DIAGNOSIS — Z8249 Family history of ischemic heart disease and other diseases of the circulatory system: Secondary | ICD-10-CM

## 2022-06-01 DIAGNOSIS — I2729 Other secondary pulmonary hypertension: Secondary | ICD-10-CM | POA: Diagnosis present

## 2022-06-01 DIAGNOSIS — D649 Anemia, unspecified: Secondary | ICD-10-CM

## 2022-06-01 DIAGNOSIS — I472 Ventricular tachycardia, unspecified: Secondary | ICD-10-CM | POA: Diagnosis not present

## 2022-06-01 DIAGNOSIS — Z825 Family history of asthma and other chronic lower respiratory diseases: Secondary | ICD-10-CM

## 2022-06-01 DIAGNOSIS — I5033 Acute on chronic diastolic (congestive) heart failure: Secondary | ICD-10-CM | POA: Diagnosis not present

## 2022-06-01 DIAGNOSIS — J439 Emphysema, unspecified: Secondary | ICD-10-CM | POA: Diagnosis not present

## 2022-06-01 DIAGNOSIS — G9341 Metabolic encephalopathy: Secondary | ICD-10-CM | POA: Diagnosis not present

## 2022-06-01 DIAGNOSIS — E872 Acidosis, unspecified: Secondary | ICD-10-CM | POA: Diagnosis not present

## 2022-06-01 DIAGNOSIS — I130 Hypertensive heart and chronic kidney disease with heart failure and stage 1 through stage 4 chronic kidney disease, or unspecified chronic kidney disease: Secondary | ICD-10-CM | POA: Diagnosis present

## 2022-06-01 DIAGNOSIS — R570 Cardiogenic shock: Secondary | ICD-10-CM | POA: Diagnosis not present

## 2022-06-01 DIAGNOSIS — D631 Anemia in chronic kidney disease: Secondary | ICD-10-CM | POA: Diagnosis not present

## 2022-06-01 DIAGNOSIS — I5030 Unspecified diastolic (congestive) heart failure: Secondary | ICD-10-CM | POA: Diagnosis not present

## 2022-06-01 DIAGNOSIS — J90 Pleural effusion, not elsewhere classified: Secondary | ICD-10-CM | POA: Diagnosis not present

## 2022-06-01 DIAGNOSIS — I5043 Acute on chronic combined systolic (congestive) and diastolic (congestive) heart failure: Secondary | ICD-10-CM | POA: Diagnosis not present

## 2022-06-01 DIAGNOSIS — I350 Nonrheumatic aortic (valve) stenosis: Secondary | ICD-10-CM | POA: Diagnosis not present

## 2022-06-01 DIAGNOSIS — N179 Acute kidney failure, unspecified: Secondary | ICD-10-CM | POA: Insufficient documentation

## 2022-06-01 DIAGNOSIS — E871 Hypo-osmolality and hyponatremia: Secondary | ICD-10-CM | POA: Diagnosis not present

## 2022-06-01 DIAGNOSIS — G4733 Obstructive sleep apnea (adult) (pediatric): Secondary | ICD-10-CM | POA: Diagnosis present

## 2022-06-01 DIAGNOSIS — I872 Venous insufficiency (chronic) (peripheral): Secondary | ICD-10-CM | POA: Diagnosis present

## 2022-06-01 DIAGNOSIS — R6520 Severe sepsis without septic shock: Secondary | ICD-10-CM | POA: Diagnosis not present

## 2022-06-01 DIAGNOSIS — R0602 Shortness of breath: Principal | ICD-10-CM

## 2022-06-01 DIAGNOSIS — R0789 Other chest pain: Secondary | ICD-10-CM | POA: Diagnosis not present

## 2022-06-01 DIAGNOSIS — Z1152 Encounter for screening for COVID-19: Secondary | ICD-10-CM

## 2022-06-01 DIAGNOSIS — G8929 Other chronic pain: Secondary | ICD-10-CM | POA: Diagnosis present

## 2022-06-01 DIAGNOSIS — E7800 Pure hypercholesterolemia, unspecified: Secondary | ICD-10-CM | POA: Diagnosis present

## 2022-06-01 DIAGNOSIS — N390 Urinary tract infection, site not specified: Secondary | ICD-10-CM | POA: Diagnosis not present

## 2022-06-01 DIAGNOSIS — D696 Thrombocytopenia, unspecified: Secondary | ICD-10-CM

## 2022-06-01 DIAGNOSIS — I509 Heart failure, unspecified: Secondary | ICD-10-CM | POA: Diagnosis not present

## 2022-06-01 DIAGNOSIS — R1013 Epigastric pain: Secondary | ICD-10-CM | POA: Diagnosis not present

## 2022-06-01 DIAGNOSIS — I2489 Other forms of acute ischemic heart disease: Secondary | ICD-10-CM | POA: Diagnosis not present

## 2022-06-01 DIAGNOSIS — K219 Gastro-esophageal reflux disease without esophagitis: Secondary | ICD-10-CM | POA: Diagnosis present

## 2022-06-01 DIAGNOSIS — Z888 Allergy status to other drugs, medicaments and biological substances status: Secondary | ICD-10-CM

## 2022-06-01 DIAGNOSIS — N3289 Other specified disorders of bladder: Secondary | ICD-10-CM | POA: Diagnosis not present

## 2022-06-01 DIAGNOSIS — E8809 Other disorders of plasma-protein metabolism, not elsewhere classified: Secondary | ICD-10-CM | POA: Diagnosis present

## 2022-06-01 DIAGNOSIS — I1A0 Resistant hypertension: Secondary | ICD-10-CM | POA: Diagnosis present

## 2022-06-01 DIAGNOSIS — I2583 Coronary atherosclerosis due to lipid rich plaque: Secondary | ICD-10-CM | POA: Diagnosis not present

## 2022-06-01 DIAGNOSIS — I6381 Other cerebral infarction due to occlusion or stenosis of small artery: Secondary | ICD-10-CM | POA: Diagnosis not present

## 2022-06-01 DIAGNOSIS — N281 Cyst of kidney, acquired: Secondary | ICD-10-CM | POA: Diagnosis not present

## 2022-06-01 DIAGNOSIS — I4891 Unspecified atrial fibrillation: Secondary | ICD-10-CM | POA: Diagnosis present

## 2022-06-01 DIAGNOSIS — E875 Hyperkalemia: Secondary | ICD-10-CM | POA: Diagnosis not present

## 2022-06-01 DIAGNOSIS — Z91148 Patient's other noncompliance with medication regimen for other reason: Secondary | ICD-10-CM

## 2022-06-01 DIAGNOSIS — I493 Ventricular premature depolarization: Secondary | ICD-10-CM | POA: Diagnosis not present

## 2022-06-01 DIAGNOSIS — I480 Paroxysmal atrial fibrillation: Secondary | ICD-10-CM | POA: Diagnosis not present

## 2022-06-01 LAB — TSH: TSH: 3.735 u[IU]/mL (ref 0.350–4.500)

## 2022-06-01 LAB — CBC
HCT: 35.9 % — ABNORMAL LOW (ref 39.0–52.0)
Hemoglobin: 11.4 g/dL — ABNORMAL LOW (ref 13.0–17.0)
MCH: 24.3 pg — ABNORMAL LOW (ref 26.0–34.0)
MCHC: 31.8 g/dL (ref 30.0–36.0)
MCV: 76.5 fL — ABNORMAL LOW (ref 80.0–100.0)
Platelets: 114 10*3/uL — ABNORMAL LOW (ref 150–400)
RBC: 4.69 MIL/uL (ref 4.22–5.81)
RDW: 24 % — ABNORMAL HIGH (ref 11.5–15.5)
WBC: 3.9 10*3/uL — ABNORMAL LOW (ref 4.0–10.5)
nRBC: 7.1 % — ABNORMAL HIGH (ref 0.0–0.2)

## 2022-06-01 LAB — I-STAT VENOUS BLOOD GAS, ED
Acid-base deficit: 5 mmol/L — ABNORMAL HIGH (ref 0.0–2.0)
Bicarbonate: 19.3 mmol/L — ABNORMAL LOW (ref 20.0–28.0)
Calcium, Ion: 1.15 mmol/L (ref 1.15–1.40)
HCT: 42 % (ref 39.0–52.0)
Hemoglobin: 14.3 g/dL (ref 13.0–17.0)
O2 Saturation: 67 %
Potassium: 6.2 mmol/L — ABNORMAL HIGH (ref 3.5–5.1)
Sodium: 134 mmol/L — ABNORMAL LOW (ref 135–145)
TCO2: 20 mmol/L — ABNORMAL LOW (ref 22–32)
pCO2, Ven: 34.4 mmHg — ABNORMAL LOW (ref 44–60)
pH, Ven: 7.357 (ref 7.25–7.43)
pO2, Ven: 36 mmHg (ref 32–45)

## 2022-06-01 LAB — COMPREHENSIVE METABOLIC PANEL
ALT: 28 U/L (ref 0–44)
AST: 22 U/L (ref 15–41)
Albumin: 2.4 g/dL — ABNORMAL LOW (ref 3.5–5.0)
Alkaline Phosphatase: 152 U/L — ABNORMAL HIGH (ref 38–126)
Anion gap: 10 (ref 5–15)
BUN: 48 mg/dL — ABNORMAL HIGH (ref 8–23)
CO2: 17 mmol/L — ABNORMAL LOW (ref 22–32)
Calcium: 9 mg/dL (ref 8.9–10.3)
Chloride: 107 mmol/L (ref 98–111)
Creatinine, Ser: 2.67 mg/dL — ABNORMAL HIGH (ref 0.61–1.24)
GFR, Estimated: 26 mL/min — ABNORMAL LOW (ref >60)
Glucose, Bld: 120 mg/dL — ABNORMAL HIGH (ref 70–99)
Potassium: 5 mmol/L (ref 3.5–5.1)
Sodium: 134 mmol/L — ABNORMAL LOW (ref 135–145)
Total Bilirubin: 1.6 mg/dL — ABNORMAL HIGH (ref 0.3–1.2)
Total Protein: 5.7 g/dL — ABNORMAL LOW (ref 6.5–8.1)

## 2022-06-01 LAB — PHOSPHORUS: Phosphorus: 4.5 mg/dL (ref 2.5–4.6)

## 2022-06-01 LAB — URINALYSIS, ROUTINE W REFLEX MICROSCOPIC
Bilirubin Urine: NEGATIVE
Glucose, UA: NEGATIVE mg/dL
Ketones, ur: NEGATIVE mg/dL
Nitrite: NEGATIVE
Protein, ur: >=300 mg/dL — AB
RBC / HPF: >50 RBC/hpf (ref 0–5)
Specific Gravity, Urine: 1.015 (ref 1.005–1.030)
WBC, UA: >50 WBC/hpf (ref 0–5)
pH: 5 (ref 5.0–8.0)

## 2022-06-01 LAB — BRAIN NATRIURETIC PEPTIDE
B Natriuretic Peptide: 670.6 pg/mL — ABNORMAL HIGH (ref 0.0–100.0)
BNP: 917.1 pg/mL — ABNORMAL HIGH (ref 0.0–100.0)

## 2022-06-01 LAB — AMMONIA: Ammonia: 16 umol/L (ref 9–35)

## 2022-06-01 LAB — BASIC METABOLIC PANEL
BUN/Creatinine Ratio: 20 (ref 10–24)
BUN: 47 mg/dL — ABNORMAL HIGH (ref 8–27)
CO2: 14 mmol/L — ABNORMAL LOW (ref 20–29)
Calcium: 8.9 mg/dL (ref 8.6–10.2)
Chloride: 107 mmol/L — ABNORMAL HIGH (ref 96–106)
Creatinine, Ser: 2.32 mg/dL — ABNORMAL HIGH (ref 0.76–1.27)
Glucose: 105 mg/dL — ABNORMAL HIGH (ref 70–99)
Potassium: 4.9 mmol/L (ref 3.5–5.2)
Sodium: 138 mmol/L (ref 134–144)
eGFR: 31 mL/min/{1.73_m2} — ABNORMAL LOW (ref >59)

## 2022-06-01 LAB — LIPASE, BLOOD: Lipase: 53 U/L — ABNORMAL HIGH (ref 11–51)

## 2022-06-01 LAB — TROPONIN I (HIGH SENSITIVITY)
Troponin I (High Sensitivity): 64 ng/L — ABNORMAL HIGH (ref <18)
Troponin I (High Sensitivity): 60 ng/L — ABNORMAL HIGH (ref <18)

## 2022-06-01 LAB — URIC ACID: Uric Acid: 10.2 mg/dL — ABNORMAL HIGH (ref 3.8–8.4)

## 2022-06-01 LAB — MAGNESIUM: Magnesium: 1.6 mg/dL — ABNORMAL LOW (ref 1.7–2.4)

## 2022-06-01 MED ORDER — IPRATROPIUM-ALBUTEROL 0.5-2.5 (3) MG/3ML IN SOLN
3.0000 mL | Freq: Once | RESPIRATORY_TRACT | Status: AC
  Administered 2022-06-01: 3 mL via RESPIRATORY_TRACT
  Filled 2022-06-01: qty 3

## 2022-06-01 MED ORDER — NALOXONE HCL 0.4 MG/ML IJ SOLN
0.4000 mg | Freq: Once | INTRAMUSCULAR | Status: AC
  Administered 2022-06-01: 0.4 mg via INTRAVENOUS
  Filled 2022-06-01: qty 1

## 2022-06-01 MED ORDER — LACTATED RINGERS IV BOLUS
500.0000 mL | Freq: Once | INTRAVENOUS | Status: AC
  Administered 2022-06-01: 500 mL via INTRAVENOUS

## 2022-06-01 MED ORDER — SODIUM CHLORIDE 0.9 % IV SOLN
1.0000 g | Freq: Once | INTRAVENOUS | Status: AC
  Administered 2022-06-01: 1 g via INTRAVENOUS
  Filled 2022-06-01: qty 10

## 2022-06-01 MED ORDER — LACTATED RINGERS IV BOLUS: 500.0000 mL | Freq: Once | INTRAVENOUS | Status: DC

## 2022-06-01 NOTE — ED Provider Notes (Addendum)
I provided a substantive portion of the care of this patient.  I personally made/approved the management plan for this patient and take responsibility for the patient management.  EKG Interpretation  Date/Time:  Wednesday June 01 2022 18:19:51 EST Ventricular Rate:  66 PR Interval:    QRS Duration: 114 QT Interval:  499 QTC Calculation: 523 R Axis:   124 Text Interpretation: Atrial fibrillation Probable lateral infarct, age indeterminate Anterior infarct, old Prolonged QT interval no sig change from older tracings Confirmed by Charlesetta Shanks [phone]) on 06/01/2022 6:23:31 PM  Been off all of his medications for several months.  He reports he has been intermittently lightheaded and sometimes getting blurred vision.  He denies he is having any headaches.  No focal weakness numbness or tingling.  He reports he just in the past few hours started getting central epigastric abdominal pain.  He however then endorsed vomiting which she denied in triage and says he has been vomiting for several days.  Patient is somnolent but arouses to stimulus and is situationally alert and oriented.  Once awakened his speech is normal and there are no focal motor deficits.  Then drifts back to sleep shortly thereafter.  Lungs are grossly clear no crackle or wheeze.  Irregularly irregular.  Monitor shows atrial fibrillation.  Abdomen is soft but patient endorses diffuse epigastric pain to palpation.  Patient has about 2+ pitting edema of the lower extremities.  He has skin changes consistent with chronic venous stasis but does not appear to have acute cellulitis.  Patient is asleep his heart rate goes down and blood pressures are down.  Once I awaken him his respirations increase and his heart rate and blood pressure are normotensive to soft.  Personally reviewed his chest x-Azar.  He has cardiomegaly which appears stable compared to prior studies.  He does not have vascular congestion or consolidations.  Radiology  interpretation reviewed as well.  Patient does not have any critical anemia, venous gas pH of 7.3 and pCO2 of 34 without evidence of hypercapnia.  Ammonia 16.  BUN and creatinine are elevated from baseline with creatinine at 2.6 and GFR at 26.  Potassium 5.0.  Narcan 0.4 mg tried for somnolence.  Patient awakened some but not a dramatic change.  He did complain of more abdominal pain.  At this time we are expediting his CT head chest and abdomen.  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  Will start Rocephin IV and continue incremental fluid resuscitation for AKI.  Will add blood cultures and lactic acid.  Consult: [facility] hospitalist Dr. Marlowe Sax for admission.  CRITICAL CARE Performed by: Charlesetta Shanks   Total critical care time: 30 minutes  Critical care time was exclusive of separately billable procedures and treating other patients.  Critical care was necessary to treat or prevent imminent or life-threatening deterioration.  Critical care was time spent personally by me on the following activities: development of treatment plan with patient and/or surrogate as well as nursing, discussions with consultants, evaluation of patient's response to treatment, examination of patient, obtaining history from patient or surrogate, ordering and performing treatments and interventions, ordering and review of laboratory studies, ordering and review of radiographic studies, pulse oximetry and re-evaluation of patient's condition.      Charlesetta Shanks, MD 06/01/22 [id number]    Charlesetta Shanks, MD 06/01/22 [id number]    Charlesetta Shanks, MD 06/01/22 [phone]

## 2022-06-01 NOTE — ED Notes (Signed)
Pt in CT.
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
## 2022-06-01 NOTE — ED Triage Notes (Signed)
Pt to ED c/o abnormal labs, reports had bloodwork done yesterdayand was notified that lab values were up for CHF and to come to the ED. Reports hx CHF,CKD. Also reports chest pain that started 2 hours ago

## 2022-06-01 NOTE — ED Provider Triage Note (Signed)
Emergency Medicine Provider Triage Evaluation Note  [name] , a 62 y.o. male  was evaluated in triage.  Pt complains of concerns for abnormal lab.  Notes that the lab work for CHF was elevated and told to come to the emergency department.  Has a history of CHF, CKD.  Also notes chest pain that started 2 hours ago.  Has upper abdominal pain and nausea.  Denies vomiting.  Denies stent placement.  Review of Systems  Positive:  Negative:   Physical Exam  BP 98/82 (BP Location: Right Arm)   Pulse 70   Resp 18   Ht '5\' 9"'$  (1.753 m)   Wt 83 kg   SpO2 100%   BMI 27.02 kg/m  Gen:   Awake, no distress   Resp:  Normal effort  MSK:   Moves extremities without difficulty  Other:  No chest wall tenderness to palpation.  Tenderness to palpation noted upper abdominal region.  Medical Decision Making  Medically screening exam initiated at 3:49 PM.  Appropriate orders placed.  [name] was informed that the remainder of the evaluation will be completed by another provider, this initial triage assessment does not replace that evaluation, and the importance of remaining in the ED until their evaluation is complete.  3:50 PM - Discussed with RN that patient is in need of a room. RN aware and working on room placement.    Nevaeh Korte A, PA-C 06/01/22 1550

## 2022-06-01 NOTE — ED Provider Notes (Signed)
[facility] Provider Note   CSN: [id number] Arrival date & time: 06/01/22  1535     History  Chief Complaint  Patient presents with   Chest Pain   abnormal labs    [name] is a 62 y.o. male.   Chest Pain   62 year old male presents emergency department with complaints of shortness of breath, lower extremity swelling, abdominal pain.  Patient states his symptoms of shortness of breath has been present for "a very long time."  Patient states that he has been without his at home medications since sometime in September or November of this past year which he saw primary care yesterday where his medicines were refilled.  Reports some centralized chest pain without radiation beginning approximately 2 hours prior to arrival.  Describes pain as pressure.  Also reports epigastric abdominal pain.  Denies fever, nausea, vomiting, urinary symptoms, change in bowel habits.  Past medical history significant for CHF, bilateral lower extremity edema, severe aortic stenosis, OSA, GERD, hypercholesterolemia, hypertension, CVA, PAD  Home Medications Prior to Admission medications   Medication Sig Start Date End Date Taking? Authorizing Provider  acetaminophen (TYLENOL) 500 MG tablet Take 500 mg by mouth 3 (three) times daily as needed for moderate pain.    [provider]  allopurinol (ZYLOPRIM) 100 MG tablet Take 1 tablet (100 mg total) by mouth daily. 05/31/22 07/30/22  Mayers, Cari S, PA-C  apixaban (ELIQUIS) 5 MG TABS tablet Take 1 tablet (5 mg total) by mouth 2 (two) times daily. 05/31/22   Mayers, Loraine Grip, PA-C  aspirin EC 81 MG tablet Take 81 mg by mouth daily. Swallow whole.    [provider]  atorvastatin (LIPITOR) 20 MG tablet Take 1 tablet (20 mg total) by mouth daily. 05/31/22   Mayers, Cari S, PA-C  carvedilol (COREG) 25 MG tablet Take 1 tablet (25 mg total) by mouth 2 (two) times daily with a meal. 05/31/22   Mayers, Cari S, PA-C   diltiazem (CARDIZEM CD) 240 MG 24 hr capsule Take 1 capsule (240 mg total) by mouth daily. 05/31/22   Mayers, Cari S, PA-C  FARXIGA 10 MG TABS tablet Take 10 mg by mouth daily. 01/08/22   [provider]  ferrous sulfate 325 (65 FE) MG EC tablet Take 1 tablet (325 mg total) by mouth every other day. 01/06/22 01/06/23  Danford, Suann Larry, MD  furosemide (LASIX) 40 MG tablet Take 1 tablet (40 mg total) by mouth daily. 05/31/22   Mayers, Cari S, PA-C  gabapentin (NEURONTIN) 400 MG capsule Take 2 capsules (800 mg total) by mouth 3 (three) times daily. Patient not taking: Reported on 05/31/2022 02/28/22   [name], MD  magnesium oxide (MAG-OX) 400 MG tablet Take 1 tablet (400 mg total) by mouth every other day. 11/09/21   Donato Heinz, MD  olmesartan (BENICAR) 40 MG tablet Take 40 mg by mouth daily.    [provider]  omeprazole (PRILOSEC) 40 MG capsule Take 1 capsule (40 mg total) by mouth daily. 12/07/21   Donato Heinz, MD  potassium chloride SA (KLOR-CON M) 20 MEQ tablet TAKE 1 TABLET BY MOUTH DAILY AS NEEDED WHEN YOU TAKE LASIX 06/01/22   [name], MD      Allergies    Adhesive [tape] and Latex    Review of Systems   Review of Systems  Cardiovascular:  Positive for chest pain.  All other systems reviewed and are negative.  Physical Exam Updated Vital Signs BP 113/80   Pulse (!) 52   Temp (!) 95.9 F (35.5 C) (Axillary)   Resp 12   Ht '5\' 9"'$  (1.753 m)   Wt 83 kg   SpO2 97%   BMI 27.02 kg/m  Physical Exam Vitals and nursing note reviewed.  Constitutional:      Appearance: He is well-developed. He is ill-appearing.     Comments: Patient hypersomnolent on exam dozing off in between questions.  Patient responding to painful stimuli and able to hold conversation for few sentences before drifting off to sleep.  HENT:     Head: Normocephalic and atraumatic.  Eyes:     Conjunctiva/sclera: Conjunctivae normal.  Cardiovascular:      Rate and Rhythm: Normal rate. Rhythm irregular.  Pulmonary:     Effort: No respiratory distress.     Breath sounds: Normal breath sounds. No stridor. No wheezing, rhonchi or rales.     Comments: Patient tachypneic. Abdominal:     Palpations: Abdomen is soft.     Tenderness: There is no abdominal tenderness.     Comments: Abdomen soft with no guarding appreciated.  Patient with epigastric tenderness to palpation.  Musculoskeletal:        General: No swelling.     Cervical back: Neck supple.     Right lower leg: Edema present.     Left lower leg: Edema present.     Comments: 2-3+ bilateral lower extremity edema.  Chronic skin changes consistent with venous stasis disease.  Skin:    General: Skin is warm and dry.     Capillary Refill: Capillary refill takes less than 2 seconds.  Neurological:     Comments: Patient with mildly slurred speech and dozing off in between sentences during discourse.  He is alert to time place and person.  Patient moves all 4 extremities without difficulty.  Normal gait.  CN I not tested  CN II not tested CN III, IV, VI PERRLA and EOMs intact bilaterally  CN V Intact sensation to sharp and light touch to the face  CN VII facial movements symmetric  CN VIII not tested  CN IX, X no uvula deviation, symmetric rise of soft palate  CN XI 5/5 SCM and trapezius strength bilaterally  CN XII Midline tongue protrusion, symmetric L/R movements     Psychiatric:        Mood and Affect: Mood normal.    ED Results / Procedures / Treatments   Labs (all labs ordered are listed, but only abnormal results are displayed) Labs Reviewed  CBC - Abnormal; Notable for the following components:      Result Value   WBC 3.9 (*)    Hemoglobin 11.4 (*)    HCT 35.9 (*)    MCV 76.5 (*)    MCH 24.3 (*)    RDW 24.0 (*)    Platelets 114 (*)    nRBC 7.1 (*)    All other components within normal limits  BRAIN NATRIURETIC PEPTIDE - Abnormal; Notable for the following  components:   B Natriuretic Peptide 670.6 (*)    All other components within normal limits  COMPREHENSIVE METABOLIC PANEL - Abnormal; Notable for the following components:   Sodium 134 (*)    CO2 17 (*)    Glucose, Bld 120 (*)    BUN 48 (*)    Creatinine, Ser 2.67 (*)    Total Protein 5.7 (*)    Albumin 2.4 (*)    Alkaline Phosphatase  152 (*)    Total Bilirubin 1.6 (*)    GFR, Estimated 26 (*)    All other components within normal limits  LIPASE, BLOOD - Abnormal; Notable for the following components:   Lipase 53 (*)    All other components within normal limits  URINALYSIS, ROUTINE W REFLEX MICROSCOPIC - Abnormal; Notable for the following components:   Color, Urine AMBER (*)    APPearance CLOUDY (*)    Hgb urine dipstick LARGE (*)    Protein, ur >=300 (*)    Leukocytes,Ua LARGE (*)    Bacteria, UA MANY (*)    All other components within normal limits  I-STAT VENOUS BLOOD GAS, ED - Abnormal; Notable for the following components:   pCO2, Ven 34.4 (*)    Bicarbonate 19.3 (*)    TCO2 20 (*)    Acid-base deficit 5.0 (*)    Sodium 134 (*)    Potassium 6.2 (*)    All other components within normal limits  TROPONIN I (HIGH SENSITIVITY) - Abnormal; Notable for the following components:   Troponin I (High Sensitivity) 64 (*)    All other components within normal limits  TROPONIN I (HIGH SENSITIVITY) - Abnormal; Notable for the following components:   Troponin I (High Sensitivity) 60 (*)    All other components within normal limits  AMMONIA  RAPID URINE DRUG SCREEN, HOSP PERFORMED  MAGNESIUM  PHOSPHORUS  TSH    EKG EKG Interpretation  Date/Time:  Wednesday June 01 2022 18:19:51 EST Ventricular Rate:  66 PR Interval:    QRS Duration: 114 QT Interval:  499 QTC Calculation: 523 R Axis:   124 Text Interpretation: Atrial fibrillation Probable lateral infarct, age indeterminate Anterior infarct, old Prolonged QT interval no sig change from older tracings Confirmed by  Charlesetta Shanks [phone]) on 06/01/2022 6:23:31 PM  Radiology CT CHEST ABDOMEN PELVIS WO CONTRAST  Addendum Date: 06/01/2022   ADDENDUM REPORT: 06/01/2022 20:28 ADDENDUM: Critical findings were reported to Dr. Johnney Killian at 8:27 p.m. Electronically Signed   By: Brett Fairy M.D.   On: 06/01/2022 20:28   Result Date: 06/01/2022 CLINICAL DATA:  Dyspnea, chronic, unclear etiology. Mental status change. EXAM: CT CHEST, ABDOMEN AND PELVIS WITHOUT CONTRAST TECHNIQUE: Multidetector CT imaging of the chest, abdomen and pelvis was performed following the standard protocol without IV contrast. RADIATION DOSE REDUCTION: This exam was performed according to the departmental dose-optimization program which includes automated exposure control, adjustment of the mA and/or kV according to patient size and/or use of iterative reconstruction technique. COMPARISON:  01/14/2022. FINDINGS: CT CHEST FINDINGS Cardiovascular: The heart is enlarged and there is no pericardial effusion. Three-vessel coronary artery calcifications are noted. There is atherosclerotic calcification of the aorta without evidence of aneurysm. The pulmonary trunk is normal in caliber. Mediastinum/Nodes: Enlarged lymph nodes are present in the mediastinum measuring up to 1.1 cm in short axis diameter in the precarinal space. No axillary lymphadenopathy. Evaluation of the hila is limited due to lack of IV contrast. The thyroid gland, trachea, and esophagus are within normal limits. Lungs/Pleura: There is a small right pleural effusion and trace left pleural effusion. Paraseptal and centrilobular emphysematous changes are present in the lungs. There is hazy ground-glass attenuation in the lungs bilaterally with a central predominance. Atelectasis is present bilaterally. Cluster of nodules is noted in the right middle lobe, the largest measuring 6 mm, axial image 87. No pneumothorax. Musculoskeletal: No acute or suspicious osseous abnormality. CT ABDOMEN PELVIS  FINDINGS Hepatobiliary: No focal liver abnormality is seen.  No gallstones. There is questionable gallbladder wall thickening versus pericholecystic edema. Pancreas: Unremarkable. No pancreatic ductal dilatation or surrounding inflammatory changes. Spleen: Normal in size without focal abnormality. Adrenals/Urinary Tract: The adrenal glands are within normal limits. There is a cyst in the lower pole of the right kidney. No renal calculus or hydronephrosis. 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 Stomach/Bowel: Stomach is within normal limits. Appendix appears normal. No evidence of bowel wall thickening, distention, or inflammatory changes. No free air or pneumatosis. Few scattered diverticula are present along the colon without evidence of diverticulitis. Vascular/Lymphatic: Aortic atherosclerosis. Prominent lymph nodes are noted in the retroperitoneum measuring up to 1 cm in short axis diameter. There are enlarged lymph nodes in the inguinal regions bilaterally measuring up to 1.8 cm on the left. 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 Reproductive: Prostate is unremarkable. Other: Mild ascites in all 4 quadrants and mesenteric fat stranding. Fat containing inguinal hernias are noted bilaterally fat containing umbilical hernia is present. Anasarca is noted. Musculoskeletal: No acute osseous abnormality. IMPRESSION: 1. 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 2. Hazy ground-glass opacities in the lungs bilaterally with a central predominance, possible edema or infiltrate. 3. Cluster of nodules in the right middle lobe measuring up to 6 mm, which may be infectious or inflammatory. Non-contrast chest CT at 3-6 months is recommended. If the nodules are stable at time of repeat CT, then future CT at 18-24  months (from today's scan) is considered optional for low-risk patients, but is recommended for high-risk patients. This recommendation follows the consensus statement: Guidelines for Management of Incidental Pulmonary Nodules Detected on CT Images: From the Fleischner Society 2017; Radiology 2017; 284:228-243. 4. Small right pleural effusion and trace left pleural effusion. 5. Emphysema. 6. Mild ascites and anasarca. 7. Suggestion of gallbladder wall thickening versus pericholecystic edema which may be associated with local inflammatory changes or edema. Consider ultrasound for further evaluation. 8. 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 9. Nonspecific lymphadenopathy in the retroperitoneum and inguinal regions bilaterally, unchanged from the prior exam. 10. Aortic atherosclerosis and multi-vessel coronary artery calcifications. 11. Remaining incidental findings as described above. Electronically Signed: By: Brett Fairy M.D. On: 06/01/2022 20:24   CT Head Wo Contrast  Result Date: 06/01/2022 CLINICAL DATA:  Mental status change EXAM: CT HEAD WITHOUT CONTRAST TECHNIQUE: Contiguous axial images were obtained from the base of the skull through the vertex without intravenous contrast. RADIATION DOSE REDUCTION: This exam was performed according to the departmental dose-optimization program which includes automated exposure control, adjustment of the mA and/or kV according to patient size and/or use of iterative reconstruction technique. COMPARISON:  Head CT 03/24/2022 FINDINGS: Brain: No evidence of acute infarction, hemorrhage, hydrocephalus, extra-axial collection or mass lesion/mass effect. There is stable moderate periventricular and deep white matter hypodensity, likely chronic small vessel ischemic change. There is an old lacunar infarct in the right basal ganglia. Vascular: Atherosclerotic calcifications are present within the cavernous internal carotid arteries.  Skull: Normal. Negative for fracture or focal lesion. Sinuses/Orbits: No acute finding. Other: None. IMPRESSION: 1. No acute intracranial process. 2. Stable moderate chronic small vessel ischemic change. 3. 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 Electronically Signed   By: Ronney Asters M.D.   On: 06/01/2022 20:13   DG Chest  1 View  Result Date: 06/01/2022 CLINICAL DATA:  Chest pain EXAM: CHEST  1 VIEW COMPARISON:  X-[facility] 03/24/2022 FINDINGS: Enlarged cardiopericardial silhouette. No consolidation, pneumothorax or effusion. No edema. Calcified aorta. IMPRESSION: Enlarged cardiopericardial silhouette. Electronically Signed   By: Jill Side M.D.   On: 06/01/2022 16:25    Procedures Procedures    Medications Ordered in ED Medications  lactated ringers bolus 500 mL (has no administration in time range)  naloxone [facility]) injection 0.4 mg (0.4 mg Intravenous Given 06/01/22 1900)  ipratropium-albuterol (DUONEB) 0.5-2.5 (3) MG/3ML nebulizer solution 3 mL (3 mLs Nebulization Given 06/01/22 2030)    ED Course/ Medical Decision Making/ A&P                             Medical Decision Making Amount and/or Complexity of Data Reviewed Labs: ordered. Radiology: ordered.  Risk Prescription drug management.   This patient presents to the ED for concern of shortness of breath/abdominal pain, this involves an extensive number of treatment options, and is a complaint that carries with it a high risk of complications and morbidity.  The differential diagnosis includes The causes for shortness of breath include but are not limited to Cardiac (AHF, pericardial effusion and tamponade, arrhythmias, ischemia, etc) Respiratory (COPD, asthma, pneumonia, pneumothorax, primary pulmonary hypertension, PE/VQ mismatch) Hematological (anemia) -Pancreatitis, gastritis, CBD pathology, cholecystitis, mesenteric ischemia, aortic dissection, AAA, SBO/LBO, sepsis, cystitis, pyelonephritis, septic nephrolithiasis,  nephrolithiasis, diverticulitis, appendicitis   Co morbidities that complicate the patient evaluation  See HPI   Additional history obtained:  Additional history obtained from EMR External records from outside source obtained and reviewed including hospital records   Lab Tests:  I Ordered, and personally interpreted labs.  The pertinent results include: Pancytopenia with leukopenia 3.9, anemia of 11.4 and thrombocytopenia of 114 oh which is chronic in nature.  Patient with evidence of hyponatremia, decrease in bicarb but otherwise electrolytes within normal range.  Patient with evidence of AKI I with creatinine 2.67, BUN of 48 and GFR of 26 with elevation on of both alkaline phosphatase and total bilirubin of 1 50-1.6 respectively.  Lipase mildly elevated at 53.  VBG without hypercarbia or acidosis.  Ammonia within normal limits.  UA concerning for infection with hematuria with large leukocyte, many bacteria, greater than 50 WBC and RBC respectively with greater than 300 proteins.  UDS, magnesium, phosphorus, TSH pending.   Imaging Studies ordered:  I ordered imaging studies including chest x-Jeudy, CT chest abdomen pelvis, CT head I independently visualized and interpreted imaging which showed chest x-Morocho showed enlarged cardio pericardial silhouette.  CT head/chest abdomen pelvis pending upon shift change I agree with the radiologist interpretation   Cardiac Monitoring: / EKG:  The patient was maintained on a cardiac monitor.  I personally viewed and interpreted the cardiac monitored which showed an underlying rhythm of: Atrial fibrillation with slow ventricular response without significant change from prior EKGs performed   Consultations Obtained:  I requested consultation with attending physician Dr. Vallery Ridge who independently evaluated the patient and was in agreement with treatment plan going forward.  The cause of patient's symptoms undifferentiated at this point.  Difficult to  ascertain details of HPI given hypersomnolence of patient.  Patient without any overt acute neurologic deficits per exam.  No obvious wheeze, rales, rhonchi auscultated on respiratory exam.  Trial of Narcan given concern for possible opioid use given hypersomnolent nature of patient without significant improvement of symptoms.  Still query possible  prescription medication overuse etiology versus infectious etiology versus intracranial etiology versus metabolic etiology versus other etiology.  As patient was examined serially, abdominal pain began to become more prominent of the complaint.  Awaiting assessment via CT head, chest abdomen pelvis.  Unsure of exact etiology of patient's symptoms at this time with workup still pending. Patient care handed off to Dr. Vallery Ridge at shift change.  Patient stable upon shift change.        Final Clinical Impression(s) / ED Diagnoses Final diagnoses:  None    Rx / DC Orders ED Discharge Orders     None         Wilnette Kales, [place] 06/01/22 2041    Charlesetta Shanks, MD 06/01/22 2142

## 2022-06-01 NOTE — ED Notes (Signed)
Patient back from CT.

## 2022-06-01 NOTE — ED Notes (Signed)
Both RN and this NT have tried getting both an oral and an axillary temp on this pt and have been unsuccessful after trying multiple different thermometers.

## 2022-06-02 ENCOUNTER — Other Ambulatory Visit (HOSPITAL_COMMUNITY): Payer: 59

## 2022-06-02 DIAGNOSIS — J90 Pleural effusion, not elsewhere classified: Secondary | ICD-10-CM | POA: Diagnosis not present

## 2022-06-02 DIAGNOSIS — E872 Acidosis, unspecified: Secondary | ICD-10-CM | POA: Diagnosis present

## 2022-06-02 DIAGNOSIS — I25119 Atherosclerotic heart disease of native coronary artery with unspecified angina pectoris: Secondary | ICD-10-CM | POA: Diagnosis not present

## 2022-06-02 DIAGNOSIS — R570 Cardiogenic shock: Secondary | ICD-10-CM | POA: Diagnosis not present

## 2022-06-02 DIAGNOSIS — I350 Nonrheumatic aortic (valve) stenosis: Secondary | ICD-10-CM

## 2022-06-02 DIAGNOSIS — I130 Hypertensive heart and chronic kidney disease with heart failure and stage 1 through stage 4 chronic kidney disease, or unspecified chronic kidney disease: Secondary | ICD-10-CM | POA: Diagnosis not present

## 2022-06-02 DIAGNOSIS — A4150 Gram-negative sepsis, unspecified: Secondary | ICD-10-CM | POA: Diagnosis not present

## 2022-06-02 DIAGNOSIS — I10 Essential (primary) hypertension: Secondary | ICD-10-CM | POA: Diagnosis not present

## 2022-06-02 DIAGNOSIS — R1013 Epigastric pain: Secondary | ICD-10-CM | POA: Diagnosis present

## 2022-06-02 DIAGNOSIS — I472 Ventricular tachycardia, unspecified: Secondary | ICD-10-CM | POA: Diagnosis not present

## 2022-06-02 DIAGNOSIS — I4821 Permanent atrial fibrillation: Secondary | ICD-10-CM | POA: Diagnosis present

## 2022-06-02 DIAGNOSIS — Z1611 Resistance to penicillins: Secondary | ICD-10-CM | POA: Diagnosis present

## 2022-06-02 DIAGNOSIS — D696 Thrombocytopenia, unspecified: Secondary | ICD-10-CM

## 2022-06-02 DIAGNOSIS — D631 Anemia in chronic kidney disease: Secondary | ICD-10-CM | POA: Diagnosis not present

## 2022-06-02 DIAGNOSIS — D509 Iron deficiency anemia, unspecified: Secondary | ICD-10-CM | POA: Diagnosis present

## 2022-06-02 DIAGNOSIS — N179 Acute kidney failure, unspecified: Secondary | ICD-10-CM | POA: Diagnosis not present

## 2022-06-02 DIAGNOSIS — N3080 Other cystitis without hematuria: Secondary | ICD-10-CM | POA: Diagnosis not present

## 2022-06-02 DIAGNOSIS — J189 Pneumonia, unspecified organism: Secondary | ICD-10-CM | POA: Insufficient documentation

## 2022-06-02 DIAGNOSIS — D649 Anemia, unspecified: Secondary | ICD-10-CM

## 2022-06-02 DIAGNOSIS — A419 Sepsis, unspecified organism: Secondary | ICD-10-CM | POA: Diagnosis not present

## 2022-06-02 DIAGNOSIS — I5023 Acute on chronic systolic (congestive) heart failure: Secondary | ICD-10-CM | POA: Diagnosis not present

## 2022-06-02 DIAGNOSIS — R0602 Shortness of breath: Secondary | ICD-10-CM | POA: Diagnosis not present

## 2022-06-02 DIAGNOSIS — I5033 Acute on chronic diastolic (congestive) heart failure: Secondary | ICD-10-CM | POA: Diagnosis not present

## 2022-06-02 DIAGNOSIS — E871 Hypo-osmolality and hyponatremia: Secondary | ICD-10-CM | POA: Diagnosis present

## 2022-06-02 DIAGNOSIS — Z006 Encounter for examination for normal comparison and control in clinical research program: Secondary | ICD-10-CM | POA: Diagnosis not present

## 2022-06-02 DIAGNOSIS — I5043 Acute on chronic combined systolic (congestive) and diastolic (congestive) heart failure: Secondary | ICD-10-CM | POA: Diagnosis not present

## 2022-06-02 DIAGNOSIS — I2729 Other secondary pulmonary hypertension: Secondary | ICD-10-CM | POA: Diagnosis not present

## 2022-06-02 DIAGNOSIS — N170 Acute kidney failure with tubular necrosis: Secondary | ICD-10-CM | POA: Diagnosis not present

## 2022-06-02 DIAGNOSIS — R918 Other nonspecific abnormal finding of lung field: Secondary | ICD-10-CM

## 2022-06-02 DIAGNOSIS — Z1152 Encounter for screening for COVID-19: Secondary | ICD-10-CM | POA: Diagnosis not present

## 2022-06-02 DIAGNOSIS — I2510 Atherosclerotic heart disease of native coronary artery without angina pectoris: Secondary | ICD-10-CM | POA: Diagnosis not present

## 2022-06-02 DIAGNOSIS — I509 Heart failure, unspecified: Secondary | ICD-10-CM | POA: Diagnosis not present

## 2022-06-02 DIAGNOSIS — R6520 Severe sepsis without septic shock: Secondary | ICD-10-CM | POA: Diagnosis present

## 2022-06-02 DIAGNOSIS — I2583 Coronary atherosclerosis due to lipid rich plaque: Secondary | ICD-10-CM | POA: Diagnosis not present

## 2022-06-02 DIAGNOSIS — Z01818 Encounter for other preprocedural examination: Secondary | ICD-10-CM | POA: Diagnosis not present

## 2022-06-02 DIAGNOSIS — I480 Paroxysmal atrial fibrillation: Secondary | ICD-10-CM | POA: Diagnosis not present

## 2022-06-02 DIAGNOSIS — Z952 Presence of prosthetic heart valve: Secondary | ICD-10-CM | POA: Diagnosis not present

## 2022-06-02 DIAGNOSIS — R188 Other ascites: Secondary | ICD-10-CM | POA: Diagnosis not present

## 2022-06-02 DIAGNOSIS — I739 Peripheral vascular disease, unspecified: Secondary | ICD-10-CM | POA: Diagnosis present

## 2022-06-02 DIAGNOSIS — G9341 Metabolic encephalopathy: Secondary | ICD-10-CM | POA: Insufficient documentation

## 2022-06-02 DIAGNOSIS — N390 Urinary tract infection, site not specified: Secondary | ICD-10-CM | POA: Diagnosis not present

## 2022-06-02 DIAGNOSIS — N189 Chronic kidney disease, unspecified: Secondary | ICD-10-CM | POA: Diagnosis not present

## 2022-06-02 DIAGNOSIS — N1832 Chronic kidney disease, stage 3b: Secondary | ICD-10-CM | POA: Diagnosis not present

## 2022-06-02 DIAGNOSIS — J439 Emphysema, unspecified: Secondary | ICD-10-CM | POA: Diagnosis present

## 2022-06-02 DIAGNOSIS — I5031 Acute diastolic (congestive) heart failure: Secondary | ICD-10-CM | POA: Diagnosis not present

## 2022-06-02 DIAGNOSIS — R079 Chest pain, unspecified: Secondary | ICD-10-CM

## 2022-06-02 DIAGNOSIS — I080 Rheumatic disorders of both mitral and aortic valves: Secondary | ICD-10-CM | POA: Diagnosis present

## 2022-06-02 DIAGNOSIS — I2489 Other forms of acute ischemic heart disease: Secondary | ICD-10-CM | POA: Diagnosis not present

## 2022-06-02 DIAGNOSIS — I5030 Unspecified diastolic (congestive) heart failure: Secondary | ICD-10-CM | POA: Diagnosis not present

## 2022-06-02 LAB — RESP PANEL BY RT-PCR (RSV, FLU A&B, COVID)  RVPGX2
Influenza A by PCR: NEGATIVE
Influenza B by PCR: NEGATIVE
Resp Syncytial Virus by PCR: NEGATIVE
SARS Coronavirus 2 by RT PCR: NEGATIVE

## 2022-06-02 LAB — CBC WITH DIFFERENTIAL/PLATELET
Abs Immature Granulocytes: 0 10*3/uL (ref 0.00–0.07)
Basophils Absolute: 0 10*3/uL (ref 0.0–0.1)
Basophils Relative: 0 %
Eosinophils Absolute: 0 10*3/uL (ref 0.0–0.5)
Eosinophils Relative: 0 %
HCT: 36.3 % — ABNORMAL LOW (ref 39.0–52.0)
Hemoglobin: 12.4 g/dL — ABNORMAL LOW (ref 13.0–17.0)
Lymphocytes Relative: 39 %
Lymphs Abs: 1.2 10*3/uL (ref 0.7–4.0)
MCH: 25.8 pg — ABNORMAL LOW (ref 26.0–34.0)
MCHC: 34.2 g/dL (ref 30.0–36.0)
MCV: 75.6 fL — ABNORMAL LOW (ref 80.0–100.0)
Monocytes Absolute: 0.2 10*3/uL (ref 0.1–1.0)
Monocytes Relative: 6 %
Neutro Abs: 1.8 10*3/uL (ref 1.7–7.7)
Neutrophils Relative %: 55 %
Platelets: 113 10*3/uL — ABNORMAL LOW (ref 150–400)
RBC: 4.8 MIL/uL (ref 4.22–5.81)
RDW: 24.1 % — ABNORMAL HIGH (ref 11.5–15.5)
WBC: 3.2 10*3/uL — ABNORMAL LOW (ref 4.0–10.5)
nRBC: 2 /100 WBC — ABNORMAL HIGH
nRBC: 8.5 % — ABNORMAL HIGH (ref 0.0–0.2)

## 2022-06-02 LAB — COMPREHENSIVE METABOLIC PANEL
ALT: 24 U/L (ref 0–44)
AST: 25 U/L (ref 15–41)
Albumin: 2.3 g/dL — ABNORMAL LOW (ref 3.5–5.0)
Alkaline Phosphatase: 135 U/L — ABNORMAL HIGH (ref 38–126)
Anion gap: 11 (ref 5–15)
BUN: 50 mg/dL — ABNORMAL HIGH (ref 8–23)
CO2: 17 mmol/L — ABNORMAL LOW (ref 22–32)
Calcium: 9.1 mg/dL (ref 8.9–10.3)
Chloride: 107 mmol/L (ref 98–111)
Creatinine, Ser: 2.88 mg/dL — ABNORMAL HIGH (ref 0.61–1.24)
GFR, Estimated: 24 mL/min — ABNORMAL LOW (ref >60)
Glucose, Bld: 115 mg/dL — ABNORMAL HIGH (ref 70–99)
Potassium: 6.1 mmol/L — ABNORMAL HIGH (ref 3.5–5.1)
Sodium: 135 mmol/L (ref 135–145)
Total Bilirubin: 1.2 mg/dL (ref 0.3–1.2)
Total Protein: 5.6 g/dL — ABNORMAL LOW (ref 6.5–8.1)

## 2022-06-02 LAB — RAPID URINE DRUG SCREEN, HOSP PERFORMED
Amphetamines: NOT DETECTED
Barbiturates: NOT DETECTED
Benzodiazepines: NOT DETECTED
Cocaine: NOT DETECTED
Opiates: NOT DETECTED
Tetrahydrocannabinol: NOT DETECTED

## 2022-06-02 LAB — LACTIC ACID, PLASMA
Lactic Acid, Venous: 2.7 mmol/L (ref 0.5–1.9)
Lactic Acid, Venous: 2 mmol/L (ref 0.5–1.9)

## 2022-06-02 LAB — MAGNESIUM: Magnesium: 1.8 mg/dL (ref 1.7–2.4)

## 2022-06-02 LAB — MRSA NEXT GEN BY PCR, NASAL: MRSA by PCR Next Gen: NOT DETECTED

## 2022-06-02 LAB — PROCALCITONIN: Procalcitonin: 0.39 ng/mL

## 2022-06-02 LAB — BRAIN NATRIURETIC PEPTIDE: B Natriuretic Peptide: 525.7 pg/mL — ABNORMAL HIGH (ref 0.0–100.0)

## 2022-06-02 MED ORDER — MAGNESIUM SULFATE IN D5W 1-5 GM/100ML-% IV SOLN
1.0000 g | Freq: Once | INTRAVENOUS | Status: AC
  Administered 2022-06-02: 1 g via INTRAVENOUS
  Filled 2022-06-02: qty 100

## 2022-06-02 MED ORDER — DEXTROSE 50 % IV SOLN
50.0000 mL | Freq: Once | INTRAVENOUS | Status: AC
  Administered 2022-06-02: 50 mL via INTRAVENOUS
  Filled 2022-06-02: qty 50

## 2022-06-02 MED ORDER — SODIUM ZIRCONIUM CYCLOSILICATE 10 G PO PACK
10.0000 g | PACK | Freq: Once | ORAL | Status: AC
  Administered 2022-06-02: 10 g via ORAL
  Filled 2022-06-02: qty 1

## 2022-06-02 MED ORDER — SODIUM BICARBONATE 8.4 % IV SOLN
50.0000 meq | Freq: Once | INTRAVENOUS | Status: AC
  Administered 2022-06-02: 50 meq via INTRAVENOUS
  Filled 2022-06-02: qty 50

## 2022-06-02 MED ORDER — ATORVASTATIN CALCIUM 10 MG PO TABS
20.0000 mg | ORAL_TABLET | Freq: Every day | ORAL | Status: DC
  Administered 2022-06-02 – 2022-06-13 (×12): 20 mg via ORAL
  Filled 2022-06-02 (×12): qty 2

## 2022-06-02 MED ORDER — CALCIUM GLUCONATE-NACL 1-0.675 GM/50ML-% IV SOLN
1.0000 g | Freq: Once | INTRAVENOUS | Status: AC
  Administered 2022-06-02: 1000 mg via INTRAVENOUS
  Filled 2022-06-02: qty 50

## 2022-06-02 MED ORDER — SODIUM CHLORIDE 0.9 % IV SOLN
100.0000 mg | Freq: Two times a day (BID) | INTRAVENOUS | Status: DC
  Administered 2022-06-02 – 2022-06-03 (×4): 100 mg via INTRAVENOUS
  Filled 2022-06-02 (×4): qty 100

## 2022-06-02 MED ORDER — INSULIN ASPART 100 UNIT/ML IV SOLN
5.0000 [IU] | Freq: Once | INTRAVENOUS | Status: AC
  Administered 2022-06-02: 5 [IU] via INTRAVENOUS

## 2022-06-02 MED ORDER — ASPIRIN 81 MG PO TBEC
81.0000 mg | DELAYED_RELEASE_TABLET | Freq: Every day | ORAL | Status: DC
  Administered 2022-06-02 – 2022-06-06 (×5): 81 mg via ORAL
  Filled 2022-06-02 (×5): qty 1

## 2022-06-02 MED ORDER — APIXABAN 5 MG PO TABS
5.0000 mg | ORAL_TABLET | Freq: Two times a day (BID) | ORAL | Status: DC
  Administered 2022-06-02 – 2022-06-06 (×11): 5 mg via ORAL
  Filled 2022-06-02 (×11): qty 1

## 2022-06-02 MED ORDER — ACETAMINOPHEN 325 MG PO TABS
650.0000 mg | ORAL_TABLET | Freq: Four times a day (QID) | ORAL | Status: DC | PRN
  Administered 2022-06-04 – 2022-06-05 (×3): 650 mg via ORAL
  Filled 2022-06-02 (×3): qty 2

## 2022-06-02 MED ORDER — SODIUM CHLORIDE 0.9 % IV SOLN
2.0000 g | INTRAVENOUS | Status: DC
  Administered 2022-06-02 – 2022-06-03 (×2): 2 g via INTRAVENOUS
  Filled 2022-06-02 (×2): qty 20

## 2022-06-02 NOTE — Progress Notes (Signed)
Heart Failure Navigator Progress Note  Assessed for Heart & Vascular TOC clinic readiness.  Patient EF 55%, has a scheduled [facility] follow up appointment on 06/08/2022. .   Navigator will sign off at this time.   Earnestine Leys, BSN, Clinical cytogeneticist Only

## 2022-06-02 NOTE — Progress Notes (Signed)
   Heart Failure Stewardship Pharmacist Progress Note   PCP: [name], MD PCP-Cardiologist: Donato Heinz, MD    HPI:  62 yo M with PMH of severe aortic stenosis, CAD, CHF, afib, PAD, gout, CKD, CVA, HTN, HLD, OSA and GERD.  Presented to the ED on 2/28 with chest pain, shortness of breath, orthopnea, and abdominal pain. He had outpatient labs done the day before which were abnormal and was sent to the ED. He has been off all of his medications for several months and did not contact his PCP to request refills with challenging social situation.   It was noted in the ED that he was hypothermic with a temperature of 95.9 F and hypotensive and systolic pressures in the 80s. Patient admitted for severe sepsis secondary to acute complicated UTI/emphysematous cystitis vs PNA, acute on chronic HFpEF, AKI on CKD, chest pain with trops of 64 and down trending, hyperkalemia (6.1) and hypotension which now appear to be normotensive. BNP is elevated at 525, but lasix had not been given with concerns of sepsis and hypotension. Evaluated by cardiology and has since started IV lasix and resuming some GDMT.  ECHO on 3/1 showed LVEF reduced to 45-50%, severe concentric LVH, mildly elevated pulmonary artery systolic pressure, severe aortic stenosis. He is undergoing evaluation for TAVR but has been delayed 2/2 recurrent infections.   Current HF Medications: Diuretic: furosemide 40 mg IV BID Beta Blocker: carvedilol 12.5 mg BID Other: hydralazine 25 mg TID  Prior to admission HF Medications: *noncompliance with the medications listed below: Diuretic: furosemide 40 mg daily Beta blocker: carvedilol 25 mg BID ACE/ARB/ARNI: olmesartan 40 mg daily MRA: spironolactone 50 mg daily SGLT2i: Farxiga 10 mg daily Other: hydralazine 75 mg TID  Pertinent Lab Values: Serum creatinine 3.49, BUN 54, Potassium 4.7, Sodium 134, BNP 525.7, Magnesium 1.8  Vital Signs: Weight: 188 lbs (admission weight:  187 lbs) Blood pressure: 130-150/90s  Heart rate: 80-90s  I/O: minimal output documented  Medication Assistance / Insurance Benefits Check: Does the patient have prescription insurance?  Yes Type of insurance plan: [place] Medicaid  Outpatient Pharmacy:  Prior to admission outpatient pharmacy: [facility] Is the patient willing to use [facility] at discharge? Yes Is the patient willing to transition their outpatient pharmacy to utilize a [facility] outpatient pharmacy?   Pending    Assessment: 1. Acute on chronic diastolic CHF (LVEF [id number]). NYHA class III symptoms. - Continue furosemide 40 mg IV BID. Strict I/Os and daily weights. Keep K>4 and Mg>2. - Agree with restarting carvedilol at 12.5 mg BID - Holding ARB and MRA with AKI on CKD - Holding Farxiga with sepsis UTI - Agree with restarting hydralazine at 25 mg TID with BP starting to rise   Plan: 1) Medication changes recommended at this time: Caryl Never with changes  2) Patient assistance: - None pending  3)  Education  - To be completed prior to discharge  Kerby Nora, PharmD, BCPS Heart Failure Stewardship Pharmacist Phone [phone]

## 2022-06-02 NOTE — TOC Initial Note (Signed)
Transition of Care [facility]) - Initial/Assessment Note    Patient Details  Name: [name] MRN: [id number] Date of Birth: 1961-02-06  [facility]) CM/SW Contact:    Verdell Carmine, RN Phone Number: 06/02/2022, 8:48 AM  Clinical Narrative:                 Transitions of Care (TOC) has reviewed the patient information and found no needs at this time. The patient will be discussed in daily  progressives rounds. 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   Pneumonia 06/02/2022   Chest pain 0000000   Acute metabolic encephalopathy 0000000   Anemia 06/02/2022   Thrombocytopenia ([place]) 06/02/2022   Hyponatremia 06/02/2022   Pulmonary nodules 06/02/2022   Syncope 03/24/2022   (HFpEF) heart failure with preserved ejection fraction ([place]) 03/24/2022   Hypomagnesemia 03/24/2022   Atrial fibrillation with RVR ([place]) 02/18/2022   CAD (coronary artery disease) 02/18/2022   SIRS (systemic inflammatory response syndrome) ([place]) 02/18/2022   Dizziness 02/18/2022   Urinary tract infection 02/18/2022   Abnormal chest x-[place] 02/18/2022   Nausea and vomiting 02/18/2022   History of CVA (cerebrovascular accident) 02/18/2022   History of septic arthritis 02/18/2022   Severe sepsis ([place]) 01/15/2022   Septic arthritis of knee, right (HCC)    Obesity (BMI 30-39.9) 01/03/2022   Iron deficiency anemia 01/03/2022   Gout of right knee 01/03/2022   Sepsis due to group A Streptococcus with  acute renal failure without septic shock (HCC)    Acute kidney injury superimposed on chronic kidney disease ([place]) 01/01/2022   Magnesium deficiency 11/23/2021   PAD (peripheral artery disease) ([place]) 09/20/2021   Open wound of lower extremity, right, initial encounter 09/20/2021   Superior mesenteric artery stenosis ([place]) 06/23/2021   Severe aortic stenosis    Persistent atrial fibrillation ([place]) 05/05/2021   Secondary hypercoagulable state ([place]) 05/05/2021   Chronic back pain 11/05/2020   History of chronic skin ulcer 11/05/2020   OA (osteoarthritis) 11/05/2020   OSA (obstructive sleep apnea) 11/05/2020   Chronic kidney disease, stage 3a ([place]) 02/19/2020   Acute on chronic heart failure with preserved ejection fraction ([place]) 07/25/2017   Pain and swelling of right knee    Hypertensive urgency 08/30/2013   HTN (hypertension) 10/19/2011   Gout 10/19/2011   Hyperlipemia 10/19/2011   GERD (gastroesophageal reflux disease) 08/27/2011   PCP:  [name], MD Pharmacy:   [facility] [id number] - [place], [address] AT [place]  [place] [address] [place]  [id number] Phone: [phone] Fax: [phone]     Social Determinants of Health (SDOH) Social History: SDOH Screenings   Food Insecurity: No Food Insecurity (06/02/2022)  Housing: High Risk (06/02/2022)  Transportation Needs: No Transportation Needs (06/02/2022)  Utilities: Not At Risk (06/02/2022)  Alcohol Screen: Low Risk  (09/30/2021)  Depression (PHQ2-9): Medium Risk (05/31/2022)  Financial Resource Strain: High Risk (04/07/2020)  Physical Activity: Insufficiently Active (09/30/2021)  Social Connections: Moderately Integrated (09/30/2021)  Stress: No Stress Concern Present (09/30/2021)  Tobacco Use: Low Risk  (05/31/2022)   SDOH Interventions: Housing Interventions: Other (Comment) (no permanent housing, ;living in a hotel with wife and 3 kids.) Transportation  Interventions: Intervention Not Indicated   Readmission Risk Interventions    01/19/2022    3:56 PM  Readmission Risk Prevention Plan  Transportation Screening Complete  Home Care Screening Complete  Medication Review (RN CM) Complete
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
## 2022-06-02 NOTE — Progress Notes (Signed)
Lactic acid is 2.7. made Dr Marlowe Sax aware. BP (!) 113/95 (BP Location: Left Arm)   Pulse 65   Temp (!) 96.2 F (35.7 C) (Rectal) Comment: gave warm blanket  Resp 13   Ht '5\' 9"'$  (1.753 m)   Wt 85 kg   SpO2 92%   BMI 27.67 kg/m  Continue monitor

## 2022-06-02 NOTE — Consult Note (Signed)
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 shortness of breath but not currently complaining of. Consider restarting medical therapy if indicated once his blood pressure normalizes. Last BNP was 525. Consider a trial single IV dose of Lasix with close monitoring of kidney function. - 2+ pitting edema in BLE - Echo pending - up 2kg since admission   Persistent A-fib - Previously on carvedilol and diltiazem. Hold off given low BP and HR of 60-70s. Started back on eliquis at time of admission.   - Not a candidate for cardiac ablation or cardioversion given infection and lack of anticoagulation. - Continue cardiac monitoring  CKD stage IIIa - Creatinine 2.88 with a baseline normally around 1.3-1.8. - Continue to monitor I/0. +1,568. Only urinated once since  admission.  Hyperkalemia - Potassium 6.1 this morning and was given lokelma.  - Will repeat labs  Hypertension - Initially hypotensive with multiple readings under [id number] systolic.  However, now has stabilized in the 0000000 to [id number] systolic for the past 12 hrs.  Hyperlipidemia - Continue atorvastatin 20 mg - History of aortic atherosclerosis - Multi-vessel coronary artery calcifications per CT scan   CAD - Continue aspirin 81 mg  Chest pain - Currently denies, troponin elevated at 64 at time of admission but is downtrending.Suspect from demand ischemia exacerbated by infection. - Restarted on aspirin and Lipitor  *Will consult with supervising MD about treatment plan.   New York Heart Association (NYHA) Functional Class NYHA Class II  CHA2DS2-VASc Score = 3  This indicates a 3.2% annual risk of stroke. The patient's score is based upon: CHF History: 1 HTN History: 1 Diabetes History: 0 Stroke History: 0 Vascular Disease History: 1 Age Score: 0 Gender Score: 0        For questions or updates, please contact Gerald Please consult [email or web address] for contact info under    Signed, Bonnee Quin, PA-C  06/02/2022 12:55 PM

## 2022-06-02 NOTE — Progress Notes (Signed)
Potassium is 6.1. notified Dr Sidney Ace. Orders in. Continue to monitor.
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
## 2022-06-03 ENCOUNTER — Encounter (HOSPITAL_COMMUNITY)
Admission: EM | Disposition: A | Discharge status: Home Health | Payer: Self-pay | Source: Home / Self Care | Attending: Internal Medicine

## 2022-06-03 ENCOUNTER — Inpatient Hospital Stay (HOSPITAL_COMMUNITY): Payer: 59

## 2022-06-03 ENCOUNTER — Encounter (HOSPITAL_COMMUNITY): Payer: Self-pay | Admitting: Internal Medicine

## 2022-06-03 ENCOUNTER — Other Ambulatory Visit: Payer: Self-pay

## 2022-06-03 DIAGNOSIS — I5030 Unspecified diastolic (congestive) heart failure: Secondary | ICD-10-CM

## 2022-06-03 DIAGNOSIS — N179 Acute kidney failure, unspecified: Secondary | ICD-10-CM | POA: Diagnosis not present

## 2022-06-03 DIAGNOSIS — I5033 Acute on chronic diastolic (congestive) heart failure: Secondary | ICD-10-CM | POA: Diagnosis not present

## 2022-06-03 DIAGNOSIS — I5031 Acute diastolic (congestive) heart failure: Secondary | ICD-10-CM

## 2022-06-03 DIAGNOSIS — N3080 Other cystitis without hematuria: Secondary | ICD-10-CM | POA: Diagnosis not present

## 2022-06-03 DIAGNOSIS — R570 Cardiogenic shock: Secondary | ICD-10-CM

## 2022-06-03 DIAGNOSIS — N189 Chronic kidney disease, unspecified: Secondary | ICD-10-CM | POA: Diagnosis not present

## 2022-06-03 HISTORY — PX: RIGHT HEART CATH: CATH118263

## 2022-06-03 HISTORY — PX: CENTRAL LINE INSERTION: CATH118232

## 2022-06-03 LAB — POCT I-STAT EG7
Acid-base deficit: 4 mmol/L — ABNORMAL HIGH (ref 0.0–2.0)
Acid-base deficit: 2 mmol/L (ref 0.0–2.0)
Bicarbonate: 21.4 mmol/L (ref 20.0–28.0)
Bicarbonate: 22.5 mmol/L (ref 20.0–28.0)
Calcium, Ion: 1.23 mmol/L (ref 1.15–1.40)
Calcium, Ion: 1.25 mmol/L (ref 1.15–1.40)
HCT: 39 % (ref 39.0–52.0)
HCT: 39 % (ref 39.0–52.0)
Hemoglobin: 13.3 g/dL (ref 13.0–17.0)
Hemoglobin: 13.3 g/dL (ref 13.0–17.0)
O2 Saturation: 56 %
O2 Saturation: 56 %
Potassium: 3.8 mmol/L (ref 3.5–5.1)
Potassium: 4 mmol/L (ref 3.5–5.1)
Sodium: 141 mmol/L (ref 135–145)
Sodium: 140 mmol/L (ref 135–145)
TCO2: 23 mmol/L (ref 22–32)
TCO2: 24 mmol/L (ref 22–32)
pCO2, Ven: 37.8 mmHg — ABNORMAL LOW (ref 44–60)
pCO2, Ven: 38.4 mmHg — ABNORMAL LOW (ref 44–60)
pH, Ven: 7.361 (ref 7.25–7.43)
pH, Ven: 7.375 (ref 7.25–7.43)
pO2, Ven: 30 mmHg — CL (ref 32–45)
pO2, Ven: 30 mmHg — CL (ref 32–45)

## 2022-06-03 LAB — CBC WITH DIFFERENTIAL/PLATELET
Abs Immature Granulocytes: 0.01 10*3/uL (ref 0.00–0.07)
Basophils Absolute: 0 10*3/uL (ref 0.0–0.1)
Basophils Relative: 1 %
Eosinophils Absolute: 0.1 10*3/uL (ref 0.0–0.5)
Eosinophils Relative: 2 %
HCT: 34.6 % — ABNORMAL LOW (ref 39.0–52.0)
Hemoglobin: 11.7 g/dL — ABNORMAL LOW (ref 13.0–17.0)
Immature Granulocytes: 0 %
Lymphocytes Relative: 42 %
Lymphs Abs: 2 10*3/uL (ref 0.7–4.0)
MCH: 25.3 pg — ABNORMAL LOW (ref 26.0–34.0)
MCHC: 33.8 g/dL (ref 30.0–36.0)
MCV: 74.7 fL — ABNORMAL LOW (ref 80.0–100.0)
Monocytes Absolute: 0.4 10*3/uL (ref 0.1–1.0)
Monocytes Relative: 8 %
Neutro Abs: 2.3 10*3/uL (ref 1.7–7.7)
Neutrophils Relative %: 47 %
Platelets: 134 10*3/uL — ABNORMAL LOW (ref 150–400)
RBC: 4.63 MIL/uL (ref 4.22–5.81)
RDW: 23.5 % — ABNORMAL HIGH (ref 11.5–15.5)
WBC: 4.8 10*3/uL (ref 4.0–10.5)
nRBC: 8.1 % — ABNORMAL HIGH (ref 0.0–0.2)

## 2022-06-03 LAB — ECHOCARDIOGRAM COMPLETE
AR max vel: 0.95 cm2
AV Area VTI: 0.82 cm2
AV Area mean vel: 0.9 cm2
AV Mean grad: 32 mmHg
AV Peak grad: 52.4 mmHg
Ao pk vel: 3.62 m/s
Height: 69 in
MV M vel: 5.47 m/s
MV Peak grad: 119.7 mmHg
P 1/2 time: 746 msec
Radius: 1.2 cm
S' Lateral: 3.2 cm
Weight: 2998.26 oz

## 2022-06-03 LAB — BASIC METABOLIC PANEL
Anion gap: 11 (ref 5–15)
BUN: 54 mg/dL — ABNORMAL HIGH (ref 8–23)
CO2: 16 mmol/L — ABNORMAL LOW (ref 22–32)
Calcium: 8.9 mg/dL (ref 8.9–10.3)
Chloride: 107 mmol/L (ref 98–111)
Creatinine, Ser: 3.49 mg/dL — ABNORMAL HIGH (ref 0.61–1.24)
GFR, Estimated: 19 mL/min — ABNORMAL LOW (ref >60)
Glucose, Bld: 138 mg/dL — ABNORMAL HIGH (ref 70–99)
Potassium: 4.7 mmol/L (ref 3.5–5.1)
Sodium: 134 mmol/L — ABNORMAL LOW (ref 135–145)

## 2022-06-03 LAB — COOXEMETRY PANEL
Carboxyhemoglobin: 4 % — ABNORMAL HIGH (ref 0.5–1.5)
Methemoglobin: <0.7 % (ref 0.0–1.5)
O2 Saturation: 64.6 %
Total hemoglobin: 11.1 g/dL — ABNORMAL LOW (ref 12.0–16.0)

## 2022-06-03 LAB — LACTIC ACID, PLASMA: Lactic Acid, Venous: 2.4 mmol/L (ref 0.5–1.9)

## 2022-06-03 LAB — PATHOLOGIST SMEAR REVIEW

## 2022-06-03 SURGERY — RIGHT HEART CATH
Anesthesia: LOCAL

## 2022-06-03 MED ORDER — FUROSEMIDE 10 MG/ML IJ SOLN
INTRAMUSCULAR | Status: DC | PRN
  Administered 2022-06-03: 80 mg via INTRAVENOUS

## 2022-06-03 MED ORDER — ASPIRIN 81 MG PO CHEW: 81.0000 mg | CHEWABLE_TABLET | ORAL | Status: DC

## 2022-06-03 MED ORDER — SODIUM CHLORIDE 0.9 % IV SOLN: 250.0000 mL | INTRAVENOUS | Status: DC | PRN

## 2022-06-03 MED ORDER — MILRINONE LACTATE IN DEXTROSE 20-5 MG/100ML-% IV SOLN
0.1250 ug/kg/min | INTRAVENOUS | Status: DC
  Administered 2022-06-03: 0.25 ug/kg/min via INTRAVENOUS
  Administered 2022-06-04: 0.125 ug/kg/min via INTRAVENOUS
  Administered 2022-06-04: 0.25 ug/kg/min via INTRAVENOUS
  Filled 2022-06-03 (×3): qty 100

## 2022-06-03 MED ORDER — FUROSEMIDE 10 MG/ML IJ SOLN
20.0000 mg | Freq: Once | INTRAMUSCULAR | Status: AC
  Administered 2022-06-03: 20 mg via INTRAVENOUS
  Filled 2022-06-03: qty 2

## 2022-06-03 MED ORDER — LIDOCAINE HCL (PF) 1 % IJ SOLN
INTRAMUSCULAR | Status: AC
  Filled 2022-06-03: qty 30

## 2022-06-03 MED ORDER — MILRINONE LACTATE IN DEXTROSE 20-5 MG/100ML-% IV SOLN
INTRAVENOUS | Status: AC | PRN
  Administered 2022-06-03: .25 ug/kg/min via INTRAVENOUS

## 2022-06-03 MED ORDER — SODIUM CHLORIDE 0.9% FLUSH
3.0000 mL | Freq: Two times a day (BID) | INTRAVENOUS | Status: DC
  Administered 2022-06-03 – 2022-06-13 (×19): 3 mL via INTRAVENOUS

## 2022-06-03 MED ORDER — FUROSEMIDE 10 MG/ML IJ SOLN
40.0000 mg | Freq: Two times a day (BID) | INTRAMUSCULAR | Status: DC
  Administered 2022-06-03: 40 mg via INTRAVENOUS
  Filled 2022-06-03: qty 4

## 2022-06-03 MED ORDER — SODIUM CHLORIDE 0.9 % IV SOLN: INTRAVENOUS | Status: DC

## 2022-06-03 MED ORDER — SODIUM CHLORIDE 0.9 % IV SOLN
1.0000 g | INTRAVENOUS | Status: DC
  Administered 2022-06-04 – 2022-06-05 (×2): 1 g via INTRAVENOUS
  Filled 2022-06-03 (×2): qty 10

## 2022-06-03 MED ORDER — HEPARIN (PORCINE) IN NACL 1000-0.9 UT/500ML-% IV SOLN
INTRAVENOUS | Status: DC | PRN
  Administered 2022-06-03: 500 mL

## 2022-06-03 MED ORDER — HYDRALAZINE HCL 25 MG PO TABS
25.0000 mg | ORAL_TABLET | Freq: Three times a day (TID) | ORAL | Status: DC
  Administered 2022-06-03 – 2022-06-04 (×3): 25 mg via ORAL
  Filled 2022-06-03 (×3): qty 1

## 2022-06-03 MED ORDER — ALBUMIN HUMAN 25 % IV SOLN
25.0000 g | Freq: Four times a day (QID) | INTRAVENOUS | Status: AC
  Administered 2022-06-03 – 2022-06-04 (×3): 25 g via INTRAVENOUS
  Filled 2022-06-03 (×3): qty 100

## 2022-06-03 MED ORDER — CARVEDILOL 12.5 MG PO TABS: 12.5000 mg | ORAL_TABLET | Freq: Two times a day (BID) | ORAL | Status: DC

## 2022-06-03 MED ORDER — SODIUM BICARBONATE 650 MG PO TABS
650.0000 mg | ORAL_TABLET | Freq: Two times a day (BID) | ORAL | Status: DC
  Administered 2022-06-03 – 2022-06-04 (×3): 650 mg via ORAL
  Filled 2022-06-03 (×3): qty 1

## 2022-06-03 MED ORDER — SODIUM CHLORIDE 0.9% FLUSH: 3.0000 mL | INTRAVENOUS | Status: DC | PRN

## 2022-06-03 MED ORDER — LIDOCAINE HCL (PF) 1 % IJ SOLN
INTRAMUSCULAR | Status: DC | PRN
  Administered 2022-06-03: 2 mL

## 2022-06-03 MED ORDER — FUROSEMIDE 10 MG/ML IJ SOLN
INTRAMUSCULAR | Status: AC
  Filled 2022-06-03: qty 8

## 2022-06-03 MED ORDER — CHLORHEXIDINE GLUCONATE CLOTH 2 % EX PADS
6.0000 | MEDICATED_PAD | Freq: Every day | CUTANEOUS | Status: DC
  Administered 2022-06-04 – 2022-06-12 (×8): 6 via TOPICAL

## 2022-06-03 MED ORDER — FUROSEMIDE 10 MG/ML IJ SOLN
10.0000 mg/h | INTRAVENOUS | Status: DC
  Administered 2022-06-03 – 2022-06-04 (×2): 15 mg/h via INTRAVENOUS
  Administered 2022-06-04: 10 mg/h via INTRAVENOUS
  Filled 2022-06-03 (×3): qty 20

## 2022-06-03 MED ORDER — FUROSEMIDE 10 MG/ML IJ SOLN
80.0000 mg | Freq: Once | INTRAMUSCULAR | Status: DC
  Filled 2022-06-03: qty 8

## 2022-06-03 SURGICAL SUPPLY — 12 items
CATH SWAN GANZ 7F STRAIGHT (CATHETERS) IMPLANT
KIT CV MULTILUMEN 7FR 20 (SET/KITS/TRAYS/PACK) ×2
KIT CV MULTILUMEN 7FR 20 SUB (SET/KITS/TRAYS/PACK) IMPLANT
KIT MICROPUNCTURE NIT STIFF (SHEATH) IMPLANT
PACK CARDIAC CATHETERIZATION (CUSTOM PROCEDURE TRAY) ×2 IMPLANT
SHEATH PINNACLE 7F 10CM (SHEATH) IMPLANT
SHEATH PROBE COVER 6X72 (BAG) IMPLANT
SLEEVE REPOSITIONING LENGTH 30 (MISCELLANEOUS) IMPLANT
TRANSDUCER W/STOPCOCK (MISCELLANEOUS) ×2 IMPLANT
TUBING ART PRESS 72  MALE/FEM (TUBING) ×2
TUBING ART PRESS 72 MALE/FEM (TUBING) IMPLANT
WIRE EMERALD 3MM-J .025X260CM (WIRE) IMPLANT

## 2022-06-03 NOTE — Progress Notes (Signed)
Echo reviewed.  Worsening systolic function and mitral regurgitation is now severe.  RA pressure 8 mmHg.  Will attempt diuresis with lasix '40mg'$  IV x2.  Watch renal function closely.   Shawn Bolls C. Oval Linsey, MD, [facility] 06/03/2022 12:46 PM

## 2022-06-03 NOTE — Interval H&P Note (Signed)
History and Physical Interval Note:  06/03/2022 6:02 PM  [name]  has presented today for surgery, with the diagnosis of hf.  The various methods of treatment have been discussed with the patient and family. After consideration of risks, benefits and other options for treatment, the patient has consented to  Procedure(s): RIGHT HEART CATH (N/A) [place] as a surgical intervention.  The patient's history has been reviewed, patient examined, no change in status, stable for surgery.  I have reviewed the patient's chart and labs.  Questions were answered to the patient's satisfaction.     Demoni Parmar
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
## 2022-06-03 NOTE — Progress Notes (Addendum)
Rounding Note    Patient Name: [name] Date of Encounter: 06/03/2022  [place] Cardiologist: Donato Heinz, MD   Subjective   Acute shortness of breath overnight.  Received IV lasix.  No cardiopulmonary process on CXR.  Inpatient Medications    Scheduled Meds:  apixaban  5 mg Oral BID   aspirin EC  81 mg Oral Daily   atorvastatin  20 mg Oral Daily   Continuous Infusions:  cefTRIAXone (ROCEPHIN)  IV Stopped (06/02/22 1011)   doxycycline (VIBRAMYCIN) IV Stopped (06/02/22 2319)   PRN Meds: acetaminophen   Vital Signs    Vitals:   06/03/22 0245 06/03/22 0435 06/03/22 0500 06/03/22 0600  BP:  (!) 141/85 (!) 135/107 (!) 122/95  Pulse: 88 76 98 76  Resp:  18 (!) 22 16  Temp: 97.6 F (36.4 C)     TempSrc: Oral     SpO2: 97% 92% 90% 97%  Weight:      Height:        Intake/Output Summary (Last 24 hours) at 06/03/2022 0846 Last data filed at 06/03/2022 [id number] Gross per 24 hour  Intake 1440.02 ml  Output 425 ml  Net 1015.02 ml      06/02/2022    3:07 AM 06/01/2022    3:45 PM 05/31/2022    4:01 PM  Last 3 Weights  Weight (lbs) 187 lb 6.3 oz 183 lb 183 lb  Weight (kg) 85 kg 83.008 kg 83.008 kg      Telemetry    Atrial fibrillation.  PVCs.  Rate <100 bpm - Personally Reviewed  ECG    N/a - Personally Reviewed  Physical Exam   VS:  BP (!) 122/95   Pulse 76   Temp 97.6 F (36.4 C) (Oral)   Resp 16   Ht '5\' 9"'$  (1.753 m)   Wt 85 kg   SpO2 97%   BMI 27.67 kg/m  , BMI Body mass index is 27.67 kg/m. GENERAL:  Well appearing HEENT: Pupils equal round and reactive, fundi not visualized, oral mucosa unremarkable NECK:  No jugular venous distention, waveform within normal limits, carotid upstroke brisk and symmetric, no bruits, no thyromegaly LUNGS:  Clear to auscultation bilaterally HEART:  RRR.  PMI not displaced or sustained,S1 and S2 within normal limits, no S3, no S4, no clicks, no rubs, III/VI late-peaking systolic murmur ABD:  Flat,  positive bowel sounds normal in frequency in pitch, no bruits, no rebound, no guarding, no midline pulsatile mass, no hepatomegaly, no splenomegaly EXT:  2 plus pulses throughout, 2+ LE edema, no cyanosis no clubbing SKIN:  Chronic stasis dermatitis NEURO:  Cranial nerves II through XII grossly intact, motor grossly intact throughout PSYCH:  Cognitively intact, oriented to person place and time   Labs    High Sensitivity Troponin:   Recent Labs  Lab 06/01/22 1627 06/01/22 1824  TROPONINIHS 64* 60*     Chemistry Recent Labs  Lab 06/01/22 1627 06/01/22 1824 06/01/22 1829 06/02/22 0554 06/03/22 0020  NA 134*  --  134* 135 134*  K 5.0  --  6.2* 6.1* 4.7  CL 107  --   --  107 107  CO2 17*  --   --  17* 16*  GLUCOSE 120*  --   --  115* 138*  BUN 48*  --   --  50* 54*  CREATININE 2.67*  --   --  2.88* 3.49*  CALCIUM 9.0  --   --  9.1 8.9  MG  --  1.6*  --  1.8  --   PROT 5.7*  --   --  5.6*  --   ALBUMIN 2.4*  --   --  2.3*  --   AST 22  --   --  25  --   ALT 28  --   --  24  --   ALKPHOS 152*  --   --  135*  --   BILITOT 1.6*  --   --  1.2  --   GFRNONAA 26*  --   --  24* 19*  ANIONGAP 10  --   --  11 11    Lipids No results for input(s): "CHOL", "TRIG", "HDL", "LABVLDL", "LDLCALC", "CHOLHDL" in the last 168 hours.  Hematology Recent Labs  Lab 06/01/22 1629 06/01/22 1829 06/02/22 0554 06/03/22 0020  WBC 3.9*  --  3.2* 4.8  RBC 4.69  --  4.80 4.63  HGB 11.4* 14.3 12.4* 11.7*  HCT 35.9* 42.0 36.3* 34.6*  MCV 76.5*  --  75.6* 74.7*  MCH 24.3*  --  25.8* 25.3*  MCHC 31.8  --  34.2 33.8  RDW 24.0*  --  24.1* 23.5*  PLT 114*  --  113* 134*   Thyroid  Recent Labs  Lab 06/01/22 1824  TSH 3.735    BNP Recent Labs  Lab 05/31/22 1648 06/01/22 1629 06/02/22 0554  BNP 917.1* 670.6* 525.7*    DDimer No results for input(s): "DDIMER" in the last 168 hours.   Radiology    DG Chest 1 View  Result Date: 06/03/2022 CLINICAL DATA:  Shortness of breath EXAM: CHEST   1 VIEW COMPARISON:  Radiograph and CT 06/01/2022 FINDINGS: Stable cardiomegaly. Aortic atherosclerotic calcification. No focal consolidation, pleural effusion, or pneumothorax. No acute osseous abnormality. IMPRESSION: Stable cardiomegaly. No acute cardiopulmonary process. Electronically Signed   By: Placido Sou M.D.   On: 06/03/2022 02:14   US Abdomen Limited  Result Date: 06/01/2022 CLINICAL DATA:  Gallbladder wall thickening on recent abdomen and pelvis CT. EXAM: ULTRASOUND ABDOMEN LIMITED RIGHT UPPER QUADRANT COMPARISON:  None Available. FINDINGS: Gallbladder: A large amount of echogenic sludge is seen within the gallbladder lumen, without evidence of gallstones. Gallbladder wall measures 4.3 mm in thickness. A mild amount of pericholecystic fluid is also seen. No sonographic Murphy sign noted by sonographer. Common bile duct: Diameter: 3.8 mm Liver: No focal lesion identified. Within normal limits in parenchymal echogenicity. Portal vein is patent on color Doppler imaging with normal direction of blood flow towards the liver. Other: Of incidental note is the presence of a right pleural effusion and a mild amount of ascites. IMPRESSION: 1. Gallbladder sludge and gallbladder wall thickening, which may be secondary to the presence of ascites. 2. Right pleural effusion. Electronically Signed   By: Virgina Norfolk M.D.   On: 06/01/2022 23:14   CT CHEST ABDOMEN PELVIS WO CONTRAST  Addendum Date: 06/01/2022   ADDENDUM REPORT: 06/01/2022 20:28 ADDENDUM: Critical findings were reported to Dr. Johnney Killian at 8:27 p.m. Electronically Signed   By: Brett Fairy M.D.   On: 06/01/2022 20:28   Result Date: 06/01/2022 CLINICAL DATA:  Dyspnea, chronic, unclear etiology. Mental status change. EXAM: CT CHEST, ABDOMEN AND PELVIS WITHOUT CONTRAST TECHNIQUE: Multidetector CT imaging of the chest, abdomen and pelvis was performed following the standard protocol without IV contrast. RADIATION DOSE REDUCTION: This exam was  performed according to the departmental dose-optimization program which includes automated exposure control, adjustment of the mA and/or kV according to patient size and/or  use of iterative reconstruction technique. COMPARISON:  01/14/2022. FINDINGS: CT CHEST FINDINGS Cardiovascular: The heart is enlarged and there is no pericardial effusion. Three-vessel coronary artery calcifications are noted. There is atherosclerotic calcification of the aorta without evidence of aneurysm. The pulmonary trunk is normal in caliber. Mediastinum/Nodes: Enlarged lymph nodes are present in the mediastinum measuring up to 1.1 cm in short axis diameter in the precarinal space. No axillary lymphadenopathy. Evaluation of the hila is limited due to lack of IV contrast. The thyroid gland, trachea, and esophagus are within normal limits. Lungs/Pleura: There is a small right pleural effusion and trace left pleural effusion. Paraseptal and centrilobular emphysematous changes are present in the lungs. There is hazy ground-glass attenuation in the lungs bilaterally with a central predominance. Atelectasis is present bilaterally. Cluster of nodules is noted in the right middle lobe, the largest measuring 6 mm, axial image 87. No pneumothorax. Musculoskeletal: No acute or suspicious osseous abnormality. CT ABDOMEN PELVIS FINDINGS Hepatobiliary: No focal liver abnormality is seen. No gallstones. There is questionable gallbladder wall thickening versus pericholecystic edema. Pancreas: Unremarkable. No pancreatic ductal dilatation or surrounding inflammatory changes. Spleen: Normal in size without focal abnormality. Adrenals/Urinary Tract: The adrenal glands are within normal limits. There is a cyst in the lower pole of the right kidney. No renal calculus or hydronephrosis. 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 Stomach/Bowel: Stomach is within normal limits.  Appendix appears normal. No evidence of bowel wall thickening, distention, or inflammatory changes. No free air or pneumatosis. Few scattered diverticula are present along the colon without evidence of diverticulitis. Vascular/Lymphatic: Aortic atherosclerosis. Prominent lymph nodes are noted in the retroperitoneum measuring up to 1 cm in short axis diameter. There are enlarged lymph nodes in the inguinal regions bilaterally measuring up to 1.8 cm on the left. 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 Reproductive: Prostate is unremarkable. Other: Mild ascites in all 4 quadrants and mesenteric fat stranding. Fat containing inguinal hernias are noted bilaterally fat containing umbilical hernia is present. Anasarca is noted. Musculoskeletal: No acute osseous abnormality. IMPRESSION: 1. 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 2. Hazy ground-glass opacities in the lungs bilaterally with a central predominance, possible edema or infiltrate. 3. Cluster of nodules in the right middle lobe measuring up to 6 mm, which may be infectious or inflammatory. Non-contrast chest CT at 3-6 months is recommended. If the nodules are stable at time of repeat CT, then future CT at 18-24 months (from today's scan) is considered optional for low-risk patients, but is recommended for high-risk patients. This recommendation follows the consensus statement: Guidelines for Management of Incidental Pulmonary Nodules Detected on CT Images: From the Fleischner Society 2017; Radiology 2017; 284:228-243. 4. Small right pleural effusion and trace left pleural effusion. 5. Emphysema. 6. Mild ascites and anasarca. 7. Suggestion of gallbladder wall thickening versus pericholecystic edema which may be associated with local inflammatory changes or edema. Consider ultrasound for further evaluation. 8. 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 9. Nonspecific lymphadenopathy in the retroperitoneum and inguinal regions bilaterally, unchanged from the prior exam. 10. Aortic atherosclerosis and multi-vessel coronary artery calcifications. 11. Remaining incidental findings as described above. Electronically Signed: By: Brett Fairy M.D. On: 06/01/2022 20:24   CT Head Wo Contrast  Result Date: 06/01/2022 CLINICAL DATA:  Mental  status change EXAM: CT HEAD WITHOUT CONTRAST TECHNIQUE: Contiguous axial images were obtained from the base of the skull through the vertex without intravenous contrast. RADIATION DOSE REDUCTION: This exam was performed according to the departmental dose-optimization program which includes automated exposure control, adjustment of the mA and/or kV according to patient size and/or use of iterative reconstruction technique. COMPARISON:  Head CT 03/24/2022 FINDINGS: Brain: No evidence of acute infarction, hemorrhage, hydrocephalus, extra-axial collection or mass lesion/mass effect. There is stable moderate periventricular and deep white matter hypodensity, likely chronic small vessel ischemic change. There is an old lacunar infarct in the right basal ganglia. Vascular: Atherosclerotic calcifications are present within the cavernous internal carotid arteries. Skull: Normal. Negative for fracture or focal lesion. Sinuses/Orbits: No acute finding. Other: None. IMPRESSION: 1. No acute intracranial process. 2. Stable moderate chronic small vessel ischemic change. 3. 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 Electronically Signed   By: Ronney Asters M.D.   On: 06/01/2022 20:13   DG Chest 1 View  Result Date: 06/01/2022 CLINICAL DATA:  Chest pain EXAM: CHEST  1 VIEW COMPARISON:  X-Paolucci 03/24/2022 FINDINGS: Enlarged cardiopericardial silhouette. No consolidation, pneumothorax or effusion. No edema. Calcified aorta. IMPRESSION: Enlarged cardiopericardial silhouette. Electronically  Signed   By: Jill Side M.D.   On: 06/01/2022 16:25    Cardiac Studies   Echo pending  Patient Profile     Mr. Wimpey is a 42M with chronic diastolic HF, hypertension, severe aortic stenosis, CVA, PAD, CKD II-III admitted with sepsis from a urinary source and shortness of breath.    Assessment & Plan    # Acute on chronic diastolic HF:  # Severe aortic stenosis:  # Acute on chronic renal failure:  Mr. Kasperski presented with shortness of breath and dysuria.  He has been out of his medications recently due to challenging social conditions.  Also with N/V and poor oral intake.  He has LE edema and evidence of volume overload on exam.  He notes that his swelling is less than his baseline.  However, he also has acute on chronic renal failure and hypotension in the setting of sepsis from a likely urinary source.  Continue to hold on diuresis for now.  .  Will order TED hose.  He is undergoing evaluation for TAVR but has been delayed 2/2 recurrent infections.  Hold lasix, Farxiga, spironolactone and olmesartan.   # Resistant HTN:  BP has been low.  Home antihypertensives have been held 2/2 hypotension and AKI. Will resume carvedilol and hydralazine at reduced dose as his BP is starting to rise.   # Persistent atrial fibrillation:  Continue Eliquis.    # PAD: # CVA:  # HL: Continue aspirin and statin.   # OSA:  Patient acutely short of breath overnight.  He has known OSA and has struggled to get CPAP.  Will need to address in outpatient setting.      For questions or updates, please contact [place] Please consult [email or web address] for contact info under        Signed, Skeet Latch, MD  06/03/2022, 8:46 AM

## 2022-06-03 NOTE — Progress Notes (Addendum)
Patient reports sudden onset of shortness of breath. No signs of hypoxia at this time. Current Sp02 is 95% on room air. Pt has not had any urine out overnight at this time. 3L nasal cannula applied for comfort with no improvement in symptoms.  MD notified. Chest Xray and 20 mg of lasix ordered.
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
## 2022-06-03 NOTE — Progress Notes (Signed)
Echocardiogram 2D Echocardiogram has been performed.  Ronny Flurry 06/03/2022, 10:28 AM

## 2022-06-04 DIAGNOSIS — I5023 Acute on chronic systolic (congestive) heart failure: Secondary | ICD-10-CM

## 2022-06-04 DIAGNOSIS — N3080 Other cystitis without hematuria: Secondary | ICD-10-CM | POA: Diagnosis not present

## 2022-06-04 DIAGNOSIS — R570 Cardiogenic shock: Secondary | ICD-10-CM | POA: Diagnosis not present

## 2022-06-04 LAB — COMPREHENSIVE METABOLIC PANEL
ALT: 15 U/L (ref 0–44)
ALT: 21 U/L (ref 0–44)
AST: 19 U/L (ref 15–41)
AST: 21 U/L (ref 15–41)
Albumin: 3.1 g/dL — ABNORMAL LOW (ref 3.5–5.0)
Albumin: 2.8 g/dL — ABNORMAL LOW (ref 3.5–5.0)
Alkaline Phosphatase: 104 U/L (ref 38–126)
Alkaline Phosphatase: 123 U/L (ref 38–126)
Anion gap: 11 (ref 5–15)
Anion gap: 12 (ref 5–15)
BUN: 51 mg/dL — ABNORMAL HIGH (ref 8–23)
BUN: 43 mg/dL — ABNORMAL HIGH (ref 8–23)
CO2: 24 mmol/L (ref 22–32)
CO2: 27 mmol/L (ref 22–32)
Calcium: 8.8 mg/dL — ABNORMAL LOW (ref 8.9–10.3)
Calcium: 8.6 mg/dL — ABNORMAL LOW (ref 8.9–10.3)
Chloride: 103 mmol/L (ref 98–111)
Chloride: 100 mmol/L (ref 98–111)
Creatinine, Ser: 2.79 mg/dL — ABNORMAL HIGH (ref 0.61–1.24)
Creatinine, Ser: 2.31 mg/dL — ABNORMAL HIGH (ref 0.61–1.24)
GFR, Estimated: 25 mL/min — ABNORMAL LOW (ref >60)
GFR, Estimated: 31 mL/min — ABNORMAL LOW (ref >60)
Glucose, Bld: 90 mg/dL (ref 70–99)
Glucose, Bld: 92 mg/dL (ref 70–99)
Potassium: 3.4 mmol/L — ABNORMAL LOW (ref 3.5–5.1)
Potassium: 3.7 mmol/L (ref 3.5–5.1)
Sodium: 138 mmol/L (ref 135–145)
Sodium: 139 mmol/L (ref 135–145)
Total Bilirubin: 1.2 mg/dL (ref 0.3–1.2)
Total Bilirubin: 1 mg/dL (ref 0.3–1.2)
Total Protein: 6 g/dL — ABNORMAL LOW (ref 6.5–8.1)
Total Protein: 5.9 g/dL — ABNORMAL LOW (ref 6.5–8.1)

## 2022-06-04 LAB — CBC WITH DIFFERENTIAL/PLATELET
Abs Immature Granulocytes: 0.01 10*3/uL (ref 0.00–0.07)
Basophils Absolute: 0 10*3/uL (ref 0.0–0.1)
Basophils Relative: 1 %
Eosinophils Absolute: 0.1 10*3/uL (ref 0.0–0.5)
Eosinophils Relative: 2 %
HCT: 30.9 % — ABNORMAL LOW (ref 39.0–52.0)
Hemoglobin: 10.3 g/dL — ABNORMAL LOW (ref 13.0–17.0)
Immature Granulocytes: 0 %
Lymphocytes Relative: 40 %
Lymphs Abs: 1.4 10*3/uL (ref 0.7–4.0)
MCH: 24.9 pg — ABNORMAL LOW (ref 26.0–34.0)
MCHC: 33.3 g/dL (ref 30.0–36.0)
MCV: 74.6 fL — ABNORMAL LOW (ref 80.0–100.0)
Monocytes Absolute: 0.3 10*3/uL (ref 0.1–1.0)
Monocytes Relative: 10 %
Neutro Abs: 1.6 10*3/uL — ABNORMAL LOW (ref 1.7–7.7)
Neutrophils Relative %: 47 %
Platelets: 126 10*3/uL — ABNORMAL LOW (ref 150–400)
RBC: 4.14 MIL/uL — ABNORMAL LOW (ref 4.22–5.81)
RDW: 23.3 % — ABNORMAL HIGH (ref 11.5–15.5)
WBC: 3.5 10*3/uL — ABNORMAL LOW (ref 4.0–10.5)
nRBC: 1.4 % — ABNORMAL HIGH (ref 0.0–0.2)

## 2022-06-04 LAB — RETICULOCYTES
Immature Retic Fract: 34.3 % — ABNORMAL HIGH (ref 2.3–15.9)
RBC.: 4.14 MIL/uL — ABNORMAL LOW (ref 4.22–5.81)
Retic Count, Absolute: 94.8 10*3/uL (ref 19.0–186.0)
Retic Ct Pct: 2.3 % (ref 0.4–3.1)

## 2022-06-04 LAB — PHOSPHORUS: Phosphorus: 2.6 mg/dL (ref 2.5–4.6)

## 2022-06-04 LAB — IRON AND TIBC
Iron: 35 ug/dL — ABNORMAL LOW (ref 45–182)
Saturation Ratios: 17 % — ABNORMAL LOW (ref 17.9–39.5)
TIBC: 209 ug/dL — ABNORMAL LOW (ref 250–450)
UIBC: 174 ug/dL

## 2022-06-04 LAB — MAGNESIUM
Magnesium: 1.5 mg/dL — ABNORMAL LOW (ref 1.7–2.4)
Magnesium: 1.8 mg/dL (ref 1.7–2.4)

## 2022-06-04 LAB — VITAMIN B12: Vitamin B-12: 1138 pg/mL — ABNORMAL HIGH (ref 180–914)

## 2022-06-04 LAB — COOXEMETRY PANEL
Carboxyhemoglobin: 2.5 % — ABNORMAL HIGH (ref 0.5–1.5)
Methemoglobin: 0.8 % (ref 0.0–1.5)
O2 Saturation: 79.5 %
Total hemoglobin: 10.8 g/dL — ABNORMAL LOW (ref 12.0–16.0)

## 2022-06-04 LAB — FERRITIN: Ferritin: 199 ng/mL (ref 24–336)

## 2022-06-04 LAB — FOLATE: Folate: 6.7 ng/mL (ref >5.9)

## 2022-06-04 MED ORDER — POTASSIUM CHLORIDE 20 MEQ PO PACK
20.0000 meq | PACK | Freq: Once | ORAL | Status: AC
  Administered 2022-06-04: 20 meq via ORAL
  Filled 2022-06-04: qty 1

## 2022-06-04 MED ORDER — POTASSIUM CHLORIDE 20 MEQ PO PACK: 60.0000 meq | PACK | Freq: Once | ORAL | Status: DC

## 2022-06-04 MED ORDER — POTASSIUM CHLORIDE 20 MEQ PO PACK
40.0000 meq | PACK | Freq: Once | ORAL | Status: AC
  Administered 2022-06-04: 40 meq via ORAL
  Filled 2022-06-04: qty 2

## 2022-06-04 MED ORDER — MAGNESIUM SULFATE 2 GM/50ML IV SOLN
2.0000 g | Freq: Once | INTRAVENOUS | Status: AC
  Administered 2022-06-04: 2 g via INTRAVENOUS
  Filled 2022-06-04: qty 50

## 2022-06-04 MED ORDER — ONDANSETRON HCL 4 MG/2ML IJ SOLN
4.0000 mg | Freq: Four times a day (QID) | INTRAMUSCULAR | Status: DC | PRN
  Administered 2022-06-04 – 2022-06-10 (×2): 4 mg via INTRAVENOUS
  Filled 2022-06-04: qty 2

## 2022-06-04 MED ORDER — HYDRALAZINE HCL 25 MG PO TABS
37.5000 mg | ORAL_TABLET | Freq: Three times a day (TID) | ORAL | Status: DC
  Administered 2022-06-04 – 2022-06-06 (×6): 37.5 mg via ORAL
  Filled 2022-06-04 (×6): qty 2

## 2022-06-04 MED ORDER — POTASSIUM CHLORIDE 20 MEQ PO PACK
40.0000 meq | PACK | Freq: Every day | ORAL | Status: DC
  Administered 2022-06-04 – 2022-06-06 (×3): 40 meq via ORAL
  Filled 2022-06-04 (×3): qty 2

## 2022-06-04 MED ORDER — ALUM & MAG HYDROXIDE-SIMETH 200-200-20 MG/5ML PO SUSP
30.0000 mL | Freq: Once | ORAL | Status: AC
  Administered 2022-06-04: 30 mL via ORAL
  Filled 2022-06-04: qty 30

## 2022-06-04 NOTE — Progress Notes (Signed)
   06/04/22 0708  Provider Notification  Provider Name/Title Dr Broadus John  Date Provider Notified 06/04/22  Time Provider Notified [phone]  Method of Notification  (secure Chat)  Notification Reason Other (Comment) (Nausea and vomiting)  Provider response At bedside  Date of Provider Response 06/04/22  Time of Provider Response 0730
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
## 2022-06-04 NOTE — Progress Notes (Addendum)
Advanced Heart Failure Rounding Note   Subjective:    Taken to cath lab yesterday for RHC. Found to be in cardiogenic shock. [place] 06/03/22: RA 17 PA 72/41 (50) PCWP 30 (v=45) FICK CI 2.1 Thermo 1.6. Started on milrinone and IV lasix  Diuresing well. Out almost 9L!  Scr improved. Breathing much better. C/o ab pain.   CVP 11 Co-ox 79%  BP still high    Objective:   Weight Range:  Vital Signs:   Temp:  [97.9 F (36.6 C)-98.6 F (37 C)] 98.2 F (36.8 C) (03/02 0708) Pulse Rate:  [92-118] 106 (03/02 0708) Resp:  [16-20] 20 (03/02 0708) BP: (129-162)/(84-107) 144/104 (03/02 0708) SpO2:  [94 %-96 %] 96 % (03/02 0708) Weight:  [80.8 kg-85.4 kg] 80.8 kg (03/02 0500) Last BM Date : 06/03/22  Weight change: Filed Weights   06/02/22 0307 06/03/22 1100 06/04/22 0500  Weight: 85 kg 85.4 kg 80.8 kg    Intake/Output:   Intake/Output Summary (Last 24 hours) at 06/04/2022 1018 Last data filed at 06/04/2022 0936 Gross per 24 hour  Intake 1310.03 ml  Output 9800 ml  Net -8489.97 ml     Physical Exam: General:  Weak appearing. No resp difficulty HEENT: normal Neck: supple. JVP 11. Carotids 2+ bilat; no bruits. No lymphadenopathy or thryomegaly appreciated. Cor: PMI nondisplaced. Irregular rate & rhythm. No rubs, gallops or murmurs. Lungs: clear Abdomen: soft, nontender, nondistended. No hepatosplenomegaly. No bruits or masses. Good bowel sounds. Extremities: no cyanosis, clubbing, rash, edema Neuro: alert & orientedx3, cranial nerves grossly intact. moves all 4 extremities w/o difficulty. Affect pleasant  Telemetry: AF 100-110 Personally reviewed   Labs: Basic Metabolic Panel: Recent Labs  Lab 05/31/22 1648 05/31/22 1648 06/01/22 1627 06/01/22 1824 06/01/22 1829 06/02/22 0554 06/03/22 0020 06/03/22 1551 06/04/22 0347  NA 138   < > 134*  --  134* 135 134* 140  141 138  K 4.9  --  5.0  --  6.2* 6.1* 4.7 4.0  3.8 3.4*  CL 107*  --  107  --   --  107 107  --  103   CO2 14*  --  17*  --   --  17* 16*  --  24  GLUCOSE 105*  --  120*  --   --  115* 138*  --  90  BUN 47*  --  48*  --   --  50* 54*  --  51*  CREATININE 2.32*  --  2.67*  --   --  2.88* 3.49*  --  2.79*  CALCIUM 8.9  --  9.0  --   --  9.1 8.9  --  8.8*  MG  --   --   --  1.6*  --  1.8  --   --  1.5*  PHOS  --   --   --  4.5  --   --   --   --   --    < > = values in this interval not displayed.    Liver Function Tests: Recent Labs  Lab 06/01/22 1627 06/02/22 0554 06/04/22 0347  AST '22 25 19  '$ ALT '28 24 15  '$ ALKPHOS 152* 135* 104  BILITOT 1.6* 1.2 1.2  PROT 5.7* 5.6* 6.0*  ALBUMIN 2.4* 2.3* 3.1*   Recent Labs  Lab 06/01/22 1627  LIPASE 53*   Recent Labs  Lab 06/01/22 1824  AMMONIA 16    CBC: Recent Labs  Lab 06/01/22 1629 06/01/22  1829 06/02/22 0554 06/03/22 0020 06/03/22 1551 06/04/22 0347  WBC 3.9*  --  3.2* 4.8  --  3.5*  NEUTROABS  --   --  1.8 2.3  --  1.6*  HGB 11.4* 14.3 12.4* 11.7* 13.3  13.3 10.3*  HCT 35.9* 42.0 36.3* 34.6* 39.0  39.0 30.9*  MCV 76.5*  --  75.6* 74.7*  --  74.6*  PLT 114*  --  113* 134*  --  126*    Cardiac Enzymes: No results for input(s): "CKTOTAL", "CKMB", "CKMBINDEX", "TROPONINI" in the last 168 hours.  BNP: BNP (last 3 results) Recent Labs    05/31/22 1648 06/01/22 1629 06/02/22 0554  BNP 917.1* 670.6* 525.7*    ProBNP (last 3 results) No results for input(s): "PROBNP" in the last 8760 hours.    Other results:  Imaging: CARDIAC CATHETERIZATION  Result Date: 06/03/2022 Findings: RA = 17 RV = 65/17 PA = 72/41 (50) PCW = 30 (v=45) Fick cardiac output/index = 4.3/2.1 Thermo CO/CI 3.3/1.6 PVR = 3.2 (Fick) 4.2 (Thermo) Ao sat = 95% PA sat = 56%, 56% Assessment: 1. Markedly elevated filling pressures with low cardiac output 2. Prominent v- waves in PCWP tracing c/w severe MR 3. Mixed pulmonary HTN Plan/Discussion: Start milrinone and lasix gtt. Glori Bickers, MD 6:07 PM  ECHOCARDIOGRAM COMPLETE  Result Date:  06/03/2022    ECHOCARDIOGRAM REPORT   Patient Name:   [name] Date of Exam: 06/03/2022 Medical Rec #:  [id number]  Height:       69.0 in Accession #:    [id number] Weight:       187.4 lb Date of Birth:  27-Jul-1960  BSA:          2.010 m Patient Age:    62 years   BP:           154/131 mmHg Patient Gender: M          HR:           98 bpm. Exam Location:  Inpatient Procedure: 2D Echo, Cardiac Doppler and Color Doppler Indications:    CHF-Acute Diastolic [id number]  History:        Patient has prior history of Echocardiogram examinations, most                 recent 02/19/2022. CHF, CAD, PAD, Arrythmias:Atrial                 Fibrillation, Signs/Symptoms:Syncope and Chest Pain; Risk                 Factors:Hypertension, Sleep Apnea and Dyslipidemia. CKD, stage                 3.  Sonographer:    Ronny Flurry Referring Phys: [id number] VASUNDHRA RATHORE IMPRESSIONS  1. Left ventricular ejection fraction, by estimation, is 45 to 50%. The left ventricle has mildly decreased function. Left ventricular endocardial border not optimally defined to evaluate regional wall motion. There is severe concentric left ventricular  hypertrophy. Left ventricular diastolic function could not be evaluated.  2. Right ventricular systolic function was not well visualized. The right ventricular size is normal. There is mildly elevated pulmonary artery systolic pressure. The estimated right ventricular systolic pressure is Q000111Q mmHg.  3. Left atrial size was severely dilated.  4. Right atrial size was severely dilated.  5. The mitral valve is degenerative. is severe and central in nature mitral valve regurgitation. No evidence of mitral stenosis.  6. Tricuspid valve regurgitation is mild to  moderate.  7. The aortic valve is calcified. There is severe calcifcation of the aortic valve. Aortic valve regurgitation is mild. Severe low flow low gradient aortic valve stenosis with mean gradient 32 mm Hg and DVI 0.22.  8. Aortic dilatation noted. There  is mild dilatation of the ascending aorta, measuring 40 mm.  9. The inferior vena cava is normal in size with <50% respiratory variability, suggesting right atrial pressure of 8 mmHg. Comparison(s): Prior images reviewed side by side. Mitral regurgitation has increased. FINDINGS  Left Ventricle: Left ventricular ejection fraction, by estimation, is 45 to 50%. The left ventricle has mildly decreased function. Left ventricular endocardial border not optimally defined to evaluate regional wall motion. The left ventricular internal cavity size was normal in size. There is severe concentric left ventricular hypertrophy. Left ventricular diastolic function could not be evaluated due to mitral regurgitation (moderate or greater). Left ventricular diastolic function could not be evaluated. Right Ventricle: The right ventricular size is normal. Right vetricular wall thickness was not well visualized. Right ventricular systolic function was not well visualized. There is mildly elevated pulmonary artery systolic pressure. The tricuspid regurgitant velocity is 2.66 m/s, and with an assumed right atrial pressure of 8 mmHg, the estimated right ventricular systolic pressure is Q000111Q mmHg. Left Atrium: Left atrial size was severely dilated. Right Atrium: Right atrial size was severely dilated. Pericardium: There is no evidence of pericardial effusion. Mitral Valve: The mitral valve is degenerative in appearance. Is severe and central in nature mitral valve regurgitation. No evidence of mitral valve stenosis. Tricuspid Valve: The tricuspid valve is grossly normal. Tricuspid valve regurgitation is mild to moderate. No evidence of tricuspid stenosis. Aortic Valve: The aortic valve is calcified. There is severe calcifcation of the aortic valve. There is mild aortic valve annular calcification. Aortic valve regurgitation is mild. Aortic regurgitation PHT measures 746 msec. Severe aortic stenosis is present. Aortic valve mean gradient  measures 32.0 mmHg. Aortic valve peak gradient measures 52.4 mmHg. Aortic valve area, by VTI measures 0.82 cm. Pulmonic Valve: The pulmonic valve was normal in structure. Pulmonic valve regurgitation is trivial. Aorta: Aortic dilatation noted. There is mild dilatation of the ascending aorta, measuring 40 mm. Venous: The inferior vena cava is normal in size with less than 50% respiratory variability, suggesting right atrial pressure of 8 mmHg. IAS/Shunts: No atrial level shunt detected by color flow Doppler.  LEFT VENTRICLE PLAX 2D LVIDd:         4.50 cm   Diastology LVIDs:         3.20 cm   LV e' medial:    9.36 cm/s LV PW:         1.80 cm   LV E/e' medial:  14.4 LV IVS:        1.30 cm   LV e' lateral:   8.92 cm/s LVOT diam:     2.20 cm   LV E/e' lateral: 15.1 LV SV:         53 LV SV Index:   27 LVOT Area:     3.80 cm  RIGHT VENTRICLE            IVC RV S prime:     5.66 cm/s  IVC diam: 2.50 cm TAPSE (M-mode): 1.6 cm LEFT ATRIUM              Index        RIGHT ATRIUM           Index LA diam:  5.40 cm  2.69 cm/m   RA Area:     29.35 cm LA Vol (A2C):   135.0 ml 67.18 ml/m  RA Volume:   97.20 ml  48.37 ml/m LA Vol (A4C):   92.5 ml  46.03 ml/m LA Biplane Vol: 112.0 ml 55.73 ml/m  AORTIC VALVE AV Area (Vmax):    0.95 cm AV Area (Vmean):   0.90 cm AV Area (VTI):     0.82 cm AV Vmax:           362.00 cm/s AV Vmean:          249.000 cm/s AV VTI:            0.652 m AV Peak Grad:      52.4 mmHg AV Mean Grad:      32.0 mmHg LVOT Vmax:         90.80 cm/s LVOT Vmean:        58.967 cm/s LVOT VTI:          0.140 m LVOT/AV VTI ratio: 0.22 AI PHT:            746 msec  AORTA Ao Root diam: 3.30 cm Ao Asc diam:  4.00 cm MR Peak grad:    119.7 mmHg   TRICUSPID VALVE MR Mean grad:    77.0 mmHg    TR Peak grad:   28.3 mmHg MR Vmax:         547.00 cm/s  TR Vmax:        266.00 cm/s MR Vmean:        412.0 cm/s MR PISA:         9.05 cm     SHUNTS MR PISA Eff ROA: 51 mm       Systemic VTI:  0.14 m MR PISA Radius:  1.20 cm       Systemic Diam: 2.20 cm MV E velocity: 135.00 cm/s Rudean Haskell MD Electronically signed by Rudean Haskell MD Signature Date/Time: 06/03/2022/11:50:37 AM    Final    DG Chest 1 View  Result Date: 06/03/2022 CLINICAL DATA:  Shortness of breath EXAM: CHEST  1 VIEW COMPARISON:  Radiograph and CT 06/01/2022 FINDINGS: Stable cardiomegaly. Aortic atherosclerotic calcification. No focal consolidation, pleural effusion, or pneumothorax. No acute osseous abnormality. IMPRESSION: Stable cardiomegaly. No acute cardiopulmonary process. Electronically Signed   By: Placido Sou M.D.   On: 06/03/2022 02:14     Medications:     Scheduled Medications:  apixaban  5 mg Oral BID   aspirin EC  81 mg Oral Daily   atorvastatin  20 mg Oral Daily   Chlorhexidine Gluconate Cloth  6 each Topical Daily   furosemide  80 mg Intravenous Once   hydrALAZINE  25 mg Oral Q8H   sodium bicarbonate  650 mg Oral BID   sodium chloride flush  3 mL Intravenous Q12H    Infusions:  cefTRIAXone (ROCEPHIN)  IV 1 g (06/04/22 0844)   furosemide (LASIX) 200 mg in dextrose 5 % 100 mL (2 mg/mL) infusion 15 mg/hr (06/04/22 0600)   milrinone 0.25 mcg/kg/min (06/04/22 0600)    PRN Medications: acetaminophen, ondansetron (ZOFRAN) IV   Assessment/Plan:   1. A/c systolic HF -> cardiogenic shock - Echo 3/1/24LV EF 40-45% severe AS severe MR - Echo 11/23 EF 55% severe AS mild MR - Admitted with marked volume overload. Diuresis complicated worsening renal function. CO2 16.  - Suspect shock due to HF and not sepsis - RHC 06/03/22: RA 17 PA 72/41 (50)  PCWP 30 (v=45) FICK CI 2.1 Thermo 1.6. Started on milrinone and IV lasix - Co-ox 79%. CVP 11 - Continue milrinone. Continue IV diuresis but cut lasix gtt back to 10 - Increase hydralazine carefully - Will need to consider TAVR this admit once UTI cleared. D/w Structural team on Monday   2. AS - Severe AS  - Pending TAVR (had been on hold due to recent septic arthritis)  -  As above, need to consider TAVR this admit   3. MR  - Mod-Severe  - suspect exacerbated by volume overload and HTN - Can re-evaluate after optimization.  - Likely also to improve with TAVR   4. UTI -UA large amount of leukocytes.  -Off farxiga would not rechallenge.  -  On rocephin per primary   5. AKI on CKD 3b due to ATN/shock - Baseline Scr 1.4-1.5  - Peak Scr 3.5 -> 2.8 with inotropic support - Continue inotropic support   6. Chronic A fib  - Rate controlled  - On eliquis 5 mg twice a day. Continue for now   7. H/O Septic Knee 01/2022  - Coag negative staph. Completed daptomycin x 4 weeks then switched to linezolid 02/20/22  8. Hypokalemia/hypomagnesemia - supp    CRITICAL CARE Performed by: Glori Bickers  Total critical care time: 35 minutes  Critical care time was exclusive of separately billable procedures and treating other patients.  Critical care was necessary to treat or prevent imminent or life-threatening deterioration.  Critical care was time spent personally by me (independent of midlevel providers or residents) on the following activities: development of treatment plan with patient and/or surrogate as well as nursing, discussions with consultants, evaluation of patient's response to treatment, examination of patient, obtaining history from patient or surrogate, ordering and performing treatments and interventions, ordering and review of laboratory studies, ordering and review of radiographic studies, pulse oximetry and re-evaluation of patient's condition.   Length of Stay: 2   Glori Bickers MD 06/04/2022, 10:18 AM  Advanced Heart Failure Team Pager [phone] (M-F; [place])  Please contact [facility] for night-coverage after hours (4p -7a ) and weekends on [email or web address]

## 2022-06-04 NOTE — Progress Notes (Addendum)
NA

## 2022-06-04 NOTE — Progress Notes (Signed)
Noted  runs of V-tach 0940 am =19 beats and 1156 am =5 beats.  MD made aware and ordered to turn milrinone down to 0.125

## 2022-06-04 NOTE — Progress Notes (Signed)
Having frequent PVCs. K 3.7, Mg 1.8, repletion ordered. CVP noted to be 2, re-zeroed and still reading 2. Will stop lasix drip at this time, reassess in AM.  Buford Dresser, MD, PhD, [facility] at [facility] at [facility] [address], [place], [place] [address] [phone]

## 2022-06-05 DIAGNOSIS — G9341 Metabolic encephalopathy: Secondary | ICD-10-CM | POA: Diagnosis not present

## 2022-06-05 DIAGNOSIS — A4150 Gram-negative sepsis, unspecified: Secondary | ICD-10-CM | POA: Diagnosis not present

## 2022-06-05 DIAGNOSIS — R570 Cardiogenic shock: Secondary | ICD-10-CM | POA: Diagnosis not present

## 2022-06-05 DIAGNOSIS — I5023 Acute on chronic systolic (congestive) heart failure: Secondary | ICD-10-CM | POA: Diagnosis not present

## 2022-06-05 DIAGNOSIS — N3080 Other cystitis without hematuria: Secondary | ICD-10-CM | POA: Diagnosis not present

## 2022-06-05 DIAGNOSIS — Z006 Encounter for examination for normal comparison and control in clinical research program: Secondary | ICD-10-CM | POA: Diagnosis not present

## 2022-06-05 DIAGNOSIS — Z1152 Encounter for screening for COVID-19: Secondary | ICD-10-CM | POA: Diagnosis not present

## 2022-06-05 LAB — CBC WITH DIFFERENTIAL/PLATELET
Abs Immature Granulocytes: 0.01 10*3/uL (ref 0.00–0.07)
Basophils Absolute: 0 10*3/uL (ref 0.0–0.1)
Basophils Relative: 1 %
Eosinophils Absolute: 0.1 10*3/uL (ref 0.0–0.5)
Eosinophils Relative: 3 %
HCT: 34.6 % — ABNORMAL LOW (ref 39.0–52.0)
Hemoglobin: 11.5 g/dL — ABNORMAL LOW (ref 13.0–17.0)
Immature Granulocytes: 0 %
Lymphocytes Relative: 33 %
Lymphs Abs: 1.3 10*3/uL (ref 0.7–4.0)
MCH: 25 pg — ABNORMAL LOW (ref 26.0–34.0)
MCHC: 33.2 g/dL (ref 30.0–36.0)
MCV: 75.2 fL — ABNORMAL LOW (ref 80.0–100.0)
Monocytes Absolute: 0.5 10*3/uL (ref 0.1–1.0)
Monocytes Relative: 14 %
Neutro Abs: 2 10*3/uL (ref 1.7–7.7)
Neutrophils Relative %: 49 %
Platelets: 143 10*3/uL — ABNORMAL LOW (ref 150–400)
RBC: 4.6 MIL/uL (ref 4.22–5.81)
RDW: 23.2 % — ABNORMAL HIGH (ref 11.5–15.5)
WBC: 4 10*3/uL (ref 4.0–10.5)
nRBC: 1.3 % — ABNORMAL HIGH (ref 0.0–0.2)

## 2022-06-05 LAB — URINE CULTURE: Culture: >=100000 — AB

## 2022-06-05 LAB — BASIC METABOLIC PANEL
Anion gap: 12 (ref 5–15)
BUN: 39 mg/dL — ABNORMAL HIGH (ref 8–23)
CO2: 25 mmol/L (ref 22–32)
Calcium: 8.4 mg/dL — ABNORMAL LOW (ref 8.9–10.3)
Chloride: 100 mmol/L (ref 98–111)
Creatinine, Ser: 2.11 mg/dL — ABNORMAL HIGH (ref 0.61–1.24)
GFR, Estimated: 35 mL/min — ABNORMAL LOW (ref >60)
Glucose, Bld: 86 mg/dL (ref 70–99)
Potassium: 4.2 mmol/L (ref 3.5–5.1)
Sodium: 137 mmol/L (ref 135–145)

## 2022-06-05 LAB — COOXEMETRY PANEL
Carboxyhemoglobin: 4.1 % — ABNORMAL HIGH (ref 0.5–1.5)
Methemoglobin: 0.9 % (ref 0.0–1.5)
O2 Saturation: 66.8 %
Total hemoglobin: 11.7 g/dL — ABNORMAL LOW (ref 12.0–16.0)

## 2022-06-05 LAB — MAGNESIUM: Magnesium: 2.1 mg/dL (ref 1.7–2.4)

## 2022-06-05 MED ORDER — AMIODARONE HCL IN DEXTROSE 360-4.14 MG/200ML-% IV SOLN
30.0000 mg/h | INTRAVENOUS | Status: DC
  Administered 2022-06-05 – 2022-06-10 (×11): 30 mg/h via INTRAVENOUS
  Filled 2022-06-05 (×9): qty 200

## 2022-06-05 MED ORDER — MILRINONE LACTATE IN DEXTROSE 20-5 MG/100ML-% IV SOLN
0.1250 ug/kg/min | INTRAVENOUS | Status: DC
  Administered 2022-06-05: 0.125 ug/kg/min via INTRAVENOUS
  Filled 2022-06-05: qty 100

## 2022-06-05 MED ORDER — AMIODARONE HCL IN DEXTROSE 360-4.14 MG/200ML-% IV SOLN
INTRAVENOUS | Status: AC
  Filled 2022-06-05: qty 200

## 2022-06-05 MED ORDER — AMIODARONE HCL IN DEXTROSE 360-4.14 MG/200ML-% IV SOLN
60.0000 mg/h | INTRAVENOUS | Status: DC
  Administered 2022-06-05 (×2): 60 mg/h via INTRAVENOUS
  Filled 2022-06-05: qty 200

## 2022-06-05 MED ORDER — AMIODARONE LOAD VIA INFUSION
150.0000 mg | Freq: Once | INTRAVENOUS | Status: AC
  Administered 2022-06-05: 150 mg via INTRAVENOUS

## 2022-06-05 MED ORDER — FUROSEMIDE 10 MG/ML IJ SOLN
80.0000 mg | Freq: Once | INTRAMUSCULAR | Status: AC
  Administered 2022-06-05: 80 mg via INTRAVENOUS
  Filled 2022-06-05: qty 8

## 2022-06-05 MED ORDER — LACTATED RINGERS IV BOLUS
250.0000 mL | Freq: Once | INTRAVENOUS | Status: AC
  Administered 2022-06-05: 250 mL via INTRAVENOUS

## 2022-06-05 NOTE — Progress Notes (Addendum)
Advanced Heart Failure Rounding Note   Subjective:    Taken to cath lab 3/1 for RHC. Found to be in cardiogenic shock. [place] 06/03/22: RA 17 PA 72/41 (50) PCWP 30 (v=45) FICK CI 2.1 Thermo 1.6. Started on milrinone and IV lasix  Was having frequent PVCs/NSVT overnight and AF with RVR up into 140s (AF is chronic but rate up after stopping b-blocker).  Milrinone now off. IV amio started   Feels ok. Co-ox 67% this am on milrinone 0.125 (prior to milrinone being stopped).   Scr continues to improve 3.5 ->-> 2.1   Objective:   Weight Range:  Vital Signs:   Temp:  [97.5 F (36.4 C)-98.5 F (36.9 C)] 97.8 F (36.6 C) (03/03 1114) Pulse Rate:  [92-148] 109 (03/03 1114) Resp:  [16-24] 19 (03/03 1114) BP: (125-157)/(101-127) 137/104 (03/03 1114) SpO2:  [90 %-98 %] 98 % (03/03 1114) Weight:  [79.5 kg] 79.5 kg (03/03 0500) Last BM Date : 06/04/22  Weight change: Filed Weights   06/03/22 1100 06/04/22 0500 06/05/22 0500  Weight: 85.4 kg 80.8 kg 79.5 kg    Intake/Output:   Intake/Output Summary (Last 24 hours) at 06/05/2022 1140 Last data filed at 06/05/2022 1011 Gross per 24 hour  Intake 775.76 ml  Output 5025 ml  Net -4249.24 ml      Physical Exam: General:  Weak appearing. No resp difficulty HEENT: normal Neck: supple. JVP 11. Carotids 2+ bilat; no bruits. No lymphadenopathy or thryomegaly appreciated. Cor: PMI nondisplaced. Irregular rate & rhythm. No rubs, gallops or murmurs. Lungs: clear Abdomen: soft, nontender, nondistended. No hepatosplenomegaly. No bruits or masses. Good bowel sounds. Extremities: no cyanosis, clubbing, rash, edema Neuro: alert & orientedx3, cranial nerves grossly intact. moves all 4 extremities w/o difficulty. Affect pleasant  Telemetry: AF 100-145 frequent PVCs multiple runs nsvt. Personally reviewed    Labs: Basic Metabolic Panel: Recent Labs  Lab 06/01/22 1824 06/01/22 1829 06/02/22 0554 06/03/22 0020 06/03/22 1551 06/04/22 0347  06/04/22 1930 06/05/22 0500  NA  --    < > 135 134* 140  141 138 139 137  K  --    < > 6.1* 4.7 4.0  3.8 3.4* 3.7 4.2  CL  --   --  107 107  --  103 100 100  CO2  --   --  17* 16*  --  '24 27 25  '$ GLUCOSE  --   --  115* 138*  --  90 92 86  BUN  --   --  50* 54*  --  51* 43* 39*  CREATININE  --   --  2.88* 3.49*  --  2.79* 2.31* 2.11*  CALCIUM  --   --  9.1 8.9  --  8.8* 8.6* 8.4*  MG 1.6*  --  1.8  --   --  1.5* 1.8 2.1  PHOS 4.5  --   --   --   --   --  2.6  --    < > = values in this interval not displayed.     Liver Function Tests: Recent Labs  Lab 06/01/22 1627 06/02/22 0554 06/04/22 0347 06/04/22 1930  AST '22 25 19 21  '$ ALT '28 24 15 21  '$ ALKPHOS 152* 135* 104 123  BILITOT 1.6* 1.2 1.2 1.0  PROT 5.7* 5.6* 6.0* 5.9*  ALBUMIN 2.4* 2.3* 3.1* 2.8*    Recent Labs  Lab 06/01/22 1627  LIPASE 53*    Recent Labs  Lab 06/01/22 1824  AMMONIA  16     CBC: Recent Labs  Lab 06/01/22 1629 06/01/22 1829 06/02/22 0554 06/03/22 0020 06/03/22 1551 06/04/22 0347 06/05/22 0500  WBC 3.9*  --  3.2* 4.8  --  3.5* 4.0  NEUTROABS  --   --  1.8 2.3  --  1.6* 2.0  HGB 11.4*   < > 12.4* 11.7* 13.3  13.3 10.3* 11.5*  HCT 35.9*   < > 36.3* 34.6* 39.0  39.0 30.9* 34.6*  MCV 76.5*  --  75.6* 74.7*  --  74.6* 75.2*  PLT 114*  --  113* 134*  --  126* 143*   < > = values in this interval not displayed.     Cardiac Enzymes: No results for input(s): "CKTOTAL", "CKMB", "CKMBINDEX", "TROPONINI" in the last 168 hours.  BNP: BNP (last 3 results) Recent Labs    05/31/22 1648 06/01/22 1629 06/02/22 0554  BNP 917.1* 670.6* 525.7*     ProBNP (last 3 results) No results for input(s): "PROBNP" in the last 8760 hours.    Other results:  Imaging: CARDIAC CATHETERIZATION  Result Date: 06/03/2022 Findings: RA = 17 RV = 65/17 PA = 72/41 (50) PCW = 30 (v=45) Fick cardiac output/index = 4.3/2.1 Thermo CO/CI 3.3/1.6 PVR = 3.2 (Fick) 4.2 (Thermo) Ao sat = 95% PA sat = 56%, 56%  Assessment: 1. Markedly elevated filling pressures with low cardiac output 2. Prominent v- waves in PCWP tracing c/w severe MR 3. Mixed pulmonary HTN Plan/Discussion: Start milrinone and lasix gtt. Glori Bickers, MD 6:07 PM    Medications:     Scheduled Medications:  apixaban  5 mg Oral BID   aspirin EC  81 mg Oral Daily   atorvastatin  20 mg Oral Daily   Chlorhexidine Gluconate Cloth  6 each Topical Daily   hydrALAZINE  37.5 mg Oral Q8H   potassium chloride  40 mEq Oral Daily   sodium chloride flush  3 mL Intravenous Q12H    Infusions:  amiodarone 60 mg/hr (06/05/22 1023)   Followed by   amiodarone     cefTRIAXone (ROCEPHIN)  IV 1 g (06/05/22 1001)    PRN Medications: acetaminophen, ondansetron (ZOFRAN) IV   Assessment/Plan:   1. A/c systolic HF -> cardiogenic shock - Echo 3/1/24LV EF 40-45% severe AS severe MR - Echo 11/23 EF 55% severe AS mild MR - Cath 3/23: LAD apical 90% D2 90% LCx 50%d RCA 40%p (Medical rx) - Admitted with marked volume overload. Diuresis complicated worsening renal function. CO2 16.  - Suspect shock due to HF and not sepsis - [facility] 06/03/22: RA 17 PA 72/41 (50) PCWP 30 (v=45) FICK CI 2.1 Thermo 1.6. Started on milrinone and IV lasix - Co-ox 67%. On milrinone 0.125 -> subsequently d/c'd due to AF with RVR/NSVT - Now on amio. Will restart milrinone at 0.125 and follow.  - Continue hydralazine as BP was markedly elevated - Will need to consider TAVR this admit once UTI cleared. D/w Structural team on Monday   2. AS - Severe AS  - Has seen [name] as outpatient for TAVR (had been on hold due to recent septic arthritis)  - As above, need to consider TAVR this admit   3. MR  - Mod-Severe  - suspect exacerbated by volume overload and HTN - Can re-evaluate after optimization.  - Likely also to improve with TAVR  4. PVCs/NSVT - start IV amio - Keep K> 4.0. Mg > 2.0  5. Chronic A fib. Now with RVR   -  Rate elevated after b-blocker stopped and  milrinone started - Will start IV amio - On eliquis 5 mg twice a day. Continue for now  6. CAD  - primarily non-obstructive with distal/branch vessel disease - no angina - continue medical management  7. AKI on CKD 3b due to ATN/shock - Baseline Scr 1.4-1.5  - Peak Scr 3.5 -> 2.8 -> 2.1 with inotropic support - Continue inotropic support   8. H/O Septic Knee 01/2022  - Coag negative staph. Completed daptomycin x 4 weeks then switched to linezolid 02/20/22  9. Hypokalemia/hypomagnesemia - supp    10. UTI -UA large amount of leukocytes.  -Off farxiga would not rechallenge.  -  On rocephin per primary  Length of Stay: 3   Glori Bickers MD 06/05/2022, 11:40 AM  Advanced Heart Failure Team Pager [phone] (M-F; [place])  Please contact [facility] for night-coverage after hours (4p -7a ) and weekends on [email or web address]
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
## 2022-06-05 NOTE — Progress Notes (Signed)
   06/05/22 0800  Vitals  Temp 98.3 F (36.8 C)  Temp Source Oral  BP (!) 139/106  MAP (mmHg) 118  BP Location Left Arm  BP Method Automatic  Patient Position (if appropriate) Lying  Pulse Rate (!) 109  Pulse Rate Source Monitor  ECG Heart Rate (!) 128  Resp (!) 21  Level of Consciousness  Level of Consciousness Alert  MEWS COLOR  MEWS Score Color Yellow  Oxygen Therapy  SpO2 95 %  Invasive Hemodynamic Monitoring  CVP (mmHg) 3 mmHg  MEWS Score  MEWS Temp 0  MEWS Systolic 0  MEWS Pulse 2  MEWS RR 1  MEWS LOC 0  MEWS Score 3  Provider Notification  Provider Name/Title Dr Haroldine Laws  Date Provider Notified 06/05/22  Time Provider Notified 0815  Method of Notification  (secure chat)  Notification Reason Other (Comment) (afib with RVR CVP 4)  Provider response See new orders  Date of Provider Response 06/05/22  Time of Provider Response 0815   EKG completed afib with RVR CVP 4 MD made aware. Ordered to stop milrinone and bolus LR 238m

## 2022-06-06 ENCOUNTER — Encounter (HOSPITAL_COMMUNITY): Payer: Self-pay | Admitting: Internal Medicine

## 2022-06-06 ENCOUNTER — Telehealth (HOSPITAL_COMMUNITY): Payer: Self-pay | Admitting: Licensed Clinical Social Worker

## 2022-06-06 DIAGNOSIS — I5032 Chronic diastolic (congestive) heart failure: Secondary | ICD-10-CM

## 2022-06-06 DIAGNOSIS — N3080 Other cystitis without hematuria: Secondary | ICD-10-CM | POA: Diagnosis not present

## 2022-06-06 LAB — COOXEMETRY PANEL
Carboxyhemoglobin: 3.4 % — ABNORMAL HIGH (ref 0.5–1.5)
Carboxyhemoglobin: 2.7 % — ABNORMAL HIGH (ref 0.5–1.5)
Methemoglobin: 1.3 % (ref 0.0–1.5)
Methemoglobin: <0.7 % (ref 0.0–1.5)
O2 Saturation: 90.2 %
O2 Saturation: 79.2 %
Total hemoglobin: 11.1 g/dL — ABNORMAL LOW (ref 12.0–16.0)
Total hemoglobin: 11 g/dL — ABNORMAL LOW (ref 12.0–16.0)

## 2022-06-06 LAB — BASIC METABOLIC PANEL
Anion gap: 7 (ref 5–15)
BUN: 38 mg/dL — ABNORMAL HIGH (ref 8–23)
CO2: 31 mmol/L (ref 22–32)
Calcium: 8.6 mg/dL — ABNORMAL LOW (ref 8.9–10.3)
Chloride: 97 mmol/L — ABNORMAL LOW (ref 98–111)
Creatinine, Ser: 1.96 mg/dL — ABNORMAL HIGH (ref 0.61–1.24)
GFR, Estimated: 38 mL/min — ABNORMAL LOW (ref >60)
Glucose, Bld: 97 mg/dL (ref 70–99)
Potassium: 4.2 mmol/L (ref 3.5–5.1)
Sodium: 135 mmol/L (ref 135–145)

## 2022-06-06 LAB — MAGNESIUM: Magnesium: 1.8 mg/dL (ref 1.7–2.4)

## 2022-06-06 MED ORDER — CEPHALEXIN 250 MG PO CAPS
250.0000 mg | ORAL_CAPSULE | Freq: Four times a day (QID) | ORAL | Status: DC
  Administered 2022-06-06 – 2022-06-08 (×9): 250 mg via ORAL
  Filled 2022-06-06 (×10): qty 1

## 2022-06-06 MED ORDER — MAGNESIUM SULFATE 2 GM/50ML IV SOLN
2.0000 g | Freq: Once | INTRAVENOUS | Status: AC
  Administered 2022-06-06: 2 g via INTRAVENOUS
  Filled 2022-06-06: qty 50

## 2022-06-06 MED ORDER — HYDRALAZINE HCL 20 MG/ML IJ SOLN: 10.0000 mg | Freq: Four times a day (QID) | INTRAMUSCULAR | Status: DC | PRN

## 2022-06-06 MED ORDER — HYDRALAZINE HCL 50 MG PO TABS
50.0000 mg | ORAL_TABLET | Freq: Three times a day (TID) | ORAL | Status: DC
  Administered 2022-06-06 – 2022-06-10 (×10): 50 mg via ORAL
  Filled 2022-06-06 (×10): qty 1

## 2022-06-06 NOTE — TOC Initial Note (Addendum)
Transition of Care [facility]) - Initial/Assessment Note    Patient Details  Name: [name] MRN: [id number] Date of Birth: March 19, 1961  Transition of [facility]) CM/SW Contact:    Erenest Rasher, RN Phone Number: [phone] 06/06/2022, 3:20 PM  Clinical Narrative:                 CM spoke to pt at bedside. States he and family are currently living in hotel. States he paid for week and will have another week covered. States he is getting his disability. He contacted [facility] [place] [id number]. States he will need to pay a [id number] rental application fee. They will assist with finding a rental property.  Message sent to HF CSW for possible assistance with rent. Pt states he is on the waiting list at [employer].   Has transportation. Currently getting food stamps. Pt reports he has scale at home to do daily weights.   Expected Discharge Plan: Home/Self Care Barriers to Discharge: Continued Medical Work up   Patient Goals and CMS Choice Patient states their goals for this hospitalization and ongoing recovery are:: wants to be able to care for his family          Expected Discharge Plan and Services In-house Referral: Clinical Social Work Discharge Planning Services: CM Consult   Living arrangements for the past 2 months: Hotel/Motel                                      Prior Living Arrangements/Services Living arrangements for the past 2 months: Hotel/Motel Lives with:: Spouse, Adult Children Patient language and need for interpreter reviewed:: Yes Do you feel safe going back to the place where you live?: Yes      Need for Family Participation in Patient Care: No (Comment) Care giver support system in place?: Yes (comment) Current home services: DME (scale) Criminal Activity/Legal Involvement Pertinent to Current Situation/Hospitalization: No - Comment as needed  Activities of Daily Living Home Assistive Devices/Equipment: None ADL  Screening (condition at time of admission) Patient's cognitive ability adequate to safely complete daily activities?: Yes Is the patient deaf or have difficulty hearing?: No Does the patient have difficulty seeing, even when wearing glasses/contacts?: No Does the patient have difficulty concentrating, remembering, or making decisions?: No Patient able to express need for assistance with ADLs?: Yes Does the patient have difficulty dressing or bathing?: No Independently performs ADLs?: Yes (appropriate for developmental age) Does the patient have difficulty walking or climbing stairs?: Yes Weakness of Legs: Both Weakness of Arms/Hands: None  Permission Sought/Granted Permission sought to share information with : Case Manager, Family Supports Permission granted to share information with : Yes, Verbal Permission Granted  Share Information with NAME: Aquinna Spinks     Permission granted to share info w Relationship: wife  Permission granted to share info w Contact Information: [phone]  Emotional Assessment Appearance:: Appears stated age Attitude/Demeanor/Rapport: Engaged Affect (typically observed): Accepting Orientation: : Oriented to Self, Oriented to Place, Oriented to  Time, Oriented to Situation   Psych Involvement: No (comment)  Admission diagnosis:  Acute urinary tract infection [N39.0] Patient Active Problem List   Diagnosis Date Noted   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/02/2022   Pneumonia 06/02/2022   Chest pain 0000000   Acute metabolic encephalopathy 0000000   Anemia 06/02/2022   Thrombocytopenia ([place]) 06/02/2022   Hyponatremia 06/02/2022   Pulmonary nodules 06/02/2022  Syncope 03/24/2022   (HFpEF) heart failure with preserved ejection fraction ([place]) 03/24/2022   Hypomagnesemia 03/24/2022   Atrial fibrillation with RVR ([place]) 02/18/2022   CAD (coronary artery disease) 02/18/2022   SIRS (systemic inflammatory response syndrome) ([place]) 02/18/2022   Dizziness  02/18/2022   Urinary tract infection 02/18/2022   Abnormal chest x-[place] 02/18/2022   Nausea and vomiting 02/18/2022   History of CVA (cerebrovascular accident) 02/18/2022   History of septic arthritis 02/18/2022   Severe sepsis ([place]) 01/15/2022   Septic arthritis of knee, right (HCC)    Obesity (BMI 30-39.9) 01/03/2022   Iron deficiency anemia 01/03/2022   Gout of right knee 01/03/2022   Sepsis due to group A Streptococcus with acute renal failure without septic shock ([place])    Acute renal failure superimposed on chronic kidney disease ([place]) 01/01/2022   Magnesium deficiency 11/23/2021   PAD (peripheral artery disease) ([place]) 09/20/2021   Open wound of lower extremity, right, initial encounter 09/20/2021   Superior mesenteric artery stenosis ([place]) 06/23/2021   Severe aortic stenosis    Persistent atrial fibrillation ([place]) 05/05/2021   Secondary hypercoagulable state ([place]) 05/05/2021   Chronic back pain 11/05/2020   History of chronic skin ulcer 11/05/2020   OA (osteoarthritis) 11/05/2020   OSA (obstructive sleep apnea) 11/05/2020   Chronic kidney disease, stage 3a ([place]) 02/19/2020   Acute on chronic heart failure with preserved ejection fraction ([place]) 07/25/2017   Pain and swelling of right knee    Hypertensive urgency 08/30/2013   HTN (hypertension) 10/19/2011   Gout 10/19/2011   Hyperlipemia 10/19/2011   GERD (gastroesophageal reflux disease) 08/27/2011   PCP:  [name], MD Pharmacy:   [facility] [place], [address] [place] [place] [address] Phone: [phone] Fax: [phone]  [facility] [address] [place] [address] Phone: [phone] Fax: [phone]     Social Determinants of Health (SDOH) Social History: SDOH Screenings   Food Insecurity: No Food Insecurity (06/02/2022)  Housing: High Risk (06/02/2022)  Transportation  Needs: No Transportation Needs (06/02/2022)  Utilities: Not At Risk (06/02/2022)  Alcohol Screen: Low Risk  (09/30/2021)  Depression (PHQ2-9): Medium Risk (05/31/2022)  Financial Resource Strain: High Risk (04/07/2020)  Physical Activity: Insufficiently Active (09/30/2021)  Social Connections: Moderately Integrated (09/30/2021)  Stress: No Stress Concern Present (09/30/2021)  Tobacco Use: Low Risk  (06/06/2022)   SDOH Interventions: Housing Interventions: Other (Comment) (no permanent housing, ;living in a hotel with wife and 3 kids.) Transportation Interventions: Intervention Not Indicated   Readmission Risk Interventions    01/19/2022    3:56 PM  Readmission Risk Prevention Plan  Transportation Screening Complete  Home Care Screening Complete  Medication Review (RN CM) Complete

## 2022-06-06 NOTE — Progress Notes (Addendum)
Advanced Heart Failure Rounding Note   Subjective:   Taken to cath lab 3/1 for RHC. Found to be in cardiogenic shock. [place] 06/03/22: RA 17 PA 72/41 (50) PCWP 30 (v=45) FICK CI 2.1 Thermo 1.6. Started on milrinone and IV lasix  Patient still having short runs of NSVT. Remains on low dose milrinone and amio gtt  Diuresed well with IV lasix, now d/c'd. Overall down 23lbs   Scr continues to improve 3.5 -> 2.1-> 1.9  Feels fine, co-ox this am 90%? (Will redraw). CVP 5. Denies CP/SOB. Wants to get up and walk around today.  Objective:   Weight Range:  Vital Signs:   Temp:  [97.8 F (36.6 C)-98.6 F (37 C)] 98.4 F (36.9 C) (03/04 0414) Pulse Rate:  [94-109] 94 (03/03 1605) Resp:  [16-21] 17 (03/04 0414) BP: (127-153)/(100-129) 147/121 (03/04 0414) SpO2:  [95 %-98 %] 98 % (03/04 0414) Weight:  [75.1 kg] 75.1 kg (03/04 0604) Last BM Date : 06/05/22  Weight change: Filed Weights   06/04/22 0500 06/05/22 0500 06/06/22 0604  Weight: 80.8 kg 79.5 kg 75.1 kg    Intake/Output:   Intake/Output Summary (Last 24 hours) at 06/06/2022 0740 Last data filed at 06/06/2022 0604 Gross per 24 hour  Intake 1428.43 ml  Output 2300 ml  Net -871.57 ml     Physical Exam: CVP 4/5 General:  well appearing.  No respiratory difficulty HEENT: normal Neck: supple. No JVD. R IJ CVC Carotids 2+ bilat; no bruits. No lymphadenopathy or thyromegaly appreciated. Cor: PMI nondisplaced. Regular rate & irregular rhythm. No rubs and gallops. + murmurs. Lungs: clear Abdomen: soft, nontender, nondistended. No hepatosplenomegaly. No bruits or masses. Good bowel sounds. Extremities: no cyanosis, clubbing, rash, edema. + ted hose Neuro: alert & oriented x 3, cranial nerves grossly intact. moves all 4 extremities w/o difficulty. Affect pleasant.   Telemetry: AF low 100s, short burst NSVT longest 14 beats (Personally reviewed)    Labs: Basic Metabolic Panel: Recent Labs  Lab 06/01/22 1824 06/01/22 1829  06/02/22 0554 06/03/22 0020 06/03/22 1551 06/04/22 0347 06/04/22 1930 06/05/22 0500 06/06/22 0415  NA  --    < > 135 134* 140  141 138 139 137 135  K  --    < > 6.1* 4.7 4.0  3.8 3.4* 3.7 4.2 4.2  CL  --   --  107 107  --  103 100 100 97*  CO2  --   --  17* 16*  --  '24 27 25 31  '$ GLUCOSE  --   --  115* 138*  --  90 92 86 97  BUN  --   --  50* 54*  --  51* 43* 39* 38*  CREATININE  --   --  2.88* 3.49*  --  2.79* 2.31* 2.11* 1.96*  CALCIUM  --   --  9.1 8.9  --  8.8* 8.6* 8.4* 8.6*  MG 1.6*  --  1.8  --   --  1.5* 1.8 2.1 1.8  PHOS 4.5  --   --   --   --   --  2.6  --   --    < > = values in this interval not displayed.    Liver Function Tests: Recent Labs  Lab 06/01/22 1627 06/02/22 0554 06/04/22 0347 06/04/22 1930  AST '22 25 19 21  '$ ALT '28 24 15 21  '$ ALKPHOS 152* 135* 104 123  BILITOT 1.6* 1.2 1.2 1.0  PROT 5.7* 5.6* 6.0*  5.9*  ALBUMIN 2.4* 2.3* 3.1* 2.8*   Recent Labs  Lab 06/01/22 1627  LIPASE 53*   Recent Labs  Lab 06/01/22 1824  AMMONIA 16   CBC: Recent Labs  Lab 06/01/22 1629 06/01/22 1829 06/02/22 0554 06/03/22 0020 06/03/22 1551 06/04/22 0347 06/05/22 0500  WBC 3.9*  --  3.2* 4.8  --  3.5* 4.0  NEUTROABS  --   --  1.8 2.3  --  1.6* 2.0  HGB 11.4*   < > 12.4* 11.7* 13.3  13.3 10.3* 11.5*  HCT 35.9*   < > 36.3* 34.6* 39.0  39.0 30.9* 34.6*  MCV 76.5*  --  75.6* 74.7*  --  74.6* 75.2*  PLT 114*  --  113* 134*  --  126* 143*   < > = values in this interval not displayed.   Cardiac Enzymes: No results for input(s): "CKTOTAL", "CKMB", "CKMBINDEX", "TROPONINI" in the last 168 hours.  BNP: BNP (last 3 results) Recent Labs    05/31/22 1648 06/01/22 1629 06/02/22 0554  BNP 917.1* 670.6* 525.7*   ProBNP (last 3 results) No results for input(s): "PROBNP" in the last 8760 hours.  Other results:  Imaging: No results found.  Medications:   Scheduled Medications:  apixaban  5 mg Oral BID   aspirin EC  81 mg Oral Daily   atorvastatin  20  mg Oral Daily   Chlorhexidine Gluconate Cloth  6 each Topical Daily   hydrALAZINE  37.5 mg Oral Q8H   potassium chloride  40 mEq Oral Daily   sodium chloride flush  3 mL Intravenous Q12H   Infusions:  amiodarone 30 mg/hr (06/05/22 2358)   cefTRIAXone (ROCEPHIN)  IV 1 g (06/05/22 1001)   milrinone 0.125 mcg/kg/min (06/05/22 1936)   PRN Medications: acetaminophen, ondansetron (ZOFRAN) IV  Assessment/Plan:  1. A/c systolic HF -> cardiogenic shock - Echo 3/1/24LV EF 40-45% severe AS severe MR - Echo 11/23 EF 55% severe AS mild MR - Cath 3/23: LAD apical 90% D2 90% LCx 50%d RCA 40%p (Medical rx) - Admitted with marked volume overload. Diuresis complicated worsening renal function. CO2 16.  - Suspect shock due to HF and not sepsis - RHC 06/03/22: RA 17 PA 72/41 (50) PCWP 30 (v=45) FICK CI 2.1 Thermo 1.6. Started on milrinone and IV lasix - Co-ox 90%?Marland Kitchen On milrinone 0.125 -> subsequently d/c'd due to AF with RVR/NSVT - Now on amio and back on milrinone at 0.125, having short intermittent burst of NSVT. Will stop milrinone gtt - Increase hydralazine 375>50 TID as BP was markedly elevated - Will need to consider TAVR this admit once UTI cleared. Plan to d/w Structural team today   2. AS - Severe AS  - Has seen [name] as outpatient for TAVR (had been on hold due to recent septic arthritis)  - As above, need to consider TAVR this admit   3. MR  - Mod-Severe  - suspect exacerbated by volume overload and HTN - Can re-evaluate after optimization.  - Likely also to improve with TAVR  4. PVCs/NSVT - start IV amio - Keep K> 4.0. Mg > 2.0  5. Chronic A fib. Now with RVR   - Rate elevated after b-blocker stopped and milrinone started - Continue IV amio - On eliquis 5 mg twice a day. Continue for now  6. CAD  - primarily non-obstructive with distal/branch vessel disease - no angina - continue medical management  7. AKI on CKD 3b due to ATN/shock - Baseline Scr 1.4-1.5  -  Peak Scr  3.5 -> 2.8 -> 2.1-> 1.96 with inotropic support - Continue inotropic support   8. H/O Septic Knee 01/2022  - Coag negative staph. Completed daptomycin x 4 weeks then switched to linezolid 02/20/22  9. Hypokalemia/hypomagnesemia - supp    10. UTI - UA large amount of leukocytes.  - Off farxiga would not rechallenge.  - On rocephin per primary  Length of Stay: [place] MD 06/06/2022, 7:40 AM  Advanced Heart Failure Team Pager [phone] (M-F; 7a - 4p)  Please contact [facility] for night-coverage after hours (4p -7a ) and weekends on [email or web address]

## 2022-06-06 NOTE — Consult Note (Signed)
   [facility] CM Inpatient Consult   06/06/2022  [name] [birth year] [id number]   Orientation with Natividad Brood, [facility] Liaison for review.   Location: [facility]).   [place] [facility]) [place] Patient: Insurance [facility])    Primary Care Provider: [name], MD with  [facility].   Per chart review and inpatient TOC RN: Patient is active with the HF clinic with limited support system. Caregivers from spouse and mother-in-law. [facility] RN liaison will continue to follow discharge plans and update [facility] RN care manager accordingly with noted changes.  [place] does not replace or interfere with any arrangements made by the Inpatient Transition of Care team.   For questions contact:   Raina Mina, RN, [place] Hours M-F 8:00 am to 4:30 pm [phone] [Office toll free line][facility] Office Hours are M-F 8:30 - 5 pm 24 hour nurse advise line [phone] Conceirge  [email or web address]

## 2022-06-06 NOTE — Care Management Important Message (Signed)
Important Message  Patient Details  Name: [name] MRN: [id number] Date of Birth: March 06, 1961   Medicare Important Message Given:  Yes     Orbie Pyo 06/06/2022, 2:40 PM
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
## 2022-06-06 NOTE — Progress Notes (Signed)
Mobility Specialist Progress Note:   06/06/22 1309  Mobility  Activity Refused mobility   Pt sleepy and falling asleep talking to me. Asking to walk at 3 pm. Will follow-up as time allows.   Gareth Eagle Ed Mandich Mobility Specialist Please contact via [employer] or  Rehab Office at [phone]

## 2022-06-06 NOTE — Progress Notes (Signed)
Mobility Specialist Progress Note:   06/06/22 1528  Mobility  Activity Ambulated with assistance in room;Transferred to/from [facility]  Level of Assistance Standby assist, set-up cues, supervision of patient - no hands on  Assistive Device None  Distance Ambulated (ft) 6 ft  Activity Response Tolerated well  $Mobility charge 1 Mobility   Pt asked to use bathroom. No complaints of pain. Declined walk d/t needing to "take care of business". Left pt on [facility] with call bell in reach and all needs met.  Gareth Eagle Aissa Lisowski Mobility Specialist Please contact via [employer] or  Rehab Office at [phone]
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
## 2022-06-07 DIAGNOSIS — N3080 Other cystitis without hematuria: Secondary | ICD-10-CM | POA: Diagnosis not present

## 2022-06-07 LAB — BASIC METABOLIC PANEL
Anion gap: 9 (ref 5–15)
BUN: 35 mg/dL — ABNORMAL HIGH (ref 8–23)
CO2: 24 mmol/L (ref 22–32)
Calcium: 8.7 mg/dL — ABNORMAL LOW (ref 8.9–10.3)
Chloride: 102 mmol/L (ref 98–111)
Creatinine, Ser: 1.45 mg/dL — ABNORMAL HIGH (ref 0.61–1.24)
GFR, Estimated: 54 mL/min — ABNORMAL LOW (ref >60)
Glucose, Bld: 102 mg/dL — ABNORMAL HIGH (ref 70–99)
Potassium: 4.3 mmol/L (ref 3.5–5.1)
Sodium: 135 mmol/L (ref 135–145)

## 2022-06-07 LAB — CULTURE, BLOOD (ROUTINE X 2): Culture: NO GROWTH

## 2022-06-07 LAB — COOXEMETRY PANEL
Carboxyhemoglobin: 2.6 % — ABNORMAL HIGH (ref 0.5–1.5)
Methemoglobin: <0.7 % (ref 0.0–1.5)
O2 Saturation: 77.1 %
Total hemoglobin: 11.5 g/dL — ABNORMAL LOW (ref 12.0–16.0)

## 2022-06-07 LAB — MAGNESIUM: Magnesium: 1.9 mg/dL (ref 1.7–2.4)

## 2022-06-07 MED ORDER — LOSARTAN POTASSIUM 25 MG PO TABS
12.5000 mg | ORAL_TABLET | Freq: Every day | ORAL | Status: DC
  Administered 2022-06-07: 12.5 mg via ORAL
  Filled 2022-06-07: qty 1

## 2022-06-07 MED ORDER — FUROSEMIDE 40 MG PO TABS
40.0000 mg | ORAL_TABLET | Freq: Every day | ORAL | Status: DC
  Administered 2022-06-07 – 2022-06-10 (×3): 40 mg via ORAL
  Filled 2022-06-07 (×4): qty 1

## 2022-06-07 MED ORDER — FUROSEMIDE 10 MG/ML IJ SOLN
40.0000 mg | Freq: Once | INTRAMUSCULAR | Status: AC
  Administered 2022-06-07: 40 mg via INTRAVENOUS
  Filled 2022-06-07: qty 4

## 2022-06-07 MED ORDER — MAGNESIUM SULFATE 2 GM/50ML IV SOLN
2.0000 g | Freq: Once | INTRAVENOUS | Status: AC
  Administered 2022-06-07: 2 g via INTRAVENOUS
  Filled 2022-06-07: qty 50

## 2022-06-07 NOTE — Progress Notes (Signed)
Mobility Specialist Progress Note:   06/07/22 1510  Mobility  Activity Ambulated with assistance in hallway  Level of Assistance Contact guard assist, steadying assist  Assistive Device Other (Comment) (IV Pole)  Distance Ambulated (ft) 100 ft  Activity Response Tolerated well  Mobility Referral Yes  $Mobility charge 1 Mobility   Pt agreeable to mobility session. Required minG assist for safety. C/o bilat foot pain upon ambulation, and minor dizziness which subsided with time. Pt left sitting EOB with all needs met, encouraged frequent OOB mobility.   Nelta Numbers Mobility Specialist Please contact via [employer] or  Rehab office at [phone]
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
## 2022-06-07 NOTE — Consult Note (Addendum)
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 permanent loss of functional independence or other long term morbidity.  I will discuss his case further with cardiac surgery as part of a multidisciplinary evaluation.  As long as we are all in agreement, we will plan to proceed with TAVR later this week while he is hospitalized.  The patient provides full informed consent for the procedure as described and all questions were answered.   Sherren Mocha, M.D. 06/07/2022 8:07 PM

## 2022-06-07 NOTE — Progress Notes (Addendum)
Advanced Heart Failure Rounding Note   Subjective:   Taken to cath lab 3/1 for RHC. Found to be in cardiogenic shock. [place] 06/03/22: RA 17 PA 72/41 (50) PCWP 30 (v=45) FICK CI 2.1 Thermo 1.6. Started on milrinone and IV lasix  Patient still having short runs of NSVT. Remains on amio gtt.   Co-ox stable, now off milrinone (3/4)  Diuresed well with IV lasix, now d/c'd. Overall down 23lbs   Scr continues to improve 3.5 -> 2.1-> 1.9-> 1.45  Feels good this morning. Able to ambulate with no complaints. Denies CP/SOB  Objective:   Weight Range:  Vital Signs:   Temp:  [98 F (36.7 C)-98.5 F (36.9 C)] 98.5 F (36.9 C) (03/05 0518) Pulse Rate:  [90-119] 101 (03/04 2323) Resp:  [15-21] 20 (03/04 2323) BP: (134-158)/(99-126) 158/125 (03/05 0518) SpO2:  [96 %-99 %] 97 % (03/04 2323) Weight:  [75.7 kg] 75.7 kg (03/05 0518) Last BM Date : 06/06/22  Weight change: Filed Weights   06/05/22 0500 06/06/22 0604 06/07/22 0518  Weight: 79.5 kg 75.1 kg 75.7 kg    Intake/Output:   Intake/Output Summary (Last 24 hours) at 06/07/2022 0725 Last data filed at 06/07/2022 0519 Gross per 24 hour  Intake 498.23 ml  Output 1425 ml  Net -926.77 ml     Physical Exam: CVP 9 General:  well appearing.  No respiratory difficulty HEENT: normal Neck: supple. JVD ~9 cm. Carotids 2+ bilat; no bruits. No lymphadenopathy or thyromegaly appreciated. Cor: PMI nondisplaced. Regular rate & rhythm. No rubs, gallops or murmurs. Lungs: clear Abdomen: soft, nontender, nondistended. No hepatosplenomegaly. No bruits or masses. Good bowel sounds. Extremities: no cyanosis, clubbing, rash, edema. + Ted hose  Neuro: alert & oriented x 3, cranial nerves grossly intact. moves all 4 extremities w/o difficulty. Affect pleasant.    Telemetry: AF low 100s, short burst NSVT  (Personally reviewed)    Labs: Basic Metabolic Panel: Recent Labs  Lab 06/01/22 1824 06/01/22 1829 06/04/22 0347 06/04/22 1930 06/05/22 0500  06/06/22 0415 06/07/22 0124  NA  --    < > 138 139 137 135 135  K  --    < > 3.4* 3.7 4.2 4.2 4.3  CL  --    < > 103 100 100 97* 102  CO2  --    < > '24 27 25 31 24  '$ GLUCOSE  --    < > 90 92 86 97 102*  BUN  --    < > 51* 43* 39* 38* 35*  CREATININE  --    < > 2.79* 2.31* 2.11* 1.96* 1.45*  CALCIUM  --    < > 8.8* 8.6* 8.4* 8.6* 8.7*  MG 1.6*   < > 1.5* 1.8 2.1 1.8 1.9  PHOS 4.5  --   --  2.6  --   --   --    < > = values in this interval not displayed.    Liver Function Tests: Recent Labs  Lab 06/01/22 1627 06/02/22 0554 06/04/22 0347 06/04/22 1930  AST '22 25 19 21  '$ ALT '28 24 15 21  '$ ALKPHOS 152* 135* 104 123  BILITOT 1.6* 1.2 1.2 1.0  PROT 5.7* 5.6* 6.0* 5.9*  ALBUMIN 2.4* 2.3* 3.1* 2.8*   Recent Labs  Lab 06/01/22 1627  LIPASE 53*   Recent Labs  Lab 06/01/22 1824  AMMONIA 16   CBC: Recent Labs  Lab 06/01/22 1629 06/01/22 1829 06/02/22 0554 06/03/22 0020 06/03/22 1551 06/04/22 0347  06/05/22 0500  WBC 3.9*  --  3.2* 4.8  --  3.5* 4.0  NEUTROABS  --   --  1.8 2.3  --  1.6* 2.0  HGB 11.4*   < > 12.4* 11.7* 13.3  13.3 10.3* 11.5*  HCT 35.9*   < > 36.3* 34.6* 39.0  39.0 30.9* 34.6*  MCV 76.5*  --  75.6* 74.7*  --  74.6* 75.2*  PLT 114*  --  113* 134*  --  126* 143*   < > = values in this interval not displayed.   Cardiac Enzymes: No results for input(s): "CKTOTAL", "CKMB", "CKMBINDEX", "TROPONINI" in the last 168 hours.  BNP: BNP (last 3 results) Recent Labs    05/31/22 1648 06/01/22 1629 06/02/22 0554  BNP 917.1* 670.6* 525.7*   ProBNP (last 3 results) No results for input(s): "PROBNP" in the last 8760 hours.  Other results:  Imaging: No results found.  Medications:   Scheduled Medications:  apixaban  5 mg Oral BID   aspirin EC  81 mg Oral Daily   atorvastatin  20 mg Oral Daily   cephALEXin  250 mg Oral QID   Chlorhexidine Gluconate Cloth  6 each Topical Daily   hydrALAZINE  50 mg Oral Q8H   sodium chloride flush  3 mL Intravenous  Q12H   Infusions:  amiodarone 30 mg/hr (06/07/22 0108)   PRN Medications: acetaminophen, hydrALAZINE, ondansetron (ZOFRAN) IV  Assessment/Plan:  1. A/c systolic HF -> cardiogenic shock - Echo 3/1/24LV EF 40-45% severe AS severe MR - Echo 11/23 EF 55% severe AS mild MR - Cath 3/23: LAD apical 90% D2 90% LCx 50%d RCA 40%p (Medical rx) - Admitted with marked volume overload. Diuresis complicated worsening renal function. CO2 16.  - Suspect shock due to HF and not sepsis - RHC 06/03/22: RA 17 PA 72/41 (50) PCWP 30 (v=45) FICK CI 2.1 Thermo 1.6. Started on milrinone and IV lasix - Co-ox 77%, stable. Now off milrinone 3/4 - Continue hydralazine 50 TID  - Will add losartan 12.'5mg'$  daily as BP markedly elevated - Start lasix '40mg'$  daily after 40 IV this morning - Will need to consider TAVR this admit, discussed with structural team yesterday. Plan to see him today.    2. AS - Severe AS  - Has seen [name] as outpatient for TAVR (had been on hold due to recent septic arthritis)  - As above, need to consider TAVR this admit   3. MR  - Mod-Severe  - suspect exacerbated by volume overload and HTN - Can re-evaluate after optimization.  - Likely also to improve with TAVR  4. PVCs/NSVT - start IV amio - Keep K> 4.0. Mg > 2.0  5. Chronic A fib. Now with RVR   - Rate elevated after b-blocker stopped and milrinone started - Continue IV amio - On eliquis 5 mg twice a day. Continue for now - Can consider DCCV once TAVR complete.   6. CAD  - primarily non-obstructive with distal/branch vessel disease - no angina - continue medical management  7. AKI on CKD 3b due to ATN/shock - Baseline Scr 1.4-1.5  - Peak Scr 3.5 -> 2.8 -> 2.1-> 1.96->1.45 with inotropic support - Continue inotropic support   8. H/O Septic Knee 01/2022  - Coag negative staph. Completed daptomycin x 4 weeks then switched to linezolid 02/20/22  9. Hypokalemia/hypomagnesemia - supp    10. UTI - UA large amount of  leukocytes.  - Off farxiga would not rechallenge.  -  On rocephin per primary  Length of Stay: [place] AGACNP-BC  06/07/2022, 7:25 AM  Advanced Heart Failure Team Pager [phone] (M-F; 7a - 4p)  Please contact [facility] for night-coverage after hours (4p -7a ) and weekends on [email or web address]

## 2022-06-08 ENCOUNTER — Inpatient Hospital Stay (HOSPITAL_COMMUNITY): Payer: 59

## 2022-06-08 ENCOUNTER — Ambulatory Visit: Payer: 59 | Admitting: Cardiology

## 2022-06-08 DIAGNOSIS — N3080 Other cystitis without hematuria: Secondary | ICD-10-CM | POA: Diagnosis not present

## 2022-06-08 DIAGNOSIS — I2583 Coronary atherosclerosis due to lipid rich plaque: Secondary | ICD-10-CM

## 2022-06-08 DIAGNOSIS — A419 Sepsis, unspecified organism: Secondary | ICD-10-CM | POA: Diagnosis present

## 2022-06-08 DIAGNOSIS — I350 Nonrheumatic aortic (valve) stenosis: Secondary | ICD-10-CM

## 2022-06-08 DIAGNOSIS — R570 Cardiogenic shock: Secondary | ICD-10-CM

## 2022-06-08 DIAGNOSIS — I4891 Unspecified atrial fibrillation: Secondary | ICD-10-CM | POA: Diagnosis present

## 2022-06-08 DIAGNOSIS — I2510 Atherosclerotic heart disease of native coronary artery without angina pectoris: Secondary | ICD-10-CM

## 2022-06-08 DIAGNOSIS — N390 Urinary tract infection, site not specified: Secondary | ICD-10-CM

## 2022-06-08 LAB — BASIC METABOLIC PANEL
Anion gap: 12 (ref 5–15)
BUN: 28 mg/dL — ABNORMAL HIGH (ref 8–23)
CO2: 22 mmol/L (ref 22–32)
Calcium: 8.8 mg/dL — ABNORMAL LOW (ref 8.9–10.3)
Chloride: 104 mmol/L (ref 98–111)
Creatinine, Ser: 1.39 mg/dL — ABNORMAL HIGH (ref 0.61–1.24)
GFR, Estimated: 57 mL/min — ABNORMAL LOW (ref >60)
Glucose, Bld: 92 mg/dL (ref 70–99)
Potassium: 4 mmol/L (ref 3.5–5.1)
Sodium: 138 mmol/L (ref 135–145)

## 2022-06-08 LAB — APTT: aPTT: 52 seconds — ABNORMAL HIGH (ref 24–36)

## 2022-06-08 LAB — TYPE AND SCREEN
ABO/RH(D): A POS
Antibody Screen: NEGATIVE

## 2022-06-08 LAB — COOXEMETRY PANEL
Carboxyhemoglobin: 2.6 % — ABNORMAL HIGH (ref 0.5–1.5)
Methemoglobin: <0.7 % (ref 0.0–1.5)
O2 Saturation: 66.4 %
Total hemoglobin: 11.9 g/dL — ABNORMAL LOW (ref 12.0–16.0)

## 2022-06-08 LAB — MAGNESIUM: Magnesium: 1.8 mg/dL (ref 1.7–2.4)

## 2022-06-08 LAB — ABO/RH: ABO/RH(D): A POS

## 2022-06-08 MED ORDER — LOSARTAN POTASSIUM 25 MG PO TABS
25.0000 mg | ORAL_TABLET | Freq: Every day | ORAL | Status: DC
  Administered 2022-06-08: 25 mg via ORAL
  Filled 2022-06-08 (×2): qty 1

## 2022-06-08 MED ORDER — CHLORHEXIDINE GLUCONATE 4 % EX LIQD: 1.0000 | Freq: Once | CUTANEOUS | Status: AC

## 2022-06-08 MED ORDER — HEPARIN (PORCINE) 25000 UT/250ML-% IV SOLN
1050.0000 [IU]/h | INTRAVENOUS | Status: DC
  Administered 2022-06-08: 900 [IU]/h via INTRAVENOUS
  Filled 2022-06-08: qty 250

## 2022-06-08 MED ORDER — CEFAZOLIN SODIUM-DEXTROSE 2-4 GM/100ML-% IV SOLN
2.0000 g | INTRAVENOUS | Status: AC
  Administered 2022-06-09: 2 g via INTRAVENOUS
  Filled 2022-06-08 (×2): qty 100

## 2022-06-08 MED ORDER — ORAL CARE MOUTH RINSE: 15.0000 mL | OROMUCOSAL | Status: DC | PRN

## 2022-06-08 MED ORDER — TEMAZEPAM 15 MG PO CAPS: 15.0000 mg | ORAL_CAPSULE | Freq: Once | ORAL | Status: DC | PRN

## 2022-06-08 MED ORDER — SODIUM CHLORIDE 0.9 % IV SOLN: INTRAVENOUS | Status: DC | PRN

## 2022-06-08 MED ORDER — DEXMEDETOMIDINE HCL IN NACL 400 MCG/100ML IV SOLN
0.1000 ug/kg/h | INTRAVENOUS | Status: AC
  Administered 2022-06-09: 37.16 ug via INTRAVENOUS
  Filled 2022-06-08 (×2): qty 100

## 2022-06-08 MED ORDER — POTASSIUM CHLORIDE 2 MEQ/ML IV SOLN
80.0000 meq | INTRAVENOUS | Status: DC
  Filled 2022-06-08 (×2): qty 40

## 2022-06-08 MED ORDER — MAGNESIUM SULFATE 50 % IJ SOLN
40.0000 meq | INTRAMUSCULAR | Status: DC
  Filled 2022-06-08 (×2): qty 9.85

## 2022-06-08 MED ORDER — BISACODYL 5 MG PO TBEC
5.0000 mg | DELAYED_RELEASE_TABLET | Freq: Once | ORAL | Status: AC
  Administered 2022-06-08: 5 mg via ORAL
  Filled 2022-06-08: qty 1

## 2022-06-08 MED ORDER — NOREPINEPHRINE 4 MG/250ML-% IV SOLN
0.0000 ug/min | INTRAVENOUS | Status: AC
  Administered 2022-06-09: 2 ug/min via INTRAVENOUS
  Filled 2022-06-08 (×2): qty 250

## 2022-06-08 MED ORDER — CHLORHEXIDINE GLUCONATE 0.12 % MT SOLN: 15.0000 mL | Freq: Once | OROMUCOSAL | Status: AC

## 2022-06-08 MED ORDER — MAGNESIUM SULFATE 4 GM/100ML IV SOLN
4.0000 g | Freq: Once | INTRAVENOUS | Status: AC
  Administered 2022-06-08: 4 g via INTRAVENOUS
  Filled 2022-06-08: qty 100

## 2022-06-08 MED ORDER — HEPARIN 30,000 UNITS/1000 ML (OHS) CELLSAVER SOLUTION
Status: DC
  Filled 2022-06-08 (×2): qty 1000

## 2022-06-08 NOTE — Progress Notes (Signed)
Pre Surgical Assessment:   5 M Walk Test 85M=16.70f   5 Meter Walk Test- trial 1: 8.09 seconds  5 Meter Walk Test- trial 2: 8.26 seconds  5 Meter Walk Test- trial 3: 8.18 seconds  5 Meter Walk Test Average: 8.17 seconds   RZenaida Niece PT, DPT Acute Rehabilitation Office [phone]

## 2022-06-08 NOTE — Progress Notes (Signed)
[place] for heparin Indication: atrial fibrillation  Allergies  Allergen Reactions   Adhesive [Tape] Other (See Comments)    Makes the skin feel as if it is burning, will also bruise the skin. Pt. prefers paper tape   Latex Hives and Itching    Patient Measurements: Height: '5\' 9"'$  (175.3 cm) Weight: 74.3 kg (163 lb 12.8 oz) IBW/kg (Calculated) : 70.7   Vital Signs: Temp: 97.9 F (36.6 C) (03/06 1622) Temp Source: Oral (03/06 1622) BP: 144/118 (03/06 1622) Pulse Rate: 89 (03/06 1622)  Labs: Recent Labs    06/06/22 0415 06/07/22 0124 06/08/22 0520 06/08/22 1715  APTT  --   --   --  52*  CREATININE 1.96* 1.45* 1.39*  --      Estimated Creatinine Clearance: 55.1 mL/min (A) (by C-G formula based on SCr of 1.39 mg/dL (H)).   Assessment: 61yom with Afib and HF on apixaban PTA now in need of TAVR.  Apixaban on hold for procedure last dose 3/4pm. Start heparin drip until procedure  Will monitor heparin with aptt as apixaban falsely elevates heparin level.  Last CBC stable no bleeding noted.   aPTT subtherapeutic (52 sec) on infusion at 900 units/hr. No issues with line or bleeding reported per RN.  Goal of Therapy:  aPTT 66-103 seconds Monitor platelets by anticoagulation protocol: Yes   Plan:  Increase heparin drip to 1050 uts/hr  aPTT and heparin level in a.m. heparin to be turned off on call to OR F/u post TAVR for oral AC  Sherlon Handing, PharmD, BCPS Please see amion for complete clinical pharmacist phone list 06/08/2022 5:53 PM

## 2022-06-08 NOTE — Progress Notes (Signed)
Progress Note   Patient: [name] [id number] DOB: Aug 20, 1960 DOA: 06/01/2022     6 DOS: the patient was seen and examined on 06/08/2022   Brief hospital course: [name] was admitted to the hospital with the working diagnosis of heart failure.   62 yo male with the past medical history of aortic stenosis, coronary artery disease, CVA, CKD, and hx of right knee septic arthritis who presented with dyspnea. Patient has been off his medications for several months. He developed worsening dyspnea, lower extremity edema, chest and abdominal pain that prompted him to come to the hospital. On his initial physical examination his blood pressure was 83/69, HR 59, RR 30 and 02 saturation 94%, lungs with no wheezing or rales, heart with S1 and S2 present and rhythmic, abdomen with no distention, positive lower extremity edema.   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  Patient was placed on antibiotic therapy and diuretics.  -3/1, worsening kidney function, echo noted worsening systolic function with severe MR, CHF team consulted -Right heart cath 3/1, markedly elevated filling pressures, low cardiac output, started on Lasix gtt. and milrinone -3/2 night-Lasix GTT discontinued -3/3: Started Amio gtt., restarted milrinone -3/4, milrinone discontinued 3/5: Structural heart team consulting 03/06 plan for TAVR tomorrow.     Assessment and Plan: * Sepsis secondary to UTI ([place]) Klebsiella urine infection, severe sepsis present on admission.  Patient has been responding well to antibiotic therapy, he had Ceftriaxone IV and now transitioned to oral cephalexin, plan to complete 10 days total.   Acute on chronic systolic CHF (congestive heart failure) (HCC) Echocardiogram with reduced Lv systolic function 45 to [id number] with severe concentric hypertrophy, RV systolic function preserved, LA and RA with severe dilatation,  severe mitral valve regurgitation, severe low flow aortic valve stenosis.   Urine output is [id number] Systolic blood pressure is 148 to 154 mmHg.   Continue blood pressure control with losartan, and hydralazine.  Furosemide 40 mg po daily. Plan for TAVR.     Acute renal failure superimposed on chronic kidney disease (HCC) CKD stage 3a.   Renal function with serum ct at 1,39 with K at 4,0 and serum bicarbonate at 22. Plan to continue diuresis with furosemide and follow up renal function in am. Avoid hypotension and nephrotoxic medications.   Anemia Cell count has been stable with hgb at  11.5   CAD (coronary artery disease) No  chest pain   Atrial fibrillation ([place]) Continue rate control with amiodarone Anticoagulation with heparin IV        Subjective: Patient with no chest pain or dyspnea.   Physical Exam: Vitals:   06/08/22 1111 06/08/22 1255 06/08/22 1420 06/08/22 1622  BP: (!) 154/120 (!) 127/90 (!) 148/111 (!) 144/118  Pulse:    89  Resp: (!) 22 (!) 21 (!) 21 16  Temp: 98.7 F (37.1 C) 98.6 F (37 C) 98.2 F (36.8 C) 97.9 F (36.6 C)  TempSrc: Oral Oral Oral Oral  SpO2: 97% 96% 96% 96%  Weight:      Height:       Neurology awake and alert  ENT mild pallor Cardiovascular with S1 and S2 present and regular with systolic murmur a the base with no gallops. Respiratory with no rales or wheezing Abdomen with no distention  Data Reviewed:    Family Communication: no family at the bedside   Disposition: Status is: Inpatient Remains inpatient appropriate because:  TAVR  Planned Discharge Destination: Home    Author: Tawni Millers, MD 06/08/2022 5:21 PM  For on call review [email or web address].

## 2022-06-08 NOTE — Assessment & Plan Note (Signed)
No chest pain
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
## 2022-06-08 NOTE — Assessment & Plan Note (Addendum)
Transitioned to po amiodarone.  Anticoagulation with apixaban
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
## 2022-06-08 NOTE — Plan of Care (Signed)
  Problem: Education: Goal: Ability to demonstrate management of disease process will improve Outcome: Progressing Goal: Ability to verbalize understanding of medication therapies will improve Outcome: Progressing   Problem: Activity: Goal: Capacity to carry out activities will improve Outcome: Progressing   Problem: Cardiac: Goal: Ability to achieve and maintain adequate cardiopulmonary perfusion will improve Outcome: Progressing   Problem: Clinical Measurements: Goal: Ability to maintain clinical measurements within normal limits will improve Outcome: Progressing Goal: Will remain free from infection Outcome: Progressing   Problem: Education: Goal: Ability to demonstrate management of disease process will improve Outcome: Progressing Goal: Ability to verbalize understanding of medication therapies will improve Outcome: Progressing   Problem: Activity: Goal: Capacity to carry out activities will improve Outcome: Progressing   Problem: Cardiac: Goal: Ability to achieve and maintain adequate cardiopulmonary perfusion will improve Outcome: Progressing   Problem: Clinical Measurements: Goal: Ability to maintain clinical measurements within normal limits will improve Outcome: Progressing Goal: Will remain free from infection Outcome: Progressing

## 2022-06-08 NOTE — Progress Notes (Addendum)
Advanced Heart Failure Rounding Note   Subjective:   Taken to cath lab 3/1 for RHC. Found to be in cardiogenic shock. [place] 06/03/22: RA 17 PA 72/41 (50) PCWP 30 (v=45) FICK CI 2.1 Thermo 1.6. Started on milrinone and IV lasix  Echo severe calcific aortic stenosis and severe central mitral regurgitation. EF 45-50%/   Co-ox remains stable off milrinone, 66% today   2.4L in UOP yesterday w/ IV Lasix. Wt down another 3 lb. CVP 9   Scr continues to improve 3.5 -> 2.1-> 1.9-> 1.45 ->1.39   Feels ok today. No current complaints. Denies CP. No dyspnea.   Objective:   Weight Range:  Vital Signs:   Temp:  [97.5 F (36.4 C)-98.6 F (37 C)] 98.6 F (37 C) (03/06 0739) Pulse Rate:  [88-100] 100 (03/06 0416) Resp:  [18-25] 20 (03/06 0739) BP: (132-154)/(93-124) 150/123 (03/06 0739) SpO2:  [96 %-99 %] 96 % (03/06 0739) Weight:  [74.3 kg] 74.3 kg (03/06 0416) Last BM Date : 06/07/22  Weight change: Filed Weights   06/06/22 0604 06/07/22 0518 06/08/22 0416  Weight: 75.1 kg 75.7 kg 74.3 kg    Intake/Output:   Intake/Output Summary (Last 24 hours) at 06/08/2022 0741 Last data filed at 06/08/2022 0420 Gross per 24 hour  Intake 453.47 ml  Output 2350 ml  Net -1896.53 ml     Physical Exam: CVP 9  General:  Well appearing. No respiratory difficulty HEENT: normal Neck: supple. JVD 9-10 cm. Carotids 2+ bilat; no bruits. No lymphadenopathy or thyromegaly appreciated. + rt IJ CVC  Cor: PMI nondisplaced. Irregularly irregular rate & rhythm 3/6 AS and MR murmurs Lungs: clear Abdomen: soft, nontender, nondistended. No hepatosplenomegaly. No bruits or masses. Good bowel sounds. Extremities: no cyanosis, clubbing, rash, edema Neuro: alert & oriented x 3, cranial nerves grossly intact. moves all 4 extremities w/o difficulty. Affect pleasant.   Telemetry: AF low 100s,  burst NSVT longest 13 beats (Personally reviewed)    Labs: Basic Metabolic Panel: Recent Labs  Lab 06/01/22 1824  06/01/22 1829 06/04/22 1930 06/05/22 0500 06/06/22 0415 06/07/22 0124 06/08/22 0520  NA  --    < > 139 137 135 135 138  K  --    < > 3.7 4.2 4.2 4.3 4.0  CL  --    < > 100 100 97* 102 104  CO2  --    < > '27 25 31 24 22  '$ GLUCOSE  --    < > 92 86 97 102* 92  BUN  --    < > 43* 39* 38* 35* 28*  CREATININE  --    < > 2.31* 2.11* 1.96* 1.45* 1.39*  CALCIUM  --    < > 8.6* 8.4* 8.6* 8.7* 8.8*  MG 1.6*   < > 1.8 2.1 1.8 1.9 1.8  PHOS 4.5  --  2.6  --   --   --   --    < > = values in this interval not displayed.    Liver Function Tests: Recent Labs  Lab 06/01/22 1627 06/02/22 0554 06/04/22 0347 06/04/22 1930  AST '22 25 19 21  '$ ALT '28 24 15 21  '$ ALKPHOS 152* 135* 104 123  BILITOT 1.6* 1.2 1.2 1.0  PROT 5.7* 5.6* 6.0* 5.9*  ALBUMIN 2.4* 2.3* 3.1* 2.8*   Recent Labs  Lab 06/01/22 1627  LIPASE 53*   Recent Labs  Lab 06/01/22 1824  AMMONIA 16   CBC: Recent Labs  Lab 06/01/22  1629 06/01/22 1829 06/02/22 0554 06/03/22 0020 06/03/22 1551 06/04/22 0347 06/05/22 0500  WBC 3.9*  --  3.2* 4.8  --  3.5* 4.0  NEUTROABS  --   --  1.8 2.3  --  1.6* 2.0  HGB 11.4*   < > 12.4* 11.7* 13.3  13.3 10.3* 11.5*  HCT 35.9*   < > 36.3* 34.6* 39.0  39.0 30.9* 34.6*  MCV 76.5*  --  75.6* 74.7*  --  74.6* 75.2*  PLT 114*  --  113* 134*  --  126* 143*   < > = values in this interval not displayed.   Cardiac Enzymes: No results for input(s): "CKTOTAL", "CKMB", "CKMBINDEX", "TROPONINI" in the last 168 hours.  BNP: BNP (last 3 results) Recent Labs    05/31/22 1648 06/01/22 1629 06/02/22 0554  BNP 917.1* 670.6* 525.7*   ProBNP (last 3 results) No results for input(s): "PROBNP" in the last 8760 hours.  Other results:  Imaging: No results found.  Medications:   Scheduled Medications:  atorvastatin  20 mg Oral Daily   cephALEXin  250 mg Oral QID   Chlorhexidine Gluconate Cloth  6 each Topical Daily   furosemide  40 mg Oral Daily   hydrALAZINE  50 mg Oral Q8H   losartan   12.5 mg Oral Daily   sodium chloride flush  3 mL Intravenous Q12H   Infusions:  amiodarone 30 mg/hr (06/07/22 2351)   PRN Medications: acetaminophen, hydrALAZINE, ondansetron (ZOFRAN) IV  Assessment/Plan:  1. A/c systolic HF -> cardiogenic shock - Echo 3/1/24LV EF 40-45% severe AS severe MR - Echo 11/23 EF 55% severe AS mild MR - Cath 3/23: LAD apical 90% D2 90% LCx 50%d RCA 40%p (Medical rx) - Admitted with marked volume overload. Diuresis complicated worsening renal function. CO2 16.  - Suspect shock due to HF and not sepsis - RHC 06/03/22: RA 17 PA 72/41 (50) PCWP 30 (v=45) FICK CI 2.1 Thermo 1.6. Started on milrinone and IV lasix - Co-ox 66%, stable. Now off milrinone 3/4 - Continue hydralazine 50 TID  - Increase losartan to 25 mg daily as BP markedly elevated - Plan 1 more dose of 40 IV this lasix this morning, then switch to PO - Discussed with structural team. Planning likely TAVR this admit    2. AS - Severe AS  - Has seen [name] as outpatient for TAVR (had been on hold due to recent septic arthritis)  - As above, inpatient structural team following. Planning likely TAVR this admit   3. MR  - Mod-Severe  - suspect exacerbated by volume overload and HTN - Can re-evaluate after optimization.  - Likely also to improve with TAVR  4. PVCs/NSVT - start IV amio - Keep K> 4.0. Mg > 2.0  5. Chronic A fib. Now with RVR   - Rate elevated after b-blocker stopped and milrinone started - Continue IV amio - On eliquis 5 mg twice a day. Continue for now - Can consider DCCV once TAVR complete.   6. CAD  - primarily non-obstructive with distal/branch vessel disease - no angina - continue medical management  7. AKI on CKD 3b due to ATN/shock - Baseline Scr 1.4-1.5  - Peak Scr 3.5 -> 2.8 -> 2.1-> 1.96->1.45->1.39 with inotropic support and diuresis   8. H/O Septic Knee 01/2022  - Coag negative staph. Completed daptomycin x 4 weeks then switched to linezolid 02/20/22  9.  Hypokalemia/hypomagnesemia - supp Mg (1.8)    10. UTI - UA large  amount of leukocytes.  - Off farxiga would not rechallenge.  - On cephalexin per primary  Length of Stay: [place] PA-C  06/08/2022, 7:41 AM  Advanced Heart Failure Team Pager [phone] (M-F; 7a - 4p)  Please contact [facility] for night-coverage after hours (4p -7a ) and weekends on [email or web address]

## 2022-06-08 NOTE — Assessment & Plan Note (Addendum)
Cell count has been stable with hgb at  10.6

## 2022-06-08 NOTE — Assessment & Plan Note (Addendum)
CKD stage 3a. Hypokalemia.   Today renal function with serum cr at 1,27 with K at 3,5 and serum bicarbonate at 25  Avoid hypotension and nephrotoxic medications.  Follow up renal function in am.

## 2022-06-08 NOTE — Assessment & Plan Note (Addendum)
Klebsiella urine infection, severe sepsis present on admission.  Patient has been responding well to antibiotic therapy, he had Ceftriaxone IV and now transitioned to oral cephalexin. Completed 10 days of therapy.

## 2022-06-08 NOTE — Evaluation (Signed)
Physical Therapy Evaluation Patient Details Name: [name] MRN: [id number] DOB: 1960-11-23 Today's Date: 06/08/2022  History of Present Illness  62 y.o. male presents to [facility] hospital on 06/01/2022 with complaints of SOB, BLE edema, chest and abdominal pain. Pt admitted for management of sepsis 2/2 UTI and emphysematous cystitis. Pt underwent right heart cath on 3/1, found to be in cardiogenic shock. Pt also with severe aortic stenosis. PMH includes HTN, HLD, CHF, OSA, afib, gout, GERD.  Clinical Impression  Pt presents to PT with deficits in strength, endurance, and cardiopulmonary function. Pt is able to mobilize independently but with reduced gait speed. Pt fatigues quickly and requires 2 standing breaks during 5MWT. Pt will benefit from further assessment after TAVR procedure. PT encourages frequent mobilization.  5 Minute Walk Test Results: Distance Ambulated: 420 feet Pre-vitals: 110 BPM, 96% SpO2 Minute 1: 112 BPM, 96% SpO2 Minute 2: 114 BPM, 95% SpO2 Minute 3: 113 BPM, 95% SpO2 Minute 4: 114 BPM, 95% SpO2 Minute 5: 121BPM, 95% SpO2 Post--vitals:  115 BPM, 94% SpO2     Recommendations for follow up therapy are one component of a multi-disciplinary discharge planning process, led by the attending physician.  Recommendations may be updated based on patient status, additional functional criteria and insurance authorization.  Follow Up Recommendations No PT follow up (cardiopulmonary rehab)      Assistance Recommended at Discharge None  Patient can return home with the following       Equipment Recommendations None recommended by PT  Recommendations for Other Services       Functional Status Assessment Patient has had a recent decline in their functional status and demonstrates the ability to make significant improvements in function in a reasonable and predictable amount of time.     Precautions / Restrictions Precautions Precautions: None Restrictions Weight Bearing  Restrictions: No      Mobility  Bed Mobility Overal bed mobility: Independent                  Transfers Overall transfer level: Independent Equipment used: None                    Ambulation/Gait Ambulation/Gait assistance: Modified independent (Device/Increase time) Gait Distance (Feet): 420 Feet Assistive device: None Gait Pattern/deviations: Step-through pattern Gait velocity: reduced Gait velocity interpretation: <1.8 ft/sec, indicate of risk for recurrent falls   General Gait Details: slwoed step-through gait, 2 brief standing rest breaks  Stairs            Wheelchair Mobility    Modified Rankin (Stroke Patients Only)       Balance Overall balance assessment: Independent                                           Pertinent Vitals/Pain Pain Assessment Pain Assessment: No/denies pain    Home Living Family/patient expects to be discharged to:: Private residence Living Arrangements: Spouse/significant other;Children Available Help at Discharge: Family;Available 24 hours/day Type of Home: House Home Access: Stairs to enter Entrance Stairs-Rails: Left Entrance Stairs-Number of Steps: 3   Home Layout: One level Home Equipment: Conservation officer, nature (2 wheels);Cane - single point      Prior Function Prior Level of Function : Independent/Modified Independent             Mobility Comments: PRN use of SPC       Hand Dominance  Dominant Hand: Left    Extremity/Trunk Assessment   Upper Extremity Assessment Upper Extremity Assessment: Overall WFL for tasks assessed    Lower Extremity Assessment Lower Extremity Assessment: Generalized weakness (compared to baseline)    Cervical / Trunk Assessment Cervical / Trunk Assessment: Normal  Communication   Communication: No difficulties  Cognition Arousal/Alertness: Awake/alert Behavior During Therapy: WFL for tasks assessed/performed Overall Cognitive Status: Within  Functional Limits for tasks assessed                                          General Comments General comments (skin integrity, edema, etc.): VSS on RA    Exercises     Assessment/Plan    PT Assessment Patient needs continued PT services  PT Problem List Decreased activity tolerance;Cardiopulmonary status limiting activity       PT Treatment Interventions Therapeutic exercise;Therapeutic activities;Gait training;Patient/family education    PT Goals (Current goals can be found in the Care Plan section)  Acute Rehab PT Goals Patient Stated Goal: to improve endurance PT Goal Formulation: With patient Time For Goal Achievement: 06/22/22 Potential to Achieve Goals: Fair Additional Goals Additional Goal #1: Pt will ambulate for >750' during the 5MWT to demonstrate improvement in endurance and activity tolerance    Frequency Min 2X/week     Co-evaluation               AM-PAC PT "6 Clicks" Mobility  Outcome Measure Help needed turning from your back to your side while in a flat bed without using bedrails?: None Help needed moving from lying on your back to sitting on the side of a flat bed without using bedrails?: None Help needed moving to and from a bed to a chair (including a wheelchair)?: None Help needed standing up from a chair using your arms (e.g., wheelchair or bedside chair)?: None Help needed to walk in hospital room?: None Help needed climbing 3-5 steps with a railing? : None 6 Click Score: 24    End of Session   Activity Tolerance: Patient tolerated treatment well Patient left: in bed;with call bell/phone within reach Nurse Communication: Mobility status PT Visit Diagnosis: Other abnormalities of gait and mobility (R26.89)    Time: [id number] PT Time Calculation (min) (ACUTE ONLY): 16 min   Charges:   PT Evaluation $PT Eval Low Complexity: [place], PT, DPT Acute Rehabilitation Office [phone]   Zenaida Niece 06/08/2022, 11:59 AM

## 2022-06-08 NOTE — Hospital Course (Addendum)
Mr. Homann was admitted to the hospital with the working diagnosis of heart failure.   62 yo male with the past medical history of aortic stenosis, coronary artery disease, CVA, CKD, and hx of right knee septic arthritis who presented with dyspnea. Patient has been off his medications for several months. He developed worsening dyspnea, lower extremity edema, chest and abdominal pain that prompted him to come to the hospital. On his initial physical examination his blood pressure was 83/69, HR 59, RR 30 and 02 saturation 94%, lungs with no wheezing or rales, heart with S1 and S2 present and rhythmic, abdomen with no distention, positive lower extremity edema.   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  Patient was placed on antibiotic therapy and diuretics.  -3/1, worsening kidney function, echo noted worsening systolic function with severe MR, CHF team consulted -Right heart cath 3/1, markedly elevated filling pressures, low cardiac output, started on Lasix gtt. and milrinone -3/2 night-Lasix GTT discontinued -3/3: Started Amio gtt., restarted milrinone -3/4, milrinone discontinued 3/5: Structural heart team consulting  03/07 TAVR  03/08 clinically stable, continue on IV amiodarone.  03/09 amiodarone changed to po. CVC removed.  03/10 patient clinically improved.

## 2022-06-08 NOTE — Assessment & Plan Note (Addendum)
Echocardiogram with reduced Lv systolic function 45 to 41% with severe concentric hypertrophy, RV systolic function preserved, LA and RA with severe dilatation, severe mitral valve regurgitation, severe low flow aortic valve stenosis.   Urine output documented at 740 ml Systolic blood pressure is 130 to 140 with   On entresto and spironolactone.  Holding loop diuretic for now.
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
## 2022-06-09 ENCOUNTER — Encounter (HOSPITAL_COMMUNITY)
Admission: EM | Disposition: A | Discharge status: Home Health | Payer: 59 | Source: Home / Self Care | Attending: Internal Medicine

## 2022-06-09 ENCOUNTER — Encounter (HOSPITAL_COMMUNITY): Payer: Self-pay | Admitting: Internal Medicine

## 2022-06-09 ENCOUNTER — Telehealth (HOSPITAL_COMMUNITY): Payer: Self-pay | Admitting: Licensed Clinical Social Worker

## 2022-06-09 ENCOUNTER — Inpatient Hospital Stay (HOSPITAL_COMMUNITY): Payer: 59

## 2022-06-09 ENCOUNTER — Inpatient Hospital Stay (HOSPITAL_COMMUNITY): Payer: 59 | Admitting: Anesthesiology

## 2022-06-09 ENCOUNTER — Other Ambulatory Visit: Payer: Self-pay | Admitting: Cardiology

## 2022-06-09 DIAGNOSIS — I350 Nonrheumatic aortic (valve) stenosis: Secondary | ICD-10-CM

## 2022-06-09 DIAGNOSIS — I509 Heart failure, unspecified: Secondary | ICD-10-CM

## 2022-06-09 DIAGNOSIS — I130 Hypertensive heart and chronic kidney disease with heart failure and stage 1 through stage 4 chronic kidney disease, or unspecified chronic kidney disease: Secondary | ICD-10-CM

## 2022-06-09 DIAGNOSIS — A4150 Gram-negative sepsis, unspecified: Secondary | ICD-10-CM | POA: Diagnosis not present

## 2022-06-09 DIAGNOSIS — Z1152 Encounter for screening for COVID-19: Secondary | ICD-10-CM | POA: Diagnosis not present

## 2022-06-09 DIAGNOSIS — G9341 Metabolic encephalopathy: Secondary | ICD-10-CM | POA: Diagnosis not present

## 2022-06-09 DIAGNOSIS — N189 Chronic kidney disease, unspecified: Secondary | ICD-10-CM

## 2022-06-09 DIAGNOSIS — Z952 Presence of prosthetic heart valve: Secondary | ICD-10-CM

## 2022-06-09 DIAGNOSIS — I25119 Atherosclerotic heart disease of native coronary artery with unspecified angina pectoris: Secondary | ICD-10-CM

## 2022-06-09 DIAGNOSIS — Z006 Encounter for examination for normal comparison and control in clinical research program: Secondary | ICD-10-CM | POA: Diagnosis not present

## 2022-06-09 DIAGNOSIS — D631 Anemia in chronic kidney disease: Secondary | ICD-10-CM

## 2022-06-09 HISTORY — PX: TRANSCATHETER AORTIC VALVE REPLACEMENT, TRANSFEMORAL: SHX6400

## 2022-06-09 HISTORY — PX: INTRAOPERATIVE TRANSTHORACIC ECHOCARDIOGRAM: SHX6523

## 2022-06-09 HISTORY — DX: Presence of prosthetic heart valve: Z95.2

## 2022-06-09 LAB — CBC
HCT: 32.7 % — ABNORMAL LOW (ref 39.0–52.0)
Hemoglobin: 10.6 g/dL — ABNORMAL LOW (ref 13.0–17.0)
MCH: 24.7 pg — ABNORMAL LOW (ref 26.0–34.0)
MCHC: 32.4 g/dL (ref 30.0–36.0)
MCV: 76 fL — ABNORMAL LOW (ref 80.0–100.0)
Platelets: 143 10*3/uL — ABNORMAL LOW (ref 150–400)
RBC: 4.3 MIL/uL (ref 4.22–5.81)
RDW: 20.7 % — ABNORMAL HIGH (ref 11.5–15.5)
WBC: 3.8 10*3/uL — ABNORMAL LOW (ref 4.0–10.5)
nRBC: 0 % (ref 0.0–0.2)

## 2022-06-09 LAB — BASIC METABOLIC PANEL
Anion gap: 9 (ref 5–15)
BUN: 21 mg/dL (ref 8–23)
CO2: 25 mmol/L (ref 22–32)
Calcium: 8.8 mg/dL — ABNORMAL LOW (ref 8.9–10.3)
Chloride: 104 mmol/L (ref 98–111)
Creatinine, Ser: 1.23 mg/dL (ref 0.61–1.24)
GFR, Estimated: >60 mL/min (ref >60)
Glucose, Bld: 88 mg/dL (ref 70–99)
Potassium: 3.3 mmol/L — ABNORMAL LOW (ref 3.5–5.1)
Sodium: 138 mmol/L (ref 135–145)

## 2022-06-09 LAB — COOXEMETRY PANEL
Carboxyhemoglobin: 4 % — ABNORMAL HIGH (ref 0.5–1.5)
Methemoglobin: <0.7 % (ref 0.0–1.5)
O2 Saturation: 73.5 %
Total hemoglobin: 11 g/dL — ABNORMAL LOW (ref 12.0–16.0)

## 2022-06-09 LAB — MAGNESIUM: Magnesium: 2.2 mg/dL (ref 1.7–2.4)

## 2022-06-09 LAB — APTT: aPTT: 53 seconds — ABNORMAL HIGH (ref 24–36)

## 2022-06-09 LAB — ECHOCARDIOGRAM LIMITED
AR max vel: 1.42 cm2
AV Area VTI: 1.21 cm2
AV Area mean vel: 1.42 cm2
AV Mean grad: 19.5 mmHg
AV Peak grad: 24.6 mmHg
Ao pk vel: 2.48 m/s
Est EF: 45

## 2022-06-09 LAB — POCT I-STAT, CHEM 8
BUN: 18 mg/dL (ref 8–23)
BUN: 19 mg/dL (ref 8–23)
Calcium, Ion: 1.18 mmol/L (ref 1.15–1.40)
Calcium, Ion: 1.23 mmol/L (ref 1.15–1.40)
Chloride: 104 mmol/L (ref 98–111)
Chloride: 104 mmol/L (ref 98–111)
Creatinine, Ser: 1.1 mg/dL (ref 0.61–1.24)
Creatinine, Ser: 1.1 mg/dL (ref 0.61–1.24)
Glucose, Bld: 90 mg/dL (ref 70–99)
Glucose, Bld: 103 mg/dL — ABNORMAL HIGH (ref 70–99)
HCT: 38 % — ABNORMAL LOW (ref 39.0–52.0)
HCT: 35 % — ABNORMAL LOW (ref 39.0–52.0)
Hemoglobin: 12.9 g/dL — ABNORMAL LOW (ref 13.0–17.0)
Hemoglobin: 11.9 g/dL — ABNORMAL LOW (ref 13.0–17.0)
Potassium: 3.2 mmol/L — ABNORMAL LOW (ref 3.5–5.1)
Potassium: 3.3 mmol/L — ABNORMAL LOW (ref 3.5–5.1)
Sodium: 140 mmol/L (ref 135–145)
Sodium: 140 mmol/L (ref 135–145)
TCO2: 25 mmol/L (ref 22–32)
TCO2: 24 mmol/L (ref 22–32)

## 2022-06-09 SURGERY — IMPLANTATION, AORTIC VALVE, TRANSCATHETER, FEMORAL APPROACH
Anesthesia: General

## 2022-06-09 MED ORDER — HEPARIN SODIUM (PORCINE) 1000 UNIT/ML IJ SOLN
INTRAMUSCULAR | Status: AC
  Filled 2022-06-09: qty 1

## 2022-06-09 MED ORDER — LACTATED RINGERS IV SOLN: INTRAVENOUS | Status: DC

## 2022-06-09 MED ORDER — FENTANYL CITRATE (PF) 100 MCG/2ML IJ SOLN
INTRAMUSCULAR | Status: DC | PRN
  Administered 2022-06-09: 100 ug via INTRAVENOUS

## 2022-06-09 MED ORDER — SODIUM CHLORIDE 0.9 % IV SOLN: 250.0000 mL | INTRAVENOUS | Status: DC

## 2022-06-09 MED ORDER — PROPOFOL 500 MG/50ML IV EMUL
INTRAVENOUS | Status: DC | PRN
  Administered 2022-06-09: 20 ug/kg/min via INTRAVENOUS

## 2022-06-09 MED ORDER — HYDRALAZINE HCL 20 MG/ML IJ SOLN: 20.0000 mg | Freq: Once | INTRAMUSCULAR | Status: DC

## 2022-06-09 MED ORDER — CHLORHEXIDINE GLUCONATE 0.12 % MT SOLN
OROMUCOSAL | Status: AC
  Administered 2022-06-09: 15 mL via OROMUCOSAL
  Filled 2022-06-09: qty 15

## 2022-06-09 MED ORDER — SODIUM CHLORIDE 0.9 % IV SOLN: INTRAVENOUS | Status: DC

## 2022-06-09 MED ORDER — SODIUM CHLORIDE 0.9% FLUSH
3.0000 mL | Freq: Two times a day (BID) | INTRAVENOUS | Status: DC
  Administered 2022-06-10 – 2022-06-13 (×6): 3 mL via INTRAVENOUS

## 2022-06-09 MED ORDER — HYDRALAZINE HCL 20 MG/ML IJ SOLN
10.0000 mg | Freq: Once | INTRAMUSCULAR | Status: AC
  Administered 2022-06-09: 10 mg via INTRAVENOUS

## 2022-06-09 MED ORDER — HEPARIN SODIUM (PORCINE) 1000 UNIT/ML IJ SOLN
INTRAMUSCULAR | Status: DC | PRN
  Administered 2022-06-09: 11000 [IU] via INTRAVENOUS

## 2022-06-09 MED ORDER — SODIUM CHLORIDE 0.9 % IV SOLN
INTRAVENOUS | Status: AC
  Administered 2022-06-09: 50 mL/h via INTRAVENOUS

## 2022-06-09 MED ORDER — MIDAZOLAM HCL 2 MG/2ML IJ SOLN
INTRAMUSCULAR | Status: DC | PRN
  Administered 2022-06-09: 2 mg via INTRAVENOUS

## 2022-06-09 MED ORDER — LIDOCAINE HCL (PF) 1 % IJ SOLN
INTRAMUSCULAR | Status: AC
  Filled 2022-06-09: qty 30

## 2022-06-09 MED ORDER — CHLORHEXIDINE GLUCONATE 4 % EX LIQD
CUTANEOUS | Status: AC
  Administered 2022-06-09: 1 via TOPICAL
  Filled 2022-06-09: qty 60

## 2022-06-09 MED ORDER — SODIUM CHLORIDE 0.9 % IV SOLN: 250.0000 mL | INTRAVENOUS | Status: DC | PRN

## 2022-06-09 MED ORDER — OXYCODONE HCL 5 MG PO TABS: 5.0000 mg | ORAL_TABLET | ORAL | Status: DC | PRN

## 2022-06-09 MED ORDER — HYDRALAZINE HCL 20 MG/ML IJ SOLN: 10.0000 mg | Freq: Once | INTRAMUSCULAR | Status: AC

## 2022-06-09 MED ORDER — CEFAZOLIN SODIUM-DEXTROSE 2-4 GM/100ML-% IV SOLN
2.0000 g | Freq: Three times a day (TID) | INTRAVENOUS | Status: AC
  Administered 2022-06-09 – 2022-06-10 (×2): 2 g via INTRAVENOUS
  Filled 2022-06-09 (×2): qty 100

## 2022-06-09 MED ORDER — HYDRALAZINE HCL 20 MG/ML IJ SOLN
INTRAMUSCULAR | Status: AC
  Administered 2022-06-09: 10 mg via INTRAVENOUS
  Filled 2022-06-09: qty 1

## 2022-06-09 MED ORDER — ACETAMINOPHEN 325 MG PO TABS: 650.0000 mg | ORAL_TABLET | Freq: Four times a day (QID) | ORAL | Status: DC | PRN

## 2022-06-09 MED ORDER — TRAMADOL HCL 50 MG PO TABS: 50.0000 mg | ORAL_TABLET | ORAL | Status: DC | PRN

## 2022-06-09 MED ORDER — IOHEXOL 350 MG/ML SOLN
INTRAVENOUS | Status: DC | PRN
  Administered 2022-06-09: 60 mL via INTRA_ARTERIAL

## 2022-06-09 MED ORDER — ONDANSETRON HCL 4 MG/2ML IJ SOLN
4.0000 mg | Freq: Four times a day (QID) | INTRAMUSCULAR | Status: DC | PRN
  Filled 2022-06-09: qty 2

## 2022-06-09 MED ORDER — HEPARIN (PORCINE) IN NACL 1000-0.9 UT/500ML-% IV SOLN
INTRAVENOUS | Status: DC | PRN
  Administered 2022-06-09 (×2): 500 mL

## 2022-06-09 MED ORDER — PROTAMINE SULFATE 10 MG/ML IV SOLN
INTRAVENOUS | Status: DC | PRN
  Administered 2022-06-09: 110 mg via INTRAVENOUS

## 2022-06-09 MED ORDER — MORPHINE SULFATE (PF) 2 MG/ML IV SOLN: 1.0000 mg | INTRAVENOUS | Status: DC | PRN

## 2022-06-09 MED ORDER — POTASSIUM CHLORIDE CRYS ER 20 MEQ PO TBCR
60.0000 meq | EXTENDED_RELEASE_TABLET | Freq: Once | ORAL | Status: AC
  Administered 2022-06-09: 60 meq via ORAL
  Filled 2022-06-09: qty 3

## 2022-06-09 MED ORDER — NITROGLYCERIN IN D5W 200-5 MCG/ML-% IV SOLN
0.0000 ug/min | INTRAVENOUS | Status: DC
  Filled 2022-06-09: qty 250

## 2022-06-09 MED ORDER — ACETAMINOPHEN 500 MG PO TABS: 500.0000 mg | ORAL_TABLET | Freq: Three times a day (TID) | ORAL | Status: DC | PRN

## 2022-06-09 MED ORDER — LIDOCAINE HCL (PF) 1 % IJ SOLN
INTRAMUSCULAR | Status: DC | PRN
  Administered 2022-06-09 (×2): 12 mL

## 2022-06-09 MED ORDER — POTASSIUM CHLORIDE 20 MEQ PO PACK
40.0000 meq | PACK | Freq: Once | ORAL | Status: AC
  Administered 2022-06-10: 40 meq via ORAL
  Filled 2022-06-09: qty 2

## 2022-06-09 MED ORDER — ACETAMINOPHEN 650 MG RE SUPP: 650.0000 mg | Freq: Four times a day (QID) | RECTAL | Status: DC | PRN

## 2022-06-09 MED ORDER — POTASSIUM CHLORIDE CRYS ER 20 MEQ PO TBCR: 40.0000 meq | EXTENDED_RELEASE_TABLET | Freq: Once | ORAL | Status: DC

## 2022-06-09 MED ORDER — PROTAMINE SULFATE 10 MG/ML IV SOLN
INTRAVENOUS | Status: AC
  Filled 2022-06-09: qty 15

## 2022-06-09 MED ORDER — SODIUM CHLORIDE 0.9% FLUSH: 3.0000 mL | INTRAVENOUS | Status: DC | PRN

## 2022-06-09 MED ORDER — ALLOPURINOL 100 MG PO TABS
100.0000 mg | ORAL_TABLET | Freq: Every day | ORAL | Status: DC
  Administered 2022-06-10 – 2022-06-13 (×4): 100 mg via ORAL
  Filled 2022-06-09 (×4): qty 1

## 2022-06-09 MED ORDER — APIXABAN 5 MG PO TABS
5.0000 mg | ORAL_TABLET | Freq: Two times a day (BID) | ORAL | Status: DC
  Administered 2022-06-10 – 2022-06-13 (×7): 5 mg via ORAL
  Filled 2022-06-09 (×7): qty 1

## 2022-06-09 SURGICAL SUPPLY — 30 items
BAG SNAP BAND KOVER 36X36 (MISCELLANEOUS) ×2 IMPLANT
CABLE ADAPT PACING TEMP 12FT (ADAPTER) IMPLANT
CATH COMMANDER DELIVERY SYS 29 (CATHETERS) IMPLANT
CATH INFINITI 5FR ANG PIGTAIL (CATHETERS) IMPLANT
CATH INFINITI 6F AL2 (CATHETERS) IMPLANT
CATH S G BIP PACING (CATHETERS) IMPLANT
CLOSURE MYNX CONTROL 6F/7F (Vascular Products) IMPLANT
CLOSURE PERCLOSE PROSTYLE (VASCULAR PRODUCTS) IMPLANT
CRIMPER (MISCELLANEOUS) IMPLANT
DEVICE INFLATION ATRION QL38 (MISCELLANEOUS) IMPLANT
ELECT DEFIB PAD ADLT CADENCE (PAD) IMPLANT
KIT HEART LEFT (KITS) ×1 IMPLANT
KIT MICROPUNCTURE NIT STIFF (SHEATH) IMPLANT
KIT SAPIAN 3 ULTRA RESILIA 29 (Valve) IMPLANT
PACK CARDIAC CATHETERIZATION (CUSTOM PROCEDURE TRAY) ×1 IMPLANT
SHEATH BRITE TIP 7FR 35CM (SHEATH) IMPLANT
SHEATH INTRODUCER SET 29 (SHEATH) IMPLANT
SHEATH PINNACLE 6F 10CM (SHEATH) IMPLANT
SHEATH PINNACLE 8F 10CM (SHEATH) IMPLANT
SHEATH PROBE COVER 6X72 (BAG) IMPLANT
SLEEVE REPOSITIONING LENGTH 30 (MISCELLANEOUS) IMPLANT
STOPCOCK MORSE 400PSI 3WAY (MISCELLANEOUS) ×2 IMPLANT
TRANSDUCER W/STOPCOCK (MISCELLANEOUS) ×2 IMPLANT
TUBING ART PRESS 72  MALE/FEM (TUBING) ×1
TUBING ART PRESS 72 MALE/FEM (TUBING) IMPLANT
WIRE AMPLATZ SS-J .035X180CM (WIRE) IMPLANT
WIRE EMERALD 3MM-J .035X150CM (WIRE) IMPLANT
WIRE EMERALD 3MM-J .035X260CM (WIRE) IMPLANT
WIRE EMERALD ST .035X260CM (WIRE) IMPLANT
WIRE SAFARI SM CURVE 275 (WIRE) IMPLANT

## 2022-06-09 NOTE — Progress Notes (Signed)
Progress Note   Patient: [name] [id number] DOB: 1961-03-02 DOA: 06/01/2022     7 DOS: the patient was seen and examined on 06/09/2022   Brief hospital course: [name] was admitted to the hospital with the working diagnosis of heart failure.   62 yo male with the past medical history of aortic stenosis, coronary artery disease, CVA, CKD, and hx of right knee septic arthritis who presented with dyspnea. Patient has been off his medications for several months. He developed worsening dyspnea, lower extremity edema, chest and abdominal pain that prompted him to come to the hospital. On his initial physical examination his blood pressure was 83/69, HR 59, RR 30 and 02 saturation 94%, lungs with no wheezing or rales, heart with S1 and S2 present and rhythmic, abdomen with no distention, positive lower extremity edema.   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  Patient was placed on antibiotic therapy and diuretics.  -3/1, worsening kidney function, echo noted worsening systolic function with severe MR, CHF team consulted -Right heart cath 3/1, markedly elevated filling pressures, low cardiac output, started on Lasix gtt. and milrinone -3/2 night-Lasix GTT discontinued -3/3: Started Amio gtt., restarted milrinone -3/4, milrinone discontinued 3/5: Structural heart team consulting  03/07 TAVR today.     Assessment and Plan: * Sepsis secondary to UTI ([place]) Klebsiella urine infection, severe sepsis present on admission.  Patient has been responding well to antibiotic therapy, he had Ceftriaxone IV and now transitioned to oral cephalexin, plan to complete 10 days total.   Acute on chronic systolic CHF (congestive heart failure) (HCC) Echocardiogram with reduced Lv systolic function 45 to [id number] with severe concentric hypertrophy, RV systolic function preserved, LA and RA with severe dilatation, severe mitral  valve regurgitation, severe low flow aortic valve stenosis.   Urine output is 123XX123 ml Systolic blood pressure is 160 to 123XX123 with diastolic 123XX123 to 123456  mmHg.   Continue blood pressure control with losartan, and hydralazine.  Furosemide 40 mg po daily. Further adjustment to antihypertensive regimen after valve procedure.     Acute renal failure superimposed on chronic kidney disease (HCC) CKD stage 3a. Hypokalemia.   Stable renal function with serum cr at 1,23 with K at 3,3 and serum bicarbonate at 21. Mg 2,2 Na 138   Had 60 meq Kcl today.  Avoid hypotension and nephrotoxic medications.  Follow up renal function in am.   Anemia Cell count has been stable with hgb at  11.5   HTN (hypertension) Uncontrolled hypertension with elevated diastolic blood pressure.  Continue with hydralazine and losartan, further adjustments post procedure.   CAD (coronary artery disease) No  chest pain   Atrial fibrillation (HCC) Continue rate control with amiodarone Anticoagulation with heparin IV        Subjective: Patient is feeling well, no chest pain or dyspnea   Physical Exam: Vitals:   06/08/22 2248 06/09/22 0531 06/09/22 0749 06/09/22 1110  BP:  (!) 161/129 (!) 157/117 (!) 174/104  Pulse: 100 (!) 109 89 (!) 114  Resp:   18 18  Temp: 97.9 F (36.6 C) 98.8 F (37.1 C) 98.6 F (37 C) 98.1 F (36.7 C)  TempSrc: Oral Oral Oral   SpO2: 98% 97%  97%  Weight:  73.5 kg  73.9 kg  Height:    '5\' 9"'$  (1.753 m)   Neurology awake and alert ENT with mild pallor Cardiovascular with S1 and S2 present  and rhythmic with no gallops, positive systolic murmur at the base.  Respiratory with no rales or wheezing Abdomen with no distention  No lower extremity edema  Data Reviewed:    Family Communication: no family at the bedside   Disposition: Status is: Inpatient Remains inpatient appropriate because: TAVR today   Planned Discharge Destination: Home    Author: Tawni Millers, MD 06/09/2022 11:44 AM  For on call review [email or web address].

## 2022-06-09 NOTE — Telephone Encounter (Signed)
H&V Care Navigation CSW Progress Note  Clinical Social Worker met with pt in hospital and provided with list of housing options in his price range.  Encouraged pt to review and start applying to get on waitlist.   [place]: No Food Insecurity (06/02/2022)  Housing: High Risk (06/02/2022)  Transportation Needs: No Transportation Needs (06/02/2022)  Utilities: Not At Risk (06/02/2022)  Alcohol Screen: Low Risk  (09/30/2021)  Depression (PHQ2-9): Medium Risk (05/31/2022)  Financial Resource Strain: High Risk (06/06/2022)  Physical Activity: Insufficiently Active (09/30/2021)  Social Connections: Moderately Integrated (09/30/2021)  Stress: No Stress Concern Present (09/30/2021)  Tobacco Use: Low Risk  (06/06/2022)    Jorge Ny, LCSW Clinical Social Worker [facility] Desk#: [phone] Cell#: [phone]

## 2022-06-09 NOTE — Plan of Care (Signed)
Problem: Education: Goal: Ability to demonstrate management of disease process will improve 06/09/2022 2330 by Driscilla Grammes, Ardeen Fillers, RN Outcome: Progressing 06/09/2022 2330 by Driscilla Grammes, Ardeen Fillers, RN Outcome: Progressing Goal: Ability to verbalize understanding of medication therapies will improve 06/09/2022 2330 by Tillie Fantasia, RN Outcome: Progressing 06/09/2022 2330 by Driscilla Grammes, Ardeen Fillers, RN Outcome: Progressing Goal: Individualized Educational Video(s) 06/09/2022 2330 by Driscilla Grammes, Ardeen Fillers, RN Outcome: Progressing 06/09/2022 2330 by Driscilla Grammes, Ardeen Fillers, RN Outcome: Progressing   Problem: Activity: Goal: Capacity to carry out activities will improve 06/09/2022 2330 by Driscilla Grammes, Ardeen Fillers, RN Outcome: Progressing 06/09/2022 2330 by Driscilla Grammes, Ardeen Fillers, RN Outcome: Progressing   Problem: Cardiac: Goal: Ability to achieve and maintain adequate cardiopulmonary perfusion will improve 06/09/2022 2330 by Driscilla Grammes, Ardeen Fillers, RN Outcome: Progressing 06/09/2022 2330 by Driscilla Grammes, Ardeen Fillers, RN Outcome: Progressing   Problem: Health Behavior/Discharge Planning: Goal: Ability to manage health-related needs will improve 06/09/2022 2330 by Tillie Fantasia, RN Outcome: Progressing 06/09/2022 2330 by Driscilla Grammes, Ardeen Fillers, RN Outcome: Progressing   Problem: Clinical Measurements: Goal: Ability to maintain clinical measurements within normal limits will improve 06/09/2022 2330 by Tillie Fantasia, RN Outcome: Progressing 06/09/2022 2330 by Driscilla Grammes, Ardeen Fillers, RN Outcome: Progressing Goal: Will remain free from infection 06/09/2022 2330 by Tillie Fantasia, RN Outcome: Progressing 06/09/2022 2330 by Driscilla Grammes, Ardeen Fillers, RN Outcome: Progressing Goal: Diagnostic test results will improve 06/09/2022 2330 by Tillie Fantasia, RN Outcome: Progressing 06/09/2022 2330 by Driscilla Grammes, Ardeen Fillers, RN Outcome: Progressing Goal: Respiratory complications will improve 06/09/2022 2330 by Tillie Fantasia, RN Outcome: Progressing 06/09/2022 2330 by Driscilla Grammes, Ardeen Fillers, RN Outcome: Progressing Goal: Cardiovascular complication will be avoided 06/09/2022 2330 by Tillie Fantasia, RN Outcome: Progressing 06/09/2022 2330 by Driscilla Grammes, Ardeen Fillers, RN Outcome: Progressing   Problem: Activity: Goal: Risk for activity intolerance will decrease 06/09/2022 2330 by Driscilla Grammes, Ardeen Fillers, RN Outcome: Progressing 06/09/2022 2330 by Driscilla Grammes, Ardeen Fillers, RN Outcome: Progressing   Problem: Nutrition: Goal: Adequate nutrition will be maintained 06/09/2022 2330 by Driscilla Grammes, Ardeen Fillers, RN Outcome: Progressing 06/09/2022 2330 by Driscilla Grammes, Ardeen Fillers, RN Outcome: Progressing   Problem: Coping: Goal: Level of anxiety will decrease 06/09/2022 2330 by Driscilla Grammes, Ardeen Fillers, RN Outcome: Progressing 06/09/2022 2330 by Driscilla Grammes, Ardeen Fillers, RN Outcome: Progressing   Problem: Elimination: Goal: Will not experience complications related to bowel motility 06/09/2022 2330 by Tillie Fantasia, RN Outcome: Progressing 06/09/2022 2330 by Driscilla Grammes, Ardeen Fillers, RN Outcome: Progressing Goal: Will not experience complications related to urinary retention 06/09/2022 2330 by Tillie Fantasia, RN Outcome: Progressing 06/09/2022 2330 by Driscilla Grammes, Ardeen Fillers, RN Outcome: Progressing   Problem: Pain Managment: Goal: General experience of comfort will improve 06/09/2022 2330 by Driscilla Grammes, Ardeen Fillers, RN Outcome: Progressing 06/09/2022 2330 by Driscilla Grammes, Ardeen Fillers, RN Outcome: Progressing   Problem: Safety: Goal: Ability to remain free from injury will improve 06/09/2022 2330 by Tillie Fantasia, RN Outcome: Progressing 06/09/2022 2330 by Driscilla Grammes, Ardeen Fillers, RN Outcome: Progressing    Problem: Skin Integrity: Goal: Risk for impaired skin integrity will decrease 06/09/2022 2330 by Tillie Fantasia, RN Outcome: Progressing 06/09/2022 2330 by Driscilla Grammes, Ardeen Fillers, RN Outcome: Progressing   Problem: Education: Goal: Understanding of CV disease, CV risk reduction, and recovery process will improve 06/09/2022 2330 by [employer]  Lanny Hurst, RN Outcome: Progressing 06/09/2022 2330 by Driscilla Grammes, Ardeen Fillers, RN Outcome: Progressing Goal: Individualized Educational Video(s) 06/09/2022 2330 by Driscilla Grammes, Ardeen Fillers, RN Outcome: Progressing 06/09/2022 2330 by Driscilla Grammes, Ardeen Fillers, RN Outcome: Progressing   Problem: Activity: Goal: Ability to return to baseline activity level will improve 06/09/2022 2330 by Driscilla Grammes, Ardeen Fillers, RN Outcome: Progressing 06/09/2022 2330 by Driscilla Grammes, Ardeen Fillers, RN Outcome: Progressing   Problem: Cardiovascular: Goal: Ability to achieve and maintain adequate cardiovascular perfusion will improve 06/09/2022 2330 by Tillie Fantasia, RN Outcome: Progressing 06/09/2022 2330 by Driscilla Grammes, Ardeen Fillers, RN Outcome: Progressing Goal: Vascular access site(s) Level 0-1 will be maintained 06/09/2022 2330 by Tillie Fantasia, RN Outcome: Progressing 06/09/2022 2330 by Driscilla Grammes, Ardeen Fillers, RN Outcome: Progressing   Problem: Health Behavior/Discharge Planning: Goal: Ability to safely manage health-related needs after discharge will improve 06/09/2022 2330 by Tillie Fantasia, RN Outcome: Progressing 06/09/2022 2330 by Driscilla Grammes, Ardeen Fillers, RN Outcome: Progressing

## 2022-06-09 NOTE — Anesthesia Preprocedure Evaluation (Signed)
Anesthesia Evaluation  Patient identified by MRN, date of birth, ID band Patient awake    Reviewed: Allergy & Precautions, NPO status , Patient's Chart, lab work & pertinent test results  Airway Mallampati: II  TM Distance: >3 FB Neck ROM: Full    Dental no notable dental hx. (+) Teeth Intact, Dental Advisory Given   Pulmonary sleep apnea , pneumonia   Pulmonary exam normal        Cardiovascular hypertension, Pt. on medications + angina  + CAD, + Peripheral Vascular Disease and +CHF  + dysrhythmias (on Eliquis) Atrial Fibrillation  Rhythm:Regular Rate:Normal  ECHO 09/23: 1. Left ventricular ejection fraction, by estimation, is 60 to 65%. The  left ventricle has normal function. The left ventricle has no regional  wall motion abnormalities. There is severe concentric left ventricular  hypertrophy. Left ventricular diastolic  function could not be evaluated. Elevated left atrial pressure.  2. Right ventricular systolic function is normal. The right ventricular  size is normal. There is mildly elevated pulmonary artery systolic  pressure.  3. Left atrial size was severely dilated.  4. Right atrial size was mildly dilated.  5. The mitral valve is normal in structure. Mild mitral valve  regurgitation. No evidence of mitral stenosis.  6. The aortic valve is tricuspid. Aortic valve regurgitation is mild.  Moderate to severe aortic valve stenosis.  7. Aortic dilatation noted. There is mild dilatation of the ascending  aorta, measuring 40 mm.  8. The inferior vena cava is normal in size with greater than 50%  respiratory variability, suggesting right atrial pressure of 3 mmHg.    Neuro/Psych  Headaches  negative psych ROS   GI/Hepatic ,GERD  Medicated,,  Endo/Other  negative endocrine ROS    Renal/GU CRFRenal disease  negative genitourinary   Musculoskeletal  (+) Arthritis , Osteoarthritis,  Septic arthritis     Abdominal Normal abdominal exam  (+)   Peds  Hematology  (+) Blood dyscrasia, anemia   Anesthesia Other Findings   Reproductive/Obstetrics (+) Breast feeding                              Anesthesia Physical Anesthesia Plan  ASA: 4  Anesthesia Plan: General   Post-op Pain Management: Minimal or no pain anticipated   Induction: Intravenous  PONV Risk Score and Plan: 2 and Ondansetron, Dexamethasone and Treatment may vary due to age or medical condition  Airway Management Planned: Oral ETT  Additional Equipment: Arterial line  Intra-op Plan:   Post-operative Plan: Extubation in OR  Informed Consent: I have reviewed the patients History and Physical, chart, labs and discussed the procedure including the risks, benefits and alternatives for the proposed anesthesia with the patient or authorized representative who has indicated his/her understanding and acceptance.     Dental advisory given  Plan Discussed with: CRNA and Anesthesiologist  Anesthesia Plan Comments: (Lab Results)        Anesthesia Quick Evaluation

## 2022-06-09 NOTE — Assessment & Plan Note (Addendum)
Patient on entresto and spironolactone for blood pressure control.

## 2022-06-09 NOTE — Interval H&P Note (Signed)
History and Physical Interval Note:  06/09/2022 6:30 AM  [name]  has presented today for surgery, with the diagnosis of Severe Aortic Stenosis.  The various methods of treatment have been discussed with the patient and family. After consideration of risks, benefits and other options for treatment, the patient has consented to  Procedure(s): Transcatheter Aortic Valve Replacement, Transfemoral (N/A) INTRAOPERATIVE TRANSTHORACIC ECHOCARDIOGRAM (N/A) as a surgical intervention.  The patient's history has been reviewed, patient examined, no change in status, stable for surgery.  I have reviewed the patient's chart and labs.  Questions were answered to the patient's satisfaction.     [name]

## 2022-06-09 NOTE — Progress Notes (Signed)
Pt arrived from ...cath.., A/ox 4...pt denies any pain, MD aware,CCMD called. CHG bath given,no further needs at this time

## 2022-06-09 NOTE — Anesthesia Postprocedure Evaluation (Signed)
Anesthesia Post Note  Patient: [name]  Procedure(s) Performed: Transcatheter Aortic Valve Replacement, Transfemoral INTRAOPERATIVE TRANSTHORACIC ECHOCARDIOGRAM     Patient location during evaluation: PACU Anesthesia Type: General Level of consciousness: awake and alert Pain management: pain level controlled Vital Signs Assessment: post-procedure vital signs reviewed and stable Respiratory status: spontaneous breathing, nonlabored ventilation, respiratory function stable and patient connected to nasal cannula oxygen Cardiovascular status: blood pressure returned to baseline and stable Postop Assessment: no apparent nausea or vomiting Anesthetic complications: no   There were no known notable events for this encounter.  Last Vitals:  Vitals:   06/09/22 1500 06/09/22 1505  BP: (!) 89/66 (!) 89/62  Pulse: (!) 50 (!) 53  Resp: 12 17  Temp:    SpO2: 98% 97%    Last Pain:  Vitals:   06/09/22 1455  TempSrc:   PainSc: 0-No pain                 Jaimon Bugaj

## 2022-06-09 NOTE — Transfer of Care (Signed)
Immediate Anesthesia Transfer of Care Note  Patient: [name]  Procedure(s) Performed: Transcatheter Aortic Valve Replacement, Transfemoral INTRAOPERATIVE TRANSTHORACIC ECHOCARDIOGRAM  Patient Location: Cath Lab  Anesthesia Type:MAC  Level of Consciousness: drowsy and patient cooperative  Airway & Oxygen Therapy: Patient Spontanous Breathing and Patient connected to nasal cannula oxygen  Post-op Assessment: Report given to RN and Post -op Vital signs reviewed and stable  Post vital signs: Reviewed and stable  Last Vitals:  Vitals Value Taken Time  BP 91/69 06/09/22 1438  Temp    Pulse 61 06/09/22 1443  Resp 26 06/09/22 1443  SpO2 96 % 06/09/22 1443  Vitals shown include unvalidated device data.  Last Pain:  Vitals:   06/09/22 1135  TempSrc:   PainSc: 0-No pain      Patients Stated Pain Goal: 0 ([id number])  Complications: There were no known notable events for this encounter.

## 2022-06-09 NOTE — Op Note (Signed)
[facility]   MULTIDISCIPLINARY HEART VALVE TEAM   TAVR OPERATIVE NOTE   Date of Procedure:  06/09/2022  Preoperative Diagnosis: Severe Aortic Stenosis   Postoperative Diagnosis: Same   Procedure:   Transcatheter Aortic Valve Replacement - Percutaneous Transfemoral Approach  Edwards Sapien 3 Ultra Resilia THV (size 29 mm, serial # [id number])   Co-Surgeons:  [name], MD and Sherren Mocha, MD  Anesthesiologist:  Janeece Riggers MD  Echocardiographer:  Rudean Haskell, MD  Pre-operative Echo Findings: Severe aortic stenosis Mildly depressed left ventricular systolic function Moderate-severe MR  Post-operative Echo Findings: No paravalvular leak Unchanged left ventricular systolic function Unchanged MR  BRIEF CLINICAL NOTE AND INDICATIONS FOR SURGERY  62 yo male with NYHA 4 symptoms of 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 He has been seen in inpatient consultation, and because of significant comorbid medical problems, degree of heart failure, and social barriers to care, we elected to proceed with TAVR while he is hospitalized.   During the course of the patient's preoperative work up they have been evaluated comprehensively by a multidisciplinary team of specialists coordinated through the [facility] in the [facility].  They have been demonstrated to suffer from symptomatic severe aortic stenosis as noted above. The patient has been counseled extensively as to the relative risks and benefits of all options for the treatment of severe aortic stenosis including long term medical therapy, conventional surgery for aortic valve replacement, and transcatheter aortic valve replacement.  The patient has been independently evaluated in formal cardiac surgical consultation by Dr Cyndia Bent and Dr Lavonna Monarch, who deemed the patient appropriate for TAVR. Based upon review  of all of the patient's preoperative diagnostic tests they are felt to be candidate for transcatheter aortic valve replacement using the transfemoral approach as an alternative to conventional surgery.    Following the decision to proceed with transcatheter aortic valve replacement, a discussion has been held regarding what types of management strategies would be attempted intraoperatively in the event of life-threatening complications, including whether or not the patient would be considered a candidate for the use of cardiopulmonary bypass and/or conversion to open sternotomy for attempted surgical intervention.  The patient has been advised of a variety of complications that might develop peculiar to this approach including but not limited to risks of death, stroke, paravalvular leak, aortic dissection or other major vascular complications, aortic annulus rupture, device embolization, cardiac rupture or perforation, acute myocardial infarction, arrhythmia, heart block or bradycardia requiring permanent pacemaker placement, congestive heart failure, respiratory failure, renal failure, pneumonia, infection, other late complications related to structural valve deterioration or migration, or other complications that might ultimately cause a temporary or permanent loss of functional independence or other long term morbidity.  The patient provides full informed consent for the procedure as described and all questions were answered preoperatively.  DETAILS OF THE OPERATIVE PROCEDURE  PREPARATION:   The patient is brought to the operating room on the above mentioned date and central monitoring was established by the anesthesia team including placement of a radial arterial line. The patient is placed in the supine position on the operating table.  Intravenous antibiotics are administered. The patient is monitored closely throughout the procedure under conscious sedation.  Baseline transthoracic echocardiogram is  performed. The patient's chest, abdomen, both groins, and both lower extremities are prepared and draped in a sterile manner. A time out procedure is performed.   PERIPHERAL ACCESS:  Using ultrasound guidance, femoral arterial and venous access is obtained with placement of 6 Fr sheaths on the left side.  [place] images are digitally captured and stored in the patient's chart. A pigtail diagnostic catheter was passed through the femoral arterial sheath under fluoroscopic guidance into the aortic root.  A temporary transvenous pacemaker catheter was passed through the femoral venous sheath under fluoroscopic guidance into the right ventricle.  The pacemaker was tested to ensure stable lead placement and pacemaker capture. Aortic root angiography was performed in order to determine the optimal angiographic angle for valve deployment.  TRANSFEMORAL ACCESS:  A micropuncture technique is used to access the right femoral artery under fluoroscopic and ultrasound guidance.  2 Perclose devices are deployed at 10' and 2' positions to 'PreClose' the femoral artery. An 8 French sheath is placed and then an Amplatz Superstiff wire is advanced through the sheath. This is changed out for a 16 French transfemoral E-Sheath after progressively dilating over the Superstiff wire.  An AL-2 catheter was used to direct a straight-tip exchange length wire across the native aortic valve into the left ventricle. This was exchanged out for a pigtail catheter and position was confirmed in the LV apex. Simultaneous LV and Ao pressures were recorded.  The pigtail catheter was exchanged for a Safari wire in the LV apex.    BALLOON AORTIC VALVULOPLASTY:  Not performed  TRANSCATHETER HEART VALVE DEPLOYMENT:  An Edwards Sapien 3 Ultra Resilia transcatheter heart valve (size 29 mm) was prepared and crimped per manufacturer's guidelines, and the proper orientation of the valve is confirmed on the [employer] delivery system. The valve  was advanced through the introducer sheath using normal technique until in an appropriate position in the abdominal aorta beyond the sheath tip. The balloon was then retracted and using the fine-tuning wheel was centered on the valve. The valve was then advanced across the aortic arch using appropriate flexion of the catheter. The valve was carefully positioned across the aortic valve annulus. The Commander catheter was retracted using normal technique. Once final position of the valve has been confirmed by angiographic assessment, the valve is deployed while temporarily holding ventilation and during rapid ventricular pacing to maintain systolic blood pressure < 50 mmHg and pulse pressure < 10 mmHg. The balloon inflation is held for >3 seconds after reaching full deployment volume. Once the balloon has fully deflated the balloon is retracted into the ascending aorta and valve function is assessed using echocardiography. The patient's hemodynamic recovery following valve deployment is good.  The deployment balloon and guidewire are both removed. Echo demostrated acceptable post-procedural gradients, stable mitral valve function, and no aortic insufficiency.    PROCEDURE COMPLETION:  The sheath was removed and femoral artery closure is performed using the 2 previously deployed Perclose devices.  Protamine is administered once femoral arterial repair was complete. The site is clear with no evidence of bleeding or hematoma after the sutures are tightened. The temporary pacemaker and pigtail catheters are removed. Mynx closure is used for contralateral femoral arterial hemostasis for the 6 Fr sheath.  The patient tolerated the procedure well and is transported to the recovery area in stable condition. There were no immediate intraoperative complications. All sponge instrument and needle counts are verified correct at completion of the operation.   The patient received a total of 40 mL of intravenous contrast  during the procedure.  EBL: minimal   Sherren Mocha, MD 06/09/2022 3:51 PM

## 2022-06-09 NOTE — Op Note (Signed)
[facility]   MULTIDISCIPLINARY HEART VALVE TEAM   TAVR OPERATIVE NOTE   Date of Procedure:  06/09/2022  Preoperative Diagnosis: Severe Aortic Stenosis   Postoperative Diagnosis: Same   Procedure:   Transcatheter Aortic Valve Replacement - Percutaneous right Transfemoral Approach  Edwards Sapien 3 Ultra THV (size 29 mm, model # 9755RSL, serial # [id number])   Co-Surgeons:  [name] MD and Sherren Mocha, MD Anesthesiologist:  Janeece Riggers MD  Echocardiographer:  Dr Owens Shark  Pre-operative Echo Findings: Severe aortic stenosis depressed left ventricular systolic function  Post-operative Echo Findings: no paravalvular leak Depressed left ventricular systolic function   BRIEF CLINICAL NOTE AND INDICATIONS FOR SURGERY  Pt with NYHA class 4 symptoms of severe AS with depressed LV function and with associated afib, MR, TR and serial infections who has been medically optimized prior to intervention.     DETAILS OF THE OPERATIVE PROCEDURE  PREPARATION:    The patient was brought to the operating room on the above mentioned date and appropriate monitoring was established by the anesthesia team. The patient was placed in the supine position on the operating table.  Intravenous antibiotics were administered. The patient was monitored closely throughout the procedure under conscious sedation.  Baseline transthoracic echocardiogram was performed. The patient's abdomen and both groins were prepped and draped in a sterile manner. A time out procedure was performed.   PERIPHERAL ACCESS:    Using the modified Seldinger technique, femoral arterial and venous access was obtained with placement of 6 Fr sheaths on the left side.  A pigtail diagnostic catheter was passed through the left arterial sheath under fluoroscopic guidance into the aortic root.  A temporary transvenous pacemaker catheter was passed through the left femoral venous sheath under fluoroscopic  guidance into the right ventricle.  The pacemaker was tested to ensure stable lead placement and pacemaker capture. Aortic root angiography was performed in order to determine the optimal angiographic angle for valve deployment.   TRANSFEMORAL ACCESS:   Percutaneous transfemoral access and sheath placement was performed using ultrasound guidance.  The right common femoral artery was cannulated using a micropuncture needle and appropriate location was verified using hand injection angiogram.  A pair of Abbott Perclose percutaneous closure devices were placed and a 6 French sheath replaced into the femoral artery.  The patient was heparinized systemically and ACT verified > 250 seconds.    A 16 Fr transfemoral E-sheath was introduced into the rigth common femoral artery after progressively dilating over an Amplatz superstiff wire. An AL2 catheter was used to direct a straight-tip exchange length wire across the native aortic valve into the left ventricle. This was exchanged out for a pigtail catheter and position was confirmed in the LV apex.   The pigtail catheter was exchanged for a Safari wire in the LV apex.   BALLOON AORTIC VALVULOPLASTY:   Was not done   TRANSCATHETER HEART VALVE DEPLOYMENT:   An Edwards Sapien 3 Ultra transcatheter heart valve (size 29 mm) was prepared and crimped per manufacturer's guidelines, and the proper orientation of the valve is confirmed on the [employer] delivery system. The valve was advanced through the introducer sheath using normal technique until in an appropriate position in the abdominal aorta beyond the sheath tip. The balloon was then retracted and using the fine-tuning wheel was centered on the valve. The valve was then advanced across the aortic arch using appropriate flexion of the catheter. The valve was carefully positioned across the aortic valve annulus. The [employer]  catheter was retracted using normal technique. Once final position of the valve  has been confirmed by angiographic assessment, the valve is deployed during rapid ventricular pacing to maintain systolic blood pressure < 50 mmHg and pulse pressure < 10 mmHg. The balloon inflation is held for >3 seconds after reaching full deployment volume. Once the balloon has fully deflated the balloon is retracted into the ascending aorta and valve function is assessed using echocardiography. There is felt to be no paravalvular leak and no central aortic insufficiency.  The patient's hemodynamic recovery following valve deployment is good.  The deployment balloon and guidewire are both removed.    PROCEDURE COMPLETION:   The sheath was removed and femoral artery closure performed.  Protamine was administered once femoral arterial repair was complete. The temporary pacemaker, pigtail catheter and femoral sheaths were removed with manual pressure used for venous hemostasis.  A Mynx femoral closure device was utilized following removal of the diagnostic sheath in the left femoral artery.  The patient tolerated the procedure well and is transported to the cath lab recovery area in stable condition. There were no immediate intraoperative complications. All sponge instrument and needle counts are verified correct at completion of the operation.   No blood products were administered during the operation.  The patient received a total of 40 mL of intravenous contrast during the procedure.   [name], MD 06/09/2022 2:25 PM

## 2022-06-09 NOTE — Progress Notes (Signed)
Advanced Heart Failure Rounding Note   Subjective:   Taken to cath lab 3/1 for RHC. Found to be in cardiogenic shock. [place] 06/03/22: RA 17 PA 72/41 (50) PCWP 30 (v=45) FICK CI 2.1 Thermo 1.6. Started on milrinone and IV lasix  Echo severe calcific aortic stenosis and severe central mitral regurgitation. EF 45-50%  Co-ox remains stable off milrinone, 74% today   1.1L in UOP yesterday, now on PO lasix. Wt stable. CVP 5/6   Scr continues to improve 3.5 -> 2.1-> 1.9-> 1.45 ->1.39-> 1.23  Frequent runs NSVT / frequent PVCs. K 3.3  Feels good today. Able to ambulate with no SOB. Wife coming later this morning. Plan for TAVR around noon.   Objective:   Weight Range:  Vital Signs:   Temp:  [97.9 F (36.6 C)-98.8 F (37.1 C)] 98.6 F (37 C) (03/07 0749) Pulse Rate:  [89-109] 89 (03/07 0749) Resp:  [16-22] 18 (03/07 0749) BP: (127-161)/(90-129) 157/117 (03/07 0749) SpO2:  [96 %-98 %] 97 % (03/07 0531) Weight:  [73.5 kg] 73.5 kg (03/07 0531) Last BM Date : 06/08/22  Weight change: Filed Weights   06/07/22 0518 06/08/22 0416 06/09/22 0531  Weight: 75.7 kg 74.3 kg 73.5 kg   Intake/Output:   Intake/Output Summary (Last 24 hours) at 06/09/2022 0800 Last data filed at 06/09/2022 0532 Gross per 24 hour  Intake 950.17 ml  Output 1100 ml  Net -149.83 ml    Physical Exam: CVP 5/6 General:  well appearing.  No respiratory difficulty HEENT: normal Neck: supple. JVD 6 cm. Carotids 2+ bilat; no bruits. No lymphadenopathy or thyromegaly appreciated. CVC R IJ Cor: PMI nondisplaced. Regular rate & irregular rhythm. No rubs, gallops or murmurs. Lungs: clear Abdomen: soft, nontender, nondistended. No hepatosplenomegaly. No bruits or masses. Good bowel sounds. Extremities: no cyanosis, clubbing, rash, edema  Neuro: alert & oriented x 3, cranial nerves grossly intact. moves all 4 extremities w/o difficulty. Affect pleasant.   Telemetry: AF low 100s,  burst NSVT longest 13 beats (Personally  reviewed)    Labs: Basic Metabolic Panel: Recent Labs  Lab 06/04/22 1930 06/05/22 0500 06/06/22 0415 06/07/22 0124 06/08/22 0520 06/09/22 0500  NA 139 137 135 135 138 138  K 3.7 4.2 4.2 4.3 4.0 3.3*  CL 100 100 97* 102 104 104  CO2 '27 25 31 24 22 25  '$ GLUCOSE 92 86 97 102* 92 88  BUN 43* 39* 38* 35* 28* 21  CREATININE 2.31* 2.11* 1.96* 1.45* 1.39* 1.23  CALCIUM 8.6* 8.4* 8.6* 8.7* 8.8* 8.8*  MG 1.8 2.1 1.8 1.9 1.8 2.2  PHOS 2.6  --   --   --   --   --    Liver Function Tests: Recent Labs  Lab 06/04/22 0347 06/04/22 1930  AST 19 21  ALT 15 21  ALKPHOS 104 123  BILITOT 1.2 1.0  PROT 6.0* 5.9*  ALBUMIN 3.1* 2.8*   No results for input(s): "LIPASE", "AMYLASE" in the last 168 hours.  No results for input(s): "AMMONIA" in the last 168 hours.  CBC: Recent Labs  Lab 06/03/22 0020 06/03/22 1551 06/04/22 0347 06/05/22 0500 06/09/22 0500  WBC 4.8  --  3.5* 4.0 3.8*  NEUTROABS 2.3  --  1.6* 2.0  --   HGB 11.7* 13.3  13.3 10.3* 11.5* 10.6*  HCT 34.6* 39.0  39.0 30.9* 34.6* 32.7*  MCV 74.7*  --  74.6* 75.2* 76.0*  PLT 134*  --  126* 143* 143*   Cardiac Enzymes: No  results for input(s): "CKTOTAL", "CKMB", "CKMBINDEX", "TROPONINI" in the last 168 hours.  BNP: BNP (last 3 results) Recent Labs    05/31/22 1648 06/01/22 1629 06/02/22 0554  BNP 917.1* 670.6* 525.7*   Other results:  Imaging: DG Chest 2 View  Result Date: 06/08/2022 CLINICAL DATA:  Preop EXAM: CHEST - 2 VIEW COMPARISON:  X-[place] 06/02/2022 FINDINGS: Right IJ line in place with tip along the central SVC right atrial junction region. No pneumothorax. Enlarged cardiopericardial silhouette. No pneumothorax. Tiny bilateral pleural effusions. No edema. Overlapping cardiac leads. IMPRESSION: Tiny pleural effusions. Right IJ catheter.  No pneumothorax Electronically Signed   By: Jill Side M.D.   On: 06/08/2022 18:32    Medications:   Scheduled Medications:  atorvastatin  20 mg Oral Daily    chlorhexidine  1 Application Topical Once   chlorhexidine  15 mL Mouth/Throat Once   Chlorhexidine Gluconate Cloth  6 each Topical Daily   furosemide  40 mg Oral Daily   hydrALAZINE  50 mg Oral Q8H   losartan  25 mg Oral Daily   magnesium sulfate  40 mEq Other To OR   potassium chloride  80 mEq Other To OR   sodium chloride flush  3 mL Intravenous Q12H   Infusions:  sodium chloride Stopped (06/08/22 1422)   amiodarone 30 mg/hr (06/09/22 0032)    ceFAZolin (ANCEF) IV     dexmedetomidine     heparin 30,000 units/NS 1000 mL solution for CELLSAVER     heparin 1,050 Units/hr (06/08/22 1848)   norepinephrine (LEVOPHED) Adult infusion     PRN Medications: sodium chloride, acetaminophen, hydrALAZINE, ondansetron (ZOFRAN) IV, mouth rinse, temazepam  Assessment/Plan:  1. A/c systolic HF -> cardiogenic shock - Echo 3/1/24LV EF 40-45% severe AS severe MR - Echo 11/23 EF 55% severe AS mild MR - Cath 3/23: LAD apical 90% D2 90% LCx 50%d RCA 40%p (Medical rx) - Admitted with marked volume overload. Diuresis complicated worsening renal function. CO2 16.  - Suspect shock due to HF and not sepsis - RHC 06/03/22: RA 17 PA 72/41 (50) PCWP 30 (v=45) FICK CI 2.1 Thermo 1.6. Started on milrinone and IV lasix - Co-ox 74%, stable. Now off milrinone 3/4 - Continue hydralazine 50 TID  - Continue losartan 25 mg daily as BP markedly elevated, plan to transition to entresto later today after TAVR - Now on 40 PO lasix daily - Discussed with structural team. Plan for TAVR today   2. AS - Severe AS  - Has seen [name] as outpatient for TAVR (had been on hold due to recent septic arthritis)  - As above, inpatient structural team following. TAVR today   3. MR  - Mod-Severe  - suspect exacerbated by volume overload and HTN - Can re-evaluate after optimization.  - Likely also to improve with TAVR  4. PVCs/NSVT - Continue IV amio - having frequent runs this am, will supplement K - Keep K> 4.0. Mg >  2.0  5. Chronic A fib. Now with RVR   - Rate elevated after b-blocker stopped and milrinone started - Continue IV amio - Eliquis 5 mg twice a day on hold. Now on hep gtt - Can consider DCCV once TAVR complete.   6. CAD  - primarily non-obstructive with distal/branch vessel disease - no angina - continue medical management  7. AKI on CKD 3b due to ATN/shock - Baseline Scr 1.4-1.5  - Peak Scr 3.5 -> 2.8 -> 2.1-> 1.96->1.45->1.39-> 1.23 with inotropic support and diuresis  8. H/O Septic Knee 01/2022  - Coag negative staph. Completed daptomycin x 4 weeks then switched to linezolid 02/20/22  9. Hypokalemia/hypomagnesemia - supp Mg 1.8 3/6. Today 2.2 - K 3.3 today, will supplement    10. UTI - UA large amount of leukocytes.  - Off farxiga would not rechallenge.  - On cephalexin per primary  Length of Stay: [place] AGACNP-BC 06/09/2022, 8:00 AM  Advanced Heart Failure Team Pager [phone] (M-F; 7a - 4p)  Please contact [facility] for night-coverage after hours (4p -7a ) and weekends on [email or web address]

## 2022-06-09 NOTE — Anesthesia Procedure Notes (Signed)
Arterial Line Insertion Start/End3/10/2022 12:30 PM Performed by: Alain Marion, CRNA, CRNA  Patient location: Pre-op. Preanesthetic checklist: patient identified, IV checked, site marked, risks and benefits discussed, surgical consent, monitors and equipment checked, pre-op evaluation, timeout performed and anesthesia consent Lidocaine 1% used for infiltration Right, radial was placed Catheter size: 20 G Hand hygiene performed  and maximum sterile barriers used   Attempts: 1 Procedure performed without using ultrasound guided technique. Following insertion, dressing applied and Biopatch. Post procedure assessment: normal and unchanged  Patient tolerated the procedure well with no immediate complications.

## 2022-06-10 ENCOUNTER — Other Ambulatory Visit (HOSPITAL_COMMUNITY): Payer: Self-pay

## 2022-06-10 ENCOUNTER — Encounter (HOSPITAL_COMMUNITY): Payer: Self-pay | Admitting: Cardiovascular Disease

## 2022-06-10 ENCOUNTER — Inpatient Hospital Stay (HOSPITAL_COMMUNITY): Payer: 59

## 2022-06-10 DIAGNOSIS — G9341 Metabolic encephalopathy: Secondary | ICD-10-CM | POA: Diagnosis not present

## 2022-06-10 DIAGNOSIS — Z952 Presence of prosthetic heart valve: Secondary | ICD-10-CM

## 2022-06-10 DIAGNOSIS — Z1152 Encounter for screening for COVID-19: Secondary | ICD-10-CM | POA: Diagnosis not present

## 2022-06-10 DIAGNOSIS — A4150 Gram-negative sepsis, unspecified: Secondary | ICD-10-CM | POA: Diagnosis not present

## 2022-06-10 DIAGNOSIS — Z006 Encounter for examination for normal comparison and control in clinical research program: Secondary | ICD-10-CM | POA: Diagnosis not present

## 2022-06-10 DIAGNOSIS — I480 Paroxysmal atrial fibrillation: Secondary | ICD-10-CM

## 2022-06-10 LAB — BASIC METABOLIC PANEL
Anion gap: 9 (ref 5–15)
Anion gap: 6 (ref 5–15)
BUN: 23 mg/dL (ref 8–23)
BUN: 20 mg/dL (ref 8–23)
CO2: 23 mmol/L (ref 22–32)
CO2: 25 mmol/L (ref 22–32)
Calcium: 8.8 mg/dL — ABNORMAL LOW (ref 8.9–10.3)
Calcium: 8.6 mg/dL — ABNORMAL LOW (ref 8.9–10.3)
Chloride: 106 mmol/L (ref 98–111)
Chloride: 104 mmol/L (ref 98–111)
Creatinine, Ser: 1.35 mg/dL — ABNORMAL HIGH (ref 0.61–1.24)
Creatinine, Ser: 1.42 mg/dL — ABNORMAL HIGH (ref 0.61–1.24)
GFR, Estimated: 59 mL/min — ABNORMAL LOW (ref >60)
GFR, Estimated: 56 mL/min — ABNORMAL LOW (ref >60)
Glucose, Bld: 96 mg/dL (ref 70–99)
Glucose, Bld: 157 mg/dL — ABNORMAL HIGH (ref 70–99)
Potassium: 4.5 mmol/L (ref 3.5–5.1)
Potassium: 3.5 mmol/L (ref 3.5–5.1)
Sodium: 138 mmol/L (ref 135–145)
Sodium: 135 mmol/L (ref 135–145)

## 2022-06-10 LAB — CBC
HCT: 32 % — ABNORMAL LOW (ref 39.0–52.0)
Hemoglobin: 10.6 g/dL — ABNORMAL LOW (ref 13.0–17.0)
MCH: 25.1 pg — ABNORMAL LOW (ref 26.0–34.0)
MCHC: 33.1 g/dL (ref 30.0–36.0)
MCV: 75.8 fL — ABNORMAL LOW (ref 80.0–100.0)
Platelets: 127 10*3/uL — ABNORMAL LOW (ref 150–400)
RBC: 4.22 MIL/uL (ref 4.22–5.81)
RDW: 20.8 % — ABNORMAL HIGH (ref 11.5–15.5)
WBC: 5.1 10*3/uL (ref 4.0–10.5)
nRBC: 0 % (ref 0.0–0.2)

## 2022-06-10 LAB — COOXEMETRY PANEL
Carboxyhemoglobin: 3.3 % — ABNORMAL HIGH (ref 0.5–1.5)
Methemoglobin: 0.9 % (ref 0.0–1.5)
O2 Saturation: 71.4 %
Total hemoglobin: 10.8 g/dL — ABNORMAL LOW (ref 12.0–16.0)

## 2022-06-10 LAB — APTT: aPTT: 35 seconds (ref 24–36)

## 2022-06-10 LAB — MAGNESIUM: Magnesium: 1.8 mg/dL (ref 1.7–2.4)

## 2022-06-10 MED ORDER — MAGNESIUM SULFATE 2 GM/50ML IV SOLN
2.0000 g | Freq: Once | INTRAVENOUS | Status: AC
  Administered 2022-06-10: 2 g via INTRAVENOUS
  Filled 2022-06-10: qty 50

## 2022-06-10 MED ORDER — AMIODARONE HCL 200 MG PO TABS
200.0000 mg | ORAL_TABLET | Freq: Two times a day (BID) | ORAL | Status: DC
  Administered 2022-06-10 – 2022-06-11 (×2): 200 mg via ORAL
  Filled 2022-06-10 (×2): qty 1

## 2022-06-10 MED ORDER — CARVEDILOL 3.125 MG PO TABS
3.1250 mg | ORAL_TABLET | Freq: Two times a day (BID) | ORAL | 0 refills | Status: DC
  Filled 2022-06-10 (×2): qty 60, 30d supply, fill #0

## 2022-06-10 MED ORDER — SPIRONOLACTONE 12.5 MG HALF TABLET
12.5000 mg | ORAL_TABLET | Freq: Every day | ORAL | Status: DC
  Administered 2022-06-10 – 2022-06-13 (×4): 12.5 mg via ORAL
  Filled 2022-06-10 (×4): qty 1

## 2022-06-10 MED ORDER — SPIRONOLACTONE 25 MG PO TABS
12.5000 mg | ORAL_TABLET | Freq: Every day | ORAL | 0 refills | Status: DC
  Filled 2022-06-10 (×2): qty 15, 30d supply, fill #0

## 2022-06-10 MED ORDER — SACUBITRIL-VALSARTAN 49-51 MG PO TABS
1.0000 | ORAL_TABLET | Freq: Two times a day (BID) | ORAL | Status: DC
  Administered 2022-06-10 – 2022-06-11 (×3): 1 via ORAL
  Filled 2022-06-10 (×3): qty 1

## 2022-06-10 MED ORDER — SACUBITRIL-VALSARTAN 49-51 MG PO TABS
1.0000 | ORAL_TABLET | Freq: Two times a day (BID) | ORAL | 0 refills | Status: DC
  Filled 2022-06-10 (×2): qty 60, 30d supply, fill #0

## 2022-06-10 MED ORDER — HYDRALAZINE HCL 25 MG PO TABS: 25.0000 mg | ORAL_TABLET | Freq: Three times a day (TID) | ORAL | Status: DC

## 2022-06-10 MED ORDER — CARVEDILOL 3.125 MG PO TABS
3.1250 mg | ORAL_TABLET | Freq: Two times a day (BID) | ORAL | Status: DC
  Administered 2022-06-10 – 2022-06-13 (×6): 3.125 mg via ORAL
  Filled 2022-06-10 (×6): qty 1

## 2022-06-10 MED FILL — Heparin Sodium (Porcine) Inj 1000 Unit/ML: INTRAMUSCULAR | Qty: 11 | Status: AC

## 2022-06-10 NOTE — TOC Benefit Eligibility Note (Signed)
Patient Teacher, English as a foreign language completed.    The patient is currently admitted and upon discharge could be taking Entresto 24-26 mg.  The current 30 day co-pay is $0.00.   The patient is insured through [employer] [place] Patient Advocate Specialist [place] Patient Advocate Team Direct Number: [phone]  Fax: [phone]

## 2022-06-10 NOTE — Plan of Care (Signed)
  Problem: Education: Goal: Ability to demonstrate management of disease process will improve Outcome: Progressing   Problem: Activity: Goal: Capacity to carry out activities will improve Outcome: Progressing   Problem: Nutrition: Goal: Adequate nutrition will be maintained Outcome: Progressing   

## 2022-06-10 NOTE — Progress Notes (Signed)
Progress Note   Patient: Faysal Bourret [id number] DOB: 1960-06-07 DOA: 06/01/2022     8 DOS: the patient was seen and examined on 06/10/2022   Brief hospital course: Mr. Somero was admitted to the hospital with the working diagnosis of heart failure.   62 yo male with the past medical history of aortic stenosis, coronary artery disease, CVA, CKD, and hx of right knee septic arthritis who presented with dyspnea. Patient has been off his medications for several months. He developed worsening dyspnea, lower extremity edema, chest and abdominal pain that prompted him to come to the hospital. On his initial physical examination his blood pressure was 83/69, HR 59, RR 30 and 02 saturation 94%, lungs with no wheezing or rales, heart with S1 and S2 present and rhythmic, abdomen with no distention, positive lower extremity edema.   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  Patient was placed on antibiotic therapy and diuretics.  -3/1, worsening kidney function, echo noted worsening systolic function with severe MR, CHF team consulted -Right heart cath 3/1, markedly elevated filling pressures, low cardiac output, started on Lasix gtt. and milrinone -3/2 night-Lasix GTT discontinued -3/3: Started Amio gtt., restarted milrinone -3/4, milrinone discontinued 3/5: Structural heart team consulting  03/07 TAVR  03/08 clinically stable, continue on IV amiodarone.   Assessment and Plan: * Sepsis secondary to UTI ([place]) Klebsiella urine infection, severe sepsis present on admission.  Patient has been responding well to antibiotic therapy, he had Ceftriaxone IV and now transitioned to oral cephalexin, plan to complete 10 days total.   Acute on chronic systolic CHF (congestive heart failure) (HCC) Echocardiogram with reduced Lv systolic function 45 to [id number] with severe concentric hypertrophy, RV systolic function preserved, LA  and RA with severe dilatation, severe mitral valve regurgitation, severe low flow aortic valve stenosis.   Urine output documented at [id number] Systolic blood pressure is 130 to 140 with   On entresto and spironolactone.  Holding loop diuretic for now.     Acute renal failure superimposed on chronic kidney disease (HCC) CKD stage 3a. Hypokalemia.   Renal function with serum cr at 1,42 with K at 3,5 and serum bicarbonate at 25. Na 135  Avoid hypotension and nephrotoxic medications.  Follow up renal function in am.   Anemia Cell count has been stable with hgb at  11.5   HTN (hypertension) Patient on entresto and spironolactone for blood pressure control.   CAD (coronary artery disease) No  chest pain   Atrial fibrillation (HCC) Continue rate control with amiodarone IV Anticoagulation with apixaban        Subjective: Patient with no chest pain or dyspnea, he has been ambulating with improvement in functional status,  Physical Exam: Vitals:   06/10/22 1238 06/10/22 1400 06/10/22 1600 06/10/22 1700  BP: (!) 138/93 (!) 142/98 (!) 135/92 (!) 142/87  Pulse: 74 83 81 77  Resp: 20 (!) '22 18 20  '$ Temp: 98.1 F (36.7 C)     TempSrc: Oral   Oral  SpO2: 97% 97% 97% 98%  Weight:      Height:       Neurology awake and alert ENT with no pallor Cardiovascular with S1 and S2 present and regular with no significant murmur, no gallops or rubs No JVD No lower extremity edema Respiratory with no rales or wheezing Abdomen with no distention  Data Reviewed:    Family Communication: family at the bedside  Disposition: Status is: Inpatient Remains inpatient appropriate because: post op care TAVR  Planned Discharge Destination: Home    Author: Tawni Millers, MD 06/10/2022 5:12 PM  For on call review [email or web address].

## 2022-06-10 NOTE — Progress Notes (Signed)
Echocardiogram 2D Echocardiogram has been performed.  Ronny Flurry 06/10/2022, 5:39 PM

## 2022-06-10 NOTE — Progress Notes (Signed)
Physical Therapy Treatment Patient Details Name: [name] MRN: [id number] DOB: 05-Mar-1961 Today's Date: 06/10/2022   History of Present Illness 62 y.o. male presents to [facility] hospital on 06/01/2022 with complaints of SOB, BLE edema, chest and abdominal pain. Pt admitted for management of sepsis 2/2 UTI and emphysematous cystitis. Pt underwent right heart cath on 3/1, found to be in cardiogenic shock. Pt also with severe aortic stenosis. s/p TAVR 06/09/22. PMH includes HTN, HLD, CHF, OSA, afib, gout, GERD.    PT Comments    Patient progressing slowly towards PT goals. Reports being upset as he did not sleep much last night due to all the loud beeping noises. Requires more assist this date with mobility, needing Min A for gait training holding onto IV pole for support. Reports "wooziness." Encouraged importance of mobility to improve strength while in the hospital. Might need RW pending improvement and increased activity. Mobility tech to follow this weekend. Will follow acutely.    Recommendations for follow up therapy are one component of a multi-disciplinary discharge planning process, led by the attending physician.  Recommendations may be updated based on patient status, additional functional criteria and insurance authorization.  Follow Up Recommendations  Home health PT (pending improvement)     Assistance Recommended at Discharge PRN  Patient can return home with the following A little help with walking and/or transfers;A little help with bathing/dressing/bathroom;Help with stairs or ramp for entrance;Assist for transportation;Assistance with cooking/housework   Equipment Recommendations  None recommended by PT    Recommendations for Other Services       Precautions / Restrictions Precautions Precautions: Fall Restrictions Weight Bearing Restrictions: No     Mobility  Bed Mobility Overal bed mobility: Needs Assistance Bed Mobility: Sit to Supine       Sit to supine:  Supervision   General bed mobility comments: Sitting EOB upon PT arrival.    Transfers Overall transfer level: Needs assistance Equipment used: None Transfers: Sit to/from Stand Sit to Stand: Min guard, From elevated surface           General transfer comment: Min guard for safety. Use of momentum and a few attempts needed to stand requiring elevated bed height. + wooziness.    Ambulation/Gait Ambulation/Gait assistance: Min assist, Min guard Gait Distance (Feet): 100 Feet Assistive device: IV Pole Gait Pattern/deviations: Step-through pattern, Decreased stride length, Trunk flexed Gait velocity: reduced Gait velocity interpretation: <1.8 ft/sec, indicate of risk for recurrent falls   General Gait Details: Slow, unsteady gait wtih forward momentum at times losing balance x1 needing Min A to correct, shuffling steps. Holding onto IV pole for support. Feeling Woozy per report.   Stairs             Wheelchair Mobility    Modified Rankin (Stroke Patients Only)       Balance Overall balance assessment: Needs assistance Sitting-balance support: Feet supported, No upper extremity supported Sitting balance-Leahy Scale: Good     Standing balance support: During functional activity Standing balance-Leahy Scale: Poor Standing balance comment: Able to stand statically but needs UE support for walking                            Cognition Arousal/Alertness: Awake/alert Behavior During Therapy: WFL for tasks assessed/performed Overall Cognitive Status: Within Functional Limits for tasks assessed  General Comments: needs reminders about importance of mobility        Exercises      General Comments General comments (skin integrity, edema, etc.): Wife present during session. VSS on RA.      Pertinent Vitals/Pain Pain Assessment Pain Assessment: No/denies pain    Home Living                           Prior Function            PT Goals (current goals can now be found in the care plan section) Progress towards PT goals: Progressing toward goals (slowly)    Frequency    Min 2X/week      PT Plan Current plan remains appropriate;Discharge plan needs to be updated    Co-evaluation              AM-PAC PT "6 Clicks" Mobility   Outcome Measure  Help needed turning from your back to your side while in a flat bed without using bedrails?: None Help needed moving from lying on your back to sitting on the side of a flat bed without using bedrails?: None Help needed moving to and from a bed to a chair (including a wheelchair)?: A Little Help needed standing up from a chair using your arms (e.g., wheelchair or bedside chair)?: A Little Help needed to walk in hospital room?: A Little Help needed climbing 3-5 steps with a railing? : A Little 6 Click Score: 20    End of Session Equipment Utilized During Treatment: Gait belt Activity Tolerance: Patient limited by fatigue Patient left: in bed;with call bell/phone within reach;with family/visitor present Nurse Communication: Mobility status PT Visit Diagnosis: Other abnormalities of gait and mobility (R26.89)     Time: 1310-1330 PT Time Calculation (min) (ACUTE ONLY): 20 min  Charges:  $Gait Training: 8-22 mins                     Marisa Severin, PT, DPT Acute Rehabilitation Services Secure chat preferred Office [place] 06/10/2022, 2:35 PM

## 2022-06-10 NOTE — Progress Notes (Addendum)
Advanced Heart Failure Rounding Note   Subjective:   Taken to cath lab 3/1 for RHC. Found to be in cardiogenic shock. [place] 06/03/22: RA 17 PA 72/41 (50) PCWP 30 (v=45) FICK CI 2.1 Thermo 1.6. Started on milrinone and IV lasix  Echo severe calcific aortic stenosis and severe central mitral regurgitation. EF 45-50%  Co-ox remains stable off milrinone, 71% today   Minimal UOP recorded ON, Patient did not receive lasix yesterday. Wt up 3lbs.    Scr up this AM 3.5 -> 2.1-> 1.9-> 1.45 ->1.39-> 1.23> 1.10>1.35    Feels anxious. Not able to sleep overnight and not much of an appetite.    Objective:   Weight Range:  Vital Signs:   Temp:  [97.8 F (36.6 C)-98.5 F (36.9 C)] 98.5 F (36.9 C) (03/08 0824) Pulse Rate:  [50-182] 98 (03/08 0824) Resp:  [0-37] 20 (03/08 0824) BP: (78-174)/(60-123) 119/95 (03/08 0824) SpO2:  [91 %-100 %] 97 % (03/08 0824) Weight:  [73.9 kg-75.5 kg] 75.5 kg (03/08 0610) Last BM Date : 06/08/22  Weight change: Filed Weights   06/09/22 0531 06/09/22 1110 06/10/22 0610  Weight: 73.5 kg 73.9 kg 75.5 kg   Intake/Output:   Intake/Output Summary (Last 24 hours) at 06/10/2022 1003 Last data filed at 06/10/2022 0931 Gross per 24 hour  Intake 2140.04 ml  Output 270 ml  Net 1870.04 ml      Physical Exam  Constitutional: Well-appearing in no distress.  Neck: JVP 8cm  Cardiovascular: Irregularly irregular, intact distal pulses. No gallop and no friction rub.  No murmur heard. No lower extremity edema  Pulmonary: Non labored breathing on room air, no wheezing or rales  Abdominal: Soft. Normal bowel sounds. Non distended and non tender Extremities: no cyanosis, clubbing, rash, edema. Insertion sites in inguinal region, w/ no ecchymosis, edema, or erythema Neurological: Alert and oriented to person, place, and time. Non focal  Skin: Skin is warm and dry.   Telemetry: AF 70s to 100s, few PVCs  Labs: Basic Metabolic Panel: Recent Labs  Lab 06/04/22 1930  06/05/22 0500 06/06/22 0415 06/07/22 0124 06/08/22 0520 06/09/22 0500 06/09/22 1313 06/09/22 1515 06/10/22 0445  NA 139   < > 135 135 138 138 140 140 138  K 3.7   < > 4.2 4.3 4.0 3.3* 3.2* 3.3* 4.5  CL 100   < > 97* 102 104 104 104 104 106  CO2 27   < > '31 24 22 25  '$ --   --  23  GLUCOSE 92   < > 97 102* 92 88 90 103* 96  BUN 43*   < > 38* 35* 28* '21 18 19 23  '$ CREATININE 2.31*   < > 1.96* 1.45* 1.39* 1.23 1.10 1.10 1.35*  CALCIUM 8.6*   < > 8.6* 8.7* 8.8* 8.8*  --   --  8.8*  MG 1.8   < > 1.8 1.9 1.8 2.2  --   --  1.8  PHOS 2.6  --   --   --   --   --   --   --   --    < > = values in this interval not displayed.    Liver Function Tests: Recent Labs  Lab 06/04/22 0347 06/04/22 1930  AST 19 21  ALT 15 21  ALKPHOS 104 123  BILITOT 1.2 1.0  PROT 6.0* 5.9*  ALBUMIN 3.1* 2.8*    CBC: Recent Labs  Lab 06/04/22 0347 06/05/22 0500 06/09/22 0500 06/09/22  1313 06/09/22 1515 06/10/22 0445  WBC 3.5* 4.0 3.8*  --   --  5.1  NEUTROABS 1.6* 2.0  --   --   --   --   HGB 10.3* 11.5* 10.6* 12.9* 11.9* 10.6*  HCT 30.9* 34.6* 32.7* 38.0* 35.0* 32.0*  MCV 74.6* 75.2* 76.0*  --   --  75.8*  PLT 126* 143* 143*  --   --  127*    BNP: BNP (last 3 results) Recent Labs    05/31/22 1648 06/01/22 1629 06/02/22 0554  BNP 917.1* 670.6* 525.7*    Other results:  Imaging: ECHOCARDIOGRAM LIMITED  Result Date: 06/09/2022    ECHOCARDIOGRAM LIMITED REPORT   Patient Name:   VERLEY Kastelic Date of Exam: 06/09/2022 Medical Rec #:  [id number]  Height:       69.0 in Accession #:    [id number] Weight:       162.0 lb Date of Birth:  01/28/1961  BSA:          1.889 m Patient Age:    62 years   BP:           157/117 mmHg Patient Gender: M          HR:           89 bpm. Exam Location:  Inpatient Procedure: Limited Echo, Color Doppler and Cardiac Doppler Indications:     Aortic Stenosis i35.0  History:         Patient has prior history of Echocardiogram examinations, most                  recent 06/03/2022.  Risk Factors:Hypertension, Dyslipidemia and                  Sleep Apnea.  Sonographer:     Raquel Sarna Senior RDCS Referring Phys:  [phone] JILL D MCDANIEL Diagnosing Phys: Rudean Haskell MD  Sonographer Comments: TAVR IMPRESSIONS  1. Interventional echo for TAVR procedure.  2. Prior to procedure, mixed aortic valve disease. Mild aortic regurgitation and severe aortic stenosis. Despite low stroke volume mean gradient 40 mm Hg at maximum, peak gradient 67 mmHg. DVI 0.27.  3. After procedure 29 mm Sapien Valve was placed. There was no AI (no PVL). Mean gradient was 3 mm Hg, peak gradient 5 mm HG. Effective orifice area 3.73 cm2.  4. Effusion unchanged through the procedure  5. Left ventricular ejection fraction, by estimation, is 45%. The left ventricle has mildly decreased function. There is severe concentric left ventricular hypertrophy.  6. Right ventricular systolic function is normal.  7. The mitral valve is abnormal. Moderate to severe mitral valve regurgitation. No evidence of mitral stenosis. MR is seen at worst in clip 16.  8. Tricuspid valve regurgitation is mild to moderate. Comparison(s): Succesful TAVR Placement. FINDINGS  Left Ventricle: Left ventricular ejection fraction, by estimation, is 45%. The left ventricle has mildly decreased function. There is severe concentric left ventricular hypertrophy. Right Ventricle: Right ventricular systolic function is normal. Pericardium: Trivial pericardial effusion is present. The pericardial effusion is circumferential. Mitral Valve: The mitral valve is abnormal. Moderate to severe mitral valve regurgitation. No evidence of mitral valve stenosis. Tricuspid Valve: Tricuspid valve regurgitation is mild to moderate. Aortic Valve: Aortic valve mean gradient measures 19.5 mmHg. Aortic valve peak gradient measures 24.6 mmHg. Aortic valve area, by VTI measures 1.21 cm. Additional Comments: Spectral Doppler performed. Color Doppler performed.  LEFT VENTRICLE PLAX 2D LVOT  diam:     2.40 cm LV  SV:         60 LV SV Index:   32 LVOT Area:     4.52 cm  AORTIC VALVE AV Area (Vmax):    1.42 cm AV Area (Vmean):   1.42 cm AV Area (VTI):     1.21 cm AV Vmax:           248.00 cm/s AV Vmean:          184.800 cm/s AV VTI:            0.495 m AV Peak Grad:      24.6 mmHg AV Mean Grad:      19.5 mmHg LVOT Vmax:         77.83 cm/s LVOT Vmean:        57.867 cm/s LVOT VTI:          0.132 m LVOT/AV VTI ratio: 0.27  SHUNTS Systemic VTI:  0.13 m Systemic Diam: 2.40 cm Rudean Haskell MD Electronically signed by Rudean Haskell MD Signature Date/Time: 06/09/2022/6:23:12 PM    Final    Structural Heart Procedure  Result Date: 06/09/2022 See surgical note for result.  DG Chest 2 View  Result Date: 06/08/2022 CLINICAL DATA:  Preop EXAM: CHEST - 2 VIEW COMPARISON:  X-[place] 06/02/2022 FINDINGS: Right IJ line in place with tip along the central SVC right atrial junction region. No pneumothorax. Enlarged cardiopericardial silhouette. No pneumothorax. Tiny bilateral pleural effusions. No edema. Overlapping cardiac leads. IMPRESSION: Tiny pleural effusions. Right IJ catheter.  No pneumothorax Electronically Signed   By: Jill Side M.D.   On: 06/08/2022 18:32    Medications:   Scheduled Medications:  allopurinol  100 mg Oral Daily   apixaban  5 mg Oral BID   atorvastatin  20 mg Oral Daily   Chlorhexidine Gluconate Cloth  6 each Topical Daily   furosemide  40 mg Oral Daily   sacubitril-valsartan  1 tablet Oral BID   sodium chloride flush  3 mL Intravenous Q12H   sodium chloride flush  3 mL Intravenous Q12H   spironolactone  12.5 mg Oral Daily   Infusions:  sodium chloride Stopped (06/08/22 1422)   sodium chloride Stopped (06/09/22 1421)   sodium chloride     sodium chloride     amiodarone 30 mg/hr (06/10/22 0029)   magnesium sulfate bolus IVPB 2 g (06/10/22 0931)   nitroGLYCERIN     PRN Medications: sodium chloride, sodium chloride, acetaminophen **OR** acetaminophen,  hydrALAZINE, morphine injection, ondansetron (ZOFRAN) IV, ondansetron (ZOFRAN) IV, mouth rinse, oxyCODONE, sodium chloride flush, traMADol  Assessment/Plan:  1. A/c systolic HF -> cardiogenic shock - Echo 3/1/24LV EF 40-45% severe AS severe MR - Echo 11/23 EF 55% severe AS mild MR - Cath 3/23: LAD apical 90% D2 90% LCx 50%d RCA 40%p (Medical rx) - Admitted with marked volume overload. Diuresis complicated worsening renal function. CO2 16.  - Suspect shock due to HF and not sepsis - [facility] 06/03/22: RA 17 PA 72/41 (50) PCWP 30 (v=45) FICK CI 2.1 Thermo 1.6. Started on milrinone and IV lasix - Co-ox 71%, stable. Now off milrinone 3/4 - Discontinued hydralazine 50 TID this AM - Transitioned losartan 25 mg daily to entresto with elevated BP - Start coreg 3.'125mg'$  BID  - Weight up 3lbs this Am, Continue PO lasix '40mg'$  daily   2. AS - Severe AS s/p TAVR 3/7 w/ Dr Burt Knack    3. MR  - Mod-Severe  - suspect exacerbated by volume overload and HTN - Will likely  need repair   4. Chronic A fib.  - Rates in 70s to low 100s on IV amio  - Transition to PO amiodarone this AM, continue eliquis  - Possible DCCV this hospitalization   5. CAD  - primarily non-obstructive with distal/branch vessel disease - no angina - continue medical management  6. AKI on CKD 3b due to ATN/shock - Baseline Scr 1.4-1.5  - Peak Scr 3.5  - Scr 1.35<1.10, likely in the setting of missed diuretic  - Diuresis per above   7. H/O Septic Knee 01/2022  - Coag negative staph. Completed daptomycin x 4 weeks then switched to linezolid 02/20/22  8. hypomagnesemia - supp Mg 1.8    9. UTI - UA large amount of leukocytes.  - Off farxiga would not rechallenge.  - s/p CTX  Length of Stay: [place] resident PGY3 06/10/2022, 10:03 AM  Advanced Heart Failure Team Pager [phone] (M-F; [place])  Please contact [facility] for night-coverage after hours (4p -7a ) and weekends on [email or web address]

## 2022-06-10 NOTE — Progress Notes (Signed)
Pt was educated on wt restrictions, no baths, daily wash-ups, s/s of infection, heart healthy diet, and CRPII. Will refer to [facility].   Reports that he walked with PT before I arrived.   [id number]   Christen Bame 06/10/2022 2:13 PM

## 2022-06-10 NOTE — Progress Notes (Signed)
Mobility Specialist Progress Note:   06/10/22 0950  Mobility  Activity Refused mobility   Refused d/t stomach hurting. Will follow-up as time allows.   Gareth Eagle Juanjose Mojica Mobility Specialist Please contact via [employer] or  Rehab Office at [phone]

## 2022-06-10 NOTE — Progress Notes (Addendum)
Pt declined ambulation, annoyed at beeping sounds.  Christen Bame 06/10/2022 11:49 AM

## 2022-06-11 DIAGNOSIS — Z1152 Encounter for screening for COVID-19: Secondary | ICD-10-CM | POA: Diagnosis not present

## 2022-06-11 DIAGNOSIS — A4150 Gram-negative sepsis, unspecified: Secondary | ICD-10-CM | POA: Diagnosis not present

## 2022-06-11 DIAGNOSIS — G9341 Metabolic encephalopathy: Secondary | ICD-10-CM | POA: Diagnosis not present

## 2022-06-11 DIAGNOSIS — Z006 Encounter for examination for normal comparison and control in clinical research program: Secondary | ICD-10-CM | POA: Diagnosis not present

## 2022-06-11 LAB — CBC
HCT: 31.4 % — ABNORMAL LOW (ref 39.0–52.0)
Hemoglobin: 10.4 g/dL — ABNORMAL LOW (ref 13.0–17.0)
MCH: 25.2 pg — ABNORMAL LOW (ref 26.0–34.0)
MCHC: 33.1 g/dL (ref 30.0–36.0)
MCV: 76.2 fL — ABNORMAL LOW (ref 80.0–100.0)
Platelets: 103 10*3/uL — ABNORMAL LOW (ref 150–400)
RBC: 4.12 MIL/uL — ABNORMAL LOW (ref 4.22–5.81)
RDW: 20.3 % — ABNORMAL HIGH (ref 11.5–15.5)
WBC: 6.1 10*3/uL (ref 4.0–10.5)
nRBC: 0 % (ref 0.0–0.2)

## 2022-06-11 LAB — BASIC METABOLIC PANEL
Anion gap: 5 (ref 5–15)
BUN: 19 mg/dL (ref 8–23)
CO2: 25 mmol/L (ref 22–32)
Calcium: 8.5 mg/dL — ABNORMAL LOW (ref 8.9–10.3)
Chloride: 106 mmol/L (ref 98–111)
Creatinine, Ser: 1.27 mg/dL — ABNORMAL HIGH (ref 0.61–1.24)
GFR, Estimated: >60 mL/min (ref >60)
Glucose, Bld: 112 mg/dL — ABNORMAL HIGH (ref 70–99)
Potassium: 3.5 mmol/L (ref 3.5–5.1)
Sodium: 136 mmol/L (ref 135–145)

## 2022-06-11 LAB — ECHOCARDIOGRAM COMPLETE
AR max vel: 1.97 cm2
AV Area VTI: 1.97 cm2
AV Area mean vel: 2 cm2
AV Mean grad: 10 mmHg
AV Peak grad: 13.9 mmHg
Ao pk vel: 1.86 m/s
Area-P 1/2: 4.6 cm2
Height: 69 in
MV M vel: 5.38 m/s
MV Peak grad: 115.8 mmHg
S' Lateral: 2.9 cm
Weight: 2664 oz

## 2022-06-11 LAB — APTT: aPTT: 44 seconds — ABNORMAL HIGH (ref 24–36)

## 2022-06-11 LAB — MAGNESIUM: Magnesium: 1.8 mg/dL (ref 1.7–2.4)

## 2022-06-11 LAB — COOXEMETRY PANEL
Carboxyhemoglobin: 3 % — ABNORMAL HIGH (ref 0.5–1.5)
Methemoglobin: <0.7 % (ref 0.0–1.5)
O2 Saturation: 66.1 %
Total hemoglobin: 10.8 g/dL — ABNORMAL LOW (ref 12.0–16.0)

## 2022-06-11 MED ORDER — AMIODARONE HCL 200 MG PO TABS
200.0000 mg | ORAL_TABLET | Freq: Two times a day (BID) | ORAL | Status: DC
  Administered 2022-06-11 – 2022-06-13 (×5): 200 mg via ORAL
  Filled 2022-06-11 (×5): qty 1

## 2022-06-11 MED ORDER — SPIRONOLACTONE 12.5 MG HALF TABLET: 12.5000 mg | ORAL_TABLET | Freq: Every day | ORAL | Status: DC

## 2022-06-11 MED ORDER — AMIODARONE HCL 200 MG PO TABS: 200.0000 mg | ORAL_TABLET | Freq: Every day | ORAL | Status: DC

## 2022-06-11 MED ORDER — METOCLOPRAMIDE HCL 5 MG/ML IJ SOLN
5.0000 mg | Freq: Four times a day (QID) | INTRAMUSCULAR | Status: DC | PRN
  Administered 2022-06-11: 5 mg via INTRAVENOUS
  Filled 2022-06-11: qty 2

## 2022-06-11 MED ORDER — SACUBITRIL-VALSARTAN 97-103 MG PO TABS
1.0000 | ORAL_TABLET | Freq: Two times a day (BID) | ORAL | Status: DC
  Administered 2022-06-11 – 2022-06-13 (×4): 1 via ORAL
  Filled 2022-06-11 (×5): qty 1

## 2022-06-11 NOTE — Plan of Care (Signed)
  Problem: Education: Goal: Ability to demonstrate management of disease process will improve Outcome: Progressing   Problem: Activity: Goal: Capacity to carry out activities will improve Outcome: Progressing   Problem: Activity: Goal: Risk for activity intolerance will decrease Outcome: Progressing   Problem: Nutrition: Goal: Adequate nutrition will be maintained Outcome: Progressing

## 2022-06-11 NOTE — Progress Notes (Addendum)
Advanced Heart Failure Rounding Note   Subjective:   Taken to cath lab 3/1 for RHC. Found to be in cardiogenic shock. [place] 06/03/22: RA 17 PA 72/41 (50) PCWP 30 (v=45) FICK CI 2.1 Thermo 1.6. Started on milrinone and IV lasix  Echo severe calcific aortic stenosis and severe central mitral regurgitation. EF 45-50%  S/p TAVR 3/7  Off inotropes. Co-ox 66%  Denies CP or SOB BP remains high. Scr improving 1.4 ->1.2  He is in NSR today  Objective:   Weight Range:  Vital Signs:   Temp:  [98.1 F (36.7 C)-98.8 F (37.1 C)] 98.1 F (36.7 C) (03/09 1104) Pulse Rate:  [77-84] 84 (03/09 1104) Resp:  [14-22] 17 (03/09 1104) BP: (135-164)/(85-102) 164/101 (03/09 1104) SpO2:  [95 %-98 %] 97 % (03/09 1104) Weight:  [73.6 kg] 73.6 kg (03/09 0522) Last BM Date : 06/10/22  Weight change: Filed Weights   06/09/22 1110 06/10/22 0610 06/11/22 0522  Weight: 73.9 kg 75.5 kg 73.6 kg   Intake/Output:   Intake/Output Summary (Last 24 hours) at 06/11/2022 1243 Last data filed at 06/11/2022 0741 Gross per 24 hour  Intake 540.96 ml  Output 900 ml  Net -359.04 ml      Physical Exam  General:  Well appearing. No resp difficulty HEENT: normal Neck: supple. [place]  Carotids 2+ bilat; no bruits. No lymphadenopathy or thryomegaly appreciated. Cor: PMI nondisplaced. Regular rate & rhythm. No rubs, gallops or murmurs. Lungs: clear Abdomen: soft, nontender, nondistended. No hepatosplenomegaly. No bruits or masses. Good bowel sounds. Extremities: no cyanosis, clubbing, rash, edema Neuro: alert & orientedx3, cranial nerves grossly intact. moves all 4 extremities w/o difficulty. Affect pleasant   Telemetry: SR 80s Personally reviewed   Labs: Basic Metabolic Panel: Recent Labs  Lab 06/04/22 1930 06/05/22 0500 06/07/22 0124 06/08/22 0520 06/09/22 0500 06/09/22 1313 06/09/22 1515 06/10/22 0445 06/10/22 1500 06/11/22 0446  NA 139   < > 135 138 138 140 140 138 135 136  K 3.7   < > 4.3  4.0 3.3* 3.2* 3.3* 4.5 3.5 3.5  CL 100   < > 102 104 104 104 104 106 104 106  CO2 27   < > '24 22 25  '$ --   --  '23 25 25  '$ GLUCOSE 92   < > 102* 92 88 90 103* 96 157* 112*  BUN 43*   < > 35* 28* '21 18 19 23 20 19  '$ CREATININE 2.31*   < > 1.45* 1.39* 1.23 1.10 1.10 1.35* 1.42* 1.27*  CALCIUM 8.6*   < > 8.7* 8.8* 8.8*  --   --  8.8* 8.6* 8.5*  MG 1.8   < > 1.9 1.8 2.2  --   --  1.8  --  1.8  PHOS 2.6  --   --   --   --   --   --   --   --   --    < > = values in this interval not displayed.    Liver Function Tests: Recent Labs  Lab 06/04/22 1930  AST 21  ALT 21  ALKPHOS 123  BILITOT 1.0  PROT 5.9*  ALBUMIN 2.8*    CBC: Recent Labs  Lab 06/05/22 0500 06/09/22 0500 06/09/22 1313 06/09/22 1515 06/10/22 0445 06/11/22 0446  WBC 4.0 3.8*  --   --  5.1 6.1  NEUTROABS 2.0  --   --   --   --   --   HGB 11.5* 10.6*  12.9* 11.9* 10.6* 10.4*  HCT 34.6* 32.7* 38.0* 35.0* 32.0* 31.4*  MCV 75.2* 76.0*  --   --  75.8* 76.2*  PLT 143* 143*  --   --  127* 103*    BNP: BNP (last 3 results) Recent Labs    05/31/22 1648 06/01/22 1629 06/02/22 0554  BNP 917.1* 670.6* 525.7*    Other results:  Imaging: ECHOCARDIOGRAM LIMITED  Result Date: 06/09/2022    ECHOCARDIOGRAM LIMITED REPORT   Patient Name:   [name] Date of Exam: 06/09/2022 Medical Rec #:  [id number]  Height:       69.0 in Accession #:    [id number] Weight:       162.0 lb Date of Birth:  09/07/1960  BSA:          1.889 m Patient Age:    9 years   BP:           157/117 mmHg Patient Gender: M          HR:           89 bpm. Exam Location:  Inpatient Procedure: Limited Echo, Color Doppler and Cardiac Doppler Indications:     Aortic Stenosis i35.0  History:         Patient has prior history of Echocardiogram examinations, most                  recent 06/03/2022. Risk Factors:Hypertension, Dyslipidemia and                  Sleep Apnea.  Sonographer:     Raquel Sarna Senior RDCS Referring Phys:  [phone] JILL D MCDANIEL Diagnosing Phys: Rudean Haskell MD  Sonographer Comments: TAVR IMPRESSIONS  1. Interventional echo for TAVR procedure.  2. Prior to procedure, mixed aortic valve disease. Mild aortic regurgitation and severe aortic stenosis. Despite low stroke volume mean gradient 40 mm Hg at maximum, peak gradient 67 mmHg. DVI 0.27.  3. After procedure 29 mm Sapien Valve was placed. There was no AI (no PVL). Mean gradient was 3 mm Hg, peak gradient 5 mm HG. Effective orifice area 3.73 cm2.  4. Effusion unchanged through the procedure  5. Left ventricular ejection fraction, by estimation, is 45%. The left ventricle has mildly decreased function. There is severe concentric left ventricular hypertrophy.  6. Right ventricular systolic function is normal.  7. The mitral valve is abnormal. Moderate to severe mitral valve regurgitation. No evidence of mitral stenosis. MR is seen at worst in clip 16.  8. Tricuspid valve regurgitation is mild to moderate. Comparison(s): Succesful TAVR Placement. FINDINGS  Left Ventricle: Left ventricular ejection fraction, by estimation, is 45%. The left ventricle has mildly decreased function. There is severe concentric left ventricular hypertrophy. Right Ventricle: Right ventricular systolic function is normal. Pericardium: Trivial pericardial effusion is present. The pericardial effusion is circumferential. Mitral Valve: The mitral valve is abnormal. Moderate to severe mitral valve regurgitation. No evidence of mitral valve stenosis. Tricuspid Valve: Tricuspid valve regurgitation is mild to moderate. Aortic Valve: Aortic valve mean gradient measures 19.5 mmHg. Aortic valve peak gradient measures 24.6 mmHg. Aortic valve area, by VTI measures 1.21 cm. Additional Comments: Spectral Doppler performed. Color Doppler performed.  LEFT VENTRICLE PLAX 2D LVOT diam:     2.40 cm LV SV:         60 LV SV Index:   32 LVOT Area:     4.52 cm  AORTIC VALVE AV Area (Vmax):    1.42 cm AV Area (Vmean):  1.42 cm AV Area (VTI):     1.21  cm AV Vmax:           248.00 cm/s AV Vmean:          184.800 cm/s AV VTI:            0.495 m AV Peak Grad:      24.6 mmHg AV Mean Grad:      19.5 mmHg LVOT Vmax:         77.83 cm/s LVOT Vmean:        57.867 cm/s LVOT VTI:          0.132 m LVOT/AV VTI ratio: 0.27  SHUNTS Systemic VTI:  0.13 m Systemic Diam: 2.40 cm Rudean Haskell MD Electronically signed by Rudean Haskell MD Signature Date/Time: 06/09/2022/6:23:12 PM    Final    Structural Heart Procedure  Result Date: 06/09/2022 See surgical note for result.   Medications:   Scheduled Medications:  allopurinol  100 mg Oral Daily   amiodarone  200 mg Oral BID   apixaban  5 mg Oral BID   atorvastatin  20 mg Oral Daily   carvedilol  3.125 mg Oral BID WC   Chlorhexidine Gluconate Cloth  6 each Topical Daily   sacubitril-valsartan  1 tablet Oral BID   sodium chloride flush  3 mL Intravenous Q12H   sodium chloride flush  3 mL Intravenous Q12H   spironolactone  12.5 mg Oral Daily   Infusions:  sodium chloride Stopped (06/08/22 1422)   sodium chloride Stopped (06/09/22 1421)   sodium chloride     sodium chloride     nitroGLYCERIN     PRN Medications: sodium chloride, sodium chloride, acetaminophen **OR** acetaminophen, hydrALAZINE, morphine injection, ondansetron (ZOFRAN) IV, ondansetron (ZOFRAN) IV, mouth rinse, oxyCODONE, sodium chloride flush, traMADol  Assessment/Plan:  1. A/c systolic HF -> cardiogenic shock - Echo 3/1/24LV EF 40-45% severe AS severe MR - Echo 11/23 EF 55% severe AS mild MR - Cath 3/23: LAD apical 90% D2 90% LCx 50%d RCA 40%p (Medical rx) - Admitted with marked volume overload. Diuresis complicated worsening renal function. CO2 16.  - Suspect shock due to HF and not sepsis - [facility] 06/03/22: RA 17 PA 72/41 (50) PCWP 30 (v=45) FICK CI 2.1 Thermo 1.6. Started on milrinone and IV lasix - s/p TAVR 3/7 - Post-TAVR echo 06/09/21 EF 60-65% mod MR - Co-ox 66%, stable. Now off milrinone 3/4 - Hydralazine stopped with  improvement in renal function - Increase Entresto to 97/103 bid - Add back spiro 12.5 - Continue carvedilol 3.'125mg'$  BID  - Continue PO lasix '40mg'$  daily  - Can pull central line  2. AS - Severe AS s/p TAVR 3/7 w/ Dr Burt Knack    3. MR  - Mod-Severe  - suspect exacerbated by volume overload and HTN - Post TAVR echo with moderate MR -> follow  4. Chronic A fib. -> NSR - He is now back in NSR - Continue amio 200 bd  5. CAD  - primarily non-obstructive with distal/branch vessel disease - no angina - continue medical management  6. AKI on CKD 3b due to ATN/shock - Baseline Scr 1.4-1.5  - Peak Scr 3.5  - Resolved  7. PVCs/NSVT - improved off milrinone - continue amio for AF - Keep K > 4.0 Mg > 2.0  8. HTN - increase Entresto  9. H/O Septic Knee 01/2022  - Coag negative staph. Completed daptomycin x 4 weeks then switched to linezolid 02/20/22  10. UTI - UA large amount of leukocytes.  - Off farxiga would not rechallenge.  - s/p CTX  11. Dispo - should be ready for d/c soon once BP down and meds adjusted   Length of Stay: 9  Omara Alcon MD 06/11/2022, 12:43 PM  Advanced Heart Failure Team Pager [phone] (M-F; 7a - 4p)  Please contact [facility] for night-coverage after hours (4p -7a ) and weekends on [email or web address]

## 2022-06-11 NOTE — Progress Notes (Addendum)
Progress Note   Patient: [name] [id number] DOB: [birth year] DOA: 06/01/2022     9 DOS: the patient was seen and examined on 06/11/2022   Brief hospital course: Mr. Vorwerk was admitted to the hospital with the working diagnosis of heart failure.   62 yo male with the past medical history of aortic stenosis, coronary artery disease, CVA, CKD, and hx of right knee septic arthritis who presented with dyspnea. Patient has been off his medications for several months. He developed worsening dyspnea, lower extremity edema, chest and abdominal pain that prompted him to come to the hospital. On his initial physical examination his blood pressure was 83/69, HR 59, RR 30 and 02 saturation 94%, lungs with no wheezing or rales, heart with S1 and S2 present and rhythmic, abdomen with no distention, positive lower extremity edema.   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  Patient was placed on antibiotic therapy and diuretics.  -3/1, worsening kidney function, echo noted worsening systolic function with severe MR, CHF team consulted -Right heart cath 3/1, markedly elevated filling pressures, low cardiac output, started on Lasix gtt. and milrinone -3/2 night-Lasix GTT discontinued -3/3: Started Amio gtt., restarted milrinone -3/4, milrinone discontinued 3/5: Structural heart team consulting  03/07 TAVR  03/08 clinically stable, continue on IV amiodarone.   Assessment and Plan: * Sepsis secondary to UTI ([name]) Klebsiella urine infection, severe sepsis present on admission.  Patient has been responding well to antibiotic therapy, he had Ceftriaxone IV and now transitioned to oral cephalexin. Completed 10 days of therapy.   Acute on chronic systolic CHF (congestive heart failure) (HCC) Echocardiogram with reduced Lv systolic function 45 to [id number] with severe concentric hypertrophy, RV systolic function preserved, LA  and RA with severe dilatation, severe mitral valve regurgitation, severe low flow aortic valve stenosis.   Continue with carvedilol., entresto and spironolactone.    Acute renal failure superimposed on chronic kidney disease (HCC) CKD stage 3a. Hypokalemia.   Today renal function with serum cr at 1,27 with K at 3,5 and serum bicarbonate at 25  Avoid hypotension and nephrotoxic medications.  Follow up renal function in am.   Anemia Cell count has been stable with hgb at  11.5   HTN (hypertension) Patient on entresto and spironolactone for blood pressure control.   CAD (coronary artery disease) No  chest pain   Atrial fibrillation ([place]) Transitioned to po amiodarone.  Anticoagulation with apixaban        Subjective: patient having nausea today, no chest pain and no dyspnea.   Physical Exam: Vitals:   06/11/22 0435 06/11/22 0522 06/11/22 0741 06/11/22 1104  BP: (!) 142/97  (!) 157/102 (!) 164/101  Pulse: 80 84 81 84  Resp: '20 14 16 17  '$ Temp: 98.1 F (36.7 C)  98.1 F (36.7 C) 98.1 F (36.7 C)  TempSrc: Oral  Oral Oral  SpO2: 95% 97% 95% 97%  Weight:  73.6 kg    Height:       Neurology awake and alert ENT with mild pallor Cardiovascular with S1 and S2 present and rhythmic, positive systolic murmur at the apex No JVD NO lower extremity edema Respiratory with no rales or wheezing Abdomen with no distention  Data Reviewed:    Family Communication: family at the bedside   Disposition: Status is: Inpatient Remains inpatient appropriate because: post op care   Planned Discharge Destination: Home      Author: Riccardo Dubin  Gerome Apley, MD 06/11/2022 2:58 PM  For on call review [email or web address].

## 2022-06-11 NOTE — Progress Notes (Signed)
CARDIAC REHAB PHASE I   Attempt to amb pt, pt in bed sleeping and requested return at later time. Will f/u as time allows.    KIRUBEL NUNN, MS 06/11/2022 10:46 AM

## 2022-06-12 DIAGNOSIS — I10 Essential (primary) hypertension: Secondary | ICD-10-CM

## 2022-06-12 LAB — BASIC METABOLIC PANEL
Anion gap: 10 (ref 5–15)
BUN: 20 mg/dL (ref 8–23)
CO2: 19 mmol/L — ABNORMAL LOW (ref 22–32)
Calcium: 8.5 mg/dL — ABNORMAL LOW (ref 8.9–10.3)
Chloride: 106 mmol/L (ref 98–111)
Creatinine, Ser: 1.22 mg/dL (ref 0.61–1.24)
GFR, Estimated: >60 mL/min (ref >60)
Glucose, Bld: 102 mg/dL — ABNORMAL HIGH (ref 70–99)
Potassium: 4.4 mmol/L (ref 3.5–5.1)
Sodium: 135 mmol/L (ref 135–145)

## 2022-06-12 LAB — CBC
HCT: 31.6 % — ABNORMAL LOW (ref 39.0–52.0)
Hemoglobin: 10.6 g/dL — ABNORMAL LOW (ref 13.0–17.0)
MCH: 25.1 pg — ABNORMAL LOW (ref 26.0–34.0)
MCHC: 33.5 g/dL (ref 30.0–36.0)
MCV: 74.7 fL — ABNORMAL LOW (ref 80.0–100.0)
Platelets: 107 10*3/uL — ABNORMAL LOW (ref 150–400)
RBC: 4.23 MIL/uL (ref 4.22–5.81)
RDW: 19.5 % — ABNORMAL HIGH (ref 11.5–15.5)
WBC: 8.6 10*3/uL (ref 4.0–10.5)
nRBC: 0 % (ref 0.0–0.2)

## 2022-06-12 LAB — MAGNESIUM: Magnesium: 1.7 mg/dL (ref 1.7–2.4)

## 2022-06-12 LAB — APTT: aPTT: 48 seconds — ABNORMAL HIGH (ref 24–36)

## 2022-06-12 MED ORDER — ISOSORB DINITRATE-HYDRALAZINE 20-37.5 MG PO TABS
0.5000 | ORAL_TABLET | Freq: Three times a day (TID) | ORAL | Status: DC
  Administered 2022-06-12 – 2022-06-13 (×3): 0.5 via ORAL
  Filled 2022-06-12 (×4): qty 0.5

## 2022-06-12 MED ORDER — DIPHENHYDRAMINE HCL 25 MG PO CAPS
25.0000 mg | ORAL_CAPSULE | Freq: Three times a day (TID) | ORAL | Status: AC | PRN
  Administered 2022-06-12: 25 mg via ORAL
  Filled 2022-06-12: qty 1

## 2022-06-12 NOTE — Progress Notes (Signed)
Advanced Heart Failure Rounding Note   Subjective:   Taken to cath lab 3/1 for RHC. Found to be in cardiogenic shock. [place] 06/03/22: RA 17 PA 72/41 (50) PCWP 30 (v=45) FICK CI 2.1 Thermo 1.6. Started on milrinone and IV lasix  Echo severe calcific aortic stenosis and severe central mitral regurgitation. EF 45-50%  S/p TAVR 3/7  Off inotropes; sCr normalized. Euvolemic on exam. Remains very hypertensive. Otherwise, feels great with no complaints.   Objective:   Weight Range:  Vital Signs:   Temp:  [98.3 F (36.8 C)-99.5 F (37.5 C)] 99.5 F (37.5 C) (03/10 0815) Pulse Rate:  [84-92] 89 (03/10 0815) Resp:  [16-20] 20 (03/10 0815) BP: (136-180)/(94-107) 171/99 (03/10 0815) SpO2:  [90 %-97 %] 96 % (03/10 0815) Weight:  [74.6 kg] 74.6 kg (03/10 0647) Last BM Date : 06/10/22  Weight change: Filed Weights   06/10/22 0610 06/11/22 0522 06/12/22 0647  Weight: 75.5 kg 73.6 kg 74.6 kg   Intake/Output:   Intake/Output Summary (Last 24 hours) at 06/12/2022 1143 Last data filed at 06/12/2022 0824 Gross per 24 hour  Intake 700 ml  Output 140 ml  Net 560 ml      Physical Exam  General:  Well appearing. No resp difficulty HEENT: normal Neck: supple.  Carotids 2+ bilat; no bruits. No lymphadenopathy or thryomegaly appreciated. Cor: PMI nondisplaced. Regular rate & rhythm. No rubs, gallops or murmurs. Lungs: clear Abdomen: soft, nontender, nondistended. No hepatosplenomegaly. No bruits or masses. Good bowel sounds. Extremities: no cyanosis, clubbing, rash, edema Neuro: alert & orientedx3, cranial nerves grossly intact. moves all 4 extremities w/o difficulty. Affect pleasant   Telemetry: SR 80s-90s Personally reviewed   Labs: Basic Metabolic Panel: Recent Labs  Lab 06/08/22 0520 06/09/22 0500 06/09/22 1313 06/09/22 1515 06/10/22 0445 06/10/22 1500 06/11/22 0446 06/12/22 0120  NA 138 138   < > 140 138 135 136 135  K 4.0 3.3*   < > 3.3* 4.5 3.5 3.5 4.4  CL 104 104    < > 104 106 104 106 106  CO2 22 25  --   --  '23 25 25 '$ 19*  GLUCOSE 92 88   < > 103* 96 157* 112* 102*  BUN 28* 21   < > '19 23 20 19 20  '$ CREATININE 1.39* 1.23   < > 1.10 1.35* 1.42* 1.27* 1.22  CALCIUM 8.8* 8.8*  --   --  8.8* 8.6* 8.5* 8.5*  MG 1.8 2.2  --   --  1.8  --  1.8 1.7   < > = values in this interval not displayed.    Liver Function Tests: No results for input(s): "AST", "ALT", "ALKPHOS", "BILITOT", "PROT", "ALBUMIN" in the last 168 hours.  CBC: Recent Labs  Lab 06/09/22 0500 06/09/22 1313 06/09/22 1515 06/10/22 0445 06/11/22 0446 06/12/22 0120  WBC 3.8*  --   --  5.1 6.1 8.6  HGB 10.6* 12.9* 11.9* 10.6* 10.4* 10.6*  HCT 32.7* 38.0* 35.0* 32.0* 31.4* 31.6*  MCV 76.0*  --   --  75.8* 76.2* 74.7*  PLT 143*  --   --  127* 103* 107*    BNP: BNP (last 3 results) Recent Labs    05/31/22 1648 06/01/22 1629 06/02/22 0554  BNP 917.1* 670.6* 525.7*    Other results:  Imaging: ECHOCARDIOGRAM COMPLETE  Result Date: 06/11/2022    ECHOCARDIOGRAM REPORT   Patient Name:   [name] Date of Exam: 06/10/2022 Medical Rec #:  [id number]  Height:       69.0 in Accession #:    [id number] Weight:       166.5 lb Date of Birth:  1961-01-05  BSA:          1.911 m Patient Age:    62 years   BP:           144/80 mmHg Patient Gender: M          HR:           80 bpm. Exam Location:  Inpatient Procedure: 2D Echo, Cardiac Doppler and Color Doppler Indications:    Post TAVR evaluation V43.3/Z95.2  History:        Patient has prior history of Echocardiogram examinations, most                 recent 06/09/2022. CHF, CAD, PAD and Stroke, Arrythmias:Atrial                 Fibrillation, Signs/Symptoms:Chest Pain; Risk                 Factors:Hypertension and Sleep Apnea. CKD, stage 3.                 Aortic Valve: 29 mm a valve is present in the aortic position.                 Procedure Date: 06/09/22.  Sonographer:    Ronny Flurry Referring Phys: [place]  1. The aortic valve  has been replaced with a 29 mm Sapien Valve. Aortic valve regurgitation is at most mild, paravalvular and adjacent to the intraventricular septum. There is a 29 mm a valve present in the aortic position. Procedure Date: 06/09/22. Mean gradient 10 m Hg, Peak gradient 19 mm Hg, DVI 0.54 and EOA 1.53 cm2.  2. Left ventricular ejection fraction, by estimation, is 45 to 50%. The left ventricle has mildly decreased function. The left ventricle demonstrates global hypokinesis. There is severe concentric left ventricular hypertrophy. Left ventricular diastolic  function could not be evaluated.  3. Right ventricular systolic function is normal. The right ventricular size is normal.  4. Left atrial size was severely dilated.  5. Right atrial size was severely dilated.  6. The mitral valve is abnormal. Moderate to severe mitral valve regurgitation. No evidence of mitral stenosis.  7. Aortic dilatation noted. There is mild dilatation of the ascending aorta, measuring 40 mm.  8. The inferior vena cava is normal in size with greater than 50% respiratory variability, suggesting right atrial pressure of 3 mmHg. Comparison(s): Prior images reviewed side by side. Compared to prior, PVL is new. FINDINGS  Left Ventricle: Left ventricular ejection fraction, by estimation, is 45 to 50%. The left ventricle has mildly decreased function. The left ventricle demonstrates global hypokinesis. The left ventricular internal cavity size was normal in size. There is  severe concentric left ventricular hypertrophy. Left ventricular diastolic function could not be evaluated due to mitral regurgitation (moderate or greater). Left ventricular diastolic function could not be evaluated. Right Ventricle: The right ventricular size is normal. No increase in right ventricular wall thickness. Right ventricular systolic function is normal. Left Atrium: Left atrial size was severely dilated. Right Atrium: Right atrial size was severely dilated. Pericardium:  Trivial pericardial effusion is present. Mitral Valve: The mitral valve is abnormal. Moderate to severe mitral valve regurgitation. No evidence of mitral valve stenosis. Tricuspid Valve: The tricuspid valve is normal in structure. Tricuspid valve regurgitation is mild.  Aortic Valve: The aortic valve has been repaired/replaced. Aortic valve regurgitation is mild. Aortic valve mean gradient measures 10.0 mmHg. Aortic valve peak gradient measures 13.9 mmHg. Aortic valve area, by VTI measures 1.97 cm. There is a 29 mm a valve present in the aortic position. Procedure Date: 06/09/22. Pulmonic Valve: The pulmonic valve was not well visualized. Pulmonic valve regurgitation is not visualized. No evidence of pulmonic stenosis. Aorta: Aortic dilatation noted. There is mild dilatation of the ascending aorta, measuring 40 mm. Venous: The inferior vena cava is normal in size with greater than 50% respiratory variability, suggesting right atrial pressure of 3 mmHg. IAS/Shunts: No atrial level shunt detected by color flow Doppler.  LEFT VENTRICLE PLAX 2D LVIDd:         4.60 cm   Diastology LVIDs:         2.90 cm   LV e' medial:    6.96 cm/s LV PW:         1.90 cm   LV E/e' medial:  15.7 LV IVS:        1.30 cm   LV e' lateral:   8.59 cm/s LVOT diam:     1.90 cm   LV E/e' lateral: 12.7 LV SV:         55 LV SV Index:   29 LVOT Area:     2.84 cm  RIGHT VENTRICLE             IVC RV S prime:     14.40 cm/s  IVC diam: 1.70 cm TAPSE (M-mode): 1.8 cm LEFT ATRIUM              Index        RIGHT ATRIUM           Index LA diam:        5.60 cm  2.93 cm/m   RA Area:     29.40 cm LA Vol (A2C):   134.0 ml 70.11 ml/m  RA Volume:   93.40 ml  48.87 ml/m LA Vol (A4C):   112.0 ml 58.60 ml/m LA Biplane Vol: 122.0 ml 63.84 ml/m  AORTIC VALVE AV Area (Vmax):    1.97 cm AV Area (Vmean):   2.00 cm AV Area (VTI):     1.97 cm AV Vmax:           186.25 cm/s AV Vmean:          114.700 cm/s AV VTI:            0.280 m AV Peak Grad:      13.9 mmHg AV  Mean Grad:      10.0 mmHg LVOT Vmax:         129.33 cm/s LVOT Vmean:        80.867 cm/s LVOT VTI:          0.194 m LVOT/AV VTI ratio: 0.69  AORTA Ao Root diam: 3.60 cm MITRAL VALVE                TRICUSPID VALVE MV Area (PHT): 4.60 cm     TR Peak grad:   41.0 mmHg MV Decel Time: 165 msec     TR Vmax:        320.00 cm/s MR Peak grad: 115.8 mmHg MR Mean grad: 68.0 mmHg     SHUNTS MR Vmax:      538.00 cm/s   Systemic VTI:  0.19 m MR Vmean:     376.0 cm/s    Systemic Diam:  1.90 cm MV E velocity: 109.00 cm/s MV A velocity: 42.10 cm/s MV E/A ratio:  2.59 Rudean Haskell MD Electronically signed by Rudean Haskell MD Signature Date/Time: 06/11/2022/2:05:56 PM    Final     Medications:   Scheduled Medications:  allopurinol  100 mg Oral Daily   amiodarone  200 mg Oral BID   apixaban  5 mg Oral BID   atorvastatin  20 mg Oral Daily   carvedilol  3.125 mg Oral BID WC   Chlorhexidine Gluconate Cloth  6 each Topical Daily   isosorbide-hydrALAZINE  0.5 tablet Oral TID   sacubitril-valsartan  1 tablet Oral BID   sodium chloride flush  3 mL Intravenous Q12H   sodium chloride flush  3 mL Intravenous Q12H   spironolactone  12.5 mg Oral Daily   Infusions:  sodium chloride Stopped (06/08/22 1422)   sodium chloride Stopped (06/09/22 1421)   sodium chloride     sodium chloride     PRN Medications: sodium chloride, sodium chloride, acetaminophen **OR** acetaminophen, metoCLOPramide (REGLAN) injection, morphine injection, mouth rinse, oxyCODONE, sodium chloride flush, traMADol  Assessment/Plan:  1. A/c systolic HF -> cardiogenic shock - Echo 3/1/24LV EF 40-45% severe AS severe MR - Echo 11/23 EF 55% severe AS mild MR - Cath 3/23: LAD apical 90% D2 90% LCx 50%d RCA 40%p (Medical rx) - Admitted with marked volume overload. Diuresis complicated worsening renal function. CO2 16.  - Suspect shock due to HF and not sepsis - RHC 06/03/22: RA 17 PA 72/41 (50) PCWP 30 (v=45) FICK CI 2.1 Thermo 1.6. Started on  milrinone and IV lasix - s/p TAVR 3/7 - Post-TAVR echo 06/09/21 EF 60-65% mod MR - Co-ox 66%, stable. Now off milrinone 3/4 - Continue Entresto to 97/103 bid - Add back spiro 12.5 - Starting bidil 0.5 tab TID.  - Continue carvedilol 3.'125mg'$  BID  - Continue PO lasix '40mg'$  daily  - Can pull central line  2. AS - Severe AS s/p TAVR 3/7 w/ Dr Burt Knack    3. MR  - Mod-Severe  - Post TAVR echo with moderate MR -> follow - Continue afterload reduction.   4. Chronic A fib. -> NSR - He is now back in NSR - Continue amio 200 bd  5. CAD  - primarily non-obstructive with distal/branch vessel disease - no angina - continue medical management  6. AKI on CKD 3b due to ATN/shock - Baseline Scr 1.4-1.5  - Peak Scr 3.5  - Resolved  7. PVCs/NSVT - improved off milrinone - continue amio for AF - Keep K > 4.0 Mg > 2.0  8. HTN - increased Entresto yesterday - starting bidil 1/2 tab TID  9. H/O Septic Knee 01/2022  - Coag negative staph. Completed daptomycin x 4 weeks then switched to linezolid 02/20/22    10. UTI - UA large amount of leukocytes.  - Off farxiga would not rechallenge.  - s/p CTX  11. Dispo - should be ready for d/c soon once BP down and meds adjusted   Length of Stay: [place] MD 06/12/2022, 11:43 AM  Advanced Heart Failure Team Pager [phone] (M-F; [place])  Please contact [facility] for night-coverage after hours (4p -7a ) and weekends on [email or web address]

## 2022-06-12 NOTE — Progress Notes (Signed)
Progress Note   Patient: [name] [id number] DOB: [birth year] DOA: 06/01/2022     10 DOS: the patient was seen and examined on 06/12/2022   Brief hospital course: [name] was admitted to the hospital with the working diagnosis of heart failure.   62 yo male with the past medical history of aortic stenosis, coronary artery disease, CVA, CKD, and hx of right knee septic arthritis who presented with dyspnea. Patient has been off his medications for several months. He developed worsening dyspnea, lower extremity edema, chest and abdominal pain that prompted him to come to the hospital. On his initial physical examination his blood pressure was 83/69, HR 59, RR 30 and 02 saturation 94%, lungs with no wheezing or rales, heart with S1 and S2 present and rhythmic, abdomen with no distention, positive lower extremity edema.   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  Patient was placed on antibiotic therapy and diuretics.  -3/1, worsening kidney function, echo noted worsening systolic function with severe MR, CHF team consulted -Right heart cath 3/1, markedly elevated filling pressures, low cardiac output, started on Lasix gtt. and milrinone -3/2 night-Lasix GTT discontinued -3/3: Started Amio gtt., restarted milrinone -3/4, milrinone discontinued 3/5: Structural heart team consulting  03/07 TAVR  03/08 clinically stable, continue on IV amiodarone.  03/09 amiodarone changed to po. CVC removed.  03/10 patient clinically improved.   Assessment and Plan: * Sepsis secondary to UTI ([name]) Klebsiella urine infection, severe sepsis present on admission.  Patient has been responding well to antibiotic therapy, he had Ceftriaxone IV and now transitioned to oral cephalexin. Completed 10 days of therapy.   Acute on chronic systolic CHF (congestive heart failure) (HCC) Echocardiogram with reduced Lv systolic  function 45 to [id number] with severe concentric hypertrophy, RV systolic function preserved, LA and RA with severe dilatation, severe mitral valve regurgitation, severe low flow aortic valve stenosis.   03/01 cardiac catheterization.  PA 72/41 (50) PCWP = 30 PVR= 3.2  Cardiac output 4.3 and index 2.1   Pre and post capillary pulmonary hypertension.  Patient was placed on inotropic (milrinone) support for low cardiac output heart failure.  Furosemide drip for diuresis.   Fluid balance is negative 12,189 ml since admission.   Continue with carvedilol., entresto and spironolactone.   Acute renal failure superimposed on chronic kidney disease (HCC) CKD stage 3a. Hypokalemia.   Improved volume status, renal function today with serum cr at 1,22 with K at 4,4 and serum bicarbonate at 19.  NA 135   Continue to hold on loop diuretic for now.  Avoid hypotension and nephrotoxic medications.  Follow up renal function in am.   Anemia Cell count has been stable with hgb at  10.6   HTN (hypertension) Patient on entresto and spironolactone for blood pressure control.   CAD (coronary artery disease) No  chest pain   Atrial fibrillation ([place]) Transitioned to po amiodarone.  Anticoagulation with apixaban        Subjective: Patient is feeling well, no chest pain and no dyspnea, he has been out of bed, central line has been removed.   Physical Exam: Vitals:   06/11/22 2322 06/12/22 0344 06/12/22 0647 06/12/22 0815  BP: (!) 141/101 (!) 180/107  (!) 171/99  Pulse: 92 88  89  Resp: '19 20  20  '$ Temp: 98.6 F (37 C) 98.6 F (37 C)  99.5 F (37.5 C)  TempSrc: Oral Oral  Axillary  SpO2: 97% 92%  96%  Weight:   74.6 kg   Height:       Neurology awake and alert ENT with mild pallor Cardiovascular with S1 and S2 present and regular with no gallops, positive murmur systolic at the apex,  No JVD No lower extremity edema Respiratory with no rales or wheezing Abdomen with no distention   Data Reviewed: {Tip this will not be part of the note when signed- Document your independent interpretation of telemetry tracing, EKG, lab, Radiology test or any other diagnostic tests. Add any new diagnostic test ordered today. (Optional):[id number]    Family Communication: his family at the bedside   Disposition: Status is: Inpatient Remains inpatient appropriate because: possible dc tomorrow.   Planned Discharge Destination: Home      Author: Tawni Millers, MD 06/12/2022 9:39 AM  For on call review [email or web address].

## 2022-06-12 NOTE — Progress Notes (Signed)
Mobility Specialist - Progress Note   06/12/22 1148  Mobility  Activity Ambulated with assistance in hallway  Level of Assistance Standby assist, set-up cues, supervision of patient - no hands on  Assistive Device None  Distance Ambulated (ft) 500 ft  Activity Response Tolerated well  Mobility Referral Yes  $Mobility charge 1 Mobility    Pt received in bed and agreeable. No complaints on walk, tolerated distance well. Left sitting EOB w/ call bell in reach and all needs met.   [place] Specialist Please contact via SecureChat or Rehab office at [phone]

## 2022-06-13 ENCOUNTER — Other Ambulatory Visit (HOSPITAL_COMMUNITY): Payer: Self-pay

## 2022-06-13 DIAGNOSIS — Z952 Presence of prosthetic heart valve: Secondary | ICD-10-CM

## 2022-06-13 LAB — BASIC METABOLIC PANEL
Anion gap: 7 (ref 5–15)
BUN: 18 mg/dL (ref 8–23)
CO2: 22 mmol/L (ref 22–32)
Calcium: 8.5 mg/dL — ABNORMAL LOW (ref 8.9–10.3)
Chloride: 106 mmol/L (ref 98–111)
Creatinine, Ser: 1.04 mg/dL (ref 0.61–1.24)
GFR, Estimated: >60 mL/min (ref >60)
Glucose, Bld: 85 mg/dL (ref 70–99)
Potassium: 3.6 mmol/L (ref 3.5–5.1)
Sodium: 135 mmol/L (ref 135–145)

## 2022-06-13 MED ORDER — ISOSORB DINITRATE-HYDRALAZINE 20-37.5 MG PO TABS
1.0000 | ORAL_TABLET | Freq: Three times a day (TID) | ORAL | 0 refills | Status: DC
  Filled 2022-06-13: qty 90, 30d supply, fill #0

## 2022-06-13 MED ORDER — AMIODARONE HCL 200 MG PO TABS
200.0000 mg | ORAL_TABLET | Freq: Every day | ORAL | 0 refills | Status: DC
  Filled 2022-06-13: qty 30, 30d supply, fill #0

## 2022-06-13 MED ORDER — SPIRONOLACTONE 12.5 MG HALF TABLET
12.5000 mg | ORAL_TABLET | Freq: Once | ORAL | Status: AC
  Administered 2022-06-13: 12.5 mg via ORAL
  Filled 2022-06-13: qty 1

## 2022-06-13 MED ORDER — CARVEDILOL 6.25 MG PO TABS: 6.2500 mg | ORAL_TABLET | Freq: Two times a day (BID) | ORAL | Status: DC

## 2022-06-13 MED ORDER — CARVEDILOL 6.25 MG PO TABS
6.2500 mg | ORAL_TABLET | Freq: Two times a day (BID) | ORAL | 0 refills | Status: DC
  Filled 2022-06-13: qty 60, 30d supply, fill #0

## 2022-06-13 MED ORDER — SPIRONOLACTONE 25 MG PO TABS: 25.0000 mg | ORAL_TABLET | Freq: Every day | ORAL | Status: DC

## 2022-06-13 MED ORDER — SPIRONOLACTONE 25 MG PO TABS
25.0000 mg | ORAL_TABLET | Freq: Every day | ORAL | 0 refills | Status: DC
  Filled 2022-06-13: qty 30, 30d supply, fill #0

## 2022-06-13 MED ORDER — ISOSORB DINITRATE-HYDRALAZINE 20-37.5 MG PO TABS
1.0000 | ORAL_TABLET | Freq: Three times a day (TID) | ORAL | Status: DC
  Filled 2022-06-13 (×2): qty 1

## 2022-06-13 MED ORDER — POTASSIUM CHLORIDE CRYS ER 20 MEQ PO TBCR
40.0000 meq | EXTENDED_RELEASE_TABLET | Freq: Once | ORAL | Status: AC
  Administered 2022-06-13: 40 meq via ORAL
  Filled 2022-06-13: qty 2

## 2022-06-13 MED ORDER — AMIODARONE HCL 200 MG PO TABS: 200.0000 mg | ORAL_TABLET | Freq: Every day | ORAL | Status: DC

## 2022-06-13 MED ORDER — CARVEDILOL 3.125 MG PO TABS
3.1250 mg | ORAL_TABLET | Freq: Once | ORAL | Status: AC
  Administered 2022-06-13: 3.125 mg via ORAL
  Filled 2022-06-13: qty 1

## 2022-06-13 MED ORDER — MAGNESIUM SULFATE 2 GM/50ML IV SOLN
2.0000 g | Freq: Once | INTRAVENOUS | Status: AC
  Administered 2022-06-13: 2 g via INTRAVENOUS
  Filled 2022-06-13: qty 50

## 2022-06-13 MED ORDER — SACUBITRIL-VALSARTAN 97-103 MG PO TABS
1.0000 | ORAL_TABLET | Freq: Two times a day (BID) | ORAL | 0 refills | Status: DC
  Filled 2022-06-13: qty 60, 30d supply, fill #0

## 2022-06-13 NOTE — Progress Notes (Signed)
CARDIAC REHAB PHASE I   Reviewed ed with pt and significant other. Receptive. Declined ambulation, preparing for d/c.  1145-1150  Yves Dill BS, ACSM-CEP 06/13/2022 11:53 AM

## 2022-06-13 NOTE — Progress Notes (Addendum)
Advanced Heart Failure Rounding Note   Subjective:   Taken to cath lab 3/1 for RHC. Found to be in cardiogenic shock. [place] 06/03/22: RA 17 PA 72/41 (50) PCWP 30 (v=45) FICK CI 2.1 Thermo 1.6. Started on milrinone and IV lasix  Echo severe calcific aortic stenosis and severe central mitral regurgitation. EF 45-50%  S/p TAVR 3/7  Feels great. Wants to go home. Objective:   Weight Range:  Vital Signs:   Temp:  [98.1 F (36.7 C)-99.4 F (37.4 C)] 98.6 F (37 C) (03/11 0847) Pulse Rate:  [78-85] 81 (03/11 0847) Resp:  [17-22] 20 (03/11 0847) BP: (127-172)/(79-99) 172/93 (03/11 0847) SpO2:  [91 %-98 %] 98 % (03/11 0847) Weight:  [74.2 kg] 74.2 kg (03/11 0404) Last BM Date : 06/10/22  Weight change: Filed Weights   06/11/22 0522 06/12/22 0647 06/13/22 0404  Weight: 73.6 kg 74.6 kg 74.2 kg   Intake/Output:   Intake/Output Summary (Last 24 hours) at 06/13/2022 0955 Last data filed at 06/13/2022 0857 Gross per 24 hour  Intake 963 ml  Output 150 ml  Net 813 ml     Physical Exam  General:  Well appearing. No resp difficulty. Sitting on the side of the bed.  HEENT: normal Neck: supple. no JVD. Carotids 2+ bilat; no bruits. No lymphadenopathy or thryomegaly appreciated. Cor: PMI nondisplaced. Regular rate & rhythm. No rubs, gallops or murmurs. Lungs: clear Abdomen: soft, nontender, nondistended. No hepatosplenomegaly. No bruits or masses. Good bowel sounds. Extremities: no cyanosis, clubbing, rash, edema Neuro: alert & orientedx3, cranial nerves grossly intact. moves all 4 extremities w/o difficulty. Affect pleasant    Telemetry: SR 80-90s  Labs: Basic Metabolic Panel: Recent Labs  Lab 06/08/22 0520 06/09/22 0500 06/09/22 1313 06/10/22 0445 06/10/22 1500 06/11/22 0446 06/12/22 0120 06/13/22 0047  NA 138 138   < > 138 135 136 135 135  K 4.0 3.3*   < > 4.5 3.5 3.5 4.4 3.6  CL 104 104   < > 106 104 106 106 106  CO2 22 25  --  '23 25 25 '$ 19* 22  GLUCOSE 92 88   < >  96 157* 112* 102* 85  BUN 28* 21   < > '23 20 19 20 18  '$ CREATININE 1.39* 1.23   < > 1.35* 1.42* 1.27* 1.22 1.04  CALCIUM 8.8* 8.8*  --  8.8* 8.6* 8.5* 8.5* 8.5*  MG 1.8 2.2  --  1.8  --  1.8 1.7  --    < > = values in this interval not displayed.   Liver Function Tests: No results for input(s): "AST", "ALT", "ALKPHOS", "BILITOT", "PROT", "ALBUMIN" in the last 168 hours.  CBC: Recent Labs  Lab 06/09/22 0500 06/09/22 1313 06/09/22 1515 06/10/22 0445 06/11/22 0446 06/12/22 0120  WBC 3.8*  --   --  5.1 6.1 8.6  HGB 10.6* 12.9* 11.9* 10.6* 10.4* 10.6*  HCT 32.7* 38.0* 35.0* 32.0* 31.4* 31.6*  MCV 76.0*  --   --  75.8* 76.2* 74.7*  PLT 143*  --   --  127* 103* 107*   BNP: BNP (last 3 results) Recent Labs    05/31/22 1648 06/01/22 1629 06/02/22 0554  BNP 917.1* 670.6* 525.7*   Other results:  Imaging: No results found.  Medications:   Scheduled Medications:  allopurinol  100 mg Oral Daily   amiodarone  200 mg Oral BID   apixaban  5 mg Oral BID   atorvastatin  20 mg Oral Daily  carvedilol  3.125 mg Oral BID WC   isosorbide-hydrALAZINE  0.5 tablet Oral TID   sacubitril-valsartan  1 tablet Oral BID   sodium chloride flush  3 mL Intravenous Q12H   sodium chloride flush  3 mL Intravenous Q12H   spironolactone  12.5 mg Oral Daily   Infusions:  sodium chloride Stopped (06/08/22 1422)   sodium chloride Stopped (06/09/22 1421)   sodium chloride     sodium chloride     PRN Medications: sodium chloride, sodium chloride, acetaminophen **OR** acetaminophen, metoCLOPramide (REGLAN) injection, morphine injection, mouth rinse, oxyCODONE, sodium chloride flush, traMADol  Assessment/Plan:  1. A/c systolic HF -> cardiogenic shock - Echo 3/1/24LV EF 40-45% severe AS severe MR - Echo 11/23 EF 55% severe AS mild MR - Cath 3/23: LAD apical 90% D2 90% LCx 50%d RCA 40%p (Medical rx) - Admitted with marked volume overload. Diuresis complicated worsening renal function. CO2 16.  -  Suspect shock due to HF and not sepsis - [facility] 06/03/22: RA 17 PA 72/41 (50) PCWP 30 (v=45) FICK CI 2.1 Thermo 1.6. Started on milrinone and IV lasix - s/p TAVR 3/7 - Post-TAVR echo 06/09/21 EF 60-65% mod MR - Volume status stable.  - Continue Entresto to 97/103 bid - Increase spiro 25 mg daily  - Increase bidil  1 tab tid.   - Increase carvedilol 6.25 mg BID  - Would avoid SGLT2i with UTIs.   2. AS - Severe AS s/p TAVR 3/7 w/ Dr Burt Knack    3. MR  - Mod-Severe  - Post TAVR echo with moderate MR -> follow - Continue afterload reduction.   4. Chronic A fib. -> NSR - He is now back in NSR -Cut back amio 200 mg daily. Amio was being used for rate control.  Stop at post hospital visit.  - Continue eliquis 5 mg twice a day   5. CAD  - primarily non-obstructive with distal/branch vessel disease - no angina - continue medical management  6. AKI on CKD 3b due to ATN/shock - Baseline Scr 1.4-1.5  - Peak Scr 3.5  - Resolved  7. PVCs/NSVT - On amio 200 mg daily  - Keep K > 4.0 Mg > 2.0  8. HTN - See above.  - Adjusting HTN meds   9. H/O Septic Knee 01/2022  - Coag negative staph. Completed daptomycin x 4 weeks then switched to linezolid 02/20/22   10. UTI - UA large amount of leukocytes.  - Off farxiga would not rechallenge.  - s/p CTX  Will set up post hospital follow up. Will need HFSW team to follow outpatient. Previously living in a motel with 3 children.     Length of Stay: [place] NP-C  06/13/2022, 9:55 AM  Advanced Heart Failure Team Pager [phone] (M-F; 7a - 4p)  Please contact [facility] for night-coverage after hours (4p -7a ) and weekends on [email or web address]  Patient seen with NP, agree with the above note.   Echo on 3/8 showed EF 45-50% with moderate-severe MR and stable TAVR valve.  He feels good today with no complaints.   General: NAD Neck: No JVD, no thyromegaly or thyroid nodule.  Lungs: Clear to auscultation bilaterally with normal respiratory  effort. CV: Nondisplaced PMI.  Heart regular S1/S2, no S3/S4, 2/6 SEM RUSB.  No peripheral edema.   Abdomen: Soft, nontender, no hepatosplenomegaly, no distention.  Skin: Intact without lesions or rashes.  Neurologic: Alert and oriented x 3.  Psych: Normal affect. Extremities: No clubbing  or cyanosis.  HEENT: Normal.   Ok for home today.  Will get echo as outpatient in a couple months to follow MR, ?mTEER.  CHF clinic followup.  Meds for home: amiodarone 200 daily, apixaban 5 bid, atorva 20, Coreg 6.25 mg bid, Bidil 1 tab tid, spironolactone 25, Entresto 97/103 bid.   Loralie Champagne 06/13/2022 10:49 AM

## 2022-06-13 NOTE — Progress Notes (Signed)
Discharge instructions given. Patient verbalized and all questions were answered.

## 2022-06-13 NOTE — Discharge Summary (Signed)
Physician Discharge Summary   Patient: [name] MRN: [id number] DOB: 08/09/1960  Admit date:     06/01/2022  Discharge date: 06/13/22  Discharge Physician: [name] [name]   PCP: Shawn Stain, MD   Recommendations at discharge:    Patient has been placed on heart failure guideline medical therapy with Entresto, bidil, carvedilol and spironolactone.  Follow up echocardiogram in 3 months to evaluate mitral regurgitation.  Follow up renal function and electrolytes in 7 days. Follow up with Dr [name] in 7 to 10 days. Follow up with Cardiology as scheduled.   Discharge Diagnoses: Principal Problem:   Sepsis secondary to UTI [facility]) Active Problems:   Acute on chronic systolic CHF (congestive heart failure) (HCC)   Acute renal failure superimposed on chronic kidney disease (HCC)   HTN (hypertension)   Anemia   CAD (coronary artery disease)   Atrial fibrillation (HCC)   S/P TAVR (transcatheter aortic valve replacement)  Resolved Problems:   * No resolved hospital problems. [facility] Course: [name] was admitted to the hospital with the working diagnosis of heart failure decompensation.   62 yo male with the past medical history of aortic stenosis, coronary artery disease, CVA, CKD, and hx of right knee septic arthritis who presented with dyspnea. Patient has been off his medications for several months. He developed worsening dyspnea, lower extremity edema, chest and abdominal pain that prompted him to come to the hospital. On his initial physical examination his blood pressure was 83/69, HR 59, RR 30 and 02 saturation 94%, lungs with no wheezing or rales, heart with S1 and S2 present and rhythmic, abdomen with no distention, positive lower extremity edema.   Na 138, K 4,9 Cl 107 bicarbonate 14 glucose 104 bun 47 cr 2,32  BNP 917  High sensitive troponin 64 and 60  Wbc 3,9 hgb 11,4 plt 114   Chest radiograph with cardiomegaly, hilar vascular congestion, fluid in the  right fissure.  EKG 59 bpm, normal axis, normal intervals, atrial fibrillation rhythm with poor R R wave progression, with no significant ST segment or T wave changes.   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  Patient was placed on antibiotic therapy and diuretics.  -3/1, worsening kidney function, echo noted worsening systolic function with severe MR, CHF team consulted -Right heart cath 3/1, markedly elevated filling pressures, low cardiac output, started on Lasix gtt. and milrinone -3/2 night-Lasix GTT discontinued -3/3: Started Amio gtt., restarted milrinone -3/4, milrinone discontinued 3/5: Structural heart team consulting  03/07 TAVR  03/08 clinically stable, continue on IV amiodarone.  03/09 amiodarone changed to po. CVC removed.  03/10 patient clinically improved.  03/11 patient stable for discharge home and close follow up as outpatient.   Assessment and Plan: * Sepsis secondary to UTI ([place]) Klebsiella urine infection, severe sepsis present on admission.  Patient has been responding well to antibiotic therapy, he had Ceftriaxone IV and now transitioned to oral cephalexin. Completed 10 days of therapy.   Acute on chronic systolic CHF (congestive heart failure) (HCC) Echocardiogram with reduced Lv systolic function 45 to [id number] with severe concentric hypertrophy, RV systolic function preserved, LA and RA with severe dilatation, severe mitral valve regurgitation, severe low flow aortic valve stenosis.   03/01 cardiac catheterization.  PA 72/41 (50) PCWP = 30 PVR= 3.2  Cardiac output 4.3 and index 2.1   Pre and post capillary pulmonary hypertension.  Patient was placed  on inotropic (milrinone) support for low cardiac output heart failure.  Furosemide drip for diuresis.   Negative fluid balance was achieved, -11.776, with significant improvement of his symptoms. Patient was medically  optimized for heart failure and underwent TAVR with no major complications.   Patient will continue medical therapy for heart failure with carvedilol, bidil, spironolactone and entresto.  Follow up as outpatient, echocardiogram in 3 months to evaluate mitral regurgitation.   Acute renal failure superimposed on chronic kidney disease (HCC) CKD stage 3a. Hypokalemia.   Patient with improvement in his volume status, renal function at the time of his discharge with serum cr at 1,0 with K at 3,6 and serum bicarbonate at 22. NA 135.  Plan to continue diuresis with spironolactone.  Follow up renal function as outpatient.    Anemia Cell count has been stable with hgb at  10.6   HTN (hypertension) Continue blood pressure control with entresto, bidil, carvedilol, and spironolactone.   CAD (coronary artery disease) No  chest pain   Atrial fibrillation (HCC) Continue rate control with amiodarone and carvedilol.  Anticoagulation with apixaban         Consultants: cardiology  Procedures performed: as above, including cardiac catheterization, central line and TAVR   Disposition: Home Diet recommendation:  Cardiac diet DISCHARGE MEDICATION: Allergies as of 06/13/2022       Reactions   Adhesive [tape] Other (See Comments)   Makes the skin feel as if it is burning, will also bruise the skin. Pt. prefers paper tape   Latex Hives, Itching        Medication List     STOP taking these medications    cloNIDine 0.1 MG tablet Commonly known as: CATAPRES   diltiazem 240 MG 24 hr capsule Commonly known as: CARDIZEM CD   Farxiga 10 MG Tabs tablet Generic drug: dapagliflozin propanediol   ferrous sulfate 325 (65 FE) MG EC tablet   furosemide 40 MG tablet Commonly known as: LASIX   gabapentin 400 MG capsule Commonly known as: NEURONTIN   hydrALAZINE 25 MG tablet Commonly known as: APRESOLINE   olmesartan 40 MG tablet Commonly known as: BENICAR   potassium chloride SA 20  MEQ tablet Commonly known as: KLOR-CON M       TAKE these medications    acetaminophen 500 MG tablet Commonly known as: TYLENOL Take 500 mg by mouth 3 (three) times daily as needed for moderate pain.   allopurinol 100 MG tablet Commonly known as: ZYLOPRIM Take 1 tablet (100 mg total) by mouth daily.   amiodarone 200 MG tablet Commonly known as: PACERONE Take 1 tablet (200 mg total) by mouth daily. Start taking on: June 14, 2022   apixaban 5 MG Tabs tablet Commonly known as: ELIQUIS Take 1 tablet (5 mg total) by mouth 2 (two) times daily.   atorvastatin 20 MG tablet Commonly known as: LIPITOR Take 1 tablet (20 mg total) by mouth daily.   carvedilol 6.25 MG tablet Commonly known as: COREG Take 1 tablet (6.25 mg total) by mouth 2 (two) times daily with a meal. What changed:  medication strength how much to take   isosorbide-hydrALAZINE 20-37.5 MG tablet Commonly known as: BIDIL Take 1 tablet by mouth 3 (three) times daily.   omeprazole 40 MG capsule Commonly known as: PRILOSEC Take 1 capsule (40 mg total) by mouth daily.   sacubitril-valsartan 97-103 MG Commonly known as: ENTRESTO Take 1 tablet by mouth 2 (two) times daily.   spironolactone 25 MG tablet Commonly known as:  ALDACTONE Take 1 tablet (25 mg total) by mouth daily. Start taking on: June 14, 2022 What changed:  medication strength how much to take        Follow-up [place] [facility] [place] Follow up on 06/23/2022.   Specialty: Cardiology Why: at  2:00 Contact information: [address] [id number] [place] [id number]               Discharge Exam: Filed Weights   06/11/22 0522 06/12/22 0647 06/13/22 0404  Weight: 73.6 kg 74.6 kg 74.2 kg   BP (!) 167/99 (BP Location: Right Arm)   Pulse 77   Temp 98.3 F (36.8 C) (Oral)   Resp 20   Ht '5\' 9"'$  (1.753 m)   Wt 74.2 kg   SpO2 100%   BMI 24.14 kg/m    Patient is feeling better, no dyspnea, no edema, no orthopnea or PND  Neurology awake and alert ENT with mild pallor Cardiovascular with S1 and S2 present and irregular with no gallops, or rubs, positive murmur at the apex No JVD No lower extremity edema Respiratory with no rales or wheezing Abdomen with no distention   Condition at discharge: stable  The results of significant diagnostics from this hospitalization (including imaging, microbiology, ancillary and laboratory) are listed below for reference.   Imaging Studies: ECHOCARDIOGRAM COMPLETE  Result Date: 06/11/2022    ECHOCARDIOGRAM REPORT   Patient Name:   [name] Date of Exam: 06/10/2022 Medical Rec #:  [id number]  Height:       69.0 in Accession #:    [id number] Weight:       166.5 lb Date of Birth:  19-Sep-1960  BSA:          1.911 m Patient Age:    61 years   BP:           144/80 mmHg Patient Gender: M          HR:           80 bpm. Exam Location:  Inpatient Procedure: 2D Echo, Cardiac Doppler and Color Doppler Indications:    Post TAVR evaluation V43.3/Z95.2  History:        Patient has prior history of Echocardiogram examinations, most                 recent 06/09/2022. CHF, CAD, PAD and Stroke, Arrythmias:Atrial                 Fibrillation, Signs/Symptoms:Chest Pain; Risk                 Factors:Hypertension and Sleep Apnea. CKD, stage 3.                 Aortic Valve: 29 mm a valve is present in the aortic position.                 Procedure Date: 06/09/22.  Sonographer:    Ronny Flurry Referring Phys: [place]  1. The aortic valve has been replaced with a 29 mm Sapien Valve. Aortic valve regurgitation is at most mild, paravalvular and adjacent to the intraventricular septum. There is a 29 mm a valve present in the aortic position. Procedure Date: 06/09/22. Mean gradient 10 m Hg, Peak gradient 19 mm Hg, DVI 0.54 and EOA 1.53 cm2.  2. Left ventricular ejection fraction, by estimation, is 45 to 50%. The left  ventricle has mildly decreased function.  The left ventricle demonstrates global hypokinesis. There is severe concentric left ventricular hypertrophy. Left ventricular diastolic  function could not be evaluated.  3. Right ventricular systolic function is normal. The right ventricular size is normal.  4. Left atrial size was severely dilated.  5. Right atrial size was severely dilated.  6. The mitral valve is abnormal. Moderate to severe mitral valve regurgitation. No evidence of mitral stenosis.  7. Aortic dilatation noted. There is mild dilatation of the ascending aorta, measuring 40 mm.  8. The inferior vena cava is normal in size with greater than 50% respiratory variability, suggesting right atrial pressure of 3 mmHg. Comparison(s): Prior images reviewed side by side. Compared to prior, PVL is new. FINDINGS  Left Ventricle: Left ventricular ejection fraction, by estimation, is 45 to 50%. The left ventricle has mildly decreased function. The left ventricle demonstrates global hypokinesis. The left ventricular internal cavity size was normal in size. There is  severe concentric left ventricular hypertrophy. Left ventricular diastolic function could not be evaluated due to mitral regurgitation (moderate or greater). Left ventricular diastolic function could not be evaluated. Right Ventricle: The right ventricular size is normal. No increase in right ventricular wall thickness. Right ventricular systolic function is normal. Left Atrium: Left atrial size was severely dilated. Right Atrium: Right atrial size was severely dilated. Pericardium: Trivial pericardial effusion is present. Mitral Valve: The mitral valve is abnormal. Moderate to severe mitral valve regurgitation. No evidence of mitral valve stenosis. Tricuspid Valve: The tricuspid valve is normal in structure. Tricuspid valve regurgitation is mild. Aortic Valve: The aortic valve has been repaired/replaced. Aortic valve regurgitation is mild. Aortic valve mean  gradient measures 10.0 mmHg. Aortic valve peak gradient measures 13.9 mmHg. Aortic valve area, by VTI measures 1.97 cm. There is a 29 mm a valve present in the aortic position. Procedure Date: 06/09/22. Pulmonic Valve: The pulmonic valve was not well visualized. Pulmonic valve regurgitation is not visualized. No evidence of pulmonic stenosis. Aorta: Aortic dilatation noted. There is mild dilatation of the ascending aorta, measuring 40 mm. Venous: The inferior vena cava is normal in size with greater than 50% respiratory variability, suggesting right atrial pressure of 3 mmHg. IAS/Shunts: No atrial level shunt detected by color flow Doppler.  LEFT VENTRICLE PLAX 2D LVIDd:         4.60 cm   Diastology LVIDs:         2.90 cm   LV e' medial:    6.96 cm/s LV PW:         1.90 cm   LV E/e' medial:  15.7 LV IVS:        1.30 cm   LV e' lateral:   8.59 cm/s LVOT diam:     1.90 cm   LV E/e' lateral: 12.7 LV SV:         55 LV SV Index:   29 LVOT Area:     2.84 cm  RIGHT VENTRICLE             IVC RV S prime:     14.40 cm/s  IVC diam: 1.70 cm TAPSE (M-mode): 1.8 cm LEFT ATRIUM              Index        RIGHT ATRIUM           Index LA diam:        5.60 cm  2.93 cm/m   RA Area:     29.40 cm LA Vol (A2C):  134.0 ml 70.11 ml/m  RA Volume:   93.40 ml  48.87 ml/m LA Vol (A4C):   112.0 ml 58.60 ml/m LA Biplane Vol: 122.0 ml 63.84 ml/m  AORTIC VALVE AV Area (Vmax):    1.97 cm AV Area (Vmean):   2.00 cm AV Area (VTI):     1.97 cm AV Vmax:           186.25 cm/s AV Vmean:          114.700 cm/s AV VTI:            0.280 m AV Peak Grad:      13.9 mmHg AV Mean Grad:      10.0 mmHg LVOT Vmax:         129.33 cm/s LVOT Vmean:        80.867 cm/s LVOT VTI:          0.194 m LVOT/AV VTI ratio: 0.69  AORTA Ao Root diam: 3.60 cm MITRAL VALVE                TRICUSPID VALVE MV Area (PHT): 4.60 cm     TR Peak grad:   41.0 mmHg MV Decel Time: 165 msec     TR Vmax:        320.00 cm/s MR Peak grad: 115.8 mmHg MR Mean grad: 68.0 mmHg     SHUNTS MR  Vmax:      538.00 cm/s   Systemic VTI:  0.19 m MR Vmean:     376.0 cm/s    Systemic Diam: 1.90 cm MV E velocity: 109.00 cm/s MV A velocity: 42.10 cm/s MV E/A ratio:  2.59 Rudean Haskell MD Electronically signed by Rudean Haskell MD Signature Date/Time: 06/11/2022/2:05:56 PM    Final    ECHOCARDIOGRAM LIMITED  Result Date: 06/09/2022    ECHOCARDIOGRAM LIMITED REPORT   Patient Name:   [name] Date of Exam: 06/09/2022 Medical Rec #:  [id number]  Height:       69.0 in Accession #:    [id number] Weight:       162.0 lb Date of Birth:  12-25-1960  BSA:          1.889 m Patient Age:    84 years   BP:           157/117 mmHg Patient Gender: M          HR:           89 bpm. Exam Location:  Inpatient Procedure: Limited Echo, Color Doppler and Cardiac Doppler Indications:     Aortic Stenosis i35.0  History:         Patient has prior history of Echocardiogram examinations, most                  recent 06/03/2022. Risk Factors:Hypertension, Dyslipidemia and                  Sleep Apnea.  Sonographer:     Raquel Sarna Senior RDCS Referring Phys:  [phone] JILL D MCDANIEL Diagnosing Phys: Rudean Haskell MD  Sonographer Comments: TAVR IMPRESSIONS  1. Interventional echo for TAVR procedure.  2. Prior to procedure, mixed aortic valve disease. Mild aortic regurgitation and severe aortic stenosis. Despite low stroke volume mean gradient 40 mm Hg at maximum, peak gradient 67 mmHg. DVI 0.27.  3. After procedure 29 mm Sapien Valve was placed. There was no AI (no PVL). Mean gradient was 3 mm Hg, peak gradient 5 mm HG. Effective orifice area 3.73 cm2.  4. Effusion unchanged through the procedure  5. Left ventricular ejection fraction, by estimation, is 45%. The left ventricle has mildly decreased function. There is severe concentric left ventricular hypertrophy.  6. Right ventricular systolic function is normal.  7. The mitral valve is abnormal. Moderate to severe mitral valve regurgitation. No evidence of mitral stenosis. MR is seen  at worst in clip 16.  8. Tricuspid valve regurgitation is mild to moderate. Comparison(s): Succesful TAVR Placement. FINDINGS  Left Ventricle: Left ventricular ejection fraction, by estimation, is 45%. The left ventricle has mildly decreased function. There is severe concentric left ventricular hypertrophy. Right Ventricle: Right ventricular systolic function is normal. Pericardium: Trivial pericardial effusion is present. The pericardial effusion is circumferential. Mitral Valve: The mitral valve is abnormal. Moderate to severe mitral valve regurgitation. No evidence of mitral valve stenosis. Tricuspid Valve: Tricuspid valve regurgitation is mild to moderate. Aortic Valve: Aortic valve mean gradient measures 19.5 mmHg. Aortic valve peak gradient measures 24.6 mmHg. Aortic valve area, by VTI measures 1.21 cm. Additional Comments: Spectral Doppler performed. Color Doppler performed.  LEFT VENTRICLE PLAX 2D LVOT diam:     2.40 cm LV SV:         60 LV SV Index:   32 LVOT Area:     4.52 cm  AORTIC VALVE AV Area (Vmax):    1.42 cm AV Area (Vmean):   1.42 cm AV Area (VTI):     1.21 cm AV Vmax:           248.00 cm/s AV Vmean:          184.800 cm/s AV VTI:            0.495 m AV Peak Grad:      24.6 mmHg AV Mean Grad:      19.5 mmHg LVOT Vmax:         77.83 cm/s LVOT Vmean:        57.867 cm/s LVOT VTI:          0.132 m LVOT/AV VTI ratio: 0.27  SHUNTS Systemic VTI:  0.13 m Systemic Diam: 2.40 cm Rudean Haskell MD Electronically signed by Rudean Haskell MD Signature Date/Time: 06/09/2022/6:23:12 PM    Final    Structural Heart Procedure  Result Date: 06/09/2022 See surgical note for result.  DG Chest 2 View  Result Date: 06/08/2022 CLINICAL DATA:  Preop EXAM: CHEST - 2 VIEW COMPARISON:  X-Roen 06/02/2022 FINDINGS: Right IJ line in place with tip along the central SVC right atrial junction region. No pneumothorax. Enlarged cardiopericardial silhouette. No pneumothorax. Tiny bilateral pleural effusions. No  edema. Overlapping cardiac leads. IMPRESSION: Tiny pleural effusions. Right IJ catheter.  No pneumothorax Electronically Signed   By: Jill Side M.D.   On: 06/08/2022 18:32   CARDIAC CATHETERIZATION  Result Date: 06/03/2022 Findings: RA = 17 RV = 65/17 PA = 72/41 (50) PCW = 30 (v=45) Fick cardiac output/index = 4.3/2.1 Thermo CO/CI 3.3/1.6 PVR = 3.2 (Fick) 4.2 (Thermo) Ao sat = 95% PA sat = 56%, 56% Assessment: 1. Markedly elevated filling pressures with low cardiac output 2. Prominent v- waves in PCWP tracing c/w severe MR 3. Mixed pulmonary HTN Plan/Discussion: Start milrinone and lasix gtt. Glori Bickers, MD 6:07 PM  ECHOCARDIOGRAM COMPLETE  Result Date: 06/03/2022    ECHOCARDIOGRAM REPORT   Patient Name:   [name] Date of Exam: 06/03/2022 Medical Rec #:  [id number]  Height:       69.0 in Accession #:    [id number] Weight:  187.4 lb Date of Birth:  Aug 25, 1960  BSA:          2.010 m Patient Age:    76 years   BP:           154/131 mmHg Patient Gender: M          HR:           98 bpm. Exam Location:  Inpatient Procedure: 2D Echo, Cardiac Doppler and Color Doppler Indications:    CHF-Acute Diastolic [id number]  History:        Patient has prior history of Echocardiogram examinations, most                 recent 02/19/2022. CHF, CAD, PAD, Arrythmias:Atrial                 Fibrillation, Signs/Symptoms:Syncope and Chest Pain; Risk                 Factors:Hypertension, Sleep Apnea and Dyslipidemia. CKD, stage                 3.  Sonographer:    Ronny Flurry Referring Phys: [id number] VASUNDHRA RATHORE IMPRESSIONS  1. Left ventricular ejection fraction, by estimation, is 45 to 50%. The left ventricle has mildly decreased function. Left ventricular endocardial border not optimally defined to evaluate regional wall motion. There is severe concentric left ventricular  hypertrophy. Left ventricular diastolic function could not be evaluated.  2. Right ventricular systolic function was not well visualized. The  right ventricular size is normal. There is mildly elevated pulmonary artery systolic pressure. The estimated right ventricular systolic pressure is Q000111Q mmHg.  3. Left atrial size was severely dilated.  4. Right atrial size was severely dilated.  5. The mitral valve is degenerative. is severe and central in nature mitral valve regurgitation. No evidence of mitral stenosis.  6. Tricuspid valve regurgitation is mild to moderate.  7. The aortic valve is calcified. There is severe calcifcation of the aortic valve. Aortic valve regurgitation is mild. Severe low flow low gradient aortic valve stenosis with mean gradient 32 mm Hg and DVI 0.22.  8. Aortic dilatation noted. There is mild dilatation of the ascending aorta, measuring 40 mm.  9. The inferior vena cava is normal in size with <50% respiratory variability, suggesting right atrial pressure of 8 mmHg. Comparison(s): Prior images reviewed side by side. Mitral regurgitation has increased. FINDINGS  Left Ventricle: Left ventricular ejection fraction, by estimation, is 45 to 50%. The left ventricle has mildly decreased function. Left ventricular endocardial border not optimally defined to evaluate regional wall motion. The left ventricular internal cavity size was normal in size. There is severe concentric left ventricular hypertrophy. Left ventricular diastolic function could not be evaluated due to mitral regurgitation (moderate or greater). Left ventricular diastolic function could not be evaluated. Right Ventricle: The right ventricular size is normal. Right vetricular wall thickness was not well visualized. Right ventricular systolic function was not well visualized. There is mildly elevated pulmonary artery systolic pressure. The tricuspid regurgitant velocity is 2.66 m/s, and with an assumed right atrial pressure of 8 mmHg, the estimated right ventricular systolic pressure is Q000111Q mmHg. Left Atrium: Left atrial size was severely dilated. Right Atrium: Right  atrial size was severely dilated. Pericardium: There is no evidence of pericardial effusion. Mitral Valve: The mitral valve is degenerative in appearance. Is severe and central in nature mitral valve regurgitation. No evidence of mitral valve stenosis. Tricuspid Valve: The tricuspid valve is grossly normal.  Tricuspid valve regurgitation is mild to moderate. No evidence of tricuspid stenosis. Aortic Valve: The aortic valve is calcified. There is severe calcifcation of the aortic valve. There is mild aortic valve annular calcification. Aortic valve regurgitation is mild. Aortic regurgitation PHT measures 746 msec. Severe aortic stenosis is present. Aortic valve mean gradient measures 32.0 mmHg. Aortic valve peak gradient measures 52.4 mmHg. Aortic valve area, by VTI measures 0.82 cm. Pulmonic Valve: The pulmonic valve was normal in structure. Pulmonic valve regurgitation is trivial. Aorta: Aortic dilatation noted. There is mild dilatation of the ascending aorta, measuring 40 mm. Venous: The inferior vena cava is normal in size with less than 50% respiratory variability, suggesting right atrial pressure of 8 mmHg. IAS/Shunts: No atrial level shunt detected by color flow Doppler.  LEFT VENTRICLE PLAX 2D LVIDd:         4.50 cm   Diastology LVIDs:         3.20 cm   LV e' medial:    9.36 cm/s LV PW:         1.80 cm   LV E/e' medial:  14.4 LV IVS:        1.30 cm   LV e' lateral:   8.92 cm/s LVOT diam:     2.20 cm   LV E/e' lateral: 15.1 LV SV:         53 LV SV Index:   27 LVOT Area:     3.80 cm  RIGHT VENTRICLE            IVC RV S prime:     5.66 cm/s  IVC diam: 2.50 cm TAPSE (M-mode): 1.6 cm LEFT ATRIUM              Index        RIGHT ATRIUM           Index LA diam:        5.40 cm  2.69 cm/m   RA Area:     29.35 cm LA Vol (A2C):   135.0 ml 67.18 ml/m  RA Volume:   97.20 ml  48.37 ml/m LA Vol (A4C):   92.5 ml  46.03 ml/m LA Biplane Vol: 112.0 ml 55.73 ml/m  AORTIC VALVE AV Area (Vmax):    0.95 cm AV Area (Vmean):    0.90 cm AV Area (VTI):     0.82 cm AV Vmax:           362.00 cm/s AV Vmean:          249.000 cm/s AV VTI:            0.652 m AV Peak Grad:      52.4 mmHg AV Mean Grad:      32.0 mmHg LVOT Vmax:         90.80 cm/s LVOT Vmean:        58.967 cm/s LVOT VTI:          0.140 m LVOT/AV VTI ratio: 0.22 AI PHT:            746 msec  AORTA Ao Root diam: 3.30 cm Ao Asc diam:  4.00 cm MR Peak grad:    119.7 mmHg   TRICUSPID VALVE MR Mean grad:    77.0 mmHg    TR Peak grad:   28.3 mmHg MR Vmax:         547.00 cm/s  TR Vmax:        266.00 cm/s MR Vmean:  412.0 cm/s MR PISA:         9.05 cm     SHUNTS MR PISA Eff ROA: 51 mm       Systemic VTI:  0.14 m MR PISA Radius:  1.20 cm      Systemic Diam: 2.20 cm MV E velocity: 135.00 cm/s Rudean Haskell MD Electronically signed by Rudean Haskell MD Signature Date/Time: 06/03/2022/11:50:37 AM    Final    DG Chest 1 View  Result Date: 06/03/2022 CLINICAL DATA:  Shortness of breath EXAM: CHEST  1 VIEW COMPARISON:  Radiograph and CT 06/01/2022 FINDINGS: Stable cardiomegaly. Aortic atherosclerotic calcification. No focal consolidation, pleural effusion, or pneumothorax. No acute osseous abnormality. IMPRESSION: Stable cardiomegaly. No acute cardiopulmonary process. Electronically Signed   By: Placido Sou M.D.   On: 06/03/2022 02:14   US Abdomen Limited  Result Date: 06/01/2022 CLINICAL DATA:  Gallbladder wall thickening on recent abdomen and pelvis CT. EXAM: ULTRASOUND ABDOMEN LIMITED RIGHT UPPER QUADRANT COMPARISON:  None Available. FINDINGS: Gallbladder: A large amount of echogenic sludge is seen within the gallbladder lumen, without evidence of gallstones. Gallbladder wall measures 4.3 mm in thickness. A mild amount of pericholecystic fluid is also seen. No sonographic Murphy sign noted by sonographer. Common bile duct: Diameter: 3.8 mm Liver: No focal lesion identified. Within normal limits in parenchymal echogenicity. Portal vein is patent on color Doppler  imaging with normal direction of blood flow towards the liver. Other: Of incidental note is the presence of a right pleural effusion and a mild amount of ascites. IMPRESSION: 1. Gallbladder sludge and gallbladder wall thickening, which may be secondary to the presence of ascites. 2. Right pleural effusion. Electronically Signed   By: Virgina Norfolk M.D.   On: 06/01/2022 23:14   CT CHEST ABDOMEN PELVIS WO CONTRAST  Addendum Date: 06/01/2022   ADDENDUM REPORT: 06/01/2022 20:28 ADDENDUM: Critical findings were reported to Dr. Johnney Killian at 8:27 p.m. Electronically Signed   By: Brett Fairy M.D.   On: 06/01/2022 20:28   Result Date: 06/01/2022 CLINICAL DATA:  Dyspnea, chronic, unclear etiology. Mental status change. EXAM: CT CHEST, ABDOMEN AND PELVIS WITHOUT CONTRAST TECHNIQUE: Multidetector CT imaging of the chest, abdomen and pelvis was performed following the standard protocol without IV contrast. RADIATION DOSE REDUCTION: This exam was performed according to the departmental dose-optimization program which includes automated exposure control, adjustment of the mA and/or kV according to patient size and/or use of iterative reconstruction technique. COMPARISON:  01/14/2022. FINDINGS: CT CHEST FINDINGS Cardiovascular: The heart is enlarged and there is no pericardial effusion. Three-vessel coronary artery calcifications are noted. There is atherosclerotic calcification of the aorta without evidence of aneurysm. The pulmonary trunk is normal in caliber. Mediastinum/Nodes: Enlarged lymph nodes are present in the mediastinum measuring up to 1.1 cm in short axis diameter in the precarinal space. No axillary lymphadenopathy. Evaluation of the hila is limited due to lack of IV contrast. The thyroid gland, trachea, and esophagus are within normal limits. Lungs/Pleura: There is a small right pleural effusion and trace left pleural effusion. Paraseptal and centrilobular emphysematous changes are present in the lungs.  There is hazy ground-glass attenuation in the lungs bilaterally with a central predominance. Atelectasis is present bilaterally. Cluster of nodules is noted in the right middle lobe, the largest measuring 6 mm, axial image 87. No pneumothorax. Musculoskeletal: No acute or suspicious osseous abnormality. CT ABDOMEN PELVIS FINDINGS Hepatobiliary: No focal liver abnormality is seen. No gallstones. There is questionable gallbladder wall thickening versus pericholecystic edema. Pancreas: Unremarkable.  No pancreatic ductal dilatation or surrounding inflammatory changes. Spleen: Normal in size without focal abnormality. Adrenals/Urinary Tract: The adrenal glands are within normal limits. There is a cyst in the lower pole of the right kidney. No renal calculus or hydronephrosis. 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 Stomach/Bowel: Stomach is within normal limits. Appendix appears normal. No evidence of bowel wall thickening, distention, or inflammatory changes. No free air or pneumatosis. Few scattered diverticula are present along the colon without evidence of diverticulitis. Vascular/Lymphatic: Aortic atherosclerosis. Prominent lymph nodes are noted in the retroperitoneum measuring up to 1 cm in short axis diameter. There are enlarged lymph nodes in the inguinal regions bilaterally measuring up to 1.8 cm on the left. 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 Reproductive: Prostate is unremarkable. Other: Mild ascites in all 4 quadrants and mesenteric fat stranding. Fat containing inguinal hernias are noted bilaterally fat containing umbilical hernia is present. Anasarca is noted. Musculoskeletal: No acute osseous abnormality. IMPRESSION: 1. 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 2. Hazy ground-glass opacities  in the lungs bilaterally with a central predominance, possible edema or infiltrate. 3. Cluster of nodules in the right middle lobe measuring up to 6 mm, which may be infectious or inflammatory. Non-contrast chest CT at 3-6 months is recommended. If the nodules are stable at time of repeat CT, then future CT at 18-24 months (from today's scan) is considered optional for low-risk patients, but is recommended for high-risk patients. This recommendation follows the consensus statement: Guidelines for Management of Incidental Pulmonary Nodules Detected on CT Images: From the Fleischner Society 2017; Radiology 2017; 284:228-243. 4. Small right pleural effusion and trace left pleural effusion. 5. Emphysema. 6. Mild ascites and anasarca. 7. Suggestion of gallbladder wall thickening versus pericholecystic edema which may be associated with local inflammatory changes or edema. Consider ultrasound for further evaluation. 8. 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 9. Nonspecific lymphadenopathy in the retroperitoneum and inguinal regions bilaterally, unchanged from the prior exam. 10. Aortic atherosclerosis and multi-vessel coronary artery calcifications. 11. Remaining incidental findings as described above. Electronically Signed: By: Brett Fairy M.D. On: 06/01/2022 20:24   CT Head Wo Contrast  Result Date: 06/01/2022 CLINICAL DATA:  Mental status change EXAM: CT HEAD WITHOUT CONTRAST TECHNIQUE: Contiguous axial images were obtained from the base of the skull through the vertex without intravenous contrast. RADIATION DOSE REDUCTION: This exam was performed according to the departmental dose-optimization program which includes automated exposure control, adjustment of the mA and/or kV according to patient size and/or use of iterative reconstruction technique. COMPARISON:  Head CT 03/24/2022 FINDINGS: Brain: No evidence of acute infarction, hemorrhage, hydrocephalus, extra-axial  collection or mass lesion/mass effect. There is stable moderate periventricular and deep white matter hypodensity, likely chronic small vessel ischemic change. There is an old lacunar infarct in the right basal ganglia. Vascular: Atherosclerotic calcifications are present within the cavernous internal carotid arteries. Skull: Normal. Negative for fracture or focal lesion. Sinuses/Orbits: No acute finding. Other: None. IMPRESSION: 1. No acute intracranial process. 2. Stable moderate chronic small vessel ischemic change. 3. 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 Electronically Signed   By: Ronney Asters M.D.   On: 06/01/2022 20:13   DG Chest 1 View  Result Date: 06/01/2022 CLINICAL DATA:  Chest pain EXAM: CHEST  1 VIEW COMPARISON:  X-[place] 03/24/2022 FINDINGS: Enlarged cardiopericardial silhouette. No consolidation, pneumothorax or effusion. No edema. Calcified aorta. IMPRESSION: Enlarged cardiopericardial silhouette. Electronically Signed   By: Jill Side M.D.   On: 06/01/2022 16:25    Microbiology: Results for orders placed or performed during the hospital encounter of 06/01/22  Resp panel by RT-PCR (RSV, Flu A&B, Covid) Anterior Nasal Swab     Status: None   Collection Time: 06/02/22  1:40 AM   Specimen: Anterior Nasal Swab  Result Value Ref Range Status   SARS Coronavirus 2 by RT PCR NEGATIVE NEGATIVE Final   Influenza A by PCR NEGATIVE NEGATIVE Final   Influenza B by PCR NEGATIVE NEGATIVE Final    Comment: (NOTE) The Xpert Xpress SARS-CoV-2/FLU/RSV plus assay is intended as an aid in the diagnosis of influenza from Nasopharyngeal swab specimens and should not be used as a sole basis for treatment. Nasal washings and aspirates are unacceptable for Xpert Xpress SARS-CoV-2/FLU/RSV testing.  Fact Sheet for Patients: EntrepreneurPulse.com.au  Fact Sheet for Healthcare Providers: IncredibleEmployment.be  This test is not yet approved or cleared by  the [place] FDA and has been authorized for detection and/or diagnosis of SARS-CoV-2 by FDA under an Emergency Use Authorization (EUA). This EUA will remain in effect (meaning this test can be used) for the duration of the COVID-19 declaration under Section 564(b)(1) of the Act, 21 U.S.C. section 360bbb-3(b)(1), unless the authorization is terminated or revoked.     Resp Syncytial Virus by PCR NEGATIVE NEGATIVE Final    Comment: (NOTE) Fact Sheet for Patients: EntrepreneurPulse.com.au  Fact Sheet for Healthcare Providers: IncredibleEmployment.be  This test is not yet approved or cleared by the [place] FDA and has been authorized for detection and/or diagnosis of SARS-CoV-2 by FDA under an Emergency Use Authorization (EUA). This EUA will remain in effect (meaning this test can be used) for the duration of the COVID-19 declaration under Section 564(b)(1) of the Act, 21 U.S.C. section 360bbb-3(b)(1), unless the authorization is terminated or revoked.  Performed at [facility], [place] [address] [place], [place] [address]   Culture, blood (routine x 2)     Status: None   Collection Time: 06/02/22  1:49 AM   Specimen: BLOOD LEFT ARM  Result Value Ref Range Status   Specimen Description BLOOD LEFT ARM  Final   Special Requests   Final    BOTTLES DRAWN AEROBIC AND ANAEROBIC Blood Culture results may not be optimal due to an inadequate volume of blood received in culture bottles   Culture   Final    NO GROWTH 5 DAYS Performed at [facility], [place] [address] [place], [place] [address]    Report Status 06/07/2022 FINAL  Final  MRSA Next Gen by PCR, Nasal     Status: None   Collection Time: 06/02/22  2:55 AM   Specimen: Nasal Mucosa; Nasal Swab  Result Value Ref Range Status   MRSA by PCR Next Gen NOT DETECTED NOT DETECTED Final    Comment: (NOTE) The GeneXpert MRSA Assay (FDA approved for NASAL specimens only), is one  component of a comprehensive MRSA colonization surveillance program. It is not intended to diagnose MRSA infection nor to guide or monitor treatment for MRSA infections. Test performance is not FDA approved in patients less than 68 years old. Performed at [facility], [place] [address] [place], [place] [address]   Urine Culture (for pregnant, neutropenic or urologic patients or patients with an indwelling  urinary catheter)     Status: Abnormal   Collection Time: 06/02/22  1:18 PM   Specimen: Urine, Clean Catch  Result Value Ref Range Status   Specimen Description URINE, CLEAN CATCH  Final   Special Requests   Final    NONE Performed at [facility], [address], [place], [place] [address]    Culture >=100,000 COLONIES/mL KLEBSIELLA PNEUMONIAE (A)  Final   Report Status 06/05/2022 FINAL  Final   Organism ID, Bacteria KLEBSIELLA PNEUMONIAE (A)  Final      Susceptibility   Klebsiella pneumoniae - MIC*    AMPICILLIN >=32 RESISTANT Resistant     CEFAZOLIN <=4 SENSITIVE Sensitive     CEFEPIME <=0.12 SENSITIVE Sensitive     CEFTRIAXONE <=0.25 SENSITIVE Sensitive     CIPROFLOXACIN <=0.25 SENSITIVE Sensitive     GENTAMICIN <=1 SENSITIVE Sensitive     IMIPENEM <=0.25 SENSITIVE Sensitive     NITROFURANTOIN 128 RESISTANT Resistant     TRIMETH/SULFA <=20 SENSITIVE Sensitive     AMPICILLIN/SULBACTAM 16 INTERMEDIATE Intermediate     PIP/TAZO 16 SENSITIVE Sensitive     * >=100,000 COLONIES/mL KLEBSIELLA PNEUMONIAE    Labs: CBC: Recent Labs  Lab 06/09/22 0500 06/09/22 1313 06/09/22 1515 06/10/22 0445 06/11/22 0446 06/12/22 0120  WBC 3.8*  --   --  5.1 6.1 8.6  HGB 10.6* 12.9* 11.9* 10.6* 10.4* 10.6*  HCT 32.7* 38.0* 35.0* 32.0* 31.4* 31.6*  MCV 76.0*  --   --  75.8* 76.2* 74.7*  PLT 143*  --   --  127* 103* [id number]*   Basic Metabolic Panel: Recent Labs  Lab 06/08/22 0520 06/09/22 0500 06/09/22 1313 06/10/22 0445 06/10/22 1500 06/11/22 0446 06/12/22 0120  06/13/22 0047  NA 138 138   < > 138 135 136 135 135  K 4.0 3.3*   < > 4.5 3.5 3.5 4.4 3.6  CL 104 104   < > 106 104 106 106 106  CO2 22 25  --  '23 25 25 '$ 19* 22  GLUCOSE 92 88   < > 96 157* 112* 102* 85  BUN 28* 21   < > '23 20 19 20 18  '$ CREATININE 1.39* 1.23   < > 1.35* 1.42* 1.27* 1.22 1.04  CALCIUM 8.8* 8.8*  --  8.8* 8.6* 8.5* 8.5* 8.5*  MG 1.8 2.2  --  1.8  --  1.8 1.7  --    < > = values in this interval not displayed.   Liver Function Tests: No results for input(s): "AST", "ALT", "ALKPHOS", "BILITOT", "PROT", "ALBUMIN" in the last 168 hours. CBG: No results for input(s): "GLUCAP" in the last 168 hours.  Discharge time spent: greater than 30 minutes.  Signed: Tawni Millers, MD [facility] 06/13/2022

## 2022-06-14 ENCOUNTER — Telehealth: Payer: Self-pay

## 2022-06-14 NOTE — Telephone Encounter (Signed)
TOC call attempt #1  Left message for pt to contact the office.

## 2022-06-14 NOTE — Transitions of Care (Post Inpatient/ED Visit) (Signed)
   06/14/2022  Name: Jeanette Moffatt MRN: [id number] DOB: 29-Nov-1960  Today's TOC FU Call Status: Today's TOC FU Call Status:: Unsuccessul Call (1st Attempt) Unsuccessful Call (1st Attempt) Date: 06/14/22  Attempted to reach the patient regarding the most recent Inpatient/ED visit.  Follow Up Plan: Additional outreach attempts will be made to reach the patient to complete the Transitions of Care (Post Inpatient/ED visit) call.     Enzo Montgomery, RN,BSN,CCM [facility] Care Management Care Management Community Coordinator Direct Phone: [phone] Toll Free: [phone] Fax: [phone]

## 2022-06-15 ENCOUNTER — Telehealth: Payer: Self-pay

## 2022-06-15 ENCOUNTER — Telehealth: Payer: Self-pay | Admitting: *Deleted

## 2022-06-15 ENCOUNTER — Ambulatory Visit (INDEPENDENT_AMBULATORY_CARE_PROVIDER_SITE_OTHER): Payer: 59 | Admitting: Primary Care

## 2022-06-15 ENCOUNTER — Telehealth (INDEPENDENT_AMBULATORY_CARE_PROVIDER_SITE_OTHER): Payer: Self-pay | Admitting: Primary Care

## 2022-06-15 NOTE — Transitions of Care (Post Inpatient/ED Visit) (Signed)
   06/15/2022  Name: [name] MRN: [id number] DOB: 02/04/1961  Today's TOC FU Call Status: Today's TOC FU Call Status:: Unsuccessful Call (3rd Attempt) Unsuccessful Call (3rd Attempt) Date: 06/15/22  Attempted to reach the patient regarding the most recent Inpatient/ED visit.  Follow Up Plan: No further outreach attempts will be made at this time. We have been unable to contact the patient.    Enzo Montgomery, RN,BSN,CCM [facility] Care Management Care Management Community Coordinator Direct Phone: [phone] Toll Free: [phone] Fax: [phone]

## 2022-06-15 NOTE — Transitions of Care (Post Inpatient/ED Visit) (Signed)
   06/15/2022  Name: Derreck Wiltsey MRN: [id number] DOB: 1961-03-05  Today's TOC FU Call Status: Today's TOC FU Call Status:: Unsuccessful Call (2nd Attempt) Unsuccessful Call (2nd Attempt) Date: 06/15/22  Attempted to reach the patient regarding the most recent Inpatient/ED visit.  Follow Up Plan: Additional outreach attempts will be made to reach the patient to complete the Transitions of Care (Post Inpatient/ED visit) call.     Enzo Montgomery, RN,BSN,CCM [facility] Care Management Care Management Community Coordinator Direct Phone: [phone] Toll Free: [phone] Fax: [phone]

## 2022-06-15 NOTE — Telephone Encounter (Signed)
Attempted to reach patient to see if he was willing to do a virtual visit since it was past his scheduled appointment time. Left a voicemail for him to call [phone] if he would like to reschedule.

## 2022-06-15 NOTE — Telephone Encounter (Signed)
TOC call attempt #2  Attempted to reach the patient and now his message states that he has a voicemail that has not been set up, unable to leave a message.

## 2022-06-15 NOTE — Progress Notes (Signed)
  Care Coordination  Outreach Note  06/15/2022 Name: [name] MRN: [id number] DOB: 10-22-1960   Care Coordination Outreach Attempts: An unsuccessful telephone outreach was attempted today to offer the patient information about available care coordination services as a benefit of their health plan.   Follow Up Plan:  Additional outreach attempts will be made to offer the patient care coordination information and services.   Encounter Outcome:  No Answer  [place]  Direct Dial: [phone]

## 2022-06-16 ENCOUNTER — Telehealth (HOSPITAL_COMMUNITY): Payer: Self-pay | Admitting: Licensed Clinical Social Worker

## 2022-06-16 NOTE — Telephone Encounter (Signed)
CSW called pt to touch base regarding housing concerns.  Unable to reach -left VM requesting return call  Jorge Ny, [facility] Desk#: [phone] Cell#: [phone]

## 2022-06-16 NOTE — Progress Notes (Signed)
  Care Coordination  Outreach Note  06/16/2022 Name: [name] MRN: [id number] DOB: 02-04-1961   Care Coordination Outreach Attempts: A second unsuccessful outreach was attempted today to offer the patient with information about available care coordination services as a benefit of their health plan.     Follow Up Plan:  Additional outreach attempts will be made to offer the patient care coordination information and services.   Encounter Outcome:  No Answer  [place]  Direct Dial: [phone]

## 2022-06-17 NOTE — Progress Notes (Signed)
  Care Coordination  Outreach Note  06/17/2022 Name: [name] MRN: [id number] DOB: Dec 04, 1960   Care Coordination Outreach Attempts: A third unsuccessful outreach was attempted today to offer the patient with information about available care coordination services as a benefit of their health plan.   Follow Up Plan:  No further outreach attempts will be made at this time. We have been unable to contact the patient to offer or enroll patient in care coordination services  Encounter Outcome:  No Answer  [place]: [phone]

## 2022-06-20 ENCOUNTER — Telehealth (HOSPITAL_COMMUNITY): Payer: Self-pay | Admitting: Licensed Clinical Social Worker

## 2022-06-20 NOTE — Telephone Encounter (Signed)
CSW attempted to call pt to discuss current SDOH concerns- unable to reach- unable to leave VM  Jorge Ny, [place] [facility] Desk#: [phone] Cell#: [phone]

## 2022-06-22 ENCOUNTER — Telehealth (HOSPITAL_COMMUNITY): Payer: Self-pay | Admitting: Licensed Clinical Social Worker

## 2022-06-22 NOTE — Telephone Encounter (Signed)
H&V Care Navigation CSW Progress Note  Clinical Social Worker called pt to check in regarding housing concerns- unable to reach- left VM requesting return call.   SDOH Screenings   Food Insecurity: No Food Insecurity (06/02/2022)  Housing: High Risk (06/02/2022)  Transportation Needs: No Transportation Needs (06/02/2022)  Utilities: Not At Risk (06/02/2022)  Alcohol Screen: Low Risk  (09/30/2021)  Depression (PHQ2-9): Medium Risk (05/31/2022)  Financial Resource Strain: High Risk (06/06/2022)  Physical Activity: Insufficiently Active (09/30/2021)  Social Connections: Moderately Integrated (09/30/2021)  Stress: No Stress Concern Present (09/30/2021)  Tobacco Use: Low Risk  (06/10/2022)     Jorge Ny, LCSW Clinical Social Worker [facility] Desk#: [phone] Cell#: [phone]

## 2022-06-23 ENCOUNTER — Ambulatory Visit (HOSPITAL_COMMUNITY)
Admit: 2022-06-23 | Discharge: 2022-06-23 | Disposition: A | Discharge status: Home / Self Care | Payer: 59 | Attending: Cardiology | Admitting: Cardiology

## 2022-06-23 ENCOUNTER — Encounter (HOSPITAL_COMMUNITY): Payer: Self-pay

## 2022-06-23 VITALS — BP 154/84 | HR 70 | Wt 168.2 lb

## 2022-06-23 DIAGNOSIS — I482 Chronic atrial fibrillation, unspecified: Secondary | ICD-10-CM | POA: Insufficient documentation

## 2022-06-23 DIAGNOSIS — Z952 Presence of prosthetic heart valve: Secondary | ICD-10-CM | POA: Diagnosis not present

## 2022-06-23 DIAGNOSIS — I130 Hypertensive heart and chronic kidney disease with heart failure and stage 1 through stage 4 chronic kidney disease, or unspecified chronic kidney disease: Secondary | ICD-10-CM | POA: Insufficient documentation

## 2022-06-23 DIAGNOSIS — I2510 Atherosclerotic heart disease of native coronary artery without angina pectoris: Secondary | ICD-10-CM | POA: Diagnosis not present

## 2022-06-23 DIAGNOSIS — I350 Nonrheumatic aortic (valve) stenosis: Secondary | ICD-10-CM

## 2022-06-23 DIAGNOSIS — Z79899 Other long term (current) drug therapy: Secondary | ICD-10-CM | POA: Diagnosis not present

## 2022-06-23 DIAGNOSIS — I5032 Chronic diastolic (congestive) heart failure: Secondary | ICD-10-CM | POA: Diagnosis not present

## 2022-06-23 DIAGNOSIS — N1832 Chronic kidney disease, stage 3b: Secondary | ICD-10-CM | POA: Insufficient documentation

## 2022-06-23 DIAGNOSIS — I5022 Chronic systolic (congestive) heart failure: Secondary | ICD-10-CM | POA: Diagnosis not present

## 2022-06-23 DIAGNOSIS — Z7901 Long term (current) use of anticoagulants: Secondary | ICD-10-CM | POA: Insufficient documentation

## 2022-06-23 DIAGNOSIS — I447 Left bundle-branch block, unspecified: Secondary | ICD-10-CM | POA: Insufficient documentation

## 2022-06-23 DIAGNOSIS — I472 Ventricular tachycardia, unspecified: Secondary | ICD-10-CM | POA: Insufficient documentation

## 2022-06-23 DIAGNOSIS — G4733 Obstructive sleep apnea (adult) (pediatric): Secondary | ICD-10-CM | POA: Diagnosis not present

## 2022-06-23 DIAGNOSIS — I5040 Unspecified combined systolic (congestive) and diastolic (congestive) heart failure: Secondary | ICD-10-CM | POA: Insufficient documentation

## 2022-06-23 DIAGNOSIS — N179 Acute kidney failure, unspecified: Secondary | ICD-10-CM | POA: Insufficient documentation

## 2022-06-23 DIAGNOSIS — I4819 Other persistent atrial fibrillation: Secondary | ICD-10-CM

## 2022-06-23 LAB — COMPREHENSIVE METABOLIC PANEL
ALT: 13 U/L (ref 0–44)
AST: 20 U/L (ref 15–41)
Albumin: 3 g/dL — ABNORMAL LOW (ref 3.5–5.0)
Alkaline Phosphatase: 101 U/L (ref 38–126)
Anion gap: 7 (ref 5–15)
BUN: 14 mg/dL (ref 8–23)
CO2: 24 mmol/L (ref 22–32)
Calcium: 8.6 mg/dL — ABNORMAL LOW (ref 8.9–10.3)
Chloride: 108 mmol/L (ref 98–111)
Creatinine, Ser: 1.24 mg/dL (ref 0.61–1.24)
GFR, Estimated: >60 mL/min (ref >60)
Glucose, Bld: 89 mg/dL (ref 70–99)
Potassium: 3.9 mmol/L (ref 3.5–5.1)
Sodium: 139 mmol/L (ref 135–145)
Total Bilirubin: 0.7 mg/dL (ref 0.3–1.2)
Total Protein: 6.9 g/dL (ref 6.5–8.1)

## 2022-06-23 LAB — CBC
HCT: 27 % — ABNORMAL LOW (ref 39.0–52.0)
Hemoglobin: 8.7 g/dL — ABNORMAL LOW (ref 13.0–17.0)
MCH: 24.4 pg — ABNORMAL LOW (ref 26.0–34.0)
MCHC: 32.2 g/dL (ref 30.0–36.0)
MCV: 75.8 fL — ABNORMAL LOW (ref 80.0–100.0)
Platelets: 189 10*3/uL (ref 150–400)
RBC: 3.56 MIL/uL — ABNORMAL LOW (ref 4.22–5.81)
RDW: 19.6 % — ABNORMAL HIGH (ref 11.5–15.5)
WBC: 3.7 10*3/uL — ABNORMAL LOW (ref 4.0–10.5)
nRBC: 0 % (ref 0.0–0.2)

## 2022-06-23 LAB — BRAIN NATRIURETIC PEPTIDE: B Natriuretic Peptide: 187.5 pg/mL — ABNORMAL HIGH (ref 0.0–100.0)

## 2022-06-23 MED ORDER — ISOSORB DINITRATE-HYDRALAZINE 20-37.5 MG PO TABS: 2.0000 | ORAL_TABLET | Freq: Three times a day (TID) | ORAL | 11 refills | Status: DC
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
## 2022-06-23 NOTE — Patient Instructions (Signed)
INCREASE Bidil to 2 tabs three times daily  Labs today We will only contact you if something comes back abnormal or we need to make some changes. Otherwise no news is good news!   Your physician recommends that you schedule a follow-up appointment in: 2-3 months with Dr Haroldine Laws  Do the following things EVERYDAY: Weigh yourself in the morning before breakfast. Write it down and keep it in a log. Take your medicines as prescribed Eat low salt foods--Limit salt (sodium) to 2000 mg per day.  Stay as active as you can everyday Limit all fluids for the day to less than 2 liters  At the [facility], you and your health needs are our priority. As part of our continuing mission to provide you with exceptional heart care, we have created designated Provider Care Teams. These Care Teams include your primary Cardiologist (physician) and Advanced Practice Providers (APPs- Physician Assistants and Nurse Practitioners) who all work together to provide you with the care you need, when you need it.   You may see any of the following providers on your designated Care Team at your next follow up: Dr Glori Bickers Dr Loralie Champagne Dr. Roxana Hires, NP Lyda Jester, [place] [facility] [place], [place] Forestine Na, NP Audry Riles, PharmD   Please be sure to bring in all your medications bottles to every appointment.    Thank you for choosing [facility]   If you have any questions or concerns before your next appointment please send [place] a message through [place] or call our office at [phone].    TO LEAVE A MESSAGE FOR THE NURSE SELECT OPTION 2, PLEASE LEAVE A MESSAGE INCLUDING: YOUR NAME DATE OF BIRTH CALL BACK NUMBER REASON FOR CALL**this is important as we prioritize the call backs  YOU WILL RECEIVE A CALL BACK THE SAME DAY AS LONG AS YOU CALL BEFORE 4:00 PM

## 2022-07-01 ENCOUNTER — Other Ambulatory Visit: Payer: Self-pay | Admitting: Physician Assistant

## 2022-07-01 DIAGNOSIS — I4819 Other persistent atrial fibrillation: Secondary | ICD-10-CM

## 2022-07-01 NOTE — Telephone Encounter (Signed)
Requested medication (s) are due for refill today- no  Requested medication (s) are on the active medication list -no-not at this dosing  Future visit scheduled -no  Last refill: 05/31/22  Notes to clinic: mobile unit provider- Rx-sent for review - discontinued by another provider  Requested Prescriptions  Pending Prescriptions Disp Refills   carvedilol (COREG) 25 MG tablet [Pharmacy Med Name: CARVEDILOL 25MG  TABLETS] 60 tablet 0    Sig: TAKE 1 TABLET(25 MG) BY MOUTH TWICE DAILY WITH A MEAL     There is no refill protocol information for this order       Requested Prescriptions  Pending Prescriptions Disp Refills   carvedilol (COREG) 25 MG tablet [Pharmacy Med Name: CARVEDILOL 25MG  TABLETS] 60 tablet 0    Sig: TAKE 1 TABLET(25 MG) BY MOUTH TWICE DAILY WITH A MEAL     There is no refill protocol information for this order

## 2022-07-06 ENCOUNTER — Other Ambulatory Visit: Payer: Self-pay | Admitting: Critical Care Medicine

## 2022-07-06 ENCOUNTER — Other Ambulatory Visit: Payer: Self-pay | Admitting: Physician Assistant

## 2022-07-06 DIAGNOSIS — I4819 Other persistent atrial fibrillation: Secondary | ICD-10-CM

## 2022-07-06 NOTE — Telephone Encounter (Signed)
Discontinued Amlodipine by provider 08/23/21, will refuse due to this.  Requested Prescriptions  Pending Prescriptions Disp Refills   amLODipine (NORVASC) 10 MG tablet [Pharmacy Med Name: AMLODIPINE BESYLATE 10MG  TABLETS] 90 tablet 3    Sig: TAKE 1 TABLET(10 MG) BY MOUTH DAILY     Cardiovascular: Calcium Channel Blockers 2 Failed - 07/06/2022  8:37 AM      Failed - Last BP in normal range    BP Readings from Last 1 Encounters:  06/23/22 (!) 154/84         Failed - Valid encounter within last 6 months    Recent Outpatient Visits           7 months ago Primary hypertension   [place], MD   8 months ago Primary hypertension   [place], MD   9 months ago Primary hypertension   [place] [name], MD   10 months ago Severe aortic stenosis   [place], Patrick E, MD   1 year ago Severe aortic stenosis   [place], MD       Future Appointments             In 2 weeks [place] at [facility], [place] - Last Heart Rate in normal range    Pulse Readings from Last 1 Encounters:  06/23/22 70          gabapentin (NEURONTIN) 400 MG capsule [Pharmacy Med Name: GABAPENTIN 400MG  CAPSULES] 540 capsule 0    Sig: TAKE 2 CAPSULES(800 MG) BY MOUTH THREE TIMES DAILY     Neurology: Anticonvulsants - gabapentin Passed - 07/06/2022  8:37 AM      Passed - Cr in normal range and within 360 days    Creat  Date Value Ref Range Status  02/16/2022 1.75 (H) 0.70 - 1.35 mg/dL Final   Creatinine, Ser  Date Value Ref Range Status  06/23/2022 1.24 0.61 - 1.24 mg/dL Final         Passed - Completed PHQ-2 or PHQ-9 in the last 360 days      Passed - Valid encounter within last 12 months    Recent Outpatient  Visits           7 months ago Primary hypertension   [place], MD   8 months ago Primary hypertension   [place], MD   9 months ago Primary hypertension   [place], MD   10 months ago Severe aortic stenosis   [place] [name], MD   1 year ago Severe aortic stenosis   [place], MD       Future Appointments             In 2 weeks [place] at [facility], [place]

## 2022-07-06 NOTE — Telephone Encounter (Signed)
Requested medication (s) are due for refill today: Yes  Requested medication (s) are on the active medication list: Yes  Last refill:  02/28/22  Future visit scheduled: No  Notes to clinic:  Discontinued by hospital provider at discharge, routing to PCP for approval to restart     Requested Prescriptions  Pending Prescriptions Disp Refills   gabapentin (NEURONTIN) 400 MG capsule [Pharmacy Med Name: GABAPENTIN 400MG  CAPSULES] 540 capsule 0    Sig: TAKE 2 CAPSULES(800 MG) BY MOUTH THREE TIMES DAILY     Neurology: Anticonvulsants - gabapentin Passed - 07/06/2022  8:37 AM      Passed - Cr in normal range and within 360 days    Creat  Date Value Ref Range Status  02/16/2022 1.75 (H) 0.70 - 1.35 mg/dL Final   Creatinine, Ser  Date Value Ref Range Status  06/23/2022 1.24 0.61 - 1.24 mg/dL Final         Passed - Completed PHQ-2 or PHQ-9 in the last 360 days      Passed - Valid encounter within last 12 months    Recent Outpatient Visits           7 months ago Primary hypertension   [place], MD   8 months ago Primary hypertension   [place], MD   9 months ago Primary hypertension   [place], MD   10 months ago Severe aortic stenosis   [place], Patrick E, MD   1 year ago Severe aortic stenosis   [place], MD       Future Appointments             In 2 weeks [place] at [facility]             Refused Prescriptions Disp Refills   amLODipine ([place]) 10 MG tablet [Pharmacy Med Name: AMLODIPINE BESYLATE 10MG  TABLETS] 90 tablet 3    Sig: TAKE 1 TABLET(10 MG) BY MOUTH DAILY     Cardiovascular: Calcium Channel Blockers 2 Failed - 07/06/2022  8:37 AM      Failed - Last BP in  normal range    BP Readings from Last 1 Encounters:  06/23/22 (!) 154/84         Failed - Valid encounter within last 6 months    Recent Outpatient Visits           7 months ago Primary hypertension   [place], MD   8 months ago Primary hypertension   [place], MD   9 months ago Primary hypertension   [place], MD   10 months ago Severe aortic stenosis   [place], Patrick E, MD   1 year ago Severe aortic stenosis   [place], MD       Future Appointments             In 2 weeks [place] at [facility], [facility]             Passed - Last Heart Rate in normal range  Pulse Readings from Last 1 Encounters:  06/23/22 70

## 2022-07-07 ENCOUNTER — Other Ambulatory Visit: Payer: Self-pay | Admitting: Physician Assistant

## 2022-07-07 DIAGNOSIS — I5033 Acute on chronic diastolic (congestive) heart failure: Secondary | ICD-10-CM

## 2022-07-16 DIAGNOSIS — I471 Supraventricular tachycardia, unspecified: Secondary | ICD-10-CM | POA: Diagnosis not present

## 2022-07-16 DIAGNOSIS — I080 Rheumatic disorders of both mitral and aortic valves: Secondary | ICD-10-CM | POA: Diagnosis not present

## 2022-07-16 DIAGNOSIS — I5020 Unspecified systolic (congestive) heart failure: Secondary | ICD-10-CM | POA: Diagnosis not present

## 2022-07-16 DIAGNOSIS — N183 Chronic kidney disease, stage 3 unspecified: Secondary | ICD-10-CM | POA: Diagnosis not present

## 2022-07-16 DIAGNOSIS — Z7901 Long term (current) use of anticoagulants: Secondary | ICD-10-CM | POA: Diagnosis not present

## 2022-07-16 DIAGNOSIS — I517 Cardiomegaly: Secondary | ICD-10-CM | POA: Diagnosis not present

## 2022-07-16 DIAGNOSIS — N189 Chronic kidney disease, unspecified: Secondary | ICD-10-CM | POA: Diagnosis not present

## 2022-07-16 DIAGNOSIS — E785 Hyperlipidemia, unspecified: Secondary | ICD-10-CM | POA: Diagnosis not present

## 2022-07-16 DIAGNOSIS — Z952 Presence of prosthetic heart valve: Secondary | ICD-10-CM | POA: Diagnosis not present

## 2022-07-16 DIAGNOSIS — R9431 Abnormal electrocardiogram [ECG] [EKG]: Secondary | ICD-10-CM | POA: Diagnosis not present

## 2022-07-16 DIAGNOSIS — I43 Cardiomyopathy in diseases classified elsewhere: Secondary | ICD-10-CM | POA: Diagnosis not present

## 2022-07-16 DIAGNOSIS — Z91148 Patient's other noncompliance with medication regimen for other reason: Secondary | ICD-10-CM | POA: Diagnosis not present

## 2022-07-16 DIAGNOSIS — I130 Hypertensive heart and chronic kidney disease with heart failure and stage 1 through stage 4 chronic kidney disease, or unspecified chronic kidney disease: Secondary | ICD-10-CM | POA: Diagnosis not present

## 2022-07-16 DIAGNOSIS — M109 Gout, unspecified: Secondary | ICD-10-CM | POA: Diagnosis not present

## 2022-07-16 DIAGNOSIS — I4891 Unspecified atrial fibrillation: Secondary | ICD-10-CM | POA: Diagnosis not present

## 2022-07-16 DIAGNOSIS — K219 Gastro-esophageal reflux disease without esophagitis: Secondary | ICD-10-CM | POA: Diagnosis not present

## 2022-07-16 DIAGNOSIS — Z87440 Personal history of urinary (tract) infections: Secondary | ICD-10-CM | POA: Diagnosis not present

## 2022-07-16 DIAGNOSIS — N179 Acute kidney failure, unspecified: Secondary | ICD-10-CM | POA: Diagnosis not present

## 2022-07-16 DIAGNOSIS — I214 Non-ST elevation (NSTEMI) myocardial infarction: Secondary | ICD-10-CM | POA: Diagnosis not present

## 2022-07-16 DIAGNOSIS — R079 Chest pain, unspecified: Secondary | ICD-10-CM | POA: Diagnosis not present

## 2022-07-16 DIAGNOSIS — I5022 Chronic systolic (congestive) heart failure: Secondary | ICD-10-CM | POA: Diagnosis not present

## 2022-07-16 DIAGNOSIS — Z7982 Long term (current) use of aspirin: Secondary | ICD-10-CM | POA: Diagnosis not present

## 2022-07-16 DIAGNOSIS — I480 Paroxysmal atrial fibrillation: Secondary | ICD-10-CM | POA: Diagnosis not present

## 2022-07-16 DIAGNOSIS — I2510 Atherosclerotic heart disease of native coronary artery without angina pectoris: Secondary | ICD-10-CM | POA: Diagnosis not present

## 2022-07-16 DIAGNOSIS — Z79899 Other long term (current) drug therapy: Secondary | ICD-10-CM | POA: Diagnosis not present

## 2022-07-16 DIAGNOSIS — I21A1 Myocardial infarction type 2: Secondary | ICD-10-CM | POA: Diagnosis not present

## 2022-07-16 DIAGNOSIS — I340 Nonrheumatic mitral (valve) insufficiency: Secondary | ICD-10-CM | POA: Diagnosis not present

## 2022-07-16 DIAGNOSIS — E854 Organ-limited amyloidosis: Secondary | ICD-10-CM | POA: Diagnosis not present

## 2022-07-17 DIAGNOSIS — Z952 Presence of prosthetic heart valve: Secondary | ICD-10-CM | POA: Diagnosis not present

## 2022-07-17 DIAGNOSIS — N179 Acute kidney failure, unspecified: Secondary | ICD-10-CM | POA: Diagnosis not present

## 2022-07-17 DIAGNOSIS — I5020 Unspecified systolic (congestive) heart failure: Secondary | ICD-10-CM | POA: Diagnosis not present

## 2022-07-17 DIAGNOSIS — R9431 Abnormal electrocardiogram [ECG] [EKG]: Secondary | ICD-10-CM | POA: Diagnosis not present

## 2022-07-17 DIAGNOSIS — I2510 Atherosclerotic heart disease of native coronary artery without angina pectoris: Secondary | ICD-10-CM | POA: Diagnosis not present

## 2022-07-17 DIAGNOSIS — I214 Non-ST elevation (NSTEMI) myocardial infarction: Secondary | ICD-10-CM | POA: Diagnosis not present

## 2022-07-17 DIAGNOSIS — I480 Paroxysmal atrial fibrillation: Secondary | ICD-10-CM | POA: Diagnosis not present

## 2022-07-17 DIAGNOSIS — N189 Chronic kidney disease, unspecified: Secondary | ICD-10-CM | POA: Diagnosis not present

## 2022-07-17 DIAGNOSIS — I471 Supraventricular tachycardia, unspecified: Secondary | ICD-10-CM | POA: Diagnosis not present

## 2022-07-17 DIAGNOSIS — I4891 Unspecified atrial fibrillation: Secondary | ICD-10-CM | POA: Diagnosis not present

## 2022-07-18 DIAGNOSIS — I2510 Atherosclerotic heart disease of native coronary artery without angina pectoris: Secondary | ICD-10-CM | POA: Diagnosis not present

## 2022-07-18 DIAGNOSIS — I471 Supraventricular tachycardia, unspecified: Secondary | ICD-10-CM | POA: Diagnosis not present

## 2022-07-18 DIAGNOSIS — R079 Chest pain, unspecified: Secondary | ICD-10-CM | POA: Diagnosis not present

## 2022-07-18 DIAGNOSIS — N179 Acute kidney failure, unspecified: Secondary | ICD-10-CM | POA: Diagnosis not present

## 2022-07-18 DIAGNOSIS — Z952 Presence of prosthetic heart valve: Secondary | ICD-10-CM | POA: Diagnosis not present

## 2022-07-18 DIAGNOSIS — I480 Paroxysmal atrial fibrillation: Secondary | ICD-10-CM | POA: Diagnosis not present

## 2022-07-18 DIAGNOSIS — I5020 Unspecified systolic (congestive) heart failure: Secondary | ICD-10-CM | POA: Diagnosis not present

## 2022-07-18 DIAGNOSIS — N189 Chronic kidney disease, unspecified: Secondary | ICD-10-CM | POA: Diagnosis not present

## 2022-07-18 DIAGNOSIS — I214 Non-ST elevation (NSTEMI) myocardial infarction: Secondary | ICD-10-CM | POA: Diagnosis not present

## 2022-07-18 DIAGNOSIS — I517 Cardiomegaly: Secondary | ICD-10-CM | POA: Diagnosis not present

## 2022-07-19 DIAGNOSIS — I2510 Atherosclerotic heart disease of native coronary artery without angina pectoris: Secondary | ICD-10-CM | POA: Diagnosis not present

## 2022-07-19 DIAGNOSIS — I5020 Unspecified systolic (congestive) heart failure: Secondary | ICD-10-CM | POA: Diagnosis not present

## 2022-07-19 DIAGNOSIS — R079 Chest pain, unspecified: Secondary | ICD-10-CM | POA: Diagnosis not present

## 2022-07-19 DIAGNOSIS — I517 Cardiomegaly: Secondary | ICD-10-CM | POA: Diagnosis not present

## 2022-07-19 DIAGNOSIS — I214 Non-ST elevation (NSTEMI) myocardial infarction: Secondary | ICD-10-CM | POA: Diagnosis not present

## 2022-07-19 DIAGNOSIS — N179 Acute kidney failure, unspecified: Secondary | ICD-10-CM | POA: Diagnosis not present

## 2022-07-19 DIAGNOSIS — N183 Chronic kidney disease, stage 3 unspecified: Secondary | ICD-10-CM | POA: Diagnosis not present

## 2022-07-19 DIAGNOSIS — I471 Supraventricular tachycardia, unspecified: Secondary | ICD-10-CM | POA: Diagnosis not present

## 2022-07-19 DIAGNOSIS — Z952 Presence of prosthetic heart valve: Secondary | ICD-10-CM | POA: Diagnosis not present
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
## 2022-07-22 ENCOUNTER — Ambulatory Visit: Payer: 59 | Attending: Cardiology

## 2022-07-22 ENCOUNTER — Other Ambulatory Visit: Payer: Self-pay | Admitting: Cardiology

## 2022-07-22 ENCOUNTER — Ambulatory Visit (INDEPENDENT_AMBULATORY_CARE_PROVIDER_SITE_OTHER): Payer: 59 | Admitting: Cardiology

## 2022-07-22 VITALS — BP 156/88 | HR 69 | Ht 69.0 in

## 2022-07-22 DIAGNOSIS — Z79899 Other long term (current) drug therapy: Secondary | ICD-10-CM | POA: Diagnosis not present

## 2022-07-22 DIAGNOSIS — Z952 Presence of prosthetic heart valve: Secondary | ICD-10-CM | POA: Diagnosis not present

## 2022-07-22 DIAGNOSIS — R6520 Severe sepsis without septic shock: Secondary | ICD-10-CM | POA: Diagnosis not present

## 2022-07-22 DIAGNOSIS — N1831 Chronic kidney disease, stage 3a: Secondary | ICD-10-CM | POA: Diagnosis not present

## 2022-07-22 DIAGNOSIS — A400 Sepsis due to streptococcus, group A: Secondary | ICD-10-CM

## 2022-07-22 DIAGNOSIS — I5032 Chronic diastolic (congestive) heart failure: Secondary | ICD-10-CM | POA: Diagnosis not present

## 2022-07-22 DIAGNOSIS — I2510 Atherosclerotic heart disease of native coronary artery without angina pectoris: Secondary | ICD-10-CM

## 2022-07-22 DIAGNOSIS — I350 Nonrheumatic aortic (valve) stenosis: Secondary | ICD-10-CM | POA: Insufficient documentation

## 2022-07-22 DIAGNOSIS — N179 Acute kidney failure, unspecified: Secondary | ICD-10-CM

## 2022-07-22 DIAGNOSIS — I517 Cardiomegaly: Secondary | ICD-10-CM

## 2022-07-22 DIAGNOSIS — I2583 Coronary atherosclerosis due to lipid rich plaque: Secondary | ICD-10-CM | POA: Diagnosis not present

## 2022-07-22 DIAGNOSIS — Z8739 Personal history of other diseases of the musculoskeletal system and connective tissue: Secondary | ICD-10-CM | POA: Diagnosis not present

## 2022-07-22 DIAGNOSIS — I4819 Other persistent atrial fibrillation: Secondary | ICD-10-CM | POA: Diagnosis not present

## 2022-07-22 LAB — ECHOCARDIOGRAM COMPLETE
AR max vel: 1.9 cm2
AV Area VTI: 2.27 cm2
AV Area mean vel: 2.1 cm2
AV Mean grad: 10 mmHg
AV Peak grad: 19.5 mmHg
Ao pk vel: 2.21 m/s
Area-P 1/2: 2.33 cm2
S' Lateral: 2.1 cm

## 2022-07-22 MED ORDER — ENTRESTO 97-103 MG PO TABS: 1.0000 | ORAL_TABLET | Freq: Two times a day (BID) | ORAL | 11 refills | Status: DC

## 2022-07-22 MED ORDER — ALLOPURINOL 100 MG PO TABS
100.0000 mg | ORAL_TABLET | Freq: Every day | ORAL | 1 refills | Status: DC
Start: 2022-07-22 — End: 2022-11-01

## 2022-07-22 MED ORDER — AMIODARONE HCL 200 MG PO TABS: 200.0000 mg | ORAL_TABLET | Freq: Every day | ORAL | 3 refills | Status: DC

## 2022-07-22 MED ORDER — OMEPRAZOLE 40 MG PO CPDR: 40.0000 mg | DELAYED_RELEASE_CAPSULE | Freq: Every day | ORAL | 3 refills | Status: DC

## 2022-07-22 NOTE — Patient Instructions (Signed)
Medication Instructions:  Your physician recommends that you continue on your current medications as directed. Please refer to the Current Medication list given to you today.  *If you need a refill on your cardiac medications before your next appointment, please call your pharmacy*   Lab Work: TODAY: BMET, MAGNESIUM  If you have labs (blood work) drawn today and your tests are completely normal, you will receive your results only by: MyChart Message (if you have MyChart) OR A paper copy in the mail If you have any lab test that is abnormal or we need to change your treatment, we will call you to review the results.   Testing/Procedures: NONE   Follow-Up: At [facility], you and your health needs are our priority.  As part of our continuing mission to provide you with exceptional heart care, we have created designated Provider Care Teams.  These Care Teams include your primary Cardiologist (physician) and Advanced Practice Providers (APPs -  Physician Assistants and Nurse Practitioners) who all work together to provide you with the care you need, when you need it.  We recommend signing up for the patient portal called "MyChart".  Sign up information is provided on this After Visit Summary.  MyChart is used to connect with patients for Virtual Visits (Telemedicine).  Patients are able to view lab/test results, encounter notes, upcoming appointments, etc.  Non-urgent messages can be sent to your provider as well.   To learn more about what you can do with MyChart, go to [email or web address].    Your next appointment:   KEEP SCHEDULED FOLLOW-UP

## 2022-07-23 LAB — BASIC METABOLIC PANEL
BUN/Creatinine Ratio: 22 (ref 10–24)
BUN: 31 mg/dL — ABNORMAL HIGH (ref 8–27)
CO2: 21 mmol/L (ref 20–29)
Calcium: 9.8 mg/dL (ref 8.6–10.2)
Chloride: 100 mmol/L (ref 96–106)
Creatinine, Ser: 1.43 mg/dL — ABNORMAL HIGH (ref 0.76–1.27)
Glucose: 88 mg/dL (ref 70–99)
Potassium: 4.5 mmol/L (ref 3.5–5.2)
Sodium: 136 mmol/L (ref 134–144)
eGFR: 55 mL/min/{1.73_m2} — ABNORMAL LOW (ref >59)

## 2022-07-23 LAB — MAGNESIUM: Magnesium: 2.2 mg/dL (ref 1.6–2.3)

## 2022-08-05 ENCOUNTER — Telehealth (HOSPITAL_COMMUNITY): Payer: Self-pay

## 2022-08-05 ENCOUNTER — Encounter (HOSPITAL_COMMUNITY): Payer: Self-pay

## 2022-08-05 NOTE — Telephone Encounter (Signed)
Attempted to call patient in regards to Cardiac Rehab - unable to leave VM.   Mailed letter 

## 2022-08-12 ENCOUNTER — Other Ambulatory Visit: Payer: Self-pay | Admitting: Critical Care Medicine

## 2022-08-12 DIAGNOSIS — I5033 Acute on chronic diastolic (congestive) heart failure: Secondary | ICD-10-CM

## 2022-08-15 NOTE — Telephone Encounter (Signed)
Requested medication (s) are due for refill today: yes  Requested medication (s) are on the active medication list: yes  Last refill:  07/07/22  Future visit scheduled: no  Notes to clinic:  Unable to refill per protocol, courtesy refill already given, routing for provider approval.      Requested Prescriptions  Pending Prescriptions Disp Refills   furosemide (LASIX) 40 MG tablet [Pharmacy Med Name: FUROSEMIDE 40MG  TABLETS] 30 tablet 0    Sig: TAKE 1 TABLET(40 MG) BY MOUTH DAILY     Cardiovascular:  Diuretics - Loop Failed - 08/15/2022  8:07 AM      Failed - Cr in normal range and within 180 days    Creat  Date Value Ref Range Status  02/16/2022 1.75 (H) 0.70 - 1.35 mg/dL Final   Creatinine, Ser  Date Value Ref Range Status  07/22/2022 1.43 (H) 0.76 - 1.27 mg/dL Final         Failed - Last BP in normal range    BP Readings from Last 1 Encounters:  07/22/22 (!) 156/88         Failed - Valid encounter within last 6 months    Recent Outpatient Visits           8 months ago Primary hypertension   [place] [facility] Storm Frisk, [facility]   9 months ago Primary hypertension   [place] [facility] Storm Frisk, MD   10 months ago Primary hypertension   [place] [facility] Storm Frisk, MD   11 months ago Severe aortic stenosis   [place] [facility] Storm Frisk, MD   1 year ago Severe aortic stenosis   [place] [facility] Storm Frisk, MD       Future Appointments             In 10 months  [facility]             Passed - K in normal range and within 180 days    Potassium  Date Value Ref Range Status  07/22/2022 4.5 3.5 - 5.2 mmol/L Final         Passed - Ca in normal range and within 180 days    Calcium  Date Value Ref Range Status  07/22/2022 9.8 8.6 - 10.2 mg/dL  Final   Calcium, Ion  Date Value Ref Range Status  06/09/2022 1.23 1.15 - 1.40 mmol/L Final         Passed - Na in normal range and within 180 days    Sodium  Date Value Ref Range Status  07/22/2022 136 134 - 144 mmol/L Final         Passed - Cl in normal range and within 180 days    Chloride  Date Value Ref Range Status  07/22/2022 100 96 - 106 mmol/L Final         Passed - Mg Level in normal range and within 180 days    Magnesium  Date Value Ref Range Status  07/22/2022 2.2 1.6 - 2.3 mg/dL Final

## 2022-08-19 ENCOUNTER — Other Ambulatory Visit: Payer: Self-pay | Admitting: Critical Care Medicine

## 2022-08-19 ENCOUNTER — Telehealth: Payer: Self-pay | Admitting: Critical Care Medicine

## 2022-08-19 DIAGNOSIS — I5033 Acute on chronic diastolic (congestive) heart failure: Secondary | ICD-10-CM

## 2022-08-19 DIAGNOSIS — Z8739 Personal history of other diseases of the musculoskeletal system and connective tissue: Secondary | ICD-10-CM

## 2022-08-19 NOTE — Telephone Encounter (Signed)
Pt is calling in because his prescription for furosemide (LASIX) 40 MG tablet [[id number]] was denied. Pt has an appointment on June 5th at 9:30 and wants to know is him not being seen the reason for the denial? Pt says he needs this medication so his legs don't swell. Please follow up with pt.

## 2022-08-19 NOTE — Telephone Encounter (Signed)
Requested Prescriptions  Pending Prescriptions Disp Refills   allopurinol (ZYLOPRIM) 100 MG tablet 30 tablet     Sig: Take 1 tablet (100 mg total) by mouth daily.     Endocrinology:  Gout Agents - allopurinol Failed - 08/19/2022  1:05 PM      Failed - Uric Acid in normal range and within 360 days    Uric Acid  Date Value Ref Range Status  05/31/2022 10.2 (H) 3.8 - 8.4 mg/dL Final    Comment:               Therapeutic target for gout patients: <6.0         Failed - Cr in normal range and within 360 days    Creat  Date Value Ref Range Status  02/16/2022 1.75 (H) 0.70 - 1.35 mg/dL Final   Creatinine, Ser  Date Value Ref Range Status  07/22/2022 1.43 (H) 0.76 - 1.27 mg/dL Final         Passed - Valid encounter within last 12 months    Recent Outpatient Visits           8 months ago Primary hypertension   [place] [facility] Storm [facility]isk, [facility]   9 months ago Primary hypertension   [place] [facility] Storm Frisk, MD   11 months ago Primary hypertension   [place] [facility] Storm Frisk, MD   12 months ago Severe aortic stenosis   [place] [facility] Storm Frisk, MD   1 year ago Severe aortic stenosis   [place] [facility] Storm Frisk, MD       Future Appointments             In 9 months [place] [facility]             Passed - CBC within normal limits and completed in the last 12 months    WBC  Date Value Ref Range Status  06/23/2022 3.7 (L) 4.0 - 10.5 K/uL Final   RBC  Date Value Ref Range Status  06/23/2022 3.56 (L) 4.22 - 5.81 MIL/uL Final   Hemoglobin  Date Value Ref Range Status  06/23/2022 8.7 (L) 13.0 - 17.0 g/dL Final    Comment:    Reticulocyte Hemoglobin testing may be clinically indicated, consider ordering this additional test [id number]   03/08/2022  9.5 (L) 13.0 - 17.7 g/dL Final   Total hemoglobin  Date Value Ref Range Status  06/11/2022 10.8 (L) 12.0 - 16.0 g/dL Final   HCT  Date Value Ref Range Status  06/23/2022 27.0 (L) 39.0 - 52.0 % Final   Hematocrit  Date Value Ref Range Status  03/08/2022 30.9 (L) 37.5 - 51.0 % Final   MCHC  Date Value Ref Range Status  06/23/2022 32.2 30.0 - 36.0 g/dL Final   [facility]  Date Value Ref Range Status  06/23/2022 24.4 (L) 26.0 - 34.0 pg Final   MCV  Date Value Ref Range Status  06/23/2022 75.8 (L) 80.0 - 100.0 fL Final  03/08/2022 75 (L) 79 - 97 fL Final   No results found for: "PLTCOUNTKUC", "LABPLAT", "POCPLA" RDW  Date Value Ref Range Status  06/23/2022 19.6 (H) 11.5 - 15.5 % Final  03/08/2022 17.9 (H) 11.6 - 15.4 % Final          furosemide (LASIX) 40 MG tablet  30 tablet 0     Cardiovascular:  Diuretics - Loop Failed - 08/19/2022  1:05 PM      Failed - Cr in normal range and within 180 days    Creat  Date Value Ref Range Status  02/16/2022 1.75 (H) 0.70 - 1.35 mg/dL Final   Creatinine, Ser  Date Value Ref Range Status  07/22/2022 1.43 (H) 0.76 - 1.27 mg/dL Final         Failed - Last BP in normal range    BP Readings from Last 1 Encounters:  07/22/22 (!) 156/88         Failed - Valid encounter within last 6 months    Recent Outpatient Visits           8 months ago Primary hypertension   [place] [facility] Storm [facility]isk, [facility]   9 months ago Primary hypertension   [place] [facility] Storm Frisk, [facility]   11 months ago Primary hypertension   [place] [facility] Storm Frisk, MD   12 months ago Severe aortic stenosis   [place] [facility] Storm Frisk, MD   1 year ago Severe aortic stenosis   [place] [facility] Storm Frisk, MD       Future Appointments             In 9 months [place] [facility]             Passed - K in normal range and within 180 days    Potassium  Date Value Ref Range Status  07/22/2022 4.5 3.5 - 5.2 mmol/L Final         Passed - Ca in normal range and within 180 days    Calcium  Date Value Ref Range Status  07/22/2022 9.8 8.6 - 10.2 mg/dL Final   Calcium, Ion  Date Value Ref Range Status  06/09/2022 1.23 1.15 - 1.40 mmol/L Final         Passed - Na in normal range and within 180 days    Sodium  Date Value Ref Range Status  07/22/2022 136 134 - 144 mmol/L Final         Passed - Cl in normal range and within 180 days    Chloride  Date Value Ref Range Status  07/22/2022 100 96 - 106 mmol/L Final         Passed - Mg Level in normal range and within 180 days    Magnesium  Date Value Ref Range Status  07/22/2022 2.2 1.6 - 2.3 mg/dL Final

## 2022-08-19 NOTE — Telephone Encounter (Signed)
Medication Refill - Medication: allopurinol (ZYLOPRIM) 100 MG tablet [[id number]] and   furosemide (LASIX) 40 MG tablet [[id number]]   Has the patient contacted their pharmacy? Yes   [id number] - [place], [place] - [address] AT [facility] [address] [place] [address]  Phone: [phone] Fax: [phone]  Hours: Open 24 hours      Has the patient been seen for an appointment in the last year OR does the patient have an upcoming appointment? Yes.    Agent: Please be advised that RX refills may take up to 3 business days. We ask that you follow-up with your pharmacy.

## 2022-08-19 NOTE — Telephone Encounter (Signed)
Patient must keep upcoming appointment for further refills. Requested Prescriptions  Pending Prescriptions Disp Refills   furosemide (LASIX) 40 MG tablet [Pharmacy Med Name: FUROSEMIDE 40MG  TABLETS] 30 tablet 0    Sig: TAKE 1 TABLET(40 MG) BY MOUTH DAILY     Cardiovascular:  Diuretics - Loop Failed - 08/19/2022  3:16 PM      Failed - Cr in normal range and within 180 days    Creat  Date Value Ref Range Status  02/16/2022 1.75 (H) 0.70 - 1.35 mg/dL Final   Creatinine, Ser  Date Value Ref Range Status  07/22/2022 1.43 (H) 0.76 - 1.27 mg/dL Final         Failed - Last BP in normal range    BP Readings from Last 1 Encounters:  07/22/22 (!) 156/88         Failed - Valid encounter within last 6 months    Recent Outpatient Visits           8 months ago Primary hypertension   [place] [facility] Storm Frisk, [facility]   9 months ago Primary hypertension   [place] [facility] Storm Frisk, MD   11 months ago Primary hypertension   [place] [facility] Storm Frisk, MD   12 months ago Severe aortic stenosis   [place] [facility] Storm Frisk, MD   1 year ago Severe aortic stenosis   [place] Storm Frisk, MD       Future Appointments             In 2 weeks [facility]eller [place] [facility]   In 9 months   [facility]            Passed - K in normal range and within 180 days    Potassium  Date Value Ref Range Status  07/22/2022 4.5 3.5 - 5.2 mmol/L Final         Passed - Ca in normal range and within 180 days    Calcium  Date Value Ref Range Status  07/22/2022 9.8 8.6 - 10.2 mg/dL Final   Calcium, Ion  Date Value Ref Range Status  06/09/2022 1.23 1.15 - 1.40 mmol/L Final         Passed - Na in normal range and within 180 days     Sodium  Date Value Ref Range Status  07/22/2022 136 134 - 144 mmol/L Final         Passed - Cl in normal range and within 180 days    Chloride  Date Value Ref Range Status  07/22/2022 100 96 - 106 mmol/L Final         Passed - Mg Level in normal range and within 180 days    Magnesium  Date Value Ref Range Status  07/22/2022 2.2 1.6 - 2.3 mg/dL Final

## 2022-08-19 NOTE — Telephone Encounter (Signed)
This was initially denied as he does not have a recent appt. Since he scheduled for June, one was approved today. Patient called and informed.

## 2022-08-19 NOTE — Telephone Encounter (Signed)
Requested medication (s) are due for refill today: yes  Requested medication (s) are on the active medication list: yes  Last refill:  07/07/22 #30  Future visit scheduled: yes  Notes to clinic:  pt stated he was out of med. Called pharmacy of record and 1 refill Allopurinol left and will get it ready. Pt stated he did not pick up furosemide but pharmacy said he did pick up. Called pt back and informed him he has a refill on file for the Allopurinol and they will get it ready and will send back the Lasix to see if Dr Delford Field will refill until upcoming visit.    Requested Prescriptions  Pending Prescriptions Disp Refills   furosemide (LASIX) 40 MG tablet 30 tablet 0     Cardiovascular:  Diuretics - Loop Failed - 08/19/2022  1:05 PM      Failed - Cr in normal range and within 180 days    Creat  Date Value Ref Range Status  02/16/2022 1.75 (H) 0.70 - 1.35 mg/dL Final   Creatinine, Ser  Date Value Ref Range Status  07/22/2022 1.43 (H) 0.76 - 1.27 mg/dL Final         Failed - Last BP in normal range    BP Readings from Last 1 Encounters:  07/22/22 (!) 156/88         Failed - Valid encounter within last 6 months    Recent Outpatient Visits           8 months ago Primary hypertension   [place] [facility] Storm Frisk, MD   9 months ago Primary hypertension   [place] [facility] Storm Frisk, MD   11 months ago Primary hypertension   [place] [facility] Storm Frisk, MD   12 months ago Severe aortic stenosis   [facility] Storm Frisk, MD   1 year ago Severe aortic stenosis   [place] [facility] Storm Frisk, MD       Future Appointments             In 2 weeks Sharon Seller [place] [facility] [place] [facility]   In 9 months   [facility]             Passed - K in normal range and within 180 days    Potassium  Date Value Ref Range Status  07/22/2022 4.5 3.5 - 5.2 mmol/L Final         Passed - Ca in normal range and within 180 days    Calcium  Date Value Ref Range Status  07/22/2022 9.8 8.6 - 10.2 mg/dL Final   Calcium, Ion  Date Value Ref Range Status  06/09/2022 1.23 1.15 - 1.40 mmol/L Final         Passed - Na in normal range and within 180 days    Sodium  Date Value Ref Range Status  07/22/2022 136 134 - 144 mmol/L Final         Passed - Cl in normal range and within 180 days    Chloride  Date Value Ref Range Status  07/22/2022 100 96 - 106 mmol/L Final         Passed - Mg Level in normal range and within 180 days    Magnesium  Date Value Ref Range Status  07/22/2022 2.2 1.6 - 2.3 mg/dL Final         Refused Prescriptions Disp Refills   allopurinol (ZYLOPRIM) 100 MG tablet 30 tablet     Sig: Take 1 tablet (100 mg total) by mouth daily.     Endocrinology:  Gout Agents - allopurinol Failed - 08/19/2022  1:05 PM      Failed - Uric Acid in normal range and within 360 days    Uric Acid  Date Value Ref Range Status  05/31/2022 10.2 (H) 3.8 - 8.4 mg/dL Final    Comment:               Therapeutic target for gout patients: <6.0         Failed - Cr in normal range and within 360 days    Creat  Date Value Ref Range Status  02/16/2022 1.75 (H) 0.70 - 1.35 mg/dL Final   Creatinine, Ser  Date Value Ref Range Status  07/22/2022 1.43 (H) 0.76 - 1.27 mg/dL Final         Passed - Valid encounter within last 12 months    Recent Outpatient Visits           8 months ago Primary hypertension   [place] [facility] Storm Frisk, MD   9 months ago Primary hypertension   [place] [facility] Storm Frisk, MD   11 months ago Primary hypertension   [place] [facility] Storm Frisk, MD   12 months ago Severe aortic stenosis    [place] [facility] Storm Frisk, MD   1 year ago Severe aortic stenosis   [place] [facility] Storm Frisk, MD       Future Appointments             In 2 weeks Sharon Seller, [place] [facility] [place] [facility]   In 9 months  [place] [facility]            Passed - CBC within normal limits and completed in the last 12 months    WBC  Date Value Ref Range Status  06/23/2022 3.7 (L) 4.0 - 10.5 K/uL Final   RBC  Date Value Ref Range Status  06/23/2022 3.56 (L) 4.22 - 5.81 MIL/uL Final   Hemoglobin  Date Value Ref Range Status  06/23/2022 8.7 (L) 13.0 - 17.0 g/dL Final    Comment:    Reticulocyte Hemoglobin testing may be clinically indicated, consider ordering this additional test [id number]   03/08/2022 9.5 (L) 13.0 - 17.7 g/dL Final   Total hemoglobin  Date Value Ref Range Status  06/11/2022 10.8 (L) 12.0 - 16.0 g/dL Final   HCT  Date Value Ref Range Status  06/23/2022 27.0 (L) 39.0 - 52.0 % Final   Hematocrit  Date Value Ref Range Status  03/08/2022 30.9 (L) 37.5 - 51.0 % Final   MCHC  Date Value Ref Range Status  06/23/2022 32.2 30.0 - 36.0 g/dL Final   [facility]  Date Value Ref Range Status  06/23/2022 24.4 (L) 26.0 - 34.0 pg Final   MCV  Date Value Ref Range Status  06/23/2022 75.8 (L) 80.0 - 100.0 fL Final  03/08/2022 75 (L) 79 - 97 fL Final   No results found for: "PLTCOUNTKUC", "LABPLAT", "POCPLA" RDW  Date Value Ref Range Status  06/23/2022 19.6 (H)  11.5 - 15.5 % Final  03/08/2022 17.9 (H) 11.6 - 15.4 % Final
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
## 2022-08-31 ENCOUNTER — Ambulatory Visit (HOSPITAL_COMMUNITY)
Admission: RE | Admit: 2022-08-31 | Discharge: 2022-08-31 | Disposition: A | Discharge status: Home / Self Care | Payer: 59 | Source: Ambulatory Visit | Attending: Internal Medicine | Admitting: Internal Medicine

## 2022-08-31 VITALS — HR 71 | Wt 177.0 lb

## 2022-08-31 DIAGNOSIS — Z7901 Long term (current) use of anticoagulants: Secondary | ICD-10-CM | POA: Diagnosis not present

## 2022-08-31 DIAGNOSIS — Z952 Presence of prosthetic heart valve: Secondary | ICD-10-CM | POA: Insufficient documentation

## 2022-08-31 DIAGNOSIS — N1831 Chronic kidney disease, stage 3a: Secondary | ICD-10-CM | POA: Diagnosis not present

## 2022-08-31 DIAGNOSIS — I5032 Chronic diastolic (congestive) heart failure: Secondary | ICD-10-CM

## 2022-08-31 DIAGNOSIS — I4819 Other persistent atrial fibrillation: Secondary | ICD-10-CM | POA: Diagnosis not present

## 2022-08-31 DIAGNOSIS — Z8249 Family history of ischemic heart disease and other diseases of the circulatory system: Secondary | ICD-10-CM | POA: Insufficient documentation

## 2022-08-31 DIAGNOSIS — Z79899 Other long term (current) drug therapy: Secondary | ICD-10-CM | POA: Insufficient documentation

## 2022-08-31 DIAGNOSIS — I2510 Atherosclerotic heart disease of native coronary artery without angina pectoris: Secondary | ICD-10-CM | POA: Diagnosis not present

## 2022-08-31 DIAGNOSIS — Z5986 Financial insecurity: Secondary | ICD-10-CM | POA: Diagnosis not present

## 2022-08-31 DIAGNOSIS — I130 Hypertensive heart and chronic kidney disease with heart failure and stage 1 through stage 4 chronic kidney disease, or unspecified chronic kidney disease: Secondary | ICD-10-CM | POA: Insufficient documentation

## 2022-08-31 DIAGNOSIS — I5022 Chronic systolic (congestive) heart failure: Secondary | ICD-10-CM | POA: Diagnosis not present

## 2022-08-31 DIAGNOSIS — I493 Ventricular premature depolarization: Secondary | ICD-10-CM | POA: Insufficient documentation

## 2022-08-31 DIAGNOSIS — I350 Nonrheumatic aortic (valve) stenosis: Secondary | ICD-10-CM

## 2022-08-31 DIAGNOSIS — I1A0 Resistant hypertension: Secondary | ICD-10-CM | POA: Diagnosis not present

## 2022-08-31 DIAGNOSIS — I517 Cardiomegaly: Secondary | ICD-10-CM

## 2022-08-31 DIAGNOSIS — I472 Ventricular tachycardia, unspecified: Secondary | ICD-10-CM | POA: Insufficient documentation

## 2022-08-31 LAB — CBC
HCT: 37.3 % — ABNORMAL LOW (ref 39.0–52.0)
Hemoglobin: 12.1 g/dL — ABNORMAL LOW (ref 13.0–17.0)
MCH: 23.9 pg — ABNORMAL LOW (ref 26.0–34.0)
MCHC: 32.4 g/dL (ref 30.0–36.0)
MCV: 73.6 fL — ABNORMAL LOW (ref 80.0–100.0)
Platelets: 149 10*3/uL — ABNORMAL LOW (ref 150–400)
RBC: 5.07 MIL/uL (ref 4.22–5.81)
RDW: 15 % (ref 11.5–15.5)
WBC: 4.4 10*3/uL (ref 4.0–10.5)
nRBC: 0 % (ref 0.0–0.2)

## 2022-08-31 LAB — T4, FREE: Free T4: 1 ng/dL (ref 0.61–1.12)

## 2022-08-31 LAB — COMPREHENSIVE METABOLIC PANEL
ALT: 20 U/L (ref 0–44)
AST: 24 U/L (ref 15–41)
Albumin: 3.8 g/dL (ref 3.5–5.0)
Alkaline Phosphatase: 64 U/L (ref 38–126)
Anion gap: 15 (ref 5–15)
BUN: 48 mg/dL — ABNORMAL HIGH (ref 8–23)
CO2: 21 mmol/L — ABNORMAL LOW (ref 22–32)
Calcium: 9.9 mg/dL (ref 8.9–10.3)
Chloride: 102 mmol/L (ref 98–111)
Creatinine, Ser: 2.03 mg/dL — ABNORMAL HIGH (ref 0.61–1.24)
GFR, Estimated: 36 mL/min — ABNORMAL LOW (ref >60)
Glucose, Bld: 94 mg/dL (ref 70–99)
Potassium: 4.4 mmol/L (ref 3.5–5.1)
Sodium: 138 mmol/L (ref 135–145)
Total Bilirubin: 0.5 mg/dL (ref 0.3–1.2)
Total Protein: 7.8 g/dL (ref 6.5–8.1)

## 2022-08-31 LAB — TSH: TSH: 1.728 u[IU]/mL (ref 0.350–4.500)

## 2022-08-31 MED ORDER — AMIODARONE HCL 200 MG PO TABS: 100.0000 mg | ORAL_TABLET | Freq: Every day | ORAL | 3 refills | Status: DC

## 2022-08-31 NOTE — Patient Instructions (Signed)
DECREASE Amiodarone to 100 mg daily.  Labs done today, your results will be available in MyChart, we will contact you for abnormal readings.  Your physician has requested that you have a cardiac MRI. Cardiac MRI uses a computer to create images of your heart as its beating, producing both still and moving pictures of your heart and major blood vessels. For further information please visit [email or web address]. Please follow the instruction sheet given to you today for more information. ONCE APPROVED BY INSURANCE YOU WILL BE CALLED TO HAVE THE TEST ARRANGED.   PLEASE FOLLOW UP WITH DR. Bjorn Pippin. His office should call you to arrange your appointment.  Your physician recommends that you schedule a follow-up appointment in: AS NEEDED.  If you have any questions or concerns before your next appointment please send [place] a message through [place] or call our office at [phone].    TO LEAVE A MESSAGE FOR THE NURSE SELECT OPTION 2, PLEASE LEAVE A MESSAGE INCLUDING: YOUR NAME DATE OF BIRTH CALL BACK NUMBER REASON FOR CALL**this is important as we prioritize the call backs  YOU WILL RECEIVE A CALL BACK THE SAME DAY AS LONG AS YOU CALL BEFORE 4:00 PM  At the [facility], you and your health needs are our priority. As part of our continuing mission to provide you with exceptional heart care, we have created designated Provider Care Teams. These Care Teams include your primary Cardiologist (physician) and Advanced Practice Providers (APPs- Physician Assistants and Nurse Practitioners) who all work together to provide you with the care you need, when you need it.   You may see any of the following providers on your designated Care Team at your next follow up: Dr Arvilla Meres Dr Marca Ancona Dr. Marcos Eke, NP Robbie Lis, [place] [facility] [place], [place] Brynda Peon, NP Karle Plumber, PharmD   Please be sure to bring in all your medications bottles  to every appointment.    Thank you for choosing [facility]

## 2022-08-31 NOTE — Progress Notes (Signed)
ReDS Vest / Clip - 08/31/22 1400       ReDS Vest / Clip   Station Marker C    Ruler Value 30    ReDS Value Range Moderate volume overload    ReDS Actual Value 36    Anatomical Comments 30

## 2022-09-01 LAB — T3, FREE: T3, Free: 2.2 pg/mL (ref 2.0–4.4)

## 2022-09-05 ENCOUNTER — Telehealth (HOSPITAL_COMMUNITY): Payer: Self-pay | Admitting: Cardiology

## 2022-09-05 DIAGNOSIS — I5032 Chronic diastolic (congestive) heart failure: Secondary | ICD-10-CM

## 2022-09-05 DIAGNOSIS — I5033 Acute on chronic diastolic (congestive) heart failure: Secondary | ICD-10-CM

## 2022-09-05 MED ORDER — FUROSEMIDE 40 MG PO TABS
40.0000 mg | ORAL_TABLET | ORAL | 3 refills | Status: DC | PRN
Start: 2022-09-05 — End: 2023-07-13

## 2022-09-05 NOTE — Telephone Encounter (Signed)
Patient called.  Patient aware.  

## 2022-09-05 NOTE — Telephone Encounter (Signed)
-----   Message from Dolores Patty, MD sent at 09/02/2022  2:03 PM EDT ----- Scr up. Change lasix to as needed only. Repeat BMET 2 weeks

## 2022-09-07 ENCOUNTER — Encounter: Payer: Self-pay | Admitting: Physician Assistant

## 2022-09-07 ENCOUNTER — Ambulatory Visit: Payer: 59 | Attending: Physician Assistant | Admitting: Physician Assistant

## 2022-09-07 VITALS — BP 119/66 | HR 76

## 2022-09-07 DIAGNOSIS — E782 Mixed hyperlipidemia: Secondary | ICD-10-CM | POA: Diagnosis not present

## 2022-09-07 DIAGNOSIS — Z09 Encounter for follow-up examination after completed treatment for conditions other than malignant neoplasm: Secondary | ICD-10-CM | POA: Diagnosis not present

## 2022-09-07 DIAGNOSIS — I350 Nonrheumatic aortic (valve) stenosis: Secondary | ICD-10-CM

## 2022-09-07 DIAGNOSIS — I5032 Chronic diastolic (congestive) heart failure: Secondary | ICD-10-CM

## 2022-09-07 MED ORDER — ATORVASTATIN CALCIUM 20 MG PO TABS: 20.0000 mg | ORAL_TABLET | Freq: Every day | ORAL | 1 refills | Status: DC
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
## 2022-09-10 ENCOUNTER — Other Ambulatory Visit: Payer: Self-pay | Admitting: Critical Care Medicine

## 2022-09-12 NOTE — Telephone Encounter (Signed)
Unable to refill per protocol, Rx expired. Discontinued 07/22/22, completed course.  Requested Prescriptions  Pending Prescriptions Disp Refills   gabapentin (NEURONTIN) 400 MG capsule [Pharmacy Med Name: GABAPENTIN 400MG  CAPSULES] 540 capsule 0    Sig: TAKE 2 CAPSULES(800 MG) BY MOUTH THREE TIMES DAILY     Neurology: Anticonvulsants - gabapentin Failed - 09/10/2022  3:23 PM      Failed - Cr in normal range and within 360 days    Creat  Date Value Ref Range Status  02/16/2022 1.75 (H) 0.70 - 1.35 mg/dL Final   Creatinine, Ser  Date Value Ref Range Status  08/31/2022 2.03 (H) 0.61 - 1.24 mg/dL Final         Passed - Completed PHQ-2 or PHQ-9 in the last 360 days      Passed - Valid encounter within last 12 months    Recent Outpatient Visits           5 days ago Mixed hyperlipidemia   [place] [facility] [place], Marylene Land M, [place]   9 months ago Primary hypertension   [place] [facility] Storm Frisk, MD   10 months ago Primary hypertension   [place] [facility] Storm Frisk, MD   11 months ago Primary hypertension   [place] [facility] Storm Frisk, MD   1 year ago Severe aortic stenosis   [facility] Storm Frisk, MD       Future Appointments             In 1 week Daine Floras, Gavin Pound, NP [facility]   In 4 months Delford Field, Charlcie Cradle, MD [facility]   In 9 months  [facility]

## 2022-09-14 ENCOUNTER — Encounter: Payer: Self-pay | Admitting: *Deleted

## 2022-09-14 NOTE — Progress Notes (Signed)
[facility] Quality Team Note  Name: Artemio Dobie Date of Birth: Jan 14, 1961 MRN: [id number] Date: 09/14/2022  [facility] Quality Team has reviewed this patient's chart, please see recommendations below:  [facility] Quality Other; Pt due for AWV.  Last one 09/30/2021.  Pt has f/u ov 01/10/23.

## 2022-09-21 ENCOUNTER — Ambulatory Visit
Admission: RE | Admit: 2022-09-21 | Discharge: 2022-09-21 | Disposition: A | Discharge status: Home / Self Care | Payer: 59 | Source: Ambulatory Visit | Attending: Cardiology | Admitting: Cardiology

## 2022-09-21 DIAGNOSIS — I5032 Chronic diastolic (congestive) heart failure: Secondary | ICD-10-CM | POA: Diagnosis not present

## 2022-09-21 LAB — BASIC METABOLIC PANEL
Anion gap: 11 (ref 5–15)
BUN: 27 mg/dL — ABNORMAL HIGH (ref 8–23)
CO2: 20 mmol/L — ABNORMAL LOW (ref 22–32)
Calcium: 9.5 mg/dL (ref 8.9–10.3)
Chloride: 106 mmol/L (ref 98–111)
Creatinine, Ser: 1.82 mg/dL — ABNORMAL HIGH (ref 0.61–1.24)
GFR, Estimated: 41 mL/min — ABNORMAL LOW (ref >60)
Glucose, Bld: 78 mg/dL (ref 70–99)
Potassium: 4.1 mmol/L (ref 3.5–5.1)
Sodium: 137 mmol/L (ref 135–145)

## 2022-09-21 NOTE — Transportation (Signed)
Patient reported that his car broke down on the way to appointment today. Patient walked part of the way and then took the bus. CSW assisted with a taxi to get patient back home. Waiver reviewed and signed. Lasandra Beech, LCSW, CCSW-MCS [phone]  09/21/2022  [name] DOB: 30-Jan-1961 MRN: [id number]   RIDER WAIVER AND RELEASE OF LIABILITY  For the purposes of helping with transportation needs, [facility] partners with outside transportation providers (taxi companies, [place], Catering manager.) to give [employer] patients or other approved people the choice of on-demand rides [employer]") to our buildings for non-emergency visits.  By using [employer], I, the person signing this document, on behalf of myself and/or any legal minors (in my care using the [employer]), agree:  Science writer given to me are supplied by independent, outside transportation providers who do not work for, or have any affiliation with, [employer]. [facility] is not a transportation company. [facility] has no control over the quality or safety of the rides I get using [employer]. [facility] has no control over whether any outside ride will happen on time or not. [facility] gives no guarantee on the reliability, quality, safety, or availability on any rides, or that no mistakes will happen. I know and accept that traveling by vehicle (car, truck, SVU, Zenaida Niece, bus, taxi, etc.) has risks of serious injuries such as disability, being paralyzed, and death. I know and agree the risk of using [employer] is mine alone, and not [employer]. Transport Services are provided "as is" and as are available. The transportation providers are in charge for all inspections and care of the vehicles used to provide these rides. I agree not to take legal action against [facility], its agents, employees, officers, directors, representatives, insurers, attorneys, assigns, successors, subsidiaries, and  affiliates at any time for any reasons related directly or indirectly to using [employer]. I also agree not to take legal action against [facility] or its affiliates for any injury, death, or damage to property caused by or related to using [employer]. I have read this Waiver and Release of Liability, and I understand the terms used in it and their legal meaning. This Waiver is freely and voluntarily given with the understanding that my right (or any legal minors) to legal action against [facility] relating to [employer] is knowingly given up to use these services.   I attest that I read the Ride Waiver and Release of Liability to [name], gave Mr. Bonczek the opportunity to ask questions and answered the questions asked (if any). I affirm that [name] then provided consent for assistance with transportation.     [facility]

## 2022-09-21 NOTE — Addendum Note (Signed)
Encounter addended by: Marcy Siren, LCSW on: 09/21/2022 11:00 AM  Actions taken: Flowsheet accepted, Clinical Note Signed

## 2022-09-21 NOTE — Progress Notes (Deleted)
Cardiology Clinic Note   Date: 09/21/2022 ID: [name], DOB April 08, 1960, MRN [id number]  Primary Cardiologist:  Little Ishikawa, MD  Patient Profile    Martinjr Lucker is a 62 y.o. male who presents to the clinic today for ***    Past medical history significant for: CAD. R/LHC 05/27/2021: Ostial to proximal RCA 40%.  Proximal to mid RCA 20%.  RPDA 30%.  RPAV 50%.  Distal CX 50%.  D2 90%.  Proximal to mid LAD 30%.  Distal LAD 90%.   Chronic diastolic heart failure. RHC 06/03/2022: Markedly elevated filling pressures with low cardiac output.  Prominent V waves and PCWP tracing consistent with severe MR.  Mixed pulmonary hypertension. Echo 07/22/2022: EF 70 to 75%.  Severe LVH.  Normal RV function.  Moderate LAE.  Moderate MAC.  Aortic valve findings below. PAF. Onset 03/18/2021. PAD. Arterial ultrasound/ABI 09/02/2021: Right: 50 to 74% stenosis noted in SFA.  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  Diminished flow in the tibial vessels consistent with outflow disease.  Left: Monophasic waveforms in the CFA suggest iliac occlusive disease.  Diminished flow in the tibial vessels consistent with outflow disease. ABI indicates mild right lower extremity arterial disease and no significant left lower extremity arterial disease.   Abdominal aortogram with lower extremities 10/22/2021: left tibioperoneal trunk angioplasty. Left peroneal artery angioplasty.  Aortic stenosis. TAVR 06/09/2022. Echo 07/22/2022: 20 9 AM SAPIEN prosthetic (TAVR) valve present in the aortic position, no aortic stenosis, mild perivalvular leak. Severe LVH. Cardiac MRI 05/03/2021: Severe LVH measuring up to 21 mm the basal septum (16 mm posterior wall).  No evidence of amyloidosis.  Meets criteria for hypertrophic cardiomyopathy, suspect severe LVH secondary to severe aortic stenosis.  Patchy LGE in septum and RV insertion sites. Superior mesenteric artery  stenosis. Hypertension. Hyperlipidemia. GERD. OSA.     History of Present Illness    Lonnell Bea was first evaluated by Dr. Bjorn Pippin on 03/06/2020 for heart failure and aortic stenosis at the request of Dr. Delford Field.  Echo from March 2020 showed EF 50-55%, severe LVH, grade III DD, normal RV function, severe LAE, mild AI, mild to moderate AS with mean gradient 16 mmHg. Patient had recently been seen in the ED for uncontrolled hypertension.  BP at PCP office noted to be 234/140.  Patient was discharged from ED after improved BP.   Today, patient ***   ROS: All other systems reviewed and are otherwise negative except as noted in History of Present Illness.  Studies Reviewed       ECG personally reviewed by me today: ***  No significant changes from ***  Risk Assessment/Calculations    {Does this patient have ATRIAL FIBRILLATION?:[phone]} No BP recorded.  {Refresh Note OR Click here to enter BP  :1}***        Physical Exam    VS:  There were no vitals taken for this visit. , BMI There is no height or weight on file to calculate BMI.  GEN: Well nourished, well developed, in no acute distress. Neck: No JVD or carotid bruits. Cardiac: *** RRR. No murmurs. No rubs or gallops.   Respiratory:  Respirations regular and unlabored. Clear to auscultation without rales, wheezing or rhonchi. GI: Soft, nontender, nondistended. Extremities: Radials/DP/PT 2+ and equal bilaterally. No clubbing or cyanosis. No edema ***  Skin: Warm and dry, no rash. Neuro: Strength intact.  Assessment & Plan   ***  Disposition: ***     {Are you ordering a CV  Procedure (e.g. stress test, cath, DCCV, TEE, etc)?   Press F2        :[id number]   Signed, Justice Britain. Adreonna Yontz, DNP, NP-C

## 2022-09-22 ENCOUNTER — Ambulatory Visit: Payer: 59 | Admitting: Student

## 2022-09-28 ENCOUNTER — Ambulatory Visit: Payer: 59 | Attending: Critical Care Medicine

## 2022-09-28 VITALS — Ht 69.0 in | Wt 177.0 lb

## 2022-09-28 DIAGNOSIS — Z0000 Encounter for general adult medical examination without abnormal findings: Secondary | ICD-10-CM

## 2022-09-28 NOTE — Patient Instructions (Addendum)
[name] , Thank you for taking time to come for your Medicare Wellness Visit. I appreciate your ongoing commitment to your health goals. Please review the following plan we discussed and let me know if I can assist you in the future.   These are the goals we discussed:  Goals      Remain active and independent        This is a list of the screening recommended for you and due dates:  Health Maintenance  Topic Date Due   Zoster (Shingles) Vaccine (1 of 2) Never done   Flu Shot  11/03/2022   Stool Blood Test  03/26/2023   Medicare Annual Wellness Visit  09/28/2023   DTaP/Tdap/Td vaccine (3 - Td or Tdap) 07/25/2027   HIV Screening  Completed   HPV Vaccine  Aged Out   Colon Cancer Screening  Discontinued   COVID-19 Vaccine  Discontinued   Hepatitis C Screening  Discontinued    Advanced directives: Information on Advanced Care Planning can be found at [facility] Advance Health Care Directives Advance Health Care Directives ([email or web address] Please bring a copy of your health care power of attorney and living will to the office to be added to your chart at your convenience.  Conditions/risks identified: Aim for 30 minutes of exercise or brisk walking, 6-8 glasses of water, and 5 servings of fruits and vegetables each day.  Next appointment: Follow up in one year for your annual wellness visit   Preventive Care 40-64 Years, Male Preventive care refers to lifestyle choices and visits with your health care provider that can promote health and wellness. What does preventive care include? A yearly physical exam. This is also called an annual well check. Dental exams once or twice a year. Routine eye exams. Ask your health care provider how often you should have your eyes checked. Personal lifestyle choices, including: Daily care of your teeth and gums. Regular physical activity. Eating a healthy diet. Avoiding tobacco and drug use. Limiting alcohol use. Practicing  safe sex. Taking low-dose aspirin every day starting at age 67. What happens during an annual well check? The services and screenings done by your health care provider during your annual well check will depend on your age, overall health, lifestyle risk factors, and family history of disease. Counseling  Your health care provider may ask you questions about your: Alcohol use. Tobacco use. Drug use. Emotional well-being. Home and relationship well-being. Sexual activity. Eating habits. Work and work Astronomer. Screening  You may have the following tests or measurements: Height, weight, and BMI. Blood pressure. Lipid and cholesterol levels. These may be checked every 5 years, or more frequently if you are over 90 years old. Skin check. Lung cancer screening. You may have this screening every year starting at age 62 if you have a 30-pack-year history of smoking and currently smoke or have quit within the past 15 years. Fecal occult blood test (FOBT) of the stool. You may have this test every year starting at age 98. Flexible sigmoidoscopy or colonoscopy. You may have a sigmoidoscopy every 5 years or a colonoscopy every 10 years starting at age 40. Prostate cancer screening. Recommendations will vary depending on your family history and other risks. Hepatitis C blood test. Hepatitis B blood test. Sexually transmitted disease (STD) testing. Diabetes screening. This is done by checking your blood sugar (glucose) after you have not eaten for a while (fasting). You may have this done every 1-3 years. Discuss your test  results, treatment options, and if necessary, the need for more tests with your health care provider. Vaccines  Your health care provider may recommend certain vaccines, such as: Influenza vaccine. This is recommended every year. Tetanus, diphtheria, and acellular pertussis (Tdap, Td) vaccine. You may need a Td booster every 10 years. Zoster vaccine. You may need this after  age 84. Pneumococcal 13-valent conjugate (PCV13) vaccine. You may need this if you have certain conditions and have not been vaccinated. Pneumococcal polysaccharide (PPSV23) vaccine. You may need one or two doses if you smoke cigarettes or if you have certain conditions. Talk to your health care provider about which screenings and vaccines you need and how often you need them. This information is not intended to replace advice given to you by your health care provider. Make sure you discuss any questions you have with your health care provider. Document Released: 04/17/2015 Document Revised: 12/09/2015 Document Reviewed: 01/20/2015 Elsevier Interactive Patient Education  2017 [employer].  Fall Prevention in the Home Falls can cause injuries. They can happen to people of all ages. There are many things you can do to make your home safe and to help prevent falls. What can I do on the outside of my home? Regularly fix the edges of walkways and driveways and fix any cracks. Remove anything that might make you trip as you walk through a door, such as a raised step or threshold. Trim any bushes or trees on the path to your home. Use bright outdoor lighting. Clear any walking paths of anything that might make someone trip, such as rocks or tools. Regularly check to see if handrails are loose or broken. Make sure that both sides of any steps have handrails. Any raised decks and porches should have guardrails on the edges. Have any leaves, snow, or ice cleared regularly. Use sand or salt on walking paths during winter. Clean up any spills in your garage right away. This includes oil or grease spills. What can I do in the bathroom? Use night lights. Install grab bars by the toilet and in the tub and shower. Do not use towel bars as grab bars. Use non-skid mats or decals in the tub or shower. If you need to sit down in the shower, use a plastic, non-slip stool. Keep the floor dry. Clean up any  water that spills on the floor as soon as it happens. Remove soap buildup in the tub or shower regularly. Attach bath mats securely with double-sided non-slip rug tape. Do not have throw rugs and other things on the floor that can make you trip. What can I do in the bedroom? Use night lights. Make sure that you have a light by your bed that is easy to reach. Do not use any sheets or blankets that are too big for your bed. They should not hang down onto the floor. Have a firm chair that has side arms. You can use this for support while you get dressed. Do not have throw rugs and other things on the floor that can make you trip. What can I do in the kitchen? Clean up any spills right away. Avoid walking on wet floors. Keep items that you use a lot in easy-to-reach places. If you need to reach something above you, use a strong step stool that has a grab bar. Keep electrical cords out of the way. Do not use floor polish or wax that makes floors slippery. If you must use wax, use non-skid floor wax. Do  not have throw rugs and other things on the floor that can make you trip. What can I do with my stairs? Do not leave any items on the stairs. Make sure that there are handrails on both sides of the stairs and use them. Fix handrails that are broken or loose. Make sure that handrails are as long as the stairways. Check any carpeting to make sure that it is firmly attached to the stairs. Fix any carpet that is loose or worn. Avoid having throw rugs at the top or bottom of the stairs. If you do have throw rugs, attach them to the floor with carpet tape. Make sure that you have a light switch at the top of the stairs and the bottom of the stairs. If you do not have them, ask someone to add them for you. What else can I do to help prevent falls? Wear shoes that: Do not have high heels. Have rubber bottoms. Are comfortable and fit you well. Are closed at the toe. Do not wear sandals. If you use a  stepladder: Make sure that it is fully opened. Do not climb a closed stepladder. Make sure that both sides of the stepladder are locked into place. Ask someone to hold it for you, if possible. Clearly mark and make sure that you can see: Any grab bars or handrails. First and last steps. Where the edge of each step is. Use tools that help you move around (mobility aids) if they are needed. These include: Canes. Walkers. Scooters. Crutches. Turn on the lights when you go into a dark area. Replace any light bulbs as soon as they burn out. Set up your furniture so you have a clear path. Avoid moving your furniture around. If any of your floors are uneven, fix them. If there are any pets around you, be aware of where they are. Review your medicines with your doctor. Some medicines can make you feel dizzy. This can increase your chance of falling. Ask your doctor what other things that you can do to help prevent falls. This information is not intended to replace advice given to you by your health care provider. Make sure you discuss any questions you have with your health care provider. Document Released: 01/15/2009 Document Revised: 08/27/2015 Document Reviewed: 04/25/2014 Elsevier Interactive Patient Education  2017 [employer].
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
## 2022-10-31 ENCOUNTER — Encounter: Payer: Self-pay | Admitting: Critical Care Medicine

## 2022-10-31 ENCOUNTER — Telehealth: Payer: Self-pay

## 2022-10-31 ENCOUNTER — Other Ambulatory Visit: Payer: Self-pay | Admitting: Critical Care Medicine

## 2022-10-31 DIAGNOSIS — Z8739 Personal history of other diseases of the musculoskeletal system and connective tissue: Secondary | ICD-10-CM

## 2022-11-01 ENCOUNTER — Ambulatory Visit: Payer: 59 | Admitting: Student

## 2022-11-01 MED ORDER — ALLOPURINOL 100 MG PO TABS
100.0000 mg | ORAL_TABLET | Freq: Every day | ORAL | 1 refills | Status: DC
Start: 2022-11-01 — End: 2023-07-13

## 2022-11-01 MED ORDER — ALLOPURINOL 100 MG PO TABS
100.0000 mg | ORAL_TABLET | Freq: Every day | ORAL | 1 refills | Status: DC
Start: 2022-11-01 — End: 2022-11-01

## 2022-11-01 NOTE — Telephone Encounter (Signed)
Called patient unable to leave a message voice mail full.Allopurinol refill already sent to pharmacy.

## 2022-11-01 NOTE — Telephone Encounter (Signed)
Requested by interface surescripts. Medication discontinued 07/22/22.  Requested Prescriptions  Refused Prescriptions Disp Refills   gabapentin (NEURONTIN) 400 MG capsule [Pharmacy Med Name: GABAPENTIN 400MG  CAPSULES] 180 capsule 2    Sig: TAKE 2 CAPSULES(800 MG) BY MOUTH THREE TIMES DAILY     Neurology: Anticonvulsants - gabapentin Failed - 10/31/2022  2:55 PM      Failed - Cr in normal range and within 360 days    Creat  Date Value Ref Range Status  02/16/2022 1.75 (H) 0.70 - 1.35 mg/dL Final   Creatinine, Ser  Date Value Ref Range Status  09/21/2022 1.82 (H) 0.61 - 1.24 mg/dL Final         Passed - Completed PHQ-2 or PHQ-9 in the last 360 days      Passed - Valid encounter within last 12 months    Recent Outpatient Visits           1 month ago Mixed hyperlipidemia   [place] [facility] [place], Marylene Land M, [place]   11 months ago Primary hypertension   [facility] Storm Frisk, [place]   11 months ago Primary hypertension   [facility] Storm Frisk, MD   1 year ago Primary hypertension   [facility] Storm Frisk, MD   1 year ago Severe aortic stenosis   [facility] Storm Frisk, MD       Future Appointments             In 1 month [place], Gavin Pound, NP [facility]   In 2 months Delford Field, Charlcie Cradle, MD [facility]   In 7 months  [place] [facility]

## 2022-11-01 NOTE — Telephone Encounter (Signed)
Needs appt soon

## 2022-11-01 NOTE — Telephone Encounter (Signed)
We can refill now but would recommend following up with PCP for further management of gout

## 2022-11-01 NOTE — Progress Notes (Deleted)
Cardiology Clinic Note   Date: 11/01/2022 ID: Tivis Desanctis, DOB [birth year], MRN [id number]  Primary Cardiologist:  Little Ishikawa, MD  Patient Profile    [name] is a 62 y.o. male who presents to the clinic today for ***    Past medical history significant for: CAD. LHC 05/27/2021 (preop TAVR evaluation): Ostial to proximal RCA 40%.  Proximal to mid RCA 20%.  RPDA 30%.  RPA V 50%.  Distal CX 50%.  D2 90%.  Proximal to mid LAD 30%.  Distal LAD 90%. Aortic stenosis/chronic diastolic heart failure/severe LVH. Cardiac MRI 05/03/2021: Severe LVH measuring up to 21 mm in basal septum.  No evidence of amyloidosis.  Meets criteria for hypertrophic cardiomyopathy, suspect severe LVH secondary to severe aortic stenosis.  Patchy LGE in septum and RV insertion sites. RHC 06/03/2022: Markedly elevated filling pressures with low cardiac output.  Prominent V waves and PCWP tracing consistent with severe MR.  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 Echo 07/22/2022: EF 70 to 75%.  Severe concentric LVH.  Indeterminate diastolic parameters.  Normal RV function.  Moderate LAE.  Moderate MAC.  20 9 AM SAPIEN prosthetic (TAVR) valve present in aortic position with mild perivalvular leak, no aortic stenosis.  Normal structure and function of aortic valve prosthesis.  Mild dilatation of ascending aorta 40 mm. PAF. 3-day ZIO 09/27/2021: 100% A-fib burden with average rate 83 bpm. Hypertension. Hyperlipidemia. Superior mesenteric artery stenosis. Renal artery ultrasound 03/12/2020: No evidence of renal artery stenosis bilaterally.  70 to 90% stenosis superior mesenteric artery. PAD. Abdominal aortogram with lower extremity 10/22/2021: Left tibioperoneal trunk angioplasty.  Left peroneal artery angioplasty. OSA. GERD. CKD stage IIIa. Gout. CVA. Septic arthritis.     History of Present Illness    [name] was first evaluated by cardiology on 07/21/2015 during a hospital admission.  He was noted to  have abnormal echo with moderate AS.  He had a history of noncompliance with medications.  He reported a cardiology evaluation in [place] 15 years prior but had not seen a cardiologist in [place].  Patient was lost to follow-up.  He was first evaluated by Dr. Bjorn Pippin on 03/06/2020 for heart failure and aortic stenosis at the request of Dr. Delford Field..  Repeat echo showed worsened aortic stenosis.  Patient was admitted to the hospital on 06/01/2022 for worsening heart failure.  He was admitted for urosepsis with acute on chronic HFpEF.  He was diuresed with IV Lasix.  He underwent RHC which showed markedly elevated filling pressures with low cardiac output and mixed pulmonary hypertension.  He was started on milrinone and Lasix.  He ultimately underwent TAVR March 2024.  Echo April 2024 showed normal structure and function of aortic valve prosthesis with mild perivalvular leak that will continue to be monitored.  Was last seen in the office by Dr. Gala Romney on 08/31/2022 for follow-up.  He was doing well at that time and was released from the advanced heart failure clinic to follow-up with Dr. Bjorn Pippin.  Today, patient ***  CAD.  Multivessel nonobstructive CAD per angiography February 2023. Patient *** Continue aspirin, carvedilol, atorvastatin, BiDil. Chronic diastolic heart failure/aortic stenosis.  S/p TAVR March 2024.  Echo April 2024 showed normal LV/RV function, severe concentric LVH, normal structure and function of aortic valve prosthesis with mild perivalvular leak.  Patient*** Euvolemic and well compensated on exam.  Continue carvedilol, Farxiga, BiDil, Entresto, spironolactone, Lasix. PAF.  Patient*** Denies spontaneous bleeding concerns.  Continue amiodarone, carvedilol, Eliquis.  Normal TSH at 1.728 May 2024.  Liver function normal. Hypertension: BP today *** Patient denies headaches, dizziness or vision changes. Continue carvedilol, BiDil, Entresto, spironolactone.   ROS: All other  systems reviewed and are otherwise negative except as noted in History of Present Illness.  Studies Reviewed       ***  Risk Assessment/Calculations    {Does this patient have ATRIAL FIBRILLATION?:[phone]} No BP recorded.  {Refresh Note OR Click here to enter BP  :1}***        Physical Exam    VS:  There were no vitals taken for this visit. , BMI There is no height or weight on file to calculate BMI.  GEN: Well nourished, well developed, in no acute distress. Neck: No JVD or carotid bruits. Cardiac: *** RRR. No murmurs. No rubs or gallops.   Respiratory:  Respirations regular and unlabored. Clear to auscultation without rales, wheezing or rhonchi. GI: Soft, nontender, nondistended. Extremities: Radials/DP/PT 2+ and equal bilaterally. No clubbing or cyanosis. No edema ***  Skin: Warm and dry, no rash. Neuro: Strength intact.  Assessment & Plan   ***  Disposition: ***     {Are you ordering a CV Procedure (e.g. stress test, cath, DCCV, TEE, etc)?   Press F2        :[id number]}   Signed, Etta Grandchild. , DNP, NP-C

## 2022-11-02 MED ORDER — GABAPENTIN 400 MG PO CAPS: ORAL_CAPSULE | ORAL | 2 refills | Status: DC

## 2022-11-02 NOTE — Addendum Note (Signed)
Addended by: Storm Frisk on: 11/02/2022 05:34 AM   Modules accepted: Orders

## 2022-11-04 ENCOUNTER — Other Ambulatory Visit: Payer: Self-pay | Admitting: Physician Assistant

## 2022-11-04 ENCOUNTER — Other Ambulatory Visit: Payer: Self-pay | Admitting: Critical Care Medicine

## 2022-11-04 DIAGNOSIS — I350 Nonrheumatic aortic (valve) stenosis: Secondary | ICD-10-CM

## 2022-11-04 DIAGNOSIS — I4819 Other persistent atrial fibrillation: Secondary | ICD-10-CM

## 2022-11-04 DIAGNOSIS — E782 Mixed hyperlipidemia: Secondary | ICD-10-CM

## 2022-11-07 ENCOUNTER — Telehealth: Payer: Self-pay

## 2022-11-07 NOTE — Telephone Encounter (Signed)
Per Dr.Wright patient needs a sooner appointment

## 2022-11-09 ENCOUNTER — Other Ambulatory Visit (HOSPITAL_COMMUNITY): Payer: Self-pay | Admitting: Internal Medicine

## 2022-11-09 ENCOUNTER — Ambulatory Visit (HOSPITAL_COMMUNITY)
Admission: RE | Admit: 2022-11-09 | Discharge: 2022-11-09 | Disposition: A | Discharge status: Home / Self Care | Payer: 59 | Source: Ambulatory Visit | Attending: Internal Medicine | Admitting: Internal Medicine

## 2022-11-09 DIAGNOSIS — I5032 Chronic diastolic (congestive) heart failure: Secondary | ICD-10-CM

## 2022-11-09 MED ORDER — GADOBUTROL 1 MMOL/ML IV SOLN
10.0000 mL | Freq: Once | INTRAVENOUS | Status: AC | PRN
  Administered 2022-11-09: 10 mL via INTRAVENOUS

## 2022-11-11 ENCOUNTER — Other Ambulatory Visit: Payer: Self-pay | Admitting: Cardiology

## 2022-11-11 DIAGNOSIS — I5032 Chronic diastolic (congestive) heart failure: Secondary | ICD-10-CM

## 2022-11-24 ENCOUNTER — Telehealth (HOSPITAL_COMMUNITY): Payer: Self-pay | Admitting: Cardiology

## 2022-11-24 ENCOUNTER — Telehealth (HOSPITAL_COMMUNITY): Payer: Self-pay | Admitting: Licensed Clinical Social Worker

## 2022-11-24 DIAGNOSIS — E854 Organ-limited amyloidosis: Secondary | ICD-10-CM

## 2022-11-24 NOTE — Telephone Encounter (Signed)
-----   Message from Arvilla Meres sent at 11/14/2022  2:00 PM EDT ----- Cardiac MRI suggestive of TTR amyloid. Please get PYP and myeloma panel

## 2022-11-24 NOTE — Telephone Encounter (Signed)
H&V Care Navigation CSW Progress Note  Clinical Social Worker informed that pt is asking about transportation assistance for lab appt next week. Directed pt to call member services with his [facility] Dual complete plan to inquire about transportation benefits- he will do this and call back CSW if there are any issues.    SDOH Screenings   Food Insecurity: No Food Insecurity (09/28/2022)  Housing: Low Risk  (09/28/2022)  Transportation Needs: Unmet Transportation Needs (11/24/2022)  Utilities: Not At Risk (09/28/2022)  Alcohol Screen: Low Risk  (09/28/2022)  Depression (PHQ2-9): Low Risk  (09/28/2022)  Financial Resource Strain: Medium Risk (09/28/2022)  Physical Activity: Insufficiently Active (09/28/2022)  Social Connections: Moderately Integrated (09/28/2022)  Stress: No Stress Concern Present (09/28/2022)  Tobacco Use: Low Risk  (09/28/2022)    Burna Sis, LCSW Clinical Social Worker Advanced Heart Failure Clinic Desk#: [phone] Cell#: [phone]

## 2022-11-24 NOTE — Telephone Encounter (Signed)
Pt called  Pt aware of results Labs 8/30  order for PYP placed (white checkout router completed) and message to precert/scheduling as fyi

## 2022-11-25 ENCOUNTER — Other Ambulatory Visit: Payer: Self-pay | Admitting: Cardiology

## 2022-11-25 DIAGNOSIS — I5032 Chronic diastolic (congestive) heart failure: Secondary | ICD-10-CM

## 2022-11-28 ENCOUNTER — Telehealth (HOSPITAL_COMMUNITY): Payer: Self-pay | Admitting: Licensed Clinical Social Worker

## 2022-11-28 NOTE — Telephone Encounter (Signed)
Attempted to call pt to confirm he had ride to lab appt Friday- unable to reach or leave VM- will continue to attempt  Burna Sis, LCSW Clinical Social Worker [facility] Desk#: [phone] Cell#: [phone]

## 2022-11-29 ENCOUNTER — Telehealth (HOSPITAL_COMMUNITY): Payer: Self-pay | Admitting: *Deleted

## 2022-12-01 NOTE — Progress Notes (Deleted)
Cardiology Clinic Note   Date: 12/01/2022 ID: [name], DOB [birth year], MRN [id number]  Primary Cardiologist:  Little Ishikawa, MD  Patient Profile    [name] is a 62 y.o. male who presents to the clinic today for ***    Past medical history significant for: CAD. LHC 05/27/2021 (preop TAVR evaluation): Ostial to proximal RCA 40%.  Proximal to mid RCA 20%.  RPDA 30%.  RPA V 50%.  Distal CX 50%.  D2 90%.  Proximal to mid LAD 30%.  Distal LAD 90%. Aortic stenosis/chronic diastolic heart failure/severe LVH. Cardiac MRI 05/03/2021: Severe LVH measuring up to 21 mm in basal septum.  No evidence of amyloidosis.  Meets criteria for hypertrophic cardiomyopathy, suspect severe LVH secondary to severe aortic stenosis.  Patchy LGE in septum and RV insertion sites. RHC 06/03/2022: Markedly elevated filling pressures with low cardiac output.  Prominent V waves and PCWP tracing consistent with severe MR.  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 Echo 07/22/2022: EF 70 to 75%.  Severe concentric LVH.  Indeterminate diastolic parameters.  Normal RV function.  Moderate LAE.  Moderate MAC.  20 9 AM SAPIEN prosthetic (TAVR) valve present in aortic position with mild perivalvular leak, no aortic stenosis.  Normal structure and function of aortic valve prosthesis.  Mild dilatation of ascending aorta 40 mm. PAF. 3-day ZIO 09/27/2021: 100% A-fib burden with average rate 83 bpm. Hypertension. Hyperlipidemia. Superior mesenteric artery stenosis. Renal artery ultrasound 03/12/2020: No evidence of renal artery stenosis bilaterally.  70 to 90% stenosis superior mesenteric artery. PAD. Abdominal aortogram with lower extremity 10/22/2021: Left tibioperoneal trunk angioplasty.  Left peroneal artery angioplasty. OSA. GERD. CKD stage IIIa. Gout. CVA. Septic arthritis.     History of Present Illness    [name] was first evaluated by cardiology on 07/21/2015 during a hospital admission.  He was noted to  have abnormal echo with moderate AS.  He had a history of noncompliance with medications.  He reported a cardiology evaluation in [employer] 15 years prior but had not seen a cardiologist in [place].  Patient was lost to follow-up.  He was first evaluated by Dr. Bjorn Pippin on 03/06/2020 for heart failure and aortic stenosis at the request of Dr. Delford Field..  Repeat echo showed worsened aortic stenosis.  Patient was admitted to the hospital on 06/01/2022 for worsening heart failure.  He was admitted for urosepsis with acute on chronic HFpEF.  He was diuresed with IV Lasix.  He underwent RHC which showed markedly elevated filling pressures with low cardiac output and mixed pulmonary hypertension.  He was started on milrinone and Lasix.  He ultimately underwent TAVR March 2024.  Echo April 2024 showed normal structure and function of aortic valve prosthesis with mild perivalvular leak that will continue to be monitored.  Was last seen in the office by Dr. Gala Romney on 08/31/2022 for follow-up.  He was doing well at that time and was released from the advanced heart failure clinic to follow-up with Dr. Bjorn Pippin.  Today, patient ***  CAD.  Multivessel nonobstructive CAD per angiography February 2023. Patient *** Continue aspirin, carvedilol, atorvastatin, BiDil. Chronic diastolic heart failure/aortic stenosis.  S/p TAVR March 2024.  Echo April 2024 showed normal LV/RV function, severe concentric LVH, normal structure and function of aortic valve prosthesis with mild perivalvular leak.  Patient*** Euvolemic and well compensated on exam.  Continue carvedilol, Farxiga, BiDil, Entresto, spironolactone, Lasix. PAF.  Patient*** Denies spontaneous bleeding concerns.  Continue amiodarone, carvedilol, Eliquis.  Normal TSH at 1.728 May 2024.  Liver function normal. Hypertension: BP today *** Patient denies headaches, dizziness or vision changes. Continue carvedilol, BiDil, Entresto, spironolactone.   ROS: All other  systems reviewed and are otherwise negative except as noted in History of Present Illness.  Studies Reviewed       ***  Risk Assessment/Calculations    {Does this patient have ATRIAL FIBRILLATION?:[phone]} No BP recorded.  {Refresh Note OR Click here to enter BP  :1}***        Physical Exam    VS:  There were no vitals taken for this visit. , BMI There is no height or weight on file to calculate BMI.  GEN: Well nourished, well developed, in no acute distress. Neck: No JVD or carotid bruits. Cardiac: *** RRR. No murmurs. No rubs or gallops.   Respiratory:  Respirations regular and unlabored. Clear to auscultation without rales, wheezing or rhonchi. GI: Soft, nontender, nondistended. Extremities: Radials/DP/PT 2+ and equal bilaterally. No clubbing or cyanosis. No edema ***  Skin: Warm and dry, no rash. Neuro: Strength intact.  Assessment & Plan   ***  Disposition: ***     {Are you ordering a CV Procedure (e.g. stress test, cath, DCCV, TEE, etc)?   Press F2        :[id number]}   Signed, Etta Grandchild. Tynan Boesel, DNP, NP-C

## 2022-12-02 ENCOUNTER — Ambulatory Visit (HOSPITAL_COMMUNITY)
Admission: RE | Admit: 2022-12-02 | Discharge: 2022-12-02 | Disposition: A | Discharge status: Home / Self Care | Payer: 59 | Source: Ambulatory Visit | Attending: Cardiology | Admitting: Cardiology

## 2022-12-02 ENCOUNTER — Telehealth (HOSPITAL_COMMUNITY): Payer: Self-pay | Admitting: Licensed Clinical Social Worker

## 2022-12-02 DIAGNOSIS — E854 Organ-limited amyloidosis: Secondary | ICD-10-CM | POA: Insufficient documentation

## 2022-12-02 DIAGNOSIS — I43 Cardiomyopathy in diseases classified elsewhere: Secondary | ICD-10-CM | POA: Diagnosis not present

## 2022-12-02 LAB — BASIC METABOLIC PANEL
Anion gap: 15 (ref 5–15)
BUN: 25 mg/dL — ABNORMAL HIGH (ref 8–23)
CO2: 21 mmol/L — ABNORMAL LOW (ref 22–32)
Calcium: 9.2 mg/dL (ref 8.9–10.3)
Chloride: 106 mmol/L (ref 98–111)
Creatinine, Ser: 1.71 mg/dL — ABNORMAL HIGH (ref 0.61–1.24)
GFR, Estimated: 45 mL/min — ABNORMAL LOW (ref >60)
Glucose, Bld: 112 mg/dL — ABNORMAL HIGH (ref 70–99)
Potassium: 4.1 mmol/L (ref 3.5–5.1)
Sodium: 142 mmol/L (ref 135–145)

## 2022-12-02 NOTE — Telephone Encounter (Signed)
CSW attempted to call pt and pt significant other to inquire about transportation needs for appt this afternoon- unable to reach- left VM for significant other.  Burna Sis, LCSW Clinical Social Worker [facility] Desk#: [phone] Cell#: [phone]

## 2022-12-06 ENCOUNTER — Telehealth (HOSPITAL_COMMUNITY): Payer: Self-pay | Admitting: *Deleted

## 2022-12-06 ENCOUNTER — Ambulatory Visit: Payer: 59 | Attending: Student | Admitting: Student

## 2022-12-06 NOTE — Telephone Encounter (Signed)
Auth approved auth # [id number] exp 01/15/2023

## 2022-12-09 ENCOUNTER — Other Ambulatory Visit: Payer: Self-pay | Admitting: Critical Care Medicine

## 2022-12-09 ENCOUNTER — Other Ambulatory Visit: Payer: Self-pay | Admitting: Physician Assistant

## 2022-12-09 DIAGNOSIS — I350 Nonrheumatic aortic (valve) stenosis: Secondary | ICD-10-CM

## 2022-12-09 DIAGNOSIS — E782 Mixed hyperlipidemia: Secondary | ICD-10-CM

## 2022-12-09 NOTE — Telephone Encounter (Signed)
Requested medication (s) are due for refill today - unsure  Requested medication (s) are on the active medication list -yes  Future visit scheduled -yes  Last refill: 11/08/21  Notes to clinic: medication listed as historical on medication list- sent for review of request  Requested Prescriptions  Pending Prescriptions Disp Refills   spironolactone (ALDACTONE) 50 MG tablet [Pharmacy Med Name: SPIRONOLACTONE 50MG  TABLETS] 90 tablet 3    Sig: TAKE 1 TABLET(50 MG) BY MOUTH DAILY     Cardiovascular: Diuretics - Aldosterone Antagonist Failed - 12/09/2022 10:22 AM      Failed - Cr in normal range and within 180 days    Creat  Date Value Ref Range Status  02/16/2022 1.75 (H) 0.70 - 1.35 mg/dL Final   Creatinine, Ser  Date Value Ref Range Status  12/02/2022 1.71 (H) 0.61 - 1.24 mg/dL Final         Passed - K in normal range and within 180 days    Potassium  Date Value Ref Range Status  12/02/2022 4.1 3.5 - 5.1 mmol/L Final         Passed - Na in normal range and within 180 days    Sodium  Date Value Ref Range Status  12/02/2022 142 135 - 145 mmol/L Final  07/22/2022 136 134 - 144 mmol/L Final         Passed - eGFR is 30 or above and within 180 days    GFR calc Af Amer  Date Value Ref Range Status  04/29/2020 69 >59 mL/min/1.73 Final    Comment:    **In accordance with recommendations from the NKF-ASN Task force,**   [facility] is in the process of updating its eGFR calculation to the   2021 CKD-EPI creatinine equation that estimates kidney function   without a race variable.    GFR, Estimated  Date Value Ref Range Status  12/02/2022 45 (L) >60 mL/min Final    Comment:    (NOTE) Calculated using the CKD-EPI Creatinine Equation (2021)    eGFR  Date Value Ref Range Status  07/22/2022 55 (L) >59 mL/min/1.73 Final         Passed - Last BP in normal range    BP Readings from Last 1 Encounters:  09/07/22 119/66         Passed - Valid encounter within last 6 months     Recent Outpatient Visits           3 months ago Mixed hyperlipidemia   [place] [name] [place], [place], [place]   1 year ago Primary hypertension   [place] [facility] Storm Frisk, [facility]   1 year ago Primary hypertension   [place] [facility] Storm Frisk, MD   1 year ago Primary hypertension   [place] [facility] Storm Frisk, MD   1 year ago Severe aortic stenosis   [facility] Storm Frisk, MD       Future Appointments             In 1 month Delford Field Charlcie Cradle, MD [facility]   In 6 months  [place] [facility]               Requested Prescriptions  Pending Prescriptions Disp Refills   spironolactone (ALDACTONE) 50 MG tablet [Pharmacy Med Name: SPIRONOLACTONE  50MG  TABLETS] 90 tablet 3    Sig: TAKE 1 TABLET(50 MG) BY MOUTH DAILY     Cardiovascular: Diuretics - Aldosterone Antagonist Failed - 12/09/2022 10:22 AM      Failed - Cr in normal range and within 180 days    Creat  Date Value Ref Range Status  02/16/2022 1.75 (H) 0.70 - 1.35 mg/dL Final   Creatinine, Ser  Date Value Ref Range Status  12/02/2022 1.71 (H) 0.61 - 1.24 mg/dL Final         Passed - K in normal range and within 180 days    Potassium  Date Value Ref Range Status  12/02/2022 4.1 3.5 - 5.1 mmol/L Final         Passed - Na in normal range and within 180 days    Sodium  Date Value Ref Range Status  12/02/2022 142 135 - 145 mmol/L Final  07/22/2022 136 134 - 144 mmol/L Final         Passed - eGFR is 30 or above and within 180 days    GFR calc Af Amer  Date Value Ref Range Status  04/29/2020 69 >59 mL/min/1.73 Final    Comment:    **In accordance with recommendations from the NKF-ASN Task force,**   [facility] is in the process of updating its eGFR calculation to  the   2021 CKD-EPI creatinine equation that estimates kidney function   without a race variable.    GFR, Estimated  Date Value Ref Range Status  12/02/2022 45 (L) >60 mL/min Final    Comment:    (NOTE) Calculated using the CKD-EPI Creatinine Equation (2021)    eGFR  Date Value Ref Range Status  07/22/2022 55 (L) >59 mL/min/1.73 Final         Passed - Last BP in normal range    BP Readings from Last 1 Encounters:  09/07/22 119/66         Passed - Valid encounter within last 6 months    Recent Outpatient Visits           3 months ago Mixed hyperlipidemia   [place] [facility] [place], [place], [place]   1 year ago Primary hypertension   [place] [facility] Storm Frisk, MD   1 year ago Primary hypertension   [place] [facility] Storm Frisk, MD   1 year ago Primary hypertension   [place] [facility] Storm Frisk, MD   1 year ago Severe aortic stenosis   [place] [facility] Storm Frisk, MD       Future Appointments             In 1 month Delford Field Charlcie Cradle, MD [facility]   In 6 months  [place] [facility]

## 2022-12-09 NOTE — Telephone Encounter (Signed)
Requested Prescriptions  Pending Prescriptions Disp Refills   atorvastatin (LIPITOR) 20 MG tablet [Pharmacy Med Name: ATORVASTATIN 20MG  TABLETS] 90 tablet 0    Sig: TAKE 1 TABLET(20 MG) BY MOUTH DAILY     Cardiovascular:  Antilipid - Statins Failed - 12/09/2022 10:29 AM      Failed - Lipid Panel in normal range within the last 12 months    Cholesterol, Total  Date Value Ref Range Status  08/23/2021 94 (L) 100 - 199 mg/dL Final   LDL Chol Calc (NIH)  Date Value Ref Range Status  08/23/2021 46 0 - 99 mg/dL Final   HDL  Date Value Ref Range Status  08/23/2021 24 (L) >39 mg/dL Final   Triglycerides  Date Value Ref Range Status  08/23/2021 138 0 - 149 mg/dL Final         Passed - Patient is not pregnant      Passed - Valid encounter within last 12 months    Recent Outpatient Visits           3 months ago Mixed hyperlipidemia   [place] [facility] [place], [place], [place]   1 year ago Primary hypertension   [place] [facility] Storm Frisk, MD   1 year ago Primary hypertension   [place] [facility] Storm Frisk, MD   1 year ago Primary hypertension   [place] [facility] Storm Frisk, MD   1 year ago Severe aortic stenosis   [place] [facility] Storm Frisk, MD       Future Appointments             In 1 month Delford Field Charlcie Cradle, MD [facility]   In 6 months  [place] [facility]

## 2023-01-08 ENCOUNTER — Other Ambulatory Visit: Payer: Self-pay | Admitting: Cardiology

## 2023-01-10 ENCOUNTER — Ambulatory Visit: Payer: 59 | Admitting: Critical Care Medicine

## 2023-01-12 ENCOUNTER — Ambulatory Visit: Payer: 59 | Admitting: Physician Assistant

## 2023-01-12 ENCOUNTER — Encounter (HOSPITAL_COMMUNITY): Payer: Self-pay

## 2023-01-17 ENCOUNTER — Encounter (HOSPITAL_COMMUNITY): Payer: Self-pay

## 2023-01-19 ENCOUNTER — Telehealth (HOSPITAL_COMMUNITY): Payer: Self-pay | Admitting: Internal Medicine

## 2023-01-19 NOTE — Telephone Encounter (Signed)
Just an FYI. We have made several attempts to contact this patient including sending a letter to schedule or reschedule their AMYLOID. We will be removing the patient from the echo/NUCWQ.      01/17/23 called and call would not go thru @ 9 am/LBW -MY CHART message sent x 2  01/12/23 MY CHART message sent to call office to schedule/LBW  01/12/23 Call could not be completed @ 9:39/LBW  01/11/23 called and could not be completed @ 1:30/LBW  12/08/22 [facility] to schedule @ 9:46/LBW        Thank you

## 2023-01-31 ENCOUNTER — Encounter (HOSPITAL_COMMUNITY): Payer: Self-pay | Admitting: Internal Medicine

## 2023-06-11 NOTE — Progress Notes (Deleted)
 Acute Office Visit  Subjective:     Patient ID: [name], male    DOB: 11/01/1960, 63 y.o.   MRN: [id number]  No chief complaint on file.   HPI This patient is seen today for evaluation of hypertension.   11/08/21  He was seen at the wound care center last week with blood pressures in the 180/130 range.  We had just seen him in June and make changes in his blood pressure medications.  He states he has been compliant with medications as prescribed.  I called him yesterday on the phone and told him to increase his hydralazine to 100 mg 3 times daily.  All other medications are the same.  Below is documentation from the last cardiology visit Aortic stenosis: Echocardiogram 03/2021 showed severe asymmetric LVH, EF 70 to 75%, severe aortic stenosis (Vmax 4 m/s, mean gradient 33 mmHg, AVA 0.9 to 1 cm).  LHC/RHC on 05/27/2021 showed 90% distal LAD stenosis, 90% D2 stenosis, otherwise nonobstructive CAD; RA 14, RV 42/12, PA 44/22/27, PCWP 19. -Previously has been asymptomatic but now reporting dyspnea with exertion.  Seen by Dr. Laneta Simmers, planning surgical AVR.  Was scheduled for April 2023, but cancelled as patient had developed worsening lower extremity edema and wounds on legs.  Will need wounds to heal before undergoing surgery.  Found to have bilateral PAD which is likely affecting wound healing.  Underwent angiography on 10/22/2021, and underwent angioplasty to left tibioperoneal trunk and left peroneal artery.   Chronic diastolic heart failure: Echocardiogram on 04/16/2020 showed LVEF 70 to 75%, moderate LVH, grade 2 diastolic dysfunction, moderate to severe aortic stenosis (Vmax 4.0 m/s, mean gradient 38 mmHg, AVA 1.1 cm, DI 0.32).  On Lasix 40 mg thrice daily -Continue lasix.   Will check BMET, magnesium   PAD: Lower extremity duplex 09/02/2021 showed right 50 to 74% stenosis in SFA, bilateral iliac occlusive disease, diminished flow in tibial vessels bilaterally.  Seen by Dr. Edilia Bo with [facility].   Underwent angiography on 10/22/2021, and underwent angioplasty to left tibioperoneal trunk and left peroneal artery.   LVH: Severe asymmetric LVH on echo, likely due to severe AS.  Cardiac MRI on 05/03/2021 showed severe LVH measuring up to 20 mm and basal septum (16 mm and posterior wall); no evidence of amyloidosis, and while meets criteria for HCM, likely is secondary to severe aortic stenosis.  There was patchy LGE in septum and RV insertion site accounting for 3% of total myocardial mass.    Atrial fibrillation: in A. fib at prior clinic visit.  Severe biatrial enlargement on echo, may be difficult to obtain rhythm control without antiarrhythmic.  CHA2DS2-VASc score 2 (hypertension, CHF) can just do -Continue Eliquis 5 mg twice daily.  He has also been taking ASA, can discontinue since on Eliquis -Continue Coreg 25 mg twice daily.   -Seen in A-fib clinic, recommended rate control strategy for now while undergoing valve work-up.  If undergoing surgical AVR, could undergo MAZE procedure   Resistant hypertension: on carvedilol 25 mg twice daily, clonidine 0.2 mg 3 times daily, Lasix 40 mg thrice daily, hydralazine 50 mg 3 times daily, olmesartan 40 mg daily.  Work-up for secondary causes includes no evidence of renal artery stenosis on duplex.  Elevated aldosterone/renin ratio but normal aldosterone level argues against hyperaldosteronism.  Normal TSH.  Sleep study shows severe OSA, suspect this is contributing.  Started on CPAP.  Was on amlodipine but discontinued due to edema -BP elevated.  Will check BMET, if stable plan  to add spironolactone.  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  Denies any abdominal pain or unintentional weight loss.  Continue to monitor.   Hyperlipidemia: On atorvastatin 20 mg daily.  LDL 46 on 08/23/2021   OSA:  Severe OSA on sleep study, starting on CPAP.   QT prolongation: QTC 500 at prior clinic visit, most recent EKG with QTC 469, will continue to monitor     RTC in 6 weeks   The patient does have aortic stenosis but cannot have aortic valve replacement until his medical condition stabilizes the patient is had peripheral artery disease treated already   With the peripheral artery disease he has had stents placed in the left leg.  Wounds are improving from this.  Patient does note some shortness of breath with exertion.  There is no other complaints at this time.  8/22 Patient is seen in return follow-up blood pressure on arrival the best we have seen it 19/77.  Patient is feeling better wounds and lower extremity are improved.  He has less edema.  He just saw the cardiology pharmacist recently and below is documentation from that visit.  Marcelline Deist has been added.  Patient also needs repeat labs at this visit including magnesium  Saw [facility] CPP [facility] one day after seen by PCP on 8/8: 1. CHF -  BP in room today 126/79 which is at goal of <130/80.  Due for labs but will postpone for 1 week since he had not decreased his magnesium yet. Will also be able to assess if he can tolerate spironolactone.     Will add Farxiga at this time for HFpEF benefit and continue other HTN medications. Has follow up with Dr Bjorn Pippin in 1 month.   Start Farxiga 10mg  daily Continue: Spironolactone 50mg  daily Olmesartan 40mg  daily Furosmide 40mg  TID Carvedilol 25mg  BID Hydralazine 100mg  TID Check BMP in 1 week Recheck with Dr Bjorn Pippin in 4 weeks   Laural Golden, PharmD, BCACP, CDCES, CPP  Patient has follow-up visit with vascular surgery this week then sees cardiology again in 3 weeks he has a CPAP titration study middle of September  06/15/23  Expand All Collapse All Patient ID: [name], male   DOB: [birth year], 63 y.o.   MRN: [id number]        [name], is a 63 y.o. male   WUJ:[id number]    NFA:[id number]   DOB - Jan 12, 1961      Chief Complaint  Patient presents with   Hypertension        Subjective:    [name] is a 63 y.o. male here today for a follow up visit s/p hospitalization/heart cath/ s/p sepsis in February of this year.  Just saw cardiology on 08/31/2022.   He is doing great.  He has not been taking atorvastatin.  He can do a brisk walk without CP/SOB.  He has not been having to take furosemide regularly and has not had edema in a few weeks.       From [facility] 08/31/2022 HPI:   Mr Krogh is a 63 y.o. male HFpEF, severe AS, MR, CKD, AF. Had been followed by Dr. Laneta Simmers and structural heart team for severe AS but AVR was delayed due to septic knee arthritis (10/23).   Admitted 2/24 with UTI and possible sepsis. Developed respiratory failure and  shock w/ lactic acidosis.  Taken to cath lab 3/1 for RHC. Found to be in cardiogenic shock. RHC 06/03/22: RA 17 PA 72/41 (50) PCWP 30 (v=45) FICK CI 2.1 Thermo 1.6. Started on milrinone and IV lasix. Echo severe calcific aortic stenosis and severe central mitral regurgitation. EF 45-50%. Optimized from HF standpoint and underwent TAVR on 06/09/22.    Echo 4/24 EF 70-75% + severe LVH TAVR valvle with mild peri-valvular AI. No MR Personally reviewed   Returns for post hospital f/u. Says he feels great. Very active. No CP. SOB, orthopnea or PND. BP running 120/70s Compliant with all meds.    ASSESSMENT & PLAN:   1. Chronic systolic HF with recovered EF - Echo 3/1/24LV EF 40-45% severe AS severe MR - Echo 11/23 EF 55% severe AS mild MR - Cath 3/23: LAD apical 90% D2 90% LCx 50%d RCA 40%p (Medical rx) - Admitted 3/24 with marked volume overload and cardiogenic shock - RHC 06/03/22: RA 17 PA 72/41 (50) PCWP 30 (v=45) FICK CI 2.1 Thermo 1.6. Started on milrinone and IV lasix - s/p TAVR 3/7 - Post-TAVR echo 06/09/21 EF 60-65% mod MR - Echo 4/24 EF 70-75% severe LVH TAVR valvle with mild peri-valvular AI. No MR -  Echo today 08/31/22: EF 70-75% severe LVH No MR Personally reviewed - Volume status stable. May be a bit dry. -> check labs  - Continue lasix 40 daily (may need to stop if labs suggest volume depletion) - Continue Entresto to 97/103 bid - Continue spiro 25 mg daily  - Continue bidil 2 tab tid.   - Continue carvedilol 6.25 mg BID  - Would avoid SGLT2i with UTIs and lack of circumcision  - Echo with severe LVH. Likely due in part to AS and HTN but need to exclude component of amyloid. Check cMRI    2. AS - Severe AS s/p TAVR 06/09/22 w/ Dr Excell Seltzer  - Echo 4/24 with mild peri-valvular AI - TAVR stable on echo today Personally reviewed - Discussed SBE prophylaxis (has dentures)    3. MR  - Mod-Severe  - Post TAVR echo with moderate MR -> follow - Echo 4/24 EF 70-75% TAVR valvle with mild peri-valvular AI. No MR - No MR on echo today 08/31/22   4. Persistent A fib. -> NSR - He is now back in NSR - Decrease amio to 100 daily. Now that EF is better would consider eventually switching to another agent but not candidate for flecainide with structural heart disease ? Multaq - Continue eliquis 5 mg twice a day. No bleeding   5. CAD  - primarily non-obstructive with distal/branch vessel disease - no s/s angina - continue medical management   6. CKD 3a - Baseline Scr 1.4-1.5 - check labs today   7. PVCs/NSVT - On amio 200 mg daily. Decrease to 100 daily - Keep K > 4.0 Mg > 2.0   8. HTN -Well controlled   9. H/O Septic Knee 01/2022  - Coag negative staph. Completed daptomycin x 4 weeks then switched to linezolid 02/20/22 - resolved         Review of Systems  Constitutional:  Negative for chills, diaphoresis, fever, malaise/fatigue and weight loss.  HENT:  Negative for congestion, hearing loss, nosebleeds, sore throat and tinnitus.   Eyes:  Negative for blurred vision, photophobia and redness.  Respiratory:  Negative for cough, hemoptysis, sputum production, shortness of breath,  wheezing and stridor.   Cardiovascular:  Negative for chest pain, palpitations, orthopnea, claudication,  leg swelling and PND.  Gastrointestinal:  Negative for abdominal pain, blood in stool, constipation, diarrhea, heartburn, nausea and vomiting.  Genitourinary:  Negative for dysuria, flank pain, frequency, hematuria and urgency.  Musculoskeletal:  Negative for back pain, falls, joint pain, myalgias and neck pain.  Skin:  Negative for itching and rash.  Neurological:  Negative for dizziness, tingling, tremors, sensory change, speech change, focal weakness, seizures, loss of consciousness, weakness and headaches.  Endo/Heme/Allergies:  Negative for environmental allergies and polydipsia. Does not bruise/bleed easily.  Psychiatric/Behavioral:  Negative for depression, memory loss, substance abuse and suicidal ideas. The patient is not nervous/anxious and does not have insomnia.         Objective:    There were no vitals taken for this visit.   Physical Exam Vitals reviewed.  Constitutional:      Appearance: Normal appearance. He is well-developed. He is not diaphoretic.  HENT:     Head: Normocephalic and atraumatic.     Nose: No nasal deformity, septal deviation, mucosal edema or rhinorrhea.     Right Sinus: No maxillary sinus tenderness or frontal sinus tenderness.     Left Sinus: No maxillary sinus tenderness or frontal sinus tenderness.     Mouth/Throat:     Pharynx: No oropharyngeal exudate.  Eyes:     General: No scleral icterus.    Conjunctiva/sclera: Conjunctivae normal.     Pupils: Pupils are equal, round, and reactive to light.  Neck:     Thyroid: No thyromegaly.     Vascular: No carotid bruit or JVD.     Trachea: Trachea normal. No tracheal tenderness or tracheal deviation.  Cardiovascular:     Rate and Rhythm: Normal rate and regular rhythm.     Chest Wall: PMI is not displaced.     Pulses: Normal pulses. No decreased pulses.     Heart sounds: Normal heart sounds,  S1 normal and S2 normal. Heart sounds not distant. No murmur heard.    No systolic murmur is present.     No diastolic murmur is present.     No friction rub. No gallop. No S3 or S4 sounds.  Pulmonary:     Effort: No tachypnea, accessory muscle usage or respiratory distress.     Breath sounds: No stridor. No decreased breath sounds, wheezing, rhonchi or rales.  Chest:     Chest wall: No tenderness.  Abdominal:     General: Bowel sounds are normal. There is no distension.     Palpations: Abdomen is soft. Abdomen is not rigid.     Tenderness: There is no abdominal tenderness. There is no guarding or rebound.  Musculoskeletal:        General: Normal range of motion.     Cervical back: Normal range of motion and neck supple. No edema, erythema or rigidity. No muscular tenderness. Normal range of motion.  Lymphadenopathy:     Head:     Right side of head: No submental or submandibular adenopathy.     Left side of head: No submental or submandibular adenopathy.     Cervical: No cervical adenopathy.  Skin:    General: Skin is warm and dry.     Coloration: Skin is not pale.     Findings: No rash.     Nails: There is no clubbing.  Neurological:     Mental Status: He is alert and oriented to person, place, and time.     Sensory: No sensory deficit.  Psychiatric:  Speech: Speech normal.        Behavior: Behavior normal.     No results found for any visits on 06/15/23.      Assessment & Plan:   Problem List Items Addressed This Visit   None   No orders of the defined types were placed in this encounter.  No follow-ups on file.  [name], MD

## 2023-06-14 ENCOUNTER — Ambulatory Visit (HOSPITAL_COMMUNITY): Payer: 59

## 2023-06-14 ENCOUNTER — Encounter (HOSPITAL_COMMUNITY): Payer: Self-pay | Admitting: Cardiology

## 2023-06-14 ENCOUNTER — Ambulatory Visit: Payer: 59

## 2023-06-14 ENCOUNTER — Ambulatory Visit (HOSPITAL_COMMUNITY): Payer: 59 | Attending: Critical Care Medicine

## 2023-06-14 ENCOUNTER — Other Ambulatory Visit (HOSPITAL_COMMUNITY): Payer: 59
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
## 2023-06-15 ENCOUNTER — Ambulatory Visit: Payer: 59 | Admitting: Critical Care Medicine

## 2023-06-15 ENCOUNTER — Telehealth: Payer: Self-pay | Admitting: Physician Assistant
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
## 2023-07-05 ENCOUNTER — Encounter: Payer: Self-pay | Admitting: Cardiovascular Disease

## 2023-07-05 ENCOUNTER — Other Ambulatory Visit: Payer: Self-pay

## 2023-07-05 ENCOUNTER — Ambulatory Visit (HOSPITAL_COMMUNITY): Attending: Cardiology

## 2023-07-05 DIAGNOSIS — I4819 Other persistent atrial fibrillation: Secondary | ICD-10-CM

## 2023-07-05 DIAGNOSIS — Z952 Presence of prosthetic heart valve: Secondary | ICD-10-CM | POA: Insufficient documentation

## 2023-07-05 DIAGNOSIS — I340 Nonrheumatic mitral (valve) insufficiency: Secondary | ICD-10-CM | POA: Diagnosis not present

## 2023-07-05 LAB — ECHOCARDIOGRAM COMPLETE
AR max vel: 2.8 cm2
AV Area VTI: 2.78 cm2
AV Area mean vel: 2.74 cm2
AV Mean grad: 11.7 mmHg
AV Peak grad: 21.8 mmHg
Ao pk vel: 2.34 m/s
Area-P 1/2: 3.91 cm2
Calc EF: 44 %
P 1/2 time: 366 ms
S' Lateral: 2.8 cm
Single Plane A2C EF: 44.3 %
Single Plane A4C EF: 45.1 %

## 2023-07-05 MED ORDER — ISOSORB DINITRATE-HYDRALAZINE 20-37.5 MG PO TABS: 2.0000 | ORAL_TABLET | Freq: Three times a day (TID) | ORAL | 0 refills | Status: DC

## 2023-07-05 MED ORDER — CARVEDILOL 6.25 MG PO TABS: 6.2500 mg | ORAL_TABLET | Freq: Two times a day (BID) | ORAL | 0 refills | Status: DC

## 2023-07-05 MED ORDER — ENTRESTO 97-103 MG PO TABS: 1.0000 | ORAL_TABLET | Freq: Two times a day (BID) | ORAL | 0 refills | Status: DC

## 2023-07-05 NOTE — Progress Notes (Signed)
 Patient presented today for transthoracic echocardiogram.  He reported to the sonographer that he had recently had 2 falls.  On both occasions, he had been walking downhill and essentially tripped over his own feet.  He did not have any injury.  He noted that he has been out of his medications for the past month since moving.  Blood pressure today in clinic was severely elevated, systolic greater than 200 mmHg.  He was not having any headache, chest discomfort, shortness of breath.  Heart was regular, he was laying flat comfortably.  No obvious shortness of breath or significant dependent edema.  We provided refills for his Coreg, BiDil, and Entresto for him to pick up today at his routine pharmacy.  He ensured me that he will pick up his medications and resume them today.  He will contact the pharmacy to request refills for his other prescriptions.

## 2023-07-05 NOTE — Progress Notes (Signed)
 Patient found to have elevated blood pressure at echo appointment, 202/127. Patient states he has not taken any of his medication in almost a month due to them being lost in a move. Dr Nelly Laurence (DOD) approved to send in 1 month of coreg, bidel, and entresto. Patient to follow up with Dr Bjorn Pippin for refills. Patient denies any dizziness or headache currently.

## 2023-07-06 ENCOUNTER — Other Ambulatory Visit: Payer: Self-pay

## 2023-07-06 ENCOUNTER — Emergency Department (HOSPITAL_COMMUNITY)
Admission: EM | Admit: 2023-07-06 | Discharge: 2023-07-06 | Disposition: A | Discharge status: Home / Self Care | Attending: Emergency Medicine | Admitting: Emergency Medicine

## 2023-07-06 ENCOUNTER — Emergency Department (HOSPITAL_COMMUNITY)

## 2023-07-06 DIAGNOSIS — I130 Hypertensive heart and chronic kidney disease with heart failure and stage 1 through stage 4 chronic kidney disease, or unspecified chronic kidney disease: Secondary | ICD-10-CM | POA: Insufficient documentation

## 2023-07-06 DIAGNOSIS — R531 Weakness: Secondary | ICD-10-CM | POA: Diagnosis present

## 2023-07-06 DIAGNOSIS — R7989 Other specified abnormal findings of blood chemistry: Secondary | ICD-10-CM | POA: Insufficient documentation

## 2023-07-06 DIAGNOSIS — N189 Chronic kidney disease, unspecified: Secondary | ICD-10-CM | POA: Diagnosis not present

## 2023-07-06 DIAGNOSIS — I509 Heart failure, unspecified: Secondary | ICD-10-CM | POA: Diagnosis not present

## 2023-07-06 LAB — COMPREHENSIVE METABOLIC PANEL WITH GFR
ALT: 9 U/L (ref 0–44)
AST: 24 U/L (ref 15–41)
Albumin: 3 g/dL — ABNORMAL LOW (ref 3.5–5.0)
Alkaline Phosphatase: 42 U/L (ref 38–126)
Anion gap: 11 (ref 5–15)
BUN: 27 mg/dL — ABNORMAL HIGH (ref 8–23)
CO2: 21 mmol/L — ABNORMAL LOW (ref 22–32)
Calcium: 8.8 mg/dL — ABNORMAL LOW (ref 8.9–10.3)
Chloride: 107 mmol/L (ref 98–111)
Creatinine, Ser: 1.85 mg/dL — ABNORMAL HIGH (ref 0.61–1.24)
GFR, Estimated: 40 mL/min — ABNORMAL LOW (ref >60)
Glucose, Bld: 121 mg/dL — ABNORMAL HIGH (ref 70–99)
Potassium: 3.3 mmol/L — ABNORMAL LOW (ref 3.5–5.1)
Sodium: 139 mmol/L (ref 135–145)
Total Bilirubin: 0.9 mg/dL (ref 0.0–1.2)
Total Protein: 6.4 g/dL — ABNORMAL LOW (ref 6.5–8.1)

## 2023-07-06 LAB — TROPONIN I (HIGH SENSITIVITY)
Troponin I (High Sensitivity): 52 ng/L — ABNORMAL HIGH (ref <18)
Troponin I (High Sensitivity): 45 ng/L — ABNORMAL HIGH (ref <18)

## 2023-07-06 LAB — CBC WITH DIFFERENTIAL/PLATELET
Abs Immature Granulocytes: 0.02 10*3/uL (ref 0.00–0.07)
Basophils Absolute: 0 10*3/uL (ref 0.0–0.1)
Basophils Relative: 1 %
Eosinophils Absolute: 0.1 10*3/uL (ref 0.0–0.5)
Eosinophils Relative: 3 %
HCT: 36 % — ABNORMAL LOW (ref 39.0–52.0)
Hemoglobin: 11.4 g/dL — ABNORMAL LOW (ref 13.0–17.0)
Immature Granulocytes: 1 %
Lymphocytes Relative: 22 %
Lymphs Abs: 0.9 10*3/uL (ref 0.7–4.0)
MCH: 23.7 pg — ABNORMAL LOW (ref 26.0–34.0)
MCHC: 31.7 g/dL (ref 30.0–36.0)
MCV: 74.7 fL — ABNORMAL LOW (ref 80.0–100.0)
Monocytes Absolute: 0.4 10*3/uL (ref 0.1–1.0)
Monocytes Relative: 9 %
Neutro Abs: 2.9 10*3/uL (ref 1.7–7.7)
Neutrophils Relative %: 64 %
Platelets: 116 10*3/uL — ABNORMAL LOW (ref 150–400)
RBC: 4.82 MIL/uL (ref 4.22–5.81)
RDW: 17 % — ABNORMAL HIGH (ref 11.5–15.5)
WBC: 4.4 10*3/uL (ref 4.0–10.5)
nRBC: 0 % (ref 0.0–0.2)

## 2023-07-06 LAB — LIPASE, BLOOD: Lipase: 32 U/L (ref 11–51)

## 2023-07-06 MED ORDER — SODIUM CHLORIDE 0.9 % IV BOLUS
500.0000 mL | Freq: Once | INTRAVENOUS | Status: AC
  Administered 2023-07-06: 500 mL via INTRAVENOUS
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
## 2023-07-06 NOTE — ED Triage Notes (Signed)
 Pt BIB EMS from home, wife called after he woke up this morning w/ generalized bilateral weakness. LKW 2330. Started back on entresto and carvedilol, doses at 1530 and 2330. Hx of CHF, CKD.Had a fall yesterday, said his feet went out from underneath him. States he did hit his head but not hard. Has bruising on right arm.  Had not had meds for past 2 weeks due to misplacing them, but started them again yesterday. Diaphoretic and pale on EMS arrival.  Aox4.

## 2023-07-06 NOTE — Discharge Instructions (Signed)
 Please continue your home medications and follow closely with your PCP in the coming week. Return with any new or worsening symptoms.

## 2023-07-06 NOTE — ED Notes (Signed)
I tried to get patient blood I had no success.

## 2023-07-12 NOTE — Progress Notes (Deleted)
 Acute Office Visit  Subjective:     Patient ID: [name], male    DOB: March 10, 1961, 63 y.o.   MRN: [id number]  No chief complaint on file.   HPI 11/25/22 This patient is seen today for evaluation of hypertension.  He was seen at the wound care center last week with blood pressures in the 180/130 range.  We had just seen him in June and make changes in his blood pressure medications.  He states he has been compliant with medications as prescribed.  I called him yesterday on the phone and told him to increase his hydralazine to 100 mg 3 times daily.  All other medications are the same.  Below is documentation from the last cardiology visit Aortic stenosis: Echocardiogram 03/2021 showed severe asymmetric LVH, EF 70 to 75%, severe aortic stenosis (Vmax 4 m/s, mean gradient 33 mmHg, AVA 0.9 to 1 cm).  LHC/RHC on 05/27/2021 showed 90% distal LAD stenosis, 90% D2 stenosis, otherwise nonobstructive CAD; RA 14, RV 42/12, PA 44/22/27, PCWP 19. -Previously has been asymptomatic but now reporting dyspnea with exertion.  Seen by Dr. Laneta Simmers, planning surgical AVR.  Was scheduled for April 2023, but cancelled as patient had developed worsening lower extremity edema and wounds on legs.  Will need wounds to heal before undergoing surgery.  Found to have bilateral PAD which is likely affecting wound healing.  Underwent angiography on 10/22/2021, and underwent angioplasty to left tibioperoneal trunk and left peroneal artery.   Chronic diastolic heart failure: Echocardiogram on 04/16/2020 showed LVEF 70 to 75%, moderate LVH, grade 2 diastolic dysfunction, moderate to severe aortic stenosis (Vmax 4.0 m/s, mean gradient 38 mmHg, AVA 1.1 cm, DI 0.32).  On Lasix 40 mg thrice daily -Continue lasix.   Will check BMET, magnesium   PAD: Lower extremity duplex 09/02/2021 showed right 50 to 74% stenosis in SFA, bilateral iliac occlusive disease, diminished flow in tibial vessels bilaterally.  Seen by Dr. Edilia Bo with [facility].   Underwent angiography on 10/22/2021, and underwent angioplasty to left tibioperoneal trunk and left peroneal artery.   LVH: Severe asymmetric LVH on echo, likely due to severe AS.  Cardiac MRI on 05/03/2021 showed severe LVH measuring up to 20 mm and basal septum (16 mm and posterior wall); no evidence of amyloidosis, and while meets criteria for HCM, likely is secondary to severe aortic stenosis.  There was patchy LGE in septum and RV insertion site accounting for 3% of total myocardial mass.    Atrial fibrillation: in A. fib at prior clinic visit.  Severe biatrial enlargement on echo, may be difficult to obtain rhythm control without antiarrhythmic.  CHA2DS2-VASc score 2 (hypertension, CHF) can just do -Continue Eliquis 5 mg twice daily.  He has also been taking ASA, can discontinue since on Eliquis -Continue Coreg 25 mg twice daily.   -Seen in A-fib clinic, recommended rate control strategy for now while undergoing valve work-up.  If undergoing surgical AVR, could undergo MAZE procedure   Resistant hypertension: on carvedilol 25 mg twice daily, clonidine 0.2 mg 3 times daily, Lasix 40 mg thrice daily, hydralazine 50 mg 3 times daily, olmesartan 40 mg daily.  Work-up for secondary causes includes no evidence of renal artery stenosis on duplex.  Elevated aldosterone/renin ratio but normal aldosterone level argues against hyperaldosteronism.  Normal TSH.  Sleep study shows severe OSA, suspect this is contributing.  Started on CPAP.  Was on amlodipine but discontinued due to edema -BP elevated.  Will check BMET, if stable plan to add  spironolactone.  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  Denies any abdominal pain or unintentional weight loss.  Continue to monitor.   Hyperlipidemia: On atorvastatin 20 mg daily.  LDL 46 on 08/23/2021   OSA:  Severe OSA on sleep study, starting on CPAP.   QT prolongation: QTC 500 at prior clinic visit, most recent EKG with QTC 469, will continue to monitor     RTC in 6 weeks   The patient does have aortic stenosis but cannot have aortic valve replacement until his medical condition stabilizes the patient is had peripheral artery disease treated already   With the peripheral artery disease he has had stents placed in the left leg.  Wounds are improving from this.  Patient does note some shortness of breath with exertion.  There is no other complaints at this time.   07/13/23 HF clinic 08/2022  Chronic systolic HF with recovered EF - Echo 3/1/24LV EF 40-45% severe AS severe MR - Echo 11/23 EF 55% severe AS mild MR - Cath 3/23: LAD apical 90% D2 90% LCx 50%d RCA 40%p (Medical rx) - Admitted 3/24 with marked volume overload and cardiogenic shock - RHC 06/03/22: RA 17 PA 72/41 (50) PCWP 30 (v=45) FICK CI 2.1 Thermo 1.6. Started on milrinone and IV lasix - s/p TAVR 3/7 - Post-TAVR echo 06/09/21 EF 60-65% mod MR - Echo 4/24 EF 70-75% severe LVH TAVR valvle with mild peri-valvular AI. No MR - Echo today 08/31/22: EF 70-75% severe LVH No MR Personally reviewed - Volume status stable. May be a bit dry. -> check labs  - Continue lasix 40 daily (may need to stop if labs suggest volume depletion) - Continue Entresto to 97/103 bid - Continue spiro 25 mg daily  - Continue bidil 2 tab tid.   - Continue carvedilol 6.25 mg BID  - Would avoid SGLT2i with UTIs and lack of circumcision  - Echo with severe LVH. Likely due in part to AS and HTN but need to exclude component of amyloid. Check cMRI    2. AS - Severe AS s/p TAVR 06/09/22 w/ Dr Excell Seltzer  - Echo 4/24 with mild peri-valvular AI - TAVR stable on echo today Personally reviewed - Discussed SBE prophylaxis (has dentures)    3. MR  - Mod-Severe  - Post TAVR echo with moderate MR -> follow - Echo 4/24 EF 70-75% TAVR valvle with mild peri-valvular AI. No  MR - No MR on echo today 08/31/22   4. Persistent A fib. -> NSR - He is now back in NSR - Decrease amio to 100 daily. Now that EF is better would consider eventually switching to another agent but not candidate for flecainide with structural heart disease ? Multaq - Continue eliquis 5 mg twice a day. No bleeding   5. CAD  - primarily non-obstructive with distal/branch vessel disease - no s/s angina - continue medical management   6. CKD 3a - Baseline Scr 1.4-1.5 - check labs today   7. PVCs/NSVT - On amio 200 mg daily. Decrease to 100 daily - Keep K > 4.0 Mg > 2.0   8. HTN -Well controlled   9. H/O Septic Knee 01/2022  - Coag negative staph. Completed daptomycin x 4 weeks then switched to linezolid 02/20/22 - resolved   Doing well. HF resolved. Can f/u with Dr. Bjorn Pippin.  Review of Systems  Constitutional:  Negative for chills, diaphoresis, fever, malaise/fatigue and weight loss.  HENT:  Negative for congestion, hearing loss, nosebleeds, sore throat and tinnitus.   Eyes:  Negative for blurred vision, photophobia and redness.  Respiratory:  Positive for shortness of breath. Negative for cough, hemoptysis, sputum production, wheezing and stridor.   Cardiovascular:  Negative for chest pain, palpitations, orthopnea, claudication, leg swelling and PND.  Gastrointestinal:  Negative for abdominal pain, blood in stool, constipation, diarrhea, heartburn, nausea and vomiting.  Genitourinary:  Negative for dysuria, flank pain, frequency, hematuria and urgency.  Musculoskeletal:  Negative for back pain, falls, joint pain, myalgias and neck pain.  Skin:  Negative for itching and rash.  Neurological:  Negative for dizziness, tingling, tremors, sensory change, speech change, focal weakness, seizures, loss of consciousness, weakness and headaches.  Endo/Heme/Allergies:  Negative for environmental allergies and polydipsia. Does not bruise/bleed easily.  Psychiatric/Behavioral:  Negative for  depression, memory loss, substance abuse and suicidal ideas. The patient is not nervous/anxious and does not have insomnia.         Objective:    There were no vitals taken for this visit.   Physical Exam Vitals reviewed.  Constitutional:      Appearance: Normal appearance. He is well-developed. He is not diaphoretic.  HENT:     Head: Normocephalic and atraumatic.     Nose: No nasal deformity, septal deviation, mucosal edema or rhinorrhea.     Right Sinus: No maxillary sinus tenderness or frontal sinus tenderness.     Left Sinus: No maxillary sinus tenderness or frontal sinus tenderness.     Mouth/Throat:     Pharynx: No oropharyngeal exudate.  Eyes:     General: No scleral icterus.    Conjunctiva/sclera: Conjunctivae normal.     Pupils: Pupils are equal, round, and reactive to light.  Neck:     Thyroid: No thyromegaly.     Vascular: No carotid bruit or JVD.     Trachea: Trachea normal. No tracheal tenderness or tracheal deviation.  Cardiovascular:     Rate and Rhythm: Normal rate and regular rhythm.     Chest Wall: PMI is not displaced.     Pulses: Normal pulses. No decreased pulses.     Heart sounds: Normal heart sounds, S1 normal and S2 normal. Heart sounds not distant. No murmur heard.    No systolic murmur is present.     No diastolic murmur is present.     No friction rub. No gallop. No S3 or S4 sounds.  Pulmonary:     Effort: No tachypnea, accessory muscle usage or respiratory distress.     Breath sounds: No stridor. No decreased breath sounds, wheezing, rhonchi or rales.  Chest:     Chest wall: No tenderness.  Abdominal:     General: Bowel sounds are normal. There is no distension.     Palpations: Abdomen is soft. Abdomen is not rigid.     Tenderness: There is no abdominal tenderness. There is no guarding or rebound.  Musculoskeletal:        General: Normal range of motion.     Cervical back: Normal range of motion and neck supple. No edema, erythema or  rigidity. No muscular tenderness. Normal range of motion.  Lymphadenopathy:     Head:     Right side of head: No submental or submandibular adenopathy.     Left side of head: No submental or submandibular adenopathy.     Cervical: No cervical adenopathy.  Skin:    General: Skin is warm and dry.     Coloration: Skin is not pale.     Findings: No rash.     Nails: There is no clubbing.  Neurological:     Mental Status: He is alert and oriented to person, place, and time.     Sensory: No sensory deficit.  Psychiatric:        Speech: Speech normal.        Behavior: Behavior normal.     No results found for any visits on 07/13/23.      Assessment & Plan:   Problem List Items Addressed This Visit   None    No orders of the defined types were placed in this encounter. 38 minutes spent on this encounter  No follow-ups on file.  [name], MD

## 2023-07-13 ENCOUNTER — Telehealth (HOSPITAL_BASED_OUTPATIENT_CLINIC_OR_DEPARTMENT_OTHER): Admitting: Critical Care Medicine

## 2023-07-13 ENCOUNTER — Encounter: Payer: Self-pay | Admitting: Critical Care Medicine

## 2023-07-13 ENCOUNTER — Ambulatory Visit: Attending: Critical Care Medicine | Admitting: Critical Care Medicine

## 2023-07-13 ENCOUNTER — Telehealth: Payer: Self-pay | Admitting: Critical Care Medicine

## 2023-07-13 ENCOUNTER — Ambulatory Visit: Admitting: Critical Care Medicine

## 2023-07-13 DIAGNOSIS — I350 Nonrheumatic aortic (valve) stenosis: Secondary | ICD-10-CM

## 2023-07-13 DIAGNOSIS — I2510 Atherosclerotic heart disease of native coronary artery without angina pectoris: Secondary | ICD-10-CM

## 2023-07-13 DIAGNOSIS — I5023 Acute on chronic systolic (congestive) heart failure: Secondary | ICD-10-CM

## 2023-07-13 DIAGNOSIS — I4891 Unspecified atrial fibrillation: Secondary | ICD-10-CM | POA: Diagnosis not present

## 2023-07-13 DIAGNOSIS — I4819 Other persistent atrial fibrillation: Secondary | ICD-10-CM

## 2023-07-13 DIAGNOSIS — I5033 Acute on chronic diastolic (congestive) heart failure: Secondary | ICD-10-CM

## 2023-07-13 DIAGNOSIS — Z8739 Personal history of other diseases of the musculoskeletal system and connective tissue: Secondary | ICD-10-CM

## 2023-07-13 DIAGNOSIS — I5032 Chronic diastolic (congestive) heart failure: Secondary | ICD-10-CM | POA: Diagnosis not present

## 2023-07-13 DIAGNOSIS — E782 Mixed hyperlipidemia: Secondary | ICD-10-CM

## 2023-07-13 DIAGNOSIS — I2583 Coronary atherosclerosis due to lipid rich plaque: Secondary | ICD-10-CM

## 2023-07-13 DIAGNOSIS — I480 Paroxysmal atrial fibrillation: Secondary | ICD-10-CM

## 2023-07-13 MED ORDER — GABAPENTIN 400 MG PO CAPS: ORAL_CAPSULE | ORAL | 2 refills | Status: DC

## 2023-07-13 MED ORDER — ATORVASTATIN CALCIUM 20 MG PO TABS: 20.0000 mg | ORAL_TABLET | Freq: Every day | ORAL | 0 refills | Status: DC

## 2023-07-13 MED ORDER — ENTRESTO 97-103 MG PO TABS: 1.0000 | ORAL_TABLET | Freq: Two times a day (BID) | ORAL | 0 refills | Status: DC

## 2023-07-13 MED ORDER — POTASSIUM CHLORIDE CRYS ER 20 MEQ PO TBCR: EXTENDED_RELEASE_TABLET | ORAL | 1 refills | Status: DC

## 2023-07-13 MED ORDER — CARVEDILOL 6.25 MG PO TABS: 6.2500 mg | ORAL_TABLET | Freq: Two times a day (BID) | ORAL | 2 refills | Status: DC

## 2023-07-13 MED ORDER — OMEPRAZOLE 40 MG PO CPDR: 40.0000 mg | DELAYED_RELEASE_CAPSULE | Freq: Every day | ORAL | 3 refills | Status: DC

## 2023-07-13 MED ORDER — ALLOPURINOL 100 MG PO TABS: 100.0000 mg | ORAL_TABLET | Freq: Every day | ORAL | 1 refills | Status: AC

## 2023-07-13 MED ORDER — ISOSORB DINITRATE-HYDRALAZINE 20-37.5 MG PO TABS: 2.0000 | ORAL_TABLET | Freq: Three times a day (TID) | ORAL | 0 refills | Status: DC

## 2023-07-13 MED ORDER — APIXABAN 5 MG PO TABS: 5.0000 mg | ORAL_TABLET | Freq: Two times a day (BID) | ORAL | 3 refills | Status: DC

## 2023-07-13 MED ORDER — FUROSEMIDE 40 MG PO TABS: 40.0000 mg | ORAL_TABLET | ORAL | 3 refills | Status: DC | PRN

## 2023-07-13 MED ORDER — AMIODARONE HCL 100 MG PO TABS: 100.0000 mg | ORAL_TABLET | Freq: Every day | ORAL | 0 refills | Status: DC

## 2023-07-13 NOTE — Assessment & Plan Note (Signed)
 All medications refilled and we will get patient in for in person visit in 1 week and referral back to cardiology given

## 2023-07-13 NOTE — Progress Notes (Unsigned)
 Acute Office Visit  Subjective:     Patient ID: [name], male    DOB: 03/28/1961, 63 y.o.   MRN: [id number]  No chief complaint on file.   HPI 11/25/22 This patient is seen today for evaluation of hypertension.  He was seen at the wound care center last week with blood pressures in the 180/130 range.  We had just seen him in June and make changes in his blood pressure medications.  He states he has been compliant with medications as prescribed.  I called him yesterday on the phone and told him to increase his hydralazine to 100 mg 3 times daily.  All other medications are the same.  Below is documentation from the last cardiology visit Aortic stenosis: Echocardiogram 03/2021 showed severe asymmetric LVH, EF 70 to 75%, severe aortic stenosis (Vmax 4 m/s, mean gradient 33 mmHg, AVA 0.9 to 1 cm).  LHC/RHC on 05/27/2021 showed 90% distal LAD stenosis, 90% D2 stenosis, otherwise nonobstructive CAD; RA 14, RV 42/12, PA 44/22/27, PCWP 19. -Previously has been asymptomatic but now reporting dyspnea with exertion.  Seen by Dr. Laneta Simmers, planning surgical AVR.  Was scheduled for April 2023, but cancelled as patient had developed worsening lower extremity edema and wounds on legs.  Will need wounds to heal before undergoing surgery.  Found to have bilateral PAD which is likely affecting wound healing.  Underwent angiography on 10/22/2021, and underwent angioplasty to left tibioperoneal trunk and left peroneal artery.   Chronic diastolic heart failure: Echocardiogram on 04/16/2020 showed LVEF 70 to 75%, moderate LVH, grade 2 diastolic dysfunction, moderate to severe aortic stenosis (Vmax 4.0 m/s, mean gradient 38 mmHg, AVA 1.1 cm, DI 0.32).  On Lasix 40 mg thrice daily -Continue lasix.   Will check BMET, magnesium   PAD: Lower extremity duplex 09/02/2021 showed right 50 to 74% stenosis in SFA, bilateral iliac occlusive disease, diminished flow in tibial vessels bilaterally.  Seen by Dr. Edilia Bo with [facility].   Underwent angiography on 10/22/2021, and underwent angioplasty to left tibioperoneal trunk and left peroneal artery.   LVH: Severe asymmetric LVH on echo, likely due to severe AS.  Cardiac MRI on 05/03/2021 showed severe LVH measuring up to 20 mm and basal septum (16 mm and posterior wall); no evidence of amyloidosis, and while meets criteria for HCM, likely is secondary to severe aortic stenosis.  There was patchy LGE in septum and RV insertion site accounting for 3% of total myocardial mass.    Atrial fibrillation: in A. fib at prior clinic visit.  Severe biatrial enlargement on echo, may be difficult to obtain rhythm control without antiarrhythmic.  CHA2DS2-VASc score 2 (hypertension, CHF) can just do -Continue Eliquis 5 mg twice daily.  He has also been taking ASA, can discontinue since on Eliquis -Continue Coreg 25 mg twice daily.   -Seen in A-fib clinic, recommended rate control strategy for now while undergoing valve work-up.  If undergoing surgical AVR, could undergo MAZE procedure   Resistant hypertension: on carvedilol 25 mg twice daily, clonidine 0.2 mg 3 times daily, Lasix 40 mg thrice daily, hydralazine 50 mg 3 times daily, olmesartan 40 mg daily.  Work-up for secondary causes includes no evidence of renal artery stenosis on duplex.  Elevated aldosterone/renin ratio but normal aldosterone level argues against hyperaldosteronism.  Normal TSH.  Sleep study shows severe OSA, suspect this is contributing.  Started on CPAP.  Was on amlodipine but discontinued due to edema -BP elevated.  Will check BMET, if stable plan to add  spironolactone.  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  Denies any abdominal pain or unintentional weight loss.  Continue to monitor.   Hyperlipidemia: On atorvastatin 20 mg daily.  LDL 46 on 08/23/2021   OSA:  Severe OSA on sleep study, starting on CPAP.   QT prolongation: QTC 500 at prior clinic visit, most recent EKG with QTC 469, will continue to monitor     RTC in 6 weeks   The patient does have aortic stenosis but cannot have aortic valve replacement until his medical condition stabilizes the patient is had peripheral artery disease treated already   With the peripheral artery disease he has had stents placed in the left leg.  Wounds are improving from this.  Patient does note some shortness of breath with exertion.  There is no other complaints at this time.   07/13/23 HF clinic 08/2022  Chronic systolic HF with recovered EF - Echo 3/1/24LV EF 40-45% severe AS severe MR - Echo 11/23 EF 55% severe AS mild MR - Cath 3/23: LAD apical 90% D2 90% LCx 50%d RCA 40%p (Medical rx) - Admitted 3/24 with marked volume overload and cardiogenic shock - RHC 06/03/22: RA 17 PA 72/41 (50) PCWP 30 (v=45) FICK CI 2.1 Thermo 1.6. Started on milrinone and IV lasix - s/p TAVR 3/7 - Post-TAVR echo 06/09/21 EF 60-65% mod MR - Echo 4/24 EF 70-75% severe LVH TAVR valvle with mild peri-valvular AI. No MR - Echo today 08/31/22: EF 70-75% severe LVH No MR Personally reviewed - Volume status stable. May be a bit dry. -> check labs  - Continue lasix 40 daily (may need to stop if labs suggest volume depletion) - Continue Entresto to 97/103 bid - Continue spiro 25 mg daily  - Continue bidil 2 tab tid.   - Continue carvedilol 6.25 mg BID  - Would avoid SGLT2i with UTIs and lack of circumcision  - Echo with severe LVH. Likely due in part to AS and HTN but need to exclude component of amyloid. Check cMRI    2. AS - Severe AS s/p TAVR 06/09/22 w/ Dr Excell Seltzer  - Echo 4/24 with mild peri-valvular AI - TAVR stable on echo today Personally reviewed - Discussed SBE prophylaxis (has dentures)    3. MR  - Mod-Severe  - Post TAVR echo with moderate MR -> follow - Echo 4/24 EF 70-75% TAVR valvle with mild peri-valvular AI. No  MR - No MR on echo today 08/31/22   4. Persistent A fib. -> NSR - He is now back in NSR - Decrease amio to 100 daily. Now that EF is better would consider eventually switching to another agent but not candidate for flecainide with structural heart disease ? Multaq - Continue eliquis 5 mg twice a day. No bleeding   5. CAD  - primarily non-obstructive with distal/branch vessel disease - no s/s angina - continue medical management   6. CKD 3a - Baseline Scr 1.4-1.5 - check labs today   7. PVCs/NSVT - On amio 200 mg daily. Decrease to 100 daily - Keep K > 4.0 Mg > 2.0   8. HTN -Well controlled   9. H/O Septic Knee 01/2022  - Coag negative staph. Completed daptomycin x 4 weeks then switched to linezolid 02/20/22 - resolved   Doing well. HF resolved. Can f/u with Dr. Bjorn Pippin.  Review of Systems  Constitutional:  Negative for chills, diaphoresis, fever, malaise/fatigue and weight loss.  HENT:  Negative for congestion, hearing loss, nosebleeds, sore throat and tinnitus.   Eyes:  Negative for blurred vision, photophobia and redness.  Respiratory:  Positive for shortness of breath. Negative for cough, hemoptysis, sputum production, wheezing and stridor.   Cardiovascular:  Negative for chest pain, palpitations, orthopnea, claudication, leg swelling and PND.  Gastrointestinal:  Negative for abdominal pain, blood in stool, constipation, diarrhea, heartburn, nausea and vomiting.  Genitourinary:  Negative for dysuria, flank pain, frequency, hematuria and urgency.  Musculoskeletal:  Negative for back pain, falls, joint pain, myalgias and neck pain.  Skin:  Negative for itching and rash.  Neurological:  Negative for dizziness, tingling, tremors, sensory change, speech change, focal weakness, seizures, loss of consciousness, weakness and headaches.  Endo/Heme/Allergies:  Negative for environmental allergies and polydipsia. Does not bruise/bleed easily.  Psychiatric/Behavioral:  Negative for  depression, memory loss, substance abuse and suicidal ideas. The patient is not nervous/anxious and does not have insomnia.         Objective:    There were no vitals taken for this visit.   Physical Exam Vitals reviewed.  Constitutional:      Appearance: Normal appearance. He is well-developed. He is not diaphoretic.  HENT:     Head: Normocephalic and atraumatic.     Nose: No nasal deformity, septal deviation, mucosal edema or rhinorrhea.     Right Sinus: No maxillary sinus tenderness or frontal sinus tenderness.     Left Sinus: No maxillary sinus tenderness or frontal sinus tenderness.     Mouth/Throat:     Pharynx: No oropharyngeal exudate.  Eyes:     General: No scleral icterus.    Conjunctiva/sclera: Conjunctivae normal.     Pupils: Pupils are equal, round, and reactive to light.  Neck:     Thyroid: No thyromegaly.     Vascular: No carotid bruit or JVD.     Trachea: Trachea normal. No tracheal tenderness or tracheal deviation.  Cardiovascular:     Rate and Rhythm: Normal rate and regular rhythm.     Chest Wall: PMI is not displaced.     Pulses: Normal pulses. No decreased pulses.     Heart sounds: Normal heart sounds, S1 normal and S2 normal. Heart sounds not distant. No murmur heard.    No systolic murmur is present.     No diastolic murmur is present.     No friction rub. No gallop. No S3 or S4 sounds.  Pulmonary:     Effort: No tachypnea, accessory muscle usage or respiratory distress.     Breath sounds: No stridor. No decreased breath sounds, wheezing, rhonchi or rales.  Chest:     Chest wall: No tenderness.  Abdominal:     General: Bowel sounds are normal. There is no distension.     Palpations: Abdomen is soft. Abdomen is not rigid.     Tenderness: There is no abdominal tenderness. There is no guarding or rebound.  Musculoskeletal:        General: Normal range of motion.     Cervical back: Normal range of motion and neck supple. No edema, erythema or  rigidity. No muscular tenderness. Normal range of motion.  Lymphadenopathy:     Head:     Right side of head: No submental or submandibular adenopathy.     Left side of head: No submental or submandibular adenopathy.     Cervical: No cervical adenopathy.  Skin:    General: Skin is warm and dry.     Coloration: Skin is not pale.     Findings: No rash.     Nails: There is no clubbing.  Neurological:     Mental Status: He is alert and oriented to person, place, and time.     Sensory: No sensory deficit.  Psychiatric:        Speech: Speech normal.        Behavior: Behavior normal.     No results found for any visits on 07/13/23.      Assessment & Plan:   Problem List Items Addressed This Visit       Cardiovascular and Mediastinum   Severe aortic stenosis   Relevant Medications   amiodarone (PACERONE) 100 MG tablet   furosemide (LASIX) 40 MG tablet   apixaban (ELIQUIS) 5 MG TABS tablet   carvedilol (COREG) 6.25 MG tablet   sacubitril-valsartan (ENTRESTO) 97-103 MG   atorvastatin (LIPITOR) 20 MG tablet   isosorbide-hydrALAZINE (BIDIL) 20-37.5 MG tablet     Other   Hyperlipemia   Relevant Medications   amiodarone (PACERONE) 100 MG tablet   furosemide (LASIX) 40 MG tablet   apixaban (ELIQUIS) 5 MG TABS tablet   carvedilol (COREG) 6.25 MG tablet   sacubitril-valsartan (ENTRESTO) 97-103 MG   atorvastatin (LIPITOR) 20 MG tablet   isosorbide-hydrALAZINE (BIDIL) 20-37.5 MG tablet   Other Visit Diagnoses       History of gout       Relevant Medications   allopurinol (ZYLOPRIM) 100 MG tablet     Acute on chronic diastolic (congestive) heart failure (HCC)       Relevant Medications   amiodarone (PACERONE) 100 MG tablet   furosemide (LASIX) 40 MG tablet   apixaban (ELIQUIS) 5 MG TABS tablet   carvedilol (COREG) 6.25 MG tablet   sacubitril-valsartan (ENTRESTO) 97-103 MG   atorvastatin (LIPITOR) 20 MG tablet   isosorbide-hydrALAZINE (BIDIL) 20-37.5 MG tablet         Meds ordered this encounter  Medications   amiodarone (PACERONE) 100 MG tablet    Sig: Take 1 tablet (100 mg total) by mouth daily.    Dispense:  90 tablet    Refill:  0   omeprazole (PRILOSEC) 40 MG capsule    Sig: Take 1 capsule (40 mg total) by mouth daily.    Dispense:  90 capsule    Refill:  3   allopurinol (ZYLOPRIM) 100 MG tablet    Sig: Take 1 tablet (100 mg total) by mouth daily.    Dispense:  90 tablet    Refill:  1   furosemide (LASIX) 40 MG tablet    Sig: Take 1 tablet (40 mg total) by mouth as needed for fluid.    Dispense:  30 tablet    Refill:  3   apixaban (ELIQUIS) 5 MG TABS tablet    Sig: Take 1 tablet (5 mg total) by mouth 2 (two) times daily.    Dispense:  180 tablet    Refill:  3   carvedilol (COREG) 6.25 MG tablet    Sig: Take 1 tablet (6.25 mg total) by mouth 2 (two) times daily with a meal. Future refill    Dispense:  60 tablet    Refill:  2   sacubitril-valsartan (ENTRESTO) 97-103 MG    Sig: Take 1 tablet by mouth 2 (two) times daily. Any further refills should come from Dr Bjorn Pippin or Dr Gala Romney    Dispense:  60  tablet    Refill:  0    Future refill   atorvastatin (LIPITOR) 20 MG tablet    Sig: Take 1 tablet (20 mg total) by mouth daily.    Dispense:  90 tablet    Refill:  0   gabapentin (NEURONTIN) 400 MG capsule    Sig: TAKE 2 CAPSULES(800 MG) BY MOUTH THREE TIMES DAILY    Dispense:  180 capsule    Refill:  2   isosorbide-hydrALAZINE (BIDIL) 20-37.5 MG tablet    Sig: Take 2 tablets by mouth 3 (three) times daily.    Dispense:  180 tablet    Refill:  0    Future refill   potassium chloride SA (KLOR-CON M) 20 MEQ tablet    Sig: Take 1 daily by mouth as needed when furosemide is taken    Dispense:  30 tablet    Refill:  1  38 minutes spent on this encounter  No follow-ups on file.  [name], MD

## 2023-07-13 NOTE — Telephone Encounter (Signed)
 Patient visit had to be canceled I reached out to the patient and contacted him and went over his medications and refills were given see full note also filed today from the encounter

## 2023-07-13 NOTE — Progress Notes (Addendum)
 Acute Office Visit  Subjective:     Patient ID: [name], male    DOB: 03/21/1961, 63 y.o.   MRN: [id number]  Virtual Visit via Telephone Note  I connected with [name] on 07/13/23 at  9:50 AM EDT by telephone and verified that I am speaking with the correct person using two identifiers.   Consent:  I discussed the limitations, risks, security and privacy concerns of performing an evaluation and management service by telephone and the availability of in person appointments. I also discussed with the patient that there may be a patient responsible charge related to this service. The patient expressed understanding and agreed to proceed.  Location of patient: Patient is at home  Location of provider: I am in my office  Persons participating in the televisit with the patient.   No one else in call     HPI 11/25/22 This patient is seen today for evaluation of hypertension.  He was seen at the wound care center last week with blood pressures in the 180/130 range.  We had just seen him in June and make changes in his blood pressure medications.  He states he has been compliant with medications as prescribed.  I called him yesterday on the phone and told him to increase his hydralazine to 100 mg 3 times daily.  All other medications are the same.  Below is documentation from the last cardiology visit Aortic stenosis: Echocardiogram 03/2021 showed severe asymmetric LVH, EF 70 to 75%, severe aortic stenosis (Vmax 4 m/s, mean gradient 33 mmHg, AVA 0.9 to 1 cm).  LHC/RHC on 05/27/2021 showed 90% distal LAD stenosis, 90% D2 stenosis, otherwise nonobstructive CAD; RA 14, RV 42/12, PA 44/22/27, PCWP 19. -Previously has been asymptomatic but now reporting dyspnea with exertion.  Seen by Dr. Laneta Simmers, planning surgical AVR.  Was scheduled for April 2023, but cancelled as patient had developed worsening lower extremity edema and wounds on legs.  Will need wounds to heal before undergoing surgery.   Found to have bilateral PAD which is likely affecting wound healing.  Underwent angiography on 10/22/2021, and underwent angioplasty to left tibioperoneal trunk and left peroneal artery.   Chronic diastolic heart failure: Echocardiogram on 04/16/2020 showed LVEF 70 to 75%, moderate LVH, grade 2 diastolic dysfunction, moderate to severe aortic stenosis (Vmax 4.0 m/s, mean gradient 38 mmHg, AVA 1.1 cm, DI 0.32).  On Lasix 40 mg thrice daily -Continue lasix.   Will check BMET, magnesium   PAD: Lower extremity duplex 09/02/2021 showed right 50 to 74% stenosis in SFA, bilateral iliac occlusive disease, diminished flow in tibial vessels bilaterally.  Seen by Dr. Edilia Bo with [facility].  Underwent angiography on 10/22/2021, and underwent angioplasty to left tibioperoneal trunk and left peroneal artery.   LVH: Severe asymmetric LVH on echo, likely due to severe AS.  Cardiac MRI on 05/03/2021 showed severe LVH measuring up to 20 mm and basal septum (16 mm and posterior wall); no evidence of amyloidosis, and while meets criteria for HCM, likely is secondary to severe aortic stenosis.  There was patchy LGE in septum and RV insertion site accounting for 3% of total myocardial mass.    Atrial fibrillation: in A. fib at prior clinic visit.  Severe biatrial enlargement on echo, may be difficult to obtain rhythm control without antiarrhythmic.  CHA2DS2-VASc score 2 (hypertension, CHF) can just do -Continue Eliquis 5 mg twice daily.  He has also been taking ASA, can discontinue since on Eliquis -Continue Coreg 25 mg twice daily.   -  Seen in A-fib clinic, recommended rate control strategy for now while undergoing valve work-up.  If undergoing surgical AVR, could undergo MAZE procedure   Resistant hypertension: on carvedilol 25 mg twice daily, clonidine 0.2 mg 3 times daily, Lasix 40 mg thrice daily, hydralazine 50 mg 3 times daily, olmesartan 40 mg daily.  Work-up for secondary causes includes no evidence of renal artery stenosis  on duplex.  Elevated aldosterone/renin ratio but normal aldosterone level argues against hyperaldosteronism.  Normal TSH.  Sleep study shows severe OSA, suspect this is contributing.  Started on CPAP.  Was on amlodipine but discontinued due to edema -BP elevated.  Will check BMET, if stable plan to add spironolactone.  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  Denies any abdominal pain or unintentional weight loss.  Continue to monitor.   Hyperlipidemia: On atorvastatin 20 mg daily.  LDL 46 on 08/23/2021   OSA: Severe OSA on sleep study, starting on CPAP.   QT prolongation: QTC 500 at prior clinic visit, most recent EKG with QTC 469, will continue to monitor     RTC in 6 weeks   The patient does have aortic stenosis but cannot have aortic valve replacement until his medical condition stabilizes the patient is had peripheral artery disease treated already   With the peripheral artery disease he has had stents placed in the left leg.  Wounds are improving from this.  Patient does note some shortness of breath with exertion.  There is no other complaints at this time.   07/13/23 HF clinic 08/2022  Chronic systolic HF with recovered EF - Echo 3/1/24LV EF 40-45% severe AS severe MR - Echo 11/23 EF 55% severe AS mild MR - Cath 3/23: LAD apical 90% D2 90% LCx 50%d RCA 40%p (Medical rx) - Admitted 3/24 with marked volume overload and cardiogenic shock - RHC 06/03/22: RA 17 PA 72/41 (50) PCWP 30 (v=45) FICK CI 2.1 Thermo 1.6. Started on milrinone and IV lasix - s/p TAVR 3/7 - Post-TAVR echo 06/09/21 EF 60-65% mod MR - Echo 4/24 EF 70-75% severe LVH TAVR valvle with mild peri-valvular AI. No MR - Echo today 08/31/22: EF 70-75% severe LVH No MR Personally reviewed - Volume status stable. May be a bit dry. -> check labs  - Continue lasix 40  daily (may need to stop if labs suggest volume depletion) - Continue Entresto to 97/103 bid - Continue spiro 25 mg daily  - Continue bidil 2 tab tid.   - Continue carvedilol 6.25 mg BID  - Would avoid SGLT2i with UTIs and lack of circumcision  - Echo with severe LVH. Likely due in part to AS and HTN but need to exclude component of amyloid. Check cMRI    2. AS - Severe AS s/p TAVR 06/09/22 w/ Dr Excell Seltzer  - Echo 4/24 with mild peri-valvular AI - TAVR stable on echo today Personally reviewed - Discussed SBE prophylaxis (has dentures)    3. MR  - Mod-Severe  - Post TAVR echo with moderate MR -> follow - Echo 4/24 EF 70-75% TAVR valvle with mild peri-valvular AI. No MR - No MR on echo today 08/31/22   4. Persistent A fib. -> NSR - He is now back in NSR - Decrease amio to 100 daily. Now that EF is better would consider eventually switching to another agent but  not candidate for flecainide with structural heart disease ? Multaq - Continue eliquis 5 mg twice a day. No bleeding   5. CAD  - primarily non-obstructive with distal/branch vessel disease - no s/s angina - continue medical management   6. CKD 3a - Baseline Scr 1.4-1.5 - check labs today   7. PVCs/NSVT - On amio 200 mg daily. Decrease to 100 daily - Keep K > 4.0 Mg > 2.0   8. HTN -Well controlled   9. H/O Septic Knee 01/2022  - Coag negative staph. Completed daptomycin x 4 weeks then switched to linezolid 02/20/22 - resolved     Review of Systems  Constitutional:  Negative for chills, diaphoresis, fever, malaise/fatigue and weight loss.  HENT:  Negative for congestion, hearing loss, nosebleeds, sore throat and tinnitus.   Eyes:  Negative for blurred vision, photophobia and redness.  Respiratory:  Positive for shortness of breath. Negative for cough, hemoptysis, sputum production, wheezing and stridor.   Cardiovascular:  Negative for chest pain, palpitations, orthopnea, claudication, leg swelling and PND.   Gastrointestinal:  Negative for abdominal pain, blood in stool, constipation, diarrhea, heartburn, nausea and vomiting.  Genitourinary:  Negative for dysuria, flank pain, frequency, hematuria and urgency.  Musculoskeletal:  Negative for back pain, falls, joint pain, myalgias and neck pain.  Skin:  Negative for itching and rash.  Neurological:  Negative for dizziness, tingling, tremors, sensory change, speech change, focal weakness, seizures, loss of consciousness, weakness and headaches.  Endo/Heme/Allergies:  Negative for environmental allergies and polydipsia. Does not bruise/bleed easily.  Psychiatric/Behavioral:  Negative for depression, memory loss, substance abuse and suicidal ideas. The patient is not nervous/anxious and does not have insomnia.         Objective:    There were no vitals taken for this visit.  No exam this was a telephone visit   No results found for any visits on 07/13/23.      Assessment & Plan:   Problem List Items Addressed This Visit       Cardiovascular and Mediastinum   Acute on chronic systolic CHF (congestive heart failure) (HCC)   Persistent atrial fibrillation (HCC)   Atrial fibrillation with RVR (HCC)   Relevant Orders   Ambulatory referral to Cardiology   CAD (coronary artery disease)   Relevant Orders   Ambulatory referral to Cardiology   (HFpEF) heart failure with preserved ejection fraction (HCC) - Primary   All medications refilled and we will get patient in for in person visit in 1 week and referral back to cardiology given      Relevant Orders   Ambulatory referral to Cardiology   Atrial fibrillation [facility])   Relevant Orders   Ambulatory referral to Cardiology     No orders of the defined types were placed in this encounter.  Follow Up Instructions: Refills on all medications given and referral to cardiology made will have the patient posted for an in person visit in 1 week for primary care   I discussed the assessment  and treatment plan with the patient. The patient was provided an opportunity to ask questions and all were answered. The patient agreed with the plan and demonstrated an understanding of the instructions.   The patient was advised to call back or seek an in-person evaluation if the symptoms worsen or if the condition fails to improve as anticipated.  I provided 20 minutes of non-face-to-face time during this encounter  including  median intraservice time , review of notes, labs, imaging, medications  and explaining diagnosis and management to the patient .    [name], MD   [name], MD

## 2023-07-13 NOTE — Addendum Note (Signed)
 Addended by: Storm Frisk on: 07/13/2023 04:43 PM   Modules accepted: Level of Service

## 2023-07-14 ENCOUNTER — Encounter: Payer: Self-pay | Admitting: Critical Care Medicine

## 2023-07-14 NOTE — Progress Notes (Signed)
 Duplicate note see other encounter

## 2023-07-17 NOTE — Progress Notes (Addendum)
 Acute Office Visit  Subjective:     Patient ID: [name], male    DOB: [birth year], 63 y.o.   MRN: [id number]  HPI 11/25/22 This patient is seen today for evaluation of hypertension.  He was seen at the wound care center last week with blood pressures in the 180/130 range.  We had just seen him in June and make changes in his blood pressure medications.  He states he has been compliant with medications as prescribed.  I called him yesterday on the phone and told him to increase his hydralazine  to 100 mg 3 times daily.  All other medications are the same.  Below is documentation from the last cardiology visit Aortic stenosis: Echocardiogram 03/2021 showed severe asymmetric LVH, EF 70 to 75%, severe aortic stenosis (Vmax 4 m/s, mean gradient 33 mmHg, AVA 0.9 to 1 cm).  LHC/RHC on 05/27/2021 showed 90% distal LAD stenosis, 90% D2 stenosis, otherwise nonobstructive CAD; RA 14, RV 42/12, PA 44/22/27, PCWP 19. -Previously has been asymptomatic but now reporting dyspnea with exertion.  Seen by Dr. Sherene Dilling, planning surgical AVR.  Was scheduled for April 2023, but cancelled as patient had developed worsening lower extremity edema and wounds on legs.  Will need wounds to heal before undergoing surgery.  Found to have bilateral PAD which is likely affecting wound healing.  Underwent angiography on 10/22/2021, and underwent angioplasty to left tibioperoneal trunk and left peroneal artery.   Chronic diastolic heart failure: Echocardiogram on 04/16/2020 showed LVEF 70 to 75%, moderate LVH, grade 2 diastolic dysfunction, moderate to severe aortic stenosis (Vmax 4.0 m/s, mean gradient 38 mmHg, AVA 1.1 cm, DI 0.32).  On Lasix  40 mg thrice daily -Continue lasix .   Will check BMET, magnesium    PAD: Lower extremity duplex 09/02/2021 showed right 50 to 74% stenosis in SFA, bilateral iliac occlusive disease, diminished flow in tibial vessels bilaterally.  Seen by Dr. Shaunna Delaware with [facility].  Underwent angiography on 10/22/2021,  and underwent angioplasty to left tibioperoneal trunk and left peroneal artery.   LVH: Severe asymmetric LVH on echo, likely due to severe AS.  Cardiac MRI on 05/03/2021 showed severe LVH measuring up to 20 mm and basal septum (16 mm and posterior wall); no evidence of amyloidosis, and while meets criteria for HCM, likely is secondary to severe aortic stenosis.  There was patchy LGE in septum and RV insertion site accounting for 3% of total myocardial mass.    Atrial fibrillation: in A. fib at prior clinic visit.  Severe biatrial enlargement on echo, may be difficult to obtain rhythm control without antiarrhythmic.  CHA2DS2-VASc score 2 (hypertension, CHF) can just do -Continue Eliquis  5 mg twice daily.  He has also been taking ASA, can discontinue since on Eliquis  -Continue Coreg  25 mg twice daily.   -Seen in A-fib clinic, recommended rate control strategy for now while undergoing valve work-up.  If undergoing surgical AVR, could undergo MAZE procedure   Resistant hypertension: on carvedilol  25 mg twice daily, clonidine  0.2 mg 3 times daily, Lasix  40 mg thrice daily, hydralazine  50 mg 3 times daily, olmesartan  40 mg daily.  Work-up for secondary causes includes no evidence of renal artery stenosis on duplex.  Elevated aldosterone/renin ratio but normal aldosterone level argues against hyperaldosteronism.  Normal TSH.  Sleep study shows severe OSA, suspect this is contributing.  Started on CPAP.  Was on amlodipine  but discontinued due to edema -BP elevated.  Will check BMET, if stable plan to add spironolactone .  Will follow-up with pharmacy hypertension  clinic in 2 weeks for further management   SMA stenosis: Renal artery duplex showed no evidence of renal artery stenosis, but noted to have 70-99% stenosis in SMA.  Denies any abdominal pain or unintentional weight loss.  Continue to monitor.   Hyperlipidemia: On atorvastatin  20 mg daily.  LDL 46 on 08/23/2021   OSA: Severe OSA on sleep study, starting  on CPAP.   QT prolongation: QTC 500 at prior clinic visit, most recent EKG with QTC 469, will continue to monitor     RTC in 6 weeks   The patient does have aortic stenosis but cannot have aortic valve replacement until his medical condition stabilizes the patient is had peripheral artery disease treated already   With the peripheral artery disease he has had stents placed in the left leg.  Wounds are improving from this.  Patient does note some shortness of breath with exertion.  There is no other complaints at this time.   07/20/23 This patient is seen in person today I saw him last week by way of the telephone call.  I was not able to work that day in the clinic.  The patient comes in today with elevated blood pressure 216/123.  He does not measure blood pressure at home and has not taken medications today.  Last week I refilled all his medications and he states he is taking them as prescribed.  He also needs follow-up with cardiology heart failure clinic last note is as below in May 2024. He denies any chest pain headache shortness of breath or other symptoms.  He has not had frequent urinary tract infections and would benefit from an SGLT2 however cardiology was reluctant to give this because of the urinary tract infections he states the gabapentin  causes side effects this will be held. HF clinic 08/2022  Chronic systolic HF with recovered EF - Echo 3/1/24LV EF 40-45% severe AS severe MR - Echo 11/23 EF 55% severe AS mild MR - Cath 3/23: LAD apical 90% D2 90% LCx 50%d RCA 40%p (Medical rx) - Admitted 3/24 with marked volume overload and cardiogenic shock - RHC 06/03/22: RA 17 PA 72/41 (50) PCWP 30 (v=45) FICK CI 2.1 Thermo 1.6. Started on milrinone  and IV lasix  - s/p TAVR 3/7 - Post-TAVR echo 06/09/21 EF 60-65% mod MR - Echo 4/24 EF 70-75% severe LVH TAVR valvle with mild peri-valvular AI. No MR - Echo today 08/31/22: EF 70-75% severe LVH No MR Personally reviewed - Volume status stable. May  be a bit dry. -> check labs  - Continue lasix  40 daily (may need to stop if labs suggest volume depletion) - Continue Entresto  to 97/103 bid - Continue spiro 25 mg daily  - Continue bidil  2 tab tid.   - Continue carvedilol  6.25 mg BID  - Would avoid SGLT2i with UTIs and lack of circumcision  - Echo with severe LVH. Likely due in part to AS and HTN but need to exclude component of amyloid. Check cMRI    2. AS - Severe AS s/p TAVR 06/09/22 w/ Dr Arlester Ladd  - Echo 4/24 with mild peri-valvular AI - TAVR stable on echo today Personally reviewed - Discussed SBE prophylaxis (has dentures)    3. MR  - Mod-Severe  - Post TAVR echo with moderate MR -> follow - Echo 4/24 EF 70-75% TAVR valvle with mild peri-valvular AI. No MR - No MR on echo today 08/31/22   4. Persistent A fib. -> NSR - He is now back in NSR -  Decrease amio to 100 daily. Now that EF is better would consider eventually switching to another agent but not candidate for flecainide with structural heart disease ? Multaq - Continue eliquis  5 mg twice a day. No bleeding   5. CAD  - primarily non-obstructive with distal/branch vessel disease - no s/s angina - continue medical management   6. CKD 3a - Baseline Scr 1.4-1.5 - check labs today   7. PVCs/NSVT - On amio 200 mg daily. Decrease to 100 daily - Keep K > 4.0 Mg > 2.0   8. HTN -Well controlled   9. H/O Septic Knee 01/2022  - Coag negative staph. Completed daptomycin  x 4 weeks then switched to linezolid  02/20/22 - resolved    07/20/23  Review of Systems  Constitutional:  Negative for chills, diaphoresis, fever, malaise/fatigue and weight loss.  HENT:  Negative for congestion, hearing loss, nosebleeds, sore throat and tinnitus.   Eyes:  Negative for blurred vision, photophobia and redness.  Respiratory:  Negative for cough, hemoptysis, sputum production, shortness of breath, wheezing and stridor.   Cardiovascular:  Negative for chest pain, palpitations, orthopnea,  claudication, leg swelling and PND.  Gastrointestinal:  Negative for abdominal pain, blood in stool, constipation, diarrhea, heartburn, nausea and vomiting.  Genitourinary:  Negative for dysuria, flank pain, frequency, hematuria and urgency.  Musculoskeletal:  Negative for back pain, falls, joint pain, myalgias and neck pain.  Skin:  Negative for itching and rash.  Neurological:  Negative for dizziness, tingling, tremors, sensory change, speech change, focal weakness, seizures, loss of consciousness, weakness and headaches.  Endo/Heme/Allergies:  Negative for environmental allergies and polydipsia. Does not bruise/bleed easily.  Psychiatric/Behavioral:  Negative for depression, memory loss, substance abuse and suicidal ideas. The patient is not nervous/anxious and does not have insomnia.         Objective:    BP (!) 216/123 (BP Location: Left Arm, Patient Position: Sitting, Cuff Size: Normal)   Pulse 71   Resp 19   Ht 5\' 9"  (1.753 m)   Wt 177 lb 6.4 oz (80.5 kg)   SpO2 98%   BMI 26.20 kg/m   Vitals:   07/20/23 1048 07/20/23 1128 07/20/23 1143  BP: (!) 212/129 (!) 218/115 (!) 216/123  Pulse: 71    Resp: 19    SpO2: 98%    Weight: 177 lb 6.4 oz (80.5 kg)    Height: 5\' 9"  (1.753 m)      Gen: Pleasant, well-nourished, in no distress,  normal affect  ENT: No lesions,  mouth clear,  oropharynx clear, no postnasal drip  Neck: No JVD, no TMG, no carotid bruits  Lungs: No use of accessory muscles, no dullness to percussion, clear without rales or rhonchi  Cardiovascular: RRR, heart sounds normal, no murmur or gallops, no peripheral edema  Abdomen: soft and NT, no HSM,  BS normal  Musculoskeletal: No deformities, no cyanosis or clubbing  Neuro: alert, non focal  Skin: Warm, no lesions or rashes  No results found.   Results for orders placed or performed in visit on 07/20/23  [facility]  Result Value Ref Range   Glucose 103 (H) 70 - 99 mg/dL   BUN 20 8 - 27 mg/dL    Creatinine, Ser 1.61 (H) 0.76 - 1.27 mg/dL   eGFR 56 (L) >09 UE/AVW/0.98   BUN/Creatinine Ratio 14 10 - 24   Sodium 138 134 - 144 mmol/L   Potassium 6.5 (H) 3.5 - 5.2 mmol/L   Chloride 106 96 - 106 mmol/L  CO2 20 20 - 29 mmol/L   Calcium  9.3 8.6 - 10.2 mg/dL        Assessment & Plan:   Problem List Items Addressed This Visit       Cardiovascular and Mediastinum   HTN (hypertension) (Chronic)   Poorly controlled 2 doses of 0.1 mg clonidine  given with minimal effect  Plan to increase carvedilol  to 25 mg twice daily begin amlodipine  5 mg daily continue Entresto  twice daily as prescribed and BiDil  and will add chlorthalidone  50 mg daily Patient advised to go to the emergency room if symptoms increase Patient needs repeat metabolic panel when he was in the emergency room potassium was 3.3.  He has not been taking any the furosemide .  He already has the apixaban  and the atorvastatin  and refills on the Entresto  and BiDil .  He needs follow-up visit with cardiology. Patient was given a blood pressure meter by way of Medicaid at [facility] pharmacy he will also come back to see our clinical pharmacist in 1 week for blood pressure      Relevant Medications   carvedilol  (COREG ) 25 MG tablet   amLODipine  (NORVASC ) 5 MG tablet   chlorthalidone  (HYGROTON ) 50 MG tablet   Other Relevant Orders   BMP8+eGFR (Completed)   Atherosclerosis of artery of extremity with ulceration (HCC)   Resolved      Relevant Medications   carvedilol  (COREG ) 25 MG tablet   amLODipine  (NORVASC ) 5 MG tablet   chlorthalidone  (HYGROTON ) 50 MG tablet   Cardiac amyloidosis (HCC)   Management per cardiology      Relevant Medications   carvedilol  (COREG ) 25 MG tablet   amLODipine  (NORVASC ) 5 MG tablet   chlorthalidone  (HYGROTON ) 50 MG tablet     Genitourinary   Chronic kidney disease, stage 3a (HCC)   Relevant Orders   BMP8+eGFR (Completed)   Chronic kidney disease, stage 3b (HCC)   Reassess renal function         Other   Hyperkalemia - Primary   Labs returned this morning this is an addendum potassium greater than 6 I tried to call the patient to get him to go to the emergency room for care there was no answer I then left a message on the patient's daughter's phone and I will follow back up today      Other Visit Diagnoses       Hypokalemia       Relevant Orders   BMP8+eGFR (Completed)         Meds ordered this encounter  Medications   cloNIDine  (CATAPRES ) tablet 0.1 mg   cloNIDine  (CATAPRES ) tablet 0.1 mg   carvedilol  (COREG ) 25 MG tablet    Sig: Take 1 tablet (25 mg total) by mouth 2 (two) times daily with a meal.    Dispense:  90 tablet    Refill:  1    Fill NOW  blood pressure too high >200/100  dc old dose   amLODipine  (NORVASC ) 5 MG tablet    Sig: Take 1 tablet (5 mg total) by mouth daily.    Dispense:  90 tablet    Refill:  1   Blood Pressure Monitoring (BLOOD PRESSURE KIT) DEVI    Sig: Use to measure blood pressure    Dispense:  1 each    Refill:  0    Bill medicaid   chlorthalidone  (HYGROTON ) 50 MG tablet    Sig: Take 1 tablet (50 mg total) by mouth daily.    Dispense:  90 tablet  Refill:  1   30 minutes spent extra time needed manage with blood pressure Arlene Lacy, MD

## 2023-07-20 ENCOUNTER — Ambulatory Visit: Admitting: Family Medicine

## 2023-07-20 ENCOUNTER — Encounter: Payer: Self-pay | Admitting: Critical Care Medicine

## 2023-07-20 ENCOUNTER — Ambulatory Visit: Attending: Critical Care Medicine | Admitting: Critical Care Medicine

## 2023-07-20 VITALS — BP 216/123 | HR 71 | Resp 19 | Ht 69.0 in | Wt 177.4 lb

## 2023-07-20 DIAGNOSIS — E854 Organ-limited amyloidosis: Secondary | ICD-10-CM

## 2023-07-20 DIAGNOSIS — E876 Hypokalemia: Secondary | ICD-10-CM

## 2023-07-20 DIAGNOSIS — E875 Hyperkalemia: Secondary | ICD-10-CM | POA: Diagnosis not present

## 2023-07-20 DIAGNOSIS — L97909 Non-pressure chronic ulcer of unspecified part of unspecified lower leg with unspecified severity: Secondary | ICD-10-CM

## 2023-07-20 DIAGNOSIS — I43 Cardiomyopathy in diseases classified elsewhere: Secondary | ICD-10-CM

## 2023-07-20 DIAGNOSIS — I10 Essential (primary) hypertension: Secondary | ICD-10-CM

## 2023-07-20 DIAGNOSIS — N1831 Chronic kidney disease, stage 3a: Secondary | ICD-10-CM | POA: Diagnosis not present

## 2023-07-20 DIAGNOSIS — N1832 Chronic kidney disease, stage 3b: Secondary | ICD-10-CM

## 2023-07-20 DIAGNOSIS — I70299 Other atherosclerosis of native arteries of extremities, unspecified extremity: Secondary | ICD-10-CM

## 2023-07-20 MED ORDER — CARVEDILOL 25 MG PO TABS: 25.0000 mg | ORAL_TABLET | Freq: Two times a day (BID) | ORAL | 1 refills | Status: DC

## 2023-07-20 MED ORDER — CLONIDINE HCL 0.1 MG PO TABS
0.1000 mg | ORAL_TABLET | Freq: Once | ORAL | Status: AC
  Administered 2023-07-20: 0.1 mg via ORAL

## 2023-07-20 MED ORDER — AMLODIPINE BESYLATE 5 MG PO TABS: 5.0000 mg | ORAL_TABLET | Freq: Every day | ORAL | 1 refills | Status: DC

## 2023-07-20 MED ORDER — BLOOD PRESSURE KIT DEVI: 0 refills | Status: AC

## 2023-07-20 MED ORDER — CHLORTHALIDONE 50 MG PO TABS: 50.0000 mg | ORAL_TABLET | Freq: Every day | ORAL | 1 refills | Status: DC

## 2023-07-20 NOTE — Assessment & Plan Note (Addendum)
 Poorly controlled 2 doses of 0.1 mg clonidine given with minimal effect  Plan to increase carvedilol to 25 mg twice daily begin amlodipine 5 mg daily continue Entresto twice daily as prescribed and BiDil and will add chlorthalidone 50 mg daily Patient advised to go to the emergency room if symptoms increase Patient needs repeat metabolic panel when he was in the emergency room potassium was 3.3.  He has not been taking any the furosemide.  He already has the apixaban and the atorvastatin and refills on the Entresto and BiDil.  He needs follow-up visit with cardiology. Patient was given a blood pressure meter by way of Medicaid at [facility] pharmacy he will also come back to see our clinical pharmacist in 1 week for blood pressure

## 2023-07-20 NOTE — Assessment & Plan Note (Signed)
 Management per cardiology.

## 2023-07-20 NOTE — Patient Instructions (Addendum)
 Labs today Start amlodipine one daily Start chlorthalidone one daily Get new dose of carvedilol and stop old dose: one 25mg  twice daily No other medication changes except STOP Gabapentin New medications sent to your [facility] Cardiology referral was made See [place] our clinical pharmacist in one week for blood pressure recheck Pick up blood pressure meter from [facility] on [address] they bill medicaid no charge to you, Measure blood pressure daily bring with you to [facility] appointment Return Primary care 6 weeks

## 2023-07-20 NOTE — Assessment & Plan Note (Signed)
 Resolved

## 2023-07-20 NOTE — Assessment & Plan Note (Signed)
 Reassess renal function

## 2023-07-21 ENCOUNTER — Encounter (HOSPITAL_COMMUNITY): Payer: Self-pay | Admitting: *Deleted

## 2023-07-21 ENCOUNTER — Emergency Department (HOSPITAL_COMMUNITY)
Admission: EM | Admit: 2023-07-21 | Discharge: 2023-07-21 | Disposition: A | Discharge status: Home / Self Care | Attending: Emergency Medicine | Admitting: Emergency Medicine

## 2023-07-21 ENCOUNTER — Other Ambulatory Visit: Payer: Self-pay

## 2023-07-21 ENCOUNTER — Telehealth: Payer: Self-pay | Admitting: Critical Care Medicine

## 2023-07-21 DIAGNOSIS — I10 Essential (primary) hypertension: Secondary | ICD-10-CM | POA: Diagnosis present

## 2023-07-21 DIAGNOSIS — E875 Hyperkalemia: Secondary | ICD-10-CM | POA: Insufficient documentation

## 2023-07-21 DIAGNOSIS — Z91040 Latex allergy status: Secondary | ICD-10-CM | POA: Diagnosis not present

## 2023-07-21 DIAGNOSIS — Z7901 Long term (current) use of anticoagulants: Secondary | ICD-10-CM | POA: Insufficient documentation

## 2023-07-21 DIAGNOSIS — Z7982 Long term (current) use of aspirin: Secondary | ICD-10-CM | POA: Insufficient documentation

## 2023-07-21 DIAGNOSIS — Z79899 Other long term (current) drug therapy: Secondary | ICD-10-CM | POA: Insufficient documentation

## 2023-07-21 LAB — CBC WITH DIFFERENTIAL/PLATELET
Abs Immature Granulocytes: 0.01 10*3/uL (ref 0.00–0.07)
Basophils Absolute: 0 10*3/uL (ref 0.0–0.1)
Basophils Relative: 1 %
Eosinophils Absolute: 0.2 10*3/uL (ref 0.0–0.5)
Eosinophils Relative: 5 %
HCT: 34.7 % — ABNORMAL LOW (ref 39.0–52.0)
Hemoglobin: 11 g/dL — ABNORMAL LOW (ref 13.0–17.0)
Immature Granulocytes: 0 %
Lymphocytes Relative: 35 %
Lymphs Abs: 1.1 10*3/uL (ref 0.7–4.0)
MCH: 24 pg — ABNORMAL LOW (ref 26.0–34.0)
MCHC: 31.7 g/dL (ref 30.0–36.0)
MCV: 75.6 fL — ABNORMAL LOW (ref 80.0–100.0)
Monocytes Absolute: 0.3 10*3/uL (ref 0.1–1.0)
Monocytes Relative: 10 %
Neutro Abs: 1.6 10*3/uL — ABNORMAL LOW (ref 1.7–7.7)
Neutrophils Relative %: 49 %
Platelets: 154 10*3/uL (ref 150–400)
RBC: 4.59 MIL/uL (ref 4.22–5.81)
RDW: 17.2 % — ABNORMAL HIGH (ref 11.5–15.5)
WBC: 3.3 10*3/uL — ABNORMAL LOW (ref 4.0–10.5)
nRBC: 0 % (ref 0.0–0.2)

## 2023-07-21 LAB — BASIC METABOLIC PANEL WITH GFR
Anion gap: 10 (ref 5–15)
BUN: 20 mg/dL (ref 8–23)
CO2: 19 mmol/L — ABNORMAL LOW (ref 22–32)
Calcium: 9.3 mg/dL (ref 8.9–10.3)
Chloride: 109 mmol/L (ref 98–111)
Creatinine, Ser: 1.34 mg/dL — ABNORMAL HIGH (ref 0.61–1.24)
GFR, Estimated: 60 mL/min — ABNORMAL LOW (ref >60)
Glucose, Bld: 92 mg/dL (ref 70–99)
Potassium: 3.8 mmol/L (ref 3.5–5.1)
Sodium: 138 mmol/L (ref 135–145)

## 2023-07-21 LAB — BMP8+EGFR
BUN/Creatinine Ratio: 14 (ref 10–24)
BUN: 20 mg/dL (ref 8–27)
CO2: 20 mmol/L (ref 20–29)
Calcium: 9.3 mg/dL (ref 8.6–10.2)
Chloride: 106 mmol/L (ref 96–106)
Creatinine, Ser: 1.42 mg/dL — ABNORMAL HIGH (ref 0.76–1.27)
Glucose: 103 mg/dL — ABNORMAL HIGH (ref 70–99)
Potassium: 6.5 mmol/L — ABNORMAL HIGH (ref 3.5–5.2)
Sodium: 138 mmol/L (ref 134–144)
eGFR: 56 mL/min/{1.73_m2} — ABNORMAL LOW (ref >59)

## 2023-07-21 MED ORDER — AMLODIPINE BESYLATE 5 MG PO TABS
5.0000 mg | ORAL_TABLET | Freq: Once | ORAL | Status: AC
  Administered 2023-07-21: 5 mg via ORAL
  Filled 2023-07-21: qty 1

## 2023-07-21 MED ORDER — SACUBITRIL-VALSARTAN 97-103 MG PO TABS
1.0000 | ORAL_TABLET | Freq: Two times a day (BID) | ORAL | Status: DC
  Administered 2023-07-21: 1 via ORAL
  Filled 2023-07-21: qty 1

## 2023-07-21 MED ORDER — ISOSORB DINITRATE-HYDRALAZINE 20-37.5 MG PO TABS
2.0000 | ORAL_TABLET | Freq: Once | ORAL | Status: AC
  Administered 2023-07-21: 2 via ORAL
  Filled 2023-07-21: qty 2

## 2023-07-21 MED ORDER — CARVEDILOL 12.5 MG PO TABS
25.0000 mg | ORAL_TABLET | Freq: Once | ORAL | Status: AC
  Administered 2023-07-21: 25 mg via ORAL
  Filled 2023-07-21: qty 2

## 2023-07-21 MED ORDER — CHLORTHALIDONE 25 MG PO TABS
50.0000 mg | ORAL_TABLET | Freq: Once | ORAL | Status: AC
  Administered 2023-07-21: 50 mg via ORAL
  Filled 2023-07-21: qty 2

## 2023-07-21 NOTE — Telephone Encounter (Signed)
 I was able to reach this patient by phone,  he understands EMS is coming to take him to the hospital for evaluation of hyperkalemia and HTN out of control  I spoke to 911 operator and they are dispatching EMS  I spoke to triage RN at [facility] ED and they are expecting this patient whom I feel needs emergency treatments and admission

## 2023-07-21 NOTE — ED Provider Triage Note (Signed)
 Emergency Medicine Provider Triage Evaluation Note  [name] , a 63 y.o. male  was evaluated in triage.  Pt complains of elevated potassium on blood draw yesterday.  Patient seen by Dr. Brent Cambric yesterday.  Blood draw revealed elevated potassium at 6.5.  Patient with longstanding history of hypertension.  He has been off his meds for approximately 2 to 3 months.  Visit yesterday with Dr. Brent Cambric was to reestablish care and to obtain medications.  Patient is currently asymptomatic.  He did not take any of his medications this morning.  Review of Systems  Positive: Elevated blood pressure, elevated potassium on blood draw from yesterday Negative: Headache, chest pain, shortness of breath, other complaint  Physical Exam  BP (!) 210/123   Pulse 71   Temp 98.9 F (37.2 C) (Oral)   Resp 17   SpO2 99%  Gen:   Awake, no distress   Resp:  Normal effort  MSK:   Moves extremities without difficulty      Medical Decision Making  Medically screening exam initiated at 9:55 AM.  Appropriate orders placed.  [name] was informed that the remainder of the evaluation will be completed by another provider, this initial triage assessment does not replace that evaluation, and the importance of remaining in the ED until their evaluation is complete.  Patient understands triage process.  He understands need to remain in the ED until completion of evaluation.   [name], MD 07/21/23 [phone]

## 2023-07-21 NOTE — Telephone Encounter (Signed)
 The patient's lab came back with very high potassium trying to reach him to get in with the emergency room his phone is not operating properly I have reached out to his daughter because he needs to go the emergency room to have this addressed immediately I will follow-up with him again later today

## 2023-07-21 NOTE — Discharge Instructions (Addendum)
 Return for any problem.  This morning you were given the morning doses of your Norvasc , Coreg , Hygroton , BiDil , and Entresto .  You will not need to take these morning doses when you get home today.  Your potassium today was normal (3.8).

## 2023-07-21 NOTE — Addendum Note (Signed)
 Addended by: Vernell Goldsmith on: 07/21/2023 06:35 AM   Modules accepted: Orders

## 2023-07-21 NOTE — ED Triage Notes (Signed)
 Patient is coming from home and he went to MD office. Yesterday BP 220, sent him home because he had not taken his BP med. MD called this am and told him his BP is high and his potassium is high. Pt is asymptomatic. Pt has not had his bp med today. BP 230/110, HR 76, CBG 112. Pt started 2 other meds yesterday but not sure which ones.

## 2023-07-21 NOTE — Progress Notes (Signed)
 Pt aware of labs and needs to go to ED  EMS was dispatched for safe transport

## 2023-07-21 NOTE — ED Provider Notes (Signed)
 [place] EMERGENCY DEPARTMENT AT [facility] Provider Note   CSN: [id number] Arrival date & time: 07/21/23  2725     History  Chief Complaint  Patient presents with   Hypertension    Johan Creveling is a 63 y.o. male.  63 year old male with prior medical history as detailed below presents for evaluation of elevated potassium.  He is without current complaint.    Patient seen by Dr. Brent Cambric yesterday.  Blood draw revealed elevated potassium at 6.5.  Patient with longstanding history of hypertension.  He has been off his meds for approximately 2 to 3 months.  Visit yesterday with Dr. Brent Cambric was to reestablish care and to obtain medications.  Patient is currently asymptomatic.  He did not take any of his medications this morning.  The history is provided by the patient.       Home Medications Prior to Admission medications   Medication Sig Start Date End Date Taking? Authorizing Provider  acetaminophen  (TYLENOL ) 500 MG tablet Take 500 mg by mouth 3 (three) times daily as needed for moderate pain.    [provider]  allopurinol  (ZYLOPRIM ) 100 MG tablet Take 1 tablet (100 mg total) by mouth daily. 07/13/23 01/09/24  Vernell Goldsmith, MD  amiodarone  (PACERONE ) 100 MG tablet Take 1 tablet (100 mg total) by mouth daily. 07/13/23   Vernell Goldsmith, MD  amLODipine  (NORVASC ) 5 MG tablet Take 1 tablet (5 mg total) by mouth daily. 07/20/23   Vernell Goldsmith, MD  apixaban  (ELIQUIS ) 5 MG TABS tablet Take 1 tablet (5 mg total) by mouth 2 (two) times daily. 07/13/23   Vernell Goldsmith, MD  aspirin  EC 81 MG tablet Take 81 mg by mouth daily. Swallow whole.    [provider]  atorvastatin  (LIPITOR) 20 MG tablet Take 1 tablet (20 mg total) by mouth daily. 07/13/23   Vernell Goldsmith, MD  Blood Pressure Monitoring (BLOOD PRESSURE KIT) DEVI Use to measure blood pressure 07/20/23   Vernell Goldsmith, MD  carvedilol  (COREG ) 25 MG tablet Take 1 tablet (25 mg total) by mouth 2  (two) times daily with a meal. 07/20/23   Vernell Goldsmith, MD  chlorthalidone  (HYGROTON ) 50 MG tablet Take 1 tablet (50 mg total) by mouth daily. 07/20/23   Vernell Goldsmith, MD  furosemide  (LASIX ) 40 MG tablet Take 1 tablet (40 mg total) by mouth as needed for fluid. 07/13/23   Vernell Goldsmith, MD  isosorbide -hydrALAZINE  (BIDIL ) 20-37.5 MG tablet Take 2 tablets by mouth 3 (three) times daily. 07/13/23   Vernell Goldsmith, MD  omeprazole  (PRILOSEC) 40 MG capsule Take 1 capsule (40 mg total) by mouth daily. 07/13/23   Vernell Goldsmith, MD  sacubitril -valsartan  (ENTRESTO ) 97-103 MG Take 1 tablet by mouth 2 (two) times daily. Any further refills should come from Dr [name] or Dr Julane Ny 07/13/23   Vernell Goldsmith, MD      Allergies    Adhesive [tape] and Latex    Review of Systems   Review of Systems  All other systems reviewed and are negative.   Physical Exam Updated Vital Signs BP (!) 210/123   Pulse 71   Temp 98.9 F (37.2 C) (Oral)   Resp 17   SpO2 99%  Physical Exam Vitals and nursing note reviewed.  Constitutional:      General: He is not in acute distress.    Appearance: Normal appearance. He is well-developed.  HENT:     Head: Normocephalic and  atraumatic.  Eyes:     Conjunctiva/sclera: Conjunctivae normal.     Pupils: Pupils are equal, round, and reactive to light.  Cardiovascular:     Rate and Rhythm: Normal rate and regular rhythm.     Heart sounds: Normal heart sounds.  Pulmonary:     Effort: Pulmonary effort is normal. No respiratory distress.     Breath sounds: Normal breath sounds.  Abdominal:     General: There is no distension.     Palpations: Abdomen is soft.     Tenderness: There is no abdominal tenderness.  Musculoskeletal:        General: No deformity. Normal range of motion.     Cervical back: Normal range of motion and neck supple.  Skin:    General: Skin is warm and dry.  Neurological:     General: No focal deficit present.     Mental  Status: He is alert and oriented to person, place, and time.     ED Results / Procedures / Treatments   Labs (all labs ordered are listed, but only abnormal results are displayed) Labs Reviewed  BASIC METABOLIC PANEL WITH GFR - Abnormal; Notable for the following components:      Result Value   CO2 19 (*)    Creatinine, Ser 1.34 (*)    GFR, Estimated 60 (*)    All other components within normal limits  CBC WITH DIFFERENTIAL/PLATELET - Abnormal; Notable for the following components:   WBC 3.3 (*)    Hemoglobin 11.0 (*)    HCT 34.7 (*)    MCV 75.6 (*)    MCH 24.0 (*)    RDW 17.2 (*)    Neutro Abs 1.6 (*)    All other components within normal limits    EKG EKG Interpretation Date/Time:  Friday July 21 2023 10:09:52 EDT Ventricular Rate:  64 PR Interval:  188 QRS Duration:  108 QT Interval:  466 QTC Calculation: 480 R Axis:   21  Text Interpretation: Normal sinus rhythm Possible Left atrial enlargement Left ventricular hypertrophy with repolarization abnormality ( Sokolow-Lyon , Cornell product ) Anteroseptal infarct , age undetermined Abnormal ECG When compared with ECG of 06-Jul-2023 08:00, PREVIOUS ECG IS PRESENT Confirmed by Angela Kell [phone]) on 07/21/2023 10:54:24 AM  Radiology No results found.  Procedures Procedures    Medications Ordered in ED Medications  amLODipine  (NORVASC ) tablet 5 mg (has no administration in time range)  carvedilol  (COREG ) tablet 25 mg (has no administration in time range)  chlorthalidone  (HYGROTON ) tablet 50 mg (has no administration in time range)  sacubitril -valsartan  (ENTRESTO ) 97-103 mg per tablet (has no administration in time range)  isosorbide -hydrALAZINE  (BIDIL ) 20-37.5 MG per tablet 2 tablet (has no administration in time range)    ED Course/ Medical Decision Making/ A&P                                 Medical Decision Making Amount and/or Complexity of Data Reviewed Labs: ordered.  Risk Prescription drug  management.    Medical Screen Complete  This patient presented to the ED with complaint of elevated potassium.  This complaint involves an extensive number of treatment options. The initial differential diagnosis includes, but is not limited to, spurious lab value  This presentation is: Acute, Chronic, Self-Limited, and Previously Undiagnosed  Patient with longstanding history of hypertension.  Patient was seen by Dr. Brent Cambric yesterday.  Labs drawn yesterday revealed likely spurious  hyperkalemia.  Repeat labs today demonstrate normal potassium.  Patient is hypertensive.  This appears to be chronic.  Patient did not take any of his morning meds.  He is without complaint related to his high blood pressure.  He is without evidence of endorgan damage secondary to his elevated blood pressure.  Morning BP medications administered.  Patient is comfortable.  He plans on going home and taking the rest of his medicines.  Importance of close follow-up stressed.  Strict return precautions given and understood.  Co morbidities that complicated the patient's evaluation  See HPI   Additional history obtained:  External records from outside sources obtained and reviewed including prior ED visits and prior Inpatient records.   Problem List / ED Course:  Hypertension, concern for elevated potassium   Reevaluation:  After the interventions noted above, I reevaluated the patient and found that they have: improved   Disposition:  After consideration of the diagnostic results and the patients response to treatment, I feel that the patent would benefit from close outpatient follow-up.          Final Clinical Impression(s) / ED Diagnoses Final diagnoses:  Hypertension, unspecified type    Rx / DC Orders ED Discharge Orders     None         [name], MD 07/21/23 1058

## 2023-07-21 NOTE — Assessment & Plan Note (Signed)
 Labs returned this morning this is an addendum potassium greater than 6 I tried to call the patient to get him to go to the emergency room for care there was no answer I then left a message on the patient's daughter's phone and I will follow back up today

## 2023-08-01 ENCOUNTER — Other Ambulatory Visit: Payer: Self-pay | Admitting: Cardiovascular Disease

## 2023-08-03 ENCOUNTER — Ambulatory Visit: Admitting: Family Medicine

## 2023-08-13 ENCOUNTER — Other Ambulatory Visit: Payer: Self-pay | Admitting: Cardiovascular Disease

## 2023-08-13 ENCOUNTER — Other Ambulatory Visit: Payer: Self-pay | Admitting: Critical Care Medicine
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
## 2023-08-14 ENCOUNTER — Ambulatory Visit: Attending: Cardiology | Admitting: Cardiology

## 2023-08-14 ENCOUNTER — Encounter: Payer: Self-pay | Admitting: Cardiology

## 2023-08-14 VITALS — BP 160/92 | HR 65 | Ht 69.0 in | Wt 182.0 lb

## 2023-08-14 DIAGNOSIS — G4733 Obstructive sleep apnea (adult) (pediatric): Secondary | ICD-10-CM

## 2023-08-14 DIAGNOSIS — I5032 Chronic diastolic (congestive) heart failure: Secondary | ICD-10-CM

## 2023-08-14 DIAGNOSIS — Z952 Presence of prosthetic heart valve: Secondary | ICD-10-CM

## 2023-08-14 DIAGNOSIS — I1A0 Resistant hypertension: Secondary | ICD-10-CM

## 2023-08-14 DIAGNOSIS — I4819 Other persistent atrial fibrillation: Secondary | ICD-10-CM

## 2023-08-14 DIAGNOSIS — E785 Hyperlipidemia, unspecified: Secondary | ICD-10-CM

## 2023-08-14 LAB — LIPID PANEL

## 2023-08-14 NOTE — Telephone Encounter (Signed)
 Dr. Lavern Potash pt. This RX was prescribed by Dr. Arlester Ladd but, in last fill, it says that Dr. [name] should refill. Please address.

## 2023-08-14 NOTE — Patient Instructions (Signed)
 Medication Instructions:  Continue all current medications *If you need a refill on your cardiac medications before your next appointment, please call your pharmacy*  Lab Work: Tsh, mg, cbc, lipid panel, cmet today If you have labs (blood work) drawn today and your tests are completely normal, you will receive your results only by: MyChart Message (if you have MyChart) OR A paper copy in the mail If you have any lab test that is abnormal or we need to change your treatment, we will call you to review the results.  Testing/Procedures: none  Follow-Up: At [facility], you and your health needs are our priority.  As part of our continuing mission to provide you with exceptional heart care, our providers are all part of one team.  This team includes your primary Cardiologist (physician) and Advanced Practice Providers or APPs (Physician Assistants and Nurse Practitioners) who all work together to provide you with the care you need, when you need it.  Your next appointment:   3 month(s)  Provider:   Wendie Hamburg, MD    We recommend signing up for the patient portal called "MyChart".  Sign up information is provided on this After Visit Summary.  MyChart is used to connect with patients for Virtual Visits (Telemedicine).  Patients are able to view lab/test results, encounter notes, upcoming appointments, etc.  Non-urgent messages can be sent to your provider as well.   To learn more about what you can do with MyChart, go to [email or web address].   Other Instructions Referral to [facility] Referral to Ep Please check blood pressure twice a day for one week and send those readings via mychart

## 2023-08-15 ENCOUNTER — Other Ambulatory Visit: Payer: Self-pay

## 2023-08-15 ENCOUNTER — Encounter: Payer: Self-pay | Admitting: Pharmacist

## 2023-08-15 ENCOUNTER — Ambulatory Visit: Attending: Family Medicine | Admitting: Pharmacist

## 2023-08-15 VITALS — BP 158/82 | HR 64

## 2023-08-15 DIAGNOSIS — I10 Essential (primary) hypertension: Secondary | ICD-10-CM | POA: Diagnosis not present

## 2023-08-15 DIAGNOSIS — I5033 Acute on chronic diastolic (congestive) heart failure: Secondary | ICD-10-CM | POA: Diagnosis not present

## 2023-08-15 LAB — COMPREHENSIVE METABOLIC PANEL WITH GFR
ALT: 17 IU/L (ref 0–44)
AST: 22 IU/L (ref 0–40)
Albumin: 4 g/dL (ref 3.9–4.9)
Alkaline Phosphatase: 91 IU/L (ref 44–121)
BUN/Creatinine Ratio: 21 (ref 10–24)
BUN: 36 mg/dL — ABNORMAL HIGH (ref 8–27)
Bilirubin Total: 0.3 mg/dL (ref 0.0–1.2)
CO2: 20 mmol/L (ref 20–29)
Calcium: 9.9 mg/dL (ref 8.6–10.2)
Chloride: 105 mmol/L (ref 96–106)
Creatinine, Ser: 1.72 mg/dL — ABNORMAL HIGH (ref 0.76–1.27)
Globulin, Total: 3.6 g/dL (ref 1.5–4.5)
Glucose: 95 mg/dL (ref 70–99)
Potassium: 4 mmol/L (ref 3.5–5.2)
Sodium: 142 mmol/L (ref 134–144)
Total Protein: 7.6 g/dL (ref 6.0–8.5)
eGFR: 44 mL/min/{1.73_m2} — ABNORMAL LOW (ref >59)

## 2023-08-15 LAB — LIPID PANEL
Cholesterol, Total: 174 mg/dL (ref 100–199)
HDL: 33 mg/dL — ABNORMAL LOW (ref >39)
LDL CALC COMMENT:: 5.3 ratio — ABNORMAL HIGH (ref 0.0–5.0)
LDL Chol Calc (NIH): 114 mg/dL — ABNORMAL HIGH (ref 0–99)
Triglycerides: 149 mg/dL (ref 0–149)
VLDL Cholesterol Cal: 27 mg/dL (ref 5–40)

## 2023-08-15 LAB — CBC WITH DIFFERENTIAL/PLATELET
Basophils Absolute: 0 10*3/uL (ref 0.0–0.2)
Basos: 1 %
EOS (ABSOLUTE): 0.3 10*3/uL (ref 0.0–0.4)
Eos: 7 %
Hematocrit: 41.6 % (ref 37.5–51.0)
Hemoglobin: 12.1 g/dL — ABNORMAL LOW (ref 13.0–17.7)
Immature Grans (Abs): 0 10*3/uL (ref 0.0–0.1)
Immature Granulocytes: 0 %
Lymphocytes Absolute: 1.2 10*3/uL (ref 0.7–3.1)
Lymphs: 31 %
MCH: 22.7 pg — ABNORMAL LOW (ref 26.6–33.0)
MCHC: 29.1 g/dL — ABNORMAL LOW (ref 31.5–35.7)
MCV: 78 fL — ABNORMAL LOW (ref 79–97)
Monocytes Absolute: 0.5 10*3/uL (ref 0.1–0.9)
Monocytes: 13 %
Neutrophils Absolute: 1.9 10*3/uL (ref 1.4–7.0)
Neutrophils: 48 %
Platelets: 157 10*3/uL (ref 150–450)
RBC: 5.33 x10E6/uL (ref 4.14–5.80)
RDW: 15.8 % — ABNORMAL HIGH (ref 11.6–15.4)
WBC: 3.8 10*3/uL (ref 3.4–10.8)

## 2023-08-15 LAB — MAGNESIUM: Magnesium: 1.7 mg/dL (ref 1.6–2.3)

## 2023-08-15 LAB — TSH: TSH: 1.7 u[IU]/mL (ref 0.450–4.500)

## 2023-08-15 MED ORDER — CARVEDILOL 25 MG PO TABS
25.0000 mg | ORAL_TABLET | Freq: Two times a day (BID) | ORAL | 1 refills | Status: DC
  Filled 2023-08-15 – 2023-09-15 (×3): qty 90, 45d supply, fill #0
  Filled 2023-10-23: qty 90, 45d supply, fill #1

## 2023-08-15 MED ORDER — FUROSEMIDE 40 MG PO TABS
40.0000 mg | ORAL_TABLET | ORAL | 3 refills | Status: AC | PRN
  Filled 2023-08-15 – 2023-10-09 (×4): qty 30, 30d supply, fill #0

## 2023-08-15 MED ORDER — AMLODIPINE BESYLATE 5 MG PO TABS
5.0000 mg | ORAL_TABLET | Freq: Every day | ORAL | 1 refills | Status: DC
  Filled 2023-08-15 – 2023-09-27 (×4): qty 90, 90d supply, fill #0
  Filled 2023-12-19: qty 90, 90d supply, fill #1
  Filled ????-??-??: fill #0

## 2023-08-15 MED ORDER — CHLORTHALIDONE 50 MG PO TABS
50.0000 mg | ORAL_TABLET | Freq: Every day | ORAL | 1 refills | Status: DC
  Filled 2023-08-15 – 2023-09-27 (×4): qty 90, 90d supply, fill #0
  Filled 2023-12-19: qty 90, 90d supply, fill #1

## 2023-08-15 MED ORDER — ENTRESTO 97-103 MG PO TABS
1.0000 | ORAL_TABLET | Freq: Two times a day (BID) | ORAL | 2 refills | Status: DC
  Filled 2023-08-15 – 2023-09-15 (×2): qty 60, 30d supply, fill #0
  Filled 2023-09-15: qty 60, 30d supply, fill #1
  Filled 2023-10-09: qty 60, 30d supply, fill #0

## 2023-08-15 MED ORDER — ISOSORB DINITRATE-HYDRALAZINE 20-37.5 MG PO TABS
2.0000 | ORAL_TABLET | Freq: Three times a day (TID) | ORAL | 1 refills | Status: DC
  Filled 2023-08-15: qty 540, 90d supply, fill #0
  Filled 2023-08-16: qty 90, 15d supply, fill #0
  Filled 2023-08-16: qty 450, 75d supply, fill #0
  Filled 2023-09-15 – 2023-10-09 (×3): qty 180, 30d supply, fill #0
  Filled 2023-11-07: qty 180, 30d supply, fill #1
  Filled 2023-11-24 – 2023-12-07 (×2): qty 180, 30d supply, fill #2
  Filled 2024-01-01: qty 180, 30d supply, fill #3
  Filled 2024-01-29: qty 180, 30d supply, fill #4

## 2023-08-15 NOTE — Telephone Encounter (Signed)
 Requested medication (s) are due for refill today:   Not sure  Requested medication (s) are on the active medication list:   No   Discontinued 07/21/2023  Future visit scheduled:   Yes on 6/17 with Dr. Lincoln Renshaw    LOV 07/20/2023 with Dr. Brent Cambric   Last ordered: Discontinued 07/21/2023.  Not sure if he is to be on this or not.   See note from Cardiologist Dr. Carson Clara from [facility]. [place] dated 08/15/2023 at 6:34 AM.        Requested Prescriptions  Pending Prescriptions Disp Refills   potassium chloride  SA (KLOR-CON  M) 20 MEQ tablet [Pharmacy Med Name: POTASSIUM CL ER TABLETS] 90 tablet     Sig: TAKE 1 TABLET BY MOUTH DAILY WHEN FUROSEMIDE  AS NEEDED     Endocrinology:  Minerals - Potassium Supplementation Failed - 08/15/2023 12:54 PM      Failed - Cr in normal range and within 360 days    Creat  Date Value Ref Range Status  02/16/2022 1.75 (H) 0.70 - 1.35 mg/dL Final   Creatinine, Ser  Date Value Ref Range Status  08/14/2023 1.72 (H) 0.76 - 1.27 mg/dL Final         Passed - K in normal range and within 360 days    Potassium  Date Value Ref Range Status  08/14/2023 4.0 3.5 - 5.2 mmol/L Final         Passed - Valid encounter within last 12 months    Recent Outpatient Visits           Today Primary hypertension   [facility] Of [place]. [facility] Valente Gaskin, RPH-CPP   3 weeks ago Hyperkalemia   [facility] - A Dept Of [place]. [facility] Vernell Goldsmith, MD   1 month ago Chronic heart failure with preserved ejection fraction [facility])   [facility] - A Dept Of [place]. [facility] Vernell Goldsmith, MD   1 month ago Persistent atrial fibrillation [facility])   [facility] - A Dept Of [place]. [facility] Vernell Goldsmith, MD   11 months ago Mixed hyperlipidemia   [facility] - A Dept  Of [place]. [facility], Stan Eans, [place]       Future Appointments             In 2 months [name], Trula Gable, MD [facility] at [facility] A Dept of [place]. [facility] [employer], [employer]

## 2023-08-15 NOTE — Progress Notes (Signed)
 S:     No chief complaint on file.  63 y.o. male who presents for hypertension evaluation, education, and management.   Patient was referred and last seen by Primary Care Provider, Dr. Brent Cambric, on 07/20/2023.  At that visit, BP was 216/123 mmHg after the patient had been without medication. Of note, amlodipine  and chlorthalidone  added at that time. Carvedilol  dose was increased.    PMH is significant for HTN, CHF (w/ hx of acute CHF exacerbation, last EF 60-65%), a fib, mesenteric artery stenosis, atherosclerosis/CAD, TAVR, cardiac amyloidosis, OSA, GERD, Gout, CKD IIIa, HLD, obesity, chronic back pain, hx of CVA.   Today, patient arrives in good spirits and presents without assistance. Denies dizziness, headache, blurred vision, swelling. Of note, he saw his Cardiologist yesterday and BP showed improvement at 160/92 mmHg. CMP showed a jump in Scr compared to 07/21/2023 (1.72, up from 1.34). K nl at 4.0. No changes were made to medication until he could return for repeat labwork in 1 week.   Family/Social history:  Fhx: kidney failure, MI Tobacco: never smoker  Alcohol: none reported   Medication adherence reported with amlodipine , carvedilol , and chlorthalidone . Per his pharmacy, he filled 90 days of these on 4/17, 4/17, and 4/18 respectively. However, he is out of BiDil  and Entreso. Last filled these on 07/07/2023 and 07/05/2023 for 30 days.  Patient has taken BP medications today.  Current antihypertensives include: amlodipine  5mg  daily, Bidil  40-75 TID, carvedilol  25mg  BID, chlorthalidone  50 mg daily, Entresto  97-103 mg BID  Reported home BP readings: none reported   Patient reported dietary habits:  -Compliant with sodium restriction -Denies drinking excessive amounts of caffeine   Patient-reported exercise habits: walks daily   O:  Vitals:   08/15/23 0930  BP: (!) 158/82  Pulse: 64    Last 3 Office BP readings: BP Readings from Last 3 Encounters:  08/14/23 (!) 160/92   07/21/23 (!) 210/123  07/20/23 (!) 216/123    BMET    Component Value Date/Time   NA 142 08/14/2023 0901   K 4.0 08/14/2023 0901   CL 105 08/14/2023 0901   CO2 20 08/14/2023 0901   GLUCOSE 95 08/14/2023 0901   GLUCOSE 92 07/21/2023 1004   BUN 36 (H) 08/14/2023 0901   CREATININE 1.72 (H) 08/14/2023 0901   CREATININE 1.75 (H) 02/16/2022 1122   CALCIUM  9.9 08/14/2023 0901   GFRNONAA 60 (L) 07/21/2023 1004   GFRAA 69 04/29/2020 1453    Renal function: Estimated Creatinine Clearance: 44 mL/min (A) (by C-G formula based on SCr of 1.72 mg/dL (H)).  Clinical ASCVD: No  The ASCVD Risk score (Arnett DK, et al., 2019) failed to calculate for the following reasons:   Risk score cannot be calculated because patient has a medical history suggesting prior/existing ASCVD  Patient is participating in a Managed Medicaid Plan: No    A/P: Hypertension diagnosed currently above goal secondary to medication non-adherence.. BP goal < 130/80 mmHg. I have collaborated with our pharmacy. Will fill Entresto  and Bidil  today and have him restart these. I have entered future labs for him to complete next week and have cc'd these results with his Cardiologist.  -Continued current regimen.  -Refills given for Entresto  and Bidil .  -F/u labs ordered - CMP14+eGFR in 1 week. Pt counseled on returning here for labs.  -Counseled on lifestyle modifications for blood pressure control including reduced dietary sodium, increased exercise, adequate sleep. -Encouraged patient to check BP at home and bring log of readings to next visit.  Counseled on proper use of home BP cuff.   Results reviewed and written information provided.    Written patient instructions provided. Patient verbalized understanding of treatment plan.  Total time in face to face counseling 30 minutes.    Follow-up:  Pharmacist in 1 month.  Marene Shape, PharmD, Becky Bowels, CPP Clinical Pharmacist [facility] [phone]

## 2023-08-16 ENCOUNTER — Other Ambulatory Visit: Payer: Self-pay

## 2023-08-24 ENCOUNTER — Encounter (HOSPITAL_BASED_OUTPATIENT_CLINIC_OR_DEPARTMENT_OTHER): Payer: Self-pay

## 2023-08-24 ENCOUNTER — Telehealth: Payer: Self-pay

## 2023-08-24 ENCOUNTER — Ambulatory Visit: Payer: Self-pay

## 2023-08-24 ENCOUNTER — Other Ambulatory Visit: Payer: Self-pay

## 2023-08-24 DIAGNOSIS — N1831 Chronic kidney disease, stage 3a: Secondary | ICD-10-CM

## 2023-08-24 DIAGNOSIS — I10 Essential (primary) hypertension: Secondary | ICD-10-CM

## 2023-08-24 NOTE — Telephone Encounter (Signed)
 Spoke to patient our social worker advised to call your insurance to see if they will cover a B/P monitor.If they don't call back and our social worker can help.

## 2023-08-29 ENCOUNTER — Other Ambulatory Visit: Payer: Self-pay

## 2023-08-31 NOTE — Progress Notes (Unsigned)
 Electrophysiology Office Note:    Date:  09/01/2023   ID:  [name], DOB [birth year], MRN [id number]  [facility] Cardiologist:  Wendie Hamburg, MD  [facility] Electrophysiologist:  Boyce Byes, MD   Referring MD: Wendie Hamburg*   Chief Complaint: Atrial fibrillation  History of Present Illness:    [name] is a 63 year old man who I am seeing today for an evaluation of atrial fibrillation at the request of Dr. [name].  The patient has a history of severe aortic stenosis post TAVR, chronic systolic heart failure with recovered ejection fraction, atrial fibrillation, CKD.  He also has coronary artery disease.  The patient is on amiodarone  100 mg by mouth once daily in addition to Eliquis  5 mg by mouth twice daily.  Given the patient's young age and use of amiodarone , he is being referred to discuss catheter ablation.  He is doing well today.  Reports preserved energy levels.  Takes his amiodarone  daily.  Confirms he takes Eliquis  twice daily without missed doses.  He is interested in avoiding long-term use of amiodarone  given the associated risks.    Their past medical, social and family history was reviewed.   ROS:   Please see the history of present illness.    All other systems reviewed and are negative.  EKGs/Labs/Other Studies Reviewed:    The following studies were reviewed today:  July 05, 2023 echo EF 60-65 RV normal Dilated left and right atrium Mild MR TAVR with mild paravalvular leak at 2 o'clock position  July 21, 2023 EKG shows sinus rhythm.  LVH.  June 10, 2022 EKG shows coarse atrial fibrillation.  LVH.  June 05, 2022 EKG shows atrial fibrillation/flutter with rapid ventricular rate   EKG Interpretation Date/Time:  Friday Sep 01 2023 07:54:39 EDT Ventricular Rate:  72 PR Interval:  190 QRS Duration:  116 QT Interval:  444 QTC Calculation: 486 R Axis:   94  Text Interpretation: Sinus rhythm with Premature atrial  complexes Rightward axis Confirmed by [name] [phone]) on 09/01/2023 7:59:02 AM    Physical Exam:    VS:  BP (!) 130/90 (BP Location: Left Arm, Patient Position: Sitting, Cuff Size: Large)   Pulse 72   Ht 5\' 9"  (1.753 m)   Wt 183 lb (83 kg)   SpO2 98%   BMI 27.02 kg/m     Wt Readings from Last 3 Encounters:  09/01/23 183 lb (83 kg)  08/14/23 182 lb (82.6 kg)  07/20/23 177 lb 6.4 oz (80.5 kg)     GEN: no distress CARD: RRR, No MRG RESP: No IWOB. CTAB.        ASSESSMENT AND PLAN:    1. Primary hypertension   2. Persistent atrial fibrillation (HCC)   3. Encounter for long-term (current) use of high-risk medication     #Persistent atrial fibrillation #High risk med monitoring-amiodarone  Maintaining sinus rhythm on amiodarone .  Given his young age, this is not an ideal long-term solution.  His abnormal kidney function makes his sotalol and Tikosyn not ideal agents.  He has coronary artery disease and structural heart disease which eliminates the other antiarrhythmic options.  I have discussed catheter ablation as a mechanism to achieve rhythm control and avoid long-term exposure to amiodarone .  I discussed the procedural details including the risks, recovery and likelihood of success and he wishes to proceed..  Discussed treatment options today for AF including antiarrhythmic drug therapy and ablation. Discussed risks, recovery and likelihood of success with each treatment  strategy. Risk, benefits, and alternatives to EP study and ablation for afib were discussed. These risks include but are not limited to stroke, bleeding, vascular damage, tamponade, perforation, damage to the esophagus, lungs, phrenic nerve and other structures, pulmonary vein stenosis, worsening renal function, coronary vasospasm and death.  Discussed potential need for repeat ablation procedures and antiarrhythmic drugs after an initial ablation. The patient understands these risk and wishes to proceed.   We will therefore proceed with catheter ablation at the next available time.  Carto, ICE, anesthesia are requested for the procedure.  Will also obtain CT PV protocol prior to the procedure to exclude LAA thrombus and further evaluate atrial anatomy.  Continue Eliquis  for stroke prophylaxis  Blood work in May 2025 shows stable thyroid  and liver function.  #Hypertension At goal today.  Recommend checking blood pressures 1-2 times per week at home and recording the values.  Recommend bringing these recordings to the primary care physician. Continue Entresto , BiDil , chlorthalidone , Coreg , amlodipine .  Follow-up with general cardiology.   Signed, [name]. [name], MD, [facility], [facility] 09/01/2023 7:59 AM    Electrophysiology [facility]

## 2023-09-01 ENCOUNTER — Encounter: Payer: Self-pay | Admitting: Cardiology

## 2023-09-01 ENCOUNTER — Ambulatory Visit: Attending: Cardiology | Admitting: Cardiology

## 2023-09-01 ENCOUNTER — Other Ambulatory Visit: Payer: Self-pay

## 2023-09-01 VITALS — BP 130/90 | HR 72 | Ht 69.0 in | Wt 183.0 lb

## 2023-09-01 DIAGNOSIS — Z79899 Other long term (current) drug therapy: Secondary | ICD-10-CM | POA: Diagnosis not present

## 2023-09-01 DIAGNOSIS — I10 Essential (primary) hypertension: Secondary | ICD-10-CM

## 2023-09-01 DIAGNOSIS — I4819 Other persistent atrial fibrillation: Secondary | ICD-10-CM

## 2023-09-01 NOTE — Patient Instructions (Addendum)
 Medication Instructions:  Your physician recommends that you continue on your current medications as directed. Please refer to the Current Medication list given to you today.  *If you need a refill on your cardiac medications before your next appointment, please call your pharmacy*  Lab Work: BMET and CBC - you may go to any [facility] location to have these drawn within 30 days of your procedure  Testing/Procedures: Cardiac CT Your physician has requested that you have cardiac CT. Cardiac computed tomography (CT) is a painless test that uses an x-Blanchet machine to take clear, detailed pictures of your heart. For further information please visit [email or web address]  We will call you to schedule your CT scan. It will be done about three weeks prior to your ablation.  Ablation Your physician has recommended that you have an ablation. Catheter ablation is a medical procedure used to treat some cardiac arrhythmias (irregular heartbeats). During catheter ablation, a long, thin, flexible tube is put into a blood vessel in your groin (upper thigh), or neck. This tube is called an ablation catheter. It is then guided to your heart through the blood vessel. Radio frequency waves destroy small areas of heart tissue where abnormal heartbeats may cause an arrhythmia to start.  You are scheduled for Atrial Fibrillation Ablation on Friday, August 15 with Dr. [name].Please arrive at the Main Entrance A at [facility]: [address] [place], [place] [address] at 5:30 AM    Follow-Up: At [facility], you and your health needs are our priority.  As part of our continuing mission to provide you with exceptional heart care, we have created designated Provider Care Teams.  These Care Teams include your primary Cardiologist (physician) and Advanced Practice Providers (APPs -  Physician Assistants and Nurse Practitioners) who all work together to provide you with the care you need, when you need  it.   Your next appointment:   We will contact you about your post-procedure follow up appointments.     ;l

## 2023-09-04 ENCOUNTER — Other Ambulatory Visit: Payer: Self-pay | Admitting: Critical Care Medicine

## 2023-09-14 NOTE — Progress Notes (Incomplete)
 S:     No chief complaint on file.  63 y.o. male who presents for hypertension evaluation, education, and management.   Patient was referred and last seen by Primary Care Provider, Dr. Brent Cambric, on 07/20/2023.  At that visit, BP was 216/123 mmHg after the patient had been without medication. Of note, amlodipine  and chlorthalidone  added at that time. Carvedilol  dose was increased. He was last seen by pharmacy on 08/15/23, and BP was 158/82 mmHg but no medication changes were made. He saw electrophysiology on 09/01/23, BP was 130/90 mmHg and he was scheduled for an ablation for his atrial fibrillation.  PMH is significant for HTN, CHF (w/ hx of acute CHF exacerbation, last EF 60-65%), a fib, mesenteric artery stenosis, atherosclerosis/CAD, TAVR, cardiac amyloidosis, OSA, GERD, Gout, CKD IIIa, HLD, obesity, chronic back pain, hx of CVA.   Today, patient arrives in good spirits and presents without assistance. Denies dizziness, headache, blurred vision, swelling. *** CMP on 08/14/23 showed a jump in Scr compared to 07/21/2023 (1.72, up from 1.34). K nl at 4.0. No repeat lab work was completed after this finding.   Family/Social history:  Fhx: kidney failure, MI Tobacco: never smoker  Alcohol: none reported   Medication adherence reported with amlodipine , carvedilol , and chlorthalidone . Per his pharmacy, he filled 90 days of these on 4/17, 4/17, and 4/18 respectively. However, he is out of BiDil  and Entreso. Last filled these on 07/07/2023 and 07/05/2023 for 30 days. ***  Patient has taken BP medications today.  Current antihypertensives include: amlodipine  5mg  daily, Bidil  40-75 TID, carvedilol  25mg  BID, chlorthalidone  50 mg daily, Entresto  97-103 mg BID  Reported home BP readings: none reported   Patient reported dietary habits:  -Compliant with sodium restriction -Denies drinking excessive amounts of caffeine   Patient-reported exercise habits: walks daily   O:  There were no vitals filed for  this visit.   Last 3 Office BP readings: BP Readings from Last 3 Encounters:  09/01/23 (!) 130/90  08/15/23 (!) 158/82  08/14/23 (!) 160/92    BMET    Component Value Date/Time   NA 142 08/14/2023 0901   K 4.0 08/14/2023 0901   CL 105 08/14/2023 0901   CO2 20 08/14/2023 0901   GLUCOSE 95 08/14/2023 0901   GLUCOSE 92 07/21/2023 1004   BUN 36 (H) 08/14/2023 0901   CREATININE 1.72 (H) 08/14/2023 0901   CREATININE 1.75 (H) 02/16/2022 1122   CALCIUM  9.9 08/14/2023 0901   GFRNONAA 60 (L) 07/21/2023 1004   GFRAA 69 04/29/2020 1453    Renal function: CrCl cannot be calculated (Patient's most recent lab result is older than the maximum 21 days allowed.).  Clinical ASCVD: No  The ASCVD Risk score (Arnett DK, et al., 2019) failed to calculate for the following reasons:   Risk score cannot be calculated because patient has a medical history suggesting prior/existing ASCVD  Patient is participating in a Managed Medicaid Plan: No    A/P: Hypertension diagnosed currently above *** goal secondary to medication non-adherence.. BP goal < 130/80 mmHg. Despite entering future labs for him 1 week after last visit, he did not return to complete those labs. Will have to recheck labs today to ensure renal function has returned to baseline.  -Continued current regimen. *** -F/u labs ordered - CMP14+eGFR today -Counseled on lifestyle modifications for blood pressure control including reduced dietary sodium, increased exercise, adequate sleep. -Encouraged patient to check BP at home and bring log of readings to next visit. Counseled on proper  use of home BP cuff.   Results reviewed and written information provided.    Written patient instructions provided. Patient verbalized understanding of treatment plan.  Total time in face to face counseling 30 minutes.    Follow-up:  Pharmacist in 1 month.***  Juleen Oakland, PharmD PGY1 Pharmacy Resident

## 2023-09-15 ENCOUNTER — Ambulatory Visit: Payer: Self-pay | Attending: Family Medicine | Admitting: Pharmacist

## 2023-09-15 ENCOUNTER — Other Ambulatory Visit (HOSPITAL_COMMUNITY): Payer: Self-pay

## 2023-09-15 ENCOUNTER — Other Ambulatory Visit: Payer: Self-pay

## 2023-09-15 ENCOUNTER — Encounter: Payer: Self-pay | Admitting: Pharmacist

## 2023-09-15 DIAGNOSIS — I5033 Acute on chronic diastolic (congestive) heart failure: Secondary | ICD-10-CM | POA: Diagnosis not present

## 2023-09-15 DIAGNOSIS — I350 Nonrheumatic aortic (valve) stenosis: Secondary | ICD-10-CM

## 2023-09-15 DIAGNOSIS — E782 Mixed hyperlipidemia: Secondary | ICD-10-CM | POA: Diagnosis not present

## 2023-09-15 MED ORDER — ATORVASTATIN CALCIUM 20 MG PO TABS
20.0000 mg | ORAL_TABLET | Freq: Every day | ORAL | 2 refills | Status: AC
  Filled 2023-09-15 – 2023-09-19 (×4): qty 90, 90d supply, fill #0
  Filled 2023-12-11: qty 90, 90d supply, fill #1
  Filled 2024-03-10: qty 90, 90d supply, fill #2

## 2023-09-15 MED ORDER — APIXABAN 5 MG PO TABS
5.0000 mg | ORAL_TABLET | Freq: Two times a day (BID) | ORAL | 3 refills | Status: AC
  Filled 2023-09-15 – 2023-09-19 (×4): qty 180, 90d supply, fill #0
  Filled 2023-12-11: qty 180, 90d supply, fill #1
  Filled 2024-03-10: qty 180, 90d supply, fill #2

## 2023-09-15 NOTE — Progress Notes (Signed)
 S:     No chief complaint on file.  63 y.o. male who presents for hypertension evaluation, education, and management.   Patient was referred and last seen by Primary Care Provider, Dr. Brent Cambric, on 07/20/2023.  At that visit, BP was 216/123 mmHg after the patient had been without medication. Of note, amlodipine  and chlorthalidone  added at that time. Carvedilol  dose was increased.   Of note, BMP on 07/20/23 showed K of 6.5. However, repeat level in the ED 07/21/2023 showed a normal K. Patient then saw his Cardiologist subsequently on 08/14/23. While K was normal, his creatinine showed an increase from 1.34 prior to 1.72. Dr. [name] recommended pt return for repeat K and Mg labs, however, the patient never got these done.   He was last seen by pharmacy on 08/15/23, and BP was 158/82 mmHg but no medication changes were made. He saw electrophysiology on 09/01/23, BP was 130/90 mmHg and he was scheduled for an ablation for his atrial fibrillation.  PMH is significant for HTN, CHF (w/ hx of acute CHF exacerbation, last EF 60-65%), a fib, mesenteric artery stenosis, atherosclerosis/CAD, TAVR, cardiac amyloidosis, OSA, GERD, Gout, CKD IIIa, HLD, obesity, chronic back pain, hx of CVA.   Today, patient arrives in good spirits and presents without assistance. Denies dizziness, headache, blurred vision, swelling.    Family/Social history:  Fhx: kidney failure, MI Tobacco: never smoker  Alcohol: none reported   Medication adherence reported with medications, however, he admits that he is out of Entresto , carvedilol , and BiDil . He did not take these this morning but did take amlodipine  and chlorthalidone .   Current antihypertensives include: amlodipine  5mg  daily, Bidil  40-75 TID, carvedilol  25mg  BID, chlorthalidone  50 mg daily, Entresto  97-103 mg BID  Reported home BP readings: none reported   Patient reported dietary habits:  -Compliant with sodium restriction -Denies drinking excessive amounts of  caffeine   Patient-reported exercise habits: walks daily   O:  Vitals:   09/15/23 1138  BP: (!) 164/95  Pulse: 65    Last 3 Office BP readings: BP Readings from Last 3 Encounters:  09/15/23 (!) 164/95  09/01/23 (!) 130/90  08/15/23 (!) 158/82    BMET    Component Value Date/Time   NA 142 08/14/2023 0901   K 4.0 08/14/2023 0901   CL 105 08/14/2023 0901   CO2 20 08/14/2023 0901   GLUCOSE 95 08/14/2023 0901   GLUCOSE 92 07/21/2023 1004   BUN 36 (H) 08/14/2023 0901   CREATININE 1.72 (H) 08/14/2023 0901   CREATININE 1.75 (H) 02/16/2022 1122   CALCIUM  9.9 08/14/2023 0901   GFRNONAA 60 (L) 07/21/2023 1004   GFRAA 69 04/29/2020 1453    Renal function: CrCl cannot be calculated (Patient's most recent lab result is older than the maximum 21 days allowed.).  Clinical ASCVD: No  The ASCVD Risk score (Arnett DK, et al., 2019) failed to calculate for the following reasons:   Risk score cannot be calculated because patient has a medical history suggesting prior/existing ASCVD  Patient is participating in a Managed Medicaid Plan: No    A/P: Hypertension diagnosed currently above goal secondary to medication non-adherence. BP goal < 130/80 mmHg. Despite entering future labs for him 1 week after last visit, he did not return to complete those labs. Will have to recheck labs today to ensure renal function and electrolyte status is stable. Additionally, I have communicated with our pharmacy downstairs. Pt is in agreement to set up fills  with our pharmacy. We are  going to set him for auto-fill and delivery to help his adherence. I do not want to make changes at this time as his BP today is not accurate given that he hasn't taken several of his antihypertensives this morning.  -Continued current regimen.  -Coordinated fills and delivery with our pharmacy. -F/u labs ordered - CMP14+eGFR today, Mg -Counseled on lifestyle modifications for blood pressure control including reduced dietary  sodium, increased exercise, adequate sleep. -Encouraged patient to check BP at home and bring log of readings to next visit. Counseled on proper use of home BP cuff.   Results reviewed and written information provided.    Written patient instructions provided. Patient verbalized understanding of treatment plan.  Total time in face to face counseling 30 minutes.    Follow-up:  Pharmacist in 1 month.  Marene Shape, PharmD, Becky Bowels, CPP Clinical Pharmacist [facility] [phone]

## 2023-09-16 LAB — CMP14+EGFR
ALT: 13 IU/L (ref 0–44)
AST: 21 IU/L (ref 0–40)
Albumin: 4 g/dL (ref 3.9–4.9)
Alkaline Phosphatase: 82 IU/L (ref 44–121)
BUN/Creatinine Ratio: 18 (ref 10–24)
BUN: 32 mg/dL — ABNORMAL HIGH (ref 8–27)
Bilirubin Total: 0.3 mg/dL (ref 0.0–1.2)
CO2: 21 mmol/L (ref 20–29)
Calcium: 10.4 mg/dL — ABNORMAL HIGH (ref 8.6–10.2)
Chloride: 104 mmol/L (ref 96–106)
Creatinine, Ser: 1.74 mg/dL — ABNORMAL HIGH (ref 0.76–1.27)
Globulin, Total: 3.7 g/dL (ref 1.5–4.5)
Glucose: 113 mg/dL — ABNORMAL HIGH (ref 70–99)
Potassium: 4 mmol/L (ref 3.5–5.2)
Sodium: 141 mmol/L (ref 134–144)
Total Protein: 7.7 g/dL (ref 6.0–8.5)
eGFR: 44 mL/min/{1.73_m2} — ABNORMAL LOW (ref >59)

## 2023-09-16 LAB — MAGNESIUM: Magnesium: 1.6 mg/dL (ref 1.6–2.3)

## 2023-09-18 ENCOUNTER — Ambulatory Visit: Payer: Self-pay | Admitting: Family Medicine

## 2023-09-18 DIAGNOSIS — N183 Chronic kidney disease, stage 3 unspecified: Secondary | ICD-10-CM

## 2023-09-19 ENCOUNTER — Other Ambulatory Visit: Payer: Self-pay

## 2023-09-19 ENCOUNTER — Encounter: Payer: Self-pay | Admitting: Internal Medicine

## 2023-09-19 ENCOUNTER — Other Ambulatory Visit (HOSPITAL_COMMUNITY): Payer: Self-pay

## 2023-09-19 ENCOUNTER — Ambulatory Visit: Attending: Internal Medicine | Admitting: Internal Medicine

## 2023-09-19 VITALS — BP 120/67 | HR 80 | Temp 98.1°F | Ht 69.0 in | Wt 189.0 lb

## 2023-09-19 DIAGNOSIS — Z23 Encounter for immunization: Secondary | ICD-10-CM

## 2023-09-19 DIAGNOSIS — I10 Essential (primary) hypertension: Secondary | ICD-10-CM

## 2023-09-19 DIAGNOSIS — G4733 Obstructive sleep apnea (adult) (pediatric): Secondary | ICD-10-CM

## 2023-09-19 DIAGNOSIS — N1832 Chronic kidney disease, stage 3b: Secondary | ICD-10-CM

## 2023-09-19 DIAGNOSIS — I5022 Chronic systolic (congestive) heart failure: Secondary | ICD-10-CM

## 2023-09-19 DIAGNOSIS — I4819 Other persistent atrial fibrillation: Secondary | ICD-10-CM | POA: Diagnosis not present

## 2023-09-19 DIAGNOSIS — Z1211 Encounter for screening for malignant neoplasm of colon: Secondary | ICD-10-CM

## 2023-09-19 NOTE — Patient Instructions (Signed)
 VISIT SUMMARY:  [name], a 63 year old male with multiple chronic conditions, visited for a follow-up on his ongoing health management. His conditions include hypertension, peripheral artery disease, hyperlipidemia, sleep apnea, atrial fibrillation, congestive heart failure, and chronic kidney disease. He is on a comprehensive medication regimen and has an upcoming ablation procedure for atrial fibrillation. He is also due for a pneumonia vaccine and prefers a stool kit for colon cancer screening.  YOUR PLAN:  -CONGESTIVE HEART FAILURE: Congestive heart failure means your heart doesn't pump blood as well as it should. Continue taking Entresto , Bidil , and furosemide  as needed. We will verify your potassium dosage at your next visit.  -ATRIAL FIBRILLATION: Atrial fibrillation is an irregular and often rapid heart rate. Continue taking Eliquis  and proceed with your scheduled ablation procedure in July.  -HYPERTENSION: Hypertension is high blood pressure. Your blood pressure is well-controlled at 120/67 mmHg. Continue taking chlorthalidone , carvedilol , amlodipine , and Entresto . Please monitor your blood pressure at home consistently.  -CHRONIC KIDNEY DISEASE: Chronic kidney disease means your kidneys are damaged and can't filter blood as well as they should. We are awaiting your appointment with [facility]. Continue to avoid NSAIDs like ibuprofen , Aleve , Advil , and Naprosyn .  -SLEEP APNEA: Sleep apnea is a sleep disorder where breathing repeatedly stops and starts. You do not have a CPAP machine but feel rested. Please ask your fiance about any snoring or apnea episodes. If these are reported, we may refer you for another sleep study.  -GENERAL HEALTH MAINTENANCE: You are due for a pneumonia vaccine and a colon cancer screening. We will administer the pneumonia vaccine and order a Cologuard stool kit for colon cancer screening.  INSTRUCTIONS:  Please complete your Medicare  wellness visit on October 31, 2023.

## 2023-09-19 NOTE — Progress Notes (Signed)
 Patient ID: [name], male    DOB: 10/12/1960  MRN: [id number]  CC: Establish Care (Est care (Dr.Wrights pt). Med refills. /No questions / concerns/Yes to pneumonia vax)   Subjective: [name] is a 63 y.o. male who presents for chronic ds management.  Previous PCP is Dr. Brent Cambric who has retired. 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  History of Present Illness [name] is a 63 year old male with hypertension, peripheral artery disease, hyperlipidemia, sleep apnea, atrial fibrillation, congestive heart failure, and chronic kidney disease who presents for a follow-up visit for ongoing management of his chronic conditions.  HTN/CHF/Atrial fib: He does not have his medications with him and does not recall some of the names.  His medication regimen includes chlorthalidone , carvedilol , amlodipine , Entresto , Bidil , and furosemide  as needed. He takes Eliquis  and Amiodarone  for atrial fibrillation. Blood pressure monitoring at home is inconsistent. He denies chest pain or shortness of breath. Recent leg swelling resolved with furosemide  and potassium, though he is unsure of the potassium dosage. No palpitations. Atrial fibrillation management includes a potential ablation procedure scheduled for July. No bruising or bleeding on Eliquis .   CKD: Kidney function has been declining since April 2025, and he awaits an appointment with a kidney specialist.  Most recent GFR was 44 with creatinine of 1.74.  He avoids NSAIDs.  Diagnosed with OSA in 2022.  He was diagnosed with severe sleep apnea.  Looks like at 1 point he was on BiPAP but it was taken away for inconsistent use.  Patient states he was  using it but was in and out of the hospital during that time.   He feels rested in the morning and denies snoring, sleeps with his fiance.  States she has never told him that he snores loud or stops breathing intermittently in his sleep.    HM: He is due for a pneumonia vaccine and prefers a stool kit for colon cancer screening. No known family history of colon cancer.    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   Chest pain 06/02/2022   Anemia 06/02/2022   Thrombocytopenia (HCC) 06/02/2022   Hyponatremia 06/02/2022   Pulmonary nodules 06/02/2022   Syncope 03/24/2022   (HFpEF) heart failure with preserved ejection fraction (HCC) 03/24/2022   Hypomagnesemia 03/24/2022   Atrial fibrillation with RVR (HCC) 02/18/2022   CAD (coronary artery disease) 02/18/2022   SIRS (systemic inflammatory response syndrome) (HCC) 02/18/2022   Dizziness 02/18/2022   Urinary tract infection 02/18/2022   Abnormal chest x-Justus 02/18/2022   History of CVA (cerebrovascular accident) 02/18/2022   History of septic arthritis 02/18/2022   Severe sepsis (HCC) 01/15/2022   Obesity (BMI 30-39.9) 01/03/2022   Iron deficiency anemia 01/03/2022   Gout of right knee 01/03/2022   Sepsis due to group A Streptococcus with acute renal failure without septic shock (HCC)    Acute renal failure superimposed on chronic  kidney disease (HCC) 01/01/2022   Magnesium  deficiency 11/23/2021   Atherosclerosis of artery of extremity with ulceration (HCC) 09/20/2021   Open wound of lower extremity, right, initial encounter 09/20/2021   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   Severe aortic stenosis    Persistent  atrial fibrillation (HCC) 05/05/2021   Secondary hypercoagulable state (HCC) 05/05/2021   Chronic back pain 11/05/2020   History of chronic skin ulcer 11/05/2020   OA (osteoarthritis) 11/05/2020   OSA (obstructive sleep apnea) 11/05/2020   Chronic kidney disease, stage 3a (HCC) 02/19/2020   Acute on chronic systolic CHF (congestive heart failure) (HCC) 07/25/2017   Pain and swelling of right knee    HTN (hypertension) 10/19/2011   Gout 10/19/2011   Hyperlipemia 10/19/2011   GERD (gastroesophageal reflux disease) 08/27/2011     Current Outpatient Medications on File Prior to Visit  Medication Sig Dispense Refill   acetaminophen  (TYLENOL ) 500 MG tablet Take 500 mg by mouth 3 (three) times daily as needed for moderate pain.     allopurinol  (ZYLOPRIM ) 100 MG tablet Take 1 tablet (100 mg total) by mouth daily. 90 tablet 1   amiodarone  (PACERONE ) 100 MG tablet Take 1 tablet (100 mg total) by mouth daily. 90 tablet 0   amLODipine  (NORVASC ) 5 MG tablet Take 1 tablet (5 mg total) by mouth daily. 90 tablet 1   apixaban  (ELIQUIS ) 5 MG TABS tablet Take 1 tablet (5 mg total) by mouth 2 (two) times daily. 180 tablet 3   aspirin  EC 81 MG tablet Take 81 mg by mouth daily. Swallow whole.     atorvastatin  (LIPITOR) 20 MG tablet Take 1 tablet (20 mg total) by mouth daily. 90 tablet 2   Blood Pressure Monitoring (BLOOD PRESSURE KIT) DEVI Use to measure blood pressure 1 each 0   carvedilol  (COREG ) 25 MG tablet Take 1 tablet (25 mg total) by mouth 2 (two) times daily with a meal. 90 tablet 1   chlorthalidone  (HYGROTON ) 50 MG tablet Take 1 tablet (50 mg total) by mouth daily. 90 tablet 1   furosemide  (LASIX ) 40 MG tablet Take 1 tablet (40 mg total) by mouth as needed for fluid. 30 tablet 3   isosorbide -hydrALAZINE  (BIDIL ) 20-37.5 MG tablet Take 2 tablets by mouth 3 (three) times daily. 540 tablet 1   omeprazole  (PRILOSEC) 40 MG capsule Take 1 capsule (40 mg total) by mouth daily. 90 capsule 3    sacubitril -valsartan  (ENTRESTO ) 97-103 MG Take 1 tablet by mouth 2 (two) times daily. 60 tablet 2   No current facility-administered medications on file prior to visit.    Allergies  Allergen Reactions   Adhesive [Tape] Other (See Comments)    Makes the skin feel as if it is burning, will also bruise the skin. Pt. prefers paper tape   Latex Hives and Itching    Social History   Socioeconomic History   Marital status: Married    Spouse name: Not on file   Number of children: 3   Years of education: Not on file   Highest education level: Not on file  Occupational History   Occupation: Scientific laboratory technician, strenuous    Employer: [employer]   Occupation: Retired  Tobacco Use   Smoking status: Never   Smokeless tobacco: Never  Vaping Use   Vaping status: Never Used  Substance and Sexual Activity   Alcohol use: No    Alcohol/week: 0.0 standard drinks of alcohol   Drug use: Not Currently    Types: Marijuana  Comment: 07/25/2017 nothing since ~ 2010   Sexual activity: Yes    Partners: Female    Birth control/protection: Condom  Other Topics Concern   Not on file  Social History Narrative   ** Merged History Encounter **       Social Drivers of Health   Financial Resource Strain: Low Risk  (09/19/2023)   Overall Financial Resource Strain (CARDIA)    Difficulty of Paying Living Expenses: Not hard at all  Food Insecurity: No Food Insecurity (09/19/2023)   Hunger Vital Sign    Worried About Running Out of Food in the Last Year: Never true    Ran Out of Food in the Last Year: Never true  Transportation Needs: No Transportation Needs (09/19/2023)   PRAPARE - Administrator, Civil Service (Medical): No    Lack of Transportation (Non-Medical): No  Physical Activity: Insufficiently Active (09/19/2023)   Exercise Vital Sign    Days of Exercise per Week: 2 days    Minutes of Exercise per Session: 30 min  Stress: No Stress Concern Present (09/19/2023)   [employer] of  Occupational Health - Occupational Stress Questionnaire    Feeling of Stress: Not at all  Social Connections: Moderately Integrated (09/19/2023)   Social Connection and Isolation Panel    Frequency of Communication with Friends and Family: Three times a week    Frequency of Social Gatherings with Friends and Family: Three times a week    Attends Religious Services: More than 4 times per year    Active Member of Clubs or Organizations: Yes    Attends Banker Meetings: More than 4 times per year    Marital Status: Divorced  Intimate Partner Violence: Not At Risk (09/19/2023)   Humiliation, Afraid, Rape, and Kick questionnaire    Fear of Current or Ex-Partner: No    Emotionally Abused: No    Physically Abused: No    Sexually Abused: No    Family History  Problem Relation Age of Onset   Kidney failure Mother    Heart attack Father    Asthma Daughter    Hypertension Other     Past Surgical History:  Procedure Laterality Date   ABDOMINAL AORTOGRAM W/LOWER EXTREMITY Left 10/22/2021   Procedure: ABDOMINAL AORTOGRAM W/LOWER EXTREMITY;  Surgeon: Carlene Che, MD;  Location: [facility] INVASIVE CV LAB;  Service: Cardiovascular;  Laterality: Left;   CENTRAL LINE INSERTION  06/03/2022   Procedure: CENTRAL LINE INSERTION;  Surgeon: Mardell Shade, MD;  Location: [facility];  Service: Cardiovascular;;   COLONOSCOPY  03/2011   INTRAOPERATIVE TRANSTHORACIC ECHOCARDIOGRAM N/A 06/09/2022   Procedure: INTRAOPERATIVE TRANSTHORACIC ECHOCARDIOGRAM;  Surgeon: Arnoldo Lapping, MD;  Location: [facility] INVASIVE CV LAB;  Service: Open Heart Surgery;  Laterality: N/A;   IRRIGATION AND DEBRIDEMENT KNEE Right 01/17/2022   Procedure: IRRIGATION AND DEBRIDEMENT KNEE;  Surgeon: Hardy Lia, MD;  Location[facility];  Service: Orthopedics;  Laterality: Right;   KNEE ARTHROSCOPY Right 2004   w/ligament repair in kneecap   MULTIPLE TOOTH EXTRACTIONS  06/2010   full mouth   PERIPHERAL VASCULAR BALLOON  ANGIOPLASTY Left 10/22/2021   Procedure: PERIPHERAL VASCULAR BALLOON ANGIOPLASTY;  Surgeon: Carlene Che, MD;  Location: [facility] INVASIVE CV LAB;  Service: Cardiovascular;  Laterality: Left;  TP trunk/ Peroneal   RIGHT HEART CATH N/A 06/03/2022   Procedure: RIGHT HEART CATH;  Surgeon: Mardell Shade, MD;  Location: [facility] INVASIVE CV LAB;  Service: Cardiovascular;  Laterality: N/A;   RIGHT/LEFT  HEART CATH AND CORONARY ANGIOGRAPHY N/A 05/27/2021   Procedure: RIGHT/LEFT HEART CATH AND CORONARY ANGIOGRAPHY;  Surgeon: Odie Benne, MD;  Location: [facility] INVASIVE CV LAB;  Service: Cardiovascular;  Laterality: N/A;   TEE WITHOUT CARDIOVERSION N/A 07/22/2015   Procedure: TRANSESOPHAGEAL ECHOCARDIOGRAM (TEE);  Surgeon: Loyde Rule, MD;  Location: [facility] ENDOSCOPY;  Service: Cardiovascular;  Laterality: N/A;   TONSILLECTOMY         TRANSCATHETER AORTIC VALVE REPLACEMENT, TRANSFEMORAL N/A 06/09/2022   Procedure: Transcatheter Aortic Valve Replacement, Transfemoral;  Surgeon: Arnoldo Lapping, MD;  Location: [facility] INVASIVE CV LAB;  Service: Open Heart Surgery;  Laterality: N/A;   UPPER GASTROINTESTINAL ENDOSCOPY  03/2011    ROS: Review of Systems Negative except as stated above  PHYSICAL EXAM: BP 120/67 (BP Location: Left Arm, Patient Position: Sitting, Cuff Size: Normal)   Pulse 80   Temp 98.1 F (36.7 C) (Oral)   Ht 5' 9 (1.753 m)   Wt 189 lb (85.7 kg)   SpO2 98%   BMI 27.91 kg/m   Wt Readings from Last 3 Encounters:  09/19/23 189 lb (85.7 kg)  09/01/23 183 lb (83 kg)  08/14/23 182 lb (82.6 kg)    Physical Exam   General appearance - alert, well appearing, and in no distress Mental status - normal mood, behavior, speech, dress, motor activity, and thought processes Neck - supple, no significant adenopathy Chest - clear to auscultation, no wheezes, rales or rhonchi, symmetric air entry Heart -heart rate is controlled but is irregularly irregular Extremities -no lower extremity  edema.     Latest Ref Rng & Units 09/15/2023    9:50 AM 08/14/2023    9:01 AM 07/21/2023   10:04 AM  CMP  Glucose 70 - 99 mg/dL 161  95  92   BUN 8 - 27 mg/dL 32  36  20   Creatinine 0.76 - 1.27 mg/dL 0.96  0.45  4.09   Sodium 134 - 144 mmol/L 141  142  138   Potassium 3.5 - 5.2 mmol/L 4.0  4.0  3.8   Chloride 96 - 106 mmol/L 104  105  109   CO2 20 - 29 mmol/L 21  20  19    Calcium  8.6 - 10.2 mg/dL 81.1  9.9  9.3   Total Protein 6.0 - 8.5 g/dL 7.7  7.6    Total Bilirubin 0.0 - 1.2 mg/dL 0.3  0.3    Alkaline Phos 44 - 121 IU/L 82  91    AST 0 - 40 IU/L 21  22    ALT 0 - 44 IU/L 13  17     Lipid Panel     Component Value Date/Time   CHOL 174 08/14/2023 0901   TRIG 149 08/14/2023 0901   HDL 33 (L) 08/14/2023 0901   CHOLHDL 5.3 (H) 08/14/2023 0901   CHOLHDL 6.6 08/27/2011 0535   VLDL 48 (H) 08/27/2011 0535   LDLCALC 114 (H) 08/14/2023 0901    CBC    Component Value Date/Time   WBC 3.8 08/14/2023 0901   WBC 3.3 (L) 07/21/2023 1004   RBC 5.33 08/14/2023 0901   RBC 4.59 07/21/2023 1004   HGB 12.1 (L) 08/14/2023 0901   HCT 41.6 08/14/2023 0901   PLT 157 08/14/2023 0901   MCV 78 (L) 08/14/2023 0901   MCH 22.7 (L) 08/14/2023 0901   MCH 24.0 (L) 07/21/2023 1004   MCHC 29.1 (L) 08/14/2023 0901   MCHC 31.7 07/21/2023 1004   RDW 15.8 (H) 08/14/2023  0901   LYMPHSABS 1.2 08/14/2023 0901   MONOABS 0.3 07/21/2023 1004   EOSABS 0.3 08/14/2023 0901   BASOSABS 0.0 08/14/2023 0901    ASSESSMENT AND PLAN: 1. Essential hypertension (Primary) At goal.  Continue carvedilol  25 mg daily, chlorthalidone  50 mg daily, BiDil  20/37.5 mg 3 times a day, Entresto  97/103 mg twice a day, amlodipine  5 mg daily  2. Chronic kidney disease, stage 3b (HCC) Await appointment with nephrology.  Volume status/CHF plays a role.  3. Chronic systolic congestive heart failure (HCC) Compensated at this time.  Continue Entresto , BiDil , chlorthalidone , carvedilol  as prescribed.  Continue furosemide  as  needed.  4. Persistent atrial fibrillation (HCC) On amiodarone  and Eliquis  with plans for ablation sometime this summer  5. OSA (obstructive sleep apnea) Not on CPAP/BiPAP.  Advised patient to ask his fiance whether he snores in his sleep or intermittently stops breathing.  We can revisit this on a subsequent visit about repeating sleep study  6. Screening for colon cancer - Cologuard  7. Need for vaccination against Streptococcus pneumoniae - Pneumococcal conjugate vaccine 20-valent   Patient was given the opportunity to ask questions.  Patient verbalized understanding of the plan and was able to repeat key elements of the plan.   This documentation was completed using Paediatric nurse.  Any transcriptional errors are unintentional.  No orders of the defined types were placed in this encounter.    Requested Prescriptions    No prescriptions requested or ordered in this encounter    No follow-ups on file.  Concetta Dee, MD, FACP

## 2023-09-27 ENCOUNTER — Other Ambulatory Visit (HOSPITAL_COMMUNITY): Payer: Self-pay

## 2023-09-27 ENCOUNTER — Other Ambulatory Visit: Payer: Self-pay

## 2023-10-09 ENCOUNTER — Other Ambulatory Visit: Payer: Self-pay

## 2023-10-11 ENCOUNTER — Other Ambulatory Visit (HOSPITAL_COMMUNITY): Payer: Self-pay

## 2023-10-12 ENCOUNTER — Other Ambulatory Visit: Payer: Self-pay | Admitting: Critical Care Medicine

## 2023-10-12 ENCOUNTER — Other Ambulatory Visit: Payer: Self-pay | Admitting: Nephrology

## 2023-10-12 DIAGNOSIS — E782 Mixed hyperlipidemia: Secondary | ICD-10-CM

## 2023-10-12 DIAGNOSIS — N1832 Chronic kidney disease, stage 3b: Secondary | ICD-10-CM

## 2023-10-12 DIAGNOSIS — I350 Nonrheumatic aortic (valve) stenosis: Secondary | ICD-10-CM

## 2023-10-12 LAB — LAB REPORT - SCANNED: EGFR: 48

## 2023-10-13 ENCOUNTER — Ambulatory Visit
Admission: RE | Admit: 2023-10-13 | Discharge: 2023-10-13 | Disposition: A | Discharge status: Home / Self Care | Source: Ambulatory Visit | Attending: Nephrology | Admitting: Nephrology

## 2023-10-13 DIAGNOSIS — N1832 Chronic kidney disease, stage 3b: Secondary | ICD-10-CM

## 2023-10-14 LAB — COLOGUARD: COLOGUARD: NEGATIVE

## 2023-10-15 ENCOUNTER — Ambulatory Visit: Payer: Self-pay | Admitting: Internal Medicine

## 2023-10-17 ENCOUNTER — Ambulatory Visit: Attending: Cardiology | Admitting: Pharmacist

## 2023-10-17 ENCOUNTER — Other Ambulatory Visit (HOSPITAL_COMMUNITY): Payer: Self-pay

## 2023-10-17 VITALS — BP 122/82 | HR 71

## 2023-10-17 DIAGNOSIS — I10 Essential (primary) hypertension: Secondary | ICD-10-CM | POA: Diagnosis not present
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
## 2023-10-17 NOTE — Patient Instructions (Addendum)
 Please try to take Eliquis , carvedilol  and Entresto  at 7AM and 7PM (~12hr apart)  Please check blood pressure at home Try to use your OTC to buy an OMRON upper arm blood pressure cuff  Please send me blood pressure readings in 3 weeks  Your blood pressure goal is < 130/17mmHg   Important lifestyle changes to control high blood pressure  Intervention  Effect on the BP   Weight loss Weight loss is one of the most effective lifestyle changes for controlling blood pressure. If you're overweight or obese, losing even a small amount of weight can help reduce blood pressure.    Blood pressure can decrease by 1 millimeter of mercury (mmHg) with each kilogram (about 2.2 pounds) of weight lost.   Exercise regularly As a general goal, aim for 30 minutes of moderate physical activity every day.    Regular physical activity can lower blood pressure by 5 - 8 mmHg.   Eat a healthy diet Eat a diet rich in whole grains, fruits, vegetables, lean meat, and low-fat dairy products. Limit processed foods, saturated fat, and sweets.    A heart-healthy diet can lower high blood pressure by 10 mmHg.   Reduce salt (sodium) in your diet Aim for 000mg  of sodium each day. Avoid deli meats, canned food, and frozen microwave meals which are high in sodium.     Limiting sodium can reduce blood pressure by 5 mmHg.   Limit alcohol One drink equals 12 ounces of beer, 5 ounces of wine, or 1.5 ounces of 80-proof liquor.    Limiting alcohol to < 1 drink a day for women or < 2 drinks a day for men can help lower blood pressure by about 4 mmHg.   To check your pressure at home you will need to:   Sit up in a chair, with feet flat on the floor and back supported. Do not cross your ankles or legs. Rest your left arm so that the cuff is about heart level. If the cuff goes on your upper arm, then just relax your arm on the table, arm of the chair, or your lap. If you have a wrist cuff, hold your wrist against  your chest at heart level. Place the cuff snugly around your arm, about 1 inch above the crease of your elbow. The cords should be inside the groove of your elbow.  Sit quietly, with the cuff in place, for about 5 minutes. Then press the power button to start a reading. Do not talk or move while the reading is taking place.  Record your readings on a sheet of paper. Although most cuffs have a memory, it is often easier to see a pattern developing when the numbers are all in front of you.  You can repeat the reading after 1-3 minutes if it is recommended.   Make sure your bladder is empty and you have not had caffeine or tobacco within the last 30 minutes   Always bring your blood pressure log with you to your appointments. If you have not brought your monitor in to be double checked for accuracy, please bring it to your next appointment.   You can find a list of validated (accurate) blood pressure cuffs at: validatebp.org

## 2023-10-17 NOTE — Assessment & Plan Note (Signed)
 Assessment: Blood pressure well-controlled in clinic today Unclear if blood pressure control at home Home cuff inaccurate Patient reports medication compliance-we reviewed what each medication is for He walks 15 to 30 minutes most days of the week Patient tolerating medications without any dizziness or lightheadedness Rare use of furosemide  Does not weigh himself at home but will take furosemide  if he starts to notice swelling Denies any shortness of breath  Plan: Continue Entresto  97/103mg  twice a day, BiDil  20/37.5mg  (2 tablets) three times a day, furosemide  40mg  as needed, carvedilol  25mg  twice a day, amlodipine  5mg  daily, chlorthalidone  50 mg daily Use OTC card to purchase Omron upper blood pressure cuff Monitor blood pressure at home and send me blood pressure readings in 3 weeks

## 2023-10-18 ENCOUNTER — Other Ambulatory Visit (HOSPITAL_COMMUNITY): Payer: Self-pay

## 2023-10-18 ENCOUNTER — Other Ambulatory Visit: Payer: Self-pay

## 2023-10-18 MED ORDER — ALLOPURINOL 100 MG PO TABS
100.0000 mg | ORAL_TABLET | Freq: Every day | ORAL | 1 refills | Status: DC
  Filled 2023-10-18 (×2): qty 90, 90d supply, fill #0

## 2023-10-18 MED ORDER — OMEPRAZOLE 40 MG PO CPDR
40.0000 mg | DELAYED_RELEASE_CAPSULE | Freq: Every day | ORAL | 3 refills | Status: AC
  Filled 2023-10-18 (×2): qty 90, 90d supply, fill #0
  Filled 2023-11-24 – 2023-12-25 (×3): qty 90, 90d supply, fill #1
  Filled 2024-04-15: qty 90, 90d supply, fill #2

## 2023-10-19 ENCOUNTER — Other Ambulatory Visit (HOSPITAL_COMMUNITY): Payer: Self-pay

## 2023-10-20 ENCOUNTER — Encounter: Payer: Self-pay | Admitting: Pharmacist

## 2023-10-20 ENCOUNTER — Ambulatory Visit: Attending: Family Medicine | Admitting: Pharmacist

## 2023-10-20 VITALS — BP 99/58 | HR 80

## 2023-10-20 DIAGNOSIS — I10 Essential (primary) hypertension: Secondary | ICD-10-CM

## 2023-10-20 NOTE — Progress Notes (Signed)
 S:     No chief complaint on file.  63 y.o. male who presents for hypertension evaluation, education, and management. PMH is significant for HTN, CHF (w/ hx of acute CHF exacerbation, last EF 60-65%), a fib, mesenteric artery stenosis, atherosclerosis/CAD, TAVR, cardiac amyloidosis, OSA, GERD, Gout, CKD IIIa, HLD, obesity, chronic back pain, hx of CVA.   Patient was originally referred by Dr. Brien, on 07/20/2023. I saw in May and June with our most recent visit together 09/15/2023. At that visit, I set him with delivery and adherence packaging from [facility]. This has greatly improved his adherence and his BP.   Since my last visit with him in June, he was seen by Dr. Vicci 09/19/23 and CPP Eleanor Crews on 10/17/2023. BP readings at both of those visits were at goal.   Of note, labs have shown declining renal function. He has established with Nephrology. While his last eGFR was 44 (Scr 1.74) with us  on 09/15/23, [facility] got labs on 10/12/2023 that showed improved Scr of 1.59 w/ an eGFR of  48. K level was normal. Calcium  level was normal.   Today, patient arrives in good spirits and presents without assistance. Denies dizziness, headache, blurred vision, swelling.    Family/Social history:  Fhx: kidney failure, MI Tobacco: never smoker  Alcohol: none reported   Medication adherence reported with medications. Fill dates are up to date per pharmacy dispense hx.   Current antihypertensives include: amlodipine  5mg  daily (90 day supply dispensed 09/27/2023), Bidil  40-75 TID (30 day supply dispensed 10/10/2023), carvedilol  25mg  BID (45 day supply dispensed 09/15/2023), chlorthalidone  50 mg daily (90 day supply dispensed 09/27/2023), Entresto  97-103 mg BID (30 day supply dispensed 10/10/2023)  Reported home BP readings: none reported   Patient reported dietary habits:  -Compliant with sodium restriction -Denies drinking excessive amounts of caffeine   Patient-reported exercise habits: walks  daily   O:  Vitals:   10/20/23 0919  BP: (!) 99/58  Pulse: 80    Last 3 Office BP readings: BP Readings from Last 3 Encounters:  10/20/23 (!) 99/58  10/17/23 122/82  09/19/23 120/67    BMET    Component Value Date/Time   NA 141 09/15/2023 0950   K 4.0 09/15/2023 0950   CL 104 09/15/2023 0950   CO2 21 09/15/2023 0950   GLUCOSE 113 (H) 09/15/2023 0950   GLUCOSE 92 07/21/2023 1004   BUN 32 (H) 09/15/2023 0950   CREATININE 1.74 (H) 09/15/2023 0950   CREATININE 1.75 (H) 02/16/2022 1122   CALCIUM  10.4 (H) 09/15/2023 0950   GFRNONAA 60 (L) 07/21/2023 1004   GFRAA 69 04/29/2020 1453    Renal function: CrCl cannot be calculated (Patient's most recent lab result is older than the maximum 21 days allowed.).  Clinical ASCVD: No  The ASCVD Risk score (Arnett DK, et al., 2019) failed to calculate for the following reasons:   Risk score cannot be calculated because patient has a medical history suggesting prior/existing ASCVD  Patient is participating in a Managed Medicaid Plan: No    A/P: Hypertension diagnosed currently at goal to hypotensive today. Pt is asymptomatic. Tells me his BP ranges at home. He had a visit earlier this week where BP was at goal, not hypotensive. BP goal < 130/80 mmHg. Given his comorbid conditions, I would like to continue current regimen as his BP is right at the hypotension cutoff and he is asymptomatic today. We will continue to monitor this closely. I also let him know to contact  me if he has refill needs. -Continued current regimen.  -Coordinated fills and delivery with our pharmacy. -F/u labs ordered - none today. -Counseled on lifestyle modifications for blood pressure control including reduced dietary sodium, increased exercise, adequate sleep. -Encouraged patient to check BP at home and bring log of readings to next visit. Counseled on proper use of home BP cuff.   Results reviewed and written information provided.    Written patient  instructions provided. Patient verbalized understanding of treatment plan.  Total time in face to face counseling 30 minutes.    Follow-up:  Pharmacist prn. Next visit with Dr. Kate 11/07/2023.   Herlene Fleeta Morris, PharmD, JAQUELINE, CPP Clinical Pharmacist [facility] [phone]

## 2023-10-24 ENCOUNTER — Encounter: Payer: Self-pay | Admitting: Pharmacist

## 2023-10-24 ENCOUNTER — Ambulatory Visit: Payer: Self-pay | Admitting: *Deleted

## 2023-10-24 LAB — CBC
Hematocrit: 39.6 % (ref 37.5–51.0)
Hemoglobin: 11.9 g/dL — ABNORMAL LOW (ref 13.0–17.7)
MCH: 24.1 pg — ABNORMAL LOW (ref 26.6–33.0)
MCHC: 30.1 g/dL — ABNORMAL LOW (ref 31.5–35.7)
MCV: 80 fL (ref 79–97)
Platelets: 170 x10E3/uL (ref 150–450)
RBC: 4.94 x10E6/uL (ref 4.14–5.80)
RDW: 14.8 % (ref 11.6–15.4)
WBC: 4.5 x10E3/uL (ref 3.4–10.8)

## 2023-10-24 LAB — BASIC METABOLIC PANEL WITH GFR
BUN/Creatinine Ratio: 25 — ABNORMAL HIGH (ref 10–24)
BUN: 50 mg/dL — ABNORMAL HIGH (ref 8–27)
CO2: 19 mmol/L — ABNORMAL LOW (ref 20–29)
Calcium: 9.6 mg/dL (ref 8.6–10.2)
Chloride: 107 mmol/L — ABNORMAL HIGH (ref 96–106)
Creatinine, Ser: 2.04 mg/dL — ABNORMAL HIGH (ref 0.76–1.27)
Glucose: 109 mg/dL — ABNORMAL HIGH (ref 70–99)
Potassium: 4.2 mmol/L (ref 3.5–5.2)
Sodium: 140 mmol/L (ref 134–144)
eGFR: 36 mL/min/1.73 — ABNORMAL LOW (ref >59)

## 2023-10-27 ENCOUNTER — Ambulatory Visit (HOSPITAL_COMMUNITY)

## 2023-10-31 ENCOUNTER — Other Ambulatory Visit (HOSPITAL_COMMUNITY): Payer: Self-pay | Admitting: Nephrology

## 2023-10-31 ENCOUNTER — Ambulatory Visit: Payer: 59

## 2023-10-31 DIAGNOSIS — R809 Proteinuria, unspecified: Secondary | ICD-10-CM
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
## 2023-11-07 ENCOUNTER — Encounter: Payer: Self-pay | Admitting: Cardiology

## 2023-11-07 ENCOUNTER — Ambulatory Visit: Attending: Cardiology | Admitting: Cardiology

## 2023-11-07 ENCOUNTER — Other Ambulatory Visit: Payer: Self-pay | Admitting: Family Medicine

## 2023-11-07 ENCOUNTER — Other Ambulatory Visit: Payer: Self-pay

## 2023-11-07 VITALS — BP 116/70 | HR 83 | Ht 69.0 in | Wt 193.7 lb

## 2023-11-07 DIAGNOSIS — Z952 Presence of prosthetic heart valve: Secondary | ICD-10-CM

## 2023-11-07 DIAGNOSIS — I1A0 Resistant hypertension: Secondary | ICD-10-CM | POA: Diagnosis not present

## 2023-11-07 DIAGNOSIS — N1832 Chronic kidney disease, stage 3b: Secondary | ICD-10-CM

## 2023-11-07 DIAGNOSIS — I5032 Chronic diastolic (congestive) heart failure: Secondary | ICD-10-CM

## 2023-11-07 DIAGNOSIS — E785 Hyperlipidemia, unspecified: Secondary | ICD-10-CM

## 2023-11-07 DIAGNOSIS — I739 Peripheral vascular disease, unspecified: Secondary | ICD-10-CM

## 2023-11-07 DIAGNOSIS — I4819 Other persistent atrial fibrillation: Secondary | ICD-10-CM

## 2023-11-07 LAB — LIPID PANEL

## 2023-11-07 NOTE — Patient Instructions (Signed)
 Medication Instructions:  CONTINUE CURRENT MEDICATION *If you need a refill on your cardiac medications before your next appointment, please call your pharmacy*  Lab Work: BMET, Mg, Lipid Panel If you have labs (blood work) drawn today and your tests are completely normal, you will receive your results only by: MyChart Message (if you have MyChart) OR A paper copy in the mail If you have any lab test that is abnormal or we need to change your treatment, we will call you to review the results.  Testing/Procedures: Your physician has requested that you have an ankle brachial index (ABI). During this test an ultrasound and blood pressure cuff are used to evaluate the arteries that supply the arms and legs with blood. Allow thirty minutes for this exam. There are no restrictions or special instructions. This will take place at [address], 4th floor   Please note: We ask at that you not bring children with you during ultrasound (echo/ vascular) testing. Due to room size and safety concerns, children are not allowed in the ultrasound rooms during exams. Our front office staff cannot provide observation of children in our lobby area while testing is being conducted. An adult accompanying a patient to their appointment will only be allowed in the ultrasound room at the discretion of the ultrasound technician under special circumstances. We apologize for any inconvenience.  Follow-Up: At [facility], you and your health needs are our priority.  As part of our continuing mission to provide you with exceptional heart care, our providers are all part of one team.  This team includes your primary Cardiologist (physician) and Advanced Practice Providers or APPs (Physician Assistants and Nurse Practitioners) who all work together to provide you with the care you need, when you need it.  Your next appointment:   4 month(s)  Provider:   Lonni LITTIE Nanas, MD    We recommend signing up  for the patient portal called MyChart.  Sign up information is provided on this After Visit Summary.  MyChart is used to connect with patients for Virtual Visits (Telemedicine).  Patients are able to view lab/test results, encounter notes, upcoming appointments, etc.  Non-urgent messages can be sent to your provider as well.   To learn more about what you can do with MyChart, go to [email or web address].   Other Instructions none

## 2023-11-08 ENCOUNTER — Other Ambulatory Visit (HOSPITAL_COMMUNITY): Payer: Self-pay

## 2023-11-08 ENCOUNTER — Other Ambulatory Visit: Payer: Self-pay

## 2023-11-08 ENCOUNTER — Ambulatory Visit: Payer: Self-pay | Admitting: Cardiology

## 2023-11-08 DIAGNOSIS — I771 Stricture of artery: Secondary | ICD-10-CM

## 2023-11-08 DIAGNOSIS — I10 Essential (primary) hypertension: Secondary | ICD-10-CM

## 2023-11-08 LAB — MAGNESIUM: Magnesium: 1.5 mg/dL — ABNORMAL LOW (ref 1.6–2.3)

## 2023-11-08 MED ORDER — SACUBITRIL-VALSARTAN 97-103 MG PO TABS
1.0000 | ORAL_TABLET | Freq: Two times a day (BID) | ORAL | 1 refills | Status: DC
  Filled 2023-11-08: qty 180, 90d supply, fill #0
  Filled 2023-11-24: qty 180, 90d supply, fill #1
  Filled 2024-02-05: qty 60, 30d supply, fill #1
  Filled 2024-03-01: qty 60, 30d supply, fill #2
  Filled 2024-03-31: qty 60, 30d supply, fill #3

## 2023-11-08 NOTE — Telephone Encounter (Signed)
 Requested medication (s) are due for refill today: Yes  Requested medication (s) are on the active medication list: Yes  Last refill:  08/15/23  Future visit scheduled: Yes  Notes to clinic:  Manual review.    Requested Prescriptions  Pending Prescriptions Disp Refills   sacubitril -valsartan  (ENTRESTO ) 97-103 MG 60 tablet 2    Sig: Take 1 tablet by mouth 2 (two) times daily.     Off-Protocol Failed - 11/08/2023  3:10 PM      Failed - Medication not assigned to a protocol, review manually.      Passed - Valid encounter within last 12 months    Recent Outpatient Visits           2 weeks ago Primary hypertension   [facility] [place]. [facility] Fleeta Morris, Garnette CROME, RPH-CPP   1 month ago Essential hypertension   [facility] Dept Of [place]. [facility] Vicci Sober B, MD   1 month ago Acute on chronic diastolic (congestive) heart failure [facility])   [facility] Of [place]. [facility] Fleeta Morris Garnette CROME, RPH-CPP   2 months ago Primary hypertension   [facility] [place] - [facility] Dept Of [place]. [facility] Fleeta Morris Garnette CROME, RPH-CPP   3 months ago Hyperkalemia   [facility] - A Dept Of [place]. [facility] Brien Belvie BRAVO, MD       Future Appointments             In 2 months Vicci Sober NOVAK, MD [facility] - A Dept Of [place]. [facility]   In 4 months Kate Lonni CROME, MD [facility] A Dept of [facility] [place]. [facility] [employer], [employer]

## 2023-11-09 ENCOUNTER — Telehealth (HOSPITAL_COMMUNITY): Payer: Self-pay

## 2023-11-09 ENCOUNTER — Ambulatory Visit (INDEPENDENT_AMBULATORY_CARE_PROVIDER_SITE_OTHER): Admitting: Primary Care

## 2023-11-09 ENCOUNTER — Encounter: Payer: Self-pay | Admitting: Primary Care

## 2023-11-09 ENCOUNTER — Telehealth: Payer: Self-pay

## 2023-11-09 VITALS — BP 143/78 | HR 70 | Temp 97.6°F | Ht 69.0 in | Wt 189.2 lb

## 2023-11-09 DIAGNOSIS — G4733 Obstructive sleep apnea (adult) (pediatric): Secondary | ICD-10-CM | POA: Diagnosis not present

## 2023-11-09 NOTE — Patient Instructions (Addendum)
  VISIT SUMMARY: Raheen Capili, a 63 year old male with a history of sleep apnea, came in for a sleep consultation. He previously used CPAP therapy but stopped two years ago due to discomfort. He currently has no symptoms of gasping, choking, or excessive daytime sleepiness. His sleep schedule is regular, and he does not experience daytime sleepiness.  YOUR PLAN: -OBSTRUCTIVE SLEEP APNEA: Obstructive sleep apnea is a condition where the airway becomes blocked during sleep, causing breathing pauses. You previously used CPAP therapy but stopped due to discomfort. We discussed the risks of untreated sleep apnea, such as heart problems, stroke, diabetes, and high blood pressure. You prefer to resume CPAP therapy over surgical options. We will order a repeat sleep study to reassess the severity of your sleep apnea and determine if CPAP therapy is needed. If your sleep apnea is mild and you have no symptoms, we will provide information on lifestyle changes like sleeping on your side, avoiding alcohol before bed, and weight loss.  INSTRUCTIONS: We will schedule a repeat IN-LAB sleep study to reassess the severity of your sleep apnea. Based on the results, we will determine if you need to resume CPAP therapy. If your sleep apnea is mild and asymptomatic, we will discuss conservative management options.

## 2023-11-09 NOTE — Telephone Encounter (Signed)
 Spoke with patient to discuss upcoming procedure.   TEE same day of procedure d/t creatinine level. Labs: completed.   Any recent signs of acute illness or been started on antibiotics? No Any new medications started? No Any medications to hold? No Any missed doses of blood thinner? No  Advised patient to continue taking ANTICOAGULANT: Eliquis  (Apixaban ) twice daily without missing any doses.  Medication instructions:  On the morning of your procedure DO NOT take any medication., including Eliquis  or the procedure may be rescheduled. Nothing to eat or drink after midnight prior to your procedure.  Confirmed patient is scheduled for Atrial Fibrillation Ablation on Friday, August 15 with Dr. Ole Holts. Instructed patient to arrive at the Main Entrance A at [facility]: [address] [place], [place] [address] and check in at Admitting at 5:30 AM.  Advised of plan to go home the same day and will only stay overnight if medically necessary. You MUST have a responsible adult to drive you home and MUST be with you the first 24 hours after you arrive home or your procedure could be cancelled.  Patient verbalized understanding to all instructions provided and agreed to proceed with procedure.

## 2023-11-09 NOTE — Telephone Encounter (Signed)
 See above phone note. Patient is aware and procedure has been rescheduled.

## 2023-11-09 NOTE — Telephone Encounter (Signed)
 Noted patient has an US  guided kidney Bx scheduled for 8/13 for chronic proteinuria, prior to ablation on 8/15. Renal US  in July showed bilateral renal cysts. Inquired from patient if he was told to hold Eliquis  and he says he was advised to hold 5 days prior. Will forward to Dr. Cindie to make aware.
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
## 2023-11-09 NOTE — Telephone Encounter (Signed)
 Call to patient who is concerned about Kidney biopsy 8/13 and ablation 8/15. Patient states he doesn't know what to do with his eliquis , whether he should be on it or off it. Dr. Hiram note from 11/09/23 states:  Will need to reschedule ablation. Can't miss 5 days prior to ablation.   Call to patient to explain in this case he can proceed with kidney biopsy and holding eliquis . Patient verbalizes understanding and asks Carly or Dr. Cindie to call him back to confirm this is the case.

## 2023-11-09 NOTE — Telephone Encounter (Signed)
 Spoke with the patient and his wife and confirmed that his ablation will need to be rescheduled. He has been moved to 9/29.

## 2023-11-13 ENCOUNTER — Other Ambulatory Visit: Payer: Self-pay

## 2023-11-13 ENCOUNTER — Other Ambulatory Visit (HOSPITAL_COMMUNITY): Payer: Self-pay

## 2023-11-13 MED ORDER — MAGNESIUM OXIDE -MG SUPPLEMENT 400 (240 MG) MG PO TABS
400.0000 mg | ORAL_TABLET | Freq: Every day | ORAL | 3 refills | Status: AC
  Filled 2023-11-13 – 2023-11-20 (×4): qty 90, 90d supply, fill #0
  Filled 2024-02-14: qty 90, 90d supply, fill #1

## 2023-11-14 ENCOUNTER — Encounter: Payer: Self-pay | Admitting: Pharmacist

## 2023-11-14 ENCOUNTER — Other Ambulatory Visit (HOSPITAL_COMMUNITY): Payer: Self-pay

## 2023-11-14 ENCOUNTER — Other Ambulatory Visit: Payer: Self-pay

## 2023-11-14 LAB — LIPID PANEL
Chol/HDL Ratio: 2.4 ratio (ref 0.0–5.0)
Cholesterol, Total: 129 mg/dL (ref 100–199)
HDL: 54 mg/dL (ref >39)
LDL Chol Calc (NIH): 58 mg/dL (ref 0–99)
Triglycerides: 90 mg/dL (ref 0–149)
VLDL Cholesterol Cal: 17 mg/dL (ref 5–40)

## 2023-11-14 LAB — BASIC METABOLIC PANEL WITH GFR
BUN/Creatinine Ratio: 13 (ref 10–24)
BUN: 15 mg/dL (ref 8–27)
CO2: 17 mmol/L — ABNORMAL LOW (ref 20–29)
Calcium: 9.6 mg/dL (ref 8.6–10.2)
Chloride: 106 mmol/L (ref 96–106)
Creatinine, Ser: 1.19 mg/dL (ref 0.76–1.27)
Glucose: 116 mg/dL — ABNORMAL HIGH (ref 70–99)
Potassium: 4 mmol/L (ref 3.5–5.2)
Sodium: 141 mmol/L (ref 134–144)
eGFR: 69 mL/min/1.73 (ref >59)

## 2023-11-15 ENCOUNTER — Other Ambulatory Visit (HOSPITAL_COMMUNITY): Payer: Self-pay

## 2023-11-15 ENCOUNTER — Ambulatory Visit (HOSPITAL_COMMUNITY)

## 2023-11-15 ENCOUNTER — Other Ambulatory Visit: Payer: Self-pay

## 2023-11-20 ENCOUNTER — Other Ambulatory Visit (HOSPITAL_COMMUNITY): Payer: Self-pay

## 2023-11-20 ENCOUNTER — Other Ambulatory Visit: Payer: Self-pay

## 2023-11-20 NOTE — Telephone Encounter (Signed)
 Called patient and results given. Made pateint aware to start Magnesium  oxide 400 mg daily and have lab in one week. [facility] and per pharmacy medication will be mailed out tomorrow 8/19. Patient made aware and verbalized an understanding.

## 2023-11-21 ENCOUNTER — Other Ambulatory Visit (HOSPITAL_COMMUNITY): Payer: Self-pay

## 2023-11-21 ENCOUNTER — Other Ambulatory Visit: Payer: Self-pay

## 2023-11-21 ENCOUNTER — Encounter: Payer: Self-pay | Admitting: Pharmacist

## 2023-11-21 ENCOUNTER — Ambulatory Visit (HOSPITAL_COMMUNITY)
Admission: RE | Admit: 2023-11-21 | Discharge: 2023-11-21 | Disposition: A | Discharge status: Home / Self Care | Source: Ambulatory Visit | Attending: Cardiology | Admitting: Cardiology

## 2023-11-21 DIAGNOSIS — I739 Peripheral vascular disease, unspecified: Secondary | ICD-10-CM | POA: Insufficient documentation

## 2023-11-22 LAB — VAS US ABI WITH/WO TBI
Left ABI: 1
Right ABI: 1.02

## 2023-11-24 ENCOUNTER — Other Ambulatory Visit (HOSPITAL_COMMUNITY): Payer: Self-pay

## 2023-11-24 ENCOUNTER — Other Ambulatory Visit: Payer: Self-pay | Admitting: Critical Care Medicine

## 2023-11-24 ENCOUNTER — Other Ambulatory Visit: Payer: Self-pay | Admitting: Family Medicine

## 2023-11-27 ENCOUNTER — Other Ambulatory Visit (HOSPITAL_COMMUNITY): Payer: Self-pay

## 2023-12-02 LAB — BASIC METABOLIC PANEL WITH GFR
BUN/Creatinine Ratio: 20 (ref 10–24)
BUN: 38 mg/dL — ABNORMAL HIGH (ref 8–27)
CO2: 17 mmol/L — ABNORMAL LOW (ref 20–29)
Calcium: 10.1 mg/dL (ref 8.6–10.2)
Chloride: 107 mmol/L — ABNORMAL HIGH (ref 96–106)
Creatinine, Ser: 1.87 mg/dL — ABNORMAL HIGH (ref 0.76–1.27)
Glucose: 101 mg/dL — ABNORMAL HIGH (ref 70–99)
Potassium: 4.2 mmol/L (ref 3.5–5.2)
Sodium: 142 mmol/L (ref 134–144)
eGFR: 40 mL/min/1.73 — ABNORMAL LOW (ref >59)

## 2023-12-02 LAB — MAGNESIUM: Magnesium: 1.7 mg/dL (ref 1.6–2.3)

## 2023-12-03 ENCOUNTER — Ambulatory Visit: Payer: Self-pay | Admitting: Cardiology

## 2023-12-03 DIAGNOSIS — I10 Essential (primary) hypertension: Secondary | ICD-10-CM

## 2023-12-04 ENCOUNTER — Other Ambulatory Visit: Payer: Self-pay

## 2023-12-04 DIAGNOSIS — I4819 Other persistent atrial fibrillation: Secondary | ICD-10-CM

## 2023-12-06 ENCOUNTER — Other Ambulatory Visit (HOSPITAL_COMMUNITY): Payer: Self-pay | Admitting: Student

## 2023-12-06 ENCOUNTER — Other Ambulatory Visit: Payer: Self-pay | Admitting: Student

## 2023-12-06 DIAGNOSIS — N1831 Chronic kidney disease, stage 3a: Secondary | ICD-10-CM
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
## 2023-12-07 ENCOUNTER — Other Ambulatory Visit: Payer: Self-pay

## 2023-12-07 ENCOUNTER — Ambulatory Visit (HOSPITAL_COMMUNITY)
Admission: RE | Admit: 2023-12-07 | Discharge: 2023-12-07 | Disposition: A | Discharge status: Home / Self Care | Source: Ambulatory Visit | Attending: Nephrology | Admitting: Nephrology

## 2023-12-07 DIAGNOSIS — N1831 Chronic kidney disease, stage 3a: Secondary | ICD-10-CM | POA: Insufficient documentation

## 2023-12-07 DIAGNOSIS — R809 Proteinuria, unspecified: Secondary | ICD-10-CM | POA: Insufficient documentation

## 2023-12-07 DIAGNOSIS — E785 Hyperlipidemia, unspecified: Secondary | ICD-10-CM | POA: Insufficient documentation

## 2023-12-07 DIAGNOSIS — Z7901 Long term (current) use of anticoagulants: Secondary | ICD-10-CM | POA: Diagnosis not present

## 2023-12-07 DIAGNOSIS — I4891 Unspecified atrial fibrillation: Secondary | ICD-10-CM | POA: Diagnosis not present

## 2023-12-07 DIAGNOSIS — G4733 Obstructive sleep apnea (adult) (pediatric): Secondary | ICD-10-CM | POA: Insufficient documentation

## 2023-12-07 DIAGNOSIS — I130 Hypertensive heart and chronic kidney disease with heart failure and stage 1 through stage 4 chronic kidney disease, or unspecified chronic kidney disease: Secondary | ICD-10-CM | POA: Diagnosis not present

## 2023-12-07 LAB — PROTIME-INR
INR: 1.1 (ref 0.8–1.2)
Prothrombin Time: 15 s (ref 11.4–15.2)

## 2023-12-07 LAB — CBC
HCT: 38.4 % — ABNORMAL LOW (ref 39.0–52.0)
Hemoglobin: 12.6 g/dL — ABNORMAL LOW (ref 13.0–17.0)
MCH: 24 pg — ABNORMAL LOW (ref 26.0–34.0)
MCHC: 32.8 g/dL (ref 30.0–36.0)
MCV: 73 fL — ABNORMAL LOW (ref 80.0–100.0)
Platelets: 107 K/uL — ABNORMAL LOW (ref 150–400)
RBC: 5.26 MIL/uL (ref 4.22–5.81)
RDW: 14 % (ref 11.5–15.5)
WBC: 3.6 K/uL — ABNORMAL LOW (ref 4.0–10.5)
nRBC: 0 % (ref 0.0–0.2)

## 2023-12-07 MED ORDER — FENTANYL CITRATE (PF) 100 MCG/2ML IJ SOLN
INTRAMUSCULAR | Status: AC
Start: 2023-12-07 — End: 2023-12-07
  Filled 2023-12-07: qty 2

## 2023-12-07 MED ORDER — MIDAZOLAM HCL 2 MG/2ML IJ SOLN
INTRAMUSCULAR | Status: AC | PRN
  Administered 2023-12-07 (×2): .5 mg via INTRAVENOUS
  Administered 2023-12-07: 1 mg via INTRAVENOUS

## 2023-12-07 MED ORDER — FENTANYL CITRATE (PF) 100 MCG/2ML IJ SOLN
INTRAMUSCULAR | Status: AC | PRN
  Administered 2023-12-07 (×2): 25 ug via INTRAVENOUS
  Administered 2023-12-07: 50 ug via INTRAVENOUS

## 2023-12-07 MED ORDER — MIDAZOLAM HCL 2 MG/2ML IJ SOLN
INTRAMUSCULAR | Status: AC
  Filled 2023-12-07: qty 2

## 2023-12-07 MED ORDER — LIDOCAINE HCL (PF) 1 % IJ SOLN
15.0000 mL | Freq: Once | INTRAMUSCULAR | Status: AC
Start: 2023-12-07 — End: 2023-12-07
  Administered 2023-12-07: 15 mL

## 2023-12-07 MED ORDER — SODIUM CHLORIDE 0.9 % IV SOLN: INTRAVENOUS | Status: DC

## 2023-12-07 NOTE — Procedures (Signed)
 Vascular and Interventional Radiology Procedure Note  Patient: [name] DOB: 08/25/1960 Medical Record Number: [id number] Note Date/Time: 12/07/23 9:44 AM   Performing Physician: Thom Hall, MD Assistant(s): None  Diagnosis: Proteinuria   Procedure: RENAL BIOPSY, NON-TARGETED  Anesthesia: Conscious Sedation Complications: None Estimated Blood Loss: Minimal Specimens: Sent for Pathology  Findings:  Successful Ultrasound-guided biopsy of kidney. A total of 3 samples were obtained. Hemostasis of the tract was achieved using Manual Pressure.  Plan: Bed rest for 2 hours.  See detailed procedure note with images in PACS. The patient tolerated the procedure well without incident or complication and was returned to Recovery in stable condition.    Thom Hall, MD Vascular and Interventional Radiology [facility] Radiology   Pager. [phone] Clinic. [phone]

## 2023-12-12 ENCOUNTER — Encounter (HOSPITAL_COMMUNITY): Payer: Self-pay

## 2023-12-12 LAB — SURGICAL PATHOLOGY

## 2023-12-14 LAB — BASIC METABOLIC PANEL WITH GFR
BUN/Creatinine Ratio: 24 (ref 10–24)
BUN: 42 mg/dL — AB (ref 8–27)
CO2: 18 mmol/L — AB (ref 20–29)
Calcium: 9 mg/dL (ref 8.6–10.2)
Chloride: 106 mmol/L (ref 96–106)
Creatinine, Ser: 1.73 mg/dL — AB (ref 0.76–1.27)
Glucose: 90 mg/dL (ref 70–99)
Potassium: 4.2 mmol/L (ref 3.5–5.2)
Sodium: 140 mmol/L (ref 134–144)
eGFR: 44 mL/min/1.73 — AB (ref >59)

## 2023-12-14 LAB — CBC
Hematocrit: 42.1 % (ref 37.5–51.0)
Hemoglobin: 12.8 g/dL — ABNORMAL LOW (ref 13.0–17.7)
MCH: 23.7 pg — ABNORMAL LOW (ref 26.6–33.0)
MCHC: 30.4 g/dL — ABNORMAL LOW (ref 31.5–35.7)
MCV: 78 fL — ABNORMAL LOW (ref 79–97)
Platelets: 139 x10E3/uL — ABNORMAL LOW (ref 150–450)
RBC: 5.4 x10E6/uL (ref 4.14–5.80)
RDW: 14.7 % (ref 11.6–15.4)
WBC: 3.9 x10E3/uL (ref 3.4–10.8)

## 2023-12-18 ENCOUNTER — Other Ambulatory Visit: Payer: Self-pay | Admitting: Family Medicine

## 2023-12-18 ENCOUNTER — Other Ambulatory Visit (HOSPITAL_COMMUNITY): Payer: Self-pay

## 2023-12-19 ENCOUNTER — Other Ambulatory Visit: Payer: Self-pay

## 2023-12-19 ENCOUNTER — Other Ambulatory Visit (HOSPITAL_COMMUNITY): Payer: Self-pay

## 2023-12-19 MED ORDER — CARVEDILOL 25 MG PO TABS
25.0000 mg | ORAL_TABLET | Freq: Two times a day (BID) | ORAL | 0 refills | Status: DC
  Filled 2023-12-19 (×2): qty 180, 90d supply, fill #0

## 2023-12-19 NOTE — Telephone Encounter (Signed)
 Requested Prescriptions  Pending Prescriptions Disp Refills   carvedilol  (COREG ) 25 MG tablet 180 tablet 0    Sig: Take 1 tablet (25 mg total) by mouth 2 (two) times daily with a meal.     Cardiovascular: Beta Blockers 3 Failed - 12/19/2023  1:36 PM      Failed - Cr in normal range and within 360 days    Creat  Date Value Ref Range Status  02/16/2022 1.75 (H) 0.70 - 1.35 mg/dL Final   Creatinine, Ser  Date Value Ref Range Status  12/13/2023 1.73 (H) 0.76 - 1.27 mg/dL Final         Failed - Last BP in normal range    BP Readings from Last 1 Encounters:  12/07/23 (!) 163/92         Passed - AST in normal range and within 360 days    AST  Date Value Ref Range Status  09/15/2023 21 0 - 40 IU/L Final         Passed - ALT in normal range and within 360 days    ALT  Date Value Ref Range Status  09/15/2023 13 0 - 44 IU/L Final         Passed - Last Heart Rate in normal range    Pulse Readings from Last 1 Encounters:  12/07/23 71         Passed - Valid encounter within last 6 months    Recent Outpatient Visits           2 months ago Primary hypertension   [facility] Of [place]. [facility] Fleeta Morris, Garnette CROME, RPH-CPP   3 months ago Essential hypertension   [place] [facility] [place] - A Dept Of [place]. [facility] Vicci Sober B, MD   3 months ago Acute on chronic diastolic (congestive) heart failure [facility])   [facility] [facility] Dept Of [place]. [facility] Fleeta Morris, Garnette CROME, RPH-CPP   4 months ago Primary hypertension   [facility] - [facility] Dept Of [place]. [facility] Fleeta Morris Garnette CROME, RPH-CPP   5 months ago Hyperkalemia   [facility] [place] - A Dept Of [place]. [facility] Brien Belvie BRAVO, MD       Future Appointments             In 1 month Vicci Sober NOVAK, MD [facility] [place] - A Dept Of [place]. [facility] [place]   In 2 months Kate, Lonni CROME, MD [facility] A Dept of [facility] [place]. [facility] [employer], H&V

## 2023-12-20 ENCOUNTER — Other Ambulatory Visit (HOSPITAL_COMMUNITY): Payer: Self-pay

## 2023-12-29 NOTE — Pre-Procedure Instructions (Signed)
 Attempted to call patient regarding procedure instructions.  Left voicemail on the following items: Arrival time 0515 Nothing to eat or drink after midnight No meds AM of procedure Responsible person to drive you home and stay with you for 24 hrs  Have you missed any doses of anti-coagulant Eliquis- should be taken twice a day, if you have missed any doses please let us know.  Don't take dose morning of procedure.

## 2023-12-31 NOTE — Anesthesia Preprocedure Evaluation (Addendum)
 Anesthesia Evaluation  Patient identified by MRN, date of birth, ID band Patient awake    Reviewed: Allergy & Precautions, NPO status , Patient's Chart, lab work & pertinent test results, reviewed documented beta blocker date and time   Airway Mallampati: II  TM Distance: >3 FB     Dental  (+) Edentulous Upper, Edentulous Lower   Pulmonary sleep apnea    Pulmonary exam normal breath sounds clear to auscultation       Cardiovascular hypertension, Pt. on medications and Pt. on home beta blockers + angina  + CAD, + Peripheral Vascular Disease and +CHF  Normal cardiovascular exam+ dysrhythmias Atrial Fibrillation (-) pacemaker+ Valvular Problems/Murmurs AS  Rhythm:Regular Rate:Normal  S/P TAVR 2024 for severe AS  Echo 07/05/23  1. Left ventricular ejection fraction, by estimation, is 60 to 65%. The  left ventricle has normal function. The left ventricle has no regional  wall motion abnormalities. There is severe left ventricular hypertrophy.  Left ventricular diastolic parameters   are consistent with Grade I diastolic dysfunction (impaired relaxation).   2. Right ventricular systolic function is normal. The right ventricular  size is normal.   3. Left atrial size was severely dilated.   4. Right atrial size was mildly dilated.   5. The mitral valve is abnormal. Mild mitral valve regurgitation. No  evidence of mitral stenosis.   6. Mild perivavlular leak at 2 o'clock.. The aortic valve is normal in  structure. Aortic valve regurgitation is mild. No aortic stenosis is  present. There is a 29 mm Edwards Sapien prosthetic (TAVR) valve present  in the aortic position. Procedure Date:  06/09/2022. Aortic regurgitation PHT measures 366 msec. Aortic valve area,  by VTI measures 2.78 cm. Aortic valve mean gradient measures 11.7 mmHg.  Aortic valve Vmax measures 2.34 m/s.   7. Aortic dilatation noted. There is mild dilatation of the ascending   aorta, measuring 40 mm.   8. The inferior vena cava is normal in size with greater than 50%  respiratory variability, suggesting right atrial pressure of 3 mmHg.    EKG 11/07/23 NSR, LVH, Anteroseptal MI LAE, LAD   Neuro/Psych  Headaches  negative psych ROS   GI/Hepatic Neg liver ROS,GERD  Medicated,,  Endo/Other  Hyperlipidemia  Renal/GU Renal InsufficiencyRenal disease  negative genitourinary   Musculoskeletal  (+) Arthritis , Osteoarthritis,  Chronic LBP   Abdominal   Peds  Hematology  (+) Blood dyscrasia, anemia Thrombocytopenia Plt 139k Eliquis  therapy- last dose  Lab Results      Component                Value               Date                      WBC                      3.9                 12/13/2023                HGB                      12.8 (L)            12/13/2023                HCT  42.1                12/13/2023                MCV                      78 (L)              12/13/2023                PLT                      139 (L)             12/13/2023              Anesthesia Other Findings   Reproductive/Obstetrics                              Anesthesia Physical Anesthesia Plan  ASA: 3  Anesthesia Plan: General   Post-op Pain Management: Minimal or no pain anticipated   Induction: Intravenous  PONV Risk Score and Plan: 3 and Treatment may vary due to age or medical condition, Ondansetron  and Dexamethasone   Airway Management Planned: Oral ETT  Additional Equipment: None  Intra-op Plan:   Post-operative Plan: Extubation in OR  Informed Consent: I have reviewed the patients History and Physical, chart, labs and discussed the procedure including the risks, benefits and alternatives for the proposed anesthesia with the patient or authorized representative who has indicated his/her understanding and acceptance.     Dental advisory given  Plan Discussed with: CRNA and  Anesthesiologist  Anesthesia Plan Comments:          Anesthesia Quick Evaluation

## 2024-01-01 ENCOUNTER — Other Ambulatory Visit (HOSPITAL_COMMUNITY): Payer: Self-pay

## 2024-01-01 ENCOUNTER — Ambulatory Visit (HOSPITAL_BASED_OUTPATIENT_CLINIC_OR_DEPARTMENT_OTHER): Admitting: Anesthesiology

## 2024-01-01 ENCOUNTER — Other Ambulatory Visit: Payer: Self-pay

## 2024-01-01 ENCOUNTER — Ambulatory Visit (HOSPITAL_COMMUNITY)
Admission: RE | Admit: 2024-01-01 | Discharge: 2024-01-01 | Disposition: A | Discharge status: Home / Self Care | Attending: Cardiology | Admitting: Cardiology

## 2024-01-01 ENCOUNTER — Ambulatory Visit (HOSPITAL_COMMUNITY): Admitting: Anesthesiology

## 2024-01-01 ENCOUNTER — Ambulatory Visit (HOSPITAL_COMMUNITY)
Admission: RE | Disposition: A | Discharge status: Home / Self Care | Payer: Self-pay | Source: Home / Self Care | Attending: Cardiology

## 2024-01-01 ENCOUNTER — Encounter (HOSPITAL_BASED_OUTPATIENT_CLINIC_OR_DEPARTMENT_OTHER): Admitting: Internal Medicine

## 2024-01-01 DIAGNOSIS — E785 Hyperlipidemia, unspecified: Secondary | ICD-10-CM | POA: Insufficient documentation

## 2024-01-01 DIAGNOSIS — I2510 Atherosclerotic heart disease of native coronary artery without angina pectoris: Secondary | ICD-10-CM | POA: Diagnosis not present

## 2024-01-01 DIAGNOSIS — Z79899 Other long term (current) drug therapy: Secondary | ICD-10-CM | POA: Diagnosis not present

## 2024-01-01 DIAGNOSIS — I4819 Other persistent atrial fibrillation: Secondary | ICD-10-CM | POA: Diagnosis present

## 2024-01-01 DIAGNOSIS — Z7901 Long term (current) use of anticoagulants: Secondary | ICD-10-CM | POA: Insufficient documentation

## 2024-01-01 DIAGNOSIS — Z952 Presence of prosthetic heart valve: Secondary | ICD-10-CM | POA: Insufficient documentation

## 2024-01-01 DIAGNOSIS — I4891 Unspecified atrial fibrillation: Secondary | ICD-10-CM | POA: Diagnosis not present

## 2024-01-01 DIAGNOSIS — G4730 Sleep apnea, unspecified: Secondary | ICD-10-CM | POA: Diagnosis not present

## 2024-01-01 DIAGNOSIS — I5022 Chronic systolic (congestive) heart failure: Secondary | ICD-10-CM | POA: Diagnosis not present

## 2024-01-01 DIAGNOSIS — K219 Gastro-esophageal reflux disease without esophagitis: Secondary | ICD-10-CM | POA: Diagnosis not present

## 2024-01-01 DIAGNOSIS — N189 Chronic kidney disease, unspecified: Secondary | ICD-10-CM | POA: Diagnosis not present

## 2024-01-01 DIAGNOSIS — I739 Peripheral vascular disease, unspecified: Secondary | ICD-10-CM | POA: Diagnosis not present

## 2024-01-01 DIAGNOSIS — I350 Nonrheumatic aortic (valve) stenosis: Secondary | ICD-10-CM | POA: Diagnosis not present

## 2024-01-01 DIAGNOSIS — I480 Paroxysmal atrial fibrillation: Secondary | ICD-10-CM

## 2024-01-01 DIAGNOSIS — I130 Hypertensive heart and chronic kidney disease with heart failure and stage 1 through stage 4 chronic kidney disease, or unspecified chronic kidney disease: Secondary | ICD-10-CM | POA: Diagnosis not present

## 2024-01-01 HISTORY — PX: ATRIAL FIBRILLATION ABLATION: EP1191

## 2024-01-01 LAB — POCT ACTIVATED CLOTTING TIME: Activated Clotting Time: 325 s

## 2024-01-01 MED ORDER — PANTOPRAZOLE SODIUM 40 MG PO TBEC
40.0000 mg | DELAYED_RELEASE_TABLET | Freq: Every day | ORAL | 0 refills | Status: DC
  Filled 2024-01-01: qty 45, 45d supply, fill #0

## 2024-01-01 MED ORDER — MIDAZOLAM HCL 2 MG/2ML IJ SOLN
INTRAMUSCULAR | Status: DC | PRN
  Administered 2024-01-01: 2 mg via INTRAVENOUS

## 2024-01-01 MED ORDER — SODIUM CHLORIDE 0.9 % IV SOLN: INTRAVENOUS | Status: DC

## 2024-01-01 MED ORDER — LIDOCAINE 2% (20 MG/ML) 5 ML SYRINGE
INTRAMUSCULAR | Status: DC | PRN
  Administered 2024-01-01: 80 mg via INTRAVENOUS

## 2024-01-01 MED ORDER — ATROPINE SULFATE 1 MG/10ML IJ SOSY
PREFILLED_SYRINGE | INTRAMUSCULAR | Status: AC
  Filled 2024-01-01: qty 10

## 2024-01-01 MED ORDER — ONDANSETRON HCL 4 MG/2ML IJ SOLN
INTRAMUSCULAR | Status: DC | PRN
  Administered 2024-01-01: 4 mg via INTRAVENOUS

## 2024-01-01 MED ORDER — COLCHICINE 0.6 MG PO TABS
0.6000 mg | ORAL_TABLET | Freq: Two times a day (BID) | ORAL | Status: DC
  Administered 2024-01-01: 0.6 mg via ORAL
  Filled 2024-01-01: qty 1

## 2024-01-01 MED ORDER — ONDANSETRON HCL 4 MG/2ML IJ SOLN: 4.0000 mg | Freq: Once | INTRAMUSCULAR | Status: DC | PRN

## 2024-01-01 MED ORDER — FENTANYL CITRATE (PF) 100 MCG/2ML IJ SOLN: 25.0000 ug | INTRAMUSCULAR | Status: DC | PRN

## 2024-01-01 MED ORDER — PANTOPRAZOLE SODIUM 40 MG PO TBEC
40.0000 mg | DELAYED_RELEASE_TABLET | Freq: Every day | ORAL | Status: DC
  Administered 2024-01-01: 40 mg via ORAL
  Filled 2024-01-01: qty 1

## 2024-01-01 MED ORDER — SUGAMMADEX SODIUM 200 MG/2ML IV SOLN
INTRAVENOUS | Status: DC | PRN
  Administered 2024-01-01: 160.6 mg via INTRAVENOUS

## 2024-01-01 MED ORDER — SODIUM CHLORIDE 0.9% FLUSH: 3.0000 mL | INTRAVENOUS | Status: DC | PRN

## 2024-01-01 MED ORDER — DEXAMETHASONE SODIUM PHOSPHATE 10 MG/ML IJ SOLN
INTRAMUSCULAR | Status: DC | PRN
  Administered 2024-01-01: 5 mg via INTRAVENOUS

## 2024-01-01 MED ORDER — PROTAMINE SULFATE 10 MG/ML IV SOLN
INTRAVENOUS | Status: DC | PRN
  Administered 2024-01-01: 35 mg via INTRAVENOUS

## 2024-01-01 MED ORDER — HEPARIN SODIUM (PORCINE) 1000 UNIT/ML IJ SOLN
INTRAMUSCULAR | Status: DC | PRN
  Administered 2024-01-01: 2000 [IU] via INTRAVENOUS
  Administered 2024-01-01: 13000 [IU] via INTRAVENOUS

## 2024-01-01 MED ORDER — ATROPINE SULFATE 1 MG/ML IV SOLN
INTRAVENOUS | Status: DC | PRN
  Administered 2024-01-01: 1 mg via INTRAVENOUS

## 2024-01-01 MED ORDER — SODIUM CHLORIDE 0.9% FLUSH: 3.0000 mL | Freq: Two times a day (BID) | INTRAVENOUS | Status: DC

## 2024-01-01 MED ORDER — ONDANSETRON HCL 4 MG/2ML IJ SOLN: 4.0000 mg | Freq: Four times a day (QID) | INTRAMUSCULAR | Status: DC | PRN

## 2024-01-01 MED ORDER — SODIUM CHLORIDE 0.9 % IV SOLN: 250.0000 mL | INTRAVENOUS | Status: DC | PRN

## 2024-01-01 MED ORDER — ROCURONIUM BROMIDE 10 MG/ML (PF) SYRINGE
PREFILLED_SYRINGE | INTRAVENOUS | Status: DC | PRN
  Administered 2024-01-01: 70 mg via INTRAVENOUS

## 2024-01-01 MED ORDER — FENTANYL CITRATE (PF) 250 MCG/5ML IJ SOLN
INTRAMUSCULAR | Status: DC | PRN
  Administered 2024-01-01: 100 ug via INTRAVENOUS

## 2024-01-01 MED ORDER — HEPARIN (PORCINE) IN NACL 1000-0.9 UT/500ML-% IV SOLN
INTRAVENOUS | Status: DC | PRN
  Administered 2024-01-01 (×3): 500 mL

## 2024-01-01 MED ORDER — PHENYLEPHRINE 80 MCG/ML (10ML) SYRINGE FOR IV PUSH (FOR BLOOD PRESSURE SUPPORT)
PREFILLED_SYRINGE | INTRAVENOUS | Status: DC | PRN
  Administered 2024-01-01: 80 ug via INTRAVENOUS
  Administered 2024-01-01: 160 ug via INTRAVENOUS

## 2024-01-01 MED ORDER — APIXABAN 5 MG PO TABS
5.0000 mg | ORAL_TABLET | Freq: Two times a day (BID) | ORAL | Status: DC
  Administered 2024-01-01: 5 mg via ORAL
  Filled 2024-01-01: qty 1

## 2024-01-01 MED ORDER — PROPOFOL 10 MG/ML IV BOLUS
INTRAVENOUS | Status: DC | PRN
  Administered 2024-01-01: 120 mg via INTRAVENOUS

## 2024-01-01 MED ORDER — PHENYLEPHRINE HCL-NACL 20-0.9 MG/250ML-% IV SOLN
INTRAVENOUS | Status: DC | PRN
  Administered 2024-01-01: 30 ug/min via INTRAVENOUS

## 2024-01-01 MED ORDER — ACETAMINOPHEN 325 MG PO TABS: 650.0000 mg | ORAL_TABLET | ORAL | Status: DC | PRN

## 2024-01-01 NOTE — Discharge Instructions (Signed)

## 2024-01-01 NOTE — Anesthesia Postprocedure Evaluation (Signed)
 Anesthesia Post Note  Patient: [name]  Procedure(s) Performed: ATRIAL FIBRILLATION ABLATION     Patient location during evaluation: PACU Anesthesia Type: General Level of consciousness: awake and alert and oriented Pain management: pain level controlled Vital Signs Assessment: post-procedure vital signs reviewed and stable Respiratory status: spontaneous breathing, nonlabored ventilation and respiratory function stable Cardiovascular status: blood pressure returned to baseline and stable Postop Assessment: no apparent nausea or vomiting Anesthetic complications: no   There were no known notable events for this encounter.  Last Vitals:  Vitals:   01/01/24 0955 01/01/24 1000  BP: (!) 141/87 (!) 145/82  Pulse: 65 65  Resp: 17 19  Temp:  36.4 C  SpO2: 94% 92%    Last Pain:  Vitals:   01/01/24 1000  TempSrc: Axillary  PainSc:                  Caylynn Minchew A.

## 2024-01-01 NOTE — Anesthesia Procedure Notes (Signed)
 Procedure Name: Intubation Date/Time: 01/01/2024 7:54 AM  Performed by: Loreli Blima LABOR, CRNAPre-anesthesia Checklist: Patient identified, Emergency Drugs available, Suction available and Patient being monitored Patient Re-evaluated:Patient Re-evaluated prior to induction Oxygen  Delivery Method: Circle System Utilized Preoxygenation: Pre-oxygenation with 100% oxygen  Induction Type: IV induction Ventilation: Mask ventilation without difficulty Laryngoscope Size: Mac and 4 Grade View: Grade I Tube type: Oral Tube size: 7.5 mm Number of attempts: 1 Airway Equipment and Method: Stylet Placement Confirmation: ETT inserted through vocal cords under direct vision, positive ETCO2 and breath sounds checked- equal and bilateral Secured at: 22 cm Tube secured with: Tape Dental Injury: Teeth and Oropharynx as per pre-operative assessment

## 2024-01-01 NOTE — H&P (Signed)
 Electrophysiology Office Note:     Date:  01/01/2024    ID:  [name], DOB [birth year], MRN [id number]   [facility] Cardiologist:  Lonni LITTIE Nanas, MD  [facility] Electrophysiologist:  OLE ONEIDA HOLTS, MD    Referring MD: Nanas Lonni LITTIE*    Chief Complaint: Atrial fibrillation   History of Present Illness:     [name] is a 63 year old man who I am seeing today for an evaluation of atrial fibrillation at the request of Dr. Nanas.  The patient has a history of severe aortic stenosis post TAVR, chronic systolic heart failure with recovered ejection fraction, atrial fibrillation, CKD.  He also has coronary artery disease.   The patient is on amiodarone  100 mg by mouth once daily in addition to Eliquis  5 mg by mouth twice daily.  Given the patient's young age and use of amiodarone , he is being referred to discuss catheter ablation.   He is doing well today.  Reports preserved energy levels.  Takes his amiodarone  daily.  Confirms he takes Eliquis  twice daily without missed doses.  He is interested in avoiding long-term use of amiodarone  given the associated risks.  Presents for AF ablation. Procedure reviewed.      Objective Their past medical, social and family history was reviewed.     ROS:   Please see the history of present illness.    All other systems reviewed and are negative.   EKGs/Labs/Other Studies Reviewed:     The following studies were reviewed today:   July 05, 2023 echo EF 60-65 RV normal Dilated left and right atrium Mild MR TAVR with mild paravalvular leak at 2 o'clock position   July 21, 2023 EKG shows sinus rhythm.  LVH.   June 10, 2022 EKG shows coarse atrial fibrillation.  LVH.   June 05, 2022 EKG shows atrial fibrillation/flutter with rapid ventricular rate     EKG Interpretation Date/Time:                  Friday Sep 01 2023 07:54:39 EDT Ventricular Rate:         72 PR Interval:                 190 QRS Duration:              116 QT Interval:                 444 QTC Calculation:486 R Axis:                         94   Text Interpretation:Sinus rhythm with Premature atrial complexes Rightward axis Confirmed by HOLTS OLE [phone]) on 09/01/2023 7:59:02 AM      Physical Exam:     VS:  BP 169/91 (BP Location: Left Arm, Patient Position: Sitting, Cuff Size: Large)   Pulse 74   Ht 5' 9 (1.753 m)   Wt 183 lb (83 kg)   SpO2 98%   BMI 27.02 kg/m         Wt Readings from Last 3 Encounters:  09/01/23 183 lb (83 kg)  08/14/23 182 lb (82.6 kg)  07/20/23 177 lb 6.4 oz (80.5 kg)      GEN: no distress CARD: RRR, No MRG RESP: No IWOB. CTAB.         Assessment ASSESSMENT AND PLAN:     1. Primary hypertension   2. Persistent atrial fibrillation (HCC)   3. Encounter for  long-term (current) use of high-risk medication       #Persistent atrial fibrillation #High risk med monitoring-amiodarone  Maintaining sinus rhythm on amiodarone .  Given his young age, this is not an ideal long-term solution.  His abnormal kidney function makes his sotalol and Tikosyn not ideal agents.  He has coronary artery disease and structural heart disease which eliminates the other antiarrhythmic options.  I have discussed catheter ablation as a mechanism to achieve rhythm control and avoid long-term exposure to amiodarone .  I discussed the procedural details including the risks, recovery and likelihood of success and he wishes to proceed..   Discussed treatment options today for AF including antiarrhythmic drug therapy and ablation. Discussed risks, recovery and likelihood of success with each treatment strategy. Risk, benefits, and alternatives to EP study and ablation for afib were discussed. These risks include but are not limited to stroke, bleeding, vascular damage, tamponade, perforation, damage to the esophagus, lungs, phrenic nerve and other structures, pulmonary vein stenosis, worsening renal function, coronary  vasospasm and death.  Discussed potential need for repeat ablation procedures and antiarrhythmic drugs after an initial ablation. The patient understands these risk and wishes to proceed.  We will therefore proceed with catheter ablation at the next available time.  Carto, ICE, anesthesia are requested for the procedure.  Will also obtain CT PV protocol prior to the procedure to exclude LAA thrombus and further evaluate atrial anatomy.   Continue Eliquis  for stroke prophylaxis   Blood work in May 2025 shows stable thyroid  and liver function.   #Hypertension At goal today.  Recommend checking blood pressures 1-2 times per week at home and recording the values.  Recommend bringing these recordings to the primary care physician. Continue Entresto , BiDil , chlorthalidone , Coreg , amlodipine .  Follow-up with general cardiology.   Presents for AF ablation. Procedure reviewed.   Signed, Ole DASEN. Cindie, MD, [facility], [facility] 01/01/2024 Electrophysiology [facility]

## 2024-01-01 NOTE — Transfer of Care (Signed)
 Immediate Anesthesia Transfer of Care Note  Patient: [name]  Procedure(s) Performed: ATRIAL FIBRILLATION ABLATION  Patient Location: PACU  Anesthesia Type:General  Level of Consciousness: awake and alert   Airway & Oxygen  Therapy: Patient Spontanous Breathing and Patient connected to nasal cannula oxygen   Post-op Assessment: Report given to RN and Post -op Vital signs reviewed and stable  Post vital signs: Reviewed and stable  Last Vitals:  Vitals Value Taken Time  BP 147/95 01/01/24 09:30  Temp    Pulse 71 01/01/24 09:35  Resp 15 01/01/24 09:35  SpO2 94 % 01/01/24 09:35  Vitals shown include unfiled device data.  Last Pain:  Vitals:   01/01/24 0555  TempSrc: Oral  PainSc: 0-No pain         Complications: There were no known notable events for this encounter.

## 2024-01-01 NOTE — Progress Notes (Signed)
 Patient ambulated to the bathroom.Patient was able to void. No new area of blood noted to dressings. Changed left groin site dressing, clean, dry, and intact. No hematoma or bleeding noted to groin sites. Spoke with the patient's wife, [name]  and gave discharge instructions on the phone.

## 2024-01-02 ENCOUNTER — Encounter (HOSPITAL_COMMUNITY): Payer: Self-pay | Admitting: Cardiology

## 2024-01-02 ENCOUNTER — Telehealth (HOSPITAL_COMMUNITY): Payer: Self-pay

## 2024-01-02 MED FILL — Atropine Sulfate Soln Prefill Syr 1 MG/10ML (0.1 MG/ML): INTRAMUSCULAR | Qty: 1 | Status: AC

## 2024-01-02 NOTE — Telephone Encounter (Signed)
 Spoke with patient to complete post procedure follow up call.  Patient reports no complications with groin sites.   Instructions reviewed with patient:  Remove large bandage at puncture site after 24 hours. It is normal to have bruising, tenderness, mild swelling, and a pea or marble sized lump/knot at the groin site which can take up to three months to resolve.  Get help right away if you notice sudden swelling at the puncture site.  Check your puncture site every day for signs of infection: fever, redness, swelling, pus drainage, warmth, foul odor or excessive pain. If this occurs, please call [phone], to speak with the RN Navigator. Get help right away if your puncture site is bleeding and the bleeding does not stop after applying firm pressure to the area.  You may continue to have skipped beats/ atrial fibrillation during the first several months after your procedure.  It is very important not to miss any doses of your blood thinner Eliquis .    You will follow up with the Afib clinic on 01/29/24 and follow up with the Afib clinic on 04/01/24.    Patient verbalized understanding to all instructions provided.

## 2024-01-09 ENCOUNTER — Encounter: Payer: Self-pay | Admitting: Internal Medicine

## 2024-01-09 ENCOUNTER — Encounter: Payer: Self-pay | Admitting: Cardiology

## 2024-01-10 ENCOUNTER — Telehealth: Payer: Self-pay

## 2024-01-10 NOTE — Telephone Encounter (Signed)
 Call to patient. He reports he had an ablation 01/01/24 but is having fluttering symptoms in his chest. He reports HR is 88 and BP is 127/83. He endorses taking his coreg  and amiodarone  as ordered.  He has f/u with afib clinic on 01/29/24 but would like to be seen sooner.  He is also complaining of 2 episodes of coughing over the last week. He states he has had this before when he has been on amlodipine , which he restarted a few months ago. He also endorses that he has had a family member who has had upper respiratory symptoms recently. However, these 2 coughing episodes were 3 days apart and resolved on their own. Forwarded to afib clinic for advice.

## 2024-01-12 ENCOUNTER — Encounter (HOSPITAL_COMMUNITY): Payer: Self-pay | Admitting: Internal Medicine

## 2024-01-12 ENCOUNTER — Ambulatory Visit (HOSPITAL_COMMUNITY)
Admission: RE | Admit: 2024-01-12 | Discharge: 2024-01-12 | Disposition: A | Discharge status: Home / Self Care | Source: Ambulatory Visit | Attending: Internal Medicine | Admitting: Internal Medicine

## 2024-01-12 VITALS — BP 134/88 | HR 71 | Ht 69.0 in | Wt 198.0 lb

## 2024-01-12 DIAGNOSIS — Z79899 Other long term (current) drug therapy: Secondary | ICD-10-CM

## 2024-01-12 DIAGNOSIS — D6869 Other thrombophilia: Secondary | ICD-10-CM | POA: Diagnosis not present

## 2024-01-12 DIAGNOSIS — Z5181 Encounter for therapeutic drug level monitoring: Secondary | ICD-10-CM

## 2024-01-12 DIAGNOSIS — I4819 Other persistent atrial fibrillation: Secondary | ICD-10-CM
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
## 2024-01-15 NOTE — Addendum Note (Signed)
 Addended by: TRUDY MIU R on: 01/15/2024 11:36 AM   Modules accepted: Orders

## 2024-01-16 ENCOUNTER — Ambulatory Visit (HOSPITAL_COMMUNITY)
Admission: RE | Admit: 2024-01-16 | Discharge: 2024-01-16 | Disposition: A | Discharge status: Home / Self Care | Source: Ambulatory Visit | Attending: Cardiology | Admitting: Cardiology

## 2024-01-16 DIAGNOSIS — I771 Stricture of artery: Secondary | ICD-10-CM | POA: Insufficient documentation

## 2024-01-19 ENCOUNTER — Encounter: Payer: Self-pay | Admitting: Internal Medicine

## 2024-01-19 ENCOUNTER — Other Ambulatory Visit: Payer: Self-pay

## 2024-01-19 ENCOUNTER — Ambulatory Visit: Payer: Self-pay | Attending: Internal Medicine | Admitting: Internal Medicine

## 2024-01-19 ENCOUNTER — Other Ambulatory Visit (HOSPITAL_COMMUNITY): Payer: Self-pay

## 2024-01-19 VITALS — BP 130/76 | HR 67 | Temp 98.2°F | Ht 69.0 in | Wt 198.0 lb

## 2024-01-19 DIAGNOSIS — I130 Hypertensive heart and chronic kidney disease with heart failure and stage 1 through stage 4 chronic kidney disease, or unspecified chronic kidney disease: Secondary | ICD-10-CM | POA: Diagnosis not present

## 2024-01-19 DIAGNOSIS — L853 Xerosis cutis: Secondary | ICD-10-CM

## 2024-01-19 DIAGNOSIS — I10 Essential (primary) hypertension: Secondary | ICD-10-CM

## 2024-01-19 DIAGNOSIS — N1832 Chronic kidney disease, stage 3b: Secondary | ICD-10-CM | POA: Diagnosis not present

## 2024-01-19 DIAGNOSIS — D509 Iron deficiency anemia, unspecified: Secondary | ICD-10-CM

## 2024-01-19 DIAGNOSIS — L602 Onychogryphosis: Secondary | ICD-10-CM

## 2024-01-19 DIAGNOSIS — I5022 Chronic systolic (congestive) heart failure: Secondary | ICD-10-CM | POA: Diagnosis not present

## 2024-01-19 DIAGNOSIS — N051 Unspecified nephritic syndrome with focal and segmental glomerular lesions: Secondary | ICD-10-CM

## 2024-01-19 DIAGNOSIS — J439 Emphysema, unspecified: Secondary | ICD-10-CM | POA: Insufficient documentation

## 2024-01-19 DIAGNOSIS — G4733 Obstructive sleep apnea (adult) (pediatric): Secondary | ICD-10-CM

## 2024-01-19 DIAGNOSIS — Z23 Encounter for immunization: Secondary | ICD-10-CM

## 2024-01-19 DIAGNOSIS — I480 Paroxysmal atrial fibrillation: Secondary | ICD-10-CM

## 2024-01-19 DIAGNOSIS — D696 Thrombocytopenia, unspecified: Secondary | ICD-10-CM

## 2024-01-19 MED ORDER — ZOSTER VAC RECOMB ADJUVANTED 50 MCG/0.5ML IM SUSR: 0.5000 mL | Freq: Once | INTRAMUSCULAR | 0 refills | Status: AC

## 2024-01-19 MED ORDER — AMIODARONE HCL 100 MG PO TABS
100.0000 mg | ORAL_TABLET | Freq: Every day | ORAL | 0 refills | Status: DC
  Filled 2024-01-19: qty 30, 30d supply, fill #0

## 2024-01-19 MED ORDER — CARVEDILOL 25 MG PO TABS
25.0000 mg | ORAL_TABLET | Freq: Two times a day (BID) | ORAL | 1 refills | Status: AC
  Filled 2024-01-19 – 2024-03-12 (×2): qty 180, 90d supply, fill #0

## 2024-01-19 MED ORDER — ZOSTER VAC RECOMB ADJUVANTED 50 MCG/0.5ML IM SUSR
0.5000 mL | Freq: Once | INTRAMUSCULAR | 0 refills | Status: AC
  Filled 2024-01-19: qty 1, 1d supply, fill #0

## 2024-01-19 MED ORDER — CHLORTHALIDONE 50 MG PO TABS
50.0000 mg | ORAL_TABLET | Freq: Every day | ORAL | 1 refills | Status: AC
  Filled 2024-01-19 – 2024-03-18 (×2): qty 90, 90d supply, fill #0
  Filled 2024-04-15: qty 90, 90d supply, fill #1

## 2024-01-19 MED FILL — Amlodipine Besylate Tab 5 MG (Base Equivalent): 5.0000 mg | ORAL | 90 days supply | Qty: 90 | Fill #0 | Status: CN

## 2024-01-19 NOTE — Patient Instructions (Signed)
  VISIT SUMMARY: Today, you had a follow-up visit to manage your chronic conditions, including hypertension, congestive heart failure, atrial fibrillation, and sleep apnea. We reviewed your recent ablation procedure, current medications, and overall health status. Your blood pressure was slightly elevated in the office, but your home readings are within the target range. We also discussed your kidney function, sleep apnea, anemia, toenail fungus, and dry skin. You received your flu shot and are due for a shingles vaccine today.  YOUR PLAN: -HYPERTENSION, CONGESTIVE HEART FAILURE, AND ATRIAL FIBRILLATION (STATUS POST ABLATION): Your heart rhythm has been normal since your ablation procedure, and you have not experienced any recent symptoms. Your blood pressure was slightly elevated today, but your home readings are within the target range. Continue taking Eliquis  and amiodarone  as prescribed by your cardiologist. Maintain your current blood pressure medications, including chlorthalidone , carvedilol , Entresto , Bidil , and furosemide  as needed, along with potassium. Keep limiting your salt intake.  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 We will contact your nephrologist for notes and biopsy results and continue to monitor your kidney function regularly.  -OBSTRUCTIVE SLEEP APNEA, UNDER EVALUATION: You have obstructive sleep apnea and previously did not tolerate CPAP. A sleep study is scheduled for January 24, 2024, to reassess your condition.  -ANEMIA, UNDER EVALUATION: You have mild anemia, which has shown recent improvement. We will order an iron level test to further evaluate the cause, which could be related to iron deficiency or your kidney condition.  -ONYCHOMYCOSIS (TOENAIL FUNGUS): You have thick, discolored toenails causing discomfort due to a fungal  infection. We will submit a referral to a podiatrist for further treatment.  -DRY SKIN OF LOWER EXTREMITIES: You have dry skin with itching and scabbing on your lower legs. Continue using Eucerin lotion and add Vaseline to help with the dryness.  -IMMUNIZATION: INFLUENZA AND SHINGLES VACCINES: You received your flu shot today and are due for a shingles vaccine. The shingles vaccine will be administered today, and you may experience some soreness and redness at the injection site.  INSTRUCTIONS: Please follow up with your nephrologist for ongoing kidney function monitoring. Attend your scheduled sleep study on January 24, 2024. We will order an iron level test to evaluate your anemia. A referral to a podiatrist will be made for your toenail fungus. Continue using Eucerin lotion and add Vaseline for your dry skin. You will receive your shingles vaccine today.                      Contains text generated by [employer]bridge.                                 Contains text generated by [employer]bridge.

## 2024-01-19 NOTE — Progress Notes (Addendum)
 Patient ID: [name], male    DOB: [birth year]  MRN: [id number]  CC: Hypertension (HTN f/u. Med refills. Layvonne referral to podiatry - toe nail fungus /Yes to shingles vax. Flu vax administered on 01/19/2024 - C.A.)   Subjective: [name] is a 63 y.o. male who presents for chronic ds management. 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  History of Present Illness [name] is a 63 year old male with hypertension, congestive heart failure, atrial fibrillation, and sleep apnea who presents for a four-month follow-up visit for chronic disease management.  A.fib: He underwent an ablation procedure on September 29th, which restored normal heart rhythm. He continues to take Eliquis  and amiodarone  as prescribed. He experienced heart fluttering on October 7th, which has since resolved. No recent episodes of heart racing, chest pain, or shortness of breath. He feels more energetic and less restless than before.  HTN/CHF/CAD: Blood pressure today was recorded at 149/86, slightly elevated from home readings, which typically range from 130/80 to 132/83. He takes blood pressure medications including chlorthalidone  50 mg daily, carvedilol  25 mg twice daily, Entresto  97/103 mg twice daily, Bidil  20/37.5 mg three times daily, furosemide  as needed, and potassium with furosemide . He limits salt intake. No chest pain or shortness of breath.  Regarding kidney function, he saw a nephrologist in July, where tests showed 3+ protein in the urine. GFR has been in the 40's . 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 He does not recall the nephrologist's .  OSA:  he previously did not  tolerate CPAP and on last visit, he reported that his significant other told him that he does not snore loud.  Saw pulmonary NP since last visit with me and she recommended doing sleep study. sleep study scheduled for October 22nd.   He has mild anemia, which has shown improvement with last H/H 12.8/42. MCV 78. Last 2 PLT count slightly low at 139 and 107th.   He has toenail fungus and overgrowth, causing discomfort, and seeks a referral to a podiatrist.   HM:He is due for a shingles vaccine today and has received his flu shot.     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   Chest pain 06/02/2022   Anemia 06/02/2022   Thrombocytopenia 06/02/2022   Hyponatremia 06/02/2022   Pulmonary nodules 06/02/2022   Syncope 03/24/2022   (HFpEF) heart failure with preserved ejection fraction (HCC) 03/24/2022   Hypomagnesemia 03/24/2022   Atrial fibrillation with RVR (HCC) 02/18/2022   CAD (coronary artery disease) 02/18/2022   SIRS (systemic inflammatory response syndrome) (HCC) 02/18/2022   Dizziness 02/18/2022   Urinary tract infection 02/18/2022   Abnormal chest x-[place] 02/18/2022   History of CVA (cerebrovascular accident) 02/18/2022   History of septic arthritis 02/18/2022   Severe sepsis (HCC) 01/15/2022   Obesity (BMI 30-39.9) 01/03/2022   Iron deficiency  anemia 01/03/2022   Gout of right knee 01/03/2022   Sepsis due to group A Streptococcus with acute renal failure without septic shock (HCC)    Acute renal failure superimposed on chronic kidney disease 01/01/2022   Magnesium  deficiency 11/23/2021   Atherosclerosis of artery of extremity  with ulceration (HCC) 09/20/2021   Open wound of lower extremity, right, initial encounter 09/20/2021   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/23/2021   Severe aortic stenosis    Persistent atrial fibrillation (HCC) 05/05/2021   Secondary hypercoagulable state 05/05/2021   Chronic back pain 11/05/2020   History of chronic skin ulcer 11/05/2020   OA (osteoarthritis) 11/05/2020   OSA (obstructive sleep apnea) 11/05/2020   Chronic kidney disease, stage 3a (HCC) 02/19/2020   Acute on chronic systolic CHF (congestive heart failure) (HCC) 07/25/2017   Pain and swelling of right knee    HTN (hypertension) 10/19/2011   Gout 10/19/2011   Hyperlipemia 10/19/2011   GERD (gastroesophageal reflux disease) 08/27/2011     Current Outpatient Medications on File Prior to Visit  Medication Sig Dispense Refill   acetaminophen  (TYLENOL ) 500 MG tablet Take 500 mg by mouth 3 (three) times daily as needed for moderate pain.     allopurinol  (ZYLOPRIM ) 100 MG tablet Take 1 tablet (100 mg total) by mouth daily. 90 tablet 1   apixaban  (ELIQUIS ) 5 MG TABS tablet Take 1 tablet (5 mg total) by mouth 2 (two) times daily. 180 tablet 3   aspirin  EC 81 MG tablet Take 81 mg by mouth daily. Swallow whole.     atorvastatin  (LIPITOR) 20 MG tablet Take 1 tablet (20 mg total) by mouth daily. 90 tablet 2   Blood Pressure Monitoring (BLOOD PRESSURE KIT) DEVI Use to measure blood pressure 1 each 0   furosemide  (LASIX ) 40 MG tablet Take 1 tablet (40 mg total) by mouth as needed for fluid. 30 tablet 3   isosorbide -hydrALAZINE  (BIDIL ) 20-37.5 MG tablet Take 2 tablets by mouth 3 (three) times daily. 540 tablet 1   magnesium  oxide (MAG-OX) 400 (240 Mg) MG tablet Take 1 tablet (400 mg total) by mouth daily. 90 tablet 3   omeprazole  (PRILOSEC) 40 MG capsule Take 1 capsule (40 mg total) by mouth daily. 90 capsule 3   sacubitril -valsartan  (ENTRESTO ) 97-103 MG Take 1 tablet by mouth 2 (two) times daily. 180 tablet 1   No current  facility-administered medications on file prior to visit.    Allergies  Allergen Reactions   Adhesive [Tape] Other (See Comments)    Makes the skin feel as if it is burning, will also bruise the skin. Pt. prefers paper tape   Latex Hives and Itching    Social History   Socioeconomic History   Marital status: Married    Spouse name: Not on file   Number of children: 3   Years of education: Not on file   Highest education level: Not on file  Occupational History   Occupation: Scientific laboratory technician, strenuous    Employer: [employer]   Occupation: Retired  Tobacco Use   Smoking status: Never   Smokeless tobacco: Never   Tobacco comments:    Never smoked 01/12/24  Vaping Use   Vaping status: Never Used  Substance and Sexual Activity   Alcohol use: No    Alcohol/week: 0.0 standard drinks of alcohol   Drug use: Not Currently    Types: Marijuana    Comment: 07/25/2017 nothing since ~ 2010   Sexual activity: Yes    Partners: Female  Birth control/protection: Condom  Other Topics Concern   Not on file  Social History Narrative   ** Merged History Encounter **       Social Drivers of Health   Financial Resource Strain: Low Risk  (09/19/2023)   Overall Financial Resource Strain (CARDIA)    Difficulty of Paying Living Expenses: Not hard at all  Food Insecurity: No Food Insecurity (09/19/2023)   Hunger Vital Sign    Worried About Running Out of Food in the Last Year: Never true    Ran Out of Food in the Last Year: Never true  Transportation Needs: No Transportation Needs (09/19/2023)   PRAPARE - Administrator, Civil Service (Medical): No    Lack of Transportation (Non-Medical): No  Physical Activity: Insufficiently Active (09/19/2023)   Exercise Vital Sign    Days of Exercise per Week: 2 days    Minutes of Exercise per Session: 30 min  Stress: No Stress Concern Present (09/19/2023)   [name] of Occupational Health - Occupational Stress Questionnaire     Feeling of Stress: Not at all  Social Connections: Moderately Integrated (09/19/2023)   Social Connection and Isolation Panel    Frequency of Communication with Friends and Family: Three times a week    Frequency of Social Gatherings with Friends and Family: Three times a week    Attends Religious Services: More than 4 times per year    Active Member of Clubs or Organizations: Yes    Attends Banker Meetings: More than 4 times per year    Marital Status: Divorced  Intimate Partner Violence: Not At Risk (09/19/2023)   Humiliation, Afraid, Rape, and Kick questionnaire    Fear of Current or Ex-Partner: No    Emotionally Abused: No    Physically Abused: No    Sexually Abused: No    Family History  Problem Relation Age of Onset   Kidney failure Mother    Heart attack Father    Asthma Daughter    Hypertension Other     Past Surgical History:  Procedure Laterality Date   ABDOMINAL AORTOGRAM W/LOWER EXTREMITY Left 10/22/2021   Procedure: ABDOMINAL AORTOGRAM W/LOWER EXTREMITY;  Surgeon: Magda Debby SAILOR, MD;  Location: [facility];  Service: Cardiovascular;  Laterality: Left;   ATRIAL FIBRILLATION ABLATION N/A 01/01/2024   Procedure: ATRIAL FIBRILLATION ABLATION;  Surgeon: Cindie Ole DASEN, MD;  Location: [facility];  Service: Cardiovascular;  Laterality: N/A;   CENTRAL LINE INSERTION  06/03/2022   Procedure: CENTRAL LINE INSERTION;  Surgeon: Cherrie Toribio SAUNDERS, MD;  Location: [facility];  Service: Cardiovascular;;   COLONOSCOPY  03/2011   INTRAOPERATIVE TRANSTHORACIC ECHOCARDIOGRAM N/A 06/09/2022   Procedure: INTRAOPERATIVE TRANSTHORACIC ECHOCARDIOGRAM;  Surgeon: Wonda Sharper, MD;  Location: [facility] INVASIVE CV LAB;  Service: Open Heart Surgery;  Laterality: N/A;   IRRIGATION AND DEBRIDEMENT KNEE Right 01/17/2022   Procedure: IRRIGATION AND DEBRIDEMENT KNEE;  Surgeon: Celena Sharper, MD;  Location: [facility] OR;  Service: Orthopedics;  Laterality: Right;   KNEE  ARTHROSCOPY Right 2004   w/ligament repair in kneecap   MULTIPLE TOOTH EXTRACTIONS  06/2010   full mouth   PERIPHERAL VASCULAR BALLOON ANGIOPLASTY Left 10/22/2021   Procedure: PERIPHERAL VASCULAR BALLOON ANGIOPLASTY;  Surgeon: Magda Debby SAILOR, MD;  Location: [facility];  Service: Cardiovascular;  Laterality: Left;  TP trunk/ Peroneal   RIGHT HEART CATH N/A 06/03/2022   Procedure: RIGHT HEART CATH;  Surgeon: Cherrie Toribio SAUNDERS, MD;  [facility]  INVAS[facility] CV [facility];  Service: Cardiovascular;  Laterality: N/A;   RIGHT/LEFT HEART CATH AND CORONARY ANGIOGRAPHY N/A 05/27/2021   Procedure: RIGHT/LEFT HEART CATH AND CORONARY ANGIOGRAPHY;  Surgeon: Verlin Lonni BIRCH, MD;  Location: [facility];  Service: Cardiovascular;  Laterality: N/A;   TEE WITHOUT CARDIOVERSION N/A 07/22/2015   Procedure: TRANSESOPHAGEAL ECHOCARDIOGRAM (TEE);  Surgeon: Maude JAYSON Emmer, MD;  Location: [facility];  Service: Cardiovascular;  Laterality: N/A;   TONSILLECTOMY         TRANSCATHETER AORTIC VALVE REPLACEMENT, TRANSFEMORAL N/A 06/09/2022   Procedure: Transcatheter Aortic Valve Replacement, Transfemoral;  Surgeon: Wonda Sharper, MD;  Location: [facility] INVASIVE CV LAB;  Service: Open Heart Surgery;  Laterality: N/A;   UPPER GASTROINTESTINAL ENDOSCOPY  03/2011    ROS: Review of Systems Negative except as stated above  PHYSICAL EXAM: BP 130/76   Pulse 67   Temp 98.2 F (36.8 C) (Oral)   Ht 5' 9 (1.753 m)   Wt 198 lb (89.8 kg)   SpO2 95%   BMI 29.24 kg/m   Physical Exam   General appearance - alert, well appearing, and in no distress Mental status - normal mood, behavior, speech, dress, motor activity, and thought processes Neck - supple, no significant adenopathy Chest - clear to auscultation, no wheezes, rales or rhonchi, symmetric air entry Heart - normal rate, regular rhythm, normal S1, S2, no murmurs, rubs, clicks or gallops Extremities - peripheral pulses normal, no pedal edema, no clubbing  or cyanosis Skin: Patient has very dry skin on legs and feet.     Latest Ref Rng & Units 12/13/2023   10:57 AM 12/01/2023   10:29 AM 11/07/2023   10:23 AM  CMP  Glucose 70 - 99 mg/dL 90  898  883   BUN 8 - 27 mg/dL 42  38  15   Creatinine 0.76 - 1.27 mg/dL 8.26  8.12  8.80   Sodium 134 - 144 mmol/L 140  142  141   Potassium 3.5 - 5.2 mmol/L 4.2  4.2  4.0   Chloride 96 - 106 mmol/L 106  107  106   CO2 20 - 29 mmol/L 18  17  17    Calcium  8.6 - 10.2 mg/dL 9.0  89.8  9.6    Lipid Panel     Component Value Date/Time   CHOL 129 11/07/2023 1023   TRIG 90 11/07/2023 1023   HDL 54 11/07/2023 1023   CHOLHDL 2.4 11/07/2023 1023   CHOLHDL 6.6 08/27/2011 0535   VLDL 48 (H) 08/27/2011 0535   LDLCALC 58 11/07/2023 1023    CBC    Component Value Date/Time   WBC 3.9 12/13/2023 1057   WBC 3.6 (L) 12/07/2023 0840   RBC 5.40 12/13/2023 1057   RBC 5.26 12/07/2023 0840   HGB 12.8 (L) 12/13/2023 1057   HCT 42.1 12/13/2023 1057   PLT 139 (L) 12/13/2023 1057   MCV 78 (L) 12/13/2023 1057   MCH 23.7 (L) 12/13/2023 1057   MCH 24.0 (L) 12/07/2023 0840   MCHC 30.4 (L) 12/13/2023 1057   MCHC 32.8 12/07/2023 0840   RDW 14.7 12/13/2023 1057   LYMPHSABS 1.2 08/14/2023 0901   MONOABS 0.3 07/21/2023 1004   EOSABS 0.3 08/14/2023 0901   BASOSABS 0.0 08/14/2023 0901    ASSESSMENT AND PLAN: 1. Essential hypertension (Primary) At goal. Continue carvedilol  25 mg daily, chlorthalidone  50 mg daily, BiDil  20/37.5 mg 3 times a day, Entresto  97/103 mg twice a day, amlodipine  5 mg daily  -  amLODipine  (NORVASC ) 5 MG tablet; Take 1 tablet (5 mg total) by mouth daily.  Dispense: 90 tablet; Refill: 1 - carvedilol  (COREG ) 25 MG tablet; Take 1 tablet (25 mg total) by mouth 2 (two) times daily with a meal.  Dispense: 180 tablet; Refill: 1 - chlorthalidone  (HYGROTON ) 50 MG tablet; Take 1 tablet (50 mg total) by mouth daily.  Dispense: 90 tablet; Refill: 1  2. Chronic systolic congestive heart failure (HCC) Stable  and compensated. Continue Entresto , BiDil , chlorthalidone , carvedilol  as prescribed. Continue furosemide  as needed.   3. OSA (obstructive sleep apnea) Patient has sleep study scheduled for later this month that was ordered by pulmonary.  He plans to keep the appointment.  4. Chronic kidney disease, stage 3b (HCC) 5. Focal segmental glomerulosclerosis Will have our referral coordinator check with [name] to confirm that patient was seen and to have an send thus their notes.  6. Paroxysmal atrial fibrillation (HCC) Patient now in sinus rhythm post ablation.  Per cardiology he is to continue the Eliquis  and amiodarone  for now. - amiodarone  (PACERONE ) 100 MG tablet; Take 1 tablet (100 mg total) by mouth daily.  Dispense: 90 tablet; Refill: 0  7. Overgrown toenails - Ambulatory referral to Podiatry  8. Microcytic anemia Recheck CBC and add iron studies. - CBC - Iron, TIBC and Ferritin Panel  9. Thrombocytopenia This is mild.  Will observe for now with repeat CBC.  10. Dry skin Advise using Eucerin intensive care lotion over-the-counter.  Can also use Vaseline.  11. Need for shingles vaccine Rxn given - Zoster Vaccine Adjuvanted [facility]) injection; Inject 0.5 mLs into the muscle once for 1 dose.  Dispense: 0.5 mL; Refill: 0  12. Need for immunization against influenza - Flu vaccine trivalent PF, 6mos and older(Flulaval,Afluria,Fluarix,Fluzone)     Patient was given the opportunity to ask questions.  Patient verbalized understanding of the plan and was able to repeat key elements of the plan.   This documentation was completed using Paediatric nurse.  Any transcriptional errors are unintentional.  Orders Placed This Encounter  Procedures   Flu vaccine trivalent PF, 6mos and older(Flulaval,Afluria,Fluarix,Fluzone)   CBC   Iron, TIBC and Ferritin Panel   Ambulatory referral to Podiatry     Requested Prescriptions   Signed  Prescriptions Disp Refills   amiodarone  (PACERONE ) 100 MG tablet 90 tablet 0    Sig: Take 1 tablet (100 mg total) by mouth daily.   amLODipine  (NORVASC ) 5 MG tablet 90 tablet 1    Sig: Take 1 tablet (5 mg total) by mouth daily.   carvedilol  (COREG ) 25 MG tablet 180 tablet 1    Sig: Take 1 tablet (25 mg total) by mouth 2 (two) times daily with a meal.   chlorthalidone  (HYGROTON ) 50 MG tablet 90 tablet 1    Sig: Take 1 tablet (50 mg total) by mouth daily.   Zoster Vaccine Adjuvanted (SHINGRIX) injection 0.5 mL 0    Sig: Inject 0.5 mLs into the muscle once for 1 dose.    Return in about 4 months (around 05/21/2024).  Barnie Louder, MD, FACP

## 2024-01-20 ENCOUNTER — Ambulatory Visit: Payer: Self-pay | Admitting: Internal Medicine

## 2024-01-20 LAB — CBC
Hematocrit: 44.8 % (ref 37.5–51.0)
Hemoglobin: 13.9 g/dL (ref 13.0–17.7)
MCH: 24.4 pg — ABNORMAL LOW (ref 26.6–33.0)
MCHC: 31 g/dL — ABNORMAL LOW (ref 31.5–35.7)
MCV: 79 fL (ref 79–97)
Platelets: 130 x10E3/uL — ABNORMAL LOW (ref 150–450)
RBC: 5.69 x10E6/uL (ref 4.14–5.80)
RDW: 14.9 % (ref 11.6–15.4)
WBC: 4.7 x10E3/uL (ref 3.4–10.8)

## 2024-01-20 LAB — IRON,TIBC AND FERRITIN PANEL
Ferritin: 195 ng/mL (ref 30–400)
Iron Saturation: 31 % (ref 15–55)
Iron: 84 ug/dL (ref 38–169)
Total Iron Binding Capacity: 272 ug/dL (ref 250–450)
UIBC: 188 ug/dL (ref 111–343)

## 2024-01-23 ENCOUNTER — Other Ambulatory Visit (HOSPITAL_COMMUNITY): Payer: Self-pay

## 2024-01-23 ENCOUNTER — Other Ambulatory Visit: Payer: Self-pay

## 2024-01-24 ENCOUNTER — Encounter (HOSPITAL_BASED_OUTPATIENT_CLINIC_OR_DEPARTMENT_OTHER): Admitting: Internal Medicine

## 2024-01-24 ENCOUNTER — Encounter (HOSPITAL_BASED_OUTPATIENT_CLINIC_OR_DEPARTMENT_OTHER): Payer: Self-pay

## 2024-01-29 ENCOUNTER — Ambulatory Visit (HOSPITAL_COMMUNITY)
Admit: 2024-01-29 | Discharge: 2024-01-29 | Disposition: A | Discharge status: Home / Self Care | Attending: Physician Assistant | Admitting: Physician Assistant

## 2024-01-29 VITALS — BP 152/86 | HR 65 | Ht 69.0 in | Wt 198.0 lb

## 2024-01-29 DIAGNOSIS — I4819 Other persistent atrial fibrillation: Secondary | ICD-10-CM | POA: Diagnosis not present

## 2024-01-29 DIAGNOSIS — Z5181 Encounter for therapeutic drug level monitoring: Secondary | ICD-10-CM

## 2024-01-29 DIAGNOSIS — Z79899 Other long term (current) drug therapy: Secondary | ICD-10-CM

## 2024-01-29 DIAGNOSIS — I4891 Unspecified atrial fibrillation: Secondary | ICD-10-CM

## 2024-01-29 DIAGNOSIS — D6869 Other thrombophilia: Secondary | ICD-10-CM
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
## 2024-01-31 ENCOUNTER — Other Ambulatory Visit (HOSPITAL_COMMUNITY): Payer: Self-pay

## 2024-01-31 ENCOUNTER — Telehealth: Payer: Self-pay | Admitting: Internal Medicine

## 2024-01-31 DIAGNOSIS — I480 Paroxysmal atrial fibrillation: Secondary | ICD-10-CM

## 2024-01-31 MED ORDER — AMIODARONE HCL 200 MG PO TABS
100.0000 mg | ORAL_TABLET | Freq: Every day | ORAL | 0 refills | Status: DC
  Filled 2024-01-31: qty 30, 60d supply, fill #0

## 2024-01-31 NOTE — Telephone Encounter (Signed)
-----   Message from Lonni LITTIE Nanas sent at 01/28/2024 10:40 PM EDT ----- Regarding: RE: Need for Amiodarone  Let's continue the amiodarone  for now, may discontinue 3 months post ablation Thanks, [place] ----- Message ----- From: Vicci Barnie NOVAK, MD Sent: 01/27/2024   8:05 AM EDT To: Garnette LITTIE Fleeta Tonia, RPH-CPP; Burnard MATSU Godw# Subject: Need for Amiodarone                             This should be fine but let me ask his cardiology team about whether he still needs to be on Amiodarone  or not since he had successful ablation procedure the end of Sept for his a.fib. ----- Message ----- From: Fleeta Tonia Garnette LITTIE, RPH-CPP Sent: 01/23/2024  11:15 AM EDT To: Barnie NOVAK Vicci, MD; Burnard MATSU Lot, CPhT  From a pharmacy standpoint, this substitution should be fine. However, his Cardiology team is usually the one who manages amiodarone . Pulling in Dr. Vicci just to make sure she's okay with this. ----- Message ----- From: Lot Burnard MATSU, CPhT Sent: 01/23/2024  10:14 AM EDT To: Garnette LITTIE Fleeta Tonia, RPH-CPP  Amiodarone  200mg  is preferred on patient's insurance. Can the amiodarone  100mg  be changed to 200mg  1/2 tab qd?

## 2024-02-05 ENCOUNTER — Ambulatory Visit (INDEPENDENT_AMBULATORY_CARE_PROVIDER_SITE_OTHER): Admitting: Podiatry

## 2024-02-05 ENCOUNTER — Encounter: Payer: Self-pay | Admitting: Podiatry

## 2024-02-05 ENCOUNTER — Other Ambulatory Visit: Payer: Self-pay

## 2024-02-05 ENCOUNTER — Other Ambulatory Visit (HOSPITAL_COMMUNITY): Payer: Self-pay

## 2024-02-05 DIAGNOSIS — M79675 Pain in left toe(s): Secondary | ICD-10-CM

## 2024-02-05 DIAGNOSIS — B351 Tinea unguium: Secondary | ICD-10-CM | POA: Diagnosis not present

## 2024-02-05 DIAGNOSIS — Z7901 Long term (current) use of anticoagulants: Secondary | ICD-10-CM | POA: Diagnosis not present

## 2024-02-05 DIAGNOSIS — M79674 Pain in right toe(s): Secondary | ICD-10-CM | POA: Diagnosis not present
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
## 2024-02-07 ENCOUNTER — Telehealth: Payer: Self-pay

## 2024-02-07 NOTE — Telephone Encounter (Signed)
 Please reschedule pt to see Landry in early January to review the sleep study results as this is not scheduled until 03-26-24.

## 2024-02-07 NOTE — Telephone Encounter (Signed)
 Patient has been rescheduled for January.

## 2024-02-07 NOTE — Telephone Encounter (Signed)
 Pt is scheduled to see BW tomorrow for a f/u on the sleep study. The sleep study is not scheduled until 03-26-24.   Beth, okay to reschedule pt?

## 2024-02-07 NOTE — Telephone Encounter (Signed)
 Yes

## 2024-02-08 ENCOUNTER — Ambulatory Visit: Admitting: Primary Care

## 2024-02-14 ENCOUNTER — Other Ambulatory Visit: Payer: Self-pay | Admitting: Internal Medicine

## 2024-02-14 ENCOUNTER — Other Ambulatory Visit (HOSPITAL_COMMUNITY): Payer: Self-pay

## 2024-02-14 DIAGNOSIS — I480 Paroxysmal atrial fibrillation: Secondary | ICD-10-CM

## 2024-02-15 ENCOUNTER — Encounter: Payer: Self-pay | Admitting: Pharmacist

## 2024-02-15 ENCOUNTER — Other Ambulatory Visit: Payer: Self-pay

## 2024-02-19 ENCOUNTER — Other Ambulatory Visit: Payer: Self-pay | Admitting: Internal Medicine

## 2024-02-19 ENCOUNTER — Other Ambulatory Visit (HOSPITAL_COMMUNITY): Payer: Self-pay

## 2024-02-19 ENCOUNTER — Other Ambulatory Visit: Payer: Self-pay

## 2024-02-19 MED ORDER — ALLOPURINOL 100 MG PO TABS
100.0000 mg | ORAL_TABLET | Freq: Every day | ORAL | 1 refills | Status: AC
Start: 2024-02-19 — End: ?
  Filled 2024-02-19: qty 90, 90d supply, fill #0
  Filled 2024-04-15: qty 90, 90d supply, fill #1

## 2024-02-22 ENCOUNTER — Telehealth: Payer: Self-pay

## 2024-02-22 NOTE — Telephone Encounter (Signed)
 Error

## 2024-02-26 ENCOUNTER — Ambulatory Visit: Attending: Internal Medicine

## 2024-02-26 VITALS — Ht 69.0 in | Wt 180.0 lb

## 2024-02-26 DIAGNOSIS — Z0000 Encounter for general adult medical examination without abnormal findings: Secondary | ICD-10-CM
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
## 2024-02-26 NOTE — Patient Instructions (Signed)
 Mr. Westling,  Thank you for taking the time for your Medicare Wellness Visit. I appreciate your continued commitment to your health goals. Please review the care plan we discussed, and feel free to reach out if I can assist you further.  Please note that Annual Wellness Visits do not include a physical exam. Some assessments may be limited, especially if the visit was conducted virtually. If needed, we may recommend an in-person follow-up with your provider.  Ongoing Care Seeing your primary care provider every 3 to 6 months helps us  monitor your health and provide consistent, personalized care.   Referrals If a referral was made during today's visit and you haven't received any updates within two weeks, please contact the referred provider directly to check on the status.  Recommended Screenings:  Health Maintenance  Topic Date Due   Medicare Annual Wellness Visit  09/28/2023   Zoster (Shingles) Vaccine (2 of 2) 03/15/2024   Cologuard (Stool DNA test)  10/04/2026   DTaP/Tdap/Td vaccine (3 - Td or Tdap) 07/25/2027   Pneumococcal Vaccine for age over 32  Completed   Flu Shot  Completed   HIV Screening  Completed   Hepatitis B Vaccine  Aged Out   HPV Vaccine  Aged Out   Meningitis B Vaccine  Aged Out   COVID-19 Vaccine  Discontinued   Hepatitis C Screening  Discontinued       02/26/2024   10:00 AM  Advanced Directives  Does Patient Have a Medical Advance Directive? No  Would patient like information on creating a medical advance directive? No - Patient declined    Vision: Annual vision screenings are recommended for early detection of glaucoma, cataracts, and diabetic retinopathy. These exams can also reveal signs of chronic conditions such as diabetes and high blood pressure.  Dental: Annual dental screenings help detect early signs of oral cancer, gum disease, and other conditions linked to overall health, including heart disease and diabetes.  Please see the attached documents  for additional preventive care recommendations.

## 2024-02-28 ENCOUNTER — Other Ambulatory Visit: Payer: Self-pay | Admitting: Family Medicine

## 2024-02-28 ENCOUNTER — Other Ambulatory Visit (HOSPITAL_COMMUNITY): Payer: Self-pay

## 2024-02-28 ENCOUNTER — Other Ambulatory Visit: Payer: Self-pay

## 2024-02-28 MED ORDER — ISOSORB DINITRATE-HYDRALAZINE 20-37.5 MG PO TABS
2.0000 | ORAL_TABLET | Freq: Three times a day (TID) | ORAL | 1 refills | Status: AC
  Filled 2024-02-28: qty 540, 90d supply, fill #0

## 2024-03-04 ENCOUNTER — Other Ambulatory Visit: Payer: Self-pay | Admitting: Internal Medicine

## 2024-03-04 DIAGNOSIS — I480 Paroxysmal atrial fibrillation: Secondary | ICD-10-CM
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
## 2024-03-08 ENCOUNTER — Other Ambulatory Visit (HOSPITAL_COMMUNITY): Payer: Self-pay

## 2024-03-08 ENCOUNTER — Ambulatory Visit: Attending: Cardiology | Admitting: Cardiology

## 2024-03-08 ENCOUNTER — Encounter: Payer: Self-pay | Admitting: Cardiology

## 2024-03-08 VITALS — BP 122/78 | HR 66 | Ht 69.0 in | Wt 201.8 lb

## 2024-03-08 DIAGNOSIS — Z952 Presence of prosthetic heart valve: Secondary | ICD-10-CM

## 2024-03-08 DIAGNOSIS — I480 Paroxysmal atrial fibrillation: Secondary | ICD-10-CM | POA: Diagnosis not present

## 2024-03-08 DIAGNOSIS — Z79899 Other long term (current) drug therapy: Secondary | ICD-10-CM | POA: Diagnosis not present

## 2024-03-08 DIAGNOSIS — I1A0 Resistant hypertension: Secondary | ICD-10-CM

## 2024-03-08 DIAGNOSIS — I5020 Unspecified systolic (congestive) heart failure: Secondary | ICD-10-CM

## 2024-03-08 DIAGNOSIS — I739 Peripheral vascular disease, unspecified: Secondary | ICD-10-CM

## 2024-03-08 DIAGNOSIS — N1832 Chronic kidney disease, stage 3b: Secondary | ICD-10-CM

## 2024-03-08 LAB — BASIC METABOLIC PANEL WITH GFR
BUN/Creatinine Ratio: 20 (ref 10–24)
BUN: 42 mg/dL — ABNORMAL HIGH (ref 8–27)
CO2: 19 mmol/L — ABNORMAL LOW (ref 20–29)
Calcium: 9.6 mg/dL (ref 8.6–10.2)
Chloride: 105 mmol/L (ref 96–106)
Creatinine, Ser: 2.15 mg/dL — ABNORMAL HIGH (ref 0.76–1.27)
Glucose: 110 mg/dL — ABNORMAL HIGH (ref 70–99)
Potassium: 3.9 mmol/L (ref 3.5–5.2)
Sodium: 139 mmol/L (ref 134–144)
eGFR: 34 mL/min/1.73 — ABNORMAL LOW (ref >59)

## 2024-03-08 MED ORDER — AMIODARONE HCL 200 MG PO TABS
100.0000 mg | ORAL_TABLET | Freq: Every day | ORAL | 0 refills | Status: DC
  Filled 2024-03-08: qty 15, 30d supply, fill #0
  Filled 2024-03-08: qty 30, 60d supply, fill #0

## 2024-03-08 NOTE — Patient Instructions (Signed)
 Medication Instructions:  Your physician recommends that you continue on your current medications as directed. Please refer to the Current Medication list given to you today.  *If you need a refill on your cardiac medications before your next appointment, please call your pharmacy*  Lab Work: Today: BMET   If you have any lab test that is abnormal or we need to change your treatment, we will call you to review the results.  Testing/Procedures: None ordered  Follow-Up: At [facility], you and your health needs are our priority.  As part of our continuing mission to provide you with exceptional heart care, our providers are all part of one team.  This team includes your primary Cardiologist (physician) and Advanced Practice Providers or APPs (Physician Assistants and Nurse Practitioners) who all work together to provide you with the care you need, when you need it.  Your next appointment:   6 month(s)  Provider:   Lonni LITTIE Nanas, MD     Thank you for choosing [facility]!!   [phone]

## 2024-03-10 ENCOUNTER — Other Ambulatory Visit (HOSPITAL_COMMUNITY): Payer: Self-pay

## 2024-03-10 ENCOUNTER — Ambulatory Visit: Payer: Self-pay | Admitting: Cardiology

## 2024-03-12 ENCOUNTER — Other Ambulatory Visit (HOSPITAL_COMMUNITY): Payer: Self-pay

## 2024-03-18 ENCOUNTER — Other Ambulatory Visit: Payer: Self-pay

## 2024-03-18 MED FILL — Amlodipine Besylate Tab 5 MG (Base Equivalent): 5.0000 mg | ORAL | 90 days supply | Qty: 90 | Fill #0 | Status: AC

## 2024-03-26 ENCOUNTER — Ambulatory Visit (HOSPITAL_BASED_OUTPATIENT_CLINIC_OR_DEPARTMENT_OTHER): Attending: Primary Care | Admitting: Internal Medicine

## 2024-03-26 DIAGNOSIS — G4733 Obstructive sleep apnea (adult) (pediatric): Secondary | ICD-10-CM | POA: Diagnosis present

## 2024-03-26 DIAGNOSIS — I493 Ventricular premature depolarization: Secondary | ICD-10-CM | POA: Diagnosis not present

## 2024-03-29 ENCOUNTER — Other Ambulatory Visit: Payer: Self-pay | Admitting: *Deleted

## 2024-03-29 DIAGNOSIS — I10 Essential (primary) hypertension: Secondary | ICD-10-CM

## 2024-03-29 LAB — BASIC METABOLIC PANEL WITH GFR
BUN/Creatinine Ratio: 23 (ref 10–24)
BUN: 49 mg/dL — ABNORMAL HIGH (ref 8–27)
CO2: 18 mmol/L — ABNORMAL LOW (ref 20–29)
Calcium: 10 mg/dL (ref 8.6–10.2)
Chloride: 105 mmol/L (ref 96–106)
Creatinine, Ser: 2.14 mg/dL — ABNORMAL HIGH (ref 0.76–1.27)
Glucose: 109 mg/dL — ABNORMAL HIGH (ref 70–99)
Potassium: 4.1 mmol/L (ref 3.5–5.2)
Sodium: 140 mmol/L (ref 134–144)
eGFR: 34 mL/min/1.73 — ABNORMAL LOW

## 2024-03-29 LAB — MAGNESIUM: Magnesium: 1.6 mg/dL (ref 1.6–2.3)

## 2024-03-31 ENCOUNTER — Ambulatory Visit: Payer: Self-pay | Admitting: Cardiology

## 2024-04-01 ENCOUNTER — Ambulatory Visit (HOSPITAL_COMMUNITY)
Admit: 2024-04-01 | Discharge: 2024-04-01 | Disposition: A | Discharge status: Home / Self Care | Attending: Physician Assistant | Admitting: Physician Assistant

## 2024-04-01 VITALS — BP 130/80 | HR 66 | Ht 69.0 in | Wt 196.2 lb

## 2024-04-01 DIAGNOSIS — I4819 Other persistent atrial fibrillation: Secondary | ICD-10-CM

## 2024-04-01 DIAGNOSIS — I4891 Unspecified atrial fibrillation: Secondary | ICD-10-CM | POA: Diagnosis not present

## 2024-04-01 DIAGNOSIS — D6869 Other thrombophilia: Secondary | ICD-10-CM | POA: Diagnosis not present

## 2024-04-01 NOTE — Patient Instructions (Signed)
 Stop Amiodarone    Follow up with EP in 6 months
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
## 2024-04-05 ENCOUNTER — Encounter: Payer: Self-pay | Admitting: Internal Medicine

## 2024-04-06 ENCOUNTER — Telehealth: Payer: Self-pay | Admitting: Pulmonary Disease

## 2024-04-06 DIAGNOSIS — G4733 Obstructive sleep apnea (adult) (pediatric): Secondary | ICD-10-CM
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
## 2024-04-06 NOTE — Procedures (Addendum)
 " Indications for Polysomnography The patient is a 64 year old Male who is 5' 9 and weighs 200.0 lbs.  His BMI equals 29.6.  A diagnostic polysomnogram was performed to evaluate for -.  After 139.0 minutes of sleep time the patient exhibited sufficient respiratory events qualifying him  for a CPAP trial which was then initiated.  No medications were reported taken during the night.No Data. Polysomnogram Data A full night polysomnogram was performed recording the standard physiologic parameters including EEG, EOG, EMG, EKG, nasal and oral airflow.  Respiratory parameters of chest and abdominal movements are recorded with Piezo-Crystal motion transducers.   Oxygen  saturation was recorded by pulse oximetry.  Sleep Architecture The total recording time of the diagnostic portion of the study was 172.4 minutes.  The total sleep time was 139.0 minutes.  During the diagnostic portion of the study, the patient spent 9.7% of total sleep time in Stage N1, 72.3% in Stage N2, 0.0% in  Stages N3, and 18.0% in REM.   Sleep latency was 23.9 minutes.  REM latency was 66.5 minutes.  Sleep Efficiency was 80.6%.  Wake after Sleep Onset time was 9.5 minutes.  At 12:15:22 AM the patient was placed on PAP treatment and was titrated at pressures ranging from 6/2* cm/H20 with supplemental oxygen  at - up to 23/19/14** cm/H20 with supplemental oxygen  at -.  The total recording time of the treatment portion of the  study was 323.9 minutes.  The total sleep time was 181.0 minutes.  During the treatment portion of the study, the patient spent 12.4% of total sleep time in Stage N1, 67.1% in Stage N2, 0.0% in Stages N3, and 20.4% in REM.   Sleep latency was 7.0  minutes.  REM latency was 34.0 minutes.  Sleep Efficiency was 55.9%.  Wake after Sleep Onset time was 136.0 minutes.  Respiratory Events During the diagnostic portion of the study, the polysomnogram revealed a presence of - obstructive, - central, and - mixed apneas  resulting in an Apnea index of - events per hour.  There were 70 hypopneas (GreaterEqual to3% desaturation and/or arousal)  resulting in an Apnea\Hypopnea Index (AHI GreaterEqual to3% desaturation and/or arousal) of 30.2 events per hour.  There were 36 hypopneas (GreaterEqual to4% desaturation) resulting in an Apnea\Hypopnea Index (AHI GreaterEqual to4% desaturation) of 15.5  events per hour.  There were 37 Respiratory Effort Related Arousals resulting in a RERA index of 16.0 events per hour. The Respiratory Disturbance Index is 46.2 events per hour.  The snore index was - events per hour.  Mean oxygen  saturation was 89.7%.   The lowest oxygen  saturation during sleep was 83.0%.  Time spent LessEqual to88% oxygen  saturation was  minutes ().  During the treatment portion of the study, the polysomnogram revealed a presence of 2 obstructive, 13 central, and - mixed apneas resulting in an Apnea index of 5.0 events per hour.  There were 75 hypopneas (GreaterEqual to3% desaturation and/or arousal)  resulting in an Apnea\Hypopnea Index (AHI GreaterEqual to3% desaturation and/or arousal) of 29.8 events per hour.  There were 25 hypopneas (GreaterEqual to4% desaturation) resulting in an Apnea\Hypopnea Index (AHI GreaterEqual to4% desaturation) of 13.3  events per hour.  There were 33 Respiratory Effort Related Arousals resulting in a RERA index of 10.9 events per hour. The Respiratory Disturbance Index is 40.8 events per hour.  The snore index was - events per hour.  Mean oxygen  saturation was 93.2%.   The lowest oxygen  saturation during sleep was 88.0%.  Time spent LessEqual to88%  oxygen  saturation was  minutes ().  Limb Activity During the diagnostic portion of the study, there were 4 limb movements recorded.  Of this total, 4 were classified as PLMs.  Of the PLMs, - were associated with arousals.  The Limb Movement index was 1.7 per hour while the PLM index was 1.7 per hour.  During the treatment portion of the  study, there were - limb movements recorded.  Of this total, - were classified as PLMs.  Of the PLMs, - were associated with arousals.  The Limb Movement index was - per hour while the PLM index was - per hour.  Cardiac Summary During the diagnostic portion of the study, the average pulse rate was 71.5 bpm.  The minimum pulse rate was 66.0 bpm while the maximum pulse rate was 78.0 bpm.  During the treatment portion of the study, the average pulse rate was 62.5 bpm.  The minimum pulse rate was 51.0 bpm while the maximum pulse rate was 75.0 bpm.  Comments : Patient had a split-night study performed Titration was initiated with CPAP, titrated to BiPAP.  Notably did not have a significant amount of sleep following transitioning to BiPAP  Diagnosis: Severe obstructive sleep apnea with AHI of 30.2, O2 nadir of 83% Titrated to CPAP and switch over to BiPAP with limited sleep once switched over to BiPAP. Sleep efficiency was poor with fragmentation Auto CPAP with close clinical follow-up will be most appropriate No significant periodic limb movement Cardiac rhythm was sinus with PVCs noted   Recommendations: Auto CPAP 10-20 with an EPR of 2 Patient used a large size Banker Evora fullface mask  Avoid alcohol, sedatives and other CNS depressants that may worsen sleep apnea and disrupt normal sleep architecture. Sleep hygiene should be reviewed to assess factors that may improve sleep quality. Weight management and regular exercise should be initiated or continued  Follow-up in 4 to 6 weeks following initiation of CPAP therapy for optimization of therapy   This study was personally reviewed and electronically signed by: GLENWOOD Jennet Epley, MD Accredited Board Certified in Sleep Medicine Date/Time:  04/06/24 "

## 2024-04-06 NOTE — Telephone Encounter (Signed)
 Call patient  Sleep study result  Date of study: 03/26/2024  Impression: Severe obstructive sleep apnea with AHI of 30.2 with O2 nadir of 83% Fragmented sleep   Recommendation: Auto CPAP 10-20 with an EPR of 2 Patient used a large size [employer]video fullface mask  Avoid alcohol, sedatives and other CNS depressants that may worsen sleep apnea and disrupt normal sleep architecture. Sleep hygiene should be reviewed to assess factors that may improve sleep quality. Weight management and regular exercise should be initiated or continued  Follow-up in 4 to 6 weeks following initiation of CPAP therapy for optimization of therapy

## 2024-04-06 NOTE — Procedures (Signed)
 [facility] [address] [place], [place] [address] Tel: [phone]   Fax: [phone]  Split Night Interpretation  Patient Name:  KEMPER, HEUPEL Date:  03/26/2024 Referring Physician:  ALMARIE FERRARI [phone]) %%startinterp%% Indications for Polysomnography The patient is a 64 year old Male who is 5' 9 and weighs 200.0 lbs.  His BMI equals 29.6.  A diagnostic polysomnogram was performed to evaluate for -.  After 139.0 minutes of sleep time the patient exhibited sufficient respiratory events qualifying him for a CPAP trial which was then initiated.    No medications were reported taken during the night.  No Data.   Polysomnogram Data A full night polysomnogram was performed recording the standard physiologic parameters including EEG, EOG, EMG, EKG, nasal and oral airflow.  Respiratory parameters of chest and abdominal movements are recorded with Piezo-Crystal motion transducers.  Oxygen  saturation was recorded by pulse oximetry.    Sleep Architecture The total recording time of the diagnostic portion of the study was 172.4 minutes.  The total sleep time was 139.0 minutes.  During the diagnostic portion of the study, the patient spent 9.7% of total sleep time in Stage N1, 72.3% in Stage N2, 0.0% in Stages N3, and 18.0% in REM.   Sleep latency was 23.9 minutes.  REM latency was 66.5 minutes.  Sleep Efficiency was 80.6%.  Wake after Sleep Onset time was 9.5 minutes.   At 12:15:22 AM the patient was placed on PAP treatment and was titrated at pressures ranging from 6/2* cm/H20 with supplemental oxygen  at - up to 23/19/14** cm/H20 with supplemental oxygen  at -.  The total recording time of the treatment portion of the study was 323.9 minutes.  The total sleep time was 181.0 minutes.  During the treatment portion of the study, the patient spent 12.4% of total sleep time in Stage N1, 67.1% in Stage N2, 0.0% in Stages N3, and 20.4% in REM.   Sleep latency  was 7.0 minutes.  REM latency was 34.0 minutes.  Sleep Efficiency was 55.9%.  Wake after Sleep Onset time was 136.0 minutes.  Respiratory Events During the diagnostic portion of the study, the polysomnogram revealed a presence of - obstructive, - central, and - mixed apneas resulting in an Apnea index of - events per hour.  There were 70 hypopneas (>=3% desaturation and/or arousal) resulting in an Apnea\Hypopnea Index (AHI >=3% desaturation and/or arousal) of 30.2 events per hour.  There were 36 hypopneas (>=4% desaturation) resulting in an Apnea\Hypopnea Index (AHI >=4% desaturation) of 15.5 events per hour.  There were 37 Respiratory Effort Related Arousals resulting in a RERA index of 16.0 events per hour. The Respiratory Disturbance Index is 46.2 events per hour.  The snore index was - events per hour.  Mean oxygen  saturation was 89.7%.  The lowest oxygen  saturation during sleep was 83.0%.  Time spent <=88% oxygen  saturation was 35.1 minutes (20.4%).  During the treatment portion of the study, the polysomnogram revealed a presence of 2 obstructive, 13 central, and - mixed apneas resulting in an Apnea index of 5.0 events per hour.  There were 75 hypopneas (>=3% desaturation and/or arousal) resulting in an Apnea\Hypopnea Index (AHI >=3% desaturation and/or arousal) of 29.8 events per hour.  There were 25 hypopneas (>=4% desaturation) resulting in an Apnea\Hypopnea Index (AHI >=4% desaturation) of 13.3 events per hour.  There were 33 Respiratory Effort Related Arousals resulting in a RERA index of 10.9 events per hour. The Respiratory Disturbance Index is 40.8 events per hour.  The snore index was - events per hour.  Mean oxygen  saturation was 93.2%.  The lowest oxygen  saturation during sleep was 88.0%.  Time spent <=88% oxygen  saturation was 0.8 minutes (0.3%).  Limb Activity During the diagnostic portion of the study, there were 4 limb movements recorded.  Of this total, 4 were classified as PLMs.  Of the  PLMs, - were associated with arousals.  The Limb Movement index was 1.7 per hour while the PLM index was 1.7 per hour.  During the treatment portion of the study, there were - limb movements recorded.  Of this total, - were classified as PLMs.  Of the PLMs, - were associated with arousals.  The Limb Movement index was - per hour while the PLM index was - per hour.  Cardiac Summary During the diagnostic portion of the study, the average pulse rate was 71.5 bpm.  The minimum pulse rate was 66.0 bpm while the maximum pulse rate was 78.0 bpm.  During the treatment portion of the study, the average pulse rate was 62.5 bpm.  The minimum pulse rate was 51.0 bpm while the maximum pulse rate was 75.0 bpm.   Comments : Patient had a split-night study performed Titration was initiated with CPAP, titrated to BiPAP.  Notably did not have a significant amount of sleep following transitioning to BiPAP  Diagnosis:  Severe obstructive sleep apnea with AHI of 30.2, O2 nadir of 83% Titrated to CPAP and switch over to BiPAP with limited sleep once switched over to BiPAP. Sleep efficiency was poor with fragmentation Auto CPAP with close clinical follow-up will be most appropriate No significant periodic limb movement Cardiac rhythm was sinus with PVCs noted   Recommendations: Auto CPAP 10-20 with an EPR of 2 Patient used a large size Banker Evora fullface mask  Avoid alcohol, sedatives and other CNS depressants that may worsen sleep apnea and disrupt normal sleep architecture. Sleep hygiene should be reviewed to assess factors that may improve sleep quality. Weight management and regular exercise should be initiated or continued  Follow-up in 4 to 6 weeks following initiation of CPAP therapy for optimization of therapy   This study was personally reviewed and electronically signed by: GLENWOOD Jennet Epley, MD Accredited Board Certified in Sleep Medicine Date/Time:    04/06/24  %%endinterp%%  Split Night Report  Patient Name: STEVENS, MAGWOOD Study Date: 03/26/2024  Date of Birth: 1960-06-24 Study Type: Split Night  Age: 21 year MRN #: [id number]  Sex: Male Interpreting Physician: NEYSA RAMA, [id number]  Height: 5' 9 Referring Physician: ALMARIE FERRARI [phone])  Weight: 200.0 lbs Recording Tech: Orie Sires RRT RPSGT RST  BMI: 29.6 Scoring Tech: Orie Sires RRT RPSGT RST  ESS: 5 Neck Size: 15.5  Mask Type [employer] Paykel Evora Full Final Pressure: 23/19 CMH2O WITH A BACK UP RATE OF 14  Mask Size: Large Supplemental O2: -   Study Overview  DIAGNOSTIC TREATMENT  Lights Off: 09:22:40 PM Lights Off: 12:15:02 AM  Lights On: 12:15:02 AM Lights On: 05:38:59 AM  Time in Bed: 172.4 min. Time in Bed: 323.9 min.  Total Sleep Time: 139.0 min. Total Sleep Time: 181.0 min.  Sleep Efficiency: 80.6% Sleep Efficiency: 55.9%  Sleep Latency: 23.9 min. Sleep Latency: 7.0 min.  REM Latency from Sleep Onset: 66.5 min. REM Latency from Sleep Onset: 34.0 min.  Wake After Sleep Onset: 9.5 min. Wake After Sleep Onset: 136.0 min.   DIAGNOSTIC TREATMENT   Count Index  Count Index  Awakenings: 10 4.3  Awakenings: 20 6.6  Arousals: 76 32.8 Arousals: 73 24.2  AHI (>=3% Desat and/or Ar.): 70 30.2 AHI (>=3% Desat and/or Ar.): 90 29.8  AHI (>=4% Desat): 36 15.5 AHI (>=4% Desat): 40 13.3   Limb Movements: 4 1.7 Limb Movements: - -  Snore: - - Snore: - -  Desaturations: 73 31.5 Desaturations: 98 32.5  Minimum SpO2 TST: 83.0% Minimum SpO2 TST: 88.0%    Sleep Architecture   DIAGNOSTIC TREATMENT ENTIRE NIGHT  Stages Time (mins) % Sleep Time Time (mins) % Sleep Time Time (mins) % Sleep Time  Wake 33.5  143.0  176.5   Stage N1 13.5 9.7% 22.5 12.4% 36.0 11.3%  Stage N2 100.5 72.3% 121.5 67.1% 222.0 69.4%  Stage N3 0.0 0.0% 0.0 0.0% 0.0 0.0%  REM 25.0 18.0% 37.0 20.4% 62.0 19.4%   Arousal Summary   DIAGNOSTIC TREATMENT   NREM REM TST Index NREM REM TST Index   Respiratory Ar. 40 1 41 17.7 44 - 44 14.6  PLM Ar. - - - - - - - -  Isolated Limb Movement Ar. - - - - - - - -  Snore Ar. - - - - - - - -  Spontaneous Ar. 33 2 35 15.1 28 1 29  9.6  Total Ar. 73 3 76 32.8 72 1 73 24.2    Respiratory Summary  DIAGNOSTIC By Sleep Stage By Body Position Total   NREM REM Supine Non-Supine   Time (min) 114.0 25.0 139.0 - 139.0         Obstructive Apnea - - - - -  Mixed Apnea - - - - -  Central Apnea - - - - -  Total Apneas - - - - -  Total Apnea Index - - - - -         Hypopneas (>=3% Desat and/or Ar.) 55 15 70 - 70  AHI (>=3% Desat and/or Ar.) 28.9 36.0 30.2 - 30.2         Hypopneas (>=4% Desat) 32 4 36 - 36  AHI (>=4% Desat) 16.8 9.6 15.5 - 15.5          RERAs 36 1 37 - 37  RERA Index 18.9 2.4 16.0 - 16.0         RDI 47.9 38.4 46.2 - 46.2    Respiratory Event Type Index  Central Apneas -  Obstructive Apneas -  Mixed Apneas -  Central Hypopneas -  Obstructive Hypopneas 30.2  Central Apnea + Hypopnea (CAHI) -  Obstructive Apnea + Hypopnea (OAHI) 30.2    TREATMENT By Sleep Stage By Body Position Total   NREM REM Supine Non-Supine   Time (min) 144.0 37.0 - 181.0 181.0         Obstructive Apnea 2 - - 2 2  Mixed Apnea - - - - -  Central Apnea 13 - - 13 13  Total Apneas 15 - - 15 15  Total Apnea Index 6.3 - - 5.0 5.0         Hypopneas (>=3% Desat and/or Ar.) 71 4 - 75 75  AHI (>=3% Desat and/or Ar.) 35.8 6.5 - 29.8 29.8         Hypopneas (>=4% Desat) 25 - - 25 25  AHI (>=4% Desat) 16.7 - - 13.3 13.3          RERAs 33 - - 33 33  RERA Index 13.8 - - 10.9 10.9         RDI 49.6 6.5 - 40.8  40.8    Respiratory Event Type Index  Central Apneas 4.3  Obstructive Apneas 0.7  Mixed Apneas -  Central Hypopneas 0.3  Obstructive Hypopneas 24.5  Central Apnea + Hypopnea (CAHI) 4.6  Obstructive Apnea + Hypopnea (OAHI) 25.2    Respiratory Event Durations   DIAGNOSTIC TREATMENT  Apnea NREM REM NREM REM  Average (seconds) - - 17.5 -   Maximum (seconds) - - 21.5 -  Hypopnea      Average (seconds) 25.8 44.8 24.7 32.3  Maximum (seconds) 45.7 81.0 35.6 51.4    Limb Movement Summary   DIAGNOSTIC TREATMENT   Count Index Count Index  Isolated Limb Movements - - - -  Periodic Limb Movements (PLMs) 4 1.7 - -  Total Limb Movements 4 1.7 - -    Oxygen  Saturation Summary   DIAGNOSTIC TREATMENT   Wake NREM REM TST Wake NREM REM TST  Average SpO2 90.4% 89.6% 89.1% 89.5% 94.2% 92.7% 91.6% 92.5%  Minimum SpO2 87.0% 86.0% 83.0% 83.0%  87.0% 88.0% 89.0% 88.0%   Maximum SpO2 95.0% 93.0% 92.0% 93.0%  98.0% 96.0% 93.0% 96.0%    DIAGNOSTIC Oxygen  Saturation Distribution  Range (%) Time in range (min) Time in range (%)   90.0 - 100.0 45.0 26.1%  80.0 - 90.0 127.5 73.9%  70.0 - 80.0 - -  60.0 - 70.0 - -  50.0 - 60.0 - -  0.0 - 50.0 - -  Time Spent <=88% SpO2  Range (%) Time in range (min) Time in range (%)  0.0 - 88.0 35.1 20.4%      Count Index  Desaturations: 73 31.5   TREATMENT Oxygen  Saturation Distribution  Range (%) Time in range (min) Time in range (%)   90.0 - 100.0 302.3 94.6%  80.0 - 90.0 17.1 5.4%  70.0 - 80.0 - -  60.0 - 70.0 - -  50.0 - 60.0 - -  0.0 - 50.0 - -  Time Spent <=88% SpO2  Range (%) Time in range (min) Time in range (%)  0.0 - 88.0 0.8 0.3%      Count Index  Desaturations: 98 32.5    Cardiac Summary   DIAGNOSTIC TREATMENT   Wake NREM REM Total Wake NREM REM Total  Average Pulse Rate (BPM) 71.5 71.6 71.3 71.5 59.2 64.5 67.0 62.5  Minimum Pulse Rate (BPM) 66.0 68.0 68.0 66.0 51.0 56.0 62.0 51.0  Maximum Pulse Rate (BPM) 78.0 76.0 74.0 78.0 75.0 72.0 70.0 75.0   Pulse Rate Distribution   DIAGNOSTIC  Range (bpm) Time in range (min) Time in range (%)  0.0 - 40.0 - -  40.0 - 60.0 - -  60.0 - 80.0 172.5 100.0%  80.0 - 100.0 - -  100.0 - 120.0 - -  120.0 - 140.0 - -  140.0 - 200.0 - -   TREATMENT  Range (bpm) Time in range (min) Time in range (%)  0.0 - 40.0 - -   40.0 - 60.0 122.6 38.4%  60.0 - 80.0 196.4 61.5%  80.0 - 100.0 - -  100.0 - 120.0 - -  120.0 - 140.0 - -  140.0 - 200.0 - -    Titration Summary  PAP Device PAP Level O2 Level Time (min) Wake (min) NREM (min) REM (min) Supine TST (min) Sleep Eff% OA# CA# MA# Hyp# (>=3%) AHI (>=3%) Hyp# (>=4%) AHI (>=%4) RERA RDI SpO2 <=88% (min) Min SpO2 Mean SpO2 Ar. Index  - Off - 172.5 33.5 114.0 25.0  139.0 80.6% - - -  70 30.2 36  15.5 37  46.2  33.8 83.0 89.5 32.8  CPAP EPR 6/2 - 27.5 10.5 17.0 0.0  0.0 61.8% - - - 20 70.6 11  38.8 5  88.2  0.5 88.0 90.9 70.6  CPAP EPR 8/2 - 10.0 0.0 10.0 0.0  100.0% - - - 14 84.0 8  48.0 -  84.0  0.0 88.0 91.3 24.0  CPAP EPR 10/2 - 16.0 0.0 3.5 12.5  100.0% - - - 7 26.3 -  - 1  30.0  0.1 88.0 91.0 15.0  CPAP EPR 12/2 - 31.5 1.0 6.0 24.5  96.8% - - - 7 13.8 2  3.9 1  15.7  0.0 90.0 92.0 3.9  CPAP EPR 14/2 - 15.0 0.0 15.0 0.0  100.0% - 1 - 9 40.0 2  12.0 4  56.0  0.0 90.0 92.4 16.0  CPAP EPR 16/2 - 42.5 1.0 41.5 0.0  97.6% - - - 8 11.6 -  - 12  28.9  0.0 91.0 92.6 27.5  CPAP EPR 18/2 - 114.0 99.0 15.0 0.0  13.2% - 1 - 10 44.0 2  12.0 8  76.0  0.0 90.0 93.8 56.0  Bilevel 22/18/0 - 39.0 30.5 8.5 0.0  21.8% 2 10 - - 84.7 -  84.7 -  84.7  0.0 92.0 93.9 28.2  Bilevel 23/19/14 - 28.5 1.0 27.5 0.0  96.5% - 1 - - 2.2 -  2.2 2  6.5  0.0 92.0 94.0 4.4    Hypnograms                           Technologist Comments  Patient was ordered as a Copy. Patient is a 64 year old male who was sent to the sleep center for OSA. Patient met split night criteria per standing protocol. Patient was placed on CPAP of 6 CMH2O with 2 CMH2O of EPR at 9:22 pm and was increased to a Bilevel pressure of 23/19 CMH2O with a backup rate of 14. There was with no audible snoring noted on a pressure setting of 23/19 CMH2O with a backup rate of 14. Patient was increased for respiratory events with and without a 4% Desat or arousal, audible snoring, and for patient's  comfort with increased pressure needs. Patient tolerated CPAP/ Bilevel trial fairly well, but patient had difficulty obtaining and maintaining sleep once patient awaken to use the restroom in early am. Also, patient's phone went off causing his to awaken during the night. No oxygen  was applied. No medications were reported taken. A few PLM's/PLMA's were noted. Patient's study was done in room # 4. Questionable cardiac arrhythmias were noted see epochs for examples: 102, 135, 196, 206, 207, 249, 250, 254, 269, 297, 352, 384, etc. One restroom visit was noted. Patient was fitted with a [employer] Evora Full nasal/oral full-face mask size large with heated humidification.

## 2024-04-08 ENCOUNTER — Ambulatory Visit: Admitting: Cardiology

## 2024-04-08 ENCOUNTER — Ambulatory Visit: Payer: Self-pay | Admitting: Primary Care

## 2024-04-08 NOTE — Telephone Encounter (Signed)
 Please let patient know sleep study showed severe obstructive sleep apnea with AHI of 30.2, O2 nadir of 83%  Recommending patient be started on CPAP. Auto CPAP 10-20 with an EPR of 2. Size large [employer] Evora fullface mask  He needs to wear nightly while sleeping   Will need follow-up in 6-8 weeks for compliance check

## 2024-04-12 NOTE — Telephone Encounter (Signed)
ATC x1.  LMTCB. 

## 2024-04-15 ENCOUNTER — Other Ambulatory Visit (HOSPITAL_BASED_OUTPATIENT_CLINIC_OR_DEPARTMENT_OTHER): Payer: Self-pay

## 2024-04-15 ENCOUNTER — Other Ambulatory Visit (HOSPITAL_COMMUNITY): Payer: Self-pay

## 2024-04-15 ENCOUNTER — Other Ambulatory Visit: Payer: Self-pay

## 2024-04-15 NOTE — Telephone Encounter (Signed)
 Called and spoke with patient, provided results/recommendations per Dr. Neda.  He verbalized understanding and was agreeable to me placing the order for the CPAP machine.  He wanted to keep the appointment for 04/16/24 as well.  Order placed for the CPAP machine.  Nothing further needed.

## 2024-04-16 ENCOUNTER — Encounter: Payer: Self-pay | Admitting: Primary Care

## 2024-04-16 ENCOUNTER — Ambulatory Visit: Admitting: Primary Care

## 2024-04-16 VITALS — BP 124/66 | HR 69 | Temp 97.7°F | Ht 69.0 in | Wt 199.4 lb

## 2024-04-16 DIAGNOSIS — G4733 Obstructive sleep apnea (adult) (pediatric): Secondary | ICD-10-CM
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
## 2024-04-16 NOTE — Patient Instructions (Addendum)
 " YOUR PLAN: -OBSTRUCTIVE SLEEP APNEA: Obstructive sleep apnea is a condition where the airway becomes blocked during sleep, causing breathing to stop and start repeatedly. Your recent sleep study confirmed severe sleep apnea with an average of 30 apneic events per hour and oxygen  desaturation to 83%. We have ordered a CPAP machine with pressure settings of 10 to 20 and a large full face mask for you. You should wear the CPAP nightly for a minimum of 4 to 6 hours. We will schedule a follow-up 31 to 90 days after you start using the CPAP to check your compliance, and an in-person visit is preferred. For CPAP maintenance, change the mask cushion and filter monthly, tubing every 3 months, and headgear and water chamber every 6 months. Use distilled water for humidification or turn off the humidification if distilled water is unavailable. The CPAP settings will auto-adjust throughout the night.  Recommendations: Start wearing CPAP nightly minium 4-6 hours  Orders: Ordered Auto CPAP 10-20cm h20  Follow-up: 2 months with Beth NP or sooner if needed    CPAP and BIPAP Information CPAP and BIPAP use air pressure to keep your airways open and help you breathe well. CPAP and BIPAP use different amounts of pressure. Your health care provider will tell you whether CPAP or BIPAP would be best for you. CPAP stands for continuous positive airway pressure. With CPAP, the amount of pressure stays the same while you breathe in and out. BIPAP stands for bi-level positive airway pressure. With BIPAP, the amount of pressure will be higher when you breathe in and lower when you breathe out. This allows you to take bigger breaths. CPAP or BIPAP may be used in the hospital or at home. You may need to have a sleep study before your provider can order a device for you to use at home. What are the advantages? CPAP and BIPAP are most often used for obstructive sleep apnea to keep the airways from collapsing when the muscles  relax during sleep. CPAP or BIPAP can be used if you have: Chronic obstructive pulmonary disease. Heart failure. Medical conditions that cause muscle weakness. Other problems that cause breathing to be shallow, weak, or difficult. What are the risks? Your provider will talk with you about risks. These may include: Sores on your nose or face caused from the mask, prongs, or nasal pillows. Dry or stuffy nose or nosebleeds. Feeling gassy or bloated. Sinus or lung infection if the equipment is not cleaned well. When should CPAP or BIPAP be used? In most cases, CPAP or BIPAP is used during sleep at night or whenever the main sleep time happens. It's also used during naps. People with some medical conditions may need to wear the mask when they're awake. Follow instructions from your provider about when to use your CPAP or BIPAP. What happens during CPAP or BIPAP?  Both CPAP and BIPAP use a small machine that uses electricity to create air pressure. A long tube connects the device to a plastic mask. Air is blown through the mask into your nose or mouth. The amount of pressure that's used to blow the air can be adjusted. Your provider will set the pressure setting and help you find the best mask for you. Tips for using the mask There are different types and sizes of masks. If your mask does not fit well, talk with your provider about getting a different one. Some common types of masks include: Full face masks, which fit over the mouth and nose.  Nasal masks, which fit over the nose. Nasal pillow or prong masks, which fit into the nostrils. The mask needs to be snug to your face, so some people feel trapped or closed in at first. If you feel this way, you may need to get used to the mask. Hold the mask loosely over your nose or mouth and then gradually put the the mask on more snugly. Slowly increase the amount of time you use the mask. If you have trouble with your mask not fitting well or leaking,  talk with your provider. Do not stop using the mask. Tips for using the device Follow instructions from your provider about how to and how often to use the device. For home use, CPAP and BIPAP devices come from home health care companies. There are many different brands. Your health insurance company will help to decide which device you get. Keep the CPAP or BIPAP device and attachments clean. Ask your home health care company or check the instruction book for cleaning instructions. Make sure the humidifier is filled with germ-free (sterile) water and is working correctly. This will help prevent a dry or stuffy nose or nosebleeds. A nasal saline mist or spray may keep your nose from getting dry and sore. Do not eat or drink while the CPAP or BIPAP device is on. Food or drinks could get pushed into your lungs by the pressure of the CPAP or BIPAP. Follow these instructions at home: Take over-the-counter and prescription medicines only as told by your provider. Do not smoke, vape, or use nicotine or tobacco. Contact a health care provider if: You have redness or pressure sores on your head, face, mouth, or nose from the mask or headgear. You have trouble using the CPAP or BIPAP device. You have trouble going to sleep or staying asleep. Someone tells you that you snore even when wearing your CPAP or BIPAP device. Get help right away if: You have trouble breathing. You feel confused. These symptoms may be an emergency. Get help right away. Call 911. Do not wait to see if the symptoms will go away. Do not drive yourself to the hospital. This information is not intended to replace advice given to you by your health care provider. Make sure you discuss any questions you have with your health care provider. Document Revised: 07/13/2022 Document Reviewed: 07/13/2022 [employer]vier Patient Education  2024 [employer]. "

## 2024-04-18 NOTE — Progress Notes (Signed)
 I called and spoke to pt. Pt informed of Beth's note and verbalized understanding. CPAP was already ordered and pt is already scheduled to see [facility] in March 2026. NFN

## 2024-04-29 ENCOUNTER — Other Ambulatory Visit (HOSPITAL_COMMUNITY): Payer: Self-pay

## 2024-04-29 ENCOUNTER — Other Ambulatory Visit: Payer: Self-pay | Admitting: Family Medicine

## 2024-04-29 ENCOUNTER — Other Ambulatory Visit: Payer: Self-pay

## 2024-04-29 MED ORDER — SACUBITRIL-VALSARTAN 97-103 MG PO TABS
1.0000 | ORAL_TABLET | Freq: Two times a day (BID) | ORAL | 1 refills | Status: AC
  Filled 2024-04-29: qty 60, 30d supply, fill #0

## 2024-05-06 ENCOUNTER — Ambulatory Visit: Admitting: Podiatry

## 2024-05-21 ENCOUNTER — Ambulatory Visit: Admitting: Internal Medicine

## 2024-05-22 ENCOUNTER — Ambulatory Visit: Admitting: Podiatry

## 2024-06-17 ENCOUNTER — Ambulatory Visit: Admitting: Primary Care
